# Patient Record
Sex: Male | Born: 1955
Health system: Southern US, Community
[De-identification: ages and names within clinical notes are randomized; demographics above are authoritative.]

## PROBLEM LIST (undated history)

## (undated) ENCOUNTER — Emergency Department (HOSPITAL_COMMUNITY): Admission: EM | Payer: Medicaid Other

## (undated) DIAGNOSIS — M545 Low back pain, unspecified: Secondary | ICD-10-CM

## (undated) DIAGNOSIS — R011 Cardiac murmur, unspecified: Secondary | ICD-10-CM

## (undated) DIAGNOSIS — R51 Headache: Secondary | ICD-10-CM

## (undated) DIAGNOSIS — I739 Peripheral vascular disease, unspecified: Secondary | ICD-10-CM

## (undated) DIAGNOSIS — I169 Hypertensive crisis, unspecified: Secondary | ICD-10-CM

## (undated) DIAGNOSIS — I10 Essential (primary) hypertension: Secondary | ICD-10-CM

## (undated) DIAGNOSIS — R519 Headache, unspecified: Secondary | ICD-10-CM

## (undated) DIAGNOSIS — F1910 Other psychoactive substance abuse, uncomplicated: Secondary | ICD-10-CM

## (undated) DIAGNOSIS — G4733 Obstructive sleep apnea (adult) (pediatric): Secondary | ICD-10-CM

## (undated) DIAGNOSIS — N183 Chronic kidney disease, stage 3 unspecified: Secondary | ICD-10-CM

## (undated) DIAGNOSIS — G8929 Other chronic pain: Secondary | ICD-10-CM

## (undated) DIAGNOSIS — B20 Human immunodeficiency virus [HIV] disease: Secondary | ICD-10-CM

## (undated) DIAGNOSIS — R0600 Dyspnea, unspecified: Secondary | ICD-10-CM

## (undated) DIAGNOSIS — I480 Paroxysmal atrial fibrillation: Secondary | ICD-10-CM

## (undated) DIAGNOSIS — Z9989 Dependence on other enabling machines and devices: Secondary | ICD-10-CM

## (undated) DIAGNOSIS — M109 Gout, unspecified: Secondary | ICD-10-CM

## (undated) DIAGNOSIS — J449 Chronic obstructive pulmonary disease, unspecified: Secondary | ICD-10-CM

## (undated) DIAGNOSIS — I5032 Chronic diastolic (congestive) heart failure: Secondary | ICD-10-CM

## (undated) HISTORY — DX: Human immunodeficiency virus (HIV) disease: B20

## (undated) HISTORY — DX: Paroxysmal atrial fibrillation: I48.0

## (undated) HISTORY — DX: Chronic diastolic (congestive) heart failure: I50.32

## (undated) HISTORY — DX: Hypertensive crisis, unspecified: I16.9

---

## 2001-09-29 ENCOUNTER — Emergency Department (HOSPITAL_COMMUNITY): Admission: EM | Admit: 2001-09-29 | Discharge: 2001-09-29 | Payer: Self-pay | Admitting: Emergency Medicine

## 2001-10-07 ENCOUNTER — Emergency Department (HOSPITAL_COMMUNITY): Admission: EM | Admit: 2001-10-07 | Discharge: 2001-10-07 | Payer: Self-pay | Admitting: Emergency Medicine

## 2001-10-10 ENCOUNTER — Encounter: Admission: RE | Admit: 2001-10-10 | Discharge: 2001-10-10 | Payer: Self-pay | Admitting: Internal Medicine

## 2001-11-19 ENCOUNTER — Emergency Department (HOSPITAL_COMMUNITY): Admission: EM | Admit: 2001-11-19 | Discharge: 2001-11-19 | Payer: Self-pay

## 2001-11-22 ENCOUNTER — Emergency Department (HOSPITAL_COMMUNITY): Admission: EM | Admit: 2001-11-22 | Discharge: 2001-11-22 | Payer: Self-pay | Admitting: Emergency Medicine

## 2001-11-24 ENCOUNTER — Emergency Department (HOSPITAL_COMMUNITY): Admission: EM | Admit: 2001-11-24 | Discharge: 2001-11-25 | Payer: Self-pay | Admitting: Emergency Medicine

## 2001-11-25 ENCOUNTER — Encounter: Payer: Self-pay | Admitting: Emergency Medicine

## 2002-01-15 ENCOUNTER — Encounter: Payer: Self-pay | Admitting: Gastroenterology

## 2002-01-15 ENCOUNTER — Ambulatory Visit (HOSPITAL_COMMUNITY): Admission: RE | Admit: 2002-01-15 | Discharge: 2002-01-15 | Payer: Self-pay | Admitting: Gastroenterology

## 2002-09-09 ENCOUNTER — Emergency Department (HOSPITAL_COMMUNITY): Admission: EM | Admit: 2002-09-09 | Discharge: 2002-09-09 | Payer: Self-pay | Admitting: Emergency Medicine

## 2003-09-04 ENCOUNTER — Emergency Department (HOSPITAL_COMMUNITY): Admission: EM | Admit: 2003-09-04 | Discharge: 2003-09-04 | Payer: Self-pay

## 2003-11-23 ENCOUNTER — Emergency Department (HOSPITAL_COMMUNITY): Admission: EM | Admit: 2003-11-23 | Discharge: 2003-11-23 | Payer: Self-pay | Admitting: Emergency Medicine

## 2004-08-27 DIAGNOSIS — B20 Human immunodeficiency virus [HIV] disease: Secondary | ICD-10-CM | POA: Insufficient documentation

## 2009-05-06 ENCOUNTER — Emergency Department (HOSPITAL_COMMUNITY): Admission: EM | Admit: 2009-05-06 | Discharge: 2009-05-06 | Payer: Self-pay | Admitting: Emergency Medicine

## 2009-05-13 ENCOUNTER — Emergency Department (HOSPITAL_COMMUNITY): Admission: EM | Admit: 2009-05-13 | Discharge: 2009-05-13 | Payer: Self-pay | Admitting: Emergency Medicine

## 2010-12-21 ENCOUNTER — Emergency Department (HOSPITAL_COMMUNITY)
Admission: EM | Admit: 2010-12-21 | Discharge: 2010-12-22 | Disposition: A | Discharge status: Home / Self Care | Payer: Worker's Compensation | Attending: Emergency Medicine | Admitting: Emergency Medicine

## 2010-12-21 ENCOUNTER — Encounter: Payer: Self-pay | Admitting: Emergency Medicine

## 2010-12-21 DIAGNOSIS — W268XXA Contact with other sharp object(s), not elsewhere classified, initial encounter: Secondary | ICD-10-CM | POA: Insufficient documentation

## 2010-12-21 DIAGNOSIS — T148XXA Other injury of unspecified body region, initial encounter: Secondary | ICD-10-CM

## 2010-12-21 DIAGNOSIS — Y990 Civilian activity done for income or pay: Secondary | ICD-10-CM | POA: Insufficient documentation

## 2010-12-21 DIAGNOSIS — S61209A Unspecified open wound of unspecified finger without damage to nail, initial encounter: Secondary | ICD-10-CM | POA: Insufficient documentation

## 2010-12-21 DIAGNOSIS — M79609 Pain in unspecified limb: Secondary | ICD-10-CM | POA: Insufficient documentation

## 2010-12-21 MED ORDER — OXYCODONE-ACETAMINOPHEN 5-325 MG PO TABS: 1.0000 | ORAL_TABLET | ORAL | Status: AC | PRN

## 2010-12-21 MED ORDER — CEPHALEXIN 500 MG PO CAPS: 500.0000 mg | ORAL_CAPSULE | Freq: Four times a day (QID) | ORAL | Status: AC

## 2010-12-21 MED ORDER — OXYCODONE-ACETAMINOPHEN 5-325 MG PO TABS
2.0000 | ORAL_TABLET | Freq: Once | ORAL | Status: AC
  Administered 2010-12-21: 2 via ORAL
  Filled 2010-12-21: qty 2

## 2010-12-21 MED ORDER — ONDANSETRON 4 MG PO TBDP
8.0000 mg | ORAL_TABLET | Freq: Once | ORAL | Status: AC
  Administered 2010-12-21: 8 mg via ORAL
  Filled 2010-12-21: qty 2

## 2010-12-21 NOTE — ED Provider Notes (Signed)
History     CSN: [id number] Arrival date & time: 12/21/2010  5:44 PM   First MD Initiated Contact with Patient 12/21/10 2152      No chief complaint on file.   The history is provided by the patient. No language interpreter was used.    Patient seen and evaluated for puncture wound. Reports that he was at work when he asked that the pain was punctured by a toothpick. The wound is at the IP of the right index finger. Patient was seen and evaluated at the urgent care. They attempted to remove the toothpick. Called Dr. Burney Gauze for further instruction. History  Substance Use Topics  . Smoking status: Not on file  . Smokeless tobacco: Not on file  . Alcohol Use: Not on file      Review of Systems  Constitutional: Negative for fever, chills, diaphoresis, activity change, appetite change and fatigue.  HENT: Negative for neck pain.   Eyes: Negative for photophobia and visual disturbance.  Respiratory: Negative for cough, chest tightness and shortness of breath.   Cardiovascular: Negative for chest pain.  Gastrointestinal: Negative for nausea, vomiting and abdominal pain.  Genitourinary: Negative for flank pain.  Musculoskeletal: Negative for back pain.  Skin: Negative for rash.  Neurological: Negative for weakness and numbness.  All other systems reviewed and are negative.    Allergies  Review of patient's allergies indicates no known allergies.  Home Medications   Current Outpatient Rx  Name Route Sig Dispense Refill  . OVER THE COUNTER MEDICATION Oral Take 1 packet by mouth as needed. BC Powder for pain       BP 196/102  Pulse 56  Temp(Src) 98 F (36.7 C) (Oral)  Resp 18  SpO2 96%  Physical Exam  Nursing note and vitals reviewed. Constitutional: He is oriented to person, place, and time. He appears well-developed and well-nourished. No distress.  HENT:  Head: Normocephalic and atraumatic.  Eyes: EOM are normal. Pupils are equal, round, and reactive to light.    Neck: Normal range of motion. Neck supple.  Cardiovascular: Normal rate and regular rhythm.   Pulmonary/Chest: Effort normal and breath sounds normal.  Musculoskeletal: Normal range of motion. He exhibits tenderness. He exhibits no edema.       Patient tenderness over the open puncture wound to right index fingers. Full sensation. Radial pulse intact. No signs of erythema, warmth, or purulent discharge.   Neurological: He is alert and oriented to person, place, and time.  Skin: Skin is warm and dry. He is not diaphoretic.    ED Course  Procedures (including critical care time)  Patient seen and evaluated.  VSS reviewed. . Nursing notes reviewed. Discussed with attending physician, Dr. Vanessa Kick. Discussed treatment options with physician. Advised against removing the FB at this time. Advised continued plan of having the patient see Dr. Burney Gauze tomorrow.  Initial testing ordered. Will monitor the patient closely. They agree with the treatment plan and diagnosis. No fracture. Dr. Burney Gauze was consulted. Advised follow up in his office tomorrow. Advised starting him on antibiotics. Tetanus updated at the urgent care. Pain medication given in the ED. No foreign body is seen, nothing to remove at this time. Needs to be further managed by orthopedics. X-ray negative for fracture.   12:27 AM patient to get    MDM  Puncture wound        Benson Setting, PA 12/21/10 Page, PA 12/29/10 1511

## 2010-12-21 NOTE — ED Notes (Signed)
Pt st's he has a tooth pick stuck in right index finger, pt was sent here from urgent care where they were unable to remove it

## 2010-12-22 MED ORDER — OXYCODONE-ACETAMINOPHEN 5-325 MG PO TABS
2.0000 | ORAL_TABLET | Freq: Once | ORAL | Status: AC
  Administered 2010-12-22: 2 via ORAL
  Filled 2010-12-22: qty 2

## 2010-12-22 MED ORDER — CEPHALEXIN 250 MG PO CAPS
500.0000 mg | ORAL_CAPSULE | Freq: Once | ORAL | Status: AC
  Administered 2010-12-22: 500 mg via ORAL
  Filled 2010-12-22: qty 2

## 2010-12-22 NOTE — ED Provider Notes (Signed)
Medical screening examination/treatment/procedure(s) were performed by non-physician practitioner and as supervising physician I was immediately available for consultation/collaboration.  Elmer Picker, MD 12/22/10 [phone]

## 2010-12-28 NOTE — ED Notes (Signed)
Medical screening examination/treatment/procedure(s) were performed by non-physician practitioner and as supervising physician I was immediately available for consultation/collaboration.  Elmer Picker, MD 12/28/10 [phone]

## 2010-12-29 NOTE — ED Provider Notes (Signed)
Medical screening examination/treatment/procedure(s) were performed by non-physician practitioner and as supervising physician I was immediately available for consultation/collaboration.  Elmer Picker, MD 12/29/10 [phone]

## 2013-01-16 ENCOUNTER — Encounter (HOSPITAL_COMMUNITY): Payer: Self-pay | Admitting: Emergency Medicine

## 2013-01-16 ENCOUNTER — Emergency Department (HOSPITAL_COMMUNITY)
Admission: EM | Admit: 2013-01-16 | Discharge: 2013-01-17 | Disposition: A | Discharge status: Home / Self Care | Payer: Medicaid Other | Attending: Emergency Medicine | Admitting: Emergency Medicine

## 2013-01-16 ENCOUNTER — Emergency Department (HOSPITAL_COMMUNITY)
Admission: EM | Admit: 2013-01-16 | Discharge: 2013-01-16 | Disposition: A | Discharge status: Home / Self Care | Payer: Medicaid Other | Source: Home / Self Care

## 2013-01-16 DIAGNOSIS — R51 Headache: Secondary | ICD-10-CM | POA: Insufficient documentation

## 2013-01-16 DIAGNOSIS — G44209 Tension-type headache, unspecified, not intractable: Secondary | ICD-10-CM

## 2013-01-16 DIAGNOSIS — I10 Essential (primary) hypertension: Secondary | ICD-10-CM

## 2013-01-16 DIAGNOSIS — D649 Anemia, unspecified: Secondary | ICD-10-CM

## 2013-01-16 DIAGNOSIS — Z79899 Other long term (current) drug therapy: Secondary | ICD-10-CM | POA: Insufficient documentation

## 2013-01-16 DIAGNOSIS — F17200 Nicotine dependence, unspecified, uncomplicated: Secondary | ICD-10-CM | POA: Insufficient documentation

## 2013-01-16 LAB — BASIC METABOLIC PANEL
BUN: 16 mg/dL (ref 6–23)
CO2: 25 mEq/L (ref 19–32)
Chloride: 106 mEq/L (ref 96–112)
GFR calc Af Amer: 84 mL/min — ABNORMAL LOW (ref >90)
GFR calc non Af Amer: 72 mL/min — ABNORMAL LOW (ref >90)

## 2013-01-16 LAB — CBC WITH DIFFERENTIAL/PLATELET
Basophils Relative: 0 % (ref 0–1)
HCT: 33.1 % — ABNORMAL LOW (ref 39.0–52.0)
Hemoglobin: 11.7 g/dL — ABNORMAL LOW (ref 13.0–17.0)
Lymphocytes Relative: 35 % (ref 12–46)
MCH: 32.8 pg (ref 26.0–34.0)
MCHC: 35.3 g/dL (ref 30.0–36.0)
MCV: 92.7 fL (ref 78.0–100.0)
Monocytes Relative: 6 % (ref 3–12)
Neutrophils Relative %: 56 % (ref 43–77)
RDW: 13.8 % (ref 11.5–15.5)

## 2013-01-16 MED ORDER — DIPHENHYDRAMINE HCL 50 MG/ML IJ SOLN
25.0000 mg | Freq: Once | INTRAMUSCULAR | Status: AC
  Administered 2013-01-17: 25 mg via INTRAVENOUS
  Filled 2013-01-16: qty 1

## 2013-01-16 MED ORDER — METOCLOPRAMIDE HCL 5 MG/ML IJ SOLN
10.0000 mg | Freq: Once | INTRAMUSCULAR | Status: AC
  Administered 2013-01-17: 10 mg via INTRAVENOUS
  Filled 2013-01-16: qty 2

## 2013-01-16 MED ORDER — HYDROCHLOROTHIAZIDE 25 MG PO TABS: 25.0000 mg | ORAL_TABLET | Freq: Every day | ORAL | Status: DC

## 2013-01-16 NOTE — ED Provider Notes (Signed)
CSN: [id number]     Arrival date & time 01/16/13  2119 History   First MD Initiated Contact with Patient 01/16/13 2329     Chief Complaint  Patient presents with  . Hypertension  . Headache   (Consider location/radiation/quality/duration/timing/severity/associated sxs/prior Treatment) Patient is a 57 y.o. male presenting with hypertension and headaches. The history is provided by the patient.  Hypertension Associated symptoms include headaches.  Headache He went to a dentist this afternoon for extractions but was told his blood pressure was too high and his blood pressure needed to be controlled prior to having dental work done. He went to urgent care where he was noted to have blood pressure 196/113 and was sent home with prescription for hydrochlorothiazide. His appointment with his new PCP tomorrow morning. He took a dose of hydrochlorothiazide when he got home and he went to sleep woke up with a left sided headache. Headache is dull and throbbing and rates it at 8/10. There is no photophobia or phonophobia. There is no nausea or vomiting. There's no visual change. He continues to have dental pain on the left side which radiates to his jaw and eye but this is separate from his headache. Denies any chest pain, dyspnea. He is a cigarette smoker and also admits to eating excess salt in his diet. He is worried that he is not feeling any better after having taken his new blood pressure pill.  History reviewed. No pertinent past medical history. History reviewed. No pertinent past surgical history. No family history on file. History  Substance Use Topics  . Smoking status: Heavy Tobacco Smoker -- 0.50 packs/day  . Smokeless tobacco: Not on file  . Alcohol Use: Yes     Comment: socially    Review of Systems  Neurological: Positive for headaches.  All other systems reviewed and are negative.    Allergies  Review of patient's allergies indicates no known allergies.  Home Medications    Current Outpatient Rx  Name  Route  Sig  Dispense  Refill  . Aspirin-Salicylamide-Caffeine (BC HEADACHE POWDER PO)   Oral   Take 1 packet by mouth 5 (five) times daily as needed (pain).         . hydrochlorothiazide (HYDRODIURIL) 25 MG tablet   Oral   Take 1 tablet (25 mg total) by mouth daily.   10 tablet   0   . naproxen sodium (ALEVE) 220 MG tablet   Oral   Take 660 mg by mouth 2 (two) times daily as needed (pain).          BP 189/98  Pulse 66  Temp(Src) 98.3 F (36.8 C)  Resp 18  Ht 6' (1.829 m)  Wt 183 lb 12.8 oz (83.371 kg)  BMI 24.92 kg/m2  SpO2 99% Physical Exam  Nursing note and vitals reviewed.  57 year old male, resting comfortably and in no acute distress. Vital signs are significant for hypertension with blood pressure 189/98. Oxygen saturation is 99 %, which is normal. Head is normocephalic and atraumatic. PERRLA, EOMI. Oropharynx is clear. Fundi show no hemorrhage, exudate, or papilledema. There is tenderness to palpation over the left temporalis muscle which does reproduce his headache. Neck is nontender and supple without adenopathy or JVD. There no carotid bruits. Back is nontender and there is no CVA tenderness. Lungs are clear without rales, wheezes, or rhonchi. Chest is nontender. Heart has regular rate and rhythm without murmur. Abdomen is soft, flat, nontender without masses or hepatosplenomegaly and peristalsis is normoactive.  Extremities have no cyanosis or edema, full range of motion is present. Skin is warm and dry without rash. Neurologic: Mental status is normal, cranial nerves are intact, there are no motor or sensory deficits.  ED Course  Procedures (including critical care time) Labs Review Results for orders placed during the hospital encounter of 01/16/13  CBC WITH DIFFERENTIAL      Result Value Range   WBC 7.1  4.0 - 10.5 K/uL   RBC 3.57 (*) 4.22 - 5.81 MIL/uL   Hemoglobin 11.7 (*) 13.0 - 17.0 g/dL   HCT 33.1 (*) 39.0 -  52.0 %   MCV 92.7  78.0 - 100.0 fL   MCH 32.8  26.0 - 34.0 pg   MCHC 35.3  30.0 - 36.0 g/dL   RDW 13.8  11.5 - 15.5 %   Platelets 284  150 - 400 K/uL   Neutrophils Relative % 56  43 - 77 %   Neutro Abs 3.9  1.7 - 7.7 K/uL   Lymphocytes Relative 35  12 - 46 %   Lymphs Abs 2.5  0.7 - 4.0 K/uL   Monocytes Relative 6  3 - 12 %   Monocytes Absolute 0.4  0.1 - 1.0 K/uL   Eosinophils Relative 3  0 - 5 %   Eosinophils Absolute 0.2  0.0 - 0.7 K/uL   Basophils Relative 0  0 - 1 %   Basophils Absolute 0.0  0.0 - 0.1 K/uL  BASIC METABOLIC PANEL      Result Value Range   Sodium 139  135 - 145 mEq/L   Potassium 3.9  3.5 - 5.1 mEq/L   Chloride 106  96 - 112 mEq/L   CO2 25  19 - 32 mEq/L   Glucose, Bld 107 (*) 70 - 99 mg/dL   BUN 16  6 - 23 mg/dL   Creatinine, Ser 1.12  0.50 - 1.35 mg/dL   Calcium 9.2  8.4 - 10.5 mg/dL   GFR calc non Af Amer 72 (*) >90 mL/min   GFR calc Af Amer 84 (*) >90 mL/min  URINALYSIS, ROUTINE W REFLEX MICROSCOPIC      Result Value Range   Color, Urine YELLOW  YELLOW   APPearance CLEAR  CLEAR   Specific Gravity, Urine 1.014  1.005 - 1.030   pH 5.0  5.0 - 8.0   Glucose, UA NEGATIVE  NEGATIVE mg/dL   Hgb urine dipstick NEGATIVE  NEGATIVE   Bilirubin Urine NEGATIVE  NEGATIVE   Ketones, ur NEGATIVE  NEGATIVE mg/dL   Protein, ur NEGATIVE  NEGATIVE mg/dL   Urobilinogen, UA 0.2  0.0 - 1.0 mg/dL   Nitrite NEGATIVE  NEGATIVE   Leukocytes, UA NEGATIVE  NEGATIVE   MDM   1. Hypertension   2. Muscle contraction headache   3. Anemia    Hypertension. Hold records are reviewed and he been seen in the ED for a finger injury 2 years ago and did have elevated blood pressure in a similar range at that time. He probably has long-standing hypertension. Screening labs have been obtained and is normal BUN and creatinine. Urinalysis will be obtained to look for evidence of proteinuria. His headache is most likely muscle contraction headache and he will be given a dose of metoclopramide  with diphenhydramine to see if he gets relief. He has been advised that it is likely to take 1-2 weeks of a new medication to assess its effects and it is likely to need a second antihypertensive given how  high his blood pressure is.  He got excellent relief of his headache with above noted treatment. Repeat blood pressure is accident 159/104. Urinalysis is come back showing no evidence of proteinuria. He is discharged with instructions to follow up with his PCP, stop smoking, and to stay in a low-salt diet.  Delora Fuel, MD [phone]

## 2013-01-16 NOTE — ED Notes (Signed)
Patient states that he went to the dentist today and they discovered that his BP was "high" pt was sent to [facility] and given a prescription for high blood pressure. Patient states that he took todays dose but still feels "bad" and has a "headache." No neuro deficits noted at this time. VSS. Patient A&Ox4.

## 2013-01-16 NOTE — ED Notes (Signed)
C/o blood pressure States he was at the dentist when they tried to do the procedure but his blood pressure was too high

## 2013-01-16 NOTE — ED Notes (Signed)
Pt states he is having high BP tonight, with last reading being 196/113 at urgent care.  Pt was given a script for HCTZ and took one dose around 1500.  Pt now complaining of headache and some dizziness.

## 2013-01-16 NOTE — ED Provider Notes (Signed)
CSN: [id number]     Arrival date & time 01/16/13  1236 History   First MD Initiated Contact with Patient 01/16/13 1310     Chief Complaint  Patient presents with  . Hypertension   (Consider location/radiation/quality/duration/timing/severity/associated sxs/prior Treatment) HPI Comments: 57 year old male is accompanied by his significant other to the urgent care after being referred by his dentist. Prior to the procedure they took his blood pressure and it was elevated. Systolics in the [id number] diastolic [id number]. They would not perform the procedure until his blood pressure was lower. Therefore, they sent to the urgent care for treatment. He denies chest pain, shortness of breath, swelling, headache, orthopnea or other known problems may be associated with hypertension. He has Medicaid and the significant other type to contact the office of that physician but it was during lunch and they were closed.   History reviewed. No pertinent past medical history. History reviewed. No pertinent past surgical history. History reviewed. No pertinent family history. History  Substance Use Topics  . Smoking status: Not on file  . Smokeless tobacco: Not on file  . Alcohol Use: Not on file    Review of Systems  Constitutional: Negative.   HENT: Positive for dental problem.   Eyes: Negative for discharge and redness.  Respiratory: Negative.   Cardiovascular: Negative.   Gastrointestinal: Negative.   Musculoskeletal: Negative for back pain and myalgias.  Skin: Negative for rash.  Neurological: Negative.     Allergies  Review of patient's allergies indicates no known allergies.  Home Medications   Current Outpatient Rx  Name  Route  Sig  Dispense  Refill  . hydrochlorothiazide (HYDRODIURIL) 25 MG tablet   Oral   Take 1 tablet (25 mg total) by mouth daily.   10 tablet   0   . OVER THE COUNTER MEDICATION   Oral   Take 1 packet by mouth as needed. BC Powder for pain           BP 173/105   Pulse 69  Temp(Src) 98.3 F (36.8 C) (Oral)  Resp 18  SpO2 100% Physical Exam  Nursing note and vitals reviewed. Constitutional: He is oriented to person, place, and time. He appears well-developed and well-nourished. No distress.  Eyes: Conjunctivae and EOM are normal.  Neck: Normal range of motion. Neck supple.  Cardiovascular: Normal rate, regular rhythm and normal heart sounds.   Pulmonary/Chest: Effort normal and breath sounds normal. No respiratory distress. He has no wheezes. He has no rales.  Abdominal: Soft. There is no tenderness.  Musculoskeletal: He exhibits no edema and no tenderness.  Lymphadenopathy:    He has no cervical adenopathy.  Neurological: He is alert and oriented to person, place, and time. He exhibits normal muscle tone.  Patient is drowsy and sedated after taking the Valium just prior to his dental procedure.  Skin: Skin is warm and dry.  Psychiatric: He has a normal mood and affect.    ED Course  Procedures (including critical care time) Labs Review Labs Reviewed - No data to display Imaging Review No results found.    MDM   1. HTN (hypertension)       HCTZ 25 mg daily #10. The patient's significant other were advised that there is a risk of adverse effects prescribing blood pressure medicine patient has not had a complete physical to include lab work, EKGs and additional assessments. In addition the blood pressure may drop precipitously or may have little to no effect. Once blood pressure medicine  started there should be followups within just a few days to have blood pressure retaken and medicine adjusted. We are not a primary care center and would not be able to complete this type of workup. I accepting and filling this prescription he would accept the risks they are associated with taking this medication without a proper evaluation and this would be considered a last resort effort to obtain medication. The above has been discussed in detail with  both the patient and his significant other. Call the Dr. on her Medicaid card to make an appointment as soon as possible next week.      Janne Napoleon, NP 01/16/13 1359

## 2013-01-17 LAB — URINALYSIS, ROUTINE W REFLEX MICROSCOPIC
Glucose, UA: NEGATIVE mg/dL
Leukocytes, UA: NEGATIVE
Protein, ur: NEGATIVE mg/dL
Urobilinogen, UA: 0.2 mg/dL (ref 0.0–1.0)
pH: 5 (ref 5.0–8.0)

## 2013-01-17 NOTE — ED Notes (Signed)
Pt given d/c instructions and verbalized understanding. NAD at this time. VS are stable.  

## 2013-01-17 NOTE — ED Provider Notes (Signed)
Medical screening examination/treatment/procedure(s) were performed by a resident physician or non-physician practitioner and as the supervising physician I was immediately available for consultation/collaboration.  Lynne Leader, MD    Gregor Hams, MD 01/17/13 [phone]

## 2014-03-19 ENCOUNTER — Emergency Department (INDEPENDENT_AMBULATORY_CARE_PROVIDER_SITE_OTHER)
Admission: EM | Admit: 2014-03-19 | Discharge: 2014-03-19 | Disposition: A | Discharge status: Home / Self Care | Payer: Medicaid Other | Source: Home / Self Care | Attending: Family Medicine | Admitting: Family Medicine

## 2014-03-19 ENCOUNTER — Encounter (HOSPITAL_COMMUNITY): Payer: Self-pay | Admitting: Emergency Medicine

## 2014-03-19 DIAGNOSIS — M778 Other enthesopathies, not elsewhere classified: Secondary | ICD-10-CM

## 2014-03-19 DIAGNOSIS — R030 Elevated blood-pressure reading, without diagnosis of hypertension: Secondary | ICD-10-CM

## 2014-03-19 DIAGNOSIS — IMO0001 Reserved for inherently not codable concepts without codable children: Secondary | ICD-10-CM

## 2014-03-19 HISTORY — DX: Essential (primary) hypertension: I10

## 2014-03-19 MED ORDER — KETOROLAC TROMETHAMINE 60 MG/2ML IM SOLN
INTRAMUSCULAR | Status: AC
  Filled 2014-03-19: qty 2

## 2014-03-19 MED ORDER — KETOROLAC TROMETHAMINE 60 MG/2ML IM SOLN
60.0000 mg | Freq: Once | INTRAMUSCULAR | Status: AC
  Administered 2014-03-19: 60 mg via INTRAMUSCULAR

## 2014-03-19 MED ORDER — INDOMETHACIN 25 MG PO CAPS: 25.0000 mg | ORAL_CAPSULE | Freq: Three times a day (TID) | ORAL | Status: DC

## 2014-03-19 NOTE — ED Provider Notes (Signed)
CSN: [id number]     Arrival date & time 03/19/14  1044 History   First MD Initiated Contact with Patient 03/19/14 1100     Chief Complaint  Patient presents with  . Hand Pain   (Consider location/radiation/quality/duration/timing/severity/associated sxs/prior Treatment) HPI Comments: Patient presents with acute exacerbation of chronic, intermittent right wrist pain. 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 States he has had episodes similar to current over past >5 years. Current episode began several days ago and has continued to worsen. No reported recent or remote injury or surgery. Endorses that he has a great deal of repetitive motion with wrists and hands while at work each day.   Patient is a 59 y.o. male presenting with hand pain. The history is provided by the patient.  Hand Pain    Past Medical History  Diagnosis Date  . Hypertension    History reviewed. No pertinent past surgical history. No family history on file. History  Substance Use Topics  . Smoking status: Heavy Tobacco Smoker -- 0.50 packs/day  . Smokeless tobacco: Not on file  . Alcohol Use: Yes     Comment: socially    Review of Systems  All other systems reviewed and are negative.   Allergies  Review of patient's allergies indicates no known allergies.  Home Medications   Prior to Admission medications   Medication Sig Start Date End Date Taking? Authorizing Provider  Aspirin-Salicylamide-Caffeine (BC HEADACHE POWDER PO) Take 1 packet by mouth 5 (five) times daily as needed (pain).    Historical Provider, MD  hydrochlorothiazide (HYDRODIURIL) 25 MG tablet Take 1 tablet (25 mg total) by mouth daily. 01/16/13   Janne Napoleon, NP  indomethacin (INDOCIN) 25 MG capsule Take 1 capsule (25 mg total) by mouth 3 (three) times daily with meals. For the next 5 days and then as needed every 8 hours for pain 03/19/14   Audelia Hives Fabian Coca, PA  naproxen sodium (ALEVE) 220 MG tablet Take 660 mg by  mouth 2 (two) times daily as needed (pain).    Historical Provider, MD   BP 174/84 mmHg  Pulse 66  Temp(Src) 98.4 F (36.9 C) (Oral)  Resp 18  SpO2 98% Physical Exam  Constitutional: He is oriented to person, place, and time. He appears well-developed and well-nourished.  HENT:  Head: Normocephalic and atraumatic.  Eyes: Conjunctivae are normal.  Cardiovascular: Normal rate, regular rhythm and normal heart sounds.   Pulmonary/Chest: Effort normal and breath sounds normal.  Musculoskeletal:       Right wrist: He exhibits decreased range of motion, tenderness and swelling. He exhibits no bony tenderness, no effusion, no crepitus, no deformity and no laceration.  CSM exam of right hand intact ROM of right wrist and hand only limited by discomfort  Neurological: He is alert and oriented to person, place, and time.  Skin: Skin is warm and dry.  Psychiatric: He has a normal mood and affect. His behavior is normal.  Nursing note and vitals reviewed.   ED Course  Procedures (including critical care time) Labs Review Labs Reviewed - No data to display  Imaging Review No results found.   MDM   1. Tendonitis of wrist, right   2. Elevated blood pressure   Splint (velcro) provided to patient at [facility] along with ice pack and 60mg  IM injection of toradol for pain. Advised RICE therapy and NSAID (indocin) for home with PCP follow up if no improvement. Also encourage patient to follow up  with PCP for elevated blood pressure.   Lutricia Feil, [name] 03/19/14 [phone]

## 2014-03-19 NOTE — Discharge Instructions (Signed)
Wear splint as needed for comfort. Ice and elevate as much as possible to reduce swelling and discomfort. Pain medication as directed. If symptoms persist or worsen, please follow up with your primary care doctor. You need to also be aware that your blood pressure is elevated and that you should follow up with your primary care doctor as soon as possible to have this addressed.   Tendinitis Tendinitis is swelling and inflammation of the tendons. Tendons are band-like tissues that connect muscle to bone. Tendinitis commonly occurs in the:   Shoulders (rotator cuff).  Heels (Achilles tendon).  Elbows (triceps tendon). CAUSES Tendinitis is usually caused by overusing the tendon, muscles, and joints involved. When the tissue surrounding a tendon (synovium) becomes inflamed, it is called tenosynovitis. Tendinitis commonly develops in people whose jobs require repetitive motions. SYMPTOMS  Pain.  Tenderness.  Mild swelling. DIAGNOSIS Tendinitis is usually diagnosed by physical exam. Your health care provider may also order X-rays or other imaging tests. TREATMENT Your health care provider may recommend certain medicines or exercises for your treatment. HOME CARE INSTRUCTIONS   Use a sling or splint for as long as directed by your health care provider until the pain decreases.  Put ice on the injured area.  Put ice in a plastic bag.  Place a towel between your skin and the bag.  Leave the ice on for 15-20 minutes, 3-4 times a day, or as directed by your health care provider.  Avoid using the limb while the tendon is painful. Perform gentle range of motion exercises only as directed by your health care provider. Stop exercises if pain or discomfort increase, unless directed otherwise by your health care provider.  Only take over-the-counter or prescription medicines for pain, discomfort, or fever as directed by your health care provider. SEEK MEDICAL CARE IF:   Your pain and swelling  increase.  You develop new, unexplained symptoms, especially increased numbness in the hands. MAKE SURE YOU:   Understand these instructions.  Will watch your condition.  Will get help right away if you are not doing well or get worse. Document Released: 01/28/2000 Document Revised: 06/16/2013 Document Reviewed: 04/18/2010 [facility] Patient Information 2015 [place], [place]. This information is not intended to replace advice given to you by your health care provider. Make sure you discuss any questions you have with your health care provider.  Flexor Digitorum Profundus Rupture ([place] Finger) Flexor digitorum profundus rupture, commonly called [place] finger, is a condition where a person is unable to bend his or her own finger, without assistance. This is caused by injury to the tendon that bends the last joint of the finger. The tendon tears (ruptures), and sometimes pulls a piece of bone off with it. This is called an avulsion fracture. The injury is called [place] finger, because it often occurs when a player grabs another player's [place] or pants. SYMPTOMS   A "pop" or rip felt in the finger, at the time of injury.  Pain when moving the finger.  Inability to bend the finger, without assistance.  Full passive motion of the finger (can be bent with help).  Tenderness, swelling, and warmth of the injured finger.  Bruising after 48 hours.  Sometimes, a lump felt in the palm of the hand. CAUSES  The most common cause is forced straightening (extension) of a bent (flexed) finger. The force on the tendon is greater than it can withstand, causing it to rupture. [place] finger is less commonly the result of a cut (laceration). RISK  INCREASES WITH:  Sports that involve grasping an object, such as a [place] (rugby, football (during tackling), and ice hockey when gloves are removed and a player grabs another player's [place]).  Poor hand strength and flexibility.  Previous finger  injury. PREVENTION   Warm up and stretch properly before activity.  Maintain physical fitness:  Hand and finger flexibility.  Muscle strength and endurance.  Taping, splinting, or protective strapping may be advised before activity.  Learn and use proper tackling technique. PROGNOSIS  [place] finger often requires surgery, followed by a 3 month recovery. RELATED COMPLICATIONS   Permanent deformity (inability to bend finger).  Stiffness of finger.  If untreated, unstable last joint.  Poor finger function.  Repeated rupture of the tendon.  Pain or weakness with gripping.  Prolonged disability.  Arthritis of the finger, especially if associated with a fracture.  Risks of surgery: infection, injury to nerves (numbness, weakness), bleeding, weakness, recurring tendon injury, finger stiffness. TREATMENT  [place] finger usually requires surgery. Before surgery, treatment involves restraint and ice and medicine, to reduce pain and inflammation. Surgery will involve reattaching the tendon to the bone. After surgery, the hand is restrained for protection during the healing process. Stretching and strengthening exercises will be needed, for you to regain strength and a full range of motion. Exercises may be completed at home or with a therapist. MEDICATION   If pain medicine is needed, nonsteroidal anti-inflammatory medicines (aspirin and ibuprofen), or other minor pain relievers (acetaminophen), are often advised.  Do not take pain medicine for 7 days before surgery.  Prescription pain relievers may be given. Use only as directed and only as much as you need. SEEK MEDICAL CARE IF:   Pain increases, despite treatment.  Any of the following occur after surgery:  You experience pain, numbness, or coldness in the finger.  Blue, gray, or dark color appears in the fingernails.  You develop signs of infection: fever, increased pain, swelling, redness, drainage of fluids, or  bleeding in the affected area.  New, unexplained symptoms develop. (Drugs used in treatment may produce side effects.) Document Released: 01/30/2005 Document Revised: 04/24/2011 Document Reviewed: 05/14/2008 [facility] Patient Information 2015 [place], [place]. This information is not intended to replace advice given to you by your health care provider. Make sure you discuss any questions you have with your health care provider.

## 2014-03-19 NOTE — ED Notes (Signed)
Pain in right forearm/wrist/hand.  Patient has c/o pain in ulnar aspect of right forearm.  Reports fingers tingling, reports shooting sharp pain in right arm with movement.  Patient is right handed and "flipping a spatula" for 30 years.

## 2014-11-24 ENCOUNTER — Emergency Department (HOSPITAL_COMMUNITY)
Admission: EM | Admit: 2014-11-24 | Discharge: 2014-11-24 | Disposition: A | Discharge status: Home / Self Care | Payer: Medicaid Other | Attending: Emergency Medicine | Admitting: Emergency Medicine

## 2014-11-24 ENCOUNTER — Encounter (HOSPITAL_COMMUNITY): Payer: Self-pay | Admitting: Emergency Medicine

## 2014-11-24 ENCOUNTER — Emergency Department (HOSPITAL_COMMUNITY): Payer: Medicaid Other

## 2014-11-24 DIAGNOSIS — M25572 Pain in left ankle and joints of left foot: Secondary | ICD-10-CM | POA: Diagnosis not present

## 2014-11-24 DIAGNOSIS — M546 Pain in thoracic spine: Secondary | ICD-10-CM | POA: Diagnosis not present

## 2014-11-24 DIAGNOSIS — R52 Pain, unspecified: Secondary | ICD-10-CM

## 2014-11-24 DIAGNOSIS — Z720 Tobacco use: Secondary | ICD-10-CM | POA: Diagnosis not present

## 2014-11-24 DIAGNOSIS — Z79899 Other long term (current) drug therapy: Secondary | ICD-10-CM | POA: Insufficient documentation

## 2014-11-24 DIAGNOSIS — I10 Essential (primary) hypertension: Secondary | ICD-10-CM | POA: Insufficient documentation

## 2014-11-24 MED ORDER — PREDNISONE 20 MG PO TABS
60.0000 mg | ORAL_TABLET | Freq: Once | ORAL | Status: AC
  Administered 2014-11-24: 60 mg via ORAL
  Filled 2014-11-24: qty 3

## 2014-11-24 MED ORDER — OXYCODONE-ACETAMINOPHEN 5-325 MG PO TABS: 1.0000 | ORAL_TABLET | ORAL | Status: DC | PRN

## 2014-11-24 MED ORDER — IBUPROFEN 800 MG PO TABS: 800.0000 mg | ORAL_TABLET | Freq: Three times a day (TID) | ORAL | Status: DC | PRN

## 2014-11-24 MED ORDER — METHYLPREDNISOLONE 4 MG PO TBPK: ORAL_TABLET | ORAL | Status: DC

## 2014-11-24 MED ORDER — IBUPROFEN 800 MG PO TABS
800.0000 mg | ORAL_TABLET | Freq: Once | ORAL | Status: AC
  Administered 2014-11-24: 800 mg via ORAL
  Filled 2014-11-24: qty 1

## 2014-11-24 MED ORDER — METHOCARBAMOL 500 MG PO TABS: 500.0000 mg | ORAL_TABLET | Freq: Three times a day (TID) | ORAL | Status: DC | PRN

## 2014-11-24 MED ORDER — METHOCARBAMOL 500 MG PO TABS
500.0000 mg | ORAL_TABLET | Freq: Once | ORAL | Status: AC
  Administered 2014-11-24: 500 mg via ORAL
  Filled 2014-11-24: qty 1

## 2014-11-24 MED ORDER — OXYCODONE-ACETAMINOPHEN 5-325 MG PO TABS
2.0000 | ORAL_TABLET | Freq: Once | ORAL | Status: AC
Start: 2014-11-24 — End: 2014-11-24
  Administered 2014-11-24: 2 via ORAL
  Filled 2014-11-24: qty 2

## 2014-11-24 NOTE — ED Notes (Signed)
Pt. reports mid back pain with dry cough onset 3 days , pain increases with movement , lying and changing positions . Denies fever or chills.

## 2014-11-24 NOTE — ED Provider Notes (Signed)
TIME SEEN: 4:33 AM  CHIEF COMPLAINT: left back pain, right resolved wrist pain, left ankle pain  HPI: Pt is a 59 y.o. male with history of hypertension and gout who presents emergency department with complaints of 3 days of sharp pain worse with movement to the left posterior scapular area, right wrist pain that has now resolved and now left ankle pain. No known injury. Has had some swelling in his right wrist and left ankle but no erythema or warmth. No numbness, tingling or focal weakness. No bowel or bladder incontinence. Patient has had a hard time walking because of pain in his left ankle. States this feels similar to his prior episodes of gout. Denies any fever.  ROS: See HPI Constitutional: no fever  Eyes: no drainage  ENT: no runny nose   Cardiovascular:  no chest pain  Resp: no SOB  GI: no vomiting GU: no dysuria Integumentary: no rash  Allergy: no hives  Musculoskeletal: no leg swelling  Neurological: no slurred speech ROS otherwise negative  PAST MEDICAL HISTORY/PAST SURGICAL HISTORY:  Past Medical History  Diagnosis Date  . Hypertension     MEDICATIONS:  Prior to Admission medications   Medication Sig Start Date End Date Taking? Authorizing Provider  Aspirin-Salicylamide-Caffeine (BC HEADACHE POWDER PO) Take 1 packet by mouth 5 (five) times daily as needed (pain).    Historical Provider, MD  hydrochlorothiazide (HYDRODIURIL) 25 MG tablet Take 1 tablet (25 mg total) by mouth daily. 01/16/13   Janne Napoleon, NP  indomethacin (INDOCIN) 25 MG capsule Take 1 capsule (25 mg total) by mouth 3 (three) times daily with meals. For the next 5 days and then as needed every 8 hours for pain 03/19/14   Audelia Hives Presson, PA  naproxen sodium (ALEVE) 220 MG tablet Take 660 mg by mouth 2 (two) times daily as needed (pain).    Historical Provider, MD    ALLERGIES:  No Known Allergies  SOCIAL HISTORY:  Social History  Substance Use Topics  . Smoking status: Heavy Tobacco Smoker --  0.50 packs/day  . Smokeless tobacco: Not on file  . Alcohol Use: Yes     Comment: socially    FAMILY HISTORY: No family history on file.  EXAM: BP 160/90 mmHg  Pulse 79  Temp(Src) 99.1 F (37.3 C) (Oral)  Resp 16  SpO2 98% CONSTITUTIONAL: Alert and oriented and responds appropriately to questions. Well-appearing; well-nourished HEAD: Normocephalic EYES: Conjunctivae clear, PERRL ENT: normal nose; no rhinorrhea; moist mucous membranes; pharynx without lesions noted NECK: Supple, no meningismus, no LAD  CARD: RRR; S1 and S2 appreciated; no murmurs, no clicks, no rubs, no gallops RESP: Normal chest excursion without splinting or tachypnea; breath sounds clear and equal bilaterally; no wheezes, no rhonchi, no rales, no hypoxia or respiratory distress, speaking full sentences ABD/GI: Normal bowel sounds; non-distended; soft, non-tender, no rebound, no guarding, no peritoneal signs BACK:  The back appears normal and is tender to palpation over the left rhomboid and trapezius musculature without bony deformity of the scapula and full range of motion in the left shoulder, no midline spinal tenderness or step-off or deformity, there is no CVA tenderness EXT: Patient is tender to palpation over the left ankle mostly over the medial malleolus without erythema, warmth. He does have some swelling of this joint. No sign of bony deformity. No ligamentous laxity. Also reports recent pain to the right wrist but no tenderness on exam and no bony deformity or joint effusion. Normal ROM in all joints; otherwise  extremities are non-tender to palpation; no edema; normal capillary refill; no cyanosis, no calf tenderness or swelling, 2+ radial and DP pulses bilaterally    SKIN: Normal color for age and race; warm NEURO: Moves all extremities equally, sensation to light touch intact diffusely, cranial nerves II through XII intact PSYCH: The patient's mood and manner are appropriate. Grooming and personal hygiene  are appropriate.  MEDICAL DECISION MAKING: Patient here with back pain, resolved right wrist pain and left ankle pain. Suspect his back pain is from muscle strain. Doubt this is cardiac in nature or pulmonary embolus or dissection. He is hemodynamically stable. Pain completely reproducible with movement and palpation. Patient also having pain in his left ankle and recently and his right wrist. States he has had gout and this feels similar. No signs of septic arthritis on exam. Patient is neurovascularly intact distally. Will treat with Percocet, ibuprofen, Robaxin and prednisone. Given he does have bony tenderness to the left ankle will obtain x-ray for further evaluation.  ED PROGRESS: X-ray shows no acute abnormality. Patient reports pain has markedly improved after above medications. We'll discharge with prescriptions for the same. Have provided him with outpatient follow-up information. Discussed return precautions. I do not feel he needs further emergent workup at this time. He verbalizes understanding and is comfortable with this plan.     Alexander, DO 11/25/14 [phone]

## 2014-11-24 NOTE — Discharge Instructions (Signed)
Back Pain, Adult °Back pain is very common in adults. The cause of back pain is rarely dangerous and the pain often gets better over time. The cause of your back pain may not be known. Some common causes of back pain include: °· Strain of the muscles or ligaments supporting the spine. °· Wear and tear (degeneration) of the spinal disks. °· Arthritis. °· Direct injury to the back. °For many people, back pain may return. Since back pain is rarely dangerous, most people can learn to manage this condition on their own. °HOME CARE INSTRUCTIONS °Watch your back pain for any changes. The following actions may help to lessen any discomfort you are feeling: °· Remain active. It is stressful on your back to sit or stand in one place for long periods of time. Do not sit, drive, or stand in one place for more than 30 minutes at a time. Take short walks on even surfaces as soon as you are able. Try to increase the length of time you walk each day. °· Exercise regularly as directed by your health care provider. Exercise helps your back heal faster. It also helps avoid future injury by keeping your muscles strong and flexible. °· Do not stay in bed. Resting more than 1-2 days can delay your recovery. °· Pay attention to your body when you bend and lift. The most comfortable positions are those that put less stress on your recovering back. Always use proper lifting techniques, including: °· Bending your knees. °· Keeping the load close to your body. °· Avoiding twisting. °· Find a comfortable position to sleep. Use a firm mattress and lie on your side with your knees slightly bent. If you lie on your back, put a pillow under your knees. °· Avoid feeling anxious or stressed. Stress increases muscle tension and can worsen back pain. It is important to recognize when you are anxious or stressed and learn ways to manage it, such as with exercise. °· Take medicines only as directed by your health care provider. Over-the-counter  medicines to reduce pain and inflammation are often the most helpful. Your health care provider may prescribe muscle relaxant drugs. These medicines help dull your pain so you can more quickly return to your normal activities and healthy exercise. °· Apply ice to the injured area: °· Put ice in a plastic bag. °· Place a towel between your skin and the bag. °· Leave the ice on for 20 minutes, 2-3 times a day for the first 2-3 days. After that, ice and heat may be alternated to reduce pain and spasms. °· Maintain a healthy weight. Excess weight puts extra stress on your back and makes it difficult to maintain good posture. °SEEK MEDICAL CARE IF: °· You have pain that is not relieved with rest or medicine. °· You have increasing pain going down into the legs or buttocks. °· You have pain that does not improve in one week. °· You have night pain. °· You lose weight. °· You have a fever or chills. °SEEK IMMEDIATE MEDICAL CARE IF:  °· You develop new bowel or bladder control problems. °· You have unusual weakness or numbness in your arms or legs. °· You develop nausea or vomiting. °· You develop abdominal pain. °· You feel faint. °  °This information is not intended to replace advice given to you by your health care provider. Make sure you discuss any questions you have with your health care provider. °  °Document Released: 01/30/2005 Document Revised: 02/20/2014 Document Reviewed: 06/03/2013 °Elsevier Interactive Patient Education ©2016 [employer]  Possible Early Gout Gout is an inflammatory arthritis caused by a buildup of uric acid crystals in the joints. Uric acid is a chemical that is normally present in the blood. When the level of uric acid in the blood is too high it can form crystals that deposit in your joints and tissues. This causes joint redness, soreness, and swelling (inflammation). Repeat attacks are common. Over time, uric acid crystals can form into masses (tophi) near a joint, destroying bone and  causing disfigurement. Gout is treatable and often preventable. CAUSES  The disease begins with elevated levels of uric acid in the blood. Uric acid is produced by your body when it breaks down a naturally found substance called purines. Certain foods you eat, such as meats and fish, contain high amounts of purines. Causes of an elevated uric acid level include:  Being passed down from parent to child (heredity).  Diseases that cause increased uric acid production (such as obesity, psoriasis, and certain cancers).  Excessive alcohol use.  Diet, especially diets rich in meat and seafood.  Medicines, including certain cancer-fighting medicines (chemotherapy), water pills (diuretics), and aspirin.  Chronic kidney disease. The kidneys are no longer able to remove uric acid well.  Problems with metabolism. Conditions strongly associated with gout include:  Obesity.  High blood pressure.  High cholesterol.  Diabetes. Not everyone with elevated uric acid levels gets gout. It is not understood why some people get gout and others do not. Surgery, joint injury, and eating too much of certain foods are some of the factors that can lead to gout attacks. SYMPTOMS   An attack of gout comes on quickly. It causes intense pain with redness, swelling, and warmth in a joint.  Fever can occur.  Often, only one joint is involved. Certain joints are more commonly involved:  Base of the big toe.  Knee.  Ankle.  Wrist.  Finger. Without treatment, an attack usually goes away in a few days to weeks. Between attacks, you usually will not have symptoms, which is different from many other forms of arthritis. DIAGNOSIS  Your caregiver will suspect gout based on your symptoms and exam. In some cases, tests may be recommended. The tests may include:  Blood tests.  Urine tests.  X-rays.  Joint fluid exam. This exam requires a needle to remove fluid from the joint (arthrocentesis). Using a  microscope, gout is confirmed when uric acid crystals are seen in the joint fluid. TREATMENT  There are two phases to gout treatment: treating the sudden onset (acute) attack and preventing attacks (prophylaxis).  Treatment of an Acute Attack.  Medicines are used. These include anti-inflammatory medicines or steroid medicines.  An injection of steroid medicine into the affected joint is sometimes necessary.  The painful joint is rested. Movement can worsen the arthritis.  You may use warm or cold treatments on painful joints, depending which works best for you.  Treatment to Prevent Attacks.  If you suffer from frequent gout attacks, your caregiver may advise preventive medicine. These medicines are started after the acute attack subsides. These medicines either help your kidneys eliminate uric acid from your body or decrease your uric acid production. You may need to stay on these medicines for a very long time.  The early phase of treatment with preventive medicine can be associated with an increase in acute gout attacks. For this reason, during the first few months of treatment, your caregiver may also advise you to take medicines usually used for acute  gout treatment. Be sure you understand your caregiver's directions. Your caregiver may make several adjustments to your medicine dose before these medicines are effective.  Discuss dietary treatment with your caregiver or dietitian. Alcohol and drinks high in sugar and fructose and foods such as meat, poultry, and seafood can increase uric acid levels. Your caregiver or dietitian can advise you on drinks and foods that should be limited. HOME CARE INSTRUCTIONS   Do not take aspirin to relieve pain. This raises uric acid levels.  Only take over-the-counter or prescription medicines for pain, discomfort, or fever as directed by your caregiver.  Rest the joint as much as possible. When in bed, keep sheets and blankets off painful  areas.  Keep the affected joint raised (elevated).  Apply warm or cold treatments to painful joints. Use of warm or cold treatments depends on which works best for you.  Use crutches if the painful joint is in your leg.  Drink enough fluids to keep your urine clear or pale yellow. This helps your body get rid of uric acid. Limit alcohol, sugary drinks, and fructose drinks.  Follow your dietary instructions. Pay careful attention to the amount of protein you eat. Your daily diet should emphasize fruits, vegetables, whole grains, and fat-free or low-fat milk products. Discuss the use of coffee, vitamin C, and cherries with your caregiver or dietitian. These may be helpful in lowering uric acid levels.  Maintain a healthy body weight. SEEK MEDICAL CARE IF:   You develop diarrhea, vomiting, or any side effects from medicines.  You do not feel better in 24 hours, or you are getting worse. SEEK IMMEDIATE MEDICAL CARE IF:   Your joint becomes suddenly more tender, and you have chills or a fever. MAKE SURE YOU:   Understand these instructions.  Will watch your condition.  Will get help right away if you are not doing well or get worse.   This information is not intended to replace advice given to you by your health care provider. Make sure you discuss any questions you have with your health care provider.   Document Released: 01/28/2000 Document Revised: 02/20/2014 Document Reviewed: 09/13/2011 Elsevier Interactive Patient Education [employer].

## 2015-07-15 ENCOUNTER — Emergency Department (HOSPITAL_COMMUNITY)
Admission: EM | Admit: 2015-07-15 | Discharge: 2015-07-15 | Disposition: A | Discharge status: Home / Self Care | Payer: Medicaid Other | Attending: Emergency Medicine | Admitting: Emergency Medicine

## 2015-07-15 ENCOUNTER — Emergency Department (HOSPITAL_COMMUNITY): Payer: Medicaid Other

## 2015-07-15 ENCOUNTER — Encounter (HOSPITAL_COMMUNITY): Payer: Self-pay

## 2015-07-15 DIAGNOSIS — Z791 Long term (current) use of non-steroidal anti-inflammatories (NSAID): Secondary | ICD-10-CM | POA: Insufficient documentation

## 2015-07-15 DIAGNOSIS — R1030 Lower abdominal pain, unspecified: Secondary | ICD-10-CM | POA: Diagnosis present

## 2015-07-15 DIAGNOSIS — F17200 Nicotine dependence, unspecified, uncomplicated: Secondary | ICD-10-CM | POA: Insufficient documentation

## 2015-07-15 DIAGNOSIS — R1084 Generalized abdominal pain: Secondary | ICD-10-CM

## 2015-07-15 DIAGNOSIS — Z79891 Long term (current) use of opiate analgesic: Secondary | ICD-10-CM | POA: Diagnosis not present

## 2015-07-15 DIAGNOSIS — R634 Abnormal weight loss: Secondary | ICD-10-CM | POA: Insufficient documentation

## 2015-07-15 DIAGNOSIS — I10 Essential (primary) hypertension: Secondary | ICD-10-CM | POA: Insufficient documentation

## 2015-07-15 DIAGNOSIS — Z79899 Other long term (current) drug therapy: Secondary | ICD-10-CM | POA: Diagnosis not present

## 2015-07-15 LAB — COMPREHENSIVE METABOLIC PANEL
ALBUMIN: 3.1 g/dL — AB (ref 3.5–5.0)
ALT: 11 U/L — ABNORMAL LOW (ref 17–63)
AST: 19 U/L (ref 15–41)
Alkaline Phosphatase: 57 U/L (ref 38–126)
Anion gap: 4 — ABNORMAL LOW (ref 5–15)
BUN: 13 mg/dL (ref 6–20)
CHLORIDE: 108 mmol/L (ref 101–111)
CO2: 25 mmol/L (ref 22–32)
Calcium: 8.9 mg/dL (ref 8.9–10.3)
Creatinine, Ser: 1.42 mg/dL — ABNORMAL HIGH (ref 0.61–1.24)
GFR, EST NON AFRICAN AMERICAN: 53 mL/min — AB (ref >60)
Glucose, Bld: 99 mg/dL (ref 65–99)
POTASSIUM: 4 mmol/L (ref 3.5–5.1)
Sodium: 137 mmol/L (ref 135–145)
Total Bilirubin: 0.7 mg/dL (ref 0.3–1.2)
Total Protein: 8.5 g/dL — ABNORMAL HIGH (ref 6.5–8.1)

## 2015-07-15 LAB — CBC
HEMATOCRIT: 32.2 % — AB (ref 39.0–52.0)
Hemoglobin: 10.7 g/dL — ABNORMAL LOW (ref 13.0–17.0)
MCH: 29.6 pg (ref 26.0–34.0)
MCHC: 33.2 g/dL (ref 30.0–36.0)
MCV: 89 fL (ref 78.0–100.0)
PLATELETS: 309 10*3/uL (ref 150–400)
RBC: 3.62 MIL/uL — AB (ref 4.22–5.81)
RDW: 13.9 % (ref 11.5–15.5)
WBC: 8.4 10*3/uL (ref 4.0–10.5)

## 2015-07-15 LAB — URINALYSIS, ROUTINE W REFLEX MICROSCOPIC
BILIRUBIN URINE: NEGATIVE
GLUCOSE, UA: NEGATIVE mg/dL
Hgb urine dipstick: NEGATIVE
KETONES UR: NEGATIVE mg/dL
Leukocytes, UA: NEGATIVE
Nitrite: NEGATIVE
PH: 5.5 (ref 5.0–8.0)
Protein, ur: NEGATIVE mg/dL
Specific Gravity, Urine: 1.017 (ref 1.005–1.030)

## 2015-07-15 LAB — LIPASE, BLOOD: LIPASE: 39 U/L (ref 11–51)

## 2015-07-15 MED ORDER — ACETAMINOPHEN 325 MG PO TABS
650.0000 mg | ORAL_TABLET | Freq: Once | ORAL | Status: AC
  Administered 2015-07-15: 650 mg via ORAL
  Filled 2015-07-15: qty 2

## 2015-07-15 MED ORDER — FAMOTIDINE 20 MG PO TABS: 20.0000 mg | ORAL_TABLET | Freq: Two times a day (BID) | ORAL | Status: DC

## 2015-07-15 MED ORDER — GI COCKTAIL ~~LOC~~
30.0000 mL | Freq: Once | ORAL | Status: AC
  Administered 2015-07-15: 30 mL via ORAL
  Filled 2015-07-15: qty 30

## 2015-07-15 MED ORDER — IOPAMIDOL (ISOVUE-300) INJECTION 61%
INTRAVENOUS | Status: AC
  Administered 2015-07-15: 100 mL
  Filled 2015-07-15: qty 100

## 2015-07-15 MED ORDER — PANTOPRAZOLE SODIUM 40 MG PO TBEC: 40.0000 mg | DELAYED_RELEASE_TABLET | Freq: Every day | ORAL | Status: DC

## 2015-07-15 MED ORDER — SODIUM CHLORIDE 0.9 % IV BOLUS (SEPSIS)
1000.0000 mL | Freq: Once | INTRAVENOUS | Status: AC
  Administered 2015-07-15: 1000 mL via INTRAVENOUS

## 2015-07-15 NOTE — ED Notes (Signed)
Patient transported to CT 

## 2015-07-15 NOTE — ED Notes (Signed)
Patient here with 7-10 days of generalized abdominal pain. No nausea, no vomiting, no diarrhea with same. Pain constant and reports increased gas with same.

## 2015-07-15 NOTE — ED Provider Notes (Signed)
CSN: [id number]     Arrival date & time 07/15/15  1004 History   First MD Initiated Contact with Patient 07/15/15 1040     Chief Complaint  Patient presents with  . Abdominal Pain     (Consider location/radiation/quality/duration/timing/severity/associated sxs/prior Treatment) HPI 60 year old male presents with abdominal pain. States it is both lower and upper. Has been going on for at least the past 7 days. Pain seems to be constant. Worsens with any type of ingestion even water. Seems to worsen immediately. The pain is occasionally sharp. No radiation of the pain to the back. Has had some nausea and gagging but no vomiting. No change in bowel movements. Has not taken anything for the pain. Patient states that he's lost 12 pounds over the last 2 weeks and feels like he doesn't have as much of an appetite. Patient uses alcohol intermittently but not daily or heavily. Patient is a smoker.  Past Medical History  Diagnosis Date  . Hypertension    History reviewed. No pertinent past surgical history. No family history on file. Social History  Substance Use Topics  . Smoking status: Heavy Tobacco Smoker -- 0.50 packs/day  . Smokeless tobacco: None  . Alcohol Use: Yes     Comment: socially    Review of Systems  Constitutional: Negative for fever.  Respiratory: Negative for shortness of breath.   Cardiovascular: Negative for chest pain.  Gastrointestinal: Positive for nausea and abdominal pain. Negative for vomiting, diarrhea, constipation and blood in stool.  Genitourinary: Negative for dysuria.  Musculoskeletal: Positive for back pain (chronic back pain not worse with the abd pain).  All other systems reviewed and are negative.     Allergies  Review of patient's allergies indicates no known allergies.  Home Medications   Prior to Admission medications   Medication Sig Start Date End Date Taking? Authorizing Provider  ibuprofen (ADVIL,MOTRIN) 800 MG tablet Take 1 tablet (800 mg  total) by mouth every 8 (eight) hours as needed for mild pain. 11/24/14   Kristen N Ward, DO  methocarbamol (ROBAXIN) 500 MG tablet Take 1 tablet (500 mg total) by mouth every 8 (eight) hours as needed for muscle spasms. 11/24/14   Kristen N Ward, DO  methylPREDNISolone (MEDROL DOSEPAK) 4 MG TBPK tablet Take as directed 11/24/14   Kristen N Ward, DO  oxyCODONE-acetaminophen (PERCOCET/ROXICET) 5-325 MG tablet Take 1 tablet by mouth every 4 (four) hours as needed. 11/24/14   Kristen N Ward, DO   BP 190/100 mmHg  Pulse 52  Temp(Src) 98.4 F (36.9 C) (Oral)  Resp 18  Ht 6' (1.829 m)  Wt 173 lb 14.4 oz (78.881 kg)  BMI 23.58 kg/m2  SpO2 100% Physical Exam  Constitutional: He is oriented to person, place, and time. He appears well-developed and well-nourished. No distress.  HENT:  Head: Normocephalic and atraumatic.  Right Ear: External ear normal.  Left Ear: External ear normal.  Nose: Nose normal.  Eyes: Right eye exhibits no discharge. Left eye exhibits no discharge.  Neck: Neck supple.  Cardiovascular: Normal rate, regular rhythm, normal heart sounds and intact distal pulses.   Pulmonary/Chest: Effort normal and breath sounds normal.  Abdominal: Soft. He exhibits no distension. There is tenderness (diffuse, mild abdominal tenderness, no focal point of pain).  Musculoskeletal: He exhibits no edema.  Neurological: He is alert and oriented to person, place, and time.  Skin: Skin is warm and dry. He is not diaphoretic.  Nursing note and vitals reviewed.   ED Course  Procedures (  including critical care time) Labs Review Labs Reviewed  COMPREHENSIVE METABOLIC PANEL - Abnormal; Notable for the following:    Creatinine, Ser 1.42 (*)    Total Protein 8.5 (*)    Albumin 3.1 (*)    ALT 11 (*)    GFR calc non Af Amer 53 (*)    Anion gap 4 (*)    All other components within normal limits  CBC - Abnormal; Notable for the following:    RBC 3.62 (*)    Hemoglobin 10.7 (*)    HCT 32.2 (*)     All other components within normal limits  URINALYSIS, ROUTINE W REFLEX MICROSCOPIC (NOT AT [facility]) - Abnormal; Notable for the following:    APPearance HAZY (*)    All other components within normal limits  LIPASE, BLOOD    Imaging Review Ct Abdomen Pelvis W Contrast  07/15/2015  CLINICAL DATA:  Mid abdominal pain with dry heaves.  Weight loss. EXAM: CT ABDOMEN AND PELVIS WITH CONTRAST TECHNIQUE: Multidetector CT imaging of the abdomen and pelvis was performed using the standard protocol following bolus administration of intravenous contrast. CONTRAST:  129mL ISOVUE-300 IOPAMIDOL (ISOVUE-300) INJECTION 61% COMPARISON:  None. FINDINGS: Lower chest: Clear lung bases. Normal heart size without pericardial or pleural effusion. Hepatobiliary: Normal liver. Normal gallbladder, without biliary ductal dilatation. Pancreas: Normal, without mass or ductal dilatation. Spleen: Normal in size, without focal abnormality. Adrenals/Urinary Tract: Normal adrenal glands. Normal kidneys, without hydronephrosis. Normal urinary bladder. Stomach/Bowel: Underdistention of the gastric body and antrum. No gross abnormality identified. Normal colon and terminal ileum. Normal appendix, including on image 55/series 2. Normal small bowel. Vascular/Lymphatic: Aortic and branch vessel atherosclerosis. No retroperitoneal or retrocrural adenopathy. Borderline enlarged right external iliac node measures 10 mm on image 71/series 2. Reproductive: Normal prostate. Other: No significant free fluid. 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 No acute osseous abnormality. IMPRESSION: 1.  No acute process in the abdomen or pelvis. 2. Borderline enlarged right external iliac node, most likely reactive. If there is any history of primary malignancy, consider pelvic CT followup at 3-6 months to confirm stability or regression. 3. Atherosclerosis. Electronically Signed   By: Abigail Miyamoto M.D.   On: 07/15/2015  12:54   I have personally reviewed and evaluated these images and lab results as part of my medical decision-making.   EKG Interpretation None      MDM   Final diagnoses:  Generalized abdominal pain    Patient's exam is nonfocal. Given age and weight loss a CT was obtained. He has a borderline-enlarged external iliac node but no other findings. No acute CT findings would explain abdominal pain. He will be treated for possible gastric etiology given that his pain sig never really worsens after eating. Patient made aware of the lymph node and the need for a PCP and follow-up imaging in 3 months. Discussed return precautions. However I have low suspicion of serious intra-abdominal pathology.    [name], MD 07/15/15 1622

## 2015-07-15 NOTE — Discharge Instructions (Signed)
Your Right external iliac lymph node is borderline enlarged. You need to have this followed up by a primary doctor and get a repeat CT scan in about 3 months.

## 2016-01-14 DIAGNOSIS — I480 Paroxysmal atrial fibrillation: Secondary | ICD-10-CM

## 2016-01-14 DIAGNOSIS — I5032 Chronic diastolic (congestive) heart failure: Secondary | ICD-10-CM

## 2016-01-14 HISTORY — DX: Paroxysmal atrial fibrillation: I48.0

## 2016-01-14 HISTORY — DX: Chronic diastolic (congestive) heart failure: I50.32

## 2016-02-07 ENCOUNTER — Inpatient Hospital Stay (HOSPITAL_COMMUNITY)
Admission: EM | Admit: 2016-02-07 | Discharge: 2016-02-10 | DRG: 305 | Disposition: A | Discharge status: Home / Self Care | Payer: Medicaid Other | Attending: Internal Medicine | Admitting: Internal Medicine

## 2016-02-07 ENCOUNTER — Encounter (HOSPITAL_COMMUNITY): Payer: Self-pay | Admitting: Emergency Medicine

## 2016-02-07 ENCOUNTER — Emergency Department (HOSPITAL_COMMUNITY): Payer: Medicaid Other

## 2016-02-07 DIAGNOSIS — N289 Disorder of kidney and ureter, unspecified: Secondary | ICD-10-CM

## 2016-02-07 DIAGNOSIS — N189 Chronic kidney disease, unspecified: Secondary | ICD-10-CM | POA: Diagnosis not present

## 2016-02-07 DIAGNOSIS — R0609 Other forms of dyspnea: Secondary | ICD-10-CM

## 2016-02-07 DIAGNOSIS — F17210 Nicotine dependence, cigarettes, uncomplicated: Secondary | ICD-10-CM | POA: Diagnosis present

## 2016-02-07 DIAGNOSIS — H538 Other visual disturbances: Secondary | ICD-10-CM | POA: Diagnosis present

## 2016-02-07 DIAGNOSIS — R0602 Shortness of breath: Secondary | ICD-10-CM | POA: Diagnosis not present

## 2016-02-07 DIAGNOSIS — I480 Paroxysmal atrial fibrillation: Secondary | ICD-10-CM | POA: Diagnosis present

## 2016-02-07 DIAGNOSIS — I119 Hypertensive heart disease without heart failure: Secondary | ICD-10-CM

## 2016-02-07 DIAGNOSIS — Z720 Tobacco use: Secondary | ICD-10-CM | POA: Diagnosis present

## 2016-02-07 DIAGNOSIS — Z9119 Patient's noncompliance with other medical treatment and regimen: Secondary | ICD-10-CM

## 2016-02-07 DIAGNOSIS — Z832 Family history of diseases of the blood and blood-forming organs and certain disorders involving the immune mechanism: Secondary | ICD-10-CM

## 2016-02-07 DIAGNOSIS — Z716 Tobacco abuse counseling: Secondary | ICD-10-CM

## 2016-02-07 DIAGNOSIS — J441 Chronic obstructive pulmonary disease with (acute) exacerbation: Secondary | ICD-10-CM | POA: Diagnosis present

## 2016-02-07 DIAGNOSIS — I161 Hypertensive emergency: Secondary | ICD-10-CM | POA: Diagnosis not present

## 2016-02-07 DIAGNOSIS — I1310 Hypertensive heart and chronic kidney disease without heart failure, with stage 1 through stage 4 chronic kidney disease, or unspecified chronic kidney disease: Secondary | ICD-10-CM | POA: Diagnosis present

## 2016-02-07 DIAGNOSIS — R0600 Dyspnea, unspecified: Secondary | ICD-10-CM | POA: Diagnosis present

## 2016-02-07 DIAGNOSIS — I700 Atherosclerosis of aorta: Secondary | ICD-10-CM | POA: Diagnosis present

## 2016-02-07 DIAGNOSIS — N183 Chronic kidney disease, stage 3 (moderate): Secondary | ICD-10-CM | POA: Diagnosis present

## 2016-02-07 DIAGNOSIS — J449 Chronic obstructive pulmonary disease, unspecified: Secondary | ICD-10-CM | POA: Diagnosis present

## 2016-02-07 DIAGNOSIS — Z8673 Personal history of transient ischemic attack (TIA), and cerebral infarction without residual deficits: Secondary | ICD-10-CM

## 2016-02-07 DIAGNOSIS — I4891 Unspecified atrial fibrillation: Secondary | ICD-10-CM

## 2016-02-07 DIAGNOSIS — G4733 Obstructive sleep apnea (adult) (pediatric): Secondary | ICD-10-CM | POA: Diagnosis present

## 2016-02-07 DIAGNOSIS — J4489 Other specified chronic obstructive pulmonary disease: Secondary | ICD-10-CM | POA: Diagnosis present

## 2016-02-07 DIAGNOSIS — Z23 Encounter for immunization: Secondary | ICD-10-CM

## 2016-02-07 DIAGNOSIS — J439 Emphysema, unspecified: Secondary | ICD-10-CM | POA: Diagnosis present

## 2016-02-07 LAB — CBC
HEMATOCRIT: 34.4 % — AB (ref 39.0–52.0)
HEMOGLOBIN: 12 g/dL — AB (ref 13.0–17.0)
MCH: 31.9 pg (ref 26.0–34.0)
MCHC: 34.9 g/dL (ref 30.0–36.0)
MCV: 91.5 fL (ref 78.0–100.0)
Platelets: 299 10*3/uL (ref 150–400)
RBC: 3.76 MIL/uL — ABNORMAL LOW (ref 4.22–5.81)
RDW: 13.7 % (ref 11.5–15.5)
WBC: 9.9 10*3/uL (ref 4.0–10.5)

## 2016-02-07 LAB — TSH: TSH: 0.755 u[IU]/mL (ref 0.350–4.500)

## 2016-02-07 LAB — APTT: APTT: 33 s (ref 24–36)

## 2016-02-07 LAB — BASIC METABOLIC PANEL
ANION GAP: 5 (ref 5–15)
BUN: 12 mg/dL (ref 6–20)
CHLORIDE: 108 mmol/L (ref 101–111)
CO2: 26 mmol/L (ref 22–32)
Calcium: 8.8 mg/dL — ABNORMAL LOW (ref 8.9–10.3)
Creatinine, Ser: 1.43 mg/dL — ABNORMAL HIGH (ref 0.61–1.24)
GFR calc Af Amer: >60 mL/min (ref >60)
GFR, EST NON AFRICAN AMERICAN: 53 mL/min — AB (ref >60)
GLUCOSE: 99 mg/dL (ref 65–99)
POTASSIUM: 3.9 mmol/L (ref 3.5–5.1)
Sodium: 139 mmol/L (ref 135–145)

## 2016-02-07 LAB — PROTIME-INR
INR: 1.12
Prothrombin Time: 14.5 seconds (ref 11.4–15.2)

## 2016-02-07 LAB — TROPONIN I: Troponin I: <0.03 ng/mL (ref <0.03)

## 2016-02-07 LAB — MRSA PCR SCREENING: MRSA BY PCR: NEGATIVE

## 2016-02-07 LAB — BRAIN NATRIURETIC PEPTIDE: B NATRIURETIC PEPTIDE 5: 208.6 pg/mL — AB (ref 0.0–100.0)

## 2016-02-07 LAB — I-STAT TROPONIN, ED: Troponin i, poc: 0.02 ng/mL (ref 0.00–0.08)

## 2016-02-07 MED ORDER — ASPIRIN EC 325 MG PO TBEC
325.0000 mg | DELAYED_RELEASE_TABLET | Freq: Every day | ORAL | Status: DC
  Administered 2016-02-07 – 2016-02-10 (×4): 325 mg via ORAL
  Filled 2016-02-07 (×3): qty 1

## 2016-02-07 MED ORDER — NITROGLYCERIN 2 % TD OINT
1.0000 [in_us] | TOPICAL_OINTMENT | Freq: Once | TRANSDERMAL | Status: AC
  Administered 2016-02-07: 1 [in_us] via TOPICAL
  Filled 2016-02-07: qty 1

## 2016-02-07 MED ORDER — HYDRALAZINE HCL 20 MG/ML IJ SOLN
10.0000 mg | Freq: Three times a day (TID) | INTRAMUSCULAR | Status: DC | PRN
  Administered 2016-02-07: 10 mg via INTRAVENOUS
  Filled 2016-02-07: qty 1

## 2016-02-07 MED ORDER — AMLODIPINE BESYLATE 10 MG PO TABS: 10.0000 mg | ORAL_TABLET | Freq: Every day | ORAL | Status: DC

## 2016-02-07 MED ORDER — ONDANSETRON HCL 4 MG/2ML IJ SOLN: 4.0000 mg | Freq: Four times a day (QID) | INTRAMUSCULAR | Status: DC | PRN

## 2016-02-07 MED ORDER — IBUPROFEN 200 MG PO TABS: 400.0000 mg | ORAL_TABLET | Freq: Once | ORAL | Status: DC

## 2016-02-07 MED ORDER — PANTOPRAZOLE SODIUM 40 MG PO TBEC
40.0000 mg | DELAYED_RELEASE_TABLET | Freq: Every day | ORAL | Status: DC
  Administered 2016-02-07 – 2016-02-10 (×4): 40 mg via ORAL
  Filled 2016-02-07 (×4): qty 1

## 2016-02-07 MED ORDER — LABETALOL HCL 5 MG/ML IV SOLN: 20.0000 mg | Freq: Once | INTRAVENOUS | Status: DC

## 2016-02-07 MED ORDER — ASPIRIN 81 MG PO CHEW
324.0000 mg | CHEWABLE_TABLET | Freq: Once | ORAL | Status: AC
  Administered 2016-02-07: 324 mg via ORAL
  Filled 2016-02-07: qty 4

## 2016-02-07 MED ORDER — NICARDIPINE HCL IN NACL 20-0.86 MG/200ML-% IV SOLN
3.0000 mg/h | INTRAVENOUS | Status: DC
  Administered 2016-02-07: 7.5 mg/h via INTRAVENOUS
  Administered 2016-02-07: 5 mg/h via INTRAVENOUS
  Administered 2016-02-08: 7.5 mg/h via INTRAVENOUS
  Administered 2016-02-08: 3 mg/h via INTRAVENOUS
  Filled 2016-02-07 (×6): qty 200

## 2016-02-07 MED ORDER — NICOTINE 21 MG/24HR TD PT24: 21.0000 mg | MEDICATED_PATCH | Freq: Every day | TRANSDERMAL | Status: DC

## 2016-02-07 MED ORDER — ALPRAZOLAM 0.25 MG PO TABS
0.2500 mg | ORAL_TABLET | Freq: Two times a day (BID) | ORAL | Status: DC | PRN
  Administered 2016-02-07 – 2016-02-08 (×2): 0.25 mg via ORAL
  Filled 2016-02-07 (×3): qty 1

## 2016-02-07 MED ORDER — ACETAMINOPHEN 325 MG PO TABS
650.0000 mg | ORAL_TABLET | ORAL | Status: DC | PRN
  Administered 2016-02-08 – 2016-02-10 (×5): 650 mg via ORAL
  Filled 2016-02-07 (×6): qty 2

## 2016-02-07 MED ORDER — FUROSEMIDE 10 MG/ML IJ SOLN
40.0000 mg | Freq: Once | INTRAMUSCULAR | Status: AC
  Administered 2016-02-07: 40 mg via INTRAVENOUS
  Filled 2016-02-07: qty 4

## 2016-02-07 MED ORDER — NITROGLYCERIN IN D5W 200-5 MCG/ML-% IV SOLN: 0.0000 ug/min | INTRAVENOUS | Status: DC

## 2016-02-07 NOTE — Progress Notes (Signed)
Called report to Burman Nieves, RN on 3S.

## 2016-02-07 NOTE — H&P (Signed)
History and Physical    Kevin [name] [name] DOB: October 16, 1955 DOA: 02/07/2016   PCP: Philis Fendt, MD   Patient coming from:  Home  Chief Complaint: Shortness of breath   HPI: Kevin [name] is a 60 y.o. male with medical history significant for HTN, tobacco abuse, presenting with 3 day history of increasing dyspnea, worse on exertion . He denies any fever chills or night sweats. He reports some cough, with 4 day history mildly productive yellow sputum. He denies any chest pain, but he does have occasional palpitations, especially over the last 2 days. He denies any nausea vomiting jaw pain order left arm pain. He smokes about one pack a day of cigarettes, lifelong habits. He denies any alcohol at this time. He denies in the use of any recreational drugs. No recent long distance travel. He denies any new medication, over-the-counter meds. He has not been seen by his primary care physician over one year has not been taking his medications since then. He denies any headaches, but he does have occasional blurred vision, without double vision. No confusion or seizures are reported. He denies any appetite changes. He denies any leg swelling or calf  calf pain. No family history of cancer, lung disease, or heart disease. He has never been seen by a cardiologist, or has had any cardiac catheterization, or echocardiogram.    ED Course:  BP (!) 177/128   Pulse 94   Temp 98.4 F (36.9 C) (Oral)   Resp 15   SpO2 99%   Tn 0.02   EKG Atrial fibrillation with premature ventricular or aberrantly conducted complexes ST & T wave abnormality, consider inferior ischemia  CXR : Peribronchial thickening and hyperinflation question COPD. No acute infiltrate sodium 139 potassium 3.9 BUN 12 creatinine 1.43 glucose 99  Review of Systems: As per HPI otherwise 10 point review of systems negative.   Past Medical History:  Diagnosis Date  . Hypertension     History reviewed. No pertinent surgical  history.  Social History Social History   Social History  . Marital status: Single    Spouse name: N/A  . Number of children: N/A  . Years of education: N/A   Occupational History  . Not on file.   Social History Main Topics  . Smoking status: Heavy Tobacco Smoker    Packs/day: 1.00    Types: Cigarettes  . Smokeless tobacco: Never Used  . Alcohol use Yes     Comment: socially  . Drug use: No  . Sexual activity: Not on file   Other Topics Concern  . Not on file   Social History Narrative  . No narrative on file     No Known Allergies  No family history on file.    Prior to Admission medications   Medication Sig Start Date End Date Taking? Authorizing Provider  famotidine (PEPCID) 20 MG tablet Take 1 tablet (20 mg total) by mouth 2 (two) times daily. 07/15/15   [name], MD  pantoprazole (PROTONIX) 40 MG tablet Take 1 tablet (40 mg total) by mouth daily. 07/15/15   [name], MD    Physical Exam:    Vitals:   02/07/16 1415 02/07/16 1415 02/07/16 1430 02/07/16 1445  BP: (!) 206/112 (!) 206/100 (!) 195/128 (!) 177/128  Pulse: 77  84 94  Resp: 23  15 15   Temp:      TempSrc:      SpO2: 100%  100% 99%  Constitutional: NAD, calm  Vitals:   02/07/16 1415 02/07/16 1415 02/07/16 1430 02/07/16 1445  BP: (!) 206/112 (!) 206/100 (!) 195/128 (!) 177/128  Pulse: 77  84 94  Resp: 23  15 15   Temp:      TempSrc:      SpO2: 100%  100% 99%   Eyes: PERRL, lids and conjunctivae normal ENMT: Mucous membranes are moist. Posterior pharynx clear of any exudate or lesions.Normal dentition.  Neck: normal, supple, no masses, no thyromegaly Respiratory: + bilateral atelectatic sounds, rhonchi, no wheezing or rales  Normal respiratory effort. No accessory muscle use.  Cardiovascular: Regular rate and rhythm, no murmurs / rubs / gallops. No extremity edema. 2+ pedal pulses. No carotid bruits.  Abdomen: no tenderness, no masses palpated. No hepatosplenomegaly.  Bowel sounds positive.  Musculoskeletal: mild  clubbing / cyanosis. No joint deformity upper and lower extremities. Good ROM, no contractures. Normal muscle tone.  Skin: no rashes, lesions, ulcers.  Neurologic: CN 2-12 grossly intact. Sensation intact, DTR normal. Strength 5/5 in all 4.  Psychiatric: Normal judgment and insight. Alert and oriented x 3. Normal mood.     Labs on Admission: I have personally reviewed following labs and imaging studies  CBC:  Recent Labs Lab 02/07/16 1319  WBC 9.9  HGB 12.0*  HCT 34.4*  MCV 91.5  PLT 892    Basic Metabolic Panel:  Recent Labs Lab 02/07/16 1319  NA 139  K 3.9  CL 108  CO2 26  GLUCOSE 99  BUN 12  CREATININE 1.43*  CALCIUM 8.8*    GFR: CrCl cannot be calculated (Unknown ideal weight.).  Liver Function Tests: No results for input(s): AST, ALT, ALKPHOS, BILITOT, PROT, ALBUMIN in the last 168 hours. No results for input(s): LIPASE, AMYLASE in the last 168 hours. No results for input(s): AMMONIA in the last 168 hours.  Coagulation Profile: No results for input(s): INR, PROTIME in the last 168 hours.  Cardiac Enzymes: No results for input(s): CKTOTAL, CKMB, CKMBINDEX, TROPONINI in the last 168 hours.  BNP (last 3 results) No results for input(s): PROBNP in the last 8760 hours.  HbA1C: No results for input(s): HGBA1C in the last 72 hours.  CBG: No results for input(s): GLUCAP in the last 168 hours.  Lipid Profile: No results for input(s): CHOL, HDL, LDLCALC, TRIG, CHOLHDL, LDLDIRECT in the last 72 hours.  Thyroid Function Tests: No results for input(s): TSH, T4TOTAL, FREET4, T3FREE, THYROIDAB in the last 72 hours.  Anemia Panel: No results for input(s): VITAMINB12, FOLATE, FERRITIN, TIBC, IRON, RETICCTPCT in the last 72 hours.  Urine analysis:    Component Value Date/Time   COLORURINE YELLOW 07/15/2015 1015   APPEARANCEUR HAZY (A) 07/15/2015 1015   LABSPEC 1.017 07/15/2015 1015   PHURINE 5.5 07/15/2015  1015   GLUCOSEU NEGATIVE 07/15/2015 1015   HGBUR NEGATIVE 07/15/2015 1015   BILIRUBINUR NEGATIVE 07/15/2015 1015   KETONESUR NEGATIVE 07/15/2015 1015   PROTEINUR NEGATIVE 07/15/2015 1015   UROBILINOGEN 0.2 01/17/2013 0022   NITRITE NEGATIVE 07/15/2015 1015   LEUKOCYTESUR NEGATIVE 07/15/2015 1015    Sepsis Labs: @LABRCNTIP (procalcitonin:4,lacticidven:4) )No results found for this or any previous visit (from the past 240 hour(s)).   Radiological Exams on Admission: Dg Chest 2 View  Result Date: 02/07/2016 CLINICAL DATA:  Shortness of breath and chest pain since yesterday, sudden episode of shortness of breath with chest tightness, history hypertension, smoking EXAM: CHEST  2 VIEW COMPARISON:  None FINDINGS: Normal heart size, mediastinal contours, and pulmonary vascularity. Mild central  peribronchial thickening and slight hyperinflation question COPD. No pulmonary infiltrate, pleural effusion or pneumothorax. Osseous structures unremarkable. IMPRESSION: Peribronchial thickening and hyperinflation question COPD. No acute infiltrate. Electronically Signed   By: Lavonia Dana M.D.   On: 02/07/2016 13:40    EKG: Independently reviewed.  Assessment/Plan Active Problems:   Dyspnea   Atrial fibrillation (HCC)   Tobacco abuse   CKD (chronic kidney disease)   COPD (chronic obstructive pulmonary disease) (HCC)    Atrial Fibrillation, new diagnosis  CHA2DS2-VASc score 1-2. Not on anticoagulation  Rate controlled 2 D echo ASA  Further recommendations pending on consult by Cards, requested by EDP .  Check serial Tn, PT/INR, TSH, BNP   Poorly Controlled  Hypertension . Patient has not been seen by PCP in 1 year, has not been on meds since.  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 Currently 1.43 .  Lab Results  Component Value Date   CREATININE 1.43 (H) 02/07/2016   CREATININE 1.42 (H) 07/15/2015   CREATININE 1.12 01/16/2013  CMET in am  I/O and daily weights  COPD  In a patient  with heavy tobacco abuse Osats in the 90s in RA.   CXR suspicious for COPD   WBC 9.9  Continue  O2 prn  Tobacco cessation counseled Nicotine patch      DVT prophylaxis: SCDs  Code Status:   Full    Family Communication:  Discussed with patient Disposition Plan: Expect patient to be discharged to home after condition improves Consults called:    None Admission status:Tele  Obs    Cassiopeia Florentino E, PA-C [facility]   02/07/2016, 3:17 PM

## 2016-02-07 NOTE — Progress Notes (Addendum)
New pt admission from ED. Pt brought to the floor, vitals taken. Initial Assessment done. All immediate pertinent needs to patient addressed. Patient Guide given to patient. Important safety instructions relating to hospitalization reviewed with patient. Patient alert and oriented and verbalized understanding. Will continue to monitor pt.  Clarise Cruz RN

## 2016-02-07 NOTE — ED Provider Notes (Addendum)
[facility] DEPT Provider Note   CSN: [id number] Arrival date & time: 02/07/16  1311     History   Chief Complaint Chief Complaint  Patient presents with  . Shortness of Breath  . Chest Pain    HPI CLIDE REMMERS is a 60 y.o. male.Complains of shortness of breath with minimal exertion onset yesterday. He denies any chest pain he states he can only walk several feet. Other associated symptoms include cough for the past 4 days productive of yellow sputum. He denies fever denies other associated symptoms. No treatment prior to coming here.  HPI  Past Medical History:  Diagnosis Date  . Hypertension    Noncompliant with antihypertensive medication for at least 1 year There are no active problems to display for this patient.   History reviewed. No pertinent surgical history.     Home Medications    Prior to Admission medications   Medication Sig Start Date End Date Taking? Authorizing Provider  famotidine (PEPCID) 20 MG tablet Take 1 tablet (20 mg total) by mouth 2 (two) times daily. 07/15/15   [name], MD  pantoprazole (PROTONIX) 40 MG tablet Take 1 tablet (40 mg total) by mouth daily. 07/15/15   [name], MD    Family History No family history on file.  Social History Social History  Substance Use Topics  . Smoking status: Heavy Tobacco Smoker    Packs/day: 0.50  . Smokeless tobacco: Not on file  . Alcohol use Yes     Comment: socially   Denies drug use  Allergies   Patient has no known allergies.   Review of Systems Review of Systems  Constitutional: Negative.   HENT: Negative.   Respiratory: Positive for cough and shortness of breath.   Cardiovascular: Negative.   Gastrointestinal: Negative.   Musculoskeletal: Negative.   Skin: Negative.   Neurological: Negative.   Psychiatric/Behavioral: Negative.   All other systems reviewed and are negative.    Physical Exam Updated Vital Signs BP (!) 206/100 (BP Location: Right Arm)    Pulse 77   Temp 98.4 F (36.9 C) (Oral)   Resp 23   SpO2 100%   Physical Exam  Constitutional: He appears well-developed and well-nourished.  HENT:  Head: Normocephalic and atraumatic.  Eyes: Conjunctivae are normal. Pupils are equal, round, and reactive to light.  Neck: Neck supple. No tracheal deviation present. No thyromegaly present.  Cardiovascular: Normal rate and intact distal pulses.   No murmur heard. Irregularly irregular  Pulmonary/Chest: Effort normal and breath sounds normal.  Abdominal: Soft. Bowel sounds are normal. He exhibits no distension. There is no tenderness.  Musculoskeletal: Normal range of motion. He exhibits no edema or tenderness.  Neurological: He is alert. Coordination normal.  Skin: Skin is warm and dry. No rash noted.  Psychiatric: He has a normal mood and affect.  Nursing note and vitals reviewed.    ED Treatments / Results  Labs (all labs ordered are listed, but only abnormal results are displayed) Labs Reviewed  BASIC METABOLIC PANEL - Abnormal; Notable for the following:       Result Value   Creatinine, Ser 1.43 (*)    Calcium 8.8 (*)    GFR calc non Af Amer 53 (*)    All other components within normal limits  CBC - Abnormal; Notable for the following:    RBC 3.76 (*)    Hemoglobin 12.0 (*)    HCT 34.4 (*)    All other components within normal limits  I-STAT  TROPOININ, ED    EKG  EKG Interpretation  Date/Time:  Monday February 07 2016 13:13:26 EST Ventricular Rate:  80 PR Interval:    QRS Duration: 90 QT Interval:  370 QTC Calculation: 426 R Axis:   65 Text Interpretation:  Atrial fibrillation with premature ventricular or aberrantly conducted complexes ST & T wave abnormality, consider inferior ischemia Abnormal ECG No old tracing to compare Confirmed by Winfred Leeds  MD, Naksh Radi [phone]) on 02/07/2016 1:40:57 PM       Radiology Dg Chest 2 View  Result Date: 02/07/2016 CLINICAL DATA:  Shortness of breath and chest pain since  yesterday, sudden episode of shortness of breath with chest tightness, history hypertension, smoking EXAM: CHEST  2 VIEW COMPARISON:  None FINDINGS: Normal heart size, mediastinal contours, and pulmonary vascularity. Mild central peribronchial thickening and slight hyperinflation question COPD. No pulmonary infiltrate, pleural effusion or pneumothorax. Osseous structures unremarkable. IMPRESSION: Peribronchial thickening and hyperinflation question COPD. No acute infiltrate. Electronically Signed   By: Lavonia Dana M.D.   On: 02/07/2016 13:40    Procedures Procedures (including critical care time)  Medications Ordered in ED Medications  aspirin chewable tablet 324 mg (not administered)  nitroGLYCERIN (NITROGLYN) 2 % ointment 1 inch (not administered)     Initial Impression / Assessment and Plan / ED Course  I have reviewed the triage vital signs and the nursing notes.  Pertinent labs & imaging results that were available during my care of the patient were reviewed by me and considered in my medical decision making (see chart for details). Chest x-ray viewed by me Results for orders placed or performed during the hospital encounter of 58/52/77  Basic metabolic panel  Result Value Ref Range   Sodium 139 135 - 145 mmol/L   Potassium 3.9 3.5 - 5.1 mmol/L   Chloride 108 101 - 111 mmol/L   CO2 26 22 - 32 mmol/L   Glucose, Bld 99 65 - 99 mg/dL   BUN 12 6 - 20 mg/dL   Creatinine, Ser 1.43 (H) 0.61 - 1.24 mg/dL   Calcium 8.8 (L) 8.9 - 10.3 mg/dL   GFR calc non Af Amer 53 (L) >60 mL/min   GFR calc Af Amer >60 >60 mL/min   Anion gap 5 5 - 15  CBC  Result Value Ref Range   WBC 9.9 4.0 - 10.5 K/uL   RBC 3.76 (L) 4.22 - 5.81 MIL/uL   Hemoglobin 12.0 (L) 13.0 - 17.0 g/dL   HCT 34.4 (L) 39.0 - 52.0 %   MCV 91.5 78.0 - 100.0 fL   MCH 31.9 26.0 - 34.0 pg   MCHC 34.9 30.0 - 36.0 g/dL   RDW 13.7 11.5 - 15.5 %   Platelets 299 150 - 400 K/uL  I-stat troponin, ED  Result Value Ref Range    Troponin i, poc 0.02 0.00 - 0.08 ng/mL   Comment 3          Chest x-ray viewed by me Dg Chest 2 View  Result Date: 02/07/2016 CLINICAL DATA:  Shortness of breath and chest pain since yesterday, sudden episode of shortness of breath with chest tightness, history hypertension, smoking EXAM: CHEST  2 VIEW COMPARISON:  None FINDINGS: Normal heart size, mediastinal contours, and pulmonary vascularity. Mild central peribronchial thickening and slight hyperinflation question COPD. No pulmonary infiltrate, pleural effusion or pneumothorax. Osseous structures unremarkable. IMPRESSION: Peribronchial thickening and hyperinflation question COPD. No acute infiltrate. Electronically Signed   By: Lavonia Dana M.D.   On: 02/07/2016 13:40  Clinical Course     CHA2ds2-vascScore equals 1. Heart score equals 4 Hospitalist service consulted, Dr. Aggie Moats and will arrange for 23 hour observation to telemetry.Rene Paci also consulted cardiology service, Dr Debara Pickett who will see patient in the ED Final Clinical Impressions(s) / ED Diagnoses  Diagnosis #1 acute dyspnea #2 atrial fibrillation #3 renal insufficiency #4 tobacco abuse Final diagnoses:  None    New Prescriptions New Prescriptions   No medications on file     Orlie Dakin, MD 02/07/16 [place], MD 02/07/16 1457

## 2016-02-07 NOTE — ED Triage Notes (Signed)
Pt c/o sob and chest pain since yesterday.

## 2016-02-07 NOTE — Progress Notes (Signed)
Attempted report to 3S. Left number, awaiting call back.

## 2016-02-07 NOTE — Consult Note (Signed)
CARDIOLOGY CONSULT NOTE   Patient ID: Kevin [name] MRN: [id number], DOB/AGE: 58-Jul-1957   Admit date: 02/07/2016 Date of Consult: 02/07/2016   Primary Physician: Philis Fendt, MD Primary Cardiologist: new   Pt. Profile  Kevin [name] is a pleasant 60 year old African-American male with past medical history of hypertension not on any medication presented with productive cough for the past 2 weeks and worsening dyspnea on exertion for the past 2 days. He denies any chest pain. On arrival he was noted to begin rate-controlled atrial fibrillation. Cardiology was consulted for management of atrial fibrillation. He also noted to have significant LVH and chronically uncontrolled hypertension pressure.  Problem List  Past Medical History:  Diagnosis Date  . Hypertension     Past Surgical History:  Procedure Laterality Date  . no past surgery       Allergies  No Known Allergies  HPI   Kevin [name] is a pleasant 60 year old African-American male with past medical history of hypertension not on any medication. He also has likely obstructive sleep apnea, he never had a sleep study before. His wife mentions he snores like a freight train, and frequently has episodes where he stopped breathing during sleep. He also has remote childhood asthma that has not recurred during his adulthood. According to his wife, his blood pressure has been ranging in the 170s to 200 since she first know him. He was formally diagnosed with HTN in the past 3 years, however she knows it has been going on for a lot longer than that. He does not have a primary care physician and has not taking any medication. He is quite active with work and works 7 days a week. He denies any recent exertional chest discomfort or shortness of breath.  For the past 2 weeks, he has been having a productive cough. He denies any fever or chill. He denies any lower extremity edema, orthopnea or paroxysmal nocturnal dyspnea. For the past 2  days, he started noticing worsening dyspnea with exertion. Eventually he decided to seek medical attention at [facility] on 02/07/2016. Initial lab include creatinine of 1.43, although patient denies any history of chronic kidney disease. Troponin 0.02. Hemoglobin 12.0. Normal electrolytes. Chest x-ray showed peribronchial thickening and hyperinflation question COPD. EKG however shows rate-controlled atrial fibrillation with T-wave inversion in the inferior leads and LVH.  Inpatient Medications  . aspirin EC  325 mg Oral Daily  . ibuprofen  400 mg Oral Once  . nicotine  21 mg Transdermal Daily  . pantoprazole  40 mg Oral Daily    Family History Family History  Problem Relation Age of Onset  . High blood pressure Mother   . Lupus Mother      Social History Social History   Social History  . Marital status: Single    Spouse name: N/A  . Number of children: N/A  . Years of education: N/A   Occupational History  . Not on file.   Social History Main Topics  . Smoking status: Heavy Tobacco Smoker    Packs/day: 1.00    Types: Cigarettes  . Smokeless tobacco: Never Used     Comment: < 1 ppd  . Alcohol use Yes     Comment: socially  . Drug use: No  . Sexual activity: Not on file   Other Topics Concern  . Not on file   Social History Narrative  . No narrative on file     Review of Systems  General:  No  chills, fever, night sweats or weight changes.  Cardiovascular:  No chest pain, edema, orthopnea, palpitations, paroxysmal nocturnal dyspnea. +dyspnea on exertion Dermatological: No rash, lesions/masses Respiratory: No cough, dyspnea Urologic: No hematuria, dysuria Abdominal:   No nausea, vomiting, diarrhea, bright red blood per rectum, melena, or hematemesis Neurologic:  No visual changes, wkns, changes in mental status. All other systems reviewed and are otherwise negative except as noted above.  Physical Exam  Blood pressure (!) 177/128, pulse 94,  temperature 98.4 F (36.9 C), temperature source Oral, resp. rate 15, SpO2 99 %.  General: Pleasant, NAD Psych: Normal affect. Neuro: Alert and oriented X 3. Moves all extremities spontaneously. HEENT: Normal  Neck: Supple without bruits or JVD. Lungs:  Resp regular and unlabored. No rale on exam, but diminished breath sounds throughout. No wheezing or rhonchi. Heart: Irregularly irregular no s3, s4, or murmurs. Abdomen: Soft, non-tender, non-distended, BS + x 4.  Extremities: No clubbing, cyanosis or edema. DP/PT/Radials 2+ and equal bilaterally.  Labs  No results for input(s): CKTOTAL, CKMB, TROPONINI in the last 72 hours. Lab Results  Component Value Date   WBC 9.9 02/07/2016   HGB 12.0 (L) 02/07/2016   HCT 34.4 (L) 02/07/2016   MCV 91.5 02/07/2016   PLT 299 02/07/2016     Recent Labs Lab 02/07/16 1319  NA 139  K 3.9  CL 108  CO2 26  BUN 12  CREATININE 1.43*  CALCIUM 8.8*  GLUCOSE 99   No results found for: CHOL, HDL, LDLCALC, TRIG No results found for: DDIMER  Radiology/Studies  Dg Chest 2 View  Result Date: 02/07/2016 CLINICAL DATA:  Shortness of breath and chest pain since yesterday, sudden episode of shortness of breath with chest tightness, history hypertension, smoking EXAM: CHEST  2 VIEW COMPARISON:  None FINDINGS: Normal heart size, mediastinal contours, and pulmonary vascularity. Mild central peribronchial thickening and slight hyperinflation question COPD. No pulmonary infiltrate, pleural effusion or pneumothorax. Osseous structures unremarkable. IMPRESSION: Peribronchial thickening and hyperinflation question COPD. No acute infiltrate. Electronically Signed   By: Lavonia Dana M.D.   On: 02/07/2016 13:40    ECG  Atrial fibrillation, rate controlled, T wave inversion in the inferior lead, LVH.  ASSESSMENT AND PLAN  1. Newly diagnosed atrial fibrillation, self rate controlled on no rate control medication.  - This patients CHA2DS2-VASc Score and  unadjusted Ischemic Stroke Rate (% per year) is equal to 2.2 % stroke rate/year from a score of 2  Above score calculated as 1 point each if present [CHF, HTN, DM, Vascular=MI/PAD/Aortic Plaque, Age if 65-74, or Male] Above score calculated as 2 points each if present [Age > 75, or Stroke/TIA/TE]  - I gave him a point for aortic plaque because on the CT abdomen and pelvis obtained on 07/15/2015, there is mild aortic atherosclerosis. If echocardiogram does show his ejection fraction is being on, he will get another point.  - Fortunately, he is self rate controlled. I would recommend carvedilol for better blood pressure control. Given her chronic renal insufficiency, he will also benefit from ARB/ACE inhibitor. If EF is down, he will also need spironolactone as well.  - I did discuss with the patient benefit and risk of Coumadin versus eliquis versus Xarelto. He will be at good candidate for NOAC, however, cost may be an issue. I do not think his current creatinine of 1.4 this prohibitive for starting on NOAC.  - check TSH and echo  2. Hypertensive urgency with chronically uncontrolled high blood pressure secondary to lack  of medical care  - Unfortunately although his blood pressure here is range in the 160-200, this is actually his normal blood pressure for long time. Although he has known about hypertension, he does not take any medication for it.  - EKG showed sinus LVH, echocardiogram is currently pending which likely also support LVH. Will need to assess the ejection fraction, given the chronicity of his malignant hypertension, would not be too surprised if ejection fraction is mildly low. If ejection fraction is down, given inferior T-wave inversion, will need outpatient Myoview. Otherwise, patient denies any exertional chest discomfort despite works 7 days a week and stay active.  - He does not have a primary care doctor, to use the time he spent during this hospitalization to optimize his blood  pressure medication and have him establish with a primary care physician.  3. Likely OSA: Wife indicates patient has been snoring for a long time and also noticed he stopped breathing quite often during sleep as well. He will need an outpatient sleep study.  4. Likely CKD: His creatinine is 1.43, I do not think he is dry. 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  5. Productive cough likely COPD exacerbation: He does not appears to be fluid overloaded based on physical exam. Although internal medicine service has ordered a Lasix 40 mg 1, will reassess his response. At some point he may benefit from pulmonary function test.   Signed, Almyra Deforest, PA-C 02/07/2016, 3:37 PM

## 2016-02-08 ENCOUNTER — Other Ambulatory Visit (HOSPITAL_COMMUNITY): Payer: Self-pay

## 2016-02-08 ENCOUNTER — Observation Stay (HOSPITAL_COMMUNITY): Payer: Medicaid Other

## 2016-02-08 DIAGNOSIS — R0600 Dyspnea, unspecified: Secondary | ICD-10-CM | POA: Diagnosis present

## 2016-02-08 DIAGNOSIS — F17210 Nicotine dependence, cigarettes, uncomplicated: Secondary | ICD-10-CM | POA: Diagnosis present

## 2016-02-08 DIAGNOSIS — Z716 Tobacco abuse counseling: Secondary | ICD-10-CM | POA: Diagnosis not present

## 2016-02-08 DIAGNOSIS — G4733 Obstructive sleep apnea (adult) (pediatric): Secondary | ICD-10-CM | POA: Diagnosis present

## 2016-02-08 DIAGNOSIS — N183 Chronic kidney disease, stage 3 (moderate): Secondary | ICD-10-CM | POA: Diagnosis present

## 2016-02-08 DIAGNOSIS — Z23 Encounter for immunization: Secondary | ICD-10-CM | POA: Diagnosis not present

## 2016-02-08 DIAGNOSIS — Z9119 Patient's noncompliance with other medical treatment and regimen: Secondary | ICD-10-CM | POA: Diagnosis not present

## 2016-02-08 DIAGNOSIS — J439 Emphysema, unspecified: Secondary | ICD-10-CM | POA: Diagnosis not present

## 2016-02-08 DIAGNOSIS — J449 Chronic obstructive pulmonary disease, unspecified: Secondary | ICD-10-CM | POA: Diagnosis present

## 2016-02-08 DIAGNOSIS — R0602 Shortness of breath: Secondary | ICD-10-CM | POA: Diagnosis present

## 2016-02-08 DIAGNOSIS — I161 Hypertensive emergency: Secondary | ICD-10-CM | POA: Diagnosis present

## 2016-02-08 DIAGNOSIS — I1310 Hypertensive heart and chronic kidney disease without heart failure, with stage 1 through stage 4 chronic kidney disease, or unspecified chronic kidney disease: Secondary | ICD-10-CM | POA: Diagnosis present

## 2016-02-08 DIAGNOSIS — H538 Other visual disturbances: Secondary | ICD-10-CM | POA: Diagnosis present

## 2016-02-08 DIAGNOSIS — Z8673 Personal history of transient ischemic attack (TIA), and cerebral infarction without residual deficits: Secondary | ICD-10-CM | POA: Diagnosis not present

## 2016-02-08 DIAGNOSIS — Z832 Family history of diseases of the blood and blood-forming organs and certain disorders involving the immune mechanism: Secondary | ICD-10-CM | POA: Diagnosis not present

## 2016-02-08 DIAGNOSIS — I700 Atherosclerosis of aorta: Secondary | ICD-10-CM | POA: Diagnosis present

## 2016-02-08 DIAGNOSIS — I4891 Unspecified atrial fibrillation: Secondary | ICD-10-CM | POA: Diagnosis not present

## 2016-02-08 DIAGNOSIS — N189 Chronic kidney disease, unspecified: Secondary | ICD-10-CM | POA: Diagnosis not present

## 2016-02-08 DIAGNOSIS — I480 Paroxysmal atrial fibrillation: Secondary | ICD-10-CM | POA: Diagnosis present

## 2016-02-08 DIAGNOSIS — Z720 Tobacco use: Secondary | ICD-10-CM | POA: Diagnosis not present

## 2016-02-08 LAB — COMPREHENSIVE METABOLIC PANEL
ALBUMIN: 3.2 g/dL — AB (ref 3.5–5.0)
ALT: 16 U/L — ABNORMAL LOW (ref 17–63)
AST: 20 U/L (ref 15–41)
Alkaline Phosphatase: 42 U/L (ref 38–126)
Anion gap: 9 (ref 5–15)
BUN: 15 mg/dL (ref 6–20)
CHLORIDE: 105 mmol/L (ref 101–111)
CO2: 23 mmol/L (ref 22–32)
Calcium: 9.2 mg/dL (ref 8.9–10.3)
Creatinine, Ser: 1.52 mg/dL — ABNORMAL HIGH (ref 0.61–1.24)
GFR calc Af Amer: 57 mL/min — ABNORMAL LOW (ref >60)
GFR calc non Af Amer: 49 mL/min — ABNORMAL LOW (ref >60)
GLUCOSE: 95 mg/dL (ref 65–99)
POTASSIUM: 3.7 mmol/L (ref 3.5–5.1)
Sodium: 137 mmol/L (ref 135–145)
Total Bilirubin: 0.5 mg/dL (ref 0.3–1.2)
Total Protein: 8 g/dL (ref 6.5–8.1)

## 2016-02-08 LAB — TROPONIN I

## 2016-02-08 MED ORDER — TIOTROPIUM BROMIDE MONOHYDRATE 18 MCG IN CAPS
18.0000 ug | ORAL_CAPSULE | Freq: Every day | RESPIRATORY_TRACT | Status: DC
  Administered 2016-02-08 – 2016-02-10 (×2): 18 ug via RESPIRATORY_TRACT
  Filled 2016-02-08 (×2): qty 5

## 2016-02-08 MED ORDER — IPRATROPIUM-ALBUTEROL 0.5-2.5 (3) MG/3ML IN SOLN
3.0000 mL | Freq: Four times a day (QID) | RESPIRATORY_TRACT | Status: DC
  Administered 2016-02-08 – 2016-02-09 (×4): 3 mL via RESPIRATORY_TRACT
  Filled 2016-02-08 (×4): qty 3

## 2016-02-08 MED ORDER — ISOSORBIDE MONONITRATE ER 30 MG PO TB24
30.0000 mg | ORAL_TABLET | Freq: Every day | ORAL | Status: DC
  Administered 2016-02-08 – 2016-02-10 (×3): 30 mg via ORAL
  Filled 2016-02-08 (×3): qty 1

## 2016-02-08 MED ORDER — PREDNISONE 20 MG PO TABS
20.0000 mg | ORAL_TABLET | Freq: Every day | ORAL | Status: DC
  Administered 2016-02-08 – 2016-02-09 (×2): 20 mg via ORAL
  Filled 2016-02-08 (×2): qty 1

## 2016-02-08 MED ORDER — ALBUTEROL SULFATE (2.5 MG/3ML) 0.083% IN NEBU: 3.0000 mL | INHALATION_SOLUTION | RESPIRATORY_TRACT | Status: DC | PRN

## 2016-02-08 MED ORDER — DILTIAZEM HCL 25 MG/5ML IV SOLN
10.0000 mg | Freq: Once | INTRAVENOUS | Status: AC
  Administered 2016-02-08: 10 mg via INTRAVENOUS
  Filled 2016-02-08: qty 5

## 2016-02-08 MED ORDER — MOMETASONE FURO-FORMOTEROL FUM 100-5 MCG/ACT IN AERO
2.0000 | INHALATION_SPRAY | Freq: Two times a day (BID) | RESPIRATORY_TRACT | Status: DC
  Administered 2016-02-08 – 2016-02-10 (×4): 2 via RESPIRATORY_TRACT
  Filled 2016-02-08 (×2): qty 8.8

## 2016-02-08 MED ORDER — METOPROLOL TARTRATE 25 MG PO TABS
25.0000 mg | ORAL_TABLET | Freq: Two times a day (BID) | ORAL | Status: DC
  Administered 2016-02-08: 25 mg via ORAL
  Filled 2016-02-08: qty 1

## 2016-02-08 MED ORDER — NICOTINE 14 MG/24HR TD PT24
14.0000 mg | MEDICATED_PATCH | Freq: Every day | TRANSDERMAL | Status: DC
  Administered 2016-02-08 – 2016-02-10 (×3): 14 mg via TRANSDERMAL
  Filled 2016-02-08 (×3): qty 1

## 2016-02-08 MED ORDER — HYDRALAZINE HCL 25 MG PO TABS
25.0000 mg | ORAL_TABLET | Freq: Three times a day (TID) | ORAL | Status: DC
  Administered 2016-02-08 – 2016-02-09 (×4): 25 mg via ORAL
  Filled 2016-02-08 (×4): qty 1

## 2016-02-08 MED ORDER — DILTIAZEM HCL-DEXTROSE 100-5 MG/100ML-% IV SOLN (PREMIX)
5.0000 mg/h | INTRAVENOUS | Status: DC
  Administered 2016-02-08: 5 mg/h via INTRAVENOUS
  Filled 2016-02-08: qty 100

## 2016-02-08 MED ORDER — DILTIAZEM HCL 60 MG PO TABS
60.0000 mg | ORAL_TABLET | Freq: Three times a day (TID) | ORAL | Status: DC
  Administered 2016-02-08 – 2016-02-10 (×5): 60 mg via ORAL
  Filled 2016-02-08 (×5): qty 1

## 2016-02-08 MED ORDER — DILTIAZEM HCL 60 MG PO TABS
60.0000 mg | ORAL_TABLET | Freq: Two times a day (BID) | ORAL | Status: DC
  Administered 2016-02-08: 60 mg via ORAL
  Filled 2016-02-08: qty 1

## 2016-02-08 MED ORDER — FUROSEMIDE 20 MG PO TABS
20.0000 mg | ORAL_TABLET | Freq: Every day | ORAL | Status: DC
  Administered 2016-02-08 – 2016-02-09 (×2): 20 mg via ORAL
  Filled 2016-02-08 (×2): qty 1

## 2016-02-08 NOTE — Progress Notes (Signed)
Wife confided in this nurse that she is concerned re:  Her husband's episodic sleep apnea at home.  She states it is frequent enough to concern her and would like his medical MD to know.

## 2016-02-08 NOTE — Progress Notes (Signed)
Patient Name: Kevin [name] Date of Encounter: 02/08/2016  Primary Cardiologist: [facility] Problem List     Principal Problem:   Hypertensive emergency Active Problems:   Shortness of breath   Atrial fibrillation (HCC)   Tobacco abuse   CKD (chronic kidney disease)   COPD (chronic obstructive pulmonary disease) (HCC)   Blurred vision, bilateral     Subjective   I feel weird just back from CT   Inpatient Medications    Scheduled Meds: . aspirin EC  325 mg Oral Daily  . ibuprofen  400 mg Oral Once  . labetalol  20 mg Intravenous Once  . pantoprazole  40 mg Oral Daily   Continuous Infusions: . niCARDipine 3 mg/hr (02/08/16 0700)   PRN Meds: acetaminophen, ALPRAZolam, hydrALAZINE, ondansetron (ZOFRAN) IV   Vital Signs    Vitals:   02/08/16 0230 02/08/16 0245 02/08/16 0700 02/08/16 0735  BP: (!) 149/95 (!) 143/76 (!) 148/87   Pulse:  (!) 103    Resp: (!) 21 16 10    Temp:    98.9 F (37.2 C)  TempSrc:    Oral  SpO2:      Weight:      Height:        Intake/Output Summary (Last 24 hours) at 02/08/16 0751 Last data filed at 02/08/16 0700  Gross per 24 hour  Intake           856.25 ml  Output             2700 ml  Net         -1843.75 ml   Filed Weights   02/07/16 1628  Weight: 162 lb 4.8 oz (73.6 kg)    Physical Exam    GEN: Disheveled black male  HEENT: Grossly normal.  Neck: Supple, no JVD, carotid bruits, or masses. Cardiac: RRR, no murmurs, rubs, or gallops. No clubbing, cyanosis, edema.  Radials/DP/PT 2+ and equal bilaterally.  Respiratory:  Respirations regular and unlabored, clear to auscultation bilaterally. GI: Soft, nontender, nondistended, BS + x 4. MS: no deformity or atrophy. Skin: warm and dry, no rash. Neuro:  Strength and sensation are intact. Psych: AAOx3.  Normal affect.  Labs    CBC  Recent Labs  02/07/16 1319  WBC 9.9  HGB 12.0*  HCT 34.4*  MCV 91.5  PLT 366   Basic Metabolic Panel  Recent Labs  02/07/16 1319  NA 139  K 3.9  CL 108  CO2 26  GLUCOSE 99  BUN 12  CREATININE 1.43*  CALCIUM 8.8*   Cardiac Enzymes  Recent Labs  02/07/16 1526 02/07/16 2031 02/08/16 0227  TROPONINI <0.03 <0.03 <0.03   Thyroid Function Tests  Recent Labs  02/07/16 1526  TSH 0.755    Telemetry    Afib rate 114  - Personally Reviewed  ECG    Afib LVH  - Personally Reviewed  Radiology    Dg Chest 2 View  Result Date: 02/07/2016 CLINICAL DATA:  Shortness of breath and chest pain since yesterday, sudden episode of shortness of breath with chest tightness, history hypertension, smoking EXAM: CHEST  2 VIEW COMPARISON:  None FINDINGS: Normal heart size, mediastinal contours, and pulmonary vascularity. Mild central peribronchial thickening and slight hyperinflation question COPD. No pulmonary infiltrate, pleural effusion or pneumothorax. Osseous structures unremarkable. IMPRESSION: Peribronchial thickening and hyperinflation question COPD. No acute infiltrate. Electronically Signed   By: Lavonia Dana M.D.   On: 02/07/2016 13:40   Ct Head Wo Contrast  Result  Date: 02/08/2016 CLINICAL DATA:  Hypertension and blurred vision EXAM: CT HEAD WITHOUT CONTRAST TECHNIQUE: Contiguous axial images were obtained from the base of the skull through the vertex without intravenous contrast. COMPARISON:  None. FINDINGS: Brain: No mass lesion, intraparenchymal hemorrhage or extra-axial collection. No evidence of acute cortical infarct. Brain parenchyma and CSF-containing spaces are normal for age. Vascular: No hyperdense vessel or unexpected calcification. Skull: Normal visualized skull base, calvarium and extracranial soft tissues. Sinuses/Orbits: Findings of prior functional endoscopic sinus surgery. Partial opacification of the ethmoid and maxillary sinuses with secretions and mucosal thickening. Normal orbits. IMPRESSION: 1. No acute intracranial abnormality. 2. Ethmoid and maxillary sinus disease.  Electronically Signed   By: Ulyses Jarred M.D.   On: 02/08/2016 03:44    Cardiac Studies   Echo pending   Patient Profile     60 y.o. poor medical f/u admitted with   Assessment & Plan    HTN:  Well controlled wean nicardapine and start oral meds Afib: start lopressor 25 bid. Primary service to decide on NOAC CT head negative so safe to strart anticoagulation If echo shows normal EF no rush to restore NSR and would arrange outpatient f/u with Dr Debara Pickett  Signed, Jenkins Rouge, MD  02/08/2016, 7:51 AM

## 2016-02-08 NOTE — Progress Notes (Addendum)
[facility] Progress Note  Patient: Kevin [name] NUU:[id number]   PCP: Philis Fendt, MD DOB: [birth year]   DOA: 02/07/2016   DOS: 02/08/2016   Date of Service: the patient was seen and examined on 02/08/2016  Brief hospital course: Pt. with PMH of HTN, active smoker, CKD; admitted on 02/07/2016, with complaint of shortness of breath, was found to have hypertensive urgency. Also had A. fib with RVR Currently further plan is continue close monitoring.  Assessment and Plan: 1. Hypertensive emergency Patient was admitted in the step down unit, transferred to ICU by cardiology. Patient was placed on Nicardipin drip. Currently blood pressure is significantly better, I would change back to stepdown unit. I would add hydralazine, Imdur, oral Cardizem Use when necessary IV hydralazine. Echocardiogram currently pending.  2. Dyspnea on exertion. Serial troponins are negative. EKG unremarkable. Likely acute on chronic diastolic dysfunction in the setting of hypertensive urgency. We will initiate low-dose Lasix. Echo cardiogram currently pending   3. A. fib with RVR.  CHA2DS2-VASc Score 4 Patient is currently in A. fib at rapid ventricular rate. We'll continue oral Cardizem. Monitoring step down unit. Family currently getting about cost associated with warfarin and Coumadin clinic follow-up as well as NOAC Case management consulted.   4. Active smoker. Recommended to stop smoking. Next on nicotine patch provided.  5. Blurred vision.  currently resolved. CT scan of the head unremarkable.  6. COPD. Inhalers added. Short course of Low-dose prednisone initiated as well.  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 Renal function stable. Monitor closely.  Activity: Currently independent therefore no requirement of physical therapy Bowel regimen: last BM prior to admission Diet: Cardiac diet DVT Prophylaxis: subcutaneous Heparin  Advance goals of care  discussion: Full code  Family Communication: family was present at bedside, at the time of interview. The pt provided permission to discuss medical plan with the family. Opportunity was given to ask question and all questions were answered satisfactorily.   Disposition:  Discharge to home. Expected discharge date: 02/09/2016, pending echocardiogram  Consultants: Cardiology Procedures: Echocardiogram pending  Antibiotics: Anti-infectives    None        Subjective: Feeling better but continues to have shortness of breath. No chest pain.  Objective: Physical Exam: Vitals:   02/08/16 1302 02/08/16 1400 02/08/16 1505 02/08/16 1615  BP: 124/85 (!) 131/94 130/78 (!) 132/93  Pulse: 94 88 89 95  Resp: (!) 21 15 17 17   Temp:    98.2 F (36.8 C)  TempSrc:    Oral  SpO2:   97% 97%  Weight:      Height:        Intake/Output Summary (Last 24 hours) at 02/08/16 1709 Last data filed at 02/08/16 1300  Gross per 24 hour  Intake          1466.92 ml  Output             3050 ml  Net         -1583.08 ml   Filed Weights   02/07/16 1628  Weight: 73.6 kg (162 lb 4.8 oz)    General: Alert, Awake and Oriented to Time, Place and Person. Appear in mild distress, affect appropriate Eyes: PERRL, Conjunctiva normal ENT: Oral Mucosa clear moist. Neck: no JVD, no Abnormal Mass Or lumps Cardiovascular: S1 and S2 Present, no Murmur, Respiratory: Bilateral Air entry equal and Decreased, no use of accessory muscle, Clear to Auscultation, no Crackles, no wheezes Abdomen: Bowel Sound present, Soft  and no tenderness Skin: no redness, no Rash, no induration Extremities: bilateral Pedal edema, no calf tenderness Neurologic: Grossly no focal neuro deficit. Bilaterally Equal motor strength  Data Reviewed: CBC:  Recent Labs Lab 02/07/16 1319  WBC 9.9  HGB 12.0*  HCT 34.4*  MCV 91.5  PLT 175   Basic Metabolic Panel:  Recent Labs Lab 02/07/16 1319 02/08/16 0227  NA 139 137  K 3.9 3.7    CL 108 105  CO2 26 23  GLUCOSE 99 95  BUN 12 15  CREATININE 1.43* 1.52*  CALCIUM 8.8* 9.2    Liver Function Tests:  Recent Labs Lab 02/08/16 0227  AST 20  ALT 16*  ALKPHOS 42  BILITOT 0.5  PROT 8.0  ALBUMIN 3.2*   No results for input(s): LIPASE, AMYLASE in the last 168 hours. No results for input(s): AMMONIA in the last 168 hours. Coagulation Profile:  Recent Labs Lab 02/07/16 1526  INR 1.12   Cardiac Enzymes:  Recent Labs Lab 02/07/16 1526 02/07/16 2031 02/08/16 0227  TROPONINI <0.03 <0.03 <0.03   BNP (last 3 results) No results for input(s): PROBNP in the last 8760 hours.  CBG: No results for input(s): GLUCAP in the last 168 hours.  Studies: Ct Head Wo Contrast  Result Date: 02/08/2016 CLINICAL DATA:  Hypertension and blurred vision EXAM: CT HEAD WITHOUT CONTRAST TECHNIQUE: Contiguous axial images were obtained from the base of the skull through the vertex without intravenous contrast. COMPARISON:  None. FINDINGS: Brain: No mass lesion, intraparenchymal hemorrhage or extra-axial collection. No evidence of acute cortical infarct. Brain parenchyma and CSF-containing spaces are normal for age. Vascular: No hyperdense vessel or unexpected calcification. Skull: Normal visualized skull base, calvarium and extracranial soft tissues. Sinuses/Orbits: Findings of prior functional endoscopic sinus surgery. Partial opacification of the ethmoid and maxillary sinuses with secretions and mucosal thickening. Normal orbits. IMPRESSION: 1. No acute intracranial abnormality. 2. Ethmoid and maxillary sinus disease. Electronically Signed   By: Ulyses Jarred M.D.   On: 02/08/2016 03:44     Scheduled Meds: . aspirin EC  325 mg Oral Daily  . diltiazem  60 mg Oral Q8H  . furosemide  20 mg Oral Daily  . hydrALAZINE  25 mg Oral Q8H  . ipratropium-albuterol  3 mL Nebulization Q6H  . isosorbide mononitrate  30 mg Oral Daily  . mometasone-formoterol  2 puff Inhalation BID  .  nicotine  14 mg Transdermal Daily  . pantoprazole  40 mg Oral Daily  . predniSONE  20 mg Oral Q breakfast  . tiotropium  18 mcg Inhalation Daily   Continuous Infusions: PRN Meds: acetaminophen, albuterol, ALPRAZolam, hydrALAZINE, ondansetron (ZOFRAN) IV  Time spent: 30 minutes  Author: Berle Mull, MD [facility] Pager: [phone] 02/08/2016 5:09 PM  If 7PM-7AM, please contact night-coverage at [email or web address], password [email or web address]

## 2016-02-08 NOTE — Progress Notes (Signed)
   02/08/16 1530  Clinical Encounter Type  Visited With Patient  Visit Type Other (Comment) (Filer consult)  Spiritual Encounters  Spiritual Needs Emotional  Stress Factors  Patient Stress Factors Not reviewed  Introduction to Pt. Provided AD and explained to Pt. Chaplain to follow up tomorrow.

## 2016-02-08 NOTE — Progress Notes (Signed)
Patient transferred to rm. 4NP10.  Patient anxious and states he "feels funny".  HR in 120's.   Blood pressure 134/74.  MD notified and 1800 dose Cardizem given early as Cardizem gtt. Had been discontinued earlier.  Patient also requesting Nicotine patch and Xanax, which were given.

## 2016-02-08 NOTE — Progress Notes (Signed)
Patient converted to SB / SR with rate of 55-70.

## 2016-02-08 NOTE — Progress Notes (Signed)
Pt transported to CT and back with Judson Roch, NT. No complications, pt resting comfortably back in room.

## 2016-02-09 ENCOUNTER — Inpatient Hospital Stay (HOSPITAL_COMMUNITY): Payer: Medicaid Other

## 2016-02-09 DIAGNOSIS — I4891 Unspecified atrial fibrillation: Secondary | ICD-10-CM

## 2016-02-09 DIAGNOSIS — J439 Emphysema, unspecified: Secondary | ICD-10-CM

## 2016-02-09 LAB — ECHOCARDIOGRAM COMPLETE
HEIGHTINCHES: 72 in
Weight: 2580.8 oz

## 2016-02-09 LAB — BASIC METABOLIC PANEL
Anion gap: 7 (ref 5–15)
BUN: 25 mg/dL — AB (ref 6–20)
CALCIUM: 8.9 mg/dL (ref 8.9–10.3)
CHLORIDE: 105 mmol/L (ref 101–111)
CO2: 24 mmol/L (ref 22–32)
CREATININE: 1.86 mg/dL — AB (ref 0.61–1.24)
GFR calc non Af Amer: 38 mL/min — ABNORMAL LOW (ref >60)
GFR, EST AFRICAN AMERICAN: 44 mL/min — AB (ref >60)
Glucose, Bld: 130 mg/dL — ABNORMAL HIGH (ref 65–99)
Potassium: 4.2 mmol/L (ref 3.5–5.1)
SODIUM: 136 mmol/L (ref 135–145)

## 2016-02-09 LAB — MAGNESIUM: MAGNESIUM: 1.9 mg/dL (ref 1.7–2.4)

## 2016-02-09 MED ORDER — ENOXAPARIN SODIUM 40 MG/0.4ML ~~LOC~~ SOLN
40.0000 mg | SUBCUTANEOUS | Status: DC
  Administered 2016-02-09: 40 mg via SUBCUTANEOUS
  Filled 2016-02-09: qty 0.4

## 2016-02-09 MED ORDER — INFLUENZA VAC SPLIT QUAD 0.5 ML IM SUSY
0.5000 mL | PREFILLED_SYRINGE | INTRAMUSCULAR | Status: AC
  Administered 2016-02-10: 0.5 mL via INTRAMUSCULAR
  Filled 2016-02-09: qty 0.5

## 2016-02-09 MED ORDER — HYDRALAZINE HCL 50 MG PO TABS
50.0000 mg | ORAL_TABLET | Freq: Three times a day (TID) | ORAL | Status: DC
  Administered 2016-02-09 – 2016-02-10 (×3): 50 mg via ORAL
  Filled 2016-02-09 (×3): qty 1

## 2016-02-09 MED ORDER — PNEUMOCOCCAL VAC POLYVALENT 25 MCG/0.5ML IJ INJ
0.5000 mL | INJECTION | INTRAMUSCULAR | Status: AC
  Administered 2016-02-10: 0.5 mL via INTRAMUSCULAR
  Filled 2016-02-09: qty 0.5

## 2016-02-09 MED ORDER — GUAIFENESIN ER 600 MG PO TB12
600.0000 mg | ORAL_TABLET | Freq: Two times a day (BID) | ORAL | Status: DC | PRN
  Administered 2016-02-09: 600 mg via ORAL
  Filled 2016-02-09: qty 1

## 2016-02-09 MED ORDER — ALBUTEROL SULFATE (2.5 MG/3ML) 0.083% IN NEBU: 2.5000 mg | INHALATION_SOLUTION | RESPIRATORY_TRACT | Status: DC | PRN

## 2016-02-09 MED ORDER — PREDNISONE 10 MG PO TABS
10.0000 mg | ORAL_TABLET | Freq: Every day | ORAL | Status: AC
  Administered 2016-02-10: 10 mg via ORAL
  Filled 2016-02-09: qty 1

## 2016-02-09 NOTE — Progress Notes (Signed)
  Echocardiogram 2D Echocardiogram has been performed.  Jennette Dubin 02/09/2016, 3:20 PM

## 2016-02-09 NOTE — Progress Notes (Signed)
PROGRESS NOTE        PATIENT DETAILS Name: Kevin [name] Age: 60 y.o. Sex: male Date of Birth: 15-Dec-1955 Admit Date: 02/07/2016 Admitting Physician Elwin Mocha, MD PIR:JJOAC A Avbuere, MD  Brief Narrative: Patient is a 60 y.o. male with past medical history of hypertension, chronic kidney disease stage III admitted with A. fib RVR and hypertensive emergency. See below for further details  Subjective: Back in sinus rhythm-lying comfortably in bed.  Assessment/Plan: Hypertensive emergency: resolved, initially required Cardene infusion. Blood pressure better controlled-but still on the higher side-increase hydralazine to 50 mg 3 times a day, continue Imdur and her diltiazem and follow.  A. fib with RVR: Back in sinus rhythm, continue Cardizem.Chads2vasc of 1-per cardiology-not a great anticoagulation candidate given noncompliance to follow-up/CKD-recommendations are to manage without anticoagulation. Continue aspirin.Await Echo  Chronic kidney disease stage III: Creatinine close to usual baseline, follow.  OSA: cardiology will arrange f/u with Dr Fransico Him for sleep study  COPD: Stable, no evidence of exacerbation. Anterior inhalers and Taper off prednisone  Tobacco abuse: Counseled, continue transdermal nicotine  DVT Prophylaxis: Prophylactic Lovenox   Code Status: Full code   Family Communication: Daughter at bedside  Disposition Plan: Remain inpatient-but will plan on Home health vs SNF on discharge  Antimicrobial agents: None  Procedures: None  CONSULTS:  cardiology  Time spent: 25 minutes-Greater than 50% of this time was spent in counseling, explanation of diagnosis, planning of further management, and coordination of care.  MEDICATIONS: Anti-infectives    None      Scheduled Meds: . aspirin EC  325 mg Oral Daily  . diltiazem  60 mg Oral Q8H  . furosemide  20 mg Oral Daily  . hydrALAZINE  25 mg Oral Q8H  .  Influenza vac split quadrivalent PF  0.5 mL Intramuscular Tomorrow-1000  . isosorbide mononitrate  30 mg Oral Daily  . mometasone-formoterol  2 puff Inhalation BID  . nicotine  14 mg Transdermal Daily  . pantoprazole  40 mg Oral Daily  . pneumococcal 23 valent vaccine  0.5 mL Intramuscular Tomorrow-1000  . predniSONE  20 mg Oral Q breakfast  . tiotropium  18 mcg Inhalation Daily   Continuous Infusions: PRN Meds:.acetaminophen, albuterol, ALPRAZolam, hydrALAZINE, ondansetron (ZOFRAN) IV   PHYSICAL EXAM: Vital signs: Vitals:   02/09/16 0700 02/09/16 0800 02/09/16 0912 02/09/16 1100  BP: (!) 160/88 (!) 151/56  (!) 176/90  Pulse: 74 63  74  Resp:      Temp: 98.1 F (36.7 C)   98.1 F (36.7 C)  TempSrc: Oral   Oral  SpO2: 93% 98% 98% 98%  Weight:      Height:       Filed Weights   02/07/16 1628 02/09/16 0357  Weight: 73.6 kg (162 lb 4.8 oz) 73.2 kg (161 lb 4.8 oz)   Body mass index is 21.88 kg/m.   General appearance :Awake, alert, not in any distress. Speech Clear. Not toxic Looking Eyes:, pupils equally reactive to light and accomodation,no scleral icterus.Pink conjunctiva HEENT: Atraumatic and Normocephalic Neck: supple, no JVD. No cervical lymphadenopathy. No thyromegaly Resp:Good air entry bilaterally, no added sounds  CVS: S1 S2 regular, no murmurs.  GI: Bowel sounds present, Non tender and not distended with no gaurding, rigidity or rebound.No organomegaly Extremities: B/L Lower Ext shows no edema, both legs are warm to touch Neurology:  speech clear,Non focal, sensation is grossly intact. Psychiatric: Normal judgment and insight. Alert and oriented x 3. Normal mood. Musculoskeletal:No digital cyanosis Skin:No Rash, warm and dry Wounds:N/A  I have personally reviewed following labs and imaging studies  LABORATORY DATA: CBC:  Recent Labs Lab 02/07/16 1319  WBC 9.9  HGB 12.0*  HCT 34.4*  MCV 91.5  PLT 128    Basic Metabolic Panel:  Recent Labs Lab  02/07/16 1319 02/08/16 0227 02/09/16 0154  NA 139 137 136  K 3.9 3.7 4.2  CL 108 105 105  CO2 26 23 24   GLUCOSE 99 95 130*  BUN 12 15 25*  CREATININE 1.43* 1.52* 1.86*  CALCIUM 8.8* 9.2 8.9  MG  --   --  1.9    GFR: Estimated Creatinine Clearance: 44.8 mL/min (by C-G formula based on SCr of 1.86 mg/dL (H)).  Liver Function Tests:  Recent Labs Lab 02/08/16 0227  AST 20  ALT 16*  ALKPHOS 42  BILITOT 0.5  PROT 8.0  ALBUMIN 3.2*   No results for input(s): LIPASE, AMYLASE in the last 168 hours. No results for input(s): AMMONIA in the last 168 hours.  Coagulation Profile:  Recent Labs Lab 02/07/16 1526  INR 1.12    Cardiac Enzymes:  Recent Labs Lab 02/07/16 1526 02/07/16 2031 02/08/16 0227  TROPONINI <0.03 <0.03 <0.03    BNP (last 3 results) No results for input(s): PROBNP in the last 8760 hours.  HbA1C: No results for input(s): HGBA1C in the last 72 hours.  CBG: No results for input(s): GLUCAP in the last 168 hours.  Lipid Profile: No results for input(s): CHOL, HDL, LDLCALC, TRIG, CHOLHDL, LDLDIRECT in the last 72 hours.  Thyroid Function Tests:  Recent Labs  02/07/16 1526  TSH 0.755    Anemia Panel: No results for input(s): VITAMINB12, FOLATE, FERRITIN, TIBC, IRON, RETICCTPCT in the last 72 hours.  Urine analysis:    Component Value Date/Time   COLORURINE YELLOW 07/15/2015 1015   APPEARANCEUR HAZY (A) 07/15/2015 1015   LABSPEC 1.017 07/15/2015 1015   PHURINE 5.5 07/15/2015 1015   GLUCOSEU NEGATIVE 07/15/2015 1015   HGBUR NEGATIVE 07/15/2015 1015   BILIRUBINUR NEGATIVE 07/15/2015 1015   KETONESUR NEGATIVE 07/15/2015 1015   PROTEINUR NEGATIVE 07/15/2015 1015   UROBILINOGEN 0.2 01/17/2013 0022   NITRITE NEGATIVE 07/15/2015 1015   LEUKOCYTESUR NEGATIVE 07/15/2015 1015    Sepsis Labs: Lactic Acid, Venous No results found for: LATICACIDVEN  MICROBIOLOGY: Recent Results (from the past 240 hour(s))  MRSA PCR Screening     Status:  None   Collection Time: 02/07/16  6:57 PM  Result Value Ref Range Status   MRSA by PCR NEGATIVE NEGATIVE Final    Comment:        The GeneXpert MRSA Assay (FDA approved for NASAL specimens only), is one component of a comprehensive MRSA colonization surveillance program. It is not intended to diagnose MRSA infection nor to guide or monitor treatment for MRSA infections.     RADIOLOGY STUDIES/RESULTS: Dg Chest 2 View  Result Date: 02/07/2016 CLINICAL DATA:  Shortness of breath and chest pain since yesterday, sudden episode of shortness of breath with chest tightness, history hypertension, smoking EXAM: CHEST  2 VIEW COMPARISON:  None FINDINGS: Normal heart size, mediastinal contours, and pulmonary vascularity. Mild central peribronchial thickening and slight hyperinflation question COPD. No pulmonary infiltrate, pleural effusion or pneumothorax. Osseous structures unremarkable. IMPRESSION: Peribronchial thickening and hyperinflation question COPD. No acute infiltrate. Electronically Signed   By: Crist Infante.D.  On: 02/07/2016 13:40   Ct Head Wo Contrast  Result Date: 02/08/2016 CLINICAL DATA:  Hypertension and blurred vision EXAM: CT HEAD WITHOUT CONTRAST TECHNIQUE: Contiguous axial images were obtained from the base of the skull through the vertex without intravenous contrast. COMPARISON:  None. FINDINGS: Brain: No mass lesion, intraparenchymal hemorrhage or extra-axial collection. No evidence of acute cortical infarct. Brain parenchyma and CSF-containing spaces are normal for age. Vascular: No hyperdense vessel or unexpected calcification. Skull: Normal visualized skull base, calvarium and extracranial soft tissues. Sinuses/Orbits: Findings of prior functional endoscopic sinus surgery. Partial opacification of the ethmoid and maxillary sinuses with secretions and mucosal thickening. Normal orbits. IMPRESSION: 1. No acute intracranial abnormality. 2. Ethmoid and maxillary sinus disease.  Electronically Signed   By: Ulyses Jarred M.D.   On: 02/08/2016 03:44     LOS: 1 day   Oren Binet, MD  [facility] Pager:[phone]  If 7PM-7AM, please contact night-coverage [email or web address] Password [email or web address] 02/09/2016, 2:20 PM

## 2016-02-09 NOTE — Progress Notes (Signed)
Patient Name: Kevin [name] Date of Encounter: 02/09/2016  Primary Cardiologist: [facility] Problem List     Principal Problem:   Hypertensive emergency Active Problems:   Shortness of breath   Atrial fibrillation (HCC)   Tobacco abuse   CKD (chronic kidney disease)   COPD (chronic obstructive pulmonary disease) (HCC)   Blurred vision, bilateral   Dyspnea     Subjective   Feels well converted to NSR   Inpatient Medications    Scheduled Meds: . aspirin EC  325 mg Oral Daily  . diltiazem  60 mg Oral Q8H  . furosemide  20 mg Oral Daily  . hydrALAZINE  25 mg Oral Q8H  . Influenza vac split quadrivalent PF  0.5 mL Intramuscular Tomorrow-1000  . ipratropium-albuterol  3 mL Nebulization Q6H  . isosorbide mononitrate  30 mg Oral Daily  . mometasone-formoterol  2 puff Inhalation BID  . nicotine  14 mg Transdermal Daily  . pantoprazole  40 mg Oral Daily  . pneumococcal 23 valent vaccine  0.5 mL Intramuscular Tomorrow-1000  . predniSONE  20 mg Oral Q breakfast  . tiotropium  18 mcg Inhalation Daily   Continuous Infusions:  PRN Meds: acetaminophen, albuterol, ALPRAZolam, hydrALAZINE, ondansetron (ZOFRAN) IV   Vital Signs    Vitals:   02/09/16 0139 02/09/16 0357 02/09/16 0500 02/09/16 0700  BP:  (!) 155/109 (!) 141/64 (!) 160/88  Pulse:  82 72 74  Resp:  13    Temp:  98.2 F (36.8 C)  98.1 F (36.7 C)  TempSrc:  Oral  Oral  SpO2: 99% 98% 95% 93%  Weight:  161 lb 4.8 oz (73.2 kg)    Height:        Intake/Output Summary (Last 24 hours) at 02/09/16 0832 Last data filed at 02/09/16 0405  Gross per 24 hour  Intake           580.67 ml  Output              250 ml  Net           330.67 ml   Filed Weights   02/07/16 1628 02/09/16 0357  Weight: 162 lb 4.8 oz (73.6 kg) 161 lb 4.8 oz (73.2 kg)    Physical Exam    GEN: Disheveled black male  HEENT: Grossly normal.  Neck: Supple, no JVD, carotid bruits, or masses. Cardiac: RRR, no murmurs, rubs, or  gallops. No clubbing, cyanosis, edema.  Radials/DP/PT 2+ and equal bilaterally.  Respiratory:  Respirations regular and unlabored, clear to auscultation bilaterally. GI: Soft, nontender, nondistended, BS + x 4. MS: no deformity or atrophy. Skin: warm and dry, no rash. Neuro:  Strength and sensation are intact. Psych: AAOx3.  Normal affect.  Labs    CBC  Recent Labs  02/07/16 1319  WBC 9.9  HGB 12.0*  HCT 34.4*  MCV 91.5  PLT 542   Basic Metabolic Panel  Recent Labs  02/08/16 0227 02/09/16 0154  NA 137 136  K 3.7 4.2  CL 105 105  CO2 23 24  GLUCOSE 95 130*  BUN 15 25*  CREATININE 1.52* 1.86*  CALCIUM 9.2 8.9  MG  --  1.9   Cardiac Enzymes  Recent Labs  02/07/16 1526 02/07/16 2031 02/08/16 0227  TROPONINI <0.03 <0.03 <0.03   Thyroid Function Tests  Recent Labs  02/07/16 1526  TSH 0.755    Telemetry    Afib rate 114  - Personally Reviewed  ECG  Afib LVH  - Personally Reviewed  Radiology    Dg Chest 2 View  Result Date: 02/07/2016 CLINICAL DATA:  Shortness of breath and chest pain since yesterday, sudden episode of shortness of breath with chest tightness, history hypertension, smoking EXAM: CHEST  2 VIEW COMPARISON:  None FINDINGS: Normal heart size, mediastinal contours, and pulmonary vascularity. Mild central peribronchial thickening and slight hyperinflation question COPD. No pulmonary infiltrate, pleural effusion or pneumothorax. Osseous structures unremarkable. IMPRESSION: Peribronchial thickening and hyperinflation question COPD. No acute infiltrate. Electronically Signed   By: Lavonia Dana M.D.   On: 02/07/2016 13:40   Ct Head Wo Contrast  Result Date: 02/08/2016 CLINICAL DATA:  Hypertension and blurred vision EXAM: CT HEAD WITHOUT CONTRAST TECHNIQUE: Contiguous axial images were obtained from the base of the skull through the vertex without intravenous contrast. COMPARISON:  None. FINDINGS: Brain: No mass lesion, intraparenchymal  hemorrhage or extra-axial collection. No evidence of acute cortical infarct. Brain parenchyma and CSF-containing spaces are normal for age. Vascular: No hyperdense vessel or unexpected calcification. Skull: Normal visualized skull base, calvarium and extracranial soft tissues. Sinuses/Orbits: Findings of prior functional endoscopic sinus surgery. Partial opacification of the ethmoid and maxillary sinuses with secretions and mucosal thickening. Normal orbits. IMPRESSION: 1. No acute intracranial abnormality. 2. Ethmoid and maxillary sinus disease. Electronically Signed   By: Ulyses Jarred M.D.   On: 02/08/2016 03:44    Cardiac Studies   Echo pending   Patient Profile     60 y.o. poor medical f/u admitted with   Assessment & Plan    HTN:  Continue cardizem and bidil components improved given elevated Cr no ARB/ACE  Afib: converted to NSR given low CHAVASC score , elevated Cr and poor medical f/u no anticoagulation OSA: wife concerned and can contribute to PAF will arrange f/u with Dr Fransico Him for sleep study  Ambulate awaiting echo possible d/c in am  Outpatient cardiology f/u [facility]  Signed, Jenkins Rouge, MD  02/09/2016, 8:32 AM

## 2016-02-10 DIAGNOSIS — I480 Paroxysmal atrial fibrillation: Secondary | ICD-10-CM

## 2016-02-10 DIAGNOSIS — Z720 Tobacco use: Secondary | ICD-10-CM

## 2016-02-10 DIAGNOSIS — I119 Hypertensive heart disease without heart failure: Secondary | ICD-10-CM

## 2016-02-10 MED ORDER — FUROSEMIDE 10 MG/ML IJ SOLN
60.0000 mg | Freq: Once | INTRAMUSCULAR | Status: AC
  Administered 2016-02-10: 60 mg via INTRAVENOUS
  Filled 2016-02-10: qty 6

## 2016-02-10 MED ORDER — CARVEDILOL 6.25 MG PO TABS
6.2500 mg | ORAL_TABLET | Freq: Two times a day (BID) | ORAL | Status: DC
  Administered 2016-02-10 (×2): 6.25 mg via ORAL
  Filled 2016-02-10 (×2): qty 1

## 2016-02-10 MED ORDER — DILTIAZEM HCL ER COATED BEADS 180 MG PO CP24
180.0000 mg | ORAL_CAPSULE | Freq: Every day | ORAL | Status: DC
  Administered 2016-02-10: 180 mg via ORAL
  Filled 2016-02-10: qty 1

## 2016-02-10 MED ORDER — MOMETASONE FURO-FORMOTEROL FUM 100-5 MCG/ACT IN AERO: 2.0000 | INHALATION_SPRAY | Freq: Two times a day (BID) | RESPIRATORY_TRACT | 0 refills | Status: DC

## 2016-02-10 MED ORDER — POTASSIUM CHLORIDE ER 10 MEQ PO TBCR: 20.0000 meq | EXTENDED_RELEASE_TABLET | Freq: Every day | ORAL | 0 refills | Status: DC

## 2016-02-10 MED ORDER — HYDRALAZINE HCL 20 MG/ML IJ SOLN
5.0000 mg | Freq: Once | INTRAMUSCULAR | Status: AC
  Administered 2016-02-10: 5 mg via INTRAVENOUS
  Filled 2016-02-10: qty 1

## 2016-02-10 MED ORDER — FUROSEMIDE 40 MG PO TABS: 40.0000 mg | ORAL_TABLET | Freq: Every day | ORAL | Status: DC

## 2016-02-10 MED ORDER — CARVEDILOL 6.25 MG PO TABS: 6.2500 mg | ORAL_TABLET | Freq: Two times a day (BID) | ORAL | 0 refills | Status: DC

## 2016-02-10 MED ORDER — ISOSORBIDE MONONITRATE ER 30 MG PO TB24: 30.0000 mg | ORAL_TABLET | Freq: Every day | ORAL | 0 refills | Status: DC

## 2016-02-10 MED ORDER — ASPIRIN 325 MG PO TBEC: 325.0000 mg | DELAYED_RELEASE_TABLET | Freq: Every day | ORAL | 0 refills | Status: DC

## 2016-02-10 MED ORDER — TIOTROPIUM BROMIDE MONOHYDRATE 18 MCG IN CAPS: 18.0000 ug | ORAL_CAPSULE | Freq: Every day | RESPIRATORY_TRACT | 0 refills | Status: DC

## 2016-02-10 MED ORDER — DILTIAZEM HCL ER COATED BEADS 180 MG PO CP24: 180.0000 mg | ORAL_CAPSULE | Freq: Every day | ORAL | 0 refills | Status: DC

## 2016-02-10 MED ORDER — ALBUTEROL SULFATE HFA 108 (90 BASE) MCG/ACT IN AERS: 2.0000 | INHALATION_SPRAY | Freq: Four times a day (QID) | RESPIRATORY_TRACT | 0 refills | Status: DC | PRN

## 2016-02-10 MED ORDER — HYDRALAZINE HCL 50 MG PO TABS: 50.0000 mg | ORAL_TABLET | Freq: Three times a day (TID) | ORAL | 0 refills | Status: DC

## 2016-02-10 MED ORDER — FUROSEMIDE 40 MG PO TABS: 40.0000 mg | ORAL_TABLET | Freq: Every day | ORAL | 0 refills | Status: DC

## 2016-02-10 NOTE — Progress Notes (Signed)
Patient with no complaints or concerns during 7pm - 7am shift.  Tamarion Haymond, RN 

## 2016-02-10 NOTE — Progress Notes (Signed)
Pt is alert and oriented up walking this morning with shortness of breath, states that had a slight headache with no medication intervention at this time. Iv lasix was give, patient walked again and felt better with no shortness of breath, Vital signs stable, Wife and daughter updated, Paged attending to answer questions and concerns, Cardiologist to round to determine discharge today.

## 2016-02-10 NOTE — Progress Notes (Addendum)
Hydralazine 5 mg ordered and given.  Will continue to monitor.   Adriona Kaney, RN

## 2016-02-10 NOTE — Discharge Instructions (Signed)
Hypertension Hypertension is another name for high blood pressure. High blood pressure forces your heart to work harder to pump blood. A blood pressure reading has two numbers, which includes a higher number over a lower number (example: 110/72). Follow these instructions at home:  Have your blood pressure rechecked by your doctor.  Only take medicine as told by your doctor. Follow the directions carefully. The medicine does not work as well if you skip doses. Skipping doses also puts you at risk for problems.  Do not smoke.  Monitor your blood pressure at home as told by your doctor. Contact a doctor if:  You think you are having a reaction to the medicine you are taking.  You have repeat headaches or feel dizzy.  You have puffiness (swelling) in your ankles.  You have trouble with your vision. Get help right away if:  You get a very bad headache and are confused.  You feel weak, numb, or faint.  You get chest or belly (abdominal) pain.  You throw up (vomit).  You cannot breathe very well. This information is not intended to replace advice given to you by your health care provider. Make sure you discuss any questions you have with your health care provider. Document Released: 07/19/2007 Document Revised: 07/08/2015 Document Reviewed: 11/22/2012 Elsevier Interactive Patient Education  2017 [employer].  Atrial Fibrillation Introduction Atrial fibrillation is a type of heartbeat that is irregular or fast (rapid). If you have this condition, your heart keeps quivering in a weird (chaotic) way. This condition can make it so your heart cannot pump blood normally. Having this condition gives a person more risk for stroke, heart failure, and other heart problems. There are different types of atrial fibrillation. Talk with your doctor to learn about the type that you have. Follow these instructions at home:  Take over-the-counter and prescription medicines only as told by your  doctor.  If your doctor prescribed a blood-thinning medicine, take it exactly as told. Taking too much of it can cause bleeding. If you do not take enough of it, you will not have the protection that you need against stroke and other problems.  Do not use any tobacco products. These include cigarettes, chewing tobacco, and e-cigarettes. If you need help quitting, ask your doctor.  If you have apnea (obstructive sleep apnea), manage it as told by your doctor.  Do not drink alcohol.  Do not drink beverages that have caffeine. These include coffee, soda, and tea.  Maintain a healthy weight. Do not use diet pills unless your doctor says they are safe for you. Diet pills may make heart problems worse.  Follow diet instructions as told by your doctor.  Exercise regularly as told by your doctor.  Keep all follow-up visits as told by your doctor. This is important. Contact a doctor if:  You notice a change in the speed, rhythm, or strength of your heartbeat.  You are taking a blood-thinning medicine and you notice more bruising.  You get tired more easily when you move or exercise. Get help right away if:  You have pain in your chest or your belly (abdomen).  You have sweating or weakness.  You feel sick to your stomach (nauseous).  You notice blood in your throw up (vomit), poop (stool), or pee (urine).  You are short of breath.  You suddenly have swollen feet and ankles.  You feel dizzy.  Your suddenly get weak or numb in your face, arms, or legs, especially if  it happens on one side of your body.  You have trouble talking, trouble understanding, or both.  Your face or your eyelid droops on one side. These symptoms may be an emergency. Do not wait to see if the symptoms will go away. Get medical help right away. Call your local emergency services (911 in the [place] Do not drive yourself to the hospital.  This information is not intended to replace advice given to you by your  health care provider. Make sure you discuss any questions you have with your health care provider. Document Released: 11/09/2007 Document Revised: 07/08/2015 Document Reviewed: 05/27/2014  2017 Elsevier

## 2016-02-10 NOTE — Progress Notes (Signed)
Patient Name: Kevin [name] Date of Encounter: 02/10/2016  Primary Cardiologist: [facility] Problem List     Principal Problem:   Hypertensive emergency Active Problems:   Shortness of breath   PAF (paroxysmal atrial fibrillation) (HCC)   Tobacco abuse   Acute on chronic renal insufficiency   COPD (chronic obstructive pulmonary disease) (HCC)   Blurred vision, bilateral   Dyspnea   Hypertensive cardiovascular disease     Subjective   SOB improved  Inpatient Medications    Scheduled Meds: . aspirin EC  325 mg Oral Daily  . carvedilol  6.25 mg Oral BID WC  . diltiazem  180 mg Oral Daily  . enoxaparin (LOVENOX) injection  40 mg Subcutaneous Q24H  . hydrALAZINE  50 mg Oral Q8H  . Influenza vac split quadrivalent PF  0.5 mL Intramuscular Tomorrow-1000  . isosorbide mononitrate  30 mg Oral Daily  . mometasone-formoterol  2 puff Inhalation BID  . nicotine  14 mg Transdermal Daily  . pantoprazole  40 mg Oral Daily  . pneumococcal 23 valent vaccine  0.5 mL Intramuscular Tomorrow-1000  . tiotropium  18 mcg Inhalation Daily   Continuous Infusions:  PRN Meds: acetaminophen, albuterol, ALPRAZolam, guaiFENesin, hydrALAZINE, ondansetron (ZOFRAN) IV   Vital Signs    Vitals:   02/10/16 0101 02/10/16 0507 02/10/16 0750 02/10/16 1100  BP: (!) 175/92 (!) 151/72  (!) 150/88  Pulse: 61 75  75  Resp: 18 18    Temp: 98.9 F (37.2 C) 98 F (36.7 C)  98.2 F (36.8 C)  TempSrc: Oral Oral    SpO2: 99% 98% 98% 98%  Weight:  162 lb 8 oz (73.7 kg)    Height:        Intake/Output Summary (Last 24 hours) at 02/10/16 1422 Last data filed at 02/10/16 1250  Gross per 24 hour  Intake              860 ml  Output             2325 ml  Net            -1465 ml   Filed Weights   02/09/16 0357 02/09/16 1427 02/10/16 0507  Weight: 161 lb 4.8 oz (73.2 kg) 163 lb 4.8 oz (74.1 kg) 162 lb 8 oz (73.7 kg)    Physical Exam    GEN: Well nourished, well developed, in no acute  distress.  HEENT: Grossly normal.  Neck: Supple, no JVD, carotid bruits, or masses. Cardiac: RRR, no murmurs, rubs, or gallops. No clubbing, cyanosis, edema.  Radials/DP/PT 2+ and equal bilaterally.  Respiratory:  Respirations regular and unlabored, clear to auscultation bilaterally. GI: Soft, nontender, nondistended, BS + x 4. MS: no deformity or atrophy. Skin: warm and dry, no rash. Neuro:  Strength and sensation are intact. Psych: AAOx3.  Normal affect.  Labs    CBC No results for input(s): WBC, NEUTROABS, HGB, HCT, MCV, PLT in the last 72 hours. Basic Metabolic Panel  Recent Labs  02/08/16 0227 02/09/16 0154  NA 137 136  K 3.7 4.2  CL 105 105  CO2 23 24  GLUCOSE 95 130*  BUN 15 25*  CREATININE 1.52* 1.86*  CALCIUM 9.2 8.9  MG  --  1.9   Liver Function Tests  Recent Labs  02/08/16 0227  AST 20  ALT 16*  ALKPHOS 42  BILITOT 0.5  PROT 8.0  ALBUMIN 3.2*   No results for input(s): LIPASE, AMYLASE in the last 72  hours. Cardiac Enzymes  Recent Labs  02/07/16 1526 02/07/16 2031 02/08/16 0227  TROPONINI <0.03 <0.03 <0.03   BNP Invalid input(s): POCBNP D-Dimer No results for input(s): DDIMER in the last 72 hours. Hemoglobin A1C No results for input(s): HGBA1C in the last 72 hours. Fasting Lipid Panel No results for input(s): CHOL, HDL, LDLCALC, TRIG, CHOLHDL, LDLDIRECT in the last 72 hours. Thyroid Function Tests  Recent Labs  02/07/16 1526  TSH 0.755    Telemetry    NSR - Personally Reviewed  ECG    02/09/16 NSR with LVH - Personally Reviewed  Radiology    No results found.  Cardiac Studies   Echo 02/09/16 Study Conclusions  - Left ventricle: The cavity size was normal. There was moderate   concentric hypertrophy. Systolic function was vigorous. The   estimated ejection fraction was in the range of 65% to 70%. Wall   motion was normal; there were no regional wall motion   abnormalities. Doppler parameters are consistent with  abnormal   left ventricular relaxation (grade 1 diastolic dysfunction).   Doppler parameters are consistent with elevated ventricular   end-diastolic filling pressure. - Aortic valve: Trileaflet; normal thickness leaflets. There was no   regurgitation. - Aortic root: The aortic root was normal in size. - Ascending aorta: The ascending aorta was normal in size. - Mitral valve: Structurally normal valve. There was no   regurgitation. - Left atrium: The atrium was normal in size. - Right ventricle: The cavity size was normal. Wall thickness was   normal. Systolic function was normal. - Right atrium: The atrium was normal in size. - Tricuspid valve: There was no regurgitation. - Pulmonary arteries: Systolic pressure could not be accurately   estimated. - Inferior vena cava: The vessel was dilated. The respirophasic   diameter changes were blunted (< 50%), consistent with elevated   central venous pressure. - Pericardium, extracardiac: There was no pericardial effusion.   Patient Profile     60 y/o AA male with HTN, non compliance as an OP, admitted 02/07/16 with uncontrolled HTN, AF with RVR, and CHF.   Assessment & Plan    HTN:  Continue cardizem and bidil components improved given elevated Cr no ARB/ACE   Afib: converted to NSR given low CHAVASC score , elevated Cr and poor medical f/u no anticoagulation  OSA: wife concerned and can contribute to PAF will arrange f/u with Dr Fransico Him for sleep study  Acute on chronic renal- he will need TOC f/u in 7-14 days.  Signed, Kerin Ransom, PA-C  02/10/2016, 2:22 PM   Personally seen and examined. Agree with above.  OK for DC today.   No ACE/ARB with renal impairment.   Meds as above OK from AFIB perspective. HTN per primary team.   Candee Furbish, MD

## 2016-02-10 NOTE — Discharge Summary (Signed)
PATIENT DETAILS Name: Kevin [name] Age: 60 y.o. Sex: male Date of Birth: [birth year] MRN: [id number]. Admitting Physician: Elwin Mocha, MD RFF:[id number] Bunnie Domino, MD  Admit Date: 02/07/2016 Discharge date: 02/10/2016  Recommendations for Outpatient Follow-up:  1. Follow up with PCP in 1-2 weeks 2. Please ensure follow-up with cardiology 3. Emphasize compliance to medications and to follow-up 4. Please obtain BMP/CBC in one week   Admitted From:  Home  Disposition: [place]: No  Equipment/Devices: None  Discharge Condition: Stable  CODE STATUS: FULL CODE  Diet recommendation:  Heart Healthy / Carb Modified / Regular / Dysphagia 1/2/3 with full aspiration precautions  Brief Summary: See H&P, Labs, Consult and Test reports for all details in brief,Patient is a 60 y.o. male with past medical history of hypertension, chronic kidney disease stage III admitted with A. fib RVR and hypertensive emergency. See below for further details  Brief Hospital Course: Hypertensive emergency: resolved, initially required Cardene infusion. Blood pressure better controlled-continue hydralazine, Imdur, diltiazem and Coreg. Further optimization deferred to the outpatient setting at follow-up with PCP or cardiology.   A. fib with RVR: Back in sinus rhythm, continue Cardizem.Chads2vasc of 1-per cardiology-not a great anticoagulation candidate given noncompliance to follow-up/CKD-recommendations are to manage without anticoagulation. Continue aspirin.Echocardiogram showed preserved ejection fraction with grade 1 diastolic dysfunction.  Chronic kidney disease stage III: Creatinine close to usual baseline, follow.  OSA: cardiology will arrange f/u with Dr Fransico Him for sleep study  COPD: Stable, no evidence of exacerbation. Continue inhalers, has been tapered off prednisone  Tobacco abuse: Counseled, continue transdermal nicotine   Procedures/Studies: Echo  12/27>>EF 65-99,JTTSV 1 diastolic dysfunction  Discharge Diagnoses:  Principal Problem:   Hypertensive emergency Active Problems:   Shortness of breath   PAF (paroxysmal atrial fibrillation) (HCC)   Tobacco abuse   Acute on chronic renal insufficiency   COPD (chronic obstructive pulmonary disease) (HCC)   Blurred vision, bilateral   Dyspnea   Hypertensive cardiovascular disease   Discharge Instructions:  Activity:  As tolerated with Full fall precautions use walker/cane & assistance as needed   Discharge Instructions    (HEART FAILURE PATIENTS) Call MD:  Anytime you have any of the following symptoms: 1) 3 pound weight gain in 24 hours or 5 pounds in 1 week 2) shortness of breath, with or without a dry hacking cough 3) swelling in the hands, feet or stomach 4) if you have to sleep on extra pillows at night in order to breathe.    Complete by:  As directed    Call MD for:  difficulty breathing, headache or visual disturbances    Complete by:  As directed    Diet - low sodium heart healthy    Complete by:  As directed    Discharge instructions    Complete by:  As directed    You have Congestive Heart Failure: Please call your Cardiologist or Primary MD-Anytime you have any of the following symptoms:   1) 3 pound weight gain in 24 hours or 5 pounds in 1 week  2) shortness of breath, with or without a dry hacking cough 3) swelling in the hands, feet or stomach 4) if you have to sleep on extra pillows at night in order to breathe  Follow cardiac low salt diet and 1.5 lit/day fluid restriction.   Follow with Primary MD  Philis Fendt, MD  and Dr Debara Pickett  Please get a complete blood count and chemistry panel checked by  your Primary MD at your next visit, and again as instructed by your Primary MD.   Stop Smoking !  Get Medicines reviewed and adjusted: Please take all your medications with you for your next visit with your Primary MD  Laboratory/radiological data: Please  request your Primary MD to go over all hospital tests and procedure/radiological results at the follow up, please ask your Primary MD to get all Hospital records sent to his/her office.  In some cases, they will be blood work, cultures and biopsy results pending at the time of your discharge. Please request that your primary care M.D. follows up on these results.  Also Note the following: If you experience worsening of your admission symptoms, develop shortness of breath, life threatening emergency, suicidal or homicidal thoughts you must seek medical attention immediately by calling 911 or calling your MD immediately  if symptoms less severe.  You must read complete instructions/literature along with all the possible adverse reactions/side effects for all the Medicines you take and that have been prescribed to you. Take any new Medicines after you have completely understood and accpet all the possible adverse reactions/side effects.   Do not drive when taking Pain medications or sleeping medications (Benzodaizepines)  Do not take more than prescribed Pain, Sleep and Anxiety Medications. It is not advisable to combine anxiety,sleep and pain medications without talking with your primary care practitioner  Special Instructions: If you have smoked or chewed Tobacco  in the last 2 yrs please stop smoking, stop any regular Alcohol  and or any Recreational drug use.  Wear Seat belts while driving.  Please note: You were cared for by a hospitalist during your hospital stay. Once you are discharged, your primary care physician will handle any further medical issues. Please note that NO REFILLS for any discharge medications will be authorized once you are discharged, as it is imperative that you return to your primary care physician (or establish a relationship with a primary care physician if you do not have one) for your post hospital discharge needs so that they can reassess your need for medications and  monitor your lab values.   Increase activity slowly    Complete by:  As directed      Allergies as of 02/10/2016   No Known Allergies     Medication List    STOP taking these medications   guaiFENesin 600 MG 12 hr tablet Commonly known as:  MUCINEX   sodium-potassium bicarbonate Tbef dissolvable tablet Commonly known as:  ALKA-SELTZER GOLD     TAKE these medications   albuterol 108 (90 Base) MCG/ACT inhaler Commonly known as:  PROVENTIL HFA;VENTOLIN HFA Inhale 2 puffs into the lungs every 6 (six) hours as needed for wheezing or shortness of breath.   aspirin 325 MG EC tablet Take 1 tablet (325 mg total) by mouth daily. Start taking on:  02/11/2016   carvedilol 6.25 MG tablet Commonly known as:  COREG Take 1 tablet (6.25 mg total) by mouth 2 (two) times daily with a meal.   diltiazem 180 MG 24 hr capsule Commonly known as:  CARDIZEM CD Take 1 capsule (180 mg total) by mouth daily. Start taking on:  02/11/2016   furosemide 40 MG tablet Commonly known as:  LASIX Take 1 tablet (40 mg total) by mouth daily.   hydrALAZINE 50 MG tablet Commonly known as:  APRESOLINE Take 1 tablet (50 mg total) by mouth every 8 (eight) hours.   isosorbide mononitrate 30 MG 24 hr tablet Commonly  known as:  IMDUR Take 1 tablet (30 mg total) by mouth daily. Start taking on:  02/11/2016   mometasone-formoterol 100-5 MCG/ACT Aero Commonly known as:  DULERA Inhale 2 puffs into the lungs 2 (two) times daily.   potassium chloride 10 MEQ tablet Commonly known as:  K-DUR Take 2 tablets (20 mEq total) by mouth daily.   tiotropium 18 MCG inhalation capsule Commonly known as:  SPIRIVA Place 1 capsule (18 mcg total) into inhaler and inhale daily. Start taking on:  02/11/2016      Follow-up Information    Philis Fendt, MD. Go on 02/21/2016.   Specialty:  Internal Medicine Why:  @2 :30PM Contact information: [address] [place] [address] [phone]        [name], MD Follow up.   Specialty:  Cardiology Why:  office will contact you Contact information: [place] [address] [phone]          No Known Allergies  Consultations:   cardiology  Other Procedures/Studies: Dg Chest 2 View  Result Date: 02/07/2016 CLINICAL DATA:  Shortness of breath and chest pain since yesterday, sudden episode of shortness of breath with chest tightness, history hypertension, smoking EXAM: CHEST  2 VIEW COMPARISON:  None FINDINGS: Normal heart size, mediastinal contours, and pulmonary vascularity. Mild central peribronchial thickening and slight hyperinflation question COPD. No pulmonary infiltrate, pleural effusion or pneumothorax. Osseous structures unremarkable. IMPRESSION: Peribronchial thickening and hyperinflation question COPD. No acute infiltrate. Electronically Signed   By: Lavonia Dana M.D.   On: 02/07/2016 13:40   Ct Head Wo Contrast  Result Date: 02/08/2016 CLINICAL DATA:  Hypertension and blurred vision EXAM: CT HEAD WITHOUT CONTRAST TECHNIQUE: Contiguous axial images were obtained from the base of the skull through the vertex without intravenous contrast. COMPARISON:  None. FINDINGS: Brain: No mass lesion, intraparenchymal hemorrhage or extra-axial collection. No evidence of acute cortical infarct. Brain parenchyma and CSF-containing spaces are normal for age. Vascular: No hyperdense vessel or unexpected calcification. Skull: Normal visualized skull base, calvarium and extracranial soft tissues. Sinuses/Orbits: Findings of prior functional endoscopic sinus surgery. Partial opacification of the ethmoid and maxillary sinuses with secretions and mucosal thickening. Normal orbits. IMPRESSION: 1. No acute intracranial abnormality. 2. Ethmoid and maxillary sinus disease. Electronically Signed   By: Ulyses Jarred M.D.   On: 02/08/2016 03:44     TODAY-DAY OF DISCHARGE:  Subjective:   Tawni Levy today has no  headache,no chest abdominal pain,no new weakness tingling or numbness, feels much better wants to go home today.   Objective:   Blood pressure (!) 150/88, pulse 75, temperature 98.2 F (36.8 C), resp. rate 18, height 6' (1.829 m), weight 73.7 kg (162 lb 8 oz), SpO2 98 %.  Intake/Output Summary (Last 24 hours) at 02/10/16 1444 Last data filed at 02/10/16 1439  Gross per 24 hour  Intake             1100 ml  Output             2825 ml  Net            -1725 ml   Filed Weights   02/09/16 0357 02/09/16 1427 02/10/16 0507  Weight: 73.2 kg (161 lb 4.8 oz) 74.1 kg (163 lb 4.8 oz) 73.7 kg (162 lb 8 oz)    Exam: Awake Alert, Oriented *3, No new F.N deficits, Normal affect .AT,PERRAL Supple Neck,No JVD, No cervical lymphadenopathy appriciated.  Symmetrical Chest wall movement, Good air movement  bilaterally, CTAB RRR,No Gallops,Rubs or new Murmurs, No Parasternal Heave +ve B.Sounds, Abd Soft, Non tender, No organomegaly appriciated, No rebound -guarding or rigidity. No Cyanosis, Clubbing or edema, No new Rash or bruise   PERTINENT RADIOLOGIC STUDIES: Dg Chest 2 View  Result Date: 02/07/2016 CLINICAL DATA:  Shortness of breath and chest pain since yesterday, sudden episode of shortness of breath with chest tightness, history hypertension, smoking EXAM: CHEST  2 VIEW COMPARISON:  None FINDINGS: Normal heart size, mediastinal contours, and pulmonary vascularity. Mild central peribronchial thickening and slight hyperinflation question COPD. No pulmonary infiltrate, pleural effusion or pneumothorax. Osseous structures unremarkable. IMPRESSION: Peribronchial thickening and hyperinflation question COPD. No acute infiltrate. Electronically Signed   By: Lavonia Dana M.D.   On: 02/07/2016 13:40   Ct Head Wo Contrast  Result Date: 02/08/2016 CLINICAL DATA:  Hypertension and blurred vision EXAM: CT HEAD WITHOUT CONTRAST TECHNIQUE: Contiguous axial images were obtained from the base of the skull through  the vertex without intravenous contrast. COMPARISON:  None. FINDINGS: Brain: No mass lesion, intraparenchymal hemorrhage or extra-axial collection. No evidence of acute cortical infarct. Brain parenchyma and CSF-containing spaces are normal for age. Vascular: No hyperdense vessel or unexpected calcification. Skull: Normal visualized skull base, calvarium and extracranial soft tissues. Sinuses/Orbits: Findings of prior functional endoscopic sinus surgery. Partial opacification of the ethmoid and maxillary sinuses with secretions and mucosal thickening. Normal orbits. IMPRESSION: 1. No acute intracranial abnormality. 2. Ethmoid and maxillary sinus disease. Electronically Signed   By: Ulyses Jarred M.D.   On: 02/08/2016 03:44     PERTINENT LAB RESULTS: CBC: No results for input(s): WBC, HGB, HCT, PLT in the last 72 hours. CMET CMP     Component Value Date/Time   NA 136 02/09/2016 0154   K 4.2 02/09/2016 0154   CL 105 02/09/2016 0154   CO2 24 02/09/2016 0154   GLUCOSE 130 (H) 02/09/2016 0154   BUN 25 (H) 02/09/2016 0154   CREATININE 1.86 (H) 02/09/2016 0154   CALCIUM 8.9 02/09/2016 0154   PROT 8.0 02/08/2016 0227   ALBUMIN 3.2 (L) 02/08/2016 0227   AST 20 02/08/2016 0227   ALT 16 (L) 02/08/2016 0227   ALKPHOS 42 02/08/2016 0227   BILITOT 0.5 02/08/2016 0227   GFRNONAA 38 (L) 02/09/2016 0154   GFRAA 44 (L) 02/09/2016 0154    GFR Estimated Creatinine Clearance: 45.1 mL/min (by C-G formula based on SCr of 1.86 mg/dL (H)). No results for input(s): LIPASE, AMYLASE in the last 72 hours.  Recent Labs  02/07/16 1526 02/07/16 2031 02/08/16 0227  TROPONINI <0.03 <0.03 <0.03   Invalid input(s): POCBNP No results for input(s): DDIMER in the last 72 hours. No results for input(s): HGBA1C in the last 72 hours. No results for input(s): CHOL, HDL, LDLCALC, TRIG, CHOLHDL, LDLDIRECT in the last 72 hours.  Recent Labs  02/07/16 1526  TSH 0.755   No results for input(s): VITAMINB12, FOLATE,  FERRITIN, TIBC, IRON, RETICCTPCT in the last 72 hours. Coags:  Recent Labs  02/07/16 1526  INR 1.12   Microbiology: Recent Results (from the past 240 hour(s))  MRSA PCR Screening     Status: None   Collection Time: 02/07/16  6:57 PM  Result Value Ref Range Status   MRSA by PCR NEGATIVE NEGATIVE Final    Comment:        The GeneXpert MRSA Assay (FDA approved for NASAL specimens only), is one component of a comprehensive MRSA colonization surveillance program. It is not intended to diagnose MRSA  infection nor to guide or monitor treatment for MRSA infections.     FURTHER DISCHARGE INSTRUCTIONS:  Get Medicines reviewed and adjusted: Please take all your medications with you for your next visit with your Primary MD  Laboratory/radiological data: Please request your Primary MD to go over all hospital tests and procedure/radiological results at the follow up, please ask your Primary MD to get all Hospital records sent to his/her office.  In some cases, they will be blood work, cultures and biopsy results pending at the time of your discharge. Please request that your primary care M.D. goes through all the records of your hospital data and follows up on these results.  Also Note the following: If you experience worsening of your admission symptoms, develop shortness of breath, life threatening emergency, suicidal or homicidal thoughts you must seek medical attention immediately by calling 911 or calling your MD immediately  if symptoms less severe.  You must read complete instructions/literature along with all the possible adverse reactions/side effects for all the Medicines you take and that have been prescribed to you. Take any new Medicines after you have completely understood and accpet all the possible adverse reactions/side effects.   Do not drive when taking Pain medications or sleeping medications (Benzodaizepines)  Do not take more than prescribed Pain, Sleep and Anxiety  Medications. It is not advisable to combine anxiety,sleep and pain medications without talking with your primary care practitioner  Special Instructions: If you have smoked or chewed Tobacco  in the last 2 yrs please stop smoking, stop any regular Alcohol  and or any Recreational drug use.  Wear Seat belts while driving.  Please note: You were cared for by a hospitalist during your hospital stay. Once you are discharged, your primary care physician will handle any further medical issues. Please note that NO REFILLS for any discharge medications will be authorized once you are discharged, as it is imperative that you return to your primary care physician (or establish a relationship with a primary care physician if you do not have one) for your post hospital discharge needs so that they can reassess your need for medications and monitor your lab values.  Total Time spent coordinating discharge including counseling, education and face to face time equals  45 minutes.  SignedOren Binet 02/10/2016 2:44 PM

## 2016-02-11 ENCOUNTER — Telehealth: Payer: Self-pay | Admitting: Internal Medicine

## 2016-02-11 NOTE — Telephone Encounter (Signed)
Patient contacted regarding discharge from [facility] on 02/10/16.    Patient understands to follow up with provider [name] PA on 02/21/15 at 9:00 AM at [facility] location.  Patient understands discharge instructions? yes  Patient understands medications and regiment? yes  Patient understands to bring all medications to this visit? yes

## 2016-02-11 NOTE — Telephone Encounter (Signed)
TCM Phone call .[facility] APpt is on 02/21/16 w/ Rhonda [name].. Thanks

## 2016-02-18 ENCOUNTER — Ambulatory Visit (HOSPITAL_COMMUNITY)
Admission: RE | Admit: 2016-02-18 | Discharge: 2016-02-18 | Disposition: A | Discharge status: Home / Self Care | Payer: Medicaid Other | Source: Ambulatory Visit | Attending: Family Medicine | Admitting: Family Medicine

## 2016-02-18 ENCOUNTER — Encounter: Payer: Self-pay | Admitting: Internal Medicine

## 2016-02-18 ENCOUNTER — Ambulatory Visit (INDEPENDENT_AMBULATORY_CARE_PROVIDER_SITE_OTHER): Payer: Medicaid Other | Admitting: Internal Medicine

## 2016-02-18 ENCOUNTER — Encounter (HOSPITAL_COMMUNITY): Payer: Self-pay

## 2016-02-18 VITALS — BP 164/86 | HR 76 | Temp 98.3°F | Ht 72.0 in | Wt 169.0 lb

## 2016-02-18 DIAGNOSIS — I10 Essential (primary) hypertension: Secondary | ICD-10-CM | POA: Insufficient documentation

## 2016-02-18 DIAGNOSIS — J439 Emphysema, unspecified: Secondary | ICD-10-CM | POA: Diagnosis not present

## 2016-02-18 DIAGNOSIS — L84 Corns and callosities: Secondary | ICD-10-CM

## 2016-02-18 DIAGNOSIS — M79604 Pain in right leg: Secondary | ICD-10-CM | POA: Diagnosis not present

## 2016-02-18 DIAGNOSIS — R400 Somnolence: Secondary | ICD-10-CM | POA: Diagnosis not present

## 2016-02-18 DIAGNOSIS — M79605 Pain in left leg: Secondary | ICD-10-CM | POA: Diagnosis not present

## 2016-02-18 DIAGNOSIS — M79606 Pain in leg, unspecified: Secondary | ICD-10-CM | POA: Insufficient documentation

## 2016-02-18 LAB — BASIC METABOLIC PANEL WITH GFR
BUN: 28 mg/dL — AB (ref 7–25)
CALCIUM: 8.8 mg/dL (ref 8.6–10.3)
CO2: 22 mmol/L (ref 20–31)
Chloride: 104 mmol/L (ref 98–110)
Creat: 1.59 mg/dL — ABNORMAL HIGH (ref 0.70–1.33)
GFR, EST AFRICAN AMERICAN: 55 mL/min — AB (ref >=60)
GFR, EST NON AFRICAN AMERICAN: 47 mL/min — AB (ref >=60)
GLUCOSE: 81 mg/dL (ref 65–99)
Potassium: 4.5 mmol/L (ref 3.5–5.3)
Sodium: 137 mmol/L (ref 135–146)

## 2016-02-18 LAB — CBC
HCT: 34.5 % — ABNORMAL LOW (ref 38.5–50.0)
HEMOGLOBIN: 11.4 g/dL — AB (ref 13.2–17.1)
MCH: 31.6 pg (ref 27.0–33.0)
MCHC: 33 g/dL (ref 32.0–36.0)
MCV: 95.6 fL (ref 80.0–100.0)
MPV: 9.1 fL (ref 7.5–12.5)
PLATELETS: 415 10*3/uL — AB (ref 140–400)
RBC: 3.61 MIL/uL — AB (ref 4.20–5.80)
RDW: 13.5 % (ref 11.0–15.0)
WBC: 9.3 10*3/uL (ref 3.8–10.8)

## 2016-02-18 NOTE — Patient Instructions (Addendum)
Please make a follow up appointment with Dr. Valentina Lucks, pharmacy, to schedule pulmonary function testing. Please schedule an appointment with me, Dr. Juleen China, in about a month but make sure its after Dr. Graylin Shiver appointment.   We will call you with the results of the lower extremity ultrasound looking for blood clots.   Please let me know if you have any problems getting the sleep study to test for sleep apnea.   I will call you with your lab results.   I have placed the referral to podiatry.   Nice to meet y'all!   Dr. Juleen China

## 2016-02-18 NOTE — Progress Notes (Signed)
Subjective:    Kevin [name] - 61 y.o. male MRN [id number]  Date of birth: 02/07/1956  HPI  Kevin [name] is here to establish care and for hospital follow up.  HTN: Patient was hospitalized for hypertensive urgency. Has a history of non-adherence and lack of follow up. Was without anti-hypertensive medications for about one year. Reports compliance with medications since discharge. Has been monitoring BPs at home and presents with a log today. BPs range from 150-190/80-110 at home.   COPD: No PFTs available in EMR and has never been seen by pulmonology. Takes Spiriva and Dulera daily. Has albuterol for prn use. Reports he has continued to feel SOB since leaving the hospital. Denies chest pain. Denies worsening of cough or new sputum production. Was treated with course of Prednisone during hospitalization.   Sleepiness: Has had prolonged history of sleepiness during the day and difficulty sleeping at night with frequent awakenings. Per wife in the room, he snores nightly and sometimes gasps for air. Does not take any medications for sleep at home.   Leg Pain: Reports pain in his calves bilaterally, but R > L. Denies LE edema. Pain has been occurring on and off for the past 3-4 days. Has also noticed some darkening of his veins in his legs.    -  reports that he has been smoking Cigarettes.  He has been smoking about 1.00 pack per day. He has never used smokeless tobacco. - Review of Systems: Per HPI. - Past Medical History: Patient Active Problem List   Diagnosis Date Noted  . Essential hypertension 02/18/2016  . Leg pain 02/18/2016  . Sleepiness 02/18/2016  . 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 Dyspnea 02/08/2016  . Shortness of breath 02/07/2016  . PAF (paroxysmal atrial fibrillation) ([place]) 02/07/2016  . Tobacco abuse 02/07/2016  . Acute on chronic renal insufficiency 02/07/2016  . COPD (chronic obstructive pulmonary disease) ([place]) 02/07/2016  . Hypertensive  emergency 02/07/2016  . Blurred vision, bilateral    - Medications: reviewed and updated   Objective:   Physical Exam BP (!) 164/86 (BP Location: Left Arm, Patient Position: Sitting, Cuff Size: Normal)   Pulse 76   Temp 98.3 F (36.8 C) (Oral)   Ht 6' (1.829 m)   Wt 169 lb (76.7 kg)   SpO2 96%   BMI 22.92 kg/m  Gen: NAD, alert, cooperative with exam, well-appearing CV: RRR, good S1/S2, no murmur Resp: CTABL, no wheezes, non-labored Ext: No LE edema. Pain to palpation of calves bilaterally R > L. Negative Homan's sign bilaterally.      Assessment & Plan:   COPD (chronic obstructive pulmonary disease) ([place]) Lungs clear and patient without any symptoms/signs on exam of active COPD exacerbation. Suspect his symptoms are his baseline worsened by cigarette smoking. Lower suspicion for PE given lack of productive cough, no hypoxia, no tachycardia, and no chest pain. However am evaluating for DVT and given recent hospitalization would consider further evaluating for PE if SOB significantly worsened. Am referring to Dr. Valentina Lucks for PFTs as patient does not have any documented in the system. Continue with current medication regimen for now.   Essential hypertension Patient hypertensive today to 164/86 but improved on repeat to 149/81. Has had elevated BPs at home but appeared to be mostly down-trending so may be related to body's adjustment to resuming anti-hypertensive regimen. Considered increasing Coreg as pulse appears it could tolerate but will defer to cardiology at outpatient visit scheduled next week. Patient to call if he  has systolic pressures >440 at home. Will check BMET today.   Leg pain Likely cramping due to an electrolyte abnormality, dehydration or deconditioning from recent hospitalization. However some concern for DVT and have scheduled patient for LE dopplers today.   Sleepiness Symptoms sound very concerning for OSA. Noted that this was a concern during hospitalization as  well and that cardiology is to refer patient for sleep study at his follow up. Discussed that I would not want to prescribe sedating sleep aids as that could be dangerous if he does indeed have OSA.     Phill Myron, D.O. 02/18/2016, 12:30 PM PGY-2, [place]

## 2016-02-18 NOTE — Assessment & Plan Note (Addendum)
Patient hypertensive today to 164/86 but improved on repeat to 149/81. Has had elevated BPs at home but appeared to be mostly down-trending so may be related to body's adjustment to resuming anti-hypertensive regimen. Considered increasing Coreg as pulse appears it could tolerate but will defer to cardiology at outpatient visit scheduled next week. Patient to call if he has systolic pressures >563 at home. Will check BMET today.

## 2016-02-18 NOTE — Assessment & Plan Note (Signed)
Symptoms sound very concerning for OSA. Noted that this was a concern during hospitalization as well and that cardiology is to refer patient for sleep study at his follow up. Discussed that I would not want to prescribe sedating sleep aids as that could be dangerous if he does indeed have OSA.

## 2016-02-18 NOTE — Assessment & Plan Note (Signed)
Lungs clear and patient without any symptoms/signs on exam of active COPD exacerbation. Suspect his symptoms are his baseline worsened by cigarette smoking. Lower suspicion for PE given lack of productive cough, no hypoxia, no tachycardia, and no chest pain. However am evaluating for DVT and given recent hospitalization would consider further evaluating for PE if SOB significantly worsened. Am referring to Dr. Valentina Lucks for PFTs as patient does not have any documented in the system. Continue with current medication regimen for now.

## 2016-02-18 NOTE — Assessment & Plan Note (Signed)
Likely cramping due to an electrolyte abnormality, dehydration or deconditioning from recent hospitalization. However some concern for DVT and have scheduled patient for LE dopplers today.

## 2016-02-18 NOTE — Progress Notes (Addendum)
VASCULAR LAB PRELIMINARY  PRELIMINARY  PRELIMINARY  PRELIMINARY  Bilateral lower extremity venous duplex completed.    Preliminary report:  Bilateral:  No evidence of DVT, superficial thrombosis, or Baker's Cyst.   Wagner Tanzi, RVS 02/18/2016, 4:03 PM

## 2016-02-21 ENCOUNTER — Ambulatory Visit (INDEPENDENT_AMBULATORY_CARE_PROVIDER_SITE_OTHER): Payer: Medicaid Other | Admitting: Physician Assistant

## 2016-02-21 ENCOUNTER — Encounter: Payer: Self-pay | Admitting: Physician Assistant

## 2016-02-21 VITALS — BP 164/80 | HR 65 | Ht 72.0 in | Wt 170.4 lb

## 2016-02-21 DIAGNOSIS — R0602 Shortness of breath: Secondary | ICD-10-CM

## 2016-02-21 DIAGNOSIS — I480 Paroxysmal atrial fibrillation: Secondary | ICD-10-CM | POA: Diagnosis not present

## 2016-02-21 DIAGNOSIS — I5033 Acute on chronic diastolic (congestive) heart failure: Secondary | ICD-10-CM | POA: Diagnosis not present

## 2016-02-21 MED ORDER — HYDRALAZINE HCL 50 MG PO TABS: ORAL_TABLET | ORAL | 5 refills | Status: DC

## 2016-02-21 NOTE — Progress Notes (Signed)
Cardiology Office Note   Date:  02/21/2016   ID:  Kevin [name], DOB 1955-05-04, MRN [id number]  PCP:  Melina Schools, DO  Cardiologist:  Dr Marchelle Folks, PA-C   Chief Complaint  Patient presents with  . Hospitalization Follow-up    sob,cp. denies lee     History of Present Illness: Kevin [name] is a 61 y.o. male with a history of HTN (prev on no meds), tob use  Admit 12/25-12/28 w/ HTN emergency, started on rx, atrial fib>>SR, CKD III, D-CHF, CHA2DS2VASc=1 and compliance an issue, anticoag w/ ASA. EF nl w/ grade 1 dd by echo, ?OSA  Kevin [name] presents for post-hospital followup. He is here today with is companion and his daughter  He is still having problems with SOB. He was having leg pain, [name] w/out DVT.   He had 1-2 episodes of diarrhea, has had more problems with constipation.   He has been very SOB with minimal activity. He wakes in the night gasping for air, his companion has noted him to quit breathing during sleep. Pt also says he has trouble getting air in.   No chest pain, occasional palpitations. He is getting headaches every day, new problem (on Imdur).   He has been compliant with his medications, but has been taking the Lasix and Kdur in the evening. 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  He is frustrated because he feels weak, cannot do what he was doing. He also feels too SOB to lift his 76 yo daughter. His family is making sure he is compliant with his meds and says they can make sure he does daily weights.   Past Medical History:  Diagnosis Date  . Chronic diastolic CHF (congestive heart failure), NYHA class 3 ([place]) 01/2016  . Hypertension   . PAF (paroxysmal atrial fibrillation) ([place]) 01/2016    Past Surgical History:  Procedure Laterality Date  . no past surgery      Current Outpatient Prescriptions  Medication Sig Dispense Refill  . albuterol (PROVENTIL HFA;VENTOLIN HFA) 108 (90 Base) MCG/ACT  inhaler Inhale 2 puffs into the lungs every 6 (six) hours as needed for wheezing or shortness of breath. 1 Inhaler 0  . aspirin EC 325 MG EC tablet Take 1 tablet (325 mg total) by mouth daily. 30 tablet 0  . carvedilol (COREG) 6.25 MG tablet Take 1 tablet (6.25 mg total) by mouth 2 (two) times daily with a meal. 60 tablet 0  . diltiazem (CARDIZEM CD) 180 MG 24 hr capsule Take 1 capsule (180 mg total) by mouth daily. 30 capsule 0  . furosemide (LASIX) 40 MG tablet Take 1 tablet (40 mg total) by mouth daily. 30 tablet 0  . hydrALAZINE (APRESOLINE) 50 MG tablet Take 1 tablet (50 mg total) by mouth every 8 (eight) hours. 90 tablet 0  . HYDROcodone-acetaminophen (NORCO) 10-325 MG tablet Take 1 tablet by mouth every 6 (six) hours as needed for pain.  0  . isosorbide mononitrate (IMDUR) 30 MG 24 hr tablet Take 1 tablet (30 mg total) by mouth daily. 30 tablet 0  . mometasone-formoterol (DULERA) 100-5 MCG/ACT AERO Inhale 2 puffs into the lungs 2 (two) times daily. 13 g 0  . potassium chloride (K-DUR) 10 MEQ tablet Take 2 tablets (20 mEq total) by mouth daily. 60 tablet 0  . tiotropium (SPIRIVA) 18 MCG inhalation capsule Place 1 capsule (18 mcg total) into inhaler and inhale daily. [place]  capsule 0   No current facility-administered medications for this visit.     Allergies:   Patient has no known allergies.    Social History:  The patient  reports that he has been smoking Cigarettes.  He has been smoking about 1.00 pack per day. He has never used smokeless tobacco. He reports that he drinks alcohol. He reports that he does not use drugs.   Family History:  The patient's family history includes High blood pressure in his mother; Lupus in his mother.    ROS:  Please see the history of present illness. All other systems are reviewed and negative.    PHYSICAL EXAM: VS:  BP (!) 164/80   Pulse 65   Ht 6' (1.829 m)   Wt 170 lb 6.4 oz (77.3 kg)   BMI 23.11 kg/m  , BMI Body mass index is 23.11 kg/m. GEN:  Well nourished, well developed, male in no acute distress  HEENT: normal for age  Neck: JVD 9 cm, +HJR, no carotid bruit, no masses Cardiac: RRR; soft murmur, no rubs, or gallops Respiratory: decreased BS bases bilaterally with a few rales, normal work of breathing GI: soft, nontender, nondistended, + BS MS: no deformity or atrophy; no edema; distal pulses are 2+ in all 4 extremities   Skin: warm and dry, no rash Neuro:  Strength and sensation are intact Psych: euthymic mood, full affect   EKG:  EKG is ordered today. The ekg ordered today demonstrates sr, LVH w/ abnl repol  ECHO: 02/09/2016 - Left ventricle: The cavity size was normal. There was moderate   concentric hypertrophy. Systolic function was vigorous. The   estimated ejection fraction was in the range of 65% to 70%. Wall   motion was normal; there were no regional wall motion   abnormalities. Doppler parameters are consistent with abnormal   left ventricular relaxation (grade 1 diastolic dysfunction).   Doppler parameters are consistent with elevated ventricular   end-diastolic filling pressure. - Aortic valve: Trileaflet; normal thickness leaflets. There was no   regurgitation. - Aortic root: The aortic root was normal in size. - Ascending aorta: The ascending aorta was normal in size. - Mitral valve: Structurally normal valve. There was no   regurgitation. - Left atrium: The atrium was normal in size. - Right ventricle: The cavity size was normal. Wall thickness was   normal. Systolic function was normal. - Right atrium: The atrium was normal in size. - Tricuspid valve: There was no regurgitation. - Pulmonary arteries: Systolic pressure could not be accurately   estimated. - Inferior vena cava: The vessel was dilated. The respirophasic   diameter changes were blunted (< 50%), consistent with elevated   central venous pressure. - Pericardium, extracardiac: There was no pericardial effusion.   Recent  Labs: 02/07/2016: B Natriuretic Peptide 208.6; TSH 0.755 02/08/2016: ALT 16 02/09/2016: Magnesium 1.9 02/18/2016: BUN 28; Creat 1.59; Hemoglobin 11.4; Platelets 415; Potassium 4.5; Sodium 137    Lipid Panel No results found for: CHOL, TRIG, HDL, CHOLHDL, VLDL, LDLCALC, LDLDIRECT   Wt Readings from Last 3 Encounters:  02/21/16 170 lb 6.4 oz (77.3 kg)  02/18/16 169 lb (76.7 kg)  02/10/16 162 lb 8 oz (73.7 kg)     Other studies Reviewed: Additional studies/ records that were reviewed today include: .  ASSESSMENT AND PLAN:  1.  Acute on chronic diastolic CHF: His weight is up 8 lbs since d/c. He is still having problems with SOB. He has no LE edema, but neck veins  are up and lung sounds decreased. Will ask him to take an extra Lasix tab tomorrow. He is to start tracking his weights. If his breathing does not improve, will need to continue the increased Lasix. Dry weight unclear.  Discussed low Na and fluid guidelines. Pt routinely drinks > 2 L/day, discussed limiting sodas and keeping total around 2 L  2. CKD III: BMET cked at PCP office 01/05. His renal function was at baseline. Recheck in 1 month if PCP has not done so.  3. HTN: Increase hydralazine to 75 mg tid. Can go up to 100 mg tid if not enough. HR high enough to increase Coreg, but with fatigue, will hold off for now.   4. HA: possibly from Imdur, rx with Tylenol and ok. Will decrease to 1/2 tab daily.    Current medicines are reviewed at length with the patient today.  The patient does not have concerns regarding medicines.  The following changes have been made:  Decrease Imdur, increase Hydralazine  Labs/ tests ordered today include:  No orders of the defined types were placed in this encounter.    Disposition:   FU with Dr Debara Pickett  Signed, Lenoard Aden  02/21/2016 9:47 AM    [place] Phone: [phone]; Fax: [phone]  This note was written with the assistance of speech  recognition software. Please excuse any transcriptional errors.

## 2016-02-21 NOTE — Patient Instructions (Addendum)
Medication Instructions:  TAKE YOUR FUROSEMIDE TWICE TOMORROW  INCREASE YOUR HYDRALAZINE TO 75 MG THREE TIMES A DAY   DECREASE YOUR ISOSORBIDE TO 30 MG 1/2 TABLET DAILY   Labwork: NONE   Testing/Procedures: Your physician has recommended that you have a sleep study. This test records several body functions during sleep, including: brain activity, eye movement, oxygen and carbon dioxide blood levels, heart rate and rhythm, breathing rate and rhythm, the flow of air through your mouth and nose, snoring, body muscle movements, and chest and belly movement.  Follow-Up: Your physician recommends that you schedule a follow-up appointment in: [facility] ON A DAY HILTY IN THE OFFICE  Any Other Special Instructions Will Be Listed Below (If Applicable).  INCREASE YOUR ACTIVITY AS TOLERATED WITHOUT OVERDOING IT  TRY OVER THE COUNTER STOOL SOFTNER (COLACE OR GENERIC)  DECREASE YOUR SODIUM (SALT) INTAKE     Heart-Healthy Eating Plan Introduction Heart-healthy meal planning includes:  Limiting unhealthy fats.  Increasing healthy fats.  Making other small dietary changes. You may need to talk with your doctor or a diet specialist (dietitian) to create an eating plan that is right for you. What types of fat should I choose?  Choose healthy fats. These include olive oil and canola oil, flaxseeds, walnuts, almonds, and seeds.  Eat more omega-3 fats. These include salmon, mackerel, sardines, tuna, flaxseed oil, and ground flaxseeds. Try to eat fish at least twice each week.  Limit saturated fats.  Saturated fats are often found in animal products, such as meats, butter, and cream.  Plant sources of saturated fats include palm oil, palm kernel oil, and coconut oil.  Avoid foods with partially hydrogenated oils in them. These include stick margarine, some tub margarines, cookies, crackers, and other baked goods. These contain trans fats. What general guidelines  do I need to follow?  Check food labels carefully. Identify foods with trans fats or high amounts of saturated fat.  Fill one half of your plate with vegetables and green salads. Eat 4-5 servings of vegetables per day. A serving of vegetables is:  1 cup of raw leafy vegetables.   cup of raw or cooked cut-up vegetables.   cup of vegetable juice.  Fill one fourth of your plate with whole grains. Look for the word "whole" as the first word in the ingredient list.  Fill one fourth of your plate with lean protein foods.  Eat 4-5 servings of fruit per day. A serving of fruit is:  One medium whole fruit.   cup of dried fruit.   cup of fresh, frozen, or canned fruit.   cup of 100% fruit juice.  Eat more foods that contain soluble fiber. These include apples, broccoli, carrots, beans, peas, and barley. Try to get 20-30 g of fiber per day.  Eat more home-cooked food. Eat less restaurant, buffet, and fast food.  Limit or avoid alcohol.  Limit foods high in starch and sugar.  Avoid fried foods.  Avoid frying your food. Try baking, boiling, grilling, or broiling it instead. You can also reduce fat by:  Removing the skin from poultry.  Removing all visible fats from meats.  Skimming the fat off of stews, soups, and gravies before serving them.  Steaming vegetables in water or broth.  Lose weight if you are overweight.  Eat 4-5 servings of nuts, legumes, and seeds per week:  One serving of dried beans or legumes equals  cup after being cooked.  One serving  of nuts equals 1 ounces.  One serving of seeds equals  ounce or one tablespoon.  You may need to keep track of how much salt or sodium you eat. This is especially true if you have high blood pressure. Talk with your doctor or dietitian to get more information. What foods can I eat? Grains  Breads, including [place], white, pita, wheat, raisin, rye, oatmeal, and [place]. Tortillas that are neither fried nor made  with lard or trans fat. Low-fat rolls, including hotdog and hamburger buns and English muffins. Biscuits. Muffins. Waffles. Pancakes. Light popcorn. Whole-grain cereals. Flatbread. Melba toast. Pretzels. Breadsticks. Rusks. Low-fat snacks. Low-fat crackers, including oyster, saltine, matzo, graham, animal, and rye. Rice and pasta, including brown rice and pastas that are made with whole wheat. Vegetables  All vegetables. Fruits  All fruits, but limit coconut. Meats and Other Protein Sources  Lean, well-trimmed beef, veal, pork, and lamb. Chicken and [place] without skin. All fish and shellfish. Wild duck, rabbit, pheasant, and venison. Egg whites or low-cholesterol egg substitutes. Dried beans, peas, lentils, and tofu. Seeds and most nuts. Dairy  Low-fat or nonfat cheeses, including ricotta, string, and mozzarella. Skim or 1% milk that is liquid, powdered, or evaporated. Buttermilk that is made with low-fat milk. Nonfat or low-fat yogurt. Beverages  Mineral water. Diet carbonated beverages. Sweets and Desserts  Sherbets and fruit ices. Honey, jam, marmalade, jelly, and syrups. Meringues and gelatins. Pure sugar candy, such as hard candy, jelly beans, gumdrops, mints, marshmallows, and small amounts of dark chocolate. [employer] Eat all sweets and desserts in moderation. Fats and Oils  Nonhydrogenated (trans-free) margarines. Vegetable oils, including soybean, sesame, sunflower, olive, peanut, safflower, corn, canola, and cottonseed. Salad dressings or mayonnaise made with a vegetable oil. Limit added fats and oils that you use for cooking, baking, salads, and as spreads. Other  Cocoa powder. Coffee and tea. All seasonings and condiments. The items listed above may not be a complete list of recommended foods or beverages. Contact your dietitian for more options.  What foods are not recommended? Grains  Breads that are made with saturated or trans fats, oils, or whole milk. Croissants. Butter  rolls. Cheese breads. Sweet rolls. Donuts. Buttered popcorn. Chow mein noodles. High-fat crackers, such as cheese or butter crackers. Meats and Other Protein Sources  Fatty meats, such as hotdogs, short ribs, sausage, spareribs, bacon, rib eye roast or steak, and mutton. High-fat deli meats, such as salami and bologna. Caviar. Domestic duck and goose. Organ meats, such as kidney, liver, sweetbreads, and heart. Dairy  Cream, sour cream, cream cheese, and creamed cottage cheese. Whole-milk cheeses, including blue (bleu), Monterey Jack, [place], [place], American, [place], Swiss, cheddar, [place], and [place]. Whole or 2% milk that is liquid, evaporated, or condensed. Whole buttermilk. Cream sauce or high-fat cheese sauce. Yogurt that is made from whole milk. Beverages  Regular sodas and juice drinks with added sugar. Sweets and Desserts  Frosting. Pudding. Cookies. Cakes other than angel food cake. Candy that has milk chocolate or white chocolate, hydrogenated fat, butter, coconut, or unknown ingredients. Buttered syrups. Full-fat ice cream or ice cream drinks. Fats and Oils  Gravy that has suet, meat fat, or shortening. Cocoa butter, hydrogenated oils, palm oil, coconut oil, palm kernel oil. These can often be found in baked products, candy, fried foods, nondairy creamers, and whipped toppings. Solid fats and shortenings, including bacon fat, salt pork, lard, and butter. Nondairy cream substitutes, such as coffee creamers and sour cream substitutes. Salad dressings that are  made of unknown oils, cheese, or sour cream. The items listed above may not be a complete list of foods and beverages to avoid. Contact your dietitian for more information.  This information is not intended to replace advice given to you by your health care provider. Make sure you discuss any questions you have with your health care provider. Document Released: 08/01/2011 Document Revised: 07/08/2015 Document Reviewed: 07/24/2013   2017 Elsevier

## 2016-02-22 ENCOUNTER — Other Ambulatory Visit: Payer: Self-pay | Admitting: Physician Assistant

## 2016-02-24 ENCOUNTER — Encounter: Payer: Self-pay | Admitting: Pharmacist

## 2016-02-24 ENCOUNTER — Ambulatory Visit (INDEPENDENT_AMBULATORY_CARE_PROVIDER_SITE_OTHER): Payer: Medicaid Other | Admitting: Pharmacist

## 2016-02-24 DIAGNOSIS — J439 Emphysema, unspecified: Secondary | ICD-10-CM

## 2016-02-24 DIAGNOSIS — Z720 Tobacco use: Secondary | ICD-10-CM

## 2016-02-24 DIAGNOSIS — R531 Weakness: Secondary | ICD-10-CM

## 2016-02-24 DIAGNOSIS — I10 Essential (primary) hypertension: Secondary | ICD-10-CM | POA: Diagnosis not present

## 2016-02-24 LAB — CBC
HEMATOCRIT: 33.1 % — AB (ref 38.5–50.0)
HEMOGLOBIN: 11.1 g/dL — AB (ref 13.2–17.1)
MCH: 31.4 pg (ref 27.0–33.0)
MCHC: 33.5 g/dL (ref 32.0–36.0)
MCV: 93.5 fL (ref 80.0–100.0)
MPV: 8.9 fL (ref 7.5–12.5)
Platelets: 341 10*3/uL (ref 140–400)
RBC: 3.54 MIL/uL — AB (ref 4.20–5.80)
RDW: 13.5 % (ref 11.0–15.0)
WBC: 10.2 10*3/uL (ref 3.8–10.8)

## 2016-02-24 MED ORDER — HYDRALAZINE HCL 50 MG PO TABS: ORAL_TABLET | ORAL | 5 refills | Status: DC

## 2016-02-24 NOTE — Progress Notes (Signed)
   S:    Patient arrives looking very tired/weak with domestic partner . Presents for lung function evaluation.   Patient was referred on 59/5/18.  Patient was last seen by Primary Care Provider on 02/18/16 by Dr Juleen China.   Patient reports being weaker and struggling just to walk in halls of his house related to breathing and tiredness.  Patient has been complaining of nausea, dizziness, and also getting air into his lungs.  Patient also reports migraines/headaches which he has been taking APAP.  He also reports chills today.   Patient reports last dose of COPD medications was last night (Dulera and Spiriva).    Currently smoking 3-5 cigarettes per day.  Patient is reporting he is trying to quit (previously 1 PPD [employer], (previously regular [place]).  Started smoking ~61 yo and has never really quit.  Domestic partner also smokes. He denies unfiltered cigarette use.  Partner reports that patch was effective while he was in the hospital.    Patient was recently discharged from hospital and all medications have been reviewed.  Today he states that his breathing is 3/10 compared to his breathing prior to the hospitalization.   Denies lower extremity edema.    Home BP 170-180/80-90s.  BP today 159/74 HR 78.  Partner reports BP much higher in morning than afternoon.   O: mMRC score= >2 (likely lower prior to December hospitalization) See Documentation Flowsheet - CAT/COPD for complete symptom scoring.  See "scanned report" or Documentation Flowsheet (discrete results - PFTs) for  Spirometry results. Patient provided good effort while attempting spirometry.   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 Will continue Dulera, Spiriva, and albuterol.  Patient is experiencing  weakness, shortness of breath and other symptoms that are likely not related to COPD.  Would consider repeat PFTs in the future as patient was not at his best breathing today. Reviewed results of pulmonary function tests.  Pt verbalized understanding of results and education.    Generalized Weakness: Patient and partner report weakness, nausea, chills, and shortness of breath since discharge.  Patient was previously active and working prior to admission but since discharge has been struggling to even move around his house. Patient was examined by Dr Andria Frames today in clinic.  Will order TSH, CMET, CBC, Sed Rate, CK and plan for followup tomorrow with Dr Juleen China.  Have decreased Hydralazine to 50 mg TID.    Tobacco Abuse/Smoking Cessation: 42 pack year smoker that has decreased cigarette from 1 PPD to 3-5 cigarettes per day.  Counseled on importance of quitting smoking.  May trial Nicotine patch in the future once source of generalized weakness has been confirmed.   Written pt instructions provided.  F/U Clinic visit tomorrow with Dr Juleen China. Total time in face to face counseling 60 minutes.  Patient seen with Bennye Alm, PharmD, BCPS.

## 2016-02-24 NOTE — Patient Instructions (Signed)
Decrease Hydralazine to 50 mg three times a day (1 tablet).  Followup with Dr Juleen China tomorrow.

## 2016-02-24 NOTE — Assessment & Plan Note (Signed)
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 Will continue Dulera, Spiriva, and albuterol.  Patient is experiencing weakness, shortness of breath and other symptoms that are likely not related to COPD.  Would consider repeat PFTs in the future as patient was not at his best breathing today. Reviewed results of pulmonary function tests.  Pt verbalized understanding of results and education.     Tobacco Abuse/Smoking Cessation: 42 pack year smoker that has decreased cigarette from 1 PPD to 3-5 cigarettes per day.  Counseled on importance of quitting smoking.  May trial Nicotine patch in the future once source of generalized weakness has been confirmed.

## 2016-02-24 NOTE — Assessment & Plan Note (Signed)
Generalized Weakness: Patient and partner report weakness, nausea, chills, and shortness of breath since discharge.  Patient was previously active and working prior to admission but since discharge has been struggling to even move around his house. Patient was examined by Dr Andria Frames today in clinic.  Will order TSH, CMET, CBC, Sed Rate, CK and plan for followup tomorrow with Dr Juleen China.  Have decreased Hydralazine to 50 mg TID.

## 2016-02-24 NOTE — Progress Notes (Signed)
Patient ID: Kevin [name], male   DOB: Jul 17, 1955, 61 y.o.   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  Brief exam shows considerable quad wasting and some muscle tenderness.  Appears quite down.  I think we should start work up with a fresh set of eyes approaching him more with a generalized weakness algorithm rather than COPD.  Has appointment for tomorrow.  In advance of that appointment I ordered: CMP (In hosp [place].  Check for new changes, esp hypokalemia.) CBC (recent CBC was OK.  Could he have dropped?) CK (could he have polymyositis or statin induced myositis) Sed rate (very generic but could confirm suspicion of serious illness that needs FU)  Notes: Just had normal TSH.  Will not repeat. Depression should remain in differential.

## 2016-02-24 NOTE — Assessment & Plan Note (Signed)
Tobacco Abuse/Smoking Cessation: 42 pack year smoker that has decreased cigarette from 1 PPD to 3-5 cigarettes per day.  Counseled on importance of quitting smoking.  May trial Nicotine patch in the future once source of generalized weakness has been confirmed.

## 2016-02-25 ENCOUNTER — Ambulatory Visit (INDEPENDENT_AMBULATORY_CARE_PROVIDER_SITE_OTHER): Payer: Medicaid Other | Admitting: Internal Medicine

## 2016-02-25 ENCOUNTER — Telehealth: Payer: Self-pay | Admitting: Internal Medicine

## 2016-02-25 ENCOUNTER — Encounter: Payer: Self-pay | Admitting: Internal Medicine

## 2016-02-25 DIAGNOSIS — R531 Weakness: Secondary | ICD-10-CM | POA: Diagnosis not present

## 2016-02-25 LAB — COMPLETE METABOLIC PANEL WITH GFR
ALBUMIN: 3.4 g/dL — AB (ref 3.6–5.1)
ALT: 32 U/L (ref 9–46)
AST: 29 U/L (ref 10–35)
Alkaline Phosphatase: 71 U/L (ref 40–115)
BUN: 26 mg/dL — ABNORMAL HIGH (ref 7–25)
CALCIUM: 9.1 mg/dL (ref 8.6–10.3)
CHLORIDE: 102 mmol/L (ref 98–110)
CO2: 25 mmol/L (ref 20–31)
Creat: 1.76 mg/dL — ABNORMAL HIGH (ref 0.70–1.33)
GFR, EST AFRICAN AMERICAN: 48 mL/min — AB (ref >=60)
GFR, EST NON AFRICAN AMERICAN: 42 mL/min — AB (ref >=60)
Glucose, Bld: 99 mg/dL (ref 65–99)
POTASSIUM: 4.6 mmol/L (ref 3.5–5.3)
Sodium: 134 mmol/L — ABNORMAL LOW (ref 135–146)
Total Bilirubin: 0.4 mg/dL (ref 0.2–1.2)
Total Protein: 8 g/dL (ref 6.1–8.1)

## 2016-02-25 LAB — CK: Total CK: 39 U/L (ref 7–232)

## 2016-02-25 LAB — SEDIMENTATION RATE: Sed Rate: 129 mm/hr — ABNORMAL HIGH (ref 0–20)

## 2016-02-25 NOTE — Assessment & Plan Note (Addendum)
Labs obtained yesterday (1/11) at pharmacy visit significant for ESR of 129. CK, CBC, and CMET unremarkable. Patient with recent normal TSH. Given temporal headaches and vision changes with elevated ESR concerned for Temporal Arteritis. Also concerned for Polymyalgia Rheumatica given weakness and muscle soreness. Discussed case with Dr. Manuella Ghazi, optho, who agreed to see patient in his clinic today as a work in. He asked that I not start steroids prior to his evaluation. Patient and family in agreement to go to [facility] immediately after this appointment. If Dr. Manuella Ghazi does not believe he has temporal arteritis, patient to call [facility] and will still prescribe Prednisone for probable PR. Some concern for malignancy remains as cause of patient's fatigue, weakness and abnormal ESR. However, patient does not have any red flags on history and weight has been essentially unchanged over past 6 months. Patient reports normal colonoscopy 1 year prior. No signs of infectious process at present on history or exam.

## 2016-02-25 NOTE — Progress Notes (Signed)
Patient has a 61 year old prescription for eye glasses that he is to wear all the time.   Did not have glasses with him.Kevin [name]

## 2016-02-25 NOTE — Progress Notes (Signed)
Subjective:    Kevin [name] - 61 y.o. male MRN [id number]  Date of birth: 1955-06-15  HPI  Kevin [name] is here for weakness/fatigue.  Weakness/Fatigue: Has been present for several months but worsened significantly over the past few days. 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 Muscles are sore diffusely to palpation but he does not have any muscular pain at rest. While he was hospitalized a couple weeks ago he was given Prednisone to treat a COPD exacerbation. While taking this steroid his fatigue and weakness improved. He is endorsing headaches and changes in vision over the past week. Does have corrective eyeglasses which he does not wear but reports vision has gotten more blurry this week. Headache is located over the temples and describes the pain as a throbbing sensation. Reports he had a colonoscopy one year ago at [facility] that was normal. Denies hemoptysis, bloody stools, and melena. Denies fever but endorses chills. Has had decreased appetite but drinking plenty of fluids.      -  reports that he has been smoking Cigarettes.  He has a 42.00 pack-year smoking history. He has never used smokeless tobacco. - Review of Systems: Per HPI. - Past Medical History: Patient Active Problem List   Diagnosis Date Noted  . Weakness 02/24/2016  . Essential hypertension 02/18/2016  . Leg pain 02/18/2016  . Sleepiness 02/18/2016  . 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 Dyspnea 02/08/2016  . Shortness of breath 02/07/2016  . PAF (paroxysmal atrial fibrillation) ([place]) 02/07/2016  . Tobacco abuse 02/07/2016  . Acute on chronic renal insufficiency 02/07/2016  . COPD (chronic obstructive pulmonary disease) ([place]) 02/07/2016  . Hypertensive emergency 02/07/2016  . Blurred vision, bilateral    - Medications: reviewed and updated   Objective:   Physical Exam BP (!)  145/90 (BP Location: Left Arm, Patient Position: Sitting, Cuff Size: Normal)   Pulse 87   Temp 98.2 F (36.8 C) (Oral)   Ht 6' (1.829 m)   Wt 170 lb 3.2 oz (77.2 kg)   SpO2 98%   BMI 23.08 kg/m  Gen: NAD, alert, cooperative with exam, appears tired  HEENT: NCAT, PERRL, clear conjunctiva, oropharynx clear, TTP over temples bilaterally L > R  CV: RRR, good S1/S2, no murmur, no edema Resp: CTABL, no wheezes, non-labored Abd: SNTND, BS present, no guarding or organomegaly MSK: TTP over shoulders and upper arms diffusely. TTP over hips and quads bilaterally. Intact ROM of shoulders bilaterally but painful.  Neuro: Gait normal but slowed. Grip strength 4/5 bilaterally but strength of upper and lower extremities preserved but painful. Alert and oriented. Normal speech.    Vision: 20/50 bilaterally (patient has corrective eyewear, did not have at appointment today)      Assessment & Plan:   Weakness Labs obtained yesterday (1/11) at pharmacy visit significant for ESR of 129. CK, CBC, and CMET unremarkable. Patient with recent normal TSH. Given temporal headaches and vision changes with elevated ESR concerned for Temporal Arteritis. Also concerned for Polymyalgia Rheumatica given weakness and muscle soreness. Discussed case with Dr. Manuella Ghazi, optho, who agreed to see patient in his clinic today as a work in. He asked that I not start steroids prior to his evaluation. Patient and family in agreement to go to [facility] immediately after this appointment. If Dr. Manuella Ghazi does not believe he has temporal arteritis, patient  to call [facility] and will still prescribe Prednisone for probable PR. Some concern for malignancy remains as cause of patient's fatigue, weakness and abnormal ESR. However, patient does not have any red flags on history and weight has been essentially unchanged over past 6 months. Patient reports normal colonoscopy 1 year prior. No signs of infectious process at present on history or  exam.     Phill Myron, D.O. 02/25/2016, 3:34 PM PGY-2, [place]

## 2016-02-25 NOTE — Telephone Encounter (Signed)
1. Type of surgery: temporal artery biopsy - right - under IV sedation @ [facility] 2. Date of surgery: Feb 28, 2016 3. Surgeon: Dr. Georjean Mode 4. Medications that need to be held & how long: none specified 5. Fax and/or Phone: ([phone]   [phone]  -- Nobie Putnam, RN   H/O HTN, AF, HF [place] Asters, [name] Feb 21, 2016

## 2016-02-25 NOTE — Patient Instructions (Addendum)
   Dr. Tama [facility] [place], [place] [address] Local: [phone] Fax: [phone]   If Dr. Manuella Ghazi rules out temporal arteritis and does not start steroids please let me know. I am suspicious that you might have polymyalgia rheumatica causing your weakness, fatigue, and muscle soreness. Therefore, I would still want to start steroids for that if Dr. Manuella Ghazi does not start them for you.   Please let me know if you need help regarding the sleep study.   Take Care,   Dr. Juleen China

## 2016-02-28 NOTE — Telephone Encounter (Signed)
Spoke with Freda Munro @ this practice and she has received the fax

## 2016-02-28 NOTE — Telephone Encounter (Signed)
Verbal order that patient is cleared for surgical procedure received per [name], PA-C 11:20am 02/28/16

## 2016-02-28 NOTE — Telephone Encounter (Signed)
F/u message  Sherlyn Hay call requesting to speak with RN to f/u on surgical clearance. Sherlyn Hay states pt procedure is today. Please call back to discuss

## 2016-02-28 NOTE — Telephone Encounter (Signed)
[facility] regarding clearance request received 02/25/16 for procedure today 02/28/16 and notified them patient is cleared per Suanne Marker, [name]  Routed note of clearance via EPIC fax.

## 2016-03-03 ENCOUNTER — Ambulatory Visit (HOSPITAL_BASED_OUTPATIENT_CLINIC_OR_DEPARTMENT_OTHER): Payer: Medicaid Other | Attending: Physician Assistant | Admitting: Cardiovascular Disease

## 2016-03-03 VITALS — Ht 72.0 in | Wt 160.0 lb

## 2016-03-03 DIAGNOSIS — I480 Paroxysmal atrial fibrillation: Secondary | ICD-10-CM | POA: Diagnosis not present

## 2016-03-03 DIAGNOSIS — R0602 Shortness of breath: Secondary | ICD-10-CM | POA: Diagnosis not present

## 2016-03-03 DIAGNOSIS — I493 Ventricular premature depolarization: Secondary | ICD-10-CM | POA: Diagnosis not present

## 2016-03-03 DIAGNOSIS — Z7982 Long term (current) use of aspirin: Secondary | ICD-10-CM | POA: Insufficient documentation

## 2016-03-03 DIAGNOSIS — G4731 Primary central sleep apnea: Secondary | ICD-10-CM

## 2016-03-03 DIAGNOSIS — G4733 Obstructive sleep apnea (adult) (pediatric): Secondary | ICD-10-CM | POA: Diagnosis not present

## 2016-03-03 DIAGNOSIS — G4737 Central sleep apnea in conditions classified elsewhere: Secondary | ICD-10-CM | POA: Insufficient documentation

## 2016-03-03 DIAGNOSIS — Z79899 Other long term (current) drug therapy: Secondary | ICD-10-CM | POA: Diagnosis not present

## 2016-03-04 ENCOUNTER — Other Ambulatory Visit: Payer: Self-pay

## 2016-03-04 ENCOUNTER — Emergency Department (HOSPITAL_COMMUNITY): Payer: Medicaid Other

## 2016-03-04 ENCOUNTER — Encounter (HOSPITAL_COMMUNITY): Payer: Self-pay | Admitting: Emergency Medicine

## 2016-03-04 ENCOUNTER — Emergency Department (HOSPITAL_COMMUNITY)
Admission: EM | Admit: 2016-03-04 | Discharge: 2016-03-04 | Disposition: A | Discharge status: Home / Self Care | Payer: Medicaid Other | Attending: Emergency Medicine | Admitting: Emergency Medicine

## 2016-03-04 DIAGNOSIS — J449 Chronic obstructive pulmonary disease, unspecified: Secondary | ICD-10-CM | POA: Insufficient documentation

## 2016-03-04 DIAGNOSIS — I110 Hypertensive heart disease with heart failure: Secondary | ICD-10-CM | POA: Insufficient documentation

## 2016-03-04 DIAGNOSIS — F17210 Nicotine dependence, cigarettes, uncomplicated: Secondary | ICD-10-CM | POA: Diagnosis not present

## 2016-03-04 DIAGNOSIS — Z7982 Long term (current) use of aspirin: Secondary | ICD-10-CM | POA: Insufficient documentation

## 2016-03-04 DIAGNOSIS — I5032 Chronic diastolic (congestive) heart failure: Secondary | ICD-10-CM | POA: Insufficient documentation

## 2016-03-04 DIAGNOSIS — R531 Weakness: Secondary | ICD-10-CM | POA: Insufficient documentation

## 2016-03-04 DIAGNOSIS — Z79899 Other long term (current) drug therapy: Secondary | ICD-10-CM | POA: Insufficient documentation

## 2016-03-04 LAB — URINALYSIS, ROUTINE W REFLEX MICROSCOPIC
Bilirubin Urine: NEGATIVE
Glucose, UA: NEGATIVE mg/dL
Hgb urine dipstick: NEGATIVE
KETONES UR: NEGATIVE mg/dL
LEUKOCYTES UA: NEGATIVE
NITRITE: NEGATIVE
PH: 5 (ref 5.0–8.0)
Protein, ur: NEGATIVE mg/dL
Specific Gravity, Urine: 1.013 (ref 1.005–1.030)

## 2016-03-04 LAB — CBC
HCT: 36 % — ABNORMAL LOW (ref 39.0–52.0)
HEMOGLOBIN: 12.3 g/dL — AB (ref 13.0–17.0)
MCH: 31 pg (ref 26.0–34.0)
MCHC: 34.2 g/dL (ref 30.0–36.0)
MCV: 90.7 fL (ref 78.0–100.0)
Platelets: 401 10*3/uL — ABNORMAL HIGH (ref 150–400)
RBC: 3.97 MIL/uL — AB (ref 4.22–5.81)
RDW: 13.9 % (ref 11.5–15.5)
WBC: 16.2 10*3/uL — ABNORMAL HIGH (ref 4.0–10.5)

## 2016-03-04 LAB — BASIC METABOLIC PANEL
ANION GAP: 9 (ref 5–15)
BUN: 39 mg/dL — ABNORMAL HIGH (ref 6–20)
CALCIUM: 8.8 mg/dL — AB (ref 8.9–10.3)
CHLORIDE: 102 mmol/L (ref 101–111)
CO2: 23 mmol/L (ref 22–32)
Creatinine, Ser: 1.69 mg/dL — ABNORMAL HIGH (ref 0.61–1.24)
GFR calc non Af Amer: 43 mL/min — ABNORMAL LOW (ref >60)
GFR, EST AFRICAN AMERICAN: 50 mL/min — AB (ref >60)
Glucose, Bld: 155 mg/dL — ABNORMAL HIGH (ref 65–99)
Potassium: 4.1 mmol/L (ref 3.5–5.1)
Sodium: 134 mmol/L — ABNORMAL LOW (ref 135–145)

## 2016-03-04 LAB — I-STAT TROPONIN, ED: TROPONIN I, POC: 0.04 ng/mL (ref 0.00–0.08)

## 2016-03-04 MED ORDER — DILTIAZEM HCL 25 MG/5ML IV SOLN
10.0000 mg | Freq: Once | INTRAVENOUS | Status: AC
  Administered 2016-03-04: 10 mg via INTRAVENOUS
  Filled 2016-03-04: qty 5

## 2016-03-04 MED ORDER — SODIUM CHLORIDE 0.9 % IV BOLUS (SEPSIS)
500.0000 mL | Freq: Once | INTRAVENOUS | Status: AC
  Administered 2016-03-04: 500 mL via INTRAVENOUS

## 2016-03-04 NOTE — Discharge Instructions (Signed)
Take prednisone Stay hydrated. If you were given medicines take as directed.  If you are on coumadin or contraceptives realize their levels and effectiveness is altered by many different medicines.  If you have any reaction (rash, tongues swelling, other) to the medicines stop taking and see a physician.    If your blood pressure was elevated in the ER make sure you follow up for management with a primary doctor or return for chest pain, shortness of breath or stroke symptoms.  Please follow up as directed and return to the ER or see a physician for new or worsening symptoms.  Thank you. Vitals:   03/04/16 1650 03/04/16 1815 03/04/16 1825 03/04/16 1830  BP: 130/80 133/91  144/90  Pulse: 74 80 93 108  Resp: 11 16 19 21   Temp:      TempSrc:      SpO2: 99% 99% 99% 99%  Weight:      Height:

## 2016-03-04 NOTE — ED Triage Notes (Signed)
Pt c/o weakness and shortness of breath onset last night. Pt also c/o pain to head and abdomen.

## 2016-03-06 ENCOUNTER — Ambulatory Visit (INDEPENDENT_AMBULATORY_CARE_PROVIDER_SITE_OTHER): Payer: Medicaid Other | Admitting: Podiatry

## 2016-03-06 ENCOUNTER — Telehealth: Payer: Self-pay | Admitting: *Deleted

## 2016-03-06 ENCOUNTER — Encounter: Payer: Self-pay | Admitting: Podiatry

## 2016-03-06 DIAGNOSIS — Q828 Other specified congenital malformations of skin: Secondary | ICD-10-CM

## 2016-03-06 DIAGNOSIS — R0989 Other specified symptoms and signs involving the circulatory and respiratory systems: Secondary | ICD-10-CM

## 2016-03-06 NOTE — Progress Notes (Signed)
   Subjective:    Patient ID: Kevin [name], male    DOB: July 20, 1955, 61 y.o.   MRN: [id number]  HPI   61 year old male presents the office today for concerns of painful calluses does left foot. He also states that his feet do burning get numb at times. He does that he gets cramping just When he walks at times but this is not consistent. He denies any swelling or redness. He said no recent treatment for this. He is no other complaints today.    Review of Systems  Musculoskeletal: Positive for back pain.       Joint pain  Skin: Positive for color change.  All other systems reviewed and are negative.      Objective:   Physical Exam General: AAO x3, NAD  Dermatological: Thick annular hyperkeratotic lesion present on the left foot. Upon debridement there is no underlying ulceration, drainage or any signs of infection. There are no other open lesions or pre-ulcerative lesion identified at this time.  Vascular: Dorsalis Pedis artery and Posterior Tibial artery pedal pulses decreased. There is no pain with calf compression, swelling, warmth, erythema.   Neruologic: Grossly intact via light touch bilateral. Vibratory intact via tuning fork bilateral. Protective threshold with Semmes Wienstein monofilament intact. .   Musculoskeletal: Tenderness the hyperkeratotic lesion. No other areas of tenderness of the #5 bilaterally. No gross boney pedal deformities bilateral. No pain, crepitus, or limitation noted with foot and ankle range of motion bilateral. Muscular strength 5/5 in all groups tested bilateral.      Assessment & Plan:  61 year old male left porokeratosis, decreased pedal pulses -Treatment options discussed including all alternatives, risks, and complications -Etiology of symptoms were discussed -Hyperkeratotic lesion sharply debrided without complications or bleeding. Offloading pads were dispensed. Return if symptoms recur or worsen. -I will order arterial studies given a  decreased pulses will cramping. Follow-up with me after these studies are complete.  Celesta Gentile, DPM

## 2016-03-06 NOTE — Telephone Encounter (Signed)
Emergency contact Ms. Teague would like to speak with MD regarding patient's upcoming follow up.  Patient recently had a biopsy with his ophthalmologist and she would like to discuss the results of these and if the follow up is needed with you sooner than 2 weeks. Denette Hass,CMA

## 2016-03-08 NOTE — Telephone Encounter (Signed)
Working on obtaining results from [employer]. Will call Ms. Teague when I have more information.   Phill Myron, D.O. 03/08/2016, 12:01 PM PGY-2, [place]

## 2016-03-09 NOTE — Telephone Encounter (Signed)
Spoke with nurse at [name]. She is unable to see the results of the biopsy, just record that it was completed. She will contact the doctor who performed the biopsy and request the result. I have given her the fax number for [facility] and she plans to send biopsy results as well as most recent office notes. I will contact the emergency contact once I have received more information.   Phill Myron, D.O. 03/09/2016, 10:34 AM PGY-2, [place]

## 2016-03-10 ENCOUNTER — Telehealth: Payer: Self-pay | Admitting: *Deleted

## 2016-03-10 DIAGNOSIS — R0989 Other specified symptoms and signs involving the circulatory and respiratory systems: Secondary | ICD-10-CM

## 2016-03-10 NOTE — Telephone Encounter (Addendum)
-----   Message from Trula Slade, DPM sent at 03/10/2016  7:48 AM EST ----- Can you please order arterial studies for decreased pulse and cramping. [facility] ABI WITH TBI 93922, SERVICE ORDER [id number], EFFECTIVE DATE 03/10/2016 END DATE 04/09/2016. Faxed orders and approval to [facility]. 04/13/2016-[facility] required clinicals for LE Doppler Arterial ordered by Dr. Jacqualyn Posey [phone]. Faxed clinicals and 04/06/2016 ABI withTBI and LOV, Service Order: [id number]/18/2016-[facility] faxed statement stating, "Based on the clinical information provided, out physician is recommending that 93925-Duplex scan of lower extremity arteries or arterial bypass grafts; complete bilateral study be completed as an alternative study to the scan originally requested [place]." Dr. Berton Lan the change to [phone]. I CALLED [facility], SPOKE WITH FELICIA AND ACCEPTED THE [phone]. FELICIA GAVE AUTHORIZATION#: [id number], EXPIRED 05/13/2016. Faxed PA and orders to [facility].

## 2016-03-12 ENCOUNTER — Other Ambulatory Visit: Payer: Self-pay | Admitting: Internal Medicine

## 2016-03-12 NOTE — ED Provider Notes (Signed)
[place] [facility] DEPT Provider Note   CSN: [id number] Arrival date & time: 03/04/16  1403     History   Chief Complaint Chief Complaint  Patient presents with  . Weakness  . Shortness of Breath  . Irregular Heart Beat    HPI Patient with mild diastolic CHF, a fib, recent workup for possible TA presents with general weakness and mild sob.  Mild intermittent temple HA since workup.  Pt had biopsy done.  No fevers or chills, no rash. Pt feels sxs began once stopped prednisone.  No cp.  NO blood clot hx, no pleuritic sxs, no leg swelling, no active CA.   HPI  Past Medical History:  Diagnosis Date  . Chronic diastolic CHF (congestive heart failure), NYHA class 3 ([place]) 01/2016  . Hypertension   . PAF (paroxysmal atrial fibrillation) ([place]) 01/2016    Patient Active Problem List   Diagnosis Date Noted  . Weakness 02/24/2016  . Essential hypertension 02/18/2016  . Leg pain 02/18/2016  . Sleepiness 02/18/2016  . 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 Dyspnea 02/08/2016  . Shortness of breath 02/07/2016  . PAF (paroxysmal atrial fibrillation) ([place]) 02/07/2016  . Tobacco abuse 02/07/2016  . Acute on chronic renal insufficiency 02/07/2016  . COPD (chronic obstructive pulmonary disease) ([place]) 02/07/2016  . Hypertensive emergency 02/07/2016  . Blurred vision, bilateral     Past Surgical History:  Procedure Laterality Date  . no past surgery         Home Medications    Prior to Admission medications   Medication Sig Start Date End Date Taking? Authorizing Provider  acetaminophen (TYLENOL) 500 MG tablet Take 1,000 mg by mouth every 6 (six) hours as needed for headache.   Yes Historical Provider, MD  albuterol (PROVENTIL HFA;VENTOLIN HFA) 108 (90 Base) MCG/ACT inhaler Inhale 2 puffs into the lungs every 6 (six) hours as needed for wheezing or shortness of breath. 02/10/16  Yes Shanker Kristeen Mans, MD  aspirin EC 325 MG EC tablet Take 1 tablet (325 mg total) by mouth  daily. Patient taking differently: Take 325 mg by mouth at bedtime.  02/11/16  Yes Shanker Kristeen Mans, MD  carvedilol (COREG) 6.25 MG tablet Take 1 tablet (6.25 mg total) by mouth 2 (two) times daily with a meal. 02/10/16  Yes Shanker Kristeen Mans, MD  diltiazem (CARDIZEM CD) 180 MG 24 hr capsule Take 1 capsule (180 mg total) by mouth daily. 02/11/16  Yes Shanker Kristeen Mans, MD  furosemide (LASIX) 40 MG tablet Take 1 tablet (40 mg total) by mouth daily. Patient taking differently: Take 40 mg by mouth daily after supper.  02/10/16  Yes Shanker Kristeen Mans, MD  hydrALAZINE (APRESOLINE) 50 MG tablet TAKE 1 tablet (50 MG) EVERY 8 HOURS Patient taking differently: Take 50 mg by mouth every 8 (eight) hours. TAKE 1 tablet (50 MG) EVERY 8 HOUR 02/24/16  Yes [name], MD  HYDROcodone-acetaminophen (NORCO) 10-325 MG tablet Take 1 tablet by mouth every 6 (six) hours as needed for pain. 01/31/16  Yes Historical Provider, MD  isosorbide mononitrate (IMDUR) 30 MG 24 hr tablet Take 15 mg by mouth daily with lunch.    Yes Historical Provider, MD  mometasone-formoterol (DULERA) 100-5 MCG/ACT AERO Inhale 2 puffs into the lungs 2 (two) times daily. 02/10/16  Yes Shanker Kristeen Mans, MD  potassium chloride (K-DUR) 10 MEQ tablet Take 2 tablets (20 mEq total) by mouth daily. 02/10/16  Yes Shanker Kristeen Mans, MD  predniSONE (DELTASONE) 20 MG tablet Take 60  mg by mouth daily. 7 day course filled 02/25/16, another 21 day course ordered 03/04/16 02/25/16  Yes Historical Provider, MD  tiotropium (SPIRIVA) 18 MCG inhalation capsule Place 1 capsule (18 mcg total) into inhaler and inhale daily. 02/11/16  Yes Shanker Kristeen Mans, MD    Family History Family History  Problem Relation Age of Onset  . High blood pressure Mother   . Lupus Mother     Social History Social History  Substance Use Topics  . Smoking status: Heavy Tobacco Smoker    Packs/day: 1.00    Years: 42.00    Types: Cigarettes  . Smokeless tobacco: Never Used       Comment: < 1 ppd  . Alcohol use Yes     Comment: socially     Allergies   Patient has no known allergies.   Review of Systems Review of Systems  Constitutional: Positive for fatigue. Negative for fever and unexpected weight change.  HENT: Negative for congestion.   Eyes: Negative for visual disturbance.  Respiratory: Positive for cough and shortness of breath.   Cardiovascular: Negative for chest pain and leg swelling.  Musculoskeletal: Negative for back pain and neck pain.  Allergic/Immunologic: Negative for immunocompromised state.  Neurological: Positive for headaches. Negative for weakness and light-headedness.     Physical Exam Updated Vital Signs BP 144/90   Pulse 108   Temp 98 F (36.7 C) (Oral)   Resp 21   Ht 6' (1.829 m)   Wt 160 lb (72.6 kg)   SpO2 99%   BMI 21.70 kg/m   Physical Exam  Constitutional: He is oriented to person, place, and time. He appears well-developed and well-nourished.  HENT:  Head: Normocephalic and atraumatic.  Eyes: Conjunctivae are normal. Right eye exhibits no discharge. Left eye exhibits no discharge.  Neck: Normal range of motion. Neck supple. No tracheal deviation present.  Cardiovascular: Normal rate and regular rhythm.   Pulmonary/Chest: Effort normal and breath sounds normal.  Abdominal: Soft. He exhibits no distension. There is no tenderness. There is no guarding.  Musculoskeletal: He exhibits no edema.  Neurological: He is alert and oriented to person, place, and time. He has normal strength. No cranial nerve deficit or sensory deficit.  No meningismus.  NO temple tenderness.  Neck supple.    Skin: Skin is warm. No rash noted.  Psychiatric: He has a normal mood and affect.  Nursing note and vitals reviewed.    ED Treatments / Results  Labs (all labs ordered are listed, but only abnormal results are displayed) Labs Reviewed  BASIC METABOLIC PANEL - Abnormal; Notable for the following:       Result Value    Sodium 134 (*)    Glucose, Bld 155 (*)    BUN 39 (*)    Creatinine, Ser 1.69 (*)    Calcium 8.8 (*)    GFR calc non Af Amer 43 (*)    GFR calc Af Amer 50 (*)    All other components within normal limits  CBC - Abnormal; Notable for the following:    WBC 16.2 (*)    RBC 3.97 (*)    Hemoglobin 12.3 (*)    HCT 36.0 (*)    Platelets 401 (*)    All other components within normal limits  URINALYSIS, ROUTINE W REFLEX MICROSCOPIC  I-STAT TROPOININ, ED    EKG  EKG Interpretation  Date/Time:  Saturday March 04 2016 14:05:52 EST Ventricular Rate:  117 PR Interval:    QRS Duration:  76 QT Interval:  314 QTC Calculation: 438 R Axis:   54 Text Interpretation:  Atrial fibrillation with rapid ventricular response Septal infarct , age undetermined ST & T wave abnormality, consider inferolateral ischemia Abnormal ECG Poor baseline Confirmed by Reather Converse MD, Noa Galvao [phone]) on 03/04/2016 4:19:08 PM       Radiology No results found. Dg Chest 2 View  Result Date: 03/04/2016 CLINICAL DATA:  Weakness and shortness of breath since last night. EXAM: CHEST  2 VIEW COMPARISON:  02/07/2016 FINDINGS: Heart size is normal. Mediastinal shadows are normal. The lungs are clear. No bronchial thickening. No infiltrate, mass, effusion or collapse. Pulmonary vascularity is normal. No bony abnormality. IMPRESSION: Normal chest Electronically Signed   By: Nelson Chimes M.D.   On: 03/04/2016 15:31    Procedures Procedures (including critical care time)  Medications Ordered in ED Medications  diltiazem (CARDIZEM) injection 10 mg (10 mg Intravenous Given 03/04/16 1645)  sodium chloride 0.9 % bolus 500 mL (0 mLs Intravenous Stopped 03/04/16 1926)     Initial Impression / Assessment and Plan / ED Course  I have reviewed the triage vital signs and the nursing notes.  Pertinent labs & imaging results that were available during my care of the patient were reviewed by me and considered in my medical decision making  (see chart for details).     Pt presents with fatigue/ gen weakness with recent discontinuation of prednisone.  Mild leukocytosis without signs of infeciton on exam.  Recent biopsy site no sign of infection.  Discussed restarting prednisone and close fup with pcp/ specialist this week.  Pt comfortable with plan.  A fib is not new.   Results and differential diagnosis were discussed with the patient/parent/guardian. Xrays were independently reviewed by myself.  Close follow up outpatient was discussed, comfortable with the plan.   Medications  diltiazem (CARDIZEM) injection 10 mg (10 mg Intravenous Given 03/04/16 1645)  sodium chloride 0.9 % bolus 500 mL (0 mLs Intravenous Stopped 03/04/16 1926)    Vitals:   03/04/16 1650 03/04/16 1815 03/04/16 1825 03/04/16 1830  BP: 130/80 133/91  144/90  Pulse: 74 80 93 108  Resp: 11 16 19 21   Temp:      TempSrc:      SpO2: 99% 99% 99% 99%  Weight:      Height:        Final diagnoses:  General weakness  A fib rvr   Final Clinical Impressions(s) / ED Diagnoses   Final diagnoses:  General weakness    New Prescriptions Discharge Medication List as of 03/04/2016  7:08 PM       Elnora Morrison, MD 03/12/16 [phone]

## 2016-03-13 ENCOUNTER — Encounter: Payer: Self-pay | Admitting: Internal Medicine

## 2016-03-14 ENCOUNTER — Telehealth: Payer: Self-pay | Admitting: Internal Medicine

## 2016-03-14 DIAGNOSIS — N183 Chronic kidney disease, stage 3 unspecified: Secondary | ICD-10-CM

## 2016-03-14 NOTE — Telephone Encounter (Signed)
Wanted to let PCP know pt was diagnosed with phase 3 kidney disease in the hospital. Pt would like to see a specialist. ep

## 2016-03-14 NOTE — Telephone Encounter (Signed)
Pt needs his inhaler refilled. ep

## 2016-03-15 ENCOUNTER — Other Ambulatory Visit: Payer: Self-pay | Admitting: *Deleted

## 2016-03-15 MED ORDER — POTASSIUM CHLORIDE ER 10 MEQ PO TBCR: 20.0000 meq | EXTENDED_RELEASE_TABLET | Freq: Every day | ORAL | 3 refills | Status: DC

## 2016-03-15 NOTE — Telephone Encounter (Signed)
I have placed the referral to nephrology for CKD.   Phill Myron, D.O. 03/15/2016, 8:46 AM PGY-2, [place]

## 2016-03-15 NOTE — Telephone Encounter (Signed)
Pt wife informed of below. Zimmerman Rumple, Javonni Macke D, CMA  

## 2016-03-21 ENCOUNTER — Ambulatory Visit (INDEPENDENT_AMBULATORY_CARE_PROVIDER_SITE_OTHER): Payer: Medicaid Other | Admitting: Internal Medicine

## 2016-03-21 ENCOUNTER — Telehealth: Payer: Self-pay | Admitting: Cardiovascular Disease

## 2016-03-21 ENCOUNTER — Encounter: Payer: Self-pay | Admitting: Internal Medicine

## 2016-03-21 VITALS — BP 124/62 | HR 62 | Temp 98.2°F | Ht 72.0 in | Wt 175.8 lb

## 2016-03-21 DIAGNOSIS — R531 Weakness: Secondary | ICD-10-CM | POA: Diagnosis not present

## 2016-03-21 DIAGNOSIS — M791 Myalgia, unspecified site: Secondary | ICD-10-CM

## 2016-03-21 LAB — BASIC METABOLIC PANEL WITH GFR
BUN: 19 mg/dL (ref 7–25)
CHLORIDE: 107 mmol/L (ref 98–110)
CO2: 28 mmol/L (ref 20–31)
CREATININE: 1.43 mg/dL — AB (ref 0.70–1.33)
Calcium: 8.7 mg/dL (ref 8.6–10.3)
GFR, EST AFRICAN AMERICAN: 62 mL/min (ref >=60)
GFR, Est Non African American: 54 mL/min — ABNORMAL LOW (ref >=60)
Glucose, Bld: 93 mg/dL (ref 65–99)
Potassium: 4.2 mmol/L (ref 3.5–5.3)
Sodium: 139 mmol/L (ref 135–146)

## 2016-03-21 MED ORDER — PREDNISONE 10 MG PO TABS: 10.0000 mg | ORAL_TABLET | Freq: Every day | ORAL | 0 refills | Status: DC

## 2016-03-21 MED ORDER — POTASSIUM CHLORIDE ER 10 MEQ PO TBCR: 20.0000 meq | EXTENDED_RELEASE_TABLET | Freq: Every day | ORAL | 3 refills | Status: DC

## 2016-03-21 NOTE — Telephone Encounter (Signed)
Follow up       When pt was released from hosp, they forgot to call in a presc for potassium.  Please call in a presc for potassium to [facility] [address].  If this is a problem, please call girlfriend.

## 2016-03-21 NOTE — Telephone Encounter (Signed)
New message ° ° ° ° ° ° °Calling to get sleep study results °

## 2016-03-21 NOTE — Telephone Encounter (Signed)
Refill sent to the pharmacy electronically. Aware sleep study results not available yet.

## 2016-03-21 NOTE — Patient Instructions (Signed)
I have placed the referral to rheumatology. You will be getting a phone call to schedule that appointment. I will call you with your lab results.   Start the Prednisone 10 mg daily for your weakness and muscle pain. Please let me know if you are still having severe symptoms on this dose.   Please follow up with me in one month.   Take Care,   Dr. Juleen China

## 2016-03-21 NOTE — Procedures (Signed)
Patient Name: Kevin [name], Kevin [name] Date: 03/03/2016 Gender: Male D.O.B: 1955-11-30 Age (years): 60 Referring Provider: Evelene Croon [name] PA-C Height (inches): 72 Interpreting Physician: Shelva Majestic MD, ABSM Weight (lbs): 160 RPSGT: Madelon Lips BMI: 22 MRN: [id number] Neck Size: 15.00  CLINICAL INFORMATION Sleep Study Type: NPSG  Indication for sleep study: AF, daytime sleepiness, snoring  Epworth Sleepiness Score: 12  SLEEP STUDY TECHNIQUE As per the AASM Manual for the Scoring of Sleep and Associated Events v2.3 (April 2016) with a hypopnea requiring 4% desaturations.  The channels recorded and monitored were frontal, central and occipital EEG, electrooculogram (EOG), submentalis EMG (chin), nasal and oral airflow, thoracic and abdominal wall motion, anterior tibialis EMG, snore microphone, electrocardiogram, and pulse oximetry.  MEDICATIONS acetaminophen (TYLENOL) 500 MG tablet aspirin EC 325 MG EC tablet CARTIA XT 180 MG 24 hr capsule carvedilol (COREG) 6.25 MG tablet DULERA 100-5 MCG/ACT AERO furosemide (LASIX) 40 MG tablet hydrALAZINE (APRESOLINE) 50 MG tablet HYDROcodone-acetaminophen (NORCO) 10-325 MG tablet isosorbide mononitrate (IMDUR) 30 MG 24 hr tablet potassium chloride (K-DUR) 10 MEQ tablet predniSONE (DELTASONE) 10 MG tablet PROAIR HFA 108 (90 Base) MCG/ACT inhaler SPIRIVA HANDIHALER 18 MCG inhalation capsule  Medications self-administered by patient taken the night of the study : N/A  SLEEP ARCHITECTURE The study was initiated at 10:17:27 PM and ended at 4:26:48 AM.  Sleep onset time was 105.3 minutes and the sleep efficiency was 32.4%. The total sleep time was 119.5 minutes. Wake after sleep onset (WASO) was 144.6 minutes.  Stage REM latency was 116.5 minutes.  The patient spent 27.62% of the night in stage N1 sleep, 55.65% in stage N2 sleep, 0.00% in stage N3 and 16.74% in REM.  Alpha intrusion was absent.  Supine sleep was  15.61%.  RESPIRATORY PARAMETERS The overall apnea/hypopnea index (AHI) was 27.6 per hour. There were 55 total apneas, including 5 obstructive, 49 central and 1 mixed apneas. There were 0 hypopneas and 22 RERAs.  The AHI during Stage REM sleep was 6.0 per hour.  AHI while supine was 74.0 per hour.  The mean oxygen saturation was 96.34%. The minimum SpO2 during sleep was 94.00%.  Moderate snoring was noted during this study.  CARDIAC DATA The 2 lead EKG demonstrated sinus rhythm. The mean heart rate was 82.31 beats per minute. Other EKG findings include: Atrial Fibrillation, PVCs.  LEG MOVEMENT DATA The total PLMS were 0 with a resulting PLMS index of 0.00. Associated arousal with leg movement index was 0.0 .  IMPRESSIONS - Moderate severe sleep apnea (AHI 27.6/h; RDI 38.7/h) with predominant cental events. Events were severe with supine position (AHI 74.0/h).  - Moderate central sleep apnea occurred during this study (CAI = 24.6/h). - The patient had minimal or no oxygen desaturation during the study (Min O2 = 94.00%). - Reduced sleep efficiency at only 32.4%. - The patient snored with Moderate snoring volume. - EKG findings include Atrial Fibrillation, PVCs. - Clinically significant periodic limb movements did not occur during sleep. No significant associated arousals.  DIAGNOSIS - Obstructive Sleep Apnea (327.23 [G47.33 ICD-10]) - Central Sleep Apnea (327.27 [G47.37 ICD-10])  RECOMMENDATIONS - Recommend an initial CPAP titration but patient may require BiPAP or ASV titration may be required to eliminate central sleep apnea. - Efforts should be made to optimize nasal and oropharyngeal patency. - The patient should be advised to avoid supine sleep; positional therapy may be beneficial. - Avoid alcohol, sedatives and other CNS depressants that may worsen sleep apnea and disrupt normal sleep architecture. -  Sleep hygiene should be reviewed to assess factors that may improve sleep  quality. -    [Electronically signed] 03/21/2016 07:24 PM  Shelva Majestic MD, [facility], [place], American Board of Sleep Medicine   NPI: [id number] [place] PH: [phone]   FX: [phone] [place]

## 2016-03-22 ENCOUNTER — Encounter: Payer: Self-pay | Admitting: Internal Medicine

## 2016-03-22 ENCOUNTER — Other Ambulatory Visit: Payer: Self-pay | Admitting: Podiatry

## 2016-03-22 DIAGNOSIS — R0989 Other specified symptoms and signs involving the circulatory and respiratory systems: Secondary | ICD-10-CM

## 2016-03-22 LAB — ANA: ANA: NEGATIVE

## 2016-03-22 NOTE — Assessment & Plan Note (Addendum)
Given presenting symptoms, prior elevated ESR to 129, and worsening of symptoms after d/c of Prednisone, concern remains for PR. According to UTD, patients are more likely to have relapses if started on an initial high dose of Prednisone. Will resume Prednisone at 10 mg as patient reports being controlled on this dose. If still severely symptomatic would increase to 15 mg. Polymyositis may also be potential cause but prior CK was in normal range. Noted that patient also found to have OSA on recent sleep study, so some of fatigue may be related to untreated OSA. Arrangements for CPAP being made. Will check BMET and ANA today. Referral to rheumatology placed. Patient to f/u in 1 month. Plan made with the assistance of Dr. Ree Kida.

## 2016-03-22 NOTE — Progress Notes (Signed)
   Subjective:    Kevin [name] - 61 y.o. male MRN [id number]  Date of birth: 07/26/1955  HPI  Kevin [name] is here for follow up of weakness/fatigue.  Patient seen on 1/12 for weakness, muscle pain, fatigue, temporal headache, and vision changes. Concern at that time for Polymyalgia Rheumatica and GCA so patient was sent to opthalmology. Was started on Prednisone 60 mg and several days later had a temporal biopsy, which ended up being negative. Patient reports taking 60 mg of Prednisone for almost two weeks then 20 mg x1 week, followed by 10 mg x1 week. While taking the Prednisone he felt like he returned to his baseline activity level and had great improvement in fatigue, sensation of weakness, and muscle pain. He has since finished the Prednisone about 4 days ago and prior symptoms have returned.    -  reports that he has been smoking Cigarettes.  He has a 21.00 pack-year smoking history. He has never used smokeless tobacco. - Review of Systems: Per HPI. - Past Medical History: Patient Active Problem List   Diagnosis Date Noted  . Weakness 02/24/2016  . Essential hypertension 02/18/2016  . Leg pain 02/18/2016  . Sleepiness 02/18/2016  . 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 Dyspnea 02/08/2016  . Shortness of breath 02/07/2016  . PAF (paroxysmal atrial fibrillation) (HCC) 02/07/2016  . Tobacco abuse 02/07/2016  . Acute on chronic renal insufficiency 02/07/2016  . COPD (chronic obstructive pulmonary disease) (HCC) 02/07/2016  . Hypertensive emergency 02/07/2016  . Blurred vision, bilateral    - Medications: reviewed and updated   Objective:   Physical Exam BP 124/62   Pulse 62   Temp 98.2 F (36.8 C) (Oral)   Ht 6' (1.829 m)   Wt 175 lb 12.8 oz (79.7 kg)   SpO2 98%   BMI 23.84 kg/m  Gen: NAD, alert, cooperative with exam, appears tired  CV: RRR, good S1/S2, no murmur, no edema, capillary refill brisk  Resp: CTABL, no wheezes, non-labored MSK: Diffuse TTP  of large proximal muscles bilaterally in upper and lower extremities.  Neuro: Strength 5/5 in all extremities. Sensation intact. Walks slowly but not antalgic gait.     Assessment & Plan:   Weakness Given presenting symptoms, prior elevated ESR to 129, and worsening of symptoms after d/c of Prednisone, concern remains for PR. According to UTD, patients are more likely to have relapses if started on an initial high dose of Prednisone. Will resume Prednisone at 10 mg as patient reports being controlled on this dose. If still severely symptomatic would increase to 15 mg. Polymyositis may also be potential cause but prior CK was in normal range. Noted that patient also found to have OSA on recent sleep study, so some of fatigue may be related to untreated OSA. Arrangements for CPAP being made. Will check BMET and ANA today. Referral to rheumatology placed. Patient to f/u in 1 month. Plan made with the assistance of Dr. Fletke.      , D.O. 03/22/2016, 2:31 PM PGY-2,  Family Medicine 

## 2016-03-23 ENCOUNTER — Telehealth: Payer: Self-pay | Admitting: *Deleted

## 2016-03-23 ENCOUNTER — Other Ambulatory Visit: Payer: Self-pay | Admitting: *Deleted

## 2016-03-23 ENCOUNTER — Telehealth: Payer: Self-pay | Admitting: Internal Medicine

## 2016-03-23 DIAGNOSIS — I10 Essential (primary) hypertension: Secondary | ICD-10-CM

## 2016-03-23 DIAGNOSIS — G4733 Obstructive sleep apnea (adult) (pediatric): Secondary | ICD-10-CM

## 2016-03-23 DIAGNOSIS — I480 Paroxysmal atrial fibrillation: Secondary | ICD-10-CM

## 2016-03-23 NOTE — Telephone Encounter (Signed)
Called the sleep lab and talked to [facility] to see when the patients sleep study was scheduled. She said Kevin [name] at [place] scheduled him for a sleep study on march 6 for  Dr Claiborne Billings to read and then follow up with Dr Claiborne Billings

## 2016-03-23 NOTE — Telephone Encounter (Signed)
Patient notified of sleep study results and recommendations. Titration study ordered.

## 2016-03-23 NOTE — Telephone Encounter (Signed)
[name], CMA aware patient is calling in regards to sleep study and she will reach out to him.

## 2016-03-23 NOTE — Progress Notes (Signed)
Patient notified of results and recommendations.

## 2016-03-23 NOTE — Telephone Encounter (Signed)
-----   Message from Freada Bergeron, [place] sent at 02/24/2016  3:18 PM EST ----- Regarding: sleep study Needs new consult with Fransico Him for OSA and sleep study  Outpatient f/u post hospital [facility] for PAF and HTN   Kevin [name] has a sleep study scheduled for Feb 14th (really?). Will Dr Radford Pax read this? If so, please schedule appt with her.  Thanks   This is based on who reads the study. If Dr. Radford Pax reads it, she will send you results and to schedule appropriate follow-up. If Dr. Claiborne Billings reads it, it goes to him.   Thanks!

## 2016-03-23 NOTE — Telephone Encounter (Signed)
LVM for pt to call back to give him the below information, if he calls back please inform him of below. Katharina Caper, April D, [place]

## 2016-03-23 NOTE — Telephone Encounter (Signed)
New Message     Please call returning your call about sleep study

## 2016-03-23 NOTE — Telephone Encounter (Signed)
-----   Message from [name], DO sent at 03/23/2016 12:04 PM EST ----- Please call patient and let him know that lab looking for non-specific autoimmune disease was negative. His electrolytes were normal. Kidney function looks a bit better than it did on labs 2 weeks ago.   Phill Myron, D.O. 03/23/2016, 12:04 PM PGY-2, [place]

## 2016-03-24 ENCOUNTER — Ambulatory Visit (INDEPENDENT_AMBULATORY_CARE_PROVIDER_SITE_OTHER): Payer: Medicaid Other | Admitting: Internal Medicine

## 2016-03-24 ENCOUNTER — Encounter: Payer: Self-pay | Admitting: Internal Medicine

## 2016-03-24 VITALS — BP 160/90 | HR 68 | Ht 72.0 in | Wt 175.8 lb

## 2016-03-24 DIAGNOSIS — R0602 Shortness of breath: Secondary | ICD-10-CM

## 2016-03-24 DIAGNOSIS — I5033 Acute on chronic diastolic (congestive) heart failure: Secondary | ICD-10-CM

## 2016-03-24 DIAGNOSIS — I480 Paroxysmal atrial fibrillation: Secondary | ICD-10-CM | POA: Diagnosis not present

## 2016-03-24 DIAGNOSIS — I10 Essential (primary) hypertension: Secondary | ICD-10-CM

## 2016-03-24 DIAGNOSIS — G4733 Obstructive sleep apnea (adult) (pediatric): Secondary | ICD-10-CM

## 2016-03-24 MED ORDER — CARVEDILOL 6.25 MG PO TABS: 9.3750 mg | ORAL_TABLET | Freq: Two times a day (BID) | ORAL | 5 refills | Status: DC

## 2016-03-24 NOTE — Progress Notes (Signed)
OFFICE NOTE  Chief Complaint:  Follow-up hospitalization  Primary Care Physician: Melina Schools, DO  HPI:  Kevin [name] is a 61 y.o. male who I saw in the hospital over Christmas. He has history of hypertension and had not seen his primary care provider for some time because they've not been able to get an appointment. He reports 2 weeks of worsening dyspnea on exertion, headache and new visual changes. He was found to have hypertensive emergency with blood pressures of [phone] systolic. He's also noted have an elevated creatinine. He denies any chest pain. On arrival is noted to be in A. fib with rate control. He has significant LVH on EKG as well as T-wave inversions in the inferior leads. Initial troponin was 0.02. Chest x-ray shows peribronchial thickening and hyperinflation concerning for possible COPD. BNP was mildly elevated at 208. He was given Lasix and has diuresed a considerable amount since he was in the emergency department.   03/24/2016  Since discharge he saw [name], PA-C in follow-up. He said a number of other medical issues including visual changes and headaches for which she underwent a temporal artery biopsy that was negative. He also is on a steroid taper and noted that when his steroids were tapered off he started having muscle pains and weakness. He went back on the steroids and his symptoms improved. Blood pressure was still elevated today 160/90. He reports shortness of breath which hasn't improved despite using inhalers. He does have some COPD but is on [place]. Echo showed EF of 65-70%. He did have paroxysmal atrial fibrillation with a CHADSVASC score of 1. EKG today shows sinus rhythm with minimal voltage criteria for LVH. He is on aspirin for anticoagulation. He has not had an ischemia evaluation.  PMHx:  Past Medical History:  Diagnosis Date  . Chronic diastolic CHF (congestive heart failure), NYHA class 3 ([place]) 01/2016  .  Hypertension   . PAF (paroxysmal atrial fibrillation) ([place]) 01/2016    Past Surgical History:  Procedure Laterality Date  . no past surgery      FAMHx:  Family History  Problem Relation Age of Onset  . High blood pressure Mother   . Lupus Mother     SOCHx:   reports that he has been smoking Cigarettes.  He has a 21.00 pack-year smoking history. He has never used smokeless tobacco. He reports that he drinks alcohol. He reports that he does not use drugs.  ALLERGIES:  No Known Allergies  ROS: Pertinent items noted in HPI and remainder of comprehensive ROS otherwise negative.  HOME MEDS: Current Outpatient Prescriptions on File Prior to Visit  Medication Sig Dispense Refill  . acetaminophen (TYLENOL) 500 MG tablet Take 1,000 mg by mouth every 6 (six) hours as needed for headache.    Mar[place] Kitchen aspirin EC 325 MG EC tablet Take 1 tablet (325 mg total) by mouth daily. (Patient taking differently: Take 325 mg by mouth at bedtime. ) 30 tablet 0  . CARTIA XT 180 MG 24 hr capsule TAKE ONE CAPSULE BY MOUTH DAILY 30 capsule 11  . DULERA 100-5 MCG/ACT AERO INHALE 2 PUFFS INTO THE LUNGS TWICE DAILY 13 g 0  . furosemide (LASIX) 40 MG tablet TAKE 1 TABLET BY MOUTH DAILY 30 tablet 11  . hydrALAZINE (APRESOLINE) 50 MG tablet TAKE 1 tablet (50 MG) EVERY 8 HOURS (Patient taking differently: Take 50 mg by mouth every 8 (eight) hours. TAKE 1 tablet (50 MG) EVERY 8 HOUR) 135  tablet 5  . HYDROcodone-acetaminophen (NORCO) 10-325 MG tablet Take 1 tablet by mouth every 6 (six) hours as needed for pain.  0  . isosorbide mononitrate (IMDUR) 30 MG 24 hr tablet TAKE 1 TABLET BY MOUTH DAILY 30 tablet 11  . potassium chloride (K-DUR) 10 MEQ tablet Take 2 tablets (20 mEq total) by mouth daily. 180 tablet 3  . predniSONE (DELTASONE) 10 MG tablet Take 1 tablet (10 mg total) by mouth daily with breakfast. 30 tablet 0  . PROAIR HFA 108 (90 Base) MCG/ACT inhaler INHALE 2 PUFFS INTO THE LUNGS EVERY 6 HOURS AS NEEDED FOR  WHEEZING OR SHORTNESS OF BREATH 8.5 g 0  . SPIRIVA HANDIHALER 18 MCG inhalation capsule INHALE CONTENTS OF 1 CAPSULE ONCE DAILY USING HANDIHALER 30 capsule 0  . [DISCONTINUED] hydrochlorothiazide (HYDRODIURIL) 25 MG tablet Take 1 tablet (25 mg total) by mouth daily. (Patient not taking: Reported on 11/24/2014) 10 tablet 0   No current facility-administered medications on file prior to visit.     LABS/IMAGING: No results found for this or any previous visit (from the past 48 hour(s)). No results found.  WEIGHTS: Wt Readings from Last 3 Encounters:  03/24/16 175 lb 12.8 oz (79.7 kg)  03/21/16 175 lb 12.8 oz (79.7 kg)  03/04/16 160 lb (72.6 kg)    VITALS: BP (!) 160/90   Pulse 68   Ht 6' (1.829 m)   Wt 175 lb 12.8 oz (79.7 kg)   BMI 23.84 kg/m   EXAM: General appearance: alert and no distress Neck: no carotid bruit and no JVD Lungs: clear to auscultation bilaterally Heart: regular rate and rhythm Abdomen: soft, non-tender; bowel sounds normal; no masses,  no organomegaly Extremities: extremities normal, atraumatic, no cyanosis or edema Pulses: 2+ and symmetric Skin: Skin color, texture, turgor normal. No rashes or lesions Neurologic: Grossly normal Psych: Pleasant  EKG: Normal sinus rhythm at 68, minimal voltage criteria for LVH, ST and T-wave abnormalities inferiorly concerning for possible ischemia or repolarization abnormality.  ASSESSMENT: 1. Persistent dyspnea on exertion 2. Resistant hypertension 3. Probable COPD 4. PAF-CHADSVASC score 1 5. Chronic neuropathic pain and weakness 6. Newly diagnosed OSA - set for CPAP titration in March  PLAN: 1.   Mr. Barnhard continues to have persistent dyspnea on exertion but no chest pain. His EKG does demonstrate ST and T-wave changes which could suggest ischemia however could be related to voltage criteria for LVH. Given a 20 pack year smoking history, his COPD, uncontrolled hypertension and other coronary risk factors as well  as PAF, an ischemia evaluation is warranted. I recommend a Lexiscan Myoview. But pressure still not at goal and will increase his carvedilol to 1-1/2 tablets (9.375 mg twice a day). Plan to see him back in one month for follow-up. He did have a recent sleep study which was abnormal indicating obstructive sleep apnea. CPAP titration is scheduled for March. His leg and muscle weakness is very interesting in that it responds to steroids however when he was weaned down his symptoms got worse to the point where he was so weak and fatigued he could not get out of bed. He said that when he went up on his steroids his symptoms improved. This could suggest a number of neurologic causes including CIDP or possibly PMR. He does see a neurologist in [place] who gives him injections in his back. We discussed possibly having him get a second opinion which she will consider.  [name], MD, [facility] Attending Cardiologist [place]  Nadean Corwin Liah Morr 03/24/2016, 10:41 AM

## 2016-03-24 NOTE — Patient Instructions (Addendum)
Your physician has requested that you have a lexiscan myoview. For further information please visit [email or web address]. Please follow instruction sheet, as given.  Your physician has recommended you make the following change in your medication:  -- INCREASE carvedilol to 1 & 1/2 tablets (9.375mg ) twice daily  Your physician recommends that you schedule a follow-up appointment in [place]

## 2016-03-28 ENCOUNTER — Other Ambulatory Visit: Payer: Self-pay

## 2016-03-29 ENCOUNTER — Encounter (HOSPITAL_BASED_OUTPATIENT_CLINIC_OR_DEPARTMENT_OTHER): Payer: Self-pay

## 2016-03-29 ENCOUNTER — Telehealth (HOSPITAL_COMMUNITY): Payer: Self-pay

## 2016-03-29 NOTE — Telephone Encounter (Signed)
Close encounter 

## 2016-03-31 ENCOUNTER — Ambulatory Visit (HOSPITAL_COMMUNITY)
Admission: RE | Admit: 2016-03-31 | Discharge: 2016-03-31 | Disposition: A | Discharge status: Home / Self Care | Payer: Medicaid Other | Source: Ambulatory Visit | Attending: Cardiology | Admitting: Cardiology

## 2016-03-31 DIAGNOSIS — I10 Essential (primary) hypertension: Secondary | ICD-10-CM

## 2016-03-31 DIAGNOSIS — R0602 Shortness of breath: Secondary | ICD-10-CM | POA: Diagnosis not present

## 2016-03-31 DIAGNOSIS — Z720 Tobacco use: Secondary | ICD-10-CM | POA: Diagnosis not present

## 2016-03-31 DIAGNOSIS — R0609 Other forms of dyspnea: Secondary | ICD-10-CM | POA: Diagnosis not present

## 2016-03-31 DIAGNOSIS — R5383 Other fatigue: Secondary | ICD-10-CM | POA: Insufficient documentation

## 2016-03-31 DIAGNOSIS — I509 Heart failure, unspecified: Secondary | ICD-10-CM | POA: Diagnosis not present

## 2016-03-31 DIAGNOSIS — G4733 Obstructive sleep apnea (adult) (pediatric): Secondary | ICD-10-CM | POA: Insufficient documentation

## 2016-03-31 DIAGNOSIS — I110 Hypertensive heart disease with heart failure: Secondary | ICD-10-CM | POA: Insufficient documentation

## 2016-03-31 DIAGNOSIS — R9439 Abnormal result of other cardiovascular function study: Secondary | ICD-10-CM | POA: Diagnosis not present

## 2016-03-31 LAB — MYOCARDIAL PERFUSION IMAGING
CHL CUP NUCLEAR SDS: 1
CHL CUP RESTING HR STRESS: 56 {beats}/min
LVDIAVOL: 194 mL (ref 62–150)
LVSYSVOL: 104 mL
NUC STRESS TID: 1.18
Peak HR: 93 {beats}/min
SRS: 3
SSS: 4

## 2016-03-31 MED ORDER — TECHNETIUM TC 99M TETROFOSMIN IV KIT
10.4000 | PACK | Freq: Once | INTRAVENOUS | Status: AC | PRN
  Administered 2016-03-31: 10.4 via INTRAVENOUS
  Filled 2016-03-31: qty 11

## 2016-03-31 MED ORDER — REGADENOSON 0.4 MG/5ML IV SOLN
0.4000 mg | Freq: Once | INTRAVENOUS | Status: AC
Start: 2016-03-31 — End: 2016-03-31
  Administered 2016-03-31: 0.4 mg via INTRAVENOUS

## 2016-03-31 MED ORDER — TECHNETIUM TC 99M TETROFOSMIN IV KIT
31.6000 | PACK | Freq: Once | INTRAVENOUS | Status: AC | PRN
  Administered 2016-03-31: 31.6 via INTRAVENOUS
  Filled 2016-03-31: qty 32

## 2016-04-06 ENCOUNTER — Ambulatory Visit (HOSPITAL_COMMUNITY)
Admission: RE | Admit: 2016-04-06 | Discharge: 2016-04-06 | Disposition: A | Discharge status: Home / Self Care | Payer: Medicaid Other | Source: Ambulatory Visit | Attending: Cardiovascular Disease | Admitting: Cardiovascular Disease

## 2016-04-06 DIAGNOSIS — R0989 Other specified symptoms and signs involving the circulatory and respiratory systems: Secondary | ICD-10-CM | POA: Diagnosis present

## 2016-04-06 DIAGNOSIS — R938 Abnormal findings on diagnostic imaging of other specified body structures: Secondary | ICD-10-CM | POA: Insufficient documentation

## 2016-04-06 DIAGNOSIS — I739 Peripheral vascular disease, unspecified: Secondary | ICD-10-CM | POA: Diagnosis not present

## 2016-04-06 DIAGNOSIS — J449 Chronic obstructive pulmonary disease, unspecified: Secondary | ICD-10-CM | POA: Diagnosis not present

## 2016-04-06 DIAGNOSIS — Z720 Tobacco use: Secondary | ICD-10-CM | POA: Insufficient documentation

## 2016-04-09 ENCOUNTER — Other Ambulatory Visit: Payer: Self-pay | Admitting: Internal Medicine

## 2016-04-10 ENCOUNTER — Inpatient Hospital Stay (HOSPITAL_COMMUNITY): Admission: RE | Admit: 2016-04-10 | Payer: Medicaid Other | Source: Ambulatory Visit

## 2016-04-10 ENCOUNTER — Ambulatory Visit (HOSPITAL_BASED_OUTPATIENT_CLINIC_OR_DEPARTMENT_OTHER): Payer: Medicaid Other | Attending: Cardiovascular Disease | Admitting: Cardiovascular Disease

## 2016-04-10 ENCOUNTER — Other Ambulatory Visit: Payer: Self-pay | Admitting: Nephrology

## 2016-04-10 VITALS — Ht 72.0 in | Wt 180.0 lb

## 2016-04-10 DIAGNOSIS — I10 Essential (primary) hypertension: Secondary | ICD-10-CM | POA: Insufficient documentation

## 2016-04-10 DIAGNOSIS — Z79899 Other long term (current) drug therapy: Secondary | ICD-10-CM | POA: Diagnosis not present

## 2016-04-10 DIAGNOSIS — R0683 Snoring: Secondary | ICD-10-CM | POA: Diagnosis not present

## 2016-04-10 DIAGNOSIS — Z7982 Long term (current) use of aspirin: Secondary | ICD-10-CM | POA: Diagnosis not present

## 2016-04-10 DIAGNOSIS — G4733 Obstructive sleep apnea (adult) (pediatric): Secondary | ICD-10-CM | POA: Diagnosis not present

## 2016-04-10 DIAGNOSIS — E1022 Type 1 diabetes mellitus with diabetic chronic kidney disease: Secondary | ICD-10-CM

## 2016-04-10 DIAGNOSIS — I480 Paroxysmal atrial fibrillation: Secondary | ICD-10-CM | POA: Diagnosis not present

## 2016-04-10 DIAGNOSIS — G4731 Primary central sleep apnea: Secondary | ICD-10-CM | POA: Diagnosis not present

## 2016-04-10 DIAGNOSIS — I129 Hypertensive chronic kidney disease with stage 1 through stage 4 chronic kidney disease, or unspecified chronic kidney disease: Principal | ICD-10-CM

## 2016-04-10 DIAGNOSIS — N183 Chronic kidney disease, stage 3 (moderate): Principal | ICD-10-CM

## 2016-04-10 DIAGNOSIS — G4730 Sleep apnea, unspecified: Secondary | ICD-10-CM | POA: Diagnosis present

## 2016-04-12 ENCOUNTER — Telehealth: Payer: Self-pay | Admitting: *Deleted

## 2016-04-12 NOTE — Telephone Encounter (Signed)
"  He has [place] Medicaid.  He is scheduled for a lower arterial on March 8 a [place].  I was going to start the pre-cert for you.  We have the results of the abnormal ABI that he had done.  I tried to do the pre-cert for you but [employer] stated the program had been termed and a case can't be created on a termed program.  I don't know what that means.  Can you take a look at and see if you can figure out what's going on with it.  If you have any questions give me a call back."

## 2016-04-16 NOTE — Procedures (Signed)
Patient Name: Kevin [name], Kevin [name] Date: 04/10/2016 Gender: Male D.O.B: 1955/02/17 Age (years): 33 Referring Provider: Shelva Majestic MD, ABSM Height (inches): 70 Interpreting Physician: Shelva Majestic MD, ABSM Weight (lbs): 180 RPSGT: Baxter Flattery BMI: 26 MRN: [id number] Neck Size: 15.00  CLINICAL INFORMATION The patient is referred for a BiPAP titration to treat sleep apnea.  Date of NPSG:  03/03/2016:  AHI 27.6/h; RDI 38.7/h; AHI with supine sleep 74.0/h;  there was moderate central apnea at 24.6/h.  SLEEP STUDY TECHNIQUE As per the AASM Manual for the Scoring of Sleep and Associated Events v2.3 (April 2016) with a hypopnea requiring 4% desaturations.  The channels recorded and monitored were frontal, central and occipital EEG, electrooculogram (EOG), submentalis EMG (chin), nasal and oral airflow, thoracic and abdominal wall motion, anterior tibialis EMG, snore microphone, electrocardiogram, and pulse oximetry. Bilevel positive airway pressure (BPAP) was initiated at the beginning of the study and titrated to treat sleep-disordered breathing.  MEDICATIONS acetaminophen (TYLENOL) 500 MG tablet aspirin EC 325 MG EC tablet CARTIA XT 180 MG 24 hr capsule carvedilol (COREG) 6.25 MG tablet DULERA 100-5 MCG/ACT AERO furosemide (LASIX) 40 MG tablet hydrALAZINE (APRESOLINE) 50 MG tablet HYDROcodone-acetaminophen (NORCO) 10-325 MG tablet isosorbide mononitrate (IMDUR) 30 MG 24 hr tablet potassium chloride (K-DUR) 10 MEQ tablet predniSONE (DELTASONE) 10 MG tablet PROAIR HFA 108 (90 Base) MCG/ACT inhaler SPIRIVA HANDIHALER 18 MCG inhalation capsule  Medications self-administered by patient taken the night of the study : N/A  RESPIRATORY PARAMETERS Optimal IPAP Pressure (cm): 12 AHI at Optimal Pressure (/hr) 3.2 Optimal EPAP Pressure (cm): 8   Overall Minimal O2 (%): 92.00 Minimal O2 at Optimal Pressure (%): 95.0 SLEEP ARCHITECTURE Start Time: 11:07:44 PM Stop Time: 5:10:59  AM Total Time (min): 363.2 Total Sleep Time (min): 250.4 Sleep Latency (min): 2.8 Sleep Efficiency (%): 68.9 REM Latency (min): 237.0 WASO (min): 110.0 Stage N1 (%): 21.16 Stage N2 (%): 71.25 Stage N3 (%): 0.00 Stage R (%): 7.59 Supine (%): 58.19 Arousal Index (/hr): 40.7     CARDIAC DATA The 2 lead EKG demonstrated sinus rhythm. The mean heart rate was 59.55 beats per minute. Other EKG findings include: None.  LEG MOVEMENT DATA The total Periodic Limb Movements of Sleep (PLMS) were 0. The PLMS index was 0.00. A PLMS index of <15 is considered normal in adults.  IMPRESSIONS - CPAP titration was initiated at 5 cm water pressure and increased to 11 cm water pressure.  The patient had frequent central apneic events with CPAP.  He ultimately was switched to BiPAP 12/8 with significant improvement but due to time constraints was not able to be titrated.  AHI at 12/8 was 3.2 with an RDI of 9.5.  The patient had one central event at 12/8, which was significantly improved from CPAP therapy. - Moderate Central Sleep Apnea was noted during this titration (CAI = 19.2/h). - Significant oxygen desaturations were not observed during this titration (min O2 = 92.00%). - The patient snored with Moderate snoring volume. - No cardiac abnormalities were observed during this study. - Clinically significant periodic limb movements were not noted during this study. Arousals associated with PLMs were rare.  DIAGNOSIS - Obstructive Sleep Apnea (327.23 [G47.33 ICD-10]) - Central Sleep Apnea   RECOMMENDATIONS - Recommend an initial trial of BiPAP Auto with B-Flex or EPR therapy  with an EPAP min of 8, delta of 4 and IPAP max of 20 cm H2O with heated humidification.  A Large size Resmed Nasal Pillow Mask AirFit P10 mask was  used for this titration. - Effort should be made to optimize nasal and oral pharyngeal patency. - Avoid alcohol, sedatives and other CNS depressants that may worsen sleep apnea and disrupt normal  sleep architecture. - Sleep hygiene should be reviewed to assess factors that may improve sleep quality. - Weight management and regular exercise should be initiated or continued. - A download should be obtained in 30 days and sleep clinic evaluation after 4 weeks of therapy.  [Electronically signed] 04/16/2016 11:14 AM  Shelva Majestic MD, [facility], [place], American Board of Sleep Medicine   NPI: [id number]  [place] PH: [phone]   FX: [phone] [place]

## 2016-04-17 ENCOUNTER — Telehealth: Payer: Self-pay | Admitting: *Deleted

## 2016-04-17 NOTE — Telephone Encounter (Signed)
"  Calling to inform you that this request from Dr. Celesta Gentile is pending an alternative recommendation.  If you have any additional questions contact [employer].

## 2016-04-18 ENCOUNTER — Encounter (HOSPITAL_BASED_OUTPATIENT_CLINIC_OR_DEPARTMENT_OTHER): Payer: Medicaid Other

## 2016-04-18 ENCOUNTER — Other Ambulatory Visit: Payer: Self-pay | Admitting: Internal Medicine

## 2016-04-20 ENCOUNTER — Inpatient Hospital Stay (HOSPITAL_COMMUNITY): Admission: RE | Admit: 2016-04-20 | Payer: Medicaid Other | Source: Ambulatory Visit

## 2016-04-24 ENCOUNTER — Ambulatory Visit: Payer: Medicaid Other | Admitting: Podiatry

## 2016-04-26 ENCOUNTER — Other Ambulatory Visit: Payer: Medicaid Other

## 2016-05-01 ENCOUNTER — Ambulatory Visit (HOSPITAL_COMMUNITY)
Admission: RE | Admit: 2016-05-01 | Discharge: 2016-05-01 | Disposition: A | Discharge status: Home / Self Care | Payer: Medicaid Other | Source: Ambulatory Visit | Attending: Cardiovascular Disease | Admitting: Cardiovascular Disease

## 2016-05-01 ENCOUNTER — Ambulatory Visit (INDEPENDENT_AMBULATORY_CARE_PROVIDER_SITE_OTHER): Payer: Medicaid Other | Admitting: Internal Medicine

## 2016-05-01 ENCOUNTER — Encounter: Payer: Self-pay | Admitting: Internal Medicine

## 2016-05-01 VITALS — BP 130/94 | HR 77 | Ht 72.0 in | Wt 175.0 lb

## 2016-05-01 DIAGNOSIS — Z13220 Encounter for screening for lipoid disorders: Secondary | ICD-10-CM | POA: Diagnosis not present

## 2016-05-01 DIAGNOSIS — R0602 Shortness of breath: Secondary | ICD-10-CM

## 2016-05-01 DIAGNOSIS — I480 Paroxysmal atrial fibrillation: Secondary | ICD-10-CM

## 2016-05-01 DIAGNOSIS — G4733 Obstructive sleep apnea (adult) (pediatric): Secondary | ICD-10-CM | POA: Diagnosis not present

## 2016-05-01 DIAGNOSIS — I10 Essential (primary) hypertension: Secondary | ICD-10-CM | POA: Diagnosis not present

## 2016-05-01 DIAGNOSIS — J449 Chronic obstructive pulmonary disease, unspecified: Secondary | ICD-10-CM | POA: Insufficient documentation

## 2016-05-01 DIAGNOSIS — I739 Peripheral vascular disease, unspecified: Secondary | ICD-10-CM

## 2016-05-01 DIAGNOSIS — Z720 Tobacco use: Secondary | ICD-10-CM | POA: Diagnosis not present

## 2016-05-01 DIAGNOSIS — I70203 Unspecified atherosclerosis of native arteries of extremities, bilateral legs: Secondary | ICD-10-CM | POA: Diagnosis not present

## 2016-05-01 DIAGNOSIS — R0989 Other specified symptoms and signs involving the circulatory and respiratory systems: Secondary | ICD-10-CM

## 2016-05-01 NOTE — Progress Notes (Signed)
OFFICE NOTE  Chief Complaint:  Short of breath  Primary Care Physician: Melina Schools, DO  HPI:  Kevin [name] is a 61 y.o. male who I saw in the hospital over Christmas. He has history of hypertension and had not seen his primary care provider for some time because they've not been able to get an appointment. He reports 2 weeks of worsening dyspnea on exertion, headache and new visual changes. He was found to have hypertensive emergency with blood pressures of 242-353/614 systolic. He's also noted have an elevated creatinine. He denies any chest pain. On arrival is noted to be in A. fib with rate control. He has significant LVH on EKG as well as T-wave inversions in the inferior leads. Initial troponin was 0.02. Chest x-ray shows peribronchial thickening and hyperinflation concerning for possible COPD. BNP was mildly elevated at 208. He was given Lasix and has diuresed a considerable amount since he was in the emergency department.   03/24/2016  Since discharge he saw [name], PA-C in follow-up. He said a number of other medical issues including visual changes and headaches for which she underwent a temporal artery biopsy that was negative. He also is on a steroid taper and noted that when his steroids were tapered off he started having muscle pains and weakness. He went back on the steroids and his symptoms improved. Blood pressure was still elevated today 160/90. He reports shortness of breath which hasn't improved despite using inhalers. He does have some COPD but is on [place]. Echo showed EF of 65-70%. He did have paroxysmal atrial fibrillation with a CHADSVASC score of 1. EKG today shows sinus rhythm with minimal voltage criteria for LVH. He is on aspirin for anticoagulation. He has not had an ischemia evaluation.  05/01/2016  [name] returns today for follow-up. He reports that he has been a little more short of breath recently. He is on a number of inhalers  and last night was more congested. His blood pressure looks better today and a recheck was 130/94. He denies any recurrent atrial fibrillation. I received lab work from his nephrologist [name], which showed creatinine of 1.41. He is not on enalapril due to possible renal artery stenosis. He does have renal Dopplers which are pending. In addition he's had some leg weakness which was both at rest and with exertion. He recently saw a podiatrist who diagnosed him with plantar fasciitis, but did order peripheral artery Dopplers which will be performed today in the office. Given his long-standing smoking history, he could have either PAD or renal artery stenosis for sure. I also had ordered a stress test on him. This demonstrated a fixed inferior defect which was suggestive of possible scar with mildly decreased LVEF of 47%. He is not having any active chest pain. This could represent a prior infarct, but he does not have any recollection of that. It suggests he does have significant underlying coronary disease. At this point I would recommend medical therapy given his chronic kidney disease as he would be at increased risk for contrast nephropathy with heart catheterization. Finally, he did recently have a sleep study which was abnormal and it was suggested that he be fitted with BiPAP therapy. That was about 3 weeks ago and he is not yet received equipment. I spoke with [name] nurse [name] she was involved in contacting the patient to arrange for his equipment.  PMHx:  Past Medical History:  Diagnosis Date  . Chronic  diastolic CHF (congestive heart failure), NYHA class 3 ([place]) 01/2016  . Hypertension   . PAF (paroxysmal atrial fibrillation) ([place]) 01/2016    Past Surgical History:  Procedure Laterality Date  . no past surgery      FAMHx:  Family History  Problem Relation Age of Onset  . High blood pressure Mother   . Lupus Mother     SOCHx:   reports that he has been smoking Cigarettes.   He has a 21.00 pack-year smoking history. He has never used smokeless tobacco. He reports that he drinks alcohol. He reports that he does not use drugs.  ALLERGIES:  No Known Allergies  ROS: Pertinent items noted in HPI and remainder of comprehensive ROS otherwise negative.  HOME MEDS: Current Outpatient Prescriptions on File Prior to Visit  Medication Sig Dispense Refill  . acetaminophen (TYLENOL) 500 MG tablet Take 1,000 mg by mouth every 6 (six) hours as needed for headache.    Marland Kitchen aspirin EC 325 MG EC tablet Take 1 tablet (325 mg total) by mouth daily. (Patient taking differently: Take 325 mg by mouth at bedtime. ) 30 tablet 0  . CARTIA XT 180 MG 24 hr capsule TAKE ONE CAPSULE BY MOUTH DAILY 30 capsule 11  . carvedilol (COREG) 6.25 MG tablet Take 1.5 tablets (9.375 mg total) by mouth 2 (two) times daily. 90 tablet 5  . DULERA 100-5 MCG/ACT AERO INHALE 2 PUFFS INTO THE LUNGS TWICE DAILY 13 g 0  . furosemide (LASIX) 40 MG tablet TAKE 1 TABLET BY MOUTH DAILY 30 tablet 11  . hydrALAZINE (APRESOLINE) 50 MG tablet TAKE 1 tablet (50 MG) EVERY 8 HOURS (Patient taking differently: Take 50 mg by mouth every 8 (eight) hours. TAKE 1 tablet (50 MG) EVERY 8 HOUR) 135 tablet 5  . HYDROcodone-acetaminophen (NORCO) 10-325 MG tablet Take 1 tablet by mouth every 6 (six) hours as needed for pain.  0  . isosorbide mononitrate (IMDUR) 30 MG 24 hr tablet TAKE 1 TABLET BY MOUTH DAILY 30 tablet 11  . potassium chloride (K-DUR) 10 MEQ tablet Take 2 tablets (20 mEq total) by mouth daily. 180 tablet 3  . predniSONE (DELTASONE) 10 MG tablet TAKE 1 TABLET BY MOUTH EVERY DAY WITH BREAKFAST 30 tablet 0  . PROAIR HFA 108 (90 Base) MCG/ACT inhaler INHALE 2 PUFFS INTO THE LUNGS EVERY 6 HOURS AS NEEDED FOR WHEEZING OR SHORTNESS OF BREATH 8.5 g 0  . SPIRIVA HANDIHALER 18 MCG inhalation capsule INHALE CONTENTS OF 1 CAPSULE ONCE DAILY USING HANDIHALER 30 capsule 0  . [DISCONTINUED] hydrochlorothiazide (HYDRODIURIL) 25 MG tablet  Take 1 tablet (25 mg total) by mouth daily. (Patient not taking: Reported on 11/24/2014) 10 tablet 0   No current facility-administered medications on file prior to visit.     LABS/IMAGING: No results found for this or any previous visit (from the past 48 hour(s)). No results found.  WEIGHTS: Wt Readings from Last 3 Encounters:  05/01/16 175 lb (79.4 kg)  04/10/16 180 lb (81.6 kg)  03/31/16 175 lb (79.4 kg)    VITALS: BP (!) 130/94   Pulse 77   Ht 6' (1.829 m)   Wt 175 lb (79.4 kg)   BMI 23.73 kg/m   EXAM: General appearance: alert and no distress Neck: no carotid bruit and no JVD Lungs: clear to auscultation bilaterally Heart: regular rate and rhythm Abdomen: soft, non-tender; bowel sounds normal; no masses,  no organomegaly Extremities: extremities normal, atraumatic, no cyanosis or edema Pulses: 2+ and symmetric Skin:  Skin color, texture, turgor normal. No rashes or lesions Neurologic: Grossly normal Psych: Pleasant  EKG: Deferred  ASSESSMENT: 1. Persistent dyspnea on exertion - mildly decreased LVEF 47% on NST with fixed inferior defect. ?scar 2. Resistent hypertension - mildly improved, renal artery dopplers pending 3. Probable COPD 4. PAF-CHADSVASC score 1 5. Chronic neuropathic pain and weakness 6. Newly diagnosed OSA - set for BIPAP  PLAN: 1.   Mr. Staup was found to have a fixed inferior defect with a questionable scar an EF of 47%. Some of his shortness of breath may be attributable to mild heart failure or COPD. Creatinine is elevated and is followed by Dr. Lu Duffel. He has renal Dopplers pending. He also has lower extremity Dopplers pending which I'll review. Currently his blood pressure control is adequate. We'll continue his current medications. I highly advised wearing BiPAP once that equipment is arranged for him at home. Finally, I have not seen a recent lipid profile. He has a number of indications for cholesterol lowering therapy. Will check a  fasting lipid profile and consider aggressive treatment with statin. We'll plan to see him back in 3-6 months for follow-up.  [name], MD, [facility] Attending Cardiologist [place] [facility] Rafael Quesada 05/01/2016, 10:54 AM

## 2016-05-01 NOTE — Patient Instructions (Signed)
Medication Instructions: Your physician recommends that you continue on your current medications as directed. Please refer to the Current Medication list given to you today.  Labwork: Your physician recommends that you return for a FASTING lipid profile.   Follow-Up: Your physician wants you to follow-up in: 6 months with Dr. Debara Pickett. You will receive a reminder letter in the mail two months in advance. If you don't receive a letter, please call our office to schedule the follow-up appointment.  If you need a refill on your cardiac medications before your next appointment, please call your pharmacy.

## 2016-05-02 ENCOUNTER — Telehealth: Payer: Self-pay | Admitting: *Deleted

## 2016-05-02 DIAGNOSIS — R0989 Other specified symptoms and signs involving the circulatory and respiratory systems: Secondary | ICD-10-CM

## 2016-05-02 NOTE — Telephone Encounter (Addendum)
-----   Message from Trula Slade, DPM sent at 05/02/2016  5:47 PM EDT ----- Tivis Ringer- can you please order a consult for abnormal arterial studies? Thanks. Referral, clinical and demographics faxed to [facility].

## 2016-05-08 ENCOUNTER — Ambulatory Visit
Admission: RE | Admit: 2016-05-08 | Discharge: 2016-05-08 | Disposition: A | Discharge status: Home / Self Care | Payer: Medicaid Other | Source: Ambulatory Visit | Attending: Nephrology | Admitting: Nephrology

## 2016-05-08 ENCOUNTER — Telehealth: Payer: Self-pay | Admitting: Cardiovascular Disease

## 2016-05-08 DIAGNOSIS — E1022 Type 1 diabetes mellitus with diabetic chronic kidney disease: Secondary | ICD-10-CM

## 2016-05-08 DIAGNOSIS — I10 Essential (primary) hypertension: Secondary | ICD-10-CM

## 2016-05-08 DIAGNOSIS — I129 Hypertensive chronic kidney disease with stage 1 through stage 4 chronic kidney disease, or unspecified chronic kidney disease: Principal | ICD-10-CM

## 2016-05-08 DIAGNOSIS — N183 Chronic kidney disease, stage 3 (moderate): Principal | ICD-10-CM

## 2016-05-09 ENCOUNTER — Ambulatory Visit (HOSPITAL_COMMUNITY)
Admission: EM | Admit: 2016-05-09 | Discharge: 2016-05-09 | Discharge status: Against Medical Advice | Payer: Medicaid Other | Attending: Internal Medicine | Admitting: Internal Medicine

## 2016-05-09 ENCOUNTER — Encounter (HOSPITAL_COMMUNITY): Payer: Self-pay

## 2016-05-09 ENCOUNTER — Ambulatory Visit (INDEPENDENT_AMBULATORY_CARE_PROVIDER_SITE_OTHER): Payer: Medicaid Other | Admitting: Cardiovascular Disease

## 2016-05-09 ENCOUNTER — Encounter: Payer: Self-pay | Admitting: Cardiovascular Disease

## 2016-05-09 ENCOUNTER — Emergency Department (HOSPITAL_COMMUNITY)
Admission: EM | Admit: 2016-05-09 | Discharge: 2016-05-10 | Disposition: A | Discharge status: Home / Self Care | Payer: Medicaid Other | Attending: Emergency Medicine | Admitting: Emergency Medicine

## 2016-05-09 VITALS — BP 152/82 | HR 55 | Ht 72.0 in | Wt 175.0 lb

## 2016-05-09 DIAGNOSIS — H539 Unspecified visual disturbance: Secondary | ICD-10-CM

## 2016-05-09 DIAGNOSIS — I739 Peripheral vascular disease, unspecified: Secondary | ICD-10-CM | POA: Insufficient documentation

## 2016-05-09 DIAGNOSIS — Z7982 Long term (current) use of aspirin: Secondary | ICD-10-CM | POA: Insufficient documentation

## 2016-05-09 DIAGNOSIS — Z79899 Other long term (current) drug therapy: Secondary | ICD-10-CM | POA: Insufficient documentation

## 2016-05-09 DIAGNOSIS — I5032 Chronic diastolic (congestive) heart failure: Secondary | ICD-10-CM | POA: Insufficient documentation

## 2016-05-09 DIAGNOSIS — I110 Hypertensive heart disease with heart failure: Secondary | ICD-10-CM | POA: Diagnosis not present

## 2016-05-09 DIAGNOSIS — H538 Other visual disturbances: Secondary | ICD-10-CM | POA: Diagnosis not present

## 2016-05-09 DIAGNOSIS — F17210 Nicotine dependence, cigarettes, uncomplicated: Secondary | ICD-10-CM | POA: Diagnosis not present

## 2016-05-09 MED ORDER — FLUORESCEIN SODIUM 0.6 MG OP STRP
1.0000 | ORAL_STRIP | Freq: Once | OPHTHALMIC | Status: AC
  Administered 2016-05-10: 1 via OPHTHALMIC
  Filled 2016-05-09: qty 1

## 2016-05-09 MED ORDER — TETRACAINE HCL 0.5 % OP SOLN
2.0000 [drp] | Freq: Once | OPHTHALMIC | Status: AC
  Administered 2016-05-10: 2 [drp] via OPHTHALMIC
  Filled 2016-05-09: qty 2

## 2016-05-09 NOTE — Progress Notes (Signed)
05/09/2016 Kevin [name]   02-17-1955  [id number]  Primary Physician Melina Schools, DO Primary Cardiologist: Lorretta Harp MD Renae Gloss  HPI:  Kevin [name] is a 61 year old married African-American male father of 5 daughters, grandfather 13 grandchildren who works as a Training and development officer. He was referred by Dr. Debara Pickett for peripheral vascular evaluation. He does have a history of hypertension and tobacco abuse. Never had a heart attack or stroke. Does complain of bilateral lower extremity claudication which is symmetric. This have mild to moderate renal insufficiency with a creatinine that runs in the 1.4-1.7 range. He is not on any ACE, ARBs  but is on furosemide. He complains of bilateral SI limiting claudication. Doppler suggests high frequency signals in both SFAs.  Current Outpatient Prescriptions  Medication Sig Dispense Refill  . acetaminophen (TYLENOL) 500 MG tablet Take 1,000 mg by mouth every 6 (six) hours as needed for headache.    Marland Kitchen aspirin EC 325 MG EC tablet Take 1 tablet (325 mg total) by mouth daily. (Patient taking differently: Take 325 mg by mouth at bedtime. ) 30 tablet 0  . CARTIA XT 180 MG 24 hr capsule TAKE ONE CAPSULE BY MOUTH DAILY 30 capsule 11  . carvedilol (COREG) 6.25 MG tablet Take 1.5 tablets (9.375 mg total) by mouth 2 (two) times daily. 90 tablet 5  . DULERA 100-5 MCG/ACT AERO INHALE 2 PUFFS INTO THE LUNGS TWICE DAILY 13 g 0  . furosemide (LASIX) 40 MG tablet TAKE 1 TABLET BY MOUTH DAILY 30 tablet 11  . hydrALAZINE (APRESOLINE) 50 MG tablet TAKE 1 tablet (50 MG) EVERY 8 HOURS (Patient taking differently: Take 50 mg by mouth every 8 (eight) hours. TAKE 1 tablet (50 MG) EVERY 8 HOUR) 135 tablet 5  . HYDROcodone-acetaminophen (NORCO) 10-325 MG tablet Take 1 tablet by mouth every 6 (six) hours as needed for pain.  0  . isosorbide mononitrate (IMDUR) 30 MG 24 hr tablet TAKE 1 TABLET BY MOUTH DAILY 30 tablet 11  . potassium chloride (K-DUR) 10 MEQ tablet  Take 2 tablets (20 mEq total) by mouth daily. 180 tablet 3  . predniSONE (DELTASONE) 10 MG tablet TAKE 1 TABLET BY MOUTH EVERY DAY WITH BREAKFAST 30 tablet 0  . PROAIR HFA 108 (90 Base) MCG/ACT inhaler INHALE 2 PUFFS INTO THE LUNGS EVERY 6 HOURS AS NEEDED FOR WHEEZING OR SHORTNESS OF BREATH 8.5 g 0  . SPIRIVA HANDIHALER 18 MCG inhalation capsule INHALE CONTENTS OF 1 CAPSULE ONCE DAILY USING HANDIHALER 30 capsule 0   No current facility-administered medications for this visit.     No Known Allergies  Social History   Social History  . Marital status: Single    Spouse name: N/A  . Number of children: N/A  . Years of education: N/A   Occupational History  . Not on file.   Social History Main Topics  . Smoking status: Current Every Day Smoker    Packs/day: 0.50    Years: 42.00    Types: Cigarettes  . Smokeless tobacco: Never Used     Comment: < 1 ppd  . Alcohol use Yes     Comment: socially  . Drug use: No  . Sexual activity: Not on file   Other Topics Concern  . Not on file   Social History Narrative  . No narrative on file     Review of Systems: General: negative for chills, fever, night sweats or weight changes.  Cardiovascular: negative for chest pain,  dyspnea on exertion, edema, orthopnea, palpitations, paroxysmal nocturnal dyspnea or shortness of breath Dermatological: negative for rash Respiratory: negative for cough or wheezing Urologic: negative for hematuria Abdominal: negative for nausea, vomiting, diarrhea, bright red blood per rectum, melena, or hematemesis Neurologic: negative for visual changes, syncope, or dizziness All other systems reviewed and are otherwise negative except as noted above.    Blood pressure (!) 152/82, pulse (!) 55, height 6' (1.829 m), weight 175 lb (79.4 kg), SpO2 98 %.  General appearance: alert and no distress Neck: no adenopathy, no carotid bruit, no JVD, supple, symmetrical, trachea midline and thyroid not enlarged, symmetric,  no tenderness/mass/nodules Lungs: clear to auscultation bilaterally Heart: regular rate and rhythm, S1, S2 normal, no murmur, click, rub or gallop Extremities: extremities normal, atraumatic, no cyanosis or edema  EKG sinus bradycardia 55 with evidence of LVH with repolarization changes. I personally reviewed this EKG.  ASSESSMENT AND PLAN:   Peripheral arterial disease ([place]) History of claudication referred by Dr. Debara Pickett today for evaluation. He has risk factors including tobacco abuse and hypertension. He's had recent Doppler studies revealing a right ABI 0.9-11.71 with high frequency signals in both mid SFAs. He does have a creatinine that runs in the 1.4-1.5  range. I'm going to admit him on April 22 for hydration and arrange for him to undergo angiography with limited contrast the following day. We will access the right common femoral and image the left lower extremity.      Lorretta Harp MD FACP,FACC,FAHA, [facility] 05/09/2016 10:55 AM

## 2016-05-09 NOTE — Assessment & Plan Note (Signed)
History of claudication referred by Dr. Debara Pickett today for evaluation. He has risk factors including tobacco abuse and hypertension. He's had recent Doppler studies revealing a right ABI 0.9-11.71 with high frequency signals in both mid SFAs. He does have a creatinine that runs in the 1.4-1.5  range. I'm going to admit him on April 22 for hydration and arrange for him to undergo angiography with limited contrast the following day. We will access the right common femoral and image the left lower extremity.

## 2016-05-09 NOTE — ED Notes (Signed)
PT LWBS and sent to ED for eval. PT has decreasing eyesight in right eye

## 2016-05-09 NOTE — ED Provider Notes (Signed)
[facility] DEPT Provider Note   CSN: [id number] Arrival date & time: 05/09/16  1847  By signing my name below, I, [name], attest that this documentation has been prepared under the direction and in the presence of [name], MD. Electronically Signed: [name], Scribe. 05/09/16. 11:52 PM.  History   Chief Complaint Chief Complaint  Patient presents with  . Blurred Vision    HPI Comments: Kevin [name] is a 61 y.o. male who presents to the Emergency Department complaining of sudden-onset, intermittent right blurred vision that began 10 days ago. Patient was seen at the [facility] (Dr. [name]) yesterday, in which he had a stitch removed from the right temple and was told his vision would improve. Patient states the stitch was placed 3 months ago for a temporal artery biopsy. Patient reports associated radiation to the left eye (3 days ago), visual changes, photophobia, bilateral eye drainage, eye irritation, and eye pain. Patient does wear glasses, no contacts. Patient reports applying warm and cold compresses with minimal relief. Patient denies any eye injury, headache, facial pain, eye itching, eye redness, or any other symptoms.  Vision seems to waxe and wane  The history is provided by the patient. No language interpreter was used.    Past Medical History:  Diagnosis Date  . Chronic diastolic CHF (congestive heart failure), NYHA class 3 ([place]) 01/2016  . Hypertension   . PAF (paroxysmal atrial fibrillation) ([place]) 01/2016    Patient Active Problem List   Diagnosis Date Noted  . Peripheral arterial disease ([place]) 05/09/2016  . Lipid screening 05/01/2016  . Acute on chronic diastolic CHF (congestive heart failure), NYHA class 3 ([place]) 03/24/2016  . OSA (obstructive sleep apnea) 03/24/2016  . Weakness 02/24/2016  . Uncontrolled hypertension 02/18/2016  . Leg pain 02/18/2016  . Sleepiness 02/18/2016  . 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    . Dyspnea 02/08/2016  . Shortness of breath 02/07/2016  . PAF (paroxysmal atrial fibrillation) ([place]) 02/07/2016  . Tobacco abuse 02/07/2016  . Acute on chronic renal insufficiency 02/07/2016  . COPD (chronic obstructive pulmonary disease) ([place]) 02/07/2016  . Hypertensive emergency 02/07/2016  . Blurred vision, bilateral     Past Surgical History:  Procedure Laterality Date  . no past surgery         Home Medications    Prior to Admission medications   Medication Sig Start Date End Date Taking? Authorizing Provider  aspirin EC 325 MG EC tablet Take 1 tablet (325 mg total) by mouth daily. Patient taking differently: Take 325 mg by mouth at bedtime.  02/11/16  Yes Shanker Kristeen Mans, MD  CARTIA XT 180 MG 24 hr capsule TAKE ONE CAPSULE BY MOUTH DAILY 03/13/16  Yes [name], MD  carvedilol (COREG) 6.25 MG tablet Take 1.5 tablets (9.375 mg total) by mouth 2 (two) times daily. 03/24/16  Yes [name], MD  DULERA 100-5 MCG/ACT AERO INHALE 2 PUFFS INTO THE LUNGS TWICE DAILY 04/19/16  Yes [name], DO  furosemide (LASIX) 40 MG tablet TAKE 1 TABLET BY MOUTH DAILY 03/13/16  Yes [name], MD  hydrALAZINE (APRESOLINE) 50 MG tablet TAKE 1 tablet (50 MG) EVERY 8 HOURS Patient taking differently: Take 50 mg by mouth every 8 (eight) hours. TAKE 1 tablet (50 MG) EVERY 8 HOUR 02/24/16  Yes [name], MD  HYDROcodone-acetaminophen (NORCO) 10-325 MG tablet Take 1 tablet by mouth every 6 (six) hours as needed for moderate pain.  04/27/16  Yes Historical Provider,  MD  isosorbide mononitrate (IMDUR) 30 MG 24 hr tablet TAKE 1 TABLET BY MOUTH DAILY 03/13/16  Yes [name], MD  potassium chloride (K-DUR) 10 MEQ tablet Take 2 tablets (20 mEq total) by mouth daily. 03/21/16  Yes [name], MD  predniSONE (DELTASONE) 10 MG tablet TAKE 1 TABLET BY MOUTH EVERY DAY WITH BREAKFAST 04/11/16  Yes [name], DO  PROAIR HFA 108 [phone] Base) MCG/ACT inhaler INHALE 2  PUFFS INTO THE LUNGS EVERY 6 HOURS AS NEEDED FOR WHEEZING OR SHORTNESS OF BREATH 03/15/16  Yes [name], DO  SPIRIVA HANDIHALER 18 MCG inhalation capsule INHALE CONTENTS OF 1 CAPSULE ONCE DAILY USING HANDIHALER 04/19/16  Yes [name], DO    Family History Family History  Problem Relation Age of Onset  . High blood pressure Mother   . Lupus Mother     Social History Social History  Substance Use Topics  . Smoking status: Current Every Day Smoker    Packs/day: 0.50    Years: 42.00    Types: Cigarettes  . Smokeless tobacco: Never Used     Comment: < 1 ppd  . Alcohol use Yes     Comment: socially     Allergies   Patient has no known allergies.   Review of Systems Review of Systems  Eyes: Positive for photophobia, pain, discharge (clear) and visual disturbance. Negative for redness and itching.  Neurological: Negative for dizziness, light-headedness and headaches.  All other systems reviewed and are negative.    Physical Exam Updated Vital Signs BP (!) 125/96   Pulse 73   Temp 98.1 F (36.7 C) (Oral)   Resp 18   SpO2 97%   Physical Exam  Constitutional: He is oriented to person, place, and time. He appears well-developed and well-nourished. No distress.  HENT:  Head: Normocephalic and atraumatic.  Right Ear: External ear normal.  Left Ear: External ear normal.  Nose: Nose normal.  No temple tenderness/swelling  Eyes: EOM and lids are normal. Pupils are equal, round, and reactive to light. Lids are everted and swept, no foreign bodies found. Right eye exhibits no discharge. Left eye exhibits no discharge. Right conjunctiva is not injected. Left conjunctiva is not injected. Right eye exhibits normal extraocular motion. Left eye exhibits normal extraocular motion.  Slit lamp exam:      The right eye shows no corneal abrasion and no fluorescein uptake.       The left eye shows no corneal abrasion and no fluorescein uptake.  Mild  photophobia in the right eye only, no pain or symptoms when shone in left IOP 23-30 in Right eye IOP 21-23 in left eye  Neck: Neck supple.  Pulmonary/Chest: Effort normal.  Neurological: He is alert and oriented to person, place, and time.  Skin: Skin is warm and dry. He is not diaphoretic.  Nursing note and vitals reviewed.    ED Treatments / Results  DIAGNOSTIC STUDIES: Oxygen Saturation is 99% on RA, normal by my interpretation.    COORDINATION OF CARE: 11:50 PM Discussed treatment plan with pt at bedside and pt agreed to plan, which includes an eye exam and an opthalmology follow-up.   Labs (all labs ordered are listed, but only abnormal results are displayed) Labs Reviewed - No data to display  EKG  EKG Interpretation None       Radiology US Renal  Result Date: 05/08/2016 CLINICAL DATA:  Chronic kidney disease stage 3 EXAM: RENAL / URINARY TRACT ULTRASOUND COMPLETE  COMPARISON:  CT 07/15/2015 FINDINGS: Right Kidney: Length: 11.0 cm. Mildly increased echotexture. No hydronephrosis or mass. Left Kidney: Length: 10.9 cm. Mildly increased echotexture. No hydronephrosis or mass. Bladder: Appears normal for degree of bladder distention. IMPRESSION: Mildly increased echotexture in the kidneys bilaterally suggesting chronic medical renal disease. No hydronephrosis. Electronically Signed   By: Rolm Baptise M.D.   On: 05/08/2016 09:39   US Renal Artery Stenosis  Result Date: 05/08/2016 CLINICAL DATA:  Diabetes, chronic kidney disease stage III, hypertension EXAM: RENAL DUPLEX DOPPLER ULTRASOUND TECHNIQUE: Duplex and color Doppler ultrasound was utilized to evaluate blood flow in the renal arteries and kidneys. COMPARISON:  CT 06/01/ 2017 FINDINGS: Right Renal Artery Velocities: Origin:  81 cm/sec Mid:  71 cm/sec Hilum:  43 cm/sec Interlobar:  37 cm/sec Arcuate: 24 Cm/sec Left Renal Artery Velocities: Origin:  85 cm/sec Mid:  61 cm/sec Hilum:  61 cm/sec Interlobar:  39 cm/sec Arcuate:   19 cm/sec Aortic Velocity: 87 Cm/sec Right Renal-Aortic Ratios: Origin: 0.94 Mid:  0.82 Hilum: 0.5 Interlobar: 0.43 Arcuate: 0.28 Left Renal-Aortic Ratios: Origin: 0.98 Mid: 0.71 Hilum: 0.71 Interlobar: 0.45 Arcuate: 0.22 Renal veins patent at the hilum. IMPRESSION: 1. No Doppler ultrasound evidence of hemodynamically significant renal artery stenosis. If there is continued clinical concern, renal MRA (lower radiation risk, can be performed noncontrast in the setting of renal dysfunction) and CTA ( higher spatial resolution) represent more accurate studies, which are additionally more sensitive to the detection of duplicated renal arteries. Electronically Signed   By: Lucrezia Europe M.D.   On: 05/08/2016 10:37    Procedures Procedures (including critical care time)  Medications Ordered in ED Medications  fluorescein ophthalmic strip 1 strip (not administered)  tetracaine (PONTOCAINE) 0.5 % ophthalmic solution 2 drop (not administered)     Initial Impression / Assessment and Plan / ED Course  I have reviewed the triage vital signs and the nursing notes.  Pertinent labs & imaging results that were available during my care of the patient were reviewed by me and considered in my medical decision making (see chart for details).     Patient's vision is 20/20 in the right eye and 20/25 in the left. No significant external abnormalities. He states his vision I she seems a little bit improved after the tetracaine was applied. No indication of conjunctivitis, eye trauma, or scleritis. While his intraocular pressure is mildly elevated in the right, this does not seem to be in the range of an acute angle closure glaucoma. He may have an more indolent process going on and so I have suggested he follow-up closely with an ophthalmologist. He does not like the one he is currently with until I've referred him to the on-call specialist. Discussed need to follow-up in the next 1-2 days. I highly doubt temporal  arteritis. No headache or neuro symptoms. Strict return precautions.  Final Clinical Impressions(s) / ED Diagnoses   Final diagnoses:  Visual disturbance    New Prescriptions New Prescriptions   No medications on file   I personally performed the services described in this documentation, which was scribed in my presence. The recorded information has been reviewed and is accurate.     [name], MD 05/10/16 [phone]

## 2016-05-09 NOTE — Patient Instructions (Signed)
   [facility] [address] [place] [address] Dept: [phone] Loc: [place]  05/09/2016  You are scheduled for a Peripheral Angiogram on Monday, April 23 with Dr. Quay Burow. (REP: Scott/ Dx: Claudication) Please arrive Sunday, April 22 for Pre-Hydration. Someone will call you that morning/afternoon to let you know when a bed is available. (75 cc normal saline/ hr)  1. Please arrive at the [facility] (Main Entrance A) at [facility]: [address] [place], [place]. Free valet parking service is available.   Special note: Every effort is made to have your procedure done on time. Please understand that emergencies sometimes delay scheduled procedures.  2. Diet: Do not eat or drink anything after midnight prior to your procedure except sips of water to take medications.  3. Labs: You will need to have blood drawn on Monday, April 16 at [place] [address], [place]  Open: [place] (Lunch 12:30 - 1:30)   Phone: [phone]. You do not need to be fasting.  4. Medication instructions in preparation for your procedure:  Stop taking Lasix (Furosemide) on Thursday, April 19.    On the morning of your procedure, take your Aspirin and any morning medicines NOT listed above.  You may use sips of water.  5. Plan for one night stay--bring personal belongings. 6. Bring a current list of your medications and current insurance cards. 7. You MUST have a responsible person to drive you home. 8. Someone MUST be with you the first 24 hours after you arrive home or your discharge will be delayed. 9. Please wear clothes that are easy to get on and off and wear slip-on shoes.  Thank you for allowing [name] to care for you!   -- [facility]

## 2016-05-09 NOTE — ED Triage Notes (Signed)
Pt endorses right eye visual changes x 1 week with blurred vision. Pt was admitted here in December for HTN and a-fib and was evaluated for temporal arteritis but was negative. Pt went to [facility] yesterday and had a stitch still in the incision and it was removed and he was told that his vision should improve but today pt's left eye is now affected. VSS. Pt able to see movement in all fields upon neuro exam.

## 2016-05-10 ENCOUNTER — Other Ambulatory Visit: Payer: Self-pay | Admitting: Cardiovascular Disease

## 2016-05-10 DIAGNOSIS — I739 Peripheral vascular disease, unspecified: Secondary | ICD-10-CM

## 2016-05-10 MED ORDER — SODIUM CHLORIDE 0.9 % IV SOLN: INTRAVENOUS | Status: DC

## 2016-05-10 NOTE — ED Notes (Signed)
Ophthalmic strips, tetracaine, tono pen, and woods lamp by the bed. Dr. Regenia Skeeter aware.

## 2016-05-10 NOTE — ED Notes (Signed)
Pt 20/25 on L eye visual acuity. R eye 20/20. Dr. Regenia Skeeter aware.

## 2016-05-12 NOTE — Addendum Note (Signed)
Addended by: Milderd Meager on: 05/12/2016 04:15 PM   Modules accepted: Orders

## 2016-05-25 NOTE — Telephone Encounter (Signed)
Close encounter 

## 2016-05-30 ENCOUNTER — Ambulatory Visit: Payer: Medicaid Other | Admitting: Internal Medicine

## 2016-06-01 LAB — CBC WITH DIFFERENTIAL/PLATELET
BASOS PCT: 0 %
Basophils Absolute: 0 cells/uL (ref 0–200)
EOS PCT: 0 %
Eosinophils Absolute: 0 cells/uL — ABNORMAL LOW (ref 15–500)
HCT: 31.3 % — ABNORMAL LOW (ref 38.5–50.0)
Hemoglobin: 10.6 g/dL — ABNORMAL LOW (ref 13.2–17.1)
LYMPHS PCT: 13 %
Lymphs Abs: 2236 cells/uL (ref 850–3900)
MCH: 31.6 pg (ref 27.0–33.0)
MCHC: 33.9 g/dL (ref 32.0–36.0)
MCV: 93.4 fL (ref 80.0–100.0)
MONOS PCT: 6 %
MPV: 8.9 fL (ref 7.5–12.5)
Monocytes Absolute: 1032 cells/uL — ABNORMAL HIGH (ref 200–950)
Neutro Abs: 13932 cells/uL — ABNORMAL HIGH (ref 1500–7800)
Neutrophils Relative %: 81 %
PLATELETS: 295 10*3/uL (ref 140–400)
RBC: 3.35 MIL/uL — AB (ref 4.20–5.80)
RDW: 14.7 % (ref 11.0–15.0)
WBC: 17.2 10*3/uL — AB (ref 3.8–10.8)

## 2016-06-01 LAB — BASIC METABOLIC PANEL WITH GFR
BUN: 27 mg/dL — ABNORMAL HIGH (ref 7–25)
CALCIUM: 9 mg/dL (ref 8.6–10.3)
CO2: 22 mmol/L (ref 20–31)
CREATININE: 1.61 mg/dL — AB (ref 0.70–1.33)
Chloride: 104 mmol/L (ref 98–110)
GFR, EST AFRICAN AMERICAN: 54 mL/min — AB (ref >=60)
GFR, EST NON AFRICAN AMERICAN: 46 mL/min — AB (ref >=60)
Glucose, Bld: 108 mg/dL — ABNORMAL HIGH (ref 65–99)
Potassium: 3.8 mmol/L (ref 3.5–5.3)
SODIUM: 137 mmol/L (ref 135–146)

## 2016-06-02 LAB — APTT: aPTT: 25 s (ref 22–34)

## 2016-06-02 LAB — PROTIME-INR
INR: 1.1
PROTHROMBIN TIME: 11.3 s (ref 9.0–11.5)

## 2016-06-02 LAB — TSH: TSH: 0.48 mIU/L (ref 0.40–4.50)

## 2016-06-04 ENCOUNTER — Ambulatory Visit (HOSPITAL_COMMUNITY)
Admission: RE | Admit: 2016-06-04 | Discharge: 2016-06-06 | Disposition: A | Discharge status: Home / Self Care | Payer: Medicaid Other | Source: Ambulatory Visit | Attending: Cardiovascular Disease | Admitting: Cardiovascular Disease

## 2016-06-04 ENCOUNTER — Encounter (HOSPITAL_COMMUNITY): Payer: Self-pay

## 2016-06-04 DIAGNOSIS — N183 Chronic kidney disease, stage 3 unspecified: Secondary | ICD-10-CM

## 2016-06-04 DIAGNOSIS — Z7982 Long term (current) use of aspirin: Secondary | ICD-10-CM | POA: Diagnosis not present

## 2016-06-04 DIAGNOSIS — E1122 Type 2 diabetes mellitus with diabetic chronic kidney disease: Secondary | ICD-10-CM | POA: Diagnosis not present

## 2016-06-04 DIAGNOSIS — I739 Peripheral vascular disease, unspecified: Secondary | ICD-10-CM | POA: Diagnosis not present

## 2016-06-04 DIAGNOSIS — F17210 Nicotine dependence, cigarettes, uncomplicated: Secondary | ICD-10-CM | POA: Insufficient documentation

## 2016-06-04 DIAGNOSIS — I70213 Atherosclerosis of native arteries of extremities with intermittent claudication, bilateral legs: Principal | ICD-10-CM | POA: Insufficient documentation

## 2016-06-04 DIAGNOSIS — I129 Hypertensive chronic kidney disease with stage 1 through stage 4 chronic kidney disease, or unspecified chronic kidney disease: Secondary | ICD-10-CM | POA: Diagnosis not present

## 2016-06-04 DIAGNOSIS — G4733 Obstructive sleep apnea (adult) (pediatric): Secondary | ICD-10-CM | POA: Diagnosis not present

## 2016-06-04 DIAGNOSIS — E1151 Type 2 diabetes mellitus with diabetic peripheral angiopathy without gangrene: Secondary | ICD-10-CM | POA: Diagnosis not present

## 2016-06-04 DIAGNOSIS — N1832 Chronic kidney disease, stage 3b: Secondary | ICD-10-CM

## 2016-06-04 DIAGNOSIS — D631 Anemia in chronic kidney disease: Secondary | ICD-10-CM | POA: Insufficient documentation

## 2016-06-04 HISTORY — DX: Cardiac murmur, unspecified: R01.1

## 2016-06-04 HISTORY — DX: Low back pain, unspecified: M54.50

## 2016-06-04 HISTORY — DX: Dependence on other enabling machines and devices: Z99.89

## 2016-06-04 HISTORY — DX: Other chronic pain: G89.29

## 2016-06-04 HISTORY — DX: Chronic kidney disease, stage 3 unspecified: N18.30

## 2016-06-04 HISTORY — DX: Chronic obstructive pulmonary disease, unspecified: J44.9

## 2016-06-04 HISTORY — DX: Peripheral vascular disease, unspecified: I73.9

## 2016-06-04 HISTORY — DX: Headache, unspecified: R51.9

## 2016-06-04 HISTORY — DX: Headache: R51

## 2016-06-04 HISTORY — DX: Obstructive sleep apnea (adult) (pediatric): G47.33

## 2016-06-04 HISTORY — DX: Gout, unspecified: M10.9

## 2016-06-04 HISTORY — DX: Low back pain: M54.5

## 2016-06-04 HISTORY — DX: Chronic kidney disease, stage 3 (moderate): N18.3

## 2016-06-04 LAB — BASIC METABOLIC PANEL
Anion gap: 6 (ref 5–15)
BUN: 27 mg/dL — ABNORMAL HIGH (ref 6–20)
CALCIUM: 8 mg/dL — AB (ref 8.9–10.3)
CO2: 23 mmol/L (ref 22–32)
CREATININE: 1.77 mg/dL — AB (ref 0.61–1.24)
Chloride: 106 mmol/L (ref 101–111)
GFR calc Af Amer: 47 mL/min — ABNORMAL LOW (ref >60)
GFR, EST NON AFRICAN AMERICAN: 41 mL/min — AB (ref >60)
GLUCOSE: 134 mg/dL — AB (ref 65–99)
Potassium: 4.4 mmol/L (ref 3.5–5.1)
Sodium: 135 mmol/L (ref 135–145)

## 2016-06-04 LAB — CBC WITH DIFFERENTIAL/PLATELET
BASOS ABS: 0 10*3/uL (ref 0.0–0.1)
BASOS PCT: 0 %
EOS ABS: 0 10*3/uL (ref 0.0–0.7)
Eosinophils Relative: 0 %
HCT: 28.8 % — ABNORMAL LOW (ref 39.0–52.0)
Hemoglobin: 9.7 g/dL — ABNORMAL LOW (ref 13.0–17.0)
Lymphocytes Relative: 8 %
Lymphs Abs: 0.9 10*3/uL (ref 0.7–4.0)
MCH: 31 pg (ref 26.0–34.0)
MCHC: 33.7 g/dL (ref 30.0–36.0)
MCV: 92 fL (ref 78.0–100.0)
MONO ABS: 0.4 10*3/uL (ref 0.1–1.0)
Monocytes Relative: 4 %
Neutro Abs: 9.8 10*3/uL — ABNORMAL HIGH (ref 1.7–7.7)
Neutrophils Relative %: 88 %
Platelets: 245 10*3/uL (ref 150–400)
RBC: 3.13 MIL/uL — ABNORMAL LOW (ref 4.22–5.81)
RDW: 14.5 % (ref 11.5–15.5)
WBC: 11.1 10*3/uL — ABNORMAL HIGH (ref 4.0–10.5)

## 2016-06-04 MED ORDER — ASPIRIN 81 MG PO CHEW
81.0000 mg | CHEWABLE_TABLET | ORAL | Status: AC
  Administered 2016-06-05: 81 mg via ORAL
  Filled 2016-06-04: qty 1

## 2016-06-04 MED ORDER — TIOTROPIUM BROMIDE MONOHYDRATE 18 MCG IN CAPS
18.0000 ug | ORAL_CAPSULE | Freq: Every day | RESPIRATORY_TRACT | Status: DC
  Administered 2016-06-05: 18 ug via RESPIRATORY_TRACT
  Filled 2016-06-04 (×2): qty 5

## 2016-06-04 MED ORDER — HYDRALAZINE HCL 50 MG PO TABS
75.0000 mg | ORAL_TABLET | Freq: Three times a day (TID) | ORAL | Status: DC
  Administered 2016-06-04 – 2016-06-06 (×5): 75 mg via ORAL
  Filled 2016-06-04 (×5): qty 1

## 2016-06-04 MED ORDER — MOMETASONE FURO-FORMOTEROL FUM 100-5 MCG/ACT IN AERO
2.0000 | INHALATION_SPRAY | Freq: Two times a day (BID) | RESPIRATORY_TRACT | Status: DC
  Administered 2016-06-04 – 2016-06-05 (×3): 2 via RESPIRATORY_TRACT
  Filled 2016-06-04 (×3): qty 8.8

## 2016-06-04 MED ORDER — CARVEDILOL 3.125 MG PO TABS
9.3750 mg | ORAL_TABLET | Freq: Two times a day (BID) | ORAL | Status: DC
  Administered 2016-06-04 – 2016-06-06 (×3): 9.375 mg via ORAL
  Filled 2016-06-04: qty 3
  Filled 2016-06-04 (×2): qty 1
  Filled 2016-06-04: qty 3

## 2016-06-04 MED ORDER — ASPIRIN EC 325 MG PO TBEC
325.0000 mg | DELAYED_RELEASE_TABLET | Freq: Every day | ORAL | Status: DC
  Administered 2016-06-04: 325 mg via ORAL
  Filled 2016-06-04: qty 1

## 2016-06-04 MED ORDER — ALBUTEROL SULFATE (2.5 MG/3ML) 0.083% IN NEBU: 3.0000 mL | INHALATION_SOLUTION | Freq: Four times a day (QID) | RESPIRATORY_TRACT | Status: DC | PRN

## 2016-06-04 MED ORDER — ISOSORBIDE MONONITRATE ER 30 MG PO TB24
30.0000 mg | ORAL_TABLET | Freq: Every day | ORAL | Status: DC
  Administered 2016-06-04 – 2016-06-06 (×2): 30 mg via ORAL
  Filled 2016-06-04 (×2): qty 1

## 2016-06-04 MED ORDER — ONDANSETRON HCL 4 MG/2ML IJ SOLN: 4.0000 mg | Freq: Four times a day (QID) | INTRAMUSCULAR | Status: DC | PRN

## 2016-06-04 MED ORDER — SODIUM CHLORIDE 0.9 % WEIGHT BASED INFUSION: 1.0000 mL/kg/h | INTRAVENOUS | Status: DC

## 2016-06-04 MED ORDER — DILTIAZEM HCL ER COATED BEADS 180 MG PO CP24
180.0000 mg | ORAL_CAPSULE | Freq: Every day | ORAL | Status: DC
  Administered 2016-06-06: 180 mg via ORAL
  Filled 2016-06-04 (×3): qty 1

## 2016-06-04 MED ORDER — SODIUM CHLORIDE 0.9 % WEIGHT BASED INFUSION: 3.0000 mL/kg/h | INTRAVENOUS | Status: DC

## 2016-06-04 MED ORDER — SODIUM CHLORIDE 0.9% FLUSH
3.0000 mL | INTRAVENOUS | Status: DC | PRN
  Administered 2016-06-04: 3 mL via INTRAVENOUS
  Filled 2016-06-04: qty 3

## 2016-06-04 MED ORDER — ACETAMINOPHEN 325 MG PO TABS: 650.0000 mg | ORAL_TABLET | ORAL | Status: DC | PRN

## 2016-06-04 MED ORDER — SODIUM CHLORIDE 0.9 % IV SOLN
INTRAVENOUS | Status: DC
  Administered 2016-06-04 – 2016-06-05 (×2): via INTRAVENOUS

## 2016-06-04 NOTE — H&P (Signed)
06/04/2016 Kevin [name]   06/04/1955  [id number]  Primary Physician Melina Schools, DO Primary Cardiologist: Lorretta Harp MD Renae Gloss  HPI:  Kevin [name] is a 60 year old married African-American male father of 5 daughters, grandfather 19 grandchildren who works as a Training and development officer. He was referred by Dr. Debara Pickett for peripheral vascular evaluation. He does have a history of hypertension and tobacco abuse. Never had a heart attack or stroke. Does complain of bilateral lower extremity claudication which is symmetric. This have mild to moderate renal insufficiency with a creatinine that runs in the 1.4-1.7 range. He is not on any ACE, ARBs  but is on furosemide. He complains of bilateral SI limiting claudication. Doppler suggests high frequency signals in both SFAs.  Current Facility-Administered Medications  Medication Dose Route Frequency Provider Last Rate Last Dose  . acetaminophen (TYLENOL) tablet 650 mg  650 mg Oral Q4H PRN Cheryln Manly, NP      . albuterol (PROVENTIL HFA;VENTOLIN HFA) 108 (90 Base) MCG/ACT inhaler 2 puff  2 puff Inhalation Q6H PRN Cheryln Manly, NP      . aspirin EC tablet 325 mg  325 mg Oral QHS Cheryln Manly, NP      . carvedilol (COREG) tablet 9.375 mg  9.375 mg Oral BID Cheryln Manly, NP      . diltiazem (CARDIZEM CD) 24 hr capsule 180 mg  180 mg Oral Daily Cheryln Manly, NP      . hydrALAZINE (APRESOLINE) tablet 75 mg  75 mg Oral TID Cheryln Manly, NP      . isosorbide mononitrate (IMDUR) 24 hr tablet 30 mg  30 mg Oral Daily Cheryln Manly, NP      . mometasone-formoterol (DULERA) 100-5 MCG/ACT inhaler 2 puff  2 puff Inhalation BID Cheryln Manly, NP      . ondansetron [facility]) injection 4 mg  4 mg Intravenous Q6H PRN Cheryln Manly, NP      . tiotropium [facility]) inhalation capsule 18 mcg  18 mcg Inhalation Daily Cheryln Manly, NP        No Known Allergies  Social History   Social History  . Marital  status: Single    Spouse name: N/A  . Number of children: N/A  . Years of education: N/A   Occupational History  . Not on file.   Social History Main Topics  . Smoking status: Current Every Day Smoker    Packs/day: 0.50    Years: 42.00    Types: Cigarettes  . Smokeless tobacco: Never Used     Comment: < 1 ppd  . Alcohol use Yes     Comment: socially  . Drug use: No  . Sexual activity: Not on file   Other Topics Concern  . Not on file   Social History Narrative  . No narrative on file     Review of Systems: General: negative for chills, fever, night sweats or weight changes.  Cardiovascular: negative for chest pain, dyspnea on exertion, edema, orthopnea, palpitations, paroxysmal nocturnal dyspnea or shortness of breath Dermatological: negative for rash Respiratory: negative for cough or wheezing Urologic: negative for hematuria Abdominal: negative for nausea, vomiting, diarrhea, bright red blood per rectum, melena, or hematemesis Neurologic: negative for visual changes, syncope, or dizziness All other systems reviewed and are otherwise negative except as noted above.  Blood pressure (!) 141/72, pulse (!) 58, temperature 98 F (36.7 C), temperature source Oral, resp. rate 16,  height 6' (1.829 m), weight 170 lb 3.1 oz (77.2 kg), SpO2 98 %.  General appearance: alert and no distress Neck: no adenopathy, no carotid bruit, no JVD, supple, symmetrical, trachea midline and thyroid not enlarged, symmetric, no tenderness/mass/nodules Lungs: clear to auscultation bilaterally Heart: regular rate and rhythm, S1, S2 normal, no murmur, click, rub or gallop Extremities: extremities normal, atraumatic, no cyanosis or edema  EKG sinus bradycardia 55 with evidence of LVH with repolarization changes. I personally reviewed this EKG.  ASSESSMENT AND PLAN:   Peripheral arterial disease ([place]) History of claudication referred by Dr. Debara Pickett today for evaluation. He has risk factors including  tobacco abuse and hypertension. He's had recent Doppler studies revealing a right ABI 0.9-11.71 with high frequency signals in both mid SFAs. He does have a creatinine that runs in the 1.4-1.5  range. I'm going to admit him on April 22 for hydration and arrange for him to undergo angiography with limited contrast the following day. We will access the right common femoral and image the left lower extremity.  Lorretta Harp MD [place] 05/09/2016 11:01am   Addendum: 06/04/16  Kevin [name] was seen as a direct admission for IV hydration prior to PV procedure planned with Dr. [name] tomorrow. Labs on 4/19 showed elevated Cr and WBC. Lung clear on exam.   Plan: check BMET, CBC now -- IVF 75cc/hr for hydration -- hold lasix -- BMET in am -- NPO after midnight  Signed, Reino Bellis, NP-C 06/04/2016, 1:29 PM Pager: [phone]

## 2016-06-04 NOTE — Progress Notes (Signed)
Patient arrived via direct admit to 2W room 06.  Telemetry monitor was applied and CCMD notified.  Patient was oriented to unit and room to include call light and phone.  Will continue to monitor.

## 2016-06-05 ENCOUNTER — Other Ambulatory Visit: Payer: Self-pay | Admitting: Student

## 2016-06-05 ENCOUNTER — Encounter (HOSPITAL_COMMUNITY): Payer: Self-pay

## 2016-06-05 ENCOUNTER — Ambulatory Visit (HOSPITAL_COMMUNITY)
Admission: RE | Disposition: A | Discharge status: Home / Self Care | Payer: Self-pay | Source: Ambulatory Visit | Attending: Cardiovascular Disease

## 2016-06-05 DIAGNOSIS — I739 Peripheral vascular disease, unspecified: Secondary | ICD-10-CM

## 2016-06-05 DIAGNOSIS — I70212 Atherosclerosis of native arteries of extremities with intermittent claudication, left leg: Secondary | ICD-10-CM | POA: Diagnosis not present

## 2016-06-05 DIAGNOSIS — I70213 Atherosclerosis of native arteries of extremities with intermittent claudication, bilateral legs: Secondary | ICD-10-CM | POA: Diagnosis not present

## 2016-06-05 HISTORY — PX: LOWER EXTREMITY INTERVENTION: CATH118252

## 2016-06-05 HISTORY — PX: PERIPHERAL VASCULAR INTERVENTION: CATH118257

## 2016-06-05 LAB — BASIC METABOLIC PANEL
ANION GAP: 5 (ref 5–15)
BUN: 27 mg/dL — ABNORMAL HIGH (ref 6–20)
CALCIUM: 8 mg/dL — AB (ref 8.9–10.3)
CO2: 24 mmol/L (ref 22–32)
CREATININE: 1.41 mg/dL — AB (ref 0.61–1.24)
Chloride: 110 mmol/L (ref 101–111)
GFR calc Af Amer: >60 mL/min (ref >60)
GFR calc non Af Amer: 53 mL/min — ABNORMAL LOW (ref >60)
GLUCOSE: 118 mg/dL — AB (ref 65–99)
Potassium: 4 mmol/L (ref 3.5–5.1)
Sodium: 139 mmol/L (ref 135–145)

## 2016-06-05 LAB — POCT ACTIVATED CLOTTING TIME
ACTIVATED CLOTTING TIME: 191 s
ACTIVATED CLOTTING TIME: 219 s
Activated Clotting Time: 219 seconds
Activated Clotting Time: 186 seconds
Activated Clotting Time: 164 seconds

## 2016-06-05 SURGERY — LOWER EXTREMITY INTERVENTION
Anesthesia: LOCAL

## 2016-06-05 MED ORDER — LIDOCAINE HCL 1 % IJ SOLN
INTRAMUSCULAR | Status: AC
  Filled 2016-06-05: qty 20

## 2016-06-05 MED ORDER — CLOPIDOGREL BISULFATE 300 MG PO TABS
ORAL_TABLET | ORAL | Status: DC | PRN
  Administered 2016-06-05: 300 mg via ORAL

## 2016-06-05 MED ORDER — ACETAMINOPHEN 325 MG PO TABS: 650.0000 mg | ORAL_TABLET | ORAL | Status: DC | PRN

## 2016-06-05 MED ORDER — HYDRALAZINE HCL 20 MG/ML IJ SOLN
INTRAMUSCULAR | Status: AC
  Filled 2016-06-05: qty 1

## 2016-06-05 MED ORDER — HEPARIN SODIUM (PORCINE) 1000 UNIT/ML IJ SOLN
INTRAMUSCULAR | Status: AC
  Filled 2016-06-05: qty 1

## 2016-06-05 MED ORDER — ONDANSETRON HCL 4 MG/2ML IJ SOLN: 4.0000 mg | Freq: Four times a day (QID) | INTRAMUSCULAR | Status: DC | PRN

## 2016-06-05 MED ORDER — HEPARIN (PORCINE) IN NACL 2-0.9 UNIT/ML-% IJ SOLN
INTRAMUSCULAR | Status: DC | PRN
  Administered 2016-06-05: 1000 mL

## 2016-06-05 MED ORDER — MIDAZOLAM HCL 2 MG/2ML IJ SOLN
INTRAMUSCULAR | Status: DC | PRN
  Administered 2016-06-05: 1 mg via INTRAVENOUS

## 2016-06-05 MED ORDER — MIDAZOLAM HCL 2 MG/2ML IJ SOLN
INTRAMUSCULAR | Status: AC
  Filled 2016-06-05: qty 2

## 2016-06-05 MED ORDER — ASPIRIN EC 81 MG PO TBEC
81.0000 mg | DELAYED_RELEASE_TABLET | Freq: Every day | ORAL | Status: DC
  Administered 2016-06-05: 81 mg via ORAL
  Filled 2016-06-05: qty 1

## 2016-06-05 MED ORDER — FENTANYL CITRATE (PF) 100 MCG/2ML IJ SOLN
INTRAMUSCULAR | Status: DC | PRN
  Administered 2016-06-05: 25 ug via INTRAVENOUS

## 2016-06-05 MED ORDER — HYDRALAZINE HCL 20 MG/ML IJ SOLN
INTRAMUSCULAR | Status: DC | PRN
  Administered 2016-06-05: 10 mg via INTRAVENOUS

## 2016-06-05 MED ORDER — HEPARIN SODIUM (PORCINE) 1000 UNIT/ML IJ SOLN
INTRAMUSCULAR | Status: DC | PRN
  Administered 2016-06-05: 4000 [IU] via INTRAVENOUS
  Administered 2016-06-05: 2000 [IU] via INTRAVENOUS
  Administered 2016-06-05: 7000 [IU] via INTRAVENOUS

## 2016-06-05 MED ORDER — HEPARIN (PORCINE) IN NACL 2-0.9 UNIT/ML-% IJ SOLN
INTRAMUSCULAR | Status: AC
  Filled 2016-06-05: qty 1000

## 2016-06-05 MED ORDER — SODIUM CHLORIDE 0.9 % IV SOLN
INTRAVENOUS | Status: DC
  Administered 2016-06-05: 13:00:00 via INTRAVENOUS

## 2016-06-05 MED ORDER — IODIXANOL 320 MG/ML IV SOLN
INTRAVENOUS | Status: DC | PRN
  Administered 2016-06-05: 155 mL via INTRA_ARTERIAL

## 2016-06-05 MED ORDER — CLOPIDOGREL BISULFATE 300 MG PO TABS
ORAL_TABLET | ORAL | Status: AC
  Filled 2016-06-05: qty 1

## 2016-06-05 MED ORDER — CLOPIDOGREL BISULFATE 75 MG PO TABS
75.0000 mg | ORAL_TABLET | Freq: Every day | ORAL | Status: DC
  Administered 2016-06-06: 09:00:00 75 mg via ORAL
  Filled 2016-06-05: qty 1

## 2016-06-05 MED ORDER — FENTANYL CITRATE (PF) 100 MCG/2ML IJ SOLN
INTRAMUSCULAR | Status: AC
  Filled 2016-06-05: qty 2

## 2016-06-05 MED ORDER — ANGIOPLASTY BOOK
Freq: Once | Status: AC
  Administered 2016-06-05: 22:00:00
  Filled 2016-06-05: qty 1

## 2016-06-05 MED ORDER — MORPHINE SULFATE (PF) 4 MG/ML IV SOLN
2.0000 mg | INTRAVENOUS | Status: DC | PRN
  Administered 2016-06-05: 18:00:00 2 mg via INTRAVENOUS

## 2016-06-05 MED ORDER — LIDOCAINE HCL (PF) 1 % IJ SOLN
INTRAMUSCULAR | Status: DC | PRN
  Administered 2016-06-05: 20 mL

## 2016-06-05 MED ORDER — MORPHINE SULFATE (PF) 2 MG/ML IV SOLN
INTRAVENOUS | Status: AC
  Filled 2016-06-05: qty 1

## 2016-06-05 MED ORDER — HYDRALAZINE HCL 20 MG/ML IJ SOLN
10.0000 mg | INTRAMUSCULAR | Status: DC | PRN
  Administered 2016-06-05: 10 mg via INTRAVENOUS
  Filled 2016-06-05: qty 1

## 2016-06-05 SURGICAL SUPPLY — 20 items
BALLN ANGIOSCULPT OTW 5X40 (BALLOONS) ×3
BALLN IN.PACT DCB 6X150 (BALLOONS) ×3
BALLOON ANGIOSCULPT OTW 5X40 (BALLOONS) ×2 IMPLANT
CATH ANGIO 5F PIGTAIL 65CM (CATHETERS) ×3 IMPLANT
CATH CROSS OVER TEMPO 5F (CATHETERS) ×3 IMPLANT
DCB IN.PACT 6X150 (BALLOONS) ×2 IMPLANT
DEVICE CONTINUOUS FLUSH (MISCELLANEOUS) ×3 IMPLANT
KIT ENCORE 26 ADVANTAGE (KITS) ×3 IMPLANT
KIT PV (KITS) ×3 IMPLANT
SHEATH HIGHFLEX ANSEL 7FR 55CM (SHEATH) ×3 IMPLANT
SHEATH PINNACLE 5F 10CM (SHEATH) ×3 IMPLANT
SHEATH PINNACLE 7F 10CM (SHEATH) ×3 IMPLANT
STOPCOCK MORSE 400PSI 3WAY (MISCELLANEOUS) ×3 IMPLANT
SYRINGE MEDRAD AVANTA MACH 7 (SYRINGE) ×3 IMPLANT
TAPE RADIOPAQUE TURBO (MISCELLANEOUS) ×3 IMPLANT
TRANSDUCER W/STOPCOCK (MISCELLANEOUS) ×3 IMPLANT
TRAY PV CATH (CUSTOM PROCEDURE TRAY) ×3 IMPLANT
TUBING CIL FLEX 10 FLL-RA (TUBING) ×3 IMPLANT
WIRE HITORQ VERSACORE ST 145CM (WIRE) ×3 IMPLANT
WIRE SPARTACORE .014X300CM (WIRE) ×3 IMPLANT

## 2016-06-05 NOTE — Care Management Note (Signed)
Case Management Note  Patient Details  Name: Kevin [name] MRN: [id number] Date of Birth: [birth year]  Subjective/Objective:  s/p  Peripheral Vascular Intervention, will be on plavix and asa.                 Action/Plan: NCM will follow for dc needs.  Expected Discharge Date:                  Expected Discharge Plan:     In-House Referral:     Discharge planning Services  CM Consult  Post Acute Care Choice:    Choice offered to:     DME Arranged:    DME Agency:     HH Arranged:    HH Agency:     Status of Service:  In process, will continue to follow  If discussed at Long Length of Stay Meetings, dates discussed:    Additional Comments:  [name], RN 06/05/2016, 1:56 PM

## 2016-06-05 NOTE — Progress Notes (Signed)
Right groin sheath removed. Pressure held for 30 minutes.  Patient tolerated procedure well.  A dry, sterile pressure dressing was applied.  Pull began at 1455 and ended at 1525.

## 2016-06-05 NOTE — Progress Notes (Signed)
Progress Note  Patient Name: Kevin [name] Date of Encounter: 06/05/2016  Primary Cardiologist: Mali Hilty MD PV- Quay Burow MD  Subjective   Feels well this am. Denies any pain at rest.   Inpatient Medications    Scheduled Meds: . aspirin  325 mg Oral QHS  . carvedilol  9.375 mg Oral BID  . diltiazem  180 mg Oral Daily  . hydrALAZINE  75 mg Oral TID  . isosorbide mononitrate  30 mg Oral Daily  . mometasone-formoterol  2 puff Inhalation BID  . tiotropium  18 mcg Inhalation Daily   Continuous Infusions: . sodium chloride Stopped (06/05/16 0531)  . sodium chloride 1 mL/kg/hr (06/05/16 0641)   PRN Meds: acetaminophen, albuterol, ondansetron (ZOFRAN) IV, sodium chloride flush   Vital Signs    Vitals:   06/04/16 2024 06/04/16 2026 06/05/16 0636 06/05/16 0732  BP:  127/64 (!) 143/59   Pulse:  64 61   Resp:  18 16   Temp:  98.5 F (36.9 C) 98.6 F (37 C)   TempSrc:  Oral Oral   SpO2: 99% 99% 99% 99%  Weight:   171 lb 8 oz (77.8 kg)   Height:        Intake/Output Summary (Last 24 hours) at 06/05/16 0836 Last data filed at 06/05/16 0732  Gross per 24 hour  Intake              840 ml  Output             1700 ml  Net             -860 ml   Filed Weights   06/04/16 1131 06/04/16 1543 06/05/16 0636  Weight: 170 lb 3.1 oz (77.2 kg) 170 lb 3.1 oz (77.2 kg) 171 lb 8 oz (77.8 kg)    Telemetry    NSR - Personally Reviewed  ECG    None today - Personally Reviewed  Physical Exam   GEN: WD BM No acute distress.   Neck: No JVD or carotid bruits Cardiac: RRR, no murmurs, rubs, or gallops. Pedal pulses are poor. No skin breakdown. Heavy calluses on toes.  Respiratory: Clear to auscultation bilaterally. GI: Soft, nontender, non-distended  MS: No edema; No deformity. Neuro:  Nonfocal  Psych: Normal affect   Labs    Chemistry Recent Labs Lab 06/01/16 1411 06/04/16 1547 06/05/16 0307  NA 137 135 139  K 3.8 4.4 4.0  CL 104 106 110  CO2 22 23 24     GLUCOSE 108* 134* 118*  BUN 27* 27* 27*  CREATININE 1.61* 1.77* 1.41*  CALCIUM 9.0 8.0* 8.0*  GFRNONAA 46* 41* 53*  GFRAA 54* 47* >60  ANIONGAP  --  6 5     Hematology Recent Labs Lab 06/01/16 1411 06/04/16 1547  WBC 17.2* 11.1*  RBC 3.35* 3.13*  HGB 10.6* 9.7*  HCT 31.3* 28.8*  MCV 93.4 92.0  MCH 31.6 31.0  MCHC 33.9 33.7  RDW 14.7 14.5  PLT 295 245    Cardiac EnzymesNo results for input(s): TROPONINI in the last 168 hours. No results for input(s): TROPIPOC in the last 168 hours.   BNPNo results for input(s): BNP, PROBNP in the last 168 hours.   DDimer No results for input(s): DDIMER in the last 168 hours.   Radiology    No results found.  Cardiac Studies   Recent Doppler studies revealing a right ABI 0.9-11.71 with high frequency signals in both mid SFAs.  Patient Profile  61 y.o. male presents with progressive claudication. Abnormal ABI. History of tobacco abuse and HTN  Assessment & Plan    1. PAD -progressive claudication-  plan angiography today to assess severity and treatment options.  2. CKD stage 3. With hydration creatinine improved 1.77>> 1.41.  3. HTN controlled. 4. Tobacco abuse. Counseled him on the need for smoking cessation. States he is "working on it"  5. Anemia of chronic renal disease.  6. OSA on Bipap 7. Abnormal nuclear stress test with inferior scar. No ischemia. EF 47%.   Signed, Peter Martinique, MD  06/05/2016, 8:36 AM

## 2016-06-05 NOTE — H&P (View-Only) (Signed)
Progress Note  Patient Name: Kevin [name] Date of Encounter: 06/05/2016  Primary Cardiologist: Mali Hilty MD PV- Quay Burow MD  Subjective   Feels well this am. Denies any pain at rest.   Inpatient Medications    Scheduled Meds: . aspirin  325 mg Oral QHS  . carvedilol  9.375 mg Oral BID  . diltiazem  180 mg Oral Daily  . hydrALAZINE  75 mg Oral TID  . isosorbide mononitrate  30 mg Oral Daily  . mometasone-formoterol  2 puff Inhalation BID  . tiotropium  18 mcg Inhalation Daily   Continuous Infusions: . sodium chloride Stopped (06/05/16 0531)  . sodium chloride 1 mL/kg/hr (06/05/16 0641)   PRN Meds: acetaminophen, albuterol, ondansetron (ZOFRAN) IV, sodium chloride flush   Vital Signs    Vitals:   06/04/16 2024 06/04/16 2026 06/05/16 0636 06/05/16 0732  BP:  127/64 (!) 143/59   Pulse:  64 61   Resp:  18 16   Temp:  98.5 F (36.9 C) 98.6 F (37 C)   TempSrc:  Oral Oral   SpO2: 99% 99% 99% 99%  Weight:   171 lb 8 oz (77.8 kg)   Height:        Intake/Output Summary (Last 24 hours) at 06/05/16 0836 Last data filed at 06/05/16 0732  Gross per 24 hour  Intake              840 ml  Output             1700 ml  Net             -860 ml   Filed Weights   06/04/16 1131 06/04/16 1543 06/05/16 0636  Weight: 170 lb 3.1 oz (77.2 kg) 170 lb 3.1 oz (77.2 kg) 171 lb 8 oz (77.8 kg)    Telemetry    NSR - Personally Reviewed  ECG    None today - Personally Reviewed  Physical Exam   GEN: WD BM No acute distress.   Neck: No JVD or carotid bruits Cardiac: RRR, no murmurs, rubs, or gallops. Pedal pulses are poor. No skin breakdown. Heavy calluses on toes.  Respiratory: Clear to auscultation bilaterally. GI: Soft, nontender, non-distended  MS: No edema; No deformity. Neuro:  Nonfocal  Psych: Normal affect   Labs    Chemistry Recent Labs Lab 06/01/16 1411 06/04/16 1547 06/05/16 0307  NA 137 135 139  K 3.8 4.4 4.0  CL 104 106 110  CO2 22 23 24     GLUCOSE 108* 134* 118*  BUN 27* 27* 27*  CREATININE 1.61* 1.77* 1.41*  CALCIUM 9.0 8.0* 8.0*  GFRNONAA 46* 41* 53*  GFRAA 54* 47* >60  ANIONGAP  --  6 5     Hematology Recent Labs Lab 06/01/16 1411 06/04/16 1547  WBC 17.2* 11.1*  RBC 3.35* 3.13*  HGB 10.6* 9.7*  HCT 31.3* 28.8*  MCV 93.4 92.0  MCH 31.6 31.0  MCHC 33.9 33.7  RDW 14.7 14.5  PLT 295 245    Cardiac EnzymesNo results for input(s): TROPONINI in the last 168 hours. No results for input(s): TROPIPOC in the last 168 hours.   BNPNo results for input(s): BNP, PROBNP in the last 168 hours.   DDimer No results for input(s): DDIMER in the last 168 hours.   Radiology    No results found.  Cardiac Studies   Recent Doppler studies revealing a right ABI 0.9-11.71 with high frequency signals in both mid SFAs.  Patient Profile  61 y.o. male presents with progressive claudication. Abnormal ABI. History of tobacco abuse and HTN  Assessment & Plan    1. PAD -progressive claudication-  plan angiography today to assess severity and treatment options.  2. CKD stage 3. With hydration creatinine improved 1.77>> 1.41.  3. HTN controlled. 4. Tobacco abuse. Counseled him on the need for smoking cessation. States he is "working on it"  5. Anemia of chronic renal disease.  6. OSA on Bipap 7. Abnormal nuclear stress test with inferior scar. No ischemia. EF 47%.   Signed, Gottlieb Zuercher [place], MD  06/05/2016, 8:36 AM

## 2016-06-05 NOTE — Discharge Summary (Signed)
Discharge Summary    Patient ID: Kevin [name],  MRN: [id number], DOB/AGE: [birth year] 61 y.o.  Admit date: 06/04/2016 Discharge date: 06/06/2016  Primary Care Provider: Melina Schools Primary Cardiologist: Dr. [name]   Discharge Diagnoses    Active Problems:   PVD (peripheral vascular disease) ([place])   Stage 3 chronic kidney disease   Claudication in peripheral vascular disease ([place])   History of Present Illness     Kevin [name] is a 61 y.o. male with past medical history of PAF, HTN, OSA (on CPAP) and recently diagnosed PVD who presented to [facility] on 06/04/2016 for hydration in regards to planned peripheral angiography.   He was examined in consult by Dr. [name] on 05/10/2015 and reported bilateral lower extremity claudication. Dopplers showed a 75-99% stenosis in the mid superficial femoral artery, bilaterally with 50-74% stenosis in the left profunda artery. Right ABI of 0.92 and left ABI of 0.71.  With his Stage 3 CKD and baseline creatinine of 1.4 - 1.5, he was admitted for hydration the day prior to his procedure.   Hospital Course     Consultants: None   Angiography showed a segmental 50-60% proximal to mid left SFA with a 95% fairly focal mid SFA just beyond this with 2 vessel runoff with the anterior tibial being occluded. There was a 70-90% stenoses in the mid right SFA with two-vessel runoff, anterior tibial occluded, and the posterior tibial had a long segmental mid 80% stenosis. Left SFA intervention was performed with angiosculpt cutting balloon atherectomy followed by drug-eluting balloon angioplasty. He was placed on DAPT with ASA and Plavix.   The following morning, he denied any recurrent pain along his lower extremities. No chest pain, palpitations, or dyspnea. Was ambulating without difficulty. Creatinine remained stable at 1.42. He was last examined by Dr. Martinique and deemed stable for discharge. A staff message has been sent to the office to arrange  for doppler studies for next week. Follow-up has been arranged with Dr. [name] in 3 weeks at which time staged right SFA intervention can be further discussed.   _____________  Discharge Physical Exam and Vitals Blood pressure (!) 203/86, pulse (!) 54, temperature 98.3 F (36.8 C), temperature source Oral, resp. rate 16, height 6' (1.829 m), weight 169 lb 12.1 oz (77 kg), SpO2 96 %.  Filed Weights   06/04/16 1543 06/05/16 0636 06/06/16 0258  Weight: 170 lb 3.1 oz (77.2 kg) 171 lb 8 oz (77.8 kg) 169 lb 12.1 oz (77 kg)   General: Pleasant African American male appearing in NAD Psych: Normal affect. Neuro: Alert and oriented X 3. Moves all extremities spontaneously. HEENT: Normal  Neck: Supple without bruits or JVD. Lungs:  Resp regular and unlabored, CTA without wheezing or rales. Heart: RRR no s3, s4, or murmurs. Abdomen: Soft, non-tender, non-distended, BS + x 4.  Extremities: No clubbing, cyanosis or edema. DP/PT/Radials 2+ and equal bilaterally. Right groin with large ecchymosis. No hematoma appreciated.   Labs & Radiologic Studies     CBC  Recent Labs  06/04/16 1547 06/06/16 0327  WBC 11.1* 14.1*  NEUTROABS 9.8*  --   HGB 9.7* 9.7*  HCT 28.8* 29.6*  MCV 92.0 93.7  PLT 245 993   Basic Metabolic Panel  Recent Labs  06/05/16 0307 06/06/16 0327  NA 139 138  K 4.0 4.0  CL 110 110  CO2 24 23  GLUCOSE 118* 133*  BUN 27* 22*  CREATININE 1.41* 1.42*  CALCIUM 8.0* 8.0*  Liver Function Tests No results for input(s): AST, ALT, ALKPHOS, BILITOT, PROT, ALBUMIN in the last 72 hours. No results for input(s): LIPASE, AMYLASE in the last 72 hours. Cardiac Enzymes No results for input(s): CKTOTAL, CKMB, CKMBINDEX, TROPONINI in the last 72 hours. BNP Invalid input(s): POCBNP D-Dimer No results for input(s): DDIMER in the last 72 hours. Hemoglobin A1C No results for input(s): HGBA1C in the last 72 hours. Fasting Lipid Panel No results for input(s): CHOL, HDL, LDLCALC,  TRIG, CHOLHDL, LDLDIRECT in the last 72 hours. Thyroid Function Tests No results for input(s): TSH, T4TOTAL, T3FREE, THYROIDAB in the last 72 hours.  Invalid input(s): FREET3  US Renal  Result Date: 05/08/2016 CLINICAL DATA:  Chronic kidney disease stage 3 EXAM: RENAL / URINARY TRACT ULTRASOUND COMPLETE COMPARISON:  CT 07/15/2015 FINDINGS: Right Kidney: Length: 11.0 cm. Mildly increased echotexture. No hydronephrosis or mass. Left Kidney: Length: 10.9 cm. Mildly increased echotexture. No hydronephrosis or mass. Bladder: Appears normal for degree of bladder distention. IMPRESSION: Mildly increased echotexture in the kidneys bilaterally suggesting chronic medical renal disease. No hydronephrosis. Electronically Signed   By: Rolm Baptise M.D.   On: 05/08/2016 09:39   US Renal Artery Stenosis  Addendum Date: 05/24/2016   ADDENDUM REPORT: 05/24/2016 17:06 ADDENDUM: There is an error in the original clinical data. There is no history of diabetes. Report should read as follows: CLINICAL DATA:  Chronic kidney disease stage 3, hypertension Electronically Signed   By: Lucrezia Europe M.D.   On: 05/24/2016 17:06   Result Date: 05/24/2016 CLINICAL DATA:  Diabetes, chronic kidney disease stage III, hypertension EXAM: RENAL DUPLEX DOPPLER ULTRASOUND TECHNIQUE: Duplex and color Doppler ultrasound was utilized to evaluate blood flow in the renal arteries and kidneys. COMPARISON:  CT 06/01/ 2017 FINDINGS: Right Renal Artery Velocities: Origin:  81 cm/sec Mid:  71 cm/sec Hilum:  43 cm/sec Interlobar:  37 cm/sec Arcuate: 24 Cm/sec Left Renal Artery Velocities: Origin:  85 cm/sec Mid:  61 cm/sec Hilum:  61 cm/sec Interlobar:  39 cm/sec Arcuate:  19 cm/sec Aortic Velocity: 87 Cm/sec Right Renal-Aortic Ratios: Origin: 0.94 Mid:  0.82 Hilum: 0.5 Interlobar: 0.43 Arcuate: 0.28 Left Renal-Aortic Ratios: Origin: 0.98 Mid: 0.71 Hilum: 0.71 Interlobar: 0.45 Arcuate: 0.22 Renal veins patent at the hilum. IMPRESSION: 1. No Doppler  ultrasound evidence of hemodynamically significant renal artery stenosis. If there is continued clinical concern, renal MRA (lower radiation risk, can be performed noncontrast in the setting of renal dysfunction) and CTA ( higher spatial resolution) represent more accurate studies, which are additionally more sensitive to the detection of duplicated renal arteries. Electronically Signed: By: Lucrezia Europe M.D. On: 05/08/2016 10:37    Diagnostic Studies/Procedures     Lower Extremity Angiography/ Peripheral Vascular Intervention: 06/05/2016  Pre Procedure Diagnosis: Peripheral arterial disease  Post Procedure Diagnosis: Peripheral arterial disease  Operators: Dr. Quay Burow  Procedures Performed:            1. Abdominal aortogram/bilateral iliac angiogram/bifemoral runoff            2. Contralateral access (second order catheter placement)            3. Cutting balloon atherectomy mid left SFA, drug-eluting balloon angioplasty mid left SFA              PROCEDURE DESCRIPTION:   The patient was brought to the second floor [facility] cath lab in the the postabsorptive state. He was premedicated with Valium 5 mg by mouth, IV Versed and fentanyl. His right groin  was prepped and shaved in usual sterile fashion. Xylocaine 1% was used for local anesthesia. A 5 French sheath was inserted into the right common femoral  artery using standard Seldinger technique. A 5 French pigtail catheter was placed in the mid abdominal aorta. Abdominal aortography, bilateral iliac angiography with bifemoral runoff was performed using bolus chase, digital subtraction and step table technique. Omnipaque dye was used for the entirety of the case. Retrograde aortic pressure was monitored during the case.  Angiographic Data:   1. Abdominal aortogram-the distal abdominal aorta was free of significant disease 2: Left lower extremity-there was a segmental 50-60% proximal to mid left SFA with a 95% fairly focal  mid SFA just beyond this with 2 vessel runoff. The anterior tibial is occluded 3: Right lower extremity-there are tandem 70-90% stenoses in the mid right SFA with two-vessel runoff. The anterior tibial is occluded. Posterior tibial had a long segmental mid 80% stenosis  IMPRESSION: Mr. Monte has bilateral mid SFA disease with lifestyle including claudication. We'll proceed with left SFA intervention using angiosculpt  cutting balloon atherectomy followed by drug-eluting balloon angioplasty.  Procedure Description: Contralateral access was obtained with a crossover catheter, Versed core wire and 7 [place] multipurpose 55 cm Ansel sheath (second order catheter placement). The patient received 13,000 units of heparin intravenously with an ACT of 219. A total of 155 mL of contrast was used. After demonstrating a therapeutic ACT the lesion was crossed with a 014 [place] core wire and cutting balloon atherectomy was performed with a 5 mm x 4 cm angiosculpt resulting in a small linear dissection. Following this along 6 mm x 150 mm long admiral drug eluding balloon was then carefully positioned positioned over the previously atherectomized segment as well as the long segment proximal to this and inflated at nominal pressures 4 to have metastases resulting reduction of long 50-60% stenosis to 0% residual and 95% mid left SFA stenosis to less than 30% residual with a small, linear nonflow limiting dissection. The patient tolerated procedure well. The sheath was then withdrawn across the bifurcation and exchanged over an 035 wire for a short 7 French sheath was then secured in place.  Final Impression: Successful mid left SFA cutting balloon atherectomy followed by drug-eluting balloon and plasty for lifestyle including claudication. Sheath will be removed once he is to follow-up below 170 pressure held. She'll be hydrated overnight and his renal function will be assessed the morning. He'll be discharged home on dual  antiplatelet therapy including aspirin and Plavix. We will obtain lower extremity arterial Doppler studies in our [facility] line office next week and I will see him back to 3 weeks thereafter. At that time we will consider performing staged right SFA intervention.     Disposition   Pt is being discharged home today in good condition.  Follow-up Plans & Appointments    Follow-up Information    Quay Burow, MD Follow up.   Specialties:  Cardiology, Radiology Why:  The office will call you to arrange doppler studies within the next 2 weeks. If you do not hear from them in 3 business days, please call the number provided. Follow-up with Dr. [name] has been arranged for 06/27/2016 at 8:40AM. Contact information: [address] [place] [address] [phone]          Discharge Instructions    Discharge instructions    Complete by:  As directed    PLEASE REMEMBER TO BRING ALL OF YOUR MEDICATIONS TO [facility]  OFFICE VISITS.  PLEASE ATTEND ALL SCHEDULED FOLLOW-UP APPOINTMENTS.   Activity: Increase activity slowly as tolerated. You may shower, but no soaking baths (or swimming) for 1 week. No driving for 24 hours. No lifting over 5 lbs for 1 week. No sexual activity for 1 week.   You May Return to Work: in 1 week (if applicable)  Wound Care: You may wash cath site gently with soap and water. Keep cath site clean and dry. If you notice pain, swelling, bleeding or pus at your cath site, please call [phone].   Increase activity slowly    Complete by:  As directed       Discharge Medications     Medication List    STOP taking these medications   ibuprofen 200 MG tablet Commonly known as:  ADVIL,MOTRIN     TAKE these medications   aspirin 81 MG EC tablet Take 1 tablet (81 mg total) by mouth at bedtime. What changed:  medication strength  how much to take  when to take this   CARTIA XT 180 MG 24 hr capsule Generic drug:   diltiazem TAKE ONE CAPSULE BY MOUTH DAILY   carvedilol 6.25 MG tablet Commonly known as:  COREG Take 1.5 tablets (9.375 mg total) by mouth 2 (two) times daily.   clopidogrel 75 MG tablet Commonly known as:  PLAVIX Take 1 tablet (75 mg total) by mouth daily with breakfast. Start taking on:  06/07/2016   DULERA 100-5 MCG/ACT Aero Generic drug:  mometasone-formoterol INHALE 2 PUFFS INTO THE LUNGS TWICE DAILY   furosemide 40 MG tablet Commonly known as:  LASIX TAKE 1 TABLET BY MOUTH DAILY   hydrALAZINE 50 MG tablet Commonly known as:  APRESOLINE TAKE 1 tablet (50 MG) EVERY 8 HOURS What changed:  how much to take  how to take this  when to take this  additional instructions   HYDROcodone-acetaminophen 5-325 MG tablet Commonly known as:  NORCO/VICODIN Take 1 tablet by mouth every 6 (six) hours as needed for pain. What changed:  Another medication with the same name was removed. Continue taking this medication, and follow the directions you see here.   isosorbide mononitrate 30 MG 24 hr tablet Commonly known as:  IMDUR TAKE 1 TABLET BY MOUTH DAILY   potassium chloride 10 MEQ tablet Commonly known as:  K-DUR Take 2 tablets (20 mEq total) by mouth daily.   predniSONE 10 MG tablet Commonly known as:  DELTASONE TAKE 1 TABLET BY MOUTH EVERY DAY WITH BREAKFAST   PROAIR HFA 108 (90 Base) MCG/ACT inhaler Generic drug:  albuterol INHALE 2 PUFFS INTO THE LUNGS EVERY 6 HOURS AS NEEDED FOR WHEEZING OR SHORTNESS OF BREATH   SPIRIVA HANDIHALER 18 MCG inhalation capsule Generic drug:  tiotropium INHALE CONTENTS OF 1 CAPSULE ONCE DAILY USING HANDIHALER        Allergies No Known Allergies   Outstanding Labs/Studies   Doppler Studies in 2 weeks.   Duration of Discharge Encounter   Greater than 30 minutes including physician time.  Signed, Erma Heritage, PA-C 06/06/2016, 9:24 AM Patient seen and examined and history reviewed. Agree with above findings and plan.  Patient is doing well this am. Notes improvement in leg and foot pain. VSS. Labs are stable. Renal function is a little better. On exam lungs are clear. CV RRR without gallop or murmur. Right groin with bruising but soft. Pulse 2+. Patient is stable for DC today.  Tihanna Goodson [place], [place] 06/06/2016 10:17 AM

## 2016-06-05 NOTE — Interval H&P Note (Signed)
History and Physical Interval Note:  06/05/2016 10:58 AM  Kevin [name]  has presented today for surgery, with the diagnosis of pad  The various methods of treatment have been discussed with the patient and family. After consideration of risks, benefits and other options for treatment, the patient has consented to  Procedure(s): Lower Extremity Intervention (N/A) as a surgical intervention .  The patient's history has been reviewed, patient examined, no change in status, stable for surgery.  I have reviewed the patient's chart and labs.  Questions were answered to the patient's satisfaction.     Quay Burow

## 2016-06-05 NOTE — Progress Notes (Signed)
Patient transported to cath lab for arteriogram.  CCMD  Aware of patient going off tele.  Mervyn Skeeters, RN

## 2016-06-06 ENCOUNTER — Telehealth: Payer: Self-pay | Admitting: Infectious Disease

## 2016-06-06 DIAGNOSIS — I70213 Atherosclerosis of native arteries of extremities with intermittent claudication, bilateral legs: Secondary | ICD-10-CM | POA: Diagnosis not present

## 2016-06-06 DIAGNOSIS — I739 Peripheral vascular disease, unspecified: Secondary | ICD-10-CM | POA: Diagnosis not present

## 2016-06-06 DIAGNOSIS — N183 Chronic kidney disease, stage 3 (moderate): Secondary | ICD-10-CM | POA: Diagnosis not present

## 2016-06-06 LAB — CBC
HCT: 29.6 % — ABNORMAL LOW (ref 39.0–52.0)
Hemoglobin: 9.7 g/dL — ABNORMAL LOW (ref 13.0–17.0)
MCH: 30.7 pg (ref 26.0–34.0)
MCHC: 32.8 g/dL (ref 30.0–36.0)
MCV: 93.7 fL (ref 78.0–100.0)
PLATELETS: 228 10*3/uL (ref 150–400)
RBC: 3.16 MIL/uL — AB (ref 4.22–5.81)
RDW: 14.7 % (ref 11.5–15.5)
WBC: 14.1 10*3/uL — AB (ref 4.0–10.5)

## 2016-06-06 LAB — BASIC METABOLIC PANEL
Anion gap: 5 (ref 5–15)
BUN: 22 mg/dL — ABNORMAL HIGH (ref 6–20)
CALCIUM: 8 mg/dL — AB (ref 8.9–10.3)
CO2: 23 mmol/L (ref 22–32)
CREATININE: 1.42 mg/dL — AB (ref 0.61–1.24)
Chloride: 110 mmol/L (ref 101–111)
GFR, EST NON AFRICAN AMERICAN: 53 mL/min — AB (ref >60)
Glucose, Bld: 133 mg/dL — ABNORMAL HIGH (ref 65–99)
Potassium: 4 mmol/L (ref 3.5–5.1)
Sodium: 138 mmol/L (ref 135–145)

## 2016-06-06 LAB — HIV ANTIBODY (ROUTINE TESTING W REFLEX): HIV SCREEN 4TH GENERATION: REACTIVE — AB

## 2016-06-06 MED ORDER — CLOPIDOGREL BISULFATE 75 MG PO TABS: 75.0000 mg | ORAL_TABLET | Freq: Every day | ORAL | 3 refills | Status: DC

## 2016-06-06 MED ORDER — ASPIRIN 81 MG PO TBEC: 81.0000 mg | DELAYED_RELEASE_TABLET | Freq: Every day | ORAL | Status: DC

## 2016-06-06 MED FILL — Lidocaine HCl Local Inj 1%: INTRAMUSCULAR | Qty: 20 | Status: AC

## 2016-06-06 NOTE — Telephone Encounter (Signed)
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 Epic fired today.  Can we send DIS to get him plugged in with [name] at [facility]. He has just been seen by VVS and Cardiology.

## 2016-06-07 ENCOUNTER — Telehealth: Payer: Self-pay | Admitting: Cardiovascular Disease

## 2016-06-07 LAB — HIV 1/2 AB DIFFERENTIATION
HIV 1 AB: POSITIVE — AB
HIV 2 AB: NEGATIVE

## 2016-06-07 NOTE — Telephone Encounter (Signed)
Phone call from [place] at [facility]. She was calling into speak with Dr. Kennon Holter nurse with critical lab results for the patient. Will route to the physician and his nurse for their knowledge.

## 2016-06-07 NOTE — Telephone Encounter (Signed)
New Message  Calling to provide results to MD-Berry's nurse.

## 2016-06-09 NOTE — Telephone Encounter (Signed)
I will inform DIS .  Laverle Patter, RN

## 2016-06-15 ENCOUNTER — Other Ambulatory Visit: Payer: Self-pay | Admitting: Cardiovascular Disease

## 2016-06-15 DIAGNOSIS — I739 Peripheral vascular disease, unspecified: Secondary | ICD-10-CM

## 2016-06-19 ENCOUNTER — Ambulatory Visit (HOSPITAL_COMMUNITY)
Admission: RE | Admit: 2016-06-19 | Discharge: 2016-06-19 | Disposition: A | Discharge status: Home / Self Care | Payer: Medicaid Other | Source: Ambulatory Visit | Attending: Cardiovascular Disease | Admitting: Cardiovascular Disease

## 2016-06-19 DIAGNOSIS — I739 Peripheral vascular disease, unspecified: Secondary | ICD-10-CM

## 2016-06-19 DIAGNOSIS — I70202 Unspecified atherosclerosis of native arteries of extremities, left leg: Secondary | ICD-10-CM | POA: Diagnosis not present

## 2016-06-27 ENCOUNTER — Encounter: Payer: Self-pay | Admitting: Cardiovascular Disease

## 2016-06-27 ENCOUNTER — Ambulatory Visit (HOSPITAL_COMMUNITY)
Admission: RE | Admit: 2016-06-27 | Discharge: 2016-06-27 | Disposition: A | Discharge status: Home / Self Care | Payer: Medicaid Other | Source: Ambulatory Visit | Attending: Cardiology | Admitting: Cardiology

## 2016-06-27 ENCOUNTER — Ambulatory Visit (INDEPENDENT_AMBULATORY_CARE_PROVIDER_SITE_OTHER): Payer: Medicaid Other | Admitting: Cardiovascular Disease

## 2016-06-27 VITALS — BP 156/86 | HR 54 | Ht 72.0 in | Wt 174.0 lb

## 2016-06-27 DIAGNOSIS — I739 Peripheral vascular disease, unspecified: Secondary | ICD-10-CM

## 2016-06-27 DIAGNOSIS — I480 Paroxysmal atrial fibrillation: Secondary | ICD-10-CM | POA: Diagnosis not present

## 2016-06-27 DIAGNOSIS — I10 Essential (primary) hypertension: Secondary | ICD-10-CM | POA: Insufficient documentation

## 2016-06-27 DIAGNOSIS — I729 Aneurysm of unspecified site: Secondary | ICD-10-CM | POA: Insufficient documentation

## 2016-06-27 DIAGNOSIS — J449 Chronic obstructive pulmonary disease, unspecified: Secondary | ICD-10-CM | POA: Diagnosis not present

## 2016-06-27 DIAGNOSIS — Z720 Tobacco use: Secondary | ICD-10-CM | POA: Insufficient documentation

## 2016-06-27 NOTE — Patient Instructions (Signed)
   [facility] [address] [place] [address] Dept: [phone] Loc: [place]  06/27/2016  You are scheduled for a Peripheral Angiogram on Monday, June 4 with Dr. Quay Burow.  1. Please arrive at the [facility] (Main Entrance A) at [facility]: [address] [place], [place] [address] Sunday June 3--someone will call you that morning to let you know when a bed is available.  Free valet parking service is available.   Special note: Every effort is made to have your procedure done on time. Please understand that emergencies sometimes delay scheduled procedures.  2. Diet: Do not eat or drink anything after midnight prior to your procedure except sips of water to take medications.  3. Labs: You will need to have blood drawn on Friday, May 25 at [place], [place]  Open: [place] (Lunch 12:30 - 1:30)   Phone: [phone]. You do not need to be fasting.  4. Medication instructions in preparation for your procedure:  On the morning of your procedure, take your Plavix/Clopidogrel and any morning medicines NOT listed above.  You may use sips of water.  HOLD Lasix for 2 days prior.  5. Plan for one night stay--bring personal belongings. 6. Bring a current list of your medications and current insurance cards. 7. You MUST have a responsible person to drive you home. 8. Someone MUST be with you the first 24 hours after you arrive home or your discharge will be delayed. 9. Please wear clothes that are easy to get on and off and wear slip-on shoes.  Thank you for allowing [name] to care for you!   -- [facility]

## 2016-06-27 NOTE — Assessment & Plan Note (Addendum)
Kevin [name] returns for follow-up of his recent peripheral angiogram and intervention which I performed 06/02/16. I performed cutting balloon atherectomy and drug-eluting to angioplasty of segmental mid left SFA stenosis. His claudication has resolved on that side and his Dopplers have improved. He does have residual disease in his right SFA which he he wishes to have addressed. We will arrange to have this done the next several weeks. He will hold his Lasix several days prior to the procedure, be admitted on Sunday for titration with the intent to perform the procedure the following day. I've talked about the importance of smoking cessation.

## 2016-06-27 NOTE — Progress Notes (Signed)
06/27/2016 Kevin [name]   February 17, 1955  [id number]  Primary Physician Kevin Bang, DO Primary Cardiologist: Kevin Harp MD Kevin [name], [place]  HPI:  .Kevin [name] is a 61 year old married African-American male father of 5 daughters, grandfather 6 grandchildren who works as a Training and development officer. He was referred by Dr. Debara [name] for peripheral vascular evaluation. I last saw him in the office 05/09/16. He does have a history of hypertension and tobacco abuse. Never had a heart attack or stroke. Does complain of bilateral lower extremity claudication which is symmetric. This have mild to moderate renal insufficiency with a creatinine that runs in the 1.4-1.7 range. He is not on any ACE, ARBs but is on furosemide. He complains of bilateral SI limiting claudication. Doppler suggests high frequency signals in both SFAs. Because of a serum creatinine of 1.7 he was admitted on 06/03/16 for hydration. Serum creatinine came down to 1.4. I demonstrated high-grade bilateral mid SFA disease with tibial disease as well. I intervened on his left SFA successfully. His follow-up Dopplers performed 06/19/16 revealed an increase in his left ABI from 0.71 up to 1.1. His left calf claudication has resolved. He wishes to have his right SFA addressed.   Current Outpatient Prescriptions  Medication Sig Dispense Refill  . aspirin EC 81 MG EC tablet Take 1 tablet (81 mg total) by mouth at bedtime.    Marland Kitchen CARTIA XT 180 MG 24 hr capsule TAKE ONE CAPSULE BY MOUTH DAILY 30 capsule 11  . carvedilol (COREG) 6.25 MG tablet Take 1.5 tablets (9.375 mg total) by mouth 2 (two) times daily. 90 tablet 5  . clopidogrel (PLAVIX) 75 MG tablet Take 1 tablet (75 mg total) by mouth daily with breakfast. 90 tablet 3  . DULERA 100-5 MCG/ACT AERO INHALE 2 PUFFS INTO THE LUNGS TWICE DAILY 13 g 0  . furosemide (LASIX) 40 MG tablet TAKE 1 TABLET BY MOUTH DAILY 30 tablet 11  . hydrALAZINE (APRESOLINE) 50 MG tablet TAKE 1 tablet (50 MG) EVERY  8 HOURS (Patient taking differently: Take 75 mg by mouth 3 (three) times daily. ) 135 tablet 5  . HYDROcodone-acetaminophen (NORCO/VICODIN) 5-325 MG tablet Take 1 tablet by mouth every 6 (six) hours as needed for pain.  0  . isosorbide mononitrate (IMDUR) 30 MG 24 hr tablet TAKE 1 TABLET BY MOUTH DAILY 30 tablet 11  . potassium chloride (K-DUR) 10 MEQ tablet Take 2 tablets (20 mEq total) by mouth daily. 180 tablet 3  . predniSONE (DELTASONE) 10 MG tablet TAKE 1 TABLET BY MOUTH EVERY DAY WITH BREAKFAST 30 tablet 0  . PROAIR HFA 108 (90 Base) MCG/ACT inhaler INHALE 2 PUFFS INTO THE LUNGS EVERY 6 HOURS AS NEEDED FOR WHEEZING OR SHORTNESS OF BREATH 8.5 g 0  . SPIRIVA HANDIHALER 18 MCG inhalation capsule INHALE CONTENTS OF 1 CAPSULE ONCE DAILY USING HANDIHALER 30 capsule 0   No current facility-administered medications for this visit.     No Known Allergies  Social History   Social History  . Marital status: Divorced    Spouse name: N/A  . Number of children: N/A  . Years of education: N/A   Occupational History  . Not on file.   Social History Main Topics  . Smoking status: Current Every Day Smoker    Packs/day: 0.50    Years: 42.00    Types: Cigarettes  . Smokeless tobacco: Never Used  . Alcohol use 1.2 oz/week    2 Cans of beer per week  .  Drug use: No  . Sexual activity: Not Currently   Other Topics Concern  . Not on file   Social History Narrative  . No narrative on file     Review of Systems: General: negative for chills, fever, night sweats or weight changes.  Cardiovascular: negative for chest pain, dyspnea on exertion, edema, orthopnea, palpitations, paroxysmal nocturnal dyspnea or shortness of breath Dermatological: negative for rash Respiratory: negative for cough or wheezing Urologic: negative for hematuria Abdominal: negative for nausea, vomiting, diarrhea, bright red blood per rectum, melena, or hematemesis Neurologic: negative for visual changes, syncope, or  dizziness All other systems reviewed and are otherwise negative except as noted above.    Blood pressure (!) 156/86, pulse (!) 54, height 6' (1.829 m), weight 174 lb (78.9 kg), SpO2 98 %.  General appearance: alert and no distress Neck: no adenopathy, no carotid bruit, no JVD, supple, symmetrical, trachea midline and thyroid not enlarged, symmetric, no tenderness/mass/nodules Lungs: clear to auscultation bilaterally Heart: regular rate and rhythm, S1, S2 normal, no murmur, click, rub or gallop Extremities: Resolving small hematoma right common femoral artery with a soft bruit  EKG not performed today  ASSESSMENT AND PLAN:   PVD (peripheral vascular disease) ([place]) Kevin [name] returns for follow-up of his recent peripheral angiogram and intervention which I performed 06/02/16. I performed cutting balloon atherectomy and drug-eluting to angioplasty of segmental mid left SFA stenosis. His claudication has resolved on that side and his Dopplers have improved. He does have residual disease in his right SFA which he he wishes to have addressed. We will arrange to have this done the next several weeks. He will hold his Lasix several days prior to the procedure, be admitted on Sunday for titration with the intent to perform the procedure the following day. I've talked about the importance of smoking cessation.      Kevin Harp MD FACP,FACC,FAHA, [facility] 06/27/2016 9:24 AM

## 2016-06-28 ENCOUNTER — Telehealth: Payer: Self-pay

## 2016-06-28 DIAGNOSIS — B20 Human immunodeficiency virus [HIV] disease: Secondary | ICD-10-CM

## 2016-06-28 NOTE — Telephone Encounter (Signed)
Called patient to schedule new patient visit after receiving staff note from Dr [name].  Patient answered the phone and I informed him I was calling from [facility] to schedule a new patient appointment.  He stated he was not referred to our office and I explained Dr. [name] made the referral. He then wanted to know the reason for the appointment and I asked if he had been called regarding any lab results and he stated no. I then asked if he had recently been in the hospital and possibly been seen by one of our providers and he stated no.  So I went back into the staff messages and reviewed Dr Lucianne Lei Dam's note stating he needed to get into care as soon as possible.  Reviewed the labs again which confirmed patient's positive HIV status.     Patient continued to  deny knowledge of HIV status.  I immediately  went to Dr [name] and discussed this situation and told him I think the patient was hearing the HIV positive test results for the first time and would like him to call and speak with the patient.   Ten minutes later patient's wifeHenriette Combs)  who has a legal signed consent in our medical records called to say she heard the whole conversation because Antonia had the phone on speaker.  She is insisting on knowing what is going on.     Situation explained to Dr [name] and he spoke with patient's wife.  After the conversation with Dr [name] patient's wife mentioned she saw the HIV results on my charts prior to calling our office but not before I called her spouse.   Couple scheduled as work in with Dr [name] on Monday, May 21. 2018 at 10:00 as a work in appointment.   [name]  is upset they are learning the results so late and feels this should have been addressed earlier especially when he has seen a provider since the positive results reported.   I offered information to patient so she could submit a formal complaint if she felt it was necessary.    Later in the  evening I called our local communicable disease branch  and spoke with Ventura Sellers who searched patient's file and reported Tawni Levy tested positive in July, 2006 while in jail.  This confirms patient was previously aware of his status and my phone call was not his first time hearing these test results.   DIS will meet with patient and his wife at [facility] after the appointment with Dr [name] to follow through with [place] [employer]. Laverle Patter, RN

## 2016-06-29 ENCOUNTER — Other Ambulatory Visit: Payer: Self-pay | Admitting: Internal Medicine

## 2016-07-03 ENCOUNTER — Other Ambulatory Visit (HOSPITAL_COMMUNITY)
Admission: RE | Admit: 2016-07-03 | Discharge: 2016-07-03 | Disposition: A | Discharge status: Home / Self Care | Payer: Medicaid Other | Source: Ambulatory Visit | Attending: Infectious Disease | Admitting: Infectious Disease

## 2016-07-03 ENCOUNTER — Encounter: Payer: Self-pay | Admitting: Infectious Disease

## 2016-07-03 ENCOUNTER — Ambulatory Visit (INDEPENDENT_AMBULATORY_CARE_PROVIDER_SITE_OTHER): Payer: Medicaid Other | Admitting: Infectious Disease

## 2016-07-03 VITALS — BP 157/85 | HR 60 | Temp 97.8°F | Wt 174.0 lb

## 2016-07-03 DIAGNOSIS — N183 Chronic kidney disease, stage 3 unspecified: Secondary | ICD-10-CM

## 2016-07-03 DIAGNOSIS — H538 Other visual disturbances: Secondary | ICD-10-CM

## 2016-07-03 DIAGNOSIS — I10 Essential (primary) hypertension: Secondary | ICD-10-CM | POA: Diagnosis not present

## 2016-07-03 DIAGNOSIS — B20 Human immunodeficiency virus [HIV] disease: Secondary | ICD-10-CM | POA: Insufficient documentation

## 2016-07-03 DIAGNOSIS — Z720 Tobacco use: Secondary | ICD-10-CM

## 2016-07-03 DIAGNOSIS — I5033 Acute on chronic diastolic (congestive) heart failure: Secondary | ICD-10-CM

## 2016-07-03 DIAGNOSIS — R531 Weakness: Secondary | ICD-10-CM | POA: Diagnosis not present

## 2016-07-03 DIAGNOSIS — Z111 Encounter for screening for respiratory tuberculosis: Secondary | ICD-10-CM | POA: Diagnosis not present

## 2016-07-03 DIAGNOSIS — I480 Paroxysmal atrial fibrillation: Secondary | ICD-10-CM | POA: Diagnosis not present

## 2016-07-03 DIAGNOSIS — I739 Peripheral vascular disease, unspecified: Secondary | ICD-10-CM

## 2016-07-03 LAB — CBC WITH DIFFERENTIAL/PLATELET
Basophils Absolute: 0 cells/uL (ref 0–200)
Basophils Relative: 0 %
EOS ABS: 84 {cells}/uL (ref 15–500)
Eosinophils Relative: 1 %
HEMATOCRIT: 30.9 % — AB (ref 38.5–50.0)
Hemoglobin: 9.8 g/dL — ABNORMAL LOW (ref 13.2–17.1)
Lymphocytes Relative: 21 %
Lymphs Abs: 1764 cells/uL (ref 850–3900)
MCH: 30.2 pg (ref 27.0–33.0)
MCHC: 31.7 g/dL — ABNORMAL LOW (ref 32.0–36.0)
MCV: 95.1 fL (ref 80.0–100.0)
MONO ABS: 672 {cells}/uL (ref 200–950)
MPV: 8.9 fL (ref 7.5–12.5)
Monocytes Relative: 8 %
NEUTROS ABS: 5880 {cells}/uL (ref 1500–7800)
Neutrophils Relative %: 70 %
Platelets: 239 10*3/uL (ref 140–400)
RBC: 3.25 MIL/uL — ABNORMAL LOW (ref 4.20–5.80)
RDW: 14.9 % (ref 11.0–15.0)
WBC: 8.4 10*3/uL (ref 3.8–10.8)

## 2016-07-03 MED ORDER — BICTEGRAVIR-EMTRICITAB-TENOFOV 50-200-25 MG PO TABS: 1.0000 | ORAL_TABLET | Freq: Every day | ORAL | 11 refills | Status: DC

## 2016-07-03 MED FILL — BIKTARVY 50-200-25 MG TABS: 50-200-25 | 30 days supply | Qty: 30 | Fill #0

## 2016-07-03 NOTE — Progress Notes (Signed)
Chief complaint: Establish care for newly diagnosed HIV.   Subjective:    Patient ID: Kevin [name], male    DOB: 10/17/1955, 61 y.o.   MRN: [id number]  HPI  Kevin [name] is a 61 year old after an man with multiple medical problems including coronary artery disease, COPD, smoking, paroxysmal atrial fibrillation chronic diastolic congestive heart failure, peripheral vascular disease, obstructive sleep apnea who had actually tested positive for HIV nearly 12 years ago in 2006 while he was incarcerated. Apparently per DIS officers documentation the patient was on interested in engaging in care at that time when they visited him on several occasions. He apparently The diagnosis a secret in the interim has married Kevin [name] who came with him today and father child with her. She tested negative during her pregnancy with their child. We are retesting her today.  I suspect the patient likely has progressed to AIDS given that is been 12 years since he was first diagnosed and he has never been on antiretroviral therapy. I see that he is being worked up by family medicine for worsening weakness without clear-cut cause beyond Kevin COPD and chronic kidney disease and CHF.  In any case she tested positive via prompting of the best practice alert for CDC recommendations of patient's 16-65 have not an HIV test in her system. This performed when he came in for catheterization and workup for Kevin peripheral vascular disease.  I went over 3 regimens that we could start N/A "test and treat model were we do not have information on Kevin genotype Kevin genetics Kevin hepatitis B coinfection status and he agreed to start [facility] which we [facility].   He may well need PCP prophylaxis if Kevin CD4 count is less than 200 and if so we will start him on Bactrim 3 times weekly in light of Kevin chronic kidney disease.     Past Medical History:  Diagnosis Date  . Chronic diastolic CHF (congestive heart  failure), NYHA class 3 ([place]) 01/2016  . Chronic lower back pain   . CKD (chronic kidney disease), stage III   . COPD (chronic obstructive pulmonary disease) ([place])   . Gout    "forearms, hands, ankles, feet" (06/05/2016)  . Headache    "weekly" (06/05/2016)  . Heart murmur   . Hypertension   . OSA on CPAP   . PAD (peripheral artery disease) ([place])   . PAF (paroxysmal atrial fibrillation) ([place]) 01/2016    Past Surgical History:  Procedure Laterality Date  . LOWER EXTREMITY INTERVENTION N/A 06/05/2016   Procedure: Lower Extremity Intervention;  Surgeon: Lorretta Harp, MD;  Location: [facility];  Service: Cardiovascular;  Laterality: N/A;  . PERIPHERAL VASCULAR INTERVENTION  06/05/2016   Procedure: Peripheral Vascular Intervention;  Surgeon: Lorretta Harp, MD;  Location: [facility];  Service: Cardiovascular;;  left SFA    Family History  Problem Relation Age of Onset  . High blood pressure Mother   . Lupus Mother       Social History   Social History  . Marital status: Divorced    Spouse name: N/A  . Number of children: N/A  . Years of education: N/A   Social History Main Topics  . Smoking status: Current Every Day Smoker    Packs/day: 0.50    Years: 42.00    Types: Cigarettes  . Smokeless tobacco: Never Used  . Alcohol use 1.2 oz/week    2 Cans of beer  per week  . Drug use: No  . Sexual activity: Not Currently   Other Topics Concern  . None   Social History Narrative  . None    No Known Allergies   Current Outpatient Prescriptions:  .  aspirin EC 81 MG EC tablet, Take 1 tablet (81 mg total) by mouth at bedtime., Disp: , Rfl:  .  CARTIA XT 180 MG 24 hr capsule, TAKE ONE CAPSULE BY MOUTH DAILY, Disp: 30 capsule, Rfl: 11 .  carvedilol (COREG) 6.25 MG tablet, Take 1.5 tablets (9.375 mg total) by mouth 2 (two) times daily., Disp: 90 tablet, Rfl: 5 .  clopidogrel (PLAVIX) 75 MG tablet, Take 1 tablet (75 mg total) by mouth daily with breakfast.,  Disp: 90 tablet, Rfl: 3 .  DULERA 100-5 MCG/ACT AERO, INHALE 2 PUFFS INTO THE LUNGS TWICE DAILY, Disp: 13 g, Rfl: 0 .  furosemide (LASIX) 40 MG tablet, TAKE 1 TABLET BY MOUTH DAILY, Disp: 30 tablet, Rfl: 11 .  hydrALAZINE (APRESOLINE) 50 MG tablet, TAKE 1 tablet (50 MG) EVERY 8 HOURS (Patient taking differently: Take 75 mg by mouth 3 (three) times daily. ), Disp: 135 tablet, Rfl: 5 .  HYDROcodone-acetaminophen (NORCO/VICODIN) 5-325 MG tablet, Take 1 tablet by mouth every 6 (six) hours as needed for pain., Disp: , Rfl: 0 .  isosorbide mononitrate (IMDUR) 30 MG 24 hr tablet, TAKE 1 TABLET BY MOUTH DAILY, Disp: 30 tablet, Rfl: 11 .  potassium chloride (K-DUR) 10 MEQ tablet, Take 2 tablets (20 mEq total) by mouth daily., Disp: 180 tablet, Rfl: 3 .  predniSONE (DELTASONE) 10 MG tablet, TAKE 1 TABLET BY MOUTH EVERY DAY WITH BREAKFAST, Disp: 30 tablet, Rfl: 0 .  PROAIR HFA 108 (90 Base) MCG/ACT inhaler, INHALE 2 PUFFS INTO THE LUNGS EVERY 6 HOURS AS NEEDED FOR WHEEZING OR SHORTNESS OF BREATH, Disp: 8.5 g, Rfl: 0 .  SPIRIVA HANDIHALER 18 MCG inhalation capsule, INHALE CONTENTS OF 1 CAPSULE ONCE DAILY USING HANDIHALER, Disp: 30 capsule, Rfl: 0 .  bictegravir-emtricitabine-tenofovir AF (BIKTARVY) 50-200-25 MG TABS tablet, Take 1 tablet by mouth daily., Disp: 30 tablet, Rfl: 11    Review of Systems  Constitutional: Negative for chills and fever.  Respiratory: Negative for cough, shortness of breath and wheezing.   Cardiovascular: Negative for chest pain, palpitations and leg swelling.  Gastrointestinal: Negative for abdominal pain.  Genitourinary: Negative for dysuria, flank pain and hematuria.  Musculoskeletal: Negative for back pain and myalgias.  Skin: Negative for rash.  Neurological: Positive for weakness. Negative for dizziness and headaches.  Hematological: Does not bruise/bleed easily.  Psychiatric/Behavioral: Negative for suicidal ideas.       Objective:   Physical Exam  Constitutional:  He is oriented to person, place, and time. No distress.  HENT:  Head: Normocephalic and atraumatic.  Mouth/Throat: No oropharyngeal exudate.  Eyes: Conjunctivae and EOM are normal. No scleral icterus.  Neck: Normal range of motion. Neck supple.  Cardiovascular: Normal rate, regular rhythm and intact distal pulses.  Exam reveals no gallop and no friction rub.   No murmur heard. Pulmonary/Chest: Effort normal and breath sounds normal. No respiratory distress. He has no wheezes.  Abdominal: Soft. He exhibits no distension.  Musculoskeletal: He exhibits no edema or tenderness.  Neurological: He is alert and oriented to person, place, and time. He exhibits normal muscle tone. Coordination normal.  Skin: Skin is warm and dry. No rash noted. He is not diaphoretic. No erythema. No pallor.  Psychiatric: Judgment and thought content normal. Kevin affect is  blunt. He is agitated. He exhibits a depressed mood. He is noncommunicative.  He seemed very much as if he wanted to leave the room for much of the interview. 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   I would like him to come back in a few weeks and be seen by [facility] infectious disease pharmacy and we can repeat a viral load at that time. I like to schedule him with me in June.  With through an exhaustive explanation of the nature of HIV and how it affects CD4 cells do pleats them and causes AIDS. We explained how if taken reliably antiretrovirals can is restore immune function and lead to life that is quantitatively and qualitatively nearly identical to someone who would be matched for all of Kevin other comorbidities.  We also explained the risk of transmission to Kevin [name] if she does not R to have  HIV and the idea of starting her on preexposure prophylaxis while we are making sure that he is getting Kevin virus under control.  Agitation: he denied depressive symptoms to me but I expect he is suffering from significant depressive symptoms. Per DIS officers he was informed of Kevin diagnosis 12 years ago but never engaged in care I am very worried that he has high risk for disengaging from care particular if he and Kevin wife do not stay together. I'm skeptical that he would've come to the appointment today if she had not brought him.  Again she'll be tested positive we will engage her in care if she is negative will place her on preexposure prophylaxis.    I spent greater than 60 minutes with the patient including greater than 50% of time in face to face counsel of the patient re HIV AID, choices of ARV in test and treat model, STI screening, and in coordination of Kevin care.

## 2016-07-03 NOTE — Progress Notes (Signed)
HPI: Kevin [name] is a 61 y.o. male who is here for his initial visit for his HIV.   Allergies: No Known Allergies  Vitals: Temp: 97.8 F (36.6 C) (05/21 1006) Temp Source: Oral (05/21 1006) BP: 157/85 (05/21 1006) Pulse Rate: 60 (05/21 1006)  Past Medical History: Past Medical History:  Diagnosis Date  . Chronic diastolic CHF (congestive heart failure), NYHA class 3 ([place]) 01/2016  . Chronic lower back pain   . CKD (chronic kidney disease), stage III   . COPD (chronic obstructive pulmonary disease) ([place])   . Gout    "forearms, hands, ankles, feet" (06/05/2016)  . Headache    "weekly" (06/05/2016)  . Heart murmur   . Hypertension   . OSA on CPAP   . PAD (peripheral artery disease) ([place])   . PAF (paroxysmal atrial fibrillation) ([place]) 01/2016    Social History: Social History   Social History  . Marital status: Divorced    Spouse name: N/A  . Number of children: N/A  . Years of education: N/A   Social History Main Topics  . Smoking status: Current Every Day Smoker    Packs/day: 0.50    Years: 42.00    Types: Cigarettes  . Smokeless tobacco: Never Used  . Alcohol use 1.2 oz/week    2 Cans of beer per week  . Drug use: No  . Sexual activity: Not Currently   Other Topics Concern  . None   Social History Narrative  . None    Previous Regimen: None  Current Regimen: None  Labs: No results found for: HIV1RNAQUANT, HIV1RNAVL, CD4TABS, HEPBSAB, HEPBSAG, HCVAB  CrCl: CrCl cannot be calculated (Patient's most recent lab result is older than the maximum 21 days allowed.).  Lipids: No results found for: CHOL, TRIG, HDL, CHOLHDL, VLDL, LDLCALC  Assessment: Kevin [name] saw Dr. Tommy [name] today as a new patient but apparently, he has been dx about 10 yrs ago and not on treatment. He is here with his wife today. She is going to get tested today also. He was incarcerated a while back. He has medicaid. Dr. Tommy [name] is going to start him on [place] pending lab results. He  is going to come back in a couple weeks to f/u with me to see if we need to adjust anything and check on his adherence.   His wife asked a lot of questions but he is a strange cat during the visit. We may need to follow him more closely. Spent a good 20 minutes counseling them both on the importance of therapy.   He is going to get a repeat peripheral angioplasty soon.   Recommendations:  Start Biktarvy 1 PO qday F/u with pharmacy in a few weeks  Kevin [name], PharmD, BCPS, AAHIVP, CPP Clinical [facility]ious [facility] 07/03/2016, 11:39 AM

## 2016-07-03 NOTE — Addendum Note (Signed)
Addended by: Laverle Patter on: 07/03/2016 02:36 PM   Modules accepted: Orders

## 2016-07-04 LAB — URINALYSIS
Bilirubin Urine: NEGATIVE
GLUCOSE, UA: NEGATIVE
HGB URINE DIPSTICK: NEGATIVE
Ketones, ur: NEGATIVE
LEUKOCYTES UA: NEGATIVE
NITRITE: NEGATIVE
PROTEIN: NEGATIVE
Specific Gravity, Urine: 1.015 (ref 1.001–1.035)
pH: 5.5 (ref 5.0–8.0)

## 2016-07-04 LAB — HEPATITIS B SURFACE ANTIGEN: Hepatitis B Surface Ag: NEGATIVE

## 2016-07-04 LAB — LIPID PANEL
CHOL/HDL RATIO: 2.8 ratio (ref <5.0)
Cholesterol: 207 mg/dL — ABNORMAL HIGH (ref <200)
HDL: 75 mg/dL (ref >40)
LDL Cholesterol: 114 mg/dL — ABNORMAL HIGH (ref <100)
Triglycerides: 88 mg/dL (ref <150)
VLDL: 18 mg/dL (ref <30)

## 2016-07-04 LAB — URINE CYTOLOGY ANCILLARY ONLY
Chlamydia: NEGATIVE
NEISSERIA GONORRHEA: NEGATIVE

## 2016-07-04 LAB — T-HELPER CELL (CD4) - (RCID CLINIC ONLY)
CD4 % Helper T Cell: 45 % (ref 33–55)
CD4 T Cell Abs: 740 /uL (ref 400–2700)

## 2016-07-04 LAB — HEPATITIS A ANTIBODY, TOTAL: Hep A Total Ab: NONREACTIVE

## 2016-07-04 LAB — HEPATITIS B SURFACE ANTIBODY,QUALITATIVE: Hep B S Ab: NEGATIVE

## 2016-07-04 LAB — HEPATITIS C ANTIBODY: HCV AB: NEGATIVE

## 2016-07-05 LAB — QUANTIFERON TB GOLD ASSAY (BLOOD)
INTERFERON GAMMA RELEASE ASSAY: NEGATIVE
MITOGEN-NIL SO: 8.76 [IU]/mL
Quantiferon Nil Value: 0.04 IU/mL
Quantiferon Tb Ag Minus Nil Value: 0.05 IU/mL

## 2016-07-06 LAB — HIV RNA, RTPCR W/R GT (RTI, PI,INT)
HIV-1 RNA, QN PCR: 3.87 Log copies/mL — ABNORMAL HIGH
HIV-1 RNA, QN PCR: 7440 copies/mL — ABNORMAL HIGH

## 2016-07-12 ENCOUNTER — Other Ambulatory Visit: Payer: Self-pay | Admitting: Cardiovascular Disease

## 2016-07-12 DIAGNOSIS — I739 Peripheral vascular disease, unspecified: Secondary | ICD-10-CM

## 2016-07-14 LAB — RFLX HIV-1 INTEGRASE GENOTYPE

## 2016-07-14 LAB — HLA B*5701: HLA-B 5701 W/RFLX HLA-B HIGH: NEGATIVE

## 2016-07-16 ENCOUNTER — Inpatient Hospital Stay (HOSPITAL_COMMUNITY)
Admission: RE | Admit: 2016-07-16 | Discharge: 2016-07-18 | DRG: 254 | Disposition: A | Discharge status: Home / Self Care | Payer: Medicaid Other | Source: Ambulatory Visit | Attending: Cardiovascular Disease | Admitting: Cardiovascular Disease

## 2016-07-16 ENCOUNTER — Encounter (HOSPITAL_COMMUNITY): Payer: Self-pay

## 2016-07-16 DIAGNOSIS — I739 Peripheral vascular disease, unspecified: Secondary | ICD-10-CM | POA: Diagnosis not present

## 2016-07-16 DIAGNOSIS — Z7982 Long term (current) use of aspirin: Secondary | ICD-10-CM

## 2016-07-16 DIAGNOSIS — I129 Hypertensive chronic kidney disease with stage 1 through stage 4 chronic kidney disease, or unspecified chronic kidney disease: Secondary | ICD-10-CM | POA: Diagnosis present

## 2016-07-16 DIAGNOSIS — I480 Paroxysmal atrial fibrillation: Secondary | ICD-10-CM | POA: Diagnosis present

## 2016-07-16 DIAGNOSIS — F17210 Nicotine dependence, cigarettes, uncomplicated: Secondary | ICD-10-CM | POA: Diagnosis present

## 2016-07-16 DIAGNOSIS — N183 Chronic kidney disease, stage 3 (moderate): Secondary | ICD-10-CM | POA: Diagnosis present

## 2016-07-16 DIAGNOSIS — G4733 Obstructive sleep apnea (adult) (pediatric): Secondary | ICD-10-CM | POA: Diagnosis present

## 2016-07-16 DIAGNOSIS — I70211 Atherosclerosis of native arteries of extremities with intermittent claudication, right leg: Principal | ICD-10-CM | POA: Diagnosis present

## 2016-07-16 DIAGNOSIS — Z21 Asymptomatic human immunodeficiency virus [HIV] infection status: Secondary | ICD-10-CM | POA: Diagnosis present

## 2016-07-16 DIAGNOSIS — Z7951 Long term (current) use of inhaled steroids: Secondary | ICD-10-CM

## 2016-07-16 LAB — CREATININE, SERUM
Creatinine, Ser: 1.43 mg/dL — ABNORMAL HIGH (ref 0.61–1.24)
GFR calc Af Amer: >60 mL/min (ref >60)
GFR calc non Af Amer: 53 mL/min — ABNORMAL LOW (ref >60)

## 2016-07-16 LAB — CBC WITH DIFFERENTIAL/PLATELET
Basophils Absolute: 0 10*3/uL (ref 0.0–0.1)
Basophils Relative: 0 %
EOS ABS: 0 10*3/uL (ref 0.0–0.7)
Eosinophils Relative: 1 %
HCT: 28.8 % — ABNORMAL LOW (ref 39.0–52.0)
Hemoglobin: 9.5 g/dL — ABNORMAL LOW (ref 13.0–17.0)
LYMPHS ABS: 2.1 10*3/uL (ref 0.7–4.0)
LYMPHS PCT: 24 %
MCH: 31.1 pg (ref 26.0–34.0)
MCHC: 33 g/dL (ref 30.0–36.0)
MCV: 94.4 fL (ref 78.0–100.0)
Monocytes Absolute: 0.7 10*3/uL (ref 0.1–1.0)
Monocytes Relative: 8 %
NEUTROS ABS: 5.8 10*3/uL (ref 1.7–7.7)
NEUTROS PCT: 67 %
Platelets: 240 10*3/uL (ref 150–400)
RBC: 3.05 MIL/uL — AB (ref 4.22–5.81)
RDW: 14.9 % (ref 11.5–15.5)
WBC: 8.6 10*3/uL (ref 4.0–10.5)

## 2016-07-16 LAB — PROTIME-INR
INR: 1.12
PROTHROMBIN TIME: 14.4 s (ref 11.4–15.2)

## 2016-07-16 MED ORDER — BICTEGRAVIR-EMTRICITAB-TENOFOV 50-200-25 MG PO TABS
1.0000 | ORAL_TABLET | Freq: Every day | ORAL | Status: DC
  Administered 2016-07-16 – 2016-07-18 (×2): 1 via ORAL
  Filled 2016-07-16 (×3): qty 1

## 2016-07-16 MED ORDER — POTASSIUM CHLORIDE CRYS ER 10 MEQ PO TBCR
20.0000 meq | EXTENDED_RELEASE_TABLET | Freq: Every day | ORAL | Status: DC
  Administered 2016-07-16 – 2016-07-18 (×2): 20 meq via ORAL
  Filled 2016-07-16 (×5): qty 2

## 2016-07-16 MED ORDER — HYDROCODONE-ACETAMINOPHEN 10-325 MG PO TABS
1.0000 | ORAL_TABLET | Freq: Every day | ORAL | Status: DC | PRN
  Administered 2016-07-17: 15:00:00 1 via ORAL
  Filled 2016-07-16: qty 1

## 2016-07-16 MED ORDER — TIOTROPIUM BROMIDE MONOHYDRATE 18 MCG IN CAPS
18.0000 ug | ORAL_CAPSULE | Freq: Every day | RESPIRATORY_TRACT | Status: DC
  Administered 2016-07-16 – 2016-07-18 (×3): 18 ug via RESPIRATORY_TRACT
  Filled 2016-07-16: qty 5

## 2016-07-16 MED ORDER — ASPIRIN EC 81 MG PO TBEC: 81.0000 mg | DELAYED_RELEASE_TABLET | Freq: Every day | ORAL | Status: DC

## 2016-07-16 MED ORDER — ACETAMINOPHEN 325 MG PO TABS: 650.0000 mg | ORAL_TABLET | ORAL | Status: DC | PRN

## 2016-07-16 MED ORDER — ALPRAZOLAM 0.25 MG PO TABS: 0.2500 mg | ORAL_TABLET | Freq: Two times a day (BID) | ORAL | Status: DC | PRN

## 2016-07-16 MED ORDER — NITROGLYCERIN 0.4 MG SL SUBL: 0.4000 mg | SUBLINGUAL_TABLET | SUBLINGUAL | Status: DC | PRN

## 2016-07-16 MED ORDER — ASPIRIN 300 MG RE SUPP: 300.0000 mg | RECTAL | Status: AC

## 2016-07-16 MED ORDER — ISOSORBIDE MONONITRATE ER 30 MG PO TB24
30.0000 mg | ORAL_TABLET | Freq: Every day | ORAL | Status: DC
  Administered 2016-07-16 – 2016-07-18 (×2): 30 mg via ORAL
  Filled 2016-07-16 (×2): qty 1

## 2016-07-16 MED ORDER — ALBUTEROL SULFATE (2.5 MG/3ML) 0.083% IN NEBU
3.0000 mL | INHALATION_SOLUTION | Freq: Four times a day (QID) | RESPIRATORY_TRACT | Status: DC | PRN
  Administered 2016-07-17: 3 mL via RESPIRATORY_TRACT
  Filled 2016-07-16: qty 3

## 2016-07-16 MED ORDER — SODIUM CHLORIDE 0.9% FLUSH: 3.0000 mL | INTRAVENOUS | Status: DC | PRN

## 2016-07-16 MED ORDER — HEPARIN SODIUM (PORCINE) 5000 UNIT/ML IJ SOLN
5000.0000 [IU] | Freq: Three times a day (TID) | INTRAMUSCULAR | Status: DC
  Administered 2016-07-16 – 2016-07-18 (×6): 5000 [IU] via SUBCUTANEOUS
  Filled 2016-07-16 (×6): qty 1

## 2016-07-16 MED ORDER — HYDRALAZINE HCL 50 MG PO TABS
50.0000 mg | ORAL_TABLET | Freq: Three times a day (TID) | ORAL | Status: DC
  Administered 2016-07-16 – 2016-07-18 (×6): 50 mg via ORAL
  Filled 2016-07-16 (×6): qty 1

## 2016-07-16 MED ORDER — SODIUM CHLORIDE 0.9 % IV SOLN: 250.0000 mL | INTRAVENOUS | Status: DC | PRN

## 2016-07-16 MED ORDER — DILTIAZEM HCL ER COATED BEADS 180 MG PO CP24
180.0000 mg | ORAL_CAPSULE | Freq: Every day | ORAL | Status: DC
  Administered 2016-07-16 – 2016-07-18 (×2): 180 mg via ORAL
  Filled 2016-07-16 (×2): qty 1

## 2016-07-16 MED ORDER — PREDNISONE 5 MG PO TABS
10.0000 mg | ORAL_TABLET | Freq: Every day | ORAL | Status: DC
  Administered 2016-07-17 – 2016-07-18 (×2): 10 mg via ORAL
  Filled 2016-07-16: qty 2
  Filled 2016-07-16 (×2): qty 1

## 2016-07-16 MED ORDER — CARVEDILOL 3.125 MG PO TABS
9.3750 mg | ORAL_TABLET | Freq: Two times a day (BID) | ORAL | Status: DC
  Administered 2016-07-16 – 2016-07-18 (×3): 9.375 mg via ORAL
  Filled 2016-07-16 (×2): qty 3
  Filled 2016-07-16 (×2): qty 1

## 2016-07-16 MED ORDER — TRAZODONE HCL 50 MG PO TABS: 100.0000 mg | ORAL_TABLET | Freq: Every evening | ORAL | Status: DC | PRN

## 2016-07-16 MED ORDER — FUROSEMIDE 40 MG PO TABS
40.0000 mg | ORAL_TABLET | Freq: Every day | ORAL | Status: DC
  Administered 2016-07-18: 11:00:00 40 mg via ORAL
  Filled 2016-07-16: qty 1

## 2016-07-16 MED ORDER — MOMETASONE FURO-FORMOTEROL FUM 100-5 MCG/ACT IN AERO
2.0000 | INHALATION_SPRAY | Freq: Two times a day (BID) | RESPIRATORY_TRACT | Status: DC
  Administered 2016-07-16 – 2016-07-18 (×4): 2 via RESPIRATORY_TRACT
  Filled 2016-07-16: qty 8.8

## 2016-07-16 MED ORDER — ZOLPIDEM TARTRATE 5 MG PO TABS: 5.0000 mg | ORAL_TABLET | Freq: Every evening | ORAL | Status: DC | PRN

## 2016-07-16 MED ORDER — SODIUM CHLORIDE 0.9 % IV SOLN
INTRAVENOUS | Status: AC
  Administered 2016-07-16: 15:00:00 via INTRAVENOUS

## 2016-07-16 MED ORDER — ONDANSETRON HCL 4 MG/2ML IJ SOLN: 4.0000 mg | Freq: Four times a day (QID) | INTRAMUSCULAR | Status: DC | PRN

## 2016-07-16 MED ORDER — TIOTROPIUM BROMIDE MONOHYDRATE 18 MCG IN CAPS
18.0000 ug | ORAL_CAPSULE | Freq: Every day | RESPIRATORY_TRACT | Status: DC
  Filled 2016-07-16: qty 5

## 2016-07-16 MED ORDER — ASPIRIN 81 MG PO CHEW
324.0000 mg | CHEWABLE_TABLET | ORAL | Status: AC
  Administered 2016-07-16: 324 mg via ORAL
  Filled 2016-07-16: qty 4

## 2016-07-16 MED ORDER — SODIUM CHLORIDE 0.9% FLUSH
3.0000 mL | Freq: Two times a day (BID) | INTRAVENOUS | Status: DC
  Administered 2016-07-16 – 2016-07-17 (×3): 3 mL via INTRAVENOUS

## 2016-07-16 MED ORDER — CLOPIDOGREL BISULFATE 75 MG PO TABS
75.0000 mg | ORAL_TABLET | Freq: Every day | ORAL | Status: DC
  Administered 2016-07-17: 75 mg via ORAL
  Filled 2016-07-16 (×2): qty 1

## 2016-07-16 NOTE — H&P (Signed)
06/27/2016 GERALDO HARIS   Apr 09, 1955  [id number]  Primary Physician [name], DO Primary Cardiologist: Lorretta Harp MD Lupe Carney, [place]  HPI:  .Mr. Lawhorn is a 61 year old married African-American male father of 5 daughters, grandfather 58 grandchildren who works as a Training and development officer. He was referred by Dr. Debara Pickett for peripheral vascular evaluation. I last saw him in the office 05/09/16. He does have a history of hypertension and tobacco abuse. Never had a heart attack or stroke. Does complain of bilateral lower extremity claudication which is symmetric. This have mild to moderate renal insufficiency with a creatinine that runs in the 1.4-1.7 range. He is not on any ACE, ARBs but is on furosemide. He complains of bilateral SI limiting claudication. Doppler suggests high frequency signals in both SFAs.Because of a serum creatinine of 1.7 he was admitted on 06/03/16 for hydration. Serum creatinine came down to 1.4. I demonstrated high-grade bilateral mid SFA disease with tibial disease as well. I intervened on his left SFA successfully. His follow-up Dopplers performed 06/19/16 revealed an increase in his left ABI from 0.71 up to 1.1. His left calf claudication has resolved. He wishes to have his right SFA addressed.         Current Outpatient Prescriptions  Medication Sig Dispense Refill  . aspirin EC 81 MG EC tablet Take 1 tablet (81 mg total) by mouth at bedtime.    Marland Kitchen CARTIA XT 180 MG 24 hr capsule TAKE ONE CAPSULE BY MOUTH DAILY 30 capsule 11  . carvedilol (COREG) 6.25 MG tablet Take 1.5 tablets (9.375 mg total) by mouth 2 (two) times daily. 90 tablet 5  . clopidogrel (PLAVIX) 75 MG tablet Take 1 tablet (75 mg total) by mouth daily with breakfast. 90 tablet 3  . DULERA 100-5 MCG/ACT AERO INHALE 2 PUFFS INTO THE LUNGS TWICE DAILY 13 g 0  . furosemide (LASIX) 40 MG tablet TAKE 1 TABLET BY MOUTH DAILY 30 tablet 11  . hydrALAZINE (APRESOLINE) 50 MG tablet TAKE 1  tablet (50 MG) EVERY 8 HOURS (Patient taking differently: Take 75 mg by mouth 3 (three) times daily. ) 135 tablet 5  . HYDROcodone-acetaminophen (NORCO/VICODIN) 5-325 MG tablet Take 1 tablet by mouth every 6 (six) hours as needed for pain.  0  . isosorbide mononitrate (IMDUR) 30 MG 24 hr tablet TAKE 1 TABLET BY MOUTH DAILY 30 tablet 11  . potassium chloride (K-DUR) 10 MEQ tablet Take 2 tablets (20 mEq total) by mouth daily. 180 tablet 3  . predniSONE (DELTASONE) 10 MG tablet TAKE 1 TABLET BY MOUTH EVERY DAY WITH BREAKFAST 30 tablet 0  . PROAIR HFA 108 (90 Base) MCG/ACT inhaler INHALE 2 PUFFS INTO THE LUNGS EVERY 6 HOURS AS NEEDED FOR WHEEZING OR SHORTNESS OF BREATH 8.5 g 0  . SPIRIVA HANDIHALER 18 MCG inhalation capsule INHALE CONTENTS OF 1 CAPSULE ONCE DAILY USING HANDIHALER 30 capsule 0   No current facility-administered medications for this visit.     No Known Allergies  Social History        Social History  . Marital status: Divorced    Spouse name: N/A  . Number of children: N/A  . Years of education: N/A      Occupational History  . Not on file.        Social History Main Topics  . Smoking status: Current Every Day Smoker    Packs/day: 0.50    Years: 42.00    Types: Cigarettes  . Smokeless tobacco:  Never Used  . Alcohol use 1.2 oz/week    2 Cans of beer per week  . Drug use: No  . Sexual activity: Not Currently       Other Topics Concern  . Not on file      Social History Narrative  . No narrative on file     Review of Systems: General: negative for chills, fever, night sweats or weight changes.  Cardiovascular: negative for chest pain, dyspnea on exertion, edema, orthopnea, palpitations, paroxysmal nocturnal dyspnea or shortness of breath Dermatological: negative for rash Respiratory: negative for cough or wheezing Urologic: negative for hematuria Abdominal: negative for nausea, vomiting, diarrhea, bright red blood per rectum, melena,  or hematemesis Neurologic: negative for visual changes, syncope, or dizziness All other systems reviewed and are otherwise negative except as noted above.    Blood pressure (!) 156/86, pulse (!) 54, height 6' (1.829 m), weight 174 lb (78.9 kg), SpO2 98 %.  General appearance: alert and no distress Neck: no adenopathy, no carotid bruit, no JVD, supple, symmetrical, trachea midline and thyroid not enlarged, symmetric, no tenderness/mass/nodules Lungs: clear to auscultation bilaterally Heart: regular rate and rhythm, S1, S2 normal, no murmur, click, rub or gallop Extremities: Resolving small hematoma right common femoral artery with a soft bruit  EKG not performed today  ASSESSMENT AND PLAN:   PVD (peripheral vascular disease) ([place]) Mr. Kirk returns for follow-up of his recent peripheral angiogram and intervention which I performed 06/02/16. I performed cutting balloon atherectomy and drug-eluting to angioplasty of segmental mid left SFA stenosis. His claudication has resolved on that side and his Dopplers have improved. He does have residual disease in his right SFA which he he wishes to have addressed. We will arrange to have this done the next several weeks. He will hold his Lasix several days prior to the procedure, be admitted on Sunday for titration with the intent to perform the procedure the following day. I've talked about the importance of smoking cessation.      Lorretta Harp MD FACP,FACC,FAHA, [facility] 06/27/2016 9:24 AM    Electronically signed by Lorretta Harp, MD at 06/27/2016 9:42 AM       Personally seen and examined. Agree with above. Awaiting PV procedure here for pre cath hydration RRR, lungs CTAB, alert, abd soft, no edema Creat 1.4-1.7 range.  Tobacco cessation PV angiogram with possible intervention tomorrow.   Candee Furbish, MD  Office Visit on 06/27/2016        Detailed Report

## 2016-07-16 NOTE — Progress Notes (Signed)
Dr. [name] paged about patient being on the unit. Dr. [name] request I contact the PA for admission orders.

## 2016-07-17 ENCOUNTER — Encounter: Payer: Self-pay | Admitting: Licensed Clinical Social Worker

## 2016-07-17 ENCOUNTER — Encounter (HOSPITAL_COMMUNITY)
Admission: RE | Disposition: A | Discharge status: Home / Self Care | Payer: Self-pay | Source: Ambulatory Visit | Attending: Cardiovascular Disease

## 2016-07-17 ENCOUNTER — Encounter (HOSPITAL_COMMUNITY): Payer: Self-pay | Admitting: *Deleted

## 2016-07-17 DIAGNOSIS — I70211 Atherosclerosis of native arteries of extremities with intermittent claudication, right leg: Secondary | ICD-10-CM | POA: Diagnosis present

## 2016-07-17 DIAGNOSIS — I129 Hypertensive chronic kidney disease with stage 1 through stage 4 chronic kidney disease, or unspecified chronic kidney disease: Secondary | ICD-10-CM | POA: Diagnosis present

## 2016-07-17 DIAGNOSIS — Z21 Asymptomatic human immunodeficiency virus [HIV] infection status: Secondary | ICD-10-CM | POA: Diagnosis present

## 2016-07-17 DIAGNOSIS — F17210 Nicotine dependence, cigarettes, uncomplicated: Secondary | ICD-10-CM | POA: Diagnosis present

## 2016-07-17 DIAGNOSIS — Z7951 Long term (current) use of inhaled steroids: Secondary | ICD-10-CM | POA: Diagnosis not present

## 2016-07-17 DIAGNOSIS — N183 Chronic kidney disease, stage 3 (moderate): Secondary | ICD-10-CM | POA: Diagnosis present

## 2016-07-17 DIAGNOSIS — I739 Peripheral vascular disease, unspecified: Secondary | ICD-10-CM | POA: Diagnosis not present

## 2016-07-17 DIAGNOSIS — N184 Chronic kidney disease, stage 4 (severe): Secondary | ICD-10-CM | POA: Diagnosis not present

## 2016-07-17 DIAGNOSIS — I480 Paroxysmal atrial fibrillation: Secondary | ICD-10-CM | POA: Diagnosis present

## 2016-07-17 DIAGNOSIS — Z7982 Long term (current) use of aspirin: Secondary | ICD-10-CM | POA: Diagnosis not present

## 2016-07-17 DIAGNOSIS — G4733 Obstructive sleep apnea (adult) (pediatric): Secondary | ICD-10-CM | POA: Diagnosis present

## 2016-07-17 HISTORY — PX: PERIPHERAL VASCULAR ATHERECTOMY: CATH118256

## 2016-07-17 HISTORY — PX: LOWER EXTREMITY ANGIOGRAPHY: CATH118251

## 2016-07-17 LAB — COMPREHENSIVE METABOLIC PANEL
ALBUMIN: 2.5 g/dL — AB (ref 3.5–5.0)
ALK PHOS: 33 U/L — AB (ref 38–126)
ALT: 16 U/L — AB (ref 17–63)
AST: 16 U/L (ref 15–41)
Anion gap: 3 — ABNORMAL LOW (ref 5–15)
BUN: 14 mg/dL (ref 6–20)
CALCIUM: 7.7 mg/dL — AB (ref 8.9–10.3)
CO2: 22 mmol/L (ref 22–32)
CREATININE: 1.25 mg/dL — AB (ref 0.61–1.24)
Chloride: 113 mmol/L — ABNORMAL HIGH (ref 101–111)
GFR calc Af Amer: >60 mL/min (ref >60)
GFR calc non Af Amer: >60 mL/min (ref >60)
GLUCOSE: 96 mg/dL (ref 65–99)
Potassium: 3.5 mmol/L (ref 3.5–5.1)
SODIUM: 138 mmol/L (ref 135–145)
Total Bilirubin: 0.3 mg/dL (ref 0.3–1.2)
Total Protein: 5.4 g/dL — ABNORMAL LOW (ref 6.5–8.1)

## 2016-07-17 LAB — POCT ACTIVATED CLOTTING TIME
ACTIVATED CLOTTING TIME: 219 s
ACTIVATED CLOTTING TIME: 213 s
Activated Clotting Time: 230 seconds

## 2016-07-17 LAB — LIPID PANEL
Cholesterol: 156 mg/dL (ref 0–200)
HDL: 50 mg/dL (ref >40)
LDL CALC: 81 mg/dL (ref 0–99)
Total CHOL/HDL Ratio: 3.1 RATIO
Triglycerides: 123 mg/dL (ref <150)
VLDL: 25 mg/dL (ref 0–40)

## 2016-07-17 SURGERY — LOWER EXTREMITY ANGIOGRAPHY
Anesthesia: LOCAL | Laterality: Right

## 2016-07-17 MED ORDER — ASPIRIN 81 MG PO CHEW
81.0000 mg | CHEWABLE_TABLET | ORAL | Status: AC
  Administered 2016-07-17: 81 mg via ORAL
  Filled 2016-07-17: qty 1

## 2016-07-17 MED ORDER — MIDAZOLAM HCL 2 MG/2ML IJ SOLN
INTRAMUSCULAR | Status: DC | PRN
  Administered 2016-07-17: 1 mg via INTRAVENOUS

## 2016-07-17 MED ORDER — ACETAMINOPHEN 325 MG PO TABS: 650.0000 mg | ORAL_TABLET | ORAL | Status: DC | PRN

## 2016-07-17 MED ORDER — HEPARIN SODIUM (PORCINE) 1000 UNIT/ML IJ SOLN
INTRAMUSCULAR | Status: DC | PRN
  Administered 2016-07-17: 8000 [IU] via INTRAVENOUS
  Administered 2016-07-17: 2000 [IU] via INTRAVENOUS
  Administered 2016-07-17: 3000 [IU] via INTRAVENOUS

## 2016-07-17 MED ORDER — SODIUM CHLORIDE 0.9% FLUSH: 3.0000 mL | INTRAVENOUS | Status: DC | PRN

## 2016-07-17 MED ORDER — IODIXANOL 320 MG/ML IV SOLN
INTRAVENOUS | Status: DC | PRN
  Administered 2016-07-17: 75 mL via INTRA_ARTERIAL

## 2016-07-17 MED ORDER — LIDOCAINE HCL 1 % IJ SOLN
INTRAMUSCULAR | Status: AC
  Filled 2016-07-17: qty 20

## 2016-07-17 MED ORDER — SODIUM CHLORIDE 0.9 % WEIGHT BASED INFUSION
3.0000 mL/kg/h | INTRAVENOUS | Status: DC
  Administered 2016-07-17: 3 mL/kg/h via INTRAVENOUS

## 2016-07-17 MED ORDER — SODIUM CHLORIDE 0.9 % IV SOLN
INTRAVENOUS | Status: AC
  Administered 2016-07-17 (×2): via INTRAVENOUS

## 2016-07-17 MED ORDER — ASPIRIN EC 81 MG PO TBEC
81.0000 mg | DELAYED_RELEASE_TABLET | Freq: Every day | ORAL | Status: DC
  Administered 2016-07-18: 81 mg via ORAL
  Filled 2016-07-17 (×2): qty 1

## 2016-07-17 MED ORDER — HYDRALAZINE HCL 20 MG/ML IJ SOLN: 10.0000 mg | INTRAMUSCULAR | Status: DC | PRN

## 2016-07-17 MED ORDER — HEPARIN (PORCINE) IN NACL 2-0.9 UNIT/ML-% IJ SOLN
INTRAMUSCULAR | Status: AC | PRN
  Administered 2016-07-17: 1000 mL via INTRA_ARTERIAL

## 2016-07-17 MED ORDER — HEPARIN SODIUM (PORCINE) 1000 UNIT/ML IJ SOLN
INTRAMUSCULAR | Status: AC
  Filled 2016-07-17: qty 1

## 2016-07-17 MED ORDER — HEPARIN (PORCINE) IN NACL 2-0.9 UNIT/ML-% IJ SOLN
INTRAMUSCULAR | Status: AC
  Filled 2016-07-17: qty 1000

## 2016-07-17 MED ORDER — SODIUM CHLORIDE 0.9 % WEIGHT BASED INFUSION
1.0000 mL/kg/h | INTRAVENOUS | Status: DC
Start: 2016-07-17 — End: 2016-07-17
  Administered 2016-07-17: 1 mL/kg/h via INTRAVENOUS

## 2016-07-17 MED ORDER — MIDAZOLAM HCL 2 MG/2ML IJ SOLN
INTRAMUSCULAR | Status: AC
Start: 2016-07-17 — End: 2016-07-17
  Filled 2016-07-17: qty 2

## 2016-07-17 MED ORDER — FENTANYL CITRATE (PF) 100 MCG/2ML IJ SOLN
INTRAMUSCULAR | Status: DC | PRN
  Administered 2016-07-17: 25 ug via INTRAVENOUS

## 2016-07-17 MED ORDER — ASPIRIN 81 MG PO CHEW: 81.0000 mg | CHEWABLE_TABLET | ORAL | Status: DC

## 2016-07-17 MED ORDER — CLOPIDOGREL BISULFATE 75 MG PO TABS
75.0000 mg | ORAL_TABLET | Freq: Every day | ORAL | Status: DC
  Administered 2016-07-18: 75 mg via ORAL
  Filled 2016-07-17: qty 1

## 2016-07-17 MED ORDER — SODIUM CHLORIDE 0.9 % WEIGHT BASED INFUSION: 1.0000 mL/kg/h | INTRAVENOUS | Status: DC

## 2016-07-17 MED ORDER — ONDANSETRON HCL 4 MG/2ML IJ SOLN: 4.0000 mg | Freq: Four times a day (QID) | INTRAMUSCULAR | Status: DC | PRN

## 2016-07-17 MED ORDER — FENTANYL CITRATE (PF) 100 MCG/2ML IJ SOLN
INTRAMUSCULAR | Status: AC
  Filled 2016-07-17: qty 2

## 2016-07-17 MED ORDER — SODIUM CHLORIDE 0.9 % WEIGHT BASED INFUSION: 3.0000 mL/kg/h | INTRAVENOUS | Status: DC

## 2016-07-17 MED ORDER — MORPHINE SULFATE (PF) 4 MG/ML IV SOLN
2.0000 mg | INTRAVENOUS | Status: DC | PRN
  Administered 2016-07-17 (×2): 2 mg via INTRAVENOUS
  Filled 2016-07-17 (×2): qty 1

## 2016-07-17 SURGICAL SUPPLY — 24 items
BAG SNAP BAND KOVER 36X36 (MISCELLANEOUS) ×3 IMPLANT
BALLN IN.PACT DCB 6X80 (BALLOONS) ×3
CATH CROSS OVER TEMPO 5F (CATHETERS) ×3 IMPLANT
CATH HAWKONE LS STANDARD TIP (CATHETERS) ×3
CATH HAWKONE LS STD TIP (CATHETERS) ×2 IMPLANT
DCB IN.PACT 6X80 (BALLOONS) ×2 IMPLANT
DEVICE CONTINUOUS FLUSH (MISCELLANEOUS) ×3 IMPLANT
DEVICE SPIDERFX EMB PROT 6MM (WIRE) ×3 IMPLANT
GUIDEWIRE ANGLED .035X150CM (WIRE) ×3 IMPLANT
KIT ENCORE 26 ADVANTAGE (KITS) ×3 IMPLANT
KIT PV (KITS) ×3 IMPLANT
SHEATH HIGHFLEX ANSEL 7FR 55CM (SHEATH) ×3 IMPLANT
SHEATH PINNACLE 5F 10CM (SHEATH) ×3 IMPLANT
SHEATH PINNACLE 7F 10CM (SHEATH) ×3 IMPLANT
STOPCOCK MORSE 400PSI 3WAY (MISCELLANEOUS) ×3 IMPLANT
SYR MEDRAD MARK V 150ML (SYRINGE) IMPLANT
SYRINGE MEDRAD AVANTA MACH 7 (SYRINGE) ×3 IMPLANT
TAPE RADIOPAQUE TURBO (MISCELLANEOUS) ×3 IMPLANT
TRANSDUCER W/STOPCOCK (MISCELLANEOUS) ×3 IMPLANT
TRAY PV CATH (CUSTOM PROCEDURE TRAY) ×3 IMPLANT
TUBING CIL FLEX 10 FLL-RA (TUBING) ×3 IMPLANT
WIRE HITORQ VERSACORE ST 145CM (WIRE) ×3 IMPLANT
WIRE ROSEN-J .035X180CM (WIRE) ×3 IMPLANT
WIRE SPARTACORE .014X300CM (WIRE) ×3 IMPLANT

## 2016-07-17 NOTE — Care Management Note (Signed)
Case Management Note  Patient Details  Name: Kevin [name] MRN: [id number] Date of Birth: 1955/07/31  Subjective/Objective:    From home, s/p pv intervention, will be on plavix., has [employer].  PCP   Phill Myron              Action/Plan: NCM will follow for dc needs.   Expected Discharge Date:  07/17/16               Expected Discharge Plan:  Home/Self Care  In-House Referral:     Discharge planning Services  CM Consult  Post Acute Care Choice:    Choice offered to:     DME Arranged:    DME Agency:     HH Arranged:    HH Agency:     Status of Service:  Completed, signed off  If discussed at [employer] of Stay Meetings, dates discussed:    Additional Comments:  [name], RN 07/17/2016, 4:35 PM

## 2016-07-17 NOTE — Interval H&P Note (Signed)
History and Physical Interval Note:  07/17/2016 9:37 AM  Kevin [name]  has presented today for surgery, with the diagnosis of pvd  The various methods of treatment have been discussed with the patient and family. After consideration of risks, benefits and other options for treatment, the patient has consented to  Procedure(s): Lower Extremity Angiography (N/A) as a surgical intervention .  The patient's history has been reviewed, patient examined, no change in status, stable for surgery.  I have reviewed the patient's chart and labs.  Questions were answered to the patient's satisfaction.     Quay Burow

## 2016-07-17 NOTE — Progress Notes (Signed)
Progress Note  Patient Name: Kevin [name] Date of Encounter: 07/17/2016  Primary Cardiologist: [name]  Subjective   No complaints this morning. He is admitted for " residual disease in his right SFA which he he wishes to have addressed" after having hydration overnight.  Inpatient Medications    Scheduled Meds: . aspirin EC  81 mg Oral QHS  . [START ON 07/18/2016] aspirin EC  81 mg Oral Daily  . bictegravir-emtricitabine-tenofovir AF  1 tablet Oral Daily  . carvedilol  9.375 mg Oral BID WC  . clopidogrel  75 mg Oral Q breakfast  . [START ON 07/18/2016] clopidogrel  75 mg Oral Q breakfast  . diltiazem  180 mg Oral Daily  . [START ON 07/18/2016] furosemide  40 mg Oral Daily  . heparin  5,000 Units Subcutaneous Q8H  . hydrALAZINE  50 mg Oral Q8H  . isosorbide mononitrate  30 mg Oral Daily  . mometasone-formoterol  2 puff Inhalation BID  . potassium chloride  20 mEq Oral Daily  . predniSONE  10 mg Oral Q breakfast  . sodium chloride flush  3 mL Intravenous Q12H  . tiotropium  18 mcg Inhalation Daily   Continuous Infusions: . sodium chloride    . sodium chloride 75 mL/hr at 07/17/16 1131   PRN Meds: sodium chloride, acetaminophen, albuterol, ALPRAZolam, hydrALAZINE, HYDROcodone-acetaminophen, morphine injection, nitroGLYCERIN, ondansetron (ZOFRAN) IV, sodium chloride flush, traZODone, zolpidem   Vital Signs    Vitals:   07/17/16 1130 07/17/16 1145 07/17/16 1200 07/17/16 1230  BP: (!) 142/83 (!) 169/77 (!) 163/82 137/80  Pulse: (!) 48 (!) 51 (!) 45 (!) 47  Resp:      Temp:      TempSrc:      SpO2: 100% 100% 100% 100%  Weight:      Height:        Intake/Output Summary (Last 24 hours) at 07/17/16 1309 Last data filed at 07/17/16 1200  Gross per 24 hour  Intake           835.27 ml  Output              450 ml  Net           385.27 ml   Filed Weights   07/16/16 1252 07/17/16 0454  Weight: 173 lb 8 oz (78.7 kg) 176 lb 2.4 oz (79.9 kg)    Telemetry    Sinus rhythm -  Personally Reviewed  ECG    Sinus bradycardia with prominent voltage. - Personally Reviewed  Physical Exam   GEN: No acute distress.   Neck: No JVD Cardiac: RRR, no murmurs, rubs, or gallops. Mild reduction in right lower extremity pedal pulses. Respiratory: Clear to auscultation bilaterally. GI: Soft, nontender, non-distended  MS: No edema; No deformity. Neuro:  Nonfocal  Psych: Normal affect   Labs    Chemistry Recent Labs Lab 07/16/16 1420 07/17/16 0226  NA  --  138  K  --  3.5  CL  --  113*  CO2  --  22  GLUCOSE  --  96  BUN  --  14  CREATININE 1.43* 1.25*  CALCIUM  --  7.7*  PROT  --  5.4*  ALBUMIN  --  2.5*  AST  --  16  ALT  --  16*  ALKPHOS  --  33*  BILITOT  --  0.3  GFRNONAA 53* >60  GFRAA >60 >60  ANIONGAP  --  3*     Hematology Recent Labs  Lab 07/16/16 1420  WBC 8.6  RBC 3.05*  HGB 9.5*  HCT 28.8*  MCV 94.4  MCH 31.1  MCHC 33.0  RDW 14.9  PLT 240    Cardiac EnzymesNo results for input(s): TROPONINI in the last 168 hours. No results for input(s): TROPIPOC in the last 168 hours.   BNPNo results for input(s): BNP, PROBNP in the last 168 hours.   DDimer No results for input(s): DDIMER in the last 168 hours.   Radiology    No results found.  Cardiac Studies   None  Patient Profile     61 y.o. male smoker with COPD, PAD, chronic kidney disease, hypertension, HIV, who is admitted for pre-hydration before right superficial femoral artery intervention by Dr. [name].  Assessment & Plan    1. PAD with claudication 2. CKD stage III with improvement after overnight hydration, creatinine this morning 1.25.  Signed, Sinclair Grooms, MD  07/17/2016, 1:09 PM

## 2016-07-17 NOTE — Discharge Summary (Signed)
The patient has been seen in conjunction with Harlan Stains, NP. All aspects of care have been considered and discussed. The patient has been personally interviewed, examined, and all clinical data has been reviewed.   Patient did well after PV procedure yesterday by Dr. [name].  Labs and vascular access site unremarkable/stable.  Discharge as planned with follow-up as outlined below.   Discharge Summary    Patient ID: Kevin [name],  MRN: [id number], DOB/AGE: Oct 04, 1955 61 y.o.  Admit date: 07/16/2016 Discharge date: 07/18/2016  Primary Care Provider: [name] Primary Cardiologist: [name]   Discharge Diagnoses    Active Problems:   PVD (peripheral vascular disease) ([place])   Allergies No Known Allergies  Diagnostic Studies/Procedures    PV angiogram: 07/17/16  Procedures Performed:            1. Contralateral access (second order catheter placement)            2. Placement of a spider distal protection device in the above-the-knee popliteal artery            3. Hawk 1 directional arthrectomy mid right SFA            4. Drug-eluting balloon angioplasty mid right SFA  Final Impression: Successful Hawk 1 directional atherectomy followed by drug eluting balloon angioplasty using distal protection of a high-grade mid right SFA stenosis for lifestyle limiting claudication. The patient tolerated the procedure well. The Ansel sheath was then exchanged over wire for a short 7 [place] sheath which was then secured in place. The patient was already on aspirin and Plavix. He left the lab in stable condition. The sheath will be removed once he is to follow below 170 and pressure held. He'll be hydrated overnight and discharged home in the morning. We will obtain lower extremity arterial Doppler studies in our [facility]  next week and I will see him back in 2-3 weeks thereafter.  Quay Burow. MD, [facility] _____________   History of Present Illness     61 yo  male with past medical history of PAF, HTN, OSA (on CPAP) and recently diagnosed PVD in 4/18 and underwent PV angiogram that showed high-grade bilateral mid SFA disease with tibial disease as well. Dr. [name] intervened on his left SFA successfully. His follow-up Dopplers performed 06/19/16 revealed an increase in his left ABI from 0.71 up to 1.1. His left calf claudication had resolved. He was seen back in the office on 06/27/16 and wanted to have intervention done to the right SFA. He was admitted for pre hydration.    Hospital Course     Underwent PV angiogram, with Dr. [name] on 07/17/16 noted above with successful [facility] 1 directional atherectomy followed by drug eluting angioplasty to high grade mid right SFA stenosis. Plan will be to continue ASA/plavix. He was admitted overnight for IV hydration. Post cath labs showed Cr 1.35 (1.25 pre) stable, and Hgb 8.8 which is slightly lower than pre-procedure, likely dilutional..   He was seen by Dr. Tamala Julian and determined stable for discharge home. Follow in the office and dopplers have been arranged. Medications are listed below.   The left femoral access site did not reveal evidence of hematoma. A good distal pulse was noted. Kidney function is stable as noted above. The patient has ambulated. Plan for check of hemoglobin and basic metabolic panel within 7 days. _____________  Discharge Vitals Blood pressure (!) 156/78, pulse 68, temperature 97.8 F (36.6 C), temperature source Oral, resp. rate  15, height 6' (1.829 m), weight 177 lb 0.5 oz (80.3 kg), SpO2 98 %.  Filed Weights   07/16/16 1252 07/17/16 0454 07/18/16 0600  Weight: 173 lb 8 oz (78.7 kg) 176 lb 2.4 oz (79.9 kg) 177 lb 0.5 oz (80.3 kg)    Labs & Radiologic Studies    CBC  Recent Labs  07/16/16 1420 07/18/16 0331  WBC 8.6 9.6  NEUTROABS 5.8  --   HGB 9.5* 8.8*  HCT 28.8* 26.5*  MCV 94.4 94.0  PLT 240 672   Basic Metabolic Panel  Recent Labs  07/17/16 0226 07/18/16 0331  NA 138  137  K 3.5 3.6  CL 113* 107  CO2 22 23  GLUCOSE 96 121*  BUN 14 17  CREATININE 1.25* 1.35*  CALCIUM 7.7* 8.1*   Liver Function Tests  Recent Labs  07/17/16 0226  AST 16  ALT 16*  ALKPHOS 33*  BILITOT 0.3  PROT 5.4*  ALBUMIN 2.5*   No results for input(s): LIPASE, AMYLASE in the last 72 hours. Cardiac Enzymes No results for input(s): CKTOTAL, CKMB, CKMBINDEX, TROPONINI in the last 72 hours. BNP Invalid input(s): POCBNP D-Dimer No results for input(s): DDIMER in the last 72 hours. Hemoglobin A1C No results for input(s): HGBA1C in the last 72 hours. Fasting Lipid Panel  Recent Labs  07/17/16 0226  CHOL 156  HDL 50  LDLCALC 81  TRIG 123  CHOLHDL 3.1   Thyroid Function Tests No results for input(s): TSH, T4TOTAL, T3FREE, THYROIDAB in the last 72 hours.  Invalid input(s): FREET3 _____________  No results found. Disposition   Pt is being discharged home today in good condition.  Follow-up Plans & Appointments    Follow-up Information    Lorretta Harp, MD Follow up on 08/04/2016.   Specialties:  Cardiology, Radiology Why:  at 10:20am for your follow up appt.  Contact information: [address] [place] [address] [phone]        [facility] Follow up on 08/04/2016.   Specialty:  Cardiology Why:  at 12:30pm for your follow up dopplers.  Contact information: [address] [id number] [place] [phone]       [facility] Follow up on 07/24/2016.   Specialty:  Cardiology Why:  Please come in for follow up labs.  Contact information: [address] [place] [phone]         Discharge Instructions    Call MD for:  redness, tenderness, or signs of infection (pain, swelling, redness, odor or green/yellow discharge around incision site)    Complete by:  As directed    Diet - low sodium heart  healthy    Complete by:  As directed    Discharge instructions    Complete by:  As directed    Groin Site Care Refer to this sheet in the next few weeks. These instructions provide you with information on caring for yourself after your procedure. Your caregiver may also give you more specific instructions. Your treatment has been planned according to current medical practices, but problems sometimes occur. Call your caregiver if you have any problems or questions after your procedure. HOME CARE INSTRUCTIONS You may shower 24 hours after the procedure. Remove the bandage (dressing) and gently wash the site with plain soap and water. Gently pat the site dry.  Do not apply powder or lotion to the site.  Do not sit in a bathtub,  swimming pool, or whirlpool for 5 to 7 days.  No bending, squatting, or lifting anything over 10 pounds (4.5 kg) as directed by your caregiver.  Inspect the site at least twice daily.  Do not drive home if you are discharged the same day of the procedure. Have someone else drive you.  You may drive 24 hours after the procedure unless otherwise instructed by your caregiver.  What to expect: Any bruising will usually fade within 1 to 2 weeks.  Blood that collects in the tissue (hematoma) may be painful to the touch. It should usually decrease in size and tenderness within 1 to 2 weeks.  SEEK IMMEDIATE MEDICAL CARE IF: You have unusual pain at the groin site or down the affected leg.  You have redness, warmth, swelling, or pain at the groin site.  You have drainage (other than a small amount of blood on the dressing).  You have chills.  You have a fever or persistent symptoms for more than 72 hours.  You have a fever and your symptoms suddenly get worse.  Your leg becomes pale, cool, tingly, or numb.  You have heavy bleeding from the site. Hold pressure on the site. .   Increase activity slowly    Complete by:  As directed       Discharge Medications   Current  Discharge Medication List    CONTINUE these medications which have CHANGED   Details  aspirin 81 MG EC tablet Take 1 tablet (81 mg total) by mouth daily. Qty: 30 tablet, Refills: 12      CONTINUE these medications which have NOT CHANGED   Details  bictegravir-emtricitabine-tenofovir AF (BIKTARVY) 50-200-25 MG TABS tablet Take 1 tablet by mouth daily. Qty: 30 tablet, Refills: 11   Associated Diagnoses: HIV disease (HCC)    CARTIA XT 180 MG 24 hr capsule TAKE ONE CAPSULE BY MOUTH DAILY Qty: 30 capsule, Refills: 11    carvedilol (COREG) 6.25 MG tablet Take 1.5 tablets (9.375 mg total) by mouth 2 (two) times daily. Qty: 90 tablet, Refills: 5    clopidogrel (PLAVIX) 75 MG tablet Take 1 tablet (75 mg total) by mouth daily with breakfast. Qty: 90 tablet, Refills: 3    DULERA 100-5 MCG/ACT AERO INHALE 2 PUFFS INTO THE LUNGS TWICE DAILY Qty: 13 g, Refills: 0    furosemide (LASIX) 40 MG tablet TAKE 1 TABLET BY MOUTH DAILY Qty: 30 tablet, Refills: 11    hydrALAZINE (APRESOLINE) 50 MG tablet TAKE 1 tablet (50 MG) EVERY 8 HOURS Qty: 135 tablet, Refills: 5   Associated Diagnoses: Essential hypertension    HYDROcodone-acetaminophen (NORCO) 10-325 MG tablet Take 1 tablet by mouth daily as needed for pain. Refills: 0    isosorbide mononitrate (IMDUR) 30 MG 24 hr tablet TAKE 1 TABLET BY MOUTH DAILY Qty: 30 tablet, Refills: 11    potassium chloride (K-DUR) 10 MEQ tablet Take 2 tablets (20 mEq total) by mouth daily. Qty: 180 tablet, Refills: 3    PROAIR HFA 108 (90 Base) MCG/ACT inhaler INHALE 2 PUFFS INTO THE LUNGS EVERY 6 HOURS AS NEEDED FOR WHEEZING OR SHORTNESS OF BREATH Qty: 8.5 g, Refills: 0    SPIRIVA HANDIHALER 18 MCG inhalation capsule INHALE CONTENTS OF 1 CAPSULE ONCE DAILY USING HANDIHALER Qty: 30 capsule, Refills: 0    traZODone (DESYREL) 100 MG tablet Take 100 mg by mouth at bedtime as needed for sleep.      STOP taking these medications     predniSONE (DELTASONE) 10 MG  tablet           Outstanding Labs/Studies   BMET/CBC- 6/11  Duration of Discharge Encounter   Greater than 30 minutes including physician time.  Signed, Reino Bellis NP-C 07/18/2016, 10:30 AM

## 2016-07-17 NOTE — Interval H&P Note (Signed)
History and Physical Interval Note:  07/17/2016 9:38 AM  Kevin [name]  has presented today for surgery, with the diagnosis of pvd  The various methods of treatment have been discussed with the patient and family. After consideration of risks, benefits and other options for treatment, the patient has consented to  Procedure(s): Lower Extremity Angiography (N/A) as a surgical intervention .  The patient's history has been reviewed, patient examined, no change in status, stable for surgery.  I have reviewed the patient's chart and labs.  Questions were answered to the patient's satisfaction.     Quay Burow

## 2016-07-18 ENCOUNTER — Other Ambulatory Visit: Payer: Self-pay | Admitting: Cardiology

## 2016-07-18 DIAGNOSIS — I739 Peripheral vascular disease, unspecified: Secondary | ICD-10-CM

## 2016-07-18 LAB — CBC
HEMATOCRIT: 26.5 % — AB (ref 39.0–52.0)
Hemoglobin: 8.8 g/dL — ABNORMAL LOW (ref 13.0–17.0)
MCH: 31.2 pg (ref 26.0–34.0)
MCHC: 33.2 g/dL (ref 30.0–36.0)
MCV: 94 fL (ref 78.0–100.0)
PLATELETS: 241 10*3/uL (ref 150–400)
RBC: 2.82 MIL/uL — ABNORMAL LOW (ref 4.22–5.81)
RDW: 14.9 % (ref 11.5–15.5)
WBC: 9.6 10*3/uL (ref 4.0–10.5)

## 2016-07-18 LAB — BASIC METABOLIC PANEL
Anion gap: 7 (ref 5–15)
BUN: 17 mg/dL (ref 6–20)
CO2: 23 mmol/L (ref 22–32)
CREATININE: 1.35 mg/dL — AB (ref 0.61–1.24)
Calcium: 8.1 mg/dL — ABNORMAL LOW (ref 8.9–10.3)
Chloride: 107 mmol/L (ref 101–111)
GFR calc Af Amer: >60 mL/min (ref >60)
GFR, EST NON AFRICAN AMERICAN: 56 mL/min — AB (ref >60)
GLUCOSE: 121 mg/dL — AB (ref 65–99)
POTASSIUM: 3.6 mmol/L (ref 3.5–5.1)
Sodium: 137 mmol/L (ref 135–145)

## 2016-07-18 LAB — HIV-1 GENOTYPE: HIV-1 GENOTYPE: DETECTED — AB

## 2016-07-18 MED ORDER — ANGIOPLASTY BOOK
Freq: Once | Status: AC
  Administered 2016-07-18: 06:00:00
  Filled 2016-07-18: qty 1

## 2016-07-18 MED ORDER — ASPIRIN 81 MG PO TBEC: 81.0000 mg | DELAYED_RELEASE_TABLET | Freq: Every day | ORAL | 12 refills | Status: DC

## 2016-07-20 ENCOUNTER — Other Ambulatory Visit: Payer: Self-pay | Admitting: Cardiology

## 2016-07-20 DIAGNOSIS — I739 Peripheral vascular disease, unspecified: Secondary | ICD-10-CM

## 2016-07-24 ENCOUNTER — Other Ambulatory Visit: Payer: Self-pay | Admitting: Cardiovascular Disease

## 2016-07-24 DIAGNOSIS — I739 Peripheral vascular disease, unspecified: Secondary | ICD-10-CM

## 2016-07-26 ENCOUNTER — Encounter: Payer: Self-pay | Admitting: Podiatry

## 2016-07-26 ENCOUNTER — Ambulatory Visit (INDEPENDENT_AMBULATORY_CARE_PROVIDER_SITE_OTHER): Payer: Medicaid Other | Admitting: Podiatry

## 2016-07-26 ENCOUNTER — Ambulatory Visit (INDEPENDENT_AMBULATORY_CARE_PROVIDER_SITE_OTHER): Payer: Medicaid Other

## 2016-07-26 VITALS — BP 161/81 | HR 65 | Resp 16

## 2016-07-26 DIAGNOSIS — M79671 Pain in right foot: Secondary | ICD-10-CM

## 2016-07-26 DIAGNOSIS — M778 Other enthesopathies, not elsewhere classified: Secondary | ICD-10-CM

## 2016-07-26 DIAGNOSIS — M779 Enthesopathy, unspecified: Secondary | ICD-10-CM

## 2016-07-26 DIAGNOSIS — M7751 Other enthesopathy of right foot: Secondary | ICD-10-CM

## 2016-07-26 DIAGNOSIS — R0989 Other specified symptoms and signs involving the circulatory and respiratory systems: Secondary | ICD-10-CM

## 2016-07-26 LAB — POCT ACTIVATED CLOTTING TIME
ACTIVATED CLOTTING TIME: 191 s
Activated Clotting Time: 230 seconds
Activated Clotting Time: 230 seconds
Activated Clotting Time: 180 seconds

## 2016-07-26 MED ORDER — HYDROCODONE-ACETAMINOPHEN 10-325 MG PO TABS: 1.0000 | ORAL_TABLET | Freq: Three times a day (TID) | ORAL | 0 refills | Status: DC | PRN

## 2016-07-26 NOTE — Progress Notes (Signed)
Subjective:    Patient ID: Kevin [name], male   DOB: 61 y.o.   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

## 2016-07-31 MED FILL — BIKTARVY 50-200-25 MG TABS: 50-200-25 | 30 days supply | Qty: 30 | Fill #1

## 2016-08-04 ENCOUNTER — Ambulatory Visit (HOSPITAL_COMMUNITY)
Admission: RE | Admit: 2016-08-04 | Discharge: 2016-08-04 | Disposition: A | Discharge status: Home / Self Care | Payer: Medicaid Other | Source: Ambulatory Visit | Attending: Internal Medicine | Admitting: Internal Medicine

## 2016-08-04 ENCOUNTER — Ambulatory Visit: Payer: Medicaid Other | Admitting: Cardiovascular Disease

## 2016-08-04 ENCOUNTER — Ambulatory Visit (HOSPITAL_COMMUNITY)
Admission: RE | Admit: 2016-08-04 | Payer: Medicaid Other | Source: Ambulatory Visit | Attending: Cardiology | Admitting: Cardiology

## 2016-08-04 DIAGNOSIS — I10 Essential (primary) hypertension: Secondary | ICD-10-CM | POA: Insufficient documentation

## 2016-08-04 DIAGNOSIS — J449 Chronic obstructive pulmonary disease, unspecified: Secondary | ICD-10-CM | POA: Diagnosis not present

## 2016-08-04 DIAGNOSIS — Z9862 Peripheral vascular angioplasty status: Secondary | ICD-10-CM | POA: Insufficient documentation

## 2016-08-04 DIAGNOSIS — I739 Peripheral vascular disease, unspecified: Secondary | ICD-10-CM | POA: Diagnosis not present

## 2016-08-04 DIAGNOSIS — E785 Hyperlipidemia, unspecified: Secondary | ICD-10-CM | POA: Diagnosis not present

## 2016-08-04 DIAGNOSIS — I70201 Unspecified atherosclerosis of native arteries of extremities, right leg: Secondary | ICD-10-CM | POA: Diagnosis not present

## 2016-08-10 ENCOUNTER — Ambulatory Visit: Payer: Medicaid Other | Admitting: Cardiovascular Disease

## 2016-08-10 ENCOUNTER — Encounter: Payer: Self-pay | Admitting: Internal Medicine

## 2016-08-10 ENCOUNTER — Ambulatory Visit (INDEPENDENT_AMBULATORY_CARE_PROVIDER_SITE_OTHER): Payer: Medicaid Other | Admitting: Internal Medicine

## 2016-08-10 VITALS — BP 139/80 | HR 78 | Temp 98.4°F | Ht 72.0 in | Wt 178.8 lb

## 2016-08-10 DIAGNOSIS — R238 Other skin changes: Secondary | ICD-10-CM | POA: Diagnosis present

## 2016-08-10 DIAGNOSIS — I10 Essential (primary) hypertension: Secondary | ICD-10-CM

## 2016-08-10 DIAGNOSIS — R233 Spontaneous ecchymoses: Secondary | ICD-10-CM

## 2016-08-10 MED ORDER — CARVEDILOL 12.5 MG PO TABS: 12.5000 mg | ORAL_TABLET | Freq: Two times a day (BID) | ORAL | 3 refills | Status: DC

## 2016-08-10 MED ORDER — CARVEDILOL 6.25 MG PO TABS: 9.3750 mg | ORAL_TABLET | Freq: Two times a day (BID) | ORAL | 5 refills | Status: DC

## 2016-08-10 NOTE — Progress Notes (Signed)
   Subjective:    Kevin [name] - 61 y.o. male MRN [id number]  Date of birth: January 11, 1956  HPI  Kevin [name] is here for concern for easy bruising.  Easy Bruising: Recently underwent arthrectomy for PVD in early June. Since then he has noticed he bruises more easily on all of his extremities without known precipitating trauma. Patient is on dual anti-platelet therapy with ASA and Plavix since April 2018 after mid left SFA balloon arthrectomy followed by drug-eluting balloon and plasty. He had problems with easy bruising after procedure in April as well. Previously, this has not been an issue and he has no known coagulopathy disorders. No bleeding from gums, nose, or in bowel movements.   HTN: BP noted to be elevated today. Patient monitors his BP at home every morning and reports average numbers of 140-150/70. He reports compliance with medications. No chest pain, headaches, or vision changes.   -  reports that he has been smoking Cigarettes.  He has a 21.00 pack-year smoking history. He has never used smokeless tobacco. - Review of Systems: Per HPI. - Past Medical History: Patient Active Problem List   Diagnosis Date Noted  . Claudication in peripheral vascular disease ([place]) 06/05/2016  . PVD (peripheral vascular disease) ([place]) 06/04/2016  . Stage 3 chronic kidney disease   . Peripheral arterial disease ([place]) 05/09/2016  . Lipid screening 05/01/2016  . Acute on chronic diastolic CHF (congestive heart failure), NYHA class 3 ([place]) 03/24/2016  . OSA (obstructive sleep apnea) 03/24/2016  . Weakness 02/24/2016  . Uncontrolled hypertension 02/18/2016  . Leg pain 02/18/2016  . Sleepiness 02/18/2016  . 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 Dyspnea 02/08/2016  . Shortness of breath 02/07/2016  . PAF (paroxysmal atrial fibrillation) ([place]) 02/07/2016  . Tobacco abuse 02/07/2016  . Acute on chronic renal insufficiency 02/07/2016  . COPD (chronic obstructive pulmonary disease)  ([place]) 02/07/2016  . Hypertensive emergency 02/07/2016  . Blurred vision, bilateral   . HIV disease ([place]) 08/27/2004   - Medications: reviewed and updated   Objective:   Physical Exam BP (!) 160/80 (BP Location: Right Arm, Patient Position: Sitting, Cuff Size: Normal)   Pulse 80   Temp 98.4 F (36.9 C) (Oral)   Ht 6' (1.829 m)   Wt 178 lb 12.8 oz (81.1 kg)   SpO2 97%   BMI 24.25 kg/m  Gen: NAD, alert, cooperative with exam, well-appearing CV: RRR, good S1/S2, no murmur, no edema, capillary refill brisk  Resp: CTABL, no wheezes, non-labored Skin: mostly old appearing bruises on the arms and legs     Assessment & Plan:   1. Easy bruising Without other active bleeding on history. Will continue Plavix and ASA as patient has a strong reason to require dual anti-platelet regimen at present. Will obtain some labs to evaluate further.  - CBC - Protime-INR  2. Essential hypertension BP not at goal at OV or at home per history. Will increase Coreg from 9.375 BID to 12.5 mg BID. No red flags.  - carvedilol (COREG) 12.5 MG tablet; Take 1 tablet (12.5 mg total) by mouth 2 (two) times daily with a meal.  Dispense: 60 tablet; Refill: 3 -return in 1-2 weeks for RN BP check and continue to monitor daily at home  -f/u with cardiology as scheduled    Phill Myron, D.O. 08/10/2016, 1:47 PM PGY-2, [place]

## 2016-08-10 NOTE — Patient Instructions (Signed)
We have ordered some lab work today to check on blood counts with your easy bruising.   We will increase your Coreg (Carvedilol) to 12.5 mg twice per day. I have sent a new prescription into your pharmacy but you can also use the remaining pills you have and take two twice per day in place of 1.5 pills twice per day.   Please return in two weeks for a blood pressure check with the nurse.

## 2016-08-11 DIAGNOSIS — R238 Other skin changes: Secondary | ICD-10-CM | POA: Insufficient documentation

## 2016-08-11 DIAGNOSIS — R233 Spontaneous ecchymoses: Secondary | ICD-10-CM | POA: Insufficient documentation

## 2016-08-11 LAB — CBC
HEMATOCRIT: 30.1 % — AB (ref 37.5–51.0)
Hemoglobin: 10 g/dL — ABNORMAL LOW (ref 13.0–17.7)
MCH: 32.4 pg (ref 26.6–33.0)
MCHC: 33.2 g/dL (ref 31.5–35.7)
MCV: 97 fL (ref 79–97)
NRBC: 1 % — AB (ref 0–0)
PLATELETS: 289 10*3/uL (ref 150–379)
RBC: 3.09 x10E6/uL — AB (ref 4.14–5.80)
RDW: 16.4 % — ABNORMAL HIGH (ref 12.3–15.4)
WBC: 14.9 10*3/uL — ABNORMAL HIGH (ref 3.4–10.8)

## 2016-08-11 LAB — PROTIME-INR
INR: 1.1 (ref 0.8–1.2)
PROTHROMBIN TIME: 11.5 s (ref 9.1–12.0)

## 2016-08-14 ENCOUNTER — Other Ambulatory Visit: Payer: Self-pay | Admitting: Cardiovascular Disease

## 2016-08-14 DIAGNOSIS — I739 Peripheral vascular disease, unspecified: Secondary | ICD-10-CM

## 2016-08-17 ENCOUNTER — Ambulatory Visit (INDEPENDENT_AMBULATORY_CARE_PROVIDER_SITE_OTHER): Payer: Medicaid Other | Admitting: Infectious Disease

## 2016-08-17 ENCOUNTER — Other Ambulatory Visit (HOSPITAL_COMMUNITY)
Admission: RE | Admit: 2016-08-17 | Discharge: 2016-08-17 | Disposition: A | Discharge status: Home / Self Care | Payer: Medicaid Other | Source: Ambulatory Visit | Attending: Infectious Disease | Admitting: Infectious Disease

## 2016-08-17 ENCOUNTER — Encounter: Payer: Self-pay | Admitting: Infectious Disease

## 2016-08-17 VITALS — BP 164/85 | HR 58 | Temp 97.8°F | Ht 72.0 in | Wt 173.0 lb

## 2016-08-17 DIAGNOSIS — Z23 Encounter for immunization: Secondary | ICD-10-CM

## 2016-08-17 DIAGNOSIS — B20 Human immunodeficiency virus [HIV] disease: Secondary | ICD-10-CM | POA: Insufficient documentation

## 2016-08-17 DIAGNOSIS — N289 Disorder of kidney and ureter, unspecified: Secondary | ICD-10-CM

## 2016-08-17 DIAGNOSIS — N183 Chronic kidney disease, stage 3 unspecified: Secondary | ICD-10-CM

## 2016-08-17 DIAGNOSIS — Z720 Tobacco use: Secondary | ICD-10-CM | POA: Diagnosis not present

## 2016-08-17 DIAGNOSIS — N189 Chronic kidney disease, unspecified: Secondary | ICD-10-CM

## 2016-08-17 DIAGNOSIS — I5033 Acute on chronic diastolic (congestive) heart failure: Secondary | ICD-10-CM

## 2016-08-17 DIAGNOSIS — I739 Peripheral vascular disease, unspecified: Secondary | ICD-10-CM

## 2016-08-17 HISTORY — DX: Human immunodeficiency virus (HIV) disease: B20

## 2016-08-17 LAB — CBC WITH DIFFERENTIAL/PLATELET
BASOS ABS: 0 {cells}/uL (ref 0–200)
Basophils Relative: 0 %
EOS ABS: 0 {cells}/uL — AB (ref 15–500)
Eosinophils Relative: 0 %
HCT: 31.3 % — ABNORMAL LOW (ref 38.5–50.0)
Hemoglobin: 10.4 g/dL — ABNORMAL LOW (ref 13.2–17.1)
LYMPHS PCT: 16 %
Lymphs Abs: 1680 cells/uL (ref 850–3900)
MCH: 32.7 pg (ref 27.0–33.0)
MCHC: 33.2 g/dL (ref 32.0–36.0)
MCV: 98.4 fL (ref 80.0–100.0)
MONOS PCT: 7 %
MPV: 8.9 fL (ref 7.5–12.5)
Monocytes Absolute: 735 cells/uL (ref 200–950)
NEUTROS PCT: 77 %
Neutro Abs: 8085 cells/uL — ABNORMAL HIGH (ref 1500–7800)
PLATELETS: 207 10*3/uL (ref 140–400)
RBC: 3.18 MIL/uL — ABNORMAL LOW (ref 4.20–5.80)
RDW: 16.1 % — ABNORMAL HIGH (ref 11.0–15.0)
WBC: 10.5 10*3/uL (ref 3.8–10.8)

## 2016-08-17 LAB — COMPLETE METABOLIC PANEL WITH GFR
ALT: 14 U/L (ref 9–46)
AST: 14 U/L (ref 10–35)
Albumin: 3.7 g/dL (ref 3.6–5.1)
Alkaline Phosphatase: 39 U/L — ABNORMAL LOW (ref 40–115)
BILIRUBIN TOTAL: 0.4 mg/dL (ref 0.2–1.2)
BUN: 41 mg/dL — AB (ref 7–25)
CO2: 20 mmol/L (ref 20–31)
CREATININE: 1.95 mg/dL — AB (ref 0.70–1.33)
Calcium: 8.3 mg/dL — ABNORMAL LOW (ref 8.6–10.3)
Chloride: 109 mmol/L (ref 98–110)
GFR, Est African American: 43 mL/min — ABNORMAL LOW (ref >=60)
GFR, Est Non African American: 37 mL/min — ABNORMAL LOW (ref >=60)
GLUCOSE: 81 mg/dL (ref 65–99)
Potassium: 4.8 mmol/L (ref 3.5–5.3)
SODIUM: 135 mmol/L (ref 135–146)
TOTAL PROTEIN: 6.8 g/dL (ref 6.1–8.1)

## 2016-08-17 NOTE — Progress Notes (Signed)
Chief complaint: Follow-up for care for his HIV.   Subjective:    Patient ID: Kevin [name], male    DOB: [birth year], 61 y.o.   MRN: [id number]  HPI  Kevin [name] is a 61 year old after an man with multiple medical problems including coronary artery disease, COPD, smoking, paroxysmal atrial fibrillation chronic diastolic congestive heart failure, peripheral vascular disease, obstructive sleep apnea who had actually tested positive for HIV nearly 12 years ago in 2006 while he was incarcerated. Apparently per [employer] officers documentation the patient was on interested in engaging in care at that time when they visited him on several occasions. He apparently The diagnosis a secret in the interim has married his wife who came with him today and father child with her. She tested negative during her pregnancy with their child. We are retesting her today.   In any case he tested positive via prompting of the best practice alert for CDC recommendations of patient's 16-65 have not an HIV test in her system. This performed when he came in for catheterization and workup for his peripheral vascular disease.  I went over 3 regimens that we could start N/A "test and treat model were we do not have information on his genotype his genetics his hepatitis B coinfection status and he agreed to start [facility] which we have sent to [facility].   His genotype came back with wild type virus he did have hepatitis B coinfection bar load was 7444 count was 740. We started him on BIKTARVY and he claims he is being adherent with this medications. We will check his labs today. He is need of hep a B vaccination     Past Medical History:  Diagnosis Date  . Chronic diastolic CHF (congestive heart failure), NYHA class 3 ([place]) 01/2016  . Chronic lower back pain   . CKD (chronic kidney disease), stage III   . COPD (chronic obstructive pulmonary disease) ([place])   . Gout    "forearms, hands, ankles, feet"  (06/05/2016)  . Headache    "weekly" (06/05/2016)  . Heart murmur   . Hypertension   . OSA on CPAP   . PAD (peripheral artery disease) ([place])   . PAF (paroxysmal atrial fibrillation) ([place]) 01/2016    Past Surgical History:  Procedure Laterality Date  . LOWER EXTREMITY ANGIOGRAPHY N/A 07/17/2016   Procedure: Lower Extremity Angiography;  Surgeon: Lorretta Harp, MD;  Location: [place] [facility];  Service: Cardiovascular;  Laterality: N/A;  . LOWER EXTREMITY INTERVENTION N/A 06/05/2016   Procedure: Lower Extremity Intervention;  Surgeon: Lorretta Harp, MD;  Location: [place] [facility];  Service: Cardiovascular;  Laterality: N/A;  . PERIPHERAL VASCULAR ATHERECTOMY Right 07/17/2016   Procedure: Peripheral Vascular Atherectomy;  Surgeon: Lorretta Harp, MD;  Location: [place] [facility];  Service: Cardiovascular;  Laterality: Right;  SFA  . PERIPHERAL VASCULAR INTERVENTION  06/05/2016   Procedure: Peripheral Vascular Intervention;  Surgeon: Lorretta Harp, MD;  Location: [place] [facility];  Service: Cardiovascular;;  left SFA    Family History  Problem Relation Age of Onset  . High blood pressure Mother   . Lupus Mother       Social History   Social History  . Marital status: Divorced    Spouse name: N/A  . Number of children: N/A  . Years of education: N/A   Social History Main Topics  . Smoking status: Current Every Day Smoker    Packs/day: 0.50  Years: 42.00    Types: Cigarettes  . Smokeless tobacco: Never Used  . Alcohol use 1.2 oz/week    2 Cans of beer per week  . Drug use: No  . Sexual activity: Not Currently   Other Topics Concern  . None   Social History Narrative  . None    No Known Allergies   Current Outpatient Prescriptions:  .  aspirin 81 MG EC tablet, Take 1 tablet (81 mg total) by mouth daily., Disp: 30 tablet, Rfl: 12 .  bictegravir-emtricitabine-tenofovir AF (BIKTARVY) 50-200-25 MG TABS tablet, Take 1 tablet by mouth daily., Disp: 30  tablet, Rfl: 11 .  carvedilol (COREG) 12.5 MG tablet, Take 1 tablet (12.5 mg total) by mouth 2 (two) times daily with a meal., Disp: 60 tablet, Rfl: 3 .  clopidogrel (PLAVIX) 75 MG tablet, Take 1 tablet (75 mg total) by mouth daily with breakfast., Disp: 90 tablet, Rfl: 3 .  DULERA 100-5 MCG/ACT AERO, INHALE 2 PUFFS INTO THE LUNGS TWICE DAILY, Disp: 13 g, Rfl: 0 .  furosemide (LASIX) 40 MG tablet, TAKE 1 TABLET BY MOUTH DAILY, Disp: 30 tablet, Rfl: 11 .  hydrALAZINE (APRESOLINE) 50 MG tablet, TAKE 1 tablet (50 MG) EVERY 8 HOURS, Disp: 135 tablet, Rfl: 5 .  HYDROcodone-acetaminophen (NORCO) 10-325 MG tablet, Take 1 tablet by mouth daily as needed for pain., Disp: , Rfl: 0 .  HYDROcodone-acetaminophen (NORCO) 10-325 MG tablet, Take 1 tablet by mouth every 8 (eight) hours as needed., Disp: 20 tablet, Rfl: 0 .  isosorbide mononitrate (IMDUR) 30 MG 24 hr tablet, TAKE 1 TABLET BY MOUTH DAILY, Disp: 30 tablet, Rfl: 11 .  potassium chloride (K-DUR) 10 MEQ tablet, Take 2 tablets (20 mEq total) by mouth daily., Disp: 180 tablet, Rfl: 3 .  PROAIR HFA 108 (90 Base) MCG/ACT inhaler, INHALE 2 PUFFS INTO THE LUNGS EVERY 6 HOURS AS NEEDED FOR WHEEZING OR SHORTNESS OF BREATH, Disp: 8.5 g, Rfl: 0 .  SPIRIVA HANDIHALER 18 MCG inhalation capsule, INHALE CONTENTS OF 1 CAPSULE ONCE DAILY USING HANDIHALER, Disp: 30 capsule, Rfl: 0 .  traZODone (DESYREL) 100 MG tablet, Take 100 mg by mouth at bedtime as needed for sleep., Disp: , Rfl:  .  CARTIA XT 180 MG 24 hr capsule, TAKE ONE CAPSULE BY MOUTH DAILY, Disp: 30 capsule, Rfl: 11    Review of Systems  Constitutional: Negative for chills and fever.  Respiratory: Negative for cough, shortness of breath and wheezing.   Cardiovascular: Negative for chest pain, palpitations and leg swelling.  Gastrointestinal: Negative for abdominal pain.  Genitourinary: Negative for dysuria, flank pain and hematuria.  Musculoskeletal: Negative for back pain and myalgias.  Skin: Negative  for rash.  Neurological: Negative for dizziness and headaches.  Hematological: Bruises/bleeds easily.  Psychiatric/Behavioral: Negative for suicidal ideas.       Objective:   Physical Exam  Constitutional: He is oriented to person, place, and time. No distress.  HENT:  Head: Normocephalic and atraumatic.  Mouth/Throat: No oropharyngeal exudate.  Eyes: Conjunctivae and EOM are normal. No scleral icterus.  Neck: Normal range of motion. Neck supple.  Cardiovascular: Normal rate, regular rhythm and intact distal pulses.  Exam reveals no gallop and no friction rub.   No murmur heard. Pulmonary/Chest: Effort normal and breath sounds normal. No respiratory distress. He has no wheezes.  Abdominal: Soft. He exhibits no distension.  Musculoskeletal: He exhibits no edema or tenderness.  Neurological: He is alert and oriented to person, place, and time. He exhibits normal muscle  tone. Coordination normal.  Skin: Skin is warm and dry. No rash noted. He is not diaphoretic. No erythema. No pallor.  Psychiatric: Judgment and thought content normal. His affect is blunt.  He sat very far away from initially. He was agreeable to vaccinations and said he would do whatever I asked him to do. Initially he he seems in a bad mood when he was being checked in. He came by himself today.          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

## 2016-08-17 NOTE — Patient Instructions (Signed)
Hep A and B today  RN visit for Hep B #2 vaccine in ONE MONTH  Visit with [facility] in 2 months

## 2016-08-17 NOTE — Addendum Note (Signed)
Addended by: Dolan Amen D on: 08/17/2016 04:55 PM   Modules accepted: Orders

## 2016-08-18 DIAGNOSIS — Z23 Encounter for immunization: Secondary | ICD-10-CM | POA: Diagnosis not present

## 2016-08-18 LAB — URINE CYTOLOGY ANCILLARY ONLY
Chlamydia: NEGATIVE
NEISSERIA GONORRHEA: NEGATIVE

## 2016-08-18 LAB — RPR

## 2016-08-18 LAB — T-HELPER CELL (CD4) - (RCID CLINIC ONLY)
CD4 % Helper T Cell: 49 % (ref 33–55)
CD4 T Cell Abs: 910 /uL (ref 400–2700)

## 2016-08-18 NOTE — Addendum Note (Signed)
Addended by: Lorne Skeens D on: 08/18/2016 09:28 AM   Modules accepted: Orders

## 2016-08-18 NOTE — Addendum Note (Signed)
Addended by: Roma Kayser on: 08/18/2016 12:21 PM   Modules accepted: Orders

## 2016-08-18 NOTE — Addendum Note (Signed)
Addended by: Lorne Skeens D on: 08/18/2016 09:24 AM   Modules accepted: Orders

## 2016-08-21 ENCOUNTER — Encounter (HOSPITAL_COMMUNITY): Payer: Self-pay | Admitting: Emergency Medicine

## 2016-08-21 ENCOUNTER — Ambulatory Visit (HOSPITAL_COMMUNITY)
Admission: EM | Admit: 2016-08-21 | Discharge: 2016-08-21 | Disposition: A | Discharge status: Home / Self Care | Payer: Medicaid Other | Attending: Internal Medicine | Admitting: Internal Medicine

## 2016-08-21 ENCOUNTER — Telehealth: Payer: Self-pay | Admitting: *Deleted

## 2016-08-21 ENCOUNTER — Ambulatory Visit (INDEPENDENT_AMBULATORY_CARE_PROVIDER_SITE_OTHER): Payer: Medicaid Other

## 2016-08-21 DIAGNOSIS — I739 Peripheral vascular disease, unspecified: Secondary | ICD-10-CM | POA: Insufficient documentation

## 2016-08-21 DIAGNOSIS — B20 Human immunodeficiency virus [HIV] disease: Secondary | ICD-10-CM | POA: Diagnosis not present

## 2016-08-21 DIAGNOSIS — Z8249 Family history of ischemic heart disease and other diseases of the circulatory system: Secondary | ICD-10-CM | POA: Insufficient documentation

## 2016-08-21 DIAGNOSIS — J449 Chronic obstructive pulmonary disease, unspecified: Secondary | ICD-10-CM | POA: Diagnosis not present

## 2016-08-21 DIAGNOSIS — N183 Chronic kidney disease, stage 3 (moderate): Secondary | ICD-10-CM | POA: Insufficient documentation

## 2016-08-21 DIAGNOSIS — Z7982 Long term (current) use of aspirin: Secondary | ICD-10-CM | POA: Insufficient documentation

## 2016-08-21 DIAGNOSIS — G4733 Obstructive sleep apnea (adult) (pediatric): Secondary | ICD-10-CM | POA: Diagnosis not present

## 2016-08-21 DIAGNOSIS — Z79899 Other long term (current) drug therapy: Secondary | ICD-10-CM | POA: Diagnosis not present

## 2016-08-21 DIAGNOSIS — I130 Hypertensive heart and chronic kidney disease with heart failure and stage 1 through stage 4 chronic kidney disease, or unspecified chronic kidney disease: Secondary | ICD-10-CM | POA: Insufficient documentation

## 2016-08-21 DIAGNOSIS — L03011 Cellulitis of right finger: Secondary | ICD-10-CM

## 2016-08-21 DIAGNOSIS — I480 Paroxysmal atrial fibrillation: Secondary | ICD-10-CM | POA: Diagnosis not present

## 2016-08-21 DIAGNOSIS — M79644 Pain in right finger(s): Secondary | ICD-10-CM | POA: Diagnosis present

## 2016-08-21 DIAGNOSIS — I5032 Chronic diastolic (congestive) heart failure: Secondary | ICD-10-CM | POA: Diagnosis not present

## 2016-08-21 DIAGNOSIS — F17210 Nicotine dependence, cigarettes, uncomplicated: Secondary | ICD-10-CM | POA: Diagnosis not present

## 2016-08-21 MED ORDER — LIDOCAINE-EPINEPHRINE-TETRACAINE (LET) SOLUTION
NASAL | Status: AC
  Filled 2016-08-21: qty 3

## 2016-08-21 MED ORDER — AMOXICILLIN-POT CLAVULANATE 875-125 MG PO TABS: 1.0000 | ORAL_TABLET | Freq: Two times a day (BID) | ORAL | 0 refills | Status: DC

## 2016-08-21 MED ORDER — LIDOCAINE-EPINEPHRINE-TETRACAINE (LET) SOLUTION
3.0000 mL | Freq: Once | NASAL | Status: AC
  Administered 2016-08-21: 15:00:00 3 mL via TOPICAL

## 2016-08-21 NOTE — Telephone Encounter (Signed)
Patient is currently admitted. Kevin [name]

## 2016-08-21 NOTE — Telephone Encounter (Signed)
-----   Message from Truman Hayward, MD sent at 08/18/2016  8:35 AM EDT ----- pts creatinine is up to 1.95. Should consider holding lasix and following up with PCP vs Cardiology. The BIKTARVY would NOT cause this much change in serum creatine and is "kidney friendly" regimen with TAF

## 2016-08-21 NOTE — ED Provider Notes (Signed)
CSN: [id number]     Arrival date & time 08/21/16  1423 History   None    Chief Complaint  Patient presents with  . Hand Pain   (Consider location/radiation/quality/duration/timing/severity/associated sxs/prior Treatment) 61 year old male presents to clinic with a chief complaint of pain and swelling to the distal portion of his right ring finger for 3 days. Denies history of trauma to the area, or other complaints. Denies fever, chills, nausea, vomiting, he is right handed, he is able to move his finger freely. He does have a past medical history of AIDS, chronic kidney disease, COPD, heart murmur, and hypertension.   The history is provided by the patient.  Hand Pain     Past Medical History:  Diagnosis Date  . AIDS (acquired immune deficiency syndrome) ([place]) 08/17/2016  . Chronic diastolic CHF (congestive heart failure), NYHA class 3 ([place]) 01/2016  . Chronic lower back pain   . CKD (chronic kidney disease), stage III   . COPD (chronic obstructive pulmonary disease) ([place])   . Gout    "forearms, hands, ankles, feet" (06/05/2016)  . Headache    "weekly" (06/05/2016)  . Heart murmur   . Hypertension   . OSA on CPAP   . PAD (peripheral artery disease) ([place])   . PAF (paroxysmal atrial fibrillation) ([place]) 01/2016   Past Surgical History:  Procedure Laterality Date  . LOWER EXTREMITY ANGIOGRAPHY N/A 07/17/2016   Procedure: Lower Extremity Angiography;  Surgeon: Lorretta Harp, MD;  Location: [place] [facility];  Service: Cardiovascular;  Laterality: N/A;  . LOWER EXTREMITY INTERVENTION N/A 06/05/2016   Procedure: Lower Extremity Intervention;  Surgeon: Lorretta Harp, MD;  Location: [place] [facility];  Service: Cardiovascular;  Laterality: N/A;  . PERIPHERAL VASCULAR ATHERECTOMY Right 07/17/2016   Procedure: Peripheral Vascular Atherectomy;  Surgeon: Lorretta Harp, MD;  Location: [place] [facility];  Service: Cardiovascular;  Laterality: Right;  SFA  . PERIPHERAL VASCULAR  INTERVENTION  06/05/2016   Procedure: Peripheral Vascular Intervention;  Surgeon: Lorretta Harp, MD;  Location: [place] [facility];  Service: Cardiovascular;;  left SFA   Family History  Problem Relation Age of Onset  . High blood pressure Mother   . Lupus Mother    Social History  Substance Use Topics  . Smoking status: Current Every Day Smoker    Packs/day: 0.50    Years: 42.00    Types: Cigarettes  . Smokeless tobacco: Never Used  . Alcohol use 1.2 oz/week    2 Cans of beer per week    Review of Systems  Allergies  Patient has no known allergies.  Home Medications   Prior to Admission medications   Medication Sig Start Date End Date Taking? Authorizing Provider  amoxicillin-clavulanate (AUGMENTIN) 875-125 MG tablet Take 1 tablet by mouth 2 (two) times daily. 08/21/16   Barnet [place], NP  aspirin 81 MG EC tablet Take 1 tablet (81 mg total) by mouth daily. 07/18/16   Cheryln Manly, NP  bictegravir-emtricitabine-tenofovir AF (BIKTARVY) 50-200-25 MG TABS tablet Take 1 tablet by mouth daily. 07/03/16   Truman Hayward, MD  CARTIA XT 180 MG 24 hr capsule TAKE ONE CAPSULE BY MOUTH DAILY 03/13/16   Hilty, Nadean Corwin, MD  carvedilol (COREG) 12.5 MG tablet Take 1 tablet (12.5 mg total) by mouth 2 (two) times daily with a meal. 08/10/16   [name], DO  clopidogrel (PLAVIX) 75 MG tablet Take 1 tablet (75 mg total) by mouth daily with breakfast. 06/07/16  Ahmed Prima, [place], PA-C  DULERA 100-5 MCG/ACT AERO INHALE 2 PUFFS INTO THE LUNGS TWICE DAILY 06/29/16   [name], DO  furosemide (LASIX) 40 MG tablet TAKE 1 TABLET BY MOUTH DAILY 03/13/16   Hilty, Nadean Corwin, MD  hydrALAZINE (APRESOLINE) 50 MG tablet TAKE 1 tablet (50 MG) EVERY 8 HOURS 02/24/16   Hensel, Jamal Collin, MD  HYDROcodone-acetaminophen (NORCO) 10-325 MG tablet Take 1 tablet by mouth daily as needed for pain. 07/04/16   [provider]  HYDROcodone-acetaminophen (NORCO) 10-325 MG  tablet Take 1 tablet by mouth every 8 (eight) hours as needed. 07/26/16   Wallene Huh, DPM  isosorbide mononitrate (IMDUR) 30 MG 24 hr tablet TAKE 1 TABLET BY MOUTH DAILY 03/13/16   Hilty, Nadean Corwin, MD  potassium chloride (K-DUR) 10 MEQ tablet Take 2 tablets (20 mEq total) by mouth daily. 03/21/16   [name], MD  PROAIR HFA 108 [phone] Base) MCG/ACT inhaler INHALE 2 PUFFS INTO THE LUNGS EVERY 6 HOURS AS NEEDED FOR WHEEZING OR SHORTNESS OF BREATH 03/15/16   [name], DO  SPIRIVA HANDIHALER 18 MCG inhalation capsule INHALE CONTENTS OF 1 CAPSULE ONCE DAILY USING HANDIHALER 06/29/16   [name], DO  traZODone (DESYREL) 100 MG tablet Take 100 mg by mouth at bedtime as needed for sleep.    [provider]   Meds Ordered and Administered this Visit   Medications  lidocaine-EPINEPHrine-tetracaine (LET) solution (3 mLs Topical Given 08/21/16 1517)    BP 136/76 (BP Location: Left Arm)   Pulse 72   Temp 98.5 F (36.9 C) (Oral)   Resp 18   SpO2 100%  No data found.   Physical Exam  Constitutional: He is oriented to person, place, and time. He appears well-developed and well-nourished. No distress.  HENT:  Head: Normocephalic and atraumatic.  Right Ear: External ear normal.  Left Ear: External ear normal.  Eyes: Conjunctivae are normal.  Cardiovascular: Normal rate and regular rhythm.   Pulmonary/Chest: Effort normal and breath sounds normal.  Neurological: He is alert and oriented to person, place, and time.  Skin: Skin is warm and dry. Capillary refill takes less than 2 seconds. He is not diaphoretic.  Paronychia of the medial aspect of the right ring finger  Psychiatric: He has a normal mood and affect. His behavior is normal.  Nursing note and vitals reviewed.   Urgent Care Course     .Marland KitchenIncision and Drainage Date/Time: 08/21/2016 4:12 PM Performed by: Barnet [place] Authorized by: Sherlene Shams   Consent:    Consent obtained:   Verbal   Consent given by:  Patient   Risks discussed:  Bleeding, incomplete drainage, pain and infection Location:    Indications for incision and drainage: Paronychia.   Size:  1cm   Location:  Upper extremity   Upper extremity location:  Hand   Hand location:  R hand Pre-procedure details:    Skin preparation:  Betadine Anesthesia (see MAR for exact dosages):    Anesthesia method:  Topical application   Topical anesthetic:  LET Procedure type:    Complexity:  Simple Procedure details:    Needle aspiration: no     Incision types:  Stab incision   Incision depth:  Dermal   Scalpel blade:  11   Wound management:  Probed and deloculated   Drainage:  Purulent   Drainage amount:  Moderate   Wound treatment:  Wound left open   Packing materials:  None Post-procedure details:  Patient tolerance of procedure:  Tolerated well, no immediate complications   (including critical care time)  Labs Review Labs Reviewed  AEROBIC/ANAEROBIC CULTURE (SURGICAL/DEEP WOUND)    Imaging Review Dg Finger Ring Right  Result Date: 08/21/2016 CLINICAL DATA:  Pain and swelling to his 4th finger on his right hand x 3 days. Swelling across whole 4th digit, swelling worse just on proximal part of distal phalange discoloration around DiP joint. Pt has a laceration on anterior surface of PiP joint. Pain from PiP joint to tuft described as a constant throbbing pain. Concern for osteomyelitis. EXAM: RIGHT RING FINGER 2+V COMPARISON:  None. FINDINGS: There is no evidence of fracture or dislocation. There is no evidence of arthropathy or other focal bone abnormality. Soft tissue swelling dorsal to the distal interphalangeal joint. No radiodense foreign body or subcutaneous gas. IMPRESSION: Dorsal soft tissue swelling without bone abnormality or radiodense foreign body. Electronically Signed   By: Lucrezia Europe M.D.   On: 08/21/2016 15:12      MDM   1. Paronychia of right ring finger    I&D performed in  clinic, starting prophylactically on Augmentin, cultures obtained and sent for further testing. Follow-up with hand for further evaluation and management or return to clinic as needed for wound check in 2 days.    Barnet [place], NP 08/21/16 1614

## 2016-08-21 NOTE — Discharge Instructions (Signed)
You had a paronychia of your right index finger. This is been opened and drained in clinic. I'm starting you prophylactically on Augmentin, take one tablet twice a day for infection. Cultures have been taken and sent to the lab for further testing. After further review of health records, I'm unable to prescribe any additional medicine for pain, continue your hydrocodone as prescribed by your regular provider.

## 2016-08-21 NOTE — Telephone Encounter (Signed)
Ok thanks [employer]

## 2016-08-21 NOTE — ED Triage Notes (Signed)
The patient presented to the [facility] with a complaint of pain and swelling to his 4th finger on his right hand x 3 days.

## 2016-08-23 MED FILL — BIKTARVY 50-200-25 MG TABS: 50-200-25 | 30 days supply | Qty: 30 | Fill #2

## 2016-08-24 ENCOUNTER — Ambulatory Visit (INDEPENDENT_AMBULATORY_CARE_PROVIDER_SITE_OTHER): Payer: Medicaid Other | Admitting: *Deleted

## 2016-08-24 VITALS — BP 150/90

## 2016-08-24 DIAGNOSIS — Z0130 Encounter for examination of blood pressure without abnormal findings: Secondary | ICD-10-CM

## 2016-08-24 LAB — HIV RNA, RTPCR W/R GT (RTI, PI,INT)
HIV-1 RNA, QN PCR: <1.3 Log copies/mL
HIV-1 RNA, QN PCR: <20 copies/mL

## 2016-08-24 NOTE — Progress Notes (Signed)
Patient here today for BP check.    BP today is 150/90.  Checked BP in left arm with regular cuff.  Symptoms present: none.  Patient last took BP med yesterday.    After speaking with the patient I found out that he has only been taking the 6.25mg  coreg BID.  He was unaware that MD sent in a new script for 12.5mg  that he was supposed to take BID.  Explained that he essentially was taking only 12.5mg  the whole day instead of 25mg  a day Dr. Juleen China rx'd.  He expresses his understanding and will pick up the Rx today ([facility] [address]-he called and had it switched there).  He will take the new medication and trash the other one so that he does not get confused again.  Discussed with Dr. Gwendlyn Deutscher who agrees and pt will return to see Dr. Juleen China in 5 days (08/29/16)    Routed note to PCP.    Fleeger, Salome Spotted, CMA

## 2016-08-26 LAB — AEROBIC/ANAEROBIC CULTURE (SURGICAL/DEEP WOUND)

## 2016-08-26 LAB — AEROBIC/ANAEROBIC CULTURE W GRAM STAIN (SURGICAL/DEEP WOUND)

## 2016-08-27 ENCOUNTER — Encounter: Payer: Self-pay | Admitting: Cardiovascular Disease

## 2016-08-29 ENCOUNTER — Ambulatory Visit: Payer: Medicaid Other | Admitting: Internal Medicine

## 2016-09-04 ENCOUNTER — Other Ambulatory Visit: Payer: Self-pay | Admitting: Internal Medicine

## 2016-09-15 MED FILL — BIKTARVY 50-200-25 MG TABS: 50-200-25 | 30 days supply | Qty: 30 | Fill #3

## 2016-09-18 ENCOUNTER — Ambulatory Visit (INDEPENDENT_AMBULATORY_CARE_PROVIDER_SITE_OTHER): Payer: Medicaid Other | Admitting: *Deleted

## 2016-09-18 DIAGNOSIS — Z23 Encounter for immunization: Secondary | ICD-10-CM

## 2016-10-17 MED FILL — BIKTARVY 50-200-25 MG TABS: 50-200-25 | 30 days supply | Qty: 30 | Fill #4

## 2016-10-19 ENCOUNTER — Telehealth: Payer: Self-pay | Admitting: Internal Medicine

## 2016-10-19 ENCOUNTER — Encounter: Payer: Self-pay | Admitting: Internal Medicine

## 2016-10-19 ENCOUNTER — Ambulatory Visit (INDEPENDENT_AMBULATORY_CARE_PROVIDER_SITE_OTHER): Payer: Medicaid Other | Admitting: Internal Medicine

## 2016-10-19 VITALS — BP 122/78 | HR 48 | Ht 72.0 in | Wt 167.4 lb

## 2016-10-19 DIAGNOSIS — J439 Emphysema, unspecified: Secondary | ICD-10-CM | POA: Diagnosis not present

## 2016-10-19 DIAGNOSIS — I2510 Atherosclerotic heart disease of native coronary artery without angina pectoris: Secondary | ICD-10-CM | POA: Insufficient documentation

## 2016-10-19 DIAGNOSIS — I25110 Atherosclerotic heart disease of native coronary artery with unstable angina pectoris: Secondary | ICD-10-CM | POA: Diagnosis not present

## 2016-10-19 DIAGNOSIS — I739 Peripheral vascular disease, unspecified: Secondary | ICD-10-CM | POA: Diagnosis not present

## 2016-10-19 DIAGNOSIS — B20 Human immunodeficiency virus [HIV] disease: Secondary | ICD-10-CM | POA: Diagnosis not present

## 2016-10-19 NOTE — Telephone Encounter (Signed)
New message  Pt wife call requesting to speak with Dr. Debara Pickett about getting a debrief of pts appt for today. Please call back to discuss

## 2016-10-19 NOTE — Progress Notes (Signed)
OFFICE NOTE  Chief Complaint:  Persistent shortness of breath  Primary Care Physician: [name], DO  HPI:  Kevin [name] is a 61 y.o. male who I saw in the hospital over Christmas. He has history of hypertension and had not seen his primary care provider for some time because they've not been able to get an appointment. He reports 2 weeks of worsening dyspnea on exertion, headache and new visual changes. He was found to have hypertensive emergency with blood pressures of [id number]/992 systolic. He's also noted have an elevated creatinine. He denies any chest pain. On arrival is noted to be in A. fib with rate control. He has significant LVH on EKG as well as T-wave inversions in the inferior leads. Initial troponin was 0.02. Chest x-ray shows peribronchial thickening and hyperinflation concerning for possible COPD. BNP was mildly elevated at 208. He was given Lasix and has diuresed a considerable amount since he was in the emergency department.   03/24/2016  Since discharge he saw [name], PA-C in follow-up. He said a number of other medical issues including visual changes and headaches for which she underwent a temporal artery biopsy that was negative. He also is on a steroid taper and noted that when his steroids were tapered off he started having muscle pains and weakness. He went back on the steroids and his symptoms improved. Blood pressure was still elevated today 160/90. He reports shortness of breath which hasn't improved despite using inhalers. He does have some COPD but is on [place]. Echo showed EF of 65-70%. He did have paroxysmal atrial fibrillation with a CHADSVASC score of 1. EKG today shows sinus rhythm with minimal voltage criteria for LVH. He is on aspirin for anticoagulation. He has not had an ischemia evaluation.  05/01/2016  Kevin [name] returns today for follow-up. He reports that he has been a little more short of breath recently. He is on a  number of inhalers and last night was more congested. His blood pressure looks better today and a recheck was 130/94. He denies any recurrent atrial fibrillation. I received lab work from his nephrologist [name], which showed creatinine of 1.41. He is not on enalapril due to possible renal artery stenosis. He does have renal Dopplers which are pending. In addition he's had some leg weakness which was both at rest and with exertion. He recently saw a podiatrist who diagnosed him with plantar fasciitis, but did order peripheral artery Dopplers which will be performed today in the office. Given his long-standing smoking history, he could have either PAD or renal artery stenosis for sure. I also had ordered a stress test on him. This demonstrated a fixed inferior defect which was suggestive of possible scar with mildly decreased LVEF of 47%. He is not having any active chest pain. This could represent a prior infarct, but he does not have any recollection of that. It suggests he does have significant underlying coronary disease. At this point I would recommend medical therapy given his chronic kidney disease as he would be at increased risk for contrast nephropathy with heart catheterization. Finally, he did recently have a sleep study which was abnormal and it was suggested that he be fitted with BiPAP therapy. That was about 3 weeks ago and he is not yet received equipment. I spoke with [name] nurse [name] she was involved in contacting the patient to arrange for his equipment.  10/19/2016  Kevin [name] was seen today in follow-up. He  reports persistently worsening shortness of breath. He says he can no longer do normal activities. He used to works fairly regularly however wears upper quickly. He says he now takes in more than 6 hours to mow his lawn. Recently he underwent peripheral angiography by Dr. [name] and was found to have an SFA occlusion which was stented. He says he's had improvement in his leg  pain. He required preadmission and hydration due to chronic kidney disease. He also has a history of HIV disease and was recently reestablished with the infectious disease clinic. Recently his viral load is undetectable. Stress testing in February 2018 showed an LVEF of 47% with a medium-size moderate severity inferior defect suggestive of inferior MI. He is not aware of prior heart attack. He also has some chronic lung disease likely COPD but is on a number of inhalers and reports this is not helping his shortness of breath. He also says his fatigue. He describes the chest pain as well. Despite the findings on the stress test, suspect this is representing progressive coronary artery disease.  PMHx:  Past Medical History:  Diagnosis Date  . AIDS (acquired immune deficiency syndrome) ([place]) 08/17/2016  . Chronic diastolic CHF (congestive heart failure), NYHA class 3 ([place]) 01/2016  . Chronic lower back pain   . CKD (chronic kidney disease), stage III   . COPD (chronic obstructive pulmonary disease) ([place])   . Gout    "forearms, hands, ankles, feet" (06/05/2016)  . Headache    "weekly" (06/05/2016)  . Heart murmur   . Hypertension   . OSA on CPAP   . PAD (peripheral artery disease) ([place])   . PAF (paroxysmal atrial fibrillation) ([place]) 01/2016    Past Surgical History:  Procedure Laterality Date  . LOWER EXTREMITY ANGIOGRAPHY N/A 07/17/2016   Procedure: Lower Extremity Angiography;  Surgeon: Lorretta Harp, MD;  Location: [facility];  Service: Cardiovascular;  Laterality: N/A;  . LOWER EXTREMITY INTERVENTION N/A 06/05/2016   Procedure: Lower Extremity Intervention;  Surgeon: Lorretta Harp, MD;  Location: [facility];  Service: Cardiovascular;  Laterality: N/A;  . PERIPHERAL VASCULAR ATHERECTOMY Right 07/17/2016   Procedure: Peripheral Vascular Atherectomy;  Surgeon: Lorretta Harp, MD;  Location: [facility];  Service: Cardiovascular;  Laterality: Right;  SFA  . PERIPHERAL  VASCULAR INTERVENTION  06/05/2016   Procedure: Peripheral Vascular Intervention;  Surgeon: Lorretta Harp, MD;  Location: [facility];  Service: Cardiovascular;;  left SFA    FAMHx:  Family History  Problem Relation Age of Onset  . High blood pressure Mother   . Lupus Mother     SOCHx:   reports that he has been smoking Cigarettes.  He has a 21.00 pack-year smoking history. He has never used smokeless tobacco. He reports that he drinks about 1.2 oz of alcohol per week . He reports that he does not use drugs.  ALLERGIES:  No Known Allergies  ROS: Pertinent items noted in HPI and remainder of comprehensive ROS otherwise negative.  HOME MEDS: Current Outpatient Prescriptions on File Prior to Visit  Medication Sig Dispense Refill  . aspirin 81 MG EC tablet Take 1 tablet (81 mg total) by mouth daily. 30 tablet 12  . bictegravir-emtricitabine-tenofovir AF (BIKTARVY) 50-200-25 MG TABS tablet Take 1 tablet by mouth daily. 30 tablet 11  . CARTIA XT 180 MG 24 hr capsule TAKE ONE CAPSULE BY MOUTH DAILY 30 capsule 11  . carvedilol (COREG) 12.5 MG tablet Take 1 tablet (  12.5 mg total) by mouth 2 (two) times daily with a meal. 60 tablet 3  . clopidogrel (PLAVIX) 75 MG tablet Take 1 tablet (75 mg total) by mouth daily with breakfast. 90 tablet 3  . DULERA 100-5 MCG/ACT AERO INHALE 2 PUFFS INTO THE LUNGS TWICE DAILY 13 g 0  . furosemide (LASIX) 40 MG tablet TAKE 1 TABLET BY MOUTH DAILY 30 tablet 11  . hydrALAZINE (APRESOLINE) 50 MG tablet TAKE 1 tablet (50 MG) EVERY 8 HOURS 135 tablet 5  . HYDROcodone-acetaminophen (NORCO) 10-325 MG tablet Take 1 tablet by mouth every 8 (eight) hours as needed. 20 tablet 0  . isosorbide mononitrate (IMDUR) 30 MG 24 hr tablet TAKE 1 TABLET BY MOUTH DAILY 30 tablet 11  . potassium chloride (K-DUR) 10 MEQ tablet Take 2 tablets (20 mEq total) by mouth daily. 180 tablet 3  . PROAIR HFA 108 (90 Base) MCG/ACT inhaler INHALE 2 PUFFS INTO THE LUNGS EVERY 6 HOURS AS  NEEDED FOR WHEEZING OR SHORTNESS OF BREATH 8.5 g 0  . SPIRIVA HANDIHALER 18 MCG inhalation capsule INHALE CONTENTS OF 1 CAPSULE ONCE DAILY USING HANDIHALER 30 capsule 5  . traZODone (DESYREL) 100 MG tablet Take 100 mg by mouth at bedtime as needed for sleep.    . [DISCONTINUED] hydrochlorothiazide (HYDRODIURIL) 25 MG tablet Take 1 tablet (25 mg total) by mouth daily. (Patient not taking: Reported on 11/24/2014) 10 tablet 0   No current facility-administered medications on file prior to visit.     LABS/IMAGING: No results found for this or any previous visit (from the past 48 hour(s)). No results found.  WEIGHTS: Wt Readings from Last 3 Encounters:  10/19/16 167 lb 6.4 oz (75.9 kg)  08/17/16 173 lb (78.5 kg)  08/10/16 178 lb 12.8 oz (81.1 kg)    VITALS: BP 122/78 (BP Location: Left Arm, Cuff Size: Normal)   Pulse (!) 48   Ht 6' (1.829 m)   Wt 167 lb 6.4 oz (75.9 kg)   BMI 22.70 kg/m   EXAM: General appearance: alert and no distress Neck: no carotid bruit, no JVD and thyroid not enlarged, symmetric, no tenderness/mass/nodules Lungs: diminished breath sounds bilaterally Heart: Regular bradycardia Abdomen: soft, non-tender; bowel sounds normal; no masses,  no organomegaly Extremities: extremities normal, atraumatic, no cyanosis or edema Pulses: 2+ and symmetric Skin: Skin color, texture, turgor normal. No rashes or lesions Neurologic: Grossly normal Psych: Pleasant  EKG: Sinus bradycardia first degree AV block at 48, inferior T-wave changes suggestive of ischemia-personally reviewed  ASSESSMENT: 1. Persistent dyspnea on exertion - mildly decreased LVEF 47% on NST with fixed inferior defect. ?scar 2. PAD status post left SFA intervention (07/2016) 3. Hypertension 4. Probable COPD 5. PAF-CHADSVASC score 1 6. Chronic neuropathic pain and weakness 7. Newly diagnosed OSA - set for BIPAP 8. HIV on HAART - undetectable viral load 9. CKD 3  PLAN: 1.   Mr. Plaskett has had  progressive dyspnea and exertion which seems out of proportion from his stress testing. He had a stress test which showed a fixed inferior defect suggestive of scar. He successfully underwent peripheral angioplasty of the left SFA. He reports improvement in his leg pain. This suggests a high likelihood of coronary artery disease. Despite his nonischemic stress test, he does have a reduced LV function and progressive dyspnea on exertion, chest tightness and fatigue to the extent that he can barely do any activities. He is no longer able to work and it takes him hours to [employer] his lawn. He  is on appropriate medications for COPD and does continue to smoke about 5 cigarettes a day. I'm concerned about multivessel disease that may been missed on his stress test. Despite the fact that he has chronic kidney disease, I think he would be acceptable for cardiac catheterization with minimal dye load. He was previously pre-admitted and hydrated and ultimately tolerated lower extremity angiography without significant worsening renal failure. We did discuss the risks, benefits and alternatives to catheterization today and is agreeable to proceed. We'll likely preadmitted him for hydration and subsequent catheterization the next day. Follow-up with me afterwards.  [name], MD, [facility] Attending Cardiologist [place] 10/19/2016, 9:16 AM

## 2016-10-19 NOTE — Telephone Encounter (Signed)
Call placed back to this number by the nurse as it was inadvertantly put on a message for a patient of Dr. Olin Pia. Per brief discussion with Sherlyn Hay, she had to work today and was wanting to get a "run down" of the patient's visit today. I advised I would leave the message for the [facility] office to call her back.  She is agreeable.

## 2016-10-19 NOTE — Telephone Encounter (Signed)
S/w [name] (DPR) she is concerned because pt does not know if he need to stop any medications before Cath procedure. Discussed this with nurse, she states that he does not need to stop anything as he is being admitted Sunday for pre-admission on 10-22-16 and they will do everything at that time. [name] notified as nurse comment above. She will await pre-admit call. She states she would like to know if we can tell admitting to leave a detailed message when calling her. S/w crystal she was unable to answer question told to call bed control. [name] at bed control he states that they will be calling on Sunday an will only leave vm to say call back and she will need to call back if unavailable. Left detailed message-Kevin [name]

## 2016-10-19 NOTE — Patient Instructions (Signed)
Dr Debara Pickett recommends that you have a cardiac catheterization on 10/23/16 at 7:30a. Cardiac catheterization is used to diagnose and/or treat various heart conditions. Doctors may recommend this procedure for a number of different reasons. The most common reason is to evaluate chest pain. Chest pain can be a symptom of coronary artery disease (CAD), and cardiac catheterization can show whether plaque is narrowing or blocking your heart's arteries. This procedure is also used to evaluate the valves, as well as measure the blood flow and oxygen levels in different parts of your heart. For further information please visit [email or web address]. Please follow instruction sheet, as given.   You will be admitted to [facility] on Sunday, 10/22/16. They will call you when they have a bed available. Please wait for their call.

## 2016-10-22 ENCOUNTER — Encounter (HOSPITAL_COMMUNITY): Payer: Self-pay | Admitting: Cardiology

## 2016-10-22 ENCOUNTER — Observation Stay (HOSPITAL_COMMUNITY)
Admission: AD | Admit: 2016-10-22 | Discharge: 2016-10-23 | Disposition: A | Discharge status: Home / Self Care | Payer: Medicaid Other | Source: Ambulatory Visit | Attending: Internal Medicine | Admitting: Internal Medicine

## 2016-10-22 DIAGNOSIS — I25110 Atherosclerotic heart disease of native coronary artery with unstable angina pectoris: Secondary | ICD-10-CM | POA: Diagnosis not present

## 2016-10-22 DIAGNOSIS — J449 Chronic obstructive pulmonary disease, unspecified: Secondary | ICD-10-CM | POA: Insufficient documentation

## 2016-10-22 DIAGNOSIS — I10 Essential (primary) hypertension: Secondary | ICD-10-CM | POA: Diagnosis not present

## 2016-10-22 DIAGNOSIS — I5032 Chronic diastolic (congestive) heart failure: Secondary | ICD-10-CM | POA: Insufficient documentation

## 2016-10-22 DIAGNOSIS — I480 Paroxysmal atrial fibrillation: Secondary | ICD-10-CM | POA: Diagnosis not present

## 2016-10-22 DIAGNOSIS — B20 Human immunodeficiency virus [HIV] disease: Secondary | ICD-10-CM | POA: Diagnosis not present

## 2016-10-22 DIAGNOSIS — N183 Chronic kidney disease, stage 3 unspecified: Secondary | ICD-10-CM | POA: Diagnosis present

## 2016-10-22 DIAGNOSIS — G4733 Obstructive sleep apnea (adult) (pediatric): Secondary | ICD-10-CM | POA: Insufficient documentation

## 2016-10-22 DIAGNOSIS — N1832 Chronic kidney disease, stage 3b: Secondary | ICD-10-CM | POA: Diagnosis present

## 2016-10-22 DIAGNOSIS — I130 Hypertensive heart and chronic kidney disease with heart failure and stage 1 through stage 4 chronic kidney disease, or unspecified chronic kidney disease: Secondary | ICD-10-CM | POA: Diagnosis not present

## 2016-10-22 DIAGNOSIS — I2510 Atherosclerotic heart disease of native coronary artery without angina pectoris: Secondary | ICD-10-CM | POA: Diagnosis present

## 2016-10-22 DIAGNOSIS — Z21 Asymptomatic human immunodeficiency virus [HIV] infection status: Secondary | ICD-10-CM | POA: Diagnosis present

## 2016-10-22 DIAGNOSIS — Z7982 Long term (current) use of aspirin: Secondary | ICD-10-CM | POA: Diagnosis not present

## 2016-10-22 DIAGNOSIS — R002 Palpitations: Secondary | ICD-10-CM | POA: Diagnosis present

## 2016-10-22 DIAGNOSIS — Z720 Tobacco use: Secondary | ICD-10-CM | POA: Diagnosis present

## 2016-10-22 DIAGNOSIS — I739 Peripheral vascular disease, unspecified: Secondary | ICD-10-CM | POA: Diagnosis not present

## 2016-10-22 DIAGNOSIS — N182 Chronic kidney disease, stage 2 (mild): Secondary | ICD-10-CM | POA: Diagnosis not present

## 2016-10-22 DIAGNOSIS — N189 Chronic kidney disease, unspecified: Secondary | ICD-10-CM

## 2016-10-22 DIAGNOSIS — R0602 Shortness of breath: Secondary | ICD-10-CM | POA: Diagnosis not present

## 2016-10-22 DIAGNOSIS — N289 Disorder of kidney and ureter, unspecified: Secondary | ICD-10-CM

## 2016-10-22 DIAGNOSIS — Z7902 Long term (current) use of antithrombotics/antiplatelets: Secondary | ICD-10-CM | POA: Diagnosis not present

## 2016-10-22 LAB — BASIC METABOLIC PANEL
ANION GAP: 2 — AB (ref 5–15)
BUN: 13 mg/dL (ref 6–20)
CALCIUM: 8.7 mg/dL — AB (ref 8.9–10.3)
CO2: 27 mmol/L (ref 22–32)
CREATININE: 1.6 mg/dL — AB (ref 0.61–1.24)
Chloride: 109 mmol/L (ref 101–111)
GFR, EST AFRICAN AMERICAN: 53 mL/min — AB (ref >60)
GFR, EST NON AFRICAN AMERICAN: 46 mL/min — AB (ref >60)
Glucose, Bld: 127 mg/dL — ABNORMAL HIGH (ref 65–99)
Potassium: 3.6 mmol/L (ref 3.5–5.1)
Sodium: 138 mmol/L (ref 135–145)

## 2016-10-22 MED ORDER — CLOPIDOGREL BISULFATE 75 MG PO TABS
75.0000 mg | ORAL_TABLET | Freq: Every day | ORAL | Status: DC
  Administered 2016-10-22 – 2016-10-23 (×2): 75 mg via ORAL
  Filled 2016-10-22 (×2): qty 1

## 2016-10-22 MED ORDER — HYDROCODONE-ACETAMINOPHEN 10-325 MG PO TABS: 1.0000 | ORAL_TABLET | Freq: Three times a day (TID) | ORAL | Status: DC | PRN

## 2016-10-22 MED ORDER — ASPIRIN 81 MG PO CHEW
81.0000 mg | CHEWABLE_TABLET | ORAL | Status: AC
  Administered 2016-10-23: 81 mg via ORAL
  Filled 2016-10-22: qty 1

## 2016-10-22 MED ORDER — SODIUM CHLORIDE 0.9 % IV SOLN: 250.0000 mL | INTRAVENOUS | Status: DC | PRN

## 2016-10-22 MED ORDER — SODIUM CHLORIDE 0.9% FLUSH: 3.0000 mL | INTRAVENOUS | Status: DC | PRN

## 2016-10-22 MED ORDER — TRAZODONE HCL 100 MG PO TABS
100.0000 mg | ORAL_TABLET | Freq: Every day | ORAL | Status: DC
  Filled 2016-10-22: qty 1

## 2016-10-22 MED ORDER — MOMETASONE FURO-FORMOTEROL FUM 100-5 MCG/ACT IN AERO
2.0000 | INHALATION_SPRAY | Freq: Two times a day (BID) | RESPIRATORY_TRACT | Status: DC
  Administered 2016-10-22: 2 via RESPIRATORY_TRACT
  Filled 2016-10-22: qty 8.8

## 2016-10-22 MED ORDER — PREGABALIN 50 MG PO CAPS
150.0000 mg | ORAL_CAPSULE | Freq: Every day | ORAL | Status: DC
  Administered 2016-10-22: 150 mg via ORAL
  Filled 2016-10-22: qty 3

## 2016-10-22 MED ORDER — SODIUM CHLORIDE 0.9 % WEIGHT BASED INFUSION
1.0000 mL/kg/h | INTRAVENOUS | Status: DC
  Administered 2016-10-23: 1 mL/kg/h via INTRAVENOUS

## 2016-10-22 MED ORDER — SODIUM CHLORIDE 0.9% FLUSH: 3.0000 mL | Freq: Two times a day (BID) | INTRAVENOUS | Status: DC

## 2016-10-22 MED ORDER — DILTIAZEM HCL ER COATED BEADS 180 MG PO CP24
180.0000 mg | ORAL_CAPSULE | Freq: Every day | ORAL | Status: DC
  Administered 2016-10-23: 180 mg via ORAL
  Filled 2016-10-22: qty 1

## 2016-10-22 MED ORDER — BICTEGRAVIR-EMTRICITAB-TENOFOV 50-200-25 MG PO TABS
1.0000 | ORAL_TABLET | Freq: Every day | ORAL | Status: DC
  Administered 2016-10-22: 1 via ORAL
  Filled 2016-10-22 (×2): qty 1

## 2016-10-22 MED ORDER — SODIUM CHLORIDE 0.9 % IV SOLN
INTRAVENOUS | Status: DC
  Administered 2016-10-22: 15:00:00 via INTRAVENOUS

## 2016-10-22 MED ORDER — CARVEDILOL 12.5 MG PO TABS
12.5000 mg | ORAL_TABLET | Freq: Two times a day (BID) | ORAL | Status: DC
  Administered 2016-10-22: 12.5 mg via ORAL
  Filled 2016-10-22 (×2): qty 1

## 2016-10-22 MED ORDER — HYDRALAZINE HCL 50 MG PO TABS
50.0000 mg | ORAL_TABLET | Freq: Three times a day (TID) | ORAL | Status: DC
  Administered 2016-10-22 – 2016-10-23 (×3): 50 mg via ORAL
  Filled 2016-10-22 (×3): qty 1

## 2016-10-22 MED ORDER — ALBUTEROL SULFATE (2.5 MG/3ML) 0.083% IN NEBU: 3.0000 mL | INHALATION_SOLUTION | RESPIRATORY_TRACT | Status: DC | PRN

## 2016-10-22 MED ORDER — ASPIRIN EC 325 MG PO TBEC: 325.0000 mg | DELAYED_RELEASE_TABLET | Freq: Every day | ORAL | Status: DC

## 2016-10-22 MED ORDER — ISOSORBIDE MONONITRATE ER 30 MG PO TB24
30.0000 mg | ORAL_TABLET | Freq: Every day | ORAL | Status: DC
  Administered 2016-10-22 – 2016-10-23 (×2): 30 mg via ORAL
  Filled 2016-10-22 (×2): qty 1

## 2016-10-22 NOTE — Progress Notes (Signed)
CARDIOLOGY ADMIT NOTE     Primary Care Physician: [name], DO Primary Cardiology Physician:  Dr Caro Laroche  Admit Date: 10/22/2016  Reason for consultation:  Kevin [name] is a 61 y.o. male with a h/o hypertension, AIDS, chronic diastolic dysfunction, COPD and CRI.  He also has afib, PVD, and OSA.  He was recently evaluated by Dr Debara Pickett.  He is admitted for consideration of cath.  Given renal failure, he will need hydration and then reassessment in am to see if he can proceed. Currently, he is resting comfortably.  Today, he denies symptoms of palpitations, exertional chest pain, shortness of breath (above baseline), orthopnea, PND, lower extremity edema, dizziness, presyncope, syncope, or neurologic sequela. The patient is tolerating medications without difficulties and is otherwise without complaint today.   Past Medical History:  Diagnosis Date  . AIDS (acquired immune deficiency syndrome) ([place]) 08/17/2016  . Chronic diastolic CHF (congestive heart failure), NYHA class 3 ([place]) 01/2016  . Chronic lower back pain   . CKD (chronic kidney disease), stage III   . COPD (chronic obstructive pulmonary disease) ([place])   . Gout    "forearms, hands, ankles, feet" (06/05/2016)  . Headache    "weekly" (06/05/2016)  . Heart murmur   . Hypertension   . OSA on CPAP   . PAD (peripheral artery disease) ([place])   . PAF (paroxysmal atrial fibrillation) ([place]) 01/2016   Past Surgical History:  Procedure Laterality Date  . LOWER EXTREMITY ANGIOGRAPHY N/A 07/17/2016   Procedure: Lower Extremity Angiography;  Surgeon: Lorretta Harp, MD;  Location: [facility];  Service: Cardiovascular;  Laterality: N/A;  . LOWER EXTREMITY INTERVENTION N/A 06/05/2016   Procedure: Lower Extremity Intervention;  Surgeon: Lorretta Harp, MD;  Location: [facility];  Service: Cardiovascular;  Laterality: N/A;  . PERIPHERAL VASCULAR ATHERECTOMY Right 07/17/2016   Procedure: Peripheral Vascular  Atherectomy;  Surgeon: Lorretta Harp, MD;  Location: [place] [facility];  Service: Cardiovascular;  Laterality: Right;  SFA  . PERIPHERAL VASCULAR INTERVENTION  06/05/2016   Procedure: Peripheral Vascular Intervention;  Surgeon: Lorretta Harp, MD;  Location: [facility];  Service: Cardiovascular;;  left SFA     . sodium chloride 50 mL/hr at 10/22/16 1522    No Known Allergies  Social History   Social History  . Marital status: Divorced    Spouse name: N/A  . Number of children: N/A  . Years of education: N/A   Occupational History  . Not on file.   Social History Main Topics  . Smoking status: Current Every Day Smoker    Packs/day: 0.50    Years: 42.00    Types: Cigarettes  . Smokeless tobacco: Never Used  . Alcohol use 1.2 oz/week    2 Cans of beer per week  . Drug use: No  . Sexual activity: Not Currently   Other Topics Concern  . Not on file   Social History Narrative  . No narrative on file    Family History  Problem Relation Age of Onset  . High blood pressure Mother   . Lupus Mother     ROS- All systems are reviewed and negative except as per the HPI above  Physical Exam: Telemetry: Vitals:   10/22/16 1436  BP: 129/77  Pulse: (!) 58  Temp: 97.6 F (36.4 C)  TempSrc: Oral  SpO2: 100%  Weight: 167 lb (75.8 kg)  Height: 6' (1.829 m)    GEN- The patient is  chronically ill appearing, alert and oriented x 3 today.   Head- normocephalic, atraumatic Eyes-  Sclera clear, conjunctiva pink Ears- hearing intact Oropharynx- clear Neck- supple,  Lungs- Clear to ausculation bilaterally, normal work of breathing Heart- Regular rate and rhythm, no murmurs, rubs or gallops, PMI not laterally displaced GI- soft, NT, ND, + BS Extremities- no clubbing, cyanosis, or edema MS- no significant deformity or atrophy Skin- no rash or lesion Psych- euthymic mood, full affect Neuro- strength and sensation are intact  EKG 10/19/16 reveals sinus  bradycardia at 46 bpm, first degree AV block, no ischemic changes  Labs:   Lab Results  Component Value Date   WBC 10.5 08/17/2016   HGB 10.4 (L) 08/17/2016   HCT 31.3 (L) 08/17/2016   MCV 98.4 08/17/2016   PLT 207 08/17/2016   No results for input(s): NA, K, CL, CO2, BUN, CREATININE, CALCIUM, PROT, BILITOT, ALKPHOS, ALT, AST, GLUCOSE in the last 168 hours.  Invalid input(s): LABALBU Lab Results  Component Value Date   CKTOTAL 39 02/24/2016   TROPONINI <0.03 02/08/2016    Lab Results  Component Value Date   CHOL 156 07/17/2016   CHOL 207 (H) 07/03/2016   Lab Results  Component Value Date   HDL 50 07/17/2016   HDL 75 07/03/2016   Lab Results  Component Value Date   LDLCALC 81 07/17/2016   LDLCALC 114 (H) 07/03/2016   Lab Results  Component Value Date   TRIG 123 07/17/2016   TRIG 88 07/03/2016   Lab Results  Component Value Date   CHOLHDL 3.1 07/17/2016   CHOLHDL 2.8 07/03/2016   No results found for: LDLDIRECT     Echo: 02/09/16 is reviewed.  EF 80%, diastolic dysfunction  ASSESSMENT AND PLAN:   1. SOB He has dyspnea with exertion of unclear etiology.  He recently had stress testing which revealed a fixed inferior wall defect.  Dr Debara Pickett is concerned for ischemia/ CAD.  The patient has COPD also which could be the cause, without ongoing tobacco use.  Tobacco cessation is advised. Will gently hydrate overnight and reassess renal function in am to determine if he is cath candidate.  2. Tobacco Cessation advised  3. HTN Stable No change required today  4. Paroxysmal atrial fibrillation chads2vasc score is 2 (PVD, HTN) Would consider anticoagulation pending results of cath  5. OSA Compliance with cpap is encouraged  6. CRI Gently hydrate and reassess renal function in am.   Thompson Grayer, MD 10/22/2016  3:46 PM

## 2016-10-22 NOTE — Plan of Care (Signed)
Problem: Safety: Goal: Ability to remain free from injury will improve Outcome: Progressing Oriented to unit and Room. Call for needs.

## 2016-10-22 NOTE — Plan of Care (Signed)
Problem: Activity: Goal: Ability to return to baseline activity level will improve Outcome: Progressing Ambulates in room independently. Educated on how to use call light system to call for assistance prior to ambulation when necessary

## 2016-10-22 NOTE — Progress Notes (Signed)
Patient refused CPAP. States he does not wear it at home. Encouraged patient to call for Respiratory in he would like to use CPAP during his stay.

## 2016-10-23 ENCOUNTER — Encounter (HOSPITAL_COMMUNITY)
Admission: AD | Disposition: A | Discharge status: Home / Self Care | Payer: Self-pay | Source: Ambulatory Visit | Attending: Internal Medicine

## 2016-10-23 ENCOUNTER — Telehealth: Payer: Self-pay | Admitting: Internal Medicine

## 2016-10-23 ENCOUNTER — Ambulatory Visit: Payer: Medicaid Other | Admitting: Infectious Disease

## 2016-10-23 ENCOUNTER — Other Ambulatory Visit: Payer: Self-pay | Admitting: Cardiology

## 2016-10-23 DIAGNOSIS — I5032 Chronic diastolic (congestive) heart failure: Secondary | ICD-10-CM | POA: Diagnosis not present

## 2016-10-23 DIAGNOSIS — R0609 Other forms of dyspnea: Secondary | ICD-10-CM

## 2016-10-23 DIAGNOSIS — N183 Chronic kidney disease, stage 3 (moderate): Secondary | ICD-10-CM | POA: Diagnosis not present

## 2016-10-23 DIAGNOSIS — Z720 Tobacco use: Secondary | ICD-10-CM | POA: Diagnosis not present

## 2016-10-23 DIAGNOSIS — I739 Peripheral vascular disease, unspecified: Secondary | ICD-10-CM

## 2016-10-23 DIAGNOSIS — N289 Disorder of kidney and ureter, unspecified: Secondary | ICD-10-CM

## 2016-10-23 DIAGNOSIS — I25110 Atherosclerotic heart disease of native coronary artery with unstable angina pectoris: Secondary | ICD-10-CM

## 2016-10-23 DIAGNOSIS — D649 Anemia, unspecified: Secondary | ICD-10-CM

## 2016-10-23 DIAGNOSIS — N189 Chronic kidney disease, unspecified: Secondary | ICD-10-CM

## 2016-10-23 DIAGNOSIS — I480 Paroxysmal atrial fibrillation: Secondary | ICD-10-CM | POA: Diagnosis not present

## 2016-10-23 DIAGNOSIS — R9439 Abnormal result of other cardiovascular function study: Secondary | ICD-10-CM | POA: Diagnosis not present

## 2016-10-23 DIAGNOSIS — B20 Human immunodeficiency virus [HIV] disease: Secondary | ICD-10-CM | POA: Diagnosis not present

## 2016-10-23 DIAGNOSIS — I130 Hypertensive heart and chronic kidney disease with heart failure and stage 1 through stage 4 chronic kidney disease, or unspecified chronic kidney disease: Secondary | ICD-10-CM | POA: Diagnosis not present

## 2016-10-23 HISTORY — PX: LEFT HEART CATH AND CORONARY ANGIOGRAPHY: CATH118249

## 2016-10-23 LAB — BASIC METABOLIC PANEL
ANION GAP: 5 (ref 5–15)
Anion gap: 5 (ref 5–15)
BUN: 16 mg/dL (ref 6–20)
BUN: 13 mg/dL (ref 6–20)
CHLORIDE: 113 mmol/L — AB (ref 101–111)
CO2: 26 mmol/L (ref 22–32)
CO2: 23 mmol/L (ref 22–32)
Calcium: 7.9 mg/dL — ABNORMAL LOW (ref 8.9–10.3)
Calcium: 8.1 mg/dL — ABNORMAL LOW (ref 8.9–10.3)
Chloride: 109 mmol/L (ref 101–111)
Creatinine, Ser: 1.8 mg/dL — ABNORMAL HIGH (ref 0.61–1.24)
Creatinine, Ser: 1.53 mg/dL — ABNORMAL HIGH (ref 0.61–1.24)
GFR calc Af Amer: 56 mL/min — ABNORMAL LOW (ref >60)
GFR, EST AFRICAN AMERICAN: 46 mL/min — AB (ref >60)
GFR, EST NON AFRICAN AMERICAN: 40 mL/min — AB (ref >60)
GFR, EST NON AFRICAN AMERICAN: 48 mL/min — AB (ref >60)
GLUCOSE: 93 mg/dL (ref 65–99)
Glucose, Bld: 118 mg/dL — ABNORMAL HIGH (ref 65–99)
POTASSIUM: 3.3 mmol/L — AB (ref 3.5–5.1)
Potassium: 3.5 mmol/L (ref 3.5–5.1)
SODIUM: 140 mmol/L (ref 135–145)
Sodium: 141 mmol/L (ref 135–145)

## 2016-10-23 LAB — CBC
HCT: 25.4 % — ABNORMAL LOW (ref 39.0–52.0)
HCT: 25.2 % — ABNORMAL LOW (ref 39.0–52.0)
Hemoglobin: 8.5 g/dL — ABNORMAL LOW (ref 13.0–17.0)
Hemoglobin: 8.4 g/dL — ABNORMAL LOW (ref 13.0–17.0)
MCH: 31.7 pg (ref 26.0–34.0)
MCH: 31.6 pg (ref 26.0–34.0)
MCHC: 33.5 g/dL (ref 30.0–36.0)
MCHC: 33.3 g/dL (ref 30.0–36.0)
MCV: 94.8 fL (ref 78.0–100.0)
MCV: 94.7 fL (ref 78.0–100.0)
PLATELETS: 306 10*3/uL (ref 150–400)
PLATELETS: 294 10*3/uL (ref 150–400)
RBC: 2.68 MIL/uL — ABNORMAL LOW (ref 4.22–5.81)
RBC: 2.66 MIL/uL — AB (ref 4.22–5.81)
RDW: 14.3 % (ref 11.5–15.5)
RDW: 14.3 % (ref 11.5–15.5)
WBC: 7.5 10*3/uL (ref 4.0–10.5)
WBC: 6.7 10*3/uL (ref 4.0–10.5)

## 2016-10-23 LAB — PROTIME-INR
INR: 1.23
PROTHROMBIN TIME: 15.4 s — AB (ref 11.4–15.2)

## 2016-10-23 SURGERY — LEFT HEART CATH AND CORONARY ANGIOGRAPHY
Anesthesia: LOCAL

## 2016-10-23 MED ORDER — HEPARIN SODIUM (PORCINE) 1000 UNIT/ML IJ SOLN
INTRAMUSCULAR | Status: AC
  Filled 2016-10-23: qty 1

## 2016-10-23 MED ORDER — IOPAMIDOL (ISOVUE-370) INJECTION 76%
INTRAVENOUS | Status: DC | PRN
  Administered 2016-10-23: 100 mL via INTRA_ARTERIAL

## 2016-10-23 MED ORDER — VERAPAMIL HCL 2.5 MG/ML IV SOLN
INTRAVENOUS | Status: DC | PRN
  Administered 2016-10-23: 10 mL via INTRA_ARTERIAL

## 2016-10-23 MED ORDER — SODIUM CHLORIDE 0.9 % IV SOLN
INTRAVENOUS | Status: AC
  Administered 2016-10-23: 15:00:00 via INTRAVENOUS

## 2016-10-23 MED ORDER — LIDOCAINE HCL (PF) 1 % IJ SOLN
INTRAMUSCULAR | Status: DC | PRN
  Administered 2016-10-23: 2 mL

## 2016-10-23 MED ORDER — IOPAMIDOL (ISOVUE-370) INJECTION 76%
INTRAVENOUS | Status: AC
  Filled 2016-10-23: qty 100

## 2016-10-23 MED ORDER — MIDAZOLAM HCL 2 MG/2ML IJ SOLN
INTRAMUSCULAR | Status: DC | PRN
  Administered 2016-10-23: 2 mg via INTRAVENOUS

## 2016-10-23 MED ORDER — SODIUM CHLORIDE 0.9% FLUSH: 3.0000 mL | INTRAVENOUS | Status: DC | PRN

## 2016-10-23 MED ORDER — FENTANYL CITRATE (PF) 100 MCG/2ML IJ SOLN
INTRAMUSCULAR | Status: DC | PRN
  Administered 2016-10-23: 50 ug via INTRAVENOUS

## 2016-10-23 MED ORDER — MIDAZOLAM HCL 2 MG/2ML IJ SOLN
INTRAMUSCULAR | Status: AC
  Filled 2016-10-23: qty 2

## 2016-10-23 MED ORDER — HEPARIN SODIUM (PORCINE) 1000 UNIT/ML IJ SOLN
INTRAMUSCULAR | Status: DC | PRN
  Administered 2016-10-23: 4000 [IU] via INTRAVENOUS

## 2016-10-23 MED ORDER — SODIUM CHLORIDE 0.9 % IV SOLN: 250.0000 mL | INTRAVENOUS | Status: DC | PRN

## 2016-10-23 MED ORDER — FENTANYL CITRATE (PF) 100 MCG/2ML IJ SOLN
INTRAMUSCULAR | Status: AC
  Filled 2016-10-23: qty 2

## 2016-10-23 MED ORDER — HEPARIN (PORCINE) IN NACL 2-0.9 UNIT/ML-% IJ SOLN
INTRAMUSCULAR | Status: AC
  Filled 2016-10-23: qty 1000

## 2016-10-23 MED ORDER — SODIUM CHLORIDE 0.9% FLUSH: 3.0000 mL | Freq: Two times a day (BID) | INTRAVENOUS | Status: DC

## 2016-10-23 MED ORDER — HEPARIN (PORCINE) IN NACL 2-0.9 UNIT/ML-% IJ SOLN
INTRAMUSCULAR | Status: AC | PRN
  Administered 2016-10-23: 1000 mL

## 2016-10-23 MED ORDER — LIDOCAINE HCL (PF) 1 % IJ SOLN
INTRAMUSCULAR | Status: AC
  Filled 2016-10-23: qty 30

## 2016-10-23 SURGICAL SUPPLY — 11 items
CATH LAUNCHER 5F RADL (CATHETERS) ×1 IMPLANT
CATH OPTITORQUE TIG 4.0 5F (CATHETERS) ×2 IMPLANT
CATHETER LAUNCHER 5F RADL (CATHETERS) ×2
DEVICE RAD COMP TR BAND LRG (VASCULAR PRODUCTS) ×2 IMPLANT
GLIDESHEATH SLEND A-KIT 6F 22G (SHEATH) ×2 IMPLANT
GUIDEWIRE INQWIRE 1.5J.035X260 (WIRE) ×1 IMPLANT
INQWIRE 1.5J .035X260CM (WIRE) ×2
KIT HEART LEFT (KITS) ×2 IMPLANT
PACK CARDIAC CATHETERIZATION (CUSTOM PROCEDURE TRAY) ×2 IMPLANT
TRANSDUCER W/STOPCOCK (MISCELLANEOUS) ×2 IMPLANT
TUBING CIL FLEX 10 FLL-RA (TUBING) ×2 IMPLANT

## 2016-10-23 NOTE — Progress Notes (Signed)
Progress Note  Patient Name: Kevin [name] Date of Encounter: 10/23/2016  Primary Cardiologist: Dr. Debara Pickett  Subjective   Patient is feeling well; denies chest pain, SOB, and palpitations. Pt is frustrated he has not yet gone for his cath.  Inpatient Medications    Scheduled Meds: . [START ON 10/24/2016] aspirin  325 mg Oral Daily  . bictegravir-emtricitabine-tenofovir AF  1 tablet Oral QHS  . carvedilol  12.5 mg Oral BID  . clopidogrel  75 mg Oral Q breakfast  . diltiazem  180 mg Oral Daily  . hydrALAZINE  50 mg Oral TID  . isosorbide mononitrate  30 mg Oral Daily  . mometasone-formoterol  2 puff Inhalation BID  . pregabalin  150 mg Oral QHS  . sodium chloride flush  3 mL Intravenous Q12H  . traZODone  100 mg Oral QHS   Continuous Infusions: . sodium chloride Stopped (10/22/16 1955)  . sodium chloride    . sodium chloride 1 mL/kg/hr (10/23/16 7619)   PRN Meds: sodium chloride, albuterol, HYDROcodone-acetaminophen, sodium chloride flush   Vital Signs    Vitals:   10/22/16 2116 10/22/16 2219 10/23/16 0047 10/23/16 0505  BP:  110/62 110/61 113/68  Pulse: 63 71 61 (!) 59  Resp:  16    Temp:  98.6 F (37 C) 98.9 F (37.2 C) 98.2 F (36.8 C)  TempSrc:  Oral Oral Oral  SpO2:  100% 99% 98%  Weight:    168 lb 14.4 oz (76.6 kg)  Height:        Intake/Output Summary (Last 24 hours) at 10/23/16 0921 Last data filed at 10/23/16 5093  Gross per 24 hour  Intake           992.73 ml  Output                0 ml  Net           992.73 ml   Filed Weights   10/22/16 1436 10/23/16 0505  Weight: 167 lb (75.8 kg) 168 lb 14.4 oz (76.6 kg)     Physical Exam   General: Well developed, well nourished, male appearing in no acute distress. Head: Normocephalic, atraumatic.  Neck: Supple without bruits, no JVD. Lungs:  Resp regular and unlabored, CTA, slightly diminished in bases Heart: RRR, S1, S2, no S3, S4, or murmur; no rub. Abdomen: Soft, non-tender, non-distended with  normoactive bowel sounds. No hepatomegaly. No rebound/guarding. No obvious abdominal masses. Extremities: No clubbing, cyanosis, no edema. Distal pedal pulses are 1+ bilaterally. Neuro: Alert and oriented X 3. Moves all extremities spontaneously. Psych: Normal affect.  Labs    Chemistry Recent Labs Lab 10/22/16 1613 10/23/16 0140  NA 138 140  K 3.6 3.3*  CL 109 109  CO2 27 26  GLUCOSE 127* 118*  BUN 13 16  CREATININE 1.60* 1.80*  CALCIUM 8.7* 7.9*  GFRNONAA 46* 40*  GFRAA 53* 46*  ANIONGAP 2* 5     Hematology Recent Labs Lab 10/23/16 0140  WBC 7.5  RBC 2.68*  HGB 8.5*  HCT 25.4*  MCV 94.8  MCH 31.7  MCHC 33.5  RDW 14.3  PLT 306    Cardiac EnzymesNo results for input(s): TROPONINI in the last 168 hours. No results for input(s): TROPIPOC in the last 168 hours.   BNPNo results for input(s): BNP, PROBNP in the last 168 hours.   DDimer No results for input(s): DDIMER in the last 168 hours.   Radiology    No results found.  Telemetry    NSR - Personally Reviewed  ECG    No new tracings - Personally Reviewed   Cardiac Studies   Left heart cath: pending  Myoview 03/31/16:  The left ventricular ejection fraction is mildly decreased (45-54%). The LV is moderately dilated.  Nuclear stress EF: 47%.  Defect 1: There is a medium defect of moderate severity present in the basal inferior, mid inferior and apical inferior location. This is consistent with an inferior wall MI.  This is an intermediate risk study.  Patient Profile     61 y.o. male with a h/o hypertension, AIDS, chronic diastolic dysfunction, COPD and CRI.  He also has afib, PVD, and OSA.   Assessment & Plan    1. Shortness of breath, elective heart cath for abnormal stress test - pt presented overnight for IVF hydration in anticipation for heart catheterization today - pt is not short of breath this morning, denies chest pain, palpitations, dizziness, and feelings of syncope - Hb 8.5,  below baseline; he denies hematuria, melena, and hematemesis  - interventional team notified - ASA and plavix continued - patient refused part of heart cath prep last night  2. Acute on chronic kidney disease stage II - sCr increased overnight 1.80 (1.60), baseline appears to be 1.25-1.35 - He states that he is making urine as per his usual baseline  3. paroxysmal Afib - NSR on telemetry - continue diltiazem  4. Chronic diastolic heart failure - stable, not volume up on exam  5. COPD - stable, on room air - smoking cessation encouraged  6. HTN - stable on current meds, sBP 110-110s  7. OSA - pt refused CPAP last evening  Interventional team notified that his Hb has dropped compared to 08/17/16 (10.4) and that his sCr bumped overnight to 1.80 from 1.60. He does not have any signs of bleeding and is not symptomatic. Will await recommendations for proceeding with heart cath today. Pt remains NPO.    Signed, Ledora Bottcher , PA-C 9:21 AM 10/23/2016 Pager: [phone]  I have seen and examined the patient along with Ledora Bottcher , PA-C.  I have reviewed the chart, notes and new data.  I agree with PA's note.  Key new complaints: He denies angina, dyspnea, recent bleeding Key examination changes: No overt evidence of hypo-or hypervolemia Key new findings / data: His creatinine of 1.8 is pretty much in the middle of the range that it has oscillated over the last 12 months. It is nominally higher than the creatinine from yesterday, but I don't think that is a meaningful change. Similarly although his hemoglobin is lower than the previous value, it is not much lower than his usual hemoglobin which is in the mid 9 range.  PLAN: No overt bleeding. Normocytic anemia deserves evaluation, could be related to chronic HIV infection or its treatment, renal insufficiency as well as chronic iron deficiency related to use of antiplatelet/anticoagulant.  Sanda Klein, MD, [place] [phone] 10/23/2016, 11:35 AM

## 2016-10-23 NOTE — Interval H&P Note (Signed)
History and Physical Interval Note:  10/23/2016 2:22 PM  Kevin [name]  has presented today for surgery, with the diagnosis of unstable angina, abnormal ekg, fatigue / exertional dyspnea with abnormal nuclear stress test.   The various methods of treatment have been discussed with the patient and family. After consideration of risks, benefits and other options for treatment, the patient has consented to  Procedure(s): LEFT HEART CATH AND CORONARY ANGIOGRAPHY (N/A) with possible PERCUTANEOUS CORONARY INTERVENTION as a surgical intervention .  The patient's history has been reviewed, patient examined, no change in status, stable for surgery.  I have reviewed the patient's chart and labs.  Questions were answered to the patient's satisfaction.    Cath Lab Visit (complete for each Cath Lab visit)  Clinical Evaluation Leading to the Procedure:   ACS: No.  Non-ACS:    Anginal Classification: CCS III  Anti-ischemic medical therapy: Maximal Therapy (2 or more classes of medications)  Non-Invasive Test Results: Intermediate-risk stress test findings: cardiac mortality 1-3%/year  Prior CABG: No previous CABG    Glenetta Hew

## 2016-10-23 NOTE — Progress Notes (Signed)
Pt refused groin clip. States "I was told they would be going through my wrist so I don't need that."

## 2016-10-23 NOTE — Discharge Summary (Signed)
Discharge Summary    Patient ID: Kevin [name],  MRN: [id number], DOB/AGE: December 14, 1955 61 y.o.  Admit date: 10/22/2016 Discharge date: 10/24/2016  Primary Care Provider: [name] Primary Cardiologist: [facility]   Discharge Diagnoses    Principal Problem:   Coronary artery disease involving native coronary artery of native heart with unstable angina pectoris [facility]) Active Problems:   Shortness of breath   PAF (paroxysmal atrial fibrillation) (HCC)   Tobacco abuse   Acute on chronic renal insufficiency   Peripheral arterial disease (HCC)   Stage 3 chronic kidney disease   HIV disease ([place])   Allergies No Known Allergies  Diagnostic Studies/Procedures    LHC: 10/23/16  Conclusion     Angiographically normal coronary arteries. Left dominant system  Normal LV function with mildly elevated LVEDP  The left ventricular ejection fraction is 55-65% by visual estimate.   With normal LV function, LVEDP and normal coronaries, would recommend evaluating for noncardiac etiology for dyspnea.  Plan: Return to nursing unit for TR band removal and at least 4 hours of IV hydration given his renal insufficiency.   He can then be discharged ~ 7 PM   _____________   History of Present Illness     Kevin [name] is a 61 y.o. male with a h/o hypertension, AIDS, chronic diastolic dysfunction, COPD and CRI.  He also has afib, PVD, and OSA.  He was recently evaluated by Dr Debara Pickett.  He was admitted for consideration of cath.  Given renal failure, he was hydrated overnight with plans for cath.  Hospital Course     Labs the following morning showed stable Cr at 1.5 but drop in Hgb to 8.4. Denies any bleeding issues, with noted history of anemia. Normal Hgb range in the mid 9s. Underwent cardiac cath with Dr. Ellyn Hack noted above with normal coronaries, LV function and LVEDP. Developed mild hematoma post cath that corrected with manuel pressure. He was continued on home  medications without any changes. Will plan for follow up BMET and CBC with anemia panel at follow up appt. Close follow up in the office has been arranged. Medications are listed below. Determined stable for discharge home by Dr. Sallyanne Kuster.   _____________  Discharge Vitals Blood pressure (!) 148/72, pulse (!) 54, temperature 98.7 F (37.1 C), temperature source Oral, resp. rate 14, height 6' (1.829 m), weight 168 lb 14.4 oz (76.6 kg), SpO2 100 %.  Filed Weights   10/22/16 1436 10/23/16 0505  Weight: 167 lb (75.8 kg) 168 lb 14.4 oz (76.6 kg)    Labs & Radiologic Studies    CBC  Recent Labs  10/23/16 0140 10/23/16 1107  WBC 7.5 6.7  HGB 8.5* 8.4*  HCT 25.4* 25.2*  MCV 94.8 94.7  PLT 306 388   Basic Metabolic Panel  Recent Labs  10/23/16 0140 10/23/16 1107  NA 140 141  K 3.3* 3.5  CL 109 113*  CO2 26 23  GLUCOSE 118* 93  BUN 16 13  CREATININE 1.80* 1.53*  CALCIUM 7.9* 8.1*   Liver Function Tests No results for input(s): AST, ALT, ALKPHOS, BILITOT, PROT, ALBUMIN in the last 72 hours. No results for input(s): LIPASE, AMYLASE in the last 72 hours. Cardiac Enzymes No results for input(s): CKTOTAL, CKMB, CKMBINDEX, TROPONINI in the last 72 hours. BNP Invalid input(s): POCBNP D-Dimer No results for input(s): DDIMER in the last 72 hours. Hemoglobin A1C No results for input(s): HGBA1C in the last 72 hours. Fasting Lipid Panel No results  for input(s): CHOL, HDL, LDLCALC, TRIG, CHOLHDL, LDLDIRECT in the last 72 hours. Thyroid Function Tests No results for input(s): TSH, T4TOTAL, T3FREE, THYROIDAB in the last 72 hours.  Invalid input(s): FREET3 _____________  No results found. Disposition   Pt is being discharged home today in good condition.  Follow-up Plans & Appointments    Follow-up Information    Almyra Deforest, [name] Follow up on 10/26/2016.   Specialties:  Cardiology, Radiology Why:  at 11:30am for your follow up appt. Please come in for labs prior to appt.    Contact information: [address] [place] [address] [phone]          Discharge Instructions    Call MD for:  redness, tenderness, or signs of infection (pain, swelling, redness, odor or green/yellow discharge around incision site)    Complete by:  As directed    Diet - low sodium heart healthy    Complete by:  As directed    Discharge instructions    Complete by:  As directed    Radial Site Care Refer to this sheet in the next few weeks. These instructions provide you with information on caring for yourself after your procedure. Your caregiver may also give you more specific instructions. Your treatment has been planned according to current medical practices, but problems sometimes occur. Call your caregiver if you have any problems or questions after your procedure. HOME CARE INSTRUCTIONS You may shower the day after the procedure.Remove the bandage (dressing) and gently wash the site with plain soap and water.Gently pat the site dry.  Do not apply powder or lotion to the site.  Do not submerge the affected site in water for 3 to 5 days.  Inspect the site at least twice daily.  Do not flex or bend the affected arm for 24 hours.  No lifting over 5 pounds (2.3 kg) for 5 days after your procedure.  Do not drive home if you are discharged the same day of the procedure. Have someone else drive you.  You may drive 24 hours after the procedure unless otherwise instructed by your caregiver.  What to expect: Any bruising will usually fade within 1 to 2 weeks.  Blood that collects in the tissue (hematoma) may be painful to the touch. It should usually decrease in size and tenderness within 1 to 2 weeks.  SEEK IMMEDIATE MEDICAL CARE IF: You have unusual pain at the radial site.  You have redness, warmth, swelling, or pain at the radial site.  You have drainage (other than a small amount of blood on the dressing).  You have chills.  You have a fever or persistent  symptoms for more than 72 hours.  You have a fever and your symptoms suddenly get worse.  Your arm becomes pale, cool, tingly, or numb.  You have heavy bleeding from the site. Hold pressure on the site.   Increase activity slowly    Complete by:  As directed       Discharge Medications     Medication List    TAKE these medications   aspirin 81 MG EC tablet Take 1 tablet (81 mg total) by mouth daily. What changed:  Another medication with the same name was removed. Continue taking this medication, and follow the directions you see here.   bictegravir-emtricitabine-tenofovir AF 50-200-25 MG Tabs tablet Commonly known as:  BIKTARVY Take 1 tablet by mouth daily. What changed:  when to take this   CARTIA XT 180 MG 24  hr capsule Generic drug:  diltiazem TAKE ONE CAPSULE BY MOUTH DAILY   carvedilol 12.5 MG tablet Commonly known as:  COREG Take 1 tablet (12.5 mg total) by mouth 2 (two) times daily with a meal. What changed:  when to take this   clopidogrel 75 MG tablet Commonly known as:  PLAVIX Take 1 tablet (75 mg total) by mouth daily with breakfast. What changed:  when to take this   DULERA 100-5 MCG/ACT Aero Generic drug:  mometasone-formoterol INHALE 2 PUFFS INTO THE LUNGS TWICE DAILY   furosemide 40 MG tablet Commonly known as:  LASIX TAKE 1 TABLET BY MOUTH DAILY What changed:  See the new instructions.   hydrALAZINE 50 MG tablet Commonly known as:  APRESOLINE TAKE 1 tablet (50 MG) EVERY 8 HOURS What changed:  how much to take  how to take this  when to take this  additional instructions   HYDROcodone-acetaminophen 10-325 MG tablet Commonly known as:  NORCO Take 1 tablet by mouth every 8 (eight) hours as needed. What changed:  reasons to take this   isosorbide mononitrate 30 MG 24 hr tablet Commonly known as:  IMDUR TAKE 1 TABLET BY MOUTH DAILY   LYRICA 150 MG capsule Generic drug:  pregabalin Take 150 mg by mouth at bedtime.   potassium  chloride 10 MEQ tablet Commonly known as:  K-DUR Take 2 tablets (20 mEq total) by mouth daily. What changed:  when to take this   PROAIR HFA 108 (90 Base) MCG/ACT inhaler Generic drug:  albuterol INHALE 2 PUFFS INTO THE LUNGS EVERY 6 HOURS AS NEEDED FOR WHEEZING OR SHORTNESS OF BREATH   SPIRIVA HANDIHALER 18 MCG inhalation capsule Generic drug:  tiotropium INHALE CONTENTS OF 1 CAPSULE ONCE DAILY USING HANDIHALER What changed:  See the new instructions.   traZODone 100 MG tablet Commonly known as:  DESYREL Take 100 mg by mouth at bedtime.         Outstanding Labs/Studies   N/a  Duration of Discharge Encounter   Greater than 30 minutes including physician time.  Signed, Reino Bellis NP-C 10/24/2016, 9:33 AM

## 2016-10-23 NOTE — Progress Notes (Signed)
Called to 3w for right arm hematoma. Manual pressure was being applied by Floria Raveling.   No palpable hematoma present, slight swelling noted to medial forearm approximately 3 1/2 inches down from the crease of his fore arm. TR band still in place on right wrist with no hematoma proximal or distal to cath site. Blood pressure cuff applied to right arm and inflated while maintaining spo2 measurement in right hand. Cuff left inflated for 5 minutes. Cuff deflated for 1 minute, then inflated for another 5 minutes. Site stable , no further bleeding or hematoma present. Additional pillow added to prop his right hand and forearm for comfort.

## 2016-10-23 NOTE — H&P (View-Only) (Signed)
Progress Note  Patient Name: Kevin [name] Date of Encounter: 10/23/2016  Primary Cardiologist: Dr. Debara Pickett  Subjective   Patient is feeling well; denies chest pain, SOB, and palpitations. Pt is frustrated he has not yet gone for his cath.  Inpatient Medications    Scheduled Meds: . [START ON 10/24/2016] aspirin  325 mg Oral Daily  . bictegravir-emtricitabine-tenofovir AF  1 tablet Oral QHS  . carvedilol  12.5 mg Oral BID  . clopidogrel  75 mg Oral Q breakfast  . diltiazem  180 mg Oral Daily  . hydrALAZINE  50 mg Oral TID  . isosorbide mononitrate  30 mg Oral Daily  . mometasone-formoterol  2 puff Inhalation BID  . pregabalin  150 mg Oral QHS  . sodium chloride flush  3 mL Intravenous Q12H  . traZODone  100 mg Oral QHS   Continuous Infusions: . sodium chloride Stopped (10/22/16 1955)  . sodium chloride    . sodium chloride 1 mL/kg/hr (10/23/16 3474)   PRN Meds: sodium chloride, albuterol, HYDROcodone-acetaminophen, sodium chloride flush   Vital Signs    Vitals:   10/22/16 2116 10/22/16 2219 10/23/16 0047 10/23/16 0505  BP:  110/62 110/61 113/68  Pulse: 63 71 61 (!) 59  Resp:  16    Temp:  98.6 F (37 C) 98.9 F (37.2 C) 98.2 F (36.8 C)  TempSrc:  Oral Oral Oral  SpO2:  100% 99% 98%  Weight:    168 lb 14.4 oz (76.6 kg)  Height:        Intake/Output Summary (Last 24 hours) at 10/23/16 0921 Last data filed at 10/23/16 2595  Gross per 24 hour  Intake           992.73 ml  Output                0 ml  Net           992.73 ml   Filed Weights   10/22/16 1436 10/23/16 0505  Weight: 167 lb (75.8 kg) 168 lb 14.4 oz (76.6 kg)     Physical Exam   General: Well developed, well nourished, male appearing in no acute distress. Head: Normocephalic, atraumatic.  Neck: Supple without bruits, no JVD. Lungs:  Resp regular and unlabored, CTA, slightly diminished in bases Heart: RRR, S1, S2, no S3, S4, or murmur; no rub. Abdomen: Soft, non-tender, non-distended with  normoactive bowel sounds. No hepatomegaly. No rebound/guarding. No obvious abdominal masses. Extremities: No clubbing, cyanosis, no edema. Distal pedal pulses are 1+ bilaterally. Neuro: Alert and oriented X 3. Moves all extremities spontaneously. Psych: Normal affect.  Labs    Chemistry Recent Labs Lab 10/22/16 1613 10/23/16 0140  NA 138 140  K 3.6 3.3*  CL 109 109  CO2 27 26  GLUCOSE 127* 118*  BUN 13 16  CREATININE 1.60* 1.80*  CALCIUM 8.7* 7.9*  GFRNONAA 46* 40*  GFRAA 53* 46*  ANIONGAP 2* 5     Hematology Recent Labs Lab 10/23/16 0140  WBC 7.5  RBC 2.68*  HGB 8.5*  HCT 25.4*  MCV 94.8  MCH 31.7  MCHC 33.5  RDW 14.3  PLT 306    Cardiac EnzymesNo results for input(s): TROPONINI in the last 168 hours. No results for input(s): TROPIPOC in the last 168 hours.   BNPNo results for input(s): BNP, PROBNP in the last 168 hours.   DDimer No results for input(s): DDIMER in the last 168 hours.   Radiology    No results found.  Telemetry    NSR - Personally Reviewed  ECG    No new tracings - Personally Reviewed   Cardiac Studies   Left heart cath: pending  Myoview 03/31/16:  The left ventricular ejection fraction is mildly decreased (45-54%). The LV is moderately dilated.  Nuclear stress EF: 47%.  Defect 1: There is a medium defect of moderate severity present in the basal inferior, mid inferior and apical inferior location. This is consistent with an inferior wall MI.  This is an intermediate risk study.  Patient Profile     61 y.o. male with a h/o hypertension, AIDS, chronic diastolic dysfunction, COPD and CRI.  He also has afib, PVD, and OSA.   Assessment & Plan    1. Shortness of breath, elective heart cath for abnormal stress test - pt presented overnight for IVF hydration in anticipation for heart catheterization today - pt is not short of breath this morning, denies chest pain, palpitations, dizziness, and feelings of syncope - Hb 8.5,  below baseline; he denies hematuria, melena, and hematemesis  - interventional team notified - ASA and plavix continued - patient refused part of heart cath prep last night  2. Acute on chronic kidney disease stage II - sCr increased overnight 1.80 (1.60), baseline appears to be 1.25-1.35 - He states that he is making urine as per his usual baseline  3. paroxysmal Afib - NSR on telemetry - continue diltiazem  4. Chronic diastolic heart failure - stable, not volume up on exam  5. COPD - stable, on room air - smoking cessation encouraged  6. HTN - stable on current meds, sBP 110-110s  7. OSA - pt refused CPAP last evening  Interventional team notified that his Hb has dropped compared to 08/17/16 (10.4) and that his sCr bumped overnight to 1.80 from 1.60. He does not have any signs of bleeding and is not symptomatic. Will await recommendations for proceeding with heart cath today. Pt remains NPO.    Signed, Ledora Bottcher , PA-C 9:21 AM 10/23/2016 Pager: [phone]  I have seen and examined the patient along with Ledora Bottcher , PA-C.  I have reviewed the chart, notes and new data.  I agree with PA's note.  Key new complaints: He denies angina, dyspnea, recent bleeding Key examination changes: No overt evidence of hypo-or hypervolemia Key new findings / data: His creatinine of 1.8 is pretty much in the middle of the range that it has oscillated over the last 12 months. It is nominally higher than the creatinine from yesterday, but I don't think that is a meaningful change. Similarly although his hemoglobin is lower than the previous value, it is not much lower than his usual hemoglobin which is in the mid 9 range.  PLAN: No overt bleeding. Normocytic anemia deserves evaluation, could be related to chronic HIV infection or its treatment, renal insufficiency as well as chronic iron deficiency related to use of antiplatelet/anticoagulant.  Sanda Klein, MD, [place] [phone] 10/23/2016, 11:35 AM

## 2016-10-23 NOTE — Telephone Encounter (Signed)
Returned call to patient who is currently in the hospital. He was pre-admitted 9/9 for cath scheduled for 9/10 (today). He states he was supposed to have cath at 730 this AM and he has not had procedure. Advised he inquire of status of procedure time from his floor nurse, which he states he has done. Apologized that I do not have a real time update on the status of when his cath will be, as I am in the office. Informed him that the nurse should be able to contact cath lab for status update.

## 2016-10-23 NOTE — Telephone Encounter (Signed)
New message    Pt is calling asking for a call back. He has questions about the procedure he was supposed to have today. Please call.

## 2016-10-24 ENCOUNTER — Encounter (HOSPITAL_COMMUNITY): Payer: Self-pay | Admitting: Cardiology

## 2016-10-26 ENCOUNTER — Ambulatory Visit: Payer: Medicaid Other | Admitting: Physician Assistant

## 2016-10-27 ENCOUNTER — Telehealth: Payer: Self-pay | Admitting: Internal Medicine

## 2016-10-27 NOTE — Telephone Encounter (Signed)
Returned call to patient's wife.She stated she wanted to ask Dr.Hilty which aspirin dose husband needs to be taking.Stated dose has been changed several times and she just wanted to make sure he takes correct dose.Message sent to Dr.Hilty for advice

## 2016-10-27 NOTE — Telephone Encounter (Signed)
New message      Pt c/o medication issue:  1. Name of Medication: aspirin 2. How are you currently taking this medication (dosage and times per day)? 81mg  3. Are you having a reaction (difficulty breathing--STAT)? no 4. What is your medication issue?  Calling to make sure pt is to be on 81mg  instead of the higher dosage.  Pt was just changed to the higher dosage and now back down to the 81mg .  Please call

## 2016-10-30 NOTE — Telephone Encounter (Signed)
81 mg daily is sufficient. Thanks.  Dr. Lemmie Evens

## 2016-10-30 NOTE — Telephone Encounter (Signed)
Returned call to patient's wife.Dr.Hilty's recommendations given.

## 2016-11-02 ENCOUNTER — Encounter: Payer: Self-pay | Admitting: Infectious Disease

## 2016-11-02 ENCOUNTER — Ambulatory Visit (INDEPENDENT_AMBULATORY_CARE_PROVIDER_SITE_OTHER): Payer: Medicaid Other | Admitting: Infectious Disease

## 2016-11-02 VITALS — BP 129/76 | HR 63 | Temp 98.5°F | Ht 72.0 in | Wt 172.0 lb

## 2016-11-02 DIAGNOSIS — Z23 Encounter for immunization: Secondary | ICD-10-CM | POA: Diagnosis not present

## 2016-11-02 DIAGNOSIS — B20 Human immunodeficiency virus [HIV] disease: Secondary | ICD-10-CM | POA: Diagnosis not present

## 2016-11-02 DIAGNOSIS — I5033 Acute on chronic diastolic (congestive) heart failure: Secondary | ICD-10-CM | POA: Diagnosis not present

## 2016-11-02 DIAGNOSIS — Z720 Tobacco use: Secondary | ICD-10-CM

## 2016-11-02 DIAGNOSIS — I739 Peripheral vascular disease, unspecified: Secondary | ICD-10-CM | POA: Diagnosis not present

## 2016-11-02 NOTE — Progress Notes (Signed)
Chief complaint: Follow-up for care for his HIV.   Subjective:    Patient ID: Kevin [name], male    DOB: March 19, 1955, 61 y.o.   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 Apparently per [employer] officers documentation the patient was on interested in engaging in care at that time when they visited him on several occasions. He apparently The diagnosis a secret in the interim has married his wife who came with him today and father child with her. She tested negative during her pregnancy with their child. We are retesting her today.   In any case he tested positive via prompting of the best practice alert for CDC recommendations of patient's 16-65 have not an HIV test in her system. This performed when he came in for catheterization and workup for his peripheral vascular disease.  I went over 3 regimens that we could start N/A "test and treat model were we do not have information on his genotype his genetics his hepatitis B coinfection status and he agreed to start [facility] which we have sent to [facility].   His genotype came back with wild type virus he did have hepatitis B coinfection bar load was 7444 count was 740. We started him on BIKTARVY and he claims he is being adherent with this medications  Virus came under control.  No results found for: HIV1RNAQUANT   Lab Results  Component Value Date   CD4TABS 910 08/17/2016   CD4TABS 740 07/03/2016   He has been since treated for a finger infection. He was also hospitalized at [facility] for unstable angina. He underwent cardiac cath which apparently showed normal coronary arteries and LV function.  He  returns for followup with [name] today and states he has missed zero doses of his BIKTARVY.   Past Medical History:  Diagnosis Date  . AIDS (acquired immune deficiency syndrome) ([place]) 08/17/2016  . Chronic diastolic CHF (congestive heart failure), NYHA class 3 ([place]) 01/2016  . Chronic lower back pain   . CKD (chronic kidney disease), stage III   . COPD (chronic obstructive pulmonary disease) ([place])   . Gout    "forearms, hands, ankles, feet" (06/05/2016)  . Headache    "weekly" (06/05/2016)  . Heart murmur   . Hypertension   . OSA on CPAP   . PAD (peripheral artery disease) ([place])   . PAF (paroxysmal atrial fibrillation) ([place]) 01/2016    Past Surgical History:  Procedure Laterality Date  . LEFT HEART CATH AND CORONARY ANGIOGRAPHY N/A 10/23/2016   Procedure: LEFT HEART CATH AND CORONARY ANGIOGRAPHY;  Surgeon: Leonie Man, MD;  Location: [facility];  Service: Cardiovascular;  Laterality: N/A;  . LOWER EXTREMITY ANGIOGRAPHY N/A 07/17/2016   Procedure: Lower Extremity Angiography;  Surgeon: Lorretta Harp, MD;  Location: [place] [facility];  Service: Cardiovascular;  Laterality: N/A;  . LOWER EXTREMITY INTERVENTION N/A 06/05/2016   Procedure: Lower Extremity Intervention;  Surgeon: Lorretta Harp, MD;  Location: [facility];  Service: Cardiovascular;  Laterality: N/A;  . PERIPHERAL VASCULAR ATHERECTOMY Right 07/17/2016   Procedure: Peripheral Vascular Atherectomy;  Surgeon: Lorretta Harp, MD;  Location: [place];  Service: Cardiovascular;  Laterality: Right;  SFA  . PERIPHERAL VASCULAR INTERVENTION  06/05/2016   Procedure: Peripheral Vascular Intervention;  Surgeon: Lorretta Harp, MD;  Location: [facility];  Service: Cardiovascular;;  left SFA    Family History  Problem Relation Age of Onset  . High blood pressure Mother   . Lupus Mother       Social History   Social History  . Marital status: Divorced    Spouse name: N/A  . Number of  children: N/A  . Years of education: N/A   Social History Main Topics  . Smoking status: Current Every Day Smoker    Packs/day: 0.25    Years: 42.00    Types: Cigarettes  . Smokeless tobacco: Never Used  . Alcohol use 1.2 oz/week    2 Cans of beer per week  . Drug use: No  . Sexual activity: Not Currently   Other Topics Concern  . None   Social History Narrative  . None    No Known Allergies   Current Outpatient Prescriptions:  .  aspirin 81 MG EC tablet, Take 1 tablet (81 mg total) by mouth daily., Disp: 30 tablet, Rfl: 12 .  bictegravir-emtricitabine-tenofovir AF (BIKTARVY) 50-200-25 MG TABS tablet, Take 1 tablet by mouth daily. (Patient taking differently: Take 1 tablet by mouth at bedtime. ), Disp: 30 tablet, Rfl: 11 .  CARTIA XT 180 MG 24 hr capsule, TAKE ONE CAPSULE BY MOUTH DAILY, Disp: 30 capsule, Rfl: 11 .  carvedilol (COREG) 12.5 MG tablet, Take 1 tablet (12.5 mg total) by mouth 2 (two) times daily with a meal. (Patient taking differently: Take 12.5 mg by mouth 2 (two) times daily. ), Disp: 60 tablet, Rfl: 3 .  clopidogrel (PLAVIX) 75 MG tablet, Take 1 tablet (75 mg total) by mouth daily with breakfast. (Patient taking differently: Take 75 mg by mouth at bedtime. ), Disp: 90 tablet, Rfl: 3 .  furosemide (LASIX) 40 MG tablet, TAKE 1 TABLET BY MOUTH DAILY (Patient taking differently: TAKE 1 TABLET BY MOUTH DAILY AT BEDTIME.), Disp: 30 tablet, Rfl: 11 .  hydrALAZINE (APRESOLINE) 50 MG tablet, TAKE 1 tablet (50 MG) EVERY 8 HOURS (Patient taking differently: Take 50 mg by mouth 3 (three) times daily. TAKE 1 tablet (50 MG) EVERY 8 HOURS), Disp: 135 tablet, Rfl: 5 .  HYDROcodone-acetaminophen (NORCO) 10-325 MG tablet, Take 1 tablet by mouth every 8 (eight) hours as needed. (Patient taking differently: Take 1 tablet by mouth every 8 (eight) hours as needed (for pain.). ), Disp: 20 tablet, Rfl: 0 .  isosorbide mononitrate (IMDUR) 30 MG 24 hr tablet, TAKE 1 TABLET BY MOUTH DAILY,  Disp: 30 tablet, Rfl: 11 .  LYRICA 150 MG capsule, Take 150 mg by mouth at bedtime. , Disp: , Rfl: 3 .  potassium chloride (K-DUR) 10 MEQ tablet, Take 2 tablets (20 mEq total) by mouth daily. (Patient taking differently: Take 20 mEq by mouth at bedtime. ), Disp: 180 tablet, Rfl: 3 .  traZODone (DESYREL) 100 MG tablet, Take 100 mg by mouth at bedtime. , Disp: , Rfl:  .  DULERA 100-5 MCG/ACT AERO, INHALE 2 PUFFS INTO THE LUNGS TWICE DAILY (Patient not taking: Reported on 11/02/2016), Disp: 13 g, Rfl: 0 .  PROAIR HFA 108 (90 Base) MCG/ACT inhaler, INHALE 2 PUFFS INTO THE LUNGS EVERY 6 HOURS AS NEEDED FOR WHEEZING OR SHORTNESS OF BREATH, Disp: 8.5 g, Rfl: 0 .  SPIRIVA HANDIHALER 18 MCG inhalation capsule, INHALE CONTENTS OF 1 CAPSULE ONCE DAILY USING HANDIHALER (Patient  not taking: Reported on 11/02/2016), Disp: 30 capsule, Rfl: 5    Review of Systems  Constitutional: Negative for chills and fever.  Respiratory: Negative for cough, shortness of breath and wheezing.   Cardiovascular: Negative for chest pain, palpitations and leg swelling.  Gastrointestinal: Negative for abdominal pain.  Genitourinary: Negative for dysuria, flank pain and hematuria.  Musculoskeletal: Negative for back pain and myalgias.  Skin: Negative for rash.  Neurological: Negative for dizziness and headaches.  Hematological: Does not bruise/bleed easily.  Psychiatric/Behavioral: Negative for suicidal ideas.       Objective:   Physical Exam  Constitutional: He is oriented to person, place, and time. No distress.  HENT:  Head: Normocephalic and atraumatic.  Mouth/Throat: No oropharyngeal exudate.  Eyes: Conjunctivae and EOM are normal. No scleral icterus.  Neck: Normal range of motion. Neck supple.  Cardiovascular: Normal rate, regular rhythm and intact distal pulses.  Exam reveals no gallop and no friction rub.   No murmur heard. Pulmonary/Chest: Effort normal and breath sounds normal. No respiratory distress. He has no  wheezes.  Abdominal: Soft. He exhibits no distension.  Musculoskeletal: He exhibits no edema or tenderness.  Neurological: He is alert and oriented to person, place, and time. He exhibits normal muscle tone. Coordination normal.  Skin: Skin is warm and dry. No rash noted. He is not diaphoretic. No erythema. No pallor.  Psychiatric: His speech is normal and behavior is normal. Judgment and thought content normal. His affect is not blunt.          Assessment & Plan:   HIV disease: continue BIKTARVY and will check labs in a few months  Finish Hep A and B vaccines in January  PAD being followed closely by [facility]  CHF followed by Cardiology, HF and by primary care he does not have ischemic heart disease by cath  Smoking: encouraged cessation. He claims to be down to 5 cigarettes

## 2016-11-08 ENCOUNTER — Ambulatory Visit: Payer: Medicaid Other | Admitting: Physician Assistant

## 2016-11-13 ENCOUNTER — Ambulatory Visit (INDEPENDENT_AMBULATORY_CARE_PROVIDER_SITE_OTHER): Payer: Medicaid Other | Admitting: Physician Assistant

## 2016-11-13 ENCOUNTER — Encounter: Payer: Self-pay | Admitting: Physician Assistant

## 2016-11-13 VITALS — BP 168/92 | HR 58 | Ht 72.0 in | Wt 171.0 lb

## 2016-11-13 DIAGNOSIS — N183 Chronic kidney disease, stage 3 unspecified: Secondary | ICD-10-CM

## 2016-11-13 DIAGNOSIS — I739 Peripheral vascular disease, unspecified: Secondary | ICD-10-CM

## 2016-11-13 DIAGNOSIS — J989 Respiratory disorder, unspecified: Secondary | ICD-10-CM

## 2016-11-13 DIAGNOSIS — I10 Essential (primary) hypertension: Secondary | ICD-10-CM

## 2016-11-13 DIAGNOSIS — I480 Paroxysmal atrial fibrillation: Secondary | ICD-10-CM | POA: Diagnosis not present

## 2016-11-13 DIAGNOSIS — D649 Anemia, unspecified: Secondary | ICD-10-CM

## 2016-11-13 DIAGNOSIS — B20 Human immunodeficiency virus [HIV] disease: Secondary | ICD-10-CM | POA: Diagnosis not present

## 2016-11-13 LAB — CBC
HEMATOCRIT: 29.2 % — AB (ref 37.5–51.0)
HEMOGLOBIN: 10 g/dL — AB (ref 13.0–17.7)
MCH: 31.9 pg (ref 26.6–33.0)
MCHC: 34.2 g/dL (ref 31.5–35.7)
MCV: 93 fL (ref 79–97)
Platelets: 369 10*3/uL (ref 150–379)
RBC: 3.13 x10E6/uL — ABNORMAL LOW (ref 4.14–5.80)
RDW: 14.5 % (ref 12.3–15.4)
WBC: 7 10*3/uL (ref 3.4–10.8)

## 2016-11-13 LAB — BASIC METABOLIC PANEL
BUN/Creatinine Ratio: 7 — ABNORMAL LOW (ref 9–20)
BUN: 10 mg/dL (ref 6–24)
CO2: 24 mmol/L (ref 20–29)
CREATININE: 1.39 mg/dL — AB (ref 0.76–1.27)
Calcium: 9.1 mg/dL (ref 8.7–10.2)
Chloride: 107 mmol/L — ABNORMAL HIGH (ref 96–106)
GFR, EST AFRICAN AMERICAN: 64 mL/min/{1.73_m2} (ref >59)
GFR, EST NON AFRICAN AMERICAN: 55 mL/min/{1.73_m2} — AB (ref >59)
Glucose: 80 mg/dL (ref 65–99)
Potassium: 4.1 mmol/L (ref 3.5–5.2)
Sodium: 140 mmol/L (ref 134–144)

## 2016-11-13 NOTE — Patient Instructions (Addendum)
Medication Instructions:   No changes to medications  Labwork:   CBC, BMET today  Testing/Procedures:  none  Follow-Up:  With Dr. Debara Pickett in 4-5 months. You will receive a letter in the mail when it is time to schedule your appointment, with instructions to call our office.   If you need a refill on your cardiac medications before your next appointment, please call your pharmacy.

## 2016-11-13 NOTE — Progress Notes (Signed)
Cardiology Office Note    Date:  11/14/2016   ID:  AVRAM DANIELSON, DOB [birth year], MRN [id number]  PCP:  [name], DO  Cardiologist:  Dr. Debara Pickett Nephrologist: [name]  Chief Complaint  Patient presents with  . Follow-up    [facility]. PT HAS NO COMPLAINTS TODAY     History of Present Illness:  Kevin [name] is a 61 y.o. male with PMH of AIDS, CKD, chronic diastolic heart failure, HTN, PAD with stent to SFA and PAF. Last echocardiogram obtained on 02/09/2016 showed EF 65-70%, grade 1 DD, moderate LVH. He had a Myoview in February 2018 showed EF 47%, medium-sized moderate severity and inferior defects suggestive of inferior MI. He was not aware of any prior heart attack. He has chronic lung disease likely COPD and uses inhalers. He has been recently evaluated by with worsening shortness of breath. Given persistent symptoms, he underwent cardiac catheterization on 10/24/2078 after overnight hydration. Cardiac catheterization performed on 10/23/2016 showed angiographically normal coronary arteries, left dominant system, LVEF 55-65%. Normal LV function with mildly elevated LVEDP. He was discharged on the same day.  He presents today for follow-up. He continued to have baseline shortness of breath, this is very unlikely to be cardiac in nature given the negative cardiac catheterization. Despite recent mildly elevated LVEDP, he does not appears to be volume overloaded on physical exam. However, he has diffusely diminished breath sound in bilateral lung consistent with chronic lung issue. This is likely the cause for his shortness of breath. He says he is no longer smoking as heavily as he used to, I encouraged him to stop smoking. He also mentions of the left upper thigh pain, recent ABI was stable. Given negative cardiac catheterization, I would not recommend any further workup at this time. He will need a CBC today to monitor anemia and also basic metabolic panel to make sure  his renal function stable. His blood pressure is high, however he did not take any morning medication today. I rechecked his blood pressure manually which was 160/80. He says he take his blood pressure at home every day and his BP normally remains in the 120s range. Given the moderate LVH seen on previous echocardiogram, he will need to control his blood pressure very well. I will hold off on adjusting the blood pressure medication today especially in light of normal blood pressure on the 2 recent visits.   Past Medical History:  Diagnosis Date  . AIDS (acquired immune deficiency syndrome) ([place]) 08/17/2016  . Chronic diastolic CHF (congestive heart failure), NYHA class 3 ([place]) 01/2016  . Chronic lower back pain   . CKD (chronic kidney disease), stage III ([place])   . COPD (chronic obstructive pulmonary disease) ([place])   . Gout    "forearms, hands, ankles, feet" (06/05/2016)  . Headache    "weekly" (06/05/2016)  . Heart murmur   . Hypertension   . OSA on CPAP   . PAD (peripheral artery disease) ([place])   . PAF (paroxysmal atrial fibrillation) ([place]) 01/2016    Past Surgical History:  Procedure Laterality Date  . LEFT HEART CATH AND CORONARY ANGIOGRAPHY N/A 10/23/2016   Procedure: LEFT HEART CATH AND CORONARY ANGIOGRAPHY;  Surgeon: Leonie Man, MD;  Location: [facility];  Service: Cardiovascular;  Laterality: N/A;  . LOWER EXTREMITY ANGIOGRAPHY N/A 07/17/2016   Procedure: Lower Extremity Angiography;  Surgeon: Lorretta Harp, MD;  Location: [facility];  Service: Cardiovascular;  Laterality: N/A;  .  LOWER EXTREMITY INTERVENTION N/A 06/05/2016   Procedure: Lower Extremity Intervention;  Surgeon: Lorretta Harp, MD;  Location: [facility];  Service: Cardiovascular;  Laterality: N/A;  . PERIPHERAL VASCULAR ATHERECTOMY Right 07/17/2016   Procedure: Peripheral Vascular Atherectomy;  Surgeon: Lorretta Harp, MD;  Location: [facility];  Service: Cardiovascular;   Laterality: Right;  SFA  . PERIPHERAL VASCULAR INTERVENTION  06/05/2016   Procedure: Peripheral Vascular Intervention;  Surgeon: Lorretta Harp, MD;  Location: [facility];  Service: Cardiovascular;;  left SFA    Current Medications: Outpatient Medications Prior to Visit  Medication Sig Dispense Refill  . aspirin 81 MG EC tablet Take 1 tablet (81 mg total) by mouth daily. 30 tablet 12  . bictegravir-emtricitabine-tenofovir AF (BIKTARVY) 50-200-25 MG TABS tablet Take 1 tablet by mouth daily. (Patient taking differently: Take 1 tablet by mouth at bedtime. ) 30 tablet 11  . CARTIA XT 180 MG 24 hr capsule TAKE ONE CAPSULE BY MOUTH DAILY 30 capsule 11  . carvedilol (COREG) 12.5 MG tablet Take 1 tablet (12.5 mg total) by mouth 2 (two) times daily with a meal. (Patient taking differently: Take 12.5 mg by mouth 2 (two) times daily. ) 60 tablet 3  . clopidogrel (PLAVIX) 75 MG tablet Take 1 tablet (75 mg total) by mouth daily with breakfast. (Patient taking differently: Take 75 mg by mouth at bedtime. ) 90 tablet 3  . DULERA 100-5 MCG/ACT AERO INHALE 2 PUFFS INTO THE LUNGS TWICE DAILY 13 g 0  . furosemide (LASIX) 40 MG tablet TAKE 1 TABLET BY MOUTH DAILY (Patient taking differently: TAKE 1 TABLET BY MOUTH DAILY AT BEDTIME.) 30 tablet 11  . hydrALAZINE (APRESOLINE) 50 MG tablet TAKE 1 tablet (50 MG) EVERY 8 HOURS (Patient taking differently: Take 50 mg by mouth 3 (three) times daily. TAKE 1 tablet (50 MG) EVERY 8 HOURS) 135 tablet 5  . HYDROcodone-acetaminophen (NORCO) 10-325 MG tablet Take 1 tablet by mouth every 8 (eight) hours as needed. (Patient taking differently: Take 1 tablet by mouth every 8 (eight) hours as needed (for pain.). ) 20 tablet 0  . isosorbide mononitrate (IMDUR) 30 MG 24 hr tablet TAKE 1 TABLET BY MOUTH DAILY 30 tablet 11  . LYRICA 150 MG capsule Take 150 mg by mouth at bedtime.   3  . potassium chloride (K-DUR) 10 MEQ tablet Take 2 tablets (20 mEq total) by mouth daily. (Patient  taking differently: Take 20 mEq by mouth at bedtime. ) 180 tablet 3  . PROAIR HFA 108 (90 Base) MCG/ACT inhaler INHALE 2 PUFFS INTO THE LUNGS EVERY 6 HOURS AS NEEDED FOR WHEEZING OR SHORTNESS OF BREATH 8.5 g 0  . SPIRIVA HANDIHALER 18 MCG inhalation capsule INHALE CONTENTS OF 1 CAPSULE ONCE DAILY USING HANDIHALER 30 capsule 5  . traZODone (DESYREL) 100 MG tablet Take 100 mg by mouth at bedtime.      No facility-administered medications prior to visit.      Allergies:   Patient has no known allergies.   Social History   Social History  . Marital status: Divorced    Spouse name: N/A  . Number of children: N/A  . Years of education: N/A   Social History Main Topics  . Smoking status: Current Every Day Smoker    Packs/day: 0.25    Years: 42.00    Types: Cigarettes  . Smokeless tobacco: Never Used  . Alcohol use 1.2 oz/week    2 Cans of beer per week  .  Drug use: No  . Sexual activity: Not Currently   Other Topics Concern  . None   Social History Narrative  . None     Family History:  The patient's family history includes High blood pressure in his mother; Lupus in his mother.   ROS:   Please see the history of present illness.    ROS All other systems reviewed and are negative.   PHYSICAL EXAM:   VS:  BP (!) 168/92 (BP Location: Right Arm, Patient Position: Sitting, Cuff Size: Normal)   Pulse (!) 58   Ht 6' (1.829 m)   Wt 171 lb (77.6 kg)   SpO2 100%   BMI 23.19 kg/m    GEN: Well nourished, well developed, in no acute distress  HEENT: normal  Neck: no JVD, carotid bruits, or masses Cardiac: RRR; no murmurs, rubs, or gallops,no edema  Respiratory: Moderate decrease in breath sound bilaterally, no wheezing, no rhonchi and no crackle GI: soft, nontender, nondistended, + BS MS: no deformity or atrophy  Skin: warm and dry, no rash Neuro:  Alert and Oriented x 3, Strength and sensation are intact Psych: euthymic mood, full affect  Wt Readings from Last 3  Encounters:  11/13/16 171 lb (77.6 kg)  11/02/16 172 lb (78 kg)  10/23/16 168 lb 14.4 oz (76.6 kg)      Studies/Labs Reviewed:   EKG:  EKG is not ordered today.   Recent Labs: 02/07/2016: B Natriuretic Peptide 208.6 02/09/2016: Magnesium 1.9 06/01/2016: TSH 0.48 08/17/2016: ALT 14 11/13/2016: BUN 10; Creatinine, Ser 1.39; Hemoglobin 10.0; Platelets 369; Potassium 4.1; Sodium 140   Lipid Panel    Component Value Date/Time   CHOL 156 07/17/2016 0226   TRIG 123 07/17/2016 0226   HDL 50 07/17/2016 0226   CHOLHDL 3.1 07/17/2016 0226   VLDL 25 07/17/2016 0226   LDLCALC 81 07/17/2016 0226    Additional studies/ records that were reviewed today include:   Cath 10/23/2016 Conclusion     Angiographically normal coronary arteries. Left dominant system  Normal LV function with mildly elevated LVEDP  The left ventricular ejection fraction is 55-65% by visual estimate.   With normal LV function, LVEDP and normal coronaries, would recommend evaluating for noncardiac etiology for dyspnea.  Plan: Return to nursing unit for TR band removal and at least 4 hours of IV hydration given his renal insufficiency.        ASSESSMENT:    1. Chronic respiratory disease   2. Stage 3 chronic kidney disease ([place])   3. Anemia, unspecified type   4. Essential hypertension   5. PAD (peripheral artery disease) ([place])   6. PAF (paroxysmal atrial fibrillation) ([place])   7. HIV (human immunodeficiency virus infection) ([place])      PLAN:  In order of problems listed above:  1. Chronic respiratory failure: diffusely diminished breath sounds bilaterally. This is likely the main issue that is causing his recent shortness of breath at baseline. Recent cardiac catheterization showed normal coronaries, mildly elevated LVEDP, however he does not appear to be volume overloaded on physical exam.  2. PAD: Recent ABI in June 2018 stable, no further workup  3. Hypertension: Blood pressure well  controlled.  4. PAF: CHA2DS2-Vasc score 1 (HTN), on ASA. Continue carvedilol and Cartia XT  5. CKD stage III: Recent AKI, obtain basic metabolic panel today.  6. HIV: Followed by infectious disease, [name]  7. Anemia: Obtain CBC    Medication Adjustments/Labs and Tests Ordered: Current medicines are reviewed  at length with the patient today.  Concerns regarding medicines are outlined above.  Medication changes, Labs and Tests ordered today are listed in the Patient Instructions below. Patient Instructions  Medication Instructions:   No changes to medications  Labwork:   CBC, BMET today  Testing/Procedures:  none  Follow-Up:  With Dr. Debara Pickett in 4-5 months. You will receive a letter in the mail when it is time to schedule your appointment, with instructions to call our office.   If you need a refill on your cardiac medications before your next appointment, please call your pharmacy.      Hilbert Corrigan, [name]  11/14/2016 10:34 PM    [facility] [address], [place], [place]  [address] Phone: [phone]; Fax: [phone]

## 2016-11-14 ENCOUNTER — Telehealth: Payer: Self-pay | Admitting: Physician Assistant

## 2016-11-14 ENCOUNTER — Encounter: Payer: Self-pay | Admitting: Physician Assistant

## 2016-11-14 NOTE — Telephone Encounter (Signed)
Please call,she was unable to come with pt for office visit. She have some questions about his office visit yesterday.

## 2016-11-14 NOTE — Telephone Encounter (Signed)
Results and recommendations discussed with patient's companion Sherlyn Hay (DPR), who verbalized understanding and thanks. All questions addressed to her satisfaction.

## 2016-11-28 ENCOUNTER — Ambulatory Visit: Payer: Medicaid Other

## 2016-12-08 ENCOUNTER — Telehealth: Payer: Self-pay | Admitting: Internal Medicine

## 2016-12-08 NOTE — Telephone Encounter (Signed)
Kevin [name] called from [facility] and is requesting verbal orders for PT.  1 time for 1 week and 2 times for 4 weeks. Please call her at [phone]. She said that we can leave a voicemail  Since this is HIPPA compliant. jw

## 2016-12-11 NOTE — Telephone Encounter (Signed)
VM left for verbal orders for PT.   Phill Myron, D.O. 12/11/2016, 10:20 AM PGY-3, [place]

## 2016-12-25 ENCOUNTER — Other Ambulatory Visit: Payer: Self-pay

## 2016-12-25 ENCOUNTER — Emergency Department (HOSPITAL_COMMUNITY): Payer: Medicaid Other

## 2016-12-25 ENCOUNTER — Emergency Department (HOSPITAL_COMMUNITY)
Admission: EM | Admit: 2016-12-25 | Discharge: 2016-12-25 | Disposition: A | Discharge status: Home / Self Care | Payer: Medicaid Other | Attending: Emergency Medicine | Admitting: Emergency Medicine

## 2016-12-25 ENCOUNTER — Encounter (HOSPITAL_COMMUNITY): Payer: Self-pay

## 2016-12-25 DIAGNOSIS — N183 Chronic kidney disease, stage 3 (moderate): Secondary | ICD-10-CM | POA: Insufficient documentation

## 2016-12-25 DIAGNOSIS — B20 Human immunodeficiency virus [HIV] disease: Secondary | ICD-10-CM | POA: Insufficient documentation

## 2016-12-25 DIAGNOSIS — J449 Chronic obstructive pulmonary disease, unspecified: Secondary | ICD-10-CM | POA: Diagnosis not present

## 2016-12-25 DIAGNOSIS — Z79899 Other long term (current) drug therapy: Secondary | ICD-10-CM | POA: Diagnosis not present

## 2016-12-25 DIAGNOSIS — I10 Essential (primary) hypertension: Secondary | ICD-10-CM

## 2016-12-25 DIAGNOSIS — R51 Headache: Secondary | ICD-10-CM | POA: Diagnosis present

## 2016-12-25 DIAGNOSIS — F17210 Nicotine dependence, cigarettes, uncomplicated: Secondary | ICD-10-CM | POA: Insufficient documentation

## 2016-12-25 DIAGNOSIS — G8929 Other chronic pain: Secondary | ICD-10-CM | POA: Diagnosis not present

## 2016-12-25 DIAGNOSIS — Z7902 Long term (current) use of antithrombotics/antiplatelets: Secondary | ICD-10-CM | POA: Insufficient documentation

## 2016-12-25 DIAGNOSIS — I2510 Atherosclerotic heart disease of native coronary artery without angina pectoris: Secondary | ICD-10-CM | POA: Diagnosis not present

## 2016-12-25 DIAGNOSIS — Z7982 Long term (current) use of aspirin: Secondary | ICD-10-CM | POA: Diagnosis not present

## 2016-12-25 DIAGNOSIS — R519 Headache, unspecified: Secondary | ICD-10-CM

## 2016-12-25 DIAGNOSIS — I5033 Acute on chronic diastolic (congestive) heart failure: Secondary | ICD-10-CM | POA: Insufficient documentation

## 2016-12-25 DIAGNOSIS — I130 Hypertensive heart and chronic kidney disease with heart failure and stage 1 through stage 4 chronic kidney disease, or unspecified chronic kidney disease: Secondary | ICD-10-CM | POA: Diagnosis not present

## 2016-12-25 LAB — CBC WITH DIFFERENTIAL/PLATELET
Basophils Absolute: 0 10*3/uL (ref 0.0–0.1)
Basophils Relative: 0 %
EOS ABS: 0.2 10*3/uL (ref 0.0–0.7)
EOS PCT: 2 %
HCT: 30.7 % — ABNORMAL LOW (ref 39.0–52.0)
Hemoglobin: 10.3 g/dL — ABNORMAL LOW (ref 13.0–17.0)
LYMPHS ABS: 1.2 10*3/uL (ref 0.7–4.0)
Lymphocytes Relative: 16 %
MCH: 31.5 pg (ref 26.0–34.0)
MCHC: 33.6 g/dL (ref 30.0–36.0)
MCV: 93.9 fL (ref 78.0–100.0)
Monocytes Absolute: 0.4 10*3/uL (ref 0.1–1.0)
Monocytes Relative: 5 %
Neutro Abs: 5.8 10*3/uL (ref 1.7–7.7)
Neutrophils Relative %: 77 %
PLATELETS: 308 10*3/uL (ref 150–400)
RBC: 3.27 MIL/uL — AB (ref 4.22–5.81)
RDW: 14.7 % (ref 11.5–15.5)
WBC: 7.5 10*3/uL (ref 4.0–10.5)

## 2016-12-25 LAB — BASIC METABOLIC PANEL
Anion gap: 3 — ABNORMAL LOW (ref 5–15)
BUN: 10 mg/dL (ref 6–20)
CHLORIDE: 111 mmol/L (ref 101–111)
CO2: 26 mmol/L (ref 22–32)
Calcium: 8.9 mg/dL (ref 8.9–10.3)
Creatinine, Ser: 1.5 mg/dL — ABNORMAL HIGH (ref 0.61–1.24)
GFR calc Af Amer: 58 mL/min — ABNORMAL LOW (ref >60)
GFR calc non Af Amer: 50 mL/min — ABNORMAL LOW (ref >60)
GLUCOSE: 100 mg/dL — AB (ref 65–99)
POTASSIUM: 3.9 mmol/L (ref 3.5–5.1)
SODIUM: 140 mmol/L (ref 135–145)

## 2016-12-25 MED ORDER — MORPHINE SULFATE (PF) 4 MG/ML IV SOLN
4.0000 mg | Freq: Once | INTRAVENOUS | Status: AC
  Administered 2016-12-25: 4 mg via INTRAMUSCULAR
  Filled 2016-12-25: qty 1

## 2016-12-25 MED ORDER — CARVEDILOL 12.5 MG PO TABS: 12.5000 mg | ORAL_TABLET | Freq: Once | ORAL | Status: DC

## 2016-12-25 MED ORDER — HYDRALAZINE HCL 25 MG PO TABS
50.0000 mg | ORAL_TABLET | Freq: Once | ORAL | Status: AC
  Administered 2016-12-25: 50 mg via ORAL
  Filled 2016-12-25: qty 2

## 2016-12-25 MED ORDER — DILTIAZEM HCL ER COATED BEADS 180 MG PO CP24
180.0000 mg | ORAL_CAPSULE | Freq: Once | ORAL | Status: DC
  Filled 2016-12-25: qty 1

## 2016-12-25 MED ORDER — HYDROCODONE-ACETAMINOPHEN 5-325 MG PO TABS
1.0000 | ORAL_TABLET | Freq: Once | ORAL | Status: AC
  Administered 2016-12-25: 1 via ORAL
  Filled 2016-12-25: qty 1

## 2016-12-25 NOTE — ED Notes (Signed)
Pt and family very upset pt is in the hall with lights and noise making HA worse.  Pt continues to rate pain 10/10, no relief from meds

## 2016-12-25 NOTE — ED Notes (Signed)
Pt unwilling to wait at this time for meds. Or recheck of BP, pt walking from departement stating he will follow up with his PCP

## 2016-12-25 NOTE — ED Notes (Signed)
Patient transported to CT 

## 2016-12-25 NOTE — ED Provider Notes (Signed)
[facility] EMERGENCY DEPARTMENT Provider Note   CSN: [id number] Arrival date & time: 12/25/16  1358     History   Chief Complaint Chief Complaint  Patient presents with  . Headache    HPI Kevin [name] is a 61 y.o. male.  61 year old male presents with complaint of headache. Symptoms started about 6 days prior. He denies fever, neck stiffness/pain, nausea, vomiting, chest pain, shortness of breath, or other acute complaint. He reports that he does not normally get headaches that last this long. He denies weakness, visual change, or speech change. He does report elevated BP at home - he admits that he did not take his BP medication (or anything else) today.    The history is provided by the patient and a relative.  Headache   This is a new problem. The current episode started more than 2 days ago. The problem occurs hourly. The problem has not changed since onset.The headache is associated with nothing. The pain is located in the bilateral and frontal region. The pain is mild. The pain does not radiate. Pertinent negatives include no anorexia, no fever, no chest pressure, no near-syncope, no orthopnea, no palpitations, no syncope, no shortness of breath, no nausea and no vomiting.    Past Medical History:  Diagnosis Date  . AIDS (acquired immune deficiency syndrome) ([place]) 08/17/2016  . Chronic diastolic CHF (congestive heart failure), NYHA class 3 ([place]) 01/2016  . Chronic lower back pain   . CKD (chronic kidney disease), stage III ([place])   . COPD (chronic obstructive pulmonary disease) ([place])   . Gout    "forearms, hands, ankles, feet" (06/05/2016)  . Headache    "weekly" (06/05/2016)  . Heart murmur   . Hypertension   . OSA on CPAP   . PAD (peripheral artery disease) ([place])   . PAF (paroxysmal atrial fibrillation) ([place]) 01/2016    Patient Active Problem List   Diagnosis Date Noted  . Coronary artery disease involving native coronary artery of native heart  with unstable angina pectoris ([place]) 10/19/2016  . Easy bruising 08/11/2016  . Claudication in peripheral vascular disease ([place]) 06/05/2016  . PVD (peripheral vascular disease) ([place]) 06/04/2016  . Stage 3 chronic kidney disease ([place])   . Peripheral arterial disease ([place]) 05/09/2016  . Lipid screening 05/01/2016  . Acute on chronic diastolic CHF (congestive heart failure), NYHA class 3 ([place]) 03/24/2016  . OSA (obstructive sleep apnea) 03/24/2016  . Weakness 02/24/2016  . Uncontrolled hypertension 02/18/2016  . Leg pain 02/18/2016  . Sleepiness 02/18/2016  . 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 Dyspnea 02/08/2016  . Shortness of breath 02/07/2016  . PAF (paroxysmal atrial fibrillation) ([place]) 02/07/2016  . Tobacco abuse 02/07/2016  . Acute on chronic renal insufficiency 02/07/2016  . COPD (chronic obstructive pulmonary disease) ([place]) 02/07/2016  . Hypertensive emergency 02/07/2016  . Blurred vision, bilateral   . HIV disease ([place]) 08/27/2004    History reviewed. No pertinent surgical history.     Home Medications    Prior to Admission medications   Medication Sig Start Date End Date Taking? Authorizing Provider  aspirin 81 MG EC tablet Take 1 tablet (81 mg total) by mouth daily. 07/18/16  Yes Reino Bellis B, NP  bictegravir-emtricitabine-tenofovir AF (BIKTARVY) 50-200-25 MG TABS tablet Take 1 tablet by mouth daily. 07/03/16  Yes [name], [name], MD  CARTIA XT 180 MG 24 hr capsule TAKE ONE CAPSULE BY MOUTH DAILY 03/13/16  Yes Hilty, Nadean Corwin, MD  carvedilol (COREG) 12.5 MG tablet Take 1 tablet (12.5 mg total) by mouth 2 (two) times daily with a meal. Patient taking differently: Take 12.5 mg by mouth 2 (two) times daily.  08/10/16  Yes [name], DO  clopidogrel (PLAVIX) 75 MG tablet Take 1 tablet (75 mg total) by mouth daily with breakfast. Patient taking differently: Take 75 mg by mouth at bedtime.  06/07/16  Yes Strader, Fransisco Hertz, PA-C  DULERA  100-5 MCG/ACT AERO INHALE 2 PUFFS INTO THE LUNGS TWICE DAILY 06/29/16  Yes [name], DO  furosemide (LASIX) 40 MG tablet TAKE 1 TABLET BY MOUTH DAILY Patient taking differently: TAKE 1 TABLET BY MOUTH DAILY AT BEDTIME. 03/13/16  Yes Hilty, Nadean Corwin, MD  hydrALAZINE (APRESOLINE) 50 MG tablet TAKE 1 tablet (50 MG) EVERY 8 HOURS Patient taking differently: Take 50 mg 3 (three) times daily by mouth. TAKE 1 tablet (50 MG) EVERY 8 HOURS 02/24/16  Yes Hensel, Jamal Collin, MD  HYDROcodone-acetaminophen (NORCO) 10-325 MG tablet Take 1 tablet by mouth every 8 (eight) hours as needed. Patient taking differently: Take 1 tablet every 8 (eight) hours as needed by mouth (for pain.).  07/26/16  Yes Regal, Tamala Fothergill, DPM  isosorbide mononitrate (IMDUR) 30 MG 24 hr tablet TAKE 1 TABLET BY MOUTH DAILY 03/13/16  Yes Hilty, Nadean Corwin, MD  LYRICA 150 MG capsule Take 150 mg by mouth at bedtime.  09/27/16  Yes [provider]  potassium chloride (K-DUR) 10 MEQ tablet Take 2 tablets (20 mEq total) by mouth daily. Patient taking differently: Take 20 mEq by mouth at bedtime.  03/21/16  Yes [name], MD  traZODone (DESYREL) 100 MG tablet Take 100 mg at bedtime as needed by mouth for sleep.    Yes [provider]  PROAIR HFA 108 (90 Base) MCG/ACT inhaler INHALE 2 PUFFS INTO THE LUNGS EVERY 6 HOURS AS NEEDED FOR WHEEZING OR SHORTNESS OF BREATH Patient not taking: Reported on 12/25/2016 03/15/16   [name], DO  SPIRIVA HANDIHALER 18 MCG inhalation capsule INHALE CONTENTS OF 1 CAPSULE ONCE DAILY USING HANDIHALER Patient not taking: Reported on 12/25/2016 09/05/16   [name], DO    Family History Family History  Problem Relation Age of Onset  . High blood pressure Mother   . Lupus Mother     Social History Social History   Tobacco Use  . Smoking status: Current Every Day Smoker    Packs/day: 0.25    Years: 42.00    Pack years: 10.50    Types: Cigarettes   . Smokeless tobacco: Never Used  Substance Use Topics  . Alcohol use: Yes    Alcohol/week: 1.2 oz    Types: 2 Cans of beer per week  . Drug use: No     Allergies   Patient has no known allergies.   Review of Systems Review of Systems  Constitutional: Negative for fever.  Respiratory: Negative for shortness of breath.   Cardiovascular: Negative for palpitations, orthopnea, syncope and near-syncope.  Gastrointestinal: Negative for anorexia, nausea and vomiting.  Neurological: Positive for headaches.     Physical Exam Updated Vital Signs BP (!) 156/80 (BP Location: Left Arm)   Pulse 70   Temp 99 F (37.2 C) (Oral)   Resp 16   Ht 6' (1.829 m)   Wt 77.1 kg (170 lb)   SpO2 100%   BMI 23.06 kg/m   Physical Exam  Constitutional: He is oriented to person, place, and time. He  appears well-developed and well-nourished. He does not appear ill.  HENT:  Head: Normocephalic and atraumatic.  Mouth/Throat: Oropharynx is clear and moist.  Eyes: Conjunctivae and EOM are normal. Pupils are equal, round, and reactive to light.  Neck: Normal range of motion. Neck supple.  Cardiovascular: Normal rate, regular rhythm, normal heart sounds and intact distal pulses.  No murmur heard. Pulmonary/Chest: Effort normal and breath sounds normal. No respiratory distress.  Abdominal: Soft. There is no tenderness.  Musculoskeletal: Normal range of motion. He exhibits no edema.  Neurological: He is alert and oriented to person, place, and time. He has normal strength. He is not disoriented. No cranial nerve deficit or sensory deficit.  Skin: Skin is warm and dry.  Psychiatric: He has a normal mood and affect.  Nursing note and vitals reviewed.    ED Treatments / Results  Labs (all labs ordered are listed, but only abnormal results are displayed) Labs Reviewed  BASIC METABOLIC PANEL - Abnormal; Notable for the following components:      Result Value   Glucose, Bld 100 (*)    Creatinine, Ser  1.50 (*)    GFR calc non Af Amer 50 (*)    GFR calc Af Amer 58 (*)    Anion gap 3 (*)    All other components within normal limits  CBC WITH DIFFERENTIAL/PLATELET - Abnormal; Notable for the following components:   RBC 3.27 (*)    Hemoglobin 10.3 (*)    HCT 30.7 (*)    All other components within normal limits    EKG  EKG Interpretation None       Radiology Ct Head Wo Contrast  Result Date: 12/25/2016 CLINICAL DATA:  61 year old male with history of headache for the past 6 days with some associated photosensitivity and dizziness. EXAM: CT HEAD WITHOUT CONTRAST TECHNIQUE: Contiguous axial images were obtained from the base of the skull through the vertex without intravenous contrast. COMPARISON:  Head CT 02/08/2016. FINDINGS: Brain: No evidence of acute infarction, hemorrhage, hydrocephalus, extra-axial collection or mass lesion/mass effect. Vascular: No hyperdense vessel or unexpected calcification. Skull: Normal. Negative for fracture or focal lesion. Sinuses/Orbits: No acute finding. Other: None. IMPRESSION: 1. No acute intracranial abnormalities. The appearance of the brain is normal. Electronically Signed   By: Vinnie Langton M.D.   On: 12/25/2016 18:44    Procedures Procedures (including critical care time)  Medications Ordered in ED Medications  hydrALAZINE (APRESOLINE) tablet 50 mg (50 mg Oral Given 12/25/16 1721)  HYDROcodone-acetaminophen (NORCO/VICODIN) 5-325 MG per tablet 1 tablet (1 tablet Oral Given 12/25/16 1721)  morphine 4 MG/ML injection 4 mg (4 mg Intramuscular Given 12/25/16 2023)     Initial Impression / Assessment and Plan / ED Course  I have reviewed the triage vital signs and the nursing notes.  Pertinent labs & imaging results that were available during my care of the patient were reviewed by me and considered in my medical decision making (see chart for details).     MSE complete.  Patient presents with headache - onset 6 days prior.  He does  not appear toxic on exam.  He appears comfortable during initial evaluation.  He is hypertensive however he has not taken ENT of his antihypertensive medications today.  Screening CT and blood work does not reveal any acute pathology.  Indications for LP extensively discussed with patient and his family at bedside.  He repeatedly declines LP.  He reports that he feels better after treatment in the ED and desires  discharge home.  He is aware of the need for close follow-up.  He was given strict return precautions - including the presence of fever, increased headache, nausea, vomiting, or other symptoms. He requested Percocet script for home use - I do not feel that such a script is wise and instructed him to FU with his regular physician for additional narcotics.     Final Clinical Impressions(s) / ED Diagnoses   Final diagnoses:  Nonintractable headache, unspecified chronicity pattern, unspecified headache type  Hypertension, unspecified type    ED Discharge Orders    None       Valarie Merino, MD 12/25/16 2216

## 2016-12-25 NOTE — ED Notes (Signed)
Pt provided crackers and sprite.  Pt medicated per MD order

## 2016-12-25 NOTE — ED Triage Notes (Signed)
Per Pt and Family, Pt is coming from home with complaints of headache x 6 days with photosensitivity. Pt denies unilateral weakness. Reports some balance changes and dizziness.

## 2016-12-25 NOTE — ED Notes (Signed)
Pt now in more severe pain than before, pt to be given home HTN meds and reassess

## 2016-12-26 ENCOUNTER — Telehealth: Payer: Self-pay | Admitting: Internal Medicine

## 2016-12-26 NOTE — Telephone Encounter (Signed)
Contacted pt and Ms. Teague stated pt has appointment for this tomorrow.  Katharina Caper, Yee Joss D, [place]

## 2016-12-26 NOTE — Telephone Encounter (Signed)
If patient is continuing to have a headache with elevated blood pressure, he needs to return to ER or have a SDA appointment ASAP.

## 2016-12-26 NOTE — Telephone Encounter (Signed)
Pt called and said he has had a headache for the past 7 days and went to ER last night and they gave him a shot, some pain meds and bp meds and none of those have worked for him. Pt also said when they checked his bp last night it was 210/80. Pt doesn't know what to do next. Please advise.

## 2016-12-27 ENCOUNTER — Ambulatory Visit: Payer: Medicaid Other | Admitting: Internal Medicine

## 2017-01-10 MED FILL — BIKTARVY 50-200-25 MG TABS: 50-200-25 | 30 days supply | Qty: 30 | Fill #5

## 2017-02-10 ENCOUNTER — Emergency Department (HOSPITAL_COMMUNITY): Payer: Medicaid Other

## 2017-02-10 ENCOUNTER — Other Ambulatory Visit: Payer: Self-pay

## 2017-02-10 ENCOUNTER — Encounter (HOSPITAL_COMMUNITY): Payer: Self-pay | Admitting: Emergency Medicine

## 2017-02-10 ENCOUNTER — Emergency Department (HOSPITAL_COMMUNITY)
Admission: EM | Admit: 2017-02-10 | Discharge: 2017-02-11 | Disposition: A | Discharge status: Home / Self Care | Payer: Medicaid Other | Attending: Emergency Medicine | Admitting: Emergency Medicine

## 2017-02-10 DIAGNOSIS — Z7902 Long term (current) use of antithrombotics/antiplatelets: Secondary | ICD-10-CM | POA: Diagnosis not present

## 2017-02-10 DIAGNOSIS — R55 Syncope and collapse: Secondary | ICD-10-CM | POA: Insufficient documentation

## 2017-02-10 DIAGNOSIS — N183 Chronic kidney disease, stage 3 (moderate): Secondary | ICD-10-CM | POA: Insufficient documentation

## 2017-02-10 DIAGNOSIS — Z79899 Other long term (current) drug therapy: Secondary | ICD-10-CM | POA: Diagnosis not present

## 2017-02-10 DIAGNOSIS — F17210 Nicotine dependence, cigarettes, uncomplicated: Secondary | ICD-10-CM | POA: Insufficient documentation

## 2017-02-10 DIAGNOSIS — L309 Dermatitis, unspecified: Secondary | ICD-10-CM | POA: Diagnosis not present

## 2017-02-10 DIAGNOSIS — R51 Headache: Secondary | ICD-10-CM | POA: Diagnosis present

## 2017-02-10 DIAGNOSIS — R519 Headache, unspecified: Secondary | ICD-10-CM

## 2017-02-10 DIAGNOSIS — B20 Human immunodeficiency virus [HIV] disease: Secondary | ICD-10-CM | POA: Diagnosis not present

## 2017-02-10 DIAGNOSIS — Z8679 Personal history of other diseases of the circulatory system: Secondary | ICD-10-CM | POA: Diagnosis not present

## 2017-02-10 DIAGNOSIS — Z7982 Long term (current) use of aspirin: Secondary | ICD-10-CM | POA: Insufficient documentation

## 2017-02-10 DIAGNOSIS — I130 Hypertensive heart and chronic kidney disease with heart failure and stage 1 through stage 4 chronic kidney disease, or unspecified chronic kidney disease: Secondary | ICD-10-CM | POA: Insufficient documentation

## 2017-02-10 DIAGNOSIS — I5032 Chronic diastolic (congestive) heart failure: Secondary | ICD-10-CM | POA: Insufficient documentation

## 2017-02-10 LAB — BASIC METABOLIC PANEL
ANION GAP: 6 (ref 5–15)
BUN: 15 mg/dL (ref 6–20)
CO2: 24 mmol/L (ref 22–32)
Calcium: 8.6 mg/dL — ABNORMAL LOW (ref 8.9–10.3)
Chloride: 107 mmol/L (ref 101–111)
Creatinine, Ser: 1.82 mg/dL — ABNORMAL HIGH (ref 0.61–1.24)
GFR calc Af Amer: 45 mL/min — ABNORMAL LOW (ref >60)
GFR, EST NON AFRICAN AMERICAN: 39 mL/min — AB (ref >60)
Glucose, Bld: 102 mg/dL — ABNORMAL HIGH (ref 65–99)
POTASSIUM: 3.4 mmol/L — AB (ref 3.5–5.1)
SODIUM: 137 mmol/L (ref 135–145)

## 2017-02-10 LAB — CBC
HEMATOCRIT: 27.7 % — AB (ref 39.0–52.0)
HEMOGLOBIN: 9.2 g/dL — AB (ref 13.0–17.0)
MCH: 29.8 pg (ref 26.0–34.0)
MCHC: 33.2 g/dL (ref 30.0–36.0)
MCV: 89.6 fL (ref 78.0–100.0)
Platelets: 289 10*3/uL (ref 150–400)
RBC: 3.09 MIL/uL — AB (ref 4.22–5.81)
RDW: 14.5 % (ref 11.5–15.5)
WBC: 6 10*3/uL (ref 4.0–10.5)

## 2017-02-10 LAB — I-STAT TROPONIN, ED: Troponin i, poc: 0.01 ng/mL (ref 0.00–0.08)

## 2017-02-10 LAB — I-STAT BETA HCG BLOOD, ED (MC, WL, AP ONLY)
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
## 2017-02-10 NOTE — ED Notes (Signed)
Patient transported to CT 

## 2017-02-10 NOTE — ED Notes (Signed)
Per Dr Sabra Heck pt needs to come back. CN Emilie informed

## 2017-02-11 MED ORDER — PREDNISONE 10 MG PO TABS: 20.0000 mg | ORAL_TABLET | Freq: Two times a day (BID) | ORAL | 0 refills | Status: DC

## 2017-02-11 MED ORDER — HYDROXYZINE HCL 25 MG PO TABS: 25.0000 mg | ORAL_TABLET | Freq: Four times a day (QID) | ORAL | 0 refills | Status: DC

## 2017-02-11 NOTE — ED Notes (Signed)
Pt and family understood dc material. NAD noted. Scripts given at dc 

## 2017-02-11 NOTE — Discharge Instructions (Signed)
Prednisone as prescribed.  Hydroxyzine as prescribed as needed for itching.  Continue taking your hydrocodone as needed for pain.  Follow-up with your primary doctor this week.

## 2017-03-01 ENCOUNTER — Other Ambulatory Visit (HOSPITAL_COMMUNITY)
Admission: RE | Admit: 2017-03-01 | Discharge: 2017-03-01 | Disposition: A | Discharge status: Home / Self Care | Payer: Medicaid Other | Source: Ambulatory Visit | Attending: Infectious Disease | Admitting: Infectious Disease

## 2017-03-01 ENCOUNTER — Other Ambulatory Visit: Payer: Medicaid Other

## 2017-03-01 DIAGNOSIS — B20 Human immunodeficiency virus [HIV] disease: Secondary | ICD-10-CM | POA: Diagnosis not present

## 2017-03-02 LAB — CBC WITH DIFFERENTIAL/PLATELET
BASOS PCT: 0.5 %
Basophils Absolute: 32 cells/uL (ref 0–200)
EOS ABS: 83 {cells}/uL (ref 15–500)
Eosinophils Relative: 1.3 %
HEMATOCRIT: 30.3 % — AB (ref 38.5–50.0)
Hemoglobin: 10.3 g/dL — ABNORMAL LOW (ref 13.2–17.1)
LYMPHS ABS: 1261 {cells}/uL (ref 850–3900)
MCH: 31.2 pg (ref 27.0–33.0)
MCHC: 34 g/dL (ref 32.0–36.0)
MCV: 91.8 fL (ref 80.0–100.0)
MPV: 10.1 fL (ref 7.5–12.5)
Monocytes Relative: 4.6 %
Neutro Abs: 4730 cells/uL (ref 1500–7800)
Neutrophils Relative %: 73.9 %
PLATELETS: 302 10*3/uL (ref 140–400)
RBC: 3.3 10*6/uL — AB (ref 4.20–5.80)
RDW: 13.7 % (ref 11.0–15.0)
TOTAL LYMPHOCYTE: 19.7 %
WBC: 6.4 10*3/uL (ref 3.8–10.8)
WBCMIX: 294 {cells}/uL (ref 200–950)

## 2017-03-02 LAB — LIPID PANEL
CHOLESTEROL: 139 mg/dL (ref <200)
HDL: 51 mg/dL (ref >40)
LDL Cholesterol (Calc): 74 mg/dL (calc)
Non-HDL Cholesterol (Calc): 88 mg/dL (calc) (ref <130)
Total CHOL/HDL Ratio: 2.7 (calc) (ref <5.0)
Triglycerides: 63 mg/dL (ref <150)

## 2017-03-02 LAB — COMPLETE METABOLIC PANEL WITH GFR
AG Ratio: 1.3 (calc) (ref 1.0–2.5)
ALBUMIN MSPROF: 3.8 g/dL (ref 3.6–5.1)
ALT: 6 U/L — ABNORMAL LOW (ref 9–46)
AST: 12 U/L (ref 10–35)
Alkaline phosphatase (APISO): 61 U/L (ref 40–115)
BUN / CREAT RATIO: 9 (calc) (ref 6–22)
BUN: 13 mg/dL (ref 7–25)
CALCIUM: 8.8 mg/dL (ref 8.6–10.3)
CO2: 27 mmol/L (ref 20–32)
CREATININE: 1.4 mg/dL — AB (ref 0.70–1.33)
Chloride: 105 mmol/L (ref 98–110)
GFR, EST NON AFRICAN AMERICAN: 55 mL/min/{1.73_m2} — AB (ref >=60)
GFR, Est African American: 63 mL/min/{1.73_m2} (ref >=60)
GLUCOSE: 129 mg/dL — AB (ref 65–99)
Globulin: 2.9 g/dL (calc) (ref 1.9–3.7)
Potassium: 3.6 mmol/L (ref 3.5–5.3)
SODIUM: 137 mmol/L (ref 135–146)
Total Bilirubin: 0.7 mg/dL (ref 0.2–1.2)
Total Protein: 6.7 g/dL (ref 6.1–8.1)

## 2017-03-02 LAB — T-HELPER CELL (CD4) - (RCID CLINIC ONLY)
CD4 T CELL ABS: 770 /uL (ref 400–2700)
CD4 T CELL HELPER: 53 % (ref 33–55)

## 2017-03-02 LAB — URINE CYTOLOGY ANCILLARY ONLY
CHLAMYDIA, DNA PROBE: NEGATIVE
NEISSERIA GONORRHEA: NEGATIVE

## 2017-03-02 LAB — RPR: RPR Ser Ql: NONREACTIVE

## 2017-03-03 LAB — HIV-1 RNA QUANT-NO REFLEX-BLD
HIV 1 RNA QUANT: NOT DETECTED {copies}/mL
HIV-1 RNA QUANT, LOG: NOT DETECTED {Log_copies}/mL

## 2017-03-12 ENCOUNTER — Ambulatory Visit: Payer: Medicaid Other | Admitting: Infectious Disease

## 2017-03-23 MED FILL — BIKTARVY 50-200-25 MG TABS: 50-200-25 | 30 days supply | Qty: 30 | Fill #6

## 2017-04-19 MED FILL — BIKTARVY 50-200-25 MG TABS: 50-200-25 | 30 days supply | Qty: 30 | Fill #7

## 2017-04-27 ENCOUNTER — Other Ambulatory Visit: Payer: Self-pay | Admitting: Pharmacist

## 2017-04-27 DIAGNOSIS — B20 Human immunodeficiency virus [HIV] disease: Secondary | ICD-10-CM

## 2017-05-15 ENCOUNTER — Ambulatory Visit: Payer: Medicaid Other | Admitting: Infectious Diseases

## 2017-06-12 ENCOUNTER — Other Ambulatory Visit: Payer: Self-pay | Admitting: Pharmacist

## 2017-06-12 NOTE — Progress Notes (Signed)
Patient has missed a few appointments with [name].  I see his significant other for PrEP.  Called and rescheduled both appointments for July.

## 2017-08-15 ENCOUNTER — Encounter: Payer: Self-pay | Admitting: Infectious Disease

## 2017-08-15 ENCOUNTER — Encounter: Payer: Self-pay | Admitting: Podiatry

## 2017-08-15 ENCOUNTER — Other Ambulatory Visit (HOSPITAL_COMMUNITY)
Admission: RE | Admit: 2017-08-15 | Discharge: 2017-08-15 | Disposition: A | Discharge status: Home / Self Care | Payer: Medicaid Other | Source: Ambulatory Visit | Attending: Infectious Disease | Admitting: Infectious Disease

## 2017-08-15 ENCOUNTER — Ambulatory Visit (INDEPENDENT_AMBULATORY_CARE_PROVIDER_SITE_OTHER): Payer: Medicaid Other | Admitting: Infectious Disease

## 2017-08-15 ENCOUNTER — Other Ambulatory Visit: Payer: Self-pay | Admitting: Pharmacist

## 2017-08-15 VITALS — BP 203/118 | HR 55 | Temp 98.7°F | Wt 160.0 lb

## 2017-08-15 DIAGNOSIS — B20 Human immunodeficiency virus [HIV] disease: Secondary | ICD-10-CM | POA: Insufficient documentation

## 2017-08-15 DIAGNOSIS — I160 Hypertensive urgency: Secondary | ICD-10-CM | POA: Insufficient documentation

## 2017-08-15 DIAGNOSIS — I25110 Atherosclerotic heart disease of native coronary artery with unstable angina pectoris: Secondary | ICD-10-CM | POA: Diagnosis not present

## 2017-08-15 DIAGNOSIS — N289 Disorder of kidney and ureter, unspecified: Secondary | ICD-10-CM

## 2017-08-15 DIAGNOSIS — Z23 Encounter for immunization: Secondary | ICD-10-CM

## 2017-08-15 DIAGNOSIS — N189 Chronic kidney disease, unspecified: Secondary | ICD-10-CM

## 2017-08-15 DIAGNOSIS — I739 Peripheral vascular disease, unspecified: Secondary | ICD-10-CM

## 2017-08-15 DIAGNOSIS — Z720 Tobacco use: Secondary | ICD-10-CM

## 2017-08-15 DIAGNOSIS — I169 Hypertensive crisis, unspecified: Secondary | ICD-10-CM

## 2017-08-15 DIAGNOSIS — I480 Paroxysmal atrial fibrillation: Secondary | ICD-10-CM

## 2017-08-15 HISTORY — DX: Hypertensive crisis, unspecified: I16.9

## 2017-08-15 MED ORDER — CLONIDINE HCL 0.1 MG PO TABS
0.2000 mg | ORAL_TABLET | Freq: Once | ORAL | Status: AC
  Administered 2017-08-15: 0.2 mg via ORAL

## 2017-08-15 MED ORDER — BICTEGRAVIR-EMTRICITAB-TENOFOV 50-200-25 MG PO TABS: 1.0000 | ORAL_TABLET | Freq: Every day | ORAL | 6 refills | Status: DC

## 2017-08-15 MED ORDER — VARENICLINE TARTRATE 1 MG PO TABS: 1.0000 mg | ORAL_TABLET | Freq: Two times a day (BID) | ORAL | 1 refills | Status: DC

## 2017-08-15 MED ORDER — VARENICLINE TARTRATE 0.5 MG X 11 & 1 MG X 42 PO MISC: ORAL | 0 refills | Status: DC

## 2017-08-15 NOTE — Progress Notes (Unsigned)
Out of refills. Sending to [facility].

## 2017-08-15 NOTE — Progress Notes (Signed)
Medical records requested by [employer] was e-mailed to [email or web address].

## 2017-08-15 NOTE — Addendum Note (Signed)
Addended by: Reggy Eye on: 08/15/2017 05:41 PM   Modules accepted: Orders

## 2017-08-15 NOTE — Progress Notes (Signed)
  Chief complaint: Headache  Subjective:    Patient ID: Kevin [name], male    DOB: 06/08/1955, 61 y.o.   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 Apparently per DIS officers documentation the patient was on interested in engaging in care at that time when they visited him on several occasions. He apparently The diagnosis a secret in the interim has married his wife who came with him today and father child with her.   In any case he tested positive via prompting of the best practice alert for CDC recommendations of patient's 16-65 This performed when he came in for catheterization and workup for his peripheral vascular disease.  I  Had gone over  3 regimens that we could start N/A "test and treat model were we do not have information on his genotype his genetics his hepatitis B coinfection status and he agreed to start BIKTARVY  His genotype came back with wild type virus he did have hepatitis B coinfection bar load was 7444 count was 740. We started him on BIKTARVY and suppressed his virus   Lab Results  Component Value Date   HIV1RNAQUANT <20 NOT DETECTED 03/01/2017     Lab Results  Component Value Date   CD4TABS 770 03/01/2017   CD4TABS 910 08/17/2016   CD4TABS 740 07/03/2016   Today he returns for visit. His BP is 200s over 110s and he had a headache. He states he did not take BP meds today yet.  Not been to see PCP recently.  Past Medical History:  Diagnosis Date  . AIDS (acquired immune deficiency syndrome) (HCC) 08/17/2016  . Chronic diastolic CHF (congestive heart failure), NYHA class 3 (HCC) 01/2016  . Chronic lower back pain   . CKD  (chronic kidney disease), stage III (HCC)   . COPD (chronic obstructive pulmonary disease) (HCC)   . Gout    "forearms, hands, ankles, feet" (06/05/2016)  . Headache    "weekly" (06/05/2016)  . Heart murmur   . Hypertension   . OSA on CPAP   . PAD (peripheral artery disease) (HCC)   . PAF (paroxysmal atrial fibrillation) (HCC) 01/2016    Past Surgical History:  Procedure Laterality Date  . LEFT HEART CATH AND CORONARY ANGIOGRAPHY N/A 10/23/2016   Procedure: LEFT HEART CATH AND CORONARY ANGIOGRAPHY;  Surgeon: Harding, David W, MD;  Location: [facility];  Service: Cardiovascular;  Laterality: N/A;  . LOWER EXTREMITY ANGIOGRAPHY N/A 07/17/2016   Procedure: Lower Extremity Angiography;  Surgeon: Berry, Jonathan J, MD;  Location: [facility];  Service: Cardiovascular;  Laterality: N/A;  . LOWER EXTREMITY INTERVENTION N/A 06/05/2016   Procedure: Lower Extremity Intervention;  Surgeon: Jonathan J Berry, MD;  Location: [facility];  Service: Cardiovascular;  Laterality: N/A;  . PERIPHERAL VASCULAR ATHERECTOMY Right 07/17/2016   Procedure: Peripheral Vascular Atherectomy;  Surgeon: Berry, Jonathan J, MD;  Location: [facility];  Service: Cardiovascular;  Laterality: Right;  SFA  . PERIPHERAL VASCULAR INTERVENTION  06/05/2016   Procedure: Peripheral Vascular Intervention;  Surgeon: Jonathan J Berry, MD;  Location: [facility];  Service: Cardiovascular;;  left SFA    Family History  Problem Relation Age of Onset  .   High blood pressure Mother   . Lupus Mother       Social History   Socioeconomic History  . Marital status: Divorced    Spouse name: Not on file  . Number of children: Not on file  . Years of education: Not on file  . Highest education level: Not on file  Occupational History  . Not on file  Social Needs  . Financial resource strain: Not on file  . Food insecurity:    Worry: Not on file    Inability: Not on file  . Transportation needs:     Medical: Not on file    Non-medical: Not on file  Tobacco Use  . Smoking status: Current Every Day Smoker    Packs/day: 0.25    Years: 42.00    Pack years: 10.50    Types: Cigarettes  . Smokeless tobacco: Never Used  Substance and Sexual Activity  . Alcohol use: Yes    Alcohol/week: 1.2 oz    Types: 2 Cans of beer per week  . Drug use: No  . Sexual activity: Not Currently  Lifestyle  . Physical activity:    Days per week: Not on file    Minutes per session: Not on file  . Stress: Not on file  Relationships  . Social connections:    Talks on phone: Not on file    Gets together: Not on file    Attends religious service: Not on file    Active member of club or organization: Not on file    Attends meetings of clubs or organizations: Not on file    Relationship status: Not on file  Other Topics Concern  . Not on file  Social History Narrative  . Not on file    No Known Allergies   Current Outpatient Medications:  .  aspirin 81 MG EC tablet, Take 1 tablet (81 mg total) by mouth daily., Disp: 30 tablet, Rfl: 12 .  bictegravir-emtricitabine-tenofovir AF (BIKTARVY) 50-200-25 MG TABS tablet, Take 1 tablet by mouth daily., Disp: 30 tablet, Rfl: 11 .  CARTIA XT 180 MG 24 hr capsule, TAKE ONE CAPSULE BY MOUTH DAILY, Disp: 30 capsule, Rfl: 11 .  carvedilol (COREG) 12.5 MG tablet, Take 1 tablet (12.5 mg total) by mouth 2 (two) times daily with a meal. (Patient taking differently: Take 12.5 mg by mouth 2 (two) times daily. ), Disp: 60 tablet, Rfl: 3 .  clopidogrel (PLAVIX) 75 MG tablet, Take 1 tablet (75 mg total) by mouth daily with breakfast. (Patient taking differently: Take 75 mg by mouth at bedtime. ), Disp: 90 tablet, Rfl: 3 .  DULERA 100-5 MCG/ACT AERO, INHALE 2 PUFFS INTO THE LUNGS TWICE DAILY, Disp: 13 g, Rfl: 0 .  furosemide (LASIX) 40 MG tablet, TAKE 1 TABLET BY MOUTH DAILY (Patient taking differently: TAKE 1 TABLET BY MOUTH DAILY AT BEDTIME.), Disp: 30 tablet, Rfl: 11 .   hydrALAZINE (APRESOLINE) 50 MG tablet, TAKE 1 tablet (50 MG) EVERY 8 HOURS (Patient taking differently: Take 50 mg 3 (three) times daily by mouth. TAKE 1 tablet (50 MG) EVERY 8 HOURS), Disp: 135 tablet, Rfl: 5 .  HYDROcodone-acetaminophen (NORCO) 10-325 MG tablet, Take 1 tablet by mouth every 8 (eight) hours as needed. (Patient taking differently: Take 1 tablet every 8 (eight) hours as needed by mouth (for pain.). ), Disp: 20 tablet, Rfl: 0 .  hydrOXYzine (ATARAX/VISTARIL) 25 MG tablet, Take 1 tablet (25 mg total) by mouth every 6 (six) hours., Disp: 12 tablet, Rfl:  0 .  isosorbide mononitrate (IMDUR) 30 MG 24 hr tablet, TAKE 1 TABLET BY MOUTH DAILY, Disp: 30 tablet, Rfl: 11 .  LYRICA 150 MG capsule, Take 150 mg by mouth at bedtime. , Disp: , Rfl: 3 .  potassium chloride (K-DUR) 10 MEQ tablet, Take 2 tablets (20 mEq total) by mouth daily. (Patient taking differently: Take 20 mEq by mouth at bedtime. ), Disp: 180 tablet, Rfl: 3 .  predniSONE (DELTASONE) 10 MG tablet, Take 2 tablets (20 mg total) by mouth 2 (two) times daily with a meal., Disp: 12 tablet, Rfl: 0 .  PROAIR HFA 108 (90 Base) MCG/ACT inhaler, INHALE 2 PUFFS INTO THE LUNGS EVERY 6 HOURS AS NEEDED FOR WHEEZING OR SHORTNESS OF BREATH, Disp: 8.5 g, Rfl: 0 .  SPIRIVA HANDIHALER 18 MCG inhalation capsule, INHALE CONTENTS OF 1 CAPSULE ONCE DAILY USING HANDIHALER, Disp: 30 capsule, Rfl: 5 .  traZODone (DESYREL) 100 MG tablet, Take 100 mg at bedtime as needed by mouth for sleep. , Disp: , Rfl:     Review of Systems  Constitutional: Negative for chills and fever.  Respiratory: Negative for cough, shortness of breath and wheezing.   Cardiovascular: Negative for chest pain, palpitations and leg swelling.  Gastrointestinal: Negative for abdominal pain.  Genitourinary: Negative for dysuria, flank pain and hematuria.  Musculoskeletal: Negative for back pain and myalgias.  Skin: Negative for rash.  Neurological: Positive for headaches. Negative for  dizziness.  Hematological: Does not bruise/bleed easily.  Psychiatric/Behavioral: Negative for agitation, behavioral problems, confusion, decreased concentration, dysphoric mood, hallucinations and suicidal ideas. The patient is not nervous/anxious and is not hyperactive.        Objective:   Physical Exam  Constitutional: He is oriented to person, place, and time. No distress.  HENT:  Head: Normocephalic and atraumatic.  Mouth/Throat: No oropharyngeal exudate.  Eyes: Conjunctivae and EOM are normal. No scleral icterus.  Neck: Normal range of motion. Neck supple.  Cardiovascular: Normal rate, regular rhythm and intact distal pulses. Exam reveals no gallop and no friction rub.  No murmur heard. Pulmonary/Chest: Effort normal and breath sounds normal. No respiratory distress. He has no wheezes.  Abdominal: Soft. He exhibits no distension.  Musculoskeletal: He exhibits no edema or tenderness.  Neurological: He is alert and oriented to person, place, and time. He exhibits normal muscle tone. Coordination normal.  Skin: Skin is warm and dry. No rash noted. He is not diaphoretic. No erythema. No pallor.  Psychiatric: His speech is normal and behavior is normal. Judgment and thought content normal. His affect is not blunt.          Assessment & Plan:   HIV disease: continue BIKTARVY and will check labstoday, vaccinate for pneumonia, Hep B today  PAD being followed closely by [facility]  CHF followed by Cardiology, HF and by primary   Smoking: encouraged cessation. rx for chantix sent  HTNSIVE urgenncy: giving clonidine here and will recheck bp. He needs to be seen by PCP  I spent greater than 25 minutes with the patient including greater than 50% of time in face to face counsel of the patient ret the risks of uncontrolled HTN including renal faiilure, CVA further CV events, risks of smoking and counselling re cessation and in coordination of his care.

## 2017-08-16 LAB — URINE CYTOLOGY ANCILLARY ONLY
CHLAMYDIA, DNA PROBE: NEGATIVE
NEISSERIA GONORRHEA: NEGATIVE

## 2017-08-17 LAB — COMPLETE METABOLIC PANEL WITH GFR
AG RATIO: 1.2 (calc) (ref 1.0–2.5)
ALBUMIN MSPROF: 3.6 g/dL (ref 3.6–5.1)
ALT: 11 U/L (ref 9–46)
AST: 18 U/L (ref 10–35)
Alkaline phosphatase (APISO): 64 U/L (ref 40–115)
BILIRUBIN TOTAL: 0.6 mg/dL (ref 0.2–1.2)
BUN / CREAT RATIO: 8 (calc) (ref 6–22)
BUN: 12 mg/dL (ref 7–25)
CHLORIDE: 110 mmol/L (ref 98–110)
CO2: 23 mmol/L (ref 20–32)
Calcium: 8.3 mg/dL — ABNORMAL LOW (ref 8.6–10.3)
Creat: 1.45 mg/dL — ABNORMAL HIGH (ref 0.70–1.33)
GFR, Est African American: 61 mL/min/{1.73_m2} (ref >=60)
GFR, Est Non African American: 52 mL/min/{1.73_m2} — ABNORMAL LOW (ref >=60)
GLOBULIN: 3 g/dL (ref 1.9–3.7)
Glucose, Bld: 81 mg/dL (ref 65–99)
POTASSIUM: 3.7 mmol/L (ref 3.5–5.3)
SODIUM: 140 mmol/L (ref 135–146)
Total Protein: 6.6 g/dL (ref 6.1–8.1)

## 2017-08-17 LAB — CBC WITH DIFFERENTIAL/PLATELET
Basophils Absolute: 31 cells/uL (ref 0–200)
Basophils Relative: 0.6 %
EOS PCT: 3.4 %
Eosinophils Absolute: 177 cells/uL (ref 15–500)
HCT: 31.9 % — ABNORMAL LOW (ref 38.5–50.0)
Hemoglobin: 10.7 g/dL — ABNORMAL LOW (ref 13.2–17.1)
Lymphs Abs: 1612 cells/uL (ref 850–3900)
MCH: 31.8 pg (ref 27.0–33.0)
MCHC: 33.5 g/dL (ref 32.0–36.0)
MCV: 94.9 fL (ref 80.0–100.0)
MONOS PCT: 8.2 %
MPV: 10 fL (ref 7.5–12.5)
NEUTROS PCT: 56.8 %
Neutro Abs: 2954 cells/uL (ref 1500–7800)
PLATELETS: 279 10*3/uL (ref 140–400)
RBC: 3.36 10*6/uL — AB (ref 4.20–5.80)
RDW: 13.4 % (ref 11.0–15.0)
TOTAL LYMPHOCYTE: 31 %
WBC: 5.2 10*3/uL (ref 3.8–10.8)
WBCMIX: 426 {cells}/uL (ref 200–950)

## 2017-08-17 LAB — LIPID PANEL
CHOLESTEROL: 122 mg/dL (ref <200)
HDL: 51 mg/dL (ref >40)
LDL Cholesterol (Calc): 58 mg/dL (calc)
Non-HDL Cholesterol (Calc): 71 mg/dL (calc) (ref <130)
TRIGLYCERIDES: 57 mg/dL (ref <150)
Total CHOL/HDL Ratio: 2.4 (calc) (ref <5.0)

## 2017-08-17 LAB — RPR: RPR Ser Ql: NONREACTIVE

## 2017-08-17 LAB — T-HELPER CELL (CD4) - (RCID CLINIC ONLY)
CD4 % Helper T Cell: 54 % (ref 33–55)
CD4 T Cell Abs: 920 /uL (ref 400–2700)

## 2017-08-20 LAB — HIV-1 RNA QUANT-NO REFLEX-BLD
HIV 1 RNA QUANT: NOT DETECTED {copies}/mL
HIV-1 RNA Quant, Log: <1.3 Log copies/mL

## 2017-08-20 MED FILL — BIKTARVY 50-200-25 MG TABS: 50-200-25 | 30 days supply | Qty: 30 | Fill #0

## 2017-08-31 ENCOUNTER — Telehealth: Payer: Self-pay | Admitting: *Deleted

## 2017-08-31 NOTE — Telephone Encounter (Signed)
LM with Ms.Teague to have pt contact our office to make an appointment to meet new PCP and to have paperwork complete.  If he calls back please help him get this scheduled. Katharina Caper, April D, [place]

## 2017-08-31 NOTE — Telephone Encounter (Signed)
-----  Message from Sherene Sires, DO sent at 08/29/2017  7:56 AM EDT ----- Regarding: schedule appt please Please call patient to schedule appt, I have a large packet requested for a disability claim and I've never met this patient.  -Dr. Criss Rosales

## 2017-09-07 NOTE — Telephone Encounter (Signed)
LVM to call office back to inform him of below and assist in getting an appointment. Kevin [name], Kevin [name] D, [place]

## 2017-09-12 NOTE — Telephone Encounter (Signed)
Pt has an appt with Dr. Criss Rosales on 8/6. Ottis Stain, CMA

## 2017-09-18 ENCOUNTER — Encounter: Payer: Self-pay | Admitting: Family Medicine

## 2017-09-18 ENCOUNTER — Ambulatory Visit (INDEPENDENT_AMBULATORY_CARE_PROVIDER_SITE_OTHER): Payer: Medicaid Other | Admitting: Family Medicine

## 2017-09-18 ENCOUNTER — Other Ambulatory Visit: Payer: Self-pay

## 2017-09-18 VITALS — BP 164/96 | HR 65 | Temp 98.4°F | Ht 72.0 in | Wt 161.0 lb

## 2017-09-18 DIAGNOSIS — Z720 Tobacco use: Secondary | ICD-10-CM

## 2017-09-18 DIAGNOSIS — I10 Essential (primary) hypertension: Secondary | ICD-10-CM | POA: Diagnosis not present

## 2017-09-18 DIAGNOSIS — I739 Peripheral vascular disease, unspecified: Secondary | ICD-10-CM | POA: Diagnosis not present

## 2017-09-18 DIAGNOSIS — I5033 Acute on chronic diastolic (congestive) heart failure: Secondary | ICD-10-CM

## 2017-09-18 MED ORDER — ASPIRIN 81 MG PO TBEC: 81.0000 mg | DELAYED_RELEASE_TABLET | Freq: Every day | ORAL | 12 refills | Status: DC

## 2017-09-18 MED ORDER — CARVEDILOL 12.5 MG PO TABS: 12.5000 mg | ORAL_TABLET | Freq: Two times a day (BID) | ORAL | 3 refills | Status: DC

## 2017-09-18 MED ORDER — CLOPIDOGREL BISULFATE 75 MG PO TABS: 75.0000 mg | ORAL_TABLET | Freq: Every day | ORAL | 3 refills | Status: DC

## 2017-09-18 MED ORDER — POTASSIUM CHLORIDE ER 10 MEQ PO TBCR: 20.0000 meq | EXTENDED_RELEASE_TABLET | Freq: Every day | ORAL | 3 refills | Status: DC

## 2017-09-18 MED ORDER — FUROSEMIDE 40 MG PO TABS: 40.0000 mg | ORAL_TABLET | Freq: Every day | ORAL | 11 refills | Status: DC

## 2017-09-18 MED ORDER — VARENICLINE TARTRATE 0.5 MG X 11 & 1 MG X 42 PO MISC: ORAL | 0 refills | Status: DC

## 2017-09-18 NOTE — Patient Instructions (Signed)
It was a pleasure to see you today! Thank you for choosing [facility] for your primary care. Kevin [name] was seen for medication refill and paperwork. Come back to the clinic in 1 month to check in on your progress after restarting some meds, and go to the emergency room if you have any life threatening symptoms.  Today we talked about restarting you meds and you agreed to start some of them.  They are ordered and I'd like to see oyu in a month to discuss the results.  I also put in a referral to podiatry because you are not happy with your current one.    Please bring all your medications to every doctors visit   Sign up for My Chart to have easy access to your labs results, and communication with your Primary care physician.     Please check-out at the front desk before leaving the clinic.     Best,  Dr. Sherene Sires FAMILY MEDICINE RESIDENT - PGY2 09/18/2017 10:58 AM

## 2017-09-19 NOTE — Assessment & Plan Note (Signed)
Patient stopped taking his meds ~ 93months ago, after discussion of stroke risk was willing to restart aspirin/plavix.  Will reorder

## 2017-09-19 NOTE — Assessment & Plan Note (Signed)
Patient still smoking ~1pack/day but is willing to try and stop smoking.  Was rx'd varencycline in the past and never filled it.  Will reorder

## 2017-09-19 NOTE — Progress Notes (Signed)
    Subjective:  Kevin [name] is a 62 y.o. male who presents to the [facility] today with a chief complaint of podiatry referal and medication reconciliation.   HPI: Patient has been not taking most of his meds for ~12months because "they don't make me feel good".  Mainly he takes his HIV and pain meds.  He wants a podiatry referal because he says "all my guy now does is cut my toenails, I can do that" and he has a complaint about knots under his foot that he feels aren't being treated.  He says sbp~200 doesn't hurt him and he doesn't know why people keep bothering him about taking meds that don't make him feel good.  He says he doesn't have any energy if his BP is below 150 and he doesn't want people trying to make him feel tired.  He describes no other pain/complaints  Objective:  Physical Exam: BP (!) 164/96   Pulse 65   Temp 98.4 F (36.9 C) (Oral)   Ht 6' (1.829 m)   Wt 161 lb (73 kg)   SpO2 98%   BMI 21.84 kg/m   BP charted at sbp164 but initial on walking in was >190 Gen: NAD, resting comfortably CV: RRR with 2/6 murmur appreciated Pulm: NWOB, minor diffuse course lung sounds GI: Normal bowel sounds present. Soft, Nontender, Nondistended. MSK: no edema, cyanosis, or clubbing noted Skin: warm, dry.  Both feet have what appears to be 1 large plantars wart vs bunyon Neuro: grossly normal, moves all extremities Psych: Normal affect and thought content  No results found for this or any previous visit (from the past 72 hour(s)).   Assessment/Plan:  Acute on chronic diastolic CHF (congestive heart failure), NYHA class 3 ([place]) Patient had stopped taking meds for ~43months, he has agreed to restart some of them.  We are reordering coreg and furosemide with potassium supplementation  Peripheral arterial disease ([place]) Patient stopped taking his meds ~ 46months ago, after discussion of stroke risk was willing to restart aspirin/plavix.  Will reorder  Tobacco abuse Patient still  smoking ~1pack/day but is willing to try and stop smoking.  Was rx'd varencycline in the past and never filled it.  Will reorder   Sherene Sires, [place] - PGY2 09/19/2017 9:45 AM

## 2017-09-19 NOTE — Assessment & Plan Note (Signed)
Patient had stopped taking meds for ~58months, he has agreed to restart some of them.  We are reordering coreg and furosemide with potassium supplementation

## 2017-09-24 ENCOUNTER — Telehealth: Payer: Self-pay

## 2017-09-24 NOTE — Telephone Encounter (Signed)
papter work for [employer] has been received. Per Dr. Criss Rosales, we do not fill these forms out. The pt can go to [facility] office or we can put in a referral to [place] and they will fill them out. I have LVM for pt to call the office. If Pt calls, please give him this info and ask if he would like to pick up the paperwork. Ottis Stain, CMA

## 2017-10-22 ENCOUNTER — Ambulatory Visit: Payer: Medicaid Other | Admitting: Family Medicine

## 2017-11-05 ENCOUNTER — Telehealth: Payer: Self-pay | Admitting: Pharmacist

## 2017-11-05 NOTE — Telephone Encounter (Signed)
[facility] is unable to get in touch with patient for the last 2 months to refill his Biktarvy.  I tried both numbers as well and no one answers.

## 2017-12-17 ENCOUNTER — Ambulatory Visit: Payer: Medicaid Other | Admitting: Infectious Disease

## 2018-01-15 ENCOUNTER — Telehealth: Payer: Self-pay | Admitting: Family Medicine

## 2018-01-15 NOTE — Telephone Encounter (Signed)
Attempted to contact pt about their overdue colonoscopy and their available options. Unable to reach and was sent to voicemail. -[place]

## 2018-01-28 ENCOUNTER — Ambulatory Visit: Payer: Medicaid Other | Admitting: Pharmacist

## 2018-03-29 ENCOUNTER — Telehealth: Payer: Self-pay | Admitting: Pharmacist

## 2018-03-29 NOTE — Telephone Encounter (Signed)
Called patient regarding COPD medications and therapy optimization, first to schedule PFTs. Called to schedule appointment for PFTs. Left HIPAA compliant VM.

## 2018-07-09 ENCOUNTER — Other Ambulatory Visit: Payer: Self-pay

## 2018-07-09 ENCOUNTER — Ambulatory Visit (HOSPITAL_COMMUNITY)
Admission: EM | Admit: 2018-07-09 | Discharge: 2018-07-09 | Disposition: A | Discharge status: Home / Self Care | Payer: Medicaid Other | Attending: Family Medicine | Admitting: Family Medicine

## 2018-07-09 ENCOUNTER — Ambulatory Visit (INDEPENDENT_AMBULATORY_CARE_PROVIDER_SITE_OTHER): Payer: Medicaid Other

## 2018-07-09 ENCOUNTER — Encounter (HOSPITAL_COMMUNITY): Payer: Self-pay | Admitting: Emergency Medicine

## 2018-07-09 DIAGNOSIS — J441 Chronic obstructive pulmonary disease with (acute) exacerbation: Secondary | ICD-10-CM | POA: Diagnosis not present

## 2018-07-09 DIAGNOSIS — R0602 Shortness of breath: Secondary | ICD-10-CM

## 2018-07-09 MED ORDER — ALBUTEROL SULFATE HFA 108 (90 BASE) MCG/ACT IN AERS
INHALATION_SPRAY | RESPIRATORY_TRACT | Status: AC
  Filled 2018-07-09: qty 6.7

## 2018-07-09 MED ORDER — ALBUTEROL SULFATE HFA 108 (90 BASE) MCG/ACT IN AERS
2.0000 | INHALATION_SPRAY | Freq: Once | RESPIRATORY_TRACT | Status: AC
  Administered 2018-07-09: 11:00:00 2 via RESPIRATORY_TRACT

## 2018-07-09 MED ORDER — PREDNISONE 20 MG PO TABS: 20.0000 mg | ORAL_TABLET | Freq: Two times a day (BID) | ORAL | 0 refills | Status: DC

## 2018-07-09 MED ORDER — ALBUTEROL SULFATE (2.5 MG/3ML) 0.083% IN NEBU: 2.5000 mg | INHALATION_SOLUTION | Freq: Four times a day (QID) | RESPIRATORY_TRACT | 12 refills | Status: DC | PRN

## 2018-07-09 NOTE — ED Triage Notes (Addendum)
Sob for 4 days.  Patient reports breathing is worse at night.  Overall weakness Patient has a cough Denies fever Scratchy throat last night.    No history of this.  Used proair inhaler.  Patient reports pain in chest is with his breathing, breathing in is painful  "cant do anything that I was doing last week"

## 2018-07-09 NOTE — ED Provider Notes (Signed)
[place]    CSN: [id number] Arrival date & time: 07/09/18  1751     History   Chief Complaint Chief Complaint  Patient presents with  . Shortness of Breath    HPI EDIN KON is a 63 y.o. male.   HPI  Patient with known noncompliance with medical treatment of his hypertension.  Not taking his medication.  Blood pressure is elevated. He has known COPD but is not using his maintenance inhaler.  He had an old albuterol inhaler that he has been using the last couple of days. He is here with a chief complaint of shortness of breath.  Dyspnea with exertion.  Trouble sleeping at night.  Feeling like he cannot breathe which is causing him to panic. No coughing or chest congestion.  No sputum production.  No chest pain.  No body aches.  No fever or chills.  No change in ability to smell or taste.  No nausea vomiting diarrhea.  No known exposure to infection or COVID-19.  He works on a food truck.  Does not always wear a mask. No chest pain or pressure.  No exertional chest pain.  He had a negative cardiac cath in 2018.  No pedal edema or fluid overload Past Medical History:  Diagnosis Date  . AIDS (acquired immune deficiency syndrome) ([place]) 08/17/2016  . Chronic diastolic CHF (congestive heart failure), NYHA class 3 ([place]) 01/2016  . Chronic lower back pain   . CKD (chronic kidney disease), stage III ([place])   . COPD (chronic obstructive pulmonary disease) ([place])   . Gout    "forearms, hands, ankles, feet" (06/05/2016)  . Headache    "weekly" (06/05/2016)  . Heart murmur   . Hypertension   . Hypertensive crisis 08/15/2017  . OSA on CPAP   . PAD (peripheral artery disease) ([place])   . PAF (paroxysmal atrial fibrillation) ([place]) 01/2016    Patient Active Problem List   Diagnosis Date Noted  . Hypertensive crisis 08/15/2017  . Coronary artery disease involving native coronary artery of native heart with unstable angina pectoris ([place]) 10/19/2016  . Easy bruising 08/11/2016  .  Claudication in peripheral vascular disease ([place]) 06/05/2016  . Stage 3 chronic kidney disease ([place])   . Peripheral arterial disease ([place]) 05/09/2016  . Acute on chronic diastolic CHF (congestive heart failure), NYHA class 3 ([place]) 03/24/2016  . OSA (obstructive sleep apnea) 03/24/2016  . Uncontrolled hypertension 02/18/2016  . 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 Shortness of breath 02/07/2016  . PAF (paroxysmal atrial fibrillation) ([place]) 02/07/2016  . Tobacco abuse 02/07/2016  . Acute on chronic renal insufficiency 02/07/2016  . COPD (chronic obstructive pulmonary disease) ([place]) 02/07/2016  . Hypertensive emergency 02/07/2016  . HIV disease ([place]) 08/27/2004    Past Surgical History:  Procedure Laterality Date  . LEFT HEART CATH AND CORONARY ANGIOGRAPHY N/A 10/23/2016   Procedure: LEFT HEART CATH AND CORONARY ANGIOGRAPHY;  Surgeon: Leonie Man, MD;  Location: [facility];  Service: Cardiovascular;  Laterality: N/A;  . LOWER EXTREMITY ANGIOGRAPHY N/A 07/17/2016   Procedure: Lower Extremity Angiography;  Surgeon: Lorretta Harp, MD;  Location: [facility];  Service: Cardiovascular;  Laterality: N/A;  . LOWER EXTREMITY INTERVENTION N/A 06/05/2016   Procedure: Lower Extremity Intervention;  Surgeon: Lorretta Harp, MD;  Location: [facility];  Service: Cardiovascular;  Laterality: N/A;  . PERIPHERAL VASCULAR ATHERECTOMY Right 07/17/2016   Procedure: Peripheral Vascular Atherectomy;  Surgeon: Lorretta Harp, MD;  Location:  [facility];  Service: Cardiovascular;  Laterality: Right;  SFA  . PERIPHERAL VASCULAR INTERVENTION  06/05/2016   Procedure: Peripheral Vascular Intervention;  Surgeon: Lorretta Harp, MD;  Location: [facility];  Service: Cardiovascular;;  left SFA       Home Medications    Prior to Admission medications   Medication Sig Start Date End Date Taking? Authorizing Provider  bictegravir-emtricitabine-tenofovir AF  (BIKTARVY) 50-200-25 MG TABS tablet Take 1 tablet by mouth daily. 08/15/17  Yes Kuppelweiser, [name] L, RPH-CPP  albuterol (PROVENTIL) (2.5 MG/3ML) 0.083% nebulizer solution Take 3 mLs (2.5 mg total) by nebulization every 6 (six) hours as needed for wheezing or shortness of breath. 07/09/18   Raylene Everts, MD  potassium chloride (K-DUR) 10 MEQ tablet Take 2 tablets (20 mEq total) by mouth daily. 09/18/17   Sherene Sires, DO  predniSONE (DELTASONE) 20 MG tablet Take 1 tablet (20 mg total) by mouth 2 (two) times daily with a meal. 07/09/18   Raylene Everts, MD    Family History Family History  Problem Relation Age of Onset  . High blood pressure Mother   . Lupus Mother     Social History Social History   Tobacco Use  . Smoking status: Current Every Day Smoker    Packs/day: 0.25    Years: 42.00    Pack years: 10.50    Types: Cigarettes  . Smokeless tobacco: Never Used  Substance Use Topics  . Alcohol use: Yes    Alcohol/week: 2.0 standard drinks    Types: 2 Cans of beer per week  . Drug use: No     Allergies   Patient has no known allergies.   Review of Systems Review of Systems  Constitutional: Negative for chills, fatigue and fever.  HENT: Negative for ear pain and sore throat.   Eyes: Negative for pain and visual disturbance.  Respiratory: Positive for chest tightness, shortness of breath and wheezing. Negative for cough.   Cardiovascular: Negative for chest pain and palpitations.  Gastrointestinal: Negative for abdominal pain, nausea and vomiting.  Genitourinary: Negative for dysuria and hematuria.  Musculoskeletal: Negative for arthralgias and back pain.  Skin: Negative for color change and rash.  Neurological: Negative for seizures and syncope.  All other systems reviewed and are negative.    Physical Exam Triage Vital Signs ED Triage Vitals  Enc Vitals Group     BP 07/09/18 1012 (!) 215/110     Pulse Rate 07/09/18 1012 (!) 57     Resp 07/09/18 1012 (!)  22     Temp 07/09/18 1012 98.2 F (36.8 C)     Temp Source 07/09/18 1012 Oral     SpO2 07/09/18 1012 100 %     Weight --      Height --      Head Circumference --      Peak Flow --      Pain Score 07/09/18 1006 6     Pain Loc --      Pain Edu? --      Excl. in [place]? --    No data found.  Updated Vital Signs BP (!) 178/102 (BP Location: Left Arm) Comment: repositioned  Pulse 60   Temp 98.2 F (36.8 C) (Oral)   Resp (!) 22   SpO2 100%       Physical Exam Constitutional:      General: He is not in acute distress.    Appearance: He is well-developed.  HENT:  Head: Normocephalic and atraumatic.  Eyes:     Conjunctiva/sclera: Conjunctivae normal.     Pupils: Pupils are equal, round, and reactive to light.  Neck:     Musculoskeletal: Normal range of motion.  Cardiovascular:     Rate and Rhythm: Normal rate and regular rhythm.     Heart sounds: Normal heart sounds.  Pulmonary:     Effort: Pulmonary effort is normal. Tachypnea present.     Breath sounds: Decreased breath sounds and wheezing present.  Chest:     Chest wall: No mass, deformity or tenderness.  Abdominal:     General: There is no distension.     Palpations: Abdomen is soft.  Musculoskeletal: Normal range of motion.  Skin:    General: Skin is warm and dry.  Neurological:     General: No focal deficit present.     Mental Status: He is alert.  Psychiatric:        Mood and Affect: Mood normal.      UC Treatments / Results  Labs (all labs ordered are listed, but only abnormal results are displayed) Labs Reviewed - No data to display  EKG None  Radiology Dg Chest 2 View  Result Date: 07/09/2018 CLINICAL DATA:  Shortness of breath EXAM: CHEST - 2 VIEW COMPARISON:  02/10/2017 FINDINGS: The heart size and mediastinal contours are within normal limits. Both lungs are clear. The visualized skeletal structures are unremarkable. IMPRESSION: No acute abnormality of the lungs.  No focal airspace opacity.  Electronically Signed   By: Eddie Candle M.D.   On: 07/09/2018 10:54    Procedures Procedures (including critical care time)  Medications Ordered in UC Medications  albuterol (VENTOLIN HFA) 108 (90 Base) MCG/ACT inhaler 2 puff (2 puffs Inhalation Given 07/09/18 1050)    Initial Impression / Assessment and Plan / UC Course  I have reviewed the triage vital signs and the nursing notes.  Pertinent labs & imaging results that were available during my care of the patient were reviewed by me and considered in my medical decision making (see chart for details).    I discussed with the patient that I believe he has an exacerbation of his chronic bronchitis and COPD.  He has uncontrolled hypertension.  He was offered COVID-19 testing if he has concerns.  He declines.  Final Clinical Impressions(s) / UC Diagnoses   Final diagnoses:  COPD exacerbation ([place])     Discharge Instructions     Drink plenty of fluids Take prednisone 2 times a day Use the nebulizer for breathing treatments every 4-6 hours.  This will help with your breathing better than the inhaler.  Use before bedtime to help you sleep.  Call your primary care doctor for medicine refills.  Blood pressure is very high.  I am worried that you are not taking your blood pressure medicines, or the medicine for your circulation.  This places you at increased risk for heart attack, stroke, and medical complications.  Please make an appointment to see your primary care doctor    ED Prescriptions    Medication Sig Dispense Auth. Provider   albuterol (PROVENTIL) (2.5 MG/3ML) 0.083% nebulizer solution Take 3 mLs (2.5 mg total) by nebulization every 6 (six) hours as needed for wheezing or shortness of breath. 75 mL Raylene Everts, MD   predniSONE (DELTASONE) 20 MG tablet Take 1 tablet (20 mg total) by mouth 2 (two) times daily with a meal. 10 tablet Raylene Everts, MD     Controlled  Substance Prescriptions [place] Controlled Substance  Registry consulted? no   Raylene Everts, MD 07/09/18 [id number]

## 2018-07-09 NOTE — Discharge Instructions (Addendum)
Drink plenty of fluids Take prednisone 2 times a day Use the nebulizer for breathing treatments every 4-6 hours.  This will help with your breathing better than the inhaler.  Use before bedtime to help you sleep.  Call your primary care doctor for medicine refills.  Blood pressure is very high.  I am worried that you are not taking your blood pressure medicines, or the medicine for your circulation.  This places you at increased risk for heart attack, stroke, and medical complications.  Please make an appointment to see your primary care doctor

## 2018-07-09 NOTE — ED Notes (Signed)
Pt given home nebulizer

## 2018-07-10 ENCOUNTER — Ambulatory Visit (INDEPENDENT_AMBULATORY_CARE_PROVIDER_SITE_OTHER): Payer: Medicaid Other | Admitting: Family Medicine

## 2018-07-10 ENCOUNTER — Other Ambulatory Visit: Payer: Self-pay

## 2018-07-10 VITALS — BP 140/100 | HR 65

## 2018-07-10 DIAGNOSIS — L84 Corns and callosities: Secondary | ICD-10-CM

## 2018-07-10 DIAGNOSIS — I10 Essential (primary) hypertension: Secondary | ICD-10-CM

## 2018-07-10 DIAGNOSIS — J441 Chronic obstructive pulmonary disease with (acute) exacerbation: Secondary | ICD-10-CM

## 2018-07-10 DIAGNOSIS — I739 Peripheral vascular disease, unspecified: Secondary | ICD-10-CM | POA: Diagnosis not present

## 2018-07-10 MED ORDER — ALBUTEROL SULFATE HFA 108 (90 BASE) MCG/ACT IN AERS: 2.0000 | INHALATION_SPRAY | RESPIRATORY_TRACT | 0 refills | Status: DC | PRN

## 2018-07-10 MED ORDER — MOMETASONE FURO-FORMOTEROL FUM 100-5 MCG/ACT IN AERO: 2.0000 | INHALATION_SPRAY | Freq: Two times a day (BID) | RESPIRATORY_TRACT | 2 refills | Status: DC

## 2018-07-10 NOTE — Assessment & Plan Note (Signed)
Multiple calluses of both feet but no signs of necrotic tissue, no uclers.On his feet most of the day and walking due to demands of his job. Has history of PAD and most likely this is also contributing. - Check HgbA1c today to ensure no contribution for new onset DM - Soak feet in [employer] salt nightly for 2 weeks, sent patient to [employer] store to get advice of OTC sole inserts to help with proper support.  - Consider referral to sports medicine for custom orthotics if no improvement with OTC inserts.

## 2018-07-10 NOTE — Patient Instructions (Addendum)
It was great to meet you today! Thank you for letting me participate in your care!  Today, we discussed several things. Your acute exacerbation of COPD is much improved. Please finish taking Prednisone and pick up the inhalers I sent to the pharmacy. Begin using them today as prescribed.   I will call you and let you know which medication we are starting to help better control your blood pressure control.  I will have you return to the lab in one week and then follow up with your doctor in one month.  Be well, Harolyn Rutherford, DO PGY-2, [facility]

## 2018-07-10 NOTE — Assessment & Plan Note (Addendum)
Acute exacerbation of COPD improved. Patient has not had PFTs since 2018. Was on Dulera, Spiriva, and Albuterol. Patient had stopped all medications on his own due to feeling like "they made me bad". Willing to restart today. - Restart Dulera 100-5mg  2 puffs BID - cont Albuterol inhaler prn - F/u in 1 month; if still having attacks consider adding back Spiriva

## 2018-07-10 NOTE — Progress Notes (Signed)
Subjective: No chief complaint on file.   HPI: Kevin [name] is a 63 y.o. presenting to clinic today to discuss the following:  Acute COPD Exacerbation Patient states about one week ago he woke up from sleep struggling to breath. He states he has had these attacks in the past and had two in the past week which led to him being seen in the urgent care. He was given albuterol nebulizer and prednisone. Today, from a breathing standpoint he feels much improved and states he can breath comfortably at rest. He still has some SOB on exertion but it is getting better. He states he has no more coughing, wheezing, and the nebulizer treatments and prednisone seem to be helping. He does have a history of CHF.  HTN Patient has history of HTN, was hospitalized and required ICU level care several years ago due to reported HTN emergency with multiple end organ damage. He states today he was told at his recent Urgent Care visit his blood pressure was "very hight". It is elevated today in office. He does not check it at home. He takes Carvedilol 12.'5mg'$  BID which he gets from his "kidney doctor".   Bilateral Foot Pain Patient states he works and is on his feet all day. He developed calluses on the bottom of his feet that hurt when standing and walking. He had been to a "foot doctor" recently and he said all they did was shave the calluses down and then "sent me a big bill". He would like something to help make walking easier.  ROS noted in HPI.   Past Medical, Surgical, Social, and Family History Reviewed & Updated per EMR.   Pertinent Historical Findings include:   Social History   Tobacco Use  Smoking Status Current Every Day Smoker  . Packs/day: 0.25  . Years: 42.00  . Pack years: 10.50  . Types: Cigarettes  Smokeless Tobacco Never Used    Objective: BP (!) 140/100   Pulse 65   SpO2 97%  Vitals and nursing notes reviewed  Physical Exam Gen: Alert and Oriented x 3, NAD HEENT:  Normocephalic, atraumatic CV: RRR, no murmurs, normal S1, S2 split Resp: CTAB, no wheezing, rales, or rhonchi, comfortable work of breathing MSK: multiple calluses on both feet in pressure areas of the foot. No ulcers or signs of skin breakdown or necrotic tissue Ext: no clubbing, cyanosis, or edema Neuro: No gross deficits Skin: warm, dry, intact, no rashes  No results found for this or any previous visit (from the past 72 hour(s)).  Assessment/Plan:  Essential hypertension Patient had a reported BP of >180/100 in urgent care and is 140/100 in clinic today. eGFR is 61 at last check so would benefit from starting ARB. - Cont Coreg 12.'5mg'$  BID - BMP today; if GFR is >60 will start ARB recheck BMP in one week - if GFR is < 60 will start amlodipine - If BP is still not to goal after starting second medication would lean to increasing Coreg - F/u in one month  Chronic obstructive pulmonary disease with acute exacerbation (HCC) Acute exacerbation of COPD improved. Patient has not had PFTs since 2018. Was on Dulera, Spiriva, and Albuterol. Patient had stopped all medications on his own due to feeling like "they made me bad". Willing to restart today. - Restart Dulera 100-'5mg'$  2 puffs BID - cont Albuterol inhaler prn - F/u in 1 month; if still having attacks consider adding back Spiriva  Callus of foot Multiple calluses  of both feet but no signs of necrotic tissue, no uclers.On his feet most of the day and walking due to demands of his job. Has history of PAD and most likely this is also contributing. - Check HgbA1c today to ensure no contribution for new onset DM - Soak feet in [employer] salt nightly for 2 weeks, sent patient to [employer] store to get advice of OTC sole inserts to help with proper support.  - Consider referral to sports medicine for custom orthotics if no improvement with OTC inserts.    PATIENT EDUCATION PROVIDED: See AVS    Diagnosis and plan along with any newly prescribed  medication(s) were discussed in detail with this patient today. The patient verbalized understanding and agreed with the plan. Patient advised if symptoms worsen return to clinic or ER.    Orders Placed This Encounter  Procedures  . Basic Metabolic Panel  . CBC with Differential    Meds ordered this encounter  Medications  . mometasone-formoterol (DULERA) 100-5 MCG/ACT AERO    Sig: Inhale 2 puffs into the lungs 2 (two) times a day.    Dispense:  13 g    Refill:  2  . albuterol (VENTOLIN HFA) 108 (90 Base) MCG/ACT inhaler    Sig: Inhale 2 puffs into the lungs every 4 (four) hours as needed for wheezing or shortness of breath.    Dispense:  1 Inhaler    Refill:  0     Harolyn Rutherford, DO 07/10/2018, 9:54 AM PGY-2 [place]

## 2018-07-10 NOTE — Assessment & Plan Note (Addendum)
Patient had a reported BP of >180/100 in urgent care and is 140/100 in clinic today. eGFR is 61 at last check so would benefit from starting ARB. - Cont Coreg 12.'5mg'$  BID - BMP today; if GFR is >60 will start ARB recheck BMP in one week - if GFR is < 60 will start amlodipine - If BP is still not to goal after starting second medication would lean to increasing Coreg - F/u in one month

## 2018-07-11 ENCOUNTER — Other Ambulatory Visit: Payer: Self-pay | Admitting: Family Medicine

## 2018-07-11 LAB — CBC WITH DIFFERENTIAL/PLATELET
Basophils Absolute: 0 10*3/uL (ref 0.0–0.2)
Basos: 0 %
EOS (ABSOLUTE): 0 10*3/uL (ref 0.0–0.4)
Eos: 0 %
Hematocrit: 38.1 % (ref 37.5–51.0)
Hemoglobin: 12.6 g/dL — ABNORMAL LOW (ref 13.0–17.7)
Immature Grans (Abs): 0 10*3/uL (ref 0.0–0.1)
Immature Granulocytes: 0 %
Lymphocytes Absolute: 1.5 10*3/uL (ref 0.7–3.1)
Lymphs: 12 %
MCH: 31.5 pg (ref 26.6–33.0)
MCHC: 33.1 g/dL (ref 31.5–35.7)
MCV: 95 fL (ref 79–97)
Monocytes Absolute: 0.6 10*3/uL (ref 0.1–0.9)
Monocytes: 5 %
Neutrophils Absolute: 10.7 10*3/uL — ABNORMAL HIGH (ref 1.4–7.0)
Neutrophils: 83 %
Platelets: 276 10*3/uL (ref 150–450)
RBC: 4 x10E6/uL — ABNORMAL LOW (ref 4.14–5.80)
RDW: 13.2 % (ref 11.6–15.4)
WBC: 12.9 10*3/uL — ABNORMAL HIGH (ref 3.4–10.8)

## 2018-07-11 LAB — HEMOGLOBIN A1C
Est. average glucose Bld gHb Est-mCnc: 97 mg/dL
Hgb A1c MFr Bld: 5 % (ref 4.8–5.6)

## 2018-07-11 LAB — BASIC METABOLIC PANEL
BUN/Creatinine Ratio: 12 (ref 10–24)
BUN: 17 mg/dL (ref 8–27)
CO2: 19 mmol/L — ABNORMAL LOW (ref 20–29)
Calcium: 9.5 mg/dL (ref 8.6–10.2)
Chloride: 107 mmol/L — ABNORMAL HIGH (ref 96–106)
Creatinine, Ser: 1.37 mg/dL — ABNORMAL HIGH (ref 0.76–1.27)
GFR calc Af Amer: 64 mL/min/{1.73_m2} (ref >59)
GFR calc non Af Amer: 56 mL/min/{1.73_m2} — ABNORMAL LOW (ref >59)
Glucose: 95 mg/dL (ref 65–99)
Potassium: 4.9 mmol/L (ref 3.5–5.2)
Sodium: 138 mmol/L (ref 134–144)

## 2018-07-11 MED ORDER — LOSARTAN POTASSIUM 25 MG PO TABS: 25.0000 mg | ORAL_TABLET | Freq: Every day | ORAL | 3 refills | Status: DC

## 2018-07-11 NOTE — Progress Notes (Signed)
Starting patient on ARB losartan for better BP control. eGFR was 64. He knows to return in one week to repeat BMP.   He will follow up in one month as I have started him on several medications that he stopped for his chronic diseases.

## 2018-08-13 ENCOUNTER — Telehealth: Payer: Self-pay | Admitting: Family Medicine

## 2018-08-13 DIAGNOSIS — Z0271 Encounter for disability determination: Secondary | ICD-10-CM

## 2018-08-13 NOTE — Addendum Note (Signed)
Addended by: Coral Else on: 08/13/2018 09:52 AM   Modules accepted: Orders

## 2018-08-13 NOTE — Telephone Encounter (Signed)
Patient sent in disability paperwork, I called him and asked him to call back.  Please read this message:  "Dr. Criss Rosales got your request for disability paperwork.  He does not do that kind of paperwork but has put in a referal to occupational health for you since they do it.  You will want to talk to them about the costs associated but this will at least allow you to have the conversation.  He will put your blank paperwork at the front desk for you, if you want [name] to mail it to you instead please let us know and we can do that"  -Dr. Criss Rosales

## 2018-08-25 ENCOUNTER — Other Ambulatory Visit: Payer: Self-pay | Admitting: Family Medicine

## 2018-09-12 IMAGING — DX DG CHEST 2V
2 series · 2 of 2 positions shown · non-contrast
Comparison: 03/04/2016

CLINICAL DATA: Syncopal episode today with chest pain

EXAM:
CHEST  2 VIEW

[chest lat]
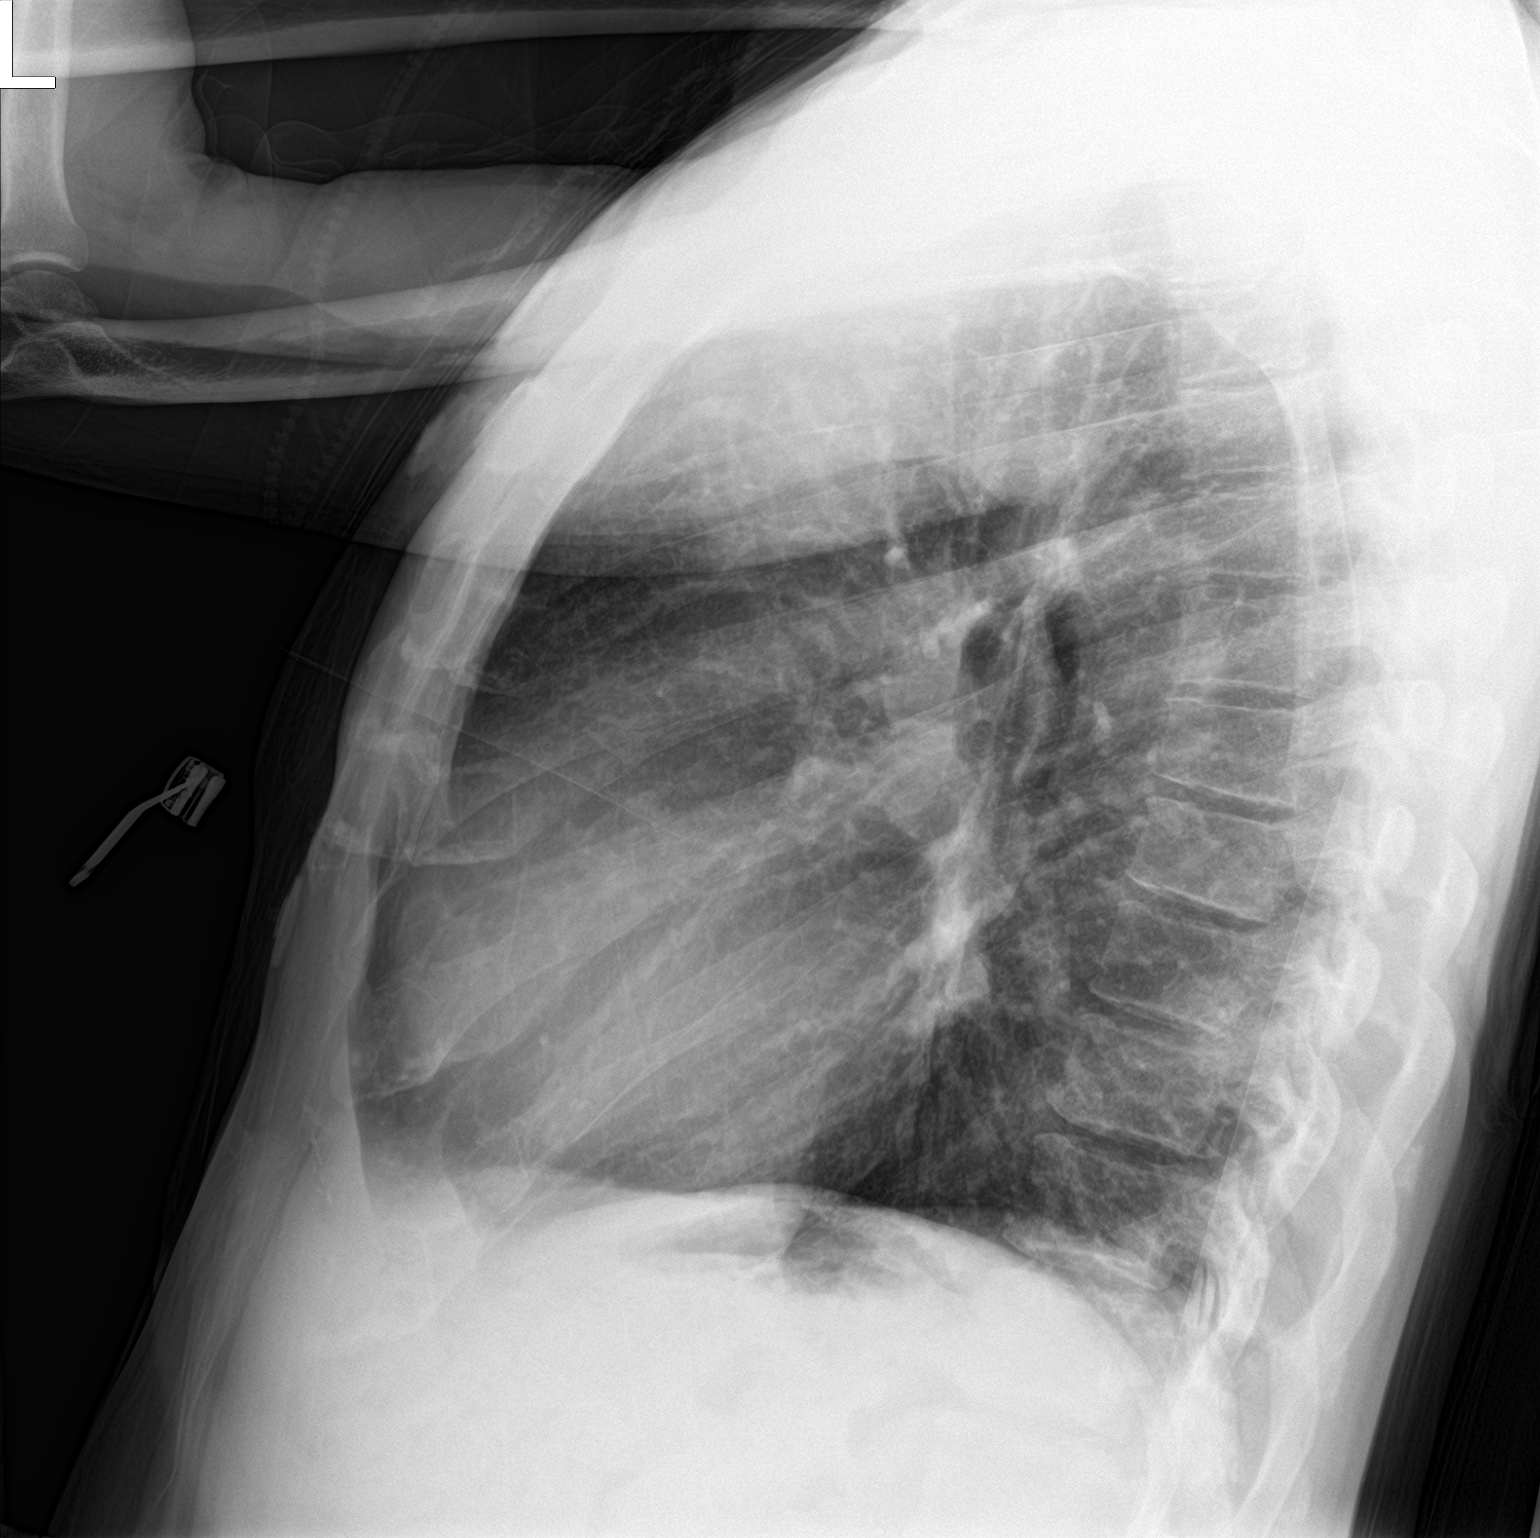

[chest ap]
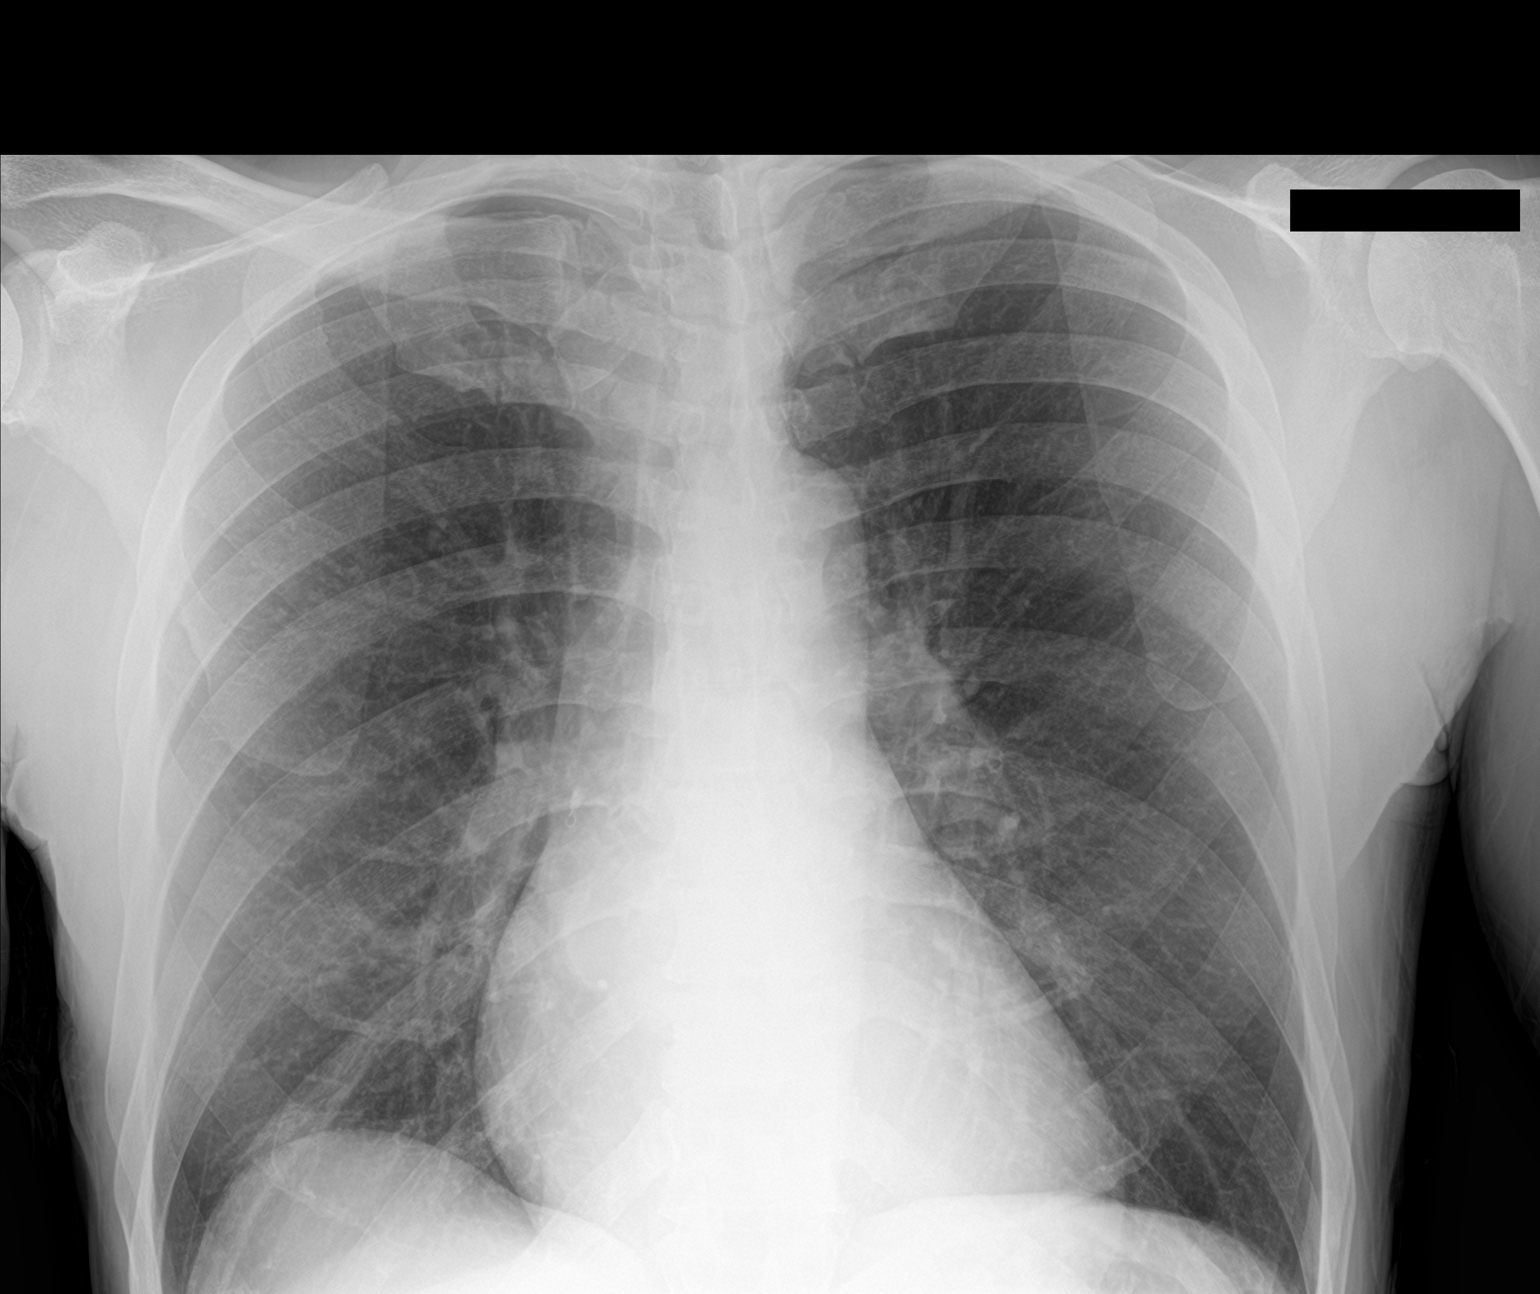

[2 of 2 positions shown; findings below may reference images not displayed]

FINDINGS: The heart size and mediastinal contours are within normal limits.
Both lungs are clear. The visualized skeletal structures are
unremarkable.
IMPRESSION: No active cardiopulmonary disease.

## 2018-09-12 IMAGING — CT CT HEAD W/O CM
3 series · 14 of 46 positions shown, 16 images · non-contrast
Comparison: Head CT dated 12/25/2016

CLINICAL DATA: 61-year-old male with syncope.  Frontal headache.

EXAM:
CT HEAD WITHOUT CONTRAST
TECHNIQUE: Contiguous axial images were obtained from the base of the skull
through the vertex without intravenous contrast.

[Series 3: head 5.0 h30s · axial · 0.41mm/px · z∈[-109,+11]mm · 8 of 29 slices shown, 10 images]
[im 3/29  brain]
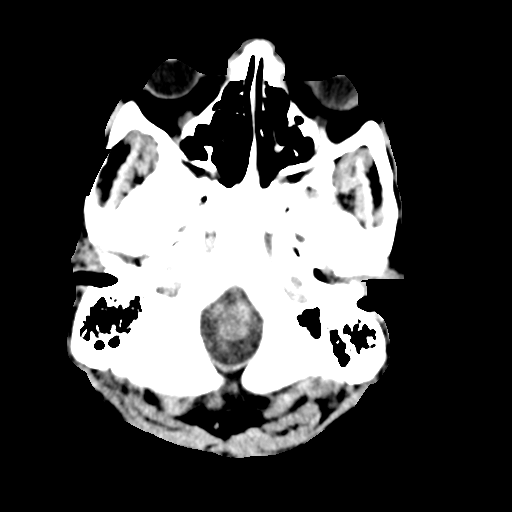
[im 3/29  bone]
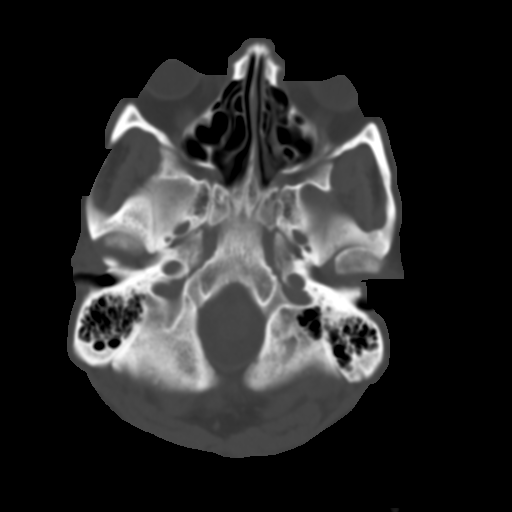
[im 7/29  brain]
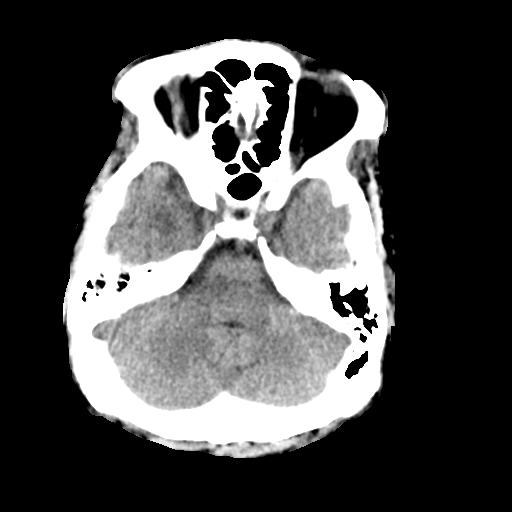
[im 10/29  brain]
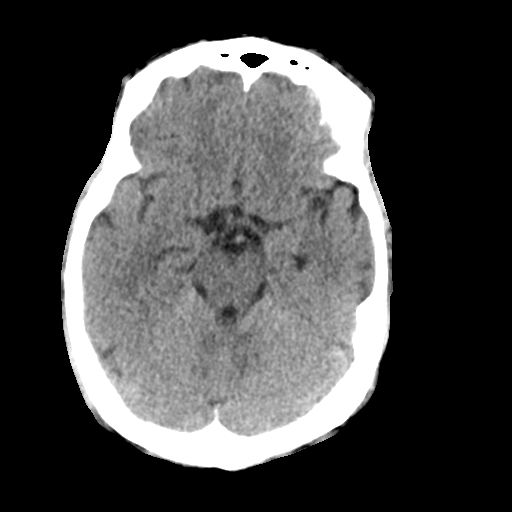
[im 13/29  brain]
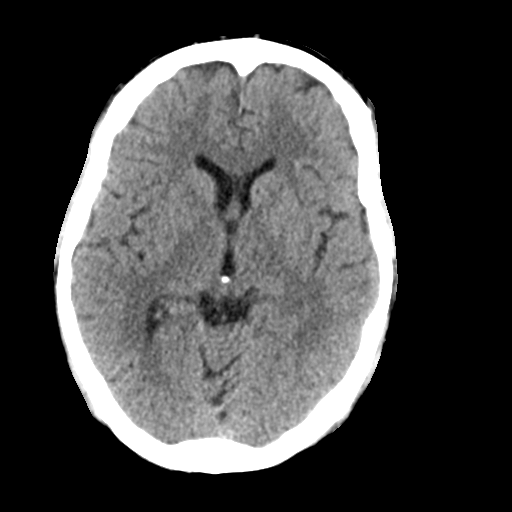
[im 17/29  brain]
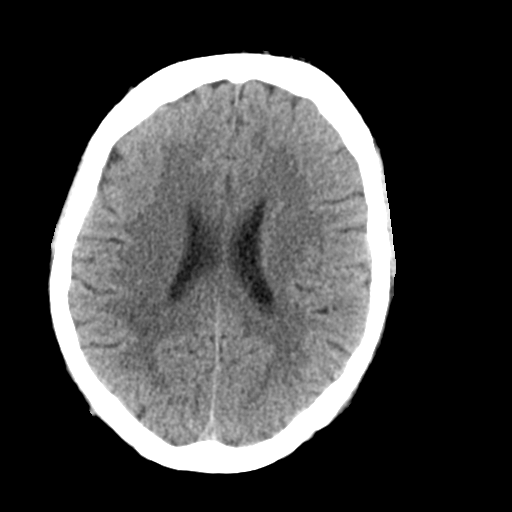
[im 17/29  bone]
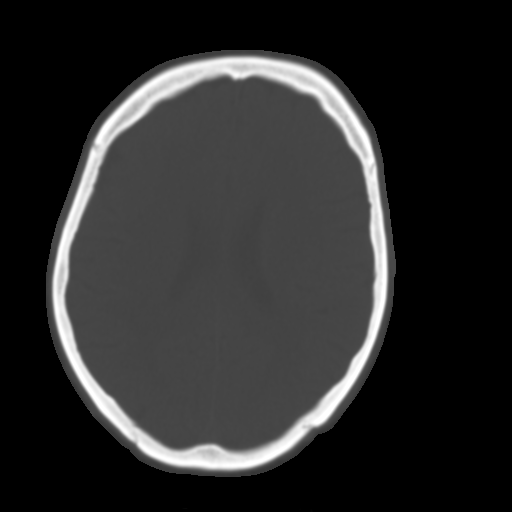
[im 20/29  brain]
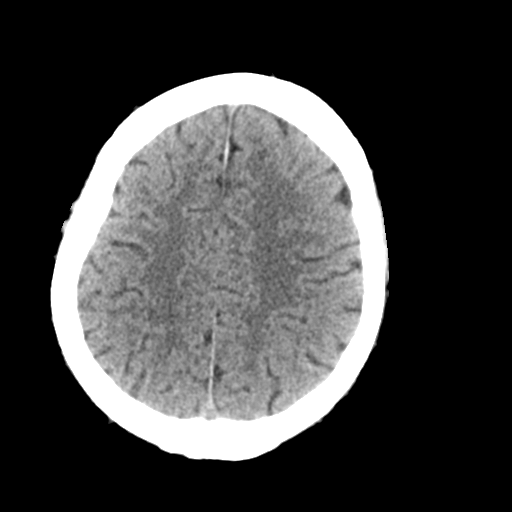
[im 23/29  brain]
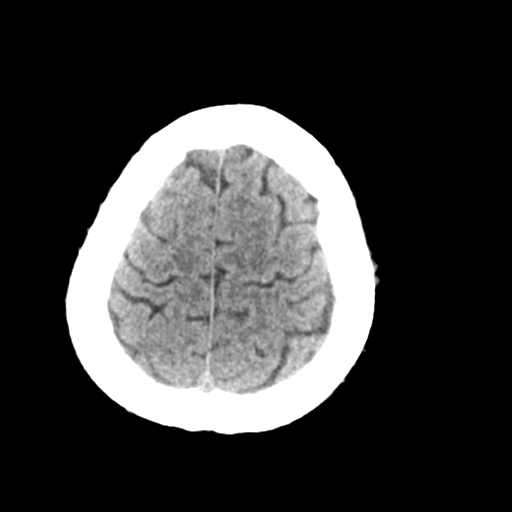
[im 27/29  brain]
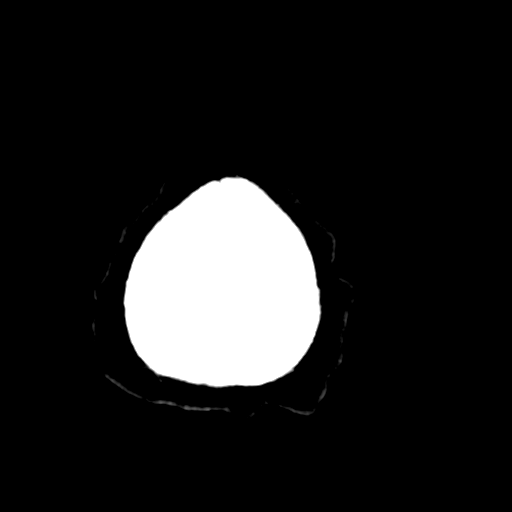

[Series 5: head 3.0 mpr cor · coronal · 0.28mm/px · 3 of 67 slices shown]
[im 23/67  brain]
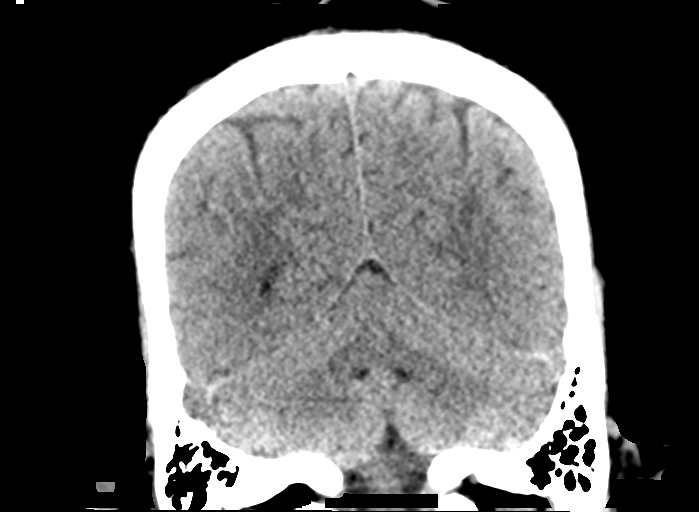
[im 30/67  brain]
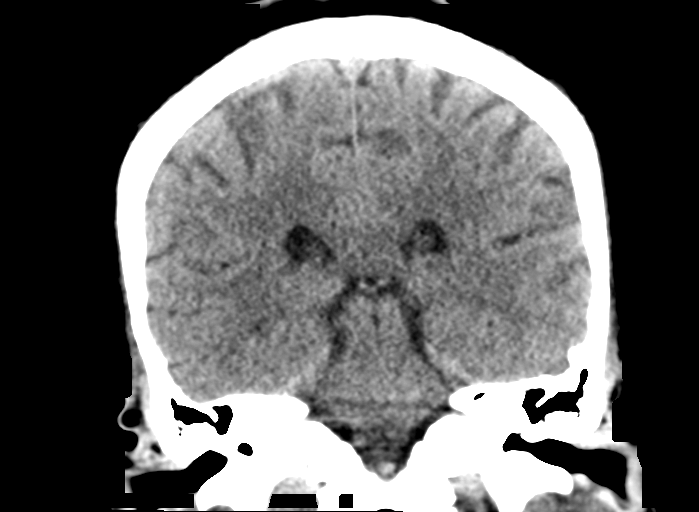
[im 37/67  brain]
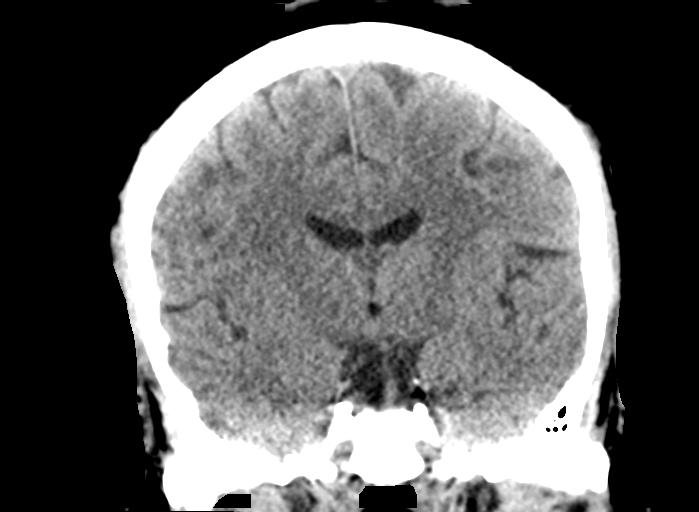

[Series 6: head 3.0 mpr sag · sagittal · 0.29mm/px · 3 of 67 slices shown]
[im 23/67  brain]
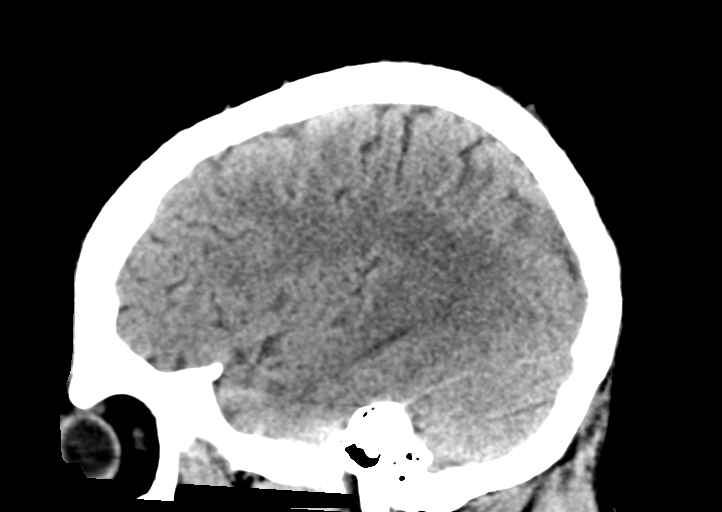
[im 34/67  brain]
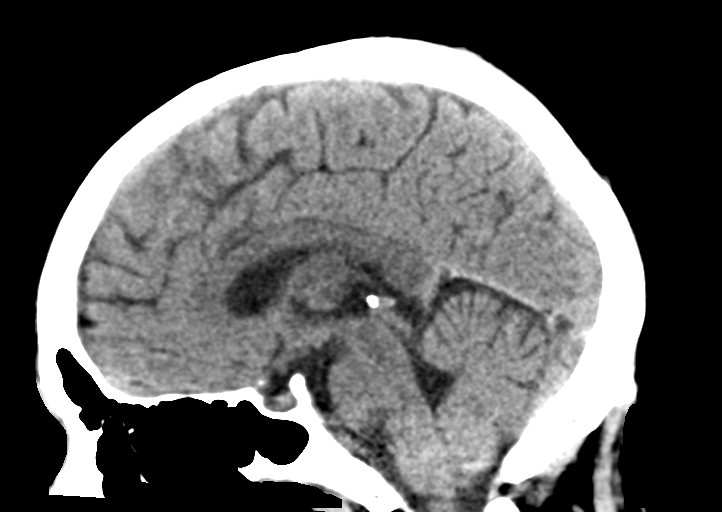
[im 45/67  brain]
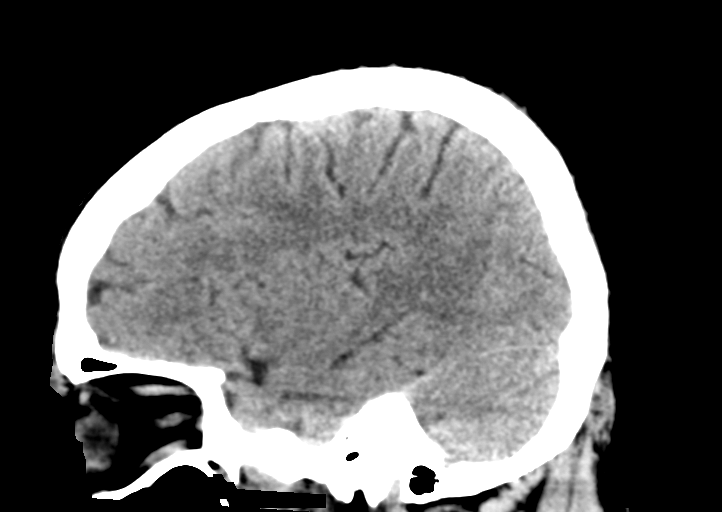

[14 of 46 positions shown; findings below may reference images not displayed]

FINDINGS: Brain: The ventricles and sulci appropriate size for patient's age.
Mild periventricular and deep white matter chronic microvascular
ischemic changes noted. Faint focal right parietal convexity area of
white matter hypodensity (series 3, image 19), likely present on the
prior CT and related to chronic microvascular ischemic changes. If
there is clinical concern for acute ischemia further evaluation with
MRI recommended. There is no acute intracranial hemorrhage. No mass
effect or midline shift. No extra-axial fluid collection.

Vascular: No hyperdense vessel or unexpected calcification.

Skull: Normal. Negative for fracture or focal lesion.

Sinuses/Orbits: Mild diffuse mucoperiosteal thickening of paranasal
sinuses. No air-fluid levels. The mastoid air cells are clear.

Other: None
IMPRESSION: 1. No acute intracranial hemorrhage.
2. Mild chronic microvascular ischemic changes. Focal faint
hypodense area in the right parietal convexity white matter, likely
chronic. Clinical correlation is recommended. If there is clinical
concern for acute ischemia further evaluation with MRI recommended.

## 2018-12-09 ENCOUNTER — Other Ambulatory Visit: Payer: Self-pay

## 2018-12-09 ENCOUNTER — Ambulatory Visit (INDEPENDENT_AMBULATORY_CARE_PROVIDER_SITE_OTHER): Payer: Medicaid Other | Admitting: Family Medicine

## 2018-12-09 ENCOUNTER — Encounter: Payer: Self-pay | Admitting: Family Medicine

## 2018-12-09 VITALS — BP 150/106 | HR 58 | Temp 98.9°F | Wt 153.0 lb

## 2018-12-09 DIAGNOSIS — J441 Chronic obstructive pulmonary disease with (acute) exacerbation: Secondary | ICD-10-CM | POA: Diagnosis present

## 2018-12-09 MED ORDER — PREDNISONE 10 MG PO TABS: 10.0000 mg | ORAL_TABLET | Freq: Every day | ORAL | 0 refills | Status: AC

## 2018-12-09 MED ORDER — DULERA 100-5 MCG/ACT IN AERO: 2.0000 | INHALATION_SPRAY | Freq: Every morning | RESPIRATORY_TRACT | 2 refills | Status: DC

## 2018-12-09 MED ORDER — AMOXICILLIN-POT CLAVULANATE 500-125 MG PO TABS: 500.0000 mg | ORAL_TABLET | Freq: Three times a day (TID) | ORAL | 0 refills | Status: AC

## 2018-12-09 MED ORDER — PREDNISONE 20 MG PO TABS: 40.0000 mg | ORAL_TABLET | Freq: Every day | ORAL | 0 refills | Status: AC

## 2018-12-09 NOTE — Progress Notes (Signed)
Spoke with patient's significant other, Dionne Milo, over the phone.  She states that the patient has had shortness of breath longer than 1 week.  Also states that she is very worried about patient's gout saying, he has very significant pain in his feet leading to anxiety causing him to breathe deeply and worsening his dyspnea symptoms.  She says that he also does not know how to use his albuterol inhaler correctly and wishes him to have teaching on this.  She also was curious why he was not put on a steroid taper as she said that he does not typically do well with just a 5-day course, and then he "crashes" becoming very lethargic.  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  I told her that I would prescribe a short taper given his history of not tolerating this well.  I said that we would address the "gout "as follow-up visit which is scheduled on a few days.  For which we will also check on his breathing.  Patient appreciated this.  I will also send a note to Dr. Andria Frames and Dr. Everitt Amber to see if they can provide patient any inhaler teaching at his next appointment visit.

## 2018-12-09 NOTE — Addendum Note (Signed)
Addended by: Josephine Igo B on: 12/09/2018 03:52 PM   Modules accepted: Orders

## 2018-12-09 NOTE — Patient Instructions (Signed)
It was a pleasure to see you today! Thank you for choosing [facility] for your primary care. Kevin [name] was seen for COPD exacerbation.   1.  We have prescribed you a course of antibiotics and steroids.  You will take this for 5 days.  2.  Please follow-up in clinic in 3 days to check up on your breathing.   Best,  Marny Lowenstein, MD, MS FAMILY MEDICINE RESIDENT - PGY3 12/09/2018 10:13 AM

## 2018-12-09 NOTE — Assessment & Plan Note (Addendum)
Patient presenting with mild COPD exacerbation with increased sputum production, may be mildly purulent, increased dyspnea.  Mild wheeze heard on exam.  Has normal oxygen saturation.  Patient is afebrile.  Patient has a history of CHF, EF in 2018 was 55 to 60%.  Patient is without chest pain, lower extremity swelling, no JVD, unlikely to be CHF exacerbation.  Patient with history of PAF, heart rhythm feels regular today and it is within normal rate, unlikely to be related to arrhythmia.  Will treat with 5-day course of Augmentin and prednisone.  Patient to follow-up in 2 to 3 days to reassess breathing.  We have refilled patient's Dulera as well.  Precepted with Dr. McDiarmid.

## 2018-12-09 NOTE — Progress Notes (Signed)
    Subjective:  Kevin [name] is a 63 y.o. male who presents to the [facility] today with a chief complaint of shortness of breath.   HPI:  Patient with history of COPD, atrial fibrillation, heart failure presents today with 1 week of shortness of breath.  Patient says that he has been out of his [facility] inhaler.  He has been having to use his albuterol inhaler 3-4 times a day.  This does help with shortness of breath.  Patient says that he has had some cough, although mild.  Also endorses thick, white sputum that has been coughing up.  Denies any fevers or chills.  Denies being around anybody sick.  Denies any wheezing.  Shortness of breath is worse when walking.  Denies any lower extremity swelling.  Denies chest pain.  Patient is still smoking 4 cigarettes a day.  ROS: Per HPI   Objective:  Physical Exam: BP (!) 150/106   Pulse (!) 58   Temp 98.9 F (37.2 C) (Oral)   Wt 153 lb (69.4 kg)   SpO2 97%   BMI 20.75 kg/m   Gen: NAD, sitting in chair CV: RRR with no murmurs appreciated, no JVD Pulm: Mild expiratory wheeze heard in right lower lung field, remainder of lung sounds were normal, normal work of breathing MSK: no edema, cyanosis, or clubbing noted Skin: warm, dry Neuro: grossly normal, moves all extremities Psych: Normal affect and thought content  No results found for this or any previous visit (from the past 72 hour(s)).   Assessment/Plan:  Chronic obstructive pulmonary disease with acute exacerbation ([place]) Patient presenting with mild COPD exacerbation with increased sputum production, may be mildly purulent, increased dyspnea.  Mild wheeze heard on exam.  Has normal oxygen saturation.  Patient is afebrile.  Patient has a history of CHF, EF in 2018 was 55 to 60%.  Patient is without chest pain, lower extremity swelling, no JVD, unlikely to be CHF exacerbation.  Patient with history of PAF, heart rhythm feels regular today and it is within normal rate, unlikely to be related to  arrhythmia.  Will treat with 5-day course of Augmentin and prednisone.  Patient to follow-up in 2 to 3 days to reassess breathing.  We have refilled patient's Dulera as well.  Precepted with Dr. McDiarmid.   Lab Orders  No laboratory test(s) ordered today    Meds ordered this encounter  Medications  . amoxicillin-clavulanate (AUGMENTIN) 500-125 MG tablet    Sig: Take 1 tablet (500 mg total) by mouth 3 (three) times daily for 5 days.    Dispense:  15 tablet    Refill:  0  . predniSONE (DELTASONE) 20 MG tablet    Sig: Take 2 tablets (40 mg total) by mouth daily with breakfast for 5 days.    Dispense:  10 tablet    Refill:  0  . mometasone-formoterol (DULERA) 100-5 MCG/ACT AERO    Sig: Inhale 2 puffs into the lungs every morning.    Dispense:  13 g    Refill:  [place], MD, MS FAMILY MEDICINE RESIDENT - PGY3 12/09/2018 10:56 AM

## 2018-12-16 ENCOUNTER — Ambulatory Visit: Payer: Medicaid Other | Admitting: Family Medicine

## 2018-12-31 ENCOUNTER — Ambulatory Visit: Payer: Medicaid Other

## 2019-03-11 ENCOUNTER — Ambulatory Visit: Payer: Medicaid Other

## 2019-03-11 ENCOUNTER — Other Ambulatory Visit: Payer: Medicaid Other

## 2019-03-17 ENCOUNTER — Other Ambulatory Visit: Payer: Self-pay | Admitting: Pharmacist

## 2019-03-17 ENCOUNTER — Ambulatory Visit: Payer: Medicaid Other

## 2019-03-17 ENCOUNTER — Other Ambulatory Visit (HOSPITAL_COMMUNITY)
Admission: RE | Admit: 2019-03-17 | Discharge: 2019-03-17 | Disposition: A | Discharge status: Home / Self Care | Payer: Medicaid Other | Source: Ambulatory Visit | Attending: Infectious Disease | Admitting: Infectious Disease

## 2019-03-17 ENCOUNTER — Other Ambulatory Visit: Payer: Self-pay

## 2019-03-17 ENCOUNTER — Other Ambulatory Visit: Payer: Medicaid Other

## 2019-03-17 DIAGNOSIS — Z113 Encounter for screening for infections with a predominantly sexual mode of transmission: Secondary | ICD-10-CM | POA: Diagnosis present

## 2019-03-17 DIAGNOSIS — B20 Human immunodeficiency virus [HIV] disease: Secondary | ICD-10-CM

## 2019-03-17 DIAGNOSIS — Z79899 Other long term (current) drug therapy: Secondary | ICD-10-CM

## 2019-03-17 MED ORDER — BIKTARVY 50-200-25 MG PO TABS: 1.0000 | ORAL_TABLET | Freq: Every day | ORAL | 1 refills | Status: DC

## 2019-03-17 NOTE — Progress Notes (Signed)
Patient has been out of care and last seen in July 2019. He is getting labs today and has been scheduled for a return to care appt on 2/24 with [name]. I saw his significant other today for PrEP. She states that he has been out of [place] for a few weeks. She states that he has been taking it consistently up until then. Unsure where he was getting his refills because none were sent in recently. Will send in a refill for him to pick up at [facility] to last until he can be seen at the end of the month.

## 2019-03-18 LAB — URINE CYTOLOGY ANCILLARY ONLY
Chlamydia: NEGATIVE
Comment: NEGATIVE
Comment: NORMAL
Neisseria Gonorrhea: NEGATIVE

## 2019-03-18 LAB — T-HELPER CELL (CD4) - (RCID CLINIC ONLY)
CD4 % Helper T Cell: 46 % (ref 33–65)
CD4 T Cell Abs: 956 /uL (ref 400–1790)

## 2019-03-20 ENCOUNTER — Other Ambulatory Visit: Payer: Self-pay

## 2019-03-20 DIAGNOSIS — B20 Human immunodeficiency virus [HIV] disease: Secondary | ICD-10-CM

## 2019-03-20 LAB — CBC WITH DIFFERENTIAL/PLATELET
Absolute Monocytes: 496 cells/uL (ref 200–950)
Basophils Absolute: 19 cells/uL (ref 0–200)
Basophils Relative: 0.3 %
Eosinophils Absolute: 180 cells/uL (ref 15–500)
Eosinophils Relative: 2.9 %
HCT: 37.5 % — ABNORMAL LOW (ref 38.5–50.0)
Hemoglobin: 12.6 g/dL — ABNORMAL LOW (ref 13.2–17.1)
Lymphs Abs: 2387 cells/uL (ref 850–3900)
MCH: 30.7 pg (ref 27.0–33.0)
MCHC: 33.6 g/dL (ref 32.0–36.0)
MCV: 91.5 fL (ref 80.0–100.0)
MPV: 10 fL (ref 7.5–12.5)
Monocytes Relative: 8 %
Neutro Abs: 3119 cells/uL (ref 1500–7800)
Neutrophils Relative %: 50.3 %
Platelets: 296 10*3/uL (ref 140–400)
RBC: 4.1 10*6/uL — ABNORMAL LOW (ref 4.20–5.80)
RDW: 13 % (ref 11.0–15.0)
Total Lymphocyte: 38.5 %
WBC: 6.2 10*3/uL (ref 3.8–10.8)

## 2019-03-20 LAB — COMPREHENSIVE METABOLIC PANEL
AG Ratio: 1 (calc) (ref 1.0–2.5)
ALT: 8 U/L — ABNORMAL LOW (ref 9–46)
AST: 12 U/L (ref 10–35)
Albumin: 4.1 g/dL (ref 3.6–5.1)
Alkaline phosphatase (APISO): 66 U/L (ref 35–144)
BUN/Creatinine Ratio: 14 (calc) (ref 6–22)
BUN: 27 mg/dL — ABNORMAL HIGH (ref 7–25)
CO2: 26 mmol/L (ref 20–32)
Calcium: 9.4 mg/dL (ref 8.6–10.3)
Chloride: 104 mmol/L (ref 98–110)
Creat: 1.89 mg/dL — ABNORMAL HIGH (ref 0.70–1.25)
Globulin: 4 g/dL (calc) — ABNORMAL HIGH (ref 1.9–3.7)
Glucose, Bld: 108 mg/dL — ABNORMAL HIGH (ref 65–99)
Potassium: 3.8 mmol/L (ref 3.5–5.3)
Sodium: 137 mmol/L (ref 135–146)
Total Bilirubin: 0.4 mg/dL (ref 0.2–1.2)
Total Protein: 8.1 g/dL (ref 6.1–8.1)

## 2019-03-20 LAB — LIPID PANEL
Cholesterol: 207 mg/dL — ABNORMAL HIGH (ref <200)
HDL: 49 mg/dL (ref >=40)
LDL Cholesterol (Calc): 138 mg/dL (calc) — ABNORMAL HIGH
Non-HDL Cholesterol (Calc): 158 mg/dL (calc) — ABNORMAL HIGH (ref <130)
Total CHOL/HDL Ratio: 4.2 (calc) (ref <5.0)
Triglycerides: 98 mg/dL (ref <150)

## 2019-03-20 LAB — HIV-1 RNA QUANT-NO REFLEX-BLD
HIV 1 RNA Quant: 43600 copies/mL — ABNORMAL HIGH
HIV-1 RNA Quant, Log: 4.64 Log copies/mL — ABNORMAL HIGH

## 2019-03-20 LAB — RPR: RPR Ser Ql: NONREACTIVE

## 2019-03-26 ENCOUNTER — Ambulatory Visit: Payer: Medicaid Other | Admitting: Podiatry

## 2019-03-26 ENCOUNTER — Encounter: Payer: Self-pay | Admitting: Podiatry

## 2019-03-26 ENCOUNTER — Other Ambulatory Visit: Payer: Self-pay

## 2019-03-26 VITALS — Temp 96.8°F

## 2019-03-26 DIAGNOSIS — M7752 Other enthesopathy of left foot: Secondary | ICD-10-CM | POA: Diagnosis not present

## 2019-03-26 DIAGNOSIS — D492 Neoplasm of unspecified behavior of bone, soft tissue, and skin: Secondary | ICD-10-CM

## 2019-03-26 DIAGNOSIS — Q828 Other specified congenital malformations of skin: Secondary | ICD-10-CM

## 2019-03-26 DIAGNOSIS — M7751 Other enthesopathy of right foot: Secondary | ICD-10-CM

## 2019-03-26 DIAGNOSIS — M779 Enthesopathy, unspecified: Secondary | ICD-10-CM

## 2019-03-26 DIAGNOSIS — B079 Viral wart, unspecified: Secondary | ICD-10-CM

## 2019-03-26 NOTE — Progress Notes (Signed)
Subjective:   Patient ID: Kevin [name], male   DOB: 64 y.o.   MRN: [id number]   HPI Patient presents with severe lesion formation plantar both feet and inflammation fluid around the fifth metatarsal head bilateral that is painful also has nail diseases bilateral that are bothersome   Review of Systems  All other systems reviewed and are negative.       Objective:  Physical Exam Vitals and nursing note reviewed.  Constitutional:      Appearance: He is well-developed.  Pulmonary:     Effort: Pulmonary effort is normal.  Musculoskeletal:        General: Normal range of motion.  Skin:    General: Skin is warm.  Neurological:     Mental Status: He is alert.     Neurovascular status mildly diminished bilateral with patient found to have inflammation pain of the fifth MPJ both feet that are painful when pressed making shoe gear difficult and is noted to have severe lesion formation bilateral     Assessment:  Inflammatory capsulitis fifth MPJ bilateral lesion formation bilateral with pain     Plan:  H&P reviewed all conditions today I did sterile prep and injected the fifth MPJ plantar capsule 3 mg Kenalog 5 mg Xylocaine bilateral which was tolerated well by the patient and reappoint to recheck

## 2019-04-02 ENCOUNTER — Other Ambulatory Visit: Payer: Medicaid Other

## 2019-04-04 ENCOUNTER — Encounter (HOSPITAL_COMMUNITY): Payer: Self-pay | Admitting: *Deleted

## 2019-04-04 ENCOUNTER — Inpatient Hospital Stay (HOSPITAL_COMMUNITY): Payer: Medicaid Other

## 2019-04-04 ENCOUNTER — Inpatient Hospital Stay (HOSPITAL_COMMUNITY)
Admission: EM | Admit: 2019-04-04 | Discharge: 2019-04-05 | DRG: 311 | Disposition: A | Discharge status: Home / Self Care | Payer: Medicaid Other | Attending: Cardiovascular Disease | Admitting: Cardiovascular Disease

## 2019-04-04 ENCOUNTER — Other Ambulatory Visit: Payer: Self-pay

## 2019-04-04 ENCOUNTER — Emergency Department (HOSPITAL_COMMUNITY): Payer: Medicaid Other

## 2019-04-04 DIAGNOSIS — E785 Hyperlipidemia, unspecified: Secondary | ICD-10-CM | POA: Diagnosis present

## 2019-04-04 DIAGNOSIS — I130 Hypertensive heart and chronic kidney disease with heart failure and stage 1 through stage 4 chronic kidney disease, or unspecified chronic kidney disease: Secondary | ICD-10-CM | POA: Diagnosis present

## 2019-04-04 DIAGNOSIS — I2510 Atherosclerotic heart disease of native coronary artery without angina pectoris: Secondary | ICD-10-CM | POA: Diagnosis present

## 2019-04-04 DIAGNOSIS — M109 Gout, unspecified: Secondary | ICD-10-CM | POA: Diagnosis present

## 2019-04-04 DIAGNOSIS — I10 Essential (primary) hypertension: Secondary | ICD-10-CM

## 2019-04-04 DIAGNOSIS — F17210 Nicotine dependence, cigarettes, uncomplicated: Secondary | ICD-10-CM | POA: Diagnosis present

## 2019-04-04 DIAGNOSIS — G4733 Obstructive sleep apnea (adult) (pediatric): Secondary | ICD-10-CM | POA: Diagnosis present

## 2019-04-04 DIAGNOSIS — N189 Chronic kidney disease, unspecified: Secondary | ICD-10-CM

## 2019-04-04 DIAGNOSIS — I5032 Chronic diastolic (congestive) heart failure: Secondary | ICD-10-CM | POA: Diagnosis present

## 2019-04-04 DIAGNOSIS — R0602 Shortness of breath: Secondary | ICD-10-CM

## 2019-04-04 DIAGNOSIS — I480 Paroxysmal atrial fibrillation: Secondary | ICD-10-CM | POA: Diagnosis present

## 2019-04-04 DIAGNOSIS — Z95820 Peripheral vascular angioplasty status with implants and grafts: Secondary | ICD-10-CM | POA: Diagnosis not present

## 2019-04-04 DIAGNOSIS — N1832 Chronic kidney disease, stage 3b: Secondary | ICD-10-CM | POA: Diagnosis present

## 2019-04-04 DIAGNOSIS — I5033 Acute on chronic diastolic (congestive) heart failure: Secondary | ICD-10-CM | POA: Diagnosis present

## 2019-04-04 DIAGNOSIS — Z832 Family history of diseases of the blood and blood-forming organs and certain disorders involving the immune mechanism: Secondary | ICD-10-CM

## 2019-04-04 DIAGNOSIS — M545 Low back pain: Secondary | ICD-10-CM | POA: Diagnosis present

## 2019-04-04 DIAGNOSIS — N184 Chronic kidney disease, stage 4 (severe): Secondary | ICD-10-CM | POA: Diagnosis not present

## 2019-04-04 DIAGNOSIS — I739 Peripheral vascular disease, unspecified: Secondary | ICD-10-CM | POA: Diagnosis present

## 2019-04-04 DIAGNOSIS — N179 Acute kidney failure, unspecified: Secondary | ICD-10-CM

## 2019-04-04 DIAGNOSIS — Z20822 Contact with and (suspected) exposure to covid-19: Secondary | ICD-10-CM | POA: Diagnosis present

## 2019-04-04 DIAGNOSIS — I249 Acute ischemic heart disease, unspecified: Secondary | ICD-10-CM

## 2019-04-04 DIAGNOSIS — J449 Chronic obstructive pulmonary disease, unspecified: Secondary | ICD-10-CM | POA: Diagnosis present

## 2019-04-04 DIAGNOSIS — Z21 Asymptomatic human immunodeficiency virus [HIV] infection status: Secondary | ICD-10-CM | POA: Diagnosis present

## 2019-04-04 DIAGNOSIS — I248 Other forms of acute ischemic heart disease: Secondary | ICD-10-CM | POA: Diagnosis not present

## 2019-04-04 DIAGNOSIS — R0600 Dyspnea, unspecified: Secondary | ICD-10-CM

## 2019-04-04 DIAGNOSIS — G8929 Other chronic pain: Secondary | ICD-10-CM | POA: Diagnosis present

## 2019-04-04 DIAGNOSIS — B20 Human immunodeficiency virus [HIV] disease: Secondary | ICD-10-CM | POA: Diagnosis present

## 2019-04-04 DIAGNOSIS — Z23 Encounter for immunization: Secondary | ICD-10-CM | POA: Diagnosis not present

## 2019-04-04 DIAGNOSIS — N183 Chronic kidney disease, stage 3 unspecified: Secondary | ICD-10-CM | POA: Diagnosis present

## 2019-04-04 DIAGNOSIS — E7800 Pure hypercholesterolemia, unspecified: Secondary | ICD-10-CM | POA: Diagnosis not present

## 2019-04-04 DIAGNOSIS — I252 Old myocardial infarction: Secondary | ICD-10-CM | POA: Diagnosis not present

## 2019-04-04 DIAGNOSIS — Z79899 Other long term (current) drug therapy: Secondary | ICD-10-CM | POA: Diagnosis not present

## 2019-04-04 DIAGNOSIS — R001 Bradycardia, unspecified: Secondary | ICD-10-CM | POA: Diagnosis present

## 2019-04-04 DIAGNOSIS — R0603 Acute respiratory distress: Secondary | ICD-10-CM | POA: Diagnosis not present

## 2019-04-04 LAB — BASIC METABOLIC PANEL
Anion gap: 10 (ref 5–15)
BUN: 26 mg/dL — ABNORMAL HIGH (ref 8–23)
CO2: 25 mmol/L (ref 22–32)
Calcium: 9 mg/dL (ref 8.9–10.3)
Chloride: 102 mmol/L (ref 98–111)
Creatinine, Ser: 2.07 mg/dL — ABNORMAL HIGH (ref 0.61–1.24)
GFR calc Af Amer: 39 mL/min — ABNORMAL LOW (ref >60)
GFR calc non Af Amer: 34 mL/min — ABNORMAL LOW (ref >60)
Glucose, Bld: 103 mg/dL — ABNORMAL HIGH (ref 70–99)
Potassium: 3.7 mmol/L (ref 3.5–5.1)
Sodium: 137 mmol/L (ref 135–145)

## 2019-04-04 LAB — URINALYSIS, ROUTINE W REFLEX MICROSCOPIC
Bilirubin Urine: NEGATIVE
Glucose, UA: NEGATIVE mg/dL
Hgb urine dipstick: NEGATIVE
Ketones, ur: NEGATIVE mg/dL
Leukocytes,Ua: NEGATIVE
Nitrite: NEGATIVE
Protein, ur: NEGATIVE mg/dL
Specific Gravity, Urine: 1.011 (ref 1.005–1.030)
pH: 5 (ref 5.0–8.0)

## 2019-04-04 LAB — LIPID PANEL
Cholesterol: 177 mg/dL (ref 0–200)
HDL: 43 mg/dL (ref >40)
LDL Cholesterol: 93 mg/dL (ref 0–99)
Total CHOL/HDL Ratio: 4.1 RATIO
Triglycerides: 206 mg/dL — ABNORMAL HIGH (ref <150)
VLDL: 41 mg/dL — ABNORMAL HIGH (ref 0–40)

## 2019-04-04 LAB — CBC
HCT: 34.3 % — ABNORMAL LOW (ref 39.0–52.0)
Hemoglobin: 11.8 g/dL — ABNORMAL LOW (ref 13.0–17.0)
MCH: 31.2 pg (ref 26.0–34.0)
MCHC: 34.4 g/dL (ref 30.0–36.0)
MCV: 90.7 fL (ref 80.0–100.0)
Platelets: 294 10*3/uL (ref 150–400)
RBC: 3.78 MIL/uL — ABNORMAL LOW (ref 4.22–5.81)
RDW: 13.1 % (ref 11.5–15.5)
WBC: 8 10*3/uL (ref 4.0–10.5)
nRBC: 0 % (ref 0.0–0.2)

## 2019-04-04 LAB — ECHOCARDIOGRAM COMPLETE
Height: 72 in
Weight: 2447.99 oz

## 2019-04-04 LAB — TROPONIN I (HIGH SENSITIVITY)
Troponin I (High Sensitivity): 70 ng/L — ABNORMAL HIGH (ref <18)
Troponin I (High Sensitivity): 64 ng/L — ABNORMAL HIGH (ref <18)
Troponin I (High Sensitivity): 67 ng/L — ABNORMAL HIGH (ref <18)

## 2019-04-04 LAB — I-STAT CHEM 8, ED
BUN: 32 mg/dL — ABNORMAL HIGH (ref 8–23)
Calcium, Ion: 1.18 mmol/L (ref 1.15–1.40)
Chloride: 105 mmol/L (ref 98–111)
Creatinine, Ser: 2.2 mg/dL — ABNORMAL HIGH (ref 0.61–1.24)
Glucose, Bld: 97 mg/dL (ref 70–99)
HCT: 36 % — ABNORMAL LOW (ref 39.0–52.0)
Hemoglobin: 12.2 g/dL — ABNORMAL LOW (ref 13.0–17.0)
Potassium: 3.9 mmol/L (ref 3.5–5.1)
Sodium: 141 mmol/L (ref 135–145)
TCO2: 32 mmol/L (ref 22–32)

## 2019-04-04 LAB — RESPIRATORY PANEL BY RT PCR (FLU A&B, COVID)
Influenza A by PCR: NEGATIVE
Influenza B by PCR: NEGATIVE
SARS Coronavirus 2 by RT PCR: NEGATIVE

## 2019-04-04 LAB — HEMOGLOBIN A1C
Hgb A1c MFr Bld: 5.4 % (ref 4.8–5.6)
Mean Plasma Glucose: 108.28 mg/dL

## 2019-04-04 LAB — HEPARIN LEVEL (UNFRACTIONATED): Heparin Unfractionated: 0.28 IU/mL — ABNORMAL LOW (ref 0.30–0.70)

## 2019-04-04 MED ORDER — REGADENOSON 0.4 MG/5ML IV SOLN
0.4000 mg | Freq: Once | INTRAVENOUS | Status: AC
  Administered 2019-04-04: 0.4 mg via INTRAVENOUS
  Filled 2019-04-04: qty 5

## 2019-04-04 MED ORDER — IPRATROPIUM-ALBUTEROL 0.5-2.5 (3) MG/3ML IN SOLN
3.0000 mL | Freq: Once | RESPIRATORY_TRACT | Status: AC
  Administered 2019-04-04: 05:00:00 3 mL via RESPIRATORY_TRACT

## 2019-04-04 MED ORDER — TECHNETIUM TC 99M TETROFOSMIN IV KIT
10.1000 | PACK | Freq: Once | INTRAVENOUS | Status: AC | PRN
  Administered 2019-04-04: 10.1 via INTRAVENOUS

## 2019-04-04 MED ORDER — ALBUTEROL SULFATE (2.5 MG/3ML) 0.083% IN NEBU: 2.5000 mg | INHALATION_SOLUTION | Freq: Four times a day (QID) | RESPIRATORY_TRACT | Status: DC | PRN

## 2019-04-04 MED ORDER — MOMETASONE FURO-FORMOTEROL FUM 100-5 MCG/ACT IN AERO
2.0000 | INHALATION_SPRAY | Freq: Every morning | RESPIRATORY_TRACT | Status: DC
  Filled 2019-04-04: qty 8.8

## 2019-04-04 MED ORDER — IPRATROPIUM-ALBUTEROL 0.5-2.5 (3) MG/3ML IN SOLN
RESPIRATORY_TRACT | Status: AC
  Administered 2019-04-04: 05:00:00 3 mL
  Filled 2019-04-04: qty 3

## 2019-04-04 MED ORDER — SODIUM CHLORIDE 0.9 % IV SOLN: INTRAVENOUS | Status: AC

## 2019-04-04 MED ORDER — AMLODIPINE BESYLATE 5 MG PO TABS
5.0000 mg | ORAL_TABLET | Freq: Every day | ORAL | Status: DC
  Administered 2019-04-04 – 2019-04-05 (×2): 5 mg via ORAL
  Filled 2019-04-04 (×2): qty 1

## 2019-04-04 MED ORDER — HEPARIN SODIUM (PORCINE) 5000 UNIT/ML IJ SOLN
INTRAMUSCULAR | Status: AC
  Filled 2019-04-04: qty 1

## 2019-04-04 MED ORDER — CARVEDILOL 12.5 MG PO TABS
12.5000 mg | ORAL_TABLET | Freq: Two times a day (BID) | ORAL | Status: DC
  Administered 2019-04-04 – 2019-04-05 (×2): 12.5 mg via ORAL
  Filled 2019-04-04 (×3): qty 1

## 2019-04-04 MED ORDER — PNEUMOCOCCAL VAC POLYVALENT 25 MCG/0.5ML IJ INJ
0.5000 mL | INJECTION | INTRAMUSCULAR | Status: AC
  Administered 2019-04-05: 09:00:00 0.5 mL via INTRAMUSCULAR
  Filled 2019-04-04: qty 0.5

## 2019-04-04 MED ORDER — HEPARIN (PORCINE) 25000 UT/250ML-% IV SOLN
950.0000 [IU]/h | INTRAVENOUS | Status: DC
  Administered 2019-04-04: 850 [IU]/h via INTRAVENOUS
  Administered 2019-04-05: 04:00:00 950 [IU]/h via INTRAVENOUS
  Filled 2019-04-04: qty 250

## 2019-04-04 MED ORDER — SODIUM CHLORIDE 0.9% FLUSH: 3.0000 mL | Freq: Once | INTRAVENOUS | Status: DC

## 2019-04-04 MED ORDER — LOSARTAN POTASSIUM 25 MG PO TABS: 25.0000 mg | ORAL_TABLET | Freq: Every day | ORAL | Status: DC

## 2019-04-04 MED ORDER — REGADENOSON 0.4 MG/5ML IV SOLN
INTRAVENOUS | Status: AC
  Filled 2019-04-04: qty 5

## 2019-04-04 MED ORDER — HEPARIN (PORCINE) 25000 UT/250ML-% IV SOLN
850.0000 [IU]/h | INTRAVENOUS | Status: DC
  Administered 2019-04-04: 03:00:00 850 [IU]/h via INTRAVENOUS
  Filled 2019-04-04: qty 250

## 2019-04-04 MED ORDER — INFLUENZA VAC SPLIT QUAD 0.5 ML IM SUSY
0.5000 mL | PREFILLED_SYRINGE | INTRAMUSCULAR | Status: AC
  Administered 2019-04-05: 0.5 mL via INTRAMUSCULAR
  Filled 2019-04-04: qty 0.5

## 2019-04-04 MED ORDER — HYDRALAZINE HCL 25 MG PO TABS
25.0000 mg | ORAL_TABLET | Freq: Once | ORAL | Status: AC
  Administered 2019-04-04: 25 mg via ORAL
  Filled 2019-04-04: qty 1

## 2019-04-04 MED ORDER — ONDANSETRON HCL 4 MG/2ML IJ SOLN
4.0000 mg | Freq: Once | INTRAMUSCULAR | Status: AC
  Administered 2019-04-04: 02:00:00 4 mg via INTRAVENOUS
  Filled 2019-04-04: qty 2

## 2019-04-04 MED ORDER — HEPARIN BOLUS VIA INFUSION
4000.0000 [IU] | Freq: Once | INTRAVENOUS | Status: AC
  Administered 2019-04-04: 02:00:00 4000 [IU] via INTRAVENOUS
  Filled 2019-04-04: qty 4000

## 2019-04-04 MED ORDER — TECHNETIUM TC 99M TETROFOSMIN IV KIT
31.0000 | PACK | Freq: Once | INTRAVENOUS | Status: AC | PRN
  Administered 2019-04-04: 31 via INTRAVENOUS

## 2019-04-04 MED ORDER — ASPIRIN 81 MG PO CHEW
324.0000 mg | CHEWABLE_TABLET | Freq: Once | ORAL | Status: AC
  Administered 2019-04-04: 324 mg via ORAL
  Filled 2019-04-04: qty 4

## 2019-04-04 MED ORDER — BICTEGRAVIR-EMTRICITAB-TENOFOV 50-200-25 MG PO TABS
1.0000 | ORAL_TABLET | Freq: Every day | ORAL | Status: DC
  Administered 2019-04-04 – 2019-04-05 (×2): 1 via ORAL
  Filled 2019-04-04 (×2): qty 1

## 2019-04-04 NOTE — Procedures (Signed)
Echo attempted in ED. Patient not in room. Will attempt again.

## 2019-04-04 NOTE — Progress Notes (Addendum)
ANTICOAGULATION CONSULT NOTE - Initial Consult  Pharmacy Consult for heparin Indication: chest pain/ACS  No Known Allergies  Patient Measurements: Height: 6' (182.9 cm) Weight: 153 lb (69.4 kg) IBW/kg (Calculated) : 77.6 Heparin Dosing Weight: 69.4 kg   Vital Signs: Temp: 98.6 F (37 C) (02/19 0854) Temp Source: Oral (02/19 0854) BP: 142/82 (02/19 0854) Pulse Rate: 56 (02/19 0854)  Labs: Recent Labs    04/04/19 0135 04/04/19 0210 04/04/19 0444  HGB 11.8* 12.2*  --   HCT 34.3* 36.0*  --   PLT 294  --   --   CREATININE 2.07* 2.20*  --   TROPONINIHS  --   --  70*    Estimated Creatinine Clearance: 34.6 mL/min (A) (by C-G formula based on SCr of 2.2 mg/dL (H)).   Medical History: Past Medical History:  Diagnosis Date  . AIDS (acquired immune deficiency syndrome) ([place]) 08/17/2016  . Chronic diastolic CHF (congestive heart failure), NYHA class 3 ([place]) 01/2016  . Chronic lower back pain   . CKD (chronic kidney disease), stage III   . COPD (chronic obstructive pulmonary disease) ([place])   . Gout    "forearms, hands, ankles, feet" (06/05/2016)  . Headache    "weekly" (06/05/2016)  . Heart murmur   . Hypertension   . Hypertensive crisis 08/15/2017  . OSA on CPAP   . PAD (peripheral artery disease) ([place])   . PAF (paroxysmal atrial fibrillation) ([place]) 01/2016    Medications:  Scheduled:  . bictegravir-emtricitabine-tenofovir AF  1 tablet Oral Daily  . carvedilol  12.5 mg Oral BID WC  . heparin      . losartan  25 mg Oral QHS  . mometasone-formoterol  2 puff Inhalation q morning - 10a  . sodium chloride flush  3 mL Intravenous Once    Assessment: 25 yom presented with SOB and diaphoresis. No AC PTA. Trop 70>64.   Received heparin bolus of 4000 units this AM at 0211 and started on 850 units/hr. Hgb 12.2, plt 294. No s/sx of bleeding.  Goal of Therapy:  Heparin level 0.3-0.7 units/ml Monitor platelets by anticoagulation protocol: Yes   Plan:  Continue heparin  infusion at 850 units/hr Order heparin level at 6 hours - timed for 1100 HL Monitor daily CBC, HL, and for s/sx of bleeding  Antonietta Jewel, PharmD, BCCCP Clinical Pharmacist  Phone: [phone]  Please check AMION for all [place] phone numbers After 10:00 PM, call [place] [phone] 04/04/2019,10:00 AM  ADDENDUM Initial heparin level came back slightly subtherapeutic at 0.28, on 850 units/hr. No s/sx of bleeding or infusion issues. No plan now for [facility] - plan for stress test, pending results can consider duration of heparin infusion. Will increase rate slightly to 950 units/hr. Monitor daily HL.  Antonietta Jewel, PharmD, [place] Clinical Pharmacist

## 2019-04-04 NOTE — ED Triage Notes (Signed)
Pt arrives from home via [facility], SOB, started about 1 hour ago. Woke up with SOB, diaphoresis and dizzy per his wife. Lung sounds clear. Hx of COPD. 140/80, hr 70, 95%, rr 18, 98.1.

## 2019-04-04 NOTE — Progress Notes (Addendum)
   MV results below, reviewed by Dr Oval Linsey.  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 Plus, Cr higher than usual so reluctant to cath unless very high risk stress test.   Plan: hydrate to get Cr heading in the right direction. D/c HCTZ and losartan Start amlodipine for BP control.  If doing better in am, with Cr improved and BP better, d/c then and f/u as oupt.   MYOVIEW RESULTS:  NM Myocar Multi W/Spect W/Wall Motion / EF  Result Date: 04/04/2019 CLINICAL DATA:  Chest pain EXAM: MYOCARDIAL IMAGING WITH SPECT (REST AND PHARMACOLOGIC-STRESS) GATED LEFT VENTRICULAR WALL MOTION STUDY LEFT VENTRICULAR EJECTION FRACTION TECHNIQUE: Standard myocardial SPECT imaging was performed after resting intravenous injection of 10 mCi Tc-80m tetrofosmin. Subsequently, intravenous infusion of Lexiscan was performed under the supervision of the Cardiology staff. At peak effect of the drug, 30 mCi Tc-56m tetrofosmin was injected intravenously and standard myocardial SPECT imaging was performed. Quantitative gated imaging was also performed to evaluate left ventricular wall motion, and estimate left ventricular ejection fraction. COMPARISON:  None. FINDINGS: Perfusion: Large fixed inferior wall defect extending to involve the apex. Apical component of the defect is slightly larger on the stress imaging, difficult to completely exclude a small amount of apical peri-infarct ischemia with pharmacologic stress. Wall Motion: Mild LV chamber dilatation.  Septal hypokinesis. Left Ventricular Ejection Fraction: 50 % End diastolic volume 812 ml End systolic volume 751 ml IMPRESSION: 1. Large fixed inferior wall defect extending to the apex. Apical component appears larger on stress imaging suspicious for small amount of apical peri-infarct inducible ischemia. 2. Septal hypokinesis. 3. Left ventricular ejection fraction 50% 4. Non invasive risk stratification*: Intermediate *2012 Appropriate Use Criteria for Coronary  Revascularization Focused Update: J Am Coll Cardiol. 7001;74(9):449-675. http://content.airportbarriers.com.aspx?articleid=[email or web address] Electronically Signed   By: Jerilynn Mages.  Shick M.D.   On: 04/04/2019 14:31   DG Chest Port 1 View  Result Date: 04/04/2019 CLINICAL DATA:  Awoke with shortness of breath. EXAM: PORTABLE CHEST 1 VIEW COMPARISON:  07/09/2018 FINDINGS: Low lung volumes.The cardiomediastinal contours are normal. Upper normal heart size likely accentuated by technique pulmonary vasculature is normal. No consolidation, pleural effusion, or pneumothorax. No acute osseous abnormalities are seen. IMPRESSION: Low lung volumes without acute findings. Electronically Signed   By: Keith Rake M.D.   On: 04/04/2019 02:08    [name], PA-C 04/04/2019 3:38 PM

## 2019-04-04 NOTE — ED Provider Notes (Signed)
[facility] EMERGENCY DEPARTMENT Provider Note   CSN: [id number] Arrival date & time: 04/04/19  0126     History Chief Complaint  Patient presents with  . Shortness of Breath  . Code STEMI    Kevin [name] is a 64 y.o. male.  The history is provided by the patient.  Shortness of Breath Severity:  Severe Onset quality:  Sudden Duration:  2 hours Timing:  Constant Progression:  Unchanged Chronicity:  New Context: not activity   Relieved by:  Nothing Worsened by:  Nothing Ineffective treatments:  None tried Associated symptoms: diaphoresis   Associated symptoms: no chest pain, no cough, no fever, no sputum production and no vomiting   Risk factors: no recent alcohol use   Awoke with sudden onset SOB, BUE pain, nausea and diaphoresis.  No f/c/r.  No cough, no wheezing.       Past Medical History:  Diagnosis Date  . AIDS (acquired immune deficiency syndrome) ([place]) 08/17/2016  . Chronic diastolic CHF (congestive heart failure), NYHA class 3 ([place]) 01/2016  . Chronic lower back pain   . CKD (chronic kidney disease), stage III   . COPD (chronic obstructive pulmonary disease) ([place])   . Gout    "forearms, hands, ankles, feet" (06/05/2016)  . Headache    "weekly" (06/05/2016)  . Heart murmur   . Hypertension   . Hypertensive crisis 08/15/2017  . OSA on CPAP   . PAD (peripheral artery disease) ([place])   . PAF (paroxysmal atrial fibrillation) ([place]) 01/2016    Patient Active Problem List   Diagnosis Date Noted  . Callus of foot 07/10/2018  . Hypertensive crisis 08/15/2017  . Coronary artery disease involving native coronary artery of native heart with unstable angina pectoris ([place]) 10/19/2016  . Easy bruising 08/11/2016  . Claudication in peripheral vascular disease ([place]) 06/05/2016  . Stage 3 chronic kidney disease   . Peripheral arterial disease ([place]) 05/09/2016  . Acute on chronic diastolic CHF (congestive heart failure), NYHA class 3 ([place]) 03/24/2016    . OSA (obstructive sleep apnea) 03/24/2016  . Essential hypertension 02/18/2016  . 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 Shortness of breath 02/07/2016  . PAF (paroxysmal atrial fibrillation) ([place]) 02/07/2016  . Tobacco abuse 02/07/2016  . Acute on chronic renal insufficiency 02/07/2016  . Chronic obstructive pulmonary disease with acute exacerbation ([place]) 02/07/2016  . Hypertensive emergency 02/07/2016  . HIV disease ([place]) 08/27/2004    Past Surgical History:  Procedure Laterality Date  . LEFT HEART CATH AND CORONARY ANGIOGRAPHY N/A 10/23/2016   Procedure: LEFT HEART CATH AND CORONARY ANGIOGRAPHY;  Surgeon: Leonie Man, MD;  Location: [facility];  Service: Cardiovascular;  Laterality: N/A;  . LOWER EXTREMITY ANGIOGRAPHY N/A 07/17/2016   Procedure: Lower Extremity Angiography;  Surgeon: Lorretta Harp, MD;  Location: [facility];  Service: Cardiovascular;  Laterality: N/A;  . LOWER EXTREMITY INTERVENTION N/A 06/05/2016   Procedure: Lower Extremity Intervention;  Surgeon: Lorretta Harp, MD;  Location: [facility];  Service: Cardiovascular;  Laterality: N/A;  . PERIPHERAL VASCULAR ATHERECTOMY Right 07/17/2016   Procedure: Peripheral Vascular Atherectomy;  Surgeon: Lorretta Harp, MD;  Location: [facility];  Service: Cardiovascular;  Laterality: Right;  SFA  . PERIPHERAL VASCULAR INTERVENTION  06/05/2016   Procedure: Peripheral Vascular Intervention;  Surgeon: Lorretta Harp, MD;  Location: [facility];  Service: Cardiovascular;;  left SFA       Family History  Problem Relation  Age of Onset  . High blood pressure Mother   . Lupus Mother     Social History   Tobacco Use  . Smoking status: Current Every Day Smoker    Packs/day: 0.25    Years: 42.00    Pack years: 10.50    Types: Cigarettes  . Smokeless tobacco: Never Used  Substance Use Topics  . Alcohol use: Yes    Alcohol/week: 2.0 standard drinks    Types: 2 Cans  of beer per week  . Drug use: No    Home Medications Prior to Admission medications   Medication Sig Start Date End Date Taking? Authorizing Provider  albuterol (PROVENTIL) (2.5 MG/3ML) 0.083% nebulizer solution Take 3 mLs (2.5 mg total) by nebulization every 6 (six) hours as needed for wheezing or shortness of breath. 07/09/18   Raylene Everts, MD  albuterol (VENTOLIN HFA) 108 (90 Base) MCG/ACT inhaler INHALE 2 PUFFS INTO THE LUNGS EVERY 4 HOURS AS NEEDED FOR WHEEZING OR SHORTNESS OF BREATH 08/26/18   Sherene Sires, DO  bictegravir-emtricitabine-tenofovir AF (BIKTARVY) 50-200-25 MG TABS tablet Take 1 tablet by mouth daily. 03/17/19   Kuppelweiser, [name] L, RPH-CPP  carvedilol (COREG) 12.5 MG tablet Take 12.5 mg by mouth 2 (two) times daily with a meal.    [provider]  losartan (COZAAR) 25 MG tablet Take 1 tablet (25 mg total) by mouth at bedtime. 07/11/18   Nuala Alpha, DO  mometasone-formoterol (DULERA) 100-5 MCG/ACT AERO Inhale 2 puffs into the lungs every morning. 12/09/18   Bonnita Hollow, MD  potassium chloride (K-DUR) 10 MEQ tablet Take 2 tablets (20 mEq total) by mouth daily. 09/18/17   Sherene Sires, DO    Allergies    Patient has no known allergies.  Review of Systems   Review of Systems  Constitutional: Positive for diaphoresis. Negative for fever.  HENT: Negative for congestion.   Eyes: Negative for visual disturbance.  Respiratory: Positive for shortness of breath. Negative for cough and sputum production.   Cardiovascular: Negative for chest pain.  Gastrointestinal: Positive for nausea. Negative for vomiting.  Genitourinary: Negative for difficulty urinating.  Musculoskeletal: Positive for arthralgias.  Neurological: Positive for light-headedness.  Psychiatric/Behavioral: Negative for agitation.  All other systems reviewed and are negative.   Physical Exam Updated Vital Signs BP (!) 152/115   Pulse 67   Temp 98.2 F (36.8 C) (Oral)   Resp 17    Ht 6' (1.829 m)   Wt 69.4 kg   SpO2 100%   BMI 20.75 kg/m   Physical Exam Vitals and nursing note reviewed.  Constitutional:      General: He is not in acute distress.    Appearance: He is normal weight.  HENT:     Head: Normocephalic and atraumatic.     Nose: Nose normal.  Eyes:     Conjunctiva/sclera: Conjunctivae normal.     Pupils: Pupils are equal, round, and reactive to light.  Cardiovascular:     Rate and Rhythm: Normal rate and regular rhythm.     Pulses: Normal pulses.     Heart sounds: Normal heart sounds.  Pulmonary:     Effort: Pulmonary effort is normal. No respiratory distress.     Breath sounds: Normal breath sounds. No wheezing or rales.  Abdominal:     General: Abdomen is flat. Bowel sounds are normal.     Tenderness: There is no abdominal tenderness. There is no guarding.  Musculoskeletal:        General: Normal range  of motion.     Cervical back: Normal range of motion and neck supple.     Right lower leg: No edema.     Left lower leg: No edema.  Skin:    General: Skin is warm and dry.     Capillary Refill: Capillary refill takes less than 2 seconds.  Neurological:     General: No focal deficit present.     Mental Status: He is alert and oriented to person, place, and time.  Psychiatric:        Mood and Affect: Mood normal.        Behavior: Behavior normal.     ED Results / Procedures / Treatments   Labs (all labs ordered are listed, but only abnormal results are displayed) Results for orders placed or performed during the hospital encounter of 04/04/19  Respiratory Panel by RT PCR (Flu A&B, Covid) - Nasopharyngeal Swab   Specimen: Nasopharyngeal Swab  Result Value Ref Range   SARS Coronavirus 2 by RT PCR NEGATIVE NEGATIVE   Influenza A by PCR NEGATIVE NEGATIVE   Influenza B by PCR NEGATIVE NEGATIVE  Basic metabolic panel  Result Value Ref Range   Sodium 137 135 - 145 mmol/L   Potassium 3.7 3.5 - 5.1 mmol/L   Chloride 102 98 - 111 mmol/L    CO2 25 22 - 32 mmol/L   Glucose, Bld 103 (H) 70 - 99 mg/dL   BUN 26 (H) 8 - 23 mg/dL   Creatinine, Ser 2.07 (H) 0.61 - 1.24 mg/dL   Calcium 9.0 8.9 - 10.3 mg/dL   GFR calc non Af Amer 34 (L) >60 mL/min   GFR calc Af Amer 39 (L) >60 mL/min   Anion gap 10 5 - 15  CBC  Result Value Ref Range   WBC 8.0 4.0 - 10.5 K/uL   RBC 3.78 (L) 4.22 - 5.81 MIL/uL   Hemoglobin 11.8 (L) 13.0 - 17.0 g/dL   HCT 34.3 (L) 39.0 - 52.0 %   MCV 90.7 80.0 - 100.0 fL   MCH 31.2 26.0 - 34.0 pg   MCHC 34.4 30.0 - 36.0 g/dL   RDW 13.1 11.5 - 15.5 %   Platelets 294 150 - 400 K/uL   nRBC 0.0 0.0 - 0.2 %  Urinalysis, Routine w reflex microscopic  Result Value Ref Range   Color, Urine YELLOW YELLOW   APPearance CLEAR CLEAR   Specific Gravity, Urine 1.011 1.005 - 1.030   pH 5.0 5.0 - 8.0   Glucose, UA NEGATIVE NEGATIVE mg/dL   Hgb urine dipstick NEGATIVE NEGATIVE   Bilirubin Urine NEGATIVE NEGATIVE   Ketones, ur NEGATIVE NEGATIVE mg/dL   Protein, ur NEGATIVE NEGATIVE mg/dL   Nitrite NEGATIVE NEGATIVE   Leukocytes,Ua NEGATIVE NEGATIVE  I-stat chem 8, ED (not at [facility] or [facility])  Result Value Ref Range   Sodium 141 135 - 145 mmol/L   Potassium 3.9 3.5 - 5.1 mmol/L   Chloride 105 98 - 111 mmol/L   BUN 32 (H) 8 - 23 mg/dL   Creatinine, Ser 2.20 (H) 0.61 - 1.24 mg/dL   Glucose, Bld 97 70 - 99 mg/dL   Calcium, Ion 1.18 1.15 - 1.40 mmol/L   TCO2 32 22 - 32 mmol/L   Hemoglobin 12.2 (L) 13.0 - 17.0 g/dL   HCT 36.0 (L) 39.0 - 52.0 %   DG Chest Port 1 View  Result Date: 04/04/2019 CLINICAL DATA:  Awoke with shortness of breath. EXAM: PORTABLE CHEST 1 VIEW COMPARISON:  07/09/2018 FINDINGS: Low lung volumes.The cardiomediastinal contours are normal. Upper normal heart size likely accentuated by technique pulmonary vasculature is normal. No consolidation, pleural effusion, or pneumothorax. No acute osseous abnormalities are seen. IMPRESSION: Low lung volumes without acute findings. Electronically Signed   By: Keith Rake M.D.   On: 04/04/2019 02:08    EKG EKG Interpretation  Date/Time:  Friday April 04 2019 01:32:53 EST Ventricular Rate:  61 PR Interval:  216 QRS Duration: 84 QT Interval:  396 QTC Calculation: 398 R Axis:   61 Text Interpretation: Sinus rhythm with 1st degree A-V block Minimal voltage criteria for LVH, may be normal variant ( Sokolow-Lyon ) Septal infarct , age undetermined ST & Marked T-wave abnormality, consider inferolateral ischemia Confirmed by Randal Buba, Inaki Vantine ([id number]) on 04/04/2019 1:44:16 AM   Radiology DG Chest Port 1 View  Result Date: 04/04/2019 CLINICAL DATA:  Awoke with shortness of breath. EXAM: PORTABLE CHEST 1 VIEW COMPARISON:  07/09/2018 FINDINGS: Low lung volumes.The cardiomediastinal contours are normal. Upper normal heart size likely accentuated by technique pulmonary vasculature is normal. No consolidation, pleural effusion, or pneumothorax. No acute osseous abnormalities are seen. IMPRESSION: Low lung volumes without acute findings. Electronically Signed   By: Keith Rake M.D.   On: 04/04/2019 02:08    Procedures Procedures (including critical care time)  Medications Ordered in ED Medications  sodium chloride flush (NS) 0.9 % injection 3 mL (has no administration in time range)  heparin ADULT infusion 100 units/mL (25000 units/294mL sodium chloride 0.45%) (has no administration in time range)  heparin 5000 UNIT/ML injection (has no administration in time range)  aspirin chewable tablet 324 mg (324 mg Oral Given 04/04/19 0206)  ondansetron (ZOFRAN) injection 4 mg (4 mg Intravenous Given 04/04/19 0206)  heparin bolus via infusion 4,000 Units (4,000 Units Intravenous Bolus from Bag 04/04/19 0211)   MDM Reviewed previous: labs and ECG Interpretation: labs, ECG and x-ray (Hyperinflation on CXR by me normal potassium and white count by me) Total time providing critical care: 30-74 minutes (heparin drip for ACS). This excludes time spent performing  separately reportable procedures and services. Consults: cardiology   ED Course  I have reviewed the triage vital signs and the nursing notes.  Pertinent labs & imaging results that were available during my care of the patient were reviewed by me and considered in my medical decision making (see chart for details).   214 am case d/w Dr. Ellyn Hack who is reviewing EKGs on patient.. Cancel the stemi.    Patient is doing well.  Plan admit for ACS.   Final Clinical Impression(s) / ED Diagnoses Final diagnoses:  SOB (shortness of breath)      Zoiee Wimmer, MD 04/04/19 [id number]

## 2019-04-04 NOTE — ED Notes (Signed)
To [facility]

## 2019-04-04 NOTE — Progress Notes (Signed)
  Echocardiogram 2D Echocardiogram has been performed.  Kevin [name] 04/04/2019, 3:17 PM

## 2019-04-04 NOTE — ED Triage Notes (Signed)
Pt says that he woke up and could not catch his breath, says he then got up and "started seeing black spots", and became sweaty. SOB somewhat improved now, denies any pain or fevers.

## 2019-04-04 NOTE — H&P (Signed)
Cardiology Admission History and Physical:   Patient ID: Kevin [name] MRN: [id number]; DOB: [birth year]   Admission date: 04/04/2019  Primary Care Provider: Sherene Sires, DO Primary Cardiologist: Dr. Debara Pickett Primary Electrophysiologist:  None   Chief Complaint:  Shortness of breath  Patient Profile:   Kevin [name] is a 64 y.o. male with HIV, COPD, CKD (Cr ~ 2), chronic diastolic heart failure, HTN, and PAD s/p SFA intervention, who present after being woken from sleep with shortness of breath and diaphoresis.   History of Present Illness:   Kevin [name] was last followed by our group in 2018. At that time, he was experiencing persistent symptoms of SOB for which coronary angiography was performed. Coronaries were normal and LVEDP was mildly elevated. He followed up with cardiology shortly thereafter, but was subsequently lost to follow up.   He returns today after developing SOB trying to walk to the bathroom after waking up from sleep. He describes a sensation of not being able to get enough air, which caused him to fall to knees. He noted associated black spots in his vision. He was able to crawl to the couch where he broke out into a cold sweat. At that point his breathing improved, but he presented to the ER for further evaluation. He denied associated CP or palpitations. States that this episode was different from his prior similar episodes.   Of note, he describes a more chronic syndrome of dyspnea that has been present for the last 1-2 years. He is unable work outside with his young daughter and cannot walk long distances or run without feeling SOB. 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   Upon arrival to [facility], BP 155/85, HR 68, SpO2 100% on RA. Labs with Cr 2.2 (from ~ 1.9). UA unremarkable. CXR clear with no cardiomegaly or effusion. hsTn XX. EKG with LVH pattern similar to prior. Cardiology consulted given history of diastolic HF and  SOB.   Past Medical History:  Diagnosis Date  . AIDS (acquired immune deficiency syndrome) ([place]) 08/17/2016  . Chronic diastolic CHF (congestive heart failure), NYHA class 3 ([place]) 01/2016  . Chronic lower back pain   . CKD (chronic kidney disease), stage III   . COPD (chronic obstructive pulmonary disease) ([place])   . Gout    "forearms, hands, ankles, feet" (06/05/2016)  . Headache    "weekly" (06/05/2016)  . Heart murmur   . Hypertension   . Hypertensive crisis 08/15/2017  . OSA on CPAP   . PAD (peripheral artery disease) ([place])   . PAF (paroxysmal atrial fibrillation) ([place]) 01/2016    Past Surgical History:  Procedure Laterality Date  . LEFT HEART CATH AND CORONARY ANGIOGRAPHY N/A 10/23/2016   Procedure: LEFT HEART CATH AND CORONARY ANGIOGRAPHY;  Surgeon: Leonie Man, MD;  Location: [facility];  Service: Cardiovascular;  Laterality: N/A;  . LOWER EXTREMITY ANGIOGRAPHY N/A 07/17/2016   Procedure: Lower Extremity Angiography;  Surgeon: Lorretta Harp, MD;  Location: [facility];  Service: Cardiovascular;  Laterality: N/A;  . LOWER EXTREMITY INTERVENTION N/A 06/05/2016   Procedure: Lower Extremity Intervention;  Surgeon: Lorretta Harp, MD;  Location: [facility];  Service: Cardiovascular;  Laterality: N/A;  . PERIPHERAL VASCULAR ATHERECTOMY Right 07/17/2016   Procedure: Peripheral Vascular Atherectomy;  Surgeon: Lorretta Harp, MD;  Location: [facility];  Service: Cardiovascular;  Laterality: Right;  SFA  . PERIPHERAL VASCULAR INTERVENTION  06/05/2016  Procedure: Peripheral Vascular Intervention;  Surgeon: Lorretta Harp, MD;  Location: [facility];  Service: Cardiovascular;;  left SFA     Medications Prior to Admission: Prior to Admission medications   Medication Sig Start Date End Date Taking? Authorizing Provider  albuterol (PROVENTIL) (2.5 MG/3ML) 0.083% nebulizer solution Take 3 mLs (2.5 mg total) by nebulization every 6 (six) hours as  needed for wheezing or shortness of breath. 07/09/18   Raylene Everts, MD  albuterol (VENTOLIN HFA) 108 (90 Base) MCG/ACT inhaler INHALE 2 PUFFS INTO THE LUNGS EVERY 4 HOURS AS NEEDED FOR WHEEZING OR SHORTNESS OF BREATH 08/26/18   Sherene Sires, DO  bictegravir-emtricitabine-tenofovir AF (BIKTARVY) 50-200-25 MG TABS tablet Take 1 tablet by mouth daily. 03/17/19   Kuppelweiser, [name] L, RPH-CPP  carvedilol (COREG) 12.5 MG tablet Take 12.5 mg by mouth 2 (two) times daily with a meal.    [provider]  losartan (COZAAR) 25 MG tablet Take 1 tablet (25 mg total) by mouth at bedtime. 07/11/18   Nuala Alpha, DO  mometasone-formoterol (DULERA) 100-5 MCG/ACT AERO Inhale 2 puffs into the lungs every morning. 12/09/18   Bonnita Hollow, MD  potassium chloride (K-DUR) 10 MEQ tablet Take 2 tablets (20 mEq total) by mouth daily. 09/18/17   Sherene Sires, DO     Allergies:   No Known Allergies  Social History:   Social History   Socioeconomic History  . Marital status: Divorced    Spouse name: Not on file  . Number of children: Not on file  . Years of education: Not on file  . Highest education level: Not on file  Occupational History  . Not on file  Tobacco Use  . Smoking status: Current Every Day Smoker    Packs/day: 0.25    Years: 42.00    Pack years: 10.50    Types: Cigarettes  . Smokeless tobacco: Never Used  Substance and Sexual Activity  . Alcohol use: Yes    Alcohol/week: 2.0 standard drinks    Types: 2 Cans of beer per week  . Drug use: No  . Sexual activity: Not Currently  Other Topics Concern  . Not on file  Social History Narrative  . Not on file   Social Determinants of Health   Financial Resource Strain:   . Difficulty of Paying Living Expenses: Not on file  Food Insecurity:   . Worried About Charity fundraiser in the Last Year: Not on file  . Ran Out of Food in the Last Year: Not on file  Transportation Needs:   . Lack of Transportation (Medical): Not on  file  . Lack of Transportation (Non-Medical): Not on file  Physical Activity:   . Days of Exercise per Week: Not on file  . Minutes of Exercise per Session: Not on file  Stress:   . Feeling of Stress : Not on file  Social Connections:   . Frequency of Communication with Friends and Family: Not on file  . Frequency of Social Gatherings with Friends and Family: Not on file  . Attends Religious Services: Not on file  . Active Member of Clubs or Organizations: Not on file  . Attends Archivist Meetings: Not on file  . Marital Status: Not on file  Intimate Partner Violence:   . Fear of Current or Ex-Partner: Not on file  . Emotionally Abused: Not on file  . Physically Abused: Not on file  . Sexually Abused: Not on file  Family History:  The patient's family history includes High blood pressure in his mother; Lupus in his mother.    ROS:  Please see the history of present illness.  All other ROS reviewed and negative.     Physical Exam/Data:   Vitals:   04/04/19 0137 04/04/19 0200 04/04/19 0209 04/04/19 0215  BP: (!) 155/85 (!) 152/115  (!) 170/86  Pulse: 68 66 67 60  Resp: 16 16 17 12   Temp: 98.2 F (36.8 C)     TempSrc: Oral     SpO2: 100% 99% 100% 99%  Weight:  69.4 kg    Height:  6' (1.829 m)     No intake or output data in the 24 hours ending 04/04/19 0255 Last 3 Weights 04/04/2019 12/09/2018 09/18/2017  Weight (lbs) 153 lb 153 lb 161 lb  Weight (kg) 69.4 kg 69.4 kg 73.029 kg     Body mass index is 20.75 kg/m.  General:  Well nourished, well developed, in no acute distress HEENT: normal Neck: no JVD Vascular: No carotid bruits; FA pulses 2+ bilaterally without bruits  Cardiac:  normal S1, S2; RRR; no murmur Lungs:  Decreased air movement bilaterally. No rales.  Abd: soft, nontender, no hepatomegaly. Thin.  Ext: no edema Musculoskeletal:  No deformities, BUE and BLE strength normal and equal Skin: warm and dry  Psych:  Normal affect   EKG:  The ECG  that was done was personally reviewed and demonstrates prominent ST segments and early repol in anteroseptal leads with deep TWI inferiorly - consistent with LVH and similar to prior.   Relevant CV Studies: Cath 10/23/2016 Conclusion     Angiographically normal coronary arteries. Left dominant system  Normal LV function with mildly elevated LVEDP  The left ventricular ejection fraction is 55-65% by visual estimate.  With normal LV function, LVEDP and normal coronaries, would recommend evaluating for noncardiac etiology for dyspnea.  Plan: Return to nursing unit for TR band removal and at least 4 hours of IV hydration given his renal insufficiency.    TTE 2017: - Left ventricle: The cavity size was normal. There was moderate  concentric hypertrophy. Systolic function was vigorous. The  estimated ejection fraction was in the range of 65% to 70%. Wall  motion was normal; there were no regional wall motion  abnormalities. Doppler parameters are consistent with abnormal  left ventricular relaxation (grade 1 diastolic dysfunction).  Doppler parameters are consistent with elevated ventricular  end-diastolic filling pressure.  - Aortic valve: Trileaflet; normal thickness leaflets. There was no  regurgitation.  - Aortic root: The aortic root was normal in size.  - Ascending aorta: The ascending aorta was normal in size.  - Mitral valve: Structurally normal valve. There was no  regurgitation.  - Left atrium: The atrium was normal in size.  - Right ventricle: The cavity size was normal. Wall thickness was  normal. Systolic function was normal.  - Right atrium: The atrium was normal in size.  - Tricuspid valve: There was no regurgitation.  - Pulmonary arteries: Systolic pressure could not be accurately  estimated.  - Inferior vena cava: The vessel was dilated. The respirophasic  diameter changes were blunted (< 50%), consistent with elevated  central venous  pressure.  - Pericardium, extracardiac: There was no pericardial effusion.   Laboratory Data:  High Sensitivity Troponin:  No results for input(s): TROPONINIHS in the last 720 hours.    Chemistry Recent Labs  Lab 04/04/19 0135 04/04/19 0210  NA 137 141  K  3.7 3.9  CL 102 105  CO2 25  --   GLUCOSE 103* 97  BUN 26* 32*  CREATININE 2.07* 2.20*  CALCIUM 9.0  --   GFRNONAA 34*  --   GFRAA 39*  --   ANIONGAP 10  --     No results for input(s): PROT, ALBUMIN, AST, ALT, ALKPHOS, BILITOT in the last 168 hours. Hematology Recent Labs  Lab 04/04/19 0135 04/04/19 0210  WBC 8.0  --   RBC 3.78*  --   HGB 11.8* 12.2*  HCT 34.3* 36.0*  MCV 90.7  --   MCH 31.2  --   MCHC 34.4  --   RDW 13.1  --   PLT 294  --    BNPNo results for input(s): BNP, PROBNP in the last 168 hours.  DDimer No results for input(s): DDIMER in the last 168 hours.   Radiology/Studies:  [facility] Chest Port 1 View  Result Date: 04/04/2019 CLINICAL DATA:  Awoke with shortness of breath. EXAM: PORTABLE CHEST 1 VIEW COMPARISON:  07/09/2018 FINDINGS: Low lung volumes.The cardiomediastinal contours are normal. Upper normal heart size likely accentuated by technique pulmonary vasculature is normal. No consolidation, pleural effusion, or pneumothorax. No acute osseous abnormalities are seen. IMPRESSION: Low lung volumes without acute findings. Electronically Signed   By: Keith Rake M.D.   On: 04/04/2019 02:08    Assessment and Plan:   Kevin [name] is a 64 year old gentleman with well controlled HIV, HTN, CKD, and diastolic heart failure. He presents after an episode of SOB, presyncope, and diaphoresis.   Acute on Chronic Dyspnea -- Given decreased air movement on exam, will rx'd albuterol nebs.  -- Continue home steroid inhaler.  -- EKG unchanged from prior but hsTn still pending -- Complete echo ordered -- Heparin started by ER physician; would d'c if first hsTn wnl as low suspicion for ACS -- if work up  unrevealing, consider outpatient follow up for ?CPET to better characterize dyspnea.   HIV -- Continue home byktarvy  HTN -- Coreg 12.5 BID and losartan 25mg  daily ordered.  AoCKD -- Cr 2.2 from 1.9 earlier this month.  -- Check UA -- Consider renal ultrasound if Cr does not normalize.   Severity of Illness: The appropriate patient status for this patient is OBSERVATION. Observation status is judged to be reasonable and necessary in order to provide the required intensity of service to ensure the patient's safety. The patient's presenting symptoms, physical exam findings, and initial radiographic and laboratory data in the context of their medical condition is felt to place them at decreased risk for further clinical deterioration. Furthermore, it is anticipated that the patient will be medically stable for discharge from the hospital within 2 midnights of admission. The following factors support the patient status of observation.   " The patient's presenting symptoms include shortness of breath. " The physical exam findings include decreased breath sounds.  " The initial radiographic and laboratory data are reassuing.      For questions or updates, please contact [place] Please consult [email or web address] for contact info under        Signed, Milus Banister, MD  04/04/2019 2:55 AM

## 2019-04-04 NOTE — Progress Notes (Signed)
   Pt seen by fellow early this am.  SOB improved, no CP, in SR Currently resting comfortably, feels almost back to normal. Lungs clear bilaterally, O2 sats 100% on room air.   Initial trop elevated, not repeated.  Pt is npo.  Will repeat trop and decide on further eval.   Lab Results  Component Value Date   WBC 8.0 04/04/2019   HGB 12.2 (L) 04/04/2019   HCT 36.0 (L) 04/04/2019   MCV 90.7 04/04/2019   PLT 294 04/04/2019   No results for input(s): INR in the last 72 hours.  Recent Labs  Lab 04/04/19 0135 04/04/19 0135 04/04/19 0210  NA 137   < > 141  K 3.7   < > 3.9  CL 102   < > 105  CO2 25  --   --   BUN 26*   < > 32*  CREATININE 2.07*   < > 2.20*  CALCIUM 9.0  --   --   GLUCOSE 103*   < > 97   < > = values in this interval not displayed.   High Sensitivity Troponin:   Recent Labs  Lab 04/04/19 0444  TROPONINIHS 70*      DG Chest Port 1 View  Result Date: 04/04/2019 CLINICAL DATA:  Awoke with shortness of breath. EXAM: PORTABLE CHEST 1 VIEW COMPARISON:  07/09/2018 FINDINGS: Low lung volumes.The cardiomediastinal contours are normal. Upper normal heart size likely accentuated by technique pulmonary vasculature is normal. No consolidation, pleural effusion, or pneumothorax. No acute osseous abnormalities are seen. IMPRESSION: Low lung volumes without acute findings. Electronically Signed   By: Keith Rake M.D.   On: 04/04/2019 02:08    [name], PA-C 04/04/2019 7:59 AM

## 2019-04-05 ENCOUNTER — Other Ambulatory Visit: Payer: Self-pay | Admitting: Cardiology

## 2019-04-05 DIAGNOSIS — E7800 Pure hypercholesterolemia, unspecified: Secondary | ICD-10-CM

## 2019-04-05 DIAGNOSIS — I5032 Chronic diastolic (congestive) heart failure: Secondary | ICD-10-CM

## 2019-04-05 DIAGNOSIS — N179 Acute kidney failure, unspecified: Secondary | ICD-10-CM

## 2019-04-05 DIAGNOSIS — I248 Other forms of acute ischemic heart disease: Principal | ICD-10-CM

## 2019-04-05 DIAGNOSIS — N184 Chronic kidney disease, stage 4 (severe): Secondary | ICD-10-CM

## 2019-04-05 DIAGNOSIS — I10 Essential (primary) hypertension: Secondary | ICD-10-CM

## 2019-04-05 DIAGNOSIS — I480 Paroxysmal atrial fibrillation: Secondary | ICD-10-CM

## 2019-04-05 LAB — BASIC METABOLIC PANEL
Anion gap: 7 (ref 5–15)
BUN: 30 mg/dL — ABNORMAL HIGH (ref 8–23)
CO2: 23 mmol/L (ref 22–32)
Calcium: 8.3 mg/dL — ABNORMAL LOW (ref 8.9–10.3)
Chloride: 109 mmol/L (ref 98–111)
Creatinine, Ser: 2.26 mg/dL — ABNORMAL HIGH (ref 0.61–1.24)
GFR calc Af Amer: 35 mL/min — ABNORMAL LOW (ref >60)
GFR calc non Af Amer: 30 mL/min — ABNORMAL LOW (ref >60)
Glucose, Bld: 101 mg/dL — ABNORMAL HIGH (ref 70–99)
Potassium: 3.8 mmol/L (ref 3.5–5.1)
Sodium: 139 mmol/L (ref 135–145)

## 2019-04-05 LAB — CBC
HCT: 31.1 % — ABNORMAL LOW (ref 39.0–52.0)
Hemoglobin: 10.4 g/dL — ABNORMAL LOW (ref 13.0–17.0)
MCH: 30.8 pg (ref 26.0–34.0)
MCHC: 33.4 g/dL (ref 30.0–36.0)
MCV: 92 fL (ref 80.0–100.0)
Platelets: 260 10*3/uL (ref 150–400)
RBC: 3.38 MIL/uL — ABNORMAL LOW (ref 4.22–5.81)
RDW: 13.1 % (ref 11.5–15.5)
WBC: 7.5 10*3/uL (ref 4.0–10.5)
nRBC: 0 % (ref 0.0–0.2)

## 2019-04-05 MED ORDER — HYDRALAZINE HCL 50 MG PO TABS: 50.0000 mg | ORAL_TABLET | Freq: Two times a day (BID) | ORAL | 0 refills | Status: DC

## 2019-04-05 MED ORDER — AMLODIPINE BESYLATE 5 MG PO TABS
5.0000 mg | ORAL_TABLET | Freq: Once | ORAL | Status: AC
  Administered 2019-04-05: 12:00:00 5 mg via ORAL
  Filled 2019-04-05: qty 1

## 2019-04-05 MED ORDER — CARVEDILOL 6.25 MG PO TABS: 6.2500 mg | ORAL_TABLET | Freq: Two times a day (BID) | ORAL | Status: DC

## 2019-04-05 MED ORDER — ROSUVASTATIN CALCIUM 20 MG PO TABS: 20.0000 mg | ORAL_TABLET | Freq: Every day | ORAL | Status: DC

## 2019-04-05 MED ORDER — AMLODIPINE BESYLATE 5 MG PO TABS: 5.0000 mg | ORAL_TABLET | Freq: Two times a day (BID) | ORAL | Status: DC

## 2019-04-05 MED ORDER — ASPIRIN EC 81 MG PO TBEC
81.0000 mg | DELAYED_RELEASE_TABLET | Freq: Every day | ORAL | Status: DC
  Administered 2019-04-05: 12:00:00 81 mg via ORAL
  Filled 2019-04-05: qty 1

## 2019-04-05 MED ORDER — HYDRALAZINE HCL 50 MG PO TABS
50.0000 mg | ORAL_TABLET | Freq: Two times a day (BID) | ORAL | Status: DC
  Administered 2019-04-05: 12:00:00 50 mg via ORAL
  Filled 2019-04-05: qty 1

## 2019-04-05 MED ORDER — ROSUVASTATIN CALCIUM 20 MG PO TABS: 20.0000 mg | ORAL_TABLET | Freq: Every day | ORAL | 0 refills | Status: DC

## 2019-04-05 MED ORDER — AMLODIPINE BESYLATE 10 MG PO TABS: 10.0000 mg | ORAL_TABLET | Freq: Every day | ORAL | Status: DC

## 2019-04-05 MED ORDER — ASPIRIN 81 MG PO TBEC: 81.0000 mg | DELAYED_RELEASE_TABLET | Freq: Every day | ORAL | 0 refills | Status: DC

## 2019-04-05 MED ORDER — CARVEDILOL 6.25 MG PO TABS: 6.2500 mg | ORAL_TABLET | Freq: Two times a day (BID) | ORAL | 0 refills | Status: DC

## 2019-04-05 NOTE — Progress Notes (Signed)
Progress Note  Patient Name: Kevin [name] Date of Encounter: 04/05/2019  Primary Cardiologist: [name], MD   Subjective   Feeling well.  No chest pain.  Short of breath when he tries to lift heavy objects or when he gets hot.   Inpatient Medications    Scheduled Meds: . amLODipine  5 mg Oral Daily  . bictegravir-emtricitabine-tenofovir AF  1 tablet Oral Daily  . carvedilol  12.5 mg Oral BID WC  . mometasone-formoterol  2 puff Inhalation q morning - 10a  . sodium chloride flush  3 mL Intravenous Once   Continuous Infusions: . heparin 950 Units/hr (04/05/19 0422)   PRN Meds: albuterol   Vital Signs    Vitals:   04/04/19 1526 04/04/19 1800 04/04/19 2020 04/05/19 0347  BP: (!) 161/84  137/81 (!) 155/93  Pulse: 63  (!) 55 (!) 57  Resp:  16 20 18   Temp: 98.1 F (36.7 C)  98.1 F (36.7 C) 97.9 F (36.6 C)  TempSrc: Oral  Oral Oral  SpO2: 100%  100% 100%  Weight:    74.4 kg  Height:        Intake/Output Summary (Last 24 hours) at 04/05/2019 1107 Last data filed at 04/05/2019 0954 Gross per 24 hour  Intake 840 ml  Output 1350 ml  Net -510 ml   Last 3 Weights 04/05/2019 04/04/2019 12/09/2018  Weight (lbs) 164 lb 0.4 oz 153 lb 153 lb  Weight (kg) 74.4 kg 69.4 kg 69.4 kg      Telemetry    Sinus rhythm, sinus bradycardia.  - Personally Reviewed  ECG    04/04/19:   Sinus rhythm.  Rate 61 bpm.  LVH with repolarization abnormality.   - Personally Reviewed  Physical Exam   VS:  BP (!) 155/93 (BP Location: Left Arm)   Pulse (!) 57   Temp 97.9 F (36.6 C) (Oral)   Resp 18   Ht 6' (1.829 m)   Wt 74.4 kg   SpO2 100%   BMI 22.25 kg/m  , BMI Body mass index is 22.25 kg/m. GENERAL:  Well appearing.  No acute distress HEENT: Pupils equal round and reactive, fundi not visualized, oral mucosa unremarkable NECK:  No jugular venous distention, waveform within normal limits, carotid upstroke brisk and symmetric, no bruits LUNGS:  Clear to auscultation  bilaterally HEART:  RRR.  PMI not displaced or sustained,S1 and S2 within normal limits, no S3, no S4, no clicks, no rubs, no murmurs ABD:  Flat, positive bowel sounds normal in frequency in pitch, no bruits, no rebound, no guarding, no midline pulsatile mass, no hepatomegaly, no splenomegaly EXT:  2 plus pulses throughout, no edema, no cyanosis no clubbing SKIN:  No rashes no nodules NEURO:  Cranial nerves II through XII grossly intact, motor grossly intact throughout PSYCH:  Cognitively intact, oriented to person place and time   Labs    High Sensitivity Troponin:   Recent Labs  Lab 04/04/19 0444 04/04/19 0856 04/04/19 1101  TROPONINIHS 70* 64* 67*      Chemistry Recent Labs  Lab 04/04/19 0135 04/04/19 0210 04/05/19 0420  NA 137 141 139  K 3.7 3.9 3.8  CL 102 105 109  CO2 25  --  23  GLUCOSE 103* 97 101*  BUN 26* 32* 30*  CREATININE 2.07* 2.20* 2.26*  CALCIUM 9.0  --  8.3*  GFRNONAA 34*  --  30*  GFRAA 39*  --  35*  ANIONGAP 10  --  7  Hematology Recent Labs  Lab 04/04/19 0135 04/04/19 0210 04/05/19 0420  WBC 8.0  --  7.5  RBC 3.78*  --  3.38*  HGB 11.8* 12.2* 10.4*  HCT 34.3* 36.0* 31.1*  MCV 90.7  --  92.0  MCH 31.2  --  30.8  MCHC 34.4  --  33.4  RDW 13.1  --  13.1  PLT 294  --  260    BNPNo results for input(s): BNP, PROBNP in the last 168 hours.   DDimer No results for input(s): DDIMER in the last 168 hours.   Radiology    NM Myocar Multi W/Spect W/Wall Motion / EF  Result Date: 04/04/2019 CLINICAL DATA:  Chest pain EXAM: MYOCARDIAL IMAGING WITH SPECT (REST AND PHARMACOLOGIC-STRESS) GATED LEFT VENTRICULAR WALL MOTION STUDY LEFT VENTRICULAR EJECTION FRACTION TECHNIQUE: Standard myocardial SPECT imaging was performed after resting intravenous injection of 10 mCi Tc-37m tetrofosmin. Subsequently, intravenous infusion of Lexiscan was performed under the supervision of the Cardiology staff. At peak effect of the drug, 30 mCi Tc-59m tetrofosmin was  injected intravenously and standard myocardial SPECT imaging was performed. Quantitative gated imaging was also performed to evaluate left ventricular wall motion, and estimate left ventricular ejection fraction. COMPARISON:  None. FINDINGS: Perfusion: Large fixed inferior wall defect extending to involve the apex. Apical component of the defect is slightly larger on the stress imaging, difficult to completely exclude a small amount of apical peri-infarct ischemia with pharmacologic stress. Wall Motion: Mild LV chamber dilatation.  Septal hypokinesis. Left Ventricular Ejection Fraction: 50 % End diastolic volume 712 ml End systolic volume 458 ml IMPRESSION: 1. Large fixed inferior wall defect extending to the apex. Apical component appears larger on stress imaging suspicious for small amount of apical peri-infarct inducible ischemia. 2. Septal hypokinesis. 3. Left ventricular ejection fraction 50% 4. Non invasive risk stratification*: Intermediate *2012 Appropriate Use Criteria for Coronary Revascularization Focused Update: J Am Coll Cardiol. 0998;33(8):250-539. http://content.[email or web address].aspx?articleid=[email or web address] Electronically Signed   By: Jerilynn Mages.  Shick M.D.   On: 04/04/2019 14:31   DG Chest Port 1 View  Result Date: 04/04/2019 CLINICAL DATA:  Awoke with shortness of breath. EXAM: PORTABLE CHEST 1 VIEW COMPARISON:  07/09/2018 FINDINGS: Low lung volumes.The cardiomediastinal contours are normal. Upper normal heart size likely accentuated by technique pulmonary vasculature is normal. No consolidation, pleural effusion, or pneumothorax. No acute osseous abnormalities are seen. IMPRESSION: Low lung volumes without acute findings. Electronically Signed   By: Keith Rake M.D.   On: 04/04/2019 02:08   ECHOCARDIOGRAM COMPLETE  Result Date: 04/04/2019    ECHOCARDIOGRAM REPORT   Patient Name:   Kevin [name] Date of Exam: 04/04/2019 Medical Rec #:  [id number]       Height:       72.0 in Accession #:     [id number]      Weight:       153.0 lb Date of Birth:  April 18, 1955       BSA:          1.90 m Patient Age:    64 years        BP:           133/81 mmHg Patient Gender: M               HR:           52 bpm. Exam Location:  Inpatient Procedure: 2D Echo, Cardiac Doppler and Color Doppler Indications:    [id number]/[id number]  History:        Patient has prior  history of Echocardiogram examinations, most                 recent 02/09/2016. CHF, CAD, COPD, Arrythmias:Atrial                 Fibrillation, Signs/Symptoms:Dyspnea and Shortness of Breath;                 Risk Factors:Current Smoker and Hypertension. HIV. CKD.  Sonographer:    Clayton Lefort RDCS (AE) Referring Phys: [id number] [place]  1. Left ventricular ejection fraction, by estimation, is 55 to 60%. The left ventricle has normal function. The left ventricle has no regional wall motion abnormalities. There is mild concentric left ventricular hypertrophy. Left ventricular diastolic parameters are consistent with Grade I diastolic dysfunction (impaired relaxation).  2. Right ventricular systolic function is normal. The right ventricular size is normal. Tricuspid regurgitation signal is inadequate for assessing PA pressure.  3. The mitral valve is grossly normal. No evidence of mitral valve regurgitation.  4. The aortic valve is tricuspid. Aortic valve regurgitation is not visualized. No aortic stenosis is present.  5. The inferior vena cava is normal in size with greater than 50% respiratory variability, suggesting right atrial pressure of 3 mmHg. Comparison(s): A prior study was performed on 02/09/2016. No significant change from prior study. FINDINGS  Left Ventricle: Left ventricular ejection fraction, by estimation, is 55 to 60%. The left ventricle has normal function. The left ventricle has no regional wall motion abnormalities. The left ventricular internal cavity size was normal in size. There is  mild concentric left ventricular hypertrophy.  Left ventricular diastolic parameters are consistent with Grade I diastolic dysfunction (impaired relaxation). Normal left ventricular filling pressure. Right Ventricle: The right ventricular size is normal. No increase in right ventricular wall thickness. Right ventricular systolic function is normal. Tricuspid regurgitation signal is inadequate for assessing PA pressure. Left Atrium: Left atrial size was normal in size. Right Atrium: Right atrial size was normal in size. Pericardium: There is no evidence of pericardial effusion. Mitral Valve: The mitral valve is grossly normal. No evidence of mitral valve regurgitation. MV peak gradient, 3.1 mmHg. The mean mitral valve gradient is 1.0 mmHg. Tricuspid Valve: The tricuspid valve is grossly normal. Tricuspid valve regurgitation is not demonstrated. Aortic Valve: The aortic valve is tricuspid. Aortic valve regurgitation is not visualized. No aortic stenosis is present. Aortic valve mean gradient measures 5.0 mmHg. Aortic valve peak gradient measures 9.5 mmHg. Aortic valve area, by VTI measures 2.53 cm. Pulmonic Valve: The pulmonic valve was grossly normal. Pulmonic valve regurgitation is not visualized. Aorta: The aortic root is normal in size and structure. Venous: The inferior vena cava is normal in size with greater than 50% respiratory variability, suggesting right atrial pressure of 3 mmHg. IAS/Shunts: No atrial level shunt detected by color flow Doppler.  LEFT VENTRICLE PLAX 2D LVIDd:         4.40 cm  Diastology LVIDs:         2.60 cm  LV e' lateral:   7.51 cm/s LV PW:         1.20 cm  LV E/e' lateral: 9.4 LV IVS:        1.40 cm  LV e' medial:    6.85 cm/s LVOT diam:     2.00 cm  LV E/e' medial:  10.3 LV SV:         70.06 ml LV SV Index:   33.72 LVOT Area:     3.14 cm  RIGHT VENTRICLE             IVC RV Basal diam:  3.00 cm     IVC diam: 1.40 cm RV S prime:     16.10 cm/s TAPSE (M-mode): 2.4 cm LEFT ATRIUM             Index       RIGHT ATRIUM           Index  LA diam:        3.30 cm 1.74 cm/m  RA Area:     18.60 cm LA Vol (A2C):   67.5 ml 35.50 ml/m RA Volume:   49.50 ml  26.03 ml/m LA Vol (A4C):   49.9 ml 26.25 ml/m LA Biplane Vol: 57.8 ml 30.40 ml/m  AORTIC VALVE AV Area (Vmax):    2.34 cm AV Area (Vmean):   2.44 cm AV Area (VTI):     2.53 cm AV Vmax:           154.33 cm/s AV Vmean:          103.667 cm/s AV VTI:            0.277 m AV Peak Grad:      9.5 mmHg AV Mean Grad:      5.0 mmHg LVOT Vmax:         115.00 cm/s LVOT Vmean:        80.500 cm/s LVOT VTI:          0.223 m LVOT/AV VTI ratio: 0.81  AORTA Ao Root diam: 3.60 cm MITRAL VALVE MV Area (PHT): 2.11 cm    SHUNTS MV Peak grad:  3.1 mmHg    Systemic VTI:  0.22 m MV Mean grad:  1.0 mmHg    Systemic Diam: 2.00 cm MV Vmax:       0.88 m/s MV Vmean:      47.9 cm/s MV Decel Time: 359 msec MV E velocity: 70.70 cm/s MV A velocity: 81.00 cm/s MV E/A ratio:  0.87 Eleonore Chiquito MD Electronically signed by Eleonore Chiquito MD Signature Date/Time: 04/04/2019/4:14:20 PM    Final     Cardiac Studies   Echo 04/04/19: IMPRESSIONS   1. Left ventricular ejection fraction, by estimation, is 55 to 60%. The  left ventricle has normal function. The left ventricle has no regional  wall motion abnormalities. There is mild concentric left ventricular  hypertrophy. Left ventricular diastolic  parameters are consistent with Grade I diastolic dysfunction (impaired  relaxation).  2. Right ventricular systolic function is normal. The right ventricular  size is normal. Tricuspid regurgitation signal is inadequate for assessing  PA pressure.  3. The mitral valve is grossly normal. No evidence of mitral valve  regurgitation.  4. The aortic valve is tricuspid. Aortic valve regurgitation is not  visualized. No aortic stenosis is present.  5. The inferior vena cava is normal in size with greater than 50%  respiratory variability, suggesting right atrial pressure of 3 mmHg.   LHC 10/23/16:  Normal coronary arteries.   Mildly elevated LVEDP.  Lexiscan Myoview 04/04/19: IMPRESSION: 1. Large fixed inferior wall defect extending to the apex. Apical component appears larger on stress imaging suspicious for small amount of apical peri-infarct inducible ischemia.  2. Septal hypokinesis.  3. Left ventricular ejection fraction 50%  4. Non invasive risk stratification*: Intermediate  Patient Profile     Mr. Mcwherter is a 31M with COPD, chronic diastolic heart failure, OSA not on CPAP, hypertension, HIV on HAART, and PAD  here with acute onset of SOB that awoke him from sleep.    Assessment & Plan   # Demand ischemia: Hs troponin mildly elevated to 70.  Lexiscan Myoview with fixed inferior defect concerning for prior MI.  However he had a similar reading 03/2016 and subsequent cardiac catheterization showed normal coronary arteries.  Therefore, this is more likely due to diaphragmatic attenuation.  No plans for cardiac catheterization.  Stop heparin.  Resume rosuvastatin and carvedilol.   # Chronic diastolic heart failure: # Hypertension:  Euvolemic on exam.   BP remains elevated.  Home HCTZ/losartan held 2/2 AKI.  These medications were recently started as an outpatient.  Prior to this he reports that his blood pressure was poorly controlled for years.  This is likely the cause of his diastolic heart failure and CKD.  We will stop HCTZ, losartan.  Continue amlodipine.  He has been bradycardic so we will reduce carvedilol to 6.25 mg twice daily.  Add hydralazine 50 mg twice daily.  He already has follow-up with his primary care physician.  He will need a basic metabolic panel checked at that time.   # Acute on chronic renal failure:  Creatinine has worsened from 1.9 prior to admission to 2.26 today.  8 months ago it was 1.37.  As above he was started on HCTZ, losartan as an outpatient.  These medications have been discontinued.  Creatinine did not improve with IV fluid.  This is likely combination of chronic kidney  disease attributable to poorly controlled hypertension and the new addition of HCTZ, losartan.  We are switching to amlodipine, hydralazine, and carvedilol as above.  I have asked him to track his blood pressure at home and bring it to follow-up.  We will refer him to nephrology as an outpatient.  # OSA:  Not on CPAP at home due to not tolerating the machine.  We discussed the importance of treating sleep apnea and the fact that this is likely the cause of why he awoke from sleep suddenly feeling short of breath.  He understands that there are new types of machines and is willing to try again.  We will arrange for outpatient sleep study.  # HIV:  Stable.  Continue Biktarvy.  # PAD:  # Hyperlipidemia:  S/p SFA stent.  Increase rosuvastatin to 20mg  given that LDL was 93.   # COPD:  Continue inhalers.  # PAF:  Carvedilol added.  No recent PAF.  This patients CHA2DS2-VASc Score and unadjusted Ischemic Stroke Rate (% per year) is equal to 2.2 % stroke rate/year from a score of 2.  Will need to consider anticoagulation.  Continue aspirin for now.  Above score calculated as 1 point each if present [CHF, HTN, DM, Vascular=MI/PAD/Aortic Plaque, Age if 65-74, or Male] Above score calculated as 2 points each if present [Age > 75, or Stroke/TIA/TE]    For questions or updates, please contact [place] Please consult [email or web address] for contact info under        Signed, Skeet Latch, MD  04/05/2019, 11:07 AM

## 2019-04-05 NOTE — Discharge Summary (Signed)
Discharge Summary    Patient ID: Kevin [name],  MRN: [id number], DOB/AGE: September 03, 1955 64 y.o.  Admit date: 04/04/2019 Discharge date: 04/05/2019  Primary Care Provider: Sherene Sires Primary Cardiologist: [name], MD  Discharge Diagnoses    Principal Problem:   Dyspnea Active Problems:   PAF (paroxysmal atrial fibrillation) (HCC)   Essential hypertension   Acute on chronic diastolic CHF (congestive heart failure), NYHA class 3 (HCC)   OSA (obstructive sleep apnea)   Peripheral arterial disease (HCC)   Stage 3 chronic kidney disease   HIV disease ([place])   Allergies No Known Allergies  Diagnostic Studies/Procedures    Echo 04/04/19: IMPRESSIONS   1. Left ventricular ejection fraction, by estimation, is 55 to 60%. The  left ventricle has normal function. The left ventricle has no regional  wall motion abnormalities. There is mild concentric left ventricular  hypertrophy. Left ventricular diastolic  parameters are consistent with Grade I diastolic dysfunction (impaired  relaxation).  2. Right ventricular systolic function is normal. The right ventricular  size is normal. Tricuspid regurgitation signal is inadequate for assessing  PA pressure.  3. The mitral valve is grossly normal. No evidence of mitral valve  regurgitation.  4. The aortic valve is tricuspid. Aortic valve regurgitation is not  visualized. No aortic stenosis is present.  5. The inferior vena cava is normal in size with greater than 50%  respiratory variability, suggesting right atrial pressure of 3 mmHg.   Lexiscan Myoview 04/04/19: IMPRESSION: 1. Large fixed inferior wall defect extending to the apex. Apical component appears larger on stress imaging suspicious for small amount of apical peri-infarct inducible ischemia.  2. Septal hypokinesis.  3. Left ventricular ejection fraction 50%  4. Non invasive risk stratification*: Intermediate  _____________   History of Present  Illness     Kevin [name] is a 64 y.o. male with HIV, COPD, CKD (Cr ~ 2), chronic diastolic heart failure, HTN, and PAD s/p SFA intervention, who present after being woken from sleep with shortness of breath and diaphoresis. He was last followed by our group in 2018. At that time, he was experiencing persistent symptoms of SOB for which coronary angiography was performed. Coronaries were normal and LVEDP was mildly elevated. He followed up with cardiology shortly thereafter, but was subsequently lost to follow up.   He returned to the ED after developing SOB trying to walk to the bathroom after waking up from sleep. He describeed a sensation of not being able to get enough air, which caused him to fall to knees. He noted associated black spots in his vision. He was able to crawl to the couch where he broke out into a cold sweat. At that point his breathing improved, but he presented to the ER for further evaluation. He denied associated CP or palpitations. Stated that this episode was different from his prior similar episodes.   Of note, he described a more chronic syndrome of dyspnea that had been present for the last 1-2 years. He has been unable work outside with his young daughter and cannot walk long distances or run without feeling SOB. 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   Upon arrival to [facility], BP 155/85, HR 68, SpO2 100% on RA. Labs with Cr 2.2 (from ~ 1.9). UA unremarkable. CXR clear with no cardiomegaly or effusion. hsTn XX. EKG with LVH pattern similar to prior. Cardiology consulted given history of diastolic HF and SOB. He  was admitted for further management.  Hospital Course     Consultants: None  1. Demand ischemia: Hs troponin mildly elevated to 70. Initially placed on IV heparin. Lexiscan Myoview with fixed inferior defect concerning for prior MI.  However he had a similar reading 03/2016 and subsequent cardiac catheterization showed  normal coronary arteries.  Therefore, this was felt to be more likely due to diaphragmatic attenuation.  No plans for cardiac catheterization.  -- continue ASA, rosuvastatin and carvedilol.   2. Chronic diastolic heart failure/HTN: Euvolemic on exam at the time of discharge. BP remains elevated. Home HCTZ/losartan held 2/2 AKI.  These medications were recently started as an outpatient.  Prior to this he reported that his blood pressure was poorly controlled for years.  This was felt to likely be the cause of his diastolic heart failure and CKD.   -- plan to stop HCTZ, and losartan.  Continue amlodipine.  He has been bradycardic therefore his carvedilol was reduced to 6.25 mg twice daily.  Added hydralazine 50 mg twice daily.  He already has follow-up with his primary care physician.  He is instructed to have a basic metabolic panel checked at that time.   3. Acute on chronic renal failure:  Creatinine has worsened from 1.9 prior to admission to 2.26 on the day of discharge. 8 months ago it was 1.37.  As above he was started on HCTZ, losartan as an outpatient. These medications have been discontinued. His creatinine did not improve with IV fluid. --This is likely combination of chronic kidney disease attributable to poorly controlled hypertension and the new addition of HCTZ, losartan.   -- Switched to amlodipine, hydralazine, and carvedilol as above. He is asked to track his blood pressure at home and bring it to follow-up.  We will refer him to nephrology as an outpatient.  4. OSA:  Not on CPAP at home due to not tolerating the machine. Discussed the importance of treating sleep apnea and the fact that this is likely the cause of why he awoke from sleep suddenly feeling short of breath.  He understands that there are new types of machines and is willing to try again.   -- will need an outpatient sleep study  5. HIV: Stable.  Continue Biktarvy.  6. PAD/Hyperlipidemia:  S/p SFA stent.  Increased  rosuvastatin to 20mg  given that LDL was 93.  -- outpatient FLP/LFTs in 8 weeks  7. COPD: Continue inhalers.  8. PAF: Carvedilol added this admission.  No recent PAF.  This patients CHA2DS2-VASc Score and unadjusted Ischemic Stroke Rate (% per year) is equal to 2.2 % stroke rate/year from a score of 2.  Will need to consider anticoagulation. Per Dr. Oval Linsey will continue aspirin for now.   Kevin [name] was seen by Dr. Oval Linsey and determined stable for discharge home. Follow up in the office has been arranged. Medications are listed below.   _____________  Discharge Vitals Blood pressure (!) 155/93, pulse (!) 57, temperature 97.9 F (36.6 C), temperature source Oral, resp. rate 18, height 6' (1.829 m), weight 74.4 kg, SpO2 100 %.  Filed Weights   04/04/19 0200 04/05/19 0347  Weight: 69.4 kg 74.4 kg    Labs & Radiologic Studies    CBC Recent Labs    04/04/19 0135 04/04/19 0135 04/04/19 0210 04/05/19 0420  WBC 8.0  --   --  7.5  HGB 11.8*   < > 12.2* 10.4*  HCT 34.3*   < > 36.0* 31.1*  MCV 90.7  --   --  92.0  PLT 294  --   --  260   < > = values in this interval not displayed.   Basic Metabolic Panel Recent Labs    04/04/19 0135 04/04/19 0135 04/04/19 0210 04/05/19 0420  NA 137   < > 141 139  K 3.7   < > 3.9 3.8  CL 102   < > 105 109  CO2 25  --   --  23  GLUCOSE 103*   < > 97 101*  BUN 26*   < > 32* 30*  CREATININE 2.07*   < > 2.20* 2.26*  CALCIUM 9.0  --   --  8.3*   < > = values in this interval not displayed.   Liver Function Tests No results for input(s): AST, ALT, ALKPHOS, BILITOT, PROT, ALBUMIN in the last 72 hours. No results for input(s): LIPASE, AMYLASE in the last 72 hours. Cardiac Enzymes No results for input(s): CKTOTAL, CKMB, CKMBINDEX, TROPONINI in the last 72 hours. BNP Invalid input(s): POCBNP D-Dimer No results for input(s): DDIMER in the last 72 hours. Hemoglobin A1C Recent Labs    04/04/19 0458  HGBA1C 5.4   Fasting Lipid  Panel Recent Labs    04/04/19 0458  CHOL 177  HDL 43  LDLCALC 93  TRIG 206*  CHOLHDL 4.1   Thyroid Function Tests No results for input(s): TSH, T4TOTAL, T3FREE, THYROIDAB in the last 72 hours.  Invalid input(s): FREET3 _____________  NM Myocar Multi W/Spect W/Wall Motion / EF  Result Date: 04/04/2019 CLINICAL DATA:  Chest pain EXAM: MYOCARDIAL IMAGING WITH SPECT (REST AND PHARMACOLOGIC-STRESS) GATED LEFT VENTRICULAR WALL MOTION STUDY LEFT VENTRICULAR EJECTION FRACTION TECHNIQUE: Standard myocardial SPECT imaging was performed after resting intravenous injection of 10 mCi Tc-52m tetrofosmin. Subsequently, intravenous infusion of Lexiscan was performed under the supervision of the Cardiology staff. At peak effect of the drug, 30 mCi Tc-3m tetrofosmin was injected intravenously and standard myocardial SPECT imaging was performed. Quantitative gated imaging was also performed to evaluate left ventricular wall motion, and estimate left ventricular ejection fraction. COMPARISON:  None. FINDINGS: Perfusion: Large fixed inferior wall defect extending to involve the apex. Apical component of the defect is slightly larger on the stress imaging, difficult to completely exclude a small amount of apical peri-infarct ischemia with pharmacologic stress. Wall Motion: Mild LV chamber dilatation.  Septal hypokinesis. Left Ventricular Ejection Fraction: 50 % End diastolic volume 734 ml End systolic volume 193 ml IMPRESSION: 1. Large fixed inferior wall defect extending to the apex. Apical component appears larger on stress imaging suspicious for small amount of apical peri-infarct inducible ischemia. 2. Septal hypokinesis. 3. Left ventricular ejection fraction 50% 4. Non invasive risk stratification*: Intermediate *2012 Appropriate Use Criteria for Coronary Revascularization Focused Update: J Am Coll Cardiol. 7902;40(9):735-329. http://content.airportbarriers.com.aspx?articleid=[email or web address] Electronically Signed   By:  Jerilynn Mages.  Shick M.D.   On: 04/04/2019 14:31   DG Chest Port 1 View  Result Date: 04/04/2019 CLINICAL DATA:  Awoke with shortness of breath. EXAM: PORTABLE CHEST 1 VIEW COMPARISON:  07/09/2018 FINDINGS: Low lung volumes.The cardiomediastinal contours are normal. Upper normal heart size likely accentuated by technique pulmonary vasculature is normal. No consolidation, pleural effusion, or pneumothorax. No acute osseous abnormalities are seen. IMPRESSION: Low lung volumes without acute findings. Electronically Signed   By: Keith Rake M.D.   On: 04/04/2019 02:08   ECHOCARDIOGRAM COMPLETE  Result Date: 04/04/2019    ECHOCARDIOGRAM REPORT   Patient Name:   Kevin [name] Date of Exam: 04/04/2019 Medical Rec #:  [id number]       Height:       72.0 in Accession #:    [id number]      Weight:       153.0 lb Date of Birth:  09/14/1955       BSA:          1.90 m Patient Age:    84 years        BP:           133/81 mmHg Patient Gender: M               HR:           52 bpm. Exam Location:  Inpatient Procedure: 2D Echo, Cardiac Doppler and Color Doppler Indications:    [id number]/R06.00  History:        Patient has prior history of Echocardiogram examinations, most                 recent 02/09/2016. CHF, CAD, COPD, Arrythmias:Atrial                 Fibrillation, Signs/Symptoms:Dyspnea and Shortness of Breath;                 Risk Factors:Current Smoker and Hypertension. HIV. CKD.  Sonographer:    Clayton Lefort RDCS (AE) Referring Phys: [id number] [place]  1. Left ventricular ejection fraction, by estimation, is 55 to 60%. The left ventricle has normal function. The left ventricle has no regional wall motion abnormalities. There is mild concentric left ventricular hypertrophy. Left ventricular diastolic parameters are consistent with Grade I diastolic dysfunction (impaired relaxation).  2. Right ventricular systolic function is normal. The right ventricular size is normal. Tricuspid regurgitation signal is  inadequate for assessing PA pressure.  3. The mitral valve is grossly normal. No evidence of mitral valve regurgitation.  4. The aortic valve is tricuspid. Aortic valve regurgitation is not visualized. No aortic stenosis is present.  5. The inferior vena cava is normal in size with greater than 50% respiratory variability, suggesting right atrial pressure of 3 mmHg. Comparison(s): A prior study was performed on 02/09/2016. No significant change from prior study. FINDINGS  Left Ventricle: Left ventricular ejection fraction, by estimation, is 55 to 60%. The left ventricle has normal function. The left ventricle has no regional wall motion abnormalities. The left ventricular internal cavity size was normal in size. There is  mild concentric left ventricular hypertrophy. Left ventricular diastolic parameters are consistent with Grade I diastolic dysfunction (impaired relaxation). Normal left ventricular filling pressure. Right Ventricle: The right ventricular size is normal. No increase in right ventricular wall thickness. Right ventricular systolic function is normal. Tricuspid regurgitation signal is inadequate for assessing PA pressure. Left Atrium: Left atrial size was normal in size. Right Atrium: Right atrial size was normal in size. Pericardium: There is no evidence of pericardial effusion. Mitral Valve: The mitral valve is grossly normal. No evidence of mitral valve regurgitation. MV peak gradient, 3.1 mmHg. The mean mitral valve gradient is 1.0 mmHg. Tricuspid Valve: The tricuspid valve is grossly normal. Tricuspid valve regurgitation is not demonstrated. Aortic Valve: The aortic valve is tricuspid. Aortic valve regurgitation is not visualized. No aortic stenosis is present. Aortic valve mean gradient measures 5.0 mmHg. Aortic valve peak gradient measures 9.5 mmHg. Aortic valve area, by VTI measures 2.53 cm. Pulmonic Valve: The pulmonic valve was grossly normal. Pulmonic valve regurgitation is not visualized.  Aorta: The aortic root is normal in size and structure.  Venous: The inferior vena cava is normal in size with greater than 50% respiratory variability, suggesting right atrial pressure of 3 mmHg. IAS/Shunts: No atrial level shunt detected by color flow Doppler.  LEFT VENTRICLE PLAX 2D LVIDd:         4.40 cm  Diastology LVIDs:         2.60 cm  LV e' lateral:   7.51 cm/s LV PW:         1.20 cm  LV E/e' lateral: 9.4 LV IVS:        1.40 cm  LV e' medial:    6.85 cm/s LVOT diam:     2.00 cm  LV E/e' medial:  10.3 LV SV:         70.06 ml LV SV Index:   33.72 LVOT Area:     3.14 cm  RIGHT VENTRICLE             IVC RV Basal diam:  3.00 cm     IVC diam: 1.40 cm RV S prime:     16.10 cm/s TAPSE (M-mode): 2.4 cm LEFT ATRIUM             Index       RIGHT ATRIUM           Index LA diam:        3.30 cm 1.74 cm/m  RA Area:     18.60 cm LA Vol (A2C):   67.5 ml 35.50 ml/m RA Volume:   49.50 ml  26.03 ml/m LA Vol (A4C):   49.9 ml 26.25 ml/m LA Biplane Vol: 57.8 ml 30.40 ml/m  AORTIC VALVE AV Area (Vmax):    2.34 cm AV Area (Vmean):   2.44 cm AV Area (VTI):     2.53 cm AV Vmax:           154.33 cm/s AV Vmean:          103.667 cm/s AV VTI:            0.277 m AV Peak Grad:      9.5 mmHg AV Mean Grad:      5.0 mmHg LVOT Vmax:         115.00 cm/s LVOT Vmean:        80.500 cm/s LVOT VTI:          0.223 m LVOT/AV VTI ratio: 0.81  AORTA Ao Root diam: 3.60 cm MITRAL VALVE MV Area (PHT): 2.11 cm    SHUNTS MV Peak grad:  3.1 mmHg    Systemic VTI:  0.22 m MV Mean grad:  1.0 mmHg    Systemic Diam: 2.00 cm MV Vmax:       0.88 m/s MV Vmean:      47.9 cm/s MV Decel Time: 359 msec MV E velocity: 70.70 cm/s MV A velocity: 81.00 cm/s MV E/A ratio:  0.87 Eleonore Chiquito MD Electronically signed by Eleonore Chiquito MD Signature Date/Time: 04/04/2019/4:14:20 PM    Final    Disposition   Pt is being discharged home today in good condition.  Follow-up Plans & Appointments    Follow-up Information    Hilty, Nadean Corwin, MD Follow up.     Specialty: Cardiology Why: The office will call you to arrange for office follow up. If you have not received a call within 2 business days please call us to arrange. Contact information: [place] [address] [place], [place], DO Follow up  on 04/16/2019.   Specialty: Family Medicine Why: Please keep scheduled follow up appt. You will need labs (BMP) checked at this visit Contact information: [address] [place] [address] [phone]          Discharge Instructions    Ambulatory referral to Nephrology   Complete by: As directed    Diet - low sodium heart healthy   Complete by: As directed    Increase activity slowly   Complete by: As directed        Discharge Medications     Medication List    STOP taking these medications   losartan 25 MG tablet Commonly known as: COZAAR   losartan-hydrochlorothiazide 100-25 MG tablet Commonly known as: HYZAAR     TAKE these medications   albuterol (2.5 MG/3ML) 0.083% nebulizer solution Commonly known as: PROVENTIL Take 3 mLs (2.5 mg total) by nebulization every 6 (six) hours as needed for wheezing or shortness of breath. What changed: Another medication with the same name was changed. Make sure you understand how and when to take each.   albuterol 108 (90 Base) MCG/ACT inhaler Commonly known as: VENTOLIN HFA INHALE 2 PUFFS INTO THE LUNGS EVERY 4 HOURS AS NEEDED FOR WHEEZING OR SHORTNESS OF BREATH What changed: See the new instructions.   amLODipine 5 MG tablet Commonly known as: NORVASC Take 5 mg by mouth 2 (two) times daily.   aspirin 81 MG EC tablet Take 1 tablet (81 mg total) by mouth daily.   Biktarvy 50-200-25 MG Tabs tablet Generic drug: bictegravir-emtricitabine-tenofovir AF Take 1 tablet by mouth daily.   carvedilol 6.25 MG tablet Commonly known as: COREG Take 1 tablet (6.25 mg total) by mouth 2 (two) times daily with a meal. What changed:    medication strength  how much to take   Dulera 100-5 MCG/ACT Aero Generic drug: mometasone-formoterol Inhale 2 puffs into the lungs every morning.   hydrALAZINE 50 MG tablet Commonly known as: APRESOLINE Take 1 tablet (50 mg total) by mouth 2 (two) times daily.   potassium chloride 10 MEQ tablet Commonly known as: KLOR-CON Take 2 tablets (20 mEq total) by mouth daily.   pregabalin 150 MG capsule Commonly known as: LYRICA Take 150 mg by mouth 2 (two) times daily.   rosuvastatin 20 MG tablet Commonly known as: CRESTOR Take 1 tablet (20 mg total) by mouth daily at 6 PM. What changed:   medication strength  how much to take  when to take this   traZODone 100 MG tablet Commonly known as: DESYREL Take 100 mg by mouth at bedtime.       No.   The elevated Troponin was due to the acute medical illness (demand ischemia).   Outstanding Labs/Studies   Outpatient nephrology referral (sent at discharge), outpatient sleep study ( message sent to the office)  BMET at PCP follow up  FLP/LFTs in 8 weeks  Duration of Discharge Encounter   Greater than 30 minutes including physician time.  Signed, Reino Bellis NP-C 04/05/2019, 12:04 PM

## 2019-04-07 ENCOUNTER — Other Ambulatory Visit: Payer: Medicaid Other

## 2019-04-07 ENCOUNTER — Other Ambulatory Visit: Payer: Self-pay

## 2019-04-07 DIAGNOSIS — B20 Human immunodeficiency virus [HIV] disease: Secondary | ICD-10-CM

## 2019-04-08 ENCOUNTER — Other Ambulatory Visit: Payer: Self-pay | Admitting: Cardiovascular Disease

## 2019-04-08 DIAGNOSIS — G4733 Obstructive sleep apnea (adult) (pediatric): Secondary | ICD-10-CM

## 2019-04-08 DIAGNOSIS — J441 Chronic obstructive pulmonary disease with (acute) exacerbation: Secondary | ICD-10-CM

## 2019-04-08 DIAGNOSIS — I5033 Acute on chronic diastolic (congestive) heart failure: Secondary | ICD-10-CM

## 2019-04-08 LAB — T-HELPER CELL (CD4) - (RCID CLINIC ONLY)
CD4 % Helper T Cell: 42 % (ref 33–65)
CD4 T Cell Abs: 1077 /uL (ref 400–1790)

## 2019-04-09 ENCOUNTER — Other Ambulatory Visit: Payer: Self-pay

## 2019-04-09 ENCOUNTER — Encounter: Payer: Self-pay | Admitting: Infectious Disease

## 2019-04-09 ENCOUNTER — Ambulatory Visit (INDEPENDENT_AMBULATORY_CARE_PROVIDER_SITE_OTHER): Payer: Medicaid Other | Admitting: Pharmacist

## 2019-04-09 ENCOUNTER — Ambulatory Visit (INDEPENDENT_AMBULATORY_CARE_PROVIDER_SITE_OTHER): Payer: Medicaid Other | Admitting: Infectious Disease

## 2019-04-09 VITALS — BP 128/79 | HR 71 | Temp 98.3°F | Wt 169.0 lb

## 2019-04-09 DIAGNOSIS — I5040 Unspecified combined systolic (congestive) and diastolic (congestive) heart failure: Secondary | ICD-10-CM

## 2019-04-09 DIAGNOSIS — G4733 Obstructive sleep apnea (adult) (pediatric): Secondary | ICD-10-CM

## 2019-04-09 DIAGNOSIS — B20 Human immunodeficiency virus [HIV] disease: Secondary | ICD-10-CM

## 2019-04-09 DIAGNOSIS — N183 Chronic kidney disease, stage 3 unspecified: Secondary | ICD-10-CM | POA: Diagnosis not present

## 2019-04-09 DIAGNOSIS — I480 Paroxysmal atrial fibrillation: Secondary | ICD-10-CM | POA: Diagnosis not present

## 2019-04-09 DIAGNOSIS — Z720 Tobacco use: Secondary | ICD-10-CM | POA: Diagnosis not present

## 2019-04-09 DIAGNOSIS — I25110 Atherosclerotic heart disease of native coronary artery with unstable angina pectoris: Secondary | ICD-10-CM

## 2019-04-09 DIAGNOSIS — N289 Disorder of kidney and ureter, unspecified: Secondary | ICD-10-CM

## 2019-04-09 DIAGNOSIS — I110 Hypertensive heart disease with heart failure: Secondary | ICD-10-CM

## 2019-04-09 DIAGNOSIS — N189 Chronic kidney disease, unspecified: Secondary | ICD-10-CM | POA: Diagnosis not present

## 2019-04-09 MED ORDER — BIKTARVY 50-200-25 MG PO TABS: 1.0000 | ORAL_TABLET | Freq: Every day | ORAL | 11 refills | Status: DC

## 2019-04-09 MED FILL — BIKTARVY 50-200-25 MG TABS: 50-200-25 | 30 days supply | Qty: 30 | Fill #0

## 2019-04-09 NOTE — Progress Notes (Signed)
Patient saw [name] and is getting back into care. His medications will be filled at [facility].

## 2019-04-09 NOTE — Progress Notes (Signed)
Chief complaint: Follow-up for HIV disease  Subjective:    Patient ID: Kevin [name], male    DOB: 01/28/1956, 64 y.o.   MRN: [id number]  HPI  Kevin [name] is a 64 year old after an man with multiple medical problems including coronary artery disease, COPD, smoking, paroxysmal atrial fibrillation chronic diastolic congestive heart failure, peripheral vascular disease, obstructive sleep apnea who had actually tested positive for HIV nearly 12 years ago in 2006 while he was incarcerated.   In any case he tested positive via prompting of the best practice alert for CDC recommendations of patient's 1 when he came in for catheterization and workup for his peripheral vascular disease.  He was taking Biktarvy consistently and suppressed.  He then had not been seen since 2019  His wife was coming to see [name] for preexposure prophylaxis and she noted that her husband had been out of medications for roughly a month.  [name] sent a new prescription for Biktarvy which has been taking since then he has had repeat blood work done this Monday but blood work is not yet back.  In the interim he was hospitalized at [facility] in and treated for heart failure exacerbation and paroxysmal atrial fibrillation.    Lab Results  Component Value Date   HIV1RNAQUANT 43,600 (H) 03/17/2019   HIV1RNAQUANT <20 NOT DETECTED 08/15/2017   HIV1RNAQUANT <20 NOT DETECTED 03/01/2017     Lab Results  Component Value Date   CD4TABS 1,077 04/07/2019   CD4TABS 956 03/17/2019   CD4TABS 920 08/15/2017    Past Medical History:  Diagnosis Date  . AIDS (acquired immune deficiency syndrome) ([place]) 08/17/2016  . Chronic diastolic CHF (congestive heart failure), NYHA class 3 ([place]) 01/2016  . Chronic lower back pain   . CKD (chronic kidney disease), stage III   . COPD (chronic obstructive pulmonary disease) ([place])   . Gout    "forearms, hands, ankles, feet" (06/05/2016)  . Headache    "weekly" (06/05/2016)  . Heart  murmur   . Hypertension   . Hypertensive crisis 08/15/2017  . OSA on CPAP   . PAD (peripheral artery disease) ([place])   . PAF (paroxysmal atrial fibrillation) ([place]) 01/2016    Past Surgical History:  Procedure Laterality Date  . LEFT HEART CATH AND CORONARY ANGIOGRAPHY N/A 10/23/2016   Procedure: LEFT HEART CATH AND CORONARY ANGIOGRAPHY;  Surgeon: Leonie Man, MD;  Location: [place] [facility];  Service: Cardiovascular;  Laterality: N/A;  . LOWER EXTREMITY ANGIOGRAPHY N/A 07/17/2016   Procedure: Lower Extremity Angiography;  Surgeon: Lorretta Harp, MD;  Location: [place] [facility];  Service: Cardiovascular;  Laterality: N/A;  . LOWER EXTREMITY INTERVENTION N/A 06/05/2016   Procedure: Lower Extremity Intervention;  Surgeon: Lorretta Harp, MD;  Location: [place] [facility];  Service: Cardiovascular;  Laterality: N/A;  . PERIPHERAL VASCULAR ATHERECTOMY Right 07/17/2016   Procedure: Peripheral Vascular Atherectomy;  Surgeon: Lorretta Harp, MD;  Location: [place] [facility];  Service: Cardiovascular;  Laterality: Right;  SFA  . PERIPHERAL VASCULAR INTERVENTION  06/05/2016   Procedure: Peripheral Vascular Intervention;  Surgeon: Lorretta Harp, MD;  Location: [place] [facility];  Service: Cardiovascular;;  left SFA    Family History  Problem Relation Age of Onset  . High blood pressure Mother   . Lupus Mother       Social History   Socioeconomic History  . Marital status: Divorced    Spouse name: Not on file  . Number of  children: Not on file  . Years of education: Not on file  . Highest education level: Not on file  Occupational History  . Not on file  Tobacco Use  . Smoking status: Current Every Day Smoker    Packs/day: 0.25    Years: 42.00    Pack years: 10.50    Types: Cigarettes  . Smokeless tobacco: Never Used  . Tobacco comment: 4 a day  Substance and Sexual Activity  . Alcohol use: Yes    Alcohol/week: 2.0 standard drinks    Types: 2 Cans of beer per week    . Drug use: No  . Sexual activity: Not Currently  Other Topics Concern  . Not on file  Social History Narrative  . Not on file   Social Determinants of Health   Financial Resource Strain:   . Difficulty of Paying Living Expenses: Not on file  Food Insecurity:   . Worried About Charity fundraiser in the Last Year: Not on file  . Ran Out of Food in the Last Year: Not on file  Transportation Needs:   . Lack of Transportation (Medical): Not on file  . Lack of Transportation (Non-Medical): Not on file  Physical Activity:   . Days of Exercise per Week: Not on file  . Minutes of Exercise per Session: Not on file  Stress:   . Feeling of Stress : Not on file  Social Connections:   . Frequency of Communication with Friends and Family: Not on file  . Frequency of Social Gatherings with Friends and Family: Not on file  . Attends Religious Services: Not on file  . Active Member of Clubs or Organizations: Not on file  . Attends Archivist Meetings: Not on file  . Marital Status: Not on file    No Known Allergies   Current Outpatient Medications:  .  albuterol (PROVENTIL) (2.5 MG/3ML) 0.083% nebulizer solution, Take 3 mLs (2.5 mg total) by nebulization every 6 (six) hours as needed for wheezing or shortness of breath., Disp: 75 mL, Rfl: 12 .  albuterol (VENTOLIN HFA) 108 (90 Base) MCG/ACT inhaler, INHALE 2 PUFFS INTO THE LUNGS EVERY 4 HOURS AS NEEDED FOR WHEEZING OR SHORTNESS OF BREATH (Patient taking differently: Inhale 2 puffs into the lungs every 4 (four) hours as needed for wheezing or shortness of breath. ), Disp: 6.7 g, Rfl: 2 .  amLODipine (NORVASC) 5 MG tablet, Take 5 mg by mouth 2 (two) times daily., Disp: , Rfl:  .  aspirin EC 81 MG EC tablet, Take 1 tablet (81 mg total) by mouth daily., Disp: 90 tablet, Rfl: 0 .  bictegravir-emtricitabine-tenofovir AF (BIKTARVY) 50-200-25 MG TABS tablet, Take 1 tablet by mouth daily., Disp: 30 tablet, Rfl: 1 .  carvedilol (COREG) 6.25  MG tablet, Take 1 tablet (6.25 mg total) by mouth 2 (two) times daily with a meal., Disp: 60 tablet, Rfl: 0 .  hydrALAZINE (APRESOLINE) 50 MG tablet, Take 1 tablet (50 mg total) by mouth 2 (two) times daily., Disp: 60 tablet, Rfl: 0 .  mometasone-formoterol (DULERA) 100-5 MCG/ACT AERO, Inhale 2 puffs into the lungs every morning., Disp: 13 g, Rfl: 2 .  potassium chloride (K-DUR) 10 MEQ tablet, Take 2 tablets (20 mEq total) by mouth daily., Disp: 180 tablet, Rfl: 3 .  pregabalin (LYRICA) 150 MG capsule, Take 150 mg by mouth 2 (two) times daily., Disp: , Rfl:  .  rosuvastatin (CRESTOR) 20 MG tablet, Take 1 tablet (20 mg total) by mouth daily  at 6 PM., Disp: 90 tablet, Rfl: 0 .  traZODone (DESYREL) 100 MG tablet, Take 100 mg by mouth at bedtime., Disp: , Rfl:     Review of Systems  Constitutional: Negative for chills and fever.  Respiratory: Positive for shortness of breath. Negative for cough and wheezing.   Cardiovascular: Negative for chest pain, palpitations and leg swelling.  Gastrointestinal: Negative for abdominal pain.  Genitourinary: Negative for dysuria, flank pain and hematuria.  Musculoskeletal: Negative for back pain and myalgias.  Skin: Negative for rash.  Neurological: Negative for dizziness.  Hematological: Does not bruise/bleed easily.  Psychiatric/Behavioral: Negative for agitation, behavioral problems, confusion, decreased concentration, dysphoric mood, hallucinations and suicidal ideas. The patient is not nervous/anxious and is not hyperactive.        Objective:   Physical Exam  Constitutional: He is oriented to person, place, and time. No distress.  HENT:  Head: Normocephalic and atraumatic.  Mouth/Throat: No oropharyngeal exudate.  Eyes: Conjunctivae and EOM are normal. No scleral icterus.  Cardiovascular: Normal rate, regular rhythm and intact distal pulses. Exam reveals no gallop and no friction rub.  No murmur heard. Pulmonary/Chest: Effort normal and breath  sounds normal. No respiratory distress. He has no wheezes.  Abdominal: Soft. He exhibits no distension.  Musculoskeletal:        General: No tenderness or edema.     Cervical back: Normal range of motion and neck supple.  Neurological: He is alert and oriented to person, place, and time. He exhibits normal muscle tone. Coordination normal.  Skin: Skin is warm and dry. No rash noted. He is not diaphoretic. No erythema. No pallor.  Psychiatric: He has a normal mood and affect. His speech is normal and behavior is normal. Judgment and thought content normal. His affect is not blunt. Cognition and memory are normal.  Nursing note reviewed.         Assessment & Plan:   HIV disease: continue BIKTARVY and will fill it was a long pharmacy and shipped to him.  We will have him come back for labs again in a month and see me in 6 weeks time.  PAD being followed closely by [facility]  PAF, CHF followed by Cardiology, HF and by primary

## 2019-04-10 LAB — HIV-1 RNA QUANT-NO REFLEX-BLD
HIV 1 RNA Quant: <20 copies/mL — AB
HIV-1 RNA Quant, Log: <1.3 Log copies/mL — AB

## 2019-04-19 ENCOUNTER — Other Ambulatory Visit (HOSPITAL_COMMUNITY)
Admission: RE | Admit: 2019-04-19 | Discharge: 2019-04-19 | Disposition: A | Discharge status: Home / Self Care | Payer: Medicaid Other | Source: Ambulatory Visit | Attending: Cardiovascular Disease | Admitting: Cardiovascular Disease

## 2019-04-19 DIAGNOSIS — Z01812 Encounter for preprocedural laboratory examination: Secondary | ICD-10-CM | POA: Diagnosis not present

## 2019-04-19 DIAGNOSIS — Z20822 Contact with and (suspected) exposure to covid-19: Secondary | ICD-10-CM | POA: Insufficient documentation

## 2019-04-19 LAB — SARS CORONAVIRUS 2 (TAT 6-24 HRS): SARS Coronavirus 2: NEGATIVE

## 2019-04-22 ENCOUNTER — Other Ambulatory Visit: Payer: Self-pay

## 2019-04-22 ENCOUNTER — Ambulatory Visit (HOSPITAL_BASED_OUTPATIENT_CLINIC_OR_DEPARTMENT_OTHER): Payer: Medicaid Other | Admitting: Cardiovascular Disease

## 2019-04-22 NOTE — Progress Notes (Unsigned)
Patient arrived for a Split Study on 04/22/19 and was unable to complete the study because he could not find a comfortable mask.  Tech tried Nasal Pillows, Nasal Mask and Full Face.  The patient and I quote "I misunderstood the doctor.  I thought it was an oxygen cannula that goes in your nose. I would like to try other things if possible.  I really want to find something that will work."  Patient also stated if he was required to have another study he would prefer it be done during the daytime.  Gwenyth Allegra, RPSGT

## 2019-05-05 ENCOUNTER — Other Ambulatory Visit (HOSPITAL_COMMUNITY)
Admission: RE | Admit: 2019-05-05 | Discharge: 2019-05-05 | Disposition: A | Discharge status: Home / Self Care | Payer: Medicaid Other | Source: Ambulatory Visit | Attending: Cardiovascular Disease | Admitting: Cardiovascular Disease

## 2019-05-05 DIAGNOSIS — Z01812 Encounter for preprocedural laboratory examination: Secondary | ICD-10-CM | POA: Diagnosis not present

## 2019-05-05 DIAGNOSIS — Z20822 Contact with and (suspected) exposure to covid-19: Secondary | ICD-10-CM | POA: Diagnosis not present

## 2019-05-05 LAB — SARS CORONAVIRUS 2 (TAT 6-24 HRS): SARS Coronavirus 2: NEGATIVE

## 2019-05-07 ENCOUNTER — Other Ambulatory Visit: Payer: Self-pay

## 2019-05-07 ENCOUNTER — Ambulatory Visit (HOSPITAL_BASED_OUTPATIENT_CLINIC_OR_DEPARTMENT_OTHER): Payer: Medicaid Other | Attending: Cardiovascular Disease | Admitting: Cardiovascular Disease

## 2019-05-07 DIAGNOSIS — G4731 Primary central sleep apnea: Secondary | ICD-10-CM | POA: Diagnosis not present

## 2019-05-07 DIAGNOSIS — Z79899 Other long term (current) drug therapy: Secondary | ICD-10-CM | POA: Insufficient documentation

## 2019-05-07 DIAGNOSIS — I493 Ventricular premature depolarization: Secondary | ICD-10-CM | POA: Insufficient documentation

## 2019-05-07 DIAGNOSIS — J441 Chronic obstructive pulmonary disease with (acute) exacerbation: Secondary | ICD-10-CM | POA: Insufficient documentation

## 2019-05-07 DIAGNOSIS — G4733 Obstructive sleep apnea (adult) (pediatric): Secondary | ICD-10-CM | POA: Diagnosis not present

## 2019-05-07 DIAGNOSIS — Z7951 Long term (current) use of inhaled steroids: Secondary | ICD-10-CM | POA: Insufficient documentation

## 2019-05-07 DIAGNOSIS — I5033 Acute on chronic diastolic (congestive) heart failure: Secondary | ICD-10-CM | POA: Diagnosis not present

## 2019-05-07 DIAGNOSIS — Z7982 Long term (current) use of aspirin: Secondary | ICD-10-CM | POA: Diagnosis not present

## 2019-05-11 ENCOUNTER — Other Ambulatory Visit: Payer: Self-pay | Admitting: Family Medicine

## 2019-05-13 ENCOUNTER — Encounter (HOSPITAL_BASED_OUTPATIENT_CLINIC_OR_DEPARTMENT_OTHER): Payer: Self-pay | Admitting: Cardiovascular Disease

## 2019-05-13 NOTE — Procedures (Signed)
Patient Name: Kevin [name], Kevin [name] Date: 05/07/2019 Gender: Male D.O.B: 02-Jan-1956 Age (years): 50 Referring Provider: Shelva Majestic MD, ABSM Height (inches): 70 Interpreting Physician: Shelva Majestic MD, ABSM Weight (lbs): 180 RPSGT: Jacolyn Reedy BMI: 26 MRN: [id number] Neck Size: 15.00  CLINICAL INFORMATION The patient is referred for a split night study with BPAP.  MEDICATIONS albuterol (PROVENTIL) (2.5 MG/3ML) 0.083% nebulizer solution  albuterol (VENTOLIN HFA) 108 (90 Base) MCG/ACT inhaler  amLODipine (NORVASC) 5 MG tablet  aspirin EC 81 MG EC tablet  bictegravir-emtricitabine-tenofovir AF (BIKTARVY) 50-200-25 MG TABS tablet  carvedilol (COREG) 6.25 MG tablet  hydrALAZINE (APRESOLINE) 50 MG tablet  mometasone-formoterol (DULERA) 100-5 MCG/ACT AERO  potassium chloride (K-DUR) 10 MEQ tablet  pregabalin (LYRICA) 150 MG capsule  rosuvastatin (CRESTOR) 20 MG tablet  traZODone (DESYREL) 100 MG tablet  Medications self-administered by patient taken the night of the study : N/A  SLEEP STUDY TECHNIQUE As per the AASM Manual for the Scoring of Sleep and Associated Events v2.3 (April 2016) with a hypopnea requiring 4% desaturations.  The channels recorded and monitored were frontal, central and occipital EEG, electrooculogram (EOG), submentalis EMG (chin), nasal and oral airflow, thoracic and abdominal wall motion, anterior tibialis EMG, snore microphone, electrocardiogram, and pulse oximetry. Bi-level positive airway pressure (BiPAP) was initiated when the patient met split night criteria and was titrated according to treat sleep-disordered breathing.  RESPIRATORY PARAMETERS Diagnostic Total AHI (/hr): 62.9 RDI (/hr): 70.2 OA Index (/hr): 4.4 CA Index (/hr): 58.5 REM AHI (/hr): 6.7 NREM AHI (/hr): 67.4 Supine AHI (/hr): N/A Non-supine AHI (/hr): 70.24 Min O2 Sat (%): 92.0 Mean O2 (%): 96.0 Time below 88% (min): 0   Titration Optimal IPAP Pressure  (cm):  Optimal EPAP Pressure (cm):  AHI at Optimal Pressure (/hr): N/A Min O2 at Optimal Pressure (%): 93.0 Sleep % at Optimal (%): N/A Supine % at Optimal (%): N/A   SLEEP ARCHITECTURE The study was initiated at 8:34:53 AM and terminated at 2:42:38 PM. The total recorded time was 367.8 minutes. EEG confirmed total sleep time was 246 minutes yielding a sleep efficiency of 66.9%%. Sleep onset after lights out was 8.6 minutes with a REM latency of 59.5 minutes. The patient spent 30.7%% of the night in stage N1 sleep, 60.0%% in stage N2 sleep, 0.0%% in stage N3 and 9.4% in REM. Wake after sleep onset (WASO) was 113.1 minutes. The Arousal Index was 58.5/hour.  LEG MOVEMENT DATA The total Periodic Limb Movements of Sleep (PLMS) were 0. The PLMS index was 0.0 .  CARDIAC DATA The 2 lead EKG demonstrated sinus rhythm. The mean heart rate was 100.0 beats per minute. Other EKG findings include: PVCs.  IMPRESSIONS - Severe obstructive sleep apnea occurred during the diagnostic portion of the study (AHI 62.9 /h; RDI 70.2/h). BiPAP was started at 8/4 and was titrated to 18/14 cm of water. Patient had frequent central events throughout the titration. AHI at 18/14 was 80.4/h.  Due to predominant central events recommend an  ASV titration study.  - Severe central sleep apnea occurred during the diagnostic portion of the study (CAI = 58.5/hour). - The patient did not have oxygen desaturation during the diagnostic portion of the study (Min O2 = 92.0%) - The patient snored with soft snoring volume during the diagnostic portion of the study. - EKG findings include PVCs. - Clinically significant periodic limb movements of sleep did not occur during the study.  DIAGNOSIS - Obstructive Sleep Apnea (327.23 [G47.33 ICD-10]) - Central  sleep apnea  RECOMMENDATIONS - Due to predominant central events in this patient with normal LV function (EF 55-60% on echo 04/04/2019), recommend an expeditious ASV titration  study. - Effort should be made to optimize nasal and oropharyngeal patency and volume status.  - Avoid alcohol, sedatives and other CNS depressants that may worsen sleep apnea and disrupt normal sleep architecture. - Sleep hygiene should be reviewed to assess factors that may improve sleep quality. - Weight management and regular exercise should be initiated or continued. - Return to Sleep Center for re-evaluation.  [Electronically signed] 05/13/2019 04:13 PM  Shelva Majestic MD, [facility], ABSM Diplomate, American Board of Sleep Medicine   NPI: [id number] [place] PH: [phone]   FX: [phone] [place]

## 2019-05-21 ENCOUNTER — Ambulatory Visit: Payer: Medicaid Other | Admitting: Infectious Disease

## 2019-05-27 ENCOUNTER — Telehealth: Payer: Self-pay | Admitting: *Deleted

## 2019-05-27 DIAGNOSIS — G4733 Obstructive sleep apnea (adult) (pediatric): Secondary | ICD-10-CM

## 2019-05-27 NOTE — Telephone Encounter (Signed)
-----   Message from [name], MD sent at 05/13/2019  4:23 PM EDT ----- Kevin [name], pt failed BiPAP with significant central apnea.  Schedule for expeditious ASV titration study.

## 2019-05-28 ENCOUNTER — Ambulatory Visit: Payer: Medicaid Other | Admitting: Infectious Disease

## 2019-05-28 NOTE — Telephone Encounter (Signed)
Patient is returning your call.  

## 2019-06-02 NOTE — Telephone Encounter (Signed)
Informed patient of sleep study results and patient understanding was verbalized. Patient understands his sleep study showed Severe obstructive sleep apnea occurred during the diagnostic portion of the study (AHI 62.9 /h; Patient understands dr Claiborne Billings recommends an expeditious ASV titration study. Pt is aware and agreeable to his results.  ASV titration sent to sleep pool.

## 2019-06-02 NOTE — Telephone Encounter (Signed)
Patient is scheduled for ASV Titration on 06/10/19. Pt is scheduled for COVID screening on 06/07/19 11:30 prior to titration.  Patient understands his ASV titration study will be done at [facility] sleep lab. Patient understands he will receive a letter in a week or so detailing appointment, date, time, and location. Patient understands to call if he does not receive the letter  in a timely manner. Patient agrees with treatment and thanked me for call.

## 2019-06-04 ENCOUNTER — Encounter: Payer: Self-pay | Admitting: Infectious Disease

## 2019-06-04 ENCOUNTER — Other Ambulatory Visit: Payer: Self-pay

## 2019-06-04 ENCOUNTER — Ambulatory Visit (INDEPENDENT_AMBULATORY_CARE_PROVIDER_SITE_OTHER): Payer: Medicaid Other | Admitting: Infectious Disease

## 2019-06-04 VITALS — BP 210/110 | HR 54 | Temp 98.5°F | Ht 72.0 in | Wt 165.0 lb

## 2019-06-04 DIAGNOSIS — I480 Paroxysmal atrial fibrillation: Secondary | ICD-10-CM

## 2019-06-04 DIAGNOSIS — I10 Essential (primary) hypertension: Secondary | ICD-10-CM

## 2019-06-04 DIAGNOSIS — G4733 Obstructive sleep apnea (adult) (pediatric): Secondary | ICD-10-CM | POA: Diagnosis not present

## 2019-06-04 DIAGNOSIS — N183 Chronic kidney disease, stage 3 unspecified: Secondary | ICD-10-CM | POA: Diagnosis not present

## 2019-06-04 DIAGNOSIS — B20 Human immunodeficiency virus [HIV] disease: Secondary | ICD-10-CM

## 2019-06-04 DIAGNOSIS — I5033 Acute on chronic diastolic (congestive) heart failure: Secondary | ICD-10-CM

## 2019-06-04 DIAGNOSIS — I25110 Atherosclerotic heart disease of native coronary artery with unstable angina pectoris: Secondary | ICD-10-CM

## 2019-06-04 MED ORDER — BIKTARVY 50-200-25 MG PO TABS: 1.0000 | ORAL_TABLET | Freq: Every day | ORAL | 11 refills | Status: DC

## 2019-06-04 NOTE — Progress Notes (Signed)
Chief complaint: Follow-up for HIV disease, he is concerned about the fact that he has significant sleep apnea  Subjective:    Patient ID: Kevin [name], male    DOB: 1955-12-11, 64 y.o.   MRN: [id number]  HPI  Mr. 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   In any case he tested positive via prompting of the best practice alert for CDC recommendations of patient's 1 when he came in for catheterization and workup for his peripheral vascular disease.  He was taking Biktarvy consistently and suppressed.  He then had not been seen since 2019  His wife was coming to see [name] for preexposure prophylaxis and she noted that her husband had been out of medications for roughly a month.  [name] sent a new prescription for Biktarvy which has been taking since the  In the interim he was hospitalized at [facility] in and treated for heart failure exacerbation and paroxysmal atrial fibrillation.  He is resuppressed his virus.  He also had a recent sleep study which showed them that he stops breathing 65 times a night.  He is due for another follow-up study.  He claims that the CPAP machine does not work.  His blood pressure was completely out of control and he came to clinic in the 607P systolic.  He said he did not take his blood pressure medications this morning.   Past Medical History:  Diagnosis Date  . AIDS (acquired immune deficiency syndrome) ([place]) 08/17/2016  . Chronic diastolic CHF (congestive heart failure), NYHA class 3 ([place]) 01/2016  . Chronic lower back pain   . CKD (chronic kidney disease), stage III   . COPD (chronic obstructive pulmonary disease) ([place])   . Gout    "forearms,  hands, ankles, feet" (06/05/2016)  . Headache    "weekly" (06/05/2016)  . Heart murmur   . Hypertension   . Hypertensive crisis 08/15/2017  . OSA on CPAP   . PAD (peripheral artery disease) ([place])   . PAF (paroxysmal atrial fibrillation) ([place]) 01/2016    Past Surgical History:  Procedure Laterality Date  . LEFT HEART CATH AND CORONARY ANGIOGRAPHY N/A 10/23/2016   Procedure: LEFT HEART CATH AND CORONARY ANGIOGRAPHY;  Surgeon: Leonie Man, MD;  Location: [place] [facility];  Service: Cardiovascular;  Laterality: N/A;  . LOWER EXTREMITY ANGIOGRAPHY N/A 07/17/2016   Procedure: Lower Extremity Angiography;  Surgeon: Lorretta Harp, MD;  Location: [place] [facility];  Service: Cardiovascular;  Laterality: N/A;  . LOWER EXTREMITY INTERVENTION N/A 06/05/2016   Procedure: Lower Extremity Intervention;  Surgeon: Lorretta Harp, MD;  Location: [place] [facility];  Service: Cardiovascular;  Laterality: N/A;  . PERIPHERAL VASCULAR ATHERECTOMY Right 07/17/2016   Procedure: Peripheral Vascular Atherectomy;  Surgeon: Lorretta Harp, MD;  Location: [place] [facility];  Service: Cardiovascular;  Laterality: Right;  SFA  . PERIPHERAL VASCULAR INTERVENTION  06/05/2016   Procedure: Peripheral Vascular Intervention;  Surgeon: Lorretta Harp, MD;  Location: [place] [facility];  Service: Cardiovascular;;  left SFA    Family History  Problem Relation Age of Onset  . High blood pressure Mother   . Lupus Mother       Social History   Socioeconomic History  .  Marital status: Divorced    Spouse name: Not on file  . Number of children: Not on file  . Years of education: Not on file  . Highest education level: Not on file  Occupational History  . Not on file  Tobacco Use  . Smoking status: Current Every Day Smoker    Packs/day: 0.25    Years: 42.00    Pack years: 10.50    Types: Cigarettes  . Smokeless tobacco: Never Used  . Tobacco comment: 4 a day  Substance and Sexual Activity  . Alcohol  use: Yes    Alcohol/week: 2.0 standard drinks    Types: 2 Cans of beer per week  . Drug use: No  . Sexual activity: Not Currently    Comment: offered condoms  Other Topics Concern  . Not on file  Social History Narrative  . Not on file   Social Determinants of Health   Financial Resource Strain:   . Difficulty of Paying Living Expenses:   Food Insecurity:   . Worried About Charity fundraiser in the Last Year:   . Arboriculturist in the Last Year:   Transportation Needs:   . Film/video editor (Medical):   Marland Kitchen Lack of Transportation (Non-Medical):   Physical Activity:   . Days of Exercise per Week:   . Minutes of Exercise per Session:   Stress:   . Feeling of Stress :   Social Connections:   . Frequency of Communication with Friends and Family:   . Frequency of Social Gatherings with Friends and Family:   . Attends Religious Services:   . Active Member of Clubs or Organizations:   . Attends Archivist Meetings:   Marland Kitchen Marital Status:     No Known Allergies   Current Outpatient Medications:  .  albuterol (PROVENTIL) (2.5 MG/3ML) 0.083% nebulizer solution, Take 3 mLs (2.5 mg total) by nebulization every 6 (six) hours as needed for wheezing or shortness of breath., Disp: 75 mL, Rfl: 12 .  albuterol (VENTOLIN HFA) 108 (90 Base) MCG/ACT inhaler, INHALE 2 PUFFS INTO THE LUNGS EVERY 4 HOURS AS NEEDED FOR WHEEZING OR SHORTNESS OF BREATH, Disp: 6.7 g, Rfl: 2 .  amLODipine (NORVASC) 5 MG tablet, Take 5 mg by mouth 2 (two) times daily., Disp: , Rfl:  .  aspirin EC 81 MG EC tablet, Take 1 tablet (81 mg total) by mouth daily., Disp: 90 tablet, Rfl: 0 .  bictegravir-emtricitabine-tenofovir AF (BIKTARVY) 50-200-25 MG TABS tablet, Take 1 tablet by mouth daily., Disp: 30 tablet, Rfl: 11 .  carvedilol (COREG) 6.25 MG tablet, Take 1 tablet (6.25 mg total) by mouth 2 (two) times daily with a meal., Disp: 60 tablet, Rfl: 0 .  hydrALAZINE (APRESOLINE) 50 MG tablet, Take 1 tablet (50  mg total) by mouth 2 (two) times daily., Disp: 60 tablet, Rfl: 0 .  potassium chloride (K-DUR) 10 MEQ tablet, Take 2 tablets (20 mEq total) by mouth daily., Disp: 180 tablet, Rfl: 3 .  rosuvastatin (CRESTOR) 20 MG tablet, Take 1 tablet (20 mg total) by mouth daily at 6 PM., Disp: 90 tablet, Rfl: 0 .  mometasone-formoterol (DULERA) 100-5 MCG/ACT AERO, Inhale 2 puffs into the lungs every morning. (Patient not taking: Reported on 06/04/2019), Disp: 13 g, Rfl: 2 .  pregabalin (LYRICA) 150 MG capsule, Take 150 mg by mouth 2 (two) times daily., Disp: , Rfl:  .  traZODone (DESYREL) 100 MG tablet, Take 100 mg by mouth at bedtime., Disp: ,  Rfl:     Review of Systems  Constitutional: Positive for appetite change. Negative for chills and fever.  Respiratory: Positive for apnea and shortness of breath. Negative for cough and wheezing.   Cardiovascular: Negative for chest pain, palpitations and leg swelling.  Gastrointestinal: Negative for abdominal pain.  Genitourinary: Negative for dysuria, flank pain and hematuria.  Musculoskeletal: Negative for back pain and myalgias.  Skin: Negative for rash.  Neurological: Negative for dizziness.  Hematological: Does not bruise/bleed easily.  Psychiatric/Behavioral: Negative for agitation, behavioral problems, confusion, decreased concentration, dysphoric mood, hallucinations and suicidal ideas. The patient is nervous/anxious. The patient is not hyperactive.        Objective:   Physical Exam  Constitutional: He is oriented to person, place, and time. No distress.  HENT:  Head: Normocephalic and atraumatic.  Mouth/Throat: No oropharyngeal exudate.  Eyes: Conjunctivae and EOM are normal. No scleral icterus.  Cardiovascular: Normal rate, regular rhythm and intact distal pulses. Exam reveals no gallop and no friction rub.  No murmur heard. Pulmonary/Chest: Effort normal and breath sounds normal. No respiratory distress. He has no wheezes.  Abdominal: Soft. He  exhibits no distension.  Musculoskeletal:        General: No tenderness or edema.     Cervical back: Normal range of motion and neck supple.  Neurological: He is alert and oriented to person, place, and time. He exhibits normal muscle tone. Coordination normal.  Skin: Skin is warm and dry. No rash noted. He is not diaphoretic. No erythema. No pallor.  Psychiatric: His speech is normal and behavior is normal. Judgment and thought content normal. His affect is not blunt. Cognition and memory are normal. He exhibits a depressed mood.  Nursing note reviewed.         Assessment & Plan:   HIV disease: continue BIKTARVY return to clinic in 4 months PAD being followed closely by [facility]  PAF, CHF followed by Cardiology, HF and by primary   sleep apnea due to having a second sleep study soon.  Hypertension completely out of control needs to take blood pressure pressure medications and follow-up PCP

## 2019-06-07 ENCOUNTER — Other Ambulatory Visit (HOSPITAL_COMMUNITY)
Admission: RE | Admit: 2019-06-07 | Discharge: 2019-06-07 | Disposition: A | Discharge status: Home / Self Care | Payer: Medicaid Other | Source: Ambulatory Visit | Attending: Cardiovascular Disease | Admitting: Cardiovascular Disease

## 2019-06-07 DIAGNOSIS — Z01812 Encounter for preprocedural laboratory examination: Secondary | ICD-10-CM | POA: Diagnosis present

## 2019-06-07 DIAGNOSIS — Z20822 Contact with and (suspected) exposure to covid-19: Secondary | ICD-10-CM | POA: Insufficient documentation

## 2019-06-07 LAB — SARS CORONAVIRUS 2 (TAT 6-24 HRS): SARS Coronavirus 2: NEGATIVE

## 2019-06-10 ENCOUNTER — Other Ambulatory Visit: Payer: Self-pay

## 2019-06-10 ENCOUNTER — Ambulatory Visit (HOSPITAL_BASED_OUTPATIENT_CLINIC_OR_DEPARTMENT_OTHER): Payer: Medicaid Other | Attending: Cardiovascular Disease | Admitting: Cardiovascular Disease

## 2019-06-10 DIAGNOSIS — Z7982 Long term (current) use of aspirin: Secondary | ICD-10-CM | POA: Diagnosis not present

## 2019-06-10 DIAGNOSIS — I493 Ventricular premature depolarization: Secondary | ICD-10-CM | POA: Diagnosis not present

## 2019-06-10 DIAGNOSIS — G4733 Obstructive sleep apnea (adult) (pediatric): Secondary | ICD-10-CM | POA: Diagnosis present

## 2019-06-10 DIAGNOSIS — Z79899 Other long term (current) drug therapy: Secondary | ICD-10-CM | POA: Diagnosis not present

## 2019-06-15 ENCOUNTER — Encounter (HOSPITAL_BASED_OUTPATIENT_CLINIC_OR_DEPARTMENT_OTHER): Payer: Self-pay | Admitting: Cardiovascular Disease

## 2019-06-15 NOTE — Procedures (Signed)
Patient Name: Kevin, [name] Date: 06/10/2019 Gender: Male D.O.B: Nov 21, 1955 Age (years): 62 Referring Provider: Shelva Majestic MD, ABSM Height (inches): 70 Interpreting Physician: Shelva Majestic MD, ABSM Weight (lbs): 180 RPSGT: Jacolyn Reedy BMI: 26 MRN: [id number] Neck Size: 15.00  CLINICAL INFORMATION The patient is referred for a adaptive servo-ventilator titration study. Most recent titration study dated 05/07/2019 revealed an AHI of 62.9/h and RDI 70.2/h. Pt failed BIPAP titration with frequent central events up to 18/14.  MEDICATIONS albuterol (PROVENTIL) (2.5 MG/3ML) 0.083% nebulizer solution  albuterol (VENTOLIN HFA) 108 (90 Base) MCG/ACT inhaler  amLODipine (NORVASC) 5 MG tablet  aspirin EC 81 MG EC tablet  bictegravir-emtricitabine-tenofovir AF (BIKTARVY) 50-200-25 MG TABS tablet  carvedilol (COREG) 6.25 MG tablet  hydrALAZINE (APRESOLINE) 50 MG tablet  mometasone-formoterol (DULERA) 100-5 MCG/ACT AERO  potassium chloride (K-DUR) 10 MEQ tablet  pregabalin (LYRICA) 150 MG capsule  rosuvastatin (CRESTOR) 20 MG tablet  traZODone (DESYREL) 100 MG tablet  Medications self-administered by patient taken the night of the study : N/A  SLEEP STUDY TECHNIQUE As per the AASM Manual for the Scoring of Sleep and Associated Events v2.3 (April 2016) with a hypopnea requiring 4% desaturations.  The channels recorded and monitored were frontal, central and occipital EEG, electrooculogram (EOG), submentalis EMG (chin), nasal and oral airflow, thoracic and abdominal wall motion, anterior tibialis EMG, snore microphone, electrocardiogram, and pulse oximetry.  RESPIRATORY PARAMETERS Optimal Min IPAP (cm): 7 Optimal Max IPAP (cm): 7 Optimal Min EPAP (cm): 7 Optimal Max EPAP (cm): 7 Optimal Max Pressure (cm): 15 Optimal Min PS (cm): 5 Optimal Max PS (cm): 15 Opitmal Breathing Rate (/min): Auto Overall Min O2 (%): 92.0 Min O2 at Optimal Pressure (%): 92.0 AHI at  Optimal (/hr): N/A   SLEEP ARCHITECTURE During a recording time of 358 minutes, the patient slept for 285 minutes. Sleep efficiency was 79.6%%. The patient spent 21.1%% of the night in stage N1 sleep, 57.9%% in stage N2 sleep, 0.0%% in stage N3 and 21.1% in REM. Wake after sleep onset (WASO) was 68.5 minutes. Alpha intrusion was absent. Supine sleep was 4.39%. The arousal index was 24.2.  LEG MOVEMENT DATA PLM Index (/hr): 84.6 PLM Arousal Index (/hr): 0.6  CARDIAC DATA The 2 lead EKG demonstrated sinus rhythm. The mean heart rate was 52.8 beats per minute. Other EKG findings include: PVCs.  IMPRESSIONS - No significant obstructive sleep apnea occurred during this study (AHI = 1.5/hour). An optimal ASV pressure was selected. - No significant central sleep apnea occurred during this study (CAI = 0.6). - The patient had minimal or no oxygen desaturation during the study (Min O2 = 92.0) - No snoring was audible during this study. - EKG findings include PVCs. - Clinically significant periodic limb movements did not occur during sleep.  DIAGNOSIS - Obstructive Sleep Apnea (327.23 [G47.33 ICD-10])  RECOMMENDATIONS - Trial of SV Advance EPAP Min 7 and Max 7 cmH2O, Pressure Support Min 5 and Max 15 cmH2O with Max Pressure of 20 cmH2O and Breath Rate of Auto BrPM. - Effort should be made to optimize nasal and orpharyngeal patency. - Avoid alcohol, sedatives and other CNS depressants that may worsen sleep apnea and disrupt normal sleep architecture. - Sleep hygiene should be reviewed to assess factors that may improve sleep quality. - Weight management and regular exercise should be initiated or continued. - Recommend a download in 30 days and sleep evaluation after 4 weeks of therapy.  [Electronically signed] 06/15/2019 03:14 PM  Shelva Majestic  MD, Tomasa Hose Diplomate, American Board of Sleep Medicine   NPI: [id number] [place] PH: [phone]   FX: [phone] [place]

## 2019-07-03 ENCOUNTER — Telehealth: Payer: Self-pay | Admitting: Pharmacist

## 2019-07-03 NOTE — Telephone Encounter (Signed)
Error

## 2019-07-10 ENCOUNTER — Other Ambulatory Visit: Payer: Self-pay | Admitting: Cardiology

## 2019-07-17 MED FILL — BIKTARVY 50-200-25 MG TABS: 50-200-25 | 30 days supply | Qty: 30 | Fill #1

## 2019-07-25 ENCOUNTER — Telehealth: Payer: Self-pay | Admitting: *Deleted

## 2019-07-25 NOTE — Telephone Encounter (Signed)
-----   Message from [name], MD sent at 06/15/2019  3:23 PM EDT ----- [name], please set up with DME for ASV set-up

## 2019-07-25 NOTE — Telephone Encounter (Signed)
Call results no vm set up.

## 2019-08-20 MED FILL — BIKTARVY 50-200-25 MG TABS: 50-200-25 | 30 days supply | Qty: 30 | Fill #2

## 2019-09-30 MED FILL — BIKTARVY 50-200-25 MG TABS: 50-200-25 | 30 days supply | Qty: 30 | Fill #3

## 2019-09-30 NOTE — Telephone Encounter (Signed)
Upon patient request DME selection is [facility]. Patient understands he will be contacted by [facility] to set up his cpap. Patient understands to call if [facility] does not contact him with new setup in a timely manner. Patient understands they will be called once confirmation has been received from [facility] that they have received their new machine to schedule 10 week follow up appointment.  [facility] notified of new cpap order  Please add to airview Patient was grateful for the call and thanked me. 

## 2019-09-30 NOTE — Telephone Encounter (Signed)
Informed patient of titration study results and patient understanding was verbalized. Patient understands his sleep study showed: IMPRESSIONS - No significant obstructive sleep apnea occurred during this study (AHI = 1.5/hour). An optimal ASV pressure was selected.. - The patient had minimal or no oxygen desaturation during the study (Min O2 = 92.0) - No snoring was audible during this study. - EKG findings include PVCs.  DIAGNOSIS - Obstructive Sleep Apnea (327.23 [G47.33 ICD-10])  RECOMMENDATIONS - Trial of SV Advance EPAP Min 7 and Max 7 cmH2O, Pressure Support Min 5 and Max 15 cmH2O with Max Pressure of 20 cmH2O and Breath Rate of Auto BrPM.

## 2019-10-06 ENCOUNTER — Ambulatory Visit: Payer: Medicaid Other | Admitting: Infectious Disease

## 2019-11-12 MED FILL — BIKTARVY 50-200-25 MG TABS: 50-200-25 | 30 days supply | Qty: 30 | Fill #4

## 2019-11-19 ENCOUNTER — Telehealth: Payer: Self-pay | Admitting: *Deleted

## 2019-11-19 ENCOUNTER — Telehealth: Payer: Self-pay | Admitting: Cardiovascular Disease

## 2019-11-19 NOTE — Telephone Encounter (Signed)
    Olivia Mackie from [facility] calling about an order they receive for Bipap ASV. She wanted to speak with [name], staff message sent to [name]

## 2019-11-19 NOTE — Telephone Encounter (Signed)
Mychart message sent to patient asking him to contact Olivia Mackie at [place]. Also contact our office to update his contact information. Pe Olivia Mackie numbers on file are incorrect.

## 2019-12-15 MED FILL — BIKTARVY 50-200-25 MG TABS: 50-200-25 | 30 days supply | Qty: 30 | Fill #5

## 2020-01-16 ENCOUNTER — Telehealth: Payer: Self-pay

## 2020-01-16 NOTE — Telephone Encounter (Signed)
[facility] Patient Advocate Encounter  [facility] and I have been unsuccsessful in reaching patient to be able to refill medication.    We have tried multiple times without a response.  Keiffer Piper, CPhT Specialty Pharmacy Patient Advocate [facility] Phone: [phone] Fax:  [phone]  

## 2020-05-13 ENCOUNTER — Other Ambulatory Visit (HOSPITAL_COMMUNITY): Payer: Self-pay

## 2020-10-25 ENCOUNTER — Ambulatory Visit (HOSPITAL_COMMUNITY)
Admission: EM | Admit: 2020-10-25 | Discharge: 2020-10-25 | Disposition: A | Discharge status: Home / Self Care | Payer: Medicaid Other | Attending: Internal Medicine | Admitting: Internal Medicine

## 2020-10-25 ENCOUNTER — Ambulatory Visit (INDEPENDENT_AMBULATORY_CARE_PROVIDER_SITE_OTHER): Payer: Medicaid Other

## 2020-10-25 ENCOUNTER — Other Ambulatory Visit: Payer: Self-pay

## 2020-10-25 ENCOUNTER — Encounter (HOSPITAL_COMMUNITY): Payer: Self-pay | Admitting: Emergency Medicine

## 2020-10-25 DIAGNOSIS — R0602 Shortness of breath: Secondary | ICD-10-CM | POA: Diagnosis not present

## 2020-10-25 DIAGNOSIS — B20 Human immunodeficiency virus [HIV] disease: Secondary | ICD-10-CM | POA: Insufficient documentation

## 2020-10-25 DIAGNOSIS — J441 Chronic obstructive pulmonary disease with (acute) exacerbation: Secondary | ICD-10-CM | POA: Diagnosis present

## 2020-10-25 LAB — CBC WITH DIFFERENTIAL/PLATELET
Abs Immature Granulocytes: 0.02 10*3/uL (ref 0.00–0.07)
Basophils Absolute: 0 10*3/uL (ref 0.0–0.1)
Basophils Relative: 0 %
Eosinophils Absolute: 0.7 10*3/uL — ABNORMAL HIGH (ref 0.0–0.5)
Eosinophils Relative: 8 %
HCT: 33 % — ABNORMAL LOW (ref 39.0–52.0)
Hemoglobin: 10.9 g/dL — ABNORMAL LOW (ref 13.0–17.0)
Immature Granulocytes: 0 %
Lymphocytes Relative: 21 %
Lymphs Abs: 2 10*3/uL (ref 0.7–4.0)
MCH: 30.6 pg (ref 26.0–34.0)
MCHC: 33 g/dL (ref 30.0–36.0)
MCV: 92.7 fL (ref 80.0–100.0)
Monocytes Absolute: 0.5 10*3/uL (ref 0.1–1.0)
Monocytes Relative: 6 %
Neutro Abs: 6 10*3/uL (ref 1.7–7.7)
Neutrophils Relative %: 65 %
Platelets: 292 10*3/uL (ref 150–400)
RBC: 3.56 MIL/uL — ABNORMAL LOW (ref 4.22–5.81)
RDW: 13.7 % (ref 11.5–15.5)
WBC: 9.3 10*3/uL (ref 4.0–10.5)
nRBC: 0 % (ref 0.0–0.2)

## 2020-10-25 LAB — BASIC METABOLIC PANEL
Anion gap: 6 (ref 5–15)
BUN: 15 mg/dL (ref 8–23)
CO2: 25 mmol/L (ref 22–32)
Calcium: 8.8 mg/dL — ABNORMAL LOW (ref 8.9–10.3)
Chloride: 108 mmol/L (ref 98–111)
Creatinine, Ser: 1.61 mg/dL — ABNORMAL HIGH (ref 0.61–1.24)
GFR, Estimated: 48 mL/min — ABNORMAL LOW (ref >60)
Glucose, Bld: 95 mg/dL (ref 70–99)
Potassium: 3.6 mmol/L (ref 3.5–5.1)
Sodium: 139 mmol/L (ref 135–145)

## 2020-10-25 LAB — BRAIN NATRIURETIC PEPTIDE: B Natriuretic Peptide: 77.9 pg/mL (ref 0.0–100.0)

## 2020-10-25 MED ORDER — DULERA 100-5 MCG/ACT IN AERO: 2.0000 | INHALATION_SPRAY | Freq: Every morning | RESPIRATORY_TRACT | 2 refills | Status: DC

## 2020-10-25 MED ORDER — CARVEDILOL 6.25 MG PO TABS: 6.2500 mg | ORAL_TABLET | Freq: Two times a day (BID) | ORAL | 0 refills | Status: DC

## 2020-10-25 MED ORDER — ALBUTEROL SULFATE (2.5 MG/3ML) 0.083% IN NEBU: 2.5000 mg | INHALATION_SOLUTION | Freq: Four times a day (QID) | RESPIRATORY_TRACT | 12 refills | Status: DC | PRN

## 2020-10-25 MED ORDER — METHYLPREDNISOLONE SODIUM SUCC 125 MG IJ SOLR
60.0000 mg | Freq: Once | INTRAMUSCULAR | Status: AC
  Administered 2020-10-25: 60 mg via INTRAMUSCULAR

## 2020-10-25 MED ORDER — CLONIDINE HCL 0.1 MG PO TABS
0.1000 mg | ORAL_TABLET | Freq: Once | ORAL | Status: AC
  Administered 2020-10-25: 0.1 mg via ORAL

## 2020-10-25 MED ORDER — AMLODIPINE BESYLATE 5 MG PO TABS: 5.0000 mg | ORAL_TABLET | Freq: Every day | ORAL | 2 refills | Status: DC

## 2020-10-25 MED ORDER — ALBUTEROL SULFATE HFA 108 (90 BASE) MCG/ACT IN AERS
2.0000 | INHALATION_SPRAY | Freq: Once | RESPIRATORY_TRACT | Status: AC
  Administered 2020-10-25: 2 via RESPIRATORY_TRACT

## 2020-10-25 MED ORDER — METHYLPREDNISOLONE SODIUM SUCC 125 MG IJ SOLR
INTRAMUSCULAR | Status: AC
  Filled 2020-10-25: qty 2

## 2020-10-25 MED ORDER — DOXYCYCLINE HYCLATE 100 MG PO CAPS: 100.0000 mg | ORAL_CAPSULE | Freq: Two times a day (BID) | ORAL | 0 refills | Status: AC

## 2020-10-25 MED ORDER — HYDRALAZINE HCL 50 MG PO TABS: 50.0000 mg | ORAL_TABLET | Freq: Two times a day (BID) | ORAL | 0 refills | Status: DC

## 2020-10-25 MED ORDER — ALBUTEROL SULFATE HFA 108 (90 BASE) MCG/ACT IN AERS
INHALATION_SPRAY | RESPIRATORY_TRACT | Status: AC
  Filled 2020-10-25: qty 6.7

## 2020-10-25 MED ORDER — CLONIDINE HCL 0.1 MG PO TABS
ORAL_TABLET | ORAL | Status: AC
  Filled 2020-10-25: qty 1

## 2020-10-25 MED ORDER — PREDNISONE 20 MG PO TABS: 40.0000 mg | ORAL_TABLET | Freq: Every day | ORAL | 0 refills | Status: AC

## 2020-10-25 MED ORDER — BIKTARVY 50-200-25 MG PO TABS: 1.0000 | ORAL_TABLET | Freq: Every day | ORAL | 11 refills | Status: DC

## 2020-10-25 MED ORDER — GUAIFENESIN ER 600 MG PO TB12: 600.0000 mg | ORAL_TABLET | Freq: Two times a day (BID) | ORAL | 0 refills | Status: AC

## 2020-10-25 NOTE — ED Triage Notes (Signed)
Pt reports for a week had a cough. Repots worse when laying down at night. Reports feels like can't get air. Pt used to have inhalers but not taking any medications now.

## 2020-10-25 NOTE — Discharge Instructions (Addendum)
Use medications as prescribed Use albuterol nebulization every 4-6 hours as needed Chest x-ray is consistent with COPD Please take your medications regularly Go to the emergency room if symptoms worsen.

## 2020-10-25 NOTE — ED Triage Notes (Signed)
Pt reports that since Covid came he hasnt taken any medications or seen a PCP.

## 2020-10-25 NOTE — ED Provider Notes (Signed)
[facility]    CSN: [id number] Arrival date & time: 10/25/20  0805      History   Chief Complaint Chief Complaint  Patient presents with   Cough    HPI Kevin [name] is a 65 y.o. male comes to urgent care with 1 week history of nonproductive cough, shortness of breath and not able to take a full breath.  Patient's symptoms started insidiously and has been persistent.  No fever or chills.  Patient has a cough which is occasionally productive of frothy sputum.  Shortness of breath is worse at night especially when he is laying down.  No lower extremity swelling.  No palpitations.  He has generalized chest pain associated with coughing.  No nausea, vomiting or diarrhea.  No sick contacts.  Patient has a 40-pack-year history of smoking.   HPI  Past Medical History:  Diagnosis Date   AIDS (acquired immune deficiency syndrome) ([place]) 08/17/2016   Chronic diastolic CHF (congestive heart failure), NYHA class 3 (HCC) 01/2016   Chronic lower back pain    CKD (chronic kidney disease), stage III (HCC)    COPD (chronic obstructive pulmonary disease) ([place])    Gout    "forearms, hands, ankles, feet" (06/05/2016)   Headache    "weekly" (06/05/2016)   Heart murmur    Hypertension    Hypertensive crisis 08/15/2017   OSA on CPAP    PAD (peripheral artery disease) (HCC)    PAF (paroxysmal atrial fibrillation) ([place]) 01/2016    Patient Active Problem List   Diagnosis Date Noted   Dyspnea 04/04/2019   Callus of foot 07/10/2018   Hypertensive crisis 08/15/2017   Coronary artery disease involving native coronary artery of native heart with unstable angina pectoris ([place]) 10/19/2016   Easy bruising 08/11/2016   Claudication in peripheral vascular disease ([place]) 06/05/2016   Stage 3 chronic kidney disease ([place])    Peripheral arterial disease ([place]) 05/09/2016   Acute on chronic diastolic CHF (congestive heart failure), NYHA class 3 ([place]) 03/24/2016   OSA (obstructive sleep apnea)  03/24/2016   Essential hypertension 02/18/2016   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   Shortness of breath 02/07/2016   PAF (paroxysmal atrial fibrillation) ([place]) 02/07/2016   Tobacco abuse 02/07/2016   Acute on chronic renal insufficiency 02/07/2016   Chronic obstructive pulmonary disease with acute exacerbation ([place]) 02/07/2016   Hypertensive emergency 02/07/2016   HIV disease ([place]) 08/27/2004    Past Surgical History:  Procedure Laterality Date   LEFT HEART CATH AND CORONARY ANGIOGRAPHY N/A 10/23/2016   Procedure: LEFT HEART CATH AND CORONARY ANGIOGRAPHY;  Surgeon: Leonie Man, MD;  Location: [facility];  Service: Cardiovascular;  Laterality: N/A;   LOWER EXTREMITY ANGIOGRAPHY N/A 07/17/2016   Procedure: Lower Extremity Angiography;  Surgeon: Lorretta Harp, MD;  Location: [facility];  Service: Cardiovascular;  Laterality: N/A;   LOWER EXTREMITY INTERVENTION N/A 06/05/2016   Procedure: Lower Extremity Intervention;  Surgeon: Lorretta Harp, MD;  Location: [facility];  Service: Cardiovascular;  Laterality: N/A;   PERIPHERAL VASCULAR ATHERECTOMY Right 07/17/2016   Procedure: Peripheral Vascular Atherectomy;  Surgeon: Lorretta Harp, MD;  Location: [facility];  Service: Cardiovascular;  Laterality: Right;  SFA   PERIPHERAL VASCULAR INTERVENTION  06/05/2016   Procedure: Peripheral Vascular Intervention;  Surgeon: Lorretta Harp, MD;  Location: [facility];  Service: Cardiovascular;;  left SFA       Home Medications    Prior to  Admission medications   Medication Sig Start Date End Date Taking? Authorizing Provider  doxycycline (VIBRAMYCIN) 100 MG capsule Take 1 capsule (100 mg total) by mouth 2 (two) times daily for 7 days. 10/25/20 11/01/20 Yes Glendale Youngblood, Myrene Galas, MD  guaiFENesin (MUCINEX) 600 MG 12 hr tablet Take 1 tablet (600 mg total) by mouth 2 (two) times daily for 15 days. 10/25/20 11/09/20 Yes Jhoana Upham, Myrene Galas, MD   predniSONE (DELTASONE) 20 MG tablet Take 2 tablets (40 mg total) by mouth daily for 5 days. 10/25/20 10/30/20 Yes Jadrien Narine, Myrene Galas, MD  albuterol (PROVENTIL) (2.5 MG/3ML) 0.083% nebulizer solution Take 3 mLs (2.5 mg total) by nebulization every 6 (six) hours as needed for wheezing or shortness of breath. 10/25/20   Chase Picket, MD  amLODipine (NORVASC) 5 MG tablet Take 1 tablet (5 mg total) by mouth daily. 10/25/20   Chase Picket, MD  aspirin EC 81 MG EC tablet Take 1 tablet (81 mg total) by mouth daily. 04/05/19   Cheryln Manly, NP  bictegravir-emtricitabine-tenofovir AF (BIKTARVY) 50-200-25 MG TABS tablet Take 1 tablet by mouth daily. 10/25/20   Brodie Correll, Myrene Galas, MD  carvedilol (COREG) 6.25 MG tablet Take 1 tablet (6.25 mg total) by mouth 2 (two) times daily with a meal. 10/25/20   Kindal Ponti, Myrene Galas, MD  hydrALAZINE (APRESOLINE) 50 MG tablet Take 1 tablet (50 mg total) by mouth 2 (two) times daily. 10/25/20   Chase Picket, MD  mometasone-formoterol (DULERA) 100-5 MCG/ACT AERO Inhale 2 puffs into the lungs every morning. 10/25/20   Samanthamarie Ezzell, Myrene Galas, MD  pregabalin (LYRICA) 150 MG capsule Take 150 mg by mouth 2 (two) times daily. 11/07/18   [provider]  traZODone (DESYREL) 100 MG tablet Take 100 mg by mouth at bedtime. 11/07/18   [provider]    Family History Family History  Problem Relation Age of Onset   High blood pressure Mother    Lupus Mother     Social History Social History   Tobacco Use   Smoking status: Every Day    Packs/day: 0.25    Years: 42.00    Pack years: 10.50    Types: Cigarettes   Smokeless tobacco: Never   Tobacco comments:    4 a day  Vaping Use   Vaping Use: Never used  Substance Use Topics   Alcohol use: Yes    Alcohol/week: 2.0 standard drinks    Types: 2 Cans of beer per week   Drug use: No     Allergies   Patient has no known allergies.   Review of Systems Review of Systems  Constitutional: Negative.    HENT:  Negative for ear discharge, ear pain and sore throat.   Respiratory:  Positive for cough, chest tightness and shortness of breath. Negative for wheezing.   Cardiovascular:  Positive for chest pain.  Gastrointestinal: Negative.   Musculoskeletal: Negative.   Neurological: Negative.     Physical Exam Triage Vital Signs ED Triage Vitals  Enc Vitals Group     BP 10/25/20 0834 (!) 231/122     Pulse Rate 10/25/20 0834 65     Resp 10/25/20 0834 20     Temp 10/25/20 0835 98.4 F (36.9 C)     Temp Source 10/25/20 0835 Oral     SpO2 10/25/20 0834 95 %     Weight --      Height --      Head Circumference --      Peak  Flow --      Pain Score 10/25/20 0830 8     Pain Loc --      Pain Edu? --      Excl. in [place]? --    No data found.  Updated Vital Signs BP (!) 237/131 (BP Location: Left Arm)   Pulse 65   Temp 98.4 F (36.9 C) (Oral)   Resp 20   SpO2 95%   Visual Acuity Right Eye Distance:   Left Eye Distance:   Bilateral Distance:    Right Eye Near:   Left Eye Near:    Bilateral Near:     Physical Exam Vitals and nursing note reviewed.  Constitutional:      General: He is not in acute distress.    Appearance: He is not ill-appearing.  HENT:     Right Ear: Tympanic membrane normal.     Left Ear: Tympanic membrane normal.  Cardiovascular:     Rate and Rhythm: Normal rate and regular rhythm.     Pulses: Normal pulses.     Heart sounds: Normal heart sounds.  Pulmonary:     Effort: Respiratory distress present.     Breath sounds: No stridor. No wheezing, rhonchi or rales.  Chest:     Chest wall: No tenderness.  Abdominal:     General: Bowel sounds are normal.     Palpations: Abdomen is soft.  Neurological:     General: No focal deficit present.     Mental Status: He is alert and oriented to person, place, and time.     UC Treatments / Results  Labs (all labs ordered are listed, but only abnormal results are displayed) Labs Reviewed  CBC WITH  DIFFERENTIAL/PLATELET - Abnormal; Notable for the following components:      Result Value   RBC 3.56 (*)    Hemoglobin 10.9 (*)    HCT 33.0 (*)    Eosinophils Absolute 0.7 (*)    All other components within normal limits  BASIC METABOLIC PANEL - Abnormal; Notable for the following components:   Creatinine, Ser 1.61 (*)    Calcium 8.8 (*)    GFR, Estimated 48 (*)    All other components within normal limits  BRAIN NATRIURETIC PEPTIDE    EKG   Radiology DG Chest 2 View  Result Date: 10/25/2020 CLINICAL DATA:  Shortness of breath. EXAM: CHEST - 2 VIEW COMPARISON:  04/04/2019 FINDINGS: Lungs are hyperexpanded. The lungs are clear without focal pneumonia, edema, pneumothorax or pleural effusion. The cardiopericardial silhouette is within normal limits for size. The visualized bony structures of the thorax show no acute abnormality. IMPRESSION: No active cardiopulmonary disease. Electronically Signed   By: Misty Stanley M.D.   On: 10/25/2020 09:02    Procedures Procedures (including critical care time)  Medications Ordered in UC Medications  methylPREDNISolone sodium succinate (SOLU-MEDROL) 125 mg/2 mL injection 60 mg (60 mg Intramuscular Given 10/25/20 0859)  cloNIDine (CATAPRES) tablet 0.1 mg (0.1 mg Oral Given 10/25/20 0858)  albuterol (VENTOLIN HFA) 108 (90 Base) MCG/ACT inhaler 2 puff (2 puffs Inhalation Given 10/25/20 0858)    Initial Impression / Assessment and Plan / UC Course  I have reviewed the triage vital signs and the nursing notes.  Pertinent labs & imaging results that were available during my care of the patient were reviewed by me and considered in my medical decision making (see chart for details).     1.  COPD with acute exacerbation: Chest x-ray shows hyperinflated lungs with  no acute lung infiltrate Solu-Medrol 60 mg IM x1 dose Albuterol inhaler, albuterol nebulizer solutions Doxycycline twice daily x7 days Mucinex 600 mg twice daily Prednisone 40 mg orally  daily for 5 days.  2.  Hypertensive urgency of multifactorial etiology: Amlodipine, hydralazine have been refilled Go to emergency room if symptoms worsen. Final Clinical Impressions(s) / UC Diagnoses   Final diagnoses:  COPD with acute exacerbation ([place])  HIV disease ([place])     Discharge Instructions      Use medications as prescribed Use albuterol nebulization every 4-6 hours as needed Chest x-ray is consistent with COPD Please take your medications regularly Go to the emergency room if symptoms worsen.   ED Prescriptions     Medication Sig Dispense Auth. Provider   albuterol (PROVENTIL) (2.5 MG/3ML) 0.083% nebulizer solution Take 3 mLs (2.5 mg total) by nebulization every 6 (six) hours as needed for wheezing or shortness of breath. 75 mL Aristotelis Vilardi, Myrene Galas, MD   amLODipine (NORVASC) 5 MG tablet Take 1 tablet (5 mg total) by mouth daily. 30 tablet Taylor Spilde, Myrene Galas, MD   mometasone-formoterol (DULERA) 100-5 MCG/ACT AERO Inhale 2 puffs into the lungs every morning. 13 g Jerrald Doverspike, Myrene Galas, MD   hydrALAZINE (APRESOLINE) 50 MG tablet Take 1 tablet (50 mg total) by mouth 2 (two) times daily. 60 tablet Alicia Ackert, Myrene Galas, MD   bictegravir-emtricitabine-tenofovir AF (BIKTARVY) 50-200-25 MG TABS tablet Take 1 tablet by mouth daily. 30 tablet Malene Blaydes, Myrene Galas, MD   carvedilol (COREG) 6.25 MG tablet Take 1 tablet (6.25 mg total) by mouth 2 (two) times daily with a meal. 60 tablet Alexsus Papadopoulos, Myrene Galas, MD   predniSONE (DELTASONE) 20 MG tablet Take 2 tablets (40 mg total) by mouth daily for 5 days. 10 tablet Dakhari Zuver, Myrene Galas, MD   guaiFENesin (MUCINEX) 600 MG 12 hr tablet Take 1 tablet (600 mg total) by mouth 2 (two) times daily for 15 days. 30 tablet Felder Lebeda, Myrene Galas, MD   doxycycline (VIBRAMYCIN) 100 MG capsule Take 1 capsule (100 mg total) by mouth 2 (two) times daily for 7 days. 14 capsule Lanson Randle, Myrene Galas, MD      PDMP not reviewed this encounter.   Chase Picket, MD 10/25/20  [phone]

## 2020-11-01 NOTE — Progress Notes (Signed)
Synopsis: Referred for COPD by Gifford Shave, MD  Subjective:   PATIENT ID: Kevin [name] GENDER: male DOB: 1955-09-02, MRN: [id number]  Chief Complaint  Patient presents with   Consult    Self referral for COPD. Was in the ED last week for a COPD exacerbation. He was placed on a prednisone taper which he finished yesterday. Increased fatigue and SOB.    82yM with HIV/AIDS followed by ID (acknowledges missing a few doses of biktarvy here and there), pAF, CHF, PAD, ~40 py smoking, CKD3 who is referred following ED visit for presumed AECOPD.  He says for about 2 weeks he had been waking up with trouble breathing, weakness, cough. Given doxy, prednisone and inhalers (dulera, albuterol). CXR there showed only hyperexpansion. Has had no fever but he has had weight loss since last winter (20 lb), no night sweats.   These are the first inhalers that he has tried. He rinses his mouth out after using dulera. Doing 2 puffs each morning or with activity. He is unsure whether or not prednisone was truly helpful.   Otherwise pertinent review of systems is negative.  No family history of lung disease although he says his sister is on oxygen.   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 Doesn't wear mask when cooking. Very rare MJ. No vaping. He has 4 dogs, rabbit. No pet birds or hot tubs. Never has lived outside of [place] for extended period.  Past Medical History:  Diagnosis Date   AIDS (acquired immune deficiency syndrome) ([place]) 08/17/2016   Chronic diastolic CHF (congestive heart failure), NYHA class 3 (HCC) 01/2016   Chronic lower back pain    CKD (chronic kidney disease), stage III (HCC)    COPD (chronic obstructive pulmonary disease) (HCC)    Gout    "forearms, hands, ankles, feet" (06/05/2016)   Headache    "weekly" (06/05/2016)   Heart murmur    Hypertension    Hypertensive crisis 08/15/2017   OSA on CPAP    PAD (peripheral artery disease) (HCC)    PAF (paroxysmal atrial  fibrillation) ([place]) 01/2016     Family History  Problem Relation Age of Onset   High blood pressure Mother    Lupus Mother      Past Surgical History:  Procedure Laterality Date   LEFT HEART CATH AND CORONARY ANGIOGRAPHY N/A 10/23/2016   Procedure: LEFT HEART CATH AND CORONARY ANGIOGRAPHY;  Surgeon: Leonie Man, MD;  Location: [facility];  Service: Cardiovascular;  Laterality: N/A;   LOWER EXTREMITY ANGIOGRAPHY N/A 07/17/2016   Procedure: Lower Extremity Angiography;  Surgeon: Lorretta Harp, MD;  Location: [facility];  Service: Cardiovascular;  Laterality: N/A;   LOWER EXTREMITY INTERVENTION N/A 06/05/2016   Procedure: Lower Extremity Intervention;  Surgeon: Lorretta Harp, MD;  Location: [facility];  Service: Cardiovascular;  Laterality: N/A;   PERIPHERAL VASCULAR ATHERECTOMY Right 07/17/2016   Procedure: Peripheral Vascular Atherectomy;  Surgeon: Lorretta Harp, MD;  Location: [facility];  Service: Cardiovascular;  Laterality: Right;  SFA   PERIPHERAL VASCULAR INTERVENTION  06/05/2016   Procedure: Peripheral Vascular Intervention;  Surgeon: Lorretta Harp, MD;  Location: [facility];  Service: Cardiovascular;;  left SFA    Social History   Socioeconomic History   Marital status: Divorced    Spouse name: Not on file   Number of children: Not on file   Years of education: Not on file  Highest education level: Not on file  Occupational History   Not on file  Tobacco Use   Smoking status: Every Day    Packs/day: 0.25    Years: 42.00    Pack years: 10.50    Types: Cigarettes   Smokeless tobacco: Never   Tobacco comments:    4 a day  Vaping Use   Vaping Use: Never used  Substance and Sexual Activity   Alcohol use: Yes    Alcohol/week: 2.0 standard drinks    Types: 2 Cans of beer per week   Drug use: No   Sexual activity: Not Currently    Comment: offered condoms  Other Topics Concern   Not on file  Social History  Narrative   Not on file   Social Determinants of Health   Financial Resource Strain: Not on file  Food Insecurity: Not on file  Transportation Needs: Not on file  Physical Activity: Not on file  Stress: Not on file  Social Connections: Not on file  Intimate Partner Violence: Not on file     No Known Allergies   Outpatient Medications Prior to Visit  Medication Sig Dispense Refill   albuterol (PROVENTIL) (2.5 MG/3ML) 0.083% nebulizer solution Take 3 mLs (2.5 mg total) by nebulization every 6 (six) hours as needed for wheezing or shortness of breath. 75 mL 12   amLODipine (NORVASC) 5 MG tablet Take 1 tablet (5 mg total) by mouth daily. 30 tablet 2   aspirin EC 81 MG EC tablet Take 1 tablet (81 mg total) by mouth daily. 90 tablet 0   bictegravir-emtricitabine-tenofovir AF (BIKTARVY) 50-200-25 MG TABS tablet Take 1 tablet by mouth daily. 30 tablet 11   carvedilol (COREG) 6.25 MG tablet Take 1 tablet (6.25 mg total) by mouth 2 (two) times daily with a meal. 60 tablet 0   guaiFENesin (MUCINEX) 600 MG 12 hr tablet Take 1 tablet (600 mg total) by mouth 2 (two) times daily for 15 days. 30 tablet 0   hydrALAZINE (APRESOLINE) 50 MG tablet Take 1 tablet (50 mg total) by mouth 2 (two) times daily. 60 tablet 0   mometasone-formoterol (DULERA) 100-5 MCG/ACT AERO Inhale 2 puffs into the lungs every morning. 13 g 2   pregabalin (LYRICA) 150 MG capsule Take 150 mg by mouth 2 (two) times daily.     traZODone (DESYREL) 100 MG tablet Take 100 mg by mouth at bedtime.     No facility-administered medications prior to visit.       Objective:   Physical Exam:  General appearance: 65 y.o., male, NAD, conversant, thin and chronically ill appearing Eyes: anicteric sclerae, moist conjunctivae; no lid-lag; PERRL, tracking appropriately HENT: NCAT; oropharynx, MMM, no mucosal ulcerations; normal hard and soft palate Neck: Trachea midline; no lymphadenopathy, no JVD Lungs: Diminished bilaterally, no  crackles, no wheeze, with normal respiratory effort CV: bradycardic, RR, no MRGs  Abdomen: Soft, non-tender; non-distended, BS present  Extremities: trace edema, warm Skin: Normal temperature, turgor and texture; no rash Psych: Appropriate affect Neuro: Alert and oriented to person and place, no focal deficit    Vitals:   11/02/20 1004  BP: (!) 180/92  Pulse: (!) 59  SpO2: 100%  Weight: 154 lb 3.2 oz (69.9 kg)  Height: 5\' 10"  (1.778 m)   100% on RA BMI Readings from Last 3 Encounters:  11/02/20 22.13 kg/m  06/10/19 25.83 kg/m  06/04/19 22.38 kg/m   Wt Readings from Last 3 Encounters:  11/02/20 154 lb 3.2 oz (69.9 kg)  06/10/19 180 lb (81.6 kg)  06/04/19 165 lb (74.8 kg)     CBC    Component Value Date/Time   WBC 9.3 10/25/2020 0843   RBC 3.56 (L) 10/25/2020 0843   HGB 10.9 (L) 10/25/2020 0843   HGB 12.6 (L) 07/10/2018 1103   HCT 33.0 (L) 10/25/2020 0843   HCT 38.1 07/10/2018 1103   PLT 292 10/25/2020 0843   PLT 276 07/10/2018 1103   MCV 92.7 10/25/2020 0843   MCV 95 07/10/2018 1103   MCH 30.6 10/25/2020 0843   MCHC 33.0 10/25/2020 0843   RDW 13.7 10/25/2020 0843   RDW 13.2 07/10/2018 1103   LYMPHSABS 2.0 10/25/2020 0843   LYMPHSABS 1.5 07/10/2018 1103   MONOABS 0.5 10/25/2020 0843   EOSABS 0.7 (H) 10/25/2020 0843   EOSABS 0.0 07/10/2018 1103   BASOSABS 0.0 10/25/2020 0843   BASOSABS 0.0 07/10/2018 1103   Eos 700 in ED  Recent CD4 counts over last 4 years all >700  Did have detectable HIV viral load 1 year ago  Chest Imaging:  CXR 10/25/20 reviewed by me and remarkable for hyperinflation  Pulmonary Functions Testing Results: No flowsheet data found.    Echocardiogram:   TTE 03/2019 with G1DD      Assessment & Plan:   # Dyspnea on exertion: # Weakness: At risk for COPD and at risk for rapid progression given underlying HIV. Did have Eosinophils of 700 at ED visit. Would also consider PJP, or other HIV associated ILD (although subacute pace  would be odd). Otherwise consider diastolic dysfunction, uncontrolled HIV, deconditioning.  # Smoking  Plan: - CT Chest - PFTs in a month - TSH, CK given concomitant weakness (had BMP, BNP last week already) - dulera 2 puffs BID for now - instructed to hold for 2 days before PFTs though - discuss lung cancer screening next visit - smoking cessation strongly encouraged     Maryjane Hurter, MD [facility] 11/02/2020 10:23 AM

## 2020-11-02 ENCOUNTER — Ambulatory Visit (INDEPENDENT_AMBULATORY_CARE_PROVIDER_SITE_OTHER): Payer: Medicaid Other | Admitting: Student

## 2020-11-02 ENCOUNTER — Encounter: Payer: Self-pay | Admitting: Student

## 2020-11-02 ENCOUNTER — Other Ambulatory Visit: Payer: Self-pay

## 2020-11-02 VITALS — BP 180/92 | HR 59 | Ht 70.0 in | Wt 154.2 lb

## 2020-11-02 DIAGNOSIS — R531 Weakness: Secondary | ICD-10-CM

## 2020-11-02 DIAGNOSIS — F17200 Nicotine dependence, unspecified, uncomplicated: Secondary | ICD-10-CM

## 2020-11-02 DIAGNOSIS — R0600 Dyspnea, unspecified: Secondary | ICD-10-CM

## 2020-11-02 DIAGNOSIS — R0609 Other forms of dyspnea: Secondary | ICD-10-CM

## 2020-11-02 LAB — TSH: TSH: 1.17 u[IU]/mL (ref 0.35–5.50)

## 2020-11-02 LAB — CK: Total CK: 61 U/L (ref 7–232)

## 2020-11-02 NOTE — Addendum Note (Signed)
Addended by: Suzzanne Cloud E on: 11/02/2020 10:45 AM   Modules accepted: Orders

## 2020-11-02 NOTE — Patient Instructions (Signed)
-   CT Chest - We will schedule breathing tests - labs today - I'll be in touch after testing above  - can increase dulera to 2 puffs twice daily, rinse mouth after use - stop dulera for 2 days before your breathing tests

## 2020-11-15 ENCOUNTER — Ambulatory Visit (HOSPITAL_COMMUNITY)
Admission: RE | Admit: 2020-11-15 | Discharge: 2020-11-15 | Disposition: A | Discharge status: Home / Self Care | Payer: Medicaid Other | Source: Ambulatory Visit | Attending: Student | Admitting: Student

## 2020-11-15 ENCOUNTER — Other Ambulatory Visit: Payer: Self-pay

## 2020-11-15 DIAGNOSIS — R0609 Other forms of dyspnea: Secondary | ICD-10-CM | POA: Diagnosis not present

## 2020-11-22 ENCOUNTER — Telehealth: Payer: Self-pay | Admitting: Student

## 2020-11-22 DIAGNOSIS — R918 Other nonspecific abnormal finding of lung field: Secondary | ICD-10-CM

## 2020-11-22 NOTE — Telephone Encounter (Signed)
NM  please advise on the CT scan.  Thanks   Kevin [name] would like someone to go over CT restuls that came in 10/03. There is not an appointment until December and she would like for the doctor to call and go over the meaning of the results with them.

## 2020-11-23 MED ORDER — ALBUTEROL SULFATE (2.5 MG/3ML) 0.083% IN NEBU: 2.5000 mg | INHALATION_SOLUTION | Freq: Four times a day (QID) | RESPIRATORY_TRACT | 12 refills | Status: DC | PRN

## 2020-11-23 NOTE — Telephone Encounter (Signed)
Called and discussed CT Chest results. I don't think it explains his worsened dyspnea, cough. There are some nodules and recommend getting 3 month surveillance CT Chest. He asked if we could try to make sure to schedule his PFTs soon. I said I'd make sure that he has albuterol refills available in meantime.  [place]

## 2020-11-23 NOTE — Addendum Note (Signed)
Addended by: Maryjane Hurter on: 11/23/2020 01:18 PM   Modules accepted: Orders

## 2020-11-26 NOTE — Telephone Encounter (Signed)
Called patient to schedule PFT 2 times, patient answered then hung up. Sent patient a mychart message to call the office to schedule a breathing test.

## 2020-11-30 ENCOUNTER — Inpatient Hospital Stay (HOSPITAL_COMMUNITY)
Admission: EM | Admit: 2020-11-30 | Discharge: 2020-12-03 | DRG: 190 | Disposition: A | Discharge status: Home / Self Care | Payer: Medicaid Other | Attending: Family Medicine | Admitting: Family Medicine

## 2020-11-30 ENCOUNTER — Other Ambulatory Visit: Payer: Self-pay

## 2020-11-30 ENCOUNTER — Emergency Department (HOSPITAL_COMMUNITY): Payer: Medicaid Other

## 2020-11-30 ENCOUNTER — Encounter (HOSPITAL_COMMUNITY): Payer: Self-pay | Admitting: Emergency Medicine

## 2020-11-30 DIAGNOSIS — N1832 Chronic kidney disease, stage 3b: Secondary | ICD-10-CM | POA: Diagnosis present

## 2020-11-30 DIAGNOSIS — Z79899 Other long term (current) drug therapy: Secondary | ICD-10-CM | POA: Diagnosis not present

## 2020-11-30 DIAGNOSIS — N289 Disorder of kidney and ureter, unspecified: Secondary | ICD-10-CM | POA: Diagnosis not present

## 2020-11-30 DIAGNOSIS — Z7982 Long term (current) use of aspirin: Secondary | ICD-10-CM

## 2020-11-30 DIAGNOSIS — J439 Emphysema, unspecified: Secondary | ICD-10-CM | POA: Diagnosis present

## 2020-11-30 DIAGNOSIS — Z7951 Long term (current) use of inhaled steroids: Secondary | ICD-10-CM | POA: Diagnosis not present

## 2020-11-30 DIAGNOSIS — J441 Chronic obstructive pulmonary disease with (acute) exacerbation: Secondary | ICD-10-CM

## 2020-11-30 DIAGNOSIS — E781 Pure hyperglyceridemia: Secondary | ICD-10-CM | POA: Diagnosis present

## 2020-11-30 DIAGNOSIS — Z20822 Contact with and (suspected) exposure to covid-19: Secondary | ICD-10-CM | POA: Diagnosis present

## 2020-11-30 DIAGNOSIS — I2510 Atherosclerotic heart disease of native coronary artery without angina pectoris: Secondary | ICD-10-CM | POA: Diagnosis present

## 2020-11-30 DIAGNOSIS — J9601 Acute respiratory failure with hypoxia: Secondary | ICD-10-CM | POA: Diagnosis present

## 2020-11-30 DIAGNOSIS — N189 Chronic kidney disease, unspecified: Secondary | ICD-10-CM | POA: Diagnosis not present

## 2020-11-30 DIAGNOSIS — J9600 Acute respiratory failure, unspecified whether with hypoxia or hypercapnia: Secondary | ICD-10-CM

## 2020-11-30 DIAGNOSIS — F17210 Nicotine dependence, cigarettes, uncomplicated: Secondary | ICD-10-CM | POA: Diagnosis present

## 2020-11-30 DIAGNOSIS — N179 Acute kidney failure, unspecified: Secondary | ICD-10-CM | POA: Diagnosis present

## 2020-11-30 DIAGNOSIS — G4733 Obstructive sleep apnea (adult) (pediatric): Secondary | ICD-10-CM | POA: Diagnosis present

## 2020-11-30 DIAGNOSIS — I169 Hypertensive crisis, unspecified: Secondary | ICD-10-CM | POA: Diagnosis present

## 2020-11-30 DIAGNOSIS — I160 Hypertensive urgency: Secondary | ICD-10-CM | POA: Diagnosis present

## 2020-11-30 DIAGNOSIS — M109 Gout, unspecified: Secondary | ICD-10-CM | POA: Diagnosis present

## 2020-11-30 DIAGNOSIS — I480 Paroxysmal atrial fibrillation: Secondary | ICD-10-CM | POA: Diagnosis present

## 2020-11-30 DIAGNOSIS — B20 Human immunodeficiency virus [HIV] disease: Secondary | ICD-10-CM | POA: Diagnosis present

## 2020-11-30 DIAGNOSIS — I130 Hypertensive heart and chronic kidney disease with heart failure and stage 1 through stage 4 chronic kidney disease, or unspecified chronic kidney disease: Secondary | ICD-10-CM | POA: Diagnosis present

## 2020-11-30 DIAGNOSIS — I161 Hypertensive emergency: Secondary | ICD-10-CM | POA: Diagnosis present

## 2020-11-30 DIAGNOSIS — I739 Peripheral vascular disease, unspecified: Secondary | ICD-10-CM | POA: Diagnosis present

## 2020-11-30 DIAGNOSIS — R0602 Shortness of breath: Secondary | ICD-10-CM

## 2020-11-30 DIAGNOSIS — I5032 Chronic diastolic (congestive) heart failure: Secondary | ICD-10-CM | POA: Diagnosis present

## 2020-11-30 DIAGNOSIS — F419 Anxiety disorder, unspecified: Secondary | ICD-10-CM | POA: Diagnosis present

## 2020-11-30 DIAGNOSIS — J4489 Other specified chronic obstructive pulmonary disease: Secondary | ICD-10-CM

## 2020-11-30 LAB — CBC WITH DIFFERENTIAL/PLATELET
Abs Immature Granulocytes: 0.04 10*3/uL (ref 0.00–0.07)
Basophils Absolute: 0 10*3/uL (ref 0.0–0.1)
Basophils Relative: 0 %
Eosinophils Absolute: 0.7 10*3/uL — ABNORMAL HIGH (ref 0.0–0.5)
Eosinophils Relative: 8 %
HCT: 33.6 % — ABNORMAL LOW (ref 39.0–52.0)
Hemoglobin: 11.4 g/dL — ABNORMAL LOW (ref 13.0–17.0)
Immature Granulocytes: 1 %
Lymphocytes Relative: 29 %
Lymphs Abs: 2.5 10*3/uL (ref 0.7–4.0)
MCH: 30.9 pg (ref 26.0–34.0)
MCHC: 33.9 g/dL (ref 30.0–36.0)
MCV: 91.1 fL (ref 80.0–100.0)
Monocytes Absolute: 0.5 10*3/uL (ref 0.1–1.0)
Monocytes Relative: 6 %
Neutro Abs: 4.8 10*3/uL (ref 1.7–7.7)
Neutrophils Relative %: 56 %
Platelets: 277 10*3/uL (ref 150–400)
RBC: 3.69 MIL/uL — ABNORMAL LOW (ref 4.22–5.81)
RDW: 14.2 % (ref 11.5–15.5)
WBC: 8.6 10*3/uL (ref 4.0–10.5)
nRBC: 0 % (ref 0.0–0.2)

## 2020-11-30 LAB — CREATININE, SERUM
Creatinine, Ser: 1.3 mg/dL — ABNORMAL HIGH (ref 0.61–1.24)
GFR, Estimated: >60 mL/min (ref >60)

## 2020-11-30 LAB — COMPREHENSIVE METABOLIC PANEL
ALT: 15 U/L (ref 0–44)
AST: 25 U/L (ref 15–41)
Albumin: 3.3 g/dL — ABNORMAL LOW (ref 3.5–5.0)
Alkaline Phosphatase: 75 U/L (ref 38–126)
Anion gap: 7 (ref 5–15)
BUN: 14 mg/dL (ref 8–23)
CO2: 27 mmol/L (ref 22–32)
Calcium: 8.8 mg/dL — ABNORMAL LOW (ref 8.9–10.3)
Chloride: 106 mmol/L (ref 98–111)
Creatinine, Ser: 1.52 mg/dL — ABNORMAL HIGH (ref 0.61–1.24)
GFR, Estimated: 51 mL/min — ABNORMAL LOW (ref >60)
Glucose, Bld: 90 mg/dL (ref 70–99)
Potassium: 3.5 mmol/L (ref 3.5–5.1)
Sodium: 140 mmol/L (ref 135–145)
Total Bilirubin: 0.6 mg/dL (ref 0.3–1.2)
Total Protein: 7.9 g/dL (ref 6.5–8.1)

## 2020-11-30 LAB — URINALYSIS, ROUTINE W REFLEX MICROSCOPIC
Bilirubin Urine: NEGATIVE
Glucose, UA: NEGATIVE mg/dL
Hgb urine dipstick: NEGATIVE
Ketones, ur: NEGATIVE mg/dL
Leukocytes,Ua: NEGATIVE
Nitrite: NEGATIVE
Protein, ur: NEGATIVE mg/dL
Specific Gravity, Urine: 1.005 (ref 1.005–1.030)
pH: 6 (ref 5.0–8.0)

## 2020-11-30 LAB — HEMOGLOBIN A1C
Hgb A1c MFr Bld: 4.9 % (ref 4.8–5.6)
Mean Plasma Glucose: 93.93 mg/dL

## 2020-11-30 LAB — RESP PANEL BY RT-PCR (FLU A&B, COVID) ARPGX2
Influenza A by PCR: NEGATIVE
Influenza B by PCR: NEGATIVE
SARS Coronavirus 2 by RT PCR: NEGATIVE

## 2020-11-30 LAB — CBC
HCT: 36.1 % — ABNORMAL LOW (ref 39.0–52.0)
Hemoglobin: 12.6 g/dL — ABNORMAL LOW (ref 13.0–17.0)
MCH: 31.1 pg (ref 26.0–34.0)
MCHC: 34.9 g/dL (ref 30.0–36.0)
MCV: 89.1 fL (ref 80.0–100.0)
Platelets: 294 10*3/uL (ref 150–400)
RBC: 4.05 MIL/uL — ABNORMAL LOW (ref 4.22–5.81)
RDW: 14.2 % (ref 11.5–15.5)
WBC: 11.4 10*3/uL — ABNORMAL HIGH (ref 4.0–10.5)
nRBC: 0 % (ref 0.0–0.2)

## 2020-11-30 LAB — MRSA NEXT GEN BY PCR, NASAL: MRSA by PCR Next Gen: NOT DETECTED

## 2020-11-30 LAB — STREP PNEUMONIAE URINARY ANTIGEN: Strep Pneumo Urinary Antigen: NEGATIVE

## 2020-11-30 LAB — BRAIN NATRIURETIC PEPTIDE: B Natriuretic Peptide: 118.1 pg/mL — ABNORMAL HIGH (ref 0.0–100.0)

## 2020-11-30 LAB — MAGNESIUM: Magnesium: 1.9 mg/dL (ref 1.7–2.4)

## 2020-11-30 LAB — GLUCOSE, CAPILLARY
Glucose-Capillary: 138 mg/dL — ABNORMAL HIGH (ref 70–99)
Glucose-Capillary: 145 mg/dL — ABNORMAL HIGH (ref 70–99)
Glucose-Capillary: 262 mg/dL — ABNORMAL HIGH (ref 70–99)
Glucose-Capillary: 312 mg/dL — ABNORMAL HIGH (ref 70–99)
Glucose-Capillary: 60 mg/dL — ABNORMAL LOW (ref 70–99)
Glucose-Capillary: 96 mg/dL (ref 70–99)

## 2020-11-30 LAB — TROPONIN I (HIGH SENSITIVITY)
Troponin I (High Sensitivity): 55 ng/L — ABNORMAL HIGH (ref <18)
Troponin I (High Sensitivity): 57 ng/L — ABNORMAL HIGH (ref <18)

## 2020-11-30 MED ORDER — METHYLPREDNISOLONE SODIUM SUCC 125 MG IJ SOLR: 80.0000 mg | Freq: Two times a day (BID) | INTRAMUSCULAR | Status: DC

## 2020-11-30 MED ORDER — HYDRALAZINE HCL 50 MG PO TABS
50.0000 mg | ORAL_TABLET | Freq: Two times a day (BID) | ORAL | Status: DC
  Administered 2020-11-30 – 2020-12-02 (×5): 50 mg via ORAL
  Filled 2020-11-30 (×2): qty 1
  Filled 2020-11-30: qty 2
  Filled 2020-11-30 (×3): qty 1

## 2020-11-30 MED ORDER — ARFORMOTEROL TARTRATE 15 MCG/2ML IN NEBU
15.0000 ug | INHALATION_SOLUTION | Freq: Two times a day (BID) | RESPIRATORY_TRACT | Status: DC
  Administered 2020-11-30 – 2020-12-03 (×7): 15 ug via RESPIRATORY_TRACT
  Filled 2020-11-30 (×6): qty 2

## 2020-11-30 MED ORDER — CLONAZEPAM 0.5 MG PO TABS
0.5000 mg | ORAL_TABLET | Freq: Two times a day (BID) | ORAL | Status: DC | PRN
  Administered 2020-11-30 – 2020-12-01 (×2): 0.5 mg via ORAL
  Filled 2020-11-30 (×2): qty 1

## 2020-11-30 MED ORDER — ALBUTEROL SULFATE (2.5 MG/3ML) 0.083% IN NEBU
5.0000 mg/h | INHALATION_SOLUTION | RESPIRATORY_TRACT | Status: DC
  Administered 2020-11-30: 5 mg/h via RESPIRATORY_TRACT
  Filled 2020-11-30: qty 6

## 2020-11-30 MED ORDER — HYDRALAZINE HCL 25 MG PO TABS
50.0000 mg | ORAL_TABLET | Freq: Once | ORAL | Status: AC
  Administered 2020-11-30: 50 mg via ORAL
  Filled 2020-11-30: qty 2

## 2020-11-30 MED ORDER — ASPIRIN EC 81 MG PO TBEC
81.0000 mg | DELAYED_RELEASE_TABLET | Freq: Every day | ORAL | Status: DC
  Administered 2020-11-30 – 2020-12-03 (×4): 81 mg via ORAL
  Filled 2020-11-30 (×4): qty 1

## 2020-11-30 MED ORDER — BICTEGRAVIR-EMTRICITAB-TENOFOV 50-200-25 MG PO TABS
1.0000 | ORAL_TABLET | Freq: Every day | ORAL | Status: DC
  Filled 2020-11-30: qty 1

## 2020-11-30 MED ORDER — METHYLPREDNISOLONE SODIUM SUCC 125 MG IJ SOLR
60.0000 mg | Freq: Two times a day (BID) | INTRAMUSCULAR | Status: AC
Start: 2020-11-30 — End: 2020-12-01
  Administered 2020-11-30 – 2020-12-01 (×4): 60 mg via INTRAVENOUS
  Filled 2020-11-30 (×4): qty 2

## 2020-11-30 MED ORDER — HEPARIN SODIUM (PORCINE) 5000 UNIT/ML IJ SOLN
5000.0000 [IU] | Freq: Three times a day (TID) | INTRAMUSCULAR | Status: DC
  Administered 2020-11-30 – 2020-12-03 (×10): 5000 [IU] via SUBCUTANEOUS
  Filled 2020-11-30 (×10): qty 1

## 2020-11-30 MED ORDER — METHYLPREDNISOLONE SODIUM SUCC 125 MG IJ SOLR
125.0000 mg | Freq: Once | INTRAMUSCULAR | Status: AC
  Administered 2020-11-30: 125 mg via INTRAVENOUS
  Filled 2020-11-30: qty 2

## 2020-11-30 MED ORDER — ACETAMINOPHEN 325 MG PO TABS: 650.0000 mg | ORAL_TABLET | ORAL | Status: DC | PRN

## 2020-11-30 MED ORDER — FLUTICASONE FUROATE-VILANTEROL 200-25 MCG/INH IN AEPB: 1.0000 | INHALATION_SPRAY | Freq: Every day | RESPIRATORY_TRACT | Status: DC

## 2020-11-30 MED ORDER — FUROSEMIDE 10 MG/ML IJ SOLN
40.0000 mg | Freq: Once | INTRAMUSCULAR | Status: AC
  Administered 2020-11-30: 40 mg via INTRAVENOUS
  Filled 2020-11-30: qty 4

## 2020-11-30 MED ORDER — AMLODIPINE BESYLATE 5 MG PO TABS
5.0000 mg | ORAL_TABLET | Freq: Once | ORAL | Status: AC
  Administered 2020-11-30: 5 mg via ORAL
  Filled 2020-11-30: qty 1

## 2020-11-30 MED ORDER — CHLORHEXIDINE GLUCONATE CLOTH 2 % EX PADS
6.0000 | MEDICATED_PAD | Freq: Every day | CUTANEOUS | Status: DC
  Administered 2020-11-30: 6 via TOPICAL

## 2020-11-30 MED ORDER — INSULIN ASPART 100 UNIT/ML IJ SOLN
0.0000 [IU] | INTRAMUSCULAR | Status: DC
  Administered 2020-11-30: 7 [IU] via SUBCUTANEOUS
  Administered 2020-12-01: 1 [IU] via SUBCUTANEOUS
  Administered 2020-12-01: 2 [IU] via SUBCUTANEOUS
  Administered 2020-12-01: 1 [IU] via SUBCUTANEOUS
  Administered 2020-12-01: 2 [IU] via SUBCUTANEOUS
  Administered 2020-12-01 (×2): 1 [IU] via SUBCUTANEOUS
  Administered 2020-12-02 (×3): 2 [IU] via SUBCUTANEOUS
  Administered 2020-12-03: 1 [IU] via SUBCUTANEOUS

## 2020-11-30 MED ORDER — INSULIN ASPART 100 UNIT/ML IJ SOLN
0.0000 [IU] | INTRAMUSCULAR | Status: DC
  Administered 2020-11-30: 8 [IU] via SUBCUTANEOUS

## 2020-11-30 MED ORDER — NITROGLYCERIN 2 % TD OINT
0.5000 [in_us] | TOPICAL_OINTMENT | Freq: Once | TRANSDERMAL | Status: AC
  Administered 2020-11-30: 0.5 [in_us] via TOPICAL
  Filled 2020-11-30: qty 1

## 2020-11-30 MED ORDER — CARVEDILOL 3.125 MG PO TABS: 6.2500 mg | ORAL_TABLET | Freq: Two times a day (BID) | ORAL | Status: DC

## 2020-11-30 MED ORDER — LABETALOL HCL 5 MG/ML IV SOLN
10.0000 mg | Freq: Once | INTRAVENOUS | Status: AC
  Administered 2020-11-30: 10 mg via INTRAVENOUS
  Filled 2020-11-30: qty 4

## 2020-11-30 MED ORDER — HYDRALAZINE HCL 20 MG/ML IJ SOLN
5.0000 mg | Freq: Three times a day (TID) | INTRAMUSCULAR | Status: DC | PRN
  Administered 2020-12-01: 5 mg via INTRAVENOUS
  Filled 2020-11-30: qty 1

## 2020-11-30 MED ORDER — BUDESONIDE 0.25 MG/2ML IN SUSP
0.2500 mg | Freq: Two times a day (BID) | RESPIRATORY_TRACT | Status: DC
  Administered 2020-11-30 – 2020-12-03 (×7): 0.25 mg via RESPIRATORY_TRACT
  Filled 2020-11-30 (×6): qty 2

## 2020-11-30 MED ORDER — CARVEDILOL 12.5 MG PO TABS
12.5000 mg | ORAL_TABLET | Freq: Once | ORAL | Status: DC
  Administered 2020-11-30: 6.25 mg via ORAL
  Filled 2020-11-30: qty 1

## 2020-11-30 MED ORDER — IPRATROPIUM-ALBUTEROL 0.5-2.5 (3) MG/3ML IN SOLN
3.0000 mL | Freq: Four times a day (QID) | RESPIRATORY_TRACT | Status: DC
  Administered 2020-11-30 (×2): 3 mL via RESPIRATORY_TRACT
  Filled 2020-11-30 (×2): qty 3

## 2020-11-30 MED ORDER — CARVEDILOL 12.5 MG PO TABS
12.5000 mg | ORAL_TABLET | Freq: Once | ORAL | Status: DC
  Filled 2020-11-30: qty 1

## 2020-11-30 MED ORDER — IPRATROPIUM-ALBUTEROL 0.5-2.5 (3) MG/3ML IN SOLN
RESPIRATORY_TRACT | Status: AC
  Filled 2020-11-30: qty 3

## 2020-11-30 MED ORDER — IPRATROPIUM BROMIDE 0.02 % IN SOLN
0.5000 mg | Freq: Once | RESPIRATORY_TRACT | Status: AC
  Administered 2020-11-30: 0.5 mg via RESPIRATORY_TRACT
  Filled 2020-11-30: qty 2.5

## 2020-11-30 MED ORDER — DOCUSATE SODIUM 100 MG PO CAPS: 100.0000 mg | ORAL_CAPSULE | Freq: Two times a day (BID) | ORAL | Status: DC | PRN

## 2020-11-30 MED ORDER — AMLODIPINE BESYLATE 5 MG PO TABS
5.0000 mg | ORAL_TABLET | Freq: Every day | ORAL | Status: DC
  Administered 2020-11-30 – 2020-12-03 (×4): 5 mg via ORAL
  Filled 2020-11-30 (×4): qty 1

## 2020-11-30 MED ORDER — POTASSIUM CHLORIDE 20 MEQ PO PACK
20.0000 meq | PACK | Freq: Once | ORAL | Status: AC
  Administered 2020-11-30: 20 meq via ORAL
  Filled 2020-11-30: qty 1

## 2020-11-30 MED ORDER — CARVEDILOL 6.25 MG PO TABS
6.2500 mg | ORAL_TABLET | Freq: Two times a day (BID) | ORAL | Status: DC
  Administered 2020-11-30 – 2020-12-03 (×8): 6.25 mg via ORAL
  Filled 2020-11-30 (×8): qty 1

## 2020-11-30 MED ORDER — BICTEGRAVIR-EMTRICITAB-TENOFOV 50-200-25 MG PO TABS
1.0000 | ORAL_TABLET | Freq: Every day | ORAL | Status: DC
  Administered 2020-11-30 – 2020-12-03 (×4): 1 via ORAL
  Filled 2020-11-30 (×4): qty 1

## 2020-11-30 MED ORDER — POLYETHYLENE GLYCOL 3350 17 G PO PACK: 17.0000 g | PACK | Freq: Every day | ORAL | Status: DC | PRN

## 2020-11-30 MED ORDER — ONDANSETRON HCL 4 MG/2ML IJ SOLN: 4.0000 mg | Freq: Four times a day (QID) | INTRAMUSCULAR | Status: DC | PRN

## 2020-11-30 MED ORDER — ALBUTEROL SULFATE (2.5 MG/3ML) 0.083% IN NEBU
2.5000 mg | INHALATION_SOLUTION | Freq: Once | RESPIRATORY_TRACT | Status: DC
  Filled 2020-11-30: qty 3

## 2020-11-30 NOTE — Progress Notes (Signed)
Call from CCM that patient would be ready to transfer out of unit tomorrow.  [facility] will pick up care of Kevin [name] 10/19 at 7am.    Carollee Leitz, MD [facility]

## 2020-11-30 NOTE — ED Notes (Addendum)
Hospitalist  MDs at bedside

## 2020-11-30 NOTE — ED Provider Notes (Addendum)
Emergency Medicine Provider Triage Evaluation Note  Kevin [name] , a 65 y.o. male  was evaluated in triage.  Pt complains of dyspnea for the past few weeks.  This seems to be progressively worsening.  He notes this mostly with exertion, also has trouble breathing in the supine position.  He notes a productive cough with white phlegm sputum and some chest tightness as well..  Review of Systems  Positive: Chest tightness, shortness of breath, cough Negative: Fever, vomiting, leg swelling  Physical Exam  BP (!) 230/129 (BP Location: Right Arm)   Pulse 62   Temp 98.9 F (37.2 C) (Oral)   Resp (!) 22   SpO2 97%  Gen:   Awake, no distress   Resp:  Normal effort  MSK:   Moves extremities without difficulty.  No significant peripheral edema. Other:  No obvious focal adventitious breath sounds.  Medical Decision Making  Medically screening exam initiated at 2:01 AM.  Appropriate orders placed.  Kevin [name] was informed that the remainder of the evaluation will be completed by another provider, this initial triage assessment does not replace that evaluation, and the importance of remaining in the ED until their evaluation is complete.  Patient hypertensive in triage, he has not had his home blood pressure medication, home meds ordered from triage.  Labs, EKG, cxr ordered  Dyspnea   Kevin Dyke, PA-C 11/30/20 0205    Kevin [name], Kevin Cornea, MD 11/30/20 0207  04:00: Patient with increased work of breathing, brought back to triage, SpO2 in the 80s with significant tachypnea, placed on 2L via [place]- escalated to 5L by nursing staff, patient with significant wheezing and diaphoresis at this time, will give duoneb and transfer patient to acute care room.     Kevin [name] 11/30/20 0408    Kevin Pew, MD 11/30/20 [phone]

## 2020-11-30 NOTE — ED Provider Notes (Signed)
[facility] EMERGENCY DEPARTMENT Provider Note  CSN: [id number] Arrival date & time: 11/30/20 0134  Chief Complaint(s) Shortness of Breath  HPI Kevin [name] is a 65 y.o. male with extensive past medical history listed below who presents to the emergency department with several weeks of gradually worsening shortness of breath.  Worse with exertion and lying supine.  He is endorsing productive cough with white sputum.  Also endorsing chest tightness from his shortness of breath.  Reports that he ran out of his inhalers several weeks ago due to financial constraints.  Still taking his other medications.  No other physical complaints  HPI  Past Medical History Past Medical History:  Diagnosis Date   AIDS (acquired immune deficiency syndrome) ([place]) 08/17/2016   Chronic diastolic CHF (congestive heart failure), NYHA class 3 (HCC) 01/2016   Chronic lower back pain    CKD (chronic kidney disease), stage III (HCC)    COPD (chronic obstructive pulmonary disease) ([place])    Gout    "forearms, hands, ankles, feet" (06/05/2016)   Headache    "weekly" (06/05/2016)   Heart murmur    Hypertension    Hypertensive crisis 08/15/2017   OSA on CPAP    PAD (peripheral artery disease) (HCC)    PAF (paroxysmal atrial fibrillation) ([place]) 01/2016   Patient Active Problem List   Diagnosis Date Noted   Dyspnea 04/04/2019   Callus of foot 07/10/2018   Hypertensive crisis 08/15/2017   Coronary artery disease involving native coronary artery of native heart with unstable angina pectoris ([place]) 10/19/2016   Easy bruising 08/11/2016   Claudication in peripheral vascular disease ([place]) 06/05/2016   Stage 3 chronic kidney disease ([place])    Peripheral arterial disease ([place]) 05/09/2016   Acute on chronic diastolic CHF (congestive heart failure), NYHA class 3 ([place]) 03/24/2016   OSA (obstructive sleep apnea) 03/24/2016   Essential hypertension 02/18/2016   Hypertensive cardiovascular disease  02/10/2016   Shortness of breath 02/07/2016   PAF (paroxysmal atrial fibrillation) ([place]) 02/07/2016   Tobacco abuse 02/07/2016   Acute on chronic renal insufficiency 02/07/2016   Chronic obstructive pulmonary disease with acute exacerbation ([place]) 02/07/2016   Hypertensive emergency 02/07/2016   HIV disease ([place]) 08/27/2004   Home Medication(s) Prior to Admission medications   Medication Sig Start Date End Date Taking? Authorizing Provider  albuterol (PROVENTIL) (2.5 MG/3ML) 0.083% nebulizer solution Take 3 mLs (2.5 mg total) by nebulization every 6 (six) hours as needed for wheezing or shortness of breath. 11/23/20   Maryjane Hurter, MD  amLODipine (NORVASC) 5 MG tablet Take 1 tablet (5 mg total) by mouth daily. 10/25/20   Chase Picket, MD  aspirin EC 81 MG EC tablet Take 1 tablet (81 mg total) by mouth daily. 04/05/19   Cheryln Manly, NP  bictegravir-emtricitabine-tenofovir AF (BIKTARVY) 50-200-25 MG TABS tablet Take 1 tablet by mouth daily. 10/25/20   Lamptey, Myrene Galas, MD  carvedilol (COREG) 6.25 MG tablet Take 1 tablet (6.25 mg total) by mouth 2 (two) times daily with a meal. 10/25/20   Lamptey, Myrene Galas, MD  hydrALAZINE (APRESOLINE) 50 MG tablet Take 1 tablet (50 mg total) by mouth 2 (two) times daily. 10/25/20   Chase Picket, MD  mometasone-formoterol (DULERA) 100-5 MCG/ACT AERO Inhale 2 puffs into the lungs every morning. 10/25/20   Lamptey, Myrene Galas, MD  pregabalin (LYRICA) 150 MG capsule Take 150 mg by mouth 2 (two) times daily. 11/07/18   [provider]  traZODone (DESYREL) 100 MG  tablet Take 100 mg by mouth at bedtime. 11/07/18   [provider]                                                                                                                                    Past Surgical History Past Surgical History:  Procedure Laterality Date   LEFT HEART CATH AND CORONARY ANGIOGRAPHY N/A 10/23/2016   Procedure: LEFT HEART CATH AND CORONARY  ANGIOGRAPHY;  Surgeon: Leonie Man, MD;  Location: [facility];  Service: Cardiovascular;  Laterality: N/A;   LOWER EXTREMITY ANGIOGRAPHY N/A 07/17/2016   Procedure: Lower Extremity Angiography;  Surgeon: Lorretta Harp, MD;  Location: [facility];  Service: Cardiovascular;  Laterality: N/A;   LOWER EXTREMITY INTERVENTION N/A 06/05/2016   Procedure: Lower Extremity Intervention;  Surgeon: Lorretta Harp, MD;  Location: [facility];  Service: Cardiovascular;  Laterality: N/A;   PERIPHERAL VASCULAR ATHERECTOMY Right 07/17/2016   Procedure: Peripheral Vascular Atherectomy;  Surgeon: Lorretta Harp, MD;  Location: [facility];  Service: Cardiovascular;  Laterality: Right;  SFA   PERIPHERAL VASCULAR INTERVENTION  06/05/2016   Procedure: Peripheral Vascular Intervention;  Surgeon: Lorretta Harp, MD;  Location: [facility];  Service: Cardiovascular;;  left SFA   Family History Family History  Problem Relation Age of Onset   High blood pressure Mother    Lupus Mother     Social History Social History   Tobacco Use   Smoking status: Every Day    Packs/day: 0.25    Years: 42.00    Pack years: 10.50    Types: Cigarettes   Smokeless tobacco: Never   Tobacco comments:    4 a day  Vaping Use   Vaping Use: Never used  Substance Use Topics   Alcohol use: Yes    Alcohol/week: 2.0 standard drinks    Types: 2 Cans of beer per week   Drug use: No   Allergies Patient has no known allergies.  Review of Systems Review of Systems All other systems are reviewed and are negative for acute change except as noted in the HPI  Physical Exam Vital Signs  I have reviewed the triage vital signs BP (!) 187/112   Pulse 65   Temp 98.3 F (36.8 C) (Oral)   Resp 18   SpO2 100%   Physical Exam Vitals reviewed.  Constitutional:      General: He is not in acute distress.    Appearance: He is well-developed. He is not diaphoretic.  HENT:     Head:  Normocephalic and atraumatic.     Nose: Nose normal.  Eyes:     General: No scleral icterus.       Right eye: No discharge.        Left eye: No discharge.     Conjunctiva/sclera: Conjunctivae normal.     Pupils: Pupils are equal, round,  and reactive to light.  Cardiovascular:     Rate and Rhythm: Normal rate and regular rhythm.     Heart sounds: No murmur heard.   No friction rub. No gallop.  Pulmonary:     Effort: Tachypnea, accessory muscle usage and respiratory distress present.     Breath sounds: Normal breath sounds. Decreased air movement present. No stridor. No rales.  Abdominal:     General: There is no distension.     Palpations: Abdomen is soft.     Tenderness: There is no abdominal tenderness.  Musculoskeletal:        General: No tenderness.     Cervical back: Normal range of motion and neck supple.  Skin:    General: Skin is warm and dry.     Findings: No erythema or rash.  Neurological:     Mental Status: He is alert and oriented to person, place, and time.    ED Results and Treatments Labs (all labs ordered are listed, but only abnormal results are displayed) Labs Reviewed  COMPREHENSIVE METABOLIC PANEL - Abnormal; Notable for the following components:      Result Value   Creatinine, Ser 1.52 (*)    Calcium 8.8 (*)    Albumin 3.3 (*)    GFR, Estimated 51 (*)    All other components within normal limits  CBC WITH DIFFERENTIAL/PLATELET - Abnormal; Notable for the following components:   RBC 3.69 (*)    Hemoglobin 11.4 (*)    HCT 33.6 (*)    Eosinophils Absolute 0.7 (*)    All other components within normal limits  TROPONIN I (HIGH SENSITIVITY) - Abnormal; Notable for the following components:   Troponin I (High Sensitivity) 55 (*)    All other components within normal limits  TROPONIN I (HIGH SENSITIVITY) - Abnormal; Notable for the following components:   Troponin I (High Sensitivity) 57 (*)    All other components within normal limits  RESP PANEL BY  RT-PCR (FLU A&B, COVID) ARPGX2                                                                                                                         EKG  EKG Interpretation  Date/Time:  Tuesday November 30 2020 01:59:53 EDT Ventricular Rate:  62 PR Interval:  192 QRS Duration: 92 QT Interval:  422 QTC Calculation: 428 R Axis:   66 Text Interpretation: Normal sinus rhythm Moderate voltage criteria for LVH, may be normal variant ( Sokolow-Lyon , Cornell product ) Septal infarct , age undetermined ST &  T wave changes improved from prior Abnormal ECG Confirmed by Addison Lank [phone]) on 11/30/2020 4:52:47 AM       Radiology DG Chest 2 View  Result Date: 11/30/2020 CLINICAL DATA:  Dyspnea EXAM: CHEST - 2 VIEW COMPARISON:  10/25/2020. FINDINGS: The heart size and mediastinal contours are within normal limits. Both lungs are clear. No acute osseous abnormality. IMPRESSION: No active cardiopulmonary disease. Electronically Signed   By:  Merilyn Baba M.D.   On: 11/30/2020 02:28    Pertinent labs & imaging results that were available during my care of the patient were reviewed by me and considered in my medical decision making (see MDM for details).  Medications Ordered in ED Medications  carvedilol (COREG) tablet 12.5 mg (12.5 mg Oral Not Given 11/30/20 0405)  albuterol (PROVENTIL) (2.5 MG/3ML) 0.083% nebulizer solution (5 mg/hr Nebulization New Bag/Given 11/30/20 0504)  labetalol (NORMODYNE) injection 10 mg (has no administration in time range)  amLODipine (NORVASC) tablet 5 mg (5 mg Oral Given 11/30/20 0242)  hydrALAZINE (APRESOLINE) tablet 50 mg (50 mg Oral Given 11/30/20 0242)  ipratropium-albuterol (DUONEB) 0.5-2.5 (3) MG/3ML nebulizer solution (  Given 11/30/20 0406)  ipratropium (ATROVENT) nebulizer solution 0.5 mg (0.5 mg Nebulization Given 11/30/20 0503)  methylPREDNISolone sodium succinate (SOLU-MEDROL) 125 mg/2 mL injection 125 mg (125 mg Intravenous Given 11/30/20 0446)   nitroGLYCERIN (NITROGLYN) 2 % ointment 0.5 inch (0.5 inches Topical Given 11/30/20 0442)                                                                                                                                     Procedures .1-3 Lead EKG Interpretation Performed by: Fatima Blank, MD Authorized by: Fatima Blank, MD     Interpretation: normal     ECG rate:  78   ECG rate assessment: normal     Rhythm: sinus rhythm     Ectopy: none     Conduction: normal   .Critical Care Performed by: Fatima Blank, MD Authorized by: Fatima Blank, MD   Critical care provider statement:    Critical care time (minutes):  60   Critical care time was exclusive of:  Separately billable procedures and treating other patients   Critical care was necessary to treat or prevent imminent or life-threatening deterioration of the following conditions:  Respiratory failure and circulatory failure   Critical care was time spent personally by me on the following activities:  Development of treatment plan with patient or surrogate, discussions with consultants, evaluation of patient's response to treatment, examination of patient, obtaining history from patient or surrogate, review of old charts, re-evaluation of patient's condition, pulse oximetry, ordering and review of radiographic studies, ordering and review of laboratory studies and ordering and performing treatments and interventions   Care discussed with: admitting provider    (including critical care time)  Medical Decision Making / ED Course I have reviewed the nursing notes for this encounter and the patient's prior records (if available in EHR or on provided paperwork).  Kevin [name] was evaluated in Emergency Department on 11/30/2020 for the symptoms described in the history of present illness. He was evaluated in the context of the global COVID-19 pandemic, which necessitated consideration that the patient  might be at risk for infection with the SARS-CoV-2 virus that causes COVID-19. Institutional protocols and algorithms that pertain to  the evaluation of patients at risk for COVID-19 are in a state of rapid change based on information released by regulatory bodies including the CDC and federal and state organizations. These policies and algorithms were followed during the patient's care in the ED.     Several weeks of gradually worsening shortness of breath. Patient does not appear to be volume overloaded. He is hypertensive.  Decreased air movement.  No rales or rhonchi. Concerning for COPD exacerbation. Patient is hypoxic on room air and requiring supplemental oxygen. Placed on BiPAP. DuoNeb given. Solu-Medrol given.  Nitroglycerin paste applied to treat hypertensive crisis.  Doubt ACS, or PE.  Pertinent labs & imaging results that were available during my care of the patient were reviewed by me and considered in my medical decision making:  COVID-negative No leukocytosis or significant anemia. No electrolyte derangements. Stable renal function. Chest x-ray without evidence of pneumonia, pulmonary edema, pneumothorax.  Admitted to [facility]  Final Clinical Impression(s) / ED Diagnoses Final diagnoses:  COPD exacerbation ([place])  Hypertensive crisis     This chart was dictated using voice recognition software.  Despite best efforts to proofread,  errors can occur which can change the documentation meaning.    Fatima Blank, MD 11/30/20 [phone]

## 2020-11-30 NOTE — H&P (Addendum)
Kevin [name] Admission History and Physical Service Pager: [phone]  Patient name: Kevin [name] Medical record number: [id number] Date of birth: 11-30-1955 Age: 65 y.o. Gender: male  Primary Care Provider: Gifford Shave, MD Consultants: None  Code Status: Full    Preferred Emergency Contact: Daughter, Nima Kemppainen, [phone]  Chief Complaint: worsening shortness of breath  Assessment and Plan: Kevin [name] is a 65 y.o. male presenting with worsening dyspnea . PMH is significant for COPD, CHF, PAD, CKD, OSA, HIV, and PAF   Acute hypoxic respiratory failure Patient with complicated medical hx presents with worsening dyspnea for the past 2 weeks. He reports not being able to work more than 10 ft without taking a break. His breathing is worse at night when he lays supine or on his left or right side. His best position is sitting upright or when he's out side in the cold weather. He's currently on BIPAP and sating well. Patient reports chronic cough with tea spoon sized non-bloody white sputum. He acknowledges smoking for about 20 years and currently only smoke 1-2 sticks of cigarette daily. Patient reports that his doctor outpatient told him he had COPD, and emphysema. On exam patient has Clear breath sounds, no wheezing or crackles. His CXR was normal. Patient present is concerning for COPD exacerbation. -Admit to FPTS, Attending Dr. McDiarmid -Supplement O2 as needed, wean as tolerated -SPO2 saturation goals, 88-90 -Continue albuterol nebulizer 5 mg  -Strict Is/Os -PT/OT Eval -Daily Lab: BNP, CBC -Cardiorespiratory Monitoring    Hypertensive emergency BP was 220/134 and transiently 160/134. Was previously hospitalized for 3 months about 4 years ago due to elevated BP. States he on hydralazine and Coreg but did not take them today as he was focused on his breathing. His BP was elevated on arrival to the ED. He denies any headache or chest pains. Will  consider adding Labetalol to his medication regimen for BP.  There is low threshold to consult Ccm should patient BP remains high or worsen. He was on home medication of Amilodipine  5mg  daily, Coreg 6.25mg  daily BID, Hydralazine 50mg  BID. -Continue home mediucation of Amilodipine 5mg  daily -Continue Home medication of Coreg BID -Continue home medication of hydralazine BID -Continue routine Vitals   HIV Patient was diagnosed with positive HIV back in 2006. He's doesn't see an infectious disease doctor but reports being compliant with his medications. He is on home medication of Biktarvy 50-200-25mg  daily. Unclear whether patient still takes his medication. Will look to restart medication after med rec is completed. -CD3/CD4 LABS   Paroxysmal atrial fibrillation Stable, was diagnosed in 2017. Patient on admission has sinus rhythm. His EKG shows NSR with HR of 62. He is not on any home medication. -Continue home medication of Aspirin   Diastolic heart failure Last Echo in 04/04/19 showed EF of 55-60% with mild concentric left ventricular hypertophy. Patient on physical exam had no signs of volume overload. He had clear breathe sounds without crackle or rales, no JVD or lower extremity edema.   COPD  Obstructive sleep apnea  Follows with pulmonology, Dr Fuller [place]. Does not use CPAP.  He lives with his wife and say she doesn't complain about him snoring. He is supposed to be on home medication of albuterol and Dulera but reports he hasn't been taking them. Will look to restart medication after med rec is completed.  Peripheral artery disease  Last Lipid panel was 04/04/19. Hypertriglyceridemia and elevated VLDL.  He has a history of Superficial Femoral  stent placement.  His record showed he was on home medication of Rosuvastatin 20 mg daily, unable to verify if he still takes this medication.   CKD Creatinine on admission was 1.52 and his baseline is around 1.9 -Avoid Nephrotoxic agents -Daily AM  Lab: CMP  Tobacco use Smokes 1-2 cigarettes a day. Smoked for the past 20 years.    FEN/GI: Regular diets Prophylaxis: SCDs  Disposition: Progressive   History of Present Illness:  Kevin [name] is a 65 y.o. male presenting with worsening shortness of breath.   Patient reports he was cooking and started having difficulty breathing about 2 weeks ago. He said his breathing has gotten worse and he can't walk more than 52ft without taking a break to catch his breath. He has to bend down to catch his breathe before continuing walking. At night he finds it difficult to lay down supine or on his sides, His best position is sitting up in bed because he breaths better in that position. He reports smoking for 20 yrs, currently smoke 2 cigarettes a day. He endorses chronic cough with "a lot" of white sputum but no blood.Kevin [name]  He denies have any recent fever or chills. In addition, patients reports  myalgias in his calves that this is unchanged with ambulation.   Review Of Systems: Per HPI with the following additions:   Review of Systems  Constitutional:  Negative for chills and fever.  HENT:  Negative for congestion, rhinorrhea and sore throat.   Respiratory:  Positive for cough, shortness of breath and wheezing.   Cardiovascular:  Positive for palpitations. Negative for chest pain and leg swelling.  Gastrointestinal:  Negative for diarrhea, nausea and vomiting.  Musculoskeletal:  Positive for myalgias.  Neurological:  Positive for headaches.    Patient Active Problem List   Diagnosis Date Noted   Dyspnea 04/04/2019   Callus of foot 07/10/2018   Hypertensive crisis 08/15/2017   Coronary artery disease involving native coronary artery of native heart with unstable angina pectoris ([place]) 10/19/2016   Easy bruising 08/11/2016   Claudication in peripheral vascular disease ([place]) 06/05/2016   Stage 3 chronic kidney disease ([place])    Peripheral arterial disease ([place]) 05/09/2016   Acute on chronic  diastolic CHF (congestive heart failure), NYHA class 3 ([place]) 03/24/2016   OSA (obstructive sleep apnea) 03/24/2016   Essential hypertension 02/18/2016   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   Shortness of breath 02/07/2016   PAF (paroxysmal atrial fibrillation) ([place]) 02/07/2016   Tobacco abuse 02/07/2016   Acute on chronic renal insufficiency 02/07/2016   Chronic obstructive pulmonary disease with acute exacerbation ([place]) 02/07/2016   Hypertensive emergency 02/07/2016   HIV disease ([place]) 08/27/2004    Past Medical History: Past Medical History:  Diagnosis Date   AIDS (acquired immune deficiency syndrome) ([place]) 08/17/2016   Chronic diastolic CHF (congestive heart failure), NYHA class 3 ([place]) 01/2016   Chronic lower back pain    CKD (chronic kidney disease), stage III (HCC)    COPD (chronic obstructive pulmonary disease) ([place])    Gout    "forearms, hands, ankles, feet" (06/05/2016)   Headache    "weekly" (06/05/2016)   Heart murmur    Hypertension    Hypertensive crisis 08/15/2017   OSA on CPAP    PAD (peripheral artery disease) (HCC)    PAF (paroxysmal atrial fibrillation) ([place]) 01/2016    Past Surgical History: Past Surgical History:  Procedure Laterality Date   LEFT HEART CATH AND CORONARY ANGIOGRAPHY N/A 10/23/2016  Procedure: LEFT HEART CATH AND CORONARY ANGIOGRAPHY;  Surgeon: Leonie Man, MD;  Location: [facility];  Service: Cardiovascular;  Laterality: N/A;   LOWER EXTREMITY ANGIOGRAPHY N/A 07/17/2016   Procedure: Lower Extremity Angiography;  Surgeon: Lorretta Harp, MD;  Location: [facility];  Service: Cardiovascular;  Laterality: N/A;   LOWER EXTREMITY INTERVENTION N/A 06/05/2016   Procedure: Lower Extremity Intervention;  Surgeon: Lorretta Harp, MD;  Location: [facility];  Service: Cardiovascular;  Laterality: N/A;   PERIPHERAL VASCULAR ATHERECTOMY Right 07/17/2016   Procedure: Peripheral Vascular Atherectomy;  Surgeon: Lorretta Harp, MD;  Location: [facility];  Service: Cardiovascular;  Laterality: Right;  SFA   PERIPHERAL VASCULAR INTERVENTION  06/05/2016   Procedure: Peripheral Vascular Intervention;  Surgeon: Lorretta Harp, MD;  Location: [facility];  Service: Cardiovascular;;  left SFA    Social History: Social History   Tobacco Use   Smoking status: Every Day    Packs/day: 0.25    Years: 42.00    Pack years: 10.50    Types: Cigarettes   Smokeless tobacco: Never   Tobacco comments:    4 a day  Vaping Use   Vaping Use: Never used  Substance Use Topics   Alcohol use: Yes    Alcohol/week: 2.0 standard drinks    Types: 2 Cans of beer per week   Drug use: No   Additional social history:   Please also refer to relevant sections of EMR.  Family History: Family History  Problem Relation Age of Onset   High blood pressure Mother    Lupus Mother      Allergies and Medications: No Known Allergies No current facility-administered medications on file prior to encounter.   Current Outpatient Medications on File Prior to Encounter  Medication Sig Dispense Refill   albuterol (PROVENTIL) (2.5 MG/3ML) 0.083% nebulizer solution Take 3 mLs (2.5 mg total) by nebulization every 6 (six) hours as needed for wheezing or shortness of breath. 75 mL 12   amLODipine (NORVASC) 5 MG tablet Take 1 tablet (5 mg total) by mouth daily. 30 tablet 2   aspirin EC 81 MG EC tablet Take 1 tablet (81 mg total) by mouth daily. 90 tablet 0   bictegravir-emtricitabine-tenofovir AF (BIKTARVY) 50-200-25 MG TABS tablet Take 1 tablet by mouth daily. 30 tablet 11   carvedilol (COREG) 6.25 MG tablet Take 1 tablet (6.25 mg total) by mouth 2 (two) times daily with a meal. 60 tablet 0   hydrALAZINE (APRESOLINE) 50 MG tablet Take 1 tablet (50 mg total) by mouth 2 (two) times daily. 60 tablet 0   mometasone-formoterol (DULERA) 100-5 MCG/ACT AERO Inhale 2 puffs into the lungs every morning. 13 g 2   pregabalin (LYRICA)  150 MG capsule Take 150 mg by mouth 2 (two) times daily.     traZODone (DESYREL) 100 MG tablet Take 100 mg by mouth at bedtime.      Objective: BP (!) 210/107   Pulse 73   Temp 98.3 F (36.8 C) (Oral)   Resp 16   SpO2 100%  Exam: General: Sitting up, well appearing, NAD  HEENT: Moist mucus membrane, No lesions,  Cardiovascular: RRR, No murmurs, normal S1/S2 Respiratory: CTAB, No crackles, No wheezing, on BIPAP Gastrointestinal: Soft, no distension, no tenderness MSK: normal muscle tones, no LE edema  Derm: dry, warm, calus on bilateral plantar of foot  Neuro: Oriented x3, No focal deficit Psych: Good affect and mood  Labs and Imaging: CBC BMET  Recent Labs  Lab 11/30/20 0208  WBC 8.6  HGB 11.4*  HCT 33.6*  PLT 277   Recent Labs  Lab 11/30/20 0208  NA 140  K 3.5  CL 106  CO2 27  BUN 14  CREATININE 1.52*  GLUCOSE 90  CALCIUM 8.8*     EKG: Normal sinus rhythm with HR 62.  DG Chest 2 View  Result Date: 11/30/2020 CLINICAL DATA:  Dyspnea EXAM: CHEST - 2 VIEW COMPARISON:  10/25/2020. FINDINGS: The heart size and mediastinal contours are within normal limits. Both lungs are clear. No acute osseous abnormality. IMPRESSION: No active cardiopulmonary disease. Electronically Signed   By: Merilyn Baba M.D.   On: 11/30/2020 02:28     Alen Bleacher, MD 11/30/2020, 5:30 AM PGY-1, [facility] Upper-Level Resident Addendum   I have independently interviewed and examined the patient. I have discussed the above with the original author and agree with their documentation. My edits for correction/addition/clarification are in within the document. Please see also any attending notes.   Lyndee Hensen, DO PGY-3, [facility] 11/30/2020 8:54 AM  FPTS Service pager: [phone] (text pages welcome through [facility])

## 2020-11-30 NOTE — H&P (Addendum)
NAME:  Kevin [name], MRN:  [id number], DOB:  06/21/1955, LOS: 0 ADMISSION DATE:  11/30/2020, CONSULTATION DATE:  11/30/2020 REFERRING MD:  Adah Salvage CHIEF COMPLAINT:  Hypertensive crisis/ COPD exacerbation   History of Present Illness:  65 year old male current every day smoker with complex medical history including COPD ( Degree unknown), HIV, CHF, CKD stage III, hypertension , hx of hypertensive crisis Murmur, OSA on CPAP, PAD, and PAF. Presents to the ED 10/17 with c/o several weeks of  gradually worsening shortness of breath.  Worse with exertion and lying supine.  He is endorsing productive cough with white sputum.  Also endorsing chest tightness from his shortness of breath.  Reports that he ran out of his inhalers several weeks ago due to financial constraints. He had been started on [facility], after an initial consult with Dr. Verlee Monte ( Pulmonary) and had run out before his insurance could refill the medication. ? If he was overusing it, or using as a rescue inhaler vs maintenance . He was placed on BiPAP, sats were 100% , RR 15 when I examined patient. He remains air hungry despite oxygen saturations of > 96%. CXR was clear. CT chest recently done 11/15/2020 . ( See Below)   BP on admission was 200/140. Pt. States he did not take his antihypertensive meds 10/17. Patient has been treated with one time doses of his home dose Norvasc, Coreg, and Apresoline , Lobetalol 10 mg IV x 1, and NTG paste. BP has remained elevated ( 190/124) despite PO , IV and dermal interventions noted above. Patient is anxious which may be contributing. Of note, BP at pulmonary office 11/02/2020 was 180/92. ? Compliance with his antihypertensives.   Labs in the ED reviewed: Troponin  57 Covid negative WBC 8.6/ HGB 11.4/Eosinophils 0.7/platelets 277 T max: 98.9  Na 140/ K 3.5/ Creatinine   1.52  CXR 11/30/2020 The heart size and mediastinal contours are within normal limits. Both lungs are clear. No acute osseous  abnormality No active cardiopulmonary disease  CT Chest 11/15/2020 Early/subtle emphysema. No focal or active process in the left lung. Few nodular opacities in the right lung of varying appearance. There are 2 areas of sub solid density as described above, the larger measuring 7 mm, best appreciated on the sagittal views and marked with arrows. Non-contrast chest CT at 3-6 months is recommended. If nodules persist, subsequent management will be based upon the most suspicious nodule(s).   Pertinent  Medical History   Past Medical History:  Diagnosis Date   AIDS (acquired immune deficiency syndrome) ([place]) 08/17/2016   Chronic diastolic CHF (congestive heart failure), NYHA class 3 (HCC) 01/2016   Chronic lower back pain    CKD (chronic kidney disease), stage III (HCC)    COPD (chronic obstructive pulmonary disease) (HCC)    Gout    "forearms, hands, ankles, feet" (06/05/2016)   Headache    "weekly" (06/05/2016)   Heart murmur    Hypertension    Hypertensive crisis 08/15/2017   OSA on CPAP    PAD (peripheral artery disease) (HCC)    PAF (paroxysmal atrial fibrillation) ([place]) 01/2016     Significant Hospital Events: Including procedures, antibiotic start and stop dates in addition to other pertinent events   Admission 11/29/2020 Recent admission 10/25/20 for COPD exacerbation >> BP on that admission was 237/131  Interim History / Subjective:  Very anxious, states he is short of breath despite adequate oxygen saturations. States his chest is tight  Currently on 2 L  [place] with sats of 96%. BP remains 190/124  Objective   Blood pressure (!) 207/114, pulse 92, temperature 98.3 F (36.8 C), temperature source Oral, resp. rate (!) 23, SpO2 96 %.    FiO2 (%):  [40 %] 40 %  No intake or output data in the 24 hours ending 11/30/20 0848 There were no vitals filed for this visit.  Examination: General: Awake and alert, on [place] , anxious  HENT: NCAT, PERRLA, No JVD Lungs: Bilateral chest  excursion, Wheezing throughout, Diminished per bases Cardiovascular: S1, S2, RRR, ST per tele, No RMG Abdomen: Soft, NT, ND, BS + Extremities: No obvious deformities, No edema Neuro: Awake and alert, following commands, MAE x 4, A&O x 3 GU: Voiding per urinal, concentrated amber urine  Resolved Hospital Problem list     Assessment & Plan:  Hypertensive crisis Non-responsive to initial measures Suspect non-compliance with his medications  Plan Will admit to ICU to monitor and tighten BP control Will add cleviprex if needed  Continue home meds Amlodipine/ Coreg/ Apresoline , may need up titrating Prn apresoline for SBP > 170 sustained  Lasix 40 mg x 1 dose now Will need education about antihypertensives prior to discharge  COPD exacerbation / Dyspnea  Emphysema per CT Chest OSA on CPAP ( Unsure of compliance)  Plan Duonebs/ Pulmicort/ Brovana Nebs secheduled Prn albuterol nebs Will transition to Breo and Incruse prior to DC ( suspect he needs triple therapy) Will need teaching prior to discharge ( difference between maintenance and rescue meds) Will need a saturation walk prior to discharge BiPAP prn and mandatory at bedtime Continue Solumedrol 80 mg BID x 2 additional days, then transition to prednisone taper 10/20. CXR prn ABG prn somnolence  Has OP PFT's ordered Check Mag and replete as needed to maintain > 2  CKD III  Creatinine on admission 1.52, baseline is 1.9 Plan Trend BMET daily and prn Avoid nephrotoxic medications Replete electrolytes as needed  Diastolic HF Last echo 09/7562>> EF 33-29% Grade I diastolic dysfunction  Plan Check BNP Strict I&O Lasix 40 mg x 1 dose now Echo ordered  Tobacco Abuse  Current every day smoker>> at least a 21 pack year smoking history Abnormal CT Chest 10/2020 Plan Smoking cessation counseling Needs 3 month follow up CT Chest 01/2021>> Follow up nodules in smoker Qualifies for Lung Cancer Screening   HIV Diagnosed  2006, but not followed by ID On home Biktarvy, but unsure of compliance Increased risk of PJP or other HIV associated ILD  Plan Continue Biktarvy CD3/ CD4 pending Re-refer to ID upon d/c for follow up  Continued Pulmonary follow up ( PFT's scheduled 11/8, follow up 01/27/2021)  Will need hospital follow up with pulmonary upon discharge  PAF Currently in NSR per tele No maintenance medications  Plan Tele monitoring  Continue aspirin  PAD Hypertriglyceridemia and elevated VLDL.      He has a history of Superficial Femoral stent placement. Home medication of Rosuvastatin 20 mg daily,  ? Compliance  Plan Trend triglycerides in am  Add statin if elevated      Best Practice (right click and "Reselect all SmartList Selections" daily)   Diet/type: Heart smart, low Na, Low carb DVT prophylaxis: prophylactic heparin  GI prophylaxis: N/A Lines: N/A Foley:  N/A Code Status:  full code Last date of multidisciplinary goals of care discussion [Family updated at bedside in the Ed by Dr. Loanne Drilling and myself 11/30/2020]  Labs   CBC: Recent Labs  Lab 11/30/20 0208  WBC 8.6  NEUTROABS 4.8  HGB 11.4*  HCT 33.6*  MCV 91.1  PLT 329    Basic Metabolic Panel: Recent Labs  Lab 11/30/20 0208  NA 140  K 3.5  CL 106  CO2 27  GLUCOSE 90  BUN 14  CREATININE 1.52*  CALCIUM 8.8*   GFR: CrCl cannot be calculated (Unknown ideal weight.). Recent Labs  Lab 11/30/20 0208  WBC 8.6    Liver Function Tests: Recent Labs  Lab 11/30/20 0208  AST 25  ALT 15  ALKPHOS 75  BILITOT 0.6  PROT 7.9  ALBUMIN 3.3*   No results for input(s): LIPASE, AMYLASE in the last 168 hours. No results for input(s): AMMONIA in the last 168 hours.  ABG    Component Value Date/Time   TCO2 32 04/04/2019 0210     Coagulation Profile: No results for input(s): INR, PROTIME in the last 168 hours.  Cardiac Enzymes: No results for input(s): CKTOTAL, CKMB, CKMBINDEX, TROPONINI in the last 168  hours.  HbA1C: Hgb A1c MFr Bld  Date/Time Value Ref Range Status  04/04/2019 04:58 AM 5.4 4.8 - 5.6 % Final    Comment:    (NOTE) Pre diabetes:          5.7%-6.4% Diabetes:              >6.4% Glycemic control for   <7.0% adults with diabetes   07/10/2018 11:03 AM 5.0 4.8 - 5.6 % Final    Comment:             Prediabetes: 5.7 - 6.4          Diabetes: >6.4          Glycemic control for adults with diabetes: <7.0     CBG: No results for input(s): GLUCAP in the last 168 hours.  Review of Systems:   + For Dyspnea, chest tightness, wheezing , cough, moderate sputum production , headache, elevated BP  Past Medical History:  He,  has a past medical history of AIDS (acquired immune deficiency syndrome) ([place]) (08/17/2016), Chronic diastolic CHF (congestive heart failure), NYHA class 3 ([place]) (01/2016), Chronic lower back pain, CKD (chronic kidney disease), stage III ([place]), COPD (chronic obstructive pulmonary disease) ([place]), Gout, Headache, Heart murmur, Hypertension, Hypertensive crisis (08/15/2017), OSA on CPAP, PAD (peripheral artery disease) ([place]), and PAF (paroxysmal atrial fibrillation) ([place]) (01/2016).   Surgical History:   Past Surgical History:  Procedure Laterality Date   LEFT HEART CATH AND CORONARY ANGIOGRAPHY N/A 10/23/2016   Procedure: LEFT HEART CATH AND CORONARY ANGIOGRAPHY;  Surgeon: Leonie Man, MD;  Location: [facility];  Service: Cardiovascular;  Laterality: N/A;   LOWER EXTREMITY ANGIOGRAPHY N/A 07/17/2016   Procedure: Lower Extremity Angiography;  Surgeon: Lorretta Harp, MD;  Location: [facility];  Service: Cardiovascular;  Laterality: N/A;   LOWER EXTREMITY INTERVENTION N/A 06/05/2016   Procedure: Lower Extremity Intervention;  Surgeon: Lorretta Harp, MD;  Location: [facility];  Service: Cardiovascular;  Laterality: N/A;   PERIPHERAL VASCULAR ATHERECTOMY Right 07/17/2016   Procedure: Peripheral Vascular Atherectomy;  Surgeon: Lorretta Harp, MD;  Location: [facility];  Service: Cardiovascular;  Laterality: Right;  SFA   PERIPHERAL VASCULAR INTERVENTION  06/05/2016   Procedure: Peripheral Vascular Intervention;  Surgeon: Lorretta Harp, MD;  Location: [facility];  Service: Cardiovascular;;  left SFA     Social History:   reports that he has been smoking cigarettes. He has a 10.50  pack-year smoking history. He has never used smokeless tobacco. He reports current alcohol use of about 2.0 standard drinks per week. He reports that he does not use drugs.   Family History:  His family history includes High blood pressure in his mother; Lupus in his mother.   Allergies No Known Allergies   Home Medications  Prior to Admission medications   Medication Sig Start Date End Date Taking? Authorizing Provider  albuterol (PROVENTIL) (2.5 MG/3ML) 0.083% nebulizer solution Take 3 mLs (2.5 mg total) by nebulization every 6 (six) hours as needed for wheezing or shortness of breath. 11/23/20   Maryjane Hurter, MD  amLODipine (NORVASC) 5 MG tablet Take 1 tablet (5 mg total) by mouth daily. 10/25/20   Chase Picket, MD  aspirin EC 81 MG EC tablet Take 1 tablet (81 mg total) by mouth daily. 04/05/19   Cheryln Manly, NP  bictegravir-emtricitabine-tenofovir AF (BIKTARVY) 50-200-25 MG TABS tablet Take 1 tablet by mouth daily. 10/25/20   Lamptey, Myrene Galas, MD  carvedilol (COREG) 6.25 MG tablet Take 1 tablet (6.25 mg total) by mouth 2 (two) times daily with a meal. 10/25/20   Lamptey, Myrene Galas, MD  hydrALAZINE (APRESOLINE) 50 MG tablet Take 1 tablet (50 mg total) by mouth 2 (two) times daily. 10/25/20   Chase Picket, MD  mometasone-formoterol (DULERA) 100-5 MCG/ACT AERO Inhale 2 puffs into the lungs every morning. 10/25/20   Lamptey, Myrene Galas, MD  pregabalin (LYRICA) 150 MG capsule Take 150 mg by mouth 2 (two) times daily. 11/07/18   [provider]  traZODone (DESYREL) 100 MG tablet Take 100 mg by mouth at  bedtime. 11/07/18   [provider]     Critical care time: 47 minutes    Magdalen Spatz, MSN, AGACNP-BC [place] for personal pager PCCM on call pager [phone]  11/30/2020 10:04 AM

## 2020-11-30 NOTE — ED Notes (Signed)
Pt came into triage very SOB, pt has trouble se

## 2020-11-30 NOTE — Progress Notes (Signed)
Receiving unit RN unavailable to take report at this time

## 2020-11-30 NOTE — ED Triage Notes (Signed)
Pt states "I just can't get a breath".  Hypertensive in triage, did not take his medication today.

## 2020-11-30 NOTE — ED Notes (Signed)
Bi-Pap removed by RT. Sarah NP at bedside.

## 2020-12-01 ENCOUNTER — Inpatient Hospital Stay (HOSPITAL_COMMUNITY): Payer: Medicaid Other

## 2020-12-01 DIAGNOSIS — I5032 Chronic diastolic (congestive) heart failure: Secondary | ICD-10-CM | POA: Diagnosis not present

## 2020-12-01 DIAGNOSIS — I169 Hypertensive crisis, unspecified: Secondary | ICD-10-CM | POA: Diagnosis not present

## 2020-12-01 DIAGNOSIS — R0602 Shortness of breath: Secondary | ICD-10-CM | POA: Diagnosis not present

## 2020-12-01 DIAGNOSIS — J9601 Acute respiratory failure with hypoxia: Secondary | ICD-10-CM | POA: Diagnosis not present

## 2020-12-01 LAB — ECHOCARDIOGRAM COMPLETE
Area-P 1/2: 4.11 cm2
Calc EF: 51.5 %
Height: 72 in
S' Lateral: 3 cm
Single Plane A2C EF: 49.7 %
Single Plane A4C EF: 57.5 %
Weight: 2267.2 oz

## 2020-12-01 LAB — GLUCOSE, CAPILLARY
Glucose-Capillary: 183 mg/dL — ABNORMAL HIGH (ref 70–99)
Glucose-Capillary: 175 mg/dL — ABNORMAL HIGH (ref 70–99)
Glucose-Capillary: 136 mg/dL — ABNORMAL HIGH (ref 70–99)
Glucose-Capillary: 136 mg/dL — ABNORMAL HIGH (ref 70–99)
Glucose-Capillary: 136 mg/dL — ABNORMAL HIGH (ref 70–99)

## 2020-12-01 LAB — BASIC METABOLIC PANEL
Anion gap: 9 (ref 5–15)
Anion gap: 8 (ref 5–15)
BUN: 31 mg/dL — ABNORMAL HIGH (ref 8–23)
BUN: 36 mg/dL — ABNORMAL HIGH (ref 8–23)
CO2: 21 mmol/L — ABNORMAL LOW (ref 22–32)
CO2: 22 mmol/L (ref 22–32)
Calcium: 8.9 mg/dL (ref 8.9–10.3)
Calcium: 9.1 mg/dL (ref 8.9–10.3)
Chloride: 100 mmol/L (ref 98–111)
Chloride: 105 mmol/L (ref 98–111)
Creatinine, Ser: 2.2 mg/dL — ABNORMAL HIGH (ref 0.61–1.24)
Creatinine, Ser: 2.29 mg/dL — ABNORMAL HIGH (ref 0.61–1.24)
GFR, Estimated: 33 mL/min — ABNORMAL LOW (ref >60)
GFR, Estimated: 31 mL/min — ABNORMAL LOW (ref >60)
Glucose, Bld: 116 mg/dL — ABNORMAL HIGH (ref 70–99)
Glucose, Bld: 149 mg/dL — ABNORMAL HIGH (ref 70–99)
Potassium: 3.4 mmol/L — ABNORMAL LOW (ref 3.5–5.1)
Potassium: 4.3 mmol/L (ref 3.5–5.1)
Sodium: 130 mmol/L — ABNORMAL LOW (ref 135–145)
Sodium: 135 mmol/L (ref 135–145)

## 2020-12-01 LAB — TRIGLYCERIDES: Triglycerides: 53 mg/dL (ref <150)

## 2020-12-01 LAB — CBC
HCT: 37.6 % — ABNORMAL LOW (ref 39.0–52.0)
Hemoglobin: 13 g/dL (ref 13.0–17.0)
MCH: 30.6 pg (ref 26.0–34.0)
MCHC: 34.6 g/dL (ref 30.0–36.0)
MCV: 88.5 fL (ref 80.0–100.0)
Platelets: 323 10*3/uL (ref 150–400)
RBC: 4.25 MIL/uL (ref 4.22–5.81)
RDW: 14.4 % (ref 11.5–15.5)
WBC: 14.6 10*3/uL — ABNORMAL HIGH (ref 4.0–10.5)
nRBC: 0 % (ref 0.0–0.2)

## 2020-12-01 LAB — T-HELPER CELLS (CD4) COUNT (NOT AT ARMC)
CD4 % Helper T Cell: 44 % (ref 33–65)
CD4 T Cell Abs: 429 /uL (ref 400–1790)

## 2020-12-01 LAB — MAGNESIUM: Magnesium: 2 mg/dL (ref 1.7–2.4)

## 2020-12-01 MED ORDER — IPRATROPIUM-ALBUTEROL 0.5-2.5 (3) MG/3ML IN SOLN
3.0000 mL | Freq: Three times a day (TID) | RESPIRATORY_TRACT | Status: DC
  Administered 2020-12-01: 3 mL via RESPIRATORY_TRACT
  Filled 2020-12-01: qty 3

## 2020-12-01 MED ORDER — IPRATROPIUM-ALBUTEROL 0.5-2.5 (3) MG/3ML IN SOLN
3.0000 mL | Freq: Two times a day (BID) | RESPIRATORY_TRACT | Status: DC
  Administered 2020-12-01 – 2020-12-03 (×4): 3 mL via RESPIRATORY_TRACT
  Filled 2020-12-01 (×4): qty 3

## 2020-12-01 MED ORDER — ALBUTEROL SULFATE (2.5 MG/3ML) 0.083% IN NEBU: 2.5000 mg | INHALATION_SOLUTION | Freq: Four times a day (QID) | RESPIRATORY_TRACT | Status: DC | PRN

## 2020-12-01 MED ORDER — POTASSIUM CHLORIDE 20 MEQ PO PACK
20.0000 meq | PACK | Freq: Once | ORAL | Status: AC
  Administered 2020-12-01: 20 meq via ORAL
  Filled 2020-12-01: qty 1

## 2020-12-01 MED ORDER — HYDROXYZINE HCL 25 MG PO TABS: 25.0000 mg | ORAL_TABLET | Freq: Two times a day (BID) | ORAL | Status: DC | PRN

## 2020-12-01 NOTE — Progress Notes (Signed)
SATURATION QUALIFICATIONS: (This note is used to comply with regulatory documentation for home oxygen)  Patient Saturations on Room Air at Rest = 94%  Patient Saturations on Room Air while Ambulating = 87%  Patient Saturations on 2 Liters of oxygen while Ambulating = 92%   

## 2020-12-01 NOTE — Progress Notes (Signed)
Family Medicine Teaching Service Daily Progress Note Intern Pager: [phone]  Patient name: Kevin [name] Medical record number: [id number] Date of birth: [birth year] Age: 65 y.o. Gender: male  Primary Care Provider: Gifford Shave, MD Consultants: CCM Code Status: Full  Pt Overview and Major Events to Date:  11/30/2020-admitted 12/01/2020-transfer from CCM to progressive  Assessment and Plan: Kevin [name] is a 65 y.o. male presenting with worsening dyspnea . PMH is significant for COPD, CHF, PAD, CKD, OSA, HIV, and PAF  Acute hypoxic respiratory failure secondary to COPD exacerbation  OSA Patient had worsening dyspnea for 2 weeks. Home medications include albuterol and Dulera but patient reported on admission that he had not been taking these medications. Chest x-ray yesterday and today shows no acute cardiopulmonary disease.  Patient required BiPAP, scheduled bronchodilators with triple therapy, and IV steroids while in care of CCM.  Patient should use BiPAP nightly and as needed.  Patient states his BiPAP was not working when he woke up this morning but he denies any shortness of breath.  Patient follows with Dr. Verlee Monte at [facility] pulmonology and has a follow-up visit scheduled for PFTs. This morning, pt was on 1.5 L [place] with O2 saturations in the low 90's with normal WOB and pt denies SOB. --Duo nebs 3 mL 3 times daily -Brovana 15 mcg twice daily -Pulmicort 0.25 mg twice daily -BiPAP nightly and as needed -Solu-Medrol 60 mg twice daily to end on 12/02/2020 (started on 11/30/20)  Hypertensive emergency BP was 220/134 on admission.  Home medications include hydralazine, amlodipine, and Coreg but he had not taken his medications the morning of admission.  Patient was treated with IV labetalol once, IV Lasix 40 mg once, NTG paste and restarted on home medications under CCM. BP this morning of 175/92. -Amlodipine 5 mg daily -Coreg 6.25 mg twice daily with meals -Hydralazine 50 mg twice  daily   Diastolic heart failure Echo from 04/04/2019 showed EF of 55-60%.  Echo scheduled for today. patient currently has no signs of volume overload.  BNP from 11/30/2020 of 118. -f/u on echo   Paroxysmal A. fib Stable.  Does not take any home medication.   PAD History of superficial femoral stent placement.  Chart review shows he was on rosuvastatin 20 mg daily. -Aspirin 81 mg daily  CKD stage III Cr 2.2 this morning, up from 1.3 yesterday. -Recheck BMP this afternoon -Monitor UOP and creatinine -Avoid nephrotoxic agents  Anxiety Pt received Klonopin 0.5 TID prn yesterday -d/c klonopin -start atarax prn   HIV -Biktarvy 1 tab daily  Tobacco use Has smoked for the past 20 years but currently only smokes 1 to 2 cigarettes a day FEN/GI: Regular diet PPx: SCDs Dispo:Home today or tomorrow  Barriers include repeat afternoon BMP to check creatinine.   Subjective:  Patient was resting well.  States his BiPAP was not working when he woke up this morning but that he is not experiencing any shortness of breath.  Has not had a bowel movement since he has been here but states he does not want any MiraLAX because he does not wish to use the bedpan  Objective: Temp:  [97.5 F (36.4 C)-98.6 F (37 C)] 98.4 F (36.9 C) (10/19 0132) Pulse Rate:  [64-98] 65 (10/19 0425) Resp:  [12-23] 18 (10/19 0422) BP: (129-207)/(72-158) 152/107 (10/19 0425) SpO2:  [93 %-100 %] 98 % (10/19 0425) Weight:  [64.3 kg] 64.3 kg (10/19 0132) Physical Exam: General: Patient lying in bed, sleeping, NAD Cardiovascular: RRR Respiratory:  Wheezes at bilateral lung bases Abdomen: Normal bowel sounds, soft, nontender to palpation, nondistended Extremities: No edema of BLEs  Laboratory: Recent Labs  Lab 11/30/20 0208 11/30/20 0927 12/01/20 0249  WBC 8.6 11.4* 14.6*  HGB 11.4* 12.6* 13.0  HCT 33.6* 36.1* 37.6*  PLT 277 294 323   Recent Labs  Lab 11/30/20 0208 11/30/20 0927 12/01/20 0249  NA  140  --  130*  K 3.5  --  3.4*  CL 106  --  100  CO2 27  --  21*  BUN 14  --  31*  CREATININE 1.52* 1.30* 2.20*  CALCIUM 8.8*  --  8.9  PROT 7.9  --   --   BILITOT 0.6  --   --   ALKPHOS 75  --   --   ALT 15  --   --   AST 25  --   --   GLUCOSE 90  --  116Precious Gilding, DO 12/01/2020, 5:33 AM PGY-1, [place] Intern pager: [phone], text pages welcome

## 2020-12-02 ENCOUNTER — Inpatient Hospital Stay (HOSPITAL_COMMUNITY): Payer: Medicaid Other

## 2020-12-02 DIAGNOSIS — N289 Disorder of kidney and ureter, unspecified: Secondary | ICD-10-CM | POA: Diagnosis not present

## 2020-12-02 DIAGNOSIS — N189 Chronic kidney disease, unspecified: Secondary | ICD-10-CM

## 2020-12-02 DIAGNOSIS — J441 Chronic obstructive pulmonary disease with (acute) exacerbation: Secondary | ICD-10-CM | POA: Diagnosis not present

## 2020-12-02 DIAGNOSIS — N179 Acute kidney failure, unspecified: Secondary | ICD-10-CM

## 2020-12-02 DIAGNOSIS — I169 Hypertensive crisis, unspecified: Secondary | ICD-10-CM | POA: Diagnosis not present

## 2020-12-02 LAB — GLUCOSE, CAPILLARY
Glucose-Capillary: 114 mg/dL — ABNORMAL HIGH (ref 70–99)
Glucose-Capillary: 118 mg/dL — ABNORMAL HIGH (ref 70–99)
Glucose-Capillary: 151 mg/dL — ABNORMAL HIGH (ref 70–99)
Glucose-Capillary: 158 mg/dL — ABNORMAL HIGH (ref 70–99)
Glucose-Capillary: 176 mg/dL — ABNORMAL HIGH (ref 70–99)
Glucose-Capillary: 181 mg/dL — ABNORMAL HIGH (ref 70–99)

## 2020-12-02 LAB — HIV-1 RNA QUANT-NO REFLEX-BLD
HIV 1 RNA Quant: <20 copies/mL
LOG10 HIV-1 RNA: UNDETERMINED log10copy/mL

## 2020-12-02 LAB — BASIC METABOLIC PANEL
Anion gap: 7 (ref 5–15)
BUN: 41 mg/dL — ABNORMAL HIGH (ref 8–23)
CO2: 23 mmol/L (ref 22–32)
Calcium: 8.7 mg/dL — ABNORMAL LOW (ref 8.9–10.3)
Chloride: 105 mmol/L (ref 98–111)
Creatinine, Ser: 2.38 mg/dL — ABNORMAL HIGH (ref 0.61–1.24)
GFR, Estimated: 30 mL/min — ABNORMAL LOW (ref >60)
Glucose, Bld: 122 mg/dL — ABNORMAL HIGH (ref 70–99)
Potassium: 4 mmol/L (ref 3.5–5.1)
Sodium: 135 mmol/L (ref 135–145)

## 2020-12-02 LAB — LEGIONELLA PNEUMOPHILA SEROGP 1 UR AG: L. pneumophila Serogp 1 Ur Ag: NEGATIVE

## 2020-12-02 MED ORDER — PREDNISONE 10 MG (21) PO TBPK
10.0000 mg | ORAL_TABLET | ORAL | Status: AC
  Administered 2020-12-02: 10 mg via ORAL

## 2020-12-02 MED ORDER — PREDNISONE 10 MG (21) PO TBPK: 20.0000 mg | ORAL_TABLET | Freq: Every evening | ORAL | Status: DC

## 2020-12-02 MED ORDER — HYDRALAZINE HCL 50 MG PO TABS
50.0000 mg | ORAL_TABLET | Freq: Three times a day (TID) | ORAL | Status: DC
  Administered 2020-12-02 – 2020-12-03 (×4): 50 mg via ORAL
  Filled 2020-12-02 (×4): qty 1

## 2020-12-02 MED ORDER — FLUTICASONE PROPIONATE 50 MCG/ACT NA SUSP
1.0000 | Freq: Every day | NASAL | Status: DC
  Administered 2020-12-02 – 2020-12-03 (×2): 1 via NASAL
  Filled 2020-12-02 (×2): qty 16

## 2020-12-02 MED ORDER — PREDNISONE 10 MG (21) PO TBPK
10.0000 mg | ORAL_TABLET | Freq: Three times a day (TID) | ORAL | Status: DC
Start: 2020-12-03 — End: 2020-12-03
  Administered 2020-12-03 (×2): 10 mg via ORAL

## 2020-12-02 MED ORDER — PREDNISONE 10 MG (21) PO TBPK
20.0000 mg | ORAL_TABLET | Freq: Every morning | ORAL | Status: AC
  Administered 2020-12-02: 20 mg via ORAL
  Filled 2020-12-02: qty 21

## 2020-12-02 MED ORDER — SODIUM CHLORIDE 0.9 % IV SOLN: INTRAVENOUS | Status: AC

## 2020-12-02 MED ORDER — PREDNISONE 10 MG (21) PO TBPK
20.0000 mg | ORAL_TABLET | Freq: Every evening | ORAL | Status: AC
  Administered 2020-12-02: 20 mg via ORAL

## 2020-12-02 MED ORDER — PREDNISONE 10 MG (21) PO TBPK
10.0000 mg | ORAL_TABLET | Freq: Four times a day (QID) | ORAL | Status: DC
Start: 2020-12-04 — End: 2020-12-03

## 2020-12-02 NOTE — Hospital Course (Addendum)
Kevin [name] is a 65 y.o. male presenting with worsening dyspnea and hypertensive emergency. PMH is significant for COPD, CHF, PAD, CKD, OSA, HIV, and PAF.   Acute hypoxic respiratory failure secondary to COPD exacerbation  Patient had worsening dyspnea for 2 weeks. Home medications include albuterol and Dulera but patient reported on admission that he had not been taking these medications. Chest x-ray showed no acute cardiopulmonary disease.  Patient was treated with BiPAP, Duonebs, brovana, pulmicort, IV and then PO steroids. Upon discharge, pt was breathing comfortable on room air with oxygen saturations in the mid 90's. He was discharged on oral prednisone and with his home dulera, albuterol inhaler, and albuterol nebulizer.  Hypertensive emergency, resolved  BP was 220/134 on admission, was treated via CCM with IV labetalol, IV Lasix, NTG paste and was restarted on home medications including Amlodipine 5 mg daily, Coreg 6.25 mg twice daily with meals,  and Hydralazine 50 mg increased from BID to TID.  At discharge, amlodipine was increased to 10 mg.  CKD stage III Cr on admission of 1.52, was up to 2.38 on 10/20. Pt had renal US  on 10/20 which showed no hydronephrosis and was consistent with medical renal disease.He also had serial bladder scans which were unrevealing. Pt had FENa 0.5, Cr continued to be increased to 2.25 on 12/03/20 with GFR of 32, was 51 on admission. He was treated with IV fluids and had good urinary output.   Chronic Diastolic Heart Failure BNP from 11/30/2020 of 118. Echo from 12/01/20 showed LVEF of 55-60 % with normal LV function, mild concentric LV hypertrophy, and grade 1 diastolic dysfunction.   HIV Pt received Biktarvy. Pts CrCl was low at 31.8 mL/min  Issues for follow-up -Needs BMP for monitoring of kidney function - Biktarvy not recommended if CrCl <30. May need ID consult - Monitor BP and recommend titration of hydralazine - Recommend chronic care  management consult as having challenges affording medications - Discuss smoking cessation  - Repeat chest CT in January 2023 for pulmonary nodules - Recommend referral to Nephrology in near future for underlying CKD

## 2020-12-02 NOTE — Progress Notes (Signed)
Mobility Specialist Progress Note:   12/02/20 1230  Mobility  Activity Ambulated in hall  Level of Assistance Independent  Assistive Device None  Distance Ambulated (ft) 400 ft  Mobility Ambulated independently in hallway  Mobility Response Tolerated well  Mobility performed by Mobility specialist  $Mobility charge 1 Mobility   SATURATION QUALIFICATIONS: (This note is used to comply with regulatory documentation for home oxygen)  Patient Saturations on Room Air at Rest = 97%  Patient Saturations on Room Air while Ambulating = 95%  Patient Saturations on N/A Liters of oxygen while Ambulating = N/A%  Pt received in bed, agreed to mobility. Ambulated in hall 400' on RA, with no AD and supervision. SpO2 stayed >95% during amb. Pt asx during amb, left sitting EOB with all needs met.   Nelta Numbers Mobility Specialist  Phone [phone]

## 2020-12-02 NOTE — Progress Notes (Signed)
Family Medicine Teaching Service Daily Progress Note Intern Pager: [phone]  Patient name: Kevin [name] Medical record number: [id number] Date of birth: [birth year] Age: 65 y.o. Gender: male  Primary Care Provider: Gifford Shave, MD Consultants: CCM Code Status: Full  Pt Overview and Major Events to Date:  11/30/2020-admitted 12/01/2020-transferred from CCM to progressive  Assessment and Plan: Kevin [name] is 65 year old male presenting with worsening dyspnea and hypertensive emergency.  Past medical history significant for COPD, CHF, PAD, CKD, OSA, HIV, and PAF  Acute hypoxic respiratory failure secondary to COPD exacerbation  OSA All medications include albuterol Dulera but patient reported he had not been taking his medications.  Patient states he did not use BiPAP last night because he felt fine.  States he does not use CPAP at home.  Patient finished course of Solu-Medrol last night for total of 4 doses.  Yesterday patient was receiving DuoNebs 3 times daily, this will be spaced out to twice daily today.  Denies shortness of breath this morning.  Patient follows with Kevin [name] for pulmonology and has follow-up visit for PFTs.  This morning patient is breathing comfortably on room air with oxygen saturations in the mid 90s. Pt states he is congested this morning. Would like a nasal spray -Albuterol nebulizer 2.5 mg every 6 hours as needed for wheezing and shortness of breath - Brovana nebulizer 15 mcg twice daily -Pulmicort nebulizer 0.25 mg twice daily -DuoNeb 3 mL twice daily -flonase daily   Hypertensive emergency BP was 220/134 on admission, was treated via CCM with IV labetalol, IV Lasix, NTG paste and was restarted on home medications.  BP this morning is 181/86.  -Amlodipine 5 mg daily  -Coreg 6.25 mg twice daily with meals - Increase Hydralazine 50 mg TID  CKD stage III Cr increased to  2.38 from 2.29.  - 100 milliliters per hour normal saline for her 5  hours -Urine sodium -Urine creatinine -Renal ultrasound -Bladder scans every 8 hours -A.m. BMP  HIV Home medication includes Biktarvy.  We will continue to monitor GFR and hold Biktarvy if needed. -Continue home Biktarvy -Monitor GFR    FEN/GI: Heart healthy/carb modified PPx: Heparin Dispo:Home tomorrow. Barriers include continued medical management.   Subjective:  Patient states he is feeling much better today.  Denies any shortness of breath.  States he has some nasal congestion and would like nasal spray.  Objective: Temp:  [97.5 F (36.4 C)-98.5 F (36.9 C)] 98 F (36.7 C) (10/20 0820) Pulse Rate:  [64-90] 70 (10/20 0905) Resp:  [18-21] 21 (10/20 0820) BP: (147-181)/(86-117) 181/86 (10/20 0905) SpO2:  [92 %-98 %] 96 % (10/20 0856) Weight:  [65.3 kg] 65.3 kg (10/20 0429) Physical Exam: General: Lying in bed, NAD Cardiovascular: RRR Respiratory: CTAB, normal work of breathing on room air Abdomen: Soft, nontender to palpation, nondistended Extremities: No edema BLEs  Laboratory: Recent Labs  Lab 11/30/20 0208 11/30/20 0927 12/01/20 0249  WBC 8.6 11.4* 14.6*  HGB 11.4* 12.6* 13.0  HCT 33.6* 36.1* 37.6*  PLT 277 294 323   Recent Labs  Lab 11/30/20 0208 11/30/20 0927 12/01/20 0249 12/01/20 1316 12/02/20 0309  NA 140  --  130* 135 135  K 3.5  --  3.4* 4.3 4.0  CL 106  --  100 105 105  CO2 27  --  21* 22 23  BUN 14  --  31* 36* 41*  CREATININE 1.52*   < > 2.20* 2.29* 2.38*  CALCIUM 8.8*  --  8.9 9.1 8.7*  PROT 7.9  --   --   --   --   BILITOT 0.6  --   --   --   --   ALKPHOS 75  --   --   --   --   ALT 15  --   --   --   --   AST 25  --   --   --   --   GLUCOSE 90  --  116* 149* 122*   < > = values in this interval not displayed.      Kevin Gilding, DO 12/02/2020, 9:32 AM PGY-1, [place] Intern pager: [phone], text pages welcome

## 2020-12-02 NOTE — Progress Notes (Signed)
FPTS Brief Progress Note  S: Pt without concerns. He is glad his BP is staying down. In speaking about his rising kidney function tests, he states he does not want to end up on HD. He had to see a nephrologist previously.    O: BP (!) 155/106 (BP Location: Right Arm)   Pulse 73   Temp 98.4 F (36.9 C) (Oral)   Resp 18   Ht 6' (1.829 m)   Wt 64.3 kg   SpO2 95%   BMI 19.22 kg/m     A/P: Discussed recent labs with patient. All questions asked were answered.  - Orders reviewed. Labs for AM ordered, which was adjusted as needed.    Lyndee Hensen, DO 12/02/2020, 3:40 AM PGY-3, [place] [facility] Night Resident  Please page [phone] with questions.

## 2020-12-03 ENCOUNTER — Other Ambulatory Visit (HOSPITAL_COMMUNITY): Payer: Self-pay

## 2020-12-03 DIAGNOSIS — N179 Acute kidney failure, unspecified: Secondary | ICD-10-CM | POA: Diagnosis not present

## 2020-12-03 DIAGNOSIS — J441 Chronic obstructive pulmonary disease with (acute) exacerbation: Secondary | ICD-10-CM | POA: Diagnosis not present

## 2020-12-03 DIAGNOSIS — N289 Disorder of kidney and ureter, unspecified: Secondary | ICD-10-CM | POA: Diagnosis not present

## 2020-12-03 DIAGNOSIS — I169 Hypertensive crisis, unspecified: Secondary | ICD-10-CM | POA: Diagnosis not present

## 2020-12-03 LAB — BASIC METABOLIC PANEL
Anion gap: 6 (ref 5–15)
BUN: 43 mg/dL — ABNORMAL HIGH (ref 8–23)
CO2: 23 mmol/L (ref 22–32)
Calcium: 8.2 mg/dL — ABNORMAL LOW (ref 8.9–10.3)
Chloride: 109 mmol/L (ref 98–111)
Creatinine, Ser: 2.25 mg/dL — ABNORMAL HIGH (ref 0.61–1.24)
GFR, Estimated: 32 mL/min — ABNORMAL LOW (ref >60)
Glucose, Bld: 81 mg/dL (ref 70–99)
Potassium: 4.2 mmol/L (ref 3.5–5.1)
Sodium: 138 mmol/L (ref 135–145)

## 2020-12-03 LAB — GLUCOSE, CAPILLARY
Glucose-Capillary: 112 mg/dL — ABNORMAL HIGH (ref 70–99)
Glucose-Capillary: 114 mg/dL — ABNORMAL HIGH (ref 70–99)
Glucose-Capillary: 120 mg/dL — ABNORMAL HIGH (ref 70–99)
Glucose-Capillary: 140 mg/dL — ABNORMAL HIGH (ref 70–99)

## 2020-12-03 LAB — CREATININE, URINE, RANDOM: Creatinine, Urine: 114.05 mg/dL

## 2020-12-03 LAB — SODIUM, URINE, RANDOM: Sodium, Ur: 34 mmol/L

## 2020-12-03 MED ORDER — DULERA 100-5 MCG/ACT IN AERO
2.0000 | INHALATION_SPRAY | Freq: Every morning | RESPIRATORY_TRACT | 0 refills | Status: DC
  Filled 2020-12-03: qty 13, 30d supply, fill #0
  Filled 2020-12-03: qty 13, 15d supply, fill #0

## 2020-12-03 MED ORDER — ALBUTEROL SULFATE HFA 108 (90 BASE) MCG/ACT IN AERS
2.0000 | INHALATION_SPRAY | Freq: Four times a day (QID) | RESPIRATORY_TRACT | 0 refills | Status: DC | PRN
  Filled 2020-12-03: qty 18, 25d supply, fill #0

## 2020-12-03 MED ORDER — HYDRALAZINE HCL 50 MG PO TABS
50.0000 mg | ORAL_TABLET | Freq: Three times a day (TID) | ORAL | 0 refills | Status: DC
  Filled 2020-12-03: qty 90, 30d supply, fill #0

## 2020-12-03 MED ORDER — ALBUTEROL SULFATE (2.5 MG/3ML) 0.083% IN NEBU
2.5000 mg | INHALATION_SOLUTION | Freq: Four times a day (QID) | RESPIRATORY_TRACT | 0 refills | Status: DC | PRN
  Filled 2020-12-03: qty 90, 8d supply, fill #0

## 2020-12-03 MED ORDER — AMLODIPINE BESYLATE 10 MG PO TABS: 10.0000 mg | ORAL_TABLET | Freq: Every day | ORAL | Status: DC

## 2020-12-03 MED ORDER — AMLODIPINE BESYLATE 10 MG PO TABS
10.0000 mg | ORAL_TABLET | Freq: Every day | ORAL | 0 refills | Status: DC
  Filled 2020-12-03: qty 30, 30d supply, fill #0

## 2020-12-03 MED ORDER — PREDNISONE 10 MG (21) PO TBPK: ORAL_TABLET | ORAL | 0 refills | Status: DC

## 2020-12-03 MED ORDER — SODIUM CHLORIDE 0.9 % IV SOLN: INTRAVENOUS | Status: DC

## 2020-12-03 NOTE — Discharge Instructions (Addendum)
   Dear Kevin [name],  Thank you for letting us participate in your care. You were hospitalized for worsening shortness of breath due to an exacerbation of COPD and very high blood pressure.  You received breathing treatments and steroids for your COPD exacerbation.  You received antihypertensive medication for your high blood pressure.  While you are in the hospital, your kidneys were not working as well as normal and you received IV fluids.   POST-HOSPITAL & CARE INSTRUCTIONS Please resume your home medications for your COPD and high blood pressure.  All of your medications have remained the same with the exception of the following: -Your amlodipine has been increased from 5 mg daily to 10 mg daily  -your hydralazine 50 mg has been increased from twice daily to 3 times daily. Go to your follow up appointments (listed below) Please follow-up with your primary care physician within 1 week of discharge   Take care and be well!  [facility]  [place], [place] [address] [phone]

## 2020-12-03 NOTE — Progress Notes (Signed)
FPTS Brief Progress Note  S:Pt resting comfortably    O: BP (!) 178/91 (BP Location: Left Arm)   Pulse (!) 55   Temp 98.1 F (36.7 C) (Oral)   Resp 20   Ht 6' (1.829 m)   Wt 66 kg   SpO2 97%   BMI 19.72 kg/m     A/P: Follow up AM Cr - Orders reviewed. Labs for AM ordered, which was adjusted as needed.    Lyndee Hensen, DO 12/03/2020, 6:22 AM PGY-3,  Family Medicine Night Resident  Please page [phone] with questions.

## 2020-12-03 NOTE — Progress Notes (Signed)
Family Medicine Teaching Service Daily Progress Note Intern Pager: [phone]  Patient name: Kevin [name] Medical record number: [id number] Date of birth: 07/23/1955 Age: 65 y.o. Gender: male  Primary Care Provider: Gifford Shave, MD Consultants: None Code Status: Full  Pt Overview and Major Events to Date:  12/01/2018-admitted  Assessment and Plan: Kevin [name] is a 65 year old male who presented with worsening dyspnea and hypertensive emergency.  Past medical history significant for COPD, CHF, PAD, CKD, OSA, HIV, and PAF  Acute hypoxic respiratory failure secondary to COPD exacerbation  OSA This morning patient is breathing comfortably on room air with oxygen saturations in the mid 90s.  Yesterday patient was started on prednisone taper, duo nebs were spaced out to twice daily.  Patient has not used prn albuterol nebulizer.  -Continue prednisone taper -Albuterol nebulizer 2.5 mg every 6 hours as needed for wheezing or shortness of breath -Brovana nebulizer 15 mcg twice daily -Pulmicort nebulizer 0.25 mcg twice daily -DuoNebs 3 mL twice daily -Flonase daily  Hypertension On admission patient was in hypertensive emergency which has resolved.  Blood pressures are still running high with BP up to 181/86 yesterday morning.  His hydralazine was increased from twice daily to 3 times daily.  Blood pressure this morning is 176/96 -increase Amlodipine 10 mg daily -Coreg 6.25 mg twice daily with meals -Hydralazine 50 mg 3 times daily  CKD stage III with AKI Cr on admission of 1.52, was up to 2.38 on 10/20 and pt received 500 mL LR. Cr today of 2.25. GFR of 32 today, was 51 on admission. Pt had renal US yesterday which showed no hydronephrosis and was consistent with medical renal disease. Urine creatinine and urine sodium were WNL. 24 hr UOP of 1.25L (0.8 mL/Kg/hr). I suspect AKI is due to recent hypertensive emergency and sustained high Bps.  -Patient should follow-up closely with  primary care physician after discharge to monitor kidney function -150 mL/H for 4 hours   HIV -continue home [place]  -monitor GFR and creatinine, if worsens, consult ID   FEN/GI: Heart healthy/carb modified PPx: Heparin Dispo:Home today. Barriers include need for IVF.   Subjective:  Patient states he is feeling well this morning, is eating and drinking well.  No issues with urination or bowel movements.  States he would like to go home today.  Objective: Temp:  [98 F (36.7 C)-98.2 F (36.8 C)] 98.1 F (36.7 C) (10/21 0451) Pulse Rate:  [55-79] 55 (10/21 0451) Resp:  [15-21] 20 (10/21 0451) BP: (145-181)/(74-117) 178/91 (10/21 0451) SpO2:  [92 %-98 %] 97 % (10/21 0451) Weight:  [66 kg] 66 kg (10/21 0451) Physical Exam: General: Patient is sitting up in bed eating breakfast, NAD Cardiovascular: RRR Respiratory: CTAB, normal work of breathing   Laboratory: Recent Labs  Lab 11/30/20 0208 11/30/20 0927 12/01/20 0249  WBC 8.6 11.4* 14.6*  HGB 11.4* 12.6* 13.0  HCT 33.6* 36.1* 37.6*  PLT 277 294 323   Recent Labs  Lab 11/30/20 0208 11/30/20 0927 12/01/20 1316 12/02/20 0309 12/03/20 0206  NA 140   < > 135 135 138  K 3.5   < > 4.3 4.0 4.2  CL 106   < > 105 105 109  CO2 27   < > 22 23 23   BUN 14   < > 36* 41* 43*  CREATININE 1.52*   < > 2.29* 2.38* 2.25*  CALCIUM 8.8*   < > 9.1 8.7* 8.2*  PROT 7.9  --   --   --   --  BILITOT 0.6  --   --   --   --   ALKPHOS 75  --   --   --   --   ALT 15  --   --   --   --   AST 25  --   --   --   --   GLUCOSE 90   < > 149* 122* 81   < > = values in this interval not displayed.      Precious Gilding, DO 12/03/2020, 7:05 AM PGY-1, [place] Intern pager: [phone], text pages welcome

## 2020-12-03 NOTE — Care Management (Signed)
1353 12-03-20 Case Manager received an order for a nebulizer machine. DME ordered via [employer] and it will be delivered to the room prior to transition home. No further needs from Case Manager at this time.

## 2020-12-05 NOTE — Discharge Summary (Addendum)
[facility] Discharge Summary  Patient name: Kevin [name] Medical record number: [id number] Date of birth: November 03, 1955 Age: 65 y.o. Gender: male Date of Admission: 11/30/2020   Date of Discharge: 12/03/20 Admitting Physician: Kevin Rodman Pickle, MD  Primary Care Provider: Gifford Shave, MD Consultants: Kevin [name]  Indication for Hospitalization: Acute hypoxic respiratory failure secondary to COPD exacerbation and hypertensive emergency  Discharge Diagnoses/Problem List:  Active Problems:   Shortness of breath   COPD exacerbation ([place])   Hypertensive urgency   Hypertensive crisis   Acute respiratory failure with hypoxia ([place])   Disposition: Home  Discharge Condition: Stable  Discharge Exam:  General: Patient is sitting up in bed eating breakfast, NAD Cardiovascular: RRR Respiratory: CTAB, normal work of breathing  Brief Hospital Course:  Kevin [name] is a 65 y.o. male admitted with COPD exacerbation and hypertensive emergency. PMH is significant for COPD, CHF, PAD, CKD, OSA, HIV, and PAF.   Acute hypoxic respiratory failure secondary to COPD exacerbation  Patient had worsening dyspnea for 2 weeks prior to presentation. Home medications include albuterol and Dulera but patient reported on admission that he had not been taking these medications. Chest x-ray showed no acute cardiopulmonary disease.  Patient was treated with BiPAP, Duonebs, brovana, pulmicort, and IV followed by PO steroids. Upon discharge, pt was breathing comfortable on room air with oxygen saturations in the mid 90's. He was discharged on oral prednisone and with his home dulera, albuterol inhaler, and albuterol nebulizer.  Hypertensive emergency, resolved  BP was 220/134 on admission, was treated via Kevin [name] with IV labetalol, IV Lasix, NTG paste and was restarted on home medications including Amlodipine 5 mg daily, Coreg 6.25 mg twice daily with meals,  and Hydralazine 50 mg increased  from BID to TID.  At discharge, amlodipine was increased to 10 mg.  CKD stage III Cr on admission of 1.52, was up to 2.38 on 10/20. Pt had renal US  on 10/20 which showed no hydronephrosis and was consistent with medical renal disease.He also had serial bladder scans which were unrevealing. Pt had FENa 0.5, Cr continued to be increased to 2.25 on 12/03/20 with GFR of 32, was 51 on admission. He was treated with IV fluids and had good urinary output with some improvement in his creatinine prior to discharge.  Chronic Diastolic Heart Failure BNP from 11/30/2020 of 118. Echo from 12/01/20 showed LVEF of 55-60 % with normal LV function, mild concentric LV hypertrophy, and grade 1 diastolic dysfunction.   HIV Pt received Biktarvy. Pts CrCl was low at 31.8 mL/min  Issues for follow-up -Needs BMP for monitoring of kidney function - Biktarvy not recommended if CrCl <30. May need ID consult - Monitor BP and recommend titration of hydralazine - Recommend chronic care management consult as having challenges affording medications - Discuss smoking cessation  - Repeat chest CT in January 2023 for surveillance of pulmonary nodules - Recommend referral to Nephrology in near future for underlying CKD     Significant Labs and Imaging:  Recent Labs  Lab 11/30/20 0208 11/30/20 0927 12/01/20 0249  WBC 8.6 11.4* 14.6*  HGB 11.4* 12.6* 13.0  HCT 33.6* 36.1* 37.6*  PLT 277 294 323   Recent Labs  Lab 11/30/20 0208 11/30/20 0927 12/01/20 0249 12/01/20 1316 12/02/20 0309 12/03/20 0206  NA 140  --  130* 135 135 138  K 3.5  --  3.4* 4.3 4.0 4.2  CL 106  --  100 105 105 109  CO2 27  --  21* 22 23 23   GLUCOSE 90  --  116* 149* 122* 81  BUN 14  --  31* 36* 41* 43*  CREATININE 1.52* 1.30* 2.20* 2.29* 2.38* 2.25*  CALCIUM 8.8*  --  8.9 9.1 8.7* 8.2*  MG  --  1.9 2.0  --   --   --   ALKPHOS 75  --   --   --   --   --   AST 25  --   --   --   --   --   ALT 15  --   --   --   --   --   ALBUMIN 3.3*   --   --   --   --   --         Discharge Medications:  Allergies as of 12/03/2020   No Known Allergies      Medication List     STOP taking these medications    BC HEADACHE PO       TAKE these medications    amLODipine 10 MG tablet Commonly known as: NORVASC Take 1 tablet (10 mg total) by mouth daily. What changed:  medication strength how much to take   Biktarvy 50-200-25 MG Tabs tablet Generic drug: bictegravir-emtricitabine-tenofovir AF Take 1 tablet by mouth daily.   carvedilol 6.25 MG tablet Commonly known as: COREG Take 1 tablet (6.25 mg total) by mouth 2 (two) times daily with a meal.   Dulera 100-5 MCG/ACT Aero Generic drug: mometasone-formoterol Inhale 2 puffs into the lungs every morning.   hydrALAZINE 50 MG tablet Commonly known as: APRESOLINE Take 1 tablet (50 mg total) by mouth 3 (three) times daily. What changed: when to take this   multivitamin tablet Take 1 tablet by mouth daily.   predniSONE 10 MG (21) Tbpk tablet Commonly known as: STERAPRED UNI-PAK 21 TAB Take as directed per dosepack   Ventolin HFA 108 (90 Base) MCG/ACT inhaler Generic drug: albuterol Inhale 2 puffs into the lungs every 6 (six) hours as needed for wheezing or shortness of breath.   albuterol (2.5 MG/3ML) 0.083% nebulizer solution Commonly known as: PROVENTIL Take 3 mLs (2.5 mg total) by nebulization every 6 (six) hours as needed for wheezing or shortness of breath.        Discharge Instructions: Please refer to Patient Instructions section of EMR for full details.  Patient was counseled important signs and symptoms that should prompt return to medical care, changes in medications, dietary instructions, activity restrictions, and follow up appointments.   Follow-Up Appointments:  Follow-up Information     [facility] Follow up.   Why: Nebulizer Machine-agency to deliver to the room prior to d/c. Contact information: [place] [address] [phone]                 Kevin Gilding, DO 12/05/2020, 6:18 PM PGY-1, [place] Upper-Level Resident Addendum   I have discussed the above with Dr. Ronnald Ramp and agree with the documentation. My edits for correction/addition/clarification are included above. Please see any attending notes.   Alcus Dad, MD PGY-2, [place]

## 2020-12-14 ENCOUNTER — Ambulatory Visit: Payer: Medicaid Other

## 2021-01-01 ENCOUNTER — Other Ambulatory Visit: Payer: Self-pay

## 2021-01-01 ENCOUNTER — Emergency Department (HOSPITAL_COMMUNITY)
Admission: EM | Admit: 2021-01-01 | Discharge: 2021-01-01 | Disposition: A | Discharge status: Home / Self Care | Payer: Medicaid Other | Attending: Emergency Medicine | Admitting: Emergency Medicine

## 2021-01-01 ENCOUNTER — Encounter (HOSPITAL_COMMUNITY): Payer: Self-pay | Admitting: Emergency Medicine

## 2021-01-01 ENCOUNTER — Emergency Department (HOSPITAL_COMMUNITY): Payer: Medicaid Other

## 2021-01-01 DIAGNOSIS — Z951 Presence of aortocoronary bypass graft: Secondary | ICD-10-CM | POA: Diagnosis not present

## 2021-01-01 DIAGNOSIS — R0600 Dyspnea, unspecified: Secondary | ICD-10-CM

## 2021-01-01 DIAGNOSIS — J441 Chronic obstructive pulmonary disease with (acute) exacerbation: Secondary | ICD-10-CM

## 2021-01-01 DIAGNOSIS — F17210 Nicotine dependence, cigarettes, uncomplicated: Secondary | ICD-10-CM | POA: Diagnosis not present

## 2021-01-01 DIAGNOSIS — I5033 Acute on chronic diastolic (congestive) heart failure: Secondary | ICD-10-CM | POA: Insufficient documentation

## 2021-01-01 DIAGNOSIS — I130 Hypertensive heart and chronic kidney disease with heart failure and stage 1 through stage 4 chronic kidney disease, or unspecified chronic kidney disease: Secondary | ICD-10-CM | POA: Diagnosis not present

## 2021-01-01 DIAGNOSIS — Z21 Asymptomatic human immunodeficiency virus [HIV] infection status: Secondary | ICD-10-CM | POA: Insufficient documentation

## 2021-01-01 DIAGNOSIS — I25119 Atherosclerotic heart disease of native coronary artery with unspecified angina pectoris: Secondary | ICD-10-CM | POA: Diagnosis not present

## 2021-01-01 DIAGNOSIS — Z7951 Long term (current) use of inhaled steroids: Secondary | ICD-10-CM | POA: Diagnosis not present

## 2021-01-01 DIAGNOSIS — R0602 Shortness of breath: Secondary | ICD-10-CM | POA: Diagnosis present

## 2021-01-01 DIAGNOSIS — Z79899 Other long term (current) drug therapy: Secondary | ICD-10-CM | POA: Diagnosis not present

## 2021-01-01 DIAGNOSIS — Z28310 Unvaccinated for covid-19: Secondary | ICD-10-CM | POA: Diagnosis not present

## 2021-01-01 DIAGNOSIS — N183 Chronic kidney disease, stage 3 unspecified: Secondary | ICD-10-CM | POA: Diagnosis not present

## 2021-01-01 LAB — BASIC METABOLIC PANEL
Anion gap: 9 (ref 5–15)
BUN: 20 mg/dL (ref 8–23)
CO2: 21 mmol/L — ABNORMAL LOW (ref 22–32)
Calcium: 9.1 mg/dL (ref 8.9–10.3)
Chloride: 107 mmol/L (ref 98–111)
Creatinine, Ser: 1.61 mg/dL — ABNORMAL HIGH (ref 0.61–1.24)
GFR, Estimated: 48 mL/min — ABNORMAL LOW (ref >60)
Glucose, Bld: 119 mg/dL — ABNORMAL HIGH (ref 70–99)
Potassium: 3.9 mmol/L (ref 3.5–5.1)
Sodium: 137 mmol/L (ref 135–145)

## 2021-01-01 LAB — CBC
HCT: 37.7 % — ABNORMAL LOW (ref 39.0–52.0)
Hemoglobin: 12.4 g/dL — ABNORMAL LOW (ref 13.0–17.0)
MCH: 30.8 pg (ref 26.0–34.0)
MCHC: 32.9 g/dL (ref 30.0–36.0)
MCV: 93.5 fL (ref 80.0–100.0)
Platelets: 327 10*3/uL (ref 150–400)
RBC: 4.03 MIL/uL — ABNORMAL LOW (ref 4.22–5.81)
RDW: 15.2 % (ref 11.5–15.5)
WBC: 9.9 10*3/uL (ref 4.0–10.5)
nRBC: 0 % (ref 0.0–0.2)

## 2021-01-01 LAB — BRAIN NATRIURETIC PEPTIDE: B Natriuretic Peptide: 22.7 pg/mL (ref 0.0–100.0)

## 2021-01-01 MED ORDER — ALBUTEROL SULFATE (2.5 MG/3ML) 0.083% IN NEBU
5.0000 mg | INHALATION_SOLUTION | Freq: Once | RESPIRATORY_TRACT | Status: AC
  Administered 2021-01-01: 5 mg via RESPIRATORY_TRACT
  Filled 2021-01-01: qty 6

## 2021-01-01 MED ORDER — ALBUTEROL SULFATE HFA 108 (90 BASE) MCG/ACT IN AERS
2.0000 | INHALATION_SPRAY | RESPIRATORY_TRACT | Status: DC | PRN
  Administered 2021-01-01: 2 via RESPIRATORY_TRACT
  Filled 2021-01-01: qty 6.7

## 2021-01-01 MED ORDER — CLONIDINE HCL 0.1 MG PO TABS
0.1000 mg | ORAL_TABLET | Freq: Once | ORAL | Status: AC
  Administered 2021-01-01: 0.1 mg via ORAL
  Filled 2021-01-01: qty 1

## 2021-01-01 MED ORDER — AEROCHAMBER PLUS FLO-VU LARGE MISC
1.0000 | Freq: Once | Status: AC
  Administered 2021-01-01: 1

## 2021-01-01 MED ORDER — HYDRALAZINE HCL 20 MG/ML IJ SOLN
10.0000 mg | Freq: Once | INTRAMUSCULAR | Status: AC
  Administered 2021-01-01: 10 mg via INTRAVENOUS
  Filled 2021-01-01: qty 1

## 2021-01-01 MED ORDER — PREDNISONE 10 MG PO TABS: 20.0000 mg | ORAL_TABLET | Freq: Every day | ORAL | 0 refills | Status: DC

## 2021-01-01 MED ORDER — ALBUTEROL SULFATE (2.5 MG/3ML) 0.083% IN NEBU
5.0000 mg | INHALATION_SOLUTION | RESPIRATORY_TRACT | Status: AC
  Administered 2021-01-01: 5 mg via RESPIRATORY_TRACT
  Filled 2021-01-01: qty 6

## 2021-01-01 MED ORDER — METHYLPREDNISOLONE SODIUM SUCC 125 MG IJ SOLR
125.0000 mg | Freq: Once | INTRAMUSCULAR | Status: DC
  Filled 2021-01-01: qty 2

## 2021-01-01 NOTE — ED Notes (Signed)
Pt taken off 3L [place] per Dr.Ray. On RA, he is satting at 96%.

## 2021-01-01 NOTE — Discharge Instructions (Signed)
These call home health agency on Monday for your BiPAP machine. Please take prednisone as prescribed and follow-up with your doctor this week.

## 2021-01-01 NOTE — ED Provider Notes (Signed)
[facility] EMERGENCY DEPARTMENT Provider Note   CSN: [id number] Arrival date & time: 01/01/21  7654     History Chief Complaint  Patient presents with   Shortness of Breath    Kevin [name] is a 65 y.o. male.  HPI 65 year old male history of COPD, CKD, CHF, HIV, peripheral vascular disease, smoking presents today complaining of cough and dyspnea.  He was recently hospitalized with a COPD exacerbation and hypertensive urgency.  He reports taking medications as prescribed.  States that he began getting dyspneic again yesterday.  He has had productive cough and subjective fever.  Reports taking albuterol without relief.  He has pain with coughing.  He denies any history of COVID, he has not had his vaccine for COVID or flu.  He reports taking all medications as prescribed.    Past Medical History:  Diagnosis Date   AIDS (acquired immune deficiency syndrome) ([place]) 08/17/2016   Chronic diastolic CHF (congestive heart failure), NYHA class 3 (HCC) 01/2016   Chronic lower back pain    CKD (chronic kidney disease), stage III (HCC)    COPD (chronic obstructive pulmonary disease) ([place])    Gout    "forearms, hands, ankles, feet" (06/05/2016)   Headache    "weekly" (06/05/2016)   Heart murmur    Hypertension    Hypertensive crisis 08/15/2017   OSA on CPAP    PAD (peripheral artery disease) (HCC)    PAF (paroxysmal atrial fibrillation) ([place]) 01/2016    Patient Active Problem List   Diagnosis Date Noted   Hypertensive crisis    Acute respiratory failure with hypoxia ([place])    Dyspnea 04/04/2019   Callus of foot 07/10/2018   Hypertensive urgency 08/15/2017   Coronary artery disease involving native coronary artery of native heart with unstable angina pectoris ([place]) 10/19/2016   Easy bruising 08/11/2016   Claudication in peripheral vascular disease ([place]) 06/05/2016   Stage 3 chronic kidney disease ([place])    Peripheral arterial disease ([place]) 05/09/2016   Acute on  chronic diastolic CHF (congestive heart failure), NYHA class 3 ([place]) 03/24/2016   OSA (obstructive sleep apnea) 03/24/2016   Essential hypertension 02/18/2016   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   Shortness of breath 02/07/2016   PAF (paroxysmal atrial fibrillation) ([place]) 02/07/2016   Tobacco abuse 02/07/2016   Acute on chronic renal insufficiency 02/07/2016   COPD exacerbation ([place]) 02/07/2016   Hypertensive emergency 02/07/2016   HIV disease ([place]) 08/27/2004    Past Surgical History:  Procedure Laterality Date   LEFT HEART CATH AND CORONARY ANGIOGRAPHY N/A 10/23/2016   Procedure: LEFT HEART CATH AND CORONARY ANGIOGRAPHY;  Surgeon: Leonie Man, MD;  Location: [facility];  Service: Cardiovascular;  Laterality: N/A;   LOWER EXTREMITY ANGIOGRAPHY N/A 07/17/2016   Procedure: Lower Extremity Angiography;  Surgeon: Lorretta Harp, MD;  Location: [facility];  Service: Cardiovascular;  Laterality: N/A;   LOWER EXTREMITY INTERVENTION N/A 06/05/2016   Procedure: Lower Extremity Intervention;  Surgeon: Lorretta Harp, MD;  Location: [facility];  Service: Cardiovascular;  Laterality: N/A;   PERIPHERAL VASCULAR ATHERECTOMY Right 07/17/2016   Procedure: Peripheral Vascular Atherectomy;  Surgeon: Lorretta Harp, MD;  Location: [facility];  Service: Cardiovascular;  Laterality: Right;  SFA   PERIPHERAL VASCULAR INTERVENTION  06/05/2016   Procedure: Peripheral Vascular Intervention;  Surgeon: Lorretta Harp, MD;  Location: [facility];  Service: Cardiovascular;;  left SFA       Family  History  Problem Relation Age of Onset   High blood pressure Mother    Lupus Mother     Social History   Tobacco Use   Smoking status: Every Day    Packs/day: 0.50    Years: 42.00    Pack years: 21.00    Types: Cigarettes   Smokeless tobacco: Never   Tobacco comments:    4 a day  Vaping Use   Vaping Use: Never used  Substance Use Topics    Alcohol use: Yes    Alcohol/week: 2.0 standard drinks    Types: 2 Cans of beer per week   Drug use: No    Home Medications Prior to Admission medications   Medication Sig Start Date End Date Taking? Authorizing Provider  albuterol (PROVENTIL) (2.5 MG/3ML) 0.083% nebulizer solution Take 3 mLs (2.5 mg total) by nebulization every 6 (six) hours as needed for wheezing or shortness of breath. 12/03/20  Yes Alcus Dad, MD  albuterol (VENTOLIN HFA) 108 (90 Base) MCG/ACT inhaler Inhale 2 puffs into the lungs every 6 (six) hours as needed for wheezing or shortness of breath. 12/03/20  Yes Alcus Dad, MD  bictegravir-emtricitabine-tenofovir AF (BIKTARVY) 50-200-25 MG TABS tablet Take 1 tablet by mouth daily. 10/25/20  Yes Lamptey, Myrene Galas, MD  carvedilol (COREG) 6.25 MG tablet Take 1 tablet (6.25 mg total) by mouth 2 (two) times daily with a meal. Patient taking differently: Take 6.25 mg by mouth daily. 10/25/20  Yes Lamptey, Myrene Galas, MD  hydrALAZINE (APRESOLINE) 50 MG tablet Take 1 tablet (50 mg total) by mouth 3 (three) times daily. 12/03/20  Yes Alcus Dad, MD  mometasone-formoterol (DULERA) 100-5 MCG/ACT AERO Inhale 2 puffs into the lungs every morning. Patient taking differently: Inhale 2 puffs into the lungs daily as needed for wheezing or shortness of breath. 12/03/20  Yes Alcus Dad, MD  Multiple Vitamin (MULTIVITAMIN) tablet Take 1 tablet by mouth daily.   Yes [provider]  predniSONE (DELTASONE) 10 MG tablet Take 2 tablets (20 mg total) by mouth daily. 01/01/21  Yes Pattricia Boss, MD  amLODipine (NORVASC) 10 MG tablet Take 1 tablet (10 mg total) by mouth daily. Patient not taking: Reported on 01/01/2021 12/03/20   Alcus Dad, MD    Allergies    Patient has no known allergies.  Review of Systems   Review of Systems  All other systems reviewed and are negative.  Physical Exam Updated Vital Signs BP (!) 181/84   Pulse 82   Temp 97.9 F (36.6 C)    Resp 16   SpO2 92%   Physical Exam Constitutional:      General: He is not in acute distress.    Appearance: He is well-developed.  HENT:     Head: Normocephalic.     Mouth/Throat:     Mouth: Mucous membranes are moist.  Eyes:     Pupils: Pupils are equal, round, and reactive to light.  Cardiovascular:     Rate and Rhythm: Normal rate and regular rhythm.  Pulmonary:     Effort: Tachypnea present.     Breath sounds: Examination of the right-upper field reveals decreased breath sounds. Examination of the left-upper field reveals decreased breath sounds. Examination of the right-middle field reveals decreased breath sounds. Examination of the left-middle field reveals decreased breath sounds. Examination of the right-lower field reveals decreased breath sounds. Examination of the left-lower field reveals decreased breath sounds. Decreased breath sounds present.  Abdominal:     General: Bowel sounds are normal.  Palpations: Abdomen is soft.  Musculoskeletal:        General: Normal range of motion.     Cervical back: Normal range of motion.     Right lower leg: No tenderness. No edema.     Left lower leg: No edema.  Skin:    General: Skin is warm and dry.     Capillary Refill: Capillary refill takes less than 2 seconds.  Neurological:     General: No focal deficit present.     Mental Status: He is alert.  Psychiatric:        Mood and Affect: Mood normal.    ED Results / Procedures / Treatments   Labs (all labs ordered are listed, but only abnormal results are displayed) Labs Reviewed  BASIC METABOLIC PANEL - Abnormal; Notable for the following components:      Result Value   CO2 21 (*)    Glucose, Bld 119 (*)    Creatinine, Ser 1.61 (*)    GFR, Estimated 48 (*)    All other components within normal limits  CBC - Abnormal; Notable for the following components:   RBC 4.03 (*)    Hemoglobin 12.4 (*)    HCT 37.7 (*)    All other components within normal limits  BRAIN  NATRIURETIC PEPTIDE    EKG EKG Interpretation  Date/Time:  Saturday January 01 2021 08:02:30 EST Ventricular Rate:  63 PR Interval:  186 QRS Duration: 94 QT Interval:  402 QTC Calculation: 411 R Axis:   83 Text Interpretation: Normal sinus rhythm Left ventricular hypertrophy with repolarization abnormality ( Sokolow-Lyon ) Cannot rule out Septal infarct , age undetermined Abnormal ECG No significant change since last tracing Confirmed by Pattricia Boss [phone]) on 01/01/2021 12:06:43 PM  Radiology DG Chest 2 View  Result Date: 01/01/2021 CLINICAL DATA:  Shortness of breath. EXAM: CHEST - 2 VIEW COMPARISON:  12/01/2020 FINDINGS: Mild hyperexpansion. The lungs are clear without focal pneumonia, edema, pneumothorax or pleural effusion. The cardiopericardial silhouette is within normal limits for size. The visualized bony structures of the thorax show no acute abnormality. IMPRESSION: No active cardiopulmonary disease. Electronically Signed   By: Misty Stanley M.D.   On: 01/01/2021 08:41    Procedures Procedures   Medications Ordered in ED Medications  methylPREDNISolone sodium succinate (SOLU-MEDROL) 125 mg/2 mL injection 125 mg (125 mg Intravenous Not Given 01/01/21 1246)  albuterol (VENTOLIN HFA) 108 (90 Base) MCG/ACT inhaler 2 puff (has no administration in time range)  AeroChamber Plus Flo-Vu Medium MISC 1 each (has no administration in time range)  albuterol (PROVENTIL) (2.5 MG/3ML) 0.083% nebulizer solution 5 mg (5 mg Nebulization Given 01/01/21 1247)  hydrALAZINE (APRESOLINE) injection 10 mg (10 mg Intravenous Given 01/01/21 1539)  cloNIDine (CATAPRES) tablet 0.1 mg (0.1 mg Oral Given 01/01/21 1538)  albuterol (PROVENTIL) (2.5 MG/3ML) 0.083% nebulizer solution 5 mg (5 mg Nebulization Given 01/01/21 1801)    ED Course  I have reviewed the triage vital signs and the nursing notes.  Pertinent labs & imaging results that were available during my care of the patient were reviewed  by me and considered in my medical decision making (see chart for details).  Clinical Course as of 01/01/21 1926  Sat Jan 01, 2021  1447 Chest x-Felina Tello reviewed and interpretation reviewed with mild hyperexpansion no evidence of acute infiltrate [DR]  1516 Patient with oxygen saturations 97% on room air.  Continues hypertensive.  Given hydralazine and clonidine now.  Awaiting BMP Chest x-Namine Beahm is clear [DR]  Clinical Course User Index [DR] Pattricia Boss, MD   MDM Rules/Calculators/A&P                           Patient presents today with some increased dyspnea.  Chest x-Uziel Covault is clear.  He received Solu-Medrol and albuterol with improvement of his symptoms.  He was hypertensive and received hydralazine and clonidine.  Patient has been on BiPAP but his BiPAP was removed for exchange and it was not returned.  I have called the home health agency.  They are unable to replace until Monday.  Patient is improved here and will be discharged home on prednisone and is given albuterol inhaler to use as he feels this works better than his nebulizer at home.  He has an inhaler but does not have a spacer.  He will also be given a spacer.  Oxygen saturations have remained normal.  He was initially had decreased air movement but this has improved greatly here with his albuterol and Solu-Medrol treatments.  He appears stable for discharge to home.  We have discussed return precautions and need for follow-up and he voices understanding. Final Clinical Impression(s) / ED Diagnoses Final diagnoses:  Dyspnea, unspecified type  COPD exacerbation ([place])    Rx / DC Orders ED Discharge Orders          Ordered    predniSONE (DELTASONE) 10 MG tablet  Daily        01/01/21 1924             Pattricia Boss, MD 01/01/21 1926

## 2021-01-01 NOTE — ED Notes (Signed)
Pt given graham crackers per request.

## 2021-01-01 NOTE — ED Notes (Signed)
Pt and Pt's daughter report his breathing gets worse and he has a hard time catching his breath when he falls asleep.  Daughter sts "it's like he gets anxious and can't catch his breath."  Pt admits to having a Hx of sleep apnea and does not have a CPAP machine.  Sts "they were supposed to be contacting me about getting a different machine and never did."

## 2021-01-01 NOTE — ED Notes (Signed)
Pt given graham crackers and ginger ale ?

## 2021-01-01 NOTE — ED Notes (Signed)
Pt provided discharge instructions and prescription information. Pt was given the opportunity to ask questions and questions were answered. Discharge signature not obtained in the setting of the COVID-19 pandemic in order to reduce high touch surfaces.  ° °

## 2021-01-01 NOTE — ED Notes (Signed)
Pt placed on 2L [place] by EMS and triage staff for comfort.  Pt remained at 98% RA.  Pt's family reports O2 drops w/ exertion.

## 2021-01-01 NOTE — ED Notes (Signed)
Lab aware and adding on BNP.

## 2021-01-01 NOTE — ED Triage Notes (Addendum)
Pt to triage via [facility] from home.  Hx of COPD.  C/o SOB with wheezing since yesterday that is worse this morning.  Pt did 2 home Albuterol nebs and used inhaler without relief.  Denies chest pain.  States he is feeling better with O2 via [place].   EMS- 20g LAC Mag 2 grams Solu-medrol 125 mg 2 liters  BP 200/112

## 2021-01-01 NOTE — ED Notes (Signed)
Pt noted to be less labored after neb treatment.  Will keep Pt off O2 and monitor Sat.

## 2021-01-11 ENCOUNTER — Ambulatory Visit (INDEPENDENT_AMBULATORY_CARE_PROVIDER_SITE_OTHER): Payer: Medicaid Other | Admitting: Cardiovascular Disease

## 2021-01-11 ENCOUNTER — Encounter: Payer: Self-pay | Admitting: Cardiovascular Disease

## 2021-01-11 ENCOUNTER — Other Ambulatory Visit: Payer: Self-pay

## 2021-01-11 VITALS — BP 136/68 | HR 68 | Ht 72.0 in | Wt 155.4 lb

## 2021-01-11 DIAGNOSIS — G4733 Obstructive sleep apnea (adult) (pediatric): Secondary | ICD-10-CM | POA: Diagnosis not present

## 2021-01-11 DIAGNOSIS — I10 Essential (primary) hypertension: Secondary | ICD-10-CM

## 2021-01-11 DIAGNOSIS — J441 Chronic obstructive pulmonary disease with (acute) exacerbation: Secondary | ICD-10-CM | POA: Diagnosis not present

## 2021-01-11 DIAGNOSIS — N1832 Chronic kidney disease, stage 3b: Secondary | ICD-10-CM

## 2021-01-11 DIAGNOSIS — B20 Human immunodeficiency virus [HIV] disease: Secondary | ICD-10-CM

## 2021-01-11 DIAGNOSIS — Z720 Tobacco use: Secondary | ICD-10-CM

## 2021-01-11 DIAGNOSIS — G4731 Primary central sleep apnea: Secondary | ICD-10-CM | POA: Diagnosis not present

## 2021-01-11 DIAGNOSIS — Z21 Asymptomatic human immunodeficiency virus [HIV] infection status: Secondary | ICD-10-CM

## 2021-01-11 DIAGNOSIS — I5033 Acute on chronic diastolic (congestive) heart failure: Secondary | ICD-10-CM

## 2021-01-11 NOTE — Patient Instructions (Signed)
Medication Instructions:  Your Physician recommend you continue on your current medication as directed.    *If you need a refill on your cardiac medications before your next appointment, please call your pharmacy*   Lab Work: None ordered today   Testing/Procedures: None ordered today   Follow-Up: At [facility], you and your health needs are our priority.  As part of our continuing mission to provide you with exceptional heart care, we have created designated Provider Care Teams.  These Care Teams include your primary Cardiologist (physician) and Advanced Practice Providers (APPs -  Physician Assistants and Nurse Practitioners) who all work together to provide you with the care you need, when you need it.  We recommend signing up for the patient portal called "MyChart".  Sign up information is provided on this After Visit Summary.  MyChart is used to connect with patients for Virtual Visits (Telemedicine).  Patients are able to view lab/test results, encounter notes, upcoming appointments, etc.  Non-urgent messages can be sent to your provider as well.   To learn more about what you can do with MyChart, go to [email or web address].    Your next appointment:   3-4 month(s)  The format for your next appointment:   In Person  Provider:   Shelva Majestic, MD

## 2021-01-11 NOTE — Progress Notes (Signed)
Cardiology Office Note    Date:  01/22/2021   ID:  ROCIO WOLAK, DOB 1955-03-19, MRN [id number]  PCP:  Gifford Shave, MD  Cardiologist:  Shelva Majestic, MD   Initial sleep office visit  History of Present Illness:  Kevin [name] is a 65 y.o. male who has seen Dr. Debara Pickett in the past for cardiology care.  The patient has a history of COPD, CKD, diastolic dysfunction, HIV, peripheral vascular disease, paroxysmal atrial fibrillation as well as tobacco use.  He had recently been evaluated on January 01, 2021 in the emergency room for increasing shortness of breath and cough.  Previously had seen Dr. Bjorn Loser of pulmonary in September 2022 dyspnea on exertion and COPD at which time he was hypertensive.  Apparently, in 2018, the patient was referred for a sleep study by Dr. Debara Pickett and [name].  He had a history of atrial fibrillation, daytime sleepiness and snoring.  His initial diagnostic study on March 03, 2016 showed moderate severe sleep apnea with an AHI of 27.6, RDI 38.7 which was very severe with supine position AHI at 74.0.  He also was found to have moderate central sleep apnea with a CAI of 24.6.  He subsequently underwent a titration study on April 10, 2016 for a trial of BiPAP auto with an EPAP minimum of 8 pressure support of 4 with IPAP max of 20 was recommended.  He was noted to have central sleep apnea during that evaluation.  Apparently I never saw him in the office in follow-up of his studies.  He did not tolerate CPAP and apparently did not use it.  In March 2021 due to continued poor sleep he was referred for a split-night study which again showed severe sleep apnea with an AHI of 62.9, RDI 7.2.  BiPAP was titrated to 18 cm of water.  The patient continued to have frequent central events throughout titration and as result ASV titration was recommended.  He had severe central apnea during the diagnostic portion of the study with a CAI of 58.5.  On June 10, 2019 he was  referred for adaptive servo ventilation titration.  It was recommended that he have a trial of SV advanced with EPAP min of 7, pressure support of 5 with a range up to 15 and maximum pressure of 20 cm of water with an auto breath rate.  Apparently, I again had never seen the patient following the study.  He is now in need of a face-to-face evaluation to allow for him to get his ASV treatment.  Presently he sleeps very poorly.  He had recently gone to the emergency room with increasing shortness of breath and was hypertensive.  He has to sleep in a chair.  He typically goes to bed around 1130 and often time is awake by 1230 or 1 AM.  He cannot sleep for the remainder of the night.  He wakes up gasping for breath and has loud snoring.  An Epworth Sleepiness Scale score was calculated in the office today and this endorsed at 11 consistent with excessive daytime sleepiness.  He is scheduled to undergo pulmonary function testing at [facility] pulmonary care and will be followed up by Dr. Leslye Peer.  He currently is on amlodipine 5 mg, hydralazine 50 mg 3 times a day and carvedilol 6.25 mg twice a day for hypertension.  He is on Dulera and albuterol in addition to prednisone for his COPD.  He is on Biktarvy for HIV.  His most  recent echo Doppler study in November 30, 2020 showed an EF of 55 to 60% with grade 1 diastolic dysfunction and mild LVH.  There was no significant valvular abnormalities.  He presents for his initial evaluation with me.   Past Medical History:  Diagnosis Date   AIDS (acquired immune deficiency syndrome) ([place]) 08/17/2016   Chronic diastolic CHF (congestive heart failure), NYHA class 3 (HCC) 01/2016   Chronic lower back pain    CKD (chronic kidney disease), stage III (HCC)    COPD (chronic obstructive pulmonary disease) ([place])    Gout    "forearms, hands, ankles, feet" (06/05/2016)   Headache    "weekly" (06/05/2016)   Heart murmur    Hypertension    Hypertensive crisis 08/15/2017   OSA  on CPAP    PAD (peripheral artery disease) (HCC)    PAF (paroxysmal atrial fibrillation) ([place]) 01/2016    Past Surgical History:  Procedure Laterality Date   LEFT HEART CATH AND CORONARY ANGIOGRAPHY N/A 10/23/2016   Procedure: LEFT HEART CATH AND CORONARY ANGIOGRAPHY;  Surgeon: Leonie Man, MD;  Location: [place] [facility];  Service: Cardiovascular;  Laterality: N/A;   LOWER EXTREMITY ANGIOGRAPHY N/A 07/17/2016   Procedure: Lower Extremity Angiography;  Surgeon: Lorretta Harp, MD;  Location: [facility];  Service: Cardiovascular;  Laterality: N/A;   LOWER EXTREMITY INTERVENTION N/A 06/05/2016   Procedure: Lower Extremity Intervention;  Surgeon: Lorretta Harp, MD;  Location: [place] [facility];  Service: Cardiovascular;  Laterality: N/A;   PERIPHERAL VASCULAR ATHERECTOMY Right 07/17/2016   Procedure: Peripheral Vascular Atherectomy;  Surgeon: Lorretta Harp, MD;  Location: [place] [facility];  Service: Cardiovascular;  Laterality: Right;  SFA   PERIPHERAL VASCULAR INTERVENTION  06/05/2016   Procedure: Peripheral Vascular Intervention;  Surgeon: Lorretta Harp, MD;  Location: [facility];  Service: Cardiovascular;;  left SFA    Current Medications: Outpatient Medications Prior to Visit  Medication Sig Dispense Refill   albuterol (PROVENTIL) (2.5 MG/3ML) 0.083% nebulizer solution Take 3 mLs (2.5 mg total) by nebulization every 6 (six) hours as needed for wheezing or shortness of breath. 90 mL 0   albuterol (VENTOLIN HFA) 108 (90 Base) MCG/ACT inhaler Inhale 2 puffs into the lungs every 6 (six) hours as needed for wheezing or shortness of breath. 18 g 0   amLODipine (NORVASC) 5 MG tablet Take 5 mg by mouth daily. Take 1 tablet by mouth daily.     bictegravir-emtricitabine-tenofovir AF (BIKTARVY) 50-200-25 MG TABS tablet Take 1 tablet by mouth daily. 30 tablet 11   carvedilol (COREG) 6.25 MG tablet Take 1 tablet (6.25 mg total) by mouth 2 (two) times daily with a meal.  (Patient taking differently: Take 6.25 mg by mouth daily.) 60 tablet 0   hydrALAZINE (APRESOLINE) 50 MG tablet Take 1 tablet (50 mg total) by mouth 3 (three) times daily. 90 tablet 0   mometasone-formoterol (DULERA) 100-5 MCG/ACT AERO Inhale 2 puffs into the lungs every morning. (Patient taking differently: Inhale 2 puffs into the lungs daily as needed for wheezing or shortness of breath.) 13 g 0   Multiple Vitamin (MULTIVITAMIN) tablet Take 1 tablet by mouth daily.     predniSONE (DELTASONE) 10 MG tablet Take 2 tablets (20 mg total) by mouth daily. 10 tablet 0   amLODipine (NORVASC) 10 MG tablet Take 1 tablet (10 mg total) by mouth daily. (Patient not taking: Reported on 01/01/2021) 30 tablet 0   No facility-administered medications prior to visit.  Allergies:   Patient has no known allergies.   Social History   Socioeconomic History   Marital status: Divorced    Spouse name: Not on file   Number of children: Not on file   Years of education: Not on file   Highest education level: Not on file  Occupational History   Not on file  Tobacco Use   Smoking status: Every Day    Packs/day: 0.50    Years: 42.00    Pack years: 21.00    Types: Cigarettes   Smokeless tobacco: Never   Tobacco comments:    4 a day  Vaping Use   Vaping Use: Never used  Substance and Sexual Activity   Alcohol use: Yes    Alcohol/week: 2.0 standard drinks    Types: 2 Cans of beer per week   Drug use: No   Sexual activity: Not Currently    Comment: offered condoms  Other Topics Concern   Not on file  Social History Narrative   Not on file   Social Determinants of Health   Financial Resource Strain: Not on file  Food Insecurity: Not on file  Transportation Needs: Not on file  Physical Activity: Not on file  Stress: Not on file  Social Connections: Not on file    Socially, he is separated from his wife.  He had not worked in 4 months.  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  There is a tobacco history of at least 40 years.   Family History:  The patient's family history includes High blood pressure in his mother; Lupus in his mother.   His father is 32.  Mother died at age 51 with cancer.  He has 8 siblings.  1 brother died with cancer.  He has 5 children.  ROS General: Negative; No fevers, chills, or night sweats;  HEENT: Negative; No changes in vision or hearing, sinus congestion, difficulty swallowing Pulmonary: COPD Cardiovascular: Negative; No chest pain, presyncope, syncope, palpitations GI: Negative; No nausea, vomiting, diarrhea, or abdominal pain GU: Negative; No dysuria, hematuria, or difficulty voiding Musculoskeletal: Negative; no myalgias, joint pain, or weakness Hematologic/Oncology: HIV Endocrine: Negative; no heat/cold intolerance; no diabetes Neuro: Negative; no changes in balance, headaches Skin: Negative; No rashes or skin lesions Psychiatric: Negative; No behavioral problems, depression Sleep: Severe obstructive sleep apnea, with both obstructive and central events, not on therapy.  Very poor sleep, snoring, frequent awakenings, gasping for breath; no restless legs, hypnogognic hallucinations, no cataplexy Other comprehensive 14 point system review is negative.   PHYSICAL EXAM:   VS:  BP 136/68   Pulse 68   Ht 6' (1.829 m)   Wt 155 lb 6.4 oz (70.5 kg)   SpO2 98%   BMI 21.08 kg/m     Repeat blood pressure by me was 124/70  Wt Readings from Last 3 Encounters:  01/11/21 155 lb 6.4 oz (70.5 kg)  12/03/20 145 lb 6.4 oz (66 kg)  11/02/20 154 lb 3.2 oz (69.9 kg)    General: Alert, oriented, no distress.  Skin: normal turgor, no rashes, warm and dry HEENT: Normocephalic, atraumatic. Pupils equal round and reactive to light; sclera anicteric; extraocular muscles intact; Nose without nasal septal hypertrophy Mouth/Parynx benign; Mallinpatti scale 2 Neck: No JVD, no carotid bruits; normal carotid upstroke Lungs: Slightly  decreased breath sounds.  No audible wheezing Chest wall: without tenderness to palpitation Heart: PMI not displaced, RRR, s1 s2 normal, 1/6 systolic murmur, no  diastolic murmur, no rubs, gallops, thrills, or heaves Abdomen: soft, nontender; no hepatosplenomehaly, BS+; abdominal aorta nontender and not dilated by palpation. Back: no CVA tenderness Pulses 2+ Musculoskeletal: full range of motion, normal strength, no joint deformities Extremities: no clubbing cyanosis or edema, Homan's sign negative  Neurologic: grossly nonfocal; Cranial nerves grossly wnl Psychologic: Normal mood and affect   Studies/Labs Reviewed:   EKG:  EKG is ordered today.  ECG (independently read by me):  Sinus rhythm, PVCs, T wave abnormality inferolaterally  Recent Labs: BMP Latest Ref Rng & Units 01/01/2021 12/03/2020 12/02/2020  Glucose 70 - 99 mg/dL 119(H) 81 122(H)  BUN 8 - 23 mg/dL 20 43(H) 41(H)  Creatinine 0.61 - 1.24 mg/dL 1.61(H) 2.25(H) 2.38(H)  BUN/Creat Ratio 6 - 22 (calc) - - -  Sodium 135 - 145 mmol/L 137 138 135  Potassium 3.5 - 5.1 mmol/L 3.9 4.2 4.0  Chloride 98 - 111 mmol/L 107 109 105  CO2 22 - 32 mmol/L 21(L) 23 23  Calcium 8.9 - 10.3 mg/dL 9.1 8.2(L) 8.7(L)     Hepatic Function Latest Ref Rng & Units 11/30/2020 03/17/2019 08/15/2017  Total Protein 6.5 - 8.1 g/dL 7.9 8.1 6.6  Albumin 3.5 - 5.0 g/dL 3.3(L) - -  AST 15 - 41 U/L _0 ALT 0 - 44 U/L 15 8(L) 11  Alk Phosphatase 38 - 126 U/L 75 - -  Total Bilirubin 0.3 - 1.2 mg/dL 0.6 0.4 0.6    CBC Latest Ref Rng & Units 01/01/2021 12/01/2020 11/30/2020  WBC 4.0 - 10.5 K/uL 9.9 14.6(H) 11.4(H)  Hemoglobin 13.0 - 17.0 g/dL 12.4(L) 13.0 12.6(L)  Hematocrit 39.0 - 52.0 % 37.7(L) 37.6(L) 36.1(L)  Platelets 150 - 400 K/uL 327 323 294   Lab Results  Component Value Date   MCV 93.5 01/01/2021   MCV 88.5 12/01/2020   MCV 89.1 11/30/2020   Lab Results  Component Value Date   TSH 1.17 11/02/2020   Lab Results  Component Value Date    HGBA1C 4.9 11/30/2020     BNP    Component Value Date/Time   BNP 22.7 01/01/2021 0808    ProBNP No results found for: PROBNP   Lipid Panel     Component Value Date/Time   CHOL 177 04/04/2019 0458   TRIG 53 12/01/2020 0249   HDL 43 04/04/2019 0458   CHOLHDL 4.1 04/04/2019 0458   VLDL 41 (H) 04/04/2019 0458   LDLCALC 93 04/04/2019 0458   LDLCALC 138 (H) 03/17/2019 1359     RADIOLOGY: DG Chest 2 View  Result Date: 01/01/2021 CLINICAL DATA:  Shortness of breath. EXAM: CHEST - 2 VIEW COMPARISON:  12/01/2020 FINDINGS: Mild hyperexpansion. The lungs are clear without focal pneumonia, edema, pneumothorax or pleural effusion. The cardiopericardial silhouette is within normal limits for size. The visualized bony structures of the thorax show no acute abnormality. IMPRESSION: No active cardiopulmonary disease. Electronically Signed   By: Misty Stanley M.D.   On: 01/01/2021 08:41     Additional studies/ records that were reviewed today include:   Echo 04/04/19: IMPRESSIONS    1. Left ventricular ejection fraction, by estimation, is 55 to 60%. The  left ventricle has normal function. The left ventricle has no regional  wall motion abnormalities. There is mild concentric left ventricular  hypertrophy. Left ventricular diastolic  parameters are consistent with Grade I diastolic dysfunction (impaired  relaxation).   2. Right ventricular systolic function is normal. The right ventricular  size is normal. Tricuspid regurgitation  signal is inadequate for assessing  PA pressure.   3. The mitral valve is grossly normal. No evidence of mitral valve  regurgitation.   4. The aortic valve is tricuspid. Aortic valve regurgitation is not  visualized. No aortic stenosis is present.   5. The inferior vena cava is normal in size with greater than 50%  respiratory variability, suggesting right atrial pressure of 3 mmHg.    LHC 10/23/16:  Normal coronary arteries.  Mildly elevated LVEDP.    Lexiscan Myoview 04/04/19: IMPRESSION: 1. Large fixed inferior wall defect extending to the apex. Apical component appears larger on stress imaging suspicious for small amount of apical peri-infarct inducible ischemia.   2. Septal hypokinesis.   3. Left ventricular ejection fraction 50%   4. Non invasive risk stratification*: Intermediate  ECHO: 11/30/2020 IMPRESSIONS   1. Left ventricular ejection fraction, by estimation, is 55 to 60%. The  left ventricle has normal function. The left ventricle has no regional  wall motion abnormalities. There is mild concentric left ventricular  hypertrophy. Left ventricular diastolic  parameters are consistent with Grade I diastolic dysfunction (impaired  relaxation).   2. Right ventricular systolic function is normal. The right ventricular  size is normal.   3. The mitral valve is normal in structure. No evidence of mitral valve  regurgitation. No evidence of mitral stenosis.   4. The aortic valve is normal in structure. Aortic valve regurgitation is  not visualized. No aortic stenosis is present.   5. The inferior vena cava is normal in size with greater than 50%  respiratory variability, suggesting right atrial pressure of 3 mmHg.   ------------------------------------------------------------------------------------------------------  04/10/2016 CLINICAL INFORMATION The patient is referred for a BiPAP titration to treat sleep apnea.   Date of NPSG:  03/03/2016:  AHI 27.6/h; RDI 38.7/h; AHI with supine sleep 74.0/h;  there was moderate central apnea at 24.6/h.   SLEEP STUDY TECHNIQUE As per the AASM Manual for the Scoring of Sleep and Associated Events v2.3 (April 2016) with a hypopnea requiring 4% desaturations.   The channels recorded and monitored were frontal, central and occipital EEG, electrooculogram (EOG), submentalis EMG (chin), nasal and oral airflow, thoracic and abdominal wall motion, anterior tibialis EMG, snore microphone,  electrocardiogram, and pulse oximetry. Bilevel positive airway pressure (BPAP) was initiated at the beginning of the study and titrated to treat sleep-disordered breathing.   MEDICATIONS acetaminophen (TYLENOL) 500 MG tablet aspirin EC 325 MG EC tablet CARTIA XT 180 MG 24 hr capsule carvedilol (COREG) 6.25 MG tablet DULERA 100-5 MCG/ACT AERO furosemide (LASIX) 40 MG tablet hydrALAZINE (APRESOLINE) 50 MG tablet HYDROcodone-acetaminophen (NORCO) 10-325 MG tablet isosorbide mononitrate (IMDUR) 30 MG 24 hr tablet potassium chloride (K-DUR) 10 MEQ tablet predniSONE (DELTASONE) 10 MG tablet PROAIR HFA 108 (90 Base) MCG/ACT inhaler SPIRIVA HANDIHALER 18 MCG inhalation capsule   Medications self-administered by patient taken the night of the study : N/A   RESPIRATORY PARAMETERS Optimal IPAP Pressure (cm): 12        AHI at Optimal Pressure (/hr) 3.2 Optimal EPAP Pressure (cm):            8                       Overall Minimal O2 (%):         92.00   Minimal O2 at Optimal Pressure (%): 95.0  SLEEP ARCHITECTURE Start Time:      11:07:44 PM    Stop Time:  5:10:59 AM      Total Time (min):         363.2   Total Sleep Time (min):      250.4 Sleep Latency (min):   2.8       Sleep Efficiency (%):   68.9     REM Latency (min):    237.0   WASO (min):  110.0 Stage N1 (%): 21.16   Stage N2 (%): 71.25   Stage N3 (%): 0.00     Stage R (%):   7.59 Supine (%):     58.19   Arousal Index (/hr):     40.7                                           CARDIAC DATA The 2 lead EKG demonstrated sinus rhythm. The mean heart rate was 59.55 beats per minute. Other EKG findings include: None.   LEG MOVEMENT DATA The total Periodic Limb Movements of Sleep (PLMS) were 0. The PLMS index was 0.00. A PLMS index of <15 is considered normal in adults.   IMPRESSIONS - CPAP titration was initiated at 5 cm water pressure and increased to 11 cm water pressure.  The patient had frequent central apneic events with CPAP.  He  ultimately was switched to BiPAP 12/8 with significant improvement but due to time constraints was not able to be titrated.  AHI at 12/8 was 3.2 with an RDI of 9.5.  The patient had one central event at 12/8, which was significantly improved from CPAP therapy. - Moderate Central Sleep Apnea was noted during this titration (CAI = 19.2/h). - Significant oxygen desaturations were not observed during this titration (min O2 = 92.00%). - The patient snored with Moderate snoring volume. - No cardiac abnormalities were observed during this study. - Clinically significant periodic limb movements were not noted during this study. Arousals associated with PLMs were rare.   DIAGNOSIS - Obstructive Sleep Apnea (327.23 [G47.33 ICD-10]) - Central Sleep Apnea    RECOMMENDATIONS - Recommend an initial trial of BiPAP Auto with B-Flex or EPR therapy  with an EPAP min of 8, delta of 4 and IPAP max of 20 cm H2O with heated humidification.  A Large size Resmed Nasal Pillow Mask AirFit P10 mask was used for this titration. - Effort should be made to optimize nasal and oral pharyngeal patency. - Avoid alcohol, sedatives and other CNS depressants that may worsen sleep apnea and disrupt normal sleep architecture. - Sleep hygiene should be reviewed to assess factors that may improve sleep quality. - Weight management and regular exercise should be initiated or continued. - A download should be obtained in 30 days and sleep clinic evaluation after 4 weeks of therapy.    05/07/2019 CLINICAL INFORMATION The patient is referred for a split night study with BPAP.   MEDICATIONS albuterol (PROVENTIL) (2.5 MG/3ML) 0.083% nebulizer solution  albuterol (VENTOLIN HFA) 108 (90 Base) MCG/ACT inhaler  amLODipine (NORVASC) 5 MG tablet  aspirin EC 81 MG EC tablet  bictegravir-emtricitabine-tenofovir AF (BIKTARVY) 50-200-25 MG TABS tablet  carvedilol (COREG) 6.25 MG tablet  hydrALAZINE (APRESOLINE) 50 MG tablet   mometasone-formoterol (DULERA) 100-5 MCG/ACT AERO  potassium chloride (K-DUR) 10 MEQ tablet  pregabalin (LYRICA) 150 MG capsule  rosuvastatin (CRESTOR) 20 MG tablet  traZODone (DESYREL) 100 MG tablet   Medications self-administered by patient taken the night of the study :  N/A   SLEEP STUDY TECHNIQUE As per the AASM Manual for the Scoring of Sleep and Associated Events v2.3 (April 2016) with a hypopnea requiring 4% desaturations.   The channels recorded and monitored were frontal, central and occipital EEG, electrooculogram (EOG), submentalis EMG (chin), nasal and oral airflow, thoracic and abdominal wall motion, anterior tibialis EMG, snore microphone, electrocardiogram, and pulse oximetry. Bi-level positive airway pressure (BiPAP) was initiated when the patient met split night criteria and was titrated according to treat sleep-disordered breathing.   RESPIRATORY PARAMETERS Diagnostic Total AHI (/hr):            62.9     RDI (/hr):         70.2     OA Index (/hr):            4.4       CA Index (/hr):      58.5 REM AHI (/hr):            6.7       NREM AHI (/hr):          67.4     Supine AHI (/hr):         N/A      Non-supine AHI (/hr):        70.24 Min O2 Sat (%):          92.0     Mean O2 (%):  96.0     Time below 88% (min):           0             Titration Optimal IPAP Pressure (cm):  Optimal EPAP Pressure (cm):             AHI at Optimal Pressure (/hr):            N/A      Min O2 at Optimal Pressure (%):       93.0 Sleep % at Optimal (%):         N/A      Supine % at Optimal (%):       N/A         SLEEP ARCHITECTURE The study was initiated at 8:34:53 AM and terminated at 2:42:38 PM. The total recorded time was 367.8 minutes. EEG confirmed total sleep time was 246 minutes yielding a sleep efficiency of 66.9%%. Sleep onset after lights out was 8.6 minutes with a REM latency of 59.5 minutes. The patient spent 30.7%% of the night in stage N1 sleep, 60.0%% in stage N2 sleep, 0.0%% in stage  N3 and 9.4% in REM. Wake after sleep onset (WASO) was 113.1 minutes. The Arousal Index was 58.5/hour.   LEG MOVEMENT DATA The total Periodic Limb Movements of Sleep (PLMS) were 0. The PLMS index was 0.0 .   CARDIAC DATA The 2 lead EKG demonstrated sinus rhythm. The mean heart rate was 100.0 beats per minute. Other EKG findings include: PVCs.   IMPRESSIONS - Severe obstructive sleep apnea occurred during the diagnostic portion of the study (AHI 62.9 /h; RDI 70.2/h). BiPAP was started at 8/4 and was titrated to 18/14 cm of water. Patient had frequent central events throughout the titration. AHI at 18/14 was 80.4/h.  Due to predominant central events recommend an  ASV titration study.  - Severe central sleep apnea occurred during the diagnostic portion of the study (CAI = 58.5/hour). - The patient did not have oxygen desaturation during the diagnostic portion of the study (Min O2 = 92.0%) -  The patient snored with soft snoring volume during the diagnostic portion of the study. - EKG findings include PVCs. - Clinically significant periodic limb movements of sleep did not occur during the study.   DIAGNOSIS - Obstructive Sleep Apnea (327.23 [G47.33 ICD-10]) - Central sleep apnea   RECOMMENDATIONS - Due to predominant central events in this patient with normal LV function (EF 55-60% on echo 04/04/2019), recommend an expeditious ASV titration study. - Effort should be made to optimize nasal and oropharyngeal patency and volume status.  - Avoid alcohol, sedatives and other CNS depressants that may worsen sleep apnea and disrupt normal sleep architecture. - Sleep hygiene should be reviewed to assess factors that may improve sleep quality. - Weight management and regular exercise should be initiated or continued. - Return to Sleep Center for re-evaluation.    06/10/2019 CLINICAL INFORMATION The patient is referred for a adaptive servo-ventilator titration study. Most recent titration study dated  05/07/2019 revealed an AHI of 62.9/h and RDI 70.2/h. Pt failed BIPAP titration with frequent central events up to 18/14.   MEDICATIONS albuterol (PROVENTIL) (2.5 MG/3ML) 0.083% nebulizer solution  albuterol (VENTOLIN HFA) 108 (90 Base) MCG/ACT inhaler  amLODipine (NORVASC) 5 MG tablet  aspirin EC 81 MG EC tablet  bictegravir-emtricitabine-tenofovir AF (BIKTARVY) 50-200-25 MG TABS tablet  carvedilol (COREG) 6.25 MG tablet  hydrALAZINE (APRESOLINE) 50 MG tablet  mometasone-formoterol (DULERA) 100-5 MCG/ACT AERO  potassium chloride (K-DUR) 10 MEQ tablet  pregabalin (LYRICA) 150 MG capsule  rosuvastatin (CRESTOR) 20 MG tablet  traZODone (DESYREL) 100 MG tablet   Medications self-administered by patient taken the night of the study : N/A   SLEEP STUDY TECHNIQUE As per the AASM Manual for the Scoring of Sleep and Associated Events v2.3 (April 2016) with a hypopnea requiring 4% desaturations.   The channels recorded and monitored were frontal, central and occipital EEG, electrooculogram (EOG), submentalis EMG (chin), nasal and oral airflow, thoracic and abdominal wall motion, anterior tibialis EMG, snore microphone, electrocardiogram, and pulse oximetry.   RESPIRATORY PARAMETERS Optimal Min IPAP (cm):          7          Optimal Max IPAP (cm):         7          Optimal Min EPAP (cm):    7          Optimal Max EPAP (cm):        7 Optimal Max Pressure (cm):   15        Optimal Min PS (cm):  5          Optimal Max PS (cm): 15            Opitmal Breathing Rate (/min):           Auto Overall Min O2 (%):    92.0     Min O2 at Optimal Pressure (%):       92.0     AHI at Optimal (/hr):    N/A         SLEEP ARCHITECTURE During a recording time of 358 minutes, the patient slept for 285 minutes. Sleep efficiency was 79.6%%. The patient spent 21.1%% of the night in stage N1 sleep, 57.9%% in stage N2 sleep, 0.0%% in stage N3 and 21.1% in REM. Wake after sleep onset (WASO) was 68.5 minutes. Alpha intrusion  was absent. Supine sleep was 4.39%. The arousal index was 24.2.   LEG MOVEMENT DATA PLM Index (/hr):  84.6     PLM Arousal Index (/hr):         0.6   CARDIAC DATA The 2 lead EKG demonstrated sinus rhythm. The mean heart rate was 52.8 beats per minute. Other EKG findings include: PVCs.   IMPRESSIONS - No significant obstructive sleep apnea occurred during this study (AHI = 1.5/hour). An optimal ASV pressure was selected. - No significant central sleep apnea occurred during this study (CAI = 0.6). - The patient had minimal or no oxygen desaturation during the study (Min O2 = 92.0) - No snoring was audible during this study. - EKG findings include PVCs. - Clinically significant periodic limb movements did not occur during sleep.   DIAGNOSIS - Obstructive Sleep Apnea (327.23 [G47.33 ICD-10])   RECOMMENDATIONS - Trial of SV Advance EPAP Min 7 and Max 7 cmH2O, Pressure Support Min 5 and Max 15 cmH2O with Max Pressure of 20 cmH2O and Breath Rate of Auto BrPM. - Effort should be made to optimize nasal and orpharyngeal patency. - Avoid alcohol, sedatives and other CNS depressants that may worsen sleep apnea and disrupt normal sleep architecture. - Sleep hygiene should be reviewed to assess factors that may improve sleep quality. - Weight management and regular exercise should be initiated or continued. - Recommend a download in 30 days and sleep evaluation after 4 weeks of therapy.    ASSESSMENT:    1. OSA (obstructive sleep apnea)   2. Central sleep apnea   3. Acute on chronic diastolic CHF (congestive heart failure), NYHA class 3 ([place])   4. Chronic obstructive pulmonary disease with acute exacerbation ([place])   5. Essential hypertension   6. Stage 3b chronic kidney disease ([place])   7. HIV infection, unspecified symptom status ([place])   8. Tobacco abuse     PLAN:  Kevin [name] is a 65 year old gentleman who has a history of hypertension, paroxysmal atrial fibrillation, COPD,  tobacco use, HIV, PVD, CKD stage IIIb, and has been diagnosed as having severe obstructive sleep apnea initially documented in 2018.  His sleep apnea is complex with both obstructive and central events.  He apparently did not tolerate BiPAP therapy.  I had never seen him until this evaluation.  I reviewed his prior sleep studies.  His most recent evaluation was an ASV titration on June 10, 2019.  On that study he had significant benefit after failing previous BiPAP therapy and with ASV there was no significant residual AHI (1.6/h) or CAI (0.6) events.  Apparently he never initiated therapy and needed a face-to-face evaluation for initiation of treatment.  His sleep continues to be very poor and he typically is sleeping in a chair.  He is able to fall asleep, but typically 1-1/2 to 2 hours later he is up and cannot sleep for the remainder of the night.  He often awakens gasping for breath, has loud snoring, and his Epworth scale today verifies continued excessive daytime sleepiness.  His blood pressure today is stable at 124/70 current regimen of amlodipine 5 mg and hydralazine 50 mg 3 times a day in addition to carvedilol 6.25 mg twice daily.  He is on albuterol and Dulera for his lung disease.  He continues to be on Biktarvy for his HIV.  His ECG shows sinus rhythm.  He has a history of paroxysmal atrial fibrillation.  His echo Doppler study has continued to show normal systolic function with mild LVH and grade 1 diastolic dysfunction.  We will try to expedite his obtaining a new machine  and initiate therapy.  I had a lengthy discussion with him in the office today since this was my initial evaluation and I discussed with him at length the effects of untreated sleep apnea recurrent with regards to both blood pressure control, potential for nocturnal arrhythmias including increased risk for recurrent atrial fibrillation, effects on inflammation, glucose, GERD, and potential for nocturnal hypoxemia contributing to  both cardiac as well as cerebrovascular ischemia.  His LV function is normal.  He should benefit from ASV therapy.  I will see him in follow-up of him obtaining a new machine and further recommendations will be made at that time.   Medication Adjustments/Labs and Tests Ordered: Current medicines are reviewed at length with the patient today.  Concerns regarding medicines are outlined above.  Medication changes, Labs and Tests ordered today are listed in the Patient Instructions below. Patient Instructions  Medication Instructions:  Your Physician recommend you continue on your current medication as directed.    *If you need a refill on your cardiac medications before your next appointment, please call your pharmacy*   Lab Work: None ordered today   Testing/Procedures: None ordered today   Follow-Up: At [facility], you and your health needs are our priority.  As part of our continuing mission to provide you with exceptional heart care, we have created designated Provider Care Teams.  These Care Teams include your primary Cardiologist (physician) and Advanced Practice Providers (APPs -  Physician Assistants and Nurse Practitioners) who all work together to provide you with the care you need, when you need it.  We recommend signing up for the patient portal called "MyChart".  Sign up information is provided on this After Visit Summary.  MyChart is used to connect with patients for Virtual Visits (Telemedicine).  Patients are able to view lab/test results, encounter notes, upcoming appointments, etc.  Non-urgent messages can be sent to your provider as well.   To learn more about what you can do with MyChart, go to [email or web address].    Your next appointment:   3-4 month(s)  The format for your next appointment:   In Person  Provider:   Shelva Majestic, MD      Signed, Shelva Majestic, MD  01/22/2021 10:28 AM    [facility] [address], [place], [place], [place]  [address] Phone: [phone]

## 2021-01-22 ENCOUNTER — Encounter: Payer: Self-pay | Admitting: Cardiovascular Disease

## 2021-01-27 ENCOUNTER — Ambulatory Visit: Payer: Medicaid Other | Admitting: Student

## 2021-02-08 ENCOUNTER — Encounter (HOSPITAL_COMMUNITY): Payer: Self-pay

## 2021-02-08 ENCOUNTER — Other Ambulatory Visit: Payer: Self-pay | Admitting: Internal Medicine

## 2021-02-08 ENCOUNTER — Ambulatory Visit (HOSPITAL_COMMUNITY)
Admission: EM | Admit: 2021-02-08 | Discharge: 2021-02-08 | Disposition: A | Discharge status: Home / Self Care | Payer: Medicaid Other | Attending: Student | Admitting: Student

## 2021-02-08 DIAGNOSIS — B20 Human immunodeficiency virus [HIV] disease: Secondary | ICD-10-CM

## 2021-02-08 DIAGNOSIS — J441 Chronic obstructive pulmonary disease with (acute) exacerbation: Secondary | ICD-10-CM | POA: Diagnosis not present

## 2021-02-08 DIAGNOSIS — J09X2 Influenza due to identified novel influenza A virus with other respiratory manifestations: Secondary | ICD-10-CM

## 2021-02-08 LAB — POC INFLUENZA A AND B ANTIGEN (URGENT CARE ONLY)
INFLUENZA A ANTIGEN, POC: POSITIVE — AB
INFLUENZA B ANTIGEN, POC: NEGATIVE

## 2021-02-08 MED ORDER — PREDNISONE 10 MG (21) PO TBPK: ORAL_TABLET | Freq: Every day | ORAL | 0 refills | Status: DC

## 2021-02-08 NOTE — Discharge Instructions (Addendum)
-  You have the flu -Prednisone taper for cough/bronchitis. I recommend taking this in the morning as it could give you energy.  Avoid NSAIDs like ibuprofen and alleve while taking this medication as they can increase your risk of stomach upset and even GI bleeding when in combination with a steroid. You can continue tylenol (acetaminophen) up to 1000mg  3x daily. -Tylenol for fevers/chills, bodyaches -Continue inhalers  -With a virus, you're typically contagious for 5-7 days, or as long as you're having fevers.  -Follow-up if symptoms worsen like shortness of breath, chest pain, dizziness, weaknes

## 2021-02-08 NOTE — ED Provider Notes (Signed)
[facility]    CSN: [id number] Arrival date & time: 02/08/21  1854      History   Chief Complaint Chief Complaint  Patient presents with   Chills   Fever   Sore Throat   Cough    HPI Kevin [name] is a 65 y.o. male presenting influenza-like illness x5 days. History HIV, AIDS, COPD.  Cough productive of clear and white sputum. Two episodes of watery diarrhea today. Denies n/v/c/d. Tolerating fluids, decreased appetite. COPD- albuterol, dulera inhalers. No increase in dyspnea. breathing treatments provide relief. Temperature running 98s at home, 99 one day ago. Has attempted OTC medications including alka seltzer. HIV- compliant with medication regimen, nondetectable per pt. Here today with wife.   HPI  Past Medical History:  Diagnosis Date   AIDS (acquired immune deficiency syndrome) ([place]) 08/17/2016   Chronic diastolic CHF (congestive heart failure), NYHA class 3 (H[facility]) 01/2016   Chronic lower back pain    CKD (chronic kidney disease), stage III (H[facility])    COPD (chronic obstructive pulmonary disease) ([place])    Gout    "forearms, hands, ankles, feet" (06/05/2016)   Headache    "weekly" (06/05/2016)   Heart murmur    Hypertension    Hypertensive crisis 08/15/2017   OSA on CPAP    PAD (peripheral artery disease) (HCC)    PAF (paroxysmal atrial fibrillation) ([place]) 01/2016    Patient Active Problem List   Diagnosis Date Noted   Hypertensive crisis    Acute respiratory failure with hypoxia ([place])    Dyspnea 04/04/2019   Callus of foot 07/10/2018   Hypertensive urgency 08/15/2017   Coronary artery disease involving native coronary artery of native heart with unstable angina pectoris ([place]) 10/19/2016   Easy bruising 08/11/2016   Claudication in peripheral vascular disease ([place]) 06/05/2016   Stage 3 chronic kidney disease ([place])    Peripheral arterial disease ([place]) 05/09/2016   Acute on chronic diastolic CHF (congestive heart failure), NYHA class 3 ([place]) 03/24/2016    OSA (obstructive sleep apnea) 03/24/2016   Essential hypertension 02/18/2016   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   Shortness of breath 02/07/2016   PAF (paroxysmal atrial fibrillation) ([place]) 02/07/2016   Tobacco abuse 02/07/2016   Acute on chronic renal insufficiency 02/07/2016   COPD exacerbation ([place]) 02/07/2016   Hypertensive emergency 02/07/2016   HIV disease ([place]) 08/27/2004    Past Surgical History:  Procedure Laterality Date   LEFT HEART CATH AND CORONARY ANGIOGRAPHY N/A 10/23/2016   Procedure: LEFT HEART CATH AND CORONARY ANGIOGRAPHY;  Surgeon: Leonie Man, MD;  Location: [facility];  Service: Cardiovascular;  Laterality: N/A;   LOWER EXTREMITY ANGIOGRAPHY N/A 07/17/2016   Procedure: Lower Extremity Angiography;  Surgeon: Lorretta Harp, MD;  Location: [facility];  Service: Cardiovascular;  Laterality: N/A;   LOWER EXTREMITY INTERVENTION N/A 06/05/2016   Procedure: Lower Extremity Intervention;  Surgeon: Lorretta Harp, MD;  Location: [facility];  Service: Cardiovascular;  Laterality: N/A;   PERIPHERAL VASCULAR ATHERECTOMY Right 07/17/2016   Procedure: Peripheral Vascular Atherectomy;  Surgeon: Lorretta Harp, MD;  Location: [facility];  Service: Cardiovascular;  Laterality: Right;  SFA   PERIPHERAL VASCULAR INTERVENTION  06/05/2016   Procedure: Peripheral Vascular Intervention;  Surgeon: Lorretta Harp, MD;  Location: [facility];  Service: Cardiovascular;;  left SFA       Home Medications    Prior to Admission medications   Medication Sig Start Date End  Date Taking? Authorizing Provider  predniSONE (STERAPRED UNI-PAK 21 TAB) 10 MG (21) TBPK tablet Take by mouth daily. Take 6 tabs by mouth daily  for 2 days, then 5 tabs for 2 days, then 4 tabs for 2 days, then 3 tabs for 2 days, 2 tabs for 2 days, then 1 tab by mouth daily for 2 days 02/08/21  Yes Phillip Heal, Sherlon Handing, PA-C  albuterol (PROVENTIL) (2.5 MG/3ML) 0.083%  nebulizer solution Take 3 mLs (2.5 mg total) by nebulization every 6 (six) hours as needed for wheezing or shortness of breath. 12/03/20   Alcus Dad, MD  albuterol (VENTOLIN HFA) 108 (90 Base) MCG/ACT inhaler Inhale 2 puffs into the lungs every 6 (six) hours as needed for wheezing or shortness of breath. 12/03/20   Alcus Dad, MD  amLODipine (NORVASC) 5 MG tablet Take 5 mg by mouth daily. Take 1 tablet by mouth daily.    [provider]  bictegravir-emtricitabine-tenofovir AF (BIKTARVY) 50-200-25 MG TABS tablet Take 1 tablet by mouth daily. 10/25/20   Lamptey, Myrene Galas, MD  carvedilol (COREG) 6.25 MG tablet Take 1 tablet (6.25 mg total) by mouth 2 (two) times daily with a meal. Patient taking differently: Take 6.25 mg by mouth daily. 10/25/20   Lamptey, Myrene Galas, MD  hydrALAZINE (APRESOLINE) 50 MG tablet Take 1 tablet (50 mg total) by mouth 3 (three) times daily. 12/03/20   Alcus Dad, MD  mometasone-formoterol (DULERA) 100-5 MCG/ACT AERO Inhale 2 puffs into the lungs every morning. Patient taking differently: Inhale 2 puffs into the lungs daily as needed for wheezing or shortness of breath. 12/03/20   Alcus Dad, MD  Multiple Vitamin (MULTIVITAMIN) tablet Take 1 tablet by mouth daily.    [provider]    Family History Family History  Problem Relation Age of Onset   High blood pressure Mother    Lupus Mother     Social History Social History   Tobacco Use   Smoking status: Every Day    Packs/day: 0.50    Years: 42.00    Pack years: 21.00    Types: Cigarettes   Smokeless tobacco: Never   Tobacco comments:    4 a day  Vaping Use   Vaping Use: Never used  Substance Use Topics   Alcohol use: Yes    Alcohol/week: 2.0 standard drinks    Types: 2 Cans of beer per week   Drug use: No     Allergies   Patient has no known allergies.   Review of Systems Review of Systems  Constitutional:  Negative for appetite change, chills and fever.   HENT:  Positive for congestion. Negative for ear pain, rhinorrhea, sinus pressure, sinus pain and sore throat.   Eyes:  Negative for redness and visual disturbance.  Respiratory:  Positive for cough. Negative for chest tightness, shortness of breath and wheezing.   Cardiovascular:  Negative for chest pain and palpitations.  Gastrointestinal:  Negative for abdominal pain, constipation, diarrhea, nausea and vomiting.  Genitourinary:  Negative for dysuria, frequency and urgency.  Musculoskeletal:  Positive for myalgias.  Neurological:  Negative for dizziness, weakness and headaches.  Psychiatric/Behavioral:  Negative for confusion.   All other systems reviewed and are negative.   Physical Exam Triage Vital Signs ED Triage Vitals  Enc Vitals Group     BP 02/08/21 1937 (!) 152/76     Pulse Rate 02/08/21 1937 73     Resp 02/08/21 1937 20     Temp 02/08/21 1937 98.2 F (36.8  C)     Temp Source 02/08/21 1937 Oral     SpO2 02/08/21 1937 98 %     Weight --      Height --      Head Circumference --      Peak Flow --      Pain Score 02/08/21 1935 8     Pain Loc --      Pain Edu? --      Excl. in [place]? --    No data found.  Updated Vital Signs BP (!) 152/76 (BP Location: Left Arm)    Pulse 73    Temp 98.2 F (36.8 C) (Oral)    Resp 20    SpO2 98%   Visual Acuity Right Eye Distance:   Left Eye Distance:   Bilateral Distance:    Right Eye Near:   Left Eye Near:    Bilateral Near:     Physical Exam Vitals reviewed.  Constitutional:      General: He is not in acute distress.    Appearance: Normal appearance. He is ill-appearing.     Comments: Thin male   HENT:     Head: Normocephalic and atraumatic.     Right Ear: Tympanic membrane, ear canal and external ear normal. No tenderness. No middle ear effusion. There is no impacted cerumen. Tympanic membrane is not perforated, erythematous, retracted or bulging.     Left Ear: Tympanic membrane, ear canal and external ear normal. No  tenderness.  No middle ear effusion. There is no impacted cerumen. Tympanic membrane is not perforated, erythematous, retracted or bulging.     Nose: Nose normal. No congestion.     Mouth/Throat:     Mouth: Mucous membranes are moist.     Pharynx: Uvula midline. No oropharyngeal exudate or posterior oropharyngeal erythema.  Eyes:     Extraocular Movements: Extraocular movements intact.     Pupils: Pupils are equal, round, and reactive to light.  Cardiovascular:     Rate and Rhythm: Normal rate and regular rhythm.     Heart sounds: Normal heart sounds.  Pulmonary:     Effort: Pulmonary effort is normal.     Breath sounds: Normal breath sounds. No decreased breath sounds, wheezing, rhonchi or rales.  Abdominal:     Palpations: Abdomen is soft.     Tenderness: There is no abdominal tenderness. There is no guarding or rebound.  Lymphadenopathy:     Cervical: No cervical adenopathy.     Right cervical: No superficial cervical adenopathy.    Left cervical: No superficial cervical adenopathy.  Neurological:     General: No focal deficit present.     Mental Status: He is alert and oriented to person, place, and time.  Psychiatric:        Mood and Affect: Mood normal.        Behavior: Behavior normal.        Thought Content: Thought content normal.        Judgment: Judgment normal.     UC Treatments / Results  Labs (all labs ordered are listed, but only abnormal results are displayed) Labs Reviewed  POC INFLUENZA A AND B ANTIGEN (URGENT CARE ONLY) - Abnormal; Notable for the following components:      Result Value   INFLUENZA A ANTIGEN, POC POSITIVE (*)    All other components within normal limits    EKG   Radiology No results found.  Procedures Procedures (including critical care time)  Medications Ordered in UC  Medications - No data to display  Initial Impression / Assessment and Plan / UC Course  I have reviewed the triage vital signs and the nursing  notes.  Pertinent labs & imaging results that were available during my care of the patient were reviewed by me and considered in my medical decision making (see chart for details).     This patient is a very pleasant 65 y.o. year old male presenting with influenza A x5 days. Today this pt is afebrile nontachycardic nontachypneic, oxygenating well on room air, no wheezes rhonchi or rales. HIV- compliant with medication regimen, nondetectable per pt. I do not have these records.   Rapid influenza A positive. Given duration of symptoms (5 days) he is not a candidate for tamiflu COPD- Declines refills inhalers. Continue these. Also sent prednisone taper.   ED return precautions discussed. Patient verbalizes understanding and agreement.   Level 4 for acute exacerbation of chronic condition and prescription drug management.  Final Clinical Impressions(s) / UC Diagnoses   Final diagnoses:  Influenza due to identified novel influenza A virus with other respiratory manifestations  COPD exacerbation ([place])  HIV disease ([place])     Discharge Instructions      -You have the flu -Prednisone taper for cough/bronchitis. I recommend taking this in the morning as it could give you energy.  Avoid NSAIDs like ibuprofen and alleve while taking this medication as they can increase your risk of stomach upset and even GI bleeding when in combination with a steroid. You can continue tylenol (acetaminophen) up to 1000mg  3x daily. -Tylenol for fevers/chills, bodyaches -Continue inhalers  -With a virus, you're typically contagious for 5-7 days, or as long as you're having fevers.  -Follow-up if symptoms worsen like shortness of breath, chest pain, dizziness, weaknes      ED Prescriptions     Medication Sig Dispense Auth. Provider   predniSONE (STERAPRED UNI-PAK 21 TAB) 10 MG (21) TBPK tablet Take by mouth daily. Take 6 tabs by mouth daily  for 2 days, then 5 tabs for 2 days, then 4 tabs for 2 days, then  3 tabs for 2 days, 2 tabs for 2 days, then 1 tab by mouth daily for 2 days 42 tablet Hazel Sams, PA-C      PDMP not reviewed this encounter.   Hazel Sams, PA-C 02/08/21 2007

## 2021-02-08 NOTE — ED Triage Notes (Signed)
Pt presents with fever, cold chills, cough and sore throat x 5 days.   States his stomach is sore.   Pt has taken medicine at home and has not felt better.

## 2021-02-09 LAB — SARS CORONAVIRUS 2 (TAT 6-24 HRS): SARS Coronavirus 2: NEGATIVE

## 2021-02-10 ENCOUNTER — Ambulatory Visit: Payer: Medicaid Other | Admitting: Student

## 2021-02-21 ENCOUNTER — Ambulatory Visit (HOSPITAL_COMMUNITY): Admission: RE | Admit: 2021-02-21 | Payer: Medicaid Other | Source: Ambulatory Visit

## 2021-02-23 ENCOUNTER — Ambulatory Visit (HOSPITAL_COMMUNITY): Payer: Medicaid Other

## 2021-03-07 ENCOUNTER — Ambulatory Visit: Payer: Medicaid Other | Admitting: Emergency Medicine

## 2021-03-15 ENCOUNTER — Telehealth: Payer: Self-pay

## 2021-03-15 NOTE — Telephone Encounter (Signed)
Patient last seen 05/2019 - called to offer appointment. No answer and mailbox full.   Beryle Flock, RN

## 2021-03-30 ENCOUNTER — Ambulatory Visit: Payer: Medicaid Other | Admitting: Student

## 2021-03-30 NOTE — Progress Notes (Unsigned)
Synopsis: Referred for COPD by Gifford Shave, MD  Subjective:   PATIENT ID: Kevin [name] GENDER: male DOB: 09-09-1955, MRN: [id number]  No chief complaint on file.  45yM with HIV/AIDS followed by ID (acknowledges missing a few doses of biktarvy here and there), pAF, CHF, PAD, ~40 py smoking, CKD3 who is referred following ED visit for presumed AECOPD.  He says for about 2 weeks he had been waking up with trouble breathing, weakness, cough. Given doxy, prednisone and inhalers (dulera, albuterol). CXR there showed only hyperexpansion. Has had no fever but he has had weight loss since last winter (20 lb), no night sweats.   These are the first inhalers that he has tried. He rinses his mouth out after using dulera. Doing 2 puffs each morning or with activity. He is unsure whether or not prednisone was truly helpful.   Otherwise pertinent review of systems is negative.  No family history of lung disease although he says his sister is on oxygen.   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 Doesn't wear mask when cooking. Very rare MJ. No vaping. He has 4 dogs, rabbit. No pet birds or hot tubs. Never has lived outside of [place] for extended period.  Interval HPI: Last seen by me 11/02/20. Continued on dulera. Smoking cessation encouraged, CT Chest showed mild extent of emphysema and several subsolid/ggo nodules 4-24mm.  Has had 2 ED visits and 1 admission for AECOPD. In October it was in setting of hypertensive emergency and he was increased to triple therapy but discharged on dulera only.  Past Medical History:  Diagnosis Date   AIDS (acquired immune deficiency syndrome) ([place]) 08/17/2016   Chronic diastolic CHF (congestive heart failure), NYHA class 3 (HCC) 01/2016   Chronic lower back pain    CKD (chronic kidney disease), stage III (HCC)    COPD (chronic obstructive pulmonary disease) (HCC)    Gout    "forearms, hands, ankles, feet" (06/05/2016)   Headache    "weekly" (06/05/2016)    Heart murmur    Hypertension    Hypertensive crisis 08/15/2017   OSA on CPAP    PAD (peripheral artery disease) (HCC)    PAF (paroxysmal atrial fibrillation) ([place]) 01/2016     Family History  Problem Relation Age of Onset   High blood pressure Mother    Lupus Mother      Past Surgical History:  Procedure Laterality Date   LEFT HEART CATH AND CORONARY ANGIOGRAPHY N/A 10/23/2016   Procedure: LEFT HEART CATH AND CORONARY ANGIOGRAPHY;  Surgeon: Leonie Man, MD;  Location: [facility];  Service: Cardiovascular;  Laterality: N/A;   LOWER EXTREMITY ANGIOGRAPHY N/A 07/17/2016   Procedure: Lower Extremity Angiography;  Surgeon: Lorretta Harp, MD;  Location: [facility];  Service: Cardiovascular;  Laterality: N/A;   LOWER EXTREMITY INTERVENTION N/A 06/05/2016   Procedure: Lower Extremity Intervention;  Surgeon: Lorretta Harp, MD;  Location: [facility];  Service: Cardiovascular;  Laterality: N/A;   PERIPHERAL VASCULAR ATHERECTOMY Right 07/17/2016   Procedure: Peripheral Vascular Atherectomy;  Surgeon: Lorretta Harp, MD;  Location: [facility];  Service: Cardiovascular;  Laterality: Right;  SFA   PERIPHERAL VASCULAR INTERVENTION  06/05/2016   Procedure: Peripheral Vascular Intervention;  Surgeon: Lorretta Harp, MD;  Location: [facility];  Service: Cardiovascular;;  left SFA    Social History   Socioeconomic History   Marital status: Divorced    Spouse name:  Not on file   Number of children: Not on file   Years of education: Not on file   Highest education level: Not on file  Occupational History   Not on file  Tobacco Use   Smoking status: Every Day    Packs/day: 0.50    Years: 42.00    Pack years: 21.00    Types: Cigarettes   Smokeless tobacco: Never   Tobacco comments:    4 a day  Vaping Use   Vaping Use: Never used  Substance and Sexual Activity   Alcohol use: Yes    Alcohol/week: 2.0 standard drinks    Types: 2 Cans of beer  per week   Drug use: No   Sexual activity: Not Currently    Comment: offered condoms  Other Topics Concern   Not on file  Social History Narrative   Not on file   Social Determinants of Health   Financial Resource Strain: Not on file  Food Insecurity: Not on file  Transportation Needs: Not on file  Physical Activity: Not on file  Stress: Not on file  Social Connections: Not on file  Intimate Partner Violence: Not on file     No Known Allergies   Outpatient Medications Prior to Visit  Medication Sig Dispense Refill   albuterol (PROVENTIL) (2.5 MG/3ML) 0.083% nebulizer solution Take 3 mLs (2.5 mg total) by nebulization every 6 (six) hours as needed for wheezing or shortness of breath. 90 mL 0   albuterol (VENTOLIN HFA) 108 (90 Base) MCG/ACT inhaler Inhale 2 puffs into the lungs every 6 (six) hours as needed for wheezing or shortness of breath. 18 g 0   amLODipine (NORVASC) 5 MG tablet Take 5 mg by mouth daily. Take 1 tablet by mouth daily.     bictegravir-emtricitabine-tenofovir AF (BIKTARVY) 50-200-25 MG TABS tablet Take 1 tablet by mouth daily. 30 tablet 11   carvedilol (COREG) 6.25 MG tablet Take 1 tablet (6.25 mg total) by mouth 2 (two) times daily with a meal. (Patient taking differently: Take 6.25 mg by mouth daily.) 60 tablet 0   hydrALAZINE (APRESOLINE) 50 MG tablet Take 1 tablet (50 mg total) by mouth 3 (three) times daily. 90 tablet 0   mometasone-formoterol (DULERA) 100-5 MCG/ACT AERO Inhale 2 puffs into the lungs every morning. (Patient taking differently: Inhale 2 puffs into the lungs daily as needed for wheezing or shortness of breath.) 13 g 0   Multiple Vitamin (MULTIVITAMIN) tablet Take 1 tablet by mouth daily.     predniSONE (STERAPRED UNI-PAK 21 TAB) 10 MG (21) TBPK tablet Take by mouth daily. Take 6 tabs by mouth daily  for 2 days, then 5 tabs for 2 days, then 4 tabs for 2 days, then 3 tabs for 2 days, 2 tabs for 2 days, then 1 tab by mouth daily for 2 days 42 tablet  0   No facility-administered medications prior to visit.       Objective:   Physical Exam:  General appearance: 66 y.o., male, NAD, conversant, thin and chronically ill appearing Eyes: anicteric sclerae, moist conjunctivae; no lid-lag; PERRL, tracking appropriately HENT: NCAT; oropharynx, MMM, no mucosal ulcerations; normal hard and soft palate Neck: Trachea midline; no lymphadenopathy, no JVD Lungs: Diminished bilaterally, no crackles, no wheeze, with normal respiratory effort CV: bradycardic, RR, no MRGs  Abdomen: Soft, non-tender; non-distended, BS present  Extremities: trace edema, warm Skin: Normal temperature, turgor and texture; no rash Psych: Appropriate affect Neuro: Alert and oriented to person and place, no  focal deficit    There were no vitals filed for this visit.    on RA BMI Readings from Last 3 Encounters:  01/11/21 21.08 kg/m  12/03/20 19.72 kg/m  11/02/20 22.13 kg/m   Wt Readings from Last 3 Encounters:  01/11/21 155 lb 6.4 oz (70.5 kg)  12/03/20 145 lb 6.4 oz (66 kg)  11/02/20 154 lb 3.2 oz (69.9 kg)     CBC    Component Value Date/Time   WBC 9.9 01/01/2021 0804   RBC 4.03 (L) 01/01/2021 0804   HGB 12.4 (L) 01/01/2021 0804   HGB 12.6 (L) 07/10/2018 1103   HCT 37.7 (L) 01/01/2021 0804   HCT 38.1 07/10/2018 1103   PLT 327 01/01/2021 0804   PLT 276 07/10/2018 1103   MCV 93.5 01/01/2021 0804   MCV 95 07/10/2018 1103   MCH 30.8 01/01/2021 0804   MCHC 32.9 01/01/2021 0804   RDW 15.2 01/01/2021 0804   RDW 13.2 07/10/2018 1103   LYMPHSABS 2.5 11/30/2020 0208   LYMPHSABS 1.5 07/10/2018 1103   MONOABS 0.5 11/30/2020 0208   EOSABS 0.7 (H) 11/30/2020 0208   EOSABS 0.0 07/10/2018 1103   BASOSABS 0.0 11/30/2020 0208   BASOSABS 0.0 07/10/2018 1103   Eos 700 in ED  Recent CD4 counts over last 4 years all >700  Did have detectable HIV viral load 1 year ago  Chest Imaging:  CXR 10/25/20 reviewed by me and remarkable for hyperinflation  CT  Chest 11/15/20 reviewed  by me with mild extent of emphysema and several subsolid/ggo nodules 4-68mm  Pulmonary Functions Testing Results: No flowsheet data found.    Echocardiogram:   TTE 03/2019 with G1DD      Assessment & Plan:   # Dyspnea on exertion: # Weakness: At risk for COPD and at risk for rapid progression given underlying HIV. Did have Eosinophils of 700 at ED visit. Would also consider PJP, or other HIV associated ILD (although subacute pace would be odd). Otherwise consider diastolic dysfunction, uncontrolled HIV, deconditioning.  # Smoking  # Pulmonary nodules  Plan: - CT Chest - PFTs in a month - TSH, CK given concomitant weakness (had BMP, BNP last week already) - dulera 2 puffs BID for now - instructed to hold for 2 days before PFTs though - discuss lung cancer screening next visit - smoking cessation strongly encouraged     Maryjane Hurter, MD [facility] 03/30/2021 7:52 AM

## 2021-04-06 ENCOUNTER — Other Ambulatory Visit: Payer: Self-pay | Admitting: Gastroenterology

## 2021-04-06 DIAGNOSIS — R634 Abnormal weight loss: Secondary | ICD-10-CM

## 2021-04-06 DIAGNOSIS — R109 Unspecified abdominal pain: Secondary | ICD-10-CM

## 2021-04-06 DIAGNOSIS — K625 Hemorrhage of anus and rectum: Secondary | ICD-10-CM

## 2021-04-15 ENCOUNTER — Other Ambulatory Visit: Payer: Self-pay

## 2021-04-15 ENCOUNTER — Ambulatory Visit
Admission: RE | Admit: 2021-04-15 | Discharge: 2021-04-15 | Disposition: A | Discharge status: Home / Self Care | Payer: Medicaid Other | Source: Ambulatory Visit | Attending: Gastroenterology | Admitting: Gastroenterology

## 2021-04-15 DIAGNOSIS — K625 Hemorrhage of anus and rectum: Secondary | ICD-10-CM

## 2021-04-15 DIAGNOSIS — R634 Abnormal weight loss: Secondary | ICD-10-CM

## 2021-04-15 DIAGNOSIS — R109 Unspecified abdominal pain: Secondary | ICD-10-CM

## 2021-04-15 MED ORDER — IOPAMIDOL (ISOVUE-300) INJECTION 61%
100.0000 mL | Freq: Once | INTRAVENOUS | Status: AC | PRN
  Administered 2021-04-15: 100 mL via INTRAVENOUS

## 2021-04-19 ENCOUNTER — Ambulatory Visit (HOSPITAL_COMMUNITY): Payer: Medicaid Other

## 2021-04-19 ENCOUNTER — Encounter (HOSPITAL_COMMUNITY): Payer: Self-pay

## 2021-04-19 ENCOUNTER — Ambulatory Visit (INDEPENDENT_AMBULATORY_CARE_PROVIDER_SITE_OTHER): Payer: Medicaid Other

## 2021-04-19 ENCOUNTER — Other Ambulatory Visit: Payer: Self-pay

## 2021-04-19 ENCOUNTER — Ambulatory Visit (HOSPITAL_COMMUNITY)
Admission: EM | Admit: 2021-04-19 | Discharge: 2021-04-19 | Disposition: A | Discharge status: Home / Self Care | Payer: Medicaid Other | Attending: Physician Assistant | Admitting: Physician Assistant

## 2021-04-19 DIAGNOSIS — J441 Chronic obstructive pulmonary disease with (acute) exacerbation: Secondary | ICD-10-CM | POA: Diagnosis not present

## 2021-04-19 DIAGNOSIS — R0602 Shortness of breath: Secondary | ICD-10-CM | POA: Diagnosis not present

## 2021-04-19 DIAGNOSIS — I10 Essential (primary) hypertension: Secondary | ICD-10-CM | POA: Diagnosis not present

## 2021-04-19 MED ORDER — DOXYCYCLINE HYCLATE 100 MG PO CAPS
100.0000 mg | ORAL_CAPSULE | Freq: Two times a day (BID) | ORAL | 0 refills | Status: DC
Start: 2021-04-19 — End: 2021-05-13

## 2021-04-19 MED ORDER — METHYLPREDNISOLONE SODIUM SUCC 125 MG IJ SOLR
80.0000 mg | Freq: Once | INTRAMUSCULAR | Status: AC
  Administered 2021-04-19: 80 mg via INTRAMUSCULAR

## 2021-04-19 MED ORDER — ALBUTEROL SULFATE HFA 108 (90 BASE) MCG/ACT IN AERS: 2.0000 | INHALATION_SPRAY | Freq: Four times a day (QID) | RESPIRATORY_TRACT | 0 refills | Status: DC | PRN

## 2021-04-19 MED ORDER — PREDNISONE 10 MG (21) PO TBPK: ORAL_TABLET | Freq: Every day | ORAL | 0 refills | Status: DC

## 2021-04-19 MED ORDER — METHYLPREDNISOLONE SODIUM SUCC 125 MG IJ SOLR
INTRAMUSCULAR | Status: AC
  Filled 2021-04-19: qty 2

## 2021-04-19 MED ORDER — ALBUTEROL SULFATE (2.5 MG/3ML) 0.083% IN NEBU
INHALATION_SOLUTION | RESPIRATORY_TRACT | Status: AC
  Filled 2021-04-19: qty 3

## 2021-04-19 MED ORDER — ALBUTEROL SULFATE (2.5 MG/3ML) 0.083% IN NEBU
2.5000 mg | INHALATION_SOLUTION | Freq: Once | RESPIRATORY_TRACT | Status: AC
  Administered 2021-04-19: 2.5 mg via RESPIRATORY_TRACT

## 2021-04-19 MED ORDER — ALBUTEROL SULFATE (2.5 MG/3ML) 0.083% IN NEBU: 2.5000 mg | INHALATION_SOLUTION | Freq: Four times a day (QID) | RESPIRATORY_TRACT | 0 refills | Status: DC | PRN

## 2021-04-19 NOTE — ED Triage Notes (Signed)
Pt presents SOB with labored breathing on arrival with o2 sats 90%. States having a COPD exacerbation since yesterday. States walked here from across the street. States had a breathing tx this am. States hasn't had his b/p meds today. ?

## 2021-04-19 NOTE — Discharge Instructions (Signed)
As we discussed, I think the safest thing to do is to go to the emergency room.  Since you do not want to do that right now and you had improvement with your breathing treatment we are going to use albuterol nebulizer solution on a scheduled basis for the next several days every 4-6 hours.  Start prednisone taper and do not take NSAIDs including aspirin, ibuprofen/Advil, naproxen/Aleve with this medication.  Take doxycycline to cover for infection.  Use Mucinex and Tylenol for additional symptom relief.  Someone should evaluate you tomorrow as we discussed.  If you have any worsening symptoms overnight you must go to the emergency room. ?

## 2021-04-19 NOTE — ED Provider Notes (Signed)
[facility]    CSN: [id number] Arrival date & time: 04/19/21  1336      History   Chief Complaint Chief Complaint  Patient presents with   Shortness of Breath    HPI Kevin [name] is a 66 y.o. male.   Patient presents today with a 2-day history of worsening shortness of breath.  He has a history of COPD and reports current flareup began approximately 2 days ago and has been worsening since then.  He was walking near our building when he felt increasingly short of breath prompting evaluation.  He did take a breathing treatment earlier today.  He is not currently taking maintenance medication of Dulera on a regular basis.  He denies any known sick contacts.  Denies any recent illness including fever, congestion, nausea, vomiting.  He does report some cough.  He reports being hospitalized within the past several months but denies recent surgery, COVID-19 diagnosis, exogenous hormone use, recent travel.  He is a current everyday smoker smoking his typical amount.   Past Medical History:  Diagnosis Date   AIDS (acquired immune deficiency syndrome) ([place]) 08/17/2016   Chronic diastolic CHF (congestive heart failure), NYHA class 3 (HCC) 01/2016   Chronic lower back pain    CKD (chronic kidney disease), stage III (HCC)    COPD (chronic obstructive pulmonary disease) ([place])    Gout    "forearms, hands, ankles, feet" (06/05/2016)   Headache    "weekly" (06/05/2016)   Heart murmur    Hypertension    Hypertensive crisis 08/15/2017   OSA on CPAP    PAD (peripheral artery disease) (HCC)    PAF (paroxysmal atrial fibrillation) ([place]) 01/2016    Patient Active Problem List   Diagnosis Date Noted   Hypertensive crisis    Acute respiratory failure with hypoxia ([place])    Dyspnea 04/04/2019   Callus of foot 07/10/2018   Hypertensive urgency 08/15/2017   Coronary artery disease involving native coronary artery of native heart with unstable angina pectoris ([place]) 10/19/2016   Easy  bruising 08/11/2016   Claudication in peripheral vascular disease ([place]) 06/05/2016   Stage 3 chronic kidney disease ([place])    Peripheral arterial disease ([place]) 05/09/2016   Acute on chronic diastolic CHF (congestive heart failure), NYHA class 3 ([place]) 03/24/2016   OSA (obstructive sleep apnea) 03/24/2016   Essential hypertension 02/18/2016   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   Shortness of breath 02/07/2016   PAF (paroxysmal atrial fibrillation) ([place]) 02/07/2016   Tobacco abuse 02/07/2016   Acute on chronic renal insufficiency 02/07/2016   COPD exacerbation ([place]) 02/07/2016   Hypertensive emergency 02/07/2016   HIV disease ([place]) 08/27/2004    Past Surgical History:  Procedure Laterality Date   LEFT HEART CATH AND CORONARY ANGIOGRAPHY N/A 10/23/2016   Procedure: LEFT HEART CATH AND CORONARY ANGIOGRAPHY;  Surgeon: Leonie Man, MD;  Location: [facility];  Service: Cardiovascular;  Laterality: N/A;   LOWER EXTREMITY ANGIOGRAPHY N/A 07/17/2016   Procedure: Lower Extremity Angiography;  Surgeon: Lorretta Harp, MD;  Location: [facility];  Service: Cardiovascular;  Laterality: N/A;   LOWER EXTREMITY INTERVENTION N/A 06/05/2016   Procedure: Lower Extremity Intervention;  Surgeon: Lorretta Harp, MD;  Location: [facility];  Service: Cardiovascular;  Laterality: N/A;   PERIPHERAL VASCULAR ATHERECTOMY Right 07/17/2016   Procedure: Peripheral Vascular Atherectomy;  Surgeon: Lorretta Harp, MD;  Location: [facility];  Service: Cardiovascular;  Laterality: Right;  SFA  PERIPHERAL VASCULAR INTERVENTION  06/05/2016   Procedure: Peripheral Vascular Intervention;  Surgeon: Lorretta Harp, MD;  Location: [facility];  Service: Cardiovascular;;  left SFA       Home Medications    Prior to Admission medications   Medication Sig Start Date End Date Taking? Authorizing Provider  doxycycline (VIBRAMYCIN) 100 MG capsule Take 1 capsule (100 mg  total) by mouth 2 (two) times daily. 04/19/21  Yes Raniyah Curenton K, PA-C  predniSONE (STERAPRED UNI-PAK 21 TAB) 10 MG (21) TBPK tablet Take by mouth daily. Take 6 tabs by mouth daily  for 2 days, then 5 tabs for 2 days, then 4 tabs for 2 days, then 3 tabs for 2 days, 2 tabs for 2 days, then 1 tab by mouth daily for 2 days 04/19/21  Yes Naviah Belfield K, PA-C  albuterol (PROVENTIL) (2.5 MG/3ML) 0.083% nebulizer solution Take 3 mLs (2.5 mg total) by nebulization every 6 (six) hours as needed for wheezing or shortness of breath. 04/19/21   Chrisy Hillebrand, Derry Skill, PA-C  albuterol (VENTOLIN HFA) 108 (90 Base) MCG/ACT inhaler Inhale 2 puffs into the lungs every 6 (six) hours as needed for wheezing or shortness of breath. 04/19/21   Adeyemi Hamad K, PA-C  amLODipine (NORVASC) 5 MG tablet Take 5 mg by mouth daily. Take 1 tablet by mouth daily.    [provider]  bictegravir-emtricitabine-tenofovir AF (BIKTARVY) 50-200-25 MG TABS tablet Take 1 tablet by mouth daily. 10/25/20   Lamptey, Myrene Galas, MD  carvedilol (COREG) 6.25 MG tablet Take 1 tablet (6.25 mg total) by mouth 2 (two) times daily with a meal. Patient taking differently: Take 6.25 mg by mouth daily. 10/25/20   Lamptey, Myrene Galas, MD  hydrALAZINE (APRESOLINE) 50 MG tablet Take 1 tablet (50 mg total) by mouth 3 (three) times daily. 12/03/20   Alcus Dad, MD  mometasone-formoterol (DULERA) 100-5 MCG/ACT AERO Inhale 2 puffs into the lungs every morning. Patient taking differently: Inhale 2 puffs into the lungs daily as needed for wheezing or shortness of breath. 12/03/20   Alcus Dad, MD  Multiple Vitamin (MULTIVITAMIN) tablet Take 1 tablet by mouth daily.    [provider]    Family History Family History  Problem Relation Age of Onset   High blood pressure Mother    Lupus Mother     Social History Social History   Tobacco Use   Smoking status: Every Day    Packs/day: 0.50    Years: 42.00    Pack years: 21.00    Types: Cigarettes    Smokeless tobacco: Never   Tobacco comments:    4 a day  Vaping Use   Vaping Use: Never used  Substance Use Topics   Alcohol use: Yes    Alcohol/week: 2.0 standard drinks    Types: 2 Cans of beer per week   Drug use: No     Allergies   Patient has no known allergies.   Review of Systems Review of Systems  Constitutional:  Positive for activity change. Negative for appetite change, fatigue and fever.  HENT:  Negative for congestion, sinus pressure, sneezing and sore throat.   Respiratory:  Positive for cough, chest tightness and shortness of breath.   Cardiovascular:  Negative for chest pain.  Gastrointestinal:  Negative for abdominal pain, diarrhea, nausea and vomiting.    Physical Exam Triage Vital Signs ED Triage Vitals [04/19/21 1347]  Enc Vitals Group     BP (!) 180/111  Pulse Rate 68     Resp (!) 24     Temp 98.1 F (36.7 C)     Temp Source Oral     SpO2 94 %     Weight      Height      Head Circumference      Peak Flow      Pain Score 10     Pain Loc      Pain Edu?      Excl. in [place]?    No data found.  Updated Vital Signs BP (!) 183/95 (BP Location: Left Arm)    Pulse 75    Temp 98.1 F (36.7 C) (Oral)    Resp (!) 24    SpO2 94%   Visual Acuity Right Eye Distance:   Left Eye Distance:   Bilateral Distance:    Right Eye Near:   Left Eye Near:    Bilateral Near:     Physical Exam Vitals reviewed.  Constitutional:      General: He is awake.     Appearance: Normal appearance. He is well-developed. He is ill-appearing.     Comments: Very pleasant male appears stated age  HENT:     Head: Normocephalic and atraumatic.  Cardiovascular:     Rate and Rhythm: Normal rate and regular rhythm.     Heart sounds: Normal heart sounds, S1 normal and S2 normal. No murmur heard. Pulmonary:     Effort: Pulmonary effort is normal.     Breath sounds: No stridor. Wheezing and rhonchi present. No rales.  Neurological:     Mental Status: He is alert.   Psychiatric:        Behavior: Behavior is cooperative.     UC Treatments / Results  Labs (all labs ordered are listed, but only abnormal results are displayed) Labs Reviewed - No data to display  EKG   Radiology DG Chest 2 View  Result Date: 04/19/2021 CLINICAL DATA:  Shortness of breath EXAM: CHEST - 2 VIEW COMPARISON:  01/01/2021 FINDINGS: Cardiac size is within normal limits. Increase in AP diameter of chest and low position of diaphragms suggest possible COPD. There are no new infiltrates or signs of pulmonary edema. There is no effusion or pneumothorax. IMPRESSION: There are no new infiltrates or signs of pulmonary edema. Electronically Signed   By: Elmer Picker M.D.   On: 04/19/2021 14:36    Procedures Procedures (including critical care time)  Medications Ordered in UC Medications  methylPREDNISolone sodium succinate (SOLU-MEDROL) 125 mg/2 mL injection 80 mg (80 mg Intramuscular Given 04/19/21 1410)  albuterol (PROVENTIL) (2.5 MG/3ML) 0.083% nebulizer solution 2.5 mg (2.5 mg Nebulization Given 04/19/21 1411)    Initial Impression / Assessment and Plan / UC Course  I have reviewed the triage vital signs and the nursing notes.  Pertinent labs & imaging results that were available during my care of the patient were reviewed by me and considered in my medical decision making (see chart for details).     Patient was given albuterol treatment in clinic today with improvement of oxygen saturation up to 95% but continued shortness of breath with associated chest tightness.  Initially discussed potential utility of going to the emergency room for further evaluation and management given persistent symptoms but patient declined this due to concern for wait.  Patient was given Solu-Medrol in clinic which provided improvement of chest tightness symptoms but continued to have some shortness of breath.  EKG was obtained that showed normal sinus  rhythm with ventricular rate of 67 bpm  with ST-T wave abnormality in inferior leads that is similar to 01/11/2021 tracing with no acute ischemic changes.  Chest x-ray was obtained that showed no acute abnormalities.  Discussed that given his elevated blood pressure with shortness of breath I still recommend going to the emergency room but patient declined this again.  We will treat COPD exacerbation with prednisone taper and he was instructed not to take NSAIDs with this medication.  Refill of albuterol inhaler and nebulizer solution was sent to the pharmacy.  We will start doxycycline 100 mg twice daily for 10 days.  Discussed that he would need to be reevaluated tomorrow by PCP or our clinic.  If he has any worsening symptoms he needs to go directly to the emergency room to which he expressed understanding and reports he will call EMS if symptoms worsen.  Discussed alarm symptoms that warrant emergent evaluation including chest pain, shortness of breath, lightheadedness.  Discussed case and EKG with Dr. Mannie Stabile who agreed with treatment plan.  Final Clinical Impressions(s) / UC Diagnoses   Final diagnoses:  COPD exacerbation (HCC)  Shortness of breath  Elevated blood pressure reading with diagnosis of hypertension     Discharge Instructions      As we discussed, I think the safest thing to do is to go to the emergency room.  Since you do not want to do that right now and you had improvement with your breathing treatment we are going to use albuterol nebulizer solution on a scheduled basis for the next several days every 4-6 hours.  Start prednisone taper and do not take NSAIDs including aspirin, ibuprofen/Advil, naproxen/Aleve with this medication.  Take doxycycline to cover for infection.  Use Mucinex and Tylenol for additional symptom relief.  Someone should evaluate you tomorrow as we discussed.  If you have any worsening symptoms overnight you must go to the emergency room.     ED Prescriptions     Medication Sig Dispense Auth.  Provider   albuterol (PROVENTIL) (2.5 MG/3ML) 0.083% nebulizer solution Take 3 mLs (2.5 mg total) by nebulization every 6 (six) hours as needed for wheezing or shortness of breath. 90 mL Ayan Heffington K, PA-C   albuterol (VENTOLIN HFA) 108 (90 Base) MCG/ACT inhaler Inhale 2 puffs into the lungs every 6 (six) hours as needed for wheezing or shortness of breath. 18 g Dezirea Mccollister K, PA-C   doxycycline (VIBRAMYCIN) 100 MG capsule Take 1 capsule (100 mg total) by mouth 2 (two) times daily. 20 capsule Maude Gloor K, PA-C   predniSONE (STERAPRED UNI-PAK 21 TAB) 10 MG (21) TBPK tablet Take by mouth daily. Take 6 tabs by mouth daily  for 2 days, then 5 tabs for 2 days, then 4 tabs for 2 days, then 3 tabs for 2 days, 2 tabs for 2 days, then 1 tab by mouth daily for 2 days 42 tablet Ahmoni Edge K, PA-C      PDMP not reviewed this encounter.   Terrilee Croak, PA-C 04/19/21 [id number]

## 2021-04-21 ENCOUNTER — Other Ambulatory Visit: Payer: Self-pay | Admitting: Gastroenterology

## 2021-04-21 DIAGNOSIS — R9389 Abnormal findings on diagnostic imaging of other specified body structures: Secondary | ICD-10-CM

## 2021-04-29 ENCOUNTER — Ambulatory Visit: Payer: Medicaid Other | Admitting: Internal Medicine

## 2021-04-29 ENCOUNTER — Encounter: Payer: Self-pay | Admitting: Internal Medicine

## 2021-04-29 ENCOUNTER — Other Ambulatory Visit: Payer: Self-pay

## 2021-04-29 VITALS — BP 115/77 | HR 85 | Ht 72.0 in | Wt 162.2 lb

## 2021-04-29 DIAGNOSIS — I739 Peripheral vascular disease, unspecified: Secondary | ICD-10-CM

## 2021-04-29 DIAGNOSIS — I10 Essential (primary) hypertension: Secondary | ICD-10-CM | POA: Diagnosis not present

## 2021-04-29 DIAGNOSIS — N1832 Chronic kidney disease, stage 3b: Secondary | ICD-10-CM

## 2021-04-29 DIAGNOSIS — G4733 Obstructive sleep apnea (adult) (pediatric): Secondary | ICD-10-CM | POA: Diagnosis not present

## 2021-04-29 DIAGNOSIS — J438 Other emphysema: Secondary | ICD-10-CM

## 2021-04-29 NOTE — Patient Instructions (Signed)
Medication Instructions:  ?Your physician recommends that you continue on your current medications as directed. Please refer to the Current Medication list given to you today. ? ?*If you need a refill on your cardiac medications before your next appointment, please call your pharmacy* ? ?Follow-Up: ?At [facility], you and your health needs are our priority.  As part of our continuing mission to provide you with exceptional heart care, we have created designated Provider Care Teams.  These Care Teams include your primary Cardiologist (physician) and Advanced Practice Providers (APPs -  Physician Assistants and Nurse Practitioners) who all work together to provide you with the care you need, when you need it. ? ?We recommend signing up for the patient portal called "MyChart".  Sign up information is provided on this After Visit Summary.  MyChart is used to connect with patients for Virtual Visits (Telemedicine).  Patients are able to view lab/test results, encounter notes, upcoming appointments, etc.  Non-urgent messages can be sent to your provider as well.   ?To learn more about what you can do with MyChart, go to [email or web address].   ? ?Your next appointment:   ?12 months with Dr. Debara Pickett  ? ?

## 2021-04-29 NOTE — Progress Notes (Signed)
? ? ?OFFICE NOTE ? ?Chief Complaint:  ?Follow-up ? ?Primary Care Physician: ?Lucianne Lei, MD ? ?HPI:  ?Kevin [name] is a 65 y.o. male who I saw in the hospital over Christmas. He has history of hypertension and had not seen his primary care provider for some time because they've not been able to get an appointment. He reports 2 weeks of worsening dyspnea on exertion, headache and new visual changes. He was found to have hypertensive emergency with blood pressures of 101-751/025 systolic. He's also noted have an elevated creatinine. He denies any chest pain. On arrival is noted to be in A. fib with rate control. He has significant LVH on EKG as well as T-wave inversions in the inferior leads. Initial troponin was 0.02. Chest x-ray shows peribronchial thickening and hyperinflation concerning for possible COPD. BNP was mildly elevated at 208. He was given Lasix and has diuresed a considerable amount since he was in the emergency department.  ? ?03/24/2016 ? ?Since discharge he saw [name], PA-C in follow-up. He said a number of other medical issues including visual changes and headaches for which she underwent a temporal artery biopsy that was negative. He also is on a steroid taper and noted that when his steroids were tapered off he started having muscle pains and weakness. He went back on the steroids and his symptoms improved. Blood pressure was still elevated today 160/90. He reports shortness of breath which hasn't improved despite using inhalers. He does have some COPD but is on [place]. Echo showed EF of 65-70%. He did have paroxysmal atrial fibrillation with a CHADSVASC score of 1. EKG today shows sinus rhythm with minimal voltage criteria for LVH. He is on aspirin for anticoagulation. He has not had an ischemia evaluation. ? ?05/01/2016 ? ?Kevin [name] returns today for follow-up. He reports that he has been a little more short of breath recently. He is on a number of inhalers and last night  was more congested. His blood pressure looks better today and a recheck was 130/94. He denies any recurrent atrial fibrillation. I received lab work from his nephrologist [name], which showed creatinine of 1.41. He is not on enalapril due to possible renal artery stenosis. He does have renal Dopplers which are pending. In addition he's had some leg weakness which was both at rest and with exertion. He recently saw a podiatrist who diagnosed him with plantar fasciitis, but did order peripheral artery Dopplers which will be performed today in the office. Given his long-standing smoking history, he could have either PAD or renal artery stenosis for sure. I also had ordered a stress test on him. This demonstrated a fixed inferior defect which was suggestive of possible scar with mildly decreased LVEF of 47%. He is not having any active chest pain. This could represent a prior infarct, but he does not have any recollection of that. It suggests he does have significant underlying coronary disease. At this point I would recommend medical therapy given his chronic kidney disease as he would be at increased risk for contrast nephropathy with heart catheterization. Finally, he did recently have a sleep study which was abnormal and it was suggested that he be fitted with BiPAP therapy. That was about 3 weeks ago and he is not yet received equipment. I spoke with [name] nurse [name] she was involved in contacting the patient to arrange for his equipment. ? ?10/19/2016 ? ?Kevin [name] was seen today in follow-up. He reports persistently worsening shortness  of breath. He says he can no longer do normal activities. He used to works fairly regularly however wears upper quickly. He says he now takes in more than 6 hours to mow his lawn. Recently he underwent peripheral angiography by Dr. [name] and was found to have an SFA occlusion which was stented. He says he's had improvement in his leg pain. He required preadmission and  hydration due to chronic kidney disease. He also has a history of HIV disease and was recently reestablished with the infectious disease clinic. Recently his viral load is undetectable. Stress testing in February 2018 showed an LVEF of 47% with a medium-size moderate severity inferior defect suggestive of inferior MI. He is not aware of prior heart attack. He also has some chronic lung disease likely COPD but is on a number of inhalers and reports this is not helping his shortness of breath. He also says his fatigue. He describes the chest pain as well. Despite the findings on the stress test, suspect this is representing progressive coronary artery disease. ? ?04/29/2021 ? ?Kevin [name] returns today for follow-up.  He is accompanied by his daughter.  He recently was seen by Dr. Claiborne Billings for ongoing management of his sleep apnea on BiPAP.  He seems to be doing well on this treatment.  He denies any worsening shortness of breath or chest pain.  He has not had any recurrent A-fib which was remote and he is not anticoagulated for this.  He has been having some stomach issues and recently diagnosed with an intestinal infection.  He denies any claudication.  Lipid testing in December 2022 showed total cholesterol 196 and LDL 118. ? ?PMHx:  ?Past Medical History:  ?Diagnosis Date  ? AIDS (acquired immune deficiency syndrome) ([place]) 08/17/2016  ? Chronic diastolic CHF (congestive heart failure), NYHA class 3 ([place]) 01/2016  ? Chronic lower back pain   ? CKD (chronic kidney disease), stage III ([place])   ? COPD (chronic obstructive pulmonary disease) ([place])   ? Gout   ? "forearms, hands, ankles, feet" (06/05/2016)  ? Headache   ? "weekly" (06/05/2016)  ? Heart murmur   ? Hypertension   ? Hypertensive crisis 08/15/2017  ? OSA on CPAP   ? PAD (peripheral artery disease) ([place])   ? PAF (paroxysmal atrial fibrillation) ([place]) 01/2016  ? ? ?Past Surgical History:  ?Procedure Laterality Date  ? LEFT HEART CATH AND CORONARY ANGIOGRAPHY N/A 10/23/2016   ? Procedure: LEFT HEART CATH AND CORONARY ANGIOGRAPHY;  Surgeon: Leonie Man, MD;  Location: [facility];  Service: Cardiovascular;  Laterality: N/A;  ? LOWER EXTREMITY ANGIOGRAPHY N/A 07/17/2016  ? Procedure: Lower Extremity Angiography;  Surgeon: Lorretta Harp, MD;  Location: [facility];  Service: Cardiovascular;  Laterality: N/A;  ? LOWER EXTREMITY INTERVENTION N/A 06/05/2016  ? Procedure: Lower Extremity Intervention;  Surgeon: Lorretta Harp, MD;  Location: [facility];  Service: Cardiovascular;  Laterality: N/A;  ? PERIPHERAL VASCULAR ATHERECTOMY Right 07/17/2016  ? Procedure: Peripheral Vascular Atherectomy;  Surgeon: Lorretta Harp, MD;  Location: [facility];  Service: Cardiovascular;  Laterality: Right;  SFA  ? PERIPHERAL VASCULAR INTERVENTION  06/05/2016  ? Procedure: Peripheral Vascular Intervention;  Surgeon: Lorretta Harp, MD;  Location: [facility];  Service: Cardiovascular;;  left SFA  ? ? ?FAMHx:  ?Family History  ?Problem Relation Age of Onset  ? High blood pressure Mother   ? Lupus Mother   ? ? ?SOCHx:  ?  reports that he has been smoking cigarettes. He has a 21.00 pack-year smoking history. He has never used smokeless tobacco. He reports [name] alcohol use of about 2.0 standard drinks per week. He reports that he does not use drugs. ? ?ALLERGIES:  ?No Known Allergies ? ?ROS: ?Pertinent items noted in HPI and remainder of comprehensive ROS otherwise negative. ? ?HOME MEDS: ?[name] Outpatient Medications on File Prior to Visit  ?Medication Sig Dispense Refill  ? albuterol (PROVENTIL) (2.5 MG/3ML) 0.083% nebulizer solution Take 3 mLs (2.5 mg total) by nebulization every 6 (six) hours as needed for wheezing or shortness of breath. 90 mL 0  ? albuterol (VENTOLIN HFA) 108 (90 Base) MCG/ACT inhaler Inhale 2 puffs into the lungs every 6 (six) hours as needed for wheezing or shortness of breath. 18 g 0  ? amLODipine (NORVASC) 10 MG tablet 1 tablet    ?  bictegravir-emtricitabine-tenofovir AF (BIKTARVY) 50-200-25 MG TABS tablet Take 1 tablet by mouth daily. 30 tablet 11  ? carvedilol (COREG) 6.25 MG tablet Take 1 tablet (6.25 mg total) by mouth 2 (two) tim

## 2021-05-13 ENCOUNTER — Ambulatory Visit (HOSPITAL_COMMUNITY)
Admission: EM | Admit: 2021-05-13 | Discharge: 2021-05-13 | Disposition: A | Discharge status: Home / Self Care | Payer: Medicaid Other | Attending: Family Medicine | Admitting: Family Medicine

## 2021-05-13 ENCOUNTER — Other Ambulatory Visit: Payer: Self-pay

## 2021-05-13 DIAGNOSIS — J441 Chronic obstructive pulmonary disease with (acute) exacerbation: Secondary | ICD-10-CM | POA: Diagnosis not present

## 2021-05-13 MED ORDER — IPRATROPIUM-ALBUTEROL 0.5-2.5 (3) MG/3ML IN SOLN
RESPIRATORY_TRACT | Status: AC
  Filled 2021-05-13: qty 3

## 2021-05-13 MED ORDER — TRIAMCINOLONE ACETONIDE 40 MG/ML IJ SUSP
40.0000 mg | Freq: Once | INTRAMUSCULAR | Status: AC
  Administered 2021-05-13: 40 mg via INTRAMUSCULAR

## 2021-05-13 MED ORDER — PREDNISONE 20 MG PO TABS: ORAL_TABLET | ORAL | 0 refills | Status: DC

## 2021-05-13 MED ORDER — IPRATROPIUM-ALBUTEROL 0.5-2.5 (3) MG/3ML IN SOLN
3.0000 mL | Freq: Once | RESPIRATORY_TRACT | Status: AC
  Administered 2021-05-13: 3 mL via RESPIRATORY_TRACT

## 2021-05-13 MED ORDER — TRIAMCINOLONE ACETONIDE 40 MG/ML IJ SUSP
INTRAMUSCULAR | Status: AC
  Filled 2021-05-13: qty 1

## 2021-05-13 MED ORDER — ALBUTEROL SULFATE (2.5 MG/3ML) 0.083% IN NEBU: 2.5000 mg | INHALATION_SOLUTION | RESPIRATORY_TRACT | 2 refills | Status: DC | PRN

## 2021-05-13 MED ORDER — DULERA 100-5 MCG/ACT IN AERO: 2.0000 | INHALATION_SPRAY | Freq: Two times a day (BID) | RESPIRATORY_TRACT | 0 refills | Status: DC

## 2021-05-13 NOTE — ED Provider Notes (Signed)
?[facility] ? ? ? ?CSN: [id number] ?Arrival date & time: 05/13/21  1744 ? ? ?  ? ?History   ?Chief Complaint ?Chief Complaint  ?Patient presents with  ? Shortness of Breath  ? ? ?HPI ?Kevin [name] is a 66 y.o. male.  ? ? ?Shortness of Breath ?Here for 3-day history of increasing shortness of breath.  He states that he began having trouble 2 to 3 days after he finished his prednisone taper.  He was seen here March 7 with similar symptoms ? ?No fever or chills ? ?He states that he has had some left ear pain ? ?He is unclear when I ask him if he is on a controller medicine.  It looks like he supposed to be on Dulera twice daily.  It is unclear if he is taking that 2 puffs twice daily ? ? ? ?Past Medical History:  ?Diagnosis Date  ? AIDS (acquired immune deficiency syndrome) ([place]) 08/17/2016  ? Chronic diastolic CHF (congestive heart failure), NYHA class 3 ([place]) 01/2016  ? Chronic lower back pain   ? CKD (chronic kidney disease), stage III ([place])   ? COPD (chronic obstructive pulmonary disease) ([place])   ? Gout   ? "forearms, hands, ankles, feet" (06/05/2016)  ? Headache   ? "weekly" (06/05/2016)  ? Heart murmur   ? Hypertension   ? Hypertensive crisis 08/15/2017  ? OSA on CPAP   ? PAD (peripheral artery disease) ([place])   ? PAF (paroxysmal atrial fibrillation) ([place]) 01/2016  ? ? ?Patient Active Problem List  ? Diagnosis Date Noted  ? Hypertensive crisis   ? Acute respiratory failure with hypoxia ([place])   ? Dyspnea 04/04/2019  ? Callus of foot 07/10/2018  ? Hypertensive urgency 08/15/2017  ? Coronary artery disease involving native coronary artery of native heart with unstable angina pectoris ([place]) 10/19/2016  ? Easy bruising 08/11/2016  ? Claudication in peripheral vascular disease ([place]) 06/05/2016  ? Stage 3 chronic kidney disease ([place])   ? Peripheral arterial disease ([place]) 05/09/2016  ? Acute on chronic diastolic CHF (congestive heart failure), NYHA class 3 ([place]) 03/24/2016  ? OSA (obstructive sleep apnea)  03/24/2016  ? Essential hypertension 02/18/2016  ? 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 Shortness of breath 02/07/2016  ? PAF (paroxysmal atrial fibrillation) ([place]) 02/07/2016  ? Tobacco abuse 02/07/2016  ? Acute on chronic renal insufficiency 02/07/2016  ? COPD exacerbation ([place]) 02/07/2016  ? Hypertensive emergency 02/07/2016  ? HIV disease ([place]) 08/27/2004  ? ? ?Past Surgical History:  ?Procedure Laterality Date  ? LEFT HEART CATH AND CORONARY ANGIOGRAPHY N/A 10/23/2016  ? Procedure: LEFT HEART CATH AND CORONARY ANGIOGRAPHY;  Surgeon: Leonie Man, MD;  Location: [facility];  Service: Cardiovascular;  Laterality: N/A;  ? LOWER EXTREMITY ANGIOGRAPHY N/A 07/17/2016  ? Procedure: Lower Extremity Angiography;  Surgeon: Lorretta Harp, MD;  Location: [facility];  Service: Cardiovascular;  Laterality: N/A;  ? LOWER EXTREMITY INTERVENTION N/A 06/05/2016  ? Procedure: Lower Extremity Intervention;  Surgeon: Lorretta Harp, MD;  Location: [facility];  Service: Cardiovascular;  Laterality: N/A;  ? PERIPHERAL VASCULAR ATHERECTOMY Right 07/17/2016  ? Procedure: Peripheral Vascular Atherectomy;  Surgeon: Lorretta Harp, MD;  Location: [facility];  Service: Cardiovascular;  Laterality: Right;  SFA  ? PERIPHERAL VASCULAR INTERVENTION  06/05/2016  ? Procedure: Peripheral Vascular Intervention;  Surgeon: Lorretta Harp, MD;  Location: [facility];  Service: Cardiovascular;;  left SFA  ? ? ? ? ? ?  Home Medications   ? ?Prior to Admission medications   ?Medication Sig Start Date End Date Taking? Authorizing Provider  ?predniSONE (DELTASONE) 20 MG tablet 3 tabs daily x3 days, then 2 tabs daily x3 days, then 1 tab daily x3 days, then one half tab daily x3 days, then stop 05/13/21  Yes Duong Haydel, Gwenlyn Perking, MD  ?albuterol (PROVENTIL) (2.5 MG/3ML) 0.083% nebulizer solution Take 3 mLs (2.5 mg total) by nebulization every 4 (four) hours as needed for wheezing or shortness of breath.  05/13/21   [name] Henle, MD  ?albuterol (VENTOLIN HFA) 108 (90 Base) MCG/ACT inhaler Inhale 2 puffs into the lungs every 6 (six) hours as needed for wheezing or shortness of breath. 04/19/21   Raspet, Derry Skill, PA-C  ?amLODipine (NORVASC) 10 MG tablet 1 tablet    [provider]  ?amLODipine (NORVASC) 5 MG tablet Take 5 mg by mouth daily. Take 1 tablet by mouth daily. ?Patient not taking: Reported on 04/29/2021    [provider]  ?bictegravir-emtricitabine-tenofovir AF (BIKTARVY) 50-200-25 MG TABS tablet Take 1 tablet by mouth daily. 10/25/20   Lamptey, Myrene Galas, MD  ?carvedilol (COREG) 6.25 MG tablet Take 1 tablet (6.25 mg total) by mouth 2 (two) times daily with a meal. ?Patient taking differently: Take 6.25 mg by mouth daily. 10/25/20   LampteyMyrene Galas, MD  ?hydrALAZINE (APRESOLINE) 50 MG tablet Take 1 tablet (50 mg total) by mouth 3 (three) times daily. 12/03/20   Alcus Dad, MD  ?mometasone-formoterol [facility]) 100-5 MCG/ACT AERO Inhale 2 puffs into the lungs in the morning and at bedtime. 05/13/21   [name] Henle, MD  ?Multiple Vitamin (MULTIVITAMIN) tablet Take 1 tablet by mouth daily.    [provider]  ? ? ?Family History ?Family History  ?Problem Relation Age of Onset  ? High blood pressure Mother   ? Lupus Mother   ? ? ?Social History ?Social History  ? ?Tobacco Use  ? Smoking status: Every Day  ?  Packs/day: 0.50  ?  Years: 42.00  ?  Pack years: 21.00  ?  Types: Cigarettes  ? Smokeless tobacco: Never  ? Tobacco comments:  ?  4 a day  ?Vaping Use  ? Vaping Use: Never used  ?Substance Use Topics  ? Alcohol use: Yes  ?  Alcohol/week: 2.0 standard drinks  ?  Types: 2 Cans of beer per week  ? Drug use: No  ? ? ? ?Allergies   ?Patient has no known allergies. ? ? ?Review of Systems ?Review of Systems  ?Respiratory:  Positive for shortness of breath.   ? ? ?Physical Exam ?Triage Vital Signs ?ED Triage Vitals [05/13/21 1748]  ?Enc Vitals Group  ?   BP (!) 159/97  ?   Pulse  Rate 77  ?   Resp 18  ?   Temp 98.1 ?F (36.7 ?C)  ?   Temp Source Oral  ?   SpO2 99 %  ?   Weight   ?   Height   ?   Head Circumference   ?   Peak Flow   ?   Pain Score   ?   Pain Loc   ?   Pain Edu?   ?   Excl. in [place]?   ? ?No data found. ? ?Updated Vital Signs ?BP (!) 159/97 (BP Location: Left Arm)   Pulse 77   Temp 98.1 ?F (36.7 ?C) (Oral)   Resp 18   SpO2 99%  ? ?Visual Acuity ?  Right Eye Distance:   ?Left Eye Distance:   ?Bilateral Distance:   ? ?Right Eye Near:   ?Left Eye Near:    ?Bilateral Near:    ? ?Physical Exam ?Vitals reviewed.  ?Constitutional:   ?   General: He is not in acute distress. ?   Appearance: He is not toxic-appearing.  ?HENT:  ?   Right Ear: Tympanic membrane and ear canal normal.  ?   Left Ear: Tympanic membrane and ear canal normal.  ?   Ears:  ?   Comments: Is no swelling around either ear. ?   Nose: Nose normal.  ?   Mouth/Throat:  ?   Mouth: Mucous membranes are moist.  ?   Pharynx: No oropharyngeal exudate or posterior oropharyngeal erythema.  ?Eyes:  ?   Extraocular Movements: Extraocular movements intact.  ?   Conjunctiva/sclera: Conjunctivae normal.  ?   Pupils: Pupils are equal, round, and reactive to light.  ?Cardiovascular:  ?   Rate and Rhythm: Normal rate and regular rhythm.  ?   Heart sounds: No murmur heard. ?Pulmonary:  ?   Breath sounds: No stridor. No rhonchi.  ?   Comments: Respiratory rate is 28 by me.  There are scant expiratory wheezes with fair air movement.  He looks air hungry from across the room ?Musculoskeletal:  ?   Cervical back: Neck supple.  ?Lymphadenopathy:  ?   Cervical: No cervical adenopathy.  ?Skin: ?   Capillary Refill: Capillary refill takes less than 2 seconds.  ?   Coloration: Skin is not jaundiced or pale.  ?Neurological:  ?   General: No focal deficit present.  ?   Mental Status: He is alert and oriented to person, place, and time.  ?Psychiatric:     ?   Behavior: Behavior normal.  ? ? ? ?UC Treatments / Results  ?Labs ?(all labs ordered are  listed, but only abnormal results are displayed) ?Labs Reviewed - No data to display ? ?EKG ? ? ?Radiology ?No results found. ? ?Procedures ?Procedures (including critical care time) ? ?Medications Ordered in UC ?Medi

## 2021-05-13 NOTE — ED Triage Notes (Signed)
Pt presents SOB that started 3 days ago. Pt with a h/o COPD. Last inhaler was 30 minute ago. ?

## 2021-05-13 NOTE — Discharge Instructions (Addendum)
You have been given 1 treatment of DuoNeb which is a combination of albuterol and ipratropium ? ?You are given a shot of triamcinolone 40 mg ? ?Do your Dulera--2 puffs twice daily routinely, and not as needed ? ?Use your albuterol in the nebulizer every 4 hours as needed for shortness of breath or wheezing ? ?Prednisone 20 mg--take 3 tablets daily x3 days, then 2 tablets daily x3 days, then 1 tablet daily x3 days, then 1/2 tablet daily x3 days, then stop ?

## 2021-05-14 ENCOUNTER — Ambulatory Visit
Admission: RE | Admit: 2021-05-14 | Discharge: 2021-05-14 | Disposition: A | Discharge status: Home / Self Care | Payer: Medicaid Other | Source: Ambulatory Visit | Attending: Gastroenterology | Admitting: Gastroenterology

## 2021-05-14 DIAGNOSIS — R9389 Abnormal findings on diagnostic imaging of other specified body structures: Secondary | ICD-10-CM

## 2021-05-14 MED ORDER — GADOBENATE DIMEGLUMINE 529 MG/ML IV SOLN
15.0000 mL | Freq: Once | INTRAVENOUS | Status: AC | PRN
  Administered 2021-05-14: 15 mL via INTRAVENOUS

## 2021-05-27 ENCOUNTER — Other Ambulatory Visit: Payer: Self-pay | Admitting: Urology

## 2021-05-27 DIAGNOSIS — N2889 Other specified disorders of kidney and ureter: Secondary | ICD-10-CM

## 2021-05-31 ENCOUNTER — Ambulatory Visit
Admission: RE | Admit: 2021-05-31 | Discharge: 2021-05-31 | Disposition: A | Discharge status: Home / Self Care | Payer: Medicaid Other | Source: Ambulatory Visit | Attending: Urology | Admitting: Urology

## 2021-05-31 ENCOUNTER — Encounter: Payer: Self-pay | Admitting: *Deleted

## 2021-05-31 DIAGNOSIS — N2889 Other specified disorders of kidney and ureter: Secondary | ICD-10-CM

## 2021-05-31 HISTORY — PX: IR RADIOLOGIST EVAL & MGMT: IMG5224

## 2021-05-31 NOTE — Consult Note (Signed)
? ? ?Chief Complaint: ?Patient was seen in consultation today for a suspicious right renal lesion ? at the request of Winter,Christopher Marjory Lies ? ?Referring Physician(s): ?Winter,Christopher Marjory Lies ? ?History of Present Illness: ?Kevin [name] is a 66 y.o. male with multiple medical problems including HIV, chronic diastolic CHF, poorly controlled hypertension, chronic kidney disease stage III, COPD, atrial fibrillation, obstructive sleep apnea and peripheral arterial disease.  Patient reports 20 to 30 pound weight loss over the past year with decreased energy level.  Patient had a CT of the abdomen and pelvis on 04/15/2021 to evaluate for weight loss, rectal bleeding and abdominal pain.  CT demonstrated an indeterminate lesion in the posterior right kidney.  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  Patient was seen by Dr. Gilford Rile in urology and they discussed treatment options versus observation.  Patient is not good candidate for surgery based on his comorbidities and chronic kidney disease.  Patient is still smoking.  He continues to have respiratory issues and shortness of breath.  He had atypical right-sided chest pain yesterday that was self-limiting.  He denies hematuria or dysuria.  No bowel issues.  Patient has been taking his HIV medication but has not followed up with infectious disease for a while.  He says that he is scheduled to follow-up with infectious disease in the upcoming weeks.  Although his energy level has decreased over the past year he is still able to do daily activities and said that he recently mowed his lawn. ? ? ?Past Medical History:  ?Diagnosis Date  ? AIDS (acquired immune deficiency syndrome) ([place]) 08/17/2016  ? Chronic diastolic CHF (congestive heart failure), NYHA class 3 ([place]) 01/2016  ? Chronic lower back pain   ? CKD (chronic kidney disease), stage III ([place])    ? COPD (chronic obstructive pulmonary disease) ([place])   ? Gout   ? "forearms, hands, ankles, feet" (06/05/2016)  ? Headache   ? "weekly" (06/05/2016)  ? Heart murmur   ? Hypertension   ? Hypertensive crisis 08/15/2017  ? OSA on CPAP   ? PAD (peripheral artery disease) ([place])   ? PAF (paroxysmal atrial fibrillation) ([place]) 01/2016  ? ? ?Past Surgical History:  ?Procedure Laterality Date  ? LEFT HEART CATH AND CORONARY ANGIOGRAPHY N/A 10/23/2016  ? Procedure: LEFT HEART CATH AND CORONARY ANGIOGRAPHY;  Surgeon: Leonie Man, MD;  Location: [facility];  Service: Cardiovascular;  Laterality: N/A;  ? LOWER EXTREMITY ANGIOGRAPHY N/A 07/17/2016  ? Procedure: Lower Extremity Angiography;  Surgeon: Lorretta Harp, MD;  Location: [facility];  Service: Cardiovascular;  Laterality: N/A;  ? LOWER EXTREMITY INTERVENTION N/A 06/05/2016  ? Procedure: Lower Extremity Intervention;  Surgeon: Lorretta Harp, MD;  Location: [facility];  Service: Cardiovascular;  Laterality: N/A;  ? PERIPHERAL VASCULAR ATHERECTOMY Right 07/17/2016  ? Procedure: Peripheral Vascular Atherectomy;  Surgeon: Lorretta Harp, MD;  Location: [facility];  Service: Cardiovascular;  Laterality: Right;  SFA  ? PERIPHERAL VASCULAR INTERVENTION  06/05/2016  ? Procedure: Peripheral Vascular Intervention;  Surgeon: Lorretta Harp, MD;  Location: [facility];  Service: Cardiovascular;;  left SFA  ? ? ?Allergies: ?Patient has no known allergies. ? ?Medications: ?Prior to Admission medications   ?Medication Sig Start Date End Date Taking? Authorizing Provider  ?albuterol (PROVENTIL) (2.5 MG/3ML) 0.083% nebulizer solution Take 3 mLs (2.5 mg  total) by nebulization every 4 (four) hours as needed for wheezing or shortness of breath. 05/13/21   [name] Henle, MD  ?albuterol (VENTOLIN HFA) 108 (90 Base) MCG/ACT inhaler Inhale 2 puffs into the lungs every 6 (six) hours as needed for wheezing or shortness of breath. 04/19/21   Raspet, Derry Skill, PA-C  ?amLODipine (NORVASC) 10 MG tablet 1 tablet    [provider]  ?amLODipine (NORVASC) 5 MG tablet Take 5 mg by mouth daily. Take 1 tablet by mouth daily. ?Patient not taking: Reported on 04/29/2021    [provider]  ?bictegravir-emtricitabine-tenofovir AF (BIKTARVY) 50-200-25 MG TABS tablet Take 1 tablet by mouth daily. 10/25/20   Lamptey, Myrene Galas, MD  ?carvedilol (COREG) 6.25 MG tablet Take 1 tablet (6.25 mg total) by mouth 2 (two) times daily with a meal. ?Patient taking differently: Take 6.25 mg by mouth daily. 10/25/20   LampteyMyrene Galas, MD  ?hydrALAZINE (APRESOLINE) 50 MG tablet Take 1 tablet (50 mg total) by mouth 3 (three) times daily. 12/03/20   Alcus Dad, MD  ?mometasone-formoterol [facility]) 100-5 MCG/ACT AERO Inhale 2 puffs into the lungs in the morning and at bedtime. 05/13/21   [name] Henle, MD  ?Multiple Vitamin (MULTIVITAMIN) tablet Take 1 tablet by mouth daily.    [provider]  ?predniSONE (DELTASONE) 20 MG tablet 3 tabs daily x3 days, then 2 tabs daily x3 days, then 1 tab daily x3 days, then one half tab daily x3 days, then stop 05/13/21   [name] Henle, MD  ?  ? ?Family History  ?Problem Relation Age of Onset  ? High blood pressure Mother   ? Lupus Mother   ? ? ?Social History  ? ?Socioeconomic History  ? Marital status: Significant Other  ?  Spouse name: Not on file  ? Number of children: Not on file  ? Years of education: Not on file  ? Highest education level: Not on file  ?Occupational History  ? Not on file  ?Tobacco Use  ? Smoking status: Every Day  ?  Packs/day: 0.50  ?  Years: 42.00  ?  Pack years: 21.00  ?  Types: Cigarettes  ? Smokeless tobacco: Never  ? Tobacco comments:  ?  4 a day  ?Vaping Use  ? Vaping Use: Never used  ?Substance and Sexual Activity  ? Alcohol use: Yes  ?  Alcohol/week: 2.0 standard drinks  ?  Types: 2 Cans of beer per week  ? Drug use: No  ? Sexual activity: Not Currently  ?  Comment: offered condoms  ?Other  Topics Concern  ? Not on file  ?Social History Narrative  ? Not on file  ? ?Social Determinants of Health  ? ?Financial Resource Strain: Not on file  ?Food Insecurity: Not on file  ?Transportation Needs: Not on file  ?Physical Activity: Not on file  ?Stress: Not on file  ?Social Connections: Not on file  ? ? ?ECOG Status: ?1 - Symptomatic but completely ambulatory ? ? ?Review of Systems  ?Constitutional:  Positive for activity change and unexpected weight change.  ?Respiratory:  Positive for shortness of breath.   ?Cardiovascular:  Positive for chest pain.  ?Gastrointestinal: Negative.   ?Genitourinary: Negative.  Negative for dysuria.  ? ?Vital Signs: ?BP (!) 205/115 (BP Location: Right Arm)   Pulse 65   SpO2 98%  ? ?Physical Exam ?Constitutional:   ?   Appearance: Normal appearance. He is not ill-appearing.  ?Cardiovascular:  ?   Rate  and Rhythm: Normal rate and regular rhythm.  ?Pulmonary:  ?   Effort: Pulmonary effort is normal.  ?   Breath sounds: Normal breath sounds.  ?Abdominal:  ?   General: Abdomen is flat.  ?   Palpations: Abdomen is soft.  ?Neurological:  ?   Mental Status: He is alert.  ? ? ?  ? ?Imaging: ?MR ABDOMEN WWO CONTRAST ? ?Result Date: 05/16/2021 ?CLINICAL DATA:  Indeterminate right renal lesion on recent CT. EXAM: MRI ABDOMEN WITHOUT AND WITH CONTRAST TECHNIQUE: Multiplanar multisequence MR imaging of the abdomen was performed both before and after the administration of intravenous contrast. CONTRAST:  44m MULTIHANCE GADOBENATE DIMEGLUMINE 529 MG/ML IV SOLN COMPARISON:  CT on 04/15/2021 FINDINGS: Lower chest: No acute findings. Hepatobiliary: No hepatic masses identified. Gallbladder is unremarkable. No evidence of biliary ductal dilatation. Pancreas:  No mass or inflammatory changes. Spleen:  Within normal limits in size and appearance. Adrenals/Urinary Tract: A few tiny sub-cm simple cysts are seen in the right kidney. A sub-cm benign Bosniak category 2 hemorrhagic cyst is seen in the  lower pole the left kidney. A 1.2 cm lesion is seen in the posterior midpole the right kidney which shows T1 isointensity and T2 hypointensity. This shows evidence of subtle low-level enhancement on subtraction

## 2021-06-03 ENCOUNTER — Other Ambulatory Visit (HOSPITAL_COMMUNITY): Payer: Self-pay

## 2021-06-06 ENCOUNTER — Ambulatory Visit: Payer: Medicaid Other | Admitting: Infectious Disease

## 2021-06-07 ENCOUNTER — Other Ambulatory Visit (HOSPITAL_COMMUNITY): Payer: Self-pay | Admitting: Diagnostic Radiology

## 2021-06-07 DIAGNOSIS — N2889 Other specified disorders of kidney and ureter: Secondary | ICD-10-CM

## 2021-06-13 ENCOUNTER — Other Ambulatory Visit (HOSPITAL_COMMUNITY): Payer: Self-pay

## 2021-06-14 ENCOUNTER — Other Ambulatory Visit: Payer: Self-pay | Admitting: Radiology

## 2021-06-14 NOTE — Progress Notes (Signed)
Sent message, via epic in basket, requesting orders in epic from surgeon.  

## 2021-06-15 ENCOUNTER — Other Ambulatory Visit (HOSPITAL_COMMUNITY): Payer: Self-pay

## 2021-06-15 ENCOUNTER — Encounter: Payer: Self-pay | Admitting: Infectious Disease

## 2021-06-15 ENCOUNTER — Ambulatory Visit (INDEPENDENT_AMBULATORY_CARE_PROVIDER_SITE_OTHER): Payer: Medicaid Other | Admitting: Infectious Disease

## 2021-06-15 ENCOUNTER — Other Ambulatory Visit: Payer: Self-pay

## 2021-06-15 VITALS — BP 163/103 | HR 71 | Temp 98.1°F | Ht 72.0 in | Wt 155.0 lb

## 2021-06-15 DIAGNOSIS — B20 Human immunodeficiency virus [HIV] disease: Secondary | ICD-10-CM

## 2021-06-15 DIAGNOSIS — I5033 Acute on chronic diastolic (congestive) heart failure: Secondary | ICD-10-CM

## 2021-06-15 DIAGNOSIS — N189 Chronic kidney disease, unspecified: Secondary | ICD-10-CM | POA: Diagnosis not present

## 2021-06-15 DIAGNOSIS — N289 Disorder of kidney and ureter, unspecified: Secondary | ICD-10-CM | POA: Diagnosis not present

## 2021-06-15 DIAGNOSIS — N183 Chronic kidney disease, stage 3 unspecified: Secondary | ICD-10-CM

## 2021-06-15 DIAGNOSIS — C649 Malignant neoplasm of unspecified kidney, except renal pelvis: Secondary | ICD-10-CM

## 2021-06-15 DIAGNOSIS — I480 Paroxysmal atrial fibrillation: Secondary | ICD-10-CM | POA: Diagnosis not present

## 2021-06-15 DIAGNOSIS — I25110 Atherosclerotic heart disease of native coronary artery with unstable angina pectoris: Secondary | ICD-10-CM | POA: Diagnosis not present

## 2021-06-15 HISTORY — DX: Malignant neoplasm of unspecified kidney, except renal pelvis: C64.9

## 2021-06-15 MED ORDER — DOVATO 50-300 MG PO TABS
1.0000 | ORAL_TABLET | Freq: Every day | ORAL | 11 refills | Status: DC
  Filled 2021-06-15 (×2): qty 30, 30d supply, fill #0
  Filled 2021-07-12: qty 30, 30d supply, fill #1

## 2021-06-15 NOTE — Progress Notes (Signed)
? ?Subjective:  ?Chief complaint follow-up for HIV disease on BIKTARVY ? Patient ID: Kevin [name], male    DOB: 08/23/1955, 66 y.o.   MRN: [id number] ? ?HPI ? 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 ? ?He is scheduled for ablation of this later this month. ? ?His blood pressure was quite high today in clinic but had not taken his medications. ? ?I counseled him again that optimizing blood pressure control was critical to health of his kidneys and overall health. ? ? ? ?Past Medical History:  ?Diagnosis Date  ? AIDS (acquired immune deficiency syndrome) ([place]) 08/17/2016  ? Chronic diastolic CHF (congestive heart failure), NYHA class 3 ([place]) 01/2016  ? Chronic lower back pain   ? CKD (chronic kidney disease), stage III ([place])   ? COPD (chronic obstructive pulmonary disease) ([place])   ? Gout   ? "forearms, hands, ankles, feet" (06/05/2016)  ? Headache   ? "weekly" (06/05/2016)  ? Heart murmur   ? Hypertension   ? Hypertensive crisis 08/15/2017  ? OSA on CPAP   ? PAD (peripheral artery disease) ([place])   ? PAF (paroxysmal atrial fibrillation) ([place]) 01/2016  ? ? ?Past Surgical History:  ?Procedure Laterality Date  ? IR RADIOLOGIST EVAL & MGMT  05/31/2021  ? LEFT HEART CATH AND CORONARY ANGIOGRAPHY N/A 10/23/2016  ? Procedure: LEFT HEART CATH AND CORONARY ANGIOGRAPHY;  Surgeon: Leonie Man, MD;  Location: [place] [place] [facility];  Service: Cardiovascular;  Laterality: N/A;  ? LOWER EXTREMITY ANGIOGRAPHY N/A 07/17/2016  ? Procedure: Lower Extremity Angiography;  Surgeon: Lorretta Harp, MD;  Location: [place] [facility];  Service: Cardiovascular;  Laterality: N/A;  ? LOWER EXTREMITY INTERVENTION N/A 06/05/2016  ? Procedure: Lower Extremity Intervention;  Surgeon:  Lorretta Harp, MD;  Location: [place] [facility];  Service: Cardiovascular;  Laterality: N/A;  ? PERIPHERAL VASCULAR ATHERECTOMY Right 07/17/2016  ? Procedure: Peripheral Vascular Atherectomy;  Surgeon: Lorretta Harp, MD;  Location: [place] [facility];  Service: Cardiovascular;  Laterality: Right;  SFA  ? PERIPHERAL VASCULAR INTERVENTION  06/05/2016  ? Procedure: Peripheral Vascular Intervention;  Surgeon: Lorretta Harp, MD;  Location: [facility];  Service: Cardiovascular;;  left SFA  ? ? ?Family History  ?Problem Relation Age of Onset  ? High blood pressure Mother   ? Lupus Mother   ? ? ?  ?Social History  ? ?Socioeconomic History  ? Marital status: Significant Other  ?  Spouse name: Not on file  ? Number of children: Not on file  ? Years of education: Not on file  ? Highest education level: Not on file  ?Occupational History  ? Not on file  ?Tobacco Use  ? Smoking status: Every Day  ?  Packs/day: 0.50  ?  Years: 42.00  ?  Pack years: 21.00  ?  Types: Cigarettes  ? Smokeless tobacco: Never  ? Tobacco comments:  ?  4 a day  ?Vaping Use  ? Vaping Use: Never used  ?Substance and Sexual Activity  ? Alcohol use: Yes  ?  Alcohol/week: 2.0 standard drinks  ?  Types: 2 Cans of beer per week  ? Drug use: No  ? Sexual activity: Not Currently  ?  Comment: offered condoms  ?Other  Topics Concern  ? Not on file  ?Social History Narrative  ? Not on file  ? ?Social Determinants of Health  ? ?Financial Resource Strain: Not on file  ?Food Insecurity: Not on file  ?Transportation Needs: Not on file  ?Physical Activity: Not on file  ?Stress: Not on file  ?Social Connections: Not on file  ? ? ?No Known Allergies ? ? ?Current Outpatient Medications:  ?  albuterol (PROVENTIL) (2.5 MG/3ML) 0.083% nebulizer solution, Take 3 mLs (2.5 mg total) by nebulization every 4 (four) hours as needed for wheezing or shortness of breath., Disp: 225 mL, Rfl: 2 ?  albuterol (VENTOLIN HFA) 108 (90 Base) MCG/ACT inhaler, Inhale 2 puffs into  the lungs every 6 (six) hours as needed for wheezing or shortness of breath., Disp: 18 g, Rfl: 0 ?  amLODipine (NORVASC) 10 MG tablet, 1 tablet, Disp: , Rfl:  ?  amLODipine (NORVASC) 5 MG tablet, Take 5 mg by mouth daily. Take 1 tablet by mouth daily. (Patient not taking: Reported on 04/29/2021), Disp: , Rfl:  ?  bictegravir-emtricitabine-tenofovir AF (BIKTARVY) 50-200-25 MG TABS tablet, Take 1 tablet by mouth daily., Disp: 30 tablet, Rfl: 11 ?  carvedilol (COREG) 6.25 MG tablet, Take 1 tablet (6.25 mg total) by mouth 2 (two) times daily with a meal. (Patient taking differently: Take 6.25 mg by mouth daily.), Disp: 60 tablet, Rfl: 0 ?  hydrALAZINE (APRESOLINE) 50 MG tablet, Take 1 tablet (50 mg total) by mouth 3 (three) times daily., Disp: 90 tablet, Rfl: 0 ?  mometasone-formoterol (DULERA) 100-5 MCG/ACT AERO, Inhale 2 puffs into the lungs in the morning and at bedtime., Disp: 13 g, Rfl: 0 ?  Multiple Vitamin (MULTIVITAMIN) tablet, Take 1 tablet by mouth daily., Disp: , Rfl:  ?  predniSONE (DELTASONE) 20 MG tablet, 3 tabs daily x3 days, then 2 tabs daily x3 days, then 1 tab daily x3 days, then one half tab daily x3 days, then stop, Disp: 20 tablet, Rfl: 0 ? ? ? ?Review of Systems  ?Constitutional:  Negative for activity change, appetite change, chills, diaphoresis, fatigue, fever and unexpected weight change.  ?HENT:  Negative for congestion, rhinorrhea, sinus pressure, sneezing, sore throat and trouble swallowing.   ?Eyes:  Negative for photophobia and visual disturbance.  ?Respiratory:  Negative for cough, chest tightness, shortness of breath, wheezing and stridor.   ?Cardiovascular:  Negative for chest pain, palpitations and leg swelling.  ?Gastrointestinal:  Negative for abdominal distention, abdominal pain, anal bleeding, blood in stool, constipation, diarrhea, nausea and vomiting.  ?Genitourinary:  Negative for difficulty urinating, dysuria, flank pain and hematuria.  ?Musculoskeletal:  Negative for  arthralgias, back pain, gait problem, joint swelling and myalgias.  ?Skin:  Negative for color change, pallor, rash and wound.  ?Neurological:  Negative for dizziness, tremors, weakness and light-headedness.  ?Hematological:  Negative for adenopathy. Does not bruise/bleed easily.  ?Psychiatric/Behavioral:  Negative for agitation, behavioral problems, confusion, decreased concentration, dysphoric mood and sleep disturbance.   ? ?   ?Objective:  ? Physical Exam ?Constitutional:   ?   Appearance: He is well-developed.  ?HENT:  ?   Head: Normocephalic and atraumatic.  ?Eyes:  ?   Conjunctiva/sclera: Conjunctivae normal.  ?Cardiovascular:  ?   Rate and Rhythm: Normal rate and regular rhythm.  ?Pulmonary:  ?   Effort: Pulmonary effort is normal. No respiratory distress.  ?   Breath sounds: No wheezing.  ?Abdominal:  ?   General: There is no distension.  ?   Palpations: Abdomen is  soft.  ?Musculoskeletal:     ?   General: No tenderness. Normal range of motion.  ?   Cervical back: Normal range of motion and neck supple.  ?Skin: ?   General: Skin is warm and dry.  ?   Coloration: Skin is not pale.  ?   Findings: No erythema or rash.  ?Neurological:  ?   General: No focal deficit present.  ?   Mental Status: He is alert and oriented to person, place, and time.  ?Psychiatric:     ?   Mood and Affect: Mood normal.     ?   Behavior: Behavior normal.     ?   Thought Content: Thought content normal.     ?   Judgment: Judgment normal.  ? ? ? ? ? ?   ?Assessment & Plan:  ? ?HIV disease:  ? ?Will check labs today ? ?Given his impending ablation and his CKD , lack of hepatitis B coinfection or resistance I will switch him over to [facility] and we will recheck labs when he comes back to see me in June. ? 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 ? ?Hypertension: Not well controlled in clinic but did not take medications we will follow-up with PCP all ? ?CHF and PAF  following with  cardiology. ? ? ?

## 2021-06-16 LAB — T-HELPER CELLS (CD4) COUNT (NOT AT ARMC)
CD4 % Helper T Cell: 44 % (ref 33–65)
CD4 T Cell Abs: 918 /uL (ref 400–1790)

## 2021-06-19 LAB — CBC WITH DIFFERENTIAL/PLATELET
Absolute Monocytes: 604 cells/uL (ref 200–950)
Basophils Absolute: 10 cells/uL (ref 0–200)
Basophils Relative: 0.1 %
Eosinophils Absolute: 10 cells/uL — ABNORMAL LOW (ref 15–500)
Eosinophils Relative: 0.1 %
HCT: 37.5 % — ABNORMAL LOW (ref 38.5–50.0)
Hemoglobin: 12.9 g/dL — ABNORMAL LOW (ref 13.2–17.1)
Lymphs Abs: 2445 cells/uL (ref 850–3900)
MCH: 31.8 pg (ref 27.0–33.0)
MCHC: 34.4 g/dL (ref 32.0–36.0)
MCV: 92.4 fL (ref 80.0–100.0)
MPV: 10.4 fL (ref 7.5–12.5)
Monocytes Relative: 6.1 %
Neutro Abs: 6831 cells/uL (ref 1500–7800)
Neutrophils Relative %: 69 %
Platelets: 322 10*3/uL (ref 140–400)
RBC: 4.06 10*6/uL — ABNORMAL LOW (ref 4.20–5.80)
RDW: 13.6 % (ref 11.0–15.0)
Total Lymphocyte: 24.7 %
WBC: 9.9 10*3/uL (ref 3.8–10.8)

## 2021-06-19 LAB — COMPLETE METABOLIC PANEL WITH GFR
AG Ratio: 1.1 (calc) (ref 1.0–2.5)
ALT: 9 U/L (ref 9–46)
AST: 13 U/L (ref 10–35)
Albumin: 3.9 g/dL (ref 3.6–5.1)
Alkaline phosphatase (APISO): 63 U/L (ref 35–144)
BUN/Creatinine Ratio: 19 (calc) (ref 6–22)
BUN: 39 mg/dL — ABNORMAL HIGH (ref 7–25)
CO2: 28 mmol/L (ref 20–32)
Calcium: 9.1 mg/dL (ref 8.6–10.3)
Chloride: 107 mmol/L (ref 98–110)
Creat: 2.01 mg/dL — ABNORMAL HIGH (ref 0.70–1.35)
Globulin: 3.6 g/dL (calc) (ref 1.9–3.7)
Glucose, Bld: 81 mg/dL (ref 65–99)
Potassium: 4.4 mmol/L (ref 3.5–5.3)
Sodium: 141 mmol/L (ref 135–146)
Total Bilirubin: 0.5 mg/dL (ref 0.2–1.2)
Total Protein: 7.5 g/dL (ref 6.1–8.1)
eGFR: 37 mL/min/{1.73_m2} — ABNORMAL LOW (ref >=60)

## 2021-06-19 LAB — LIPID PANEL
Cholesterol: 211 mg/dL — ABNORMAL HIGH (ref <200)
HDL: 78 mg/dL (ref >=40)
LDL Cholesterol (Calc): 113 mg/dL (calc) — ABNORMAL HIGH
Non-HDL Cholesterol (Calc): 133 mg/dL (calc) — ABNORMAL HIGH (ref <130)
Total CHOL/HDL Ratio: 2.7 (calc) (ref <5.0)
Triglycerides: 94 mg/dL (ref <150)

## 2021-06-19 LAB — HIV RNA, RTPCR W/R GT (RTI, PI,INT)
HIV 1 RNA Quant: NOT DETECTED copies/mL
HIV-1 RNA Quant, Log: NOT DETECTED Log copies/mL

## 2021-06-19 LAB — RPR: RPR Ser Ql: NONREACTIVE

## 2021-06-20 NOTE — Patient Instructions (Addendum)
DUE TO COVID-19 ONLY TWO VISITORS  (aged 64 and older)  ARE ALLOWED TO COME WITH YOU AND STAY IN THE WAITING ROOM ONLY DURING PRE OP AND PROCEDURE.   ?**NO VISITORS ARE ALLOWED IN THE SHORT STAY AREA OR RECOVERY ROOM!!** ? ?IF YOU WILL BE ADMITTED INTO THE HOSPITAL YOU ARE ALLOWED ONLY FOUR SUPPORT PEOPLE DURING VISITATION HOURS ONLY (7 AM -8PM)   ?The support person(s) must pass our screening, gel in and out, and wear a mask at all times, including in the patient?s room. ?Patients must also wear a mask when staff or their support person are in the room. ?Visitors GUEST BADGE MUST BE WORN VISIBLY  ?One adult visitor may remain with you overnight and MUST be in the room by 8 P.M. ?  ? ? Your procedure is scheduled on: 06/29/21 ? ? Report to [facility] Main Entrance ? ?  Report to admitting at  9:30 AM ? ? Call this number if you have problems the morning of surgery [phone] ? ? Do not eat food or drink:After [place]. ? ? ?  ? ?  ?       If you have questions, please contact your surgeon?s office. ? ? ?  ?  ?Oral Hygiene is also important to reduce your risk of infection.                                    ?Remember - BRUSH YOUR TEETH THE MORNING OF SURGERY WITH YOUR REGULAR TOOTHPASTE ? ? Do NOT smoke after Midnight ? ? Take these medicines the morning of surgery with A SIP OF WATER: Biktarvy, Amlodipine ? ? ?Bring CPAP mask and tubing day of surgery. ?                  ?           You may not have any metal on your body including  jewelry, and body piercing ? ?           Do not wear  lotions, powders, /cologne, or deodorant ? ? ?            Men may shave face and neck. ? ? Do not bring valuables to the hospital. [facility] NOT ?            RESPONSIBLE   FOR VALUABLES. ? ? Contacts, dentures or bridgework may not be worn into surgery. ? ? Bring small overnight bag day of surgery. ?  ? ? Special Instructions: Bring a copy of your healthcare power of attorney and living will documentsthe day of  surgery if you haven't scanned them before. ? ?            Please read over the following fact sheets you were given: IF [place] [phone] ? ?   [place] - Preparing for Surgery ?Before surgery, you can play an important role.  Because skin is not sterile, your skin needs to be as free of germs as possible.  You can reduce the number of germs on your skin by washing with CHG (chlorahexidine gluconate) soap before surgery.  CHG is an antiseptic cleaner which kills germs and bonds with the skin to continue killing germs even after washing. ?Please DO NOT use if you have an allergy to CHG or antibacterial soaps.  If your skin becomes  reddened/irritated stop using the CHG and inform your nurse when you arrive at Short Stay.  You may shave your face/neck. ?Please follow these instructions carefully: ? 1.  Shower with CHG Soap the night before surgery and the  morning of Surgery. ? 2.  If you choose to wash your hair, wash your hair first as usual with your  normal  shampoo. ? 3.  After you shampoo, rinse your hair and body thoroughly to remove the  shampoo.                      ?      4.  Use CHG as you would any other liquid soap.  You can apply chg directly  to the skin and wash  ?                     Gently with a scrungie or clean washcloth. ? 5.  Apply the CHG Soap to your body ONLY FROM THE NECK DOWN.   Do not use on face/ open      ?                     Wound or open sores. Avoid contact with eyes, ears mouth and genitals (private parts).  ?                     Production manager,  Genitals (private parts) with your normal soap. ?            6.  Wash thoroughly, paying special attention to the area where your surgery  will be performed. ? 7.  Thoroughly rinse your body with warm water from the neck down. ? 8.  DO NOT shower/wash with your normal soap after using and rinsing off  the CHG Soap. ?               9.  Pat yourself dry with a clean towel. ?           10.   Wear clean pajamas. ?           11.  Place clean sheets on your bed the night of your first shower and do not  sleep with pets. ?Day of Surgery : ?Do not apply any lotions/deodorants the morning of surgery.  Please wear clean clothes to the hospital/surgery center. ? ?FAILURE TO FOLLOW THESE INSTRUCTIONS MAY RESULT IN THE CANCELLATION OF YOUR SURGERY ? ? ?________________________________________________________________________  ?

## 2021-06-21 ENCOUNTER — Encounter (HOSPITAL_COMMUNITY)
Admission: RE | Admit: 2021-06-21 | Discharge: 2021-06-21 | Disposition: A | Discharge status: Home / Self Care | Payer: Medicaid Other | Source: Ambulatory Visit | Attending: Diagnostic Radiology | Admitting: Diagnostic Radiology

## 2021-06-21 ENCOUNTER — Encounter (HOSPITAL_COMMUNITY): Payer: Self-pay

## 2021-06-21 ENCOUNTER — Other Ambulatory Visit: Payer: Self-pay

## 2021-06-21 DIAGNOSIS — Z01818 Encounter for other preprocedural examination: Secondary | ICD-10-CM | POA: Diagnosis present

## 2021-06-21 LAB — PROTIME-INR
INR: 1.1 (ref 0.8–1.2)
Prothrombin Time: 14 seconds (ref 11.4–15.2)

## 2021-06-21 NOTE — Progress Notes (Signed)
Anesthesia note: ? ?Bowel prep reminder:NA ? ?PCP - Dr. Clayburn Pert ?Cardiologist -Dr. Alma Friendly ?Other- Infectious diseases-Dr. Priscille Heidelberg ? ?Chest x-ray - 04/19/21-epic ?EKG - 04/19/21 -epic and chart ?Stress Test - 2021-epic ?ECHO - 12/01/20-epic ?Cardiac Cath - 10/23/16-epic ? ?Pacemaker/ICD device last checked:NA ? ?Sleep Study - yes ?CPAP - yes ? ?Pt is pre diabetic-NA ?Fasting Blood Sugar -  ?Checks Blood Sugar _____ ? ?Blood Thinner:NA ?Blood Thinner Instructions: ?Aspirin Instructions: ?Last Dose: ? ?Anesthesia review: yes ? ?Patient denies shortness of breath, fever, cough and chest pain at PAT appointment ?Pt has no SOB with any activities.  ? ?Patient verbalized understanding of instructions that were given to them at the PAT appointment. Patient was also instructed that they will need to review over the PAT instructions again at home before surgery. yes ?

## 2021-06-22 NOTE — Anesthesia Preprocedure Evaluation (Addendum)
Anesthesia Evaluation  ?Patient identified by MRN, date of birth, ID band ?Patient awake ? ? ? ?Reviewed: ?Allergy & Precautions, H&P , NPO status , Patient's Chart, lab work & pertinent test results ? ?Airway ?Mallampati: I ? ?TM Distance: >3 FB ?Neck ROM: Full ? ? ? Dental ?no notable dental hx. ?(+) Edentulous Upper, Edentulous Lower, Dental Advisory Given ?  ?Pulmonary ?sleep apnea and Continuous Positive Airway Pressure Ventilation , COPD,  COPD inhaler, Current Smoker and Patient abstained from smoking.,  ?  ?Pulmonary exam normal ?breath sounds clear to auscultation ? ? ? ? ? ? Cardiovascular ?hypertension, Pt. on medications ?+ Peripheral Vascular Disease  ?+ dysrhythmias Atrial Fibrillation  ?Rhythm:Regular Rate:Normal ? ? ?  ?Neuro/Psych ? Headaches, negative psych ROS  ? GI/Hepatic ?negative GI ROS, Neg liver ROS,   ?Endo/Other  ?negative endocrine ROS ? Renal/GU ?Renal InsufficiencyRenal disease  ?negative genitourinary ?  ?Musculoskeletal ? ? Abdominal ?  ?Peds ? Hematology ? ?(+) HIV,   ?Anesthesia Other Findings ? ? Reproductive/Obstetrics ?negative OB ROS ? ?  ? ? ? ? ? ? ? ? ? ? ? ? ? ?  ?  ? ? ? ? ? ? ?Anesthesia Physical ?Anesthesia Plan ? ?ASA: 3 ? ?Anesthesia Plan: General  ? ?Post-op Pain Management: Tylenol PO (pre-op)*  ? ?Induction: Intravenous ? ?PONV Risk Score and Plan: 2 and Ondansetron, Dexamethasone and Treatment may vary due to age or medical condition ? ?Airway Management Planned: Oral ETT ? ?Additional Equipment:  ? ?Intra-op Plan:  ? ?Post-operative Plan: Extubation in OR ? ?Informed Consent: I have reviewed the patients History and Physical, chart, labs and discussed the procedure including the risks, benefits and alternatives for the proposed anesthesia with the patient or authorized representative who has indicated his/her understanding and acceptance.  ? ? ? ?Dental advisory given ? ?Plan Discussed with: CRNA ? ?Anesthesia Plan Comments: (See  PAT note 06/21/2021)  ? ? ? ? ? ?Anesthesia Quick Evaluation ? ?

## 2021-06-22 NOTE — Progress Notes (Signed)
Anesthesia Chart Review ? ? Case: [id number] Date/Time: 06/29/21 1130  ? Procedure: RIGHT RENAL CRYOABLATION (Right)  ? Anesthesia type: General  ? Pre-op diagnosis: RRIGHT RENAL MASS  ? Location: [facility] / [facility]  ? Surgeons: Markus Daft, MD  ? ?  ? ? ?DISCUSSION:66 y.o. smoker with h/o HIV (follows with ID, well controlled on [place], last seen 06/15/2021), HTN, PAD, PAF not anticoagulated, CHF, OSA, COPD stable at PAT visit, CKD Stage III, right renal mass scheduled for above procedure 06/29/2021 with Dr. Markus Daft.  ? ?Pt last seen by cardiology 04/29/2021, stable at this visit.  ? ?Elevated BP last ID visit, but pt reported he had not taken his BP medications.  At PAT visit BP 148/88. Evaluate DOS.  ? ?BP Readings from Last 3 Encounters:  ?06/15/21 (!) 163/103  ?05/31/21 (!) 205/115  ?05/13/21 (!) 159/97  ?  ?Anticipate pt can proceed with planned procedure barring acute status change and after evaluation DOS.  BP and COPD evaluate DOS, stable at PAT visit.  ?VS: Ht 6' (1.829 m)   Wt 72.6 kg   BMI 21.70 kg/m?  ? ?PROVIDERS: ?Lucianne Lei, MD is PCP  ? ?Lyman Bishop, MD is Cardiologist  ?LABS: Labs reviewed: Acceptable for surgery. ?(all labs ordered are listed, but only abnormal results are displayed) ? ?Labs Reviewed  ?PROTIME-INR  ?TYPE AND SCREEN  ? ? ? ?IMAGES: ? ? ?EKG: ?06/21/2021 ?Rate 61 bpm  ?Normal sinus rhythm ?Moderate voltage criteria for LVH, may be normal variant ( Sokolow-Lyon , Cornell product ) ?Septal infarct , age undetermined ?ST & T wave abnormality, consider inferior ischemia ?Abnormal ECG ?When compared with ECG of 19-Apr-2021 14:18, ?PREVIOUS ECG IS PRESENT ? ?CV: ?Echo 12/01/2020 ?1. Left ventricular ejection fraction, by estimation, is 55 to 60%. The  ?left ventricle has normal function. The left ventricle has no regional  ?wall motion abnormalities. There is mild concentric left ventricular  ?hypertrophy. Left ventricular diastolic  ?parameters are consistent with Grade I diastolic  dysfunction (impaired  ?relaxation).  ? 2. Right ventricular systolic function is normal. The right ventricular  ?size is normal.  ? 3. The mitral valve is normal in structure. No evidence of mitral valve  ?regurgitation. No evidence of mitral stenosis.  ? 4. The aortic valve is normal in structure. Aortic valve regurgitation is  ?not visualized. No aortic stenosis is present.  ? 5. The inferior vena cava is normal in size with greater than 50%  ?respiratory variability, suggesting right atrial pressure of 3 mmHg.  ?Past Medical History:  ?Diagnosis Date  ? AIDS (acquired immune deficiency syndrome) ([place]) 08/17/2016  ? Chronic diastolic CHF (congestive heart failure), NYHA class 3 ([place]) 01/2016  ? Chronic lower back pain   ? CKD (chronic kidney disease), stage III ([place])   ? COPD (chronic obstructive pulmonary disease) ([place])   ? Gout   ? "forearms, hands, ankles, feet" (06/05/2016)  ? Headache   ? "weekly" (06/05/2016)  ? Heart murmur   ? Hypertension   ? Hypertensive crisis 08/15/2017  ? OSA on CPAP   ? PAD (peripheral artery disease) ([place])   ? PAF (paroxysmal atrial fibrillation) ([place]) 01/2016  ? 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 ? ?Past Surgical History:  ?Procedure Laterality Date  ? IR RADIOLOGIST EVAL & MGMT  05/31/2021  ? LEFT HEART CATH AND CORONARY ANGIOGRAPHY N/A 10/23/2016  ? Procedure: LEFT HEART CATH AND CORONARY ANGIOGRAPHY;  Surgeon: Leonie Man, MD;  Location: [facility];  Service: Cardiovascular;  Laterality: N/A;  ? LOWER EXTREMITY ANGIOGRAPHY N/A 07/17/2016  ? Procedure: Lower Extremity Angiography;  Surgeon: Lorretta Harp, MD;  Location: [facility];  Service: Cardiovascular;  Laterality: N/A;  ? LOWER EXTREMITY INTERVENTION N/A 06/05/2016  ? Procedure: Lower Extremity Intervention;  Surgeon: Lorretta Harp, MD;  Location: [facility];  Service: Cardiovascular;  Laterality: N/A;  ? PERIPHERAL VASCULAR ATHERECTOMY Right 07/17/2016  ? Procedure: Peripheral Vascular  Atherectomy;  Surgeon: Lorretta Harp, MD;  Location: [facility];  Service: Cardiovascular;  Laterality: Right;  SFA  ? PERIPHERAL VASCULAR INTERVENTION  06/05/2016  ? Procedure: Peripheral Vascular Intervention;  Surgeon: Lorretta Harp, MD;  Location: [facility];  Service: Cardiovascular;;  left SFA  ? ? ?MEDICATIONS: ? albuterol (PROVENTIL) (2.5 MG/3ML) 0.083% nebulizer solution  ? albuterol (VENTOLIN HFA) 108 (90 Base) MCG/ACT inhaler  ? amLODipine (NORVASC) 10 MG tablet  ? bictegravir-emtricitabine-tenofovir AF (BIKTARVY) 50-200-25 MG TABS tablet  ? dolutegravir-lamiVUDine (DOVATO) 50-300 MG tablet  ? mometasone-formoterol (DULERA) 100-5 MCG/ACT AERO  ? Multiple Vitamin (MULTIVITAMIN) tablet  ? valsartan-hydrochlorothiazide (DIOVAN-HCT) 80-12.5 MG tablet  ? ?No current facility-administered medications for this encounter.  ? ? ? ?Konrad Felix Ward, PA-C ?[facility] ?[phone] ? ? ? ? ? ?

## 2021-06-28 ENCOUNTER — Other Ambulatory Visit: Payer: Self-pay | Admitting: Radiology

## 2021-06-28 DIAGNOSIS — N2889 Other specified disorders of kidney and ureter: Secondary | ICD-10-CM

## 2021-06-29 ENCOUNTER — Observation Stay (HOSPITAL_COMMUNITY)
Admission: RE | Admit: 2021-06-29 | Discharge: 2021-06-30 | Disposition: A | Discharge status: Home / Self Care | Payer: Medicaid Other | Attending: Diagnostic Radiology | Admitting: Diagnostic Radiology

## 2021-06-29 ENCOUNTER — Other Ambulatory Visit: Payer: Self-pay | Admitting: Radiology

## 2021-06-29 ENCOUNTER — Ambulatory Visit (HOSPITAL_BASED_OUTPATIENT_CLINIC_OR_DEPARTMENT_OTHER): Payer: Medicaid Other | Admitting: Certified Registered Nurse Anesthetist

## 2021-06-29 ENCOUNTER — Ambulatory Visit (HOSPITAL_COMMUNITY)
Admission: RE | Admit: 2021-06-29 | Discharge: 2021-06-29 | Disposition: A | Discharge status: Home / Self Care | Payer: Medicaid Other | Source: Ambulatory Visit | Attending: Diagnostic Radiology | Admitting: Diagnostic Radiology

## 2021-06-29 ENCOUNTER — Other Ambulatory Visit: Payer: Self-pay

## 2021-06-29 ENCOUNTER — Ambulatory Visit (HOSPITAL_COMMUNITY): Payer: Medicaid Other | Admitting: Physician Assistant

## 2021-06-29 ENCOUNTER — Encounter (HOSPITAL_COMMUNITY)
Admission: RE | Disposition: A | Discharge status: Home / Self Care | Payer: Self-pay | Source: Home / Self Care | Attending: Diagnostic Radiology

## 2021-06-29 ENCOUNTER — Ambulatory Visit (HOSPITAL_COMMUNITY): Payer: Medicaid Other

## 2021-06-29 ENCOUNTER — Encounter (HOSPITAL_COMMUNITY): Payer: Self-pay | Admitting: Diagnostic Radiology

## 2021-06-29 DIAGNOSIS — Z79899 Other long term (current) drug therapy: Secondary | ICD-10-CM | POA: Diagnosis not present

## 2021-06-29 DIAGNOSIS — I10 Essential (primary) hypertension: Secondary | ICD-10-CM

## 2021-06-29 DIAGNOSIS — N289 Disorder of kidney and ureter, unspecified: Secondary | ICD-10-CM

## 2021-06-29 DIAGNOSIS — I739 Peripheral vascular disease, unspecified: Secondary | ICD-10-CM

## 2021-06-29 DIAGNOSIS — G4733 Obstructive sleep apnea (adult) (pediatric): Secondary | ICD-10-CM | POA: Insufficient documentation

## 2021-06-29 DIAGNOSIS — I5032 Chronic diastolic (congestive) heart failure: Secondary | ICD-10-CM | POA: Insufficient documentation

## 2021-06-29 DIAGNOSIS — I480 Paroxysmal atrial fibrillation: Secondary | ICD-10-CM | POA: Insufficient documentation

## 2021-06-29 DIAGNOSIS — N183 Chronic kidney disease, stage 3 unspecified: Secondary | ICD-10-CM | POA: Diagnosis not present

## 2021-06-29 DIAGNOSIS — N2889 Other specified disorders of kidney and ureter: Secondary | ICD-10-CM

## 2021-06-29 DIAGNOSIS — I130 Hypertensive heart and chronic kidney disease with heart failure and stage 1 through stage 4 chronic kidney disease, or unspecified chronic kidney disease: Secondary | ICD-10-CM | POA: Insufficient documentation

## 2021-06-29 DIAGNOSIS — J449 Chronic obstructive pulmonary disease, unspecified: Secondary | ICD-10-CM | POA: Insufficient documentation

## 2021-06-29 DIAGNOSIS — Z21 Asymptomatic human immunodeficiency virus [HIV] infection status: Secondary | ICD-10-CM | POA: Insufficient documentation

## 2021-06-29 DIAGNOSIS — C641 Malignant neoplasm of right kidney, except renal pelvis: Secondary | ICD-10-CM | POA: Diagnosis not present

## 2021-06-29 HISTORY — PX: RADIOLOGY WITH ANESTHESIA: SHX6223

## 2021-06-29 LAB — ABO/RH: ABO/RH(D): O POS

## 2021-06-29 LAB — CBC WITH DIFFERENTIAL/PLATELET
Abs Immature Granulocytes: 0.06 10*3/uL (ref 0.00–0.07)
Basophils Absolute: 0 10*3/uL (ref 0.0–0.1)
Basophils Relative: 0 %
Eosinophils Absolute: 0.1 10*3/uL (ref 0.0–0.5)
Eosinophils Relative: 1 %
HCT: 32.3 % — ABNORMAL LOW (ref 39.0–52.0)
Hemoglobin: 10.7 g/dL — ABNORMAL LOW (ref 13.0–17.0)
Immature Granulocytes: 1 %
Lymphocytes Relative: 24 %
Lymphs Abs: 3.2 10*3/uL (ref 0.7–4.0)
MCH: 31.6 pg (ref 26.0–34.0)
MCHC: 33.1 g/dL (ref 30.0–36.0)
MCV: 95.3 fL (ref 80.0–100.0)
Monocytes Absolute: 0.7 10*3/uL (ref 0.1–1.0)
Monocytes Relative: 5 %
Neutro Abs: 9.1 10*3/uL — ABNORMAL HIGH (ref 1.7–7.7)
Neutrophils Relative %: 69 %
Platelets: 315 10*3/uL (ref 150–400)
RBC: 3.39 MIL/uL — ABNORMAL LOW (ref 4.22–5.81)
RDW: 15.1 % (ref 11.5–15.5)
WBC: 13.1 10*3/uL — ABNORMAL HIGH (ref 4.0–10.5)
nRBC: 0 % (ref 0.0–0.2)

## 2021-06-29 LAB — URINALYSIS, COMPLETE (UACMP) WITH MICROSCOPIC
Bilirubin Urine: NEGATIVE
Glucose, UA: NEGATIVE mg/dL
Hgb urine dipstick: NEGATIVE
Ketones, ur: NEGATIVE mg/dL
Nitrite: NEGATIVE
Protein, ur: NEGATIVE mg/dL
Specific Gravity, Urine: 1.012 (ref 1.005–1.030)
pH: 5 (ref 5.0–8.0)

## 2021-06-29 LAB — TYPE AND SCREEN
ABO/RH(D): O POS
Antibody Screen: NEGATIVE

## 2021-06-29 LAB — BASIC METABOLIC PANEL
Anion gap: 5 (ref 5–15)
BUN: 32 mg/dL — ABNORMAL HIGH (ref 8–23)
CO2: 23 mmol/L (ref 22–32)
Calcium: 8.7 mg/dL — ABNORMAL LOW (ref 8.9–10.3)
Chloride: 113 mmol/L — ABNORMAL HIGH (ref 98–111)
Creatinine, Ser: 2.02 mg/dL — ABNORMAL HIGH (ref 0.61–1.24)
GFR, Estimated: 36 mL/min — ABNORMAL LOW (ref >60)
Glucose, Bld: 87 mg/dL (ref 70–99)
Potassium: 3.8 mmol/L (ref 3.5–5.1)
Sodium: 141 mmol/L (ref 135–145)

## 2021-06-29 SURGERY — RADIOLOGY WITH ANESTHESIA
Anesthesia: General | Laterality: Right

## 2021-06-29 MED ORDER — DEXAMETHASONE SODIUM PHOSPHATE 10 MG/ML IJ SOLN
INTRAMUSCULAR | Status: DC | PRN
  Administered 2021-06-29: 10 mg via INTRAVENOUS

## 2021-06-29 MED ORDER — IRBESARTAN 75 MG PO TABS
75.0000 mg | ORAL_TABLET | Freq: Every day | ORAL | Status: DC
  Administered 2021-06-30: 75 mg via ORAL
  Filled 2021-06-29: qty 1

## 2021-06-29 MED ORDER — ONDANSETRON HCL 4 MG/2ML IJ SOLN
INTRAMUSCULAR | Status: DC | PRN
  Administered 2021-06-29: 4 mg via INTRAVENOUS

## 2021-06-29 MED ORDER — CHLORHEXIDINE GLUCONATE 0.12 % MT SOLN: 15.0000 mL | Freq: Once | OROMUCOSAL | Status: AC

## 2021-06-29 MED ORDER — FENTANYL CITRATE PF 50 MCG/ML IJ SOSY
25.0000 ug | PREFILLED_SYRINGE | INTRAMUSCULAR | Status: DC | PRN
  Administered 2021-06-29: 50 ug via INTRAVENOUS

## 2021-06-29 MED ORDER — FENTANYL CITRATE (PF) 250 MCG/5ML IJ SOLN
INTRAMUSCULAR | Status: AC
  Filled 2021-06-29: qty 5

## 2021-06-29 MED ORDER — LACTATED RINGERS IV SOLN
INTRAVENOUS | Status: DC
Start: 2021-06-29 — End: 2021-06-30

## 2021-06-29 MED ORDER — MIDAZOLAM HCL 2 MG/2ML IJ SOLN
INTRAMUSCULAR | Status: AC
  Filled 2021-06-29: qty 2

## 2021-06-29 MED ORDER — SUGAMMADEX SODIUM 200 MG/2ML IV SOLN
INTRAVENOUS | Status: DC | PRN
  Administered 2021-06-29: 200 mg via INTRAVENOUS

## 2021-06-29 MED ORDER — ORAL CARE MOUTH RINSE
15.0000 mL | Freq: Once | OROMUCOSAL | Status: AC
  Administered 2021-06-29: 15 mL via OROMUCOSAL

## 2021-06-29 MED ORDER — FENTANYL CITRATE (PF) 100 MCG/2ML IJ SOLN
INTRAMUSCULAR | Status: DC | PRN
  Administered 2021-06-29: 100 ug via INTRAVENOUS
  Administered 2021-06-29: 50 ug via INTRAVENOUS
  Administered 2021-06-29: 100 ug via INTRAVENOUS

## 2021-06-29 MED ORDER — PROPOFOL 10 MG/ML IV BOLUS
INTRAVENOUS | Status: AC
Start: 2021-06-29 — End: ?
  Filled 2021-06-29: qty 20

## 2021-06-29 MED ORDER — LACTATED RINGERS IV SOLN: INTRAVENOUS | Status: DC

## 2021-06-29 MED ORDER — ALBUTEROL SULFATE (2.5 MG/3ML) 0.083% IN NEBU: 2.5000 mg | INHALATION_SOLUTION | RESPIRATORY_TRACT | Status: DC | PRN

## 2021-06-29 MED ORDER — IOHEXOL 300 MG/ML  SOLN
12.0000 mL | Freq: Once | INTRAMUSCULAR | Status: AC | PRN
  Administered 2021-06-29: 12 mL via INTRATHECAL

## 2021-06-29 MED ORDER — HYDROCODONE-ACETAMINOPHEN 5-325 MG PO TABS
1.0000 | ORAL_TABLET | ORAL | Status: DC | PRN
  Administered 2021-06-30: 1 via ORAL
  Filled 2021-06-29: qty 1

## 2021-06-29 MED ORDER — ALBUTEROL SULFATE (2.5 MG/3ML) 0.083% IN NEBU: 3.0000 mL | INHALATION_SOLUTION | Freq: Four times a day (QID) | RESPIRATORY_TRACT | Status: DC | PRN

## 2021-06-29 MED ORDER — SODIUM CHLORIDE 0.9 % IV SOLN
1.0000 g | Freq: Once | INTRAVENOUS | Status: AC
  Administered 2021-06-29 (×2): 1 g via INTRAVENOUS
  Filled 2021-06-29: qty 10

## 2021-06-29 MED ORDER — MIDAZOLAM HCL 5 MG/5ML IJ SOLN
INTRAMUSCULAR | Status: DC | PRN
  Administered 2021-06-29: 2 mg via INTRAVENOUS

## 2021-06-29 MED ORDER — SODIUM CHLORIDE 0.9 % IV SOLN
INTRAVENOUS | Status: AC
  Filled 2021-06-29: qty 250

## 2021-06-29 MED ORDER — DOCUSATE SODIUM 100 MG PO CAPS
100.0000 mg | ORAL_CAPSULE | Freq: Two times a day (BID) | ORAL | Status: DC
  Administered 2021-06-29 – 2021-06-30 (×2): 100 mg via ORAL
  Filled 2021-06-29 (×2): qty 1

## 2021-06-29 MED ORDER — FENTANYL CITRATE PF 50 MCG/ML IJ SOSY
PREFILLED_SYRINGE | INTRAMUSCULAR | Status: AC
  Administered 2021-06-29: 50 ug via INTRAVENOUS
  Filled 2021-06-29: qty 1

## 2021-06-29 MED ORDER — EPHEDRINE SULFATE-NACL 50-0.9 MG/10ML-% IV SOSY
PREFILLED_SYRINGE | INTRAVENOUS | Status: DC | PRN
  Administered 2021-06-29 (×3): 5 mg via INTRAVENOUS

## 2021-06-29 MED ORDER — HYDROCHLOROTHIAZIDE 12.5 MG PO TABS
12.5000 mg | ORAL_TABLET | Freq: Every day | ORAL | Status: DC
  Administered 2021-06-30: 12.5 mg via ORAL
  Filled 2021-06-29: qty 1

## 2021-06-29 MED ORDER — ACETAMINOPHEN 500 MG PO TABS
1000.0000 mg | ORAL_TABLET | Freq: Once | ORAL | Status: AC
  Administered 2021-06-29: 1000 mg via ORAL

## 2021-06-29 MED ORDER — ALBUTEROL SULFATE HFA 108 (90 BASE) MCG/ACT IN AERS
INHALATION_SPRAY | RESPIRATORY_TRACT | Status: DC | PRN
  Administered 2021-06-29: 2 via RESPIRATORY_TRACT

## 2021-06-29 MED ORDER — DEXMEDETOMIDINE (PRECEDEX) IN NS 20 MCG/5ML (4 MCG/ML) IV SYRINGE
PREFILLED_SYRINGE | INTRAVENOUS | Status: DC | PRN
  Administered 2021-06-29: 8 ug via INTRAVENOUS

## 2021-06-29 MED ORDER — ONDANSETRON HCL 4 MG/2ML IJ SOLN: 4.0000 mg | Freq: Four times a day (QID) | INTRAMUSCULAR | Status: DC | PRN

## 2021-06-29 MED ORDER — BICTEGRAVIR-EMTRICITAB-TENOFOV 50-200-25 MG PO TABS
1.0000 | ORAL_TABLET | Freq: Every day | ORAL | Status: DC
  Filled 2021-06-29: qty 1

## 2021-06-29 MED ORDER — HYDROCODONE-ACETAMINOPHEN 5-325 MG PO TABS
ORAL_TABLET | ORAL | Status: AC
  Administered 2021-06-29: 1 via ORAL
  Filled 2021-06-29: qty 1

## 2021-06-29 MED ORDER — HYDROMORPHONE HCL 1 MG/ML IJ SOLN
1.0000 mg | INTRAMUSCULAR | Status: DC | PRN
  Administered 2021-06-29 – 2021-06-30 (×3): 1 mg via INTRAVENOUS
  Filled 2021-06-29 (×3): qty 1

## 2021-06-29 MED ORDER — PROPOFOL 10 MG/ML IV BOLUS
INTRAVENOUS | Status: DC | PRN
  Administered 2021-06-29: 20 mg via INTRAVENOUS
  Administered 2021-06-29: 40 mg via INTRAVENOUS
  Administered 2021-06-29: 140 mg via INTRAVENOUS

## 2021-06-29 MED ORDER — PROPOFOL 10 MG/ML IV BOLUS
INTRAVENOUS | Status: AC
  Filled 2021-06-29: qty 20

## 2021-06-29 MED ORDER — VALSARTAN-HYDROCHLOROTHIAZIDE 80-12.5 MG PO TABS: 1.0000 | ORAL_TABLET | Freq: Every day | ORAL | Status: DC

## 2021-06-29 MED ORDER — PHENYLEPHRINE HCL-NACL 20-0.9 MG/250ML-% IV SOLN
INTRAVENOUS | Status: DC | PRN
  Administered 2021-06-29: 25 ug/min via INTRAVENOUS

## 2021-06-29 MED ORDER — LIDOCAINE 2% (20 MG/ML) 5 ML SYRINGE
INTRAMUSCULAR | Status: DC | PRN
  Administered 2021-06-29: 40 mg via INTRAVENOUS

## 2021-06-29 MED ORDER — ROCURONIUM BROMIDE 10 MG/ML (PF) SYRINGE
PREFILLED_SYRINGE | INTRAVENOUS | Status: DC | PRN
  Administered 2021-06-29: 20 mg via INTRAVENOUS
  Administered 2021-06-29: 60 mg via INTRAVENOUS
  Administered 2021-06-29 (×2): 10 mg via INTRAVENOUS

## 2021-06-29 MED ORDER — ACETAMINOPHEN 500 MG PO TABS
ORAL_TABLET | ORAL | Status: AC
  Filled 2021-06-29: qty 2

## 2021-06-29 MED ORDER — FENTANYL CITRATE PF 50 MCG/ML IJ SOSY
PREFILLED_SYRINGE | INTRAMUSCULAR | Status: AC
  Administered 2021-06-29: 50 ug via INTRAVENOUS
  Filled 2021-06-29: qty 2

## 2021-06-29 NOTE — Sedation Documentation (Signed)
Anesthesia in to sedate and monitor. 

## 2021-06-29 NOTE — Transfer of Care (Signed)
Immediate Anesthesia Transfer of Care Note ? ?Patient: Kevin [name] ? ?Procedure(s) Performed: RIGHT RENAL CRYOABLATION (Right) ? ?Patient Location: PACU ? ?Anesthesia Type:General ? ?Level of Consciousness: awake and patient cooperative ? ?Airway & Oxygen Therapy: Patient Spontanous Breathing and Patient connected to face mask oxygen ? ?Post-op Assessment: Report given to RN and Post -op Vital signs reviewed and stable ? ?Post vital signs: Reviewed and stable ? ?Last Vitals:  ?Vitals Value Taken Time  ?BP 158/139 06/29/21 1700  ?Temp    ?Pulse 64 06/29/21 1702  ?Resp 10 06/29/21 1702  ?SpO2 96 % 06/29/21 1702  ?Vitals shown include unvalidated device data. ? ?Last Pain:  ?Vitals:  ? 06/29/21 1013  ?PainSc: 0-No pain  ?   ? ?  ? ?Complications: No notable events documented. ?

## 2021-06-29 NOTE — H&P (Signed)
? ? ?Referring Physician(s): ?Winter,C ? ?Supervising Physician: Markus Daft ? ?Patient Status:  [facility] TBA ? ?Chief Complaint: ? ?Right renal lesion ? ?Subjective: ?Patient known to IR service from consultation with Dr.Henn on 05/31/2021 to discuss treatment options for newly noted 1.2 cm right posterior renal lesion concerning for papillary renal cell carcinoma.  Following discussions with Dr.Henn patient was deemed an appropriate candidate for CT-guided cryoablation and possible biopsy of lesion and presents today for the procedure.  Patient's past medical history significant for HIV, CHF, poorly controlled hypertension, gout, chronic kidney disease, COPD, paroxysmal atrial fibrillation, sleep apnea, and peripheral vascular disease.  He is not a good surgical candidate based on his comorbidities and chronic kidney disease.  He currently denies fever, headache, chest pain, worsening dyspnea, cough, abdominal/back pain, nausea, vomiting or bleeding. ? ? ? ?Past Medical History:  ?Diagnosis Date  ? AIDS (acquired immune deficiency syndrome) ([place]) 08/17/2016  ? Chronic diastolic CHF (congestive heart failure), NYHA class 3 ([place]) 01/2016  ? Chronic lower back pain   ? CKD (chronic kidney disease), stage III ([place])   ? COPD (chronic obstructive pulmonary disease) ([place])   ? Gout   ? "forearms, hands, ankles, feet" (06/05/2016)  ? Headache   ? "weekly" (06/05/2016)  ? Heart murmur   ? Hypertension   ? Hypertensive crisis 08/15/2017  ? OSA on CPAP   ? PAD (peripheral artery disease) ([place])   ? PAF (paroxysmal atrial fibrillation) ([place]) 01/2016  ? 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 ?Past Surgical History:  ?Procedure Laterality Date  ? IR RADIOLOGIST EVAL & MGMT  05/31/2021  ? LEFT HEART CATH AND CORONARY ANGIOGRAPHY N/A 10/23/2016  ? Procedure: LEFT HEART CATH AND CORONARY ANGIOGRAPHY;  Surgeon: Leonie Man, MD;  Location: [facility];  Service: Cardiovascular;  Laterality: N/A;  ? LOWER EXTREMITY  ANGIOGRAPHY N/A 07/17/2016  ? Procedure: Lower Extremity Angiography;  Surgeon: Lorretta Harp, MD;  Location: [facility];  Service: Cardiovascular;  Laterality: N/A;  ? LOWER EXTREMITY INTERVENTION N/A 06/05/2016  ? Procedure: Lower Extremity Intervention;  Surgeon: Lorretta Harp, MD;  Location: [facility];  Service: Cardiovascular;  Laterality: N/A;  ? PERIPHERAL VASCULAR ATHERECTOMY Right 07/17/2016  ? Procedure: Peripheral Vascular Atherectomy;  Surgeon: Lorretta Harp, MD;  Location: [facility];  Service: Cardiovascular;  Laterality: Right;  SFA  ? PERIPHERAL VASCULAR INTERVENTION  06/05/2016  ? Procedure: Peripheral Vascular Intervention;  Surgeon: Lorretta Harp, MD;  Location: [facility];  Service: Cardiovascular;;  left SFA  ? ? ? ? ?Allergies: ?Patient has no known allergies. ? ?Medications: ?Prior to Admission medications   ?Medication Sig Start Date End Date Taking? Authorizing Provider  ?albuterol (PROVENTIL) (2.5 MG/3ML) 0.083% nebulizer solution Take 3 mLs (2.5 mg total) by nebulization every 4 (four) hours as needed for wheezing or shortness of breath. 05/13/21  Yes [name] Henle, MD  ?albuterol (VENTOLIN HFA) 108 (90 Base) MCG/ACT inhaler Inhale 2 puffs into the lungs every 6 (six) hours as needed for wheezing or shortness of breath. 04/19/21  Yes Raspet, Erin K, PA-C  ?amLODipine (NORVASC) 10 MG tablet Take 10 mg by mouth every evening.   Yes [provider]  ?bictegravir-emtricitabine-tenofovir AF (BIKTARVY) 50-200-25 MG TABS tablet Take 1 tablet by mouth daily.   Yes [provider]  ?dolutegravir-lamiVUDine (DOVATO) 50-300 MG tablet Take 1 tablet by mouth daily. 06/15/21  Yes [name], [name], MD  ?Multiple  Vitamin (MULTIVITAMIN) tablet Take 1 tablet by mouth daily.   Yes [provider]  ?valsartan-hydrochlorothiazide (DIOVAN-HCT) 80-12.5 MG tablet Take 1 tablet by mouth daily. 06/09/21  Yes [provider]   ?mometasone-formoterol (DULERA) 100-5 MCG/ACT AERO Inhale 2 puffs into the lungs in the morning and at bedtime. 05/13/21   [name] Henle, MD  ? ? ? ?Vital Signs: ?Vitals:  ? 06/29/21 1013  ?BP: (!) 183/97  ? ?Temp 98.1, heart rate 49, respirations 21, O2 sat 100% room air ? ? ?BP (!) 183/97   Ht '6\' 1"'$  (1.854 m)   Wt 160 lb (72.6 kg)   BMI 21.11 kg/m?  ? ?Physical Exam awake, alert.  Chest with distant BS but clear to auscultation bilaterally.  Heart with  bradycardic but reg rhythm;  abdomen soft, positive bowel sounds, nontender.  No lower extremity edema. ? ?Imaging: ?DG Chest 1 View ? ?Result Date: 06/29/2021 ?CLINICAL DATA:  Preop chest x-ray EXAM: CHEST  1 VIEW COMPARISON:  04/19/2021 FINDINGS: The heart size and mediastinal contours are within normal limits. Both lungs are clear. The visualized skeletal structures are unremarkable. IMPRESSION: No active disease. Electronically Signed   By: Franchot Gallo M.D.   On: 06/29/2021 09:57   ? ?Labs: ? ?CBC: ?Recent Labs  ?  12/01/20 ?0249 01/01/21 ?0804 06/15/21 ?0223 06/29/21 ?1005  ?WBC 14.6* 9.9 9.9 13.1*  ?HGB 13.0 12.4* 12.9* 10.7*  ?HCT 37.6* 37.7* 37.5* 32.3*  ?PLT 323 327 322 315  ? ? ?COAGS: ?Recent Labs  ?  06/21/21 ?6045  ?INR 1.1  ? ? ?BMP: ?Recent Labs  ?  12/01/20 ?1316 12/02/20 ?0309 12/03/20 ?0206 01/01/21 ?0804 06/15/21 ?0223  ?NA 135 135 138 137 141  ?K 4.3 4.0 4.2 3.9 4.4  ?CL 105 105 109 107 107  ?CO2 '22 23 23 '$ 21* 28  ?GLUCOSE 149* 122* 81 119* 81  ?BUN 36* 41* 43* 20 39*  ?CALCIUM 9.1 8.7* 8.2* 9.1 9.1  ?CREATININE 2.29* 2.38* 2.25* 1.61* 2.01*  ?GFRNONAA 31* 30* 32* 48*  --   ? ? ?LIVER FUNCTION TESTS: ?Recent Labs  ?  11/30/20 ?0208 06/15/21 ?0223  ?BILITOT 0.6 0.5  ?AST 25 13  ?ALT 15 9  ?ALKPHOS 75  --   ?PROT 7.9 7.5  ?ALBUMIN 3.3*  --   ? ? ?Assessment and Plan: ?Patient known to IR service from consultation with Dr.Henn on 05/31/2021 to discuss treatment options for newly noted 1.2 cm right posterior renal lesion concerning for  papillary renal cell carcinoma.  Following discussions with Dr.Henn patient was deemed an appropriate candidate for CT-guided cryoablation and possible biopsy of lesion and presents today for the procedure.  Patient's past medical history significant for HIV, CHF, poorly controlled hypertension, gout, chronic kidney disease, COPD, paroxysmal atrial fibrillation, sleep apnea, and peripheral vascular disease.  He is not a good surgical candidate based on his comorbidities and chronic kidney disease.  Details/risks of procedure, including but not limited to, internal bleeding, infection, injury to adjacent structures, anesthesia related complications discussed with patient with his understanding and consent. ?Post procedure patient will likely be admitted for overnight observation. ? ?WBC today- 13.1, creat 2.02; CXR ok, UA today shows trace leukocytes with rare bacteria; pt denies any recent infections; above findings discussed with Dr. Anselm Pancoast as well as ID ([name]); pt will receive rocephin preprocedure; ok to proceed with case otherwise per ID ? ?Electronically Signed: ?Autumn Messing, PA-C ?06/29/2021, 10:36 AM ? ? ?I spent a total of  30 minutes at the the patient's bedside AND on the patient's hospital floor or unit, greater than 50% of which was counseling/coordinating care for image guided right renal lesion cryoablation and possible biopsy ? ? ? ? ? ?

## 2021-06-29 NOTE — Procedures (Signed)
Interventional Radiology Procedure: ? ? ?Indications: Suspicious right renal lesion ? ?Procedure: CT and US guided biopsy and cryoablation of right renal lesion ? ?Findings: Slightly exophytic right renal lesion treated with cryoablation.  Approximately 400 ml of dilute contrast was placed around the right kidney for hydro dissection.  Two core biopsies obtained from right renal lesion.  ? ?Complications: No immediate complications noted. ?    ?EBL: Minimal ? ?Plan: Plan for overnight observation.   ? ? ?Melvin Marmo R. Anselm Pancoast, MD  ?Pager: [phone] ? ? ? ?  ?

## 2021-06-29 NOTE — Anesthesia Procedure Notes (Addendum)
Procedure Name: Intubation ?Date/Time: 06/29/2021 1:26 PM ?Performed by: Cynda Familia, CRNA ?Pre-anesthesia Checklist: Patient identified, Emergency Drugs available, Suction available and Patient being monitored ?Patient Re-evaluated:Patient Re-evaluated prior to induction ?Oxygen Delivery Method: Circle System Utilized ?Preoxygenation: Pre-oxygenation with 100% oxygen ?Induction Type: IV induction ?Ventilation: Mask ventilation without difficulty and Oral airway inserted - appropriate to patient size ?Laryngoscope Size: Sabra Heck and 2 ?Grade View: Grade I ?Tube type: Oral ?Tube size: 7.5 mm ?Number of attempts: 2 ?Airway Equipment and Method: Stylet and Oral airway ?Placement Confirmation: ETT inserted through vocal cords under direct vision, positive ETCO2 and breath sounds checked- equal and bilateral ?Secured at: 24 cm ?Tube secured with: Tape ?Dental Injury: Teeth and Oropharynx as per pre-operative assessment  ?Comments: DL x1 with 7.5 ETT. Leak suspected in cuff, tube exchanged for different 7.5 ETT. Placement verified with Videolarygnscope. +etCO2, +bilateral breath sounds. Atraumatic. ETT placement by SRNA assisted and supervised by [facility] ? ? ? ? ?

## 2021-06-29 NOTE — Plan of Care (Signed)

## 2021-06-29 NOTE — Progress Notes (Signed)
MD requested urine for UA be sent.- Sent at 1100 ? ?

## 2021-06-29 NOTE — Anesthesia Postprocedure Evaluation (Signed)
Anesthesia Post Note ? ?Patient: SYLVAN SOOKDEO ? ?Procedure(s) Performed: RIGHT RENAL CRYOABLATION (Right) ? ?  ? ?Patient location during evaluation: PACU ?Anesthesia Type: General ?Level of consciousness: awake and alert, patient cooperative and oriented ?Pain management: pain level controlled ?Vital Signs Assessment: post-procedure vital signs reviewed and stable ?Respiratory status: spontaneous breathing, nonlabored ventilation and respiratory function stable ?Cardiovascular status: blood pressure returned to baseline and stable ?Postop Assessment: no apparent nausea or vomiting ?Anesthetic complications: no ? ? ?No notable events documented. ? ?Last Vitals:  ?Vitals:  ? 06/29/21 1730 06/29/21 1754  ?BP: (!) 181/96 (!) 183/88  ?Pulse: (!) 53 (!) 51  ?Resp: 14 16  ?Temp: 36.6 ?C 36.6 ?C  ?SpO2: 95% 98%  ?  ?Last Pain:  ?Vitals:  ? 06/29/21 1754  ?TempSrc: Oral  ?PainSc:   ? ? ?  ?  ?  ?  ?  ?  ? ?Dlisa Barnwell,E. Mell Mellott ? ? ? ? ?

## 2021-06-30 DIAGNOSIS — C641 Malignant neoplasm of right kidney, except renal pelvis: Secondary | ICD-10-CM | POA: Diagnosis not present

## 2021-06-30 LAB — CBC WITH DIFFERENTIAL/PLATELET
Abs Immature Granulocytes: 0.09 10*3/uL — ABNORMAL HIGH (ref 0.00–0.07)
Basophils Absolute: 0 10*3/uL (ref 0.0–0.1)
Basophils Relative: 0 %
Eosinophils Absolute: 0 10*3/uL (ref 0.0–0.5)
Eosinophils Relative: 0 %
HCT: 29.3 % — ABNORMAL LOW (ref 39.0–52.0)
Hemoglobin: 9.8 g/dL — ABNORMAL LOW (ref 13.0–17.0)
Immature Granulocytes: 1 %
Lymphocytes Relative: 12 %
Lymphs Abs: 2 10*3/uL (ref 0.7–4.0)
MCH: 32.5 pg (ref 26.0–34.0)
MCHC: 33.4 g/dL (ref 30.0–36.0)
MCV: 97 fL (ref 80.0–100.0)
Monocytes Absolute: 0.9 10*3/uL (ref 0.1–1.0)
Monocytes Relative: 6 %
Neutro Abs: 13.2 10*3/uL — ABNORMAL HIGH (ref 1.7–7.7)
Neutrophils Relative %: 81 %
Platelets: 267 10*3/uL (ref 150–400)
RBC: 3.02 MIL/uL — ABNORMAL LOW (ref 4.22–5.81)
RDW: 15.3 % (ref 11.5–15.5)
WBC: 16.2 10*3/uL — ABNORMAL HIGH (ref 4.0–10.5)
nRBC: 0 % (ref 0.0–0.2)

## 2021-06-30 LAB — BASIC METABOLIC PANEL
Anion gap: 4 — ABNORMAL LOW (ref 5–15)
BUN: 32 mg/dL — ABNORMAL HIGH (ref 8–23)
CO2: 25 mmol/L (ref 22–32)
Calcium: 7.8 mg/dL — ABNORMAL LOW (ref 8.9–10.3)
Chloride: 107 mmol/L (ref 98–111)
Creatinine, Ser: 2.2 mg/dL — ABNORMAL HIGH (ref 0.61–1.24)
GFR, Estimated: 33 mL/min — ABNORMAL LOW (ref >60)
Glucose, Bld: 97 mg/dL (ref 70–99)
Potassium: 3.8 mmol/L (ref 3.5–5.1)
Sodium: 136 mmol/L (ref 135–145)

## 2021-06-30 MED ORDER — HYDROCODONE-ACETAMINOPHEN 5-325 MG PO TABS
1.0000 | ORAL_TABLET | Freq: Four times a day (QID) | ORAL | 0 refills | Status: DC | PRN
Start: 2021-06-30 — End: 2021-07-10

## 2021-06-30 NOTE — Discharge Instructions (Signed)
May resume home medications, stay well-hydrated, avoid driving after taking narcotic agents.  If you need to take narcotic agents please do so with food.

## 2021-06-30 NOTE — Discharge Summary (Signed)
Patient ID: DAIMION ADAMCIK MRN: [id number] DOB/AGE: April 07, 1955 66 y.o.  Admit date: 06/29/2021 Discharge date: 06/30/2021  Supervising Physician: Markus Daft  Patient Status: [facility] - In-pt  Admission Diagnoses: Right renal lesion  Discharge Diagnoses: Right renal lesion, status post CT and ultrasound-guided biopsy and cryoablation on 06/29/2021 via general anesthesia  Principal Problem:   Right renal mass Active Problems:   Renal lesion  Past Medical History:  Diagnosis Date   AIDS (acquired immune deficiency syndrome) ([place]) 08/17/2016   Chronic diastolic CHF (congestive heart failure), NYHA class 3 (HCC) 01/2016   Chronic lower back pain    CKD (chronic kidney disease), stage III (HCC)    COPD (chronic obstructive pulmonary disease) (HCC)    Gout    "forearms, hands, ankles, feet" (06/05/2016)   Headache    "weekly" (06/05/2016)   Heart murmur    Hypertension    Hypertensive crisis 08/15/2017   OSA on CPAP    PAD (peripheral artery disease) (HCC)    PAF (paroxysmal atrial fibrillation) ([place]) 01/2016   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   Past Surgical History:  Procedure Laterality Date   IR RADIOLOGIST EVAL & MGMT  05/31/2021   LEFT HEART CATH AND CORONARY ANGIOGRAPHY N/A 10/23/2016   Procedure: LEFT HEART CATH AND CORONARY ANGIOGRAPHY;  Surgeon: Leonie Man, MD;  Location: [place] [facility];  Service: Cardiovascular;  Laterality: N/A;   LOWER EXTREMITY ANGIOGRAPHY N/A 07/17/2016   Procedure: Lower Extremity Angiography;  Surgeon: Lorretta Harp, MD;  Location: [place] [facility];  Service: Cardiovascular;  Laterality: N/A;   LOWER EXTREMITY INTERVENTION N/A 06/05/2016   Procedure: Lower Extremity Intervention;  Surgeon: Lorretta Harp, MD;  Location: [place] [facility];  Service: Cardiovascular;  Laterality: N/A;   PERIPHERAL VASCULAR ATHERECTOMY Right 07/17/2016   Procedure: Peripheral Vascular Atherectomy;  Surgeon: Lorretta Harp, MD;  Location: [place] [facility];  Service: Cardiovascular;  Laterality: Right;  SFA   PERIPHERAL VASCULAR INTERVENTION  06/05/2016   Procedure: Peripheral Vascular Intervention;  Surgeon: Lorretta Harp, MD;  Location: [place] [facility];  Service: Cardiovascular;;  left SFA     Discharged Condition: good  Hospital Course: Mr. Wedel is a 66 year old male known to IR service from consultation with Dr.Henn on 05/31/2021 to discuss treatment options for newly noted 1.2 cm right posterior renal lesion concerning for papillary renal cell carcinoma.  Following discussions with Dr.Henn patient was deemed an appropriate candidate for CT-guided cryoablation and possible biopsy of lesion .  Patient's past medical history significant for HIV, CHF, poorly controlled hypertension, gout, chronic kidney disease, COPD, paroxysmal atrial fibrillation, sleep apnea, and peripheral vascular disease.  He is not a good surgical candidate based on his comorbidities and chronic kidney disease.  On 06/29/2021 he underwent CT and ultrasound-guided biopsy and cryoablation of the right renal lesion under general anesthesia.  Procedure was performed without immediate complications and he was subsequently admitted for overnight observation.  Overnight he did fairly well with some expected procedural site discomfort. On the day of discharge he was stable.  He did note some right flank discomfort and numbness in region which may be secondary to intercostal nerve irritation from close proximity of renal mass to rib.  He did report some mild lightheadedness upon rising from a seated position which improved with some fluid intake.  Vital signs were stable.  He denied fever, headache, respiratory issues, nausea, vomiting or bleeding.  He was able to void yellow  urine, and tolerate his diet without difficulty.  Follow-up WBC was 16.2 up from 13.1 and not unexpected post ablation, hemoglobin 9.8 down slightly from 10.7, creatinine  2.2 up slightly from 2.02.   Patient was seen by Dr. Anselm Pancoast and deemed stable for discharge at this time.  He will resume his usual home medications.  Electronic prescription was sent in to patient's pharmacy for Vicodin, #20, 1 to 2 tablets every 6 hours as needed for moderate pain.  No refills.  We will arrange for follow-up in IR clinic with Dr. Anselm Pancoast in 2 to 3 weeks with renal function recheck.  He was told to contact our service with any additional questions.  Consults: anesthesia  Significant Diagnostic Studies:  Results for orders placed or performed during the hospital encounter of 06/29/21  Urinalysis, Complete w Microscopic  Result Value Ref Range   Color, Urine YELLOW YELLOW   APPearance CLEAR CLEAR   Specific Gravity, Urine 1.012 1.005 - 1.030   pH 5.0 5.0 - 8.0   Glucose, UA NEGATIVE NEGATIVE mg/dL   Hgb urine dipstick NEGATIVE NEGATIVE   Bilirubin Urine NEGATIVE NEGATIVE   Ketones, ur NEGATIVE NEGATIVE mg/dL   Protein, ur NEGATIVE NEGATIVE mg/dL   Nitrite NEGATIVE NEGATIVE   Leukocytes,Ua TRACE (A) NEGATIVE   RBC / HPF 0-5 0 - 5 RBC/hpf   WBC, UA 6-10 0 - 5 WBC/hpf   Bacteria, UA RARE (A) NONE SEEN   Squamous Epithelial / LPF 0-5 0 - 5  Basic metabolic panel  Result Value Ref Range   Sodium 136 135 - 145 mmol/L   Potassium 3.8 3.5 - 5.1 mmol/L   Chloride 107 98 - 111 mmol/L   CO2 25 22 - 32 mmol/L   Glucose, Bld 97 70 - 99 mg/dL   BUN 32 (H) 8 - 23 mg/dL   Creatinine, Ser 2.20 (H) 0.61 - 1.24 mg/dL   Calcium 7.8 (L) 8.9 - 10.3 mg/dL   GFR, Estimated 33 (L) >60 mL/min   Anion gap 4 (L) 5 - 15  CBC with Differential/Platelet  Result Value Ref Range   WBC 16.2 (H) 4.0 - 10.5 K/uL   RBC 3.02 (L) 4.22 - 5.81 MIL/uL   Hemoglobin 9.8 (L) 13.0 - 17.0 g/dL   HCT 29.3 (L) 39.0 - 52.0 %   MCV 97.0 80.0 - 100.0 fL   MCH 32.5 26.0 - 34.0 pg   MCHC 33.4 30.0 - 36.0 g/dL   RDW 15.3 11.5 - 15.5 %   Platelets 267 150 - 400 K/uL   nRBC 0.0 0.0 - 0.2 %   Neutrophils Relative % 81 %   Neutro Abs 13.2 (H)  1.7 - 7.7 K/uL   Lymphocytes Relative 12 %   Lymphs Abs 2.0 0.7 - 4.0 K/uL   Monocytes Relative 6 %   Monocytes Absolute 0.9 0.1 - 1.0 K/uL   Eosinophils Relative 0 %   Eosinophils Absolute 0.0 0.0 - 0.5 K/uL   Basophils Relative 0 %   Basophils Absolute 0.0 0.0 - 0.1 K/uL   Immature Granulocytes 1 %   Abs Immature Granulocytes 0.09 (H) 0.00 - 0.07 K/uL  ABO/Rh  Result Value Ref Range   ABO/RH(D)      O POS Performed at [facility], [place] [address], [place],  [address]      Treatments: CT and ultrasound-guided right renal lesion cryoablation and biopsy on 06/29/2021 with general anesthesia  Discharge Exam: Blood pressure (!) 175/86, pulse (!) 56, temperature  97.9 F (36.6 C), temperature source Oral, resp. rate 17, height '6\' 1"'$  (1.854 m), weight 160 lb (72.6 kg), SpO2 97 %. Awake, alert.  Chest with distant sounds bilat;  no audible wheezing.  Heart with bradycardic but regular rhythm.  Abdomen soft, positive bowel sounds, puncture site right flank clean, dry, mild-mod tender to palpation ,no obvious hematoma.  No lower extremity edema.  Disposition: Discharge disposition: 01-Home or Self Care       Discharge Instructions     Call MD for:  difficulty breathing, headache or visual disturbances   Complete by: As directed    Call MD for:  extreme fatigue   Complete by: As directed    Call MD for:  hives   Complete by: As directed    Call MD for:  persistant dizziness or light-headedness   Complete by: As directed    Call MD for:  persistant nausea and vomiting   Complete by: As directed    Call MD for:  redness, tenderness, or signs of infection (pain, swelling, redness, odor or green/yellow discharge around incision site)   Complete by: As directed    Call MD for:  severe uncontrolled pain   Complete by: As directed    Call MD for:  temperature >100.4   Complete by: As directed    Change dressing (specify)   Complete by: As directed    May  apply new Band-Aids to puncture sites right flank for the next 2 to 3 days.  May wash sites with soap and water.   Diet - low sodium heart healthy   Complete by: As directed    Discharge instructions   Complete by: As directed    Stay well-hydrated, resume home medications, avoid driving after taking narcotic agents   Driving Restrictions   Complete by: As directed    No driving for the next 24 hours or after taking narcotic agents   Increase activity slowly   Complete by: As directed    Lifting restrictions   Complete by: As directed    No heavy lifting for the next 3 to 4 days   May shower / Bathe   Complete by: As directed    May walk up steps   Complete by: As directed       Allergies as of 06/30/2021   No Known Allergies      Medication List     STOP taking these medications    amLODipine 10 MG tablet Commonly known as: NORVASC   Dulera 100-5 MCG/ACT Aero Generic drug: mometasone-formoterol       TAKE these medications    albuterol 108 (90 Base) MCG/ACT inhaler Commonly known as: VENTOLIN HFA Inhale 2 puffs into the lungs every 6 (six) hours as needed for wheezing or shortness of breath.   albuterol (2.5 MG/3ML) 0.083% nebulizer solution Commonly known as: PROVENTIL Take 3 mLs (2.5 mg total) by nebulization every 4 (four) hours as needed for wheezing or shortness of breath.   Biktarvy 50-200-25 MG Tabs tablet Generic drug: bictegravir-emtricitabine-tenofovir AF Take 1 tablet by mouth daily.   Dovato 50-300 MG tablet Generic drug: dolutegravir-lamiVUDine Take 1 tablet by mouth daily.   HYDROcodone-acetaminophen 5-325 MG tablet Commonly known as: NORCO/VICODIN Take 1-2 tablets by mouth every 6 (six) hours as needed for moderate pain.   multivitamin tablet Take 1 tablet by mouth daily.   valsartan-hydrochlorothiazide 80-12.5 MG tablet Commonly known as: DIOVAN-HCT Take 1 tablet by mouth daily.  Discharge Care Instructions   (From admission, onward)           Start     Ordered   06/30/21 0000  Change dressing (specify)       Comments: May apply new Band-Aids to puncture sites right flank for the next 2 to 3 days.  May wash sites with soap and water.   06/30/21 1259            Follow-up Information     Markus Daft, MD Follow up.   Specialties: Interventional Radiology, Radiology Why: Radiology service will call you with follow-up appointment with Dr. Anselm Pancoast in 2 to 3 weeks; call [phone] or [phone] with any questions Contact information: [address] [place] [address] [phone]         Ceasar Mons, MD Follow up.   Specialty: Urology Why: Follow-up with Dr. Lovena Neighbours as scheduled Contact information: [address] [place] [address] [phone]                  Electronically Signed: D. Rowe Robert, PA-C 06/30/2021, 1:02 PM   I have spent Less Than 30 Minutes discharging [name].

## 2021-07-01 ENCOUNTER — Encounter (HOSPITAL_COMMUNITY): Payer: Self-pay | Admitting: Diagnostic Radiology

## 2021-07-02 LAB — SURGICAL PATHOLOGY

## 2021-07-04 ENCOUNTER — Other Ambulatory Visit: Payer: Self-pay | Admitting: Diagnostic Radiology

## 2021-07-04 DIAGNOSIS — N2889 Other specified disorders of kidney and ureter: Secondary | ICD-10-CM

## 2021-07-06 ENCOUNTER — Other Ambulatory Visit (HOSPITAL_COMMUNITY): Payer: Self-pay

## 2021-07-07 ENCOUNTER — Other Ambulatory Visit (HOSPITAL_COMMUNITY): Payer: Self-pay

## 2021-07-09 ENCOUNTER — Emergency Department (HOSPITAL_COMMUNITY): Payer: Medicaid Other

## 2021-07-09 ENCOUNTER — Encounter (HOSPITAL_COMMUNITY): Payer: Self-pay | Admitting: *Deleted

## 2021-07-09 ENCOUNTER — Encounter (HOSPITAL_COMMUNITY): Payer: Self-pay | Admitting: Emergency Medicine

## 2021-07-09 ENCOUNTER — Other Ambulatory Visit: Payer: Self-pay

## 2021-07-09 ENCOUNTER — Ambulatory Visit (HOSPITAL_COMMUNITY): Admission: EM | Admit: 2021-07-09 | Discharge: 2021-07-09 | Payer: Medicaid Other

## 2021-07-09 ENCOUNTER — Emergency Department (HOSPITAL_COMMUNITY)
Admission: EM | Admit: 2021-07-09 | Discharge: 2021-07-09 | Disposition: A | Discharge status: Home / Self Care | Payer: Medicaid Other | Attending: Emergency Medicine | Admitting: Emergency Medicine

## 2021-07-09 DIAGNOSIS — I10 Essential (primary) hypertension: Secondary | ICD-10-CM

## 2021-07-09 DIAGNOSIS — R1031 Right lower quadrant pain: Secondary | ICD-10-CM

## 2021-07-09 DIAGNOSIS — Z79899 Other long term (current) drug therapy: Secondary | ICD-10-CM | POA: Insufficient documentation

## 2021-07-09 DIAGNOSIS — R10813 Right lower quadrant abdominal tenderness: Secondary | ICD-10-CM

## 2021-07-09 DIAGNOSIS — N200 Calculus of kidney: Secondary | ICD-10-CM | POA: Insufficient documentation

## 2021-07-09 DIAGNOSIS — I509 Heart failure, unspecified: Secondary | ICD-10-CM | POA: Diagnosis not present

## 2021-07-09 DIAGNOSIS — R109 Unspecified abdominal pain: Secondary | ICD-10-CM | POA: Diagnosis present

## 2021-07-09 DIAGNOSIS — Z85528 Personal history of other malignant neoplasm of kidney: Secondary | ICD-10-CM | POA: Insufficient documentation

## 2021-07-09 DIAGNOSIS — N183 Chronic kidney disease, stage 3 unspecified: Secondary | ICD-10-CM | POA: Insufficient documentation

## 2021-07-09 DIAGNOSIS — Z21 Asymptomatic human immunodeficiency virus [HIV] infection status: Secondary | ICD-10-CM | POA: Insufficient documentation

## 2021-07-09 DIAGNOSIS — J449 Chronic obstructive pulmonary disease, unspecified: Secondary | ICD-10-CM | POA: Insufficient documentation

## 2021-07-09 DIAGNOSIS — I130 Hypertensive heart and chronic kidney disease with heart failure and stage 1 through stage 4 chronic kidney disease, or unspecified chronic kidney disease: Secondary | ICD-10-CM | POA: Insufficient documentation

## 2021-07-09 DIAGNOSIS — R1032 Left lower quadrant pain: Secondary | ICD-10-CM | POA: Diagnosis not present

## 2021-07-09 LAB — CBC WITH DIFFERENTIAL/PLATELET
Abs Immature Granulocytes: 0.06 10*3/uL (ref 0.00–0.07)
Basophils Absolute: 0 10*3/uL (ref 0.0–0.1)
Basophils Relative: 0 %
Eosinophils Absolute: 0.3 10*3/uL (ref 0.0–0.5)
Eosinophils Relative: 3 %
HCT: 28.9 % — ABNORMAL LOW (ref 39.0–52.0)
Hemoglobin: 9.6 g/dL — ABNORMAL LOW (ref 13.0–17.0)
Immature Granulocytes: 1 %
Lymphocytes Relative: 25 %
Lymphs Abs: 2.2 10*3/uL (ref 0.7–4.0)
MCH: 31.9 pg (ref 26.0–34.0)
MCHC: 33.2 g/dL (ref 30.0–36.0)
MCV: 96 fL (ref 80.0–100.0)
Monocytes Absolute: 0.6 10*3/uL (ref 0.1–1.0)
Monocytes Relative: 7 %
Neutro Abs: 5.7 10*3/uL (ref 1.7–7.7)
Neutrophils Relative %: 64 %
Platelets: 317 10*3/uL (ref 150–400)
RBC: 3.01 MIL/uL — ABNORMAL LOW (ref 4.22–5.81)
RDW: 15.5 % (ref 11.5–15.5)
WBC: 8.9 10*3/uL (ref 4.0–10.5)
nRBC: 0 % (ref 0.0–0.2)

## 2021-07-09 LAB — URINALYSIS, ROUTINE W REFLEX MICROSCOPIC
Bacteria, UA: NONE SEEN
Bilirubin Urine: NEGATIVE
Glucose, UA: NEGATIVE mg/dL
Ketones, ur: NEGATIVE mg/dL
Leukocytes,Ua: NEGATIVE
Nitrite: NEGATIVE
Protein, ur: NEGATIVE mg/dL
Specific Gravity, Urine: 1.012 (ref 1.005–1.030)
pH: 5 (ref 5.0–8.0)

## 2021-07-09 LAB — COMPREHENSIVE METABOLIC PANEL
ALT: 16 U/L (ref 0–44)
AST: 15 U/L (ref 15–41)
Albumin: 2.9 g/dL — ABNORMAL LOW (ref 3.5–5.0)
Alkaline Phosphatase: 50 U/L (ref 38–126)
Anion gap: 6 (ref 5–15)
BUN: 31 mg/dL — ABNORMAL HIGH (ref 8–23)
CO2: 24 mmol/L (ref 22–32)
Calcium: 8.6 mg/dL — ABNORMAL LOW (ref 8.9–10.3)
Chloride: 110 mmol/L (ref 98–111)
Creatinine, Ser: 2.64 mg/dL — ABNORMAL HIGH (ref 0.61–1.24)
GFR, Estimated: 26 mL/min — ABNORMAL LOW (ref >60)
Glucose, Bld: 86 mg/dL (ref 70–99)
Potassium: 4.2 mmol/L (ref 3.5–5.1)
Sodium: 140 mmol/L (ref 135–145)
Total Bilirubin: 0.5 mg/dL (ref 0.3–1.2)
Total Protein: 6.6 g/dL (ref 6.5–8.1)

## 2021-07-09 LAB — CK: Total CK: 76 U/L (ref 49–397)

## 2021-07-09 LAB — LIPASE, BLOOD: Lipase: 60 U/L — ABNORMAL HIGH (ref 11–51)

## 2021-07-09 MED ORDER — HYDROMORPHONE HCL 1 MG/ML IJ SOLN
0.5000 mg | Freq: Once | INTRAMUSCULAR | Status: AC
  Administered 2021-07-09: 0.5 mg via INTRAVENOUS
  Filled 2021-07-09: qty 1

## 2021-07-09 MED ORDER — OXYCODONE-ACETAMINOPHEN 5-325 MG PO TABS: 1.0000 | ORAL_TABLET | Freq: Four times a day (QID) | ORAL | 0 refills | Status: DC | PRN

## 2021-07-09 MED ORDER — MORPHINE SULFATE (PF) 4 MG/ML IV SOLN
4.0000 mg | Freq: Once | INTRAVENOUS | Status: AC
  Administered 2021-07-09: 4 mg via INTRAVENOUS
  Filled 2021-07-09: qty 1

## 2021-07-09 MED ORDER — LACTATED RINGERS IV BOLUS
1000.0000 mL | Freq: Once | INTRAVENOUS | Status: AC
  Administered 2021-07-09: 1000 mL via INTRAVENOUS

## 2021-07-09 NOTE — Discharge Instructions (Addendum)
We did not find any emergent cause to your symptoms today on labs or imaging.  I have prescribed you Percocet for you pain. Please take as prescribed. Please call the doctor who did your procedure on Tuesday regarding your continued pain. Try to get an earlier scheduled appointment. If you develop worsening pain, inability to urinate, blood in urine, or fevers please return to the Emergency department.

## 2021-07-09 NOTE — ED Triage Notes (Signed)
Pt reports SHOB with activity such as drinking a cup of water . Pt is having on going Rt flank pain with renal surgery 10 days ago for cancer. PT BP 204/186 in triage. Pt reports pain 9/10.

## 2021-07-09 NOTE — ED Provider Notes (Signed)
[place]    CSN: [id number] Arrival date & time: 07/09/21  1018      History   Chief Complaint Chief Complaint  Patient presents with   Pain Lt side   COPD   Pain control    HPI Kevin [name] is a 66 y.o. male.   Patient presents urgent care for evaluation of his right-sided flank pain that started a couple of days ago after he ran out of his Vicodin pain medications from her recent kidney procedure.  Right renal cryoablation was performed in interventional radiology on Jun 30, 2021 due to renal mass.  Patient tolerated procedure well per procedure note and was discharged with Vicodin for pain relief.  Patient ran out of medication 2 days ago and began to have significant right flank pain last night that worsened throughout the night and into this morning.  Pain is currently a 9 on a scale of 0-10 and feels like someone is "punching his right kidney".  He is unable to get in a comfortable position in the exam room.  He has chronic kidney disease stage III and states that he does still make urine.  Denies urinary urgency, hesitancy, and dysuria, but does report urinary frequency.  Also reports intermittent chills at home with no documented fever.  States he is experiencing right and left lower quadrant abdominal pain at this time, but does state that he has not had regular bowel movements since starting Vicodin pain medication after procedure.  Blood pressure is significantly elevated in clinic today.  Patient has history of CHF, CKD, hypertension, and PAD.  He takes valsartan hydrochlorothiazide for blood pressure management.  He has not taken this medication today since he has not eaten anything this morning.  Last dose of blood pressure medicine was yesterday morning.  Denies any other aggravating or relieving factors for symptoms at this time.   COPD   Past Medical History:  Diagnosis Date   AIDS (acquired immune deficiency syndrome) ([place]) 08/17/2016   Chronic diastolic  CHF (congestive heart failure), NYHA class 3 (HCC) 01/2016   Chronic lower back pain    CKD (chronic kidney disease), stage III (HCC)    COPD (chronic obstructive pulmonary disease) ([place])    Gout    "forearms, hands, ankles, feet" (06/05/2016)   Headache    "weekly" (06/05/2016)   Heart murmur    Hypertension    Hypertensive crisis 08/15/2017   OSA on CPAP    PAD (peripheral artery disease) (HCC)    PAF (paroxysmal atrial fibrillation) ([place]) 01/2016   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    Patient Active Problem List   Diagnosis Date Noted   Right renal mass 06/29/2021   Renal lesion 06/29/2021   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   Hypertensive crisis    Acute respiratory failure with hypoxia ([place])    Dyspnea 04/04/2019   Callus of foot 07/10/2018   Hypertensive urgency 08/15/2017   Coronary artery disease involving native coronary artery of native heart with unstable angina pectoris ([place]) 10/19/2016   Easy bruising 08/11/2016   Claudication in peripheral vascular disease ([place]) 06/05/2016   Stage 3 chronic kidney disease ([place])    Peripheral arterial disease ([place]) 05/09/2016   Acute on chronic diastolic CHF (congestive heart failure), NYHA class 3 ([place]) 03/24/2016   OSA (obstructive sleep apnea) 03/24/2016   Essential hypertension 02/18/2016   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   Shortness of breath 02/07/2016   PAF (paroxysmal atrial fibrillation) ([place])  02/07/2016   Tobacco abuse 02/07/2016   Acute on chronic renal insufficiency 02/07/2016   COPD exacerbation ([place]) 02/07/2016   Hypertensive emergency 02/07/2016   HIV disease ([place]) 08/27/2004    Past Surgical History:  Procedure Laterality Date   IR RADIOLOGIST EVAL & MGMT  05/31/2021   LEFT HEART CATH AND CORONARY ANGIOGRAPHY N/A 10/23/2016   Procedure: LEFT HEART CATH AND CORONARY ANGIOGRAPHY;  Surgeon: Leonie Man, MD;  Location: [place] [facility];  Service: Cardiovascular;   Laterality: N/A;   LOWER EXTREMITY ANGIOGRAPHY N/A 07/17/2016   Procedure: Lower Extremity Angiography;  Surgeon: Lorretta Harp, MD;  Location: [place] [facility];  Service: Cardiovascular;  Laterality: N/A;   LOWER EXTREMITY INTERVENTION N/A 06/05/2016   Procedure: Lower Extremity Intervention;  Surgeon: Lorretta Harp, MD;  Location: [place] [facility];  Service: Cardiovascular;  Laterality: N/A;   PERIPHERAL VASCULAR ATHERECTOMY Right 07/17/2016   Procedure: Peripheral Vascular Atherectomy;  Surgeon: Lorretta Harp, MD;  Location: [place] [facility];  Service: Cardiovascular;  Laterality: Right;  SFA   PERIPHERAL VASCULAR INTERVENTION  06/05/2016   Procedure: Peripheral Vascular Intervention;  Surgeon: Lorretta Harp, MD;  Location: [place] [facility];  Service: Cardiovascular;;  left SFA   RADIOLOGY WITH ANESTHESIA Right 06/29/2021   Procedure: RIGHT RENAL CRYOABLATION;  Surgeon: Markus Daft, MD;  Location: [facility];  Service: Anesthesiology;  Laterality: Right;       Home Medications    Prior to Admission medications   Medication Sig Start Date End Date Taking? Authorizing Provider  albuterol (PROVENTIL) (2.5 MG/3ML) 0.083% nebulizer solution Take 3 mLs (2.5 mg total) by nebulization every 4 (four) hours as needed for wheezing or shortness of breath. 05/13/21   [name] Henle, MD  albuterol (VENTOLIN HFA) 108 (90 Base) MCG/ACT inhaler Inhale 2 puffs into the lungs every 6 (six) hours as needed for wheezing or shortness of breath. 04/19/21   Raspet, Derry Skill, PA-C  bictegravir-emtricitabine-tenofovir AF (BIKTARVY) 50-200-25 MG TABS tablet Take 1 tablet by mouth daily.    [provider]  dolutegravir-lamiVUDine (DOVATO) 50-300 MG tablet Take 1 tablet by mouth daily. 06/15/21   Truman Hayward, MD  HYDROcodone-acetaminophen (NORCO/VICODIN) 5-325 MG tablet Take 1-2 tablets by mouth every 6 (six) hours as needed for moderate pain. 06/30/21   Allred, Darrell K, PA-C  Multiple  Vitamin (MULTIVITAMIN) tablet Take 1 tablet by mouth daily.    [provider]  valsartan-hydrochlorothiazide (DIOVAN-HCT) 80-12.5 MG tablet Take 1 tablet by mouth daily. 06/09/21   [provider]    Family History Family History  Problem Relation Age of Onset   High blood pressure Mother    Lupus Mother     Social History Social History   Tobacco Use   Smoking status: Every Day    Packs/day: 0.10    Years: 42.00    Pack years: 4.20    Types: Cigarettes, E-cigarettes   Smokeless tobacco: Never   Tobacco comments:    1-2 cigarettes a day; vaping  Vaping Use   Vaping Use: Every day   Substances: Nicotine, Flavoring  Substance Use Topics   Alcohol use: Not Currently    Alcohol/week: 2.0 standard drinks    Types: 2 Cans of beer per week    Comment: rare   Drug use: Not Currently    Comment: rare     Allergies   Patient has no known allergies.   Review of Systems Review of Systems Per HPI  Physical Exam Triage Vital Signs ED Triage Vitals  Enc Vitals Group     BP 07/09/21 1043 (!) 204/186     Pulse Rate 07/09/21 1043 60     Resp 07/09/21 1043 20     Temp 07/09/21 1043 97.8 F (36.6 C)     Temp src --      SpO2 07/09/21 1043 98 %     Weight --      Height --      Head Circumference --      Peak Flow --      Pain Score 07/09/21 1042 9     Pain Loc --      Pain Edu? --      Excl. in [place]? --    No data found.  Updated Vital Signs BP (S) (!) 174/94 Comment: BP recheck  Pulse 60   Temp 97.8 F (36.6 C)   Resp 20   SpO2 98%   Visual Acuity Right Eye Distance:   Left Eye Distance:   Bilateral Distance:    Right Eye Near:   Left Eye Near:    Bilateral Near:     Physical Exam Vitals and nursing note reviewed.  Constitutional:      General: He is not in acute distress.    Appearance: Normal appearance. He is well-developed. He is ill-appearing. He is not diaphoretic.     Comments: Very pleasant patient sitting comfortably on  exam able in no acute distress.   HENT:     Head: Normocephalic and atraumatic.     Right Ear: External ear normal.     Left Ear: External ear normal.     Nose: Nose normal.     Mouth/Throat:     Mouth: Mucous membranes are moist.  Eyes:     General: Lids are normal. Vision grossly intact. Gaze aligned appropriately.     Extraocular Movements: Extraocular movements intact.     Conjunctiva/sclera: Conjunctivae normal.     Right eye: Right conjunctiva is not injected.     Left eye: Left conjunctiva is not injected.  Cardiovascular:     Rate and Rhythm: Normal rate and regular rhythm.     Heart sounds: Normal heart sounds, S1 normal and S2 normal. No murmur heard.   No friction rub. No gallop.  Pulmonary:     Effort: Pulmonary effort is normal. No respiratory distress.     Breath sounds: Normal breath sounds. No decreased air movement. No wheezing, rhonchi or rales.  Chest:     Chest wall: No tenderness.  Abdominal:     General: Abdomen is flat. There is no distension.     Palpations: Abdomen is soft. There is no mass.     Tenderness: There is abdominal tenderness in the right lower quadrant and left lower quadrant. There is right CVA tenderness. There is no left CVA tenderness.  Musculoskeletal:        General: No swelling.     Cervical back: Neck supple.     Right lower leg: No edema.     Left lower leg: No edema.  Lymphadenopathy:     Cervical: No cervical adenopathy.  Skin:    General: Skin is warm and dry.     Capillary Refill: Capillary refill takes less than 2 seconds.     Findings: No rash.  Neurological:     General: No focal deficit present.     Mental Status: He is alert and oriented to person, place, and time.  Mental status is at baseline.     Gait: Gait is intact. Gait normal.  Psychiatric:        Attention and Perception: Attention and perception normal.        Mood and Affect: Mood normal.        Speech: Speech normal.        Behavior: Behavior normal.  Behavior is cooperative.        Thought Content: Thought content normal.        Cognition and Memory: Cognition and memory normal.        Judgment: Judgment normal.     UC Treatments / Results  Labs (all labs ordered are listed, but only abnormal results are displayed) Labs Reviewed - No data to display  EKG   Radiology No results found.  Procedures Procedures (including critical care time)  Medications Ordered in UC Medications - No data to display  Initial Impression / Assessment and Plan / UC Course  I have reviewed the triage vital signs and the nursing notes.  Pertinent labs & imaging results that were available during my care of the patient were reviewed by me and considered in my medical decision making (see chart for details).  Patient is a 66 year old male presenting to urgent care today with severe right-sided flank pain status postop right renal cryoablation 10 days ago.  I saw this patient in triage.  Blood pressure is elevated in clinic this morning with initial reading at 204/186.  Blood pressure still elevated on recheck at 174/94.  Due to patient's subjective pain and physical exam findings, recommend patient go to the emergency department for further evaluation of possible postoperative complication and better pain management than we can provide at urgent care due to limited resources.    Patient verbalizes agreement and understanding of plan.  Also understands risk of not reporting immediately to the emergency department for evaluation.  Daughter to transport patient to the emergency department by personal vehicle.  Patient stable for transport at this time.  Final Clinical Impressions(s) / UC Diagnoses   Final diagnoses:  Acute right flank pain     Discharge Instructions      I recommend you report to the nearest emergency department for further evaluation of possible postoperative complication related to your right kidney.     ED Prescriptions    None    PDMP not reviewed this encounter.   Talbot Grumbling, [place] 07/09/21 1214

## 2021-07-09 NOTE — ED Provider Notes (Signed)
[place] EMER[facility]ENCY DEPARTMENT Provider Note   CSN: [id number] Arrival date & time: 07/09/21  1125     History PMH: HIV/AIDS, CKD stage 3, Tobacco use, COPD, HTN, CHF, OSA, PAD, papillary renal cell carcinoma Chief Complaint  Patient presents with   Flank Pain    Kevin [name] is a 66 y.o. male.  Presents the ED with chief plaint of right flank pain.  He says that he was diagnosed with papillary renal cell carcinoma recently.  On May 17, he underwent a right renal biopsy with cryoablation for the mass.  He was discharged with Vicodin's.  He has completed this course, however over the past 3 days he has had worsening right flank pain.  He states that the pain is worse than it was when he initially had the biopsy done.  He rates it 10 out of 10.  Pain is constant. Pain Wraps around to his right lower quadrant and suprapubic region.  Interventional radiology told him that he should not have any pain for longer than 7 days and it is 10 days status post biopsy.  Patient's daughter says she has noted some increased swelling in his right side extending into his right lower abdomen. He denies any dysuria, hematuria, or difficulty urinating.  Denies fevers or chills.  He has no nausea or vomiting.  Have history of HIV.  Most recent CD4 count was 3 weeks ago and was 918.  Most recent HIV RNA was undetectable.  He has been controlled on Dovato for this. Denies history or kidney stones or pyelo infections. He is not on blood thinners.    Flank Pain Associated symptoms include abdominal pain.      Home Medications Prior to Admission medications   Medication Sig Start Date End Date Taking? Authorizing Provider  oxyCODONE-acetaminophen (PERCOCET/ROXICET) 5-325 MG tablet Take 1 tablet by mouth every 6 (six) hours as needed for up to 5 days for severe pain. 07/09/21 07/14/21 Yes Pas[place] Marchitto, Adora Fridge, PA-C  albuterol (PROVENTIL) (2.5 MG/3ML) 0.083% nebulizer solution Take 3 mLs (2.5 mg  total) by nebulization every 4 (four) hours as needed for wheezing or shortness of breath. 05/13/21   [name] Henle, MD  albuterol (VENTOLIN HFA) 108 (90 Base) MCG/ACT inhaler Inhale 2 puffs into the lungs every 6 (six) hours as needed for wheezing or shortness of breath. 04/19/21   Raspet, Derry Skill, PA-C  bictegravir-emtricitabine-tenofovir AF (BIKTARVY) 50-200-25 MG TABS tablet Take 1 tablet by mouth daily.    [provider]  dolutegravir-lamiVUDine (DOVATO) 50-300 MG tablet Take 1 tablet by mouth daily. 06/15/21   Truman Hayward, MD  HYDROcodone-acetaminophen (NORCO/VICODIN) 5-325 MG tablet Take 1-2 tablets by mouth every 6 (six) hours as needed for moderate pain. 06/30/21   Allred, Darrell K, PA-C  Multiple Vitamin (MULTIVITAMIN) tablet Take 1 tablet by mouth daily.    [provider]  valsartan-hydrochlorothiazide (DIOVAN-HCT) 80-12.5 MG tablet Take 1 tablet by mouth daily. 06/09/21   [provider]      Allergies    Patient has no known allergies.    Review of Systems   Review of Systems  Gastrointestinal:  Positive for abdominal pain.  Genitourinary:  Positive for flank pain.  All other systems reviewed and are negative.  Physical Exam Updated Vital Signs BP (!) 168/94   Pulse 63   Temp 98.4 F (36.9 C) (Oral)   Resp 17   SpO2 100%  Physical Exam Vitals and nursing note reviewed.  Constitutional:  General: He is not in acute distress.    Appearance: Normal appearance. He is not ill-appearing, toxic-appearing or diaphoretic.  HENT:     Head: Normocephalic and atraumatic.     Nose: No nasal deformity.     Mouth/Throat:     Lips: Pink. No lesions.     Mouth: No injury, lacerations, oral lesions or angioedema.     Pharynx: Uvula midline. No pharyngeal swelling or uvula swelling.  Eyes:     General: Gaze aligned appropriately. No scleral icterus.       Right eye: No discharge.        Left eye: No discharge.     Conjunctiva/sclera:  Conjunctivae normal.     Right eye: Right conjunctiva is not injected. No exudate or hemorrhage.    Left eye: Left conjunctiva is not injected. No exudate or hemorrhage. Cardiovascular:     Rate and Rhythm: Normal rate and regular rhythm.     Pulses: Normal pulses.          Radial pulses are 2+ on the right side and 2+ on the left side.       Dorsalis pedis pulses are 2+ on the right side and 2+ on the left side.     Heart sounds: Normal heart sounds, S1 normal and S2 normal. Heart sounds not distant. No murmur heard.   No friction rub. No gallop. No S3 or S4 sounds.  Pulmonary:     Effort: Pulmonary effort is normal. No accessory muscle usage or respiratory distress.     Breath sounds: Normal breath sounds. No stridor. No wheezing, rhonchi or rales.  Chest:     Chest wall: No tenderness.  Abdominal:     General: Abdomen is flat. There is no distension.     Palpations: Abdomen is soft. There is no mass or pulsatile mass.     Tenderness: There is abdominal tenderness. There is right CVA tenderness. There is no guarding or rebound.     Comments: RLQ, suprapubic tenderness. No rebound or guarding  I cannot appreciate any hematoma or swelling or right flank or lower abdomen  Musculoskeletal:     Right lower leg: No edema.     Left lower leg: No edema.  Skin:    General: Skin is warm and dry.     Coloration: Skin is not jaundiced or pale.     Findings: No bruising, erythema, lesion or rash.  Neurological:     General: No focal deficit present.     Mental Status: He is alert and oriented to person, place, and time.     GCS: GCS eye subscore is 4. GCS verbal subscore is 5. GCS motor subscore is 6.  Psychiatric:        Mood and Affect: Mood normal.        Behavior: Behavior normal. Behavior is cooperative.    ED Results / Procedures / Treatments   Labs (all labs ordered are listed, but only abnormal results are displayed) Labs Reviewed  URINALYSIS, ROUTINE W REFLEX MICROSCOPIC -  Abnormal; Notable for the following components:      Result Value   Hgb urine dipstick LARGE (*)    Non Squamous Epithelial 0-5 (*)    All other components within normal limits  COMPREHENSIVE METABOLIC PANEL - Abnormal; Notable for the following components:   BUN 31 (*)    Creatinine, Ser 2.64 (*)    Calcium 8.6 (*)    Albumin 2.9 (*)    GFR, Estimated  26 (*)    All other components within normal limits  LIPASE, BLOOD - Abnormal; Notable for the following components:   Lipase 60 (*)    All other components within normal limits  CBC WITH DIFFERENTIAL/PLATELET - Abnormal; Notable for the following components:   RBC 3.01 (*)    Hemoglobin 9.6 (*)    HCT 28.9 (*)    All other components within normal limits  URINE CULTURE    EKG None  Radiology CT RENAL STONE STUDY  Result Date: 07/09/2021 CLINICAL DATA:  Flank pain, kidney stone suspected. EXAM: CT ABDOMEN AND PELVIS WITHOUT CONTRAST TECHNIQUE: Multidetector CT imaging of the abdomen and pelvis was performed following the standard protocol without IV contrast. RADIATION DOSE REDUCTION: This exam was performed according to the departmental dose-optimization program which includes automated exposure control, adjustment of the mA and/or kV according to patient size and/or use of iterative reconstruction technique. COMPARISON:  CT examination dated April 15, 2021 FINDINGS: Lower chest: No acute abnormality. Hepatobiliary: No focal liver abnormality is seen. No gallstones, gallbladder wall thickening, or biliary dilatation. Pancreas: Unremarkable. No pancreatic ductal dilatation or surrounding inflammatory changes. Spleen: Normal in size without focal abnormality. Adrenals/Urinary Tract: Adrenal glands are unremarkable. Punctate nonobstructing calculus in the left renal pelvis. No evidence of hydronephrosis or ureteral calculus. Hypodense complex cystic structure in the interpolar region of the right kidney, not well visualized on this  noncontrast examination, likely proteinaceous cyst. Bladder is unremarkable. Stomach/Bowel: Stomach is within normal limits. Appendix appears normal. No evidence of bowel wall thickening, distention, or inflammatory changes. Vascular/Lymphatic: Aortic atherosclerosis. No enlarged abdominal or pelvic lymph nodes. Reproductive: Prostate is unremarkable. Other: No abdominal wall hernia or abnormality. No abdominopelvic ascites. Musculoskeletal: Mild degenerative disease of the lumbar spine. No acute osseous abnormality. IMPRESSION: 1. Punctate calculus in the interpolar region of the left kidney without evidence of hydronephrosis or ureteral calculus. 2. No evidence of right nephrolithiasis or hydronephrosis. Complex cyst in the posterior aspect of the inter polar region, unchanged, evaluation is limited on this noncontrast enhanced examination. 3. Bowel loops are normal in caliber. Normal appendix. No evidence of colitis or diverticulitis. 4.  No CT evidence of acute abdominal/pelvic process. Electronically Signed   By: Keane Police D.O.   On: 07/09/2021 12:46    Procedures Procedures   Medications Ordered in ED Medications  morphine (PF) 4 MG/ML injection 4 mg (4 mg Intravenous Given 07/09/21 1258)  HYDROmorphone (DILAUDID) injection 0.5 mg (0.5 mg Intravenous Given 07/09/21 1521)    ED Course/ Medical Decision Making/ A&P                           Medical Decision Making Amount and/or Complexity of Data Reviewed Labs: ordered. Radiology: ordered.  Risk Prescription drug management.    MDM  This is a 66 y.o. male who presents to the ED with right flank pain The differential of this patient includes but is not limited to biopsy related hematoma, pyelonephritis, urolithiasis, UTI, appendicitis   My Impression, Plan, and ED Course: Patient appears uncomfortable. He has stable vitals. He returns with continued and worsening right flank pain following renal biopsy and ablation 10 days ago. There  is concern for complication given continued pain.   I personally ordered, reviewed, and interpreted all laboratory work and imaging and agree with radiologist interpretation. Results interpreted below: CBC is with no leukocytosis.  Hemoglobin is 9.6 which is stable from baseline.  CMP reveals stable creatinine  with 2.64.  Other findings are nonspecific.  Lipase is 60 which is not elevated enough to be concern for acute pancreatitis.  Urinalysis does have some blood in it which is probably to be expected following a biopsy.  No signs of infection noted.  Urine culture was sent out. CT stone study does not reveal any evidence of obstructive stone, hydronephrosis, or associated hematoma.  No evidence of any stranding to suggest pyelonephritis or any concern for a UTI.  Appendix is normal without any signs of appendicitis.  No evidence of diverticulitis.  Gallbladder appears normal.  No acute findings to explain patient's continued right flank pain.  Pain is improved on reassessment. I will refill pain meds for a couple more days. Do not think patient meets any admission criteria at this time.  I recommend calling the provider who did the procedure to try to schedule and earlier appointment regarding continued pain. He is instructed to return if worsening pain, blood clots in urine, inability to urinate, fevers, or other new concerning symptoms occur.    Charting Requirements Additional history is obtained from:  Independent historian External Records from outside source obtained and reviewed including: Reviewed recent discharge summary and op note. Reviewed recent ID clinic note. Reviewed prior labs Social Determinants of Health:  none Pertinant PMH that complicates patient's illness: recent renal biopsy, papillary renal cell carcinoma  Patient Care Problems that were addressed during this visit: - Right flank pain: Acute illness with systemic symptoms Medications given in ED: morphine,  dilaudid Reevaluation of the patient after these medicines showed that the patient improved I have reviewed home medications and made changes accordingly. Prescribed course of Percocet Disposition: discharge, return precautions, f/u with IR and urology  Portions of this note were generated with Dragon dictation software. Dictation errors may occur despite best attempts at proofreading.     Final Clinical Impression(s) / ED Diagnoses Final diagnoses:  Right flank pain    Rx / DC Orders ED Discharge Orders          Ordered    oxyCODONE-acetaminophen (PERCOCET/ROXICET) 5-325 MG tablet  Every 6 hours PRN        07/09/21 1454              Temica Righetti, Adora Fridge, PA-C 07/09/21 1526    Godfrey Pick, MD 07/09/21 1733

## 2021-07-09 NOTE — ED Triage Notes (Signed)
Provider at Pt bedside to eval.

## 2021-07-09 NOTE — ED Provider Notes (Signed)
Accepted handoff at shift change from G. L. PA-C. Please see prior provider note for more detail.   Briefly: Patient is 66 y.o.   DDX: concern for rhabdomyolysis   Plan:     Physical Exam  BP (!) 182/88   Pulse 65   Temp 98.4 F (36.9 C) (Oral)   Resp 18   SpO2 100%   Physical Exam  Procedures  Procedures Results for orders placed or performed during the hospital encounter of 07/09/21  Urinalysis, Routine w reflex microscopic Urine, Clean Catch  Result Value Ref Range   Color, Urine YELLOW YELLOW   APPearance CLEAR CLEAR   Specific Gravity, Urine 1.012 1.005 - 1.030   pH 5.0 5.0 - 8.0   Glucose, UA NEGATIVE NEGATIVE mg/dL   Hgb urine dipstick LARGE (A) NEGATIVE   Bilirubin Urine NEGATIVE NEGATIVE   Ketones, ur NEGATIVE NEGATIVE mg/dL   Protein, ur NEGATIVE NEGATIVE mg/dL   Nitrite NEGATIVE NEGATIVE   Leukocytes,Ua NEGATIVE NEGATIVE   RBC / HPF 0-5 0 - 5 RBC/hpf   WBC, UA 0-5 0 - 5 WBC/hpf   Bacteria, UA NONE SEEN NONE SEEN   Squamous Epithelial / LPF 0-5 0 - 5   Mucus PRESENT    Non Squamous Epithelial 0-5 (A) NONE SEEN  Comprehensive metabolic panel  Result Value Ref Range   Sodium 140 135 - 145 mmol/L   Potassium 4.2 3.5 - 5.1 mmol/L   Chloride 110 98 - 111 mmol/L   CO2 24 22 - 32 mmol/L   Glucose, Bld 86 70 - 99 mg/dL   BUN 31 (H) 8 - 23 mg/dL   Creatinine, Ser 2.64 (H) 0.61 - 1.24 mg/dL   Calcium 8.6 (L) 8.9 - 10.3 mg/dL   Total Protein 6.6 6.5 - 8.1 g/dL   Albumin 2.9 (L) 3.5 - 5.0 g/dL   AST 15 15 - 41 U/L   ALT 16 0 - 44 U/L   Alkaline Phosphatase 50 38 - 126 U/L   Total Bilirubin 0.5 0.3 - 1.2 mg/dL   GFR, Estimated 26 (L) >60 mL/min   Anion gap 6 5 - 15  Lipase, blood  Result Value Ref Range   Lipase 60 (H) 11 - 51 U/L  CBC with Differential (PNL)  Result Value Ref Range   WBC 8.9 4.0 - 10.5 K/uL   RBC 3.01 (L) 4.22 - 5.81 MIL/uL   Hemoglobin 9.6 (L) 13.0 - 17.0 g/dL   HCT 28.9 (L) 39.0 - 52.0 %   MCV 96.0 80.0 - 100.0 fL   MCH 31.9 26.0  - 34.0 pg   MCHC 33.2 30.0 - 36.0 g/dL   RDW 15.5 11.5 - 15.5 %   Platelets 317 150 - 400 K/uL   nRBC 0.0 0.0 - 0.2 %   Neutrophils Relative % 64 %   Neutro Abs 5.7 1.7 - 7.7 K/uL   Lymphocytes Relative 25 %   Lymphs Abs 2.2 0.7 - 4.0 K/uL   Monocytes Relative 7 %   Monocytes Absolute 0.6 0.1 - 1.0 K/uL   Eosinophils Relative 3 %   Eosinophils Absolute 0.3 0.0 - 0.5 K/uL   Basophils Relative 0 %   Basophils Absolute 0.0 0.0 - 0.1 K/uL   Immature Granulocytes 1 %   Abs Immature Granulocytes 0.06 0.00 - 0.07 K/uL  CK  Result Value Ref Range   Total CK 76 49 - 397 U/L   DG Chest 1 View  Result Date: 06/29/2021 CLINICAL DATA:  Preop chest  x-ray EXAM: CHEST  1 VIEW COMPARISON:  04/19/2021 FINDINGS: The heart size and mediastinal contours are within normal limits. Both lungs are clear. The visualized skeletal structures are unremarkable. IMPRESSION: No active disease. Electronically Signed   By: Franchot Gallo M.D.   On: 06/29/2021 09:57   CT GUIDE TISSUE ABLATION  Addendum Date: 06/30/2021   ADDENDUM REPORT: 06/30/2021 10:36 ADDENDUM: RADIATION DOSE REDUCTION: This exam was performed according to the departmental dose-optimization program which includes automated exposure control, adjustment of the mA and/or kV according to patient size and/or use of iterative reconstruction technique. Electronically Signed   By: Markus Daft M.D.   On: 06/30/2021 10:36   Result Date: 06/30/2021 INDICATION: 66 year old with a suspicious right renal lesion. Scheduled for image guided cryoablation and biopsy. EXAM: CT AND ULTRASOUND GUIDED CRYOABLATION OF RIGHT RENAL LESION IMAGE GUIDED BIOPSY OF RIGHT RENAL LESION COMPARISON:  None Available. MEDICATIONS: Rocephin 1 g; The antibiotic was administered in an appropriate time interval prior to needle puncture of the skin. ANESTHESIA/SEDATION: General - as administered by the Anesthesia department FLUOROSCOPY: None COMPLICATIONS: None immediate. TECHNIQUE: Informed  written consent was obtained from the patient after a thorough discussion of the procedural risks, benefits and alternatives. All questions were addressed. A timeout was performed prior to the initiation of the procedure. Patient was intubated by anesthesia. Patient was placed prone. Right renal lesion was identified with ultrasound. CT images of the abdomen were also obtained. The right renal lesion was identified with CT. Right flank was prepped with chlorhexidine and sterile field was created. Maximal barrier sterile technique was utilized including caps, mask, sterile gowns, sterile gloves, sterile drape, hand hygiene and skin antiseptic. Using ultrasound guidance, 2 Cryocare PCS-17 needles were directed into the right kidney. Needles were repositioned multiple times and needles were directed along the long axis of the lesion using ultrasound guidance. One lesion was placed deeper into the kidney and the other was placed more superficial near the periphery. Needle positioning was confirmed with CT. Subsequently, hydro dissection was performed using at dilute contrast through a [place] needle. Approximately 400 mL of hydro dissection was performed around the right kidney. Follow up CT images were obtained to evaluate the hydro dissection. Using ultrasound guidance, a 17 gauge coaxial needle was directed into the lesion and 2 core biopsies were obtained with an 18 gauge device. Specimens placed in formalin. Cryoablation was performed with 9 minute freeze, 6 minute thaw and a second 9 minute freeze. Both needles removed after final 6 minute thaw. Final CT images were obtained. Bandages were placed. Patient was taken to the PACU for recovery. FINDINGS: Ultrasound and CT demonstrated a slightly exophytic lesion along the posterior right kidney. Lesion measured approximately 1.4 cm on CT. This lesion was hypoechoic on ultrasound. Due to the overlying rib, the lesion was best accessed using ultrasound guidance  with the lesion elongated and needle was directed from a slightly caudal to cranial approach. Needles were placed in near parallel configuration. Hydro dissection was performed around the right kidney to protect the intercostal space. Ice balls were confirmed on CT images during the freeze. Ice ball appeared to be encompassing the suspicious lesion. There is probably a small amount of ice ball extending into the posterior intercostal space as well. No immediate bleeding or hematoma formations. IMPRESSION: Image guided biopsy and cryoablation of right renal lesion. Electronically Signed: By: Markus Daft M.D. On: 06/30/2021 08:02   CT BIOPSY  Addendum Date: 06/30/2021   ADDENDUM REPORT: 06/30/2021  10:36 ADDENDUM: RADIATION DOSE REDUCTION: This exam was performed according to the departmental dose-optimization program which includes automated exposure control, adjustment of the mA and/or kV according to patient size and/or use of iterative reconstruction technique. Electronically Signed   By: Markus Daft M.D.   On: 06/30/2021 10:36   Result Date: 06/30/2021 INDICATION: 66 year old with a suspicious right renal lesion. Scheduled for image guided cryoablation and biopsy. EXAM: CT AND ULTRASOUND GUIDED CRYOABLATION OF RIGHT RENAL LESION IMAGE GUIDED BIOPSY OF RIGHT RENAL LESION COMPARISON:  None Available. MEDICATIONS: Rocephin 1 g; The antibiotic was administered in an appropriate time interval prior to needle puncture of the skin. ANESTHESIA/SEDATION: General - as administered by the Anesthesia department FLUOROSCOPY: None COMPLICATIONS: None immediate. TECHNIQUE: Informed written consent was obtained from the patient after a thorough discussion of the procedural risks, benefits and alternatives. All questions were addressed. A timeout was performed prior to the initiation of the procedure. Patient was intubated by anesthesia. Patient was placed prone. Right renal lesion was identified with ultrasound. CT images of  the abdomen were also obtained. The right renal lesion was identified with CT. Right flank was prepped with chlorhexidine and sterile field was created. Maximal barrier sterile technique was utilized including caps, mask, sterile gowns, sterile gloves, sterile drape, hand hygiene and skin antiseptic. Using ultrasound guidance, 2 Cryocare PCS-17 needles were directed into the right kidney. Needles were repositioned multiple times and needles were directed along the long axis of the lesion using ultrasound guidance. One lesion was placed deeper into the kidney and the other was placed more superficial near the periphery. Needle positioning was confirmed with CT. Subsequently, hydro dissection was performed using at dilute contrast through a [place] needle. Approximately 400 mL of hydro dissection was performed around the right kidney. Follow up CT images were obtained to evaluate the hydro dissection. Using ultrasound guidance, a 17 gauge coaxial needle was directed into the lesion and 2 core biopsies were obtained with an 18 gauge device. Specimens placed in formalin. Cryoablation was performed with 9 minute freeze, 6 minute thaw and a second 9 minute freeze. Both needles removed after final 6 minute thaw. Final CT images were obtained. Bandages were placed. Patient was taken to the PACU for recovery. FINDINGS: Ultrasound and CT demonstrated a slightly exophytic lesion along the posterior right kidney. Lesion measured approximately 1.4 cm on CT. This lesion was hypoechoic on ultrasound. Due to the overlying rib, the lesion was best accessed using ultrasound guidance with the lesion elongated and needle was directed from a slightly caudal to cranial approach. Needles were placed in near parallel configuration. Hydro dissection was performed around the right kidney to protect the intercostal space. Ice balls were confirmed on CT images during the freeze. Ice ball appeared to be encompassing the suspicious  lesion. There is probably a small amount of ice ball extending into the posterior intercostal space as well. No immediate bleeding or hematoma formations. IMPRESSION: Image guided biopsy and cryoablation of right renal lesion. Electronically Signed: By: Markus Daft M.D. On: 06/30/2021 08:02   CT RENAL STONE STUDY  Result Date: 07/09/2021 CLINICAL DATA:  Flank pain, kidney stone suspected. EXAM: CT ABDOMEN AND PELVIS WITHOUT CONTRAST TECHNIQUE: Multidetector CT imaging of the abdomen and pelvis was performed following the standard protocol without IV contrast. RADIATION DOSE REDUCTION: This exam was performed according to the departmental dose-optimization program which includes automated exposure control, adjustment of the mA and/or kV according to patient size and/or use of iterative reconstruction  technique. COMPARISON:  CT examination dated April 15, 2021 FINDINGS: Lower chest: No acute abnormality. Hepatobiliary: No focal liver abnormality is seen. No gallstones, gallbladder wall thickening, or biliary dilatation. Pancreas: Unremarkable. No pancreatic ductal dilatation or surrounding inflammatory changes. Spleen: Normal in size without focal abnormality. Adrenals/Urinary Tract: Adrenal glands are unremarkable. Punctate nonobstructing calculus in the left renal pelvis. No evidence of hydronephrosis or ureteral calculus. Hypodense complex cystic structure in the interpolar region of the right kidney, not well visualized on this noncontrast examination, likely proteinaceous cyst. Bladder is unremarkable. Stomach/Bowel: Stomach is within normal limits. Appendix appears normal. No evidence of bowel wall thickening, distention, or inflammatory changes. Vascular/Lymphatic: Aortic atherosclerosis. No enlarged abdominal or pelvic lymph nodes. Reproductive: Prostate is unremarkable. Other: No abdominal wall hernia or abnormality. No abdominopelvic ascites. Musculoskeletal: Mild degenerative disease of the lumbar spine.  No acute osseous abnormality. IMPRESSION: 1. Punctate calculus in the interpolar region of the left kidney without evidence of hydronephrosis or ureteral calculus. 2. No evidence of right nephrolithiasis or hydronephrosis. Complex cyst in the posterior aspect of the inter polar region, unchanged, evaluation is limited on this noncontrast enhanced examination. 3. Bowel loops are normal in caliber. Normal appendix. No evidence of colitis or diverticulitis. 4.  No CT evidence of acute abdominal/pelvic process. Electronically Signed   By: Keane Police D.O.   On: 07/09/2021 12:46      ED Course / MDM    Medical Decision Making Amount and/or Complexity of Data Reviewed Labs: ordered. Radiology: ordered.  Risk Prescription drug management.   CK within normal limits.  Patient has received 1 L of crystalloid.  Will recommend continued follow-up with PCP.  Return precautions discussed.       Pati Gallo [place], [name] 07/09/21 1635    Hayden Rasmussen, MD 07/10/21 1032

## 2021-07-09 NOTE — ED Notes (Signed)
Pt is calling Daughter to come back to [facility] .Daughter to drive Pt to ED

## 2021-07-09 NOTE — ED Triage Notes (Signed)
Pt had R renal cryoablation on 5/17.  Reports R flank pain and states he is out of pain medication.  Denies dysuria.

## 2021-07-09 NOTE — Discharge Instructions (Addendum)
I recommend you report to the nearest emergency department for further evaluation of possible postoperative complication related to your right kidney.

## 2021-07-10 ENCOUNTER — Emergency Department (HOSPITAL_COMMUNITY)
Admission: EM | Admit: 2021-07-10 | Discharge: 2021-07-10 | Disposition: A | Discharge status: Home / Self Care | Payer: Medicaid Other | Attending: Emergency Medicine | Admitting: Emergency Medicine

## 2021-07-10 ENCOUNTER — Emergency Department (HOSPITAL_COMMUNITY): Payer: Medicaid Other

## 2021-07-10 ENCOUNTER — Encounter (HOSPITAL_COMMUNITY): Payer: Self-pay

## 2021-07-10 DIAGNOSIS — R0602 Shortness of breath: Secondary | ICD-10-CM | POA: Insufficient documentation

## 2021-07-10 DIAGNOSIS — R7989 Other specified abnormal findings of blood chemistry: Secondary | ICD-10-CM | POA: Insufficient documentation

## 2021-07-10 DIAGNOSIS — R1011 Right upper quadrant pain: Secondary | ICD-10-CM | POA: Diagnosis present

## 2021-07-10 DIAGNOSIS — R109 Unspecified abdominal pain: Secondary | ICD-10-CM

## 2021-07-10 LAB — I-STAT CHEM 8, ED
BUN: 23 mg/dL (ref 8–23)
Calcium, Ion: 1.18 mmol/L (ref 1.15–1.40)
Chloride: 108 mmol/L (ref 98–111)
Creatinine, Ser: 1.9 mg/dL — ABNORMAL HIGH (ref 0.61–1.24)
Glucose, Bld: 88 mg/dL (ref 70–99)
HCT: 27 % — ABNORMAL LOW (ref 39.0–52.0)
Hemoglobin: 9.2 g/dL — ABNORMAL LOW (ref 13.0–17.0)
Potassium: 3.7 mmol/L (ref 3.5–5.1)
Sodium: 141 mmol/L (ref 135–145)
TCO2: 24 mmol/L (ref 22–32)

## 2021-07-10 LAB — URINE CULTURE: Culture: <10000 — AB

## 2021-07-10 MED ORDER — FENTANYL CITRATE PF 50 MCG/ML IJ SOSY
75.0000 ug | PREFILLED_SYRINGE | Freq: Once | INTRAMUSCULAR | Status: AC
  Administered 2021-07-10: 75 ug via INTRAVENOUS
  Filled 2021-07-10: qty 2

## 2021-07-10 MED ORDER — MORPHINE SULFATE 15 MG PO TABS
15.0000 mg | ORAL_TABLET | ORAL | Status: DC | PRN
  Administered 2021-07-10: 15 mg via ORAL
  Filled 2021-07-10: qty 1

## 2021-07-10 MED ORDER — MORPHINE SULFATE 15 MG PO TABS: 15.0000 mg | ORAL_TABLET | Freq: Four times a day (QID) | ORAL | 0 refills | Status: DC | PRN

## 2021-07-10 MED ORDER — HYDROMORPHONE HCL 1 MG/ML IJ SOLN: 1.0000 mg | Freq: Once | INTRAMUSCULAR | Status: DC

## 2021-07-10 NOTE — ED Provider Notes (Addendum)
[facility] DEPT Provider Note   CSN: [id number] Arrival date & time: 07/10/21  0725     History  Chief Complaint  Patient presents with   Shortness of Breath    Kevin [name] is a 66 y.o. male.  HPI     Pt comes in with cc of flank pain. I asked him if his chief complaint is shortness of breath, any corrected and said his main reason for being in the ER his flank pain.  Patient indicates that he had biopsy for renal mass done about 10 days ago.  He has had persistent and constant right flank pain since then.  His pain is severe, he is not able to sleep well because of the pain.  He denies any associated blood in the urine.  Home Medications Prior to Admission medications   Medication Sig Start Date End Date Taking? Authorizing Provider  albuterol (PROVENTIL) (2.5 MG/3ML) 0.083% nebulizer solution Take 3 mLs (2.5 mg total) by nebulization every 4 (four) hours as needed for wheezing or shortness of breath. Patient taking differently: Take 5 mg by nebulization daily as needed for wheezing or shortness of breath. 05/13/21  Yes [name] Henle, MD  albuterol (VENTOLIN HFA) 108 (90 Base) MCG/ACT inhaler Inhale 2 puffs into the lungs every 6 (six) hours as needed for wheezing or shortness of breath. Patient taking differently: Inhale 2 puffs into the lungs 2 (two) times daily as needed for wheezing or shortness of breath. 04/19/21  Yes Raspet, Erin K, PA-C  dolutegravir-lamiVUDine (DOVATO) 50-300 MG tablet Take 1 tablet by mouth daily. 06/15/21  Yes Truman Hayward, MD  Multiple Vitamin (MULTIVITAMIN) tablet Take 1 tablet by mouth daily.   Yes [provider]  valsartan-hydrochlorothiazide (DIOVAN-HCT) 80-12.5 MG tablet Take 1 tablet by mouth daily. 06/09/21  Yes [provider]  amLODipine (NORVASC) 10 MG tablet Take 10 mg by mouth daily. Patient not taking: Reported on 07/10/2021    [provider]   bictegravir-emtricitabine-tenofovir AF (BIKTARVY) 50-200-25 MG TABS tablet Take 1 tablet by mouth daily. Patient not taking: Reported on 07/10/2021    [provider]  escitalopram (LEXAPRO) 10 MG tablet Take by mouth. 06/16/21   [provider]  mometasone-formoterol (DULERA) 100-5 MCG/ACT AERO Inhale 2 puffs into the lungs 2 (two) times daily. Patient not taking: Reported on 07/10/2021    [provider]  morphine (MSIR) 15 MG tablet Take 1 tablet (15 mg total) by mouth every 6 (six) hours as needed for severe pain. 07/10/21   Varney Biles, MD      Allergies    Patient has no known allergies.    Review of Systems   Review of Systems  Physical Exam Updated Vital Signs BP 120/85   Pulse 63   Resp 14   SpO2 99%  Physical Exam Vitals and nursing note reviewed.  Constitutional:      Appearance: He is well-developed.  HENT:     Head: Atraumatic.  Cardiovascular:     Rate and Rhythm: Normal rate.  Pulmonary:     Effort: Pulmonary effort is normal.     Breath sounds: No decreased breath sounds, wheezing, rhonchi or rales.  Abdominal:     Comments: Patient has tenderness over the right upper quadrant, right flank region. The biopsy incision site looks clean  Musculoskeletal:     Cervical back: Neck supple.  Skin:    General: Skin is warm.  Neurological:     Mental Status:  He is alert and oriented to person, place, and time.    ED Results / Procedures / Treatments   Labs (all labs ordered are listed, but only abnormal results are displayed) Labs Reviewed  I-STAT CHEM 8, ED - Abnormal; Notable for the following components:      Result Value   Creatinine, Ser 1.90 (*)    Hemoglobin 9.2 (*)    HCT 27.0 (*)    All other components within normal limits    EKG None  Radiology US Abdomen Complete  Result Date: 07/10/2021 CLINICAL DATA:  Right upper quadrant abdominal pain and right flank pain for 5 days. EXAM: ABDOMEN ULTRASOUND COMPLETE  COMPARISON:  Prior CTs, most recent dated 07/09/2021. FINDINGS: Gallbladder: No gallstones or wall thickening visualized. No sonographic Murphy sign noted by sonographer. Common bile duct: Diameter: 3 mm Liver: No focal lesion identified. Within normal limits in parenchymal echogenicity. Portal vein is patent on color Doppler imaging with normal direction of blood flow towards the liver. IVC: No abnormality visualized. Pancreas: Visualized portion unremarkable. Spleen: Partly obscured but overall normal in size and appearance. Right Kidney: Length: 10.1 cm. Normal overall parenchymal echogenicity. Vague hypoechoic area in the renal sinus region of the midpole, 2.1 x 1.6 x 2.0 cm. Tiny cortical cystic lesion, 5 mm, midpole. No other masses, no stones and no hydronephrosis. Left Kidney: Length: 9.8 cm. Normal parenchymal echogenicity. Simple cyst arises from the lower pole, 9 mm. No other masses, no stones and no hydronephrosis. Abdominal aorta: No aneurysm visualized. Other findings: None. IMPRESSION: 1. No acute findings.  Normal gallbladder.  No bile duct dilation. 2. Hypoechoic area within the midpole the right kidney, corresponding to the hypoechoic area noted on the previous day's CT. This also corresponds to a lesion noted on MRI dated 05/14/2021 that subsequently underwent CT-guided ablation. 3. 5 mm cystic lesion in the right kidney, too small to fully characterize but likely a simple cyst, and corresponding to a simple cyst present on the prior MRI. 4. 9 mm simple cyst arising from the left kidney. Electronically Signed   By: Lajean Manes M.D.   On: 07/10/2021 11:02   CT RENAL STONE STUDY  Result Date: 07/09/2021 CLINICAL DATA:  Flank pain, kidney stone suspected. EXAM: CT ABDOMEN AND PELVIS WITHOUT CONTRAST TECHNIQUE: Multidetector CT imaging of the abdomen and pelvis was performed following the standard protocol without IV contrast. RADIATION DOSE REDUCTION: This exam was performed according to the  departmental dose-optimization program which includes automated exposure control, adjustment of the mA and/or kV according to patient size and/or use of iterative reconstruction technique. COMPARISON:  CT examination dated April 15, 2021 FINDINGS: Lower chest: No acute abnormality. Hepatobiliary: No focal liver abnormality is seen. No gallstones, gallbladder wall thickening, or biliary dilatation. Pancreas: Unremarkable. No pancreatic ductal dilatation or surrounding inflammatory changes. Spleen: Normal in size without focal abnormality. Adrenals/Urinary Tract: Adrenal glands are unremarkable. Punctate nonobstructing calculus in the left renal pelvis. No evidence of hydronephrosis or ureteral calculus. Hypodense complex cystic structure in the interpolar region of the right kidney, not well visualized on this noncontrast examination, likely proteinaceous cyst. Bladder is unremarkable. Stomach/Bowel: Stomach is within normal limits. Appendix appears normal. No evidence of bowel wall thickening, distention, or inflammatory changes. Vascular/Lymphatic: Aortic atherosclerosis. No enlarged abdominal or pelvic lymph nodes. Reproductive: Prostate is unremarkable. Other: No abdominal wall hernia or abnormality. No abdominopelvic ascites. Musculoskeletal: Mild degenerative disease of the lumbar spine. No acute osseous abnormality. IMPRESSION: 1. Punctate calculus in the interpolar region  of the left kidney without evidence of hydronephrosis or ureteral calculus. 2. No evidence of right nephrolithiasis or hydronephrosis. Complex cyst in the posterior aspect of the inter polar region, unchanged, evaluation is limited on this noncontrast enhanced examination. 3. Bowel loops are normal in caliber. Normal appendix. No evidence of colitis or diverticulitis. 4.  No CT evidence of acute abdominal/pelvic process. Electronically Signed   By: Keane Police D.O.   On: 07/09/2021 12:46    Procedures Procedures    Medications Ordered  in ED Medications  morphine (MSIR) tablet 15 mg (15 mg Oral Given 07/10/21 0956)  fentaNYL (SUBLIMAZE) injection 75 mcg (75 mcg Intravenous Given 07/10/21 0840)    ED Course/ Medical Decision Making/ A&P Clinical Course as of 07/10/21 1421  Sun Jul 10, 2021  0949 Creatinine(!): 1.90 Independently interpreted Chem-8.  Patient's creatinine today is better than it was yesterday.  Hemoglobin remains at baseline at 9.2. [AN]  0950 Ultrasound results do not indicate any new concerning findings.  Gallbladder looks fine as well.  Stable for discharge.  We will prescribe him morphine as he has run out of his Percocet.  Interventional radiology team indicated that they will reach out to the patient for a follow-up appointment later this week. [AN]  1133 The patient appears reasonably screened and/or stabilized for discharge and I doubt any other medical condition or other [facility] requiring further screening, evaluation, or treatment in the ED at this time prior to discharge.   Results from the ER workup discussed with the patient face to face and all questions answered to the best of my ability. The patient is safe for discharge with strict return precautions.   [AN]  1421 Patient had difficulty filling up his prescription at 2 separate pharmacies.  Eventually he picked it up from [place] location.  I have called [facility] on [address] and requested them to cancel the prescription that was sent earlier. [AN]    Clinical Course User Index [AN] Varney Biles, MD                           Medical Decision Making Amount and/or Complexity of Data Reviewed Labs:  Decision-making details documented in ED Course. Radiology: ordered.  Risk Prescription drug management.   This patient presents to the ED with chief complaint(s) of right-sided flank pain, right-sided abdominal pain with pertinent past medical history of renal mass, status post ablation and biopsy, with ablation being 10 days ago which  further complicates the presenting complaint. The complaint involves an extensive differential diagnosis and also carries with it a high risk of complications and morbidity.    The differential diagnosis includes : Tumor infarction, renal infarction, perinephric hematoma, cholelithiasis.  The initial plan is to order ultrasound of the abdomen and repeat CHEM 8.   Additional history obtained: Records reviewed previous admission documents and IR procedure.  Patient's CT renal stone was reviewed from yesterday.  There is some cystic appearance over the affected side.  UA was negative for hematuria and creatinine was slightly elevated than baseline   Consultation: Discussed case with Dr. Jeananne Rama -interventional radiology.  Him and I reviewed patient's CT scan from yesterday and also discussed the recent ablation.  CT scan from yesterday is overall reassuring.  No evidence of any bleeding.  IR team will contact patient to give him a close follow-up.  Final Clinical Impression(s) / ED Diagnoses Final diagnoses:  Flank pain    Rx /  DC Orders ED Discharge Orders          Ordered    morphine (MSIR) 15 MG tablet  Every 6 hours PRN,   Status:  Discontinued        07/10/21 1130    morphine (MSIR) 15 MG tablet  Every 6 hours PRN,   Status:  Discontinued        07/10/21 1333    morphine (MSIR) 15 MG tablet  Every 6 hours PRN        07/10/21 1356              Varney Biles, MD 07/10/21 [place], [place], MD 07/10/21 1421

## 2021-07-10 NOTE — Discharge Instructions (Signed)
The results in the ER are reassuring. We have discussed the case with interventional radiology team, they will contact you on Tuesday for a follow-up appointment.  In the interim, take the medications as prescribed for pain control.

## 2021-07-10 NOTE — ED Notes (Signed)
Pt states understanding of dc instructions, importance of follow up, and prescription. Pt denies questions or concerns upon dc. Pt declined wheelchair assistance upon dc. Pt ambulated out of ed w/ steady gait. No belongings left in room upon dc.  

## 2021-07-10 NOTE — ED Triage Notes (Signed)
Patient coming from home with c/o SOB. Hx of COPD. Patient is c/o right flank pain. [facility]. Patient report taking pain medication at 6:30 am.

## 2021-07-12 ENCOUNTER — Telehealth: Payer: Self-pay | Admitting: Diagnostic Radiology

## 2021-07-12 ENCOUNTER — Other Ambulatory Visit (HOSPITAL_COMMUNITY): Payer: Self-pay

## 2021-07-12 NOTE — Progress Notes (Signed)
Patient ID: Kevin [name], male   DOB: March 20, 1955, 66 y.o.   MRN: [id number] Telephone follow up:  Patient was in ED this past weekend for flank pain.  Had a CT that did not show any complications from the right renal biopsy or ablation. Patient was given pain medications in ED and has morphine at home.  Patient states that the pain has almost completely subsided.  He rates the pain as 3/10.  He has not taken pain medication today but still has morphine if he needs it.  Patient will take pain medications as needed but suspect the post ablation symptoms will continue to improve over time.  Patient is scheduled to follow up in clinic in 2 weeks.

## 2021-07-13 ENCOUNTER — Emergency Department (HOSPITAL_COMMUNITY): Payer: Medicaid Other

## 2021-07-13 ENCOUNTER — Inpatient Hospital Stay (HOSPITAL_COMMUNITY)
Admission: EM | Admit: 2021-07-13 | Discharge: 2021-07-17 | DRG: 304 | Disposition: A | Discharge status: Home / Self Care | Payer: Medicaid Other | Attending: Internal Medicine | Admitting: Internal Medicine

## 2021-07-13 ENCOUNTER — Other Ambulatory Visit: Payer: Self-pay

## 2021-07-13 ENCOUNTER — Encounter (HOSPITAL_COMMUNITY): Payer: Self-pay

## 2021-07-13 DIAGNOSIS — C649 Malignant neoplasm of unspecified kidney, except renal pelvis: Secondary | ICD-10-CM | POA: Diagnosis present

## 2021-07-13 DIAGNOSIS — I10 Essential (primary) hypertension: Secondary | ICD-10-CM | POA: Diagnosis present

## 2021-07-13 DIAGNOSIS — N19 Unspecified kidney failure: Secondary | ICD-10-CM

## 2021-07-13 DIAGNOSIS — J441 Chronic obstructive pulmonary disease with (acute) exacerbation: Secondary | ICD-10-CM | POA: Diagnosis present

## 2021-07-13 DIAGNOSIS — Z79899 Other long term (current) drug therapy: Secondary | ICD-10-CM

## 2021-07-13 DIAGNOSIS — Z20822 Contact with and (suspected) exposure to covid-19: Secondary | ICD-10-CM | POA: Diagnosis present

## 2021-07-13 DIAGNOSIS — M109 Gout, unspecified: Secondary | ICD-10-CM | POA: Diagnosis present

## 2021-07-13 DIAGNOSIS — I130 Hypertensive heart and chronic kidney disease with heart failure and stage 1 through stage 4 chronic kidney disease, or unspecified chronic kidney disease: Secondary | ICD-10-CM | POA: Diagnosis present

## 2021-07-13 DIAGNOSIS — R778 Other specified abnormalities of plasma proteins: Secondary | ICD-10-CM

## 2021-07-13 DIAGNOSIS — I739 Peripheral vascular disease, unspecified: Secondary | ICD-10-CM | POA: Diagnosis present

## 2021-07-13 DIAGNOSIS — I480 Paroxysmal atrial fibrillation: Secondary | ICD-10-CM | POA: Diagnosis present

## 2021-07-13 DIAGNOSIS — F17210 Nicotine dependence, cigarettes, uncomplicated: Secondary | ICD-10-CM | POA: Diagnosis present

## 2021-07-13 DIAGNOSIS — R0602 Shortness of breath: Secondary | ICD-10-CM | POA: Diagnosis present

## 2021-07-13 DIAGNOSIS — G4733 Obstructive sleep apnea (adult) (pediatric): Secondary | ICD-10-CM | POA: Diagnosis present

## 2021-07-13 DIAGNOSIS — N1831 Chronic kidney disease, stage 3a: Secondary | ICD-10-CM | POA: Diagnosis present

## 2021-07-13 DIAGNOSIS — I5033 Acute on chronic diastolic (congestive) heart failure: Secondary | ICD-10-CM | POA: Diagnosis present

## 2021-07-13 DIAGNOSIS — N183 Chronic kidney disease, stage 3 unspecified: Secondary | ICD-10-CM | POA: Diagnosis present

## 2021-07-13 DIAGNOSIS — Z21 Asymptomatic human immunodeficiency virus [HIV] infection status: Secondary | ICD-10-CM | POA: Diagnosis present

## 2021-07-13 DIAGNOSIS — Z8249 Family history of ischemic heart disease and other diseases of the circulatory system: Secondary | ICD-10-CM

## 2021-07-13 DIAGNOSIS — N1832 Chronic kidney disease, stage 3b: Secondary | ICD-10-CM | POA: Diagnosis present

## 2021-07-13 DIAGNOSIS — I5031 Acute diastolic (congestive) heart failure: Secondary | ICD-10-CM | POA: Diagnosis present

## 2021-07-13 DIAGNOSIS — I249 Acute ischemic heart disease, unspecified: Secondary | ICD-10-CM

## 2021-07-13 DIAGNOSIS — B20 Human immunodeficiency virus [HIV] disease: Secondary | ICD-10-CM | POA: Diagnosis present

## 2021-07-13 DIAGNOSIS — I169 Hypertensive crisis, unspecified: Principal | ICD-10-CM | POA: Diagnosis present

## 2021-07-13 LAB — BASIC METABOLIC PANEL
Anion gap: 6 (ref 5–15)
BUN: 25 mg/dL — ABNORMAL HIGH (ref 8–23)
CO2: 21 mmol/L — ABNORMAL LOW (ref 22–32)
Calcium: 8.6 mg/dL — ABNORMAL LOW (ref 8.9–10.3)
Chloride: 109 mmol/L (ref 98–111)
Creatinine, Ser: 2.1 mg/dL — ABNORMAL HIGH (ref 0.61–1.24)
GFR, Estimated: 35 mL/min — ABNORMAL LOW (ref >60)
Glucose, Bld: 113 mg/dL — ABNORMAL HIGH (ref 70–99)
Potassium: 3.8 mmol/L (ref 3.5–5.1)
Sodium: 136 mmol/L (ref 135–145)

## 2021-07-13 LAB — TROPONIN I (HIGH SENSITIVITY): Troponin I (High Sensitivity): 50 ng/L — ABNORMAL HIGH (ref <18)

## 2021-07-13 LAB — CBC
HCT: 30.3 % — ABNORMAL LOW (ref 39.0–52.0)
Hemoglobin: 10.5 g/dL — ABNORMAL LOW (ref 13.0–17.0)
MCH: 32.6 pg (ref 26.0–34.0)
MCHC: 34.7 g/dL (ref 30.0–36.0)
MCV: 94.1 fL (ref 80.0–100.0)
Platelets: 393 10*3/uL (ref 150–400)
RBC: 3.22 MIL/uL — ABNORMAL LOW (ref 4.22–5.81)
RDW: 15.8 % — ABNORMAL HIGH (ref 11.5–15.5)
WBC: 11.8 10*3/uL — ABNORMAL HIGH (ref 4.0–10.5)
nRBC: 0 % (ref 0.0–0.2)

## 2021-07-13 LAB — SARS CORONAVIRUS 2 BY RT PCR: SARS Coronavirus 2 by RT PCR: NEGATIVE

## 2021-07-13 LAB — BRAIN NATRIURETIC PEPTIDE: B Natriuretic Peptide: 51.6 pg/mL (ref 0.0–100.0)

## 2021-07-13 MED ORDER — HYDRALAZINE HCL 20 MG/ML IJ SOLN
2.0000 mg | Freq: Once | INTRAMUSCULAR | Status: AC
  Administered 2021-07-13: 2 mg via INTRAVENOUS
  Filled 2021-07-13: qty 1

## 2021-07-13 MED ORDER — ALBUTEROL SULFATE (2.5 MG/3ML) 0.083% IN NEBU
10.0000 mg/h | INHALATION_SOLUTION | Freq: Once | RESPIRATORY_TRACT | Status: AC
  Administered 2021-07-13: 10 mg/h via RESPIRATORY_TRACT
  Filled 2021-07-13: qty 12

## 2021-07-13 MED ORDER — METHYLPREDNISOLONE SODIUM SUCC 125 MG IJ SOLR
125.0000 mg | Freq: Once | INTRAMUSCULAR | Status: AC
  Administered 2021-07-13: 125 mg via INTRAVENOUS
  Filled 2021-07-13: qty 2

## 2021-07-13 MED ORDER — MAGNESIUM SULFATE 2 GM/50ML IV SOLN
2.0000 g | Freq: Once | INTRAVENOUS | Status: AC
  Administered 2021-07-13: 2 g via INTRAVENOUS
  Filled 2021-07-13: qty 50

## 2021-07-13 NOTE — ED Triage Notes (Signed)
Pt reports with [facility] x 3 days.

## 2021-07-13 NOTE — ED Notes (Signed)
Patient has a blue top in the main lab °

## 2021-07-13 NOTE — ED Provider Notes (Signed)
10:48 PM-patient presenting for shortness of breath for 3 days, well progressively worsening and dyspnea on exertion.  He is coughing and producing sputum.  He denies fever, vomiting or dizziness.  He is not having chest pain focal weakness or paresthesia.  He is tachypneic and hypertensive.  He has a history of COPD and congestive heart failure.  Chest x-ray does not show heart failure or pneumonia.  We will start albuterol, Solu-Medrol and magnesium.  Screening exam, provider follow-up oncoming team.   Daleen Bo, MD 07/13/21 2254

## 2021-07-14 ENCOUNTER — Inpatient Hospital Stay (HOSPITAL_COMMUNITY): Payer: Medicaid Other

## 2021-07-14 ENCOUNTER — Encounter (HOSPITAL_COMMUNITY): Payer: Self-pay | Admitting: Emergency Medicine

## 2021-07-14 ENCOUNTER — Emergency Department (HOSPITAL_COMMUNITY): Payer: Medicaid Other

## 2021-07-14 DIAGNOSIS — F17210 Nicotine dependence, cigarettes, uncomplicated: Secondary | ICD-10-CM | POA: Diagnosis present

## 2021-07-14 DIAGNOSIS — J441 Chronic obstructive pulmonary disease with (acute) exacerbation: Secondary | ICD-10-CM | POA: Diagnosis present

## 2021-07-14 DIAGNOSIS — N1831 Chronic kidney disease, stage 3a: Secondary | ICD-10-CM | POA: Diagnosis present

## 2021-07-14 DIAGNOSIS — M7989 Other specified soft tissue disorders: Secondary | ICD-10-CM | POA: Diagnosis not present

## 2021-07-14 DIAGNOSIS — R0602 Shortness of breath: Secondary | ICD-10-CM | POA: Diagnosis not present

## 2021-07-14 DIAGNOSIS — B20 Human immunodeficiency virus [HIV] disease: Secondary | ICD-10-CM | POA: Diagnosis present

## 2021-07-14 DIAGNOSIS — I739 Peripheral vascular disease, unspecified: Secondary | ICD-10-CM | POA: Diagnosis present

## 2021-07-14 DIAGNOSIS — I5031 Acute diastolic (congestive) heart failure: Secondary | ICD-10-CM | POA: Diagnosis not present

## 2021-07-14 DIAGNOSIS — I169 Hypertensive crisis, unspecified: Secondary | ICD-10-CM

## 2021-07-14 DIAGNOSIS — I119 Hypertensive heart disease without heart failure: Secondary | ICD-10-CM | POA: Diagnosis not present

## 2021-07-14 DIAGNOSIS — I480 Paroxysmal atrial fibrillation: Secondary | ICD-10-CM | POA: Diagnosis present

## 2021-07-14 DIAGNOSIS — R778 Other specified abnormalities of plasma proteins: Secondary | ICD-10-CM

## 2021-07-14 DIAGNOSIS — I10 Essential (primary) hypertension: Secondary | ICD-10-CM

## 2021-07-14 DIAGNOSIS — Z8249 Family history of ischemic heart disease and other diseases of the circulatory system: Secondary | ICD-10-CM | POA: Diagnosis not present

## 2021-07-14 DIAGNOSIS — M109 Gout, unspecified: Secondary | ICD-10-CM | POA: Diagnosis present

## 2021-07-14 DIAGNOSIS — Z79899 Other long term (current) drug therapy: Secondary | ICD-10-CM | POA: Diagnosis not present

## 2021-07-14 DIAGNOSIS — N1832 Chronic kidney disease, stage 3b: Secondary | ICD-10-CM | POA: Diagnosis not present

## 2021-07-14 DIAGNOSIS — I5033 Acute on chronic diastolic (congestive) heart failure: Secondary | ICD-10-CM

## 2021-07-14 DIAGNOSIS — R0600 Dyspnea, unspecified: Secondary | ICD-10-CM | POA: Diagnosis not present

## 2021-07-14 DIAGNOSIS — C649 Malignant neoplasm of unspecified kidney, except renal pelvis: Secondary | ICD-10-CM | POA: Diagnosis present

## 2021-07-14 DIAGNOSIS — I130 Hypertensive heart and chronic kidney disease with heart failure and stage 1 through stage 4 chronic kidney disease, or unspecified chronic kidney disease: Secondary | ICD-10-CM | POA: Diagnosis present

## 2021-07-14 DIAGNOSIS — G4733 Obstructive sleep apnea (adult) (pediatric): Secondary | ICD-10-CM | POA: Diagnosis present

## 2021-07-14 DIAGNOSIS — R0609 Other forms of dyspnea: Secondary | ICD-10-CM | POA: Diagnosis not present

## 2021-07-14 DIAGNOSIS — Z20822 Contact with and (suspected) exposure to covid-19: Secondary | ICD-10-CM | POA: Diagnosis present

## 2021-07-14 LAB — COMPREHENSIVE METABOLIC PANEL
ALT: 14 U/L (ref 0–44)
AST: 23 U/L (ref 15–41)
Albumin: 3.5 g/dL (ref 3.5–5.0)
Alkaline Phosphatase: 64 U/L (ref 38–126)
Anion gap: 11 (ref 5–15)
BUN: 25 mg/dL — ABNORMAL HIGH (ref 8–23)
CO2: 19 mmol/L — ABNORMAL LOW (ref 22–32)
Calcium: 9 mg/dL (ref 8.9–10.3)
Chloride: 107 mmol/L (ref 98–111)
Creatinine, Ser: 2.03 mg/dL — ABNORMAL HIGH (ref 0.61–1.24)
GFR, Estimated: 36 mL/min — ABNORMAL LOW (ref >60)
Glucose, Bld: 256 mg/dL — ABNORMAL HIGH (ref 70–99)
Potassium: 4.4 mmol/L (ref 3.5–5.1)
Sodium: 137 mmol/L (ref 135–145)
Total Bilirubin: 0.7 mg/dL (ref 0.3–1.2)
Total Protein: 7.9 g/dL (ref 6.5–8.1)

## 2021-07-14 LAB — BLOOD GAS, VENOUS
Acid-base deficit: 1.8 mmol/L (ref 0.0–2.0)
Bicarbonate: 20.9 mmol/L (ref 20.0–28.0)
Drawn by: 5910
O2 Saturation: 96.8 %
Patient temperature: 37
pCO2, Ven: 30 mmHg — ABNORMAL LOW (ref 44–60)
pH, Ven: 7.45 — ABNORMAL HIGH (ref 7.25–7.43)
pO2, Ven: 77 mmHg — ABNORMAL HIGH (ref 32–45)

## 2021-07-14 LAB — MRSA NEXT GEN BY PCR, NASAL: MRSA by PCR Next Gen: NOT DETECTED

## 2021-07-14 LAB — PROTIME-INR
INR: 1.2 (ref 0.8–1.2)
Prothrombin Time: 14.6 seconds (ref 11.4–15.2)

## 2021-07-14 LAB — TROPONIN I (HIGH SENSITIVITY): Troponin I (High Sensitivity): 46 ng/L — ABNORMAL HIGH (ref <18)

## 2021-07-14 LAB — TSH: TSH: 0.511 u[IU]/mL (ref 0.350–4.500)

## 2021-07-14 LAB — APTT: aPTT: 33 seconds (ref 24–36)

## 2021-07-14 LAB — BRAIN NATRIURETIC PEPTIDE: B Natriuretic Peptide: 53.6 pg/mL (ref 0.0–100.0)

## 2021-07-14 LAB — HEMOGLOBIN A1C
Hgb A1c MFr Bld: 5.4 % (ref 4.8–5.6)
Mean Plasma Glucose: 108.28 mg/dL

## 2021-07-14 LAB — HEPARIN LEVEL (UNFRACTIONATED): Heparin Unfractionated: 0.52 IU/mL (ref 0.30–0.70)

## 2021-07-14 LAB — MAGNESIUM: Magnesium: 2.3 mg/dL (ref 1.7–2.4)

## 2021-07-14 MED ORDER — CHLORHEXIDINE GLUCONATE CLOTH 2 % EX PADS
6.0000 | MEDICATED_PAD | Freq: Every day | CUTANEOUS | Status: DC
  Administered 2021-07-14: 6 via TOPICAL

## 2021-07-14 MED ORDER — VALSARTAN-HYDROCHLOROTHIAZIDE 80-12.5 MG PO TABS: 1.0000 | ORAL_TABLET | Freq: Every day | ORAL | Status: DC

## 2021-07-14 MED ORDER — HYDRALAZINE HCL 25 MG PO TABS: 25.0000 mg | ORAL_TABLET | Freq: Three times a day (TID) | ORAL | Status: DC

## 2021-07-14 MED ORDER — ACETAMINOPHEN 325 MG PO TABS
650.0000 mg | ORAL_TABLET | Freq: Four times a day (QID) | ORAL | Status: DC | PRN
  Filled 2021-07-14: qty 2

## 2021-07-14 MED ORDER — LEVALBUTEROL HCL 1.25 MG/0.5ML IN NEBU
1.2500 mg | INHALATION_SOLUTION | Freq: Four times a day (QID) | RESPIRATORY_TRACT | Status: DC
  Administered 2021-07-14 (×3): 1.25 mg via RESPIRATORY_TRACT
  Filled 2021-07-14 (×2): qty 0.5

## 2021-07-14 MED ORDER — AMLODIPINE BESYLATE 5 MG PO TABS
2.5000 mg | ORAL_TABLET | Freq: Every day | ORAL | Status: DC
  Administered 2021-07-14 – 2021-07-16 (×3): 2.5 mg via ORAL
  Filled 2021-07-14 (×3): qty 1

## 2021-07-14 MED ORDER — ENOXAPARIN SODIUM 40 MG/0.4ML IJ SOSY: 40.0000 mg | PREFILLED_SYRINGE | INTRAMUSCULAR | Status: DC

## 2021-07-14 MED ORDER — ISOSORB DINITRATE-HYDRALAZINE 20-37.5 MG PO TABS
1.0000 | ORAL_TABLET | Freq: Three times a day (TID) | ORAL | Status: DC
  Administered 2021-07-14 – 2021-07-17 (×8): 1 via ORAL
  Filled 2021-07-14 (×11): qty 1

## 2021-07-14 MED ORDER — NALOXONE HCL 0.4 MG/ML IJ SOLN: 0.4000 mg | INTRAMUSCULAR | Status: DC | PRN

## 2021-07-14 MED ORDER — HEPARIN BOLUS VIA INFUSION
4000.0000 [IU] | Freq: Once | INTRAVENOUS | Status: AC
  Administered 2021-07-14: 4000 [IU] via INTRAVENOUS
  Filled 2021-07-14: qty 4000

## 2021-07-14 MED ORDER — ALBUTEROL SULFATE (2.5 MG/3ML) 0.083% IN NEBU
2.5000 mg | INHALATION_SOLUTION | RESPIRATORY_TRACT | Status: DC | PRN
  Administered 2021-07-15: 2.5 mg via RESPIRATORY_TRACT
  Filled 2021-07-14: qty 3

## 2021-07-14 MED ORDER — FUROSEMIDE 40 MG PO TABS
40.0000 mg | ORAL_TABLET | Freq: Two times a day (BID) | ORAL | Status: DC
  Administered 2021-07-15: 40 mg via ORAL
  Filled 2021-07-14: qty 1

## 2021-07-14 MED ORDER — ACETAMINOPHEN 650 MG RE SUPP: 650.0000 mg | Freq: Four times a day (QID) | RECTAL | Status: DC | PRN

## 2021-07-14 MED ORDER — AMLODIPINE BESYLATE 5 MG PO TABS: 5.0000 mg | ORAL_TABLET | Freq: Every day | ORAL | Status: DC

## 2021-07-14 MED ORDER — LEVALBUTEROL HCL 1.25 MG/0.5ML IN NEBU: 1.2500 mg | INHALATION_SOLUTION | Freq: Four times a day (QID) | RESPIRATORY_TRACT | Status: DC

## 2021-07-14 MED ORDER — ASPIRIN 81 MG PO TBEC
81.0000 mg | DELAYED_RELEASE_TABLET | Freq: Every day | ORAL | Status: DC
  Administered 2021-07-14 – 2021-07-17 (×4): 81 mg via ORAL
  Filled 2021-07-14 (×4): qty 1

## 2021-07-14 MED ORDER — LEVALBUTEROL HCL 1.25 MG/0.5ML IN NEBU: 1.2500 mg | INHALATION_SOLUTION | RESPIRATORY_TRACT | Status: DC | PRN

## 2021-07-14 MED ORDER — SODIUM CHLORIDE 0.9% FLUSH: 3.0000 mL | INTRAVENOUS | Status: DC | PRN

## 2021-07-14 MED ORDER — ALBUTEROL SULFATE (2.5 MG/3ML) 0.083% IN NEBU
5.0000 mg | INHALATION_SOLUTION | Freq: Once | RESPIRATORY_TRACT | Status: AC
  Administered 2021-07-14: 5 mg via RESPIRATORY_TRACT
  Filled 2021-07-14: qty 6

## 2021-07-14 MED ORDER — TECHNETIUM TO 99M ALBUMIN AGGREGATED
4.0300 | Freq: Once | INTRAVENOUS | Status: AC | PRN
  Administered 2021-07-14: 4.03 via INTRAVENOUS

## 2021-07-14 MED ORDER — CARVEDILOL 6.25 MG PO TABS
6.2500 mg | ORAL_TABLET | Freq: Two times a day (BID) | ORAL | Status: DC
  Administered 2021-07-14 – 2021-07-17 (×6): 6.25 mg via ORAL
  Filled 2021-07-14 (×7): qty 1

## 2021-07-14 MED ORDER — DOLUTEGRAVIR-LAMIVUDINE 50-300 MG PO TABS: 1.0000 | ORAL_TABLET | Freq: Every day | ORAL | Status: DC

## 2021-07-14 MED ORDER — SODIUM CHLORIDE 0.9 % IV SOLN: 250.0000 mL | INTRAVENOUS | Status: DC | PRN

## 2021-07-14 MED ORDER — FENTANYL CITRATE (PF) 100 MCG/2ML IJ SOLN: 50.0000 ug | INTRAMUSCULAR | Status: DC | PRN

## 2021-07-14 MED ORDER — IPRATROPIUM-ALBUTEROL 0.5-2.5 (3) MG/3ML IN SOLN
3.0000 mL | Freq: Two times a day (BID) | RESPIRATORY_TRACT | Status: DC
Start: 2021-07-15 — End: 2021-07-16
  Administered 2021-07-15 – 2021-07-16 (×3): 3 mL via RESPIRATORY_TRACT
  Filled 2021-07-14 (×3): qty 3

## 2021-07-14 MED ORDER — ASPIRIN 81 MG PO CHEW
324.0000 mg | CHEWABLE_TABLET | Freq: Once | ORAL | Status: AC
  Administered 2021-07-14: 324 mg via ORAL
  Filled 2021-07-14: qty 4

## 2021-07-14 MED ORDER — ORAL CARE MOUTH RINSE
15.0000 mL | Freq: Two times a day (BID) | OROMUCOSAL | Status: DC
  Administered 2021-07-14 – 2021-07-16 (×5): 15 mL via OROMUCOSAL

## 2021-07-14 MED ORDER — HYDRALAZINE HCL 25 MG PO TABS
25.0000 mg | ORAL_TABLET | Freq: Three times a day (TID) | ORAL | Status: DC | PRN
  Administered 2021-07-14: 25 mg via ORAL
  Filled 2021-07-14: qty 1

## 2021-07-14 MED ORDER — IPRATROPIUM BROMIDE 0.02 % IN SOLN: 0.5000 mg | Freq: Four times a day (QID) | RESPIRATORY_TRACT | Status: DC

## 2021-07-14 MED ORDER — HYDRALAZINE HCL 20 MG/ML IJ SOLN
10.0000 mg | INTRAMUSCULAR | Status: DC | PRN
  Administered 2021-07-14: 10 mg via INTRAVENOUS
  Filled 2021-07-14: qty 1

## 2021-07-14 MED ORDER — IRBESARTAN 150 MG PO TABS
300.0000 mg | ORAL_TABLET | Freq: Every day | ORAL | Status: DC
  Administered 2021-07-14 – 2021-07-17 (×4): 300 mg via ORAL
  Filled 2021-07-14 (×3): qty 2
  Filled 2021-07-14: qty 1

## 2021-07-14 MED ORDER — FENTANYL CITRATE PF 50 MCG/ML IJ SOSY: 50.0000 ug | PREFILLED_SYRINGE | INTRAMUSCULAR | Status: DC | PRN

## 2021-07-14 MED ORDER — DOLUTEGRAVIR SODIUM 50 MG PO TABS
50.0000 mg | ORAL_TABLET | Freq: Every day | ORAL | Status: DC
  Administered 2021-07-14 – 2021-07-15 (×2): 50 mg via ORAL
  Filled 2021-07-14 (×3): qty 1

## 2021-07-14 MED ORDER — ONDANSETRON HCL 4 MG/2ML IJ SOLN: 4.0000 mg | Freq: Four times a day (QID) | INTRAMUSCULAR | Status: DC | PRN

## 2021-07-14 MED ORDER — LAMIVUDINE 150 MG PO TABS
300.0000 mg | ORAL_TABLET | Freq: Every day | ORAL | Status: DC
  Administered 2021-07-14 – 2021-07-15 (×2): 300 mg via ORAL
  Filled 2021-07-14 (×3): qty 2

## 2021-07-14 MED ORDER — ACETAMINOPHEN 325 MG PO TABS: 650.0000 mg | ORAL_TABLET | ORAL | Status: DC | PRN

## 2021-07-14 MED ORDER — ENSURE ENLIVE PO LIQD
237.0000 mL | Freq: Two times a day (BID) | ORAL | Status: DC
  Administered 2021-07-14: 237 mL via ORAL

## 2021-07-14 MED ORDER — ISOSORB DINITRATE-HYDRALAZINE 20-37.5 MG PO TABS: 1.0000 | ORAL_TABLET | Freq: Three times a day (TID) | ORAL | Status: DC

## 2021-07-14 MED ORDER — HEPARIN (PORCINE) 25000 UT/250ML-% IV SOLN: 12.0000 [IU]/kg/h | INTRAVENOUS | Status: DC

## 2021-07-14 MED ORDER — HEPARIN (PORCINE) 25000 UT/250ML-% IV SOLN
1200.0000 [IU]/h | INTRAVENOUS | Status: DC
  Administered 2021-07-14: 1200 [IU]/h via INTRAVENOUS
  Filled 2021-07-14: qty 250

## 2021-07-14 MED ORDER — SODIUM CHLORIDE 0.9% FLUSH
3.0000 mL | Freq: Two times a day (BID) | INTRAVENOUS | Status: DC
  Administered 2021-07-14 – 2021-07-17 (×7): 3 mL via INTRAVENOUS

## 2021-07-14 MED ORDER — MORPHINE SULFATE 15 MG PO TABS
15.0000 mg | ORAL_TABLET | Freq: Four times a day (QID) | ORAL | Status: DC | PRN
Start: 2021-07-14 — End: 2021-07-14

## 2021-07-14 MED ORDER — HYDROCHLOROTHIAZIDE 12.5 MG PO TABS
12.5000 mg | ORAL_TABLET | Freq: Every day | ORAL | Status: DC
  Administered 2021-07-14: 12.5 mg via ORAL
  Filled 2021-07-14: qty 1

## 2021-07-14 MED ORDER — FUROSEMIDE 10 MG/ML IJ SOLN
40.0000 mg | Freq: Two times a day (BID) | INTRAMUSCULAR | Status: DC
  Administered 2021-07-14: 40 mg via INTRAVENOUS
  Filled 2021-07-14: qty 4

## 2021-07-14 MED ORDER — HYDRALAZINE HCL 20 MG/ML IJ SOLN
5.0000 mg | INTRAMUSCULAR | Status: AC
  Administered 2021-07-14: 5 mg via INTRAVENOUS
  Filled 2021-07-14: qty 1

## 2021-07-14 MED ORDER — ESCITALOPRAM OXALATE 20 MG PO TABS
20.0000 mg | ORAL_TABLET | Freq: Every day | ORAL | Status: DC
  Administered 2021-07-14 – 2021-07-17 (×4): 20 mg via ORAL
  Filled 2021-07-14 (×5): qty 1

## 2021-07-14 MED ORDER — IRBESARTAN 75 MG PO TABS
75.0000 mg | ORAL_TABLET | Freq: Every day | ORAL | Status: DC
  Filled 2021-07-14: qty 1

## 2021-07-14 MED ORDER — IPRATROPIUM BROMIDE 0.02 % IN SOLN
0.5000 mg | Freq: Four times a day (QID) | RESPIRATORY_TRACT | Status: DC
  Administered 2021-07-14 (×3): 0.5 mg via RESPIRATORY_TRACT
  Filled 2021-07-14 (×2): qty 2.5

## 2021-07-14 NOTE — Progress Notes (Signed)
[place] for heparin Indication: pulmonary embolus  No Known Allergies  Patient Measurements: Height: 6' (182.9 cm) Weight: 69.1 kg (152 lb 5.4 oz) IBW/kg (Calculated) : 77.6 Heparin Dosing Weight: 74.8kg  Vital Signs: Temp: 97.8 F (36.6 C) (06/01 0445) Temp Source: Oral (06/01 0445) BP: 234/133 (06/01 0800) Pulse Rate: 72 (06/01 0800)  Labs: Recent Labs    07/13/21 2205 07/13/21 2315 07/14/21 0110 07/14/21 0245 07/14/21 0703 07/14/21 0841  HGB 10.5*  --   --   --   --   --   HCT 30.3*  --   --   --   --   --   PLT 393  --   --   --   --   --   APTT  --   --   --  33  --   --   LABPROT  --   --   --  14.6  --   --   INR  --   --   --  1.2  --   --   HEPARINUNFRC  --   --   --   --   --  0.52  CREATININE 2.10*  --   --   --  2.03*  --   TROPONINIHS  --  50* 46*  --   --   --      Estimated Creatinine Clearance: 36.4 mL/min (A) (by C-G formula based on SCr of 2.03 mg/dL (H)).   Medical History: Past Medical History:  Diagnosis Date   AIDS (acquired immune deficiency syndrome) ([place]) 08/17/2016   Chronic diastolic CHF (congestive heart failure), NYHA class 3 (HCC) 01/2016   Chronic lower back pain    CKD (chronic kidney disease), stage III (HCC)    COPD (chronic obstructive pulmonary disease) (HCC)    Gout    "forearms, hands, ankles, feet" (06/05/2016)   Headache    "weekly" (06/05/2016)   Heart murmur    Hypertension    Hypertensive crisis 08/15/2017   OSA on CPAP    PAD (peripheral artery disease) (HCC)    PAF (paroxysmal atrial fibrillation) ([place]) 01/2016   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  Pharmacy consulted to dose heparin drip for PE.  No prior AC noted  Today, 07/14/21: 08:41 heparin level therapeutic at 0.52 with heparin infusing at 1200 units/hr No bleeding issues per  nurse  Goal of Therapy:  Heparin level 0.3-0.7 units/ml Monitor platelets by anticoagulation protocol: Yes   Plan:  Continue heparin infusion at 1200 units/hr Confirmatory Heparin level in 6 hours Monitor daily heparin level, CBC, signs/symptoms of bleeding   Royetta Asal, PharmD, [place] Please utilize [employer] for appropriate phone number to reach the unit pharmacist ([place]) 07/14/2021 9:55 AM

## 2021-07-14 NOTE — ED Provider Notes (Addendum)
[facility] DEPT Provider Note   CSN: [id number] Arrival date & time: 07/13/21  2140     History  Chief Complaint  Patient presents with   Shortness of Breath    Kevin [name] is a 66 y.o. male.  The history is provided by the patient.  Shortness of Breath Severity:  Moderate Onset quality:  Gradual Duration:  3 days Timing:  Constant Progression:  Worsening Chronicity:  Recurrent Context: smoke exposure   Relieved by:  Nothing Worsened by:  Nothing Ineffective treatments: nebulizer. Associated symptoms: chest pain, cough and wheezing   Associated symptoms: no abdominal pain and no fever   Risk factors: hx of cancer       Home Medications Prior to Admission medications   Medication Sig Start Date End Date Taking? Authorizing Provider  albuterol (PROVENTIL) (2.5 MG/3ML) 0.083% nebulizer solution Take 3 mLs (2.5 mg total) by nebulization every 4 (four) hours as needed for wheezing or shortness of breath. Patient taking differently: Take 5 mg by nebulization daily as needed for wheezing or shortness of breath. 05/13/21  Yes [name] Henle, MD  albuterol (VENTOLIN HFA) 108 (90 Base) MCG/ACT inhaler Inhale 2 puffs into the lungs every 6 (six) hours as needed for wheezing or shortness of breath. Patient taking differently: Inhale 2 puffs into the lungs 2 (two) times daily as needed for wheezing or shortness of breath. 04/19/21  Yes Raspet, Erin K, PA-C  dolutegravir-lamiVUDine (DOVATO) 50-300 MG tablet Take 1 tablet by mouth daily. 06/15/21  Yes [name], [name], MD  escitalopram (LEXAPRO) 10 MG tablet Take 20 mg by mouth daily. 06/16/21  Yes [provider]  Multiple Vitamin (MULTIVITAMIN) tablet Take 1 tablet by mouth daily.   Yes [provider]  valsartan-hydrochlorothiazide (DIOVAN-HCT) 80-12.5 MG tablet Take 1 tablet by mouth daily. 06/09/21  Yes [provider]  morphine (MSIR) 15 MG tablet Take 1 tablet (15 mg  total) by mouth every 6 (six) hours as needed for severe pain. Patient not taking: Reported on 07/13/2021 07/10/21   Varney Biles, MD      Allergies    Patient has no known allergies.    Review of Systems   Review of Systems  Constitutional:  Negative for fever.  HENT:  Negative for facial swelling.   Eyes:  Negative for redness.  Respiratory:  Positive for cough, shortness of breath and wheezing.   Cardiovascular:  Positive for chest pain.  Gastrointestinal:  Negative for abdominal pain.  All other systems reviewed and are negative.  Physical Exam Updated Vital Signs BP (!) 225/120   Pulse 76   Temp 98.4 F (36.9 C) (Oral)   Resp 19   Ht 6' (1.829 m)   Wt 74.8 kg   SpO2 99%   BMI 22.38 kg/m  Physical Exam Vitals and nursing note reviewed.  Constitutional:      General: He is not in acute distress.    Appearance: Normal appearance. He is well-developed. He is not diaphoretic.  HENT:     Head: Normocephalic and atraumatic.     Nose: Nose normal.     Mouth/Throat:     Mouth: Mucous membranes are moist.  Eyes:     General:        Right eye: Discharge present.     Pupils: Pupils are equal, round, and reactive to light.     Comments: Normal appearance  Cardiovascular:     Rate and Rhythm: Normal rate and regular rhythm.  Pulses: Normal pulses.     Heart sounds: Normal heart sounds.  Pulmonary:     Effort: Pulmonary effort is normal. No respiratory distress.     Breath sounds: Wheezing present.  Abdominal:     General: Abdomen is flat. Bowel sounds are normal. There is no distension.     Palpations: Abdomen is soft. There is no mass.     Tenderness: There is no abdominal tenderness. There is no guarding or rebound.  Genitourinary:    Comments: No CVA tenderness Musculoskeletal:        General: Normal range of motion.     Cervical back: Normal range of motion.  Skin:    General: Skin is warm and dry.     Capillary Refill: Capillary refill takes less than 2  seconds.     Findings: No rash.  Neurological:     General: No focal deficit present.     Mental Status: He is alert and oriented to person, place, and time.     Deep Tendon Reflexes: Reflexes normal.  Psychiatric:        Mood and Affect: Mood normal.        Behavior: Behavior normal.    ED Results / Procedures / Treatments   Labs (all labs ordered are listed, but only abnormal results are displayed) Results for orders placed or performed during the hospital encounter of 07/13/21  SARS Coronavirus 2 by RT PCR (hospital order, performed in [place] hospital lab) *cepheid single result test* Anterior Nasal Swab   Specimen: Anterior Nasal Swab  Result Value Ref Range   SARS Coronavirus 2 by RT PCR NEGATIVE NEGATIVE  Basic metabolic panel  Result Value Ref Range   Sodium 136 135 - 145 mmol/L   Potassium 3.8 3.5 - 5.1 mmol/L   Chloride 109 98 - 111 mmol/L   CO2 21 (L) 22 - 32 mmol/L   Glucose, Bld 113 (H) 70 - 99 mg/dL   BUN 25 (H) 8 - 23 mg/dL   Creatinine, Ser 2.10 (H) 0.61 - 1.24 mg/dL   Calcium 8.6 (L) 8.9 - 10.3 mg/dL   GFR, Estimated 35 (L) >60 mL/min   Anion gap 6 5 - 15  CBC  Result Value Ref Range   WBC 11.8 (H) 4.0 - 10.5 K/uL   RBC 3.22 (L) 4.22 - 5.81 MIL/uL   Hemoglobin 10.5 (L) 13.0 - 17.0 g/dL   HCT 30.3 (L) 39.0 - 52.0 %   MCV 94.1 80.0 - 100.0 fL   MCH 32.6 26.0 - 34.0 pg   MCHC 34.7 30.0 - 36.0 g/dL   RDW 15.8 (H) 11.5 - 15.5 %   Platelets 393 150 - 400 K/uL   nRBC 0.0 0.0 - 0.2 %  Brain natriuretic peptide  Result Value Ref Range   B Natriuretic Peptide 51.6 0.0 - 100.0 pg/mL  APTT  Result Value Ref Range   aPTT 33 24 - 36 seconds  Protime-INR  Result Value Ref Range   Prothrombin Time 14.6 11.4 - 15.2 seconds   INR 1.2 0.8 - 1.2  Troponin I (High Sensitivity)  Result Value Ref Range   Troponin I (High Sensitivity) 50 (H) <18 ng/L  Troponin I (High Sensitivity)  Result Value Ref Range   Troponin I (High Sensitivity) 46 (H) <18 ng/L   DG  Chest 1 View  Result Date: 06/29/2021 CLINICAL DATA:  Preop chest x-ray EXAM: CHEST  1 VIEW COMPARISON:  04/19/2021 FINDINGS: The heart size and mediastinal contours are  within normal limits. Both lungs are clear. The visualized skeletal structures are unremarkable. IMPRESSION: No active disease. Electronically Signed   By: Franchot Gallo M.D.   On: 06/29/2021 09:57   DG Chest 2 View  Result Date: 07/13/2021 CLINICAL DATA:  Shortness of breath EXAM: CHEST - 2 VIEW COMPARISON:  06/29/2021 FINDINGS: The heart size and mediastinal contours are within normal limits. Both lungs are clear. The visualized skeletal structures are unremarkable. IMPRESSION: Negative. Electronically Signed   By: Rolm Baptise M.D.   On: 07/13/2021 22:06   US Abdomen Complete  Result Date: 07/10/2021 CLINICAL DATA:  Right upper quadrant abdominal pain and right flank pain for 5 days. EXAM: ABDOMEN ULTRASOUND COMPLETE COMPARISON:  Prior CTs, most recent dated 07/09/2021. FINDINGS: Gallbladder: No gallstones or wall thickening visualized. No sonographic Murphy sign noted by sonographer. Common bile duct: Diameter: 3 mm Liver: No focal lesion identified. Within normal limits in parenchymal echogenicity. Portal vein is patent on color Doppler imaging with normal direction of blood flow towards the liver. IVC: No abnormality visualized. Pancreas: Visualized portion unremarkable. Spleen: Partly obscured but overall normal in size and appearance. Right Kidney: Length: 10.1 cm. Normal overall parenchymal echogenicity. Vague hypoechoic area in the renal sinus region of the midpole, 2.1 x 1.6 x 2.0 cm. Tiny cortical cystic lesion, 5 mm, midpole. No other masses, no stones and no hydronephrosis. Left Kidney: Length: 9.8 cm. Normal parenchymal echogenicity. Simple cyst arises from the lower pole, 9 mm. No other masses, no stones and no hydronephrosis. Abdominal aorta: No aneurysm visualized. Other findings: None. IMPRESSION: 1. No acute findings.   Normal gallbladder.  No bile duct dilation. 2. Hypoechoic area within the midpole the right kidney, corresponding to the hypoechoic area noted on the previous day's CT. This also corresponds to a lesion noted on MRI dated 05/14/2021 that subsequently underwent CT-guided ablation. 3. 5 mm cystic lesion in the right kidney, too small to fully characterize but likely a simple cyst, and corresponding to a simple cyst present on the prior MRI. 4. 9 mm simple cyst arising from the left kidney. Electronically Signed   By: Lajean Manes M.D.   On: 07/10/2021 11:02   CT GUIDE TISSUE ABLATION  Addendum Date: 06/30/2021   ADDENDUM REPORT: 06/30/2021 10:36 ADDENDUM: RADIATION DOSE REDUCTION: This exam was performed according to the departmental dose-optimization program which includes automated exposure control, adjustment of the mA and/or kV according to patient size and/or use of iterative reconstruction technique. Electronically Signed   By: Markus Daft M.D.   On: 06/30/2021 10:36   Result Date: 06/30/2021 INDICATION: 66 year old with a suspicious right renal lesion. Scheduled for image guided cryoablation and biopsy. EXAM: CT AND ULTRASOUND GUIDED CRYOABLATION OF RIGHT RENAL LESION IMAGE GUIDED BIOPSY OF RIGHT RENAL LESION COMPARISON:  None Available. MEDICATIONS: Rocephin 1 g; The antibiotic was administered in an appropriate time interval prior to needle puncture of the skin. ANESTHESIA/SEDATION: General - as administered by the Anesthesia department FLUOROSCOPY: None COMPLICATIONS: None immediate. TECHNIQUE: Informed written consent was obtained from the patient after a thorough discussion of the procedural risks, benefits and alternatives. All questions were addressed. A timeout was performed prior to the initiation of the procedure. Patient was intubated by anesthesia. Patient was placed prone. Right renal lesion was identified with ultrasound. CT images of the abdomen were also obtained. The right renal lesion  was identified with CT. Right flank was prepped with chlorhexidine and sterile field was created. Maximal barrier sterile technique was utilized including caps,  mask, sterile gowns, sterile gloves, sterile drape, hand hygiene and skin antiseptic. Using ultrasound guidance, 2 Cryocare PCS-17 needles were directed into the right kidney. Needles were repositioned multiple times and needles were directed along the long axis of the lesion using ultrasound guidance. One lesion was placed deeper into the kidney and the other was placed more superficial near the periphery. Needle positioning was confirmed with CT. Subsequently, hydro dissection was performed using at dilute contrast through a [place] needle. Approximately 400 mL of hydro dissection was performed around the right kidney. Follow up CT images were obtained to evaluate the hydro dissection. Using ultrasound guidance, a 17 gauge coaxial needle was directed into the lesion and 2 core biopsies were obtained with an 18 gauge device. Specimens placed in formalin. Cryoablation was performed with 9 minute freeze, 6 minute thaw and a second 9 minute freeze. Both needles removed after final 6 minute thaw. Final CT images were obtained. Bandages were placed. Patient was taken to the PACU for recovery. FINDINGS: Ultrasound and CT demonstrated a slightly exophytic lesion along the posterior right kidney. Lesion measured approximately 1.4 cm on CT. This lesion was hypoechoic on ultrasound. Due to the overlying rib, the lesion was best accessed using ultrasound guidance with the lesion elongated and needle was directed from a slightly caudal to cranial approach. Needles were placed in near parallel configuration. Hydro dissection was performed around the right kidney to protect the intercostal space. Ice balls were confirmed on CT images during the freeze. Ice ball appeared to be encompassing the suspicious lesion. There is probably a small amount of ice ball  extending into the posterior intercostal space as well. No immediate bleeding or hematoma formations. IMPRESSION: Image guided biopsy and cryoablation of right renal lesion. Electronically Signed: By: Markus Daft M.D. On: 06/30/2021 08:02   CT BIOPSY  Addendum Date: 06/30/2021   ADDENDUM REPORT: 06/30/2021 10:36 ADDENDUM: RADIATION DOSE REDUCTION: This exam was performed according to the departmental dose-optimization program which includes automated exposure control, adjustment of the mA and/or kV according to patient size and/or use of iterative reconstruction technique. Electronically Signed   By: Markus Daft M.D.   On: 06/30/2021 10:36   Result Date: 06/30/2021 INDICATION: 66 year old with a suspicious right renal lesion. Scheduled for image guided cryoablation and biopsy. EXAM: CT AND ULTRASOUND GUIDED CRYOABLATION OF RIGHT RENAL LESION IMAGE GUIDED BIOPSY OF RIGHT RENAL LESION COMPARISON:  None Available. MEDICATIONS: Rocephin 1 g; The antibiotic was administered in an appropriate time interval prior to needle puncture of the skin. ANESTHESIA/SEDATION: General - as administered by the Anesthesia department FLUOROSCOPY: None COMPLICATIONS: None immediate. TECHNIQUE: Informed written consent was obtained from the patient after a thorough discussion of the procedural risks, benefits and alternatives. All questions were addressed. A timeout was performed prior to the initiation of the procedure. Patient was intubated by anesthesia. Patient was placed prone. Right renal lesion was identified with ultrasound. CT images of the abdomen were also obtained. The right renal lesion was identified with CT. Right flank was prepped with chlorhexidine and sterile field was created. Maximal barrier sterile technique was utilized including caps, mask, sterile gowns, sterile gloves, sterile drape, hand hygiene and skin antiseptic. Using ultrasound guidance, 2 Cryocare PCS-17 needles were directed into the right kidney.  Needles were repositioned multiple times and needles were directed along the long axis of the lesion using ultrasound guidance. One lesion was placed deeper into the kidney and the other was placed more superficial near the periphery. Needle  positioning was confirmed with CT. Subsequently, hydro dissection was performed using at dilute contrast through a [place] needle. Approximately 400 mL of hydro dissection was performed around the right kidney. Follow up CT images were obtained to evaluate the hydro dissection. Using ultrasound guidance, a 17 gauge coaxial needle was directed into the lesion and 2 core biopsies were obtained with an 18 gauge device. Specimens placed in formalin. Cryoablation was performed with 9 minute freeze, 6 minute thaw and a second 9 minute freeze. Both needles removed after final 6 minute thaw. Final CT images were obtained. Bandages were placed. Patient was taken to the PACU for recovery. FINDINGS: Ultrasound and CT demonstrated a slightly exophytic lesion along the posterior right kidney. Lesion measured approximately 1.4 cm on CT. This lesion was hypoechoic on ultrasound. Due to the overlying rib, the lesion was best accessed using ultrasound guidance with the lesion elongated and needle was directed from a slightly caudal to cranial approach. Needles were placed in near parallel configuration. Hydro dissection was performed around the right kidney to protect the intercostal space. Ice balls were confirmed on CT images during the freeze. Ice ball appeared to be encompassing the suspicious lesion. There is probably a small amount of ice ball extending into the posterior intercostal space as well. No immediate bleeding or hematoma formations. IMPRESSION: Image guided biopsy and cryoablation of right renal lesion. Electronically Signed: By: Markus Daft M.D. On: 06/30/2021 08:02   CT Renal Stone Study  Result Date: 07/14/2021 CLINICAL DATA:  Right flank pain EXAM: CT ABDOMEN AND  PELVIS WITHOUT CONTRAST TECHNIQUE: Multidetector CT imaging of the abdomen and pelvis was performed following the standard protocol without IV contrast. RADIATION DOSE REDUCTION: This exam was performed according to the departmental dose-optimization program which includes automated exposure control, adjustment of the mA and/or kV according to patient size and/or use of iterative reconstruction technique. COMPARISON:  07/09/2021, MRI 05/14/2021 FINDINGS: Lower chest: No acute abnormality Hepatobiliary: No focal hepatic abnormality. Gallbladder unremarkable. Pancreas: No focal abnormality or ductal dilatation. Spleen: No focal abnormality.  Normal size. Adrenals/Urinary Tract: Low-density lesion again noted posteriorly in the midpole of the right kidney, unchanged since recent study. This has enlarged since prior MRI from 05/14/2021. This recently has undergone biopsy and cryoablation, likely the cause of the increased size. No visible surrounding hematoma. No renal or ureteral stones. No hydronephrosis. Adrenal glands and urinary bladder unremarkable. Stomach/Bowel: Normal appendix. Stomach, large and small bowel grossly unremarkable. Vascular/Lymphatic: No evidence of aneurysm or adenopathy. Scattered aortic calcifications. Reproductive: No visible focal abnormality. Other: No free fluid or free air. Musculoskeletal: No acute bony abnormality. IMPRESSION: 3.8 cm low-density lesion posteriorly in the midpole of the right kidney which cannot be characterized on this noncontrast study. This appears significantly larger when compared to prior MRI. This has recently undergone biopsy and cryoablation, likely the cause of the apparent increased size of the lesion. No visible complicating feature. No renal or ureteral stones.  No hydronephrosis. Aortic atherosclerosis. Electronically Signed   By: Rolm Baptise M.D.   On: 07/14/2021 00:44   CT RENAL STONE STUDY  Result Date: 07/09/2021 CLINICAL DATA:  Flank pain, kidney  stone suspected. EXAM: CT ABDOMEN AND PELVIS WITHOUT CONTRAST TECHNIQUE: Multidetector CT imaging of the abdomen and pelvis was performed following the standard protocol without IV contrast. RADIATION DOSE REDUCTION: This exam was performed according to the departmental dose-optimization program which includes automated exposure control, adjustment of the mA and/or kV according to patient size and/or use  of iterative reconstruction technique. COMPARISON:  CT examination dated April 15, 2021 FINDINGS: Lower chest: No acute abnormality. Hepatobiliary: No focal liver abnormality is seen. No gallstones, gallbladder wall thickening, or biliary dilatation. Pancreas: Unremarkable. No pancreatic ductal dilatation or surrounding inflammatory changes. Spleen: Normal in size without focal abnormality. Adrenals/Urinary Tract: Adrenal glands are unremarkable. Punctate nonobstructing calculus in the left renal pelvis. No evidence of hydronephrosis or ureteral calculus. Hypodense complex cystic structure in the interpolar region of the right kidney, not well visualized on this noncontrast examination, likely proteinaceous cyst. Bladder is unremarkable. Stomach/Bowel: Stomach is within normal limits. Appendix appears normal. No evidence of bowel wall thickening, distention, or inflammatory changes. Vascular/Lymphatic: Aortic atherosclerosis. No enlarged abdominal or pelvic lymph nodes. Reproductive: Prostate is unremarkable. Other: No abdominal wall hernia or abnormality. No abdominopelvic ascites. Musculoskeletal: Mild degenerative disease of the lumbar spine. No acute osseous abnormality. IMPRESSION: 1. Punctate calculus in the interpolar region of the left kidney without evidence of hydronephrosis or ureteral calculus. 2. No evidence of right nephrolithiasis or hydronephrosis. Complex cyst in the posterior aspect of the inter polar region, unchanged, evaluation is limited on this noncontrast enhanced examination. 3. Bowel loops  are normal in caliber. Normal appendix. No evidence of colitis or diverticulitis. 4.  No CT evidence of acute abdominal/pelvic process. Electronically Signed   By: Keane Police D.O.   On: 07/09/2021 12:46     EKG EKG Interpretation  Date/Time:  Wednesday Jul 13 2021 21:49:12 EDT Ventricular Rate:  82 PR Interval:  164 QRS Duration: 86 QT Interval:  356 QTC Calculation: 416 R Axis:   77 Text Interpretation: Sinus rhythm LVH with secondary repolarization abnormality Anterior Q waves, possibly due to LVH Confirmed by Randal Buba, Shayne Deerman ([id number]) on 07/13/2021 10:59:43 PM  Radiology DG Chest 2 View  Result Date: 07/13/2021 CLINICAL DATA:  Shortness of breath EXAM: CHEST - 2 VIEW COMPARISON:  06/29/2021 FINDINGS: The heart size and mediastinal contours are within normal limits. Both lungs are clear. The visualized skeletal structures are unremarkable. IMPRESSION: Negative. Electronically Signed   By: Rolm Baptise M.D.   On: 07/13/2021 22:06   CT Renal Stone Study  Result Date: 07/14/2021 CLINICAL DATA:  Right flank pain EXAM: CT ABDOMEN AND PELVIS WITHOUT CONTRAST TECHNIQUE: Multidetector CT imaging of the abdomen and pelvis was performed following the standard protocol without IV contrast. RADIATION DOSE REDUCTION: This exam was performed according to the departmental dose-optimization program which includes automated exposure control, adjustment of the mA and/or kV according to patient size and/or use of iterative reconstruction technique. COMPARISON:  07/09/2021, MRI 05/14/2021 FINDINGS: Lower chest: No acute abnormality Hepatobiliary: No focal hepatic abnormality. Gallbladder unremarkable. Pancreas: No focal abnormality or ductal dilatation. Spleen: No focal abnormality.  Normal size. Adrenals/Urinary Tract: Low-density lesion again noted posteriorly in the midpole of the right kidney, unchanged since recent study. This has enlarged since prior MRI from 05/14/2021. This recently has undergone biopsy and  cryoablation, likely the cause of the increased size. No visible surrounding hematoma. No renal or ureteral stones. No hydronephrosis. Adrenal glands and urinary bladder unremarkable. Stomach/Bowel: Normal appendix. Stomach, large and small bowel grossly unremarkable. Vascular/Lymphatic: No evidence of aneurysm or adenopathy. Scattered aortic calcifications. Reproductive: No visible focal abnormality. Other: No free fluid or free air. Musculoskeletal: No acute bony abnormality. IMPRESSION: 3.8 cm low-density lesion posteriorly in the midpole of the right kidney which cannot be characterized on this noncontrast study. This appears significantly larger when compared to prior MRI. This has recently undergone biopsy and  cryoablation, likely the cause of the apparent increased size of the lesion. No visible complicating feature. No renal or ureteral stones.  No hydronephrosis. Aortic atherosclerosis. Electronically Signed   By: Rolm Baptise M.D.   On: 07/14/2021 00:44    Procedures Procedures    Medications Ordered in ED Medications  heparin ADULT infusion 100 units/mL (25000 units/241m) (1,200 Units/hr Intravenous New Bag/Given 07/14/21 0246)  acetaminophen (TYLENOL) tablet 650 mg (has no administration in time range)    Or  acetaminophen (TYLENOL) suppository 650 mg (has no administration in time range)  levalbuterol (XOPENEX) nebulizer solution 1.25 mg (has no administration in time range)  ipratropium (ATROVENT) nebulizer solution 0.5 mg (has no administration in time range)  levalbuterol (XOPENEX) nebulizer solution 1.25 mg (has no administration in time range)  naloxone ([facility] injection 0.4 mg (has no administration in time range)  fentaNYL (SUBLIMAZE) injection 50 mcg (has no administration in time range)  hydrALAZINE (APRESOLINE) injection 10 mg (has no administration in time range)  magnesium sulfate IVPB 2 g 50 mL (0 g Intravenous Stopped 07/14/21 0001)  albuterol (PROVENTIL) (2.5 MG/3ML)  0.083% nebulizer solution (10 mg/hr Nebulization Given 07/13/21 2301)  methylPREDNISolone sodium succinate (SOLU-MEDROL) 125 mg/2 mL injection 125 mg (125 mg Intravenous Given 07/13/21 2315)  hydrALAZINE (APRESOLINE) injection 2 mg (2 mg Intravenous Given 07/13/21 2359)  aspirin chewable tablet 324 mg (324 mg Oral Given 07/14/21 0244)  albuterol (PROVENTIL) (2.5 MG/3ML) 0.083% nebulizer solution 5 mg (5 mg Nebulization Given 07/14/21 0240)  heparin bolus via infusion 4,000 Units (4,000 Units Intravenous Bolus from Bag 07/14/21 0250)  hydrALAZINE (APRESOLINE) injection 5 mg (5 mg Intravenous Given 07/14/21 0258)    ED Course/ Medical Decision Making/ A&P                           Medical Decision Making SOB x 3 days   Problems Addressed: COPD exacerbation ([place]: acute illness or injury    Details: nebs and solumedrol Renal failure, unspecified chronicity: chronic illness or injury  Amount and/or Complexity of Data Reviewed Independent Historian: spouse    Details: see above External Data Reviewed: notes.    Details: previous notes reviewed Labs: ordered.    Details: all labs reviewed:  sodium 136, potassium 3.8 and elevated BUN 25 and creatinine 2.1, elevated white count 11.8 low hemoglobin 10.5, normal platelets 393k BNP normal 51.6.  2 elevated troponins Radiology: ordered.    Details: NO PNA by me on CXR ECG/medicine tests: ordered and independent interpretation performed. Decision-making details documented in ED Course. Discussion of management or test interpretation with external provider(s): Case d/w Dr. HVelia Meyerof [facility]   Risk OTC drugs. Prescription drug management. Drug therapy requiring intensive monitoring for toxicity. Decision regarding hospitalization.  Critical Care Total time providing critical care: 60 minutes (Multiple nebs and reassessment and heparin drip, VQ ordered by me as patient can't have CTA PE given renal failure.  Concern for PE given elevated  troponins and cancer and s/p surgical procedure.  )   Final Clinical Impression(s) / ED Diagnoses Final diagnoses:  COPD exacerbation ([place]  Renal failure, unspecified chronicity   The patient appears reasonably stabilized for admission considering the current resources, flow, and capabilities available in the ED at this time, and I doubt any [facility]iring further screening and/or treatment in the ED prior to admission.      Lavene Penagos, MD 07/14/21 [phone]

## 2021-07-14 NOTE — Progress Notes (Signed)
  Carryover admission to the Day Admitter.  I discussed this case with the EDP, Dr. Randal Buba.  Per these discussions:  This is a 66 year old male with history of COPD, paroxysmal atrial fibrillation, not chronically anticoagulated, stage IIIb chronic kidney disease with baseline creatinine 1.9-2.2, hypertension, renal cell carcinoma status post ablation, chronically elevated troponin, who is being admitted for hypertensive crisis atypical chest pain, presenting with shortness of breath and nonexertional substernal chest tightness over the last few days.  Has noted some recent wheezing.   He reportedly recently undergone renal biopsy.  Reportedly has a known history of hypertension, with outpatient hypertensive regimen including valsartan as well as HCTZ.  Initial systolic blood pressures noted to be in the 220s mmHg, improving into the 170s following prn doses of IV hydralazine.  O2 sats in the mid to high 90s on room air.  Presenting labs notable for baseline creatinine of around 2.0, along with initial high-sensitivity troponin I 50, with repeat value trending down to 46.  This is relative to most recent prior high-sensitivity troponin noted to be 57 in October 2022.  Chest x-ray reportedly clear.  Concern for potential acute pulmonary embolism given presenting shortness of breath, atypical chest discomfort, history of underlying malignancy and recent kidney biopsy.  However, given the patient's renal function, unable to pursue CTA chest at this time.  EDP is ordered VQ scan, with anticipation scan will occur in the morning.  In the meantime, EDP is initiated empiric heparin drip.  EKG reportedly shows sinus rhythm without evidence of acute ischemic changes.  I have placed an order for inpatient admit at [facility].   I have placed some additional preliminary admit orders via the adult multi-morbid admission order set. I have also ordered prn IV hydralazine for systolic blood pressure greater than 180.  Prn  IV fentanyl for any residual chest discomfort.  All patient does not initially appear to be in an acute COPD exacerbation, ordered scheduled Xopenex/ipratropium nebulizers as well as prn Xopenex nebulizers in addition to ordering VBG for the morning.  Elected for [employer] given history of paroxysmal atrial fibrillation, although he does appear to be in sinus rhythm at this time.  Also placed orders for standard morning labs.    Babs Bertin, DO Hospitalist

## 2021-07-14 NOTE — Progress Notes (Signed)
Patient was having the appearance of ST elevation on telemetry. EKG obtained and reviewed with charge nurse/rapid response nurse. Charge nurse recommended getting another ekg, since ST elevation increased even more prior to first ekg. Dr. Aileen Fass notified and came to bedside to view ekg. Dr. Aileen Fass determined that it was lvh. Will continue to monitor.

## 2021-07-14 NOTE — H&P (Signed)
History and Physical  Kevin [name] DOB: Aug 29, 1955 DOA: 07/13/2021  PCP: Lucianne Lei, MD Patient coming from: home  I have personally briefly reviewed patient's old medical records in [place]   Chief Complaint: SOB  HPI: Kevin [name] is a 66 y.o. male past medical history of COPD, paroxysmal atrial fibrillation not on anticoagulation, chronic kidney disease stage III with baseline creatinine around 2, essential hypertension, renal cell carcinoma status post ablation, with chronically elevated troponins who is being admitted for hypertensive crisis and atypical shortness of breath with nonexertional chest pain over the last few days.  He relates he has not taken his medication 3-4 times in the last week initial blood pressure on admission was greater than 220/110, he was given IV hydralazine and his blood pressure slightly improved after this he relates his shortness of breath improved. His saturations have been greater than 90% he is not tachycardic.  In the ED: Chest x-ray was unremarkable the ED physician is concerned about a PE, VQ scan has been ordered and CT angio cannot be performed due to his renal dysfunction.  He was started on IV heparin per ED twelve-lead EKG showed no signs of ischemia he is not tachycardic    Review of Systems: All systems reviewed and apart from history of presenting illness, are negative.  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   Past Surgical History:  Procedure Laterality Date   IR RADIOLOGIST EVAL & MGMT  05/31/2021   LEFT HEART CATH AND CORONARY ANGIOGRAPHY N/A 10/23/2016   Procedure: LEFT HEART CATH AND CORONARY ANGIOGRAPHY;  Surgeon: Leonie Man, MD;  Location: [facility];  Service: Cardiovascular;  Laterality: N/A;   LOWER EXTREMITY ANGIOGRAPHY N/A 07/17/2016   Procedure: Lower Extremity Angiography;  Surgeon: Lorretta Harp, MD;  Location: [place];  Service: Cardiovascular;  Laterality: N/A;   LOWER EXTREMITY INTERVENTION N/A 06/05/2016   Procedure: Lower Extremity Intervention;  Surgeon: Lorretta Harp, MD;  Location: [place] [facility];  Service: Cardiovascular;  Laterality: N/A;   PERIPHERAL VASCULAR ATHERECTOMY Right 07/17/2016   Procedure: Peripheral Vascular Atherectomy;  Surgeon: Lorretta Harp, MD;  Location: [place];  Service: Cardiovascular;  Laterality: Right;  SFA   PERIPHERAL VASCULAR INTERVENTION  06/05/2016   Procedure: Peripheral Vascular Intervention;  Surgeon: Lorretta Harp, MD;  Location: [place] [facility];  Service: Cardiovascular;;  left SFA   RADIOLOGY WITH ANESTHESIA Right 06/29/2021   Procedure: RIGHT RENAL CRYOABLATION;  Surgeon: Markus Daft, MD;  Location: [facility];  Service: Anesthesiology;  Laterality: Right;   Social History:  reports that he has been smoking cigarettes and e-cigarettes. He has a 4.20 pack-year smoking history. He has never used smokeless tobacco. He reports that he does not currently use alcohol after a past usage of  about 2.0 standard drinks per week. He reports that he does not currently use drugs.   No Known Allergies  Family History  Problem Relation Age of Onset   High blood pressure Mother    Lupus Mother     Prior to Admission medications   Medication Sig Start Date End Date Taking? Authorizing Provider  albuterol (PROVENTIL) (2.5  MG/3ML) 0.083% nebulizer solution Take 3 mLs (2.5 mg total) by nebulization every 4 (four) hours as needed for wheezing or shortness of breath. Patient taking differently: Take 5 mg by nebulization daily as needed for wheezing or shortness of breath. 05/13/21  Yes [name] Henle, MD  albuterol (VENTOLIN HFA) 108 (90 Base) MCG/ACT inhaler Inhale 2 puffs into the lungs every 6 (six) hours as needed for wheezing or shortness of breath. Patient taking differently: Inhale 2 puffs into the lungs 2 (two) times daily as needed for wheezing or shortness of breath. 04/19/21  Yes Raspet, Erin K, PA-C  dolutegravir-lamiVUDine (DOVATO) 50-300 MG tablet Take 1 tablet by mouth daily. 06/15/21  Yes [name], [name], MD  escitalopram (LEXAPRO) 10 MG tablet Take 20 mg by mouth daily. 06/16/21  Yes [provider]  Multiple Vitamin (MULTIVITAMIN) tablet Take 1 tablet by mouth daily.   Yes [provider]  valsartan-hydrochlorothiazide (DIOVAN-HCT) 80-12.5 MG tablet Take 1 tablet by mouth daily. 06/09/21  Yes [provider]  morphine (MSIR) 15 MG tablet Take 1 tablet (15 mg total) by mouth every 6 (six) hours as needed for severe pain. Patient not taking: Reported on 07/13/2021 07/10/21   Varney Biles, MD   Physical Exam: Vitals:   07/14/21 0445 07/14/21 0447 07/14/21 0512 07/14/21 0600  BP: (!) 182/114  (!) 230/110 (!) 177/84  Pulse: 81  86 79  Resp: '19  18 14  '$ Temp: 97.8 F (36.6 C)     TempSrc: Oral     SpO2: 97%  98% 95%  Weight:  69.1 kg    Height:        General exam: Moderately built and nourished patient, lying comfortably supine on the gurney in no obvious distress. Head, eyes and ENT: Nontraumatic and normocephalic. Neck: Supple. No JVD, carotid bruit or thyromegaly. Lymphatics: No lymphadenopathy. Respiratory system: Good air movement and clear to auscultation. Cardiovascular system: S1 and S2 heard, RRR.  Positive JVD and hepatojugular reflux Gastrointestinal  system: Bowel sounds soft nontender nondistended Central nervous system: Alert and oriented. No focal neurological deficits. Extremities: Symmetric 5 x 5 power. Peripheral pulses symmetrically felt.  Skin: No rashes or acute findings. Musculoskeletal system: Negative exam. Psychiatry: Pleasant and cooperative.   Labs on Admission:  Basic Metabolic Panel: Recent Labs  Lab 07/09/21 1215 07/10/21 0841 07/13/21 2205 07/14/21 0703  NA 140 141 136 137  K 4.2 3.7 3.8 4.4  CL 110 108 109 107  CO2 24  --  21* 19*  GLUCOSE 86 88 113* 256*  BUN 31* 23 25* 25*  CREATININE 2.64* 1.90* 2.10* 2.03*  CALCIUM 8.6*  --  8.6* 9.0  MG  --   --   --  2.3   Liver Function Tests: Recent Labs  Lab 07/09/21 1215 07/14/21 0703  AST 15 23  ALT 16 14  ALKPHOS 50 64  BILITOT 0.5 0.7  PROT 6.6 7.9  ALBUMIN 2.9* 3.5   Recent Labs  Lab 07/09/21 1215  LIPASE 60*   No results for input(s): AMMONIA in the last 168 hours. CBC: Recent Labs  Lab 07/09/21 1215  07/10/21 0841 07/13/21 2205  WBC 8.9  --  11.8*  NEUTROABS 5.7  --   --   HGB 9.6* 9.2* 10.5*  HCT 28.9* 27.0* 30.3*  MCV 96.0  --  94.1  PLT 317  --  393   Cardiac Enzymes: Recent Labs  Lab 07/09/21 1215  CKTOTAL 76    BNP (last 3 results) No results for input(s): PROBNP in the last 8760 hours. CBG: No results for input(s): GLUCAP in the last 168 hours.  Radiological Exams on Admission: DG Chest 2 View  Result Date: 07/13/2021 CLINICAL DATA:  Shortness of breath EXAM: CHEST - 2 VIEW COMPARISON:  06/29/2021 FINDINGS: The heart size and mediastinal contours are within normal limits. Both lungs are clear. The visualized skeletal structures are unremarkable. IMPRESSION: Negative. Electronically Signed   By: Rolm Baptise M.D.   On: 07/13/2021 22:06   CT Renal Stone Study  Result Date: 07/14/2021 CLINICAL DATA:  Right flank pain EXAM: CT ABDOMEN AND PELVIS WITHOUT CONTRAST TECHNIQUE: Multidetector CT imaging of the abdomen and  pelvis was performed following the standard protocol without IV contrast. RADIATION DOSE REDUCTION: This exam was performed according to the departmental dose-optimization program which includes automated exposure control, adjustment of the mA and/or kV according to patient size and/or use of iterative reconstruction technique. COMPARISON:  07/09/2021, MRI 05/14/2021 FINDINGS: Lower chest: No acute abnormality Hepatobiliary: No focal hepatic abnormality. Gallbladder unremarkable. Pancreas: No focal abnormality or ductal dilatation. Spleen: No focal abnormality.  Normal size. Adrenals/Urinary Tract: Low-density lesion again noted posteriorly in the midpole of the right kidney, unchanged since recent study. This has enlarged since prior MRI from 05/14/2021. This recently has undergone biopsy and cryoablation, likely the cause of the increased size. No visible surrounding hematoma. No renal or ureteral stones. No hydronephrosis. Adrenal glands and urinary bladder unremarkable. Stomach/Bowel: Normal appendix. Stomach, large and small bowel grossly unremarkable. Vascular/Lymphatic: No evidence of aneurysm or adenopathy. Scattered aortic calcifications. Reproductive: No visible focal abnormality. Other: No free fluid or free air. Musculoskeletal: No acute bony abnormality. IMPRESSION: 3.8 cm low-density lesion posteriorly in the midpole of the right kidney which cannot be characterized on this noncontrast study. This appears significantly larger when compared to prior MRI. This has recently undergone biopsy and cryoablation, likely the cause of the apparent increased size of the lesion. No visible complicating feature. No renal or ureteral stones.  No hydronephrosis. Aortic atherosclerosis. Electronically Signed   By: Rolm Baptise M.D.   On: 07/14/2021 00:44    EKG: Independently reviewed.  Sinus rhythm LVH T wave inversions in inferior leads  Assessment/Plan Hypertensive crisis leading to acute on chronic diastolic  heart failure  NYHA class 3 ([place]): His blood pressure is 240/110 in the stepdown unit.  He was given IV hydralazine in the ED and his blood pressure improved he relates that after this his shortness of breath improved. We will go ahead and give him hydralazine. We will start him on oral Coreg, IV Lasix, continue his ARB and hydralazine IV as needed. He is definitely appear fluid overloaded on physical exam has hepatojugular reflex with JVD. We will try to titrate his blood pressure about 25%. 2D echo has been done about 6 months ago that showed an EF of 55% with grade 2 diastolic heart failure. Cardiac biomarkers are elevated likely due to chronic renal disease. The ED physician was concerned about a PE there is unlikely he is not tachycardic saturations are greater than 92%.  He relates he had not  taking his intensive medication about 3-4 times this past week. He relates that started gradually. He is currently on heparin will await VQ scan.  HIV disease ([place]) Resume his home regimen.  PAF (paroxysmal atrial fibrillation) (HCC) In sinus rhythm start low-dose Coreg.  Essential hypertension Started Coreg, continue ARB, 2 doses of Lasix IV to help with diuresis. Continue  IV hydralazine as needed we will try to decrease blood pressure by 25%  Stage 3 chronic kidney disease ([place]) Probably his creatinine will rise with control of his blood pressure as of now his probably his creatinine is low due to shear pressure.  Papillary renal cell carcinoma [facility]) Follow-up with oncologist as an outpatient.    DVT Prophylaxis: lovenox Code Status: full  Family Communication: none  Disposition Plan: inpatient   Time spent: 95 min    It is my clinical opinion that admission to INPATIENT is reasonable and necessary in this 66 y.o. male past medical history of chronic diastolic heart failure paroxysmal atrial fibrillation who comes in with a blood pressure of 220/110 complaining of shortness of  breath with a last EF of 55% with grade 2 diastolic heart failure.  Given the aforementioned, the predictability of an adverse outcome is felt to be significant. I expect that the patient will require at least 2 midnights in the hospital to treat this condition.  Charlynne Cousins MD [facility]   07/14/2021, 8:04 AM

## 2021-07-14 NOTE — Plan of Care (Signed)

## 2021-07-14 NOTE — Progress Notes (Signed)
Lower extremity venous bilateral study completed.   Please see CV Proc for preliminary results.   Zaccheus Edmister, RDMS, RVT  

## 2021-07-14 NOTE — Final Progress Note (Signed)
Received patient from ICU via bed.  Patient is awake, alert and oriented x 4.  Family at bedside.  Denies pain, no complaints offered at this time.  Assisted in position of comfort in bed.  Oriented to room and unit routine, needs addressed.

## 2021-07-14 NOTE — Progress Notes (Signed)
ANTICOAGULATION CONSULT NOTE - Initial Consult  Pharmacy Consult for heparin Indication: pulmonary embolus  No Known Allergies  Patient Measurements: Height: 6' (182.9 cm) Weight: 74.8 kg (165 lb) IBW/kg (Calculated) : 77.6 Heparin Dosing Weight: 74.8kg  Vital Signs: Temp: 98.4 F (36.9 C) (05/31 2143) Temp Source: Oral (05/31 2143) BP: 162/79 (06/01 0200) Pulse Rate: 77 (06/01 0200)  Labs: Recent Labs    07/13/21 2205 07/13/21 2315 07/14/21 0110  HGB 10.5*  --   --   HCT 30.3*  --   --   PLT 393  --   --   CREATININE 2.10*  --   --   TROPONINIHS  --  50* 46*    Estimated Creatinine Clearance: 38.1 mL/min (A) (by C-G formula based on SCr of 2.1 mg/dL (H)).   Medical History: Past Medical History:  Diagnosis Date   AIDS (acquired immune deficiency syndrome) ([place]) 08/17/2016   Chronic diastolic CHF (congestive heart failure), NYHA class 3 (HCC) 01/2016   Chronic lower back pain    CKD (chronic kidney disease), stage III (HCC)    COPD (chronic obstructive pulmonary disease) (HCC)    Gout    "forearms, hands, ankles, feet" (06/05/2016)   Headache    "weekly" (06/05/2016)   Heart murmur    Hypertension    Hypertensive crisis 08/15/2017   OSA on CPAP    PAD (peripheral artery disease) (HCC)    PAF (paroxysmal atrial fibrillation) ([place]) 01/2016   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  Pharmacy consulted to dose heparin drip for PE.  No prior AC noted  Hgb 10.5, plts 393, Trop 46, Scr 2.1  Goal of Therapy:  Heparin level 0.3-0.7 units/ml Monitor platelets by anticoagulation protocol: Yes   Plan:  Baseline labs ordered STAT Heparin bolus 4000 units x 1 Start heparin drip at 1200 units/hr Heparin level in 6 hours Daily CBC  Dolly Rias RPh 07/14/2021, 2:23 AM

## 2021-07-15 DIAGNOSIS — I5031 Acute diastolic (congestive) heart failure: Secondary | ICD-10-CM | POA: Diagnosis not present

## 2021-07-15 DIAGNOSIS — I169 Hypertensive crisis, unspecified: Secondary | ICD-10-CM | POA: Diagnosis not present

## 2021-07-15 DIAGNOSIS — R0602 Shortness of breath: Secondary | ICD-10-CM

## 2021-07-15 DIAGNOSIS — I119 Hypertensive heart disease without heart failure: Secondary | ICD-10-CM

## 2021-07-15 DIAGNOSIS — I5033 Acute on chronic diastolic (congestive) heart failure: Secondary | ICD-10-CM | POA: Diagnosis not present

## 2021-07-15 DIAGNOSIS — I249 Acute ischemic heart disease, unspecified: Secondary | ICD-10-CM

## 2021-07-15 DIAGNOSIS — R778 Other specified abnormalities of plasma proteins: Secondary | ICD-10-CM | POA: Diagnosis not present

## 2021-07-15 LAB — CBC
HCT: 28.7 % — ABNORMAL LOW (ref 39.0–52.0)
Hemoglobin: 9.6 g/dL — ABNORMAL LOW (ref 13.0–17.0)
MCH: 31.6 pg (ref 26.0–34.0)
MCHC: 33.4 g/dL (ref 30.0–36.0)
MCV: 94.4 fL (ref 80.0–100.0)
Platelets: 391 10*3/uL (ref 150–400)
RBC: 3.04 MIL/uL — ABNORMAL LOW (ref 4.22–5.81)
RDW: 15.9 % — ABNORMAL HIGH (ref 11.5–15.5)
WBC: 13.2 10*3/uL — ABNORMAL HIGH (ref 4.0–10.5)
nRBC: 0 % (ref 0.0–0.2)

## 2021-07-15 LAB — BASIC METABOLIC PANEL
Anion gap: 7 (ref 5–15)
BUN: 38 mg/dL — ABNORMAL HIGH (ref 8–23)
CO2: 23 mmol/L (ref 22–32)
Calcium: 8.9 mg/dL (ref 8.9–10.3)
Chloride: 108 mmol/L (ref 98–111)
Creatinine, Ser: 2.51 mg/dL — ABNORMAL HIGH (ref 0.61–1.24)
GFR, Estimated: 28 mL/min — ABNORMAL LOW (ref >60)
Glucose, Bld: 104 mg/dL — ABNORMAL HIGH (ref 70–99)
Potassium: 4 mmol/L (ref 3.5–5.1)
Sodium: 138 mmol/L (ref 135–145)

## 2021-07-15 LAB — TROPONIN I (HIGH SENSITIVITY)
Troponin I (High Sensitivity): 38 ng/L — ABNORMAL HIGH (ref <18)
Troponin I (High Sensitivity): 38 ng/L — ABNORMAL HIGH (ref <18)

## 2021-07-15 LAB — HEPARIN LEVEL (UNFRACTIONATED): Heparin Unfractionated: 0.51 IU/mL (ref 0.30–0.70)

## 2021-07-15 MED ORDER — FUROSEMIDE 10 MG/ML IJ SOLN
40.0000 mg | Freq: Two times a day (BID) | INTRAMUSCULAR | Status: DC
  Administered 2021-07-15: 40 mg via INTRAVENOUS
  Filled 2021-07-15: qty 4

## 2021-07-15 MED ORDER — BOOST / RESOURCE BREEZE PO LIQD CUSTOM
1.0000 | Freq: Two times a day (BID) | ORAL | Status: DC
  Administered 2021-07-15 – 2021-07-17 (×2): 1 via ORAL

## 2021-07-15 MED ORDER — NITROGLYCERIN 0.4 MG SL SUBL
0.4000 mg | SUBLINGUAL_TABLET | SUBLINGUAL | Status: DC | PRN
Start: 2021-07-15 — End: 2021-07-17
  Administered 2021-07-15: 0.4 mg via SUBLINGUAL
  Filled 2021-07-15: qty 1

## 2021-07-15 MED ORDER — NITROGLYCERIN 0.4 MG SL SUBL
SUBLINGUAL_TABLET | SUBLINGUAL | Status: AC
  Filled 2021-07-15: qty 1

## 2021-07-15 MED ORDER — ATORVASTATIN CALCIUM 40 MG PO TABS
40.0000 mg | ORAL_TABLET | Freq: Every day | ORAL | Status: DC
  Administered 2021-07-15 – 2021-07-17 (×3): 40 mg via ORAL
  Filled 2021-07-15 (×3): qty 1

## 2021-07-15 MED ORDER — ADULT MULTIVITAMIN W/MINERALS CH
1.0000 | ORAL_TABLET | Freq: Every day | ORAL | Status: DC
  Administered 2021-07-15 – 2021-07-17 (×3): 1 via ORAL
  Filled 2021-07-15 (×3): qty 1

## 2021-07-15 MED ORDER — HEPARIN (PORCINE) 25000 UT/250ML-% IV SOLN
1200.0000 [IU]/h | INTRAVENOUS | Status: DC
  Administered 2021-07-15 – 2021-07-16 (×2): 1200 [IU]/h via INTRAVENOUS
  Filled 2021-07-15 (×2): qty 250

## 2021-07-15 MED ORDER — ISOSORB DINITRATE-HYDRALAZINE 20-37.5 MG PO TABS
1.0000 | ORAL_TABLET | Freq: Three times a day (TID) | ORAL | Status: DC
  Filled 2021-07-15: qty 1

## 2021-07-15 MED ORDER — HEPARIN BOLUS VIA INFUSION
4000.0000 [IU] | Freq: Once | INTRAVENOUS | Status: AC
Start: 2021-07-15 — End: 2021-07-15
  Administered 2021-07-15: 4000 [IU] via INTRAVENOUS
  Filled 2021-07-15: qty 4000

## 2021-07-15 NOTE — Plan of Care (Signed)

## 2021-07-15 NOTE — Progress Notes (Signed)
Reviewed clinical scenario and ECG with Dr. Aileen Fass  The patient had dyspnea and chest pain responsive to nitroglycerin. ECG shows sinus bradycardia with changes that are highly compatible with left ventricular hypertrophy with secondary repolarization changes (note asymmetrical T wave inversion with rapid upslope).  There is also nonspecific ST segment elevation in the mid precordial leads that is chronic and likely also related to LVH.  The patient had no evidence of coronary stenoses on cardiac catheterization in 2018, but interestingly does have a fixed inferior wall perfusion abnormality on nuclear stress test performed both before and after that heart catheterization (most recently in February 2021).  On the other hand, echocardiography, most recently performed in October 2022 does not show any regional wall motion abnormalities and shows LVH and normal LVEF of 55-60%.  He does have vascular risk factors and did undergo peripheral artery revascularization in 2018.  Advanced chronic kidney disease, stage IV.  He is currently chest pain-free.  I do not think that he is experiencing ST segment elevation myocardial infarction, but further evaluation is recommended.  We will check cardiac enzymes (keeping in mind that he has chronic mild elevation in high-sensitivity troponin almost always in the 50-70 range).  We will be available for additional consultation today and over the weekend.

## 2021-07-15 NOTE — Progress Notes (Signed)
Patient appearing to have ST elevation on telemetry. Pt denies CP. Pt continues to c/o SOB unchanged from presentation at admission. Lung sounds diminished, respirations regular & unlabored. Vital signs obtained:   BP 121/75 HR 65 RR 16 O2 99% on RA  Dr Hal Hope notified. EKG ordered & obtained. MD on unit to assess pt and review EKG. Troponin order placed. Will continue to monitor.

## 2021-07-15 NOTE — Evaluation (Signed)
Physical Therapy Evaluation Patient Details Name: Kevin [name] MRN: [id number] DOB: September 26, 1955 Today's Date: 07/15/2021  History of Present Illness  Pt is a 65 y.o. male admitted for hypertensive crisis and atypical shortness of breath with nonexertional chest pain over the last few days. PMH significant for COPD, paroxysmal atrial fibrillation not on anticoagulation, chronic kidney disease stage III, essential hypertension, renal cell carcinoma status post ablation, with chronically elevated troponins.  Clinical Impression   Pt is a 66 y.o. male with above HPI. Pt is independent and presenting at baseline mobility level on eval ambulating 359f and negotiating stairs with no assistive device. No further skilled PT needs identified. PT will sign off. Pt to continue mobilizing with nursing staff during stay. Please reconsult if mobility status changes.      Recommendations for follow up therapy are one component of a multi-disciplinary discharge planning process, led by the attending physician.  Recommendations may be updated based on patient status, additional functional criteria and insurance authorization.  Follow Up Recommendations No PT follow up    Assistance Recommended at Discharge None  Patient can return home with the following       Equipment Recommendations None recommended by PT  Recommendations for Other Services       Functional Status Assessment Patient has had a recent decline in their functional status and demonstrates the ability to make significant improvements in function in a reasonable and predictable amount of time.     Precautions / Restrictions Precautions Precautions: None Restrictions Weight Bearing Restrictions: No      Mobility  Bed Mobility Overal bed mobility: Independent                  Transfers Overall transfer level: Independent                      Ambulation/Gait Ambulation/Gait assistance: Independent Gait Distance  (Feet): 300 Feet Assistive device: None Gait Pattern/deviations: Step-through pattern Gait velocity: decr     General Gait Details: no overt LOB observed, slightly decreased gait.  Stairs Stairs: Yes Stairs assistance: Modified independent (Device/Increase time) Stair Management: One rail Right, Alternating pattern, Forwards Number of Stairs: 4 General stair comments: increased time to complete  Wheelchair Mobility    Modified Rankin (Stroke Patients Only)       Balance Overall balance assessment: No apparent balance deficits (not formally assessed)                                           Pertinent Vitals/Pain Pain Assessment Pain Assessment: No/denies pain    Home Living Family/patient expects to be discharged to:: Private residence Living Arrangements: Children (148y.o. daughter) Available Help at Discharge: Family (other daughters) Type of Home: House Home Access: Stairs to enter Entrance Stairs-Rails: None ETechnical brewerof Steps: 3   Home Layout: One level Home Equipment: SElectronics engineerComments: works part time as a cAnimatorPrior Level of Function : Independent/Modified Independent                     HJournalist, newspaper  Dominant Hand: Right    Extremity/Trunk Assessment   Upper Extremity Assessment Upper Extremity Assessment: Overall WFL for tasks assessed    Lower Extremity Assessment Lower Extremity Assessment: Overall WFL for tasks assessed    Cervical / Trunk Assessment  Cervical / Trunk Assessment: Normal  Communication   Communication: No difficulties  Cognition Arousal/Alertness: Awake/alert Behavior During Therapy: WFL for tasks assessed/performed Overall Cognitive Status: Within Functional Limits for tasks assessed                                          General Comments      Exercises     Assessment/Plan    PT Assessment Patient does not need any  further PT services  PT Problem List         PT Treatment Interventions      PT Goals (Current goals can be found in the Care Plan section)  Acute Rehab PT Goals Patient Stated Goal: Feel better and continue recovery PT Goal Formulation: With patient Time For Goal Achievement: 07/29/21 Potential to Achieve Goals: Good    Frequency       Co-evaluation               AM-PAC PT "6 Clicks" Mobility  Outcome Measure Help needed turning from your back to your side while in a flat bed without using bedrails?: None Help needed moving from lying on your back to sitting on the side of a flat bed without using bedrails?: None Help needed moving to and from a bed to a chair (including a wheelchair)?: None Help needed standing up from a chair using your arms (e.g., wheelchair or bedside chair)?: None Help needed to walk in hospital room?: None Help needed climbing 3-5 steps with a railing? : None 6 Click Score: 24    End of Session Equipment Utilized During Treatment: Gait belt Activity Tolerance: Patient tolerated treatment well Patient left: in chair;with call bell/phone within reach Nurse Communication: Mobility status PT Visit Diagnosis: Unsteadiness on feet (R26.81)    Time: 2202-5427 PT Time Calculation (min) (ACUTE ONLY): 13 min   Charges:   PT Evaluation $PT Eval Low Complexity: 1 Low          Festus Barren PT, DPT  Acute Rehabilitation Services  Office [phone]  07/15/2021, 9:40 AM

## 2021-07-15 NOTE — Progress Notes (Addendum)
ANTICOAGULATION CONSULT NOTE - Initial Consult  Pharmacy Consult for heparin Indication: chest pain/ACS  No Known Allergies  Patient Measurements: Height: 6' (182.9 cm) Weight: 69.2 kg (152 lb 8.9 oz) IBW/kg (Calculated) : 77.6 Heparin Dosing Weight: 69.2 kg  Vital Signs: Temp: 97.9 F (36.6 C) (06/02 1437) Temp Source: Oral (06/02 1437) BP: 126/71 (06/02 1517) Pulse Rate: 65 (06/02 1517)  Labs: Recent Labs    07/13/21 2205 07/13/21 2315 07/14/21 0110 07/14/21 0245 07/14/21 0703 07/14/21 0841 07/15/21 0507  HGB 10.5*  --   --   --   --   --  9.6*  HCT 30.3*  --   --   --   --   --  28.7*  PLT 393  --   --   --   --   --  391  APTT  --   --   --  33  --   --   --   LABPROT  --   --   --  14.6  --   --   --   INR  --   --   --  1.2  --   --   --   HEPARINUNFRC  --   --   --   --   --  0.52  --   CREATININE 2.10*  --   --   --  2.03*  --  2.51*  TROPONINIHS  --  50* 46*  --   --   --   --     Estimated Creatinine Clearance: 29.5 mL/min (A) (by C-G formula based on SCr of 2.51 mg/dL (H)).   Medical History: Past Medical History:  Diagnosis Date   AIDS (acquired immune deficiency syndrome) ([place]) 08/17/2016   Chronic diastolic CHF (congestive heart failure), NYHA class 3 ([facility]CC) 01/2016   Chronic lower back pain    CKD (chronic kidney disease), stage III (HCC)    COPD (chronic obstructive pulmonary disease) (HCC)    Gout    "forearms, hands, ankles, feet" (06/05/2016)   Headache    "weekly" (06/05/2016)   Heart murmur    Hypertension    Hypertensive crisis 08/15/2017   OSA on CPAP    PAD (peripheral artery disease) (HCC)    PAF (paroxysmal atrial fibrillation) ([place]) 01/2016   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    Medications:  Scheduled:   nitroGLYCERIN       amLODipine  2.5 mg Oral Daily   aspirin EC  81 mg Oral Daily   atorvastatin  40 mg Oral Daily   carvedilol  6.25 mg Oral BID WC   dolutegravir  50 mg Oral QHS   And   lamiVUDine  300 mg Oral  QHS   escitalopram  20 mg Oral Daily   feeding supplement  1 Container Oral BID BM   ipratropium-albuterol  3 mL Nebulization BID   irbesartan  300 mg Oral Daily   isosorbide-hydrALAZINE  1 tablet Oral TID   mouth rinse  15 mL Mouth Rinse BID   multivitamin with minerals  1 tablet Oral Daily   sodium chloride flush  3 mL Intravenous Q12H    Assessment: 66 yo male admitted for hypertensive crisis and atypical shortness of breath and chest pain.  Was on IV heparin on 6/1 for questionable PE, however anticoagulation was d/c'd 6/1 PM due to indeterminate V/Q scan. Today, patient is reporting chest tightness and shortness of breath and pharmacy is consulted to re-dose heparin drip  for new STEMI on EKG.    Heparin level was previously therapeutic at 0.52 after heparin 4000 units bolus x1 and infusion at 1200 units/hr.  Will resume heparin at previous rate.   Goal of Therapy:  Heparin level 0.3-0.7 units/ml Monitor platelets by anticoagulation protocol: Yes   Plan:  Heparin 4000 units bolus IV x1 Start heparin drip at 1200 units/hr Check heparin level 6 hours after initiation Monitor daily heparin level and CBC  Dimple Nanas, PharmD 07/15/2021 3:43 PM

## 2021-07-15 NOTE — Progress Notes (Signed)
Initial Nutrition Assessment  INTERVENTION:   -Boost Breeze po BID, each supplement provides 250 kcal and 9 grams of protein   -Multivitamin with minerals daily  NUTRITION DIAGNOSIS:   Increased nutrient needs related to chronic illness as evidenced by estimated needs.  GOAL:   Patient will meet greater than or equal to 90% of their needs   MONITOR:   PO intake, Supplement acceptance, Labs, Weight trends, I & O's, Skin  REASON FOR ASSESSMENT:   Malnutrition Screening Tool    ASSESSMENT:   66 y.o. male  past medical history of COPD, paroxysmal atrial fibrillation not on anticoagulation, chronic kidney disease stage III with baseline creatinine around 2, essential hypertension, renal cell carcinoma status post ablation, with chronically elevated troponins who is being admitted for hypertensive crisis and atypical shortness of breath with nonexertional chest pain over the last few days.  Patient in room, states he typically eats well. He has been a Biomedical scientist for 40 years. Hasn't been eating as well here d/t not liking the food. Ate some grits and a muffin this morning. Denies issues with swallowing or chewing. Had questions about why he is on a fluid restriction if he needs to have increased urinary output, explained role of fluid restriction.  Pt states he cannot have Ensure as he does not tolerate milk based products. Tolerated Boost Breeze yesterday, switched supplement order. Added daily MVI as pt takes a daily MVI at home.  Reports UBW of 160 lbs.  Current weight: 152 lbs.  I/Os: -1.2L since admit  Medications: Lasix  Labs reviewed.  NUTRITION - FOCUSED PHYSICAL EXAM:  Flowsheet Row Most Recent Value  Orbital Region No depletion  Upper Arm Region No depletion  Thoracic and Lumbar Region No depletion  Buccal Region No depletion  Temple Region Mild depletion  Clavicle Bone Region No depletion  Clavicle and Acromion Bone Region No depletion  Scapular Bone Region No  depletion  Dorsal Hand No depletion  Patellar Region No depletion  Anterior Thigh Region No depletion  Posterior Calf Region No depletion  Edema (RD Assessment) None  Hair Reviewed  Mouth Reviewed  Skin Reviewed       Diet Order:   Diet Order             Diet 2 gram sodium Room service appropriate? Yes; Fluid consistency: Thin; Fluid restriction: 1500 mL Fluid  Diet effective now                   EDUCATION NEEDS:   Education needs have been addressed  Skin:  Skin Assessment: Reviewed RN Assessment  Last BM:  6/1  Height:   Ht Readings from Last 1 Encounters:  07/13/21 6' (1.829 m)    Weight:   Wt Readings from Last 1 Encounters:  07/15/21 69.2 kg    BMI:  Body mass index is 20.69 kg/m.  Estimated Nutritional Needs:   Kcal:  2050-2250  Protein:  100-115g  Fluid:  2L/day  Clayton Bibles, MS, RD, LDN Inpatient Clinical Dietitian Contact information available via Amion

## 2021-07-15 NOTE — Progress Notes (Signed)
[facility] PROGRESS NOTE    Progress Note  Kevin [name]  NTI:[id number] DOB: 1955-04-01 DOA: 07/13/2021 PCP: Lucianne Lei, MD     Brief Narrative:   Kevin [name] is an 66 y.o. male  past medical history of COPD, paroxysmal atrial fibrillation not on anticoagulation, chronic kidney disease stage III with baseline creatinine around 2, essential hypertension, renal cell carcinoma status post ablation, with chronically elevated troponins who is being admitted for hypertensive crisis and atypical shortness of breath with nonexertional chest pain over the last few days.    Assessment/Plan:   Hypertensive crisis with acute on chronic diastolic heart failure: Started on amlodipine, Coreg, Avapro and Imdur along with IV Lasix and his blood pressure is improved. Still appears fluid overloaded on physical exam, switch back to IV Lasix for 24 hours. Strict I's and O's and daily weights. He is negative about 1200 cc. Continue oral Lasix recheck basic metabolic in the morning. There has been a mild increase in his creatinine. Blood pressure significantly improved compared to yesterday. Around 140/90.  HIV disease Continue current home regimen.  Paroxysmal atrial fibrillation: Sinus rhythm not anticoagulation, continue Coreg.  Essential hypertension: See above for further details.  Chronic kidney disease stage III: Creatinine this morning is 2.5 continue to monitor. There is been a mild increase in his creatinine due to blood pressure control.  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    DVT prophylaxis: lovenox Family Communication:nmone Status is: Inpatient Remains inpatient appropriate because: Hypertensive crisis in the setting of acute diastolic heart failure.    Code Status:     Code Status Orders  (From admission, onward)           Start     Ordered   07/14/21 0802  Full code  Continuous        07/14/21 0803            Code Status History     Date Active Date Inactive Code Status Order ID Comments User Context   07/14/2021 0256 07/14/2021 0804 Full Code [id number]  Rhetta Mura, DO ED   11/30/2020 0847 12/03/2020 2137 Full Code [id number]  Magdalen Spatz, NP ED   10/22/2016 2033 10/23/2016 2328 Full Code [id number]  Flossie Dibble, MD Inpatient   07/16/2016 1411 07/18/2016 1432 Full Code [id number]  Delos Haring, PA-C Inpatient   06/04/2016 1324 06/06/2016 1400 Full Code [id number]  Cheryln Manly, NP Inpatient   02/07/2016 1500 02/10/2016 1928 Full Code [id number]  Elease Hashimoto ED         IV Access:   Peripheral IV   Procedures and diagnostic studies:   DG Chest 2 View  Result Date: 07/13/2021 CLINICAL DATA:  Shortness of breath EXAM: CHEST - 2 VIEW COMPARISON:  06/29/2021 FINDINGS: The heart size and mediastinal contours are within normal limits. Both lungs are clear. The visualized skeletal structures are unremarkable. IMPRESSION: Negative. Electronically Signed   By: Rolm Baptise M.D.   On: 07/13/2021 22:06   NM Pulmonary Perfusion  Result Date: 07/14/2021 CLINICAL DATA:  Chest pain, shortness of breath EXAM: NUCLEAR MEDICINE PERFUSION LUNG SCAN TECHNIQUE: Perfusion images were obtained in multiple projections after intravenous injection of radiopharmaceutical. Ventilation scans intentionally deferred if perfusion scan and chest x-ray adequate for interpretation during COVID 19 epidemic. RADIOPHARMACEUTICALS:  4.03 mCi Tc-35mMAA IV COMPARISON:  Chest radiograph done on 07/13/2021 FINDINGS: There are ill-defined small foci of decreased perfusion in the posterior left mid  and left lower lung fields. There is moderate sized area of decreased perfusion in the posterior right lower lung fields. There are no definite wedge-shaped perfusion defects. Lung fields are essentially clear in the previous chest radiographs. IMPRESSION: There are small to moderate sized foci of decreased tracer  uptake in the posterior left mid and left lower lung fields and posterior right lower lung fields. Study is indeterminate to evaluate for pulmonary embolism. If there is continued clinical suspicion for PE follow-up CT angiogram and/or venous Doppler examination of both lower extremities may be considered. Electronically Signed   By: Elmer Picker M.D.   On: 07/14/2021 12:44   CT Renal Stone Study  Result Date: 07/14/2021 CLINICAL DATA:  Right flank pain EXAM: CT ABDOMEN AND PELVIS WITHOUT CONTRAST TECHNIQUE: Multidetector CT imaging of the abdomen and pelvis was performed following the standard protocol without IV contrast. RADIATION DOSE REDUCTION: This exam was performed according to the departmental dose-optimization program which includes automated exposure control, adjustment of the mA and/or kV according to patient size and/or use of iterative reconstruction technique. COMPARISON:  07/09/2021, MRI 05/14/2021 FINDINGS: Lower chest: No acute abnormality Hepatobiliary: No focal hepatic abnormality. Gallbladder unremarkable. Pancreas: No focal abnormality or ductal dilatation. Spleen: No focal abnormality.  Normal size. Adrenals/Urinary Tract: Low-density lesion again noted posteriorly in the midpole of the right kidney, unchanged since recent study. This has enlarged since prior MRI from 05/14/2021. This recently has undergone biopsy and cryoablation, likely the cause of the increased size. No visible surrounding hematoma. No renal or ureteral stones. No hydronephrosis. Adrenal glands and urinary bladder unremarkable. Stomach/Bowel: Normal appendix. Stomach, large and small bowel grossly unremarkable. Vascular/Lymphatic: No evidence of aneurysm or adenopathy. Scattered aortic calcifications. Reproductive: No visible focal abnormality. Other: No free fluid or free air. Musculoskeletal: No acute bony abnormality. IMPRESSION: 3.8 cm low-density lesion posteriorly in the midpole of the right kidney which  cannot be characterized on this noncontrast study. This appears significantly larger when compared to prior MRI. This has recently undergone biopsy and cryoablation, likely the cause of the apparent increased size of the lesion. No visible complicating feature. No renal or ureteral stones.  No hydronephrosis. Aortic atherosclerosis. Electronically Signed   By: Rolm Baptise M.D.   On: 07/14/2021 00:44   VAS [name] LOWER EXTREMITY VENOUS (DVT)  Result Date: 07/14/2021  Lower Venous DVT Study Patient Name:  Kevin [name]  Date of Exam:   07/14/2021 Medical Rec #: [id number]        Accession #:    [id number] Date of Birth: Jan 30, 1956        Patient Gender: M Patient Age:   8 years Exam Location:  [facility] Procedure:      VAS [name] LOWER EXTREMITY VENOUS (DVT) Referring Phys: Bess Harvest ORTIZ --------------------------------------------------------------------------------  Indications: SOB, and Swelling.  Anticoagulation: Heparin. Comparison Study: 02-18-2016 Prior bilateral lower extremity venous was negative                   for DVT. Multiple prior arterial examinations. Performing Technologist: Darlin Coco RDMS, RVT  Examination Guidelines: A complete evaluation includes B-mode imaging, spectral Doppler, color Doppler, and power Doppler as needed of all accessible portions of each vessel. Bilateral testing is considered an integral part of a complete examination. Limited examinations for reoccurring indications may be performed as noted. The reflux portion of the exam is performed with the patient in reverse Trendelenburg.  +---------+---------------+---------+-----------+----------+--------------+ RIGHT    CompressibilityPhasicitySpontaneityPropertiesThrombus Aging +---------+---------------+---------+-----------+----------+--------------+ CFV  Full           Yes      Yes                                 +---------+---------------+---------+-----------+----------+--------------+ SFJ       Full                                                        +---------+---------------+---------+-----------+----------+--------------+ FV Prox  Full                                                        +---------+---------------+---------+-----------+----------+--------------+ FV Mid   Full                                                        +---------+---------------+---------+-----------+----------+--------------+ FV DistalFull                                                        +---------+---------------+---------+-----------+----------+--------------+ PFV      Full                                                        +---------+---------------+---------+-----------+----------+--------------+ POP      Full           Yes      Yes                                 +---------+---------------+---------+-----------+----------+--------------+ PTV      Full                                                        +---------+---------------+---------+-----------+----------+--------------+ PERO     Full                                                        +---------+---------------+---------+-----------+----------+--------------+ Gastroc  Full                                                        +---------+---------------+---------+-----------+----------+--------------+   +---------+---------------+---------+-----------+----------+--------------+  LEFT     CompressibilityPhasicitySpontaneityPropertiesThrombus Aging +---------+---------------+---------+-----------+----------+--------------+ CFV      Full           Yes      Yes                                 +---------+---------------+---------+-----------+----------+--------------+ SFJ      Full                                                        +---------+---------------+---------+-----------+----------+--------------+ FV Prox  Full                                                         +---------+---------------+---------+-----------+----------+--------------+ FV Mid   Full                                                        +---------+---------------+---------+-----------+----------+--------------+ FV DistalFull                                                        +---------+---------------+---------+-----------+----------+--------------+ PFV      Full                                                        +---------+---------------+---------+-----------+----------+--------------+ POP      Full           Yes      Yes                                 +---------+---------------+---------+-----------+----------+--------------+ PTV      Full                                                        +---------+---------------+---------+-----------+----------+--------------+ PERO     Full                                                        +---------+---------------+---------+-----------+----------+--------------+ Gastroc  Full                                                        +---------+---------------+---------+-----------+----------+--------------+  Summary: RIGHT: - There is no evidence of deep vein thrombosis in the lower extremity.  - No cystic structure found in the popliteal fossa.  LEFT: - There is no evidence of deep vein thrombosis in the lower extremity.  - No cystic structure found in the popliteal fossa.  *See table(s) above for measurements and observations. Electronically signed by Orlie Pollen on 07/14/2021 at 5:23:19 PM.    Final      Medical Consultants:   None.   Subjective:    Kevin [name] relates his breathing is significantly better compared to yesterday.  Objective:    Vitals:   07/15/21 0114 07/15/21 0309 07/15/21 0500 07/15/21 0820  BP: (!) 147/95 128/70    Pulse: 64 (!) 55    Resp:  16    Temp:  (!) 97.5 F (36.4 C)    TempSrc:  Oral    SpO2:  98%  97%  Weight:   69.2 kg    Height:       SpO2: 97 %   Intake/Output Summary (Last 24 hours) at 07/15/2021 1038 Last data filed at 07/15/2021 0800 Gross per 24 hour  Intake 528 ml  Output 2000 ml  Net -1472 ml   Filed Weights   07/13/21 2143 07/14/21 0447 07/15/21 0500  Weight: 74.8 kg 69.1 kg 69.2 kg    Exam: General exam: In no acute distress. Respiratory system: Good air movement and clear to auscultation. Cardiovascular system: S1 & S2 heard, RRR.  Positive hepatojugular reflux Gastrointestinal system: Abdomen is nondistended, soft and nontender.  Extremities: No pedal edema. Skin: No rashes, lesions or ulcers Psychiatry: Judgement and insight appear normal. Mood & affect appropriate.    Data Reviewed:    Labs: Basic Metabolic Panel: Recent Labs  Lab 07/09/21 1215 07/10/21 0841 07/13/21 2205 07/14/21 0703 07/15/21 0507  NA 140 141 136 137 138  K 4.2 3.7 3.8 4.4 4.0  CL 110 108 109 107 108  CO2 24  --  21* 19* 23  GLUCOSE 86 88 113* 256* 104*  BUN 31* 23 25* 25* 38*  CREATININE 2.64* 1.90* 2.10* 2.03* 2.51*  CALCIUM 8.6*  --  8.6* 9.0 8.9  MG  --   --   --  2.3  --    GFR Estimated Creatinine Clearance: 29.5 mL/min (A) (by C-G formula based on SCr of 2.51 mg/dL (H)). Liver Function Tests: Recent Labs  Lab 07/09/21 1215 07/14/21 0703  AST 15 23  ALT 16 14  ALKPHOS 50 64  BILITOT 0.5 0.7  PROT 6.6 7.9  ALBUMIN 2.9* 3.5   Recent Labs  Lab 07/09/21 1215  LIPASE 60*   No results for input(s): AMMONIA in the last 168 hours. Coagulation profile Recent Labs  Lab 07/14/21 0245  INR 1.2   COVID-19 Labs  No results for input(s): DDIMER, FERRITIN, LDH, CRP in the last 72 hours.  Lab Results  Component Value Date   SARSCOV2NAA NEGATIVE 07/13/2021   SARSCOV2NAA RESULT: NEGATIVE 02/08/2021   SARSCOV2NAA NEGATIVE 11/30/2020   [place] NEGATIVE 06/07/2019    CBC: Recent Labs  Lab 07/09/21 1215 07/10/21 0841 07/13/21 2205 07/15/21 0507  WBC 8.9  --  11.8* 13.2*   NEUTROABS 5.7  --   --   --   HGB 9.6* 9.2* 10.5* 9.6*  HCT 28.9* 27.0* 30.3* 28.7*  MCV 96.0  --  94.1 94.4  PLT 317  --  393 391   Cardiac Enzymes: Recent Labs  Lab 07/09/21 1215  CKTOTAL  76   BNP (last 3 results) No results for input(s): PROBNP in the last 8760 hours. CBG: No results for input(s): GLUCAP in the last 168 hours. D-Dimer: No results for input(s): DDIMER in the last 72 hours. Hgb A1c: Recent Labs    07/14/21 0841  HGBA1C 5.4   Lipid Profile: No results for input(s): CHOL, HDL, LDLCALC, TRIG, CHOLHDL, LDLDIRECT in the last 72 hours. Thyroid function studies: Recent Labs    07/14/21 0841  TSH 0.511   Anemia work up: No results for input(s): VITAMINB12, FOLATE, FERRITIN, TIBC, IRON, RETICCTPCT in the last 72 hours. Sepsis Labs: Recent Labs  Lab 07/09/21 1215 07/13/21 2205 07/15/21 0507  WBC 8.9 11.8* 13.2*   Microbiology Recent Results (from the past 240 hour(s))  Urine Culture     Status: Abnormal   Collection Time: 07/09/21  1:00 PM   Specimen: Urine, Clean Catch  Result Value Ref Range Status   Specimen Description URINE, CLEAN CATCH  Final   Special Requests NONE  Final   Culture (A)  Final    <10,000 COLONIES/mL INSIGNIFICANT GROWTH Performed at [facility], [address], [place], [place] [address]    Report Status 07/10/2021 FINAL  Final  SARS Coronavirus 2 by RT PCR (hospital order, performed in [facility] hospital lab) *cepheid single result test* Anterior Nasal Swab     Status: None   Collection Time: 07/13/21 11:17 PM   Specimen: Anterior Nasal Swab  Result Value Ref Range Status   SARS Coronavirus 2 by RT PCR NEGATIVE NEGATIVE Final    Comment: (NOTE) SARS-CoV-2 target nucleic acids are NOT DETECTED.  The SARS-CoV-2 RNA is generally detectable in upper and lower respiratory specimens during the acute phase of infection. The lowest concentration of SARS-CoV-2 viral copies this assay can detect is 250 copies / mL. A  negative result does not preclude SARS-CoV-2 infection and should not be used as the sole basis for treatment or other patient management decisions.  A negative result may occur with improper specimen collection / handling, submission of specimen other than nasopharyngeal swab, presence of viral mutation(s) within the areas targeted by this assay, and inadequate number of viral copies (<250 copies / mL). A negative result must be combined with clinical observations, patient history, and epidemiological information.  Fact Sheet for Patients:   https://www.patel.info/  Fact Sheet for Healthcare Providers: https://hall.com/  This test is not yet approved or  cleared by the [place] FDA and has been authorized for detection and/or diagnosis of SARS-CoV-2 by FDA under an Emergency Use Authorization (EUA).  This EUA will remain in effect (meaning this test can be used) for the duration of the COVID-19 declaration under Section 564(b)(1) of the Act, 21 U.S.C. section 360bbb-3(b)(1), unless the authorization is terminated or revoked sooner.  Performed at [facility], [place] [address] [place], [place] [address]   MRSA Next Gen by PCR, Nasal     Status: None   Collection Time: 07/14/21  4:36 AM   Specimen: Nasal Mucosa; Nasal Swab  Result Value Ref Range Status   MRSA by PCR Next Gen NOT DETECTED NOT DETECTED Final    Comment: (NOTE) The GeneXpert MRSA Assay (FDA approved for NASAL specimens only), is one component of a comprehensive MRSA colonization surveillance program. It is not intended to diagnose MRSA infection nor to guide or monitor treatment for MRSA infections. Test performance is not FDA approved in patients less than 26 years old. Performed at [facility], [place] [address] [place], [place] [address]      Medications:    amLODipine  2.5 mg Oral Daily   aspirin EC  81 mg Oral Daily    carvedilol  6.25 mg Oral BID WC   dolutegravir  50 mg Oral QHS   And   lamiVUDine  300 mg Oral QHS   escitalopram  20 mg Oral Daily   feeding supplement  237 mL Oral BID BM   furosemide  40 mg Oral BID   ipratropium-albuterol  3 mL Nebulization BID   irbesartan  300 mg Oral Daily   isosorbide-hydrALAZINE  1 tablet Oral TID   mouth rinse  15 mL Mouth Rinse BID   sodium chloride flush  3 mL Intravenous Q12H   Continuous Infusions:  sodium chloride        LOS: 1 day   Charlynne Cousins  [facility]  07/15/2021, 10:38 AM

## 2021-07-15 NOTE — Progress Notes (Addendum)
Patient reported shortness of breath and tightness. VS taken, SPO97%RA, BP126/71. Patient's RR 18, no use of accessory muscles or inability to communicate needs. Albuterol nebulizer PRN provided to patient. Nurse at bedside during treatment. Patient reports no improvement with breathing treatment and chest tightness is still present. MD notified. New order: STAT EKG. Labs, Nitroglycerin SL given x3. MD at bedside.

## 2021-07-15 NOTE — Progress Notes (Signed)
Pt refused cpap tonight. Machine remained bedside.  

## 2021-07-15 NOTE — Progress Notes (Signed)
Came to bedside as nurse reported the patient was complaining of shortness of breath and chest tightening he was satting 96% on room air blood pressure 116/76 with a heart rate of 89. Twelve-lead EKG was done that showed ST segment depression in inferior leads and reciprocal changes in V3 4 and 5. We will order a stat 2D echo, continued him on aspirin and beta-blockers. He was started on IV heparin and statins. We will cycle cardiac markers.  It is to note that his cardiac markers are chronically elevated Cardiology has been consulted for possible ACS.

## 2021-07-15 NOTE — TOC Progression Note (Signed)
Transition of Care [facility]) - Progression Note    Patient Details  Name: DAVEON ARPINO MRN: [id number] Date of Birth: 11-09-1955  [facility] CM/SW Contact  Purcell Mouton, RN Phone Number: 07/15/2021, 10:47 AM  Clinical Narrative:    Pt from home and plan to return. There are no HH needs at present time.    Expected Discharge Plan: Home/Self Care Barriers to Discharge: No Barriers Identified  Expected Discharge Plan and Services Expected Discharge Plan: Home/Self Care       Living arrangements for the past 2 months: Single Family Home                                       Social Determinants of Health (SDOH) Interventions    Readmission Risk Interventions     View : No data to display.

## 2021-07-15 NOTE — Consult Note (Signed)
Cardiology Consultation:   Patient ID: KO BARDON MRN: [id number]; DOB: [birth year]  Admit date: 07/13/2021 Date of Consult: 07/15/2021  PCP:  Kevin Lei, [name]   [facility] Providers Cardiologist:  Kevin Casino, [name]        Patient Profile:   Kevin Kevin is a 66 y.o. male with a hx of hypertension, chronic kidney disease stage 3b, renal cell carcinoma s/p ablation, PAD, OSA on bipap, HIV with undetectable viral load who is being seen 07/15/2021 for the evaluation of chest pain at the request of Dr. Aileen [name].  History of Present Illness:   Kevin Kevin was admitted 6.1.23 with hypertensive crisis and shortness of breath. BP as high as 234/133. His blood pressure is now much improved with readdition and titration of medications. There was question of PE, had indeterminate VQ scan, placed on heparin and then heparin stopped when dopplers negative.  Patient has difficulty describing his symptoms. On multiple interrogations, he denies chest pain. He reports it as feeling like he can't get enough breath in. This afternoon he was on the phone with a friend and told them he felt short of breath. The friend recommended he call the nurse. Nurse and attending came to bedside, per report patient had shortness of breath and chest pain (though patient says no chest discomfort). Patient states he may have had some improvement in his breathing after three nitro but it is difficult for him to quantify. ECG with LVH with repol changes similar to prior, but there was some concern for ST elevation. Cardiology called, and I came to see patient urgently.  As above, he has trouble describing his symptoms. Denies nausea, diaphoresis. States shortness of breath is different than what he has at home--that is improved with albuterol, where this has not been significantly improved with any treatment. He can take a deep breath and hold it for me, but he just feels like he isn't getting good breaths. He denies  chest heaviness or pain, says it may feel a little tight with breathing some times.   Denies PND, orthopnea, LE edema or unexpected weight gain. No syncope or palpitations.    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    Past Surgical History:  Procedure Laterality Date   IR RADIOLOGIST EVAL & MGMT  05/31/2021   LEFT HEART CATH AND CORONARY ANGIOGRAPHY N/A 10/23/2016   Procedure: LEFT HEART CATH AND CORONARY ANGIOGRAPHY;  Surgeon: Kevin Kevin;  Location: [place] [facility];  Service: Cardiovascular;  Laterality: N/A;   LOWER EXTREMITY ANGIOGRAPHY N/A 07/17/2016   Procedure: Lower Extremity Angiography;  Surgeon: Kevin Kevin;  Location: [place] [facility];  Service: Cardiovascular;  Laterality: N/A;   LOWER EXTREMITY INTERVENTION N/A 06/05/2016   Procedure: Lower Extremity Intervention;  Surgeon: Kevin Kevin;  Location: [place] [facility];  Service: Cardiovascular;  Laterality: N/A;   PERIPHERAL VASCULAR ATHERECTOMY Right 07/17/2016   Procedure: Peripheral Vascular Atherectomy;  Surgeon: Kevin Kevin;  Location: [place] [facility];  Service: Cardiovascular;  Laterality: Right;  SFA   PERIPHERAL VASCULAR INTERVENTION  06/05/2016   Procedure: Peripheral Vascular Intervention;  Surgeon: Kevin Kevin;  Location: [place] [facility];  Service: Cardiovascular;;  left SFA   RADIOLOGY WITH  ANESTHESIA Right 06/29/2021   Procedure: RIGHT RENAL CRYOABLATION;  Surgeon: Kevin Kevin;  Location: [facility];  Service: Anesthesiology;  Laterality: Right;     Home Medications:  Prior to Admission medications   Medication Sig Start Date End Date Taking? Authorizing Provider  albuterol (PROVENTIL) (2.5 MG/3ML) 0.083% nebulizer solution Take 3 mLs (2.5 mg total) by nebulization every 4 (four) hours as needed for wheezing or shortness of breath. Patient taking differently: Take 5 mg by nebulization daily as needed for wheezing or shortness of breath. 05/13/21  Yes Kevin Kevin  albuterol (VENTOLIN HFA) 108 (90 Base) MCG/ACT inhaler Inhale 2 puffs into the lungs every 6 (six) hours as needed for wheezing or shortness of breath. Patient taking differently: Inhale 2 puffs into the lungs 2 (two) times daily as needed for wheezing or shortness of breath. 04/19/21  Yes Raspet, Kevin Kevin  dolutegravir-lamiVUDine (DOVATO) 50-300 MG tablet Take 1 tablet by mouth daily. 06/15/21  Yes Kevin Kevin  escitalopram (LEXAPRO) 10 MG tablet Take 20 mg by mouth daily. 06/16/21  Yes Provider, Historical, [name]  Multiple Vitamin (MULTIVITAMIN) tablet Take 1 tablet by mouth daily.   Yes Provider, Historical, [name]  valsartan-hydrochlorothiazide (DIOVAN-HCT) 80-12.5 MG tablet Take 1 tablet by mouth daily. 06/09/21  Yes Provider, Historical, [name]  morphine (MSIR) 15 MG tablet Take 1 tablet (15 mg total) by mouth every 6 (six) hours as needed for severe pain. Patient not taking: Reported on 07/13/2021 07/10/21   Kevin Kevin    Inpatient Medications: Scheduled Meds:  amLODipine  2.5 mg Oral Daily   aspirin EC  81 mg Oral Daily   atorvastatin  40 mg Oral Daily   carvedilol  6.25 mg Oral BID WC   dolutegravir  50 mg Oral QHS   And   lamiVUDine  300 mg Oral QHS   escitalopram  20 mg Oral Daily   feeding supplement  1 Container Oral BID BM   ipratropium-albuterol  3 mL Nebulization BID   irbesartan   300 mg Oral Daily   isosorbide-hydrALAZINE  1 tablet Oral TID   [START ON 07/16/2021] isosorbide-hydrALAZINE  1 tablet Oral TID   mouth rinse  15 mL Mouth Rinse BID   multivitamin with minerals  1 tablet Oral Daily   sodium chloride flush  3 mL Intravenous Q12H   Continuous Infusions:  sodium chloride     heparin 1,200 Units/hr (07/15/21 1634)   PRN Meds: sodium chloride, acetaminophen **OR** acetaminophen, albuterol, hydrALAZINE, naLOXone (NARCAN)  injection, nitroGLYCERIN, ondansetron (ZOFRAN) IV, sodium chloride flush  Allergies:   No Known Allergies  Social History:   Social History   Socioeconomic History   Marital status: Significant Other    Spouse name: Not on file   Number of children: Not on file   Years of education: Not on file   Highest education level: Not on file  Occupational History   Not on file  Tobacco Use   Smoking status: Every Day    Packs/day: 0.10    Years: 42.00  Pack years: 4.20    Types: Cigarettes, E-cigarettes   Smokeless tobacco: Never   Tobacco comments:    1-2 cigarettes a day; vaping  Vaping Use   Vaping Use: Every day   Substances: Nicotine, Flavoring  Substance and Sexual Activity   Alcohol use: Not Currently    Alcohol/week: 2.0 standard drinks    Types: 2 Cans of beer per week    Comment: rare   Drug use: Not Currently    Comment: rare   Sexual activity: Not Currently    Comment: declined condoms  Other Topics Concern   Not on file  Social History Narrative   Not on file   Social Determinants of Health   Financial Resource Strain: Not on file  Food Insecurity: Not on file  Transportation Needs: Not on file  Physical Activity: Not on file  Stress: Not on file  Social Connections: Not on file  Intimate Partner Violence: Not on file    Family History:    Family History  Problem Relation Age of Onset   High blood pressure Mother    Lupus Mother      ROS:  Please see the history of present illness.   Constitutional: Negative for chills, fever, night sweats, unintentional weight loss  HENT: Negative for ear pain and hearing loss.   Eyes: Negative for loss of vision and eye pain.  Respiratory: Negative for cough, sputum, wheezing.   Cardiovascular: See HPI. Gastrointestinal: Negative for abdominal pain, melena, and hematochezia.  Genitourinary: Negative for dysuria and hematuria.  Musculoskeletal: Negative for falls and myalgias.  Skin: Negative for itching and rash.  Neurological: Negative for focal weakness, focal sensory changes and loss of consciousness.  Endo/Heme/Allergies: Does not bruise/bleed easily.   All other ROS reviewed and negative.     Physical Exam/Data:   Vitals:   07/15/21 1548 07/15/21 1555 07/15/21 1556 07/15/21 1603  BP: 116/76 118/67 120/82 119/71  Pulse: (!) 59 61 65 65  Resp:      Temp:      TempSrc:      SpO2: 96% 98% 97% 96%  Weight:      Height:        Intake/Output Summary (Last 24 hours) at 07/15/2021 1725 Last data filed at 07/15/2021 1430 Gross per 24 hour  Intake 600 ml  Output 820 ml  Net -220 ml      07/15/2021    5:00 AM 07/14/2021    4:47 AM 07/13/2021    9:43 PM  Last 3 Weights  Weight (lbs) 152 lb 8.9 oz 152 lb 5.4 oz 165 lb  Weight (kg) 69.2 kg 69.1 kg 74.844 kg     Body mass index is 20.69 kg/m.  General:  Well nourished, well developed, in no acute distress HEENT: normal Neck: no JVD Vascular: No carotid bruits; Distal pulses 2+ bilaterally Cardiac:  normal S1, S2; RRR; no murmur  Lungs:  clear to auscultation bilaterally, no wheezing, rhonchi or rales  Abd: soft, nontender, no hepatomegaly  Ext: no edema Musculoskeletal:  No deformities, BUE and BLE strength normal and equal Skin: warm and dry  Neuro:  CNs 2-12 intact, no focal abnormalities noted Psych:  Normal affect   EKG:  The EKG was personally reviewed and demonstrates:  sinus rhythm, LVH with repol Telemetry:  Telemetry was personally reviewed and demonstrates:   sinus rhythm, no significant arrhythmias  Relevant CV Studies:  1. Left ventricular ejection fraction, by estimation, is 55 to 60%. The  left ventricle  has normal function. The left ventricle has no regional  wall motion abnormalities. There is mild concentric left ventricular  hypertrophy. Left ventricular diastolic  parameters are consistent with Grade I diastolic dysfunction (impaired  relaxation).   2. Right ventricular systolic function is normal. The right ventricular  size is normal.   3. The mitral valve is normal in structure. No evidence of mitral valve  regurgitation. No evidence of mitral stenosis.   4. The aortic valve is normal in structure. Aortic valve regurgitation is  not visualized. No aortic stenosis is present.   5. The inferior vena cava is normal in size with greater than 50%  respiratory variability, suggesting right atrial pressure of 3 mmHg.   Laboratory Data:  High Sensitivity Troponin:   Recent Labs  Lab 07/13/21 2315 07/14/21 0110 07/15/21 1553  TROPONINIHS 50* 46* 38*     Chemistry Recent Labs  Lab 07/13/21 2205 07/14/21 0703 07/15/21 0507  NA 136 137 138  [name] 3.8 4.4 4.0  CL 109 107 108  CO2 21* 19* 23  GLUCOSE 113* 256* 104*  BUN 25* 25* 38*  CREATININE 2.10* 2.03* 2.51*  CALCIUM 8.6* 9.0 8.9  MG  --  2.3  --   GFRNONAA 35* 36* 28*  ANIONGAP '6 11 7    '$ Recent Labs  Lab 07/09/21 1215 07/14/21 0703  PROT 6.6 7.9  ALBUMIN 2.9* 3.5  AST 15 23  ALT 16 14  ALKPHOS 50 64  BILITOT 0.5 0.7   Lipids No results for input(s): CHOL, TRIG, HDL, LABVLDL, LDLCALC, CHOLHDL in the last 168 hours.  Hematology Recent Labs  Lab 07/09/21 1215 07/10/21 0841 07/13/21 2205 07/15/21 0507  WBC 8.9  --  11.8* 13.2*  RBC 3.01*  --  3.22* 3.04*  HGB 9.6* 9.2* 10.5* 9.6*  HCT 28.9* 27.0* 30.3* 28.7*  MCV 96.0  --  94.1 94.4  MCH 31.9  --  32.6 31.6  MCHC 33.2  --  34.7 33.4  RDW 15.5  --  15.8* 15.9*  PLT 317  --  393 391   Thyroid  Recent  Labs  Lab 07/14/21 0841  TSH 0.511    BNP Recent Labs  Lab 07/13/21 2315 07/14/21 0841  BNP 51.6 53.6    DDimer No results for input(s): DDIMER in the last 168 hours.   Radiology/Studies:  DG Chest 2 View  Result Date: 07/13/2021 CLINICAL DATA:  Shortness of breath EXAM: CHEST - 2 VIEW COMPARISON:  06/29/2021 FINDINGS: The heart size and mediastinal contours are within normal limits. Both lungs are clear. The visualized skeletal structures are unremarkable. IMPRESSION: Negative. Electronically Signed   By: Rolm Baptise M.D.   On: 07/13/2021 22:06   NM Pulmonary Perfusion  Result Date: 07/14/2021 CLINICAL DATA:  Chest pain, shortness of breath EXAM: NUCLEAR MEDICINE PERFUSION LUNG SCAN TECHNIQUE: Perfusion images were obtained in multiple projections after intravenous injection of radiopharmaceutical. Ventilation scans intentionally deferred if perfusion scan and chest x-ray adequate for interpretation during COVID 19 epidemic. RADIOPHARMACEUTICALS:  4.03 mCi Tc-66mMAA IV COMPARISON:  Chest radiograph done on 07/13/2021 FINDINGS: There are ill-defined small foci of decreased perfusion in the posterior left mid and left lower lung fields. There is moderate sized area of decreased perfusion in the posterior right lower lung fields. There are no definite wedge-shaped perfusion defects. Lung fields are essentially clear in the previous chest radiographs. IMPRESSION: There are small to moderate sized foci of decreased tracer uptake in the posterior left mid and left lower  lung fields and posterior right lower lung fields. Study is indeterminate to evaluate for pulmonary embolism. If there is continued clinical suspicion for PE follow-up CT angiogram and/or venous Doppler examination of both lower extremities may be considered. Electronically Signed   By: Elmer Picker M.D.   On: 07/14/2021 12:44   CT Renal Stone Study  Result Date: 07/14/2021 CLINICAL DATA:  Right flank pain EXAM: CT ABDOMEN  AND PELVIS WITHOUT CONTRAST TECHNIQUE: Multidetector CT imaging of the abdomen and pelvis was performed following the standard protocol without IV contrast. RADIATION DOSE REDUCTION: This exam was performed according to the departmental dose-optimization program which includes automated exposure control, adjustment of the mA and/or kV according to patient size and/or use of iterative reconstruction technique. COMPARISON:  07/09/2021, MRI 05/14/2021 FINDINGS: Lower chest: No acute abnormality Hepatobiliary: No focal hepatic abnormality. Gallbladder unremarkable. Pancreas: No focal abnormality or ductal dilatation. Spleen: No focal abnormality.  Normal size. Adrenals/Urinary Tract: Low-density lesion again noted posteriorly in the midpole of the right kidney, unchanged since recent study. This has enlarged since prior MRI from 05/14/2021. This recently has undergone biopsy and cryoablation, likely the cause of the increased size. No visible surrounding hematoma. No renal or ureteral stones. No hydronephrosis. Adrenal glands and urinary bladder unremarkable. Stomach/Bowel: Normal appendix. Stomach, large and small bowel grossly unremarkable. Vascular/Lymphatic: No evidence of aneurysm or adenopathy. Scattered aortic calcifications. Reproductive: No visible focal abnormality. Other: No free fluid or free air. Musculoskeletal: No acute bony abnormality. IMPRESSION: 3.8 cm low-density lesion posteriorly in the midpole of the right kidney which cannot be characterized on this noncontrast study. This appears significantly larger when compared to prior MRI. This has recently undergone biopsy and cryoablation, likely the cause of the apparent increased size of the lesion. No visible complicating feature. No renal or ureteral stones.  No hydronephrosis. Aortic atherosclerosis. Electronically Signed   By: Rolm Baptise M.D.   On: 07/14/2021 00:44   VAS [name] LOWER EXTREMITY VENOUS (DVT)  Result Date: 07/14/2021  Lower Venous DVT  Study Patient Name:  RIYAD KEENA  Date of Exam:   07/14/2021 Medical Rec #: [id number]        Accession #:    [id number] Date of Birth: 1955-11-21        Patient Gender: M Patient Age:   6 years Exam Location:  [facility] Procedure:      VAS [name] LOWER EXTREMITY VENOUS (DVT) Referring Phys: Bess Harvest ORTIZ --------------------------------------------------------------------------------  Indications: SOB, and Swelling.  Anticoagulation: Heparin. Comparison Study: 02-18-2016 Prior bilateral lower extremity venous was negative                   for DVT. Multiple prior arterial examinations. Performing Technologist: Darlin Coco RDMS, RVT  Examination Guidelines: A complete evaluation includes B-mode imaging, spectral Doppler, color Doppler, and power Doppler as needed of all accessible portions of each vessel. Bilateral testing is considered an integral part of a complete examination. Limited examinations for reoccurring indications may be performed as noted. The reflux portion of the exam is performed with the patient in reverse Trendelenburg.  +---------+---------------+---------+-----------+----------+--------------+ RIGHT    CompressibilityPhasicitySpontaneityPropertiesThrombus Aging +---------+---------------+---------+-----------+----------+--------------+ CFV      Full           Yes      Yes                                 +---------+---------------+---------+-----------+----------+--------------+ SFJ  Full                                                        +---------+---------------+---------+-----------+----------+--------------+ FV Prox  Full                                                        +---------+---------------+---------+-----------+----------+--------------+ FV Mid   Full                                                        +---------+---------------+---------+-----------+----------+--------------+ FV DistalFull                                                         +---------+---------------+---------+-----------+----------+--------------+ PFV      Full                                                        +---------+---------------+---------+-----------+----------+--------------+ POP      Full           Yes      Yes                                 +---------+---------------+---------+-----------+----------+--------------+ PTV      Full                                                        +---------+---------------+---------+-----------+----------+--------------+ PERO     Full                                                        +---------+---------------+---------+-----------+----------+--------------+ Gastroc  Full                                                        +---------+---------------+---------+-----------+----------+--------------+   +---------+---------------+---------+-----------+----------+--------------+ LEFT     CompressibilityPhasicitySpontaneityPropertiesThrombus Aging +---------+---------------+---------+-----------+----------+--------------+ CFV      Full           Yes      Yes                                 +---------+---------------+---------+-----------+----------+--------------+  SFJ      Full                                                        +---------+---------------+---------+-----------+----------+--------------+ FV Prox  Full                                                        +---------+---------------+---------+-----------+----------+--------------+ FV Mid   Full                                                        +---------+---------------+---------+-----------+----------+--------------+ FV DistalFull                                                        +---------+---------------+---------+-----------+----------+--------------+ PFV      Full                                                         +---------+---------------+---------+-----------+----------+--------------+ POP      Full           Yes      Yes                                 +---------+---------------+---------+-----------+----------+--------------+ PTV      Full                                                        +---------+---------------+---------+-----------+----------+--------------+ PERO     Full                                                        +---------+---------------+---------+-----------+----------+--------------+ Gastroc  Full                                                        +---------+---------------+---------+-----------+----------+--------------+     Summary: RIGHT: - There is no evidence of deep vein thrombosis in the lower extremity.  - No cystic structure found in the popliteal fossa.  LEFT: - There is no evidence of deep vein thrombosis in the lower extremity.  - No cystic structure found in the popliteal  fossa.  *See table(s) above for measurements and observations. Electronically signed by Orlie Pollen on 07/14/2021 at 5:23:19 PM.    Final      Assessment and Plan:   Shortness of breath Abnormal ECG ?Chest discomfort -I spoke with Dr. Aileen [name], who offered additional history. It is very difficult to get a sense of actual symptoms from the patient -he appears comfortable on my exam, not in acute distress -I reviewed multiple ECGs. He has a similar pattern of inferior ST depressions and LVH elevations. Transition for this is around V3-V4 depending on lead placement. I repeated an ECG at bedside as well, not consistent with STEMI -Reasonable to repeat echo this admission -he has been started on heparin--though I have a low suspicion this is ACS, reasonable to use heparin while trending troponins  Chronically elevated troponin -updated to add: repeat troponin down from prior (within range of his chronic elevation). 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 He understands and agrees.   Risk Assessment/Risk Scores:     HEAR Score (for undifferentiated chest pain):           For questions or updates, please contact [place] Please consult [email or web address] for contact info under    Signed, Buford Dresser, [name]  07/15/2021 5:25 PM

## 2021-07-16 ENCOUNTER — Inpatient Hospital Stay (HOSPITAL_COMMUNITY): Payer: Medicaid Other

## 2021-07-16 DIAGNOSIS — R0600 Dyspnea, unspecified: Secondary | ICD-10-CM

## 2021-07-16 DIAGNOSIS — J441 Chronic obstructive pulmonary disease with (acute) exacerbation: Secondary | ICD-10-CM

## 2021-07-16 DIAGNOSIS — I169 Hypertensive crisis, unspecified: Secondary | ICD-10-CM | POA: Diagnosis not present

## 2021-07-16 DIAGNOSIS — R0609 Other forms of dyspnea: Secondary | ICD-10-CM

## 2021-07-16 DIAGNOSIS — I5031 Acute diastolic (congestive) heart failure: Secondary | ICD-10-CM | POA: Diagnosis not present

## 2021-07-16 DIAGNOSIS — R778 Other specified abnormalities of plasma proteins: Secondary | ICD-10-CM | POA: Diagnosis not present

## 2021-07-16 LAB — BASIC METABOLIC PANEL
Anion gap: 7 (ref 5–15)
BUN: 59 mg/dL — ABNORMAL HIGH (ref 8–23)
CO2: 23 mmol/L (ref 22–32)
Calcium: 8.7 mg/dL — ABNORMAL LOW (ref 8.9–10.3)
Chloride: 106 mmol/L (ref 98–111)
Creatinine, Ser: 3.04 mg/dL — ABNORMAL HIGH (ref 0.61–1.24)
GFR, Estimated: 22 mL/min — ABNORMAL LOW (ref >60)
Glucose, Bld: 92 mg/dL (ref 70–99)
Potassium: 3.8 mmol/L (ref 3.5–5.1)
Sodium: 136 mmol/L (ref 135–145)

## 2021-07-16 LAB — ECHOCARDIOGRAM COMPLETE
AR max vel: 3.39 cm2
AV Peak grad: 4.8 mmHg
Ao pk vel: 1.09 m/s
Area-P 1/2: 3.45 cm2
Calc EF: 64 %
Height: 72 in
S' Lateral: 3 cm
Single Plane A2C EF: 61.8 %
Single Plane A4C EF: 65 %
Weight: 2363.33 oz

## 2021-07-16 LAB — CBC
HCT: 30.6 % — ABNORMAL LOW (ref 39.0–52.0)
Hemoglobin: 10.2 g/dL — ABNORMAL LOW (ref 13.0–17.0)
MCH: 31.8 pg (ref 26.0–34.0)
MCHC: 33.3 g/dL (ref 30.0–36.0)
MCV: 95.3 fL (ref 80.0–100.0)
Platelets: 439 10*3/uL — ABNORMAL HIGH (ref 150–400)
RBC: 3.21 MIL/uL — ABNORMAL LOW (ref 4.22–5.81)
RDW: 16 % — ABNORMAL HIGH (ref 11.5–15.5)
WBC: 10.5 10*3/uL (ref 4.0–10.5)
nRBC: 0 % (ref 0.0–0.2)

## 2021-07-16 LAB — TROPONIN I (HIGH SENSITIVITY): Troponin I (High Sensitivity): 39 ng/L — ABNORMAL HIGH (ref <18)

## 2021-07-16 LAB — HEPARIN LEVEL (UNFRACTIONATED): Heparin Unfractionated: 0.43 IU/mL (ref 0.30–0.70)

## 2021-07-16 MED ORDER — IPRATROPIUM BROMIDE HFA 17 MCG/ACT IN AERS
2.0000 | INHALATION_SPRAY | RESPIRATORY_TRACT | Status: DC
  Filled 2021-07-16: qty 12.9

## 2021-07-16 MED ORDER — DOLUTEGRAVIR SODIUM 50 MG PO TABS
50.0000 mg | ORAL_TABLET | Freq: Every day | ORAL | Status: DC
  Administered 2021-07-16: 50 mg via ORAL
  Filled 2021-07-16: qty 1

## 2021-07-16 MED ORDER — SODIUM CHLORIDE 0.9 % IV BOLUS
250.0000 mL | Freq: Once | INTRAVENOUS | Status: AC
  Administered 2021-07-16: 250 mL via INTRAVENOUS

## 2021-07-16 MED ORDER — LAMIVUDINE 150 MG PO TABS
150.0000 mg | ORAL_TABLET | Freq: Every day | ORAL | Status: DC
  Administered 2021-07-16: 150 mg via ORAL
  Filled 2021-07-16: qty 1

## 2021-07-16 MED ORDER — IPRATROPIUM-ALBUTEROL 0.5-2.5 (3) MG/3ML IN SOLN
3.0000 mL | Freq: Three times a day (TID) | RESPIRATORY_TRACT | Status: DC
  Administered 2021-07-16 – 2021-07-17 (×3): 3 mL via RESPIRATORY_TRACT
  Filled 2021-07-16 (×3): qty 3

## 2021-07-16 MED ORDER — METHYLPREDNISOLONE SODIUM SUCC 40 MG IJ SOLR
40.0000 mg | Freq: Two times a day (BID) | INTRAMUSCULAR | Status: DC
  Administered 2021-07-16 (×2): 40 mg via INTRAVENOUS
  Filled 2021-07-16 (×2): qty 1

## 2021-07-16 MED ORDER — FLUTICASONE FUROATE-VILANTEROL 200-25 MCG/ACT IN AEPB
1.0000 | INHALATION_SPRAY | Freq: Every day | RESPIRATORY_TRACT | Status: DC
  Administered 2021-07-16 – 2021-07-17 (×2): 1 via RESPIRATORY_TRACT
  Filled 2021-07-16: qty 28

## 2021-07-16 NOTE — Progress Notes (Signed)
   07/16/21 1608  Vitals  BP 107/67  Pulse Rate 67  MEWS COLOR  MEWS Score Color Green  MEWS Score  MEWS Temp 0  MEWS Systolic 0  MEWS Pulse 0  MEWS RR 0  MEWS LOC 0  MEWS Score 0    Dr, Olevia Bowens notified that patient refused isosorbide and carvedilol.  Patient educated on medications an MD is aware.

## 2021-07-16 NOTE — Progress Notes (Signed)
[place] for heparin Indication: chest pain/ACS  No Known Allergies  Patient Measurements: Height: 6' (182.9 cm) Weight: 69.2 kg (152 lb 8.9 oz) IBW/kg (Calculated) : 77.6 Heparin Dosing Weight: 69.2 kg  Vital Signs: Temp: 98.9 F (37.2 C) (06/02 2005) Temp Source: Oral (06/02 2005) BP: 121/75 (06/02 2204) Pulse Rate: 65 (06/02 2204)  Labs: Recent Labs    07/13/21 2205 07/13/21 2315 07/14/21 0110 07/14/21 0245 07/14/21 0703 07/14/21 0841 07/15/21 0507 07/15/21 1553 07/15/21 1722 07/15/21 2308  HGB 10.5*  --   --   --   --   --  9.6*  --   --   --   HCT 30.3*  --   --   --   --   --  28.7*  --   --   --   PLT 393  --   --   --   --   --  391  --   --   --   APTT  --   --   --  33  --   --   --   --   --   --   LABPROT  --   --   --  14.6  --   --   --   --   --   --   INR  --   --   --  1.2  --   --   --   --   --   --   HEPARINUNFRC  --   --   --   --   --  0.52  --   --   --  0.51  CREATININE 2.10*  --   --   --  2.03*  --  2.51*  --   --   --   TROPONINIHS  --    < > 46*  --   --   --   --  38* 38*  --    < > = values in this interval not displayed.     Estimated Creatinine Clearance: 29.5 mL/min (A) (by C-G formula based on SCr of 2.51 mg/dL (H)).   Medical History: Past Medical History:  Diagnosis Date   AIDS (acquired immune deficiency syndrome) ([place]) 08/17/2016   Chronic diastolic CHF (congestive heart failure), NYHA class 3 (HCC) 01/2016   Chronic lower back pain    CKD (chronic kidney disease), stage III (HCC)    COPD (chronic obstructive pulmonary disease) (HCC)    Gout    "forearms, hands, ankles, feet" (06/05/2016)   Headache    "weekly" (06/05/2016)   Heart murmur    Hypertension    Hypertensive crisis 08/15/2017   OSA on CPAP    PAD (peripheral artery disease) (HCC)    PAF (paroxysmal atrial fibrillation) ([place]) 01/2016   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    Medications:  Scheduled:    amLODipine  2.5 mg Oral Daily   aspirin EC  81 mg Oral Daily   atorvastatin  40 mg Oral Daily   carvedilol  6.25 mg Oral BID WC   dolutegravir  50 mg Oral QHS   And   lamiVUDine  300 mg Oral QHS   escitalopram  20 mg Oral Daily   feeding supplement  1 Container Oral BID BM   ipratropium-albuterol  3 mL Nebulization BID   irbesartan  300 mg Oral Daily   isosorbide-hydrALAZINE  1 tablet Oral TID  isosorbide-hydrALAZINE  1 tablet Oral TID   mouth rinse  15 mL Mouth Rinse BID   multivitamin with minerals  1 tablet Oral Daily   sodium chloride flush  3 mL Intravenous Q12H    Assessment: 66 yo male admitted for hypertensive crisis and atypical shortness of breath and chest pain.  Was on IV heparin on 6/1 for questionable PE, however anticoagulation was d/c'd 6/1 PM due to indeterminate V/Q scan. Today, patient is reporting chest tightness and shortness of breath and pharmacy is consulted to re-dose heparin drip for new STEMI on EKG.    07/16/2021: Initial heparin level after resuming heparin @ 1200 units/hr is therapeutic (0.51) Troponin trending down, low suspicion for MI per cards notes No bleeding or infusion related concerns reported by RN  Goal of Therapy:  Heparin level 0.3-0.7 units/ml Monitor platelets by anticoagulation protocol: Yes   Plan:  Continue heparin drip at 1200 units/hr Check confirmatory heparin level at 0500 Monitor daily heparin level and CBC  Netta Cedars, PharmD 07/16/2021 12:14 AM

## 2021-07-16 NOTE — Progress Notes (Signed)
Pt refused cpap again tonight. Machine remained bedside if needed.  °

## 2021-07-16 NOTE — Progress Notes (Signed)
[place] for heparin Indication: chest pain/ACS  No Known Allergies  Patient Measurements: Height: 6' (182.9 cm) Weight: 67 kg (147 lb 11.3 oz) IBW/kg (Calculated) : 77.6 Heparin Dosing Weight: 69.2 kg  Vital Signs: Temp: 97.9 F (36.6 C) (06/03 0529) Temp Source: Oral (06/03 0529) BP: 132/85 (06/03 0529) Pulse Rate: 69 (06/03 0529)  Labs: Recent Labs    07/13/21 2205 07/13/21 2315 07/14/21 0245 07/14/21 0703 07/14/21 0841 07/15/21 0507 07/15/21 1553 07/15/21 1722 07/15/21 2308 07/15/21 2348 07/16/21 0559  HGB 10.5*  --   --   --   --  9.6*  --   --   --   --  10.2*  HCT 30.3*  --   --   --   --  28.7*  --   --   --   --  30.6*  PLT 393  --   --   --   --  391  --   --   --   --  439*  APTT  --   --  33  --   --   --   --   --   --   --   --   LABPROT  --   --  14.6  --   --   --   --   --   --   --   --   INR  --   --  1.2  --   --   --   --   --   --   --   --   HEPARINUNFRC  --   --   --   --  0.52  --   --   --  0.51  --  0.43  CREATININE 2.10*  --   --  2.03*  --  2.51*  --   --   --   --  3.04*  TROPONINIHS  --    < >  --   --   --   --  38* 38*  --  39*  --    < > = values in this interval not displayed.     Estimated Creatinine Clearance: 23.6 mL/min (A) (by C-G formula based on SCr of 3.04 mg/dL (H)).   Medical History: Past Medical History:  Diagnosis Date   AIDS (acquired immune deficiency syndrome) ([place]) 08/17/2016   Chronic diastolic CHF (congestive heart failure), NYHA class 3 (HCC) 01/2016   Chronic lower back pain    CKD (chronic kidney disease), stage III (HCC)    COPD (chronic obstructive pulmonary disease) (HCC)    Gout    "forearms, hands, ankles, feet" (06/05/2016)   Headache    "weekly" (06/05/2016)   Heart murmur    Hypertension    Hypertensive crisis 08/15/2017   OSA on CPAP    PAD (peripheral artery disease) (HCC)    PAF (paroxysmal atrial fibrillation) ([place]) 01/2016   Papillary renal cell  carcinoma (HCC) 06/15/2021    Medications:  Scheduled:   amLODipine  2.5 mg Oral Daily   aspirin EC  81 mg Oral Daily   atorvastatin  40 mg Oral Daily   carvedilol  6.25 mg Oral BID WC   dolutegravir  50 mg Oral QHS   And   lamiVUDine  300 mg Oral QHS   escitalopram  20 mg Oral Daily   feeding supplement  1 Container Oral BID BM   ipratropium-albuterol  3 mL Nebulization  BID   irbesartan  300 mg Oral Daily   isosorbide-hydrALAZINE  1 tablet Oral TID   mouth rinse  15 mL Mouth Rinse BID   multivitamin with minerals  1 tablet Oral Daily   sodium chloride flush  3 mL Intravenous Q12H    Assessment: 66 yo male admitted for hypertensive crisis and atypical shortness of breath and chest pain.  Was on IV heparin on 6/1 for questionable PE, however anticoagulation was d/c'd 6/1 PM due to indeterminate V/Q scan. Today, patient is reporting chest tightness and shortness of breath and pharmacy is consulted to re-dose heparin drip for new STEMI on EKG.    07/16/2021: Confirmatory heparin level with heparin @ 1200 units/hr remains therapeutic (0.43) Troponin trending down, low suspicion for MI per cards notes No bleeding or infusion related concerns reported by RN  Goal of Therapy:  Heparin level 0.3-0.7 units/ml Monitor platelets by anticoagulation protocol: Yes   Plan:  Continue heparin drip at 1200 units/hr Monitor daily heparin level, CBC, signs/symptoms of bleeding   Royetta Asal, PharmD, [place] Please utilize Amion for appropriate phone number to reach the unit pharmacist ([place]) 07/16/2021 9:01 AM

## 2021-07-16 NOTE — Progress Notes (Signed)
   07/16/21 1045  Vitals  BP (!) 84/58  MAP (mmHg) 68  BP Location Left Arm  BP Method Automatic  Patient Position (if appropriate) Sitting  Pulse Rate 67  Pulse Rate Source Dinamap  ECG Heart Rate 68  Resp 12  Level of Consciousness  Level of Consciousness Alert  MEWS COLOR  MEWS Score Color Yellow  Oxygen Therapy  SpO2 100 %  O2 Device Room Air  MEWS Score  MEWS Temp 0  MEWS Systolic 1  MEWS Pulse 0  MEWS RR 1  MEWS LOC 0  MEWS Score 2    Dr. Olevia Bowens notified.  Patient states " I feel dizzy".  Patient is alert and oriented, sitting up in chair talking on his phone.

## 2021-07-16 NOTE — Progress Notes (Signed)
Progress Note  Patient Name: Kevin [name] Date of Encounter: 07/16/2021  [facility] Cardiologist: [name], MD   Subjective   He has dyspnea with minimal movement. No chest pain. Can't lie flat 2/2 back pain.   Inpatient Medications    Scheduled Meds:  amLODipine  2.5 mg Oral Daily   aspirin EC  81 mg Oral Daily   atorvastatin  40 mg Oral Daily   carvedilol  6.25 mg Oral BID WC   dolutegravir  50 mg Oral QHS   And   lamiVUDine  300 mg Oral QHS   escitalopram  20 mg Oral Daily   feeding supplement  1 Container Oral BID BM   fluticasone furoate-vilanterol  1 puff Inhalation Daily   ipratropium-albuterol  3 mL Nebulization BID   irbesartan  300 mg Oral Daily   isosorbide-hydrALAZINE  1 tablet Oral TID   mouth rinse  15 mL Mouth Rinse BID   multivitamin with minerals  1 tablet Oral Daily   sodium chloride flush  3 mL Intravenous Q12H   Continuous Infusions:  sodium chloride     PRN Meds: sodium chloride, acetaminophen **OR** acetaminophen, albuterol, hydrALAZINE, naLOXone (NARCAN)  injection, nitroGLYCERIN, ondansetron (ZOFRAN) IV, sodium chloride flush   Vital Signs    Vitals:   07/15/21 2005 07/15/21 2204 07/16/21 0500 07/16/21 0529  BP: 120/72 121/75  132/85  Pulse: 61 65  69  Resp: '16 16  18  '$ Temp: 98.9 F (37.2 C)   97.9 F (36.6 C)  TempSrc: Oral   Oral  SpO2: 100% 99%  98%  Weight:   67 kg   Height:        Intake/Output Summary (Last 24 hours) at 07/16/2021 1004 Last data filed at 07/16/2021 0857 Gross per 24 hour  Intake 243 ml  Output 1120 ml  Net -877 ml      07/16/2021    5:00 AM 07/15/2021    5:00 AM 07/14/2021    4:47 AM  Last 3 Weights  Weight (lbs) 147 lb 11.3 oz 152 lb 8.9 oz 152 lb 5.4 oz  Weight (kg) 67 kg 69.2 kg 69.1 kg      Telemetry      ECG    07/15/2021- sinus, LVH with repol - Personally Reviewed  Physical Exam   Vitals:   07/15/21 2204 07/16/21 0529  BP: 121/75 132/85  Pulse: 65 69  Resp: 16 18  Temp:  97.9  F (36.6 C)  SpO2: 99% 98%    GEN: No acute distress.   Neck: No JVD Cardiac: RRR, no murmurs, rubs, or gallops.  Respiratory: Clear to auscultation bilaterally. GI: Soft, nontender, non-distended  MS: No edema; No deformity. Neuro:  Nonfocal  Psych: Normal affect   Labs    High Sensitivity Troponin:   Recent Labs  Lab 07/13/21 2315 07/14/21 0110 07/15/21 1553 07/15/21 1722 07/15/21 2348  TROPONINIHS 50* 46* 38* 38* 39*     Chemistry Recent Labs  Lab 07/09/21 1215 07/10/21 0841 07/14/21 0703 07/15/21 0507 07/16/21 0559  NA 140   < > 137 138 136  K 4.2   < > 4.4 4.0 3.8  CL 110   < > 107 108 106  CO2 24   < > 19* 23 23  GLUCOSE 86   < > 256* 104* 92  BUN 31*   < > 25* 38* 59*  CREATININE 2.64*   < > 2.03* 2.51* 3.04*  CALCIUM 8.6*   < >  9.0 8.9 8.7*  MG  --   --  2.3  --   --   PROT 6.6  --  7.9  --   --   ALBUMIN 2.9*  --  3.5  --   --   AST 15  --  23  --   --   ALT 16  --  14  --   --   ALKPHOS 50  --  64  --   --   BILITOT 0.5  --  0.7  --   --   GFRNONAA 26*   < > 36* 28* 22*  ANIONGAP 6   < > '11 7 7   '$ < > = values in this interval not displayed.    Lipids No results for input(s): CHOL, TRIG, HDL, LABVLDL, LDLCALC, CHOLHDL in the last 168 hours.  Hematology Recent Labs  Lab 07/13/21 2205 07/15/21 0507 07/16/21 0559  WBC 11.8* 13.2* 10.5  RBC 3.22* 3.04* 3.21*  HGB 10.5* 9.6* 10.2*  HCT 30.3* 28.7* 30.6*  MCV 94.1 94.4 95.3  MCH 32.6 31.6 31.8  MCHC 34.7 33.4 33.3  RDW 15.8* 15.9* 16.0*  PLT 393 391 439*   Thyroid  Recent Labs  Lab 07/14/21 0841  TSH 0.511    BNP Recent Labs  Lab 07/13/21 2315 07/14/21 0841  BNP 51.6 53.6    DDimer No results for input(s): DDIMER in the last 168 hours.   Radiology    NM Pulmonary Perfusion  Result Date: 07/14/2021 CLINICAL DATA:  Chest pain, shortness of breath EXAM: NUCLEAR MEDICINE PERFUSION LUNG SCAN TECHNIQUE: Perfusion images were obtained in multiple projections after intravenous  injection of radiopharmaceutical. Ventilation scans intentionally deferred if perfusion scan and chest x-ray adequate for interpretation during COVID 19 epidemic. RADIOPHARMACEUTICALS:  4.03 mCi Tc-56mMAA IV COMPARISON:  Chest radiograph done on 07/13/2021 FINDINGS: There are ill-defined small foci of decreased perfusion in the posterior left mid and left lower lung fields. There is moderate sized area of decreased perfusion in the posterior right lower lung fields. There are no definite wedge-shaped perfusion defects. Lung fields are essentially clear in the previous chest radiographs. IMPRESSION: There are small to moderate sized foci of decreased tracer uptake in the posterior left mid and left lower lung fields and posterior right lower lung fields. Study is indeterminate to evaluate for pulmonary embolism. If there is continued clinical suspicion for PE follow-up CT angiogram and/or venous Doppler examination of both lower extremities may be considered. Electronically Signed   By: PElmer PickerM.D.   On: 07/14/2021 12:44   VAS [place]OWER EXTREMITY VENOUS (DVT)  Result Date: 07/14/2021  Lower Venous DVT Study Patient Name:  CWARD BOISSONNEAULT Date of Exam:   07/14/2021 Medical Rec #: [id number]       Accession #:    [id number] of Birth: [birth year]       Patient Gender: M Patient Age:   89years [facility]      VAS [place]OWER EXTREMITY VENOUS (DVT) Referring Phys: ABess HarvestORTIZ --------------------------------------------------------------------------------  Indications: SOB, and Swelling.  Anticoagulation: Heparin. Comparison Study: 02-18-2016 Prior bilateral lower extremity venous was negative                   for DVT. Multiple prior arterial examinations. Performing Technologist: RDarlin CocoRDMS, RVT  Examination Guidelines: A complete evaluation includes B-mode imaging, spectral Doppler, color Doppler, and power Doppler as needed of all  accessible portions  of each vessel. Bilateral testing is considered an integral part of a complete examination. Limited examinations for reoccurring indications may be performed as noted. The reflux portion of the exam is performed with the patient in reverse Trendelenburg.  +---------+---------------+---------+-----------+----------+--------------+ RIGHT    CompressibilityPhasicitySpontaneityPropertiesThrombus Aging +---------+---------------+---------+-----------+----------+--------------+ CFV      Full           Yes      Yes                                 +---------+---------------+---------+-----------+----------+--------------+ SFJ      Full                                                        +---------+---------------+---------+-----------+----------+--------------+ FV Prox  Full                                                        +---------+---------------+---------+-----------+----------+--------------+ FV Mid   Full                                                        +---------+---------------+---------+-----------+----------+--------------+ FV DistalFull                                                        +---------+---------------+---------+-----------+----------+--------------+ PFV      Full                                                        +---------+---------------+---------+-----------+----------+--------------+ POP      Full           Yes      Yes                                 +---------+---------------+---------+-----------+----------+--------------+ PTV      Full                                                        +---------+---------------+---------+-----------+----------+--------------+ PERO     Full                                                        +---------+---------------+---------+-----------+----------+--------------+ Gastroc  Full                                                         +---------+---------------+---------+-----------+----------+--------------+   +---------+---------------+---------+-----------+----------+--------------+  LEFT     CompressibilityPhasicitySpontaneityPropertiesThrombus Aging +---------+---------------+---------+-----------+----------+--------------+ CFV      Full           Yes      Yes                                 +---------+---------------+---------+-----------+----------+--------------+ SFJ      Full                                                        +---------+---------------+---------+-----------+----------+--------------+ FV Prox  Full                                                        +---------+---------------+---------+-----------+----------+--------------+ FV Mid   Full                                                        +---------+---------------+---------+-----------+----------+--------------+ FV DistalFull                                                        +---------+---------------+---------+-----------+----------+--------------+ PFV      Full                                                        +---------+---------------+---------+-----------+----------+--------------+ POP      Full           Yes      Yes                                 +---------+---------------+---------+-----------+----------+--------------+ PTV      Full                                                        +---------+---------------+---------+-----------+----------+--------------+ PERO     Full                                                        +---------+---------------+---------+-----------+----------+--------------+ Gastroc  Full                                                        +---------+---------------+---------+-----------+----------+--------------+  Summary: RIGHT: - There is no evidence of deep vein thrombosis in the lower extremity.  - No cystic structure found in  the popliteal fossa.  LEFT: - There is no evidence of deep vein thrombosis in the lower extremity.  - No cystic structure found in the popliteal fossa.  *See table(s) above for measurements and observations. Electronically signed by Orlie Pollen on 07/14/2021 at 5:23:19 PM.    Final     Cardiac Studies    TTE 12/01/2020  1. Left ventricular ejection fraction, by estimation, is 55 to 60%. The  left ventricle has normal function. The left ventricle has no regional  wall motion abnormalities. There is mild concentric left ventricular  hypertrophy. Left ventricular diastolic  parameters are consistent with Grade I diastolic dysfunction (impaired  relaxation).   2. Right ventricular systolic function is normal. The right ventricular  size is normal.   3. The mitral valve is normal in structure. No evidence of mitral valve  regurgitation. No evidence of mitral stenosis.   4. The aortic valve is normal in structure. Aortic valve regurgitation is  not visualized. No aortic stenosis is present.   5. The inferior vena cava is normal in size with greater than 50%  respiratory variability, suggesting right atrial pressure of 3 mmHg.     Patient Profile      QUINN QUAM is a 66 y.o. male with a hx of hypertension, chronic kidney disease stage 3b, renal cell carcinoma s/p ablation, PAD, OSA on bipap, HIV with undetectable viral load who is being seen 07/15/2021 for the evaluation of chest pain at the request of Dr. Aileen Fass.  Assessment & Plan    Dyspnea: EKG not concerning for STEMI. His EKGs are stable. His troponin is only mild, this is unlikely NSTEMI. He does not have CHF. He has dyspnea with minimal movement and VQ scan was indeterminate. Would consider CT PE. Seems like pleuritic pain and is planned for CT anyway with pulmonary nodules, possibly has enlarged nodule leading to discomfort. No signs of pericarditis, will FU echo for effusion and if he does have this recommend CRP. - off  heparin gtt - FU echo - recommend CT PE  HTN: well controlled - continue norvasc 2.5 mg daily - continue ibersartan 300 mg daily -continue bidil  HLD: continue statin   For questions or updates, please contact [facility] Please consult [email or web address] for contact info under        Signed, Janina Mayo, MD  07/16/2021, 10:04 AM

## 2021-07-16 NOTE — Plan of Care (Signed)

## 2021-07-16 NOTE — Progress Notes (Signed)
[facility] PROGRESS NOTE    Progress Note  [name]  IEP:[id number] DOB: [birth year] DOA: 07/13/2021 PCP: Lucianne Lei, MD     Brief Narrative:   Kevin [name] is an 66 y.o. male  past medical history of COPD, paroxysmal atrial fibrillation not on anticoagulation, chronic kidney disease stage III with baseline creatinine around 2, essential hypertension, renal cell carcinoma status post ablation, with chronically elevated troponins who is being admitted for hypertensive crisis and atypical shortness of breath with nonexertional chest pain over the last few days.    Assessment/Plan:   Hypertensive crisis with acute on chronic diastolic heart failure: Started on amlodipine, Coreg, Avapro and Imdur along with IV Lasix and his blood pressure is improved. Has diuresed about 2 L. Continue strict I's and O's and daily weights. Appears euvolemic on physical exam creatinine is trending up hold Lasix. Blood pressure continues to improve. Developed shortness of breath cardiology was consulted was put temporarily on IV heparin, cardiac biomarkers remain flat shortness of breath improved, cardiology believes it is not an acute coronary syndrome. Appreciate assistance. Repeated 2D echo is pending.  Dyspnea probably due to COPD.: Start Brio and atrovent.  HIV disease Continue current home regimen.  Paroxysmal atrial fibrillation: Sinus rhythm not anticoagulation, continue Coreg.  Essential hypertension: See above for further details.  Chronic kidney disease stage III: Creatinine this morning is 2.5 continue to monitor. There is been a mild increase in his creatinine due to blood pressure control.  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    DVT prophylaxis: lovenox Family Communication:nmone Status is: Inpatient Remains inpatient appropriate because: Hypertensive crisis in the setting of acute diastolic heart failure.    Code  Status:     Code Status Orders  (From admission, onward)           Start     Ordered   07/14/21 0802  Full code  Continuous        07/14/21 0803           Code Status History     Date Active Date Inactive Code Status Order ID Comments User Context   07/14/2021 0256 07/14/2021 0804 Full Code [id number]  Rhetta Mura, DO ED   11/30/2020 0847 12/03/2020 2137 Full Code [id number]  Magdalen Spatz, NP ED   10/22/2016 2033 10/23/2016 2328 Full Code [id number]  Flossie Dibble, MD Inpatient   07/16/2016 1411 07/18/2016 1432 Full Code [id number]  Delos Haring, PA-C Inpatient   06/04/2016 1324 06/06/2016 1400 Full Code [id number]  Cheryln Manly, NP Inpatient   02/07/2016 1500 02/10/2016 1928 Full Code [id number]  Rondel Jumbo, PA-C ED         IV Access:   Peripheral IV   Procedures and diagnostic studies:   NM Pulmonary Perfusion  Result Date: 07/14/2021 CLINICAL DATA:  Chest pain, shortness of breath EXAM: NUCLEAR MEDICINE PERFUSION LUNG SCAN TECHNIQUE: Perfusion images were obtained in multiple projections after intravenous injection of radiopharmaceutical. Ventilation scans intentionally deferred if perfusion scan and chest x-ray adequate for interpretation during COVID 19 epidemic. RADIOPHARMACEUTICALS:  4.03 mCi Tc-37mMAA IV COMPARISON:  Chest radiograph done on 07/13/2021 FINDINGS: There are ill-defined small foci of decreased perfusion in the posterior left mid and left lower lung fields. There is moderate sized area of decreased perfusion in the posterior right lower lung fields. There are no definite wedge-shaped perfusion defects. Lung fields are essentially clear in the previous chest radiographs. IMPRESSION: There are  small to moderate sized foci of decreased tracer uptake in the posterior left mid and left lower lung fields and posterior right lower lung fields. Study is indeterminate to evaluate for pulmonary embolism. If there is continued clinical suspicion for PE  follow-up CT angiogram and/or venous Doppler examination of both lower extremities may be considered. Electronically Signed   By: Elmer Picker M.D.   On: 07/14/2021 12:44   VAS [name] LOWER EXTREMITY VENOUS (DVT)  Result Date: 07/14/2021  Lower Venous DVT Study Patient Name:  Kevin [name]  Date of Exam:   07/14/2021 Medical Rec #: [id number]        Accession #:    [id number] Date of Birth: [birth year]        Patient Gender: M Patient Age:   75 years Exam Location:  [facility] Procedure:      VAS [name] LOWER EXTREMITY VENOUS (DVT) Referring Phys: Bess Harvest ORTIZ --------------------------------------------------------------------------------  Indications: SOB, and Swelling.  Anticoagulation: Heparin. Comparison Study: 02-18-2016 Prior bilateral lower extremity venous was negative                   for DVT. Multiple prior arterial examinations. Performing Technologist: Darlin Coco RDMS, RVT  Examination Guidelines: A complete evaluation includes B-mode imaging, spectral Doppler, color Doppler, and power Doppler as needed of all accessible portions of each vessel. Bilateral testing is considered an integral part of a complete examination. Limited examinations for reoccurring indications may be performed as noted. The reflux portion of the exam is performed with the patient in reverse Trendelenburg.  +---------+---------------+---------+-----------+----------+--------------+ RIGHT    CompressibilityPhasicitySpontaneityPropertiesThrombus Aging +---------+---------------+---------+-----------+----------+--------------+ CFV      Full           Yes      Yes                                 +---------+---------------+---------+-----------+----------+--------------+ SFJ      Full                                                        +---------+---------------+---------+-----------+----------+--------------+ FV Prox  Full                                                         +---------+---------------+---------+-----------+----------+--------------+ FV Mid   Full                                                        +---------+---------------+---------+-----------+----------+--------------+ FV DistalFull                                                        +---------+---------------+---------+-----------+----------+--------------+ PFV      Full                                                        +---------+---------------+---------+-----------+----------+--------------+  POP      Full           Yes      Yes                                 +---------+---------------+---------+-----------+----------+--------------+ PTV      Full                                                        +---------+---------------+---------+-----------+----------+--------------+ PERO     Full                                                        +---------+---------------+---------+-----------+----------+--------------+ Gastroc  Full                                                        +---------+---------------+---------+-----------+----------+--------------+   +---------+---------------+---------+-----------+----------+--------------+ LEFT     CompressibilityPhasicitySpontaneityPropertiesThrombus Aging +---------+---------------+---------+-----------+----------+--------------+ CFV      Full           Yes      Yes                                 +---------+---------------+---------+-----------+----------+--------------+ SFJ      Full                                                        +---------+---------------+---------+-----------+----------+--------------+ FV Prox  Full                                                        +---------+---------------+---------+-----------+----------+--------------+ FV Mid   Full                                                         +---------+---------------+---------+-----------+----------+--------------+ FV DistalFull                                                        +---------+---------------+---------+-----------+----------+--------------+ PFV      Full                                                        +---------+---------------+---------+-----------+----------+--------------+  POP      Full           Yes      Yes                                 +---------+---------------+---------+-----------+----------+--------------+ PTV      Full                                                        +---------+---------------+---------+-----------+----------+--------------+ PERO     Full                                                        +---------+---------------+---------+-----------+----------+--------------+ Gastroc  Full                                                        +---------+---------------+---------+-----------+----------+--------------+     Summary: RIGHT: - There is no evidence of deep vein thrombosis in the lower extremity.  - No cystic structure found in the popliteal fossa.  LEFT: - There is no evidence of deep vein thrombosis in the lower extremity.  - No cystic structure found in the popliteal fossa.  *See table(s) above for measurements and observations. Electronically signed by Orlie Pollen on 07/14/2021 at 5:23:19 PM.    Final      Medical Consultants:   None.   Subjective:    [name] relates his breathing is about the same as yesterday.  Objective:    Vitals:   07/15/21 2005 07/15/21 2204 07/16/21 0500 07/16/21 0529  BP: 120/72 121/75  132/85  Pulse: 61 65  69  Resp: '16 16  18  '$ Temp: 98.9 F (37.2 C)   97.9 F (36.6 C)  TempSrc: Oral   Oral  SpO2: 100% 99%  98%  Weight:   67 kg   Height:       SpO2: 98 %   Intake/Output Summary (Last 24 hours) at 07/16/2021 0958 Last data filed at 07/16/2021 0857 Gross per 24 hour  Intake 243 ml   Output 1120 ml  Net -877 ml    Filed Weights   07/14/21 0447 07/15/21 0500 07/16/21 0500  Weight: 69.1 kg 69.2 kg 67 kg    Exam: General exam: In no acute distress. Respiratory system: Good air movement and clear to auscultation. Cardiovascular system: S1 & S2 heard, RRR. No JVD. Gastrointestinal system: Abdomen is nondistended, soft and nontender.  Extremities: No pedal edema. Skin: No rashes, lesions or ulcers Psychiatry: Judgement and insight appear normal. Mood & affect appropriate.   Data Reviewed:    Labs: Basic Metabolic Panel: Recent Labs  Lab 07/09/21 1215 07/10/21 0841 07/13/21 2205 07/14/21 0703 07/15/21 0507 07/16/21 0559  NA 140 141 136 137 138 136  K 4.2 3.7 3.8 4.4 4.0 3.8  CL 110 108 109 107 108 106  CO2 24  --  21* 19* 23 23  GLUCOSE 86 88 113* 256* 104*  92  BUN 31* 23 25* 25* 38* 59*  CREATININE 2.64* 1.90* 2.10* 2.03* 2.51* 3.04*  CALCIUM 8.6*  --  8.6* 9.0 8.9 8.7*  MG  --   --   --  2.3  --   --     GFR Estimated Creatinine Clearance: 23.6 mL/min (A) (by C-G formula based on SCr of 3.04 mg/dL (H)). Liver Function Tests: Recent Labs  Lab 07/09/21 1215 07/14/21 0703  AST 15 23  ALT 16 14  ALKPHOS 50 64  BILITOT 0.5 0.7  PROT 6.6 7.9  ALBUMIN 2.9* 3.5    Recent Labs  Lab 07/09/21 1215  LIPASE 60*    No results for input(s): AMMONIA in the last 168 hours. Coagulation profile Recent Labs  Lab 07/14/21 0245  INR 1.2    COVID-19 Labs  No results for input(s): DDIMER, FERRITIN, LDH, CRP in the last 72 hours.  Lab Results  Component Value Date   SARSCOV2NAA NEGATIVE 07/13/2021   SARSCOV2NAA RESULT: NEGATIVE 02/08/2021   SARSCOV2NAA NEGATIVE 11/30/2020   [place] NEGATIVE 06/07/2019    CBC: Recent Labs  Lab 07/09/21 1215 07/10/21 0841 07/13/21 2205 07/15/21 0507 07/16/21 0559  WBC 8.9  --  11.8* 13.2* 10.5  NEUTROABS 5.7  --   --   --   --   HGB 9.6* 9.2* 10.5* 9.6* 10.2*  HCT 28.9* 27.0* 30.3* 28.7* 30.6*   MCV 96.0  --  94.1 94.4 95.3  PLT 317  --  393 391 439*    Cardiac Enzymes: Recent Labs  Lab 07/09/21 1215  CKTOTAL 76    BNP (last 3 results) No results for input(s): PROBNP in the last 8760 hours. CBG: No results for input(s): GLUCAP in the last 168 hours. D-Dimer: No results for input(s): DDIMER in the last 72 hours. Hgb A1c: Recent Labs    07/14/21 0841  HGBA1C 5.4    Lipid Profile: No results for input(s): CHOL, HDL, LDLCALC, TRIG, CHOLHDL, LDLDIRECT in the last 72 hours. Thyroid function studies: Recent Labs    07/14/21 0841  TSH 0.511    Anemia work up: No results for input(s): VITAMINB12, FOLATE, FERRITIN, TIBC, IRON, RETICCTPCT in the last 72 hours. Sepsis Labs: Recent Labs  Lab 07/09/21 1215 07/13/21 2205 07/15/21 0507 07/16/21 0559  WBC 8.9 11.8* 13.2* 10.5    Microbiology Recent Results (from the past 240 hour(s))  Urine Culture     Status: Abnormal   Collection Time: 07/09/21  1:00 PM   Specimen: Urine, Clean Catch  Result Value Ref Range Status   Specimen Description URINE, CLEAN CATCH  Final   Special Requests NONE  Final   Culture (A)  Final    <10,000 COLONIES/mL INSIGNIFICANT GROWTH Performed at [facility], [place] [address], [place], [place] [address]    Report Status 07/10/2021 FINAL  Final  SARS Coronavirus 2 by RT PCR (hospital order, performed in [facility] hospital lab) *cepheid single result test* Anterior Nasal Swab     Status: None   Collection Time: 07/13/21 11:17 PM   Specimen: Anterior Nasal Swab  Result Value Ref Range Status   SARS Coronavirus 2 by RT PCR NEGATIVE NEGATIVE Final    Comment: (NOTE) SARS-CoV-2 target nucleic acids are NOT DETECTED.  The SARS-CoV-2 RNA is generally detectable in upper and lower respiratory specimens during the acute phase of infection. The lowest concentration of SARS-CoV-2 viral copies this assay can detect is 250 copies / mL. A negative result does not preclude SARS-CoV-2  infection and should not be used as the sole basis for treatment or other patient management decisions.  A negative result may occur with improper specimen collection / handling, submission of specimen other than nasopharyngeal swab, presence of viral mutation(s) within the areas targeted by this assay, and inadequate number of viral copies (<250 copies / mL). A negative result must be combined with clinical observations, patient history, and epidemiological information.  Fact Sheet for Patients:   https://www.patel.info/  Fact Sheet for Healthcare Providers: https://hall.com/  This test is not yet approved or  cleared by the [place] FDA and has been authorized for detection and/or diagnosis of SARS-CoV-2 by FDA under an Emergency Use Authorization (EUA).  This EUA will remain in effect (meaning this test can be used) for the duration of the COVID-19 declaration under Section 564(b)(1) of the Act, 21 U.S.C. section 360bbb-3(b)(1), unless the authorization is terminated or revoked sooner.  Performed at [facility], [place] [address], [place], [place] [address]   MRSA Next Gen by PCR, Nasal     Status: None   Collection Time: 07/14/21  4:36 AM   Specimen: Nasal Mucosa; Nasal Swab  Result Value Ref Range Status   MRSA by PCR Next Gen NOT DETECTED NOT DETECTED Final    Comment: (NOTE) The GeneXpert MRSA Assay (FDA approved for NASAL specimens only), is one component of a comprehensive MRSA colonization surveillance program. It is not intended to diagnose MRSA infection nor to guide or monitor treatment for MRSA infections. Test performance is not FDA approved in patients less than 63 years old. Performed at [facility], [place] [address], [place], [place] [address]      Medications:    amLODipine  2.5 mg Oral Daily   aspirin EC  81 mg Oral Daily   atorvastatin  40 mg Oral Daily   carvedilol   6.25 mg Oral BID WC   dolutegravir  50 mg Oral QHS   And   lamiVUDine  300 mg Oral QHS   escitalopram  20 mg Oral Daily   feeding supplement  1 Container Oral BID BM   ipratropium-albuterol  3 mL Nebulization BID   irbesartan  300 mg Oral Daily   isosorbide-hydrALAZINE  1 tablet Oral TID   mouth rinse  15 mL Mouth Rinse BID   multivitamin with minerals  1 tablet Oral Daily   sodium chloride flush  3 mL Intravenous Q12H   Continuous Infusions:  sodium chloride        LOS: 2 days   Charlynne Cousins  [facility]  07/16/2021, 9:58 AM

## 2021-07-17 DIAGNOSIS — C649 Malignant neoplasm of unspecified kidney, except renal pelvis: Secondary | ICD-10-CM

## 2021-07-17 DIAGNOSIS — I5033 Acute on chronic diastolic (congestive) heart failure: Secondary | ICD-10-CM | POA: Diagnosis not present

## 2021-07-17 DIAGNOSIS — R778 Other specified abnormalities of plasma proteins: Secondary | ICD-10-CM | POA: Diagnosis not present

## 2021-07-17 DIAGNOSIS — I169 Hypertensive crisis, unspecified: Secondary | ICD-10-CM | POA: Diagnosis not present

## 2021-07-17 DIAGNOSIS — I5031 Acute diastolic (congestive) heart failure: Secondary | ICD-10-CM | POA: Diagnosis not present

## 2021-07-17 LAB — CBC
HCT: 29.9 % — ABNORMAL LOW (ref 39.0–52.0)
Hemoglobin: 9.9 g/dL — ABNORMAL LOW (ref 13.0–17.0)
MCH: 31.7 pg (ref 26.0–34.0)
MCHC: 33.1 g/dL (ref 30.0–36.0)
MCV: 95.8 fL (ref 80.0–100.0)
Platelets: 422 10*3/uL — ABNORMAL HIGH (ref 150–400)
RBC: 3.12 MIL/uL — ABNORMAL LOW (ref 4.22–5.81)
RDW: 15.9 % — ABNORMAL HIGH (ref 11.5–15.5)
WBC: 16.3 10*3/uL — ABNORMAL HIGH (ref 4.0–10.5)
nRBC: 0 % (ref 0.0–0.2)

## 2021-07-17 LAB — BASIC METABOLIC PANEL
Anion gap: 7 (ref 5–15)
BUN: 60 mg/dL — ABNORMAL HIGH (ref 8–23)
CO2: 23 mmol/L (ref 22–32)
Calcium: 8.8 mg/dL — ABNORMAL LOW (ref 8.9–10.3)
Chloride: 108 mmol/L (ref 98–111)
Creatinine, Ser: 2.91 mg/dL — ABNORMAL HIGH (ref 0.61–1.24)
GFR, Estimated: 23 mL/min — ABNORMAL LOW (ref >60)
Glucose, Bld: 143 mg/dL — ABNORMAL HIGH (ref 70–99)
Potassium: 4.6 mmol/L (ref 3.5–5.1)
Sodium: 138 mmol/L (ref 135–145)

## 2021-07-17 LAB — HEPARIN LEVEL (UNFRACTIONATED): Heparin Unfractionated: <0.1 IU/mL — ABNORMAL LOW (ref 0.30–0.70)

## 2021-07-17 MED ORDER — ISOSORB DINITRATE-HYDRALAZINE 20-37.5 MG PO TABS: 1.0000 | ORAL_TABLET | Freq: Three times a day (TID) | ORAL | 3 refills | Status: DC

## 2021-07-17 MED ORDER — HYDROCHLOROTHIAZIDE 12.5 MG PO TABS
12.5000 mg | ORAL_TABLET | Freq: Every day | ORAL | Status: DC
  Administered 2021-07-17: 12.5 mg via ORAL
  Filled 2021-07-17: qty 1

## 2021-07-17 MED ORDER — ASPIRIN 81 MG PO TBEC: 81.0000 mg | DELAYED_RELEASE_TABLET | Freq: Every day | ORAL | 12 refills | Status: DC

## 2021-07-17 MED ORDER — FLUTICASONE FUROATE-VILANTEROL 200-25 MCG/ACT IN AEPB
1.0000 | INHALATION_SPRAY | Freq: Every day | RESPIRATORY_TRACT | 3 refills | Status: DC
Start: 2021-07-18 — End: 2021-12-27

## 2021-07-17 MED ORDER — VALSARTAN-HYDROCHLOROTHIAZIDE 80-12.5 MG PO TABS: 1.0000 | ORAL_TABLET | Freq: Every day | ORAL | 3 refills | Status: DC

## 2021-07-17 MED ORDER — CARVEDILOL 3.125 MG PO TABS: 3.1250 mg | ORAL_TABLET | Freq: Two times a day (BID) | ORAL | 11 refills | Status: DC

## 2021-07-17 MED ORDER — ATORVASTATIN CALCIUM 40 MG PO TABS: 40.0000 mg | ORAL_TABLET | Freq: Every day | ORAL | 3 refills | Status: DC

## 2021-07-17 NOTE — Progress Notes (Signed)
Went over discharge instructions w/ pt. Pt verbalized understanding.  

## 2021-07-17 NOTE — Discharge Summary (Signed)
Physician Discharge Summary  Kevin [name] OMV:[id number] DOB: 1956-01-05 DOA: 07/13/2021  PCP: Lucianne Lei, MD  Admit date: 07/13/2021 Discharge date: 07/17/2021  Admitted From: Home Disposition:  Home  Recommendations for Outpatient Follow-up:  Follow up with PCP in 1-2 weeks Please obtain BMP/CBC in one week   Home Health:No Equipment/Devices:None  Discharge Condition:Stable CODE STATUS:Full Diet recommendation: Heart Healthy   Brief/Interim Summary: 66 y.o. male  past medical history of COPD, paroxysmal atrial fibrillation not on anticoagulation, chronic kidney disease stage III with baseline creatinine around 2, essential hypertension, renal cell carcinoma status post ablation, with chronically elevated troponins who is being admitted for hypertensive crisis and atypical shortness of breath with nonexertional chest pain over the last few days.  Discharge Diagnoses:  Principal Problem:   Hypertensive crisis Active Problems:   HIV disease ([place])   Shortness of breath   PAF (paroxysmal atrial fibrillation) (HCC)   Essential hypertension   Acute on chronic diastolic CHF (congestive heart failure), NYHA class 3 (HCC)   Stage 3 chronic kidney disease (HCC)   Papillary renal cell carcinoma (HCC)   Acute diastolic heart failure ([place])  Hypertensive crisis with acute on chronic diastolic heart failure, Started apparently on Coreg Avapro Imdur IV Lasix and amlodipine. He diuresed about years. His blood pressure improved to 130/80 to 160/90. He will go home on Coreg, ARB hydrochlorothiazide and Imdur. 2D echo was done that showed no wall motion abnormalities. Because he was complaining of exertional shortness of breath cardiac biomarkers were cycled which remain flat cardiology was consulted recommended no further treatment.  Dyspnea: Likely due to hypertensive crisis.  HIV disease: Continue current regimen.  Paroxysmal atrial fibrillation: Remain in sinus rhythm continue  Coreg.  Essential hypertension: See above for further details.  Chronic kidney disease stage IIIa: With a baseline creatinine of 2.5 no change made to his medication.  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    Discharge Instructions  Discharge Instructions     Diet - low sodium heart healthy   Complete by: As directed    Increase activity slowly   Complete by: As directed       Allergies as of 07/17/2021   No Known Allergies      Medication List     TAKE these medications    albuterol 108 (90 Base) MCG/ACT inhaler Commonly known as: VENTOLIN HFA Inhale 2 puffs into the lungs every 6 (six) hours as needed for wheezing or shortness of breath. What changed:  when to take this Another medication with the same name was removed. Continue taking this medication, and follow the directions you see here.   aspirin EC 81 MG tablet Take 1 tablet (81 mg total) by mouth daily. Swallow whole.   atorvastatin 40 MG tablet Commonly known as: LIPITOR Take 1 tablet (40 mg total) by mouth daily.   carvedilol 3.125 MG tablet Commonly known as: Coreg Take 1 tablet (3.125 mg total) by mouth 2 (two) times daily.   Dovato 50-300 MG tablet Generic drug: dolutegravir-lamiVUDine Take 1 tablet by mouth daily.   escitalopram 10 MG tablet Commonly known as: LEXAPRO Take 20 mg by mouth daily.   fluticasone furoate-vilanterol 200-25 MCG/ACT Aepb Commonly known as: BREO ELLIPTA Inhale 1 puff into the lungs daily. Start taking on: July 18, 2021   isosorbide-hydrALAZINE 20-37.5 MG tablet Commonly known as: BIDIL Take 1 tablet by mouth 3 (three) times daily.   morphine 15 MG tablet Commonly known as: MSIR Take 1  tablet (15 mg total) by mouth every 6 (six) hours as needed for severe pain.   multivitamin tablet Take 1 tablet by mouth daily.   valsartan-hydrochlorothiazide 80-12.5 MG tablet Commonly known as: DIOVAN-HCT Take 1 tablet by mouth  daily.        No Known Allergies  Consultations: Cardiology   Procedures/Studies: DG Chest 1 View  Result Date: 06/29/2021 CLINICAL DATA:  Preop chest x-ray EXAM: CHEST  1 VIEW COMPARISON:  04/19/2021 FINDINGS: The heart size and mediastinal contours are within normal limits. Both lungs are clear. The visualized skeletal structures are unremarkable. IMPRESSION: No active disease. Electronically Signed   By: Franchot Gallo M.D.   On: 06/29/2021 09:57   DG Chest 2 View  Result Date: 07/13/2021 CLINICAL DATA:  Shortness of breath EXAM: CHEST - 2 VIEW COMPARISON:  06/29/2021 FINDINGS: The heart size and mediastinal contours are within normal limits. Both lungs are clear. The visualized skeletal structures are unremarkable. IMPRESSION: Negative. Electronically Signed   By: Rolm Baptise M.D.   On: 07/13/2021 22:06   NM Pulmonary Perfusion  Result Date: 07/14/2021 CLINICAL DATA:  Chest pain, shortness of breath EXAM: NUCLEAR MEDICINE PERFUSION LUNG SCAN TECHNIQUE: Perfusion images were obtained in multiple projections after intravenous injection of radiopharmaceutical. Ventilation scans intentionally deferred if perfusion scan and chest x-ray adequate for interpretation during COVID 19 epidemic. RADIOPHARMACEUTICALS:  4.03 mCi Tc-41mMAA IV COMPARISON:  Chest radiograph done on 07/13/2021 FINDINGS: There are ill-defined small foci of decreased perfusion in the posterior left mid and left lower lung fields. There is moderate sized area of decreased perfusion in the posterior right lower lung fields. There are no definite wedge-shaped perfusion defects. Lung fields are essentially clear in the previous chest radiographs. IMPRESSION: There are small to moderate sized foci of decreased tracer uptake in the posterior left mid and left lower lung fields and posterior right lower lung fields. Study is indeterminate to evaluate for pulmonary embolism. If there is continued clinical suspicion for PE follow-up  CT angiogram and/or venous Doppler examination of both lower extremities may be considered. Electronically Signed   By: PElmer PickerM.D.   On: 07/14/2021 12:44   [place]domen Complete  Result Date: 07/10/2021 CLINICAL DATA:  Right upper quadrant abdominal pain and right flank pain for 5 days. EXAM: ABDOMEN ULTRASOUND COMPLETE COMPARISON:  Prior CTs, most recent dated 07/09/2021. FINDINGS: Gallbladder: No gallstones or wall thickening visualized. No sonographic Murphy sign noted by sonographer. Common bile duct: Diameter: 3 mm Liver: No focal lesion identified. Within normal limits in parenchymal echogenicity. Portal vein is patent on color Doppler imaging with normal direction of blood flow towards the liver. IVC: No abnormality visualized. Pancreas: Visualized portion unremarkable. Spleen: Partly obscured but overall normal in size and appearance. Right Kidney: Length: 10.1 cm. Normal overall parenchymal echogenicity. Vague hypoechoic area in the renal sinus region of the midpole, 2.1 x 1.6 x 2.0 cm. Tiny cortical cystic lesion, 5 mm, midpole. No other masses, no stones and no hydronephrosis. Left Kidney: Length: 9.8 cm. Normal parenchymal echogenicity. Simple cyst arises from the lower pole, 9 mm. No other masses, no stones and no hydronephrosis. Abdominal aorta: No aneurysm visualized. Other findings: None. IMPRESSION: 1. No acute findings.  Normal gallbladder.  No bile duct dilation. 2. Hypoechoic area within the midpole the right kidney, corresponding to the hypoechoic area noted on the previous day's CT. This also corresponds to a lesion noted on MRI dated 05/14/2021 that subsequently underwent CT-guided ablation. 3. 5 mm  cystic lesion in the right kidney, too small to fully characterize but likely a simple cyst, and corresponding to a simple cyst present on the prior MRI. 4. 9 mm simple cyst arising from the left kidney. Electronically Signed   By: Lajean Manes M.D.   On: 07/10/2021 11:02   CT  GUIDE TISSUE ABLATION  Addendum Date: 06/30/2021   ADDENDUM REPORT: 06/30/2021 10:36 ADDENDUM: RADIATION DOSE REDUCTION: This exam was performed according to the departmental dose-optimization program which includes automated exposure control, adjustment of the mA and/or kV according to patient size and/or use of iterative reconstruction technique. Electronically Signed   By: Markus Daft M.D.   On: 06/30/2021 10:36   Result Date: 06/30/2021 INDICATION: 67 year old with a suspicious right renal lesion. Scheduled for image guided cryoablation and biopsy. EXAM: CT AND ULTRASOUND GUIDED CRYOABLATION OF RIGHT RENAL LESION IMAGE GUIDED BIOPSY OF RIGHT RENAL LESION COMPARISON:  None Available. MEDICATIONS: Rocephin 1 g; The antibiotic was administered in an appropriate time interval prior to needle puncture of the skin. ANESTHESIA/SEDATION: General - as administered by the Anesthesia department FLUOROSCOPY: None COMPLICATIONS: None immediate. TECHNIQUE: Informed written consent was obtained from the patient after a thorough discussion of the procedural risks, benefits and alternatives. All questions were addressed. A timeout was performed prior to the initiation of the procedure. Patient was intubated by anesthesia. Patient was placed prone. Right renal lesion was identified with ultrasound. CT images of the abdomen were also obtained. The right renal lesion was identified with CT. Right flank was prepped with chlorhexidine and sterile field was created. Maximal barrier sterile technique was utilized including caps, mask, sterile gowns, sterile gloves, sterile drape, hand hygiene and skin antiseptic. Using ultrasound guidance, 2 Cryocare PCS-17 needles were directed into the right kidney. Needles were repositioned multiple times and needles were directed along the long axis of the lesion using ultrasound guidance. One lesion was placed deeper into the kidney and the other was placed more superficial near the periphery.  Needle positioning was confirmed with CT. Subsequently, hydro dissection was performed using at dilute contrast through a [place] needle. Approximately 400 mL of hydro dissection was performed around the right kidney. Follow up CT images were obtained to evaluate the hydro dissection. Using ultrasound guidance, a 17 gauge coaxial needle was directed into the lesion and 2 core biopsies were obtained with an 18 gauge device. Specimens placed in formalin. Cryoablation was performed with 9 minute freeze, 6 minute thaw and a second 9 minute freeze. Both needles removed after final 6 minute thaw. Final CT images were obtained. Bandages were placed. Patient was taken to the PACU for recovery. FINDINGS: Ultrasound and CT demonstrated a slightly exophytic lesion along the posterior right kidney. Lesion measured approximately 1.4 cm on CT. This lesion was hypoechoic on ultrasound. Due to the overlying rib, the lesion was best accessed using ultrasound guidance with the lesion elongated and needle was directed from a slightly caudal to cranial approach. Needles were placed in near parallel configuration. Hydro dissection was performed around the right kidney to protect the intercostal space. Ice balls were confirmed on CT images during the freeze. Ice ball appeared to be encompassing the suspicious lesion. There is probably a small amount of ice ball extending into the posterior intercostal space as well. No immediate bleeding or hematoma formations. IMPRESSION: Image guided biopsy and cryoablation of right renal lesion. Electronically Signed: By: Markus Daft M.D. On: 06/30/2021 08:02   CT BIOPSY  Addendum Date: 06/30/2021  ADDENDUM REPORT: 06/30/2021 10:36 ADDENDUM: RADIATION DOSE REDUCTION: This exam was performed according to the departmental dose-optimization program which includes automated exposure control, adjustment of the mA and/or kV according to patient size and/or use of iterative reconstruction  technique. Electronically Signed   By: Markus Daft M.D.   On: 06/30/2021 10:36   Result Date: 06/30/2021 INDICATION: 66 year old with a suspicious right renal lesion. Scheduled for image guided cryoablation and biopsy. EXAM: CT AND ULTRASOUND GUIDED CRYOABLATION OF RIGHT RENAL LESION IMAGE GUIDED BIOPSY OF RIGHT RENAL LESION COMPARISON:  None Available. MEDICATIONS: Rocephin 1 g; The antibiotic was administered in an appropriate time interval prior to needle puncture of the skin. ANESTHESIA/SEDATION: General - as administered by the Anesthesia department FLUOROSCOPY: None COMPLICATIONS: None immediate. TECHNIQUE: Informed written consent was obtained from the patient after a thorough discussion of the procedural risks, benefits and alternatives. All questions were addressed. A timeout was performed prior to the initiation of the procedure. Patient was intubated by anesthesia. Patient was placed prone. Right renal lesion was identified with ultrasound. CT images of the abdomen were also obtained. The right renal lesion was identified with CT. Right flank was prepped with chlorhexidine and sterile field was created. Maximal barrier sterile technique was utilized including caps, mask, sterile gowns, sterile gloves, sterile drape, hand hygiene and skin antiseptic. Using ultrasound guidance, 2 Cryocare PCS-17 needles were directed into the right kidney. Needles were repositioned multiple times and needles were directed along the long axis of the lesion using ultrasound guidance. One lesion was placed deeper into the kidney and the other was placed more superficial near the periphery. Needle positioning was confirmed with CT. Subsequently, hydro dissection was performed using at dilute contrast through a McGehee needle. Approximately 400 mL of hydro dissection was performed around the right kidney. Follow up CT images were obtained to evaluate the hydro dissection. Using ultrasound guidance, a 17 gauge coaxial  needle was directed into the lesion and 2 core biopsies were obtained with an 18 gauge device. Specimens placed in formalin. Cryoablation was performed with 9 minute freeze, 6 minute thaw and a second 9 minute freeze. Both needles removed after final 6 minute thaw. Final CT images were obtained. Bandages were placed. Patient was taken to the PACU for recovery. FINDINGS: Ultrasound and CT demonstrated a slightly exophytic lesion along the posterior right kidney. Lesion measured approximately 1.4 cm on CT. This lesion was hypoechoic on ultrasound. Due to the overlying rib, the lesion was best accessed using ultrasound guidance with the lesion elongated and needle was directed from a slightly caudal to cranial approach. Needles were placed in near parallel configuration. Hydro dissection was performed around the right kidney to protect the intercostal space. Ice balls were confirmed on CT images during the freeze. Ice ball appeared to be encompassing the suspicious lesion. There is probably a small amount of ice ball extending into the posterior intercostal space as well. No immediate bleeding or hematoma formations. IMPRESSION: Image guided biopsy and cryoablation of right renal lesion. Electronically Signed: By: Markus Daft M.D. On: 06/30/2021 08:02   ECHOCARDIOGRAM COMPLETE  Result Date: 07/16/2021    ECHOCARDIOGRAM REPORT   Patient Name:   Kevin [name] Date of Exam: 07/16/2021 Medical Rec #:  [id number]       Height:       72.0 in Accession #:    [id number]      Weight:       147.7 lb Date of Birth:  [birth year]  BSA:          1.873 m Patient Age:    31 years        BP:           132/85 mmHg Patient Gender: M               HR:           51 bpm. Exam Location:  Inpatient Procedure: 2D Echo, Cardiac Doppler and Color Doppler Indications:    Dyspnea  History:        Patient has prior history of Echocardiogram examinations. CHF,                 CAD, COPD, Arrythmias:Atrial Fibrillation; Risk Factors:Sleep                  Apnea and Hypertension.  Sonographer:    Jyl Heinz Referring Phys: [place]  1. Left ventricular ejection fraction, by estimation, is 60 to 65%. The left ventricle has normal function. The left ventricle has no regional wall motion abnormalities. moderate to severe concentric left ventricular hypertrophy. Left ventricular diastolic parameters are consistent with Grade I diastolic dysfunction (impaired relaxation).  2. Right ventricular systolic function is normal. The right ventricular size is normal. Tricuspid regurgitation signal is inadequate for assessing PA pressure.  3. No evidence of mitral valve regurgitation.  4. Aortic valve regurgitation is not visualized.  5. The inferior vena cava is normal in size with greater than 50% respiratory variability, suggesting right atrial pressure of 3 mmHg. Comparison(s): No significant change from prior study. FINDINGS  Left Ventricle: Left ventricular ejection fraction, by estimation, is 60 to 65%. The left ventricle has normal function. The left ventricle has no regional wall motion abnormalities. The left ventricular internal cavity size was normal in size. Moderate  to severe concentric left ventricular hypertrophy. Left ventricular diastolic parameters are consistent with Grade I diastolic dysfunction (impaired relaxation). Right Ventricle: The right ventricular size is normal. Right ventricular systolic function is normal. Tricuspid regurgitation signal is inadequate for assessing PA pressure. Left Atrium: Left atrial size was normal in size. Right Atrium: Right atrial size was normal in size. Pericardium: There is no evidence of pericardial effusion. Mitral Valve: No evidence of mitral valve regurgitation. Tricuspid Valve: Tricuspid valve regurgitation is not demonstrated. Aortic Valve: Aortic valve regurgitation is not visualized. Aortic valve peak gradient measures 4.8 mmHg. Pulmonic Valve: Pulmonic valve regurgitation is  not visualized. Venous: The inferior vena cava is normal in size with greater than 50% respiratory variability, suggesting right atrial pressure of 3 mmHg. IAS/Shunts: No atrial level shunt detected by color flow Doppler.  LEFT VENTRICLE PLAX 2D LVIDd:         4.80 cm      Diastology LVIDs:         3.00 cm      LV e' medial:    5.44 cm/s LV PW:         1.40 cm      LV E/e' medial:  10.6 LV IVS:        1.30 cm      LV e' lateral:   5.22 cm/s LVOT diam:     2.20 cm      LV E/e' lateral: 11.1 LV SV:         71 LV SV Index:   38 LVOT Area:     3.80 cm  LV Volumes (MOD) LV vol d, MOD A2C: 132.0 ml  LV vol d, MOD A4C: 114.0 ml LV vol s, MOD A2C: 50.4 ml LV vol s, MOD A4C: 39.9 ml LV SV MOD A2C:     81.6 ml LV SV MOD A4C:     114.0 ml LV SV MOD BP:      80.5 ml RIGHT VENTRICLE            IVC RV Basal diam:  3.10 cm    IVC diam: 1.20 cm RV S prime:     9.90 cm/s TAPSE (M-mode): 2.7 cm LEFT ATRIUM             Index        RIGHT ATRIUM           Index LA diam:        3.50 cm 1.87 cm/m   RA Area:     14.90 cm LA Vol (A2C):   78.7 ml 42.02 ml/m  RA Volume:   32.30 ml  17.24 ml/m LA Vol (A4C):   49.7 ml 26.53 ml/m LA Biplane Vol: 64.7 ml 34.54 ml/m  AORTIC VALVE AV Area (Vmax): 3.39 cm AV Vmax:        109.00 cm/s AV Peak Grad:   4.8 mmHg LVOT Vmax:      97.30 cm/s LVOT Vmean:     72.300 cm/s LVOT VTI:       0.187 m  AORTA Ao Root diam: 3.60 cm Ao Asc diam:  3.20 cm MITRAL VALVE MV Area (PHT): 3.45 cm    SHUNTS MV Decel Time: 220 msec    Systemic VTI:  0.19 m MV E velocity: 57.80 cm/s  Systemic Diam: 2.20 cm MV A velocity: 59.10 cm/s MV E/A ratio:  0.98 Landscape architect signed by Phineas Inches Signature Date/Time: 07/16/2021/12:37:28 PM    Final    CT Renal Stone Study  Result Date: 07/14/2021 CLINICAL DATA:  Right flank pain EXAM: CT ABDOMEN AND PELVIS WITHOUT CONTRAST TECHNIQUE: Multidetector CT imaging of the abdomen and pelvis was performed following the standard protocol without IV contrast. RADIATION DOSE  REDUCTION: This exam was performed according to the departmental dose-optimization program which includes automated exposure control, adjustment of the mA and/or kV according to patient size and/or use of iterative reconstruction technique. COMPARISON:  07/09/2021, MRI 05/14/2021 FINDINGS: Lower chest: No acute abnormality Hepatobiliary: No focal hepatic abnormality. Gallbladder unremarkable. Pancreas: No focal abnormality or ductal dilatation. Spleen: No focal abnormality.  Normal size. Adrenals/Urinary Tract: Low-density lesion again noted posteriorly in the midpole of the right kidney, unchanged since recent study. This has enlarged since prior MRI from 05/14/2021. This recently has undergone biopsy and cryoablation, likely the cause of the increased size. No visible surrounding hematoma. No renal or ureteral stones. No hydronephrosis. Adrenal glands and urinary bladder unremarkable. Stomach/Bowel: Normal appendix. Stomach, large and small bowel grossly unremarkable. Vascular/Lymphatic: No evidence of aneurysm or adenopathy. Scattered aortic calcifications. Reproductive: No visible focal abnormality. Other: No free fluid or free air. Musculoskeletal: No acute bony abnormality. IMPRESSION: 3.8 cm low-density lesion posteriorly in the midpole of the right kidney which cannot be characterized on this noncontrast study. This appears significantly larger when compared to prior MRI. This has recently undergone biopsy and cryoablation, likely the cause of the apparent increased size of the lesion. No visible complicating feature. No renal or ureteral stones.  No hydronephrosis. Aortic atherosclerosis. Electronically Signed   By: Rolm Baptise M.D.   On: 07/14/2021 00:44   CT RENAL STONE STUDY  Result Date: 07/09/2021 CLINICAL  DATA:  Flank pain, kidney stone suspected. EXAM: CT ABDOMEN AND PELVIS WITHOUT CONTRAST TECHNIQUE: Multidetector CT imaging of the abdomen and pelvis was performed following the standard  protocol without IV contrast. RADIATION DOSE REDUCTION: This exam was performed according to the departmental dose-optimization program which includes automated exposure control, adjustment of the mA and/or kV according to patient size and/or use of iterative reconstruction technique. COMPARISON:  CT examination dated April 15, 2021 FINDINGS: Lower chest: No acute abnormality. Hepatobiliary: No focal liver abnormality is seen. No gallstones, gallbladder wall thickening, or biliary dilatation. Pancreas: Unremarkable. No pancreatic ductal dilatation or surrounding inflammatory changes. Spleen: Normal in size without focal abnormality. Adrenals/Urinary Tract: Adrenal glands are unremarkable. Punctate nonobstructing calculus in the left renal pelvis. No evidence of hydronephrosis or ureteral calculus. Hypodense complex cystic structure in the interpolar region of the right kidney, not well visualized on this noncontrast examination, likely proteinaceous cyst. Bladder is unremarkable. Stomach/Bowel: Stomach is within normal limits. Appendix appears normal. No evidence of bowel wall thickening, distention, or inflammatory changes. Vascular/Lymphatic: Aortic atherosclerosis. No enlarged abdominal or pelvic lymph nodes. Reproductive: Prostate is unremarkable. Other: No abdominal wall hernia or abnormality. No abdominopelvic ascites. Musculoskeletal: Mild degenerative disease of the lumbar spine. No acute osseous abnormality. IMPRESSION: 1. Punctate calculus in the interpolar region of the left kidney without evidence of hydronephrosis or ureteral calculus. 2. No evidence of right nephrolithiasis or hydronephrosis. Complex cyst in the posterior aspect of the inter polar region, unchanged, evaluation is limited on this noncontrast enhanced examination. 3. Bowel loops are normal in caliber. Normal appendix. No evidence of colitis or diverticulitis. 4.  No CT evidence of acute abdominal/pelvic process. Electronically Signed    By: Keane Police D.O.   On: 07/09/2021 12:46   VAS [name] LOWER EXTREMITY VENOUS (DVT)  Result Date: 07/14/2021  Lower Venous DVT Study Patient Name:  Kevin [name]  Date of Exam:   07/14/2021 Medical Rec #: [id number]        Accession #:    [id number] Date of Birth: May 28, 1955        Patient Gender: M Patient Age:   11 years Exam Location:  [facility] Procedure:      VAS [name] LOWER EXTREMITY VENOUS (DVT) Referring Phys: Bess Harvest ORTIZ --------------------------------------------------------------------------------  Indications: SOB, and Swelling.  Anticoagulation: Heparin. Comparison Study: 02-18-2016 Prior bilateral lower extremity venous was negative                   for DVT. Multiple prior arterial examinations. Performing Technologist: Darlin Coco RDMS, RVT  Examination Guidelines: A complete evaluation includes B-mode imaging, spectral Doppler, color Doppler, and power Doppler as needed of all accessible portions of each vessel. Bilateral testing is considered an integral part of a complete examination. Limited examinations for reoccurring indications may be performed as noted. The reflux portion of the exam is performed with the patient in reverse Trendelenburg.  +---------+---------------+---------+-----------+----------+--------------+ RIGHT    CompressibilityPhasicitySpontaneityPropertiesThrombus Aging +---------+---------------+---------+-----------+----------+--------------+ CFV      Full           Yes      Yes                                 +---------+---------------+---------+-----------+----------+--------------+ SFJ      Full                                                        +---------+---------------+---------+-----------+----------+--------------+  FV Prox  Full                                                        +---------+---------------+---------+-----------+----------+--------------+ FV Mid   Full                                                         +---------+---------------+---------+-----------+----------+--------------+ FV DistalFull                                                        +---------+---------------+---------+-----------+----------+--------------+ PFV      Full                                                        +---------+---------------+---------+-----------+----------+--------------+ POP      Full           Yes      Yes                                 +---------+---------------+---------+-----------+----------+--------------+ PTV      Full                                                        +---------+---------------+---------+-----------+----------+--------------+ PERO     Full                                                        +---------+---------------+---------+-----------+----------+--------------+ Gastroc  Full                                                        +---------+---------------+---------+-----------+----------+--------------+   +---------+---------------+---------+-----------+----------+--------------+ LEFT     CompressibilityPhasicitySpontaneityPropertiesThrombus Aging +---------+---------------+---------+-----------+----------+--------------+ CFV      Full           Yes      Yes                                 +---------+---------------+---------+-----------+----------+--------------+ SFJ      Full                                                        +---------+---------------+---------+-----------+----------+--------------+  FV Prox  Full                                                        +---------+---------------+---------+-----------+----------+--------------+ FV Mid   Full                                                        +---------+---------------+---------+-----------+----------+--------------+ FV DistalFull                                                         +---------+---------------+---------+-----------+----------+--------------+ PFV      Full                                                        +---------+---------------+---------+-----------+----------+--------------+ POP      Full           Yes      Yes                                 +---------+---------------+---------+-----------+----------+--------------+ PTV      Full                                                        +---------+---------------+---------+-----------+----------+--------------+ PERO     Full                                                        +---------+---------------+---------+-----------+----------+--------------+ Gastroc  Full                                                        +---------+---------------+---------+-----------+----------+--------------+     Summary: RIGHT: - There is no evidence of deep vein thrombosis in the lower extremity.  - No cystic structure found in the popliteal fossa.  LEFT: - There is no evidence of deep vein thrombosis in the lower extremity.  - No cystic structure found in the popliteal fossa.  *See table(s) above for measurements and observations. Electronically signed by Orlie Pollen on 07/14/2021 at 5:23:19 PM.    Final    (Echo, Carotid, EGD, Colonoscopy, ERCP)    Subjective: No complaints  Discharge Exam: Vitals:   07/17/21 0526 07/17/21 0811  BP: (!) 160/90   Pulse: 75   Resp: 12   Temp: 97.8  F (36.6 C)   SpO2: 98% 98%   Vitals:   07/16/21 2127 07/17/21 0500 07/17/21 0526 07/17/21 0811  BP: (!) 165/123  (!) 160/90   Pulse: 74  75   Resp: 16  12   Temp: 98.6 F (37 C)  97.8 F (36.6 C)   TempSrc:   Oral   SpO2: 98%  98% 98%  Weight:  73 kg    Height:        General: Pt is alert, awake, not in acute distress Cardiovascular: RRR, S1/S2 +, no rubs, no gallops Respiratory: CTA bilaterally, no wheezing, no rhonchi Abdominal: Soft, NT, ND, bowel sounds + Extremities: no edema, no  cyanosis    The results of significant diagnostics from this hospitalization (including imaging, microbiology, ancillary and laboratory) are listed below for reference.     Microbiology: Recent Results (from the past 240 hour(s))  Urine Culture     Status: Abnormal   Collection Time: 07/09/21  1:00 PM   Specimen: Urine, Clean Catch  Result Value Ref Range Status   Specimen Description URINE, CLEAN CATCH  Final   Special Requests NONE  Final   Culture (A)  Final    <10,000 COLONIES/mL INSIGNIFICANT GROWTH Performed at [facility], [address] [place], [place] [address]    Report Status 07/10/2021 FINAL  Final  SARS Coronavirus 2 by RT PCR (hospital order, performed in [facility] hospital lab) *cepheid single result test* Anterior Nasal Swab     Status: None   Collection Time: 07/13/21 11:17 PM   Specimen: Anterior Nasal Swab  Result Value Ref Range Status   SARS Coronavirus 2 by RT PCR NEGATIVE NEGATIVE Final    Comment: (NOTE) SARS-CoV-2 target nucleic acids are NOT DETECTED.  The SARS-CoV-2 RNA is generally detectable in upper and lower respiratory specimens during the acute phase of infection. The lowest concentration of SARS-CoV-2 viral copies this assay can detect is 250 copies / mL. A negative result does not preclude SARS-CoV-2 infection and should not be used as the sole basis for treatment or other patient management decisions.  A negative result may occur with improper specimen collection / handling, submission of specimen other than nasopharyngeal swab, presence of viral mutation(s) within the areas targeted by this assay, and inadequate number of viral copies (<250 copies / mL). A negative result must be combined with clinical observations, patient history, and epidemiological information.  Fact Sheet for Patients:   https://www.patel.info/  Fact Sheet for Healthcare Providers: https://hall.com/  This test  is not yet approved or  cleared by the [place] FDA and has been authorized for detection and/or diagnosis of SARS-CoV-2 by FDA under an Emergency Use Authorization (EUA).  This EUA will remain in effect (meaning this test can be used) for the duration of the COVID-19 declaration under Section 564(b)(1) of the Act, 21 U.S.C. section 360bbb-3(b)(1), unless the authorization is terminated or revoked sooner.  Performed at [facility], [place] [address], [place], [place] [address]   MRSA Next Gen by PCR, Nasal     Status: None   Collection Time: 07/14/21  4:36 AM   Specimen: Nasal Mucosa; Nasal Swab  Result Value Ref Range Status   MRSA by PCR Next Gen NOT DETECTED NOT DETECTED Final    Comment: (NOTE) The GeneXpert MRSA Assay (FDA approved for NASAL specimens only), is one component of a comprehensive MRSA colonization surveillance program. It is not intended to diagnose MRSA infection nor to guide or  monitor treatment for MRSA infections. Test performance is not FDA approved in patients less than 39 years old. Performed at [facility], [place] [address] [place], [place] [address]      Labs: BNP (last 3 results) Recent Labs    01/01/21 0808 07/13/21 2315 07/14/21 0841  BNP 22.7 51.6 68.3   Basic Metabolic Panel: Recent Labs  Lab 07/13/21 2205 07/14/21 0703 07/15/21 0507 07/16/21 0559 07/17/21 0535  NA 136 137 138 136 138  K 3.8 4.4 4.0 3.8 4.6  CL 109 107 108 106 108  CO2 21* 19* '23 23 23  '$ GLUCOSE 113* 256* 104* 92 143*  BUN 25* 25* 38* 59* 60*  CREATININE 2.10* 2.03* 2.51* 3.04* 2.91*  CALCIUM 8.6* 9.0 8.9 8.7* 8.8*  MG  --  2.3  --   --   --    Liver Function Tests: Recent Labs  Lab 07/14/21 0703  AST 23  ALT 14  ALKPHOS 64  BILITOT 0.7  PROT 7.9  ALBUMIN 3.5   No results for input(s): LIPASE, AMYLASE in the last 168 hours. No results for input(s): AMMONIA in the last 168 hours. CBC: Recent Labs  Lab  07/13/21 2205 07/15/21 0507 07/16/21 0559 07/17/21 0535  WBC 11.8* 13.2* 10.5 16.3*  HGB 10.5* 9.6* 10.2* 9.9*  HCT 30.3* 28.7* 30.6* 29.9*  MCV 94.1 94.4 95.3 95.8  PLT 393 391 439* 422*   Cardiac Enzymes: No results for input(s): CKTOTAL, CKMB, CKMBINDEX, TROPONINI in the last 168 hours. BNP: Invalid input(s): POCBNP CBG: No results for input(s): GLUCAP in the last 168 hours. D-Dimer No results for input(s): DDIMER in the last 72 hours. Hgb A1c No results for input(s): HGBA1C in the last 72 hours. Lipid Profile No results for input(s): CHOL, HDL, LDLCALC, TRIG, CHOLHDL, LDLDIRECT in the last 72 hours. Thyroid function studies No results for input(s): TSH, T4TOTAL, T3FREE, THYROIDAB in the last 72 hours.  Invalid input(s): FREET3 Anemia work up No results for input(s): VITAMINB12, FOLATE, FERRITIN, TIBC, IRON, RETICCTPCT in the last 72 hours. Urinalysis    Component Value Date/Time   COLORURINE YELLOW 07/09/2021 1215   APPEARANCEUR CLEAR 07/09/2021 1215   LABSPEC 1.012 07/09/2021 1215   PHURINE 5.0 07/09/2021 1215   GLUCOSEU NEGATIVE 07/09/2021 1215   HGBUR LARGE (A) 07/09/2021 1215   BILIRUBINUR NEGATIVE 07/09/2021 1215   KETONESUR NEGATIVE 07/09/2021 1215   PROTEINUR NEGATIVE 07/09/2021 1215   UROBILINOGEN 0.2 01/17/2013 0022   NITRITE NEGATIVE 07/09/2021 1215   LEUKOCYTESUR NEGATIVE 07/09/2021 1215   Sepsis Labs Invalid input(s): PROCALCITONIN,  WBC,  LACTICIDVEN Microbiology Recent Results (from the past 240 hour(s))  Urine Culture     Status: Abnormal   Collection Time: 07/09/21  1:00 PM   Specimen: Urine, Clean Catch  Result Value Ref Range Status   Specimen Description URINE, CLEAN CATCH  Final   Special Requests NONE  Final   Culture (A)  Final    <10,000 COLONIES/mL INSIGNIFICANT GROWTH Performed at [facility], [address], [name], [place] [address]    Report Status 07/10/2021 FINAL  Final  SARS Coronavirus 2 by RT PCR (hospital order,  performed in [facility] hospital lab) *cepheid single result test* Anterior Nasal Swab     Status: None   Collection Time: 07/13/21 11:17 PM   Specimen: Anterior Nasal Swab  Result Value Ref Range Status   SARS Coronavirus 2 by RT PCR NEGATIVE NEGATIVE Final    Comment: (NOTE) SARS-CoV-2 target nucleic acids  are NOT DETECTED.  The SARS-CoV-2 RNA is generally detectable in upper and lower respiratory specimens during the acute phase of infection. The lowest concentration of SARS-CoV-2 viral copies this assay can detect is 250 copies / mL. A negative result does not preclude SARS-CoV-2 infection and should not be used as the sole basis for treatment or other patient management decisions.  A negative result may occur with improper specimen collection / handling, submission of specimen other than nasopharyngeal swab, presence of viral mutation(s) within the areas targeted by this assay, and inadequate number of viral copies (<250 copies / mL). A negative result must be combined with clinical observations, patient history, and epidemiological information.  Fact Sheet for Patients:   https://www.patel.info/  Fact Sheet for Healthcare Providers: https://hall.com/  This test is not yet approved or  cleared by the [place] FDA and has been authorized for detection and/or diagnosis of SARS-CoV-2 by FDA under an Emergency Use Authorization (EUA).  This EUA will remain in effect (meaning this test can be used) for the duration of the COVID-19 declaration under Section 564(b)(1) of the Act, 21 U.S.C. section 360bbb-3(b)(1), unless the authorization is terminated or revoked sooner.  Performed at [facility] [place] [address], [place], [place] [address]   MRSA Next Gen by PCR, Nasal     Status: None   Collection Time: 07/14/21  4:36 AM   Specimen: Nasal Mucosa; Nasal Swab  Result Value Ref Range Status   MRSA by PCR Next Gen  NOT DETECTED NOT DETECTED Final    Comment: (NOTE) The GeneXpert MRSA Assay (FDA approved for NASAL specimens only), is one component of a comprehensive MRSA colonization surveillance program. It is not intended to diagnose MRSA infection nor to guide or monitor treatment for MRSA infections. Test performance is not FDA approved in patients less than 55 years old. Performed at [facility], [place] [address] [place], [place] [address]       SIGNED:   Charlynne Cousins, MD  [facility] 07/17/2021, 9:18 AM Pager   If 7PM-7AM, please contact night-coverage [email or web address] Password [email or web address]

## 2021-07-17 NOTE — TOC Transition Note (Addendum)
Transition of Care [facility]) - CM/SW Discharge Note   Patient Details  Name: Kevin [name] MRN: [id number] Date of Birth: 11-30-1955  Transition of Care [facility]) CM/SW Contact:  Ross Ludwig, LCSW Phone Number: 07/17/2021, 10:19 AM   Clinical Narrative:     Patient will be discharging back home with family and spouse.  Patient was concerned about the cost of his medications, he has Medicaid, so they should not be very expensive.  CSW informed bed side nurse to have patient call pharmacies to get prices or just take his hard scripts to any pharmacy and they can tell him the price.  CSW signing off, please reconsult if social work needs arise.   Final next level of care: Home/Self Care Barriers to Discharge: Barriers Resolved   Patient Goals and CMS Choice Patient states their goals for this hospitalization and ongoing recovery are:: To return back home CMS Medicare.gov Compare Post Acute Care list provided to:: Patient Choice offered to / list presented to : Patient  Discharge Placement                       Discharge Plan and Services                                     Social Determinants of Health (SDOH) Interventions     Readmission Risk Interventions     View : No data to display.

## 2021-07-17 NOTE — Plan of Care (Addendum)
His echo was normal. As per my note from 6/3. I don't suspect this is cardiac. See recommendations from 6/3. Can continue to uptitrate BP medications, can increase his coreg. I can follow peripherally, don't hesitate to reach out for questions.

## 2021-07-17 NOTE — Plan of Care (Signed)

## 2021-07-18 ENCOUNTER — Other Ambulatory Visit (HOSPITAL_COMMUNITY): Payer: Self-pay

## 2021-07-26 ENCOUNTER — Encounter: Payer: Self-pay | Admitting: *Deleted

## 2021-07-26 ENCOUNTER — Ambulatory Visit
Admission: RE | Admit: 2021-07-26 | Discharge: 2021-07-26 | Disposition: A | Discharge status: Home / Self Care | Payer: Medicaid Other | Source: Ambulatory Visit | Attending: Radiology | Admitting: Radiology

## 2021-07-26 DIAGNOSIS — N2889 Other specified disorders of kidney and ureter: Secondary | ICD-10-CM

## 2021-07-26 HISTORY — PX: IR RADIOLOGIST EVAL & MGMT: IMG5224

## 2021-07-26 NOTE — Progress Notes (Signed)
Chief Complaint: Patient was seen in consultation today for follow-up of right renal cryoablation.  Referring Physician(s): Ellison Hughs  History of Present Illness: Kevin [name] is a 66 y.o. 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  Patient had a CT in March 2023 for weight loss, rectal bleeding and abdominal pain that found of an indeterminate right renal lesion.  MRI from 05/14/2021 demonstrated a 1.2 central lesion that was suspicious for a low-grade papillary renal cell carcinoma.  Patient underwent biopsy and cryoablation of the right renal lesion on 06/29/2021.  Pathology was compatible with a papillary renal cell carcinoma, nuclear grade 2.  Patient presented to the emergency department approximately 9 days after the cryoablation with right flank pain and CT did not show any complicating features.  I followed up with the patient on 07/12/2021 and he said that his pain had almost completely subsided.  At that time he was rating his pain as 3 out of 10.  Unfortunately, the patient presented again to the emergency department on 07/14/2021 with shortness of breath and he was admitted with hypertensive crisis.  Patient was discharged from the hospital on 07/17/2021.  Patient has chronic shortness of breath.  He is still complaining of abdominal pain.  He says the pain is located along the anterior and lateral aspect of the right abdomen not at the right flank or treatment site.  Patient says that his undergarments and pants irritate this area.  No significant change in his appetite or weight.  He denies fevers or chills.  Shortness of breath is similar to his baseline.  No new chest pain.   No bowel or urinary symptoms.  He denies hematuria.  Patient states that he did not  take his blood pressure medicine yet today.  He is not regularly checking his blood pressure at home.  Past Medical History:  Diagnosis Date   AIDS (acquired immune deficiency syndrome) ([place]) 08/17/2016   Chronic diastolic CHF (congestive heart failure), NYHA class 3 (HCC) 01/2016   Chronic lower back pain    CKD (chronic kidney disease), stage III (HCC)    COPD (chronic obstructive pulmonary disease) (HCC)    Gout    "forearms, hands, ankles, feet" (06/05/2016)   Headache    "weekly" (06/05/2016)   Heart murmur    Hypertension    Hypertensive crisis 08/15/2017   OSA on CPAP    PAD (peripheral artery disease) (HCC)    PAF (paroxysmal atrial fibrillation) ([place]) 01/2016   Papillary renal cell carcinoma ([place]) 06/15/2021    Past Surgical History:  Procedure Laterality Date   IR RADIOLOGIST EVAL & MGMT  05/31/2021   IR RADIOLOGIST EVAL & MGMT  07/26/2021   LEFT HEART CATH AND CORONARY ANGIOGRAPHY N/A 10/23/2016   Procedure: LEFT HEART CATH AND CORONARY ANGIOGRAPHY;  Surgeon: Leonie Man, MD;  Location: [facility];  Service: Cardiovascular;  Laterality: N/A;   LOWER EXTREMITY ANGIOGRAPHY N/A 07/17/2016   Procedure: Lower Extremity Angiography;  Surgeon: Lorretta Harp, MD;  Location: [facility];  Service: Cardiovascular;  Laterality: N/A;   LOWER EXTREMITY INTERVENTION N/A 06/05/2016   Procedure: Lower Extremity Intervention;  Surgeon: Lorretta Harp, MD;  Location: [facility];  Service: Cardiovascular;  Laterality: N/A;   PERIPHERAL VASCULAR ATHERECTOMY Right 07/17/2016   Procedure: Peripheral Vascular Atherectomy;  Surgeon: Lorretta Harp, MD;  Location: [facility];  Service: Cardiovascular;  Laterality: Right;  SFA   PERIPHERAL VASCULAR INTERVENTION  06/05/2016   Procedure: Peripheral Vascular Intervention;  Surgeon: Lorretta Harp, MD;  Location: [facility];  Service: Cardiovascular;;  left SFA   RADIOLOGY WITH ANESTHESIA Right 06/29/2021    Procedure: RIGHT RENAL CRYOABLATION;  Surgeon: Markus Daft, MD;  Location: [facility];  Service: Anesthesiology;  Laterality: Right;    Allergies: Patient has no known allergies.  Medications: Prior to Admission medications   Medication Sig Start Date End Date Taking? Authorizing Provider  albuterol (VENTOLIN HFA) 108 (90 Base) MCG/ACT inhaler Inhale 2 puffs into the lungs every 6 (six) hours as needed for wheezing or shortness of breath. Patient taking differently: Inhale 2 puffs into the lungs 2 (two) times daily as needed for wheezing or shortness of breath. 04/19/21   Raspet, Derry Skill, PA-C  aspirin EC 81 MG tablet Take 1 tablet (81 mg total) by mouth daily. Swallow whole. 07/17/21   Charlynne Cousins, MD  atorvastatin (LIPITOR) 40 MG tablet Take 1 tablet (40 mg total) by mouth daily. 07/17/21   Charlynne Cousins, MD  carvedilol (COREG) 3.125 MG tablet Take 1 tablet (3.125 mg total) by mouth 2 (two) times daily. 07/17/21 07/17/22  Charlynne Cousins, MD  dolutegravir-lamiVUDine (DOVATO) 50-300 MG tablet Take 1 tablet by mouth daily. 06/15/21   Truman Hayward, MD  escitalopram (LEXAPRO) 10 MG tablet Take 20 mg by mouth daily. 06/16/21   [provider]  fluticasone furoate-vilanterol (BREO ELLIPTA) 200-25 MCG/ACT AEPB Inhale 1 puff into the lungs daily. 07/18/21   Charlynne Cousins, MD  isosorbide-hydrALAZINE (BIDIL) 20-37.5 MG tablet Take 1 tablet by mouth 3 (three) times daily. 07/17/21   Charlynne Cousins, MD  morphine (MSIR) 15 MG tablet Take 1 tablet (15 mg total) by mouth every 6 (six) hours as needed for severe pain. Patient not taking: Reported on 07/13/2021 07/10/21   Varney Biles, MD  Multiple Vitamin (MULTIVITAMIN) tablet Take 1 tablet by mouth daily.    [provider]  valsartan-hydrochlorothiazide (DIOVAN-HCT) 80-12.5 MG tablet Take 1 tablet by mouth daily. 07/17/21   Charlynne Cousins, MD     Family History  Problem Relation Age of Onset   High blood  pressure Mother    Lupus Mother     Social History   Socioeconomic History   Marital status: Significant Other    Spouse name: Not on file   Number of children: Not on file   Years of education: Not on file   Highest education level: Not on file  Occupational History   Not on file  Tobacco Use   Smoking status: Every Day    Packs/day: 0.10    Years: 42.00    Total pack years: 4.20    Types: Cigarettes, E-cigarettes   Smokeless tobacco: Never   Tobacco comments:    1-2 cigarettes a day; vaping  Vaping Use   Vaping Use: Every day   Substances: Nicotine, Flavoring  Substance and Sexual Activity   Alcohol use: Not Currently    Alcohol/week: 2.0 standard drinks of alcohol    Types: 2 Cans of beer per week    Comment: rare   Drug use: Not Currently    Comment: rare   Sexual activity: Not Currently    Comment: declined condoms  Other Topics Concern   Not on file  Social History Narrative  Not on file   Social Determinants of Health   Financial Resource Strain: Not on file  Food Insecurity: Not on file  Transportation Needs: Not on file  Physical Activity: Not on file  Stress: Not on file  Social Connections: Not on file    ECOG Status: 1 - Symptomatic but completely ambulatory  Review of Systems: A 12 point ROS discussed and pertinent positives are indicated in the HPI above.  All other systems are negative.  Review of Systems  Constitutional: Negative.   Respiratory:  Positive for shortness of breath.   Cardiovascular:  Negative for chest pain.  Gastrointestinal:  Positive for abdominal pain.  Genitourinary: Negative.     Vital Signs: BP (!) 175/90 (BP Location: Left Arm)   Pulse 67   SpO2 100%   Physical Exam Constitutional:      Appearance: Normal appearance.  Cardiovascular:     Rate and Rhythm: Normal rate and regular rhythm.  Pulmonary:     Effort: Pulmonary effort is normal.     Breath sounds: Normal breath sounds.  Abdominal:     General:  Abdomen is flat. Bowel sounds are normal.     Palpations: Abdomen is soft.     Tenderness: There is abdominal tenderness.     Comments: Tenderness along the anterior and anterolateral right lower abdomen.  No palpable abnormality in the right lateral abdomen.  Mild fullness at the umbilicus compatible with patient's known small periumbilical hernia.  Neurological:     Mental Status: He is alert.        Imaging: IR Radiologist Eval & Mgmt  Result Date: 07/26/2021 Please refer to notes tab for details about interventional procedure. (Op Note)  ECHOCARDIOGRAM COMPLETE  Result Date: 07/16/2021    ECHOCARDIOGRAM REPORT   Patient Name:   Kevin [name] Date of Exam: 07/16/2021 Medical Rec #:  [id number]       Height:       72.0 in Accession #:    [id number]      Weight:       147.7 lb Date of Birth:  Apr 15, 1955       BSA:          1.873 m Patient Age:    60 years        BP:           132/85 mmHg Patient Gender: M               HR:           51 bpm. Exam Location:  Inpatient Procedure: 2D Echo, Cardiac Doppler and Color Doppler Indications:    Dyspnea  History:        Patient has prior history of Echocardiogram examinations. CHF,                 CAD, COPD, Arrythmias:Atrial Fibrillation; Risk Factors:Sleep                 Apnea and Hypertension.  Sonographer:    Jyl Heinz Referring Phys: [place]  1. Left ventricular ejection fraction, by estimation, is 60 to 65%. The left ventricle has normal function. The left ventricle has no regional wall motion abnormalities. moderate to severe concentric left ventricular hypertrophy. Left ventricular diastolic parameters are consistent with Grade I diastolic dysfunction (impaired relaxation).  2. Right ventricular systolic function is normal. The right ventricular size is normal. Tricuspid regurgitation signal is inadequate for assessing PA pressure.  3. No evidence of  mitral valve regurgitation.  4. Aortic valve regurgitation is not  visualized.  5. The inferior vena cava is normal in size with greater than 50% respiratory variability, suggesting right atrial pressure of 3 mmHg. Comparison(s): No significant change from prior study. FINDINGS  Left Ventricle: Left ventricular ejection fraction, by estimation, is 60 to 65%. The left ventricle has normal function. The left ventricle has no regional wall motion abnormalities. The left ventricular internal cavity size was normal in size. Moderate  to severe concentric left ventricular hypertrophy. Left ventricular diastolic parameters are consistent with Grade I diastolic dysfunction (impaired relaxation). Right Ventricle: The right ventricular size is normal. Right ventricular systolic function is normal. Tricuspid regurgitation signal is inadequate for assessing PA pressure. Left Atrium: Left atrial size was normal in size. Right Atrium: Right atrial size was normal in size. Pericardium: There is no evidence of pericardial effusion. Mitral Valve: No evidence of mitral valve regurgitation. Tricuspid Valve: Tricuspid valve regurgitation is not demonstrated. Aortic Valve: Aortic valve regurgitation is not visualized. Aortic valve peak gradient measures 4.8 mmHg. Pulmonic Valve: Pulmonic valve regurgitation is not visualized. Venous: The inferior vena cava is normal in size with greater than 50% respiratory variability, suggesting right atrial pressure of 3 mmHg. IAS/Shunts: No atrial level shunt detected by color flow Doppler.  LEFT VENTRICLE PLAX 2D LVIDd:         4.80 cm      Diastology LVIDs:         3.00 cm      LV e' medial:    5.44 cm/s LV PW:         1.40 cm      LV E/e' medial:  10.6 LV IVS:        1.30 cm      LV e' lateral:   5.22 cm/s LVOT diam:     2.20 cm      LV E/e' lateral: 11.1 LV SV:         71 LV SV Index:   38 LVOT Area:     3.80 cm  LV Volumes (MOD) LV vol d, MOD A2C: 132.0 ml LV vol d, MOD A4C: 114.0 ml LV vol s, MOD A2C: 50.4 ml LV vol s, MOD A4C: 39.9 ml LV SV MOD A2C:      81.6 ml LV SV MOD A4C:     114.0 ml LV SV MOD BP:      80.5 ml RIGHT VENTRICLE            IVC RV Basal diam:  3.10 cm    IVC diam: 1.20 cm RV S prime:     9.90 cm/s TAPSE (M-mode): 2.7 cm LEFT ATRIUM             Index        RIGHT ATRIUM           Index LA diam:        3.50 cm 1.87 cm/m   RA Area:     14.90 cm LA Vol (A2C):   78.7 ml 42.02 ml/m  RA Volume:   32.30 ml  17.24 ml/m LA Vol (A4C):   49.7 ml 26.53 ml/m LA Biplane Vol: 64.7 ml 34.54 ml/m  AORTIC VALVE AV Area (Vmax): 3.39 cm AV Vmax:        109.00 cm/s AV Peak Grad:   4.8 mmHg LVOT Vmax:      97.30 cm/s LVOT Vmean:     72.300 cm/s LVOT VTI:  0.187 m  AORTA Ao Root diam: 3.60 cm Ao Asc diam:  3.20 cm MITRAL VALVE MV Area (PHT): 3.45 cm    SHUNTS MV Decel Time: 220 msec    Systemic VTI:  0.19 m MV E velocity: 57.80 cm/s  Systemic Diam: 2.20 cm MV A velocity: 59.10 cm/s MV E/A ratio:  0.98 Mary Scientist, physiological signed by Phineas Inches Signature Date/Time: 07/16/2021/12:37:28 PM    Final    VAS [name] LOWER EXTREMITY VENOUS (DVT)  Result Date: 07/14/2021  Lower Venous DVT Study Patient Name:  Kevin [name]  Date of Exam:   07/14/2021 Medical Rec #: [id number]        Accession #:    [id number] Date of Birth: 22-Oct-1955        Patient Gender: M Patient Age:   69 years Exam Location:  [facility] Procedure:      VAS [name] LOWER EXTREMITY VENOUS (DVT) Referring Phys: Bess Harvest ORTIZ --------------------------------------------------------------------------------  Indications: SOB, and Swelling.  Anticoagulation: Heparin. Comparison Study: 02-18-2016 Prior bilateral lower extremity venous was negative                   for DVT. Multiple prior arterial examinations. Performing Technologist: Darlin Coco RDMS, RVT  Examination Guidelines: A complete evaluation includes B-mode imaging, spectral Doppler, color Doppler, and power Doppler as needed of all accessible portions of each vessel. Bilateral testing is considered an integral part of a  complete examination. Limited examinations for reoccurring indications may be performed as noted. The reflux portion of the exam is performed with the patient in reverse Trendelenburg.  +---------+---------------+---------+-----------+----------+--------------+ RIGHT    CompressibilityPhasicitySpontaneityPropertiesThrombus Aging +---------+---------------+---------+-----------+----------+--------------+ CFV      Full           Yes      Yes                                 +---------+---------------+---------+-----------+----------+--------------+ SFJ      Full                                                        +---------+---------------+---------+-----------+----------+--------------+ FV Prox  Full                                                        +---------+---------------+---------+-----------+----------+--------------+ FV Mid   Full                                                        +---------+---------------+---------+-----------+----------+--------------+ FV DistalFull                                                        +---------+---------------+---------+-----------+----------+--------------+ PFV      Full                                                        +---------+---------------+---------+-----------+----------+--------------+  POP      Full           Yes      Yes                                 +---------+---------------+---------+-----------+----------+--------------+ PTV      Full                                                        +---------+---------------+---------+-----------+----------+--------------+ PERO     Full                                                        +---------+---------------+---------+-----------+----------+--------------+ Gastroc  Full                                                        +---------+---------------+---------+-----------+----------+--------------+    +---------+---------------+---------+-----------+----------+--------------+ LEFT     CompressibilityPhasicitySpontaneityPropertiesThrombus Aging +---------+---------------+---------+-----------+----------+--------------+ CFV      Full           Yes      Yes                                 +---------+---------------+---------+-----------+----------+--------------+ SFJ      Full                                                        +---------+---------------+---------+-----------+----------+--------------+ FV Prox  Full                                                        +---------+---------------+---------+-----------+----------+--------------+ FV Mid   Full                                                        +---------+---------------+---------+-----------+----------+--------------+ FV DistalFull                                                        +---------+---------------+---------+-----------+----------+--------------+ PFV      Full                                                        +---------+---------------+---------+-----------+----------+--------------+  POP      Full           Yes      Yes                                 +---------+---------------+---------+-----------+----------+--------------+ PTV      Full                                                        +---------+---------------+---------+-----------+----------+--------------+ PERO     Full                                                        +---------+---------------+---------+-----------+----------+--------------+ Gastroc  Full                                                        +---------+---------------+---------+-----------+----------+--------------+     Summary: RIGHT: - There is no evidence of deep vein thrombosis in the lower extremity.  - No cystic structure found in the popliteal fossa.  LEFT: - There is no evidence of deep vein thrombosis in  the lower extremity.  - No cystic structure found in the popliteal fossa.  *See table(s) above for measurements and observations. Electronically signed by Orlie Pollen on 07/14/2021 at 5:23:19 PM.    Final    NM Pulmonary Perfusion  Result Date: 07/14/2021 CLINICAL DATA:  Chest pain, shortness of breath EXAM: NUCLEAR MEDICINE PERFUSION LUNG SCAN TECHNIQUE: Perfusion images were obtained in multiple projections after intravenous injection of radiopharmaceutical. Ventilation scans intentionally deferred if perfusion scan and chest x-ray adequate for interpretation during COVID 19 epidemic. RADIOPHARMACEUTICALS:  4.03 mCi Tc-44mMAA IV COMPARISON:  Chest radiograph done on 07/13/2021 FINDINGS: There are ill-defined small foci of decreased perfusion in the posterior left mid and left lower lung fields. There is moderate sized area of decreased perfusion in the posterior right lower lung fields. There are no definite wedge-shaped perfusion defects. Lung fields are essentially clear in the previous chest radiographs. IMPRESSION: There are small to moderate sized foci of decreased tracer uptake in the posterior left mid and left lower lung fields and posterior right lower lung fields. Study is indeterminate to evaluate for pulmonary embolism. If there is continued clinical suspicion for PE follow-up CT angiogram and/or venous Doppler examination of both lower extremities may be considered. Electronically Signed   By: PElmer PickerM.D.   On: 07/14/2021 12:44   CT Renal Stone Study  Result Date: 07/14/2021 CLINICAL DATA:  Right flank pain EXAM: CT ABDOMEN AND PELVIS WITHOUT CONTRAST TECHNIQUE: Multidetector CT imaging of the abdomen and pelvis was performed following the standard protocol without IV contrast. RADIATION DOSE REDUCTION: This exam was performed according to the departmental dose-optimization program which includes automated exposure control, adjustment of the mA and/or kV according to patient size  and/or use of iterative reconstruction technique. COMPARISON:  07/09/2021, MRI 05/14/2021 FINDINGS: Lower chest: No acute abnormality Hepatobiliary: No focal hepatic  abnormality. Gallbladder unremarkable. Pancreas: No focal abnormality or ductal dilatation. Spleen: No focal abnormality.  Normal size. Adrenals/Urinary Tract: Low-density lesion again noted posteriorly in the midpole of the right kidney, unchanged since recent study. This has enlarged since prior MRI from 05/14/2021. This recently has undergone biopsy and cryoablation, likely the cause of the increased size. No visible surrounding hematoma. No renal or ureteral stones. No hydronephrosis. Adrenal glands and urinary bladder unremarkable. Stomach/Bowel: Normal appendix. Stomach, large and small bowel grossly unremarkable. Vascular/Lymphatic: No evidence of aneurysm or adenopathy. Scattered aortic calcifications. Reproductive: No visible focal abnormality. Other: No free fluid or free air. Musculoskeletal: No acute bony abnormality. IMPRESSION: 3.8 cm low-density lesion posteriorly in the midpole of the right kidney which cannot be characterized on this noncontrast study. This appears significantly larger when compared to prior MRI. This has recently undergone biopsy and cryoablation, likely the cause of the apparent increased size of the lesion. No visible complicating feature. No renal or ureteral stones.  No hydronephrosis. Aortic atherosclerosis. Electronically Signed   By: Rolm Baptise M.D.   On: 07/14/2021 00:44   DG Chest 2 View  Result Date: 07/13/2021 CLINICAL DATA:  Shortness of breath EXAM: CHEST - 2 VIEW COMPARISON:  06/29/2021 FINDINGS: The heart size and mediastinal contours are within normal limits. Both lungs are clear. The visualized skeletal structures are unremarkable. IMPRESSION: Negative. Electronically Signed   By: Rolm Baptise M.D.   On: 07/13/2021 22:06   US Abdomen Complete  Result Date: 07/10/2021 CLINICAL DATA:  Right  upper quadrant abdominal pain and right flank pain for 5 days. EXAM: ABDOMEN ULTRASOUND COMPLETE COMPARISON:  Prior CTs, most recent dated 07/09/2021. FINDINGS: Gallbladder: No gallstones or wall thickening visualized. No sonographic Murphy sign noted by sonographer. Common bile duct: Diameter: 3 mm Liver: No focal lesion identified. Within normal limits in parenchymal echogenicity. Portal vein is patent on color Doppler imaging with normal direction of blood flow towards the liver. IVC: No abnormality visualized. Pancreas: Visualized portion unremarkable. Spleen: Partly obscured but overall normal in size and appearance. Right Kidney: Length: 10.1 cm. Normal overall parenchymal echogenicity. Vague hypoechoic area in the renal sinus region of the midpole, 2.1 x 1.6 x 2.0 cm. Tiny cortical cystic lesion, 5 mm, midpole. No other masses, no stones and no hydronephrosis. Left Kidney: Length: 9.8 cm. Normal parenchymal echogenicity. Simple cyst arises from the lower pole, 9 mm. No other masses, no stones and no hydronephrosis. Abdominal aorta: No aneurysm visualized. Other findings: None. IMPRESSION: 1. No acute findings.  Normal gallbladder.  No bile duct dilation. 2. Hypoechoic area within the midpole the right kidney, corresponding to the hypoechoic area noted on the previous day's CT. This also corresponds to a lesion noted on MRI dated 05/14/2021 that subsequently underwent CT-guided ablation. 3. 5 mm cystic lesion in the right kidney, too small to fully characterize but likely a simple cyst, and corresponding to a simple cyst present on the prior MRI. 4. 9 mm simple cyst arising from the left kidney. Electronically Signed   By: Lajean Manes M.D.   On: 07/10/2021 11:02   CT RENAL STONE STUDY  Result Date: 07/09/2021 CLINICAL DATA:  Flank pain, kidney stone suspected. EXAM: CT ABDOMEN AND PELVIS WITHOUT CONTRAST TECHNIQUE: Multidetector CT imaging of the abdomen and pelvis was performed following the standard  protocol without IV contrast. RADIATION DOSE REDUCTION: This exam was performed according to the departmental dose-optimization program which includes automated exposure control, adjustment of the mA and/or kV according to patient  size and/or use of iterative reconstruction technique. COMPARISON:  CT examination dated April 15, 2021 FINDINGS: Lower chest: No acute abnormality. Hepatobiliary: No focal liver abnormality is seen. No gallstones, gallbladder wall thickening, or biliary dilatation. Pancreas: Unremarkable. No pancreatic ductal dilatation or surrounding inflammatory changes. Spleen: Normal in size without focal abnormality. Adrenals/Urinary Tract: Adrenal glands are unremarkable. Punctate nonobstructing calculus in the left renal pelvis. No evidence of hydronephrosis or ureteral calculus. Hypodense complex cystic structure in the interpolar region of the right kidney, not well visualized on this noncontrast examination, likely proteinaceous cyst. Bladder is unremarkable. Stomach/Bowel: Stomach is within normal limits. Appendix appears normal. No evidence of bowel wall thickening, distention, or inflammatory changes. Vascular/Lymphatic: Aortic atherosclerosis. No enlarged abdominal or pelvic lymph nodes. Reproductive: Prostate is unremarkable. Other: No abdominal wall hernia or abnormality. No abdominopelvic ascites. Musculoskeletal: Mild degenerative disease of the lumbar spine. No acute osseous abnormality. IMPRESSION: 1. Punctate calculus in the interpolar region of the left kidney without evidence of hydronephrosis or ureteral calculus. 2. No evidence of right nephrolithiasis or hydronephrosis. Complex cyst in the posterior aspect of the inter polar region, unchanged, evaluation is limited on this noncontrast enhanced examination. 3. Bowel loops are normal in caliber. Normal appendix. No evidence of colitis or diverticulitis. 4.  No CT evidence of acute abdominal/pelvic process. Electronically Signed    By: Keane Police D.O.   On: 07/09/2021 12:46   CT GUIDE TISSUE ABLATION  Addendum Date: 06/30/2021   ADDENDUM REPORT: 06/30/2021 10:36 ADDENDUM: RADIATION DOSE REDUCTION: This exam was performed according to the departmental dose-optimization program which includes automated exposure control, adjustment of the mA and/or kV according to patient size and/or use of iterative reconstruction technique. Electronically Signed   By: Markus Daft M.D.   On: 06/30/2021 10:36   Result Date: 06/30/2021 INDICATION: 66 year old with a suspicious right renal lesion. Scheduled for image guided cryoablation and biopsy. EXAM: CT AND ULTRASOUND GUIDED CRYOABLATION OF RIGHT RENAL LESION IMAGE GUIDED BIOPSY OF RIGHT RENAL LESION COMPARISON:  None Available. MEDICATIONS: Rocephin 1 g; The antibiotic was administered in an appropriate time interval prior to needle puncture of the skin. ANESTHESIA/SEDATION: General - as administered by the Anesthesia department FLUOROSCOPY: None COMPLICATIONS: None immediate. TECHNIQUE: Informed written consent was obtained from the patient after a thorough discussion of the procedural risks, benefits and alternatives. All questions were addressed. A timeout was performed prior to the initiation of the procedure. Patient was intubated by anesthesia. Patient was placed prone. Right renal lesion was identified with ultrasound. CT images of the abdomen were also obtained. The right renal lesion was identified with CT. Right flank was prepped with chlorhexidine and sterile field was created. Maximal barrier sterile technique was utilized including caps, mask, sterile gowns, sterile gloves, sterile drape, hand hygiene and skin antiseptic. Using ultrasound guidance, 2 Cryocare PCS-17 needles were directed into the right kidney. Needles were repositioned multiple times and needles were directed along the long axis of the lesion using ultrasound guidance. One lesion was placed deeper into the kidney and the  other was placed more superficial near the periphery. Needle positioning was confirmed with CT. Subsequently, hydro dissection was performed using at dilute contrast through a [place] needle. Approximately 400 mL of hydro dissection was performed around the right kidney. Follow up CT images were obtained to evaluate the hydro dissection. Using ultrasound guidance, a 17 gauge coaxial needle was directed into the lesion and 2 core biopsies were obtained with an 18 gauge device. Specimens placed in  formalin. Cryoablation was performed with 9 minute freeze, 6 minute thaw and a second 9 minute freeze. Both needles removed after final 6 minute thaw. Final CT images were obtained. Bandages were placed. Patient was taken to the PACU for recovery. FINDINGS: Ultrasound and CT demonstrated a slightly exophytic lesion along the posterior right kidney. Lesion measured approximately 1.4 cm on CT. This lesion was hypoechoic on ultrasound. Due to the overlying rib, the lesion was best accessed using ultrasound guidance with the lesion elongated and needle was directed from a slightly caudal to cranial approach. Needles were placed in near parallel configuration. Hydro dissection was performed around the right kidney to protect the intercostal space. Ice balls were confirmed on CT images during the freeze. Ice ball appeared to be encompassing the suspicious lesion. There is probably a small amount of ice ball extending into the posterior intercostal space as well. No immediate bleeding or hematoma formations. IMPRESSION: Image guided biopsy and cryoablation of right renal lesion. Electronically Signed: By: Markus Daft M.D. On: 06/30/2021 08:02   CT BIOPSY  Addendum Date: 06/30/2021   ADDENDUM REPORT: 06/30/2021 10:36 ADDENDUM: RADIATION DOSE REDUCTION: This exam was performed according to the departmental dose-optimization program which includes automated exposure control, adjustment of the mA and/or kV according to  patient size and/or use of iterative reconstruction technique. Electronically Signed   By: Markus Daft M.D.   On: 06/30/2021 10:36   Result Date: 06/30/2021 INDICATION: 66 year old with a suspicious right renal lesion. Scheduled for image guided cryoablation and biopsy. EXAM: CT AND ULTRASOUND GUIDED CRYOABLATION OF RIGHT RENAL LESION IMAGE GUIDED BIOPSY OF RIGHT RENAL LESION COMPARISON:  None Available. MEDICATIONS: Rocephin 1 g; The antibiotic was administered in an appropriate time interval prior to needle puncture of the skin. ANESTHESIA/SEDATION: General - as administered by the Anesthesia department FLUOROSCOPY: None COMPLICATIONS: None immediate. TECHNIQUE: Informed written consent was obtained from the patient after a thorough discussion of the procedural risks, benefits and alternatives. All questions were addressed. A timeout was performed prior to the initiation of the procedure. Patient was intubated by anesthesia. Patient was placed prone. Right renal lesion was identified with ultrasound. CT images of the abdomen were also obtained. The right renal lesion was identified with CT. Right flank was prepped with chlorhexidine and sterile field was created. Maximal barrier sterile technique was utilized including caps, mask, sterile gowns, sterile gloves, sterile drape, hand hygiene and skin antiseptic. Using ultrasound guidance, 2 Cryocare PCS-17 needles were directed into the right kidney. Needles were repositioned multiple times and needles were directed along the long axis of the lesion using ultrasound guidance. One lesion was placed deeper into the kidney and the other was placed more superficial near the periphery. Needle positioning was confirmed with CT. Subsequently, hydro dissection was performed using at dilute contrast through a [place] needle. Approximately 400 mL of hydro dissection was performed around the right kidney. Follow up CT images were obtained to evaluate the hydro  dissection. Using ultrasound guidance, a 17 gauge coaxial needle was directed into the lesion and 2 core biopsies were obtained with an 18 gauge device. Specimens placed in formalin. Cryoablation was performed with 9 minute freeze, 6 minute thaw and a second 9 minute freeze. Both needles removed after final 6 minute thaw. Final CT images were obtained. Bandages were placed. Patient was taken to the PACU for recovery. FINDINGS: Ultrasound and CT demonstrated a slightly exophytic lesion along the posterior right kidney. Lesion measured approximately 1.4 cm on CT. This  lesion was hypoechoic on ultrasound. Due to the overlying rib, the lesion was best accessed using ultrasound guidance with the lesion elongated and needle was directed from a slightly caudal to cranial approach. Needles were placed in near parallel configuration. Hydro dissection was performed around the right kidney to protect the intercostal space. Ice balls were confirmed on CT images during the freeze. Ice ball appeared to be encompassing the suspicious lesion. There is probably a small amount of ice ball extending into the posterior intercostal space as well. No immediate bleeding or hematoma formations. IMPRESSION: Image guided biopsy and cryoablation of right renal lesion. Electronically Signed: By: Markus Daft M.D. On: 06/30/2021 08:02   DG Chest 1 View  Result Date: 06/29/2021 CLINICAL DATA:  Preop chest x-ray EXAM: CHEST  1 VIEW COMPARISON:  04/19/2021 FINDINGS: The heart size and mediastinal contours are within normal limits. Both lungs are clear. The visualized skeletal structures are unremarkable. IMPRESSION: No active disease. Electronically Signed   By: Franchot Gallo M.D.   On: 06/29/2021 09:57    Labs:  CBC: Recent Labs    07/13/21 2205 07/15/21 0507 07/16/21 0559 07/17/21 0535  WBC 11.8* 13.2* 10.5 16.3*  HGB 10.5* 9.6* 10.2* 9.9*  HCT 30.3* 28.7* 30.6* 29.9*  PLT 393 391 439* 422*    COAGS: Recent Labs     06/21/21 1442 07/14/21 0245  INR 1.1 1.2  APTT  --  33    BMP: Recent Labs    07/14/21 0703 07/15/21 0507 07/16/21 0559 07/17/21 0535  NA 137 138 136 138  K 4.4 4.0 3.8 4.6  CL 107 108 106 108  CO2 19* '23 23 23  '$ GLUCOSE 256* 104* 92 143*  BUN 25* 38* 59* 60*  CALCIUM 9.0 8.9 8.7* 8.8*  CREATININE 2.03* 2.51* 3.04* 2.91*  GFRNONAA 36* 28* 22* 23*    LIVER FUNCTION TESTS: Recent Labs    11/30/20 0208 06/15/21 0223 07/09/21 1215 07/14/21 0703  BILITOT 0.6 0.5 0.5 0.7  AST '25 13 15 23  '$ ALT '15 9 16 14  '$ ALKPHOS 75  --  50 64  PROT 7.9 7.5 6.6 7.9  ALBUMIN 3.3*  --  2.9* 3.5    TUMOR MARKERS: No results for input(s): "AFPTM", "CEA", "CA199", "CHROMGRNA" in the last 8760 hours.  Assessment and Plan:  66 year old with multiple medical problems including treated right papillary renal cell carcinoma.  The right renal lesion was treated with cryoablation on 06/29/2021.  Overall, the patient has well following the procedure although he has persistent right-sided abdominal pain.  Initially after the procedure, the patient's pain appeared to be more in the right flank but now he is complaining of pain along the lateral and anterior aspect of the right abdomen.  Despite a large amount of hydro dissection during the cryoablation procedure, the ice ball likely extended posteriorly into the intercostal space and patient symptoms could be associated with an intercostal nerve injury or irritation.  Explained to the patient that these nerve injuries can take months to improve or resolve.  No signs of postprocedure complications around the kidney based on follow-up CT examinations since the procedure.  With regards to the treated renal cell carcinoma, I told the patient that we would start surveillance.  Plan for follow-up MRI of the abdomen, with and without contrast, in 6 months.  If the follow-up MRI is negative for recurrence, we usually do 40-monthsurveillance for up to 5 years after the  procedure.  At this point, I am  more concerned about the patient's other medical problems including his hypertension and worsening renal function.  I emphasized the importance of taking his medications regularly and also checking his blood pressure regularly.  Plan for follow-up visit in 6 months after the MRI.  Thank you for this interesting consult.  I greatly enjoyed meeting ALAN DRUMMER and look forward to participating in their care.  A copy of this report was sent to the requesting provider on this date.  Electronically Signed: Burman Riis 07/26/2021, 1:15 PM   I spent a total of    15 Minutes in face to face in clinical consultation, greater than 50% of which was counseling/coordinating care for follow-up of renal cell carcinoma. patient ID: [name], male   DOB: 01/11/1956, 66 y.o.   MRN: [id number]

## 2021-07-27 ENCOUNTER — Telehealth (HOSPITAL_COMMUNITY): Payer: Self-pay | Admitting: Radiology

## 2021-07-27 NOTE — Telephone Encounter (Signed)
66 y.o. male outpatient. History of HIV, CHF, HTN. CKD, a fib, PAD. Found to have a right renal lesion s/p biopsy and cryoablation on 5.17.23 with IR Attending Dr. Markus Daft. Cytology resulted in papillary cell renal cell carcinoma. Patient reporting intermitted right flank pain since the procedure was performed. Epic notes show presented several times to the ED since the procedure. Patient seen in [facility] on 6.14.23 for 1 month post procedure follow up. Imaging reviewed by Dr. Anselm Pancoast and discussed with Patient at that time. Mr. Shrum was made aware that nerve pain may linger. Patient called today reporting right sided flank pain. Patient states that his pain is unresolved with 100  mg of tylenol taken q 4 hours and heat applied to the affected are.  Condition is made better with Vicodin. Condition is made worse by nothing., Patient is requesting Vicodin. Patient education provided medication usage and administration. Patient states that he has a primary care physician Dr. Neill Loft who previously prescribed his hypertension medication. Patient states that he will reach out to his previous PMD for medication refills and pain control.

## 2021-08-08 ENCOUNTER — Other Ambulatory Visit (HOSPITAL_COMMUNITY): Payer: Self-pay

## 2021-08-08 ENCOUNTER — Encounter: Payer: Self-pay | Admitting: Infectious Disease

## 2021-08-08 ENCOUNTER — Other Ambulatory Visit: Payer: Self-pay

## 2021-08-08 ENCOUNTER — Ambulatory Visit (INDEPENDENT_AMBULATORY_CARE_PROVIDER_SITE_OTHER): Payer: Medicaid Other | Admitting: Infectious Disease

## 2021-08-08 VITALS — BP 165/80 | HR 63 | Temp 98.0°F

## 2021-08-08 DIAGNOSIS — I10 Essential (primary) hypertension: Secondary | ICD-10-CM | POA: Diagnosis not present

## 2021-08-08 DIAGNOSIS — N1832 Chronic kidney disease, stage 3b: Secondary | ICD-10-CM

## 2021-08-08 DIAGNOSIS — N289 Disorder of kidney and ureter, unspecified: Secondary | ICD-10-CM

## 2021-08-08 DIAGNOSIS — I480 Paroxysmal atrial fibrillation: Secondary | ICD-10-CM | POA: Diagnosis not present

## 2021-08-08 DIAGNOSIS — Z720 Tobacco use: Secondary | ICD-10-CM

## 2021-08-08 DIAGNOSIS — B20 Human immunodeficiency virus [HIV] disease: Secondary | ICD-10-CM

## 2021-08-08 DIAGNOSIS — I25110 Atherosclerotic heart disease of native coronary artery with unstable angina pectoris: Secondary | ICD-10-CM

## 2021-08-08 MED ORDER — DOVATO 50-300 MG PO TABS
1.0000 | ORAL_TABLET | Freq: Every day | ORAL | 11 refills | Status: DC
  Filled 2021-08-08 – 2021-08-19 (×2): qty 30, 30d supply, fill #0
  Filled 2021-10-07: qty 30, 30d supply, fill #1
  Filled 2021-11-18: qty 30, 30d supply, fill #2
  Filled 2021-12-16: qty 30, 30d supply, fill #3
  Filled 2022-01-18 – 2022-02-01 (×2): qty 30, 30d supply, fill #4
  Filled 2022-03-16: qty 30, 30d supply, fill #5
  Filled 2022-05-29: qty 30, 30d supply, fill #6

## 2021-08-08 MED ORDER — ISOSORB DINITRATE-HYDRALAZINE 20-37.5 MG PO TABS: 1.0000 | ORAL_TABLET | Freq: Three times a day (TID) | ORAL | 11 refills | Status: DC

## 2021-08-08 MED ORDER — ISOSORB DINITRATE-HYDRALAZINE 20-37.5 MG PO TABS
1.0000 | ORAL_TABLET | Freq: Three times a day (TID) | ORAL | 11 refills | Status: DC
  Filled 2021-08-08: qty 90, 30d supply, fill #0

## 2021-08-09 ENCOUNTER — Other Ambulatory Visit (HOSPITAL_COMMUNITY): Payer: Self-pay

## 2021-08-10 LAB — HIV-1 RNA QUANT-NO REFLEX-BLD
HIV 1 RNA Quant: NOT DETECTED Copies/mL
HIV-1 RNA Quant, Log: NOT DETECTED Log cps/mL

## 2021-08-11 ENCOUNTER — Other Ambulatory Visit (HOSPITAL_COMMUNITY): Payer: Self-pay

## 2021-08-15 ENCOUNTER — Other Ambulatory Visit (HOSPITAL_COMMUNITY): Payer: Self-pay

## 2021-08-19 ENCOUNTER — Other Ambulatory Visit (HOSPITAL_COMMUNITY): Payer: Self-pay

## 2021-08-20 ENCOUNTER — Other Ambulatory Visit (HOSPITAL_COMMUNITY): Payer: Self-pay

## 2021-09-03 ENCOUNTER — Emergency Department (HOSPITAL_COMMUNITY)
Admission: EM | Admit: 2021-09-03 | Discharge: 2021-09-04 | Disposition: A | Discharge status: Home / Self Care | Payer: Medicaid Other | Attending: Emergency Medicine | Admitting: Emergency Medicine

## 2021-09-03 ENCOUNTER — Other Ambulatory Visit: Payer: Self-pay

## 2021-09-03 ENCOUNTER — Encounter (HOSPITAL_COMMUNITY): Payer: Self-pay

## 2021-09-03 ENCOUNTER — Emergency Department (HOSPITAL_COMMUNITY): Payer: Medicaid Other

## 2021-09-03 DIAGNOSIS — Z21 Asymptomatic human immunodeficiency virus [HIV] infection status: Secondary | ICD-10-CM | POA: Diagnosis not present

## 2021-09-03 DIAGNOSIS — U071 COVID-19: Secondary | ICD-10-CM | POA: Insufficient documentation

## 2021-09-03 DIAGNOSIS — R519 Headache, unspecified: Secondary | ICD-10-CM | POA: Diagnosis present

## 2021-09-03 LAB — CBC WITH DIFFERENTIAL/PLATELET
Abs Immature Granulocytes: 0.01 10*3/uL (ref 0.00–0.07)
Basophils Absolute: 0 10*3/uL (ref 0.0–0.1)
Basophils Relative: 0 %
Eosinophils Absolute: 0.3 10*3/uL (ref 0.0–0.5)
Eosinophils Relative: 4 %
HCT: 29 % — ABNORMAL LOW (ref 39.0–52.0)
Hemoglobin: 9.9 g/dL — ABNORMAL LOW (ref 13.0–17.0)
Immature Granulocytes: 0 %
Lymphocytes Relative: 10 %
Lymphs Abs: 0.6 10*3/uL — ABNORMAL LOW (ref 0.7–4.0)
MCH: 34.4 pg — ABNORMAL HIGH (ref 26.0–34.0)
MCHC: 34.1 g/dL (ref 30.0–36.0)
MCV: 100.7 fL — ABNORMAL HIGH (ref 80.0–100.0)
Monocytes Absolute: 0.7 10*3/uL (ref 0.1–1.0)
Monocytes Relative: 12 %
Neutro Abs: 4.7 10*3/uL (ref 1.7–7.7)
Neutrophils Relative %: 74 %
Platelets: 272 10*3/uL (ref 150–400)
RBC: 2.88 MIL/uL — ABNORMAL LOW (ref 4.22–5.81)
RDW: 14.9 % (ref 11.5–15.5)
WBC: 6.3 10*3/uL (ref 4.0–10.5)
nRBC: 0 % (ref 0.0–0.2)

## 2021-09-03 LAB — COMPREHENSIVE METABOLIC PANEL
ALT: 11 U/L (ref 0–44)
AST: 14 U/L — ABNORMAL LOW (ref 15–41)
Albumin: 3 g/dL — ABNORMAL LOW (ref 3.5–5.0)
Alkaline Phosphatase: 46 U/L (ref 38–126)
Anion gap: 5 (ref 5–15)
BUN: 18 mg/dL (ref 8–23)
CO2: 17 mmol/L — ABNORMAL LOW (ref 22–32)
Calcium: 7.6 mg/dL — ABNORMAL LOW (ref 8.9–10.3)
Chloride: 117 mmol/L — ABNORMAL HIGH (ref 98–111)
Creatinine, Ser: 1.79 mg/dL — ABNORMAL HIGH (ref 0.61–1.24)
GFR, Estimated: 42 mL/min — ABNORMAL LOW (ref >60)
Glucose, Bld: 83 mg/dL (ref 70–99)
Potassium: 3.3 mmol/L — ABNORMAL LOW (ref 3.5–5.1)
Sodium: 139 mmol/L (ref 135–145)
Total Bilirubin: 0.2 mg/dL — ABNORMAL LOW (ref 0.3–1.2)
Total Protein: 6.1 g/dL — ABNORMAL LOW (ref 6.5–8.1)

## 2021-09-03 LAB — LACTIC ACID, PLASMA: Lactic Acid, Venous: 0.7 mmol/L (ref 0.5–1.9)

## 2021-09-03 MED ORDER — SODIUM CHLORIDE 0.9 % IV BOLUS: 1000.0000 mL | Freq: Once | INTRAVENOUS | Status: DC

## 2021-09-03 MED ORDER — SODIUM CHLORIDE 0.9 % IV BOLUS
500.0000 mL | Freq: Once | INTRAVENOUS | Status: AC
  Administered 2021-09-03: 500 mL via INTRAVENOUS

## 2021-09-03 MED ORDER — ACETAMINOPHEN 500 MG PO TABS
1000.0000 mg | ORAL_TABLET | Freq: Once | ORAL | Status: AC
  Administered 2021-09-03: 1000 mg via ORAL
  Filled 2021-09-03: qty 2

## 2021-09-03 NOTE — ED Notes (Signed)
Patient transported to X-ray 

## 2021-09-03 NOTE — ED Notes (Signed)
Patient given 1 blanket and call bell.

## 2021-09-03 NOTE — ED Triage Notes (Signed)
PER EMS: pt is from home with c/o headache, low grade fever, cough, generalized body aches - onset last night. A&OX4, pt speaking in clear complete sentences. BP-197/105, did not take his BP meds today because it gives him a headache HR-73, 16-RR, CBG-107, 20g Lhand

## 2021-09-03 NOTE — ED Provider Notes (Incomplete)
[facility] Emergency Department Provider Note MRN:  [id number]  Arrival date & time: 09/04/21     Chief Complaint   Headache   History of Present Illness   Kevin [name] is a 66 y.o. year-old male presents to the ED with chief complaint of fever and cough.  States that he began feeling bad last night.  He states that his cough is non-productive, but has been worsening.  He also complains of headache and generalized body aches.  He denies vomiting or diarrhea.  Denies any confusion.    History of HIV, last viral load in June was undetectable.  History provided by patient.   Review of Systems  Pertinent review of systems noted in HPI.    Physical Exam   Vitals:   09/04/21 0000 09/04/21 0015  BP: 139/85 (!) 156/83  Pulse: 70 64  Resp: 18 18  Temp:    SpO2: 98% 99%    CONSTITUTIONAL:  coughing during exam, NAD NEURO:  Alert and oriented x 3, CN 3-12 grossly intact EYES:  eyes equal and reactive ENT/NECK:  Supple, no stridor, erythema of the oropharynx CARDIO:  normal rate, regular rhythm, appears well-perfused  PULM:  No respiratory distress, CTAB GI/GU:  non-distended, some right sided abdominal tenderness MSK/SPINE:  No gross deformities, no edema, moves all extremities  SKIN:  no rash, atraumatic   *Additional and/or pertinent findings included in MDM below  Diagnostic and Interventional Summary    EKG Interpretation  Date/Time:    Ventricular Rate:    PR Interval:    QRS Duration:   QT Interval:    QTC Calculation:   R Axis:     Text Interpretation:         Labs Reviewed  SARS CORONAVIRUS 2 BY RT PCR - Abnormal; Notable for the following components:      Result Value   SARS Coronavirus 2 by RT PCR POSITIVE (*)    All other components within normal limits  CBC WITH DIFFERENTIAL/PLATELET - Abnormal; Notable for the following components:   RBC 2.88 (*)    Hemoglobin 9.9 (*)    HCT 29.0 (*)    MCV 100.7 (*)    MCH 34.4 (*)     Lymphs Abs 0.6 (*)    All other components within normal limits  COMPREHENSIVE METABOLIC PANEL - Abnormal; Notable for the following components:   Potassium 3.3 (*)    Chloride 117 (*)    CO2 17 (*)    Creatinine, Ser 1.79 (*)    Calcium 7.6 (*)    Total Protein 6.1 (*)    Albumin 3.0 (*)    AST 14 (*)    Total Bilirubin 0.2 (*)    GFR, Estimated 42 (*)    All other components within normal limits  CULTURE, BLOOD (ROUTINE X 2)  CULTURE, BLOOD (ROUTINE X 2)  URINALYSIS, ROUTINE W REFLEX MICROSCOPIC  LACTIC ACID, PLASMA    DG Chest 2 View  Final Result      Medications  acetaminophen (TYLENOL) tablet 1,000 mg (1,000 mg Oral Given 09/03/21 2241)  sodium chloride 0.9 % bolus 500 mL (0 mLs Intravenous Stopped 09/03/21 2335)     Procedures  /  Critical Care Procedures  ED Course and Medical Decision Making  I have reviewed the triage vital signs, the nursing notes, and pertinent available records from the EMR.  Social Determinants Affecting Complexity of Care: Patient has no clinically significant social determinants affecting this chief complaint.Marland Kitchen  ED Course:   Patient here with cough, fever and bodyaches.  Top differential diagnoses include covid, flu, URI, pneumonia. Medical Decision Making Patient here with cough, fever, and myalgias that started yesterday.  He has history of HIV, but is compliant with his antiviral and last CD4 count was in the 900s, most recent viral load was undetectable, he has no meningismus, based on labs and appearance should be immunocompetent.  Laboratory work-up as below.  COVID test positive, will treat with molnupiravir and Tessalon Perles.  He is not hypoxic and has no respiratory distress, I do not think he requires admission to the hospital.  Problems Addressed: COVID-19: acute illness or injury  Amount and/or Complexity of Data Reviewed Labs: ordered.    Details: No significant leukocytosis, mildly elevated creatinine at 1.79, but  this is better than most recent labs, lactic acid is normal, COVID test positive Radiology: ordered and independent interpretation performed.    Details: Chest x-ray negative for large opacity  Risk OTC drugs. Prescription drug management. Decision regarding hospitalization.     Consultants: No consultations were needed in caring for this patient.   Treatment and Plan: I considered admission due to patient's initial presentation, but after considering the examination and diagnostic results, patient will not require admission and can be discharged with outpatient follow-up.  Patient seen by and discussed with Dr. Wyvonnia Dusky.  Final Clinical Impressions(s) / ED Diagnoses     ICD-10-CM   1. COVID-19  U07.1       ED Discharge Orders          Ordered    molnupiravir EUA (LAGEVRIO) 200 mg CAPS capsule  2 times daily        09/04/21 0022    benzonatate (TESSALON) 100 MG capsule  2 times daily PRN        09/04/21 0022              Discharge Instructions Discussed with and Provided to Patient:     Discharge Instructions      You need to stay home for the next 5 days.  Take medications as prescribed.  If your symptoms change or worsen, return to the ER.       Montine Circle, PA-C 09/04/21 0024    Montine Circle, PA-C 09/04/21 Dorthea Cove, MD 09/04/21 [phone]

## 2021-09-04 LAB — URINALYSIS, ROUTINE W REFLEX MICROSCOPIC
Bilirubin Urine: NEGATIVE
Glucose, UA: NEGATIVE mg/dL
Hgb urine dipstick: NEGATIVE
Ketones, ur: NEGATIVE mg/dL
Leukocytes,Ua: NEGATIVE
Nitrite: NEGATIVE
Protein, ur: NEGATIVE mg/dL
Specific Gravity, Urine: 1.01 (ref 1.005–1.030)
pH: 5 (ref 5.0–8.0)

## 2021-09-04 LAB — SARS CORONAVIRUS 2 BY RT PCR: SARS Coronavirus 2 by RT PCR: POSITIVE — AB

## 2021-09-04 MED ORDER — MOLNUPIRAVIR EUA 200MG CAPSULE: 4.0000 | ORAL_CAPSULE | Freq: Two times a day (BID) | ORAL | 0 refills | Status: AC

## 2021-09-04 MED ORDER — BENZONATATE 100 MG PO CAPS: 100.0000 mg | ORAL_CAPSULE | Freq: Two times a day (BID) | ORAL | 0 refills | Status: DC | PRN

## 2021-09-04 NOTE — Discharge Instructions (Addendum)
You need to stay home for the next 5 days.  Take medications as prescribed.  If your symptoms change or worsen, return to the ER.

## 2021-09-04 NOTE — ED Notes (Signed)
Pt A&OX4 ambulatory at d/c with independent steady gait, NAD. Pt verbalized understanding of d/c instructions, prescriptions and follow up care.

## 2021-09-04 NOTE — ED Notes (Signed)
Kevin [name], [name] informed the patient is covid positive

## 2021-09-08 ENCOUNTER — Other Ambulatory Visit (HOSPITAL_COMMUNITY): Payer: Self-pay

## 2021-09-09 LAB — CULTURE, BLOOD (ROUTINE X 2)
Culture: NO GROWTH
Culture: NO GROWTH
Special Requests: ADEQUATE
Special Requests: ADEQUATE

## 2021-09-19 ENCOUNTER — Other Ambulatory Visit (HOSPITAL_COMMUNITY): Payer: Self-pay

## 2021-09-21 ENCOUNTER — Other Ambulatory Visit (HOSPITAL_COMMUNITY): Payer: Self-pay

## 2021-09-23 ENCOUNTER — Other Ambulatory Visit (HOSPITAL_COMMUNITY): Payer: Self-pay

## 2021-10-07 ENCOUNTER — Other Ambulatory Visit (HOSPITAL_COMMUNITY): Payer: Self-pay

## 2021-10-27 ENCOUNTER — Other Ambulatory Visit (HOSPITAL_COMMUNITY): Payer: Self-pay

## 2021-10-31 ENCOUNTER — Other Ambulatory Visit (HOSPITAL_COMMUNITY): Payer: Self-pay

## 2021-11-12 ENCOUNTER — Ambulatory Visit (HOSPITAL_COMMUNITY)
Admission: EM | Admit: 2021-11-12 | Discharge: 2021-11-12 | Disposition: A | Discharge status: Home / Self Care | Payer: Medicaid Other | Attending: Emergency Medicine | Admitting: Emergency Medicine

## 2021-11-12 ENCOUNTER — Encounter (HOSPITAL_COMMUNITY): Payer: Self-pay

## 2021-11-12 DIAGNOSIS — J441 Chronic obstructive pulmonary disease with (acute) exacerbation: Secondary | ICD-10-CM

## 2021-11-12 MED ORDER — ALBUTEROL SULFATE HFA 108 (90 BASE) MCG/ACT IN AERS
2.0000 | INHALATION_SPRAY | Freq: Four times a day (QID) | RESPIRATORY_TRACT | 0 refills | Status: DC | PRN
Start: 2021-11-12 — End: 2021-12-29

## 2021-11-12 MED ORDER — IPRATROPIUM-ALBUTEROL 0.5-2.5 (3) MG/3ML IN SOLN
RESPIRATORY_TRACT | Status: AC
  Filled 2021-11-12: qty 3

## 2021-11-12 MED ORDER — METHYLPREDNISOLONE SODIUM SUCC 125 MG IJ SOLR
INTRAMUSCULAR | Status: AC
  Filled 2021-11-12: qty 2

## 2021-11-12 MED ORDER — METHYLPREDNISOLONE SODIUM SUCC 125 MG IJ SOLR
60.0000 mg | Freq: Once | INTRAMUSCULAR | Status: AC
Start: 2021-11-12 — End: 2021-11-12
  Administered 2021-11-12: 60 mg via INTRAMUSCULAR

## 2021-11-12 MED ORDER — PREDNISONE 20 MG PO TABS: 40.0000 mg | ORAL_TABLET | Freq: Every day | ORAL | 0 refills | Status: DC

## 2021-11-12 MED ORDER — BENZONATATE 100 MG PO CAPS: 100.0000 mg | ORAL_CAPSULE | Freq: Two times a day (BID) | ORAL | 0 refills | Status: DC | PRN

## 2021-11-12 MED ORDER — IPRATROPIUM-ALBUTEROL 0.5-2.5 (3) MG/3ML IN SOLN: 3.0000 mL | Freq: Four times a day (QID) | RESPIRATORY_TRACT | 1 refills | Status: DC | PRN

## 2021-11-12 MED ORDER — IPRATROPIUM-ALBUTEROL 0.5-2.5 (3) MG/3ML IN SOLN
3.0000 mL | Freq: Once | RESPIRATORY_TRACT | Status: AC
  Administered 2021-11-12: 3 mL via RESPIRATORY_TRACT

## 2021-11-12 NOTE — ED Provider Notes (Signed)
Kevin [name]    CSN: [id number] Arrival date & time: 11/12/21  1506      History   Chief Complaint Chief Complaint  Patient presents with   Shortness of Breath    HPI Kevin [name] is a 66 y.o. male.   Patient presents with a productive cough with Essynce Munsch sputum, shortness of breath at rest worsened by exertion and intermittent wheezing for 4 to 5 days.  Initially had an accompanying sore throat but this has resolved.  Has attempted use of albuterol nebulizers but has run out of medication.  History of COPD CHF, hypertension, sleep apnea, A-fib.  Denies fever, chills, body aches, ear pain, congestion.    Past Medical History:  Diagnosis Date   AIDS (acquired immune deficiency syndrome) ([place]) 08/17/2016   Chronic diastolic CHF (congestive heart failure), NYHA class 3 (HCC) 01/2016   Chronic lower back pain    CKD (chronic kidney disease), stage III (HCC)    COPD (chronic obstructive pulmonary disease) ([place])    Gout    "forearms, hands, ankles, feet" (06/05/2016)   Headache    "weekly" (06/05/2016)   Heart murmur    Hypertension    Hypertensive crisis 08/15/2017   OSA on CPAP    PAD (peripheral artery disease) (HCC)    PAF (paroxysmal atrial fibrillation) ([place]) 01/2016   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    Patient Active Problem List   Diagnosis Date Noted   Acute diastolic heart failure ([place]) 07/14/2021   Right renal mass 06/29/2021   Renal lesion 06/29/2021   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   Hypertensive crisis    Acute respiratory failure with hypoxia ([place])    Dyspnea 04/04/2019   Callus of foot 07/10/2018   Hypertensive urgency 08/15/2017   Coronary artery disease involving native coronary artery of native heart with unstable angina pectoris ([place]) 10/19/2016   Easy bruising 08/11/2016   Claudication in peripheral vascular disease ([place]) 06/05/2016   Stage 3 chronic kidney disease ([place])    Peripheral arterial disease ([place])  05/09/2016   Acute on chronic diastolic CHF (congestive heart failure), NYHA class 3 ([place]) 03/24/2016   OSA (obstructive sleep apnea) 03/24/2016   Essential hypertension 02/18/2016   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   Shortness of breath 02/07/2016   PAF (paroxysmal atrial fibrillation) ([place]) 02/07/2016   Tobacco abuse 02/07/2016   Acute on chronic renal insufficiency 02/07/2016   COPD exacerbation ([place]) 02/07/2016   Hypertensive emergency 02/07/2016   HIV disease ([place]) 08/27/2004    Past Surgical History:  Procedure Laterality Date   IR RADIOLOGIST EVAL & MGMT  05/31/2021   IR RADIOLOGIST EVAL & MGMT  07/26/2021   LEFT HEART CATH AND CORONARY ANGIOGRAPHY N/A 10/23/2016   Procedure: LEFT HEART CATH AND CORONARY ANGIOGRAPHY;  Surgeon: Leonie Man, MD;  Location: [facility] [facility];  Service: Cardiovascular;  Laterality: N/A;   LOWER EXTREMITY ANGIOGRAPHY N/A 07/17/2016   Procedure: Lower Extremity Angiography;  Surgeon: Lorretta Harp, MD;  Location: [facility];  Service: Cardiovascular;  Laterality: N/A;   LOWER EXTREMITY INTERVENTION N/A 06/05/2016   Procedure: Lower Extremity Intervention;  Surgeon: Lorretta Harp, MD;  Location: [facility];  Service: Cardiovascular;  Laterality: N/A;   PERIPHERAL VASCULAR ATHERECTOMY Right 07/17/2016   Procedure: Peripheral Vascular Atherectomy;  Surgeon: Lorretta Harp, MD;  Location: [facility];  Service: Cardiovascular;  Laterality: Right;  SFA   PERIPHERAL VASCULAR INTERVENTION  06/05/2016  Procedure: Peripheral Vascular Intervention;  Surgeon: Lorretta Harp, MD;  Location: [facility];  Service: Cardiovascular;;  left SFA   RADIOLOGY WITH ANESTHESIA Right 06/29/2021   Procedure: RIGHT RENAL CRYOABLATION;  Surgeon: Markus Daft, MD;  Location: [facility];  Service: Anesthesiology;  Laterality: Right;       Home Medications    Prior to Admission medications   Medication Sig Start Date End Date  Taking? Authorizing Provider  aspirin EC 81 MG tablet Take 1 tablet (81 mg total) by mouth daily. Swallow whole. 07/17/21  Yes Charlynne Cousins, MD  carvedilol (COREG) 3.125 MG tablet Take 1 tablet (3.125 mg total) by mouth 2 (two) times daily. 07/17/21 07/17/22 Yes Charlynne Cousins, MD  dolutegravir-lamiVUDine (DOVATO) 50-300 MG tablet Take 1 tablet by mouth daily. 08/08/21  Yes [name], [name], MD  isosorbide-hydrALAZINE (BIDIL) 20-37.5 MG tablet Take 1 tablet by mouth 3 (three) times daily. 08/08/21  Yes Truman Hayward, MD  Multiple Vitamin (MULTIVITAMIN) tablet Take 1 tablet by mouth daily.   Yes [provider]  valsartan-hydrochlorothiazide (DIOVAN-HCT) 80-12.5 MG tablet Take 1 tablet by mouth daily. 07/17/21  Yes Charlynne Cousins, MD  albuterol (VENTOLIN HFA) 108 (90 Base) MCG/ACT inhaler Inhale 2 puffs into the lungs every 6 (six) hours as needed for wheezing or shortness of breath. Patient taking differently: Inhale 2 puffs into the lungs 2 (two) times daily as needed for wheezing or shortness of breath. 04/19/21   Raspet, Derry Skill, PA-C  atorvastatin (LIPITOR) 40 MG tablet Take 1 tablet (40 mg total) by mouth daily. 07/17/21   Charlynne Cousins, MD  benzonatate (TESSALON) 100 MG capsule Take 1 capsule (100 mg total) by mouth 2 (two) times daily as needed for cough. 09/04/21   Montine Circle, PA-C  DULERA 100-5 MCG/ACT AERO SMARTSIG:2 Puff(s) Via Inhaler Every Morning 07/27/21   [provider]  fluticasone furoate-vilanterol (BREO ELLIPTA) 200-25 MCG/ACT AEPB Inhale 1 puff into the lungs daily. 07/18/21   Charlynne Cousins, MD    Family History Family History  Problem Relation Age of Onset   High blood pressure Mother    Lupus Mother     Social History Social History   Tobacco Use   Smoking status: Every Day    Packs/day: 0.10    Years: 42.00    Total pack years: 4.20    Types: Cigarettes, E-cigarettes   Smokeless tobacco: Never   Tobacco  comments:    1-2 cigarettes a day; vaping  Vaping Use   Vaping Use: Every day   Substances: Nicotine, Flavoring  Substance Use Topics   Alcohol use: Not Currently    Alcohol/week: 2.0 standard drinks of alcohol    Types: 2 Cans of beer per week    Comment: rare   Drug use: Not Currently    Comment: rare     Allergies   Patient has no known allergies.   Review of Systems Review of Systems  Constitutional: Negative.   HENT: Negative.    Respiratory:  Positive for cough, shortness of breath and wheezing. Negative for apnea, choking, chest tightness and stridor.   Cardiovascular: Negative.   Skin: Negative.   Neurological: Negative.      Physical Exam Triage Vital Signs ED Triage Vitals  Enc Vitals Group     BP 11/12/21 1616 (!) 217/121     Pulse Rate 11/12/21 1616 (!) 59     Resp 11/12/21 1616 16     Temp  11/12/21 1616 98.2 F (36.8 C)     Temp Source 11/12/21 1616 Oral     SpO2 11/12/21 1616 94 %     Weight --      Height --      Head Circumference --      Peak Flow --      Pain Score 11/12/21 1619 0     Pain Loc --      Pain Edu? --      Excl. in [place]? --    No data found.  Updated Vital Signs BP (!) 217/121 (BP Location: Right Arm)   Pulse (!) 59   Temp 98.2 F (36.8 C) (Oral)   Resp 16   SpO2 94%   Visual Acuity Right Eye Distance:   Left Eye Distance:   Bilateral Distance:    Right Eye Near:   Left Eye Near:    Bilateral Near:     Physical Exam Constitutional:      Appearance: Normal appearance. He is well-developed.  HENT:     Head: Normocephalic.  Eyes:     Extraocular Movements: Extraocular movements intact.  Cardiovascular:     Rate and Rhythm: Normal rate and regular rhythm.     Pulses: Normal pulses.     Heart sounds: Normal heart sounds.  Pulmonary:     Effort: Pulmonary effort is normal.     Breath sounds: Wheezing present.  Skin:    General: Skin is warm and dry.  Neurological:     Mental Status: He is alert and oriented  to person, place, and time. Mental status is at baseline.  Psychiatric:        Mood and Affect: Mood normal.        Behavior: Behavior normal.      UC Treatments / Results  Labs (all labs ordered are listed, but only abnormal results are displayed) Labs Reviewed - No data to display  EKG   Radiology No results found.  Procedures Procedures (including critical care time)  Medications Ordered in UC Medications - No data to display  Initial Impression / Assessment and Plan / UC Course  I have reviewed the triage vital signs and the nursing notes.  Pertinent labs & imaging results that were available during my care of the patient were reviewed by me and considered in my medical decision making (see chart for details).  COPD exacerbation  O2 saturations stable at 94% on room air, wheezing is heard to auscultation on initial assessment, given DuoNeb and methylprednisolone injection in office, on reevaluation patient endorses that he is feeling better and wheezing has subsided, prescribed prednisone, Tessalon, albuterol inhaler and DuoNebs for home use, given walking referral to pulmonology for further evaluation, may follow-up with urgent care as needed Final Clinical Impressions(s) / UC Diagnoses   Final diagnoses:  None   Discharge Instructions   None    ED Prescriptions   None    PDMP not reviewed this encounter.   Hans Eden, [place] 11/13/21 [phone]

## 2021-11-12 NOTE — Discharge Instructions (Addendum)
Today you are being treated for inflammation to your upper airways due to to your COPD  Starting tomorrow begin use of prednisone every morning with food for 5 days to help reduce inflammation   May use albuterol inhaler taking 2 puffs every 4 hours as needed to help calm shortness of breath and wheezing  May use Tessalon pill every 8 hours to help calm coughing, may use over-the-counter Delsym in addition to this  You have been given information to pulmonology, please reach out and schedule an appointment, ask office if they have resources to help you find a ride  For worsening signs of breathing please go to the nearest urgent care and emergency department for further evaluation  In addition:  Maintaining adequate hydration may help to thin secretions and soothe the respiratory mucosa   Warm Liquids- Ingestion of warm liquids may have a soothing effect on the respiratory mucosa, increase the flow of nasal mucus, and loosen respiratory secretions, making them easier to remove  May try honey (2.5 to 5 mL [0.5 to 1 teaspoon]) can be given straight or diluted in liquid (juice). Corn syrup may be substituted if honey is not available.

## 2021-11-12 NOTE — ED Triage Notes (Signed)
Patient having SOB, wheezing, and history of COP. Onset 4 days. Patient did a breathing treatment at 4 am and this helped a little. States has been doing these 3 a day.   Patient had COVID last month and was at [facility].

## 2021-11-16 ENCOUNTER — Other Ambulatory Visit (HOSPITAL_COMMUNITY): Payer: Self-pay

## 2021-11-17 ENCOUNTER — Other Ambulatory Visit (HOSPITAL_COMMUNITY): Payer: Self-pay

## 2021-11-18 ENCOUNTER — Other Ambulatory Visit (HOSPITAL_COMMUNITY): Payer: Self-pay

## 2021-11-22 ENCOUNTER — Other Ambulatory Visit (HOSPITAL_COMMUNITY): Payer: Self-pay

## 2021-12-07 ENCOUNTER — Encounter: Payer: Self-pay | Admitting: Infectious Disease

## 2021-12-07 ENCOUNTER — Other Ambulatory Visit: Payer: Self-pay

## 2021-12-07 ENCOUNTER — Other Ambulatory Visit (HOSPITAL_COMMUNITY)
Admission: RE | Admit: 2021-12-07 | Discharge: 2021-12-07 | Disposition: A | Discharge status: Home / Self Care | Payer: Medicaid Other | Source: Ambulatory Visit | Attending: Infectious Disease | Admitting: Infectious Disease

## 2021-12-07 ENCOUNTER — Ambulatory Visit (INDEPENDENT_AMBULATORY_CARE_PROVIDER_SITE_OTHER): Payer: Medicaid Other | Admitting: Infectious Disease

## 2021-12-07 VITALS — BP 190/121 | HR 64 | Temp 97.8°F | Resp 16 | Ht 72.0 in | Wt 159.0 lb

## 2021-12-07 DIAGNOSIS — I169 Hypertensive crisis, unspecified: Secondary | ICD-10-CM | POA: Diagnosis not present

## 2021-12-07 DIAGNOSIS — B20 Human immunodeficiency virus [HIV] disease: Secondary | ICD-10-CM | POA: Insufficient documentation

## 2021-12-07 DIAGNOSIS — Z720 Tobacco use: Secondary | ICD-10-CM | POA: Diagnosis not present

## 2021-12-07 DIAGNOSIS — I739 Peripheral vascular disease, unspecified: Secondary | ICD-10-CM | POA: Diagnosis not present

## 2021-12-07 DIAGNOSIS — C649 Malignant neoplasm of unspecified kidney, except renal pelvis: Secondary | ICD-10-CM

## 2021-12-07 DIAGNOSIS — I25110 Atherosclerotic heart disease of native coronary artery with unstable angina pectoris: Secondary | ICD-10-CM

## 2021-12-07 DIAGNOSIS — N1832 Chronic kidney disease, stage 3b: Secondary | ICD-10-CM

## 2021-12-07 NOTE — Progress Notes (Signed)
Subjective:  Chief complaint:   Patient ID: Kevin [name], male    DOB: 23-Mar-1955, 66 y.o.   MRN: [id number]  HPI  Kevin [name] is a 66 year old black man who has HIV infection that has been well controlled on Biktarvy, with comorbid chronic diastolic CHF chronic kidney disease COPD peripheral artery disease and paroxysmal atrial fibrillation who was recently diagnosed by MRIs having a likely papillary renal cell carcinoma.  He was scheduled for cryoablation of this kidney.  In anticipation of him losing the kidney we decided to get an offer alafenamide of his regimen and switch him over to Kevin [name].   Been highly adherent to this medication and viral load is remained suppressed.   Her is super hypertensive again today in the 190s over 120s.  He has not taken BP medication today and has not seen Dr. Criss [name]    He tells me has been concerned about a growth on his face that has been there for many months now.    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    Past Surgical History:  Procedure Laterality Date   IR RADIOLOGIST EVAL & MGMT  05/31/2021   IR RADIOLOGIST EVAL & MGMT  07/26/2021   LEFT HEART CATH AND CORONARY ANGIOGRAPHY N/A 10/23/2016   Procedure: LEFT HEART CATH AND CORONARY ANGIOGRAPHY;  Surgeon: Leonie Man, MD;  Location: [facility];  Service: Cardiovascular;  Laterality: N/A;   LOWER  EXTREMITY ANGIOGRAPHY N/A 07/17/2016   Procedure: Lower Extremity Angiography;  Surgeon: Lorretta Harp, MD;  Location: [facility];  Service: Cardiovascular;  Laterality: N/A;   LOWER EXTREMITY INTERVENTION N/A 06/05/2016   Procedure: Lower Extremity Intervention;  Surgeon: Lorretta Harp, MD;  Location: [facility];  Service: Cardiovascular;  Laterality: N/A;   PERIPHERAL VASCULAR ATHERECTOMY Right 07/17/2016   Procedure: Peripheral Vascular Atherectomy;  Surgeon: Lorretta Harp, MD;  Location: [facility];  Service: Cardiovascular;  Laterality: Right;  SFA   PERIPHERAL VASCULAR INTERVENTION  06/05/2016   Procedure: Peripheral Vascular Intervention;  Surgeon: Lorretta Harp, MD;  Location: [facility];  Service: Cardiovascular;;  left SFA   RADIOLOGY WITH ANESTHESIA Right 06/29/2021   Procedure: RIGHT RENAL CRYOABLATION;  Surgeon: Markus Daft, MD;  Location: [facility];  Service: Anesthesiology;  Laterality: Right;    Family History  Problem Relation Age of Onset   High blood pressure Mother    Lupus Mother       Social History   Socioeconomic History   Marital status: Significant Other    Spouse name: Not on file   Number of children: Not on file   Years of education: Not on file   Highest education level: Not on file  Occupational History   Not on file  Tobacco Use   Smoking status: Every Day  Packs/day: 0.10    Years: 42.00    Total pack years: 4.20    Types: Cigarettes, E-cigarettes   Smokeless tobacco: Never   Tobacco comments:    1-2 cigarettes a day; vaping  Vaping Use   Vaping Use: Every day   Substances: Nicotine, Flavoring  Substance and Sexual Activity   Alcohol use: Not Currently    Alcohol/week: 2.0 standard drinks of alcohol    Types: 2 Cans of beer per week    Comment: rare   Drug use: Not Currently    Comment: rare   Sexual activity: Not Currently    Comment: declined condoms  Other Topics Concern   Not on file  Social  History Narrative   Not on file   Social Determinants of Health   Financial Resource Strain: Not on file  Food Insecurity: Not on file  Transportation Needs: Not on file  Physical Activity: Not on file  Stress: Not on file  Social Connections: Not on file    No Known Allergies   Current Outpatient Medications:    albuterol (VENTOLIN HFA) 108 (90 Base) MCG/ACT inhaler, Inhale 2 puffs into the lungs every 6 (six) hours as needed for wheezing or shortness of breath., Disp: 18 g, Rfl: 0   aspirin EC 81 MG tablet, Take 1 tablet (81 mg total) by mouth daily. Swallow whole., Disp: 30 tablet, Rfl: 12   atorvastatin (LIPITOR) 40 MG tablet, Take 1 tablet (40 mg total) by mouth daily., Disp: 60 tablet, Rfl: 3   benzonatate (TESSALON) 100 MG capsule, Take 1 capsule (100 mg total) by mouth 2 (two) times daily as needed for cough., Disp: 20 capsule, Rfl: 0   carvedilol (COREG) 3.125 MG tablet, Take 1 tablet (3.125 mg total) by mouth 2 (two) times daily., Disp: 60 tablet, Rfl: 11   dolutegravir-lamiVUDine (DOVATO) 50-300 MG tablet, Take 1 tablet by mouth daily., Disp: 30 tablet, Rfl: 11   DULERA 100-5 MCG/ACT AERO, SMARTSIG:2 Puff(s) Via Inhaler Every Morning, Disp: , Rfl:    fluticasone furoate-vilanterol (BREO ELLIPTA) 200-25 MCG/ACT AEPB, Inhale 1 puff into the lungs daily., Disp: 30 each, Rfl: 3   ipratropium-albuterol (DUONEB) 0.5-2.5 (3) MG/3ML SOLN, Take 3 mLs by nebulization every 6 (six) hours as needed., Disp: 360 mL, Rfl: 1   isosorbide-hydrALAZINE (BIDIL) 20-37.5 MG tablet, Take 1 tablet by mouth 3 (three) times daily., Disp: 90 tablet, Rfl: 11   Multiple Vitamin (MULTIVITAMIN) tablet, Take 1 tablet by mouth daily., Disp: , Rfl:    predniSONE (DELTASONE) 20 MG tablet, Take 2 tablets (40 mg total) by mouth daily., Disp: 10 tablet, Rfl: 0   valsartan-hydrochlorothiazide (DIOVAN-HCT) 80-12.5 MG tablet, Take 1 tablet by mouth daily., Disp: 30 tablet, Rfl: 3    Review of Systems   Constitutional:  Negative for activity change, appetite change, chills, diaphoresis, fatigue, fever and unexpected weight change.  HENT:  Negative for congestion, rhinorrhea, sinus pressure, sneezing, sore throat and trouble swallowing.   Eyes:  Negative for photophobia and visual disturbance.  Respiratory:  Negative for cough, chest tightness, shortness of breath, wheezing and stridor.   Cardiovascular:  Negative for chest pain, palpitations and leg swelling.  Gastrointestinal:  Negative for abdominal distention, abdominal pain, anal bleeding, blood in stool, constipation, diarrhea, nausea and vomiting.  Genitourinary:  Negative for difficulty urinating, dysuria, flank pain and hematuria.  Musculoskeletal:  Negative for arthralgias, back pain, gait problem, joint swelling and myalgias.  Skin:  Negative for color change, pallor, rash and wound.  Neurological:  Negative for dizziness, tremors, weakness and light-headedness.  Hematological:  Negative for adenopathy. Does not bruise/bleed easily.  Psychiatric/Behavioral:  Negative for agitation, behavioral problems, confusion, decreased concentration, dysphoric mood and sleep disturbance.        Objective:   Physical Exam Constitutional:      Appearance: He is well-developed.  HENT:     Head: Normocephalic and atraumatic.  Eyes:     Conjunctiva/sclera: Conjunctivae normal.  Cardiovascular:     Rate and Rhythm: Normal rate and regular rhythm.  Pulmonary:     Effort: Pulmonary effort is normal. No respiratory distress.     Breath sounds: No wheezing.  Abdominal:     General: There is no distension.     Palpations: Abdomen is soft.  Musculoskeletal:        General: No tenderness. Normal range of motion.     Cervical back: Normal range of motion and neck supple.  Skin:    General: Skin is warm and dry.     Coloration: Skin is not pale.     Findings: No erythema or rash.  Neurological:     General: No focal deficit present.      Mental Status: He is alert and oriented to person, place, and time.  Psychiatric:        Mood and Affect: Mood normal.        Behavior: Behavior normal.        Thought Content: Thought content normal.        Judgment: Judgment normal.      Facial area he is concerned about 12/07/2021:        Assessment & Plan:   HIV disease:  I will add order HIV viral load CD4 count CBC with differential CMP, RPR GC and chlamydia and I will continue  Kevin [name]'s  Dovato prescription   Hyperlipidemia we will continue his atorvastatin prescription    CKD and now sp resection of one kidney.  Will check CMP   Hypertension: severe I am referring him to another PCP since he says that he feels Dr. Criss [name] is not a good fit  Vitals:   12/07/21 1344  Resp: 16   Facial lesion: appears c/w lipoma

## 2021-12-08 LAB — URINE CYTOLOGY ANCILLARY ONLY
Chlamydia: NEGATIVE
Comment: NEGATIVE
Comment: NORMAL
Neisseria Gonorrhea: NEGATIVE

## 2021-12-08 LAB — T-HELPER CELLS (CD4) COUNT (NOT AT ARMC)
CD4 % Helper T Cell: 49 % (ref 33–65)
CD4 T Cell Abs: 1104 /uL (ref 400–1790)

## 2021-12-09 LAB — COMPLETE METABOLIC PANEL WITH GFR
AG Ratio: 1.2 (calc) (ref 1.0–2.5)
ALT: 8 U/L — ABNORMAL LOW (ref 9–46)
AST: 10 U/L (ref 10–35)
Albumin: 3.9 g/dL (ref 3.6–5.1)
Alkaline phosphatase (APISO): 74 U/L (ref 35–144)
BUN/Creatinine Ratio: 9 (calc) (ref 6–22)
BUN: 22 mg/dL (ref 7–25)
CO2: 26 mmol/L (ref 20–32)
Calcium: 9 mg/dL (ref 8.6–10.3)
Chloride: 109 mmol/L (ref 98–110)
Creat: 2.41 mg/dL — ABNORMAL HIGH (ref 0.70–1.35)
Globulin: 3.2 g/dL (calc) (ref 1.9–3.7)
Glucose, Bld: 81 mg/dL (ref 65–99)
Potassium: 4.2 mmol/L (ref 3.5–5.3)
Sodium: 141 mmol/L (ref 135–146)
Total Bilirubin: 0.5 mg/dL (ref 0.2–1.2)
Total Protein: 7.1 g/dL (ref 6.1–8.1)
eGFR: 29 mL/min/{1.73_m2} — ABNORMAL LOW (ref >=60)

## 2021-12-09 LAB — CBC WITH DIFFERENTIAL/PLATELET
Absolute Monocytes: 681 cells/uL (ref 200–950)
Basophils Absolute: 46 cells/uL (ref 0–200)
Basophils Relative: 0.5 %
Eosinophils Absolute: 488 cells/uL (ref 15–500)
Eosinophils Relative: 5.3 %
HCT: 37.5 % — ABNORMAL LOW (ref 38.5–50.0)
Hemoglobin: 12.8 g/dL — ABNORMAL LOW (ref 13.2–17.1)
Lymphs Abs: 2337 cells/uL (ref 850–3900)
MCH: 32.7 pg (ref 27.0–33.0)
MCHC: 34.1 g/dL (ref 32.0–36.0)
MCV: 95.9 fL (ref 80.0–100.0)
MPV: 10.2 fL (ref 7.5–12.5)
Monocytes Relative: 7.4 %
Neutro Abs: 5649 cells/uL (ref 1500–7800)
Neutrophils Relative %: 61.4 %
Platelets: 340 10*3/uL (ref 140–400)
RBC: 3.91 10*6/uL — ABNORMAL LOW (ref 4.20–5.80)
RDW: 13.8 % (ref 11.0–15.0)
Total Lymphocyte: 25.4 %
WBC: 9.2 10*3/uL (ref 3.8–10.8)

## 2021-12-09 LAB — LIPID PANEL
Cholesterol: 220 mg/dL — ABNORMAL HIGH (ref <200)
HDL: 68 mg/dL (ref >=40)
LDL Cholesterol (Calc): 130 mg/dL (calc) — ABNORMAL HIGH
Non-HDL Cholesterol (Calc): 152 mg/dL (calc) — ABNORMAL HIGH (ref <130)
Total CHOL/HDL Ratio: 3.2 (calc) (ref <5.0)
Triglycerides: 117 mg/dL (ref <150)

## 2021-12-09 LAB — RPR: RPR Ser Ql: NONREACTIVE

## 2021-12-09 LAB — HIV-1 RNA QUANT-NO REFLEX-BLD
HIV 1 RNA Quant: NOT DETECTED Copies/mL
HIV-1 RNA Quant, Log: NOT DETECTED Log cps/mL

## 2021-12-16 ENCOUNTER — Other Ambulatory Visit (HOSPITAL_COMMUNITY): Payer: Self-pay

## 2021-12-23 ENCOUNTER — Other Ambulatory Visit (HOSPITAL_COMMUNITY): Payer: Self-pay

## 2021-12-26 ENCOUNTER — Ambulatory Visit (HOSPITAL_COMMUNITY)
Admission: EM | Admit: 2021-12-26 | Discharge: 2021-12-26 | Disposition: A | Discharge status: Home / Self Care | Payer: Medicaid Other

## 2021-12-26 ENCOUNTER — Encounter (HOSPITAL_COMMUNITY): Payer: Self-pay | Admitting: Emergency Medicine

## 2021-12-26 NOTE — ED Triage Notes (Signed)
Pt reports SOB been ongoing for a while. Pt reports out of inhaler that normal helps out SOB. SOB esp with bending over. Pt adds that can hear himself wheezing.  Reports neb treatments help but doesn't last very long.  Pt reports chest pain when breathing hard but gets very SOB.

## 2021-12-26 NOTE — ED Provider Notes (Signed)
Patient seen briefly in triage.  He is here with chest discomfort and shortness of breath.  He states he has used his nebulizer, last dose last night, and it helps for just a few minutes and then the symptoms return.  His blood pressure here is 203/130.  He states it often is 355 systolic.  On review of the chart his systolic is mostly 974- 85 range.  I have asked him to proceed to the emergency room for urgent evaluation.  We cannot assess him for cardiac work-up adequately in the urgent care setting.  Also if he is already been using nebulizers and they are not working sufficiently, most likely he will not have benefit from any therapy we have here in the urgent care.   [name] Henle, MD 12/26/21 [phone]

## 2021-12-26 NOTE — ED Notes (Signed)
Patient is being discharged from the Urgent Care and sent to the Emergency Department via POV. Per Dr Windy Carina, patient is in need of higher level of care due to Shortness of breath and chest pain. Patient is aware and verbalizes understanding of plan of care.  Vitals:   12/26/21 1446  BP: (!) 203/130  Pulse: 74  Resp: (!) 22  Temp: 98 F (36.7 C)  SpO2: 98%

## 2021-12-26 NOTE — ED Notes (Signed)
Dr Windy Carina at bedside informing patient that needs to go to ED.

## 2021-12-27 ENCOUNTER — Other Ambulatory Visit: Payer: Self-pay

## 2021-12-27 ENCOUNTER — Emergency Department (HOSPITAL_COMMUNITY): Payer: Medicaid Other

## 2021-12-27 ENCOUNTER — Inpatient Hospital Stay (HOSPITAL_COMMUNITY)
Admission: EM | Admit: 2021-12-27 | Discharge: 2021-12-29 | DRG: 305 | Disposition: A | Discharge status: Home / Self Care | Payer: Medicaid Other | Attending: Internal Medicine | Admitting: Internal Medicine

## 2021-12-27 ENCOUNTER — Observation Stay (HOSPITAL_COMMUNITY): Payer: Medicaid Other

## 2021-12-27 ENCOUNTER — Encounter (HOSPITAL_COMMUNITY): Payer: Self-pay

## 2021-12-27 DIAGNOSIS — Z1152 Encounter for screening for COVID-19: Secondary | ICD-10-CM

## 2021-12-27 DIAGNOSIS — G4733 Obstructive sleep apnea (adult) (pediatric): Secondary | ICD-10-CM | POA: Diagnosis present

## 2021-12-27 DIAGNOSIS — G8929 Other chronic pain: Secondary | ICD-10-CM | POA: Diagnosis present

## 2021-12-27 DIAGNOSIS — I130 Hypertensive heart and chronic kidney disease with heart failure and stage 1 through stage 4 chronic kidney disease, or unspecified chronic kidney disease: Secondary | ICD-10-CM | POA: Diagnosis present

## 2021-12-27 DIAGNOSIS — I480 Paroxysmal atrial fibrillation: Secondary | ICD-10-CM | POA: Diagnosis present

## 2021-12-27 DIAGNOSIS — F1490 Cocaine use, unspecified, uncomplicated: Secondary | ICD-10-CM | POA: Diagnosis present

## 2021-12-27 DIAGNOSIS — Z7982 Long term (current) use of aspirin: Secondary | ICD-10-CM

## 2021-12-27 DIAGNOSIS — R079 Chest pain, unspecified: Secondary | ICD-10-CM | POA: Diagnosis not present

## 2021-12-27 DIAGNOSIS — J441 Chronic obstructive pulmonary disease with (acute) exacerbation: Secondary | ICD-10-CM | POA: Diagnosis present

## 2021-12-27 DIAGNOSIS — I5032 Chronic diastolic (congestive) heart failure: Secondary | ICD-10-CM | POA: Diagnosis present

## 2021-12-27 DIAGNOSIS — R0602 Shortness of breath: Secondary | ICD-10-CM

## 2021-12-27 DIAGNOSIS — J439 Emphysema, unspecified: Secondary | ICD-10-CM | POA: Diagnosis present

## 2021-12-27 DIAGNOSIS — N1832 Chronic kidney disease, stage 3b: Secondary | ICD-10-CM | POA: Diagnosis present

## 2021-12-27 DIAGNOSIS — Z7952 Long term (current) use of systemic steroids: Secondary | ICD-10-CM

## 2021-12-27 DIAGNOSIS — J4489 Other specified chronic obstructive pulmonary disease: Secondary | ICD-10-CM | POA: Diagnosis present

## 2021-12-27 DIAGNOSIS — Z832 Family history of diseases of the blood and blood-forming organs and certain disorders involving the immune mechanism: Secondary | ICD-10-CM

## 2021-12-27 DIAGNOSIS — E785 Hyperlipidemia, unspecified: Secondary | ICD-10-CM | POA: Diagnosis present

## 2021-12-27 DIAGNOSIS — F17210 Nicotine dependence, cigarettes, uncomplicated: Secondary | ICD-10-CM | POA: Diagnosis present

## 2021-12-27 DIAGNOSIS — N183 Chronic kidney disease, stage 3 unspecified: Secondary | ICD-10-CM | POA: Diagnosis present

## 2021-12-27 DIAGNOSIS — I160 Hypertensive urgency: Secondary | ICD-10-CM | POA: Diagnosis not present

## 2021-12-27 DIAGNOSIS — R7989 Other specified abnormal findings of blood chemistry: Secondary | ICD-10-CM | POA: Insufficient documentation

## 2021-12-27 DIAGNOSIS — B20 Human immunodeficiency virus [HIV] disease: Secondary | ICD-10-CM | POA: Diagnosis present

## 2021-12-27 DIAGNOSIS — I161 Hypertensive emergency: Principal | ICD-10-CM | POA: Diagnosis present

## 2021-12-27 DIAGNOSIS — M545 Low back pain, unspecified: Secondary | ICD-10-CM | POA: Diagnosis present

## 2021-12-27 DIAGNOSIS — Z21 Asymptomatic human immunodeficiency virus [HIV] infection status: Secondary | ICD-10-CM | POA: Diagnosis present

## 2021-12-27 DIAGNOSIS — I739 Peripheral vascular disease, unspecified: Secondary | ICD-10-CM | POA: Diagnosis present

## 2021-12-27 DIAGNOSIS — Z85528 Personal history of other malignant neoplasm of kidney: Secondary | ICD-10-CM

## 2021-12-27 DIAGNOSIS — Z91199 Patient's noncompliance with other medical treatment and regimen due to unspecified reason: Secondary | ICD-10-CM

## 2021-12-27 LAB — CBC WITH DIFFERENTIAL/PLATELET
Abs Immature Granulocytes: 0.03 10*3/uL (ref 0.00–0.07)
Basophils Absolute: 0 10*3/uL (ref 0.0–0.1)
Basophils Relative: 0 %
Eosinophils Absolute: 0.1 10*3/uL (ref 0.0–0.5)
Eosinophils Relative: 1 %
HCT: 35.9 % — ABNORMAL LOW (ref 39.0–52.0)
Hemoglobin: 12.3 g/dL — ABNORMAL LOW (ref 13.0–17.0)
Immature Granulocytes: 0 %
Lymphocytes Relative: 4 %
Lymphs Abs: 0.4 10*3/uL — ABNORMAL LOW (ref 0.7–4.0)
MCH: 32 pg (ref 26.0–34.0)
MCHC: 34.3 g/dL (ref 30.0–36.0)
MCV: 93.5 fL (ref 80.0–100.0)
Monocytes Absolute: 0.1 10*3/uL (ref 0.1–1.0)
Monocytes Relative: 1 %
Neutro Abs: 9.6 10*3/uL — ABNORMAL HIGH (ref 1.7–7.7)
Neutrophils Relative %: 94 %
Platelets: 273 10*3/uL (ref 150–400)
RBC: 3.84 MIL/uL — ABNORMAL LOW (ref 4.22–5.81)
RDW: 15.1 % (ref 11.5–15.5)
WBC: 10.2 10*3/uL (ref 4.0–10.5)
nRBC: 0 % (ref 0.0–0.2)

## 2021-12-27 LAB — ECHOCARDIOGRAM COMPLETE
AR max vel: 4.16 cm2
AV Area VTI: 4.4 cm2
AV Area mean vel: 4.05 cm2
AV Mean grad: 4 mmHg
AV Peak grad: 10.5 mmHg
Ao pk vel: 1.62 m/s
Area-P 1/2: 2.54 cm2
Calc EF: 58.3 %
Height: 72 in
S' Lateral: 3.3 cm
Single Plane A2C EF: 59.8 %
Single Plane A4C EF: 55.9 %
Weight: 2479.73 oz

## 2021-12-27 LAB — COMPREHENSIVE METABOLIC PANEL
ALT: 10 U/L (ref 0–44)
AST: 16 U/L (ref 15–41)
Albumin: 3.8 g/dL (ref 3.5–5.0)
Alkaline Phosphatase: 63 U/L (ref 38–126)
Anion gap: 6 (ref 5–15)
BUN: 23 mg/dL (ref 8–23)
CO2: 21 mmol/L — ABNORMAL LOW (ref 22–32)
Calcium: 9.2 mg/dL (ref 8.9–10.3)
Chloride: 113 mmol/L — ABNORMAL HIGH (ref 98–111)
Creatinine, Ser: 1.45 mg/dL — ABNORMAL HIGH (ref 0.61–1.24)
GFR, Estimated: 54 mL/min — ABNORMAL LOW (ref >60)
Glucose, Bld: 119 mg/dL — ABNORMAL HIGH (ref 70–99)
Potassium: 4 mmol/L (ref 3.5–5.1)
Sodium: 140 mmol/L (ref 135–145)
Total Bilirubin: 0.7 mg/dL (ref 0.3–1.2)
Total Protein: 7.9 g/dL (ref 6.5–8.1)

## 2021-12-27 LAB — URINALYSIS, ROUTINE W REFLEX MICROSCOPIC
Bilirubin Urine: NEGATIVE
Glucose, UA: NEGATIVE mg/dL
Hgb urine dipstick: NEGATIVE
Ketones, ur: NEGATIVE mg/dL
Leukocytes,Ua: NEGATIVE
Nitrite: NEGATIVE
Protein, ur: 100 mg/dL — AB
Specific Gravity, Urine: 1.013 (ref 1.005–1.030)
pH: 5 (ref 5.0–8.0)

## 2021-12-27 LAB — SARS CORONAVIRUS 2 BY RT PCR: SARS Coronavirus 2 by RT PCR: NEGATIVE

## 2021-12-27 LAB — MRSA NEXT GEN BY PCR, NASAL: MRSA by PCR Next Gen: NOT DETECTED

## 2021-12-27 LAB — TROPONIN I (HIGH SENSITIVITY)
Troponin I (High Sensitivity): 74 ng/L — ABNORMAL HIGH (ref <18)
Troponin I (High Sensitivity): 74 ng/L — ABNORMAL HIGH (ref <18)
Troponin I (High Sensitivity): 72 ng/L — ABNORMAL HIGH (ref <18)
Troponin I (High Sensitivity): 70 ng/L — ABNORMAL HIGH (ref <18)

## 2021-12-27 LAB — BRAIN NATRIURETIC PEPTIDE: B Natriuretic Peptide: 83.3 pg/mL (ref 0.0–100.0)

## 2021-12-27 MED ORDER — DOLUTEGRAVIR-LAMIVUDINE 50-300 MG PO TABS: 1.0000 | ORAL_TABLET | Freq: Every day | ORAL | Status: DC

## 2021-12-27 MED ORDER — IPRATROPIUM-ALBUTEROL 0.5-2.5 (3) MG/3ML IN SOLN
3.0000 mL | Freq: Once | RESPIRATORY_TRACT | Status: AC
  Administered 2021-12-27: 3 mL via RESPIRATORY_TRACT
  Filled 2021-12-27: qty 3

## 2021-12-27 MED ORDER — ORAL CARE MOUTH RINSE: 15.0000 mL | OROMUCOSAL | Status: DC | PRN

## 2021-12-27 MED ORDER — ACETAMINOPHEN 325 MG PO TABS: 650.0000 mg | ORAL_TABLET | Freq: Four times a day (QID) | ORAL | Status: DC | PRN

## 2021-12-27 MED ORDER — ENOXAPARIN SODIUM 40 MG/0.4ML IJ SOSY
40.0000 mg | PREFILLED_SYRINGE | Freq: Every day | INTRAMUSCULAR | Status: DC
  Administered 2021-12-27 – 2021-12-28 (×2): 40 mg via SUBCUTANEOUS
  Filled 2021-12-27 (×2): qty 0.4

## 2021-12-27 MED ORDER — DILTIAZEM HCL-DEXTROSE 125-5 MG/125ML-% IV SOLN (PREMIX): 5.0000 mg/h | INTRAVENOUS | Status: DC

## 2021-12-27 MED ORDER — CARVEDILOL 3.125 MG PO TABS
3.1250 mg | ORAL_TABLET | Freq: Two times a day (BID) | ORAL | Status: DC
  Administered 2021-12-27: 3.125 mg via ORAL
  Filled 2021-12-27: qty 1

## 2021-12-27 MED ORDER — IRBESARTAN 75 MG PO TABS
75.0000 mg | ORAL_TABLET | Freq: Every day | ORAL | Status: DC
  Administered 2021-12-27 – 2021-12-28 (×2): 75 mg via ORAL
  Filled 2021-12-27 (×2): qty 1

## 2021-12-27 MED ORDER — CARVEDILOL 3.125 MG PO TABS: 3.1250 mg | ORAL_TABLET | Freq: Two times a day (BID) | ORAL | Status: DC

## 2021-12-27 MED ORDER — CARVEDILOL 3.125 MG PO TABS
3.1250 mg | ORAL_TABLET | Freq: Once | ORAL | Status: AC
  Administered 2021-12-27: 3.125 mg via ORAL
  Filled 2021-12-27: qty 1

## 2021-12-27 MED ORDER — HYDROCHLOROTHIAZIDE 12.5 MG PO TABS
12.5000 mg | ORAL_TABLET | Freq: Every day | ORAL | Status: DC
  Administered 2021-12-27: 12.5 mg via ORAL
  Filled 2021-12-27: qty 1

## 2021-12-27 MED ORDER — OXYCODONE HCL 5 MG PO TABS: 5.0000 mg | ORAL_TABLET | ORAL | Status: DC | PRN

## 2021-12-27 MED ORDER — LAMIVUDINE 150 MG PO TABS
300.0000 mg | ORAL_TABLET | Freq: Every day | ORAL | Status: DC
  Administered 2021-12-27 – 2021-12-29 (×3): 300 mg via ORAL
  Filled 2021-12-27 (×3): qty 2

## 2021-12-27 MED ORDER — DOLUTEGRAVIR SODIUM 50 MG PO TABS
50.0000 mg | ORAL_TABLET | Freq: Every day | ORAL | Status: DC
  Administered 2021-12-27 – 2021-12-29 (×3): 50 mg via ORAL
  Filled 2021-12-27 (×3): qty 1

## 2021-12-27 MED ORDER — METHYLPREDNISOLONE SODIUM SUCC 40 MG IJ SOLR
40.0000 mg | Freq: Two times a day (BID) | INTRAMUSCULAR | Status: AC
  Administered 2021-12-27 – 2021-12-28 (×2): 40 mg via INTRAVENOUS
  Filled 2021-12-27 (×2): qty 1

## 2021-12-27 MED ORDER — ACETAMINOPHEN 650 MG RE SUPP: 650.0000 mg | Freq: Four times a day (QID) | RECTAL | Status: DC | PRN

## 2021-12-27 MED ORDER — IPRATROPIUM-ALBUTEROL 0.5-2.5 (3) MG/3ML IN SOLN
3.0000 mL | Freq: Four times a day (QID) | RESPIRATORY_TRACT | Status: DC
  Administered 2021-12-27 (×2): 3 mL via RESPIRATORY_TRACT
  Filled 2021-12-27 (×2): qty 3

## 2021-12-27 MED ORDER — PREDNISONE 20 MG PO TABS
60.0000 mg | ORAL_TABLET | Freq: Once | ORAL | Status: AC
  Administered 2021-12-27: 60 mg via ORAL
  Filled 2021-12-27: qty 3

## 2021-12-27 MED ORDER — ASPIRIN 81 MG PO TBEC
81.0000 mg | DELAYED_RELEASE_TABLET | Freq: Every day | ORAL | Status: DC
  Administered 2021-12-28 – 2021-12-29 (×2): 81 mg via ORAL
  Filled 2021-12-27 (×2): qty 1

## 2021-12-27 MED ORDER — HYDRALAZINE HCL 20 MG/ML IJ SOLN
5.0000 mg | Freq: Once | INTRAMUSCULAR | Status: AC
  Administered 2021-12-27: 5 mg via INTRAVENOUS
  Filled 2021-12-27: qty 1

## 2021-12-27 MED ORDER — MELATONIN 5 MG PO TABS
5.0000 mg | ORAL_TABLET | Freq: Once | ORAL | Status: AC
  Administered 2021-12-27: 5 mg via ORAL
  Filled 2021-12-27: qty 1

## 2021-12-27 MED ORDER — ONDANSETRON HCL 4 MG/2ML IJ SOLN: 4.0000 mg | Freq: Four times a day (QID) | INTRAMUSCULAR | Status: DC | PRN

## 2021-12-27 MED ORDER — ONDANSETRON HCL 4 MG PO TABS: 4.0000 mg | ORAL_TABLET | Freq: Four times a day (QID) | ORAL | Status: DC | PRN

## 2021-12-27 MED ORDER — IPRATROPIUM-ALBUTEROL 0.5-2.5 (3) MG/3ML IN SOLN
3.0000 mL | Freq: Three times a day (TID) | RESPIRATORY_TRACT | Status: DC
  Administered 2021-12-28 – 2021-12-29 (×4): 3 mL via RESPIRATORY_TRACT
  Filled 2021-12-27 (×4): qty 3

## 2021-12-27 MED ORDER — NICARDIPINE HCL IN NACL 20-0.86 MG/200ML-% IV SOLN
3.0000 mg/h | INTRAVENOUS | Status: DC
  Administered 2021-12-27: 12.5 mg/h via INTRAVENOUS
  Administered 2021-12-27: 7.5 mg/h via INTRAVENOUS
  Administered 2021-12-27: 10 mg/h via INTRAVENOUS
  Administered 2021-12-27: 5 mg/h via INTRAVENOUS
  Administered 2021-12-27: 12.5 mg/h via INTRAVENOUS
  Administered 2021-12-27: 10 mg/h via INTRAVENOUS
  Administered 2021-12-28: 5 mg/h via INTRAVENOUS
  Administered 2021-12-28: 3 mg/h via INTRAVENOUS
  Filled 2021-12-27 (×16): qty 200

## 2021-12-27 MED ORDER — ADULT MULTIVITAMIN W/MINERALS CH
1.0000 | ORAL_TABLET | Freq: Every day | ORAL | Status: DC
  Administered 2021-12-27 – 2021-12-29 (×3): 1 via ORAL
  Filled 2021-12-27 (×3): qty 1

## 2021-12-27 MED ORDER — ALBUTEROL SULFATE (2.5 MG/3ML) 0.083% IN NEBU: 2.5000 mg | INHALATION_SOLUTION | RESPIRATORY_TRACT | Status: DC | PRN

## 2021-12-27 MED ORDER — VALSARTAN-HYDROCHLOROTHIAZIDE 80-12.5 MG PO TABS: 1.0000 | ORAL_TABLET | Freq: Every day | ORAL | Status: DC

## 2021-12-27 MED ORDER — CHLORHEXIDINE GLUCONATE CLOTH 2 % EX PADS
6.0000 | MEDICATED_PAD | Freq: Every day | CUTANEOUS | Status: DC
  Administered 2021-12-27: 6 via TOPICAL

## 2021-12-27 MED ORDER — PREDNISONE 20 MG PO TABS: 40.0000 mg | ORAL_TABLET | Freq: Every day | ORAL | Status: DC

## 2021-12-27 MED ORDER — ATORVASTATIN CALCIUM 40 MG PO TABS
40.0000 mg | ORAL_TABLET | Freq: Every day | ORAL | Status: DC
  Administered 2021-12-27 – 2021-12-29 (×3): 40 mg via ORAL
  Filled 2021-12-27 (×3): qty 1

## 2021-12-27 NOTE — ED Notes (Signed)
EKG was done around 0300 12/27/21

## 2021-12-27 NOTE — ED Triage Notes (Signed)
Complaining of shortness of breath and chest tightness that started today. Has been out of his albuterol and blood pressure medicine for over 24 hours. Is breathing better since getting his duo neb on the ambulance. They also gave baby asa 324 and solumedrol 125.

## 2021-12-27 NOTE — ED Provider Notes (Signed)
[facility] DEPT Provider Note   CSN: [id number] Arrival date & time: 12/27/21  0307     History  Chief Complaint  Patient presents with   Shortness of Breath    Kevin [name] is a 66 y.o. male.  66 year old male with history of COPD, HTN, CHF, PAF, PAD, CKD, AIDS, presents with complaint of COPD exacerbation.  Patient has been using his inhaler without significant relief, went to urgent care, was found to be hypertensive with complaint of chest tightness and was sent to the ER.  Patient was given a DuoNeb by ambulance in route as well as aspirin and Solu-Medrol '125mg'$ .  Per EMS notes, patient also reported being out of his blood pressure medication for over 24 hours.  When I questioned patient regarding this specifically, he states that he has been compliant with all of his medications and has not been out of any of his medications.  He had not taken his morning meds due to having been in the ER all night.  Denies fevers, chills.  Cough is nonproductive.       Home Medications Prior to Admission medications   Medication Sig Start Date End Date Taking? Authorizing Provider  albuterol (VENTOLIN HFA) 108 (90 Base) MCG/ACT inhaler Inhale 2 puffs into the lungs every 6 (six) hours as needed for wheezing or shortness of breath. 11/12/21  Yes White, Adrienne R, NP  carvedilol (COREG) 3.125 MG tablet Take 1 tablet (3.125 mg total) by mouth 2 (two) times daily. Patient taking differently: Take 3.125 mg by mouth at bedtime. 07/17/21 07/17/22 Yes Charlynne Cousins, MD  DULERA 100-5 MCG/ACT AERO  07/27/21  Yes [provider]  valsartan-hydrochlorothiazide (DIOVAN-HCT) 80-12.5 MG tablet Take 1 tablet by mouth daily. Patient taking differently: Take 1 tablet by mouth at bedtime. 07/17/21  Yes Charlynne Cousins, MD  aspirin EC 81 MG tablet Take 1 tablet (81 mg total) by mouth daily. Swallow whole. 07/17/21   Charlynne Cousins, MD  atorvastatin (LIPITOR) 40  MG tablet Take 1 tablet (40 mg total) by mouth daily. Patient not taking: Reported on 12/07/2021 07/17/21   Charlynne Cousins, MD  benzonatate (TESSALON) 100 MG capsule Take 1 capsule (100 mg total) by mouth 2 (two) times daily as needed for cough. Patient not taking: Reported on 12/07/2021 11/12/21   Hans Eden, NP  dolutegravir-lamiVUDine (DOVATO) 50-300 MG tablet Take 1 tablet by mouth daily. 08/08/21   Truman Hayward, MD  fluticasone furoate-vilanterol (BREO ELLIPTA) 200-25 MCG/ACT AEPB Inhale 1 puff into the lungs daily. Patient not taking: Reported on 12/07/2021 07/18/21   Charlynne Cousins, MD  ipratropium-albuterol (DUONEB) 0.5-2.5 (3) MG/3ML SOLN Take 3 mLs by nebulization every 6 (six) hours as needed. Patient not taking: Reported on 12/07/2021 11/12/21   Hans Eden, NP  isosorbide-hydrALAZINE (BIDIL) 20-37.5 MG tablet Take 1 tablet by mouth 3 (three) times daily. Patient not taking: Reported on 12/07/2021 08/08/21   [name], [name], MD  Multiple Vitamin (MULTIVITAMIN) tablet Take 1 tablet by mouth daily. Patient not taking: Reported on 12/07/2021    [provider]  predniSONE (DELTASONE) 20 MG tablet Take 2 tablets (40 mg total) by mouth daily. 11/12/21   Hans Eden, NP      Allergies    Patient has no known allergies.    Review of Systems   Review of Systems Negative except as per HPI Physical Exam Updated Vital Signs BP (!) 182/86  Pulse 68   Temp 97.8 F (36.6 C) (Oral)   Resp 16   Ht 6' (1.829 m)   Wt 70.3 kg   SpO2 99%   BMI 21.02 kg/m  Physical Exam Vitals and nursing note reviewed.  Constitutional:      General: He is not in acute distress.    Appearance: He is well-developed. He is not diaphoretic.  HENT:     Head: Normocephalic and atraumatic.  Cardiovascular:     Rate and Rhythm: Normal rate and regular rhythm.  Pulmonary:     Effort: Pulmonary effort is normal.     Breath sounds: No decreased breath sounds or  wheezing.  Chest:     Chest wall: No tenderness.  Abdominal:     Palpations: Abdomen is soft.     Tenderness: There is no abdominal tenderness.  Musculoskeletal:     Right lower leg: No tenderness. No edema.     Left lower leg: No tenderness. No edema.  Skin:    General: Skin is warm and dry.     Findings: No erythema or rash.  Neurological:     Mental Status: He is alert and oriented to person, place, and time.  Psychiatric:        Behavior: Behavior normal.     ED Results / Procedures / Treatments   Labs (all labs ordered are listed, but only abnormal results are displayed) Labs Reviewed  CBC WITH DIFFERENTIAL/PLATELET - Abnormal; Notable for the following components:      Result Value   RBC 3.84 (*)    Hemoglobin 12.3 (*)    HCT 35.9 (*)    Neutro Abs 9.6 (*)    Lymphs Abs 0.4 (*)    All other components within normal limits  COMPREHENSIVE METABOLIC PANEL - Abnormal; Notable for the following components:   Chloride 113 (*)    CO2 21 (*)    Glucose, Bld 119 (*)    Creatinine, Ser 1.45 (*)    GFR, Estimated 54 (*)    All other components within normal limits  URINALYSIS, ROUTINE W REFLEX MICROSCOPIC - Abnormal; Notable for the following components:   Protein, ur 100 (*)    Bacteria, UA RARE (*)    All other components within normal limits  TROPONIN I (HIGH SENSITIVITY) - Abnormal; Notable for the following components:   Troponin I (High Sensitivity) 74 (*)    All other components within normal limits  TROPONIN I (HIGH SENSITIVITY) - Abnormal; Notable for the following components:   Troponin I (High Sensitivity) 74 (*)    All other components within normal limits  TROPONIN I (HIGH SENSITIVITY) - Abnormal; Notable for the following components:   Troponin I (High Sensitivity) 72 (*)    All other components within normal limits  SARS CORONAVIRUS 2 BY RT PCR  MRSA NEXT GEN BY PCR, NASAL  BRAIN NATRIURETIC PEPTIDE  TROPONIN I (HIGH SENSITIVITY)    EKG EKG  Interpretation  Date/Time:  Tuesday December 27 2021 08:37:39 EST Ventricular Rate:  66 PR Interval:  208 QRS Duration: 85 QT Interval:  426 QTC Calculation: 447 R Axis:   22 Text Interpretation: Sinus rhythm Probable left atrial enlargement LVH with secondary repolarization abnormality Anterior Q waves, possibly due to LVH Baseline wander in lead(s) III Confirmed by Dene Gentry [phone]) on 12/27/2021 9:41:51 AM  Radiology DG Chest 2 View  Result Date: 12/27/2021 CLINICAL DATA:  Shortness of breath and chest pain. EXAM: CHEST - 2 VIEW COMPARISON:  PA  Lat 09/03/2021 FINDINGS: The heart size and mediastinal contours are within normal limits. Both lungs are hyperinflated but clear. The visualized skeletal structures are unremarkable. IMPRESSION: 1. No acute radiographic chest findings. 2. Stable COPD chest. Electronically Signed   By: Telford Nab M.D.   On: 12/27/2021 03:52    Procedures .Critical Care  Performed by: Tacy Learn, PA-C Authorized by: Tacy Learn, PA-C   Critical care provider statement:    Critical care time (minutes):  30   Critical care was time spent personally by me on the following activities:  Development of treatment plan with patient or surrogate, discussions with consultants, evaluation of patient's response to treatment, examination of patient, ordering and review of laboratory studies, ordering and review of radiographic studies, ordering and performing treatments and interventions, pulse oximetry, re-evaluation of patient's condition and review of old charts     Medications Ordered in ED Medications  nicardipine (CARDENE) '20mg'$  in 0.86% saline 238m IV infusion (0.1 mg/ml) (12.5 mg/hr Intravenous New Bag/Given 12/27/21 1520)  methylPREDNISolone sodium succinate (SOLU-MEDROL) 40 mg/mL injection 40 mg (has no administration in time range)    Followed by  predniSONE (DELTASONE) tablet 40 mg (has no administration in time range)   ipratropium-albuterol (DUONEB) 0.5-2.5 (3) MG/3ML nebulizer solution 3 mL (3 mLs Nebulization Given 12/27/21 1429)  enoxaparin (LOVENOX) injection 40 mg (has no administration in time range)  acetaminophen (TYLENOL) tablet 650 mg (has no administration in time range)    Or  acetaminophen (TYLENOL) suppository 650 mg (has no administration in time range)  oxyCODONE (Oxy IR/ROXICODONE) immediate release tablet 5 mg (has no administration in time range)  ondansetron (ZOFRAN) tablet 4 mg (has no administration in time range)    Or  ondansetron (ZOFRAN) injection 4 mg (has no administration in time range)  Oral care mouth rinse (has no administration in time range)  Chlorhexidine Gluconate Cloth 2 % PADS 6 each (6 each Topical Given 12/27/21 1245)  predniSONE (DELTASONE) tablet 60 mg (60 mg Oral Given 12/27/21 0723)  ipratropium-albuterol (DUONEB) 0.5-2.5 (3) MG/3ML nebulizer solution 3 mL (3 mLs Nebulization Given 12/27/21 0726)  carvedilol (COREG) tablet 3.125 mg (3.125 mg Oral Given 12/27/21 0751)  hydrALAZINE (APRESOLINE) injection 5 mg (5 mg Intravenous Given 12/27/21 07425    ED Course/ Medical Decision Making/ A&P                           Medical Decision Making Risk Prescription drug management. Decision regarding hospitalization.   This patient presents to the ED for concern of shortness or breath with chest tightness, this involves an extensive number of treatment options, and is a complaint that carries with it a high risk of complications and morbidity.  The differential diagnosis includes but not limited to COPD exacerbation, ACS, hypertensive urgency, PE, medication non compliance    Co morbidities that complicate the patient evaluation  Kidney disease, HIV/AIDS, paroxysmal A-fib, COPD, CHF (EF 65-70 with grade 1 diastolic dysfunction), peripheral artery disease,  Additional history obtained:  External records from outside source obtained and reviewed including prior  labs on file including HIV test dated 12/07/2021, virus not detected, CD4 absolute count 1104.  Previously, CD4 429 1 year ago.   Lab Tests:  I Ordered, and personally interpreted labs.  The pertinent results include: Urinalysis with protein.  CBC without significant findings.  CMP with creatinine 1.45, slightly improved compared to prior on file.  BNP normal at 83.3.  Troponin elevated  at 6, repeat is unchanged at 60 (prior on file 59s).   Imaging Studies ordered:  I ordered imaging studies including chest x-ray I independently visualized and interpreted imaging which showed COPD, no acute changes I agree with the radiologist interpretation   Cardiac Monitoring: / EKG:  The patient was maintained on a cardiac monitor.  I personally viewed and interpreted the cardiac monitored which showed an underlying rhythm of: Sinus rhythm, rate 70   Consultations Obtained:  I requested consultation with the hospitalist, Dr. Marylyn Ishihara,  and discussed lab and imaging findings as well as pertinent plan - they recommend: Cardizem drip for management of blood pressure, will admit   Problem List / ED Course / Critical interventions / Medication management  66 year old male brought in by EMS early this morning for shortness of breath, thought to be related to a COPD exacerbation.  He was given a DuoNeb and Solu-Medrol.  After extensive wait in the ER, patient is finally seen in a room where he is found to be quite hypertensive.  He is not wheezing but feels like he would benefit from a nebulizer treatment.  Patient was provided with DuoNeb and home blood pressure medication without improvement in symptoms, lungs remain clear to auscultation.  He does not have any lower extremity edema.  When asked specifically about his chest pain, he states that his whole chest hurts, he cannot pinpoint an area, states that this is from his COPD exacerbation.  Patient was given dose of hydralazine with improvement in his blood  pressure.  His troponins are unchanged, his EKG is unchanged.  Discussed with her attending with plan discussed to admit to hospital service for hypertensive emergency.  Discussed with Dr. Marylyn Ishihara with [facility] hospitalist, patient's blood pressure which was improving is now worsening, recommends Cardizem drip. I ordered medication including prednisone, DuoNeb, Coreg, hydralazine, Cardizem for blood pressure Reevaluation of the patient after these medicines showed that the patient  variable, improved after Coreg and hydralazine, worsening and resulted in initiation of Cardizem drip with improvement. I have reviewed the patients home medicines and have made adjustments as needed   Social Determinants of Health:  Sees ID.   Test / Admission - Considered:  Admit for hypertensive urgency         Final Clinical Impression(s) / ED Diagnoses Final diagnoses:  Hypertensive urgency  Shortness of breath  Chest pain, unspecified type    Rx / DC Orders ED Discharge Orders     None         Tacy Learn, PA-C 12/27/21 1525    Valarie Merino, MD 12/28/21 [phone]

## 2021-12-27 NOTE — Progress Notes (Signed)
  Echocardiogram 2D Echocardiogram has been performed.  Kevin [name] 12/27/2021, 2:14 PM

## 2021-12-27 NOTE — Progress Notes (Signed)
Pt refused CPAP qhs.  Pt states that he is unable to get any sleep with a CPAP on.

## 2021-12-27 NOTE — ED Triage Notes (Signed)
Has been out of his albuterol and bloodpressure meds for 24 hours, now is tight in the chest and having trouble breathing.

## 2021-12-27 NOTE — H&P (Signed)
History and Physical    Patient: Kevin [name] OHY:[id number] DOB: Aug 25, 1955 DOA: 12/27/2021 DOS: the patient was seen and examined on 12/27/2021 PCP: Lucianne Lei, MD  Patient coming from: Home  Chief Complaint:  Chief Complaint  Patient presents with   Shortness of Breath   HPI: Kevin [name] is a 66 y.o. male with medical history significant of chronic diastolic HF, HIV, COPD, OSA on CPAP, HLD. Presenting with shortness of breath. His symptoms started yesterday w/ shortness of breath and productive cough. He says he ran out of his medicine days ago, so he didn't try anything to help. His coughing continued throughout the day. He developed bilateral chest pain with his coughing. He didn't have any F, N/V, D, sick contacts. He didn't have any palpitations, lightheadedness, dizziness, syncopal episodes, arm pain/numbness/tingling. He decided yesterday afternoon to go to Urgent Care. Upon examining him, they recommended that he go to the ED as he had elevated BP. He went home instead. Early this morning when his symptoms did not improve, he decided to call EMS for assistance. He denies any other aggravating of alleviating factors.   Review of Systems: As mentioned in the history of present illness. All other systems reviewed and are negative. 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   Past Surgical History:   Procedure Laterality Date   IR RADIOLOGIST EVAL & MGMT  05/31/2021   IR RADIOLOGIST EVAL & MGMT  07/26/2021   LEFT HEART CATH AND CORONARY ANGIOGRAPHY N/A 10/23/2016   Procedure: LEFT HEART CATH AND CORONARY ANGIOGRAPHY;  Surgeon: Leonie Man, MD;  Location: [facility];  Service: Cardiovascular;  Laterality: N/A;   LOWER EXTREMITY ANGIOGRAPHY N/A 07/17/2016   Procedure: Lower Extremity Angiography;  Surgeon: Lorretta Harp, MD;  Location: [facility];  Service: Cardiovascular;  Laterality: N/A;   LOWER EXTREMITY INTERVENTION N/A 06/05/2016   Procedure: Lower Extremity Intervention;  Surgeon: Lorretta Harp, MD;  Location: [facility];  Service: Cardiovascular;  Laterality: N/A;   PERIPHERAL VASCULAR ATHERECTOMY Right 07/17/2016   Procedure: Peripheral Vascular Atherectomy;  Surgeon: Lorretta Harp, MD;  Location: [facility];  Service: Cardiovascular;  Laterality: Right;  SFA   PERIPHERAL VASCULAR INTERVENTION  06/05/2016   Procedure: Peripheral Vascular Intervention;  Surgeon: Lorretta Harp, MD;  Location: [facility];  Service: Cardiovascular;;  left SFA   RADIOLOGY WITH ANESTHESIA Right 06/29/2021   Procedure: RIGHT RENAL CRYOABLATION;  Surgeon: Markus Daft, MD;  Location: [facility];  Service: Anesthesiology;  Laterality: Right;   Social History:  reports that he has been smoking cigarettes and e-cigarettes. He has a 4.20 pack-year smoking history. He has never used smokeless tobacco. He reports that he does not currently use alcohol after a past usage of about 2.0 standard drinks of alcohol per week. He reports  that he does not currently use drugs.  No Known Allergies  Family History  Problem Relation Age of Onset   High blood pressure Mother    Lupus Mother     Prior to Admission medications   Medication Sig Start Date End Date Taking? Authorizing Provider  albuterol (VENTOLIN HFA) 108 (90 Base) MCG/ACT inhaler Inhale 2 puffs into the lungs  every 6 (six) hours as needed for wheezing or shortness of breath. 11/12/21   Hans Eden, NP  aspirin EC 81 MG tablet Take 1 tablet (81 mg total) by mouth daily. Swallow whole. 07/17/21   Charlynne Cousins, MD  atorvastatin (LIPITOR) 40 MG tablet Take 1 tablet (40 mg total) by mouth daily. Patient not taking: Reported on 12/07/2021 07/17/21   Charlynne Cousins, MD  benzonatate (TESSALON) 100 MG capsule Take 1 capsule (100 mg total) by mouth 2 (two) times daily as needed for cough. Patient not taking: Reported on 12/07/2021 11/12/21   Hans Eden, NP  carvedilol (COREG) 3.125 MG tablet Take 1 tablet (3.125 mg total) by mouth 2 (two) times daily. 07/17/21 07/17/22  Charlynne Cousins, MD  dolutegravir-lamiVUDine (DOVATO) 50-300 MG tablet Take 1 tablet by mouth daily. 08/08/21   Truman Hayward, MD  DULERA 100-5 MCG/ACT AERO SMARTSIG:2 Puff(s) Via Inhaler Every Morning Patient not taking: Reported on 12/07/2021 07/27/21   [provider]  fluticasone furoate-vilanterol (BREO ELLIPTA) 200-25 MCG/ACT AEPB Inhale 1 puff into the lungs daily. Patient not taking: Reported on 12/07/2021 07/18/21   Charlynne Cousins, MD  ipratropium-albuterol (DUONEB) 0.5-2.5 (3) MG/3ML SOLN Take 3 mLs by nebulization every 6 (six) hours as needed. Patient not taking: Reported on 12/07/2021 11/12/21   Hans Eden, NP  isosorbide-hydrALAZINE (BIDIL) 20-37.5 MG tablet Take 1 tablet by mouth 3 (three) times daily. Patient not taking: Reported on 12/07/2021 08/08/21   [name], [name], MD  Multiple Vitamin (MULTIVITAMIN) tablet Take 1 tablet by mouth daily. Patient not taking: Reported on 12/07/2021    [provider]  predniSONE (DELTASONE) 20 MG tablet Take 2 tablets (40 mg total) by mouth daily. 11/12/21   White, Leitha Schuller, NP  valsartan-hydrochlorothiazide (DIOVAN-HCT) 80-12.5 MG tablet Take 1 tablet by mouth daily. 07/17/21   Charlynne Cousins, MD    Physical Exam: Vitals:    12/27/21 0838 12/27/21 0930 12/27/21 0945 12/27/21 1030  BP: (!) 211/105 (!) 206/84 (!) 183/114 (!) 228/117  Pulse:  70 63 73  Resp:  '16 18 14  '$ Temp:   (!) 97.4 F (36.3 C)   TempSrc:   Oral   SpO2:  96% 97% 98%  Weight:      Height:       General: 66 y.o. male resting in bed in NAD Eyes: PERRL, normal sclera ENMT: Nares patent w/o discharge, orophaynx clear, dentition normal, ears w/o discharge/lesions/ulcers Neck: Supple, trachea midline Cardiovascular: RRR, +S1, S2, no m/g/r, equal pulses throughout, reproducible chest pain Respiratory: mild scattered exp wheeze, no r/r, normal WOB on RA GI: BS+, NDNT, no masses noted, no organomegaly noted MSK: No e/c/c Neuro: A&O x 3, no focal deficits Psyc: Appropriate interaction and affect, calm/cooperative  Data Reviewed:  Results for orders placed or performed during the hospital encounter of 12/27/21 (from the past 24 hour(s))  CBC with Differential     Status: Abnormal   Collection Time: 12/27/21  7:22 AM  Result Value Ref Range   WBC 10.2 4.0 - 10.5 K/uL   RBC 3.84 (  L) 4.22 - 5.81 MIL/uL   Hemoglobin 12.3 (L) 13.0 - 17.0 g/dL   HCT 35.9 (L) 39.0 - 52.0 %   MCV 93.5 80.0 - 100.0 fL   MCH 32.0 26.0 - 34.0 pg   MCHC 34.3 30.0 - 36.0 g/dL   RDW 15.1 11.5 - 15.5 %   Platelets 273 150 - 400 K/uL   nRBC 0.0 0.0 - 0.2 %   Neutrophils Relative % 94 %   Neutro Abs 9.6 (H) 1.7 - 7.7 K/uL   Lymphocytes Relative 4 %   Lymphs Abs 0.4 (L) 0.7 - 4.0 K/uL   Monocytes Relative 1 %   Monocytes Absolute 0.1 0.1 - 1.0 K/uL   Eosinophils Relative 1 %   Eosinophils Absolute 0.1 0.0 - 0.5 K/uL   Basophils Relative 0 %   Basophils Absolute 0.0 0.0 - 0.1 K/uL   Immature Granulocytes 0 %   Abs Immature Granulocytes 0.03 0.00 - 0.07 K/uL  Troponin I (High Sensitivity)     Status: Abnormal   Collection Time: 12/27/21  7:22 AM  Result Value Ref Range   Troponin I (High Sensitivity) 74 (H) <18 ng/L  Comprehensive metabolic panel     Status:  Abnormal   Collection Time: 12/27/21  7:22 AM  Result Value Ref Range   Sodium 140 135 - 145 mmol/L   Potassium 4.0 3.5 - 5.1 mmol/L   Chloride 113 (H) 98 - 111 mmol/L   CO2 21 (L) 22 - 32 mmol/L   Glucose, Bld 119 (H) 70 - 99 mg/dL   BUN 23 8 - 23 mg/dL   Creatinine, Ser 1.45 (H) 0.61 - 1.24 mg/dL   Calcium 9.2 8.9 - 10.3 mg/dL   Total Protein 7.9 6.5 - 8.1 g/dL   Albumin 3.8 3.5 - 5.0 g/dL   AST 16 15 - 41 U/L   ALT 10 0 - 44 U/L   Alkaline Phosphatase 63 38 - 126 U/L   Total Bilirubin 0.7 0.3 - 1.2 mg/dL   GFR, Estimated 54 (L) >60 mL/min   Anion gap 6 5 - 15  Urinalysis, Routine w reflex microscopic     Status: Abnormal   Collection Time: 12/27/21  7:22 AM  Result Value Ref Range   Color, Urine YELLOW YELLOW   APPearance CLEAR CLEAR   Specific Gravity, Urine 1.013 1.005 - 1.030   pH 5.0 5.0 - 8.0   Glucose, UA NEGATIVE NEGATIVE mg/dL   Hgb urine dipstick NEGATIVE NEGATIVE   Bilirubin Urine NEGATIVE NEGATIVE   Ketones, ur NEGATIVE NEGATIVE mg/dL   Protein, ur 100 (A) NEGATIVE mg/dL   Nitrite NEGATIVE NEGATIVE   Leukocytes,Ua NEGATIVE NEGATIVE   WBC, UA 0-5 0 - 5 WBC/hpf   Bacteria, UA RARE (A) NONE SEEN   Hyaline Casts, UA PRESENT   Brain natriuretic peptide     Status: None   Collection Time: 12/27/21  7:22 AM  Result Value Ref Range   B Natriuretic Peptide 83.3 0.0 - 100.0 pg/mL  Troponin I (High Sensitivity)     Status: Abnormal   Collection Time: 12/27/21  9:39 AM  Result Value Ref Range   Troponin I (High Sensitivity) 74 (H) <18 ng/L   CXR: 1. No acute radiographic chest findings. 2. Stable COPD chest.  EKG: sinus, minimal ST elevation of V1, V2  Assessment and Plan: Hypertensive Urgency     - place in obs, SDU     - start cardene gtt; goal SBP 180     -  resume home BP regimen when confirmed     - no peripheral edema, CXR clear, BNP normal; don't think this is d/t HF exacerbation  COPD exacerbation     - improved breathing after steroids and nebs in  ambulance     - will continue nebs, steroids for now     - check COVID  Chest pain Elevated trp     - reproducible on exam     - trp elevated but flat     - EKG with minimal st elevation in V1, V2     - CP related to cough; monitor     - elevated trp is most likely demand; continue to trend, treat BP, will also check echo  Chronic HFpEF     - not in exacerbation     - resume home regimen when confirmed  HIV     - continue home regimen when confirmed  HLD     - continue home regimen  CKD 3b     - at baseline, watch nephrotoxins  OSA     - CPAP qHS  Advance Care Planning:   Code Status: FULL  Consults: None  Family Communication: None at bedside  Severity of Illness: The appropriate patient status for this patient is OBSERVATION. Observation status is judged to be reasonable and necessary in order to provide the required intensity of service to ensure the patient's safety. The patient's presenting symptoms, physical exam findings, and initial radiographic and laboratory data in the context of their medical condition is felt to place them at decreased risk for further clinical deterioration. Furthermore, it is anticipated that the patient will be medically stable for discharge from the hospital within 2 midnights of admission.   Author: Jonnie Finner, DO 12/27/2021 10:57 AM  For on call review [email or web address].

## 2021-12-27 NOTE — ED Notes (Signed)
Pt given urinal, made aware of need for sample.

## 2021-12-27 NOTE — Progress Notes (Signed)
Pt refused CPAP.  Kevin [name] "It causes more harm than good"

## 2021-12-28 DIAGNOSIS — I160 Hypertensive urgency: Secondary | ICD-10-CM | POA: Diagnosis not present

## 2021-12-28 DIAGNOSIS — E785 Hyperlipidemia, unspecified: Secondary | ICD-10-CM | POA: Diagnosis present

## 2021-12-28 DIAGNOSIS — F17210 Nicotine dependence, cigarettes, uncomplicated: Secondary | ICD-10-CM | POA: Diagnosis present

## 2021-12-28 DIAGNOSIS — B20 Human immunodeficiency virus [HIV] disease: Secondary | ICD-10-CM | POA: Diagnosis present

## 2021-12-28 DIAGNOSIS — J441 Chronic obstructive pulmonary disease with (acute) exacerbation: Secondary | ICD-10-CM | POA: Diagnosis present

## 2021-12-28 DIAGNOSIS — Z91199 Patient's noncompliance with other medical treatment and regimen due to unspecified reason: Secondary | ICD-10-CM | POA: Diagnosis not present

## 2021-12-28 DIAGNOSIS — I5032 Chronic diastolic (congestive) heart failure: Secondary | ICD-10-CM | POA: Diagnosis present

## 2021-12-28 DIAGNOSIS — N1832 Chronic kidney disease, stage 3b: Secondary | ICD-10-CM | POA: Diagnosis present

## 2021-12-28 DIAGNOSIS — Z7952 Long term (current) use of systemic steroids: Secondary | ICD-10-CM | POA: Diagnosis not present

## 2021-12-28 DIAGNOSIS — F1490 Cocaine use, unspecified, uncomplicated: Secondary | ICD-10-CM | POA: Diagnosis present

## 2021-12-28 DIAGNOSIS — Z85528 Personal history of other malignant neoplasm of kidney: Secondary | ICD-10-CM | POA: Diagnosis not present

## 2021-12-28 DIAGNOSIS — Z1152 Encounter for screening for COVID-19: Secondary | ICD-10-CM | POA: Diagnosis not present

## 2021-12-28 DIAGNOSIS — G4733 Obstructive sleep apnea (adult) (pediatric): Secondary | ICD-10-CM | POA: Diagnosis present

## 2021-12-28 DIAGNOSIS — Z7982 Long term (current) use of aspirin: Secondary | ICD-10-CM | POA: Diagnosis not present

## 2021-12-28 DIAGNOSIS — I480 Paroxysmal atrial fibrillation: Secondary | ICD-10-CM | POA: Diagnosis present

## 2021-12-28 DIAGNOSIS — I130 Hypertensive heart and chronic kidney disease with heart failure and stage 1 through stage 4 chronic kidney disease, or unspecified chronic kidney disease: Secondary | ICD-10-CM | POA: Diagnosis present

## 2021-12-28 DIAGNOSIS — Z832 Family history of diseases of the blood and blood-forming organs and certain disorders involving the immune mechanism: Secondary | ICD-10-CM | POA: Diagnosis not present

## 2021-12-28 DIAGNOSIS — R0602 Shortness of breath: Secondary | ICD-10-CM | POA: Diagnosis present

## 2021-12-28 DIAGNOSIS — G8929 Other chronic pain: Secondary | ICD-10-CM | POA: Diagnosis present

## 2021-12-28 DIAGNOSIS — I161 Hypertensive emergency: Secondary | ICD-10-CM | POA: Diagnosis not present

## 2021-12-28 DIAGNOSIS — I739 Peripheral vascular disease, unspecified: Secondary | ICD-10-CM | POA: Diagnosis present

## 2021-12-28 DIAGNOSIS — M545 Low back pain, unspecified: Secondary | ICD-10-CM | POA: Diagnosis present

## 2021-12-28 LAB — RAPID URINE DRUG SCREEN, HOSP PERFORMED
Amphetamines: NOT DETECTED
Barbiturates: NOT DETECTED
Benzodiazepines: NOT DETECTED
Cocaine: POSITIVE — AB
Opiates: NOT DETECTED
Tetrahydrocannabinol: NOT DETECTED

## 2021-12-28 LAB — COMPREHENSIVE METABOLIC PANEL
ALT: 10 U/L (ref 0–44)
AST: 17 U/L (ref 15–41)
Albumin: 3.3 g/dL — ABNORMAL LOW (ref 3.5–5.0)
Alkaline Phosphatase: 49 U/L (ref 38–126)
Anion gap: 6 (ref 5–15)
BUN: 36 mg/dL — ABNORMAL HIGH (ref 8–23)
CO2: 21 mmol/L — ABNORMAL LOW (ref 22–32)
Calcium: 8.9 mg/dL (ref 8.9–10.3)
Chloride: 112 mmol/L — ABNORMAL HIGH (ref 98–111)
Creatinine, Ser: 2.1 mg/dL — ABNORMAL HIGH (ref 0.61–1.24)
GFR, Estimated: 35 mL/min — ABNORMAL LOW (ref >60)
Glucose, Bld: 160 mg/dL — ABNORMAL HIGH (ref 70–99)
Potassium: 4.6 mmol/L (ref 3.5–5.1)
Sodium: 139 mmol/L (ref 135–145)
Total Bilirubin: 0.7 mg/dL (ref 0.3–1.2)
Total Protein: 6.8 g/dL (ref 6.5–8.1)

## 2021-12-28 LAB — CBC
HCT: 32 % — ABNORMAL LOW (ref 39.0–52.0)
Hemoglobin: 11 g/dL — ABNORMAL LOW (ref 13.0–17.0)
MCH: 32.1 pg (ref 26.0–34.0)
MCHC: 34.4 g/dL (ref 30.0–36.0)
MCV: 93.3 fL (ref 80.0–100.0)
Platelets: 262 10*3/uL (ref 150–400)
RBC: 3.43 MIL/uL — ABNORMAL LOW (ref 4.22–5.81)
RDW: 15 % (ref 11.5–15.5)
WBC: 17 10*3/uL — ABNORMAL HIGH (ref 4.0–10.5)
nRBC: 0 % (ref 0.0–0.2)

## 2021-12-28 LAB — TROPONIN I (HIGH SENSITIVITY): Troponin I (High Sensitivity): 61 ng/L — ABNORMAL HIGH (ref <18)

## 2021-12-28 MED ORDER — AMLODIPINE BESYLATE 10 MG PO TABS
10.0000 mg | ORAL_TABLET | Freq: Every day | ORAL | Status: DC
  Administered 2021-12-29: 10 mg via ORAL
  Filled 2021-12-28: qty 1

## 2021-12-28 MED ORDER — ISOSORBIDE MONONITRATE ER 60 MG PO TB24
30.0000 mg | ORAL_TABLET | Freq: Every day | ORAL | Status: DC
  Administered 2021-12-28: 30 mg via ORAL
  Filled 2021-12-28: qty 1

## 2021-12-28 MED ORDER — GUAIFENESIN 100 MG/5ML PO LIQD
5.0000 mL | ORAL | Status: DC | PRN
  Administered 2021-12-28: 5 mL via ORAL
  Filled 2021-12-28: qty 10

## 2021-12-28 MED ORDER — MORPHINE SULFATE (PF) 2 MG/ML IV SOLN
2.0000 mg | INTRAVENOUS | Status: DC | PRN
  Administered 2021-12-28 – 2021-12-29 (×2): 2 mg via INTRAVENOUS
  Filled 2021-12-28 (×2): qty 1

## 2021-12-28 MED ORDER — AMLODIPINE BESYLATE 5 MG PO TABS
5.0000 mg | ORAL_TABLET | Freq: Every day | ORAL | Status: DC
  Administered 2021-12-28: 5 mg via ORAL
  Filled 2021-12-28: qty 1

## 2021-12-28 MED ORDER — OXYCODONE HCL 5 MG PO TABS: 5.0000 mg | ORAL_TABLET | ORAL | Status: DC | PRN

## 2021-12-28 MED ORDER — CHLORHEXIDINE GLUCONATE CLOTH 2 % EX PADS: 6.0000 | MEDICATED_PAD | Freq: Every day | CUTANEOUS | Status: DC

## 2021-12-28 MED ORDER — CARVEDILOL 3.125 MG PO TABS: 3.1250 mg | ORAL_TABLET | Freq: Two times a day (BID) | ORAL | Status: DC

## 2021-12-28 MED ORDER — HYDRALAZINE HCL 20 MG/ML IJ SOLN
10.0000 mg | INTRAMUSCULAR | Status: DC | PRN
  Administered 2021-12-28 – 2021-12-29 (×2): 10 mg via INTRAVENOUS
  Filled 2021-12-28 (×2): qty 1

## 2021-12-28 MED ORDER — HYDRALAZINE HCL 50 MG PO TABS
50.0000 mg | ORAL_TABLET | Freq: Three times a day (TID) | ORAL | Status: DC
  Administered 2021-12-28 – 2021-12-29 (×3): 50 mg via ORAL
  Filled 2021-12-28 (×3): qty 1

## 2021-12-28 MED ORDER — ISOSORB DINITRATE-HYDRALAZINE 20-37.5 MG PO TABS
1.0000 | ORAL_TABLET | Freq: Three times a day (TID) | ORAL | Status: DC
  Administered 2021-12-28 (×2): 1 via ORAL
  Filled 2021-12-28 (×3): qty 1

## 2021-12-28 MED ORDER — PANTOPRAZOLE SODIUM 40 MG PO TBEC
40.0000 mg | DELAYED_RELEASE_TABLET | Freq: Every day | ORAL | Status: DC
  Administered 2021-12-28 – 2021-12-29 (×2): 40 mg via ORAL
  Filled 2021-12-28 (×2): qty 1

## 2021-12-28 MED ORDER — PREDNISONE 20 MG PO TABS
40.0000 mg | ORAL_TABLET | Freq: Every day | ORAL | Status: DC
  Administered 2021-12-29: 40 mg via ORAL
  Filled 2021-12-28: qty 2

## 2021-12-28 MED ORDER — CARVEDILOL 6.25 MG PO TABS
6.2500 mg | ORAL_TABLET | Freq: Two times a day (BID) | ORAL | Status: DC
  Administered 2021-12-28: 6.25 mg via ORAL
  Filled 2021-12-28: qty 1

## 2021-12-28 MED ORDER — CARVEDILOL 3.125 MG PO TABS
3.1250 mg | ORAL_TABLET | Freq: Two times a day (BID) | ORAL | Status: DC
  Administered 2021-12-28 – 2021-12-29 (×2): 3.125 mg via ORAL
  Filled 2021-12-28 (×2): qty 1

## 2021-12-28 MED ORDER — NITROGLYCERIN 0.4 MG SL SUBL
0.4000 mg | SUBLINGUAL_TABLET | SUBLINGUAL | Status: DC | PRN
  Administered 2021-12-28: 0.4 mg via SUBLINGUAL
  Filled 2021-12-28: qty 1

## 2021-12-28 NOTE — TOC Initial Note (Addendum)
Transition of Care [facility]) - Initial/Assessment Note    Patient Details  Name: Kevin [name] MRN: [id number] Date of Birth: 02-Jul-1955  Transition of Care [facility]) CM/SW Contact:    Roseanne Kaufman, RN Phone Number: 12/28/2021, 1:46 PM  Clinical Narrative:   Jackquline [place] consult for PCP needs and medication assistance. This RNCM spoke with patient at bedside. Patient reports he no longer chooses to see his current PCP and is requesting assistance with a new PCP. This RNCM offered [place] primary care practices accepting new patients, however patient reports he does not want to have a PCP that's an intern or not a MD. This RNCM explained that it may be a challenge due to PCP has to accept his insurance. Patient request assistance with appointment with [facility]. This RNCM contacted Clarise Cruz with [facility] [phone], no appointment until Jan 2024. This RNCM contacted [facility] [phone] who has an appointment available on 11/30 at 2pm, this RNCM will attach to AVS.    Patient also requesting to have medications at discharge. This RNCM will follow for possible [place] meds to bed prior to discharge.              TOC will continue to follow.  Expected Discharge Plan: Home/Self Care Barriers to Discharge: Continued Medical Work up   Patient Goals and CMS Choice Patient states their goals for this hospitalization and ongoing recovery are:: return home with medications CMS Medicare.gov Compare Post Acute Care list provided to:: Other (Comment Required) (n/a) Choice offered to / list presented to : NA  Expected Discharge Plan and Services Expected Discharge Plan: Home/Self Care In-house Referral: NA Discharge Planning Services: CM Consult Post Acute Care Choice: NA Living arrangements for the past 2 months: Single Family Home                 DME Arranged: N/A DME Agency: NA       HH Arranged: NA HH Agency: NA        Prior Living  Arrangements/Services Living arrangements for the past 2 months: Single Family Home Lives with:: Relatives Patient language and need for interpreter reviewed:: Yes Do you feel safe going back to the place where you live?: Yes      Need for Family Participation in Patient Care: No (Comment) Care giver support system in place?: Yes (comment) Current home services: Other (comment) (n/a) Criminal Activity/Legal Involvement Pertinent to Current Situation/Hospitalization: No - Comment as needed  Activities of Daily Living Home Assistive Devices/Equipment: Eyeglasses ADL Screening (condition at time of admission) Patient's cognitive ability adequate to safely complete daily activities?: Yes Is the patient deaf or have difficulty hearing?: No Does the patient have difficulty seeing, even when wearing glasses/contacts?: No Does the patient have difficulty concentrating, remembering, or making decisions?: Yes Patient able to express need for assistance with ADLs?: No Does the patient have difficulty dressing or bathing?: No Independently performs ADLs?: Yes (appropriate for developmental age) Does the patient have difficulty walking or climbing stairs?: Yes Weakness of Legs: None Weakness of Arms/Hands: None  Permission Sought/Granted Permission sought to share information with : Case Manager Permission granted to share information with : Yes, Verbal Permission Granted  Share Information with NAME: Case manager           Emotional Assessment Appearance:: Appears stated age Attitude/Demeanor/Rapport: Engaged Affect (typically observed): Accepting Orientation: : Oriented to Self, Oriented to Place, Oriented to  Time, Oriented to Situation Alcohol / Substance Use:  Tobacco Use Psych Involvement: No (comment)  Admission diagnosis:  Shortness of breath [R06.02] Hypertensive urgency [I16.0] Chest pain, unspecified type [R07.9] Hypertensive emergency [I16.1] Patient Active Problem List    Diagnosis Date Noted   Chronic heart failure with preserved ejection fraction (HFpEF) ([place]) 12/27/2021   HLD (hyperlipidemia) 12/27/2021   Chest pain 12/27/2021   Elevated troponin 76/81/1572   Acute diastolic heart failure ([place]) 07/14/2021   Right renal mass 06/29/2021   Renal lesion 06/29/2021   Papillary renal cell carcinoma ([place]) 06/15/2021   Hypertensive crisis    Acute respiratory failure with hypoxia (H[facility])    Dyspnea 04/04/2019   Callus of foot 07/10/2018   Hypertensive urgency 08/15/2017   Coronary artery disease involving native coronary artery of native heart with unstable angina pectoris ([place]) 10/19/2016   Easy bruising 08/11/2016   Claudication in peripheral vascular disease ([place]) 06/05/2016   Stage 3 chronic kidney disease ([place])    Peripheral arterial disease ([place]) 05/09/2016   Acute on chronic diastolic CHF (congestive heart failure), NYHA class 3 ([place]) 03/24/2016   OSA (obstructive sleep apnea) 03/24/2016   Essential hypertension 02/18/2016   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   Shortness of breath 02/07/2016   PAF (paroxysmal atrial fibrillation) ([place]) 02/07/2016   Tobacco abuse 02/07/2016   Acute on chronic renal insufficiency 02/07/2016   COPD exacerbation ([place]) 02/07/2016   Hypertensive emergency 02/07/2016   HIV disease ([place]) 08/27/2004   PCP:  Lucianne Lei, MD Pharmacy:   [facility] [place], [address] [place] [address] Phone: [phone] Fax: [phone]  [facility] [address] [place] [address] Phone: [phone] Fax: [place] [address] Phone: [phone] Fax: [place], [place] - [place] [facility] [place] [address] Phone: [phone] Fax:  [place], [place] - [place] [facility] [place] [address] Phone: [phone] Fax: [phone]     Social Determinants of Health (SDOH) Interventions    Readmission Risk Interventions     No data to display

## 2021-12-28 NOTE — Progress Notes (Signed)
Initial Nutrition Assessment  DOCUMENTATION CODES:   Not applicable  INTERVENTION:  -Continue heart healthy diet as tolerated -Trial Ensure Plus HP or Ensure Enlive (350kcal, 20g protein) -Increase fiber and fluid intake at home to relieve/prevent constipation.  NUTRITION DIAGNOSIS:  Predicted suboptimal nutrient intake related to poor appetite as evidenced by other (comment) (diet recall).  GOAL:  Patient will meet greater than or equal to 90% of their needs  MONITOR:  PO intake, Supplement acceptance  REASON FOR ASSESSMENT:  Malnutrition Screening Tool   ASSESSMENT:  Pt is a 66yo M with PMH of chronic diastolic HF, HIV, COPD, OSA on CPAP, and HLD who presents with SOB and productive cough.  Visited pt at bedside this AM. He reports poor appetite for a while now. Reports only eating at home because he has to take medicine. Pt will eat 1 meal/day and then snack. No significant weight loss noted. Reports weight fluctuates from the 150s-160s. C/o chronic constipation. No physical signs of malnutrition noted.  Discussed nutrition for constipation, encouraged adequate fluid and fiber intake. Discussed the importance of a well balanced diet and regular meals. Suggested using a nutrition shake instead of skipping a meals. Pt open to this idea. Order Ensure for a trial this evening. Provided coupons if pt likes ONS.   Medications reviewed and include: liptior, MVI, protonix, prednisone, morphine  Labs reviewed: BG:116(likely elevated with steroid), BUN:40, Cr:2.05, GFR:36  NUTRITION - FOCUSED PHYSICAL EXAM:  Flowsheet Row Most Recent Value  Orbital Region No depletion  Upper Arm Region No depletion  Thoracic and Lumbar Region No depletion  Buccal Region No depletion  Temple Region No depletion  Clavicle Bone Region No depletion  Clavicle and Acromion Bone Region No depletion  Scapular Bone Region Unable to assess  Dorsal Hand No depletion  Patellar Region No depletion   Anterior Thigh Region No depletion  Posterior Calf Region No depletion  Hair Reviewed  Eyes Reviewed  Mouth Reviewed  Skin Reviewed  Nails Reviewed       Diet Order:   Diet Order             Diet - low sodium heart healthy           Diet Heart Room service appropriate? Yes; Fluid consistency: Thin  Diet effective now                   EDUCATION NEEDS:  Education needs have been addressed  Skin:  Skin Assessment: Reviewed RN Assessment  Last BM:  PTA  Height:  Ht Readings from Last 1 Encounters:  12/27/21 6' (1.829 m)   Weight:  Wt Readings from Last 1 Encounters:  12/27/21 70.3 kg    BMI:  Body mass index is 21.02 kg/m.  Estimated Nutritional Needs:  Kcal:  1755-2110kcal Protein:  85-105g Fluid:  1755-2163m  KCandise Bowens MS, RD, LDN, CNSC See AMiON for contact information

## 2021-12-28 NOTE — Progress Notes (Signed)
Patient informed this nurse that he does not wear his CPAP at home and will not wear CPAP here in the hospital. Patient educated on the importance of wearing ordered CPAP; patient gave verbal understanding of this and refused.

## 2021-12-28 NOTE — Progress Notes (Addendum)
[facility] Progress Note Patient: Kevin [name] WNU:[id number] DOB: 01/10/1956 DOA: 12/27/2021  DOS: the patient was seen and examined on 12/28/2021  Brief hospital course: ABRIAN HANOVER is a 66 y.o. male with medical history significant of chronic diastolic HF, HIV, COPD, OSA on CPAP, HLD, CKD cocaine positive. Presented to the hospital with shortness of breath found to have hypertensive urgency.  Treated with nicardipine drip. Assessment and Plan: Hypertensive urgency. Blood pressure significantly elevated. Started on cardiac monitor. I will add amlodipine as well as BiDil. Patient is on Coreg which I will continue. Continue ARB as well. Cocaine positive. Avoid selective beta-blockers.  Cocaine positive. Likely cause of patient's presentation with hypotension no chest pain or shortness of breath. Patient actually denied any substance abuse. We will monitor for now.  Possible COPD exacerbation. Bilateral expiratory wheezing at the time of admission. Currently wheezing appears to be improving. COVID-negative. Chest x-ray unremarkable. Does not appear to have any volume overload. On prednisone which I will continue.  Chest pain. Elevated troponin. Currently resolved. Pain is actually reproducible at the time of admission. EKG not consistent with any ACS pattern. Troponins minimally elevated and remaining flat. Echocardiogram shows preserved EF and diastolic dysfunction. Addendum: Another episode of chest pain reported by the patient on 11/15. EKG unremarkable for any new changes. We will check troponin x1. Treat with morphine. Nitroglycerin sublingual.  Pain reported as a pleuritic nature.  Monitor. Add PPI.  Di[facility] Tangi Shroff 5:06 PM 12/28/2021    Chronic HFpEF. Volume status adequate. No indication for diuretics.  History of HIV. Continue home regimen  CKD 3B. Baseline serum creatinine around 2. Serum serum creatinine is around 2 as  well. Monitor.  OSA. Noncompliant with CPAP. Monitor.  Subjective: Denies any acute complaint.  No nausea no vomiting no fever no chills.  Intermittent dizziness reported.  No other focal deficit.  Physical Exam: General: in mild distress;  Cardiovascular: S1 and S2 Present, no Murmur Respiratory: normal respiratory effort, Bilateral Air entry present, no Crackles, no wheezes Abdomen: Bowel Sound present, Non tender  Extremities: no edema Neurology: alert and oriented to time, place, and person   Data Reviewed: I have Reviewed nursing notes, Vitals, and Lab results. Since last encounter, pertinent lab results CBC and BMP   . I have ordered test including CBC and BMP  .   Disposition: Status is: Inpatient Remains inpatient appropriate because: Blood pressure still elevated currently on IV infusion for blood pressure control.  enoxaparin (LOVENOX) injection 40 mg Start: 12/27/21 2200   Family Communication: No one at bedside Level of care: Stepdown continue stepdown as the patient is still on nicardipine drip. Vitals:   12/28/21 1100 12/28/21 1130 12/28/21 1200 12/28/21 1334  BP: (!) 150/103 (!) 174/89 (!) 171/102   Pulse: 78  70   Resp: 14  14   Temp:    98 F (36.7 C)  TempSrc:    Oral  SpO2: 96%  97%   Weight:      Height:         Author: Berle Mull, MD 12/28/2021 3:23 PM  Please look on [email or web address] to find out who is on call.

## 2021-12-28 NOTE — Hospital Course (Signed)
Kevin [name] is a 66 y.o. male with medical history significant of chronic diastolic HF, HIV, COPD, OSA on CPAP, HLD, CKD cocaine positive. Presented to the hospital with shortness of breath found to have hypertensive urgency.  Treated with nicardipine drip.

## 2021-12-28 NOTE — Progress Notes (Signed)
Pt continues to refuse CPAP °

## 2021-12-29 ENCOUNTER — Other Ambulatory Visit (HOSPITAL_COMMUNITY): Payer: Self-pay

## 2021-12-29 ENCOUNTER — Other Ambulatory Visit: Payer: Self-pay

## 2021-12-29 DIAGNOSIS — I160 Hypertensive urgency: Secondary | ICD-10-CM | POA: Diagnosis not present

## 2021-12-29 LAB — BASIC METABOLIC PANEL
Anion gap: 5 (ref 5–15)
BUN: 40 mg/dL — ABNORMAL HIGH (ref 8–23)
CO2: 22 mmol/L (ref 22–32)
Calcium: 8.7 mg/dL — ABNORMAL LOW (ref 8.9–10.3)
Chloride: 111 mmol/L (ref 98–111)
Creatinine, Ser: 2.05 mg/dL — ABNORMAL HIGH (ref 0.61–1.24)
GFR, Estimated: 36 mL/min — ABNORMAL LOW (ref >60)
Glucose, Bld: 116 mg/dL — ABNORMAL HIGH (ref 70–99)
Potassium: 4.3 mmol/L (ref 3.5–5.1)
Sodium: 138 mmol/L (ref 135–145)

## 2021-12-29 LAB — CBC
HCT: 32.9 % — ABNORMAL LOW (ref 39.0–52.0)
Hemoglobin: 10.9 g/dL — ABNORMAL LOW (ref 13.0–17.0)
MCH: 32.2 pg (ref 26.0–34.0)
MCHC: 33.1 g/dL (ref 30.0–36.0)
MCV: 97.1 fL (ref 80.0–100.0)
Platelets: 262 10*3/uL (ref 150–400)
RBC: 3.39 MIL/uL — ABNORMAL LOW (ref 4.22–5.81)
RDW: 15.5 % (ref 11.5–15.5)
WBC: 23.8 10*3/uL — ABNORMAL HIGH (ref 4.0–10.5)
nRBC: 0 % (ref 0.0–0.2)

## 2021-12-29 LAB — MAGNESIUM: Magnesium: 2.4 mg/dL (ref 1.7–2.4)

## 2021-12-29 MED ORDER — MELATONIN 3 MG PO TABS
3.0000 mg | ORAL_TABLET | Freq: Every day | ORAL | Status: DC
  Administered 2021-12-29: 3 mg via ORAL
  Filled 2021-12-29: qty 1

## 2021-12-29 MED ORDER — IPRATROPIUM-ALBUTEROL 0.5-2.5 (3) MG/3ML IN SOLN: 3.0000 mL | Freq: Two times a day (BID) | RESPIRATORY_TRACT | Status: DC

## 2021-12-29 MED ORDER — ISOSORBIDE MONONITRATE ER 60 MG PO TB24: 60.0000 mg | ORAL_TABLET | Freq: Every day | ORAL | 0 refills | Status: DC

## 2021-12-29 MED ORDER — CARVEDILOL 3.125 MG PO TABS: 3.1250 mg | ORAL_TABLET | Freq: Two times a day (BID) | ORAL | 0 refills | Status: DC

## 2021-12-29 MED ORDER — HYDRALAZINE HCL 50 MG PO TABS
50.0000 mg | ORAL_TABLET | Freq: Once | ORAL | Status: AC
  Administered 2021-12-29: 50 mg via ORAL
  Filled 2021-12-29: qty 1

## 2021-12-29 MED ORDER — ISOSORBIDE MONONITRATE ER 60 MG PO TB24
60.0000 mg | ORAL_TABLET | Freq: Every day | ORAL | 0 refills | Status: DC
  Filled 2021-12-29: qty 30, 30d supply, fill #0

## 2021-12-29 MED ORDER — HYDRALAZINE HCL 50 MG PO TABS
100.0000 mg | ORAL_TABLET | Freq: Three times a day (TID) | ORAL | Status: DC
  Administered 2021-12-29: 100 mg via ORAL
  Filled 2021-12-29: qty 2

## 2021-12-29 MED ORDER — PREDNISONE 20 MG PO TABS: 30.0000 mg | ORAL_TABLET | Freq: Every day | ORAL | Status: DC

## 2021-12-29 MED ORDER — PREDNISONE 10 MG PO TABS: ORAL_TABLET | ORAL | 0 refills | Status: DC

## 2021-12-29 MED ORDER — VALSARTAN 80 MG PO TABS
80.0000 mg | ORAL_TABLET | Freq: Every day | ORAL | 0 refills | Status: DC
  Filled 2021-12-29: qty 30, 30d supply, fill #0

## 2021-12-29 MED ORDER — DULERA 100-5 MCG/ACT IN AERO: 2.0000 | INHALATION_SPRAY | Freq: Two times a day (BID) | RESPIRATORY_TRACT | 0 refills | Status: DC

## 2021-12-29 MED ORDER — CARVEDILOL 3.125 MG PO TABS
3.1250 mg | ORAL_TABLET | Freq: Two times a day (BID) | ORAL | 0 refills | Status: DC
  Filled 2021-12-29: qty 60, 30d supply, fill #0

## 2021-12-29 MED ORDER — AMLODIPINE BESYLATE 10 MG PO TABS
10.0000 mg | ORAL_TABLET | Freq: Every day | ORAL | 0 refills | Status: DC
  Filled 2021-12-29: qty 30, 30d supply, fill #0

## 2021-12-29 MED ORDER — PANTOPRAZOLE SODIUM 40 MG PO TBEC
40.0000 mg | DELAYED_RELEASE_TABLET | Freq: Every day | ORAL | 0 refills | Status: DC
  Filled 2021-12-29: qty 30, 30d supply, fill #0

## 2021-12-29 MED ORDER — PANTOPRAZOLE SODIUM 40 MG PO TBEC: 40.0000 mg | DELAYED_RELEASE_TABLET | Freq: Every day | ORAL | 0 refills | Status: DC

## 2021-12-29 MED ORDER — PREDNISONE 10 MG PO TABS
ORAL_TABLET | ORAL | 0 refills | Status: DC
  Filled 2021-12-29: qty 18, 9d supply, fill #0

## 2021-12-29 MED ORDER — ALBUTEROL SULFATE HFA 108 (90 BASE) MCG/ACT IN AERS
2.0000 | INHALATION_SPRAY | Freq: Four times a day (QID) | RESPIRATORY_TRACT | 0 refills | Status: DC | PRN
  Filled 2021-12-29: qty 18, 25d supply, fill #0

## 2021-12-29 MED ORDER — VALSARTAN 80 MG PO TABS: 80.0000 mg | ORAL_TABLET | Freq: Every day | ORAL | 0 refills | Status: DC

## 2021-12-29 MED ORDER — ENSURE ENLIVE PO LIQD: 237.0000 mL | ORAL | Status: DC

## 2021-12-29 MED ORDER — AMLODIPINE BESYLATE 10 MG PO TABS: 10.0000 mg | ORAL_TABLET | Freq: Every day | ORAL | 0 refills | Status: DC

## 2021-12-29 MED ORDER — DULERA 100-5 MCG/ACT IN AERO
2.0000 | INHALATION_SPRAY | Freq: Two times a day (BID) | RESPIRATORY_TRACT | 0 refills | Status: DC
  Filled 2021-12-29: qty 13, 30d supply, fill #0

## 2021-12-29 MED ORDER — ISOSORBIDE MONONITRATE ER 60 MG PO TB24
60.0000 mg | ORAL_TABLET | Freq: Every day | ORAL | Status: DC
  Administered 2021-12-29: 60 mg via ORAL
  Filled 2021-12-29: qty 1

## 2021-12-29 NOTE — TOC Transition Note (Signed)
Transition of Care [facility]) - CM/SW Discharge Note   Patient Details  Name: RISHI VICARIO MRN: [id number] Date of Birth: 04-May-1955  Transition [facility]) CM/SW Contact:  Vassie Moselle, LCSW Phone Number: 12/29/2021, 2:52 PM   Clinical Narrative:    Met with pt to discuss medication needs. Pt shares he does not want to wait for medicine to be delivered to his room before he leaves. Pt also states he is not able to afford his $3 copay per medicine this month. Pt shares he has not used [facility] before and would qualify for their first time free fill on medications. Pt was provided with address to pharmacy and was agreeable to pick up his medications at discharge. MD and RN notified.     Final next level of care: Home/Self Care Barriers to Discharge: Barriers Resolved   Patient Goals and CMS Choice Patient states their goals for this hospitalization and ongoing recovery are:: return home with medications CMS Medicare.gov Compare Post Acute Care list provided to:: Other (Comment Required) (n/a) Choice offered to / list presented to : NA  Discharge Placement                       Discharge Plan and Services In-house Referral: NA Discharge Planning Services: CM Consult Post Acute Care Choice: NA          DME Arranged: N/A DME Agency: NA       HH Arranged: NA HH Agency: NA        Social Determinants of Health (SDOH) Interventions     Readmission Risk Interventions    12/29/2021    2:42 PM  Readmission Risk Prevention Plan  Transportation Screening Complete  Medication Review ([place]) Complete  PCP or Specialist appointment within 3-5 days of discharge Complete  HRI or [place] Complete  SW Recovery Care/Counseling Consult Complete  [place] Not Applicable

## 2021-12-29 NOTE — Progress Notes (Signed)
Pt stable at time of transfer. Belongings collected. Report given

## 2021-12-30 NOTE — Discharge Summary (Signed)
Physician Discharge Summary   Patient: Kevin [name] MRN: [id number] DOB: 1955-10-09  Admit date:     12/27/2021  Discharge date: 12/29/2021  Discharge Physician: Kevin [name]  PCP: Kevin Lei, MD  Recommendations at discharge: Follow-up with PCP in 1 week. Avoid substance abuse.   Follow-up Information     [facility]. Go on 01/12/2022.   Why: Go to your new patient/hospital follow up appointment with Kevin [name] on Thursday 01/12/22 at 2pm. Please arrive 15 minutes early. If you need to reschedule please call Kevin [name] at [phone]. Address:  [place], [place] [address]        Kevin Lei, MD .   Specialty: Family Medicine Contact information: [address] [place] [address] [phone]                Discharge Diagnoses: Principal Problem:   Hypertensive urgency Active Problems:   HIV disease ([place])   COPD exacerbation ([place])   Hypertensive emergency   OSA (obstructive sleep apnea)   Stage 3 chronic kidney disease ([place])   Chronic heart failure with preserved ejection fraction (HFpEF) ([place])   HLD (hyperlipidemia)   Chest pain   Elevated troponin  Hospital Course: Kevin [name] is a 66 y.o. male with medical history significant of chronic diastolic HF, HIV, COPD, OSA on CPAP, HLD, CKD cocaine positive. Presented to the hospital with shortness of breath found to have hypertensive urgency.  Treated with nicardipine drip. Assessment and Plan  Hypertensive urgency. Blood pressure significantly elevated. Started on nicardipine drip.  Was admitted to stepdown unit. Safely transition to oral antihypertensive regimen with amlodipine, Imdur, hydralazine and ARB as well as Coreg. UDS positive for cocaine which is most likely the cause of hypertension. New prescriptions provided and [facility].  TOC assisted the patient for  medications.  Cocaine positive. Likely cause of patient's presentation with hypertension and chest pain as well as shortness of breath. Patient actually denied any substance abuse.   Possible COPD exacerbation. Bilateral expiratory wheezing at the time of admission. Currently wheezing appears to be improving. COVID-negative. Chest x-ray unremarkable. Does not appear to have any volume overload. On prednisone which I will continue.   Chest pain. Elevated troponin. Currently resolved. Pain is actually reproducible at the time of admission.  At the time of my evaluation when the patient had recurrence of the chest pain it was worsening with movement. EKG not consistent with any ACS pattern. Troponins minimally elevated and remaining flat. Echocardiogram shows preserved EF and diastolic dysfunction. Continue imdur.  Recommended patient to stay away from cocaine use.  Chronic HFpEF. Volume status adequate. No indication for diuretics.   History of HIV. Continue home regimen   CKD 3B. Baseline serum creatinine around 2. Serum serum creatinine is around 2 as well. Monitor.   OSA. Noncompliant with CPAP. Monitor.  Consultants:  none  Procedures performed:  none  DISCHARGE MEDICATION: Allergies as of 12/29/2021   No Known Allergies      Medication List     STOP taking these medications    benzonatate 100 MG capsule Commonly known as: TESSALON   isosorbide-hydrALAZINE 20-37.5 MG tablet Commonly known as: BIDIL   valsartan-hydrochlorothiazide 80-12.5 MG tablet Commonly known as: DIOVAN-HCT       TAKE these medications    amLODipine 10 MG tablet Commonly known as: NORVASC Take 1 tablet (10 mg total) by  mouth daily.   aspirin EC 81 MG tablet Take 1 tablet (81 mg total) by mouth daily. Swallow whole.   atorvastatin 40 MG tablet Commonly known as: LIPITOR Take 1 tablet (40 mg total) by mouth daily.   carvedilol 3.125 MG tablet Commonly known as:  Coreg Take 1 tablet (3.125 mg total) by mouth 2 (two) times daily. What changed: when to take this   Dovato 50-300 MG tablet Generic drug: dolutegravir-lamiVUDine Take 1 tablet by mouth daily.   Dulera 100-5 MCG/ACT Aero Generic drug: mometasone-formoterol Inhale 2 puffs into the lungs in the morning and at bedtime. What changed: See the new instructions.   ipratropium-albuterol 0.5-2.5 (3) MG/3ML Soln Commonly known as: DUONEB Take 3 mLs by nebulization every 6 (six) hours as needed. What changed: reasons to take this   isosorbide mononitrate 60 MG 24 hr tablet Commonly known as: IMDUR Take 1 tablet (60 mg total) by mouth daily.   multivitamin tablet Take 1 tablet by mouth daily.   pantoprazole 40 MG tablet Commonly known as: PROTONIX Take 1 tablet (40 mg total) by mouth daily.   predniSONE 10 MG tablet Commonly known as: DELTASONE Take 3 tablets by mouth daily for 3 days, then 2 tablets daily for 3 days, then 1 tablet daily for 3 days, then stop What changed:  medication strength how much to take how to take this when to take this additional instructions   valsartan 80 MG tablet Commonly known as: Diovan Take 1 tablet (80 mg total) by mouth daily.   Ventolin HFA 108 (90 Base) MCG/ACT inhaler Generic drug: albuterol Inhale 2 puffs into the lungs every 6 (six) hours as needed for wheezing or shortness of breath.       Disposition: Home Diet recommendation: Cardiac diet  Discharge Exam: Vitals:   12/29/21 1000 12/29/21 1100 12/29/21 1215 12/29/21 1403  BP: (!) 171/78 (!) 173/59 (!) 154/94 (!) 144/91  Pulse: 78 61 61 67  Resp: (!) 22 11    Temp:   (!) 97.5 F (36.4 C)   TempSrc:   Oral   SpO2: 98% 98% 98%   Weight:      Height:       General: Appear in no distress; no visible Abnormal Neck Mass Or lumps, Conjunctiva normal Cardiovascular: S1 and S2 Present, no Murmur, Respiratory: good respiratory effort, Bilateral Air entry present and CTA, no  Crackles, no wheezes Abdomen: Bowel Sound present, Non tender  Extremities: no Pedal edema Neurology: alert and oriented to time, place, and person  Filed Weights   12/27/21 0321 12/27/21 1241  Weight: 72.6 kg 70.3 kg   Condition at discharge: stable  The results of significant diagnostics from this hospitalization (including imaging, microbiology, ancillary and laboratory) are listed below for reference.   Imaging Studies: ECHOCARDIOGRAM COMPLETE  Result Date: 12/27/2021    ECHOCARDIOGRAM REPORT   Patient Name:   Kevin [name] Date of Exam: 12/27/2021 Medical Rec #:  [id number]       Height:       72.0 in Accession #:    [id number]      Weight:       155.0 lb Date of Birth:  1955-12-30       BSA:          1.912 m Patient Age:    20 years        BP:           175/106 mmHg Patient Gender: M  HR:           67 bpm. Exam Location:  Inpatient Procedure: 2D Echo Indications:    chest pain  History:        Patient has prior history of Echocardiogram examinations, most                 recent 07/16/2021. CHF, COPD; Risk Factors:Dyslipidemia,                 Hypertension and Current Smoker.  Sonographer:    Harvie Junior Referring Phys: [id number] [place]  1. Left ventricular ejection fraction, by estimation, is 60 to 65%. The left ventricle has normal function. The left ventricle has no regional wall motion abnormalities. There is moderate concentric left ventricular hypertrophy. Left ventricular diastolic parameters are consistent with Grade I diastolic dysfunction (impaired relaxation).  2. Right ventricular systolic function is normal. The right ventricular size is normal. There is mildly elevated pulmonary artery systolic pressure.  3. Left atrial size was mildly dilated.  4. The mitral valve is normal in structure. No evidence of mitral valve regurgitation. No evidence of mitral stenosis.  5. The aortic valve is grossly normal. Aortic valve regurgitation is not visualized. No  aortic stenosis is present.  6. The inferior vena cava is normal in size with greater than 50% respiratory variability, suggesting right atrial pressure of 3 mmHg. Comparison(s): No significant change from prior study. FINDINGS  Left Ventricle: Left ventricular ejection fraction, by estimation, is 60 to 65%. The left ventricle has normal function. The left ventricle has no regional wall motion abnormalities. The left ventricular internal cavity size was normal in size. There is  moderate concentric left ventricular hypertrophy. Left ventricular diastolic parameters are consistent with Grade I diastolic dysfunction (impaired relaxation). Right Ventricle: The right ventricular size is normal. No increase in right ventricular wall thickness. Right ventricular systolic function is normal. There is mildly elevated pulmonary artery systolic pressure. The tricuspid regurgitant velocity is 2.99  m/s, and with an assumed right atrial pressure of 3 mmHg, the estimated right ventricular systolic pressure is 10.2 mmHg. Left Atrium: Left atrial size was mildly dilated. Right Atrium: Right atrial size was normal in size. Pericardium: There is no evidence of pericardial effusion. Mitral Valve: The mitral valve is normal in structure. No evidence of mitral valve regurgitation. No evidence of mitral valve stenosis. Tricuspid Valve: The tricuspid valve is normal in structure. Tricuspid valve regurgitation is trivial. No evidence of tricuspid stenosis. Aortic Valve: The aortic valve is grossly normal. Aortic valve regurgitation is not visualized. No aortic stenosis is present. Aortic valve mean gradient measures 4.0 mmHg. Aortic valve peak gradient measures 10.5 mmHg. Aortic valve area, by VTI measures  4.40 cm. Pulmonic Valve: The pulmonic valve was not well visualized. Pulmonic valve regurgitation is not visualized. No evidence of pulmonic stenosis. Aorta: The aortic root and ascending aorta are structurally normal, with no  evidence of dilitation. Venous: The inferior vena cava is normal in size with greater than 50% respiratory variability, suggesting right atrial pressure of 3 mmHg. IAS/Shunts: The atrial septum is grossly normal.  LEFT VENTRICLE PLAX 2D LVIDd:         5.30 cm      Diastology LVIDs:         3.30 cm      LV e' medial:    4.46 cm/s LV PW:         1.20 cm      LV E/e' medial:  18.8 LV IVS:        1.20 cm      LV e' lateral:   5.33 cm/s LVOT diam:     2.40 cm      LV E/e' lateral: 15.7 LV SV:         127 LV SV Index:   66 LVOT Area:     4.52 cm  LV Volumes (MOD) LV vol d, MOD A2C: 132.0 ml LV vol d, MOD A4C: 158.0 ml LV vol s, MOD A2C: 53.1 ml LV vol s, MOD A4C: 69.6 ml LV SV MOD A2C:     78.9 ml LV SV MOD A4C:     158.0 ml LV SV MOD BP:      85.3 ml RIGHT VENTRICLE RV Basal diam:  3.40 cm RV Mid diam:    2.50 cm TAPSE (M-mode): 2.4 cm LEFT ATRIUM             Index        RIGHT ATRIUM           Index LA diam:        3.40 cm 1.78 cm/m   RA Area:     12.10 cm LA Vol (A2C):   70.7 ml 36.98 ml/m  RA Volume:   24.60 ml  12.87 ml/m LA Vol (A4C):   44.7 ml 23.38 ml/m LA Biplane Vol: 60.7 ml 31.75 ml/m  AORTIC VALVE                    PULMONIC VALVE AV Area (Vmax):    4.16 cm     PV Vmax:       1.74 m/s AV Area (Vmean):   4.05 cm     PV Peak grad:  12.1 mmHg AV Area (VTI):     4.40 cm AV Vmax:           162.00 cm/s AV Vmean:          97.400 cm/s AV VTI:            0.289 m AV Peak Grad:      10.5 mmHg AV Mean Grad:      4.0 mmHg LVOT Vmax:         149.00 cm/s LVOT Vmean:        87.300 cm/s LVOT VTI:          0.281 m LVOT/AV VTI ratio: 0.97  AORTA Ao Root diam: 3.60 cm Ao Asc diam:  3.40 cm MITRAL VALVE                TRICUSPID VALVE MV Area (PHT): 2.54 cm     TR Peak grad:   35.8 mmHg MV Decel Time: 299 msec     TR Vmax:        299.00 cm/s MV E velocity: 83.90 cm/s MV A velocity: 121.00 cm/s  SHUNTS MV E/A ratio:  0.69         Systemic VTI:  0.28 m                             Systemic Diam: 2.40 cm Buford Dresser MD Electronically signed by Buford Dresser MD Signature Date/Time: 12/27/2021/5:21:49 PM    Final    DG Chest 2 View  Result Date: 12/27/2021 CLINICAL DATA:  Shortness of breath and chest pain. EXAM: CHEST - 2 VIEW COMPARISON:  PA Lat 09/03/2021 FINDINGS: The heart size and mediastinal  contours are within normal limits. Both lungs are hyperinflated but clear. The visualized skeletal structures are unremarkable. IMPRESSION: 1. No acute radiographic chest findings. 2. Stable COPD chest. Electronically Signed   By: Telford Nab M.D.   On: 12/27/2021 03:52    Microbiology: Results for orders placed or performed during the hospital encounter of 12/27/21  SARS Coronavirus 2 by RT PCR (hospital order, performed in [facility] hospital lab) *cepheid single result test* Anterior Nasal Swab     Status: None   Collection Time: 12/27/21 11:32 AM   Specimen: Anterior Nasal Swab  Result Value Ref Range Status   SARS Coronavirus 2 by RT PCR NEGATIVE NEGATIVE Final    Comment: (NOTE) SARS-CoV-2 target nucleic acids are NOT DETECTED.  The SARS-CoV-2 RNA is generally detectable in upper and lower respiratory specimens during the acute phase of infection. The lowest concentration of SARS-CoV-2 viral copies this assay can detect is 250 copies / mL. A negative result does not preclude SARS-CoV-2 infection and should not be used as the sole basis for treatment or other patient management decisions.  A negative result may occur with improper specimen collection / handling, submission of specimen other than nasopharyngeal swab, presence of viral mutation(s) within the areas targeted by this assay, and inadequate number of viral copies (<250 copies / mL). A negative result must be combined with clinical observations, patient history, and epidemiological information.  Fact Sheet for Patients:   https://www.[employer].info/  Fact Sheet for Healthcare  Providers: https://hall.com/  This test is not yet approved or  cleared by the [place] FDA and has been authorized for detection and/or diagnosis of SARS-CoV-2 by FDA under an Emergency Use Authorization (EUA).  This EUA will remain in effect (meaning this test can be used) for the duration of the COVID-19 declaration under Section 564(b)(1) of the Act, 21 U.S.C. section 360bbb-3(b)(1), unless the authorization is terminated or revoked sooner.  Performed at [facility], [place] [address], [place], [place] [address]   MRSA Next Gen by PCR, Nasal     Status: None   Collection Time: 12/27/21 12:07 PM   Specimen: Nasal Mucosa; Nasal Swab  Result Value Ref Range Status   MRSA by PCR Next Gen NOT DETECTED NOT DETECTED Final    Comment: (NOTE) The GeneXpert MRSA Assay (FDA approved for NASAL specimens only), is one component of a comprehensive MRSA colonization surveillance program. It is not intended to diagnose MRSA infection nor to guide or monitor treatment for MRSA infections. Test performance is not FDA approved in patients less than 61 years old. Performed at [facility], [place] [address] [place], [place] [address]    Labs: CBC: Recent Labs  Lab 12/27/21 0722 12/28/21 0228 12/29/21 0245  WBC 10.2 17.0* 23.8*  NEUTROABS 9.6*  --   --   HGB 12.3* 11.0* 10.9*  HCT 35.9* 32.0* 32.9*  MCV 93.5 93.3 97.1  PLT 273 262 948   Basic Metabolic Panel: Recent Labs  Lab 12/27/21 0722 12/28/21 0228 12/29/21 0245  NA 140 139 138  K 4.0 4.6 4.3  CL 113* 112* 111  CO2 21* 21* 22  GLUCOSE 119* 160* 116*  BUN 23 36* 40*  CREATININE 1.45* 2.10* 2.05*  CALCIUM 9.2 8.9 8.7*  MG  --   --  2.4   Liver Function Tests: Recent Labs  Lab 12/27/21 0722 12/28/21 0228  AST 16 17  ALT 10 10  ALKPHOS 63 49  BILITOT 0.7 0.7  PROT  7.9 6.8  ALBUMIN 3.8 3.3*   CBG: No results for input(s): "GLUCAP" in the last 168  hours.  Discharge time spent: greater than 30 minutes.  Signed: Berle Mull, MD [facility] 12/29/2021

## 2022-01-04 ENCOUNTER — Other Ambulatory Visit: Payer: Self-pay | Admitting: Interventional Radiology

## 2022-01-04 ENCOUNTER — Other Ambulatory Visit: Payer: Self-pay | Admitting: Diagnostic Radiology

## 2022-01-04 DIAGNOSIS — N2889 Other specified disorders of kidney and ureter: Secondary | ICD-10-CM

## 2022-01-12 ENCOUNTER — Ambulatory Visit: Payer: Medicaid Other | Admitting: Family Medicine

## 2022-01-16 ENCOUNTER — Other Ambulatory Visit (HOSPITAL_COMMUNITY): Payer: Self-pay

## 2022-01-17 ENCOUNTER — Ambulatory Visit
Admission: RE | Admit: 2022-01-17 | Discharge: 2022-01-17 | Disposition: A | Discharge status: Home / Self Care | Payer: Medicaid Other | Source: Ambulatory Visit | Attending: Interventional Radiology | Admitting: Interventional Radiology

## 2022-01-17 DIAGNOSIS — N2889 Other specified disorders of kidney and ureter: Secondary | ICD-10-CM

## 2022-01-17 MED ORDER — GADOPICLENOL 0.5 MMOL/ML IV SOLN
7.5000 mL | Freq: Once | INTRAVENOUS | Status: AC | PRN
  Administered 2022-01-17: 7.5 mL via INTRAVENOUS

## 2022-01-18 ENCOUNTER — Other Ambulatory Visit (HOSPITAL_COMMUNITY): Payer: Self-pay

## 2022-01-20 ENCOUNTER — Telehealth: Payer: Self-pay

## 2022-01-20 ENCOUNTER — Encounter: Payer: Self-pay | Admitting: Family Medicine

## 2022-01-20 ENCOUNTER — Ambulatory Visit: Payer: Medicaid Other | Admitting: Family Medicine

## 2022-01-20 ENCOUNTER — Other Ambulatory Visit (HOSPITAL_COMMUNITY): Payer: Self-pay

## 2022-01-20 VITALS — BP 158/98 | HR 90 | Temp 97.8°F | Ht 72.5 in | Wt 162.4 lb

## 2022-01-20 DIAGNOSIS — M545 Low back pain, unspecified: Secondary | ICD-10-CM

## 2022-01-20 DIAGNOSIS — F1491 Cocaine use, unspecified, in remission: Secondary | ICD-10-CM

## 2022-01-20 DIAGNOSIS — Z23 Encounter for immunization: Secondary | ICD-10-CM

## 2022-01-20 DIAGNOSIS — I1A0 Resistant hypertension: Secondary | ICD-10-CM | POA: Diagnosis not present

## 2022-01-20 DIAGNOSIS — N184 Chronic kidney disease, stage 4 (severe): Secondary | ICD-10-CM | POA: Diagnosis not present

## 2022-01-20 DIAGNOSIS — G8929 Other chronic pain: Secondary | ICD-10-CM

## 2022-01-20 MED ORDER — CARVEDILOL 6.25 MG PO TABS: 6.2500 mg | ORAL_TABLET | Freq: Two times a day (BID) | ORAL | 3 refills | Status: DC

## 2022-01-20 MED ORDER — LIDOCAINE 5 % EX PTCH: 1.0000 | MEDICATED_PATCH | CUTANEOUS | 0 refills | Status: DC

## 2022-01-20 NOTE — Telephone Encounter (Signed)
Received notification from [place] medicaid regarding a prior authorization for Lidocaine 5% patches.  Authorization has been APPROVED from 01/20/22 to 01/20/23.   Authorization # PA Case ID: [id number]   Placed a call to [facility] and left a message on the provider line to let them know of the approval.

## 2022-01-20 NOTE — Progress Notes (Unsigned)
Assessment/Plan:   Problem List Items Addressed This Visit   None Visit Diagnoses     Resistant hypertension    -  Primary   Relevant Medications   carvedilol (COREG) 6.25 MG tablet   Other Relevant Orders   Refer to [facility] ([id number])   Chronic low back pain, unspecified back pain laterality, unspecified whether sciatica present       Relevant Medications   lidocaine (LIDODERM) 5 %   Other Relevant Orders   Ambulatory referral to Pain Clinic   History of cocaine use       Needs flu shot       Relevant Orders   Flu Vaccine QUAD 6+ mos PF IM (Fluarix Quad PF) (Completed)   CKD (chronic kidney disease) stage 4, GFR 15-29 ml/min (HCC)       Relevant Orders   Ambulatory referral to Nephrology      Resistant hypertension.  On multiple modalities.  With some limitation due to history of cocaine use.  Will refer to hypertension clinic for further assessment.  Increase carvedilol in the short-term.  Patient with chronic lower back pain.  Cannot take NSAIDs due to history of cardiac disease and CKD.  Referral to pain management.  Add lidocaine patches     Subjective:  HPI:  Kevin [name] is a 66 y.o. male who has Shortness of breath; PAF (paroxysmal atrial fibrillation) ([place]); Tobacco abuse; Acute on chronic renal insufficiency; COPD exacerbation ([place]); Hypertensive emergency; Hypertensive cardiovascular disease; Essential hypertension; Acute on chronic diastolic CHF (congestive heart failure), NYHA class 3 ([place]); OSA (obstructive sleep apnea); Peripheral arterial disease ([place]); Stage 3 chronic kidney disease ([place]); Claudication in peripheral vascular disease ([place]); HIV disease ([place]); Easy bruising; Coronary artery disease involving native coronary artery of native heart with unstable angina pectoris ([place]); Hypertensive urgency; Callus of foot; Dyspnea; Hypertensive crisis; Acute respiratory failure with hypoxia ([place]); 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 B/p concerns. 180/70's at home) .   Patient presents here to establish care.  Patient has complex medical history including HIV/AIDS, chronic lower back pain, GERD, COPD, hyperlipidemia, heart failure, resistant hypertension, cocaine use, CKD 4..  Resistant hypertension.  Patient with history of persistent hypertension patient been on multiple modalities including amlodipine, carvedilol, isosorbide, valsartan.  Patient says has been on multiple medications and follow-up with outpatient hypertension specialist without improvement.  Patient recently hospitalized for hypertensive urgency.  He states that he is currently taking the medications above without side effect but is still having high blood pressure at home.  Patient denies any cocaine use.  Heart failure.  Patient has a history of heart failure and follows with [facility].  He denies any chest pain or shortness of breath.  Patient has a history of recently diagnosed renal cell carcinoma.  He is currently following with  urology.  Chronic lower back pain.  Patient with a history of chronic lower back pain.  He has followed with pain management the  past.  Patient currently does not take any NSAIDs due to his renal injury states that he has gotten injections in the past and these have helped.  He is interested in following up with pain management again.  CKD 4.  Patient with a history of CKD 4.  He is followed with nephrology in the past.  He is not currently following up now.  History of cocaine use.  Patient is a history of cocaine use felt to have led to persistent hypertension, heart failure and other comorbidities including renal dysfunction.  Patient denies any history of cocaine use.  States that he did live in a "crack house" and has accidentally inhaled cocaine in the past that he does not use it.   Past Surgical History:  Procedure Laterality Date   IR RADIOLOGIST EVAL & MGMT  05/31/2021   IR RADIOLOGIST EVAL & MGMT  07/26/2021   LEFT HEART CATH AND CORONARY ANGIOGRAPHY N/A 10/23/2016   Procedure: LEFT HEART CATH AND CORONARY ANGIOGRAPHY;  Surgeon: Leonie Man, MD;  Location: [facility];  Service: Cardiovascular;  Laterality: N/A;   LOWER EXTREMITY ANGIOGRAPHY N/A 07/17/2016   Procedure: Lower Extremity Angiography;  Surgeon: Lorretta Harp, MD;  Location: [facility];  Service: Cardiovascular;  Laterality: N/A;   LOWER EXTREMITY INTERVENTION N/A 06/05/2016   Procedure: Lower Extremity Intervention;  Surgeon: Lorretta Harp, MD;  Location: [facility];  Service: Cardiovascular;  Laterality: N/A;   PERIPHERAL VASCULAR ATHERECTOMY Right 07/17/2016   Procedure: Peripheral Vascular Atherectomy;  Surgeon: Lorretta Harp, MD;  Location: [facility];  Service: Cardiovascular;  Laterality: Right;  SFA   PERIPHERAL VASCULAR INTERVENTION  06/05/2016   Procedure: Peripheral Vascular Intervention;  Surgeon: Lorretta Harp, MD;  Location: [facility];  Service: Cardiovascular;;  left SFA   RADIOLOGY WITH ANESTHESIA Right 06/29/2021   Procedure: RIGHT RENAL CRYOABLATION;  Surgeon: Markus Daft, MD;   Location: [facility];  Service: Anesthesiology;  Laterality: Right;    Outpatient Medications Prior to Visit  Medication Sig Dispense Refill   albuterol (VENTOLIN HFA) 108 (90 Base) MCG/ACT inhaler Inhale 2 puffs into the lungs every 6 (six) hours as needed for wheezing or shortness of breath. 18 g 0   amLODipine (NORVASC) 10 MG tablet Take 1 tablet (10 mg total) by mouth daily. 30 tablet 0   aspirin EC 81 MG tablet Take 1 tablet (81 mg total) by mouth daily. Swallow whole. 30 tablet 12   dolutegravir-lamiVUDine (DOVATO) 50-300 MG tablet Take 1 tablet by mouth daily. 30 tablet 11   DULERA 100-5 MCG/ACT AERO Inhale 2 puffs into the lungs in the morning and at bedtime. 13 g 0   ipratropium-albuterol (DUONEB) 0.5-2.5 (3) MG/3ML SOLN Take 3 mLs by nebulization every 6 (six) hours as needed. (Patient taking differently: Take 3 mLs by nebulization every 6 (six) hours as needed (shortness of breath).) 360 mL 1   isosorbide mononitrate (IMDUR) 60 MG 24 hr tablet Take 1 tablet (60 mg total) by mouth daily. 30 tablet 0   pantoprazole (PROTONIX) 40 MG tablet Take 1 tablet (40 mg total) by mouth daily. 30 tablet 0   predniSONE (DELTASONE) 10 MG tablet Take 3 tablets by mouth daily for 3 days, then 2 tablets daily for 3 days, then 1 tablet daily for 3  days, then stop 18 tablet 0   valsartan (DIOVAN) 80 MG tablet Take 1 tablet (80 mg total) by mouth daily. 30 tablet 0   carvedilol (COREG) 3.125 MG tablet Take 1 tablet (3.125 mg total) by mouth 2 (two) times daily. 60 tablet 0   atorvastatin (LIPITOR) 40 MG tablet Take 1 tablet (40 mg total) by mouth daily. (Patient not taking: Reported on 12/07/2021) 60 tablet 3   Multiple Vitamin (MULTIVITAMIN) tablet Take 1 tablet by mouth daily. (Patient not taking: Reported on 01/20/2022)     No facility-administered medications prior to visit.    Family History  Problem Relation Age of Onset   High blood pressure Mother    Lupus Mother     Social History    Socioeconomic History   Marital status: Significant Other    Spouse name: Not on file   Number of children: Not on file   Years of education: Not on file   Highest education level: Not on file  Occupational History   Not on file  Tobacco Use   Smoking status: Every Day    Packs/day: 0.10    Years: 42.00    Total pack years: 4.20    Types: Cigarettes, E-cigarettes    Passive exposure: Never   Smokeless tobacco: Never   Tobacco comments:    1-2 cigarettes a day; vaping  Vaping Use   Vaping Use: Every day   Substances: Nicotine, Flavoring  Substance and Sexual Activity   Alcohol use: Not Currently    Alcohol/week: 2.0 standard drinks of alcohol    Types: 2 Cans of beer per week    Comment: rare   Drug use: Not Currently    Comment: rare   Sexual activity: Not Currently    Comment: declined condoms  Other Topics Concern   Not on file  Social History Narrative   Not on file   Social Determinants of Health   Financial Resource Strain: Not on file  Food Insecurity: No Food Insecurity (12/27/2021)   Hunger Vital Sign    Worried About Running Out of Food in the Last Year: Never true    Ran Out of Food in the Last Year: Never true  Transportation Needs: No Transportation Needs (12/27/2021)   PRAPARE - Hydrologist (Medical): No    Lack of Transportation (Non-Medical): No  Physical Activity: Not on file  Stress: Not on file  Social Connections: Not on file  Intimate Partner Violence: Not At Risk (12/27/2021)   Humiliation, Afraid, Rape, and Kick questionnaire    Fear of Current or Ex-Partner: No    Emotionally Abused: No    Physically Abused: No    Sexually Abused: No                                                                                                 Objective:  Physical Exam: BP (!) 158/98 (BP Location: Left Arm, Patient Position: Sitting, Cuff Size: Large)   Pulse 90   Temp 97.8 F (36.6 C) (Temporal)   Ht 6' 0.5"  (1.842 m)  Wt 162 lb 6.4 oz (73.7 kg)   SpO2 98%   BMI 21.72 kg/m    General: No acute distress. Awake and conversant.  Eyes: Normal conjunctiva, anicteric. Round symmetric pupils.  ENT: Hearing grossly intact. No nasal discharge.  Neck: Neck is supple. No masses or thyromegaly.  Respiratory: Respirations are non-labored. No auditory wheezing.  Clear to Skin: Warm. No rashes or ulcers.  Psych: Alert and oriented. Cooperative, Appropriate mood and affect, Normal judgment.  CV: No cyanosis or JVD RRR no MRG MSK: Normal ambulation. No clubbing  Neuro: Sensation and CN II-XII grossly normal.        Alesia Banda, MD, MS

## 2022-01-20 NOTE — Telephone Encounter (Signed)
Pharmacy Patient Advocate Encounter   Received notification from [facility] that prior authorization for Lidocaine 5% patches is required/requested.    PA submitted on 01/20/2022 to [facility] medicaid via CoverMyMeds [place] [phone] Status is pending

## 2022-01-20 NOTE — Patient Instructions (Signed)
For back pain, use lidocaine patch. We are referring to pain management.  For blood pressure, we are increasing carvedilol. We are referring to the hypertension clinic.  You get the flu shot today

## 2022-01-24 ENCOUNTER — Ambulatory Visit
Admission: RE | Admit: 2022-01-24 | Discharge: 2022-01-24 | Disposition: A | Discharge status: Home / Self Care | Payer: Medicaid Other | Source: Ambulatory Visit | Attending: Diagnostic Radiology | Admitting: Diagnostic Radiology

## 2022-01-24 DIAGNOSIS — N2889 Other specified disorders of kidney and ureter: Secondary | ICD-10-CM

## 2022-01-24 NOTE — Progress Notes (Signed)
Chief Complaint: Patient was consulted remotely today (TeleHealth) for follow-up right renal cryoablation  at the request of Keland Peyton.    Referring Physician(s): Tresa Moore  History of Present Illness: Kevin [name] is a 66 y.o. male with a history of a right papillary renal cell carcinoma.  The lesion was treated with cryoablation on 06/29/2021.  Patient has complained of mild intermittent discomfort in the right flank since the procedure.  The right flank feels strange but there is not significant pain or numbness.  According to the patient, the right flank area looks normal.  He denies hematuria or dysuria.  Past medical history is significant for HIV, chronic systolic CHF, chronic kidney disease, COPD and hypertension with history of hypertensive crisis.  Patient was recently in the hospital for hypertensive urgency and was discharged on 12/29/2021.  Patient is seeing Family Practice for his blood pressure and says that his systolic blood pressures are better controlled running around 160 mmHg rather than over 200 mmHg.  He has no new complaints at this time.  Patient underwent follow-up MRI on 01/17/2022.  Past Medical History:  Diagnosis Date   AIDS (acquired immune deficiency syndrome) ([place]) 08/17/2016   Chronic diastolic CHF (congestive heart failure), NYHA class 3 (HCC) 01/2016   Chronic lower back pain    CKD (chronic kidney disease), stage III (HCC)    COPD (chronic obstructive pulmonary disease) (HCC)    Gout    "forearms, hands, ankles, feet" (06/05/2016)   Headache    "weekly" (06/05/2016)   Heart murmur    Hypertension    Hypertensive crisis 08/15/2017   OSA on CPAP    PAD (peripheral artery disease) (HCC)    PAF (paroxysmal atrial fibrillation) ([place]) 01/2016   Papillary renal cell carcinoma ([place]) 06/15/2021    Past Surgical History:  Procedure Laterality Date   IR RADIOLOGIST EVAL & MGMT  05/31/2021   IR RADIOLOGIST EVAL & MGMT  07/26/2021    LEFT HEART CATH AND CORONARY ANGIOGRAPHY N/A 10/23/2016   Procedure: LEFT HEART CATH AND CORONARY ANGIOGRAPHY;  Surgeon: Leonie Man, MD;  Location: [facility];  Service: Cardiovascular;  Laterality: N/A;   LOWER EXTREMITY ANGIOGRAPHY N/A 07/17/2016   Procedure: Lower Extremity Angiography;  Surgeon: Lorretta Harp, MD;  Location: [facility];  Service: Cardiovascular;  Laterality: N/A;   LOWER EXTREMITY INTERVENTION N/A 06/05/2016   Procedure: Lower Extremity Intervention;  Surgeon: Lorretta Harp, MD;  Location: [facility];  Service: Cardiovascular;  Laterality: N/A;   PERIPHERAL VASCULAR ATHERECTOMY Right 07/17/2016   Procedure: Peripheral Vascular Atherectomy;  Surgeon: Lorretta Harp, MD;  Location: [facility];  Service: Cardiovascular;  Laterality: Right;  SFA   PERIPHERAL VASCULAR INTERVENTION  06/05/2016   Procedure: Peripheral Vascular Intervention;  Surgeon: Lorretta Harp, MD;  Location: [facility];  Service: Cardiovascular;;  left SFA   RADIOLOGY WITH ANESTHESIA Right 06/29/2021   Procedure: RIGHT RENAL CRYOABLATION;  Surgeon: Markus Daft, MD;  Location: [facility];  Service: Anesthesiology;  Laterality: Right;    Allergies: Patient has no known allergies.  Medications: Prior to Admission medications   Medication Sig Start Date End Date Taking? Authorizing Provider  albuterol (VENTOLIN HFA) 108 (90 Base) MCG/ACT inhaler Inhale 2 puffs into the lungs every 6 (six) hours as needed for wheezing or shortness of breath. 12/29/21   [name], MD  amLODipine (NORVASC) 10 MG tablet Take 1 tablet (10 mg  total) by mouth daily. 12/30/21   [name], MD  aspirin EC 81 MG tablet Take 1 tablet (81 mg total) by mouth daily. Swallow whole. 07/17/21   Charlynne Cousins, MD  atorvastatin (LIPITOR) 40 MG tablet Take 1 tablet (40 mg total) by mouth daily. Patient not taking: Reported on 12/07/2021 07/17/21   Charlynne Cousins, MD  carvedilol  (COREG) 6.25 MG tablet Take 1 tablet (6.25 mg total) by mouth 2 (two) times daily with a meal. 01/20/22 01/20/23  Bonnita Hollow, MD  dolutegravir-lamiVUDine (DOVATO) 50-300 MG tablet Take 1 tablet by mouth daily. 08/08/21   Truman Hayward, MD  DULERA 100-5 MCG/ACT AERO Inhale 2 puffs into the lungs in the morning and at bedtime. 12/29/21   [name], MD  ipratropium-albuterol (DUONEB) 0.5-2.5 (3) MG/3ML SOLN Take 3 mLs by nebulization every 6 (six) hours as needed. Patient taking differently: Take 3 mLs by nebulization every 6 (six) hours as needed (shortness of breath). 11/12/21   Hans Eden, NP  isosorbide mononitrate (IMDUR) 60 MG 24 hr tablet Take 1 tablet (60 mg total) by mouth daily. 12/30/21   [name], MD  lidocaine (LIDODERM) 5 % Place 1 patch onto the skin daily. Remove & Discard patch within 12 hours or as directed by MD 01/20/22   Bonnita Hollow, MD  Multiple Vitamin (MULTIVITAMIN) tablet Take 1 tablet by mouth daily. Patient not taking: Reported on 01/20/2022    [provider]  pantoprazole (PROTONIX) 40 MG tablet Take 1 tablet (40 mg total) by mouth daily. 12/30/21   [name], MD  predniSONE (DELTASONE) 10 MG tablet Take 3 tablets by mouth daily for 3 days, then 2 tablets daily for 3 days, then 1 tablet daily for 3 days, then stop 12/30/21   [name], MD  valsartan (DIOVAN) 80 MG tablet Take 1 tablet (80 mg total) by mouth daily. 12/29/21 12/29/22  [name], MD     Family History  Problem Relation Age of Onset   High blood pressure Mother    Lupus Mother     Social History   Socioeconomic History   Marital status: Significant Other    Spouse name: Not on file   Number of children: Not on file   Years of education: Not on file   Highest education level: Not on file  Occupational History   Not on file  Tobacco Use   Smoking status: Every Day    Packs/day: 0.10    Years: 42.00    Total pack years: 4.20    Types:  Cigarettes, E-cigarettes    Passive exposure: Never   Smokeless tobacco: Never   Tobacco comments:    1-2 cigarettes a day; vaping  Vaping Use   Vaping Use: Every day   Substances: Nicotine, Flavoring  Substance and Sexual Activity   Alcohol use: Not Currently    Alcohol/week: 2.0 standard drinks of alcohol    Types: 2 Cans of beer per week    Comment: rare   Drug use: Not Currently    Comment: rare   Sexual activity: Not Currently    Comment: declined condoms  Other Topics Concern   Not on file  Social History Narrative   Not on file   Social Determinants of Health   Financial Resource Strain: Not on file  Food Insecurity: No Food Insecurity (12/27/2021)   Hunger Vital Sign    Worried About Charity fundraiser in  the Last Year: Never true    [place] in the Last Year: Never true  Transportation Needs: No Transportation Needs (12/27/2021)   PRAPARE - Hydrologist (Medical): No    Lack of Transportation (Non-Medical): No  Physical Activity: Not on file  Stress: Not on file  Social Connections: Not on file    Review of Systems  Constitutional: Negative.   Respiratory: Negative.    Genitourinary:  Positive for flank pain. Negative for dysuria and hematuria.      Physical Exam No direct physical exam was performed   Vital Signs: There were no vitals taken for this visit.  Imaging: MR ABDOMEN WWO CONTRAST  Result Date: 01/17/2022 CLINICAL DATA:  Follow-up cryoablation of right renal lesion. EXAM: MRI ABDOMEN WITHOUT AND WITH CONTRAST TECHNIQUE: Multiplanar multisequence MR imaging of the abdomen was performed both before and after the administration of intravenous contrast. CONTRAST:  7.5 cc of Vue[place] COMPARISON:  Multiple priors including CT July 14, 2021 and MRI May 14, 2021. FINDINGS: Lower chest: No acute abnormality. Hepatobiliary: No significant hepatic steatosis. No suspicious hepatic lesion. Gallbladder is unremarkable. No  biliary ductal dilation. Pancreas: No pancreatic ductal dilation or evidence of acute inflammation. Spleen:  No splenomegaly. Adrenals/Urinary Tract:  Bilateral adrenal glands appear normal. Cryoablation defect in the posterior interpolar left kidney measures 2.1 x 1.5 cm on image 15/4 with intrinsic T1 hyperintensity but without suspicious postcontrast enhancement on subtraction imaging. Bilateral benign Bosniak classification 1 into renal cysts including a hemorrhagic left lower pole renal cyst which are considered benign and require no independent imaging follow-up. Stomach/Bowel: Visualized portions within the abdomen are unremarkable. Vascular/Lymphatic: No pathologically enlarged lymph nodes identified. No abdominal aortic aneurysm demonstrated. Other:  None. Musculoskeletal: No suspicious bone lesions identified. IMPRESSION: Cryoablation defect in the posterior interpolar left kidney without suspicious postcontrast enhancement to suggest residual or recurrent disease. Electronically Signed   By: Dahlia Bailiff M.D.   On: 01/17/2022 18:33   ECHOCARDIOGRAM COMPLETE  Result Date: 12/27/2021    ECHOCARDIOGRAM REPORT   Patient Name:   Kevin [name] Date of Exam: 12/27/2021 Medical Rec #:  [id number]       Height:       72.0 in Accession #:    [id number]      Weight:       155.0 lb Date of Birth:  27-Feb-1955       BSA:          1.912 m Patient Age:    33 years        BP:           175/106 mmHg Patient Gender: M               HR:           67 bpm. Exam Location:  Inpatient Procedure: 2D Echo Indications:    chest pain  History:        Patient has prior history of Echocardiogram examinations, most                 recent 07/16/2021. CHF, COPD; Risk Factors:Dyslipidemia,                 Hypertension and Current Smoker.  Sonographer:    Harvie Junior Referring Phys: [id number] [place]  1. Left ventricular ejection fraction, by estimation, is 60 to 65%. The left ventricle has normal function. The left  ventricle has no regional wall motion  abnormalities. There is moderate concentric left ventricular hypertrophy. Left ventricular diastolic parameters are consistent with Grade I diastolic dysfunction (impaired relaxation).  2. Right ventricular systolic function is normal. The right ventricular size is normal. There is mildly elevated pulmonary artery systolic pressure.  3. Left atrial size was mildly dilated.  4. The mitral valve is normal in structure. No evidence of mitral valve regurgitation. No evidence of mitral stenosis.  5. The aortic valve is grossly normal. Aortic valve regurgitation is not visualized. No aortic stenosis is present.  6. The inferior vena cava is normal in size with greater than 50% respiratory variability, suggesting right atrial pressure of 3 mmHg. Comparison(s): No significant change from prior study. FINDINGS  Left Ventricle: Left ventricular ejection fraction, by estimation, is 60 to 65%. The left ventricle has normal function. The left ventricle has no regional wall motion abnormalities. The left ventricular internal cavity size was normal in size. There is  moderate concentric left ventricular hypertrophy. Left ventricular diastolic parameters are consistent with Grade I diastolic dysfunction (impaired relaxation). Right Ventricle: The right ventricular size is normal. No increase in right ventricular wall thickness. Right ventricular systolic function is normal. There is mildly elevated pulmonary artery systolic pressure. The tricuspid regurgitant velocity is 2.99  m/s, and with an assumed right atrial pressure of 3 mmHg, the estimated right ventricular systolic pressure is 33.2 mmHg. Left Atrium: Left atrial size was mildly dilated. Right Atrium: Right atrial size was normal in size. Pericardium: There is no evidence of pericardial effusion. Mitral Valve: The mitral valve is normal in structure. No evidence of mitral valve regurgitation. No evidence of mitral valve stenosis.  Tricuspid Valve: The tricuspid valve is normal in structure. Tricuspid valve regurgitation is trivial. No evidence of tricuspid stenosis. Aortic Valve: The aortic valve is grossly normal. Aortic valve regurgitation is not visualized. No aortic stenosis is present. Aortic valve mean gradient measures 4.0 mmHg. Aortic valve peak gradient measures 10.5 mmHg. Aortic valve area, by VTI measures  4.40 cm. Pulmonic Valve: The pulmonic valve was not well visualized. Pulmonic valve regurgitation is not visualized. No evidence of pulmonic stenosis. Aorta: The aortic root and ascending aorta are structurally normal, with no evidence of dilitation. Venous: The inferior vena cava is normal in size with greater than 50% respiratory variability, suggesting right atrial pressure of 3 mmHg. IAS/Shunts: The atrial septum is grossly normal.  LEFT VENTRICLE PLAX 2D LVIDd:         5.30 cm      Diastology LVIDs:         3.30 cm      LV e' medial:    4.46 cm/s LV PW:         1.20 cm      LV E/e' medial:  18.8 LV IVS:        1.20 cm      LV e' lateral:   5.33 cm/s LVOT diam:     2.40 cm      LV E/e' lateral: 15.7 LV SV:         127 LV SV Index:   66 LVOT Area:     4.52 cm  LV Volumes (MOD) LV vol d, MOD A2C: 132.0 ml LV vol d, MOD A4C: 158.0 ml LV vol s, MOD A2C: 53.1 ml LV vol s, MOD A4C: 69.6 ml LV SV MOD A2C:     78.9 ml LV SV MOD A4C:     158.0 ml LV SV MOD BP:  85.3 ml RIGHT VENTRICLE RV Basal diam:  3.40 cm RV Mid diam:    2.50 cm TAPSE (M-mode): 2.4 cm LEFT ATRIUM             Index        RIGHT ATRIUM           Index LA diam:        3.40 cm 1.78 cm/m   RA Area:     12.10 cm LA Vol (A2C):   70.7 ml 36.98 ml/m  RA Volume:   24.60 ml  12.87 ml/m LA Vol (A4C):   44.7 ml 23.38 ml/m LA Biplane Vol: 60.7 ml 31.75 ml/m  AORTIC VALVE                    PULMONIC VALVE AV Area (Vmax):    4.16 cm     PV Vmax:       1.74 m/s AV Area (Vmean):   4.05 cm     PV Peak grad:  12.1 mmHg AV Area (VTI):     4.40 cm AV Vmax:            162.00 cm/s AV Vmean:          97.400 cm/s AV VTI:            0.289 m AV Peak Grad:      10.5 mmHg AV Mean Grad:      4.0 mmHg LVOT Vmax:         149.00 cm/s LVOT Vmean:        87.300 cm/s LVOT VTI:          0.281 m LVOT/AV VTI ratio: 0.97  AORTA Ao Root diam: 3.60 cm Ao Asc diam:  3.40 cm MITRAL VALVE                TRICUSPID VALVE MV Area (PHT): 2.54 cm     TR Peak grad:   35.8 mmHg MV Decel Time: 299 msec     TR Vmax:        299.00 cm/s MV E velocity: 83.90 cm/s MV A velocity: 121.00 cm/s  SHUNTS MV E/A ratio:  0.69         Systemic VTI:  0.28 m                             Systemic Diam: 2.40 cm Buford Dresser MD Electronically signed by Buford Dresser MD Signature Date/Time: 12/27/2021/5:21:49 PM    Final    DG Chest 2 View  Result Date: 12/27/2021 CLINICAL DATA:  Shortness of breath and chest pain. EXAM: CHEST - 2 VIEW COMPARISON:  PA Lat 09/03/2021 FINDINGS: The heart size and mediastinal contours are within normal limits. Both lungs are hyperinflated but clear. The visualized skeletal structures are unremarkable. IMPRESSION: 1. No acute radiographic chest findings. 2. Stable COPD chest. Electronically Signed   By: Telford Nab M.D.   On: 12/27/2021 03:52    Labs:  CBC: Recent Labs    12/07/21 1401 12/27/21 0722 12/28/21 0228 12/29/21 0245  WBC 9.2 10.2 17.0* 23.8*  HGB 12.8* 12.3* 11.0* 10.9*  HCT 37.5* 35.9* 32.0* 32.9*  PLT 340 273 262 262    COAGS: Recent Labs    06/21/21 1442 07/14/21 0245  INR 1.1 1.2  APTT  --  33    BMP: Recent Labs    09/03/21 2200 12/07/21 1401 12/27/21 0722 12/28/21 0228 12/29/21 0245  NA 139 141 140 139 138  K 3.3* 4.2 4.0 4.6 4.3  CL 117* 109 113* 112* 111  CO2 17* 26 21* 21* 22  GLUCOSE 83 81 119* 160* 116*  BUN '18 22 23 '$ 36* 40*  CALCIUM 7.6* 9.0 9.2 8.9 8.7*  CREATININE 1.79* 2.41* 1.45* 2.10* 2.05*  GFRNONAA 42*  --  54* 35* 36*    LIVER FUNCTION TESTS: Recent Labs    07/14/21 0703 09/03/21 2200  12/07/21 1401 12/27/21 0722 12/28/21 0228  BILITOT 0.7 0.2* 0.5 0.7 0.7  AST 23 14* '10 16 17  '$ ALT 14 11 8* 10 10  ALKPHOS 64 46  --  63 49  PROT 7.9 6.1* 7.1 7.9 6.8  ALBUMIN 3.5 3.0*  --  3.8 3.3*    TUMOR MARKERS: No results for input(s): "AFPTM", "CEA", "CA199", "CHROMGRNA" in the last 8760 hours.  Assessment and Plan:  66 year old male with history of papillary right renal cell carcinoma that was treated with image guided cryoablation on 06/29/2021.  Patient has done well following the cryoablation except for mild intermittent discomfort in the right flank.  I personally reviewed the abdominal MRI from 01/17/2022.  The report states that the treated lesion was on the left side but the lesion was actually on the right side.  I agree that there is no clear evidence for residual or recurrent disease in the right kidney.  No new suspicious renal lesions. I discussed the MRI findings with the patient and we also talked about his overall medical health.  I encouraged him to be diligent about managing his blood pressure especially since he has underlying kidney disease.  With regards to the treated right renal cell carcinoma, we will continue with surveillance at this time.  Plan for follow-up MRI of the abdomen, with and without contrast, in 12 months.  Patient can contact us if he has any questions or concerns in the interim.    Electronically Signed: Burman Riis 01/24/2022, 9:41 AM   I spent a total of    5 Minutes in remote  clinical consultation, greater than 50% of which was counseling/coordinating care for follow-up for renal cell carcinoma.    Visit type: Audio only (telephone). Audio (no video) only due to technique limitations.. Alternative for in-person consultation at [facility] [address], [place], [place]. This visit type was conducted due to national recommendations for restrictions regarding the COVID-19 Pandemic (e.g. social distancing).  This format is felt  to be most appropriate for this patient at this time.  All issues noted in this document were discussed and addressed.  Patient ID: Kevin [name], male   DOB: 09/06/1955, 66 y.o.   MRN: [id number]

## 2022-01-31 ENCOUNTER — Other Ambulatory Visit: Payer: Self-pay

## 2022-02-01 ENCOUNTER — Other Ambulatory Visit: Payer: Self-pay

## 2022-02-21 ENCOUNTER — Encounter: Payer: Self-pay | Admitting: Family Medicine

## 2022-02-23 ENCOUNTER — Other Ambulatory Visit (HOSPITAL_COMMUNITY): Payer: Self-pay

## 2022-02-27 ENCOUNTER — Other Ambulatory Visit (HOSPITAL_COMMUNITY): Payer: Self-pay

## 2022-03-07 ENCOUNTER — Ambulatory Visit (HOSPITAL_BASED_OUTPATIENT_CLINIC_OR_DEPARTMENT_OTHER): Payer: Medicaid Other | Admitting: Cardiovascular Disease

## 2022-03-07 NOTE — Progress Notes (Incomplete)
Advanced Hypertension Clinic Initial Assessment:    Date:  03/07/2022   ID:  BURDETTE FOREHAND, DOB January 03, 1956, MRN [id number]  PCP:  Bonnita Hollow, MD  Cardiologist:  [name], MD  Nephrologist:  Referring MD: Bonnita Hollow, MD   CC: Hypertension  History of Present Illness:    CHRLES SELLEY is a 67 y.o. male with a hx of hypertension, PAF, chronic diastolic heart failure, CAD, COPD, OSA, HIV, CKD III, here to establish care in the Advanced Hypertension Clinic. He was seen in the hospital 07/2021 with chest pain. In the hospital his blood pressure was controlled on amlodipine, irbesartan, and bidil. He was admitted 12/2021 with hypertensive emergency requiring a nicardipine drip. Urine toxicology was positive for cocaine though he denied substance abuse. He had chest pain that was reproducible with palpation. Troponin was minimally elevated and flat. Echo revealed LVEF 60-65% with moderate LVH and grade 1 diastolic dysfunction.  Today,  He denies any palpitations, chest pain, shortness of breath, or peripheral edema. No lightheadedness, headaches, syncope, orthopnea, or PND.  (+)  Previous antihypertensives:   Past Medical History:  Diagnosis Date   AIDS (acquired immune deficiency syndrome) ([place]) 08/17/2016   Chronic diastolic CHF (congestive heart failure), NYHA class 3 ([place]) 01/2016   Chronic lower back pain    CKD (chronic kidney disease), stage III (HCC)    COPD (chronic obstructive pulmonary disease) (HCC)    Gout    "forearms, hands, ankles, feet" (06/05/2016)   Headache    "weekly" (06/05/2016)   Heart murmur    Hypertension    Hypertensive crisis 08/15/2017   OSA on CPAP    PAD (peripheral artery disease) (HCC)    PAF (paroxysmal atrial fibrillation) ([place]) 01/2016   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    Past Surgical History:  Procedure Laterality Date   IR RADIOLOGIST EVAL & MGMT  05/31/2021   IR RADIOLOGIST EVAL & MGMT  07/26/2021    LEFT HEART CATH AND CORONARY ANGIOGRAPHY N/A 10/23/2016   Procedure: LEFT HEART CATH AND CORONARY ANGIOGRAPHY;  Surgeon: Leonie Man, MD;  Location: [facility];  Service: Cardiovascular;  Laterality: N/A;   LOWER EXTREMITY ANGIOGRAPHY N/A 07/17/2016   Procedure: Lower Extremity Angiography;  Surgeon: Lorretta Harp, MD;  Location: [facility];  Service: Cardiovascular;  Laterality: N/A;   LOWER EXTREMITY INTERVENTION N/A 06/05/2016   Procedure: Lower Extremity Intervention;  Surgeon: Lorretta Harp, MD;  Location: [facility];  Service: Cardiovascular;  Laterality: N/A;   PERIPHERAL VASCULAR ATHERECTOMY Right 07/17/2016   Procedure: Peripheral Vascular Atherectomy;  Surgeon: Lorretta Harp, MD;  Location: [facility];  Service: Cardiovascular;  Laterality: Right;  SFA   PERIPHERAL VASCULAR INTERVENTION  06/05/2016   Procedure: Peripheral Vascular Intervention;  Surgeon: Lorretta Harp, MD;  Location: [facility];  Service: Cardiovascular;;  left SFA   RADIOLOGY WITH ANESTHESIA Right 06/29/2021   Procedure: RIGHT RENAL CRYOABLATION;  Surgeon: Markus Daft, MD;  Location: [facility];  Service: Anesthesiology;  Laterality: Right;    Current Medications: No outpatient medications have been marked as taking for the 03/07/22 encounter (Appointment) with Skeet Latch, MD.     Allergies:   Patient has no known allergies.   Social History   Socioeconomic History   Marital status: Significant Other    Spouse name: Not on file   Number of children: Not on file   Years of education: Not on  file   Highest education level: Not on file  Occupational History   Not on file  Tobacco Use   Smoking status: Every Day    Packs/day: 0.10    Years: 42.00    Total pack years: 4.20    Types: Cigarettes, E-cigarettes    Passive exposure: Never   Smokeless tobacco: Never   Tobacco comments:    1-2 cigarettes a day; vaping  Vaping Use   Vaping Use: Every day    Substances: Nicotine, Flavoring  Substance and Sexual Activity   Alcohol use: Not Currently    Alcohol/week: 2.0 standard drinks of alcohol    Types: 2 Cans of beer per week    Comment: rare   Drug use: Not Currently    Comment: rare   Sexual activity: Not Currently    Comment: declined condoms  Other Topics Concern   Not on file  Social History Narrative   Not on file   Social Determinants of Health   Financial Resource Strain: Not on file  Food Insecurity: No Food Insecurity (12/27/2021)   Hunger Vital Sign    Worried About Running Out of Food in the Last Year: Never true    Ran Out of Food in the Last Year: Never true  Transportation Needs: No Transportation Needs (12/27/2021)   PRAPARE - Hydrologist (Medical): No    Lack of Transportation (Non-Medical): No  Physical Activity: Not on file  Stress: Not on file  Social Connections: Not on file     Family History: The patient's family history includes High blood pressure in his mother; Lupus in his mother.  ROS:   Please see the history of present illness.     All other systems reviewed and are negative.  EKGs/Labs/Other Studies Reviewed:    Echo  12/27/2021:  1. Left ventricular ejection fraction, by estimation, is 60 to 65%. The  left ventricle has normal function. The left ventricle has no regional  wall motion abnormalities. There is moderate concentric left ventricular  hypertrophy. Left ventricular  diastolic parameters are consistent with Grade I diastolic dysfunction  (impaired relaxation).   2. Right ventricular systolic function is normal. The right ventricular  size is normal. There is mildly elevated pulmonary artery systolic  pressure.   3. Left atrial size was mildly dilated.   4. The mitral valve is normal in structure. No evidence of mitral valve  regurgitation. No evidence of mitral stenosis.   5. The aortic valve is grossly normal. Aortic valve regurgitation is not   visualized. No aortic stenosis is present.   6. The inferior vena cava is normal in size with greater than 50%  respiratory variability, suggesting right atrial pressure of 3 mmHg.   Comparison(s): No significant change from prior study.   Stress Myoview  04/04/2019: IMPRESSION: 1. Large fixed inferior wall defect extending to the apex. Apical component appears larger on stress imaging suspicious for small amount of apical peri-infarct inducible ischemia.   2. Septal hypokinesis.   3. Left ventricular ejection fraction 50%   4. Non invasive risk stratification*: Intermediate  Left Heart Cath  10/23/2016: Angiographically normal coronary arteries. Left dominant system Normal LV function with mildly elevated LVEDP The left ventricular ejection fraction is 55-65% by visual estimate.   With normal LV function, LVEDP and normal coronaries, would recommend evaluating for noncardiac etiology for dyspnea.  EKG:  EKG is personally reviewed. 03/07/2022: Sinus ***. Rate *** bpm.  Recent Labs: 07/14/2021: TSH 0.511  12/27/2021: B Natriuretic Peptide 83.3 12/28/2021: ALT 10 12/29/2021: BUN 40; Creatinine, Ser 2.05; Hemoglobin 10.9; Magnesium 2.4; Platelets 262; Potassium 4.3; Sodium 138   Recent Lipid Panel    Component Value Date/Time   CHOL 220 (H) 12/07/2021 1401   TRIG 117 12/07/2021 1401   HDL 68 12/07/2021 1401   CHOLHDL 3.2 12/07/2021 1401   VLDL 41 (H) 04/04/2019 0458   LDLCALC 130 (H) 12/07/2021 1401    Physical Exam:    VS:  There were no vitals taken for this visit. , BMI There is no height or weight on file to calculate BMI. GENERAL:  Well appearing HEENT: Pupils equal round and reactive, fundi not visualized, oral mucosa unremarkable NECK:  No jugular venous distention, waveform within normal limits, carotid upstroke brisk and symmetric, no bruits, no thyromegaly LYMPHATICS:  No cervical adenopathy LUNGS:  Clear to auscultation bilaterally HEART:  RRR.  PMI not  displaced or sustained,S1 and S2 within normal limits, no S3, no S4, no clicks, no rubs, *** murmurs ABD:  Flat, positive bowel sounds normal in frequency in pitch, no bruits, no rebound, no guarding, no midline pulsatile mass, no hepatomegaly, no splenomegaly EXT:  2 plus pulses throughout, no edema, no cyanosis, no clubbing SKIN:  No rashes, no nodules NEURO:  Cranial nerves II through XII grossly intact, motor grossly intact throughout PSYCH:  Cognitively intact, oriented to person place and time   ASSESSMENT/PLAN:    No problem-specific Assessment & Plan notes found for this encounter.  ***Plan: -  Screening for Secondary Hypertension: { Click here to document screening for secondary causes of HTN  :[id number]}    Relevant Labs/Studies:    Latest Ref Rng & Units 12/29/2021    2:45 AM 12/28/2021    2:28 AM 12/27/2021    7:22 AM  Basic Labs  Sodium 135 - 145 mmol/L 138  139  140   Potassium 3.5 - 5.1 mmol/L 4.3  4.6  4.0   Creatinine 0.61 - 1.24 mg/dL 2.05  2.10  1.45        Latest Ref Rng & Units 07/14/2021    8:41 AM 11/02/2020   10:45 AM  Thyroid   TSH 0.350 - 4.500 uIU/mL 0.511  1.17                     he consents to be monitored in our remote patient monitoring program through [facility].  he will track his blood pressure twice daily and understands that these trends will help [name] to adjust his medications as needed prior to his next appointment.  he *** interested in enrolling in the PREP exercise and nutrition program through the [facility].     Disposition:   *** FU with APP/PharmD in 1 month for the next 3 months.   FU with Tiffany C. Oval Linsey, MD, [facility] in 4 months.   Medication Adjustments/Labs and Tests Ordered: Current medicines are reviewed at length with the patient today.  Concerns regarding medicines are outlined above.   No orders of the defined types were placed in this encounter.  No orders of the defined types were placed in this  encounter.   I,Mathew Stumpf,acting as a Education administrator for Skeet Latch, MD.,have documented all relevant documentation on the behalf of Skeet Latch, MD,as directed by  Skeet Latch, MD while in the presence of Skeet Latch, MD.  ***  Signed, Madelin Rear  03/07/2022 2:03 PM    [place]

## 2022-03-13 ENCOUNTER — Encounter: Payer: Self-pay | Admitting: Physical Therapy

## 2022-03-13 ENCOUNTER — Ambulatory Visit: Payer: Medicaid Other | Attending: Nurse Practitioner | Admitting: Physical Therapy

## 2022-03-13 DIAGNOSIS — M2560 Stiffness of unspecified joint, not elsewhere classified: Secondary | ICD-10-CM | POA: Diagnosis present

## 2022-03-13 DIAGNOSIS — M5459 Other low back pain: Secondary | ICD-10-CM | POA: Diagnosis present

## 2022-03-13 NOTE — Therapy (Addendum)
OUTPATIENT PHYSICAL THERAPY THORACOLUMBAR EVALUATION/ DISCHARGE   Patient Name: Kevin [name] MRN: [id number] DOB:07/07/1955, 67 y.o., male Today's Date: 03/13/2022  END OF SESSION:  PT End of Session - 03/13/22 1340     Visit Number 1    Number of Visits 12    Date for PT Re-Evaluation 04/24/22    Authorization Type [employer]    PT Start Time 1335    PT Stop Time 1415    PT Time Calculation (min) 40 min    Activity Tolerance Patient tolerated treatment well    Behavior During Therapy WFL for tasks assessed/performed             Past Medical History:  Diagnosis Date   AIDS (acquired immune deficiency syndrome) ([place]) 08/17/2016   Chronic diastolic CHF (congestive heart failure), NYHA class 3 ([place]) 01/2016   Chronic lower back pain    CKD (chronic kidney disease), stage III (HCC)    COPD (chronic obstructive pulmonary disease) ([place])    Gout    "forearms, hands, ankles, feet" (06/05/2016)   Headache    "weekly" (06/05/2016)   Heart murmur    Hypertension    Hypertensive crisis 08/15/2017   OSA on CPAP    PAD (peripheral artery disease) (HCC)    PAF (paroxysmal atrial fibrillation) ([place]) 01/2016   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   Past Surgical History:  Procedure Laterality Date   IR RADIOLOGIST EVAL & MGMT  05/31/2021   IR RADIOLOGIST EVAL & MGMT  07/26/2021   LEFT HEART CATH AND CORONARY ANGIOGRAPHY N/A 10/23/2016   Procedure: LEFT HEART CATH AND CORONARY ANGIOGRAPHY;  Surgeon: Leonie Man, MD;  Location: [facility];  Service: Cardiovascular;  Laterality: N/A;   LOWER EXTREMITY ANGIOGRAPHY N/A 07/17/2016   Procedure: Lower Extremity Angiography;  Surgeon: Lorretta Harp, MD;  Location: [facility];  Service: Cardiovascular;  Laterality: N/A;   LOWER EXTREMITY INTERVENTION N/A 06/05/2016   Procedure: Lower Extremity Intervention;  Surgeon: Lorretta Harp, MD;  Location: [facility];  Service: Cardiovascular;  Laterality:  N/A;   PERIPHERAL VASCULAR ATHERECTOMY Right 07/17/2016   Procedure: Peripheral Vascular Atherectomy;  Surgeon: Lorretta Harp, MD;  Location: [facility];  Service: Cardiovascular;  Laterality: Right;  SFA   PERIPHERAL VASCULAR INTERVENTION  06/05/2016   Procedure: Peripheral Vascular Intervention;  Surgeon: Lorretta Harp, MD;  Location: [facility];  Service: Cardiovascular;;  left SFA   RADIOLOGY WITH ANESTHESIA Right 06/29/2021   Procedure: RIGHT RENAL CRYOABLATION;  Surgeon: Markus Daft, MD;  [facility];  Service: Anesthesiology;  Laterality: Right;   Patient Active Problem List   Diagnosis Date Noted   Chronic heart failure with preserved ejection fraction (HFpEF) ([place]) 12/27/2021   HLD (hyperlipidemia) 12/27/2021   Chest pain 12/27/2021   Elevated troponin 40/98/1191   Acute diastolic heart failure ([place]) 07/14/2021   Right renal mass 06/29/2021   Renal lesion 06/29/2021   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   Hypertensive crisis    Acute respiratory failure with hypoxia (HCC)    Dyspnea 04/04/2019   Callus of foot 07/10/2018   Hypertensive urgency 08/15/2017   Coronary artery disease involving native coronary artery of native heart with unstable angina pectoris ([place]) 10/19/2016   Easy bruising 08/11/2016   Claudication in peripheral vascular disease ([place]) 06/05/2016   Stage 3 chronic kidney disease ([place])    Peripheral arterial disease ([place]) 05/09/2016   Acute on  chronic diastolic CHF (congestive heart failure), NYHA class 3 ([place]) 03/24/2016   OSA (obstructive sleep apnea) 03/24/2016   Essential hypertension 02/18/2016   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   Shortness of breath 02/07/2016   PAF (paroxysmal atrial fibrillation) ([place]) 02/07/2016   Tobacco abuse 02/07/2016   Acute on chronic renal insufficiency 02/07/2016   COPD exacerbation ([place]) 02/07/2016   Hypertensive emergency 02/07/2016   HIV disease ([place]) 08/27/2004     PCP: Josephine Igo MD   REFERRING PROVIDER: Jorene Guest FNP  REFERRING DIAG:  Diagnosis  M54.50 (ICD-10-CM) - Low back pain, unspecified    Rationale for Evaluation and Treatment: Rehabilitation  THERAPY DIAG:  Other low back pain  ONSET DATE: chronic 7-8 yrs   SUBJECTIVE:                                                                                                                                                                                          SUBJECTIVE STATEMENT: My hardest thing is sleep.  I have pain constantly.  I have more time to notice it because I am not working. If I'm distracted its ok.  The pain does not radiate.  Every now and then my legs give out.  I get out of breath easy.  He reports a long history of physical labor and heavy lifting. He has a 29 yr old daughter and he can't really do much with her.   PERTINENT HISTORY:  Vascular surgery,COPD, A -fib HTN, CHF   PAIN:  Are you having pain? Yes: NPRS scale: 6/10 Pain location: either R or L  Pain description: headache type pain  Aggravating factors: standing Relieving factors: leaning forward, changing position   PRECAUTIONS: None  WEIGHT BEARING RESTRICTIONS: No  FALLS:  Has patient fallen in last 6 months? 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  Sore in Quadratus lumborum Stiff with palpation to all segments in thoracolumbar spine   LUMBAR ROM:  AROM eval  Flexion 50% hands to upper shin   Extension 75% limited   Right lateral flexion 75%  Left lateral flexion 75%  Right rotation 25%  Left rotation 50%    (Blank rows = not tested)  LOWER EXTREMITY MMT:    MMT Right eval Left eval  Hip flexion 4- 4-  Hip extension 4- 4-  Hip abduction NT  NT   Hip adduction    Hip internal rotation    Hip external rotation    Knee flexion 5 4  Knee extension 5 4+  Ankle dorsiflexion    Ankle plantarflexion    Ankle inversion    Ankle eversion     (Blank rows = not tested)  LUMBAR SPECIAL TESTS:  Straight leg raise test: Negative  FUNCTIONAL TESTS:  5 times sit to stand: 16 sec   GAIT: Distance walked: 100 feet  Assistive device utilized: None Level of assistance: Modified independence Comments: slower pace   TODAY'S TREATMENT:                                                                                                                              DATE: 03/13/22   PT eval, establish POC, HEP Hamstring stretch, seated x 2  LTR  x 10  Knee to chest  x 2    PATIENT EDUCATION:  Education details: Pt/HEP, POC  Person educated: Patient Education method: Explanation, Demonstration, and Handouts Education comprehension: verbalized understanding and needs further education  HOME EXERCISE PROGRAM: Access Code: [email or web address] URL: [email or web address] Date: 03/13/2022 Prepared by: Raeford Razor  Exercises - Supine Lower Trunk Rotation  - 1-2 x daily - 7 x weekly - 1 sets - 10 reps - 10 hold - Supine Single Knee to Chest Stretch  - 1-2 x daily - 7 x weekly - 1 sets - 10 reps - 30 hold - Seated Hamstring Stretch  - 1 x daily - 7 x weekly - 1 sets - 5 reps - 30 hold  ASSESSMENT:  CLINICAL IMPRESSION: Patient is a 67 y.o. male who was seen today for physical therapy  evaluation and treatment for chronic low back pain.  Patient has had this pain for several years and it is becoming increasingly more difficult for him to stand, walk.  He is unable to work and has difficulty with recreation activities due to back pain.  Presentation consistent with lumbar spondylosis with a flexion preference.  He has not had recent imaging.  OBJECTIVE IMPAIRMENTS: cardiopulmonary status limiting activity, decreased activity tolerance, decreased mobility, difficulty walking, decreased ROM, decreased strength, hypomobility, increased fascial restrictions, impaired flexibility, impaired UE functional use, and pain.   ACTIVITY LIMITATIONS: carrying, lifting, sitting, standing, squatting, sleeping, and locomotion level  PARTICIPATION LIMITATIONS: shopping, community activity, occupation, and yard work  PERSONAL FACTORS: Social background, Time since onset of injury/illness/exacerbation, and 3+ comorbidities: HTN, cardiac/renal disease, COPD  are also affecting patient's functional outcome.   REHAB POTENTIAL: Good  CLINICAL  DECISION MAKING: Stable/uncomplicated  EVALUATION COMPLEXITY: Low   GOALS: Goals reviewed with patient? Yes  LONG TERM GOALS: Target date: 04/24/2022    Patient will be able to show independence with home exercise program Baseline: Unknown Goal status: INITIAL  2.  Patient will be able to sleep in his bed for improved sleep hygiene and quality of life Baseline: Sleeps at the kitchen table with his head in his hands Goal status: INITIAL  3.  Patient will be able to stand for light home tasks for 15 minutes with no more than minimal pain in lumbar spine Baseline: Pain moderate to severe Goal status: INITIAL  4.  Patient will increase ODI score by 8 point as a proxy for improved functional status Baseline: 26/50 Goal status: INITIAL  5.  Patient will show proper body mechanics with lifting in order to minimize strain on back Baseline: Needs  reinforcement Goal status: INITIAL  6.  Patient will be able to feel more confident in his movement so that he can spend time with his daughter outside of the home Baseline: Limited due to fear and severe pain Goal status: INITIAL  PLAN:  PT FREQUENCY: 2x/week  PT DURATION: 6 weeks  PLANNED INTERVENTIONS: Therapeutic exercises, Therapeutic activity, Neuromuscular re-education, Balance training, Gait training, Patient/Family education, Self Care, Joint mobilization, Spinal manipulation, Spinal mobilization, Cryotherapy, Moist heat, Manual therapy, and Re-evaluation.  PLAN FOR NEXT SESSION: check HEP, NuStep, begin light core and spine , hip mobility    Coda Filler, PT 03/13/2022, 1:41 PM    Check all possible CPT codes: 06237 - PT Re-evaluation, 97110- Therapeutic Exercise, 97140 - Manual Therapy, 97530 - Therapeutic Activities, 97535 - Self Care, [phone] - Electrical stimulation (Manual), and 97750 - Physical performance training    Check all conditions that are expected to impact treatment: Respiratory disorders and Musculoskeletal disorders   If treatment provided at initial evaluation, no treatment charged due to lack of authorization.    Raeford Razor, PT 03/13/22 3:09 PM Phone: [phone] Fax: [phone]      PHYSICAL THERAPY DISCHARGE SUMMARY  Visits from Start of Care: 1  Current functional level related to goals / functional outcomes: NA   Remaining deficits: NA   Education / Equipment: NA   Patient agrees to discharge. Patient goals were not met. Patient is being discharged due to not returning since the last visit.  Raeford Razor, PT 05/03/22 12:03 PM Phone: [phone] Fax: [phone]

## 2022-03-16 ENCOUNTER — Other Ambulatory Visit (HOSPITAL_COMMUNITY): Payer: Self-pay

## 2022-03-22 ENCOUNTER — Ambulatory Visit: Payer: Medicaid Other | Admitting: Physical Therapy

## 2022-03-24 ENCOUNTER — Ambulatory Visit: Payer: Medicaid Other | Admitting: Physical Therapy

## 2022-03-28 ENCOUNTER — Other Ambulatory Visit: Payer: Self-pay | Admitting: Nurse Practitioner

## 2022-03-28 ENCOUNTER — Ambulatory Visit
Admission: RE | Admit: 2022-03-28 | Discharge: 2022-03-28 | Disposition: A | Discharge status: Home / Self Care | Payer: Medicaid Other | Source: Ambulatory Visit | Attending: Nurse Practitioner | Admitting: Nurse Practitioner

## 2022-03-28 DIAGNOSIS — M545 Low back pain, unspecified: Secondary | ICD-10-CM

## 2022-03-29 ENCOUNTER — Telehealth: Payer: Self-pay | Admitting: Physical Therapy

## 2022-03-29 ENCOUNTER — Ambulatory Visit: Payer: Medicaid Other | Attending: Nurse Practitioner | Admitting: Physical Therapy

## 2022-03-29 ENCOUNTER — Encounter: Payer: Self-pay | Admitting: Physical Therapy

## 2022-03-29 NOTE — Telephone Encounter (Signed)
Called patient regarding his missed appt today.  Reinforced the attendance policy and reminded him of his next appt 03/30/22 at 1:30.  Raeford Razor, PT 03/29/22 2:22 PM Phone: [phone] Fax: [phone]

## 2022-03-30 ENCOUNTER — Encounter: Payer: Self-pay | Admitting: Cardiovascular Disease

## 2022-03-30 ENCOUNTER — Ambulatory Visit: Payer: Medicaid Other | Admitting: Physical Therapy

## 2022-03-30 ENCOUNTER — Telehealth: Payer: Self-pay | Admitting: Physical Therapy

## 2022-03-30 NOTE — Telephone Encounter (Signed)
Contacted patient after 2 missed appointments. He report that he is still under the weather. He wants to call when he feels better. He was reminded that after 30 days from first appointment, he would need new referral. He verbalized understanding.

## 2022-04-04 ENCOUNTER — Ambulatory Visit: Payer: Medicaid Other | Admitting: Physical Therapy

## 2022-04-12 ENCOUNTER — Ambulatory Visit: Payer: Medicaid Other | Admitting: Infectious Disease

## 2022-04-13 ENCOUNTER — Other Ambulatory Visit (HOSPITAL_COMMUNITY): Payer: Self-pay

## 2022-04-17 ENCOUNTER — Other Ambulatory Visit (HOSPITAL_COMMUNITY): Payer: Self-pay

## 2022-04-25 ENCOUNTER — Telehealth: Payer: Self-pay | Admitting: Family Medicine

## 2022-04-25 ENCOUNTER — Ambulatory Visit: Payer: Medicaid Other | Admitting: Family Medicine

## 2022-04-25 NOTE — Telephone Encounter (Signed)
I send not send a letter.

## 2022-04-25 NOTE — Telephone Encounter (Signed)
Fee waived due to pt not having transportation. Pt has 2 same day cancellations for transportation. This is not noted on last SDOH from 12/27/21.  No letter sent.

## 2022-04-25 NOTE — Telephone Encounter (Signed)
Pt was a no show with Dr Grandville Silos for an OV on 04/25/22, I sent a letter. He called around 1:10pm on 04/25/22 saying he didn't have a ride up here, because of a death in the family. He did schedule an app for 04/28/22, but was still going to try to make it to his 04/25/22 appointment.

## 2022-04-27 ENCOUNTER — Other Ambulatory Visit (HOSPITAL_COMMUNITY): Payer: Self-pay

## 2022-04-28 ENCOUNTER — Other Ambulatory Visit: Payer: Self-pay | Admitting: Family Medicine

## 2022-04-28 ENCOUNTER — Encounter: Payer: Self-pay | Admitting: Family Medicine

## 2022-04-28 ENCOUNTER — Ambulatory Visit: Payer: Medicaid Other | Admitting: Family Medicine

## 2022-04-28 VITALS — BP 150/88 | HR 77 | Temp 97.4°F | Wt 158.0 lb

## 2022-04-28 DIAGNOSIS — G8929 Other chronic pain: Secondary | ICD-10-CM

## 2022-04-28 DIAGNOSIS — I739 Peripheral vascular disease, unspecified: Secondary | ICD-10-CM

## 2022-04-28 DIAGNOSIS — N1832 Chronic kidney disease, stage 3b: Secondary | ICD-10-CM

## 2022-04-28 DIAGNOSIS — I10 Essential (primary) hypertension: Secondary | ICD-10-CM

## 2022-04-28 DIAGNOSIS — I1A0 Resistant hypertension: Secondary | ICD-10-CM

## 2022-04-28 DIAGNOSIS — M545 Low back pain, unspecified: Secondary | ICD-10-CM

## 2022-04-28 DIAGNOSIS — H5213 Myopia, bilateral: Secondary | ICD-10-CM | POA: Diagnosis not present

## 2022-04-28 DIAGNOSIS — J441 Chronic obstructive pulmonary disease with (acute) exacerbation: Secondary | ICD-10-CM

## 2022-04-28 DIAGNOSIS — Z720 Tobacco use: Secondary | ICD-10-CM | POA: Diagnosis not present

## 2022-04-28 DIAGNOSIS — Z599 Problem related to housing and economic circumstances, unspecified: Secondary | ICD-10-CM

## 2022-04-28 MED ORDER — VALSARTAN 160 MG PO TABS: 160.0000 mg | ORAL_TABLET | Freq: Every day | ORAL | 0 refills | Status: DC

## 2022-04-28 MED ORDER — ATORVASTATIN CALCIUM 40 MG PO TABS: 40.0000 mg | ORAL_TABLET | Freq: Every day | ORAL | 3 refills | Status: DC

## 2022-04-28 MED ORDER — ASPIRIN 81 MG PO TBEC: 81.0000 mg | DELAYED_RELEASE_TABLET | Freq: Every day | ORAL | 12 refills | Status: DC

## 2022-04-28 MED ORDER — CARVEDILOL 12.5 MG PO TABS: 12.5000 mg | ORAL_TABLET | Freq: Two times a day (BID) | ORAL | 3 refills | Status: DC

## 2022-04-28 NOTE — Patient Instructions (Signed)
We refilled your Dulera, carvedilol and valsartan.  Please continue take your other medications as ordered as well.  We are ordering basic blood work.

## 2022-04-28 NOTE — Progress Notes (Unsigned)
Assessment/Plan:   Problem List Items Addressed This Visit       Cardiovascular and Mediastinum   Peripheral arterial disease (HCC)   Relevant Medications   valsartan (DIOVAN) 160 MG tablet   carvedilol (COREG) 12.5 MG tablet   atorvastatin (LIPITOR) 40 MG tablet   aspirin EC 81 MG tablet     Respiratory   COPD exacerbation (HCC)   Relevant Orders   AMB Referral to Managed Medicaid Care Management     Genitourinary   Stage 3 chronic kidney disease ([place])     Other   Tobacco abuse   Other Visit Diagnoses     Myopia of both eyes    -  Primary   Relevant Orders   Ambulatory referral to Ophthalmology   Resistant hypertension       Relevant Medications   valsartan (DIOVAN) 160 MG tablet   carvedilol (COREG) 12.5 MG tablet   atorvastatin (LIPITOR) 40 MG tablet   aspirin EC 81 MG tablet   Other Relevant Orders   Ambulatory referral to Ophthalmology   AMB Referral to Managed Medicaid Care Management   TSH   Lipid panel   Hemoglobin A1c   Microalbumin / creatinine urine ratio   Urinalysis, Routine w reflex microscopic   Vitamin D 1,25 dihydroxy   CBC with Differential/Platelet   Comprehensive metabolic panel   Magnesium   Financial difficulties       Relevant Orders   AMB Referral to Managed Medicaid Care Management       Medications Discontinued During This Encounter  Medication Reason   predniSONE (DELTASONE) 10 MG tablet    pantoprazole (PROTONIX) 40 MG tablet    Multiple Vitamin (MULTIVITAMIN) tablet    lidocaine (LIDODERM) 5 %    isosorbide mononitrate (IMDUR) 60 MG 24 hr tablet    amLODipine (NORVASC) 10 MG tablet    aspirin EC 81 MG tablet Reorder   atorvastatin (LIPITOR) 40 MG tablet Reorder   valsartan (DIOVAN) 80 MG tablet Reorder   carvedilol (COREG) 6.25 MG tablet Reorder      Subjective:  HPI: Encounter date: 04/28/2022  Kevin [name] is a 67 y.o. male who has Shortness of breath; PAF (paroxysmal atrial fibrillation) ([place]); Tobacco  abuse; Acute on chronic renal insufficiency; COPD exacerbation ([place]); Hypertensive emergency; Hypertensive cardiovascular disease; Essential hypertension; Acute on chronic diastolic CHF (congestive heart failure), NYHA class 3 ([place]); OSA (obstructive sleep apnea); Peripheral arterial disease ([place]); Stage 3 chronic kidney disease ([place]); Claudication in peripheral vascular disease ([place]); HIV disease ([place]); Easy bruising; Coronary artery disease involving native coronary artery of native heart with unstable angina pectoris ([place]); Hypertensive urgency; Callus of foot; Dyspnea; Hypertensive crisis; Acute respiratory failure with hypoxia ([place]); 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 Dulera rx refill.) .   Past Surgical History:  Procedure  Laterality Date   IR RADIOLOGIST EVAL & MGMT  05/31/2021   IR RADIOLOGIST EVAL & MGMT  07/26/2021   LEFT HEART CATH AND CORONARY ANGIOGRAPHY N/A 10/23/2016   Procedure: LEFT HEART CATH AND CORONARY ANGIOGRAPHY;  Surgeon: Leonie Man, MD;  Location: [facility];  Service: Cardiovascular;  Laterality: N/A;   LOWER EXTREMITY ANGIOGRAPHY N/A  07/17/2016   Procedure: Lower Extremity Angiography;  Surgeon: Lorretta Harp, MD;  Location: [facility];  Service: Cardiovascular;  Laterality: N/A;   LOWER EXTREMITY INTERVENTION N/A 06/05/2016   Procedure: Lower Extremity Intervention;  Surgeon: Lorretta Harp, MD;  Location: [facility];  Service: Cardiovascular;  Laterality: N/A;   PERIPHERAL VASCULAR ATHERECTOMY Right 07/17/2016   Procedure: Peripheral Vascular Atherectomy;  Surgeon: Lorretta Harp, MD;  Location: [facility];  Service: Cardiovascular;  Laterality: Right;  SFA   PERIPHERAL VASCULAR INTERVENTION  06/05/2016   Procedure: Peripheral Vascular Intervention;  Surgeon: Lorretta Harp, MD;  Location: [facility];  Service: Cardiovascular;;  left SFA   RADIOLOGY WITH ANESTHESIA Right 06/29/2021   Procedure: RIGHT RENAL CRYOABLATION;  Surgeon: Markus Daft, MD;  Location: [facility];  Service: Anesthesiology;  Laterality: Right;    Outpatient Medications Prior to Visit  Medication Sig Dispense Refill   dolutegravir-lamiVUDine (DOVATO) 50-300 MG tablet Take 1 tablet by mouth daily. 30 tablet 11   DULERA 100-5 MCG/ACT AERO Inhale 2 puffs into the lungs in the morning and at bedtime. 13 g 0   carvedilol (COREG) 6.25 MG tablet Take 1 tablet (6.25 mg total) by mouth 2 (two) times daily with a meal. 180 tablet 3   valsartan (DIOVAN) 80 MG tablet Take 1 tablet (80 mg total) by mouth daily. 30 tablet 0   albuterol (VENTOLIN HFA) 108 (90 Base) MCG/ACT inhaler Inhale 2 puffs into the lungs every 6 (six) hours as needed for wheezing or shortness of breath. (Patient not taking: Reported on 03/13/2022) 18 g 0   ipratropium-albuterol (DUONEB) 0.5-2.5 (3) MG/3ML SOLN Take 3 mLs by nebulization every 6 (six) hours as needed. (Patient not taking: Reported on 03/13/2022) 360 mL 1   amLODipine (NORVASC) 10 MG tablet Take 1 tablet (10 mg total) by mouth daily. (Patient not taking: Reported on 03/13/2022) 30 tablet 0   aspirin EC  81 MG tablet Take 1 tablet (81 mg total) by mouth daily. Swallow whole. (Patient not taking: Reported on 03/13/2022) 30 tablet 12   atorvastatin (LIPITOR) 40 MG tablet Take 1 tablet (40 mg total) by mouth daily. (Patient not taking: Reported on 03/13/2022) 60 tablet 3   isosorbide mononitrate (IMDUR) 60 MG 24 hr tablet Take 1 tablet (60 mg total) by mouth daily. (Patient not taking: Reported on 03/13/2022) 30 tablet 0   lidocaine (LIDODERM) 5 % Place 1 patch onto the skin daily. Remove & Discard patch within 12 hours or as directed by MD (Patient not taking: Reported on 03/13/2022) 30 patch 0   Multiple Vitamin (MULTIVITAMIN) tablet Take 1 tablet by mouth daily. (Patient not taking: Reported on 03/13/2022)     pantoprazole (PROTONIX) 40 MG tablet Take 1 tablet (40 mg total) by mouth daily. (Patient not taking: Reported on 03/13/2022) 30 tablet 0   predniSONE (DELTASONE) 10 MG tablet Take 3 tablets by mouth daily for 3 days, then 2 tablets daily for 3 days, then 1 tablet daily for 3 days, then stop (Patient not taking: Reported on 03/13/2022) 18 tablet 0   No  facility-administered medications prior to visit.    Family History  Problem Relation Age of Onset   High blood pressure Mother    Lupus Mother     Social History   Socioeconomic History   Marital status: Significant Other    Spouse name: Not on file   Number of children: Not on file   Years of education: Not on file   Highest education level: Not on file  Occupational History   Not on file  Tobacco Use   Smoking status: Every Day    Packs/day: 0.10    Years: 42.00    Additional pack years: 0.00    Total pack years: 4.20    Types: Cigarettes, E-cigarettes    Passive exposure: Never   Smokeless tobacco: Never   Tobacco comments:    1-2 cigarettes a day; vaping  Vaping Use   Vaping Use: Every day   Substances: Nicotine, Flavoring  Substance and Sexual Activity   Alcohol use: Not Currently    Alcohol/week: 2.0 standard drinks of  alcohol    Types: 2 Cans of beer per week    Comment: rare   Drug use: Not Currently    Comment: rare   Sexual activity: Not Currently    Comment: declined condoms  Other Topics Concern   Not on file  Social History Narrative   Not on file   Social Determinants of Health   Financial Resource Strain: Not on file  Food Insecurity: No Food Insecurity (12/27/2021)   Hunger Vital Sign    Worried About Running Out of Food in the Last Year: Never true    Ran Out of Food in the Last Year: Never true  Transportation Needs: No Transportation Needs (12/27/2021)   PRAPARE - Hydrologist (Medical): No    Lack of Transportation (Non-Medical): No  Physical Activity: Not on file  Stress: Not on file  Social Connections: Not on file  Intimate Partner Violence: Not At Risk (12/27/2021)   Humiliation, Afraid, Rape, and Kick questionnaire    Fear of Current or Ex-Partner: No    Emotionally Abused: No    Physically Abused: No    Sexually Abused: No                                                                                                 Objective:  Physical Exam: BP (!) 150/88 (BP Location: Left Arm, Patient Position: Sitting, Cuff Size: Large)   Pulse 77   Temp (!) 97.4 F (36.3 C) (Temporal)   Wt 158 lb (71.7 kg)   SpO2 97%   BMI 21.13 kg/m    ***General: No acute distress. Awake and conversant.  Eyes: Normal conjunctiva, anicteric. Round symmetric pupils.  ENT: Hearing grossly intact. No nasal discharge.  Neck: Neck is supple. No masses or thyromegaly.  Respiratory: Respirations are non-labored. No auditory wheezing.  Skin: Warm. No rashes or ulcers.  Psych: Alert and oriented. Cooperative, Appropriate mood and affect, Normal judgment.  CV: No cyanosis or JVD MSK: Normal ambulation. No clubbing  Neuro: Sensation and CN II-XII grossly  normal.   Physical Exam       Alesia Banda, MD, MS

## 2022-04-29 DIAGNOSIS — G8929 Other chronic pain: Secondary | ICD-10-CM | POA: Insufficient documentation

## 2022-04-29 NOTE — Assessment & Plan Note (Signed)
Slightly worsened with Dulera.  Dulera refilled.  Patient to follow-up if no improvement.

## 2022-04-29 NOTE — Assessment & Plan Note (Signed)
Kevin [name] is experiencing persistent chronic low back pain, requesting alternatives to opioid pain management and expressing dissatisfaction with physical therapy.  Plan  Refer the patient to [facility] for a second opinion on pain management. Conduct a comprehensive evaluation to identify potential interventions beyond pharmacological management, such as a more tailored and rigorous physical therapy approach or alternative therapies like acupuncture or chiropractic care if not contraindicated.

## 2022-04-29 NOTE — Assessment & Plan Note (Signed)
The patient's hypertension is reportedly improved although not at goal on the current regimen of carvedilol and valsartan. Plan  Increase carvedilol and valsartan as ordered. Monitor blood pressure closely and consider adjustments in medication as necessary to achieve target blood pressure goals.

## 2022-05-02 ENCOUNTER — Telehealth: Payer: Self-pay | Admitting: Family Medicine

## 2022-05-02 LAB — CBC WITH DIFFERENTIAL/PLATELET
Absolute Monocytes: 460 cells/uL (ref 200–950)
Basophils Absolute: 37 cells/uL (ref 0–200)
Basophils Relative: 0.5 %
Eosinophils Absolute: 248 cells/uL (ref 15–500)
Eosinophils Relative: 3.4 %
HCT: 36.7 % — ABNORMAL LOW (ref 38.5–50.0)
Hemoglobin: 12.3 g/dL — ABNORMAL LOW (ref 13.2–17.1)
Lymphs Abs: 1898 cells/uL (ref 850–3900)
MCH: 31.5 pg (ref 27.0–33.0)
MCHC: 33.5 g/dL (ref 32.0–36.0)
MCV: 93.9 fL (ref 80.0–100.0)
MPV: 10.3 fL (ref 7.5–12.5)
Monocytes Relative: 6.3 %
Neutro Abs: 4657 cells/uL (ref 1500–7800)
Neutrophils Relative %: 63.8 %
Platelets: 314 10*3/uL (ref 140–400)
RBC: 3.91 10*6/uL — ABNORMAL LOW (ref 4.20–5.80)
RDW: 13 % (ref 11.0–15.0)
Total Lymphocyte: 26 %
WBC: 7.3 10*3/uL (ref 3.8–10.8)

## 2022-05-02 LAB — URINALYSIS, ROUTINE W REFLEX MICROSCOPIC
Bacteria, UA: NONE SEEN /HPF
Bilirubin Urine: NEGATIVE
Glucose, UA: NEGATIVE
Hgb urine dipstick: NEGATIVE
Hyaline Cast: NONE SEEN /LPF
Ketones, ur: NEGATIVE
Leukocytes,Ua: NEGATIVE
Nitrite: NEGATIVE
RBC / HPF: NONE SEEN /HPF (ref 0–2)
Specific Gravity, Urine: 1.014 (ref 1.001–1.035)
Squamous Epithelial / HPF: NONE SEEN /HPF (ref <=5)
WBC, UA: NONE SEEN /HPF (ref 0–5)
pH: 5.5 (ref 5.0–8.0)

## 2022-05-02 LAB — COMPREHENSIVE METABOLIC PANEL
AG Ratio: 1.3 (calc) (ref 1.0–2.5)
ALT: 5 U/L — ABNORMAL LOW (ref 9–46)
AST: 12 U/L (ref 10–35)
Albumin: 4.3 g/dL (ref 3.6–5.1)
Alkaline phosphatase (APISO): 78 U/L (ref 35–144)
BUN/Creatinine Ratio: 9 (calc) (ref 6–22)
BUN: 19 mg/dL (ref 7–25)
CO2: 22 mmol/L (ref 20–32)
Calcium: 9.3 mg/dL (ref 8.6–10.3)
Chloride: 108 mmol/L (ref 98–110)
Creat: 2.14 mg/dL — ABNORMAL HIGH (ref 0.70–1.35)
Globulin: 3.3 g/dL (calc) (ref 1.9–3.7)
Glucose, Bld: 85 mg/dL (ref 65–99)
Potassium: 4.2 mmol/L (ref 3.5–5.3)
Sodium: 141 mmol/L (ref 135–146)
Total Bilirubin: 0.5 mg/dL (ref 0.2–1.2)
Total Protein: 7.6 g/dL (ref 6.1–8.1)

## 2022-05-02 LAB — MICROALBUMIN / CREATININE URINE RATIO
Creatinine, Urine: 179 mg/dL (ref 20–320)
Microalb Creat Ratio: 106 mcg/mg creat — ABNORMAL HIGH (ref <30)
Microalb, Ur: 18.9 mg/dL

## 2022-05-02 LAB — LIPID PANEL
Cholesterol: 186 mg/dL (ref <200)
HDL: 44 mg/dL (ref >=40)
LDL Cholesterol (Calc): 119 mg/dL (calc) — ABNORMAL HIGH
Non-HDL Cholesterol (Calc): 142 mg/dL (calc) — ABNORMAL HIGH (ref <130)
Total CHOL/HDL Ratio: 4.2 (calc) (ref <5.0)
Triglycerides: 123 mg/dL (ref <150)

## 2022-05-02 LAB — VITAMIN D 1,25 DIHYDROXY
Vitamin D 1, 25 (OH)2 Total: 39 pg/mL (ref 18–72)
Vitamin D2 1, 25 (OH)2: <8 pg/mL
Vitamin D3 1, 25 (OH)2: 39 pg/mL

## 2022-05-02 LAB — HEMOGLOBIN A1C
Hgb A1c MFr Bld: 5.1 % of total Hgb (ref <5.7)
Mean Plasma Glucose: 100 mg/dL
eAG (mmol/L): 5.5 mmol/L

## 2022-05-02 LAB — MAGNESIUM: Magnesium: 2 mg/dL (ref 1.5–2.5)

## 2022-05-02 LAB — TSH: TSH: 1.28 mIU/L (ref 0.40–4.50)

## 2022-05-02 MED ORDER — DULERA 100-5 MCG/ACT IN AERO: 2.0000 | INHALATION_SPRAY | Freq: Two times a day (BID) | RESPIRATORY_TRACT | 0 refills | Status: DC

## 2022-05-02 NOTE — Telephone Encounter (Signed)
Caller Name: Johncarlos Call back phone #: [phone]   MEDICATION(S):  Delero  Days of Med Remaining: 0  Has the patient contacted their pharmacy (YES/NO)? yes What did pharmacy advise? To call and get a new script so he doesn't run out.  Preferred Pharmacy:  [facility] on [address]   Pt is having some issues and needs this asap

## 2022-05-02 NOTE — Telephone Encounter (Signed)
Chart supports rx. Last OV: 04/28/2022   Patient is aware of rx refill and verbalized understanding.

## 2022-05-03 ENCOUNTER — Telehealth: Payer: Self-pay | Admitting: Family Medicine

## 2022-05-03 ENCOUNTER — Other Ambulatory Visit: Payer: Self-pay | Admitting: Family Medicine

## 2022-05-03 ENCOUNTER — Other Ambulatory Visit: Payer: Medicaid Other | Admitting: *Deleted

## 2022-05-03 ENCOUNTER — Encounter: Payer: Self-pay | Admitting: *Deleted

## 2022-05-03 DIAGNOSIS — J441 Chronic obstructive pulmonary disease with (acute) exacerbation: Secondary | ICD-10-CM

## 2022-05-03 MED ORDER — ALBUTEROL SULFATE HFA 108 (90 BASE) MCG/ACT IN AERS: 2.0000 | INHALATION_SPRAY | Freq: Four times a day (QID) | RESPIRATORY_TRACT | 0 refills | Status: DC | PRN

## 2022-05-03 MED ORDER — FLUTICASONE-SALMETEROL 230-21 MCG/ACT IN AERO: 2.0000 | INHALATION_SPRAY | Freq: Two times a day (BID) | RESPIRATORY_TRACT | 2 refills | Status: DC

## 2022-05-03 NOTE — Telephone Encounter (Signed)
Caller Name: Aniel Call back phone #: [phone]  Reason for Call: Pt was told by [facility] that his appt would be 3 months out. He can not wait that long so please send referral to another.

## 2022-05-03 NOTE — Patient Outreach (Signed)
Medicaid Managed Care   Nurse Care Manager Note  05/03/2022 Name:  Kevin [name] MRN:  [id number] DOB:  Apr 27, 1955  Kevin [name] is an 67 y.o. year old male who is a primary patient of Bonnita Hollow, MD.  The [facility] Managed Care Coordination team was consulted for assistance with:    COPD  [name] was given information about Medicaid Managed Care Coordination team services today. Kevin [name] Patient agreed to services and verbal consent obtained.  Engaged with patient by telephone for initial visit in response to provider referral for case management and/or care coordination services.   Assessments/Interventions:  Review of past medical history, allergies, medications, health status, including review of consultants reports, laboratory and other test data, was performed as part of comprehensive evaluation and provision of chronic care management services.  SDOH (Social Determinants of Health) assessments and interventions performed: SDOH Interventions    Flowsheet Row Patient Outreach Telephone from 05/03/2022 in [place] Interventions   Food Insecurity Interventions Intervention Not Indicated  Transportation Interventions Payor Benefit  [facility] Medical Transportation [phone]       Care Plan  No Known Allergies  Medications Reviewed Today     Reviewed by Melissa Montane, RN (Registered Nurse) on 05/03/22 at [place] List Status: <None>   Medication Order Taking? Sig Documenting Provider Last Dose Status Informant  albuterol (VENTOLIN HFA) 108 (90 Base) MCG/ACT inhaler [id number] No Inhale 2 puffs into the lungs every 6 (six) hours as needed for wheezing or shortness of breath.  Patient not taking: Reported on 03/13/2022   Kevin Hamman, MD Not Taking Active            Med Note Kevin [name], Kevin [name] A   Wed May 03, 2022  9:34 AM) Needs refill  aspirin EC 81 MG tablet [id number] Yes Take 1 tablet (81 mg total) by mouth  daily. Swallow whole. Bonnita Hollow, MD Taking Active   atorvastatin (LIPITOR) 40 MG tablet [id number] Yes Take 1 tablet (40 mg total) by mouth daily. Bonnita Hollow, MD Taking Active   carvedilol (COREG) 12.5 MG tablet [id number] Yes Take 1 tablet (12.5 mg total) by mouth 2 (two) times daily with a meal. Bonnita Hollow, MD Taking Active   dolutegravir-lamiVUDine (DOVATO) 50-300 MG tablet [id number] Yes Take 1 tablet by mouth daily. [name], [name], MD Taking Active Self, Pharmacy Records  DULERA 100-5 MCG/ACT AERO [id number] No Inhale 2 puffs into the lungs in the morning and at bedtime.  Patient not taking: Reported on 05/03/2022   Bonnita Hollow, MD Not Taking Active            Med Note (Kevin [name] A   Wed May 03, 2022  9:30 AM) Pharmacy does not have in stock  ipratropium-albuterol (DUONEB) 0.5-2.5 (3) MG/3ML SOLN [id number] Yes Take 3 mLs by nebulization every 6 (six) hours as needed. Kevin Eden, NP Taking Active Self, Pharmacy Records  valsartan (DIOVAN) 160 MG tablet [id number] Yes Take 1 tablet (160 mg total) by mouth daily. Bonnita Hollow, MD Taking Active             Patient Active Problem List   Diagnosis Date Noted   Chronic low back pain 04/29/2022   Chronic heart failure with preserved ejection fraction (HFpEF) ([place]) 12/27/2021   HLD (hyperlipidemia) 12/27/2021   Chest pain 12/27/2021   Elevated troponin [id number]   Acute diastolic heart failure (Kevin [name]) 07/14/2021  Right renal mass 06/29/2021   Renal lesion 06/29/2021   Papillary renal cell carcinoma (Kevin [name]) 06/15/2021   Hypertensive crisis    Acute respiratory failure with hypoxia (HCC)    Dyspnea 04/04/2019   Callus of foot 07/10/2018   Hypertensive urgency 08/15/2017   Coronary artery disease involving native coronary artery of native heart with unstable angina pectoris ([place]) 10/19/2016   Easy bruising 08/11/2016   Claudication in peripheral vascular disease (Kevin [name]) 06/05/2016   Stage 3  chronic kidney disease (Kevin [name])    Peripheral arterial disease (Kevin [name]) 05/09/2016   Acute on chronic diastolic CHF (congestive heart failure), NYHA class 3 (Kevin [name]) 03/24/2016   OSA (obstructive sleep apnea) 03/24/2016   Essential hypertension 02/18/2016   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   Shortness of breath 02/07/2016   PAF (paroxysmal atrial fibrillation) (Kevin [name]) 02/07/2016   Tobacco abuse 02/07/2016   Acute on chronic renal insufficiency 02/07/2016   COPD exacerbation ([place]) 02/07/2016   Hypertensive emergency 02/07/2016   HIV disease (Kevin [name]) 08/27/2004    Conditions to be addressed/monitored per PCP order:  COPD  Care Plan : [place] [facility] [place]  Updates made by Melissa Montane, RN since 05/03/2022 12:00 AM     Problem: Health Management needs related to COPD      Long-Range Goal: Development of Plan of Care to address Health Management needs related to COPD   Start Date: 05/03/2022  Expected End Date: 08/01/2022  Note:   Current Barriers:  Chronic Disease Management support and education needs related to COPD  RNCM Clinical Goal(s):  Patient will verbalize understanding of plan for management of COPD as evidenced by patient reports take all medications exactly as prescribed and will call provider for medication related questions as evidenced by patient reports    attend all scheduled medical appointments: 3/21 with LCSW, 3/22 with BSW and RCID on 05/17/22 as evidenced by provider documentation        through collaboration with RN Care manager, provider, and care team.   Interventions: Inter-disciplinary care team collaboration (see longitudinal plan of care) Evaluation of current treatment plan related to  self management and patient's adherence to plan as established by provider Provided therapeutic listening Provided patient with medical transportation provided by [facility], [place] [phone] Provided patient with information to [facility]  [phone], to call and schedule an eye exam(referral has been placed by PCP) Reviewed upcoming appointments including: 05/03/22 with Kidney Specialist, 05/04/22 telephone visit with LCSW, 05/05/22 telephone visit with BSW and 05/17/22 with RCID   COPD: (Status: New goal.) Long Term Goal  Reviewed medications with patient, including use of prescribed maintenance and rescue inhalers, and provided instruction on medication management and the importance of adherence Provided patient with basic written and verbal COPD education on self care/management/and exacerbation prevention Advised patient to track and manage COPD triggers Advised patient to self assesses COPD action plan zone and make appointment with provider if in the yellow zone for 48 hours without improvement Advised patient to engage in light exercise as tolerated 3-5 days a week to aid in the the management of COPD Discussed the importance of adequate rest and management of fatigue with COPD Assessed social determinant of health barriers Collaborated with PCP for needed emergency inhaler and difficulty obtaining maintenance inhaler  Patient Goals/Self-Care Activities: Take medications as prescribed   Attend all scheduled provider appointments Call pharmacy for medication refills 3-7 days in advance of running out of medications Call provider office for new concerns or questions  Work with the Education officer, museum to address care coordination needs and will continue to work with the clinical team to address health care and disease management related needs identify and avoid work-related triggers develop a rescue plan develop a new routine to improve sleep get at least 7 to 8 hours of sleep at night       Follow Up:  Patient agrees to Care Plan and Follow-up.  Plan: The Managed Medicaid care management team will reach out to the patient again over the next 30 days.  Date/time of next scheduled RN care management/care coordination  outreach:  06/02/22 @ 10:30am  Lurena Joiner RN, BSN [place] RN Care Coordinator

## 2022-05-03 NOTE — Patient Instructions (Signed)
Visit Information  Kevin [name] was given information about Medicaid Managed Care team care coordination services as a part of their Healthy [facility] Medicaid benefit. Kevin [name] verbally consented to engagement with the [facility] Managed Care team.   If you are experiencing a medical emergency, please call 911 or report to your local emergency department or urgent care.   If you have a non-emergency medical problem during routine business hours, please contact your provider's office and ask to speak with a nurse.   For questions related to your Healthy [facility] health plan, please call: [phone] or visit the homepage here: [email or web address]  If you would like to schedule transportation through your Healthy [facility] plan, please call the following number at least 2 days in advance of your appointment: [phone]  For information about your ride after you set it up, call Ride Assist at [phone]. Use this number to activate a Will Call pickup, or if your transportation is late for a scheduled pickup. Use this number, too, if you need to make a change or cancel a previously scheduled reservation.  If you need transportation services right away, call [phone]. The after-hours call center is staffed 24 hours to handle ride assistance and urgent reservation requests (including discharges) 365 days a year. Urgent trips include sick visits, hospital discharge requests and life-sustaining treatment.  Call the [place] at [phone], at any time, 24 hours a day, 7 days a week. If you are in danger or need immediate medical attention call 911.  If you would like help to quit smoking, call 1-800-QUIT-NOW 786-581-2822) OR Espaol: 1-855-Djelo-Ya QO:409462) o para ms informacin haga clic aqu or Text READY to 200-400 to register via text  Kevin [name],   Please see education materials related to COPD provided as  print materials.   The patient verbalized understanding of instructions, educational materials, and care plan provided today and agreed to receive a mailed copy of patient instructions, educational materials, and care plan.   Telephone follow up appointment with Managed Medicaid care management team member scheduled for:06/02/22 @ 10:30am  Kevin Joiner RN, BSN Kevin Hill RN Care Coordinator   Following is a copy of your plan of care:  Care Plan : RN Care Manager Plan of Care  Updates made by Kevin Montane, RN since 05/03/2022 12:00 AM     Problem: Health Management needs related to COPD      Long-Range Goal: Development of Plan of Care to address Health Management needs related to COPD   Start Date: 05/03/2022  Expected End Date: 08/01/2022  Note:   Current Barriers:  Chronic Disease Management support and education needs related to COPD  RNCM Clinical Goal(s):  Patient will verbalize understanding of plan for management of COPD as evidenced by patient reports take all medications exactly as prescribed and will call provider for medication related questions as evidenced by patient reports    attend all scheduled medical appointments: 3/21 with LCSW, 3/22 with BSW and [facility]ID on 05/17/22 as evidenced by provider documentation        through collaboration with RN Care manager, provider, and care team.   Interventions: Inter-disciplinary care team collaboration (see longitudinal plan of care) Evaluation of current treatment plan related to  self management and patient's adherence to plan as established by provider Provided therapeutic listening Provided patient with medical transportation provided by [facility] [place] [phone] Provided patient with information to [facility] [phone], to call and  schedule an eye exam(referral has been placed by PCP) Reviewed upcoming appointments including: 05/03/22 with Kidney Specialist, 05/04/22 telephone  visit with LCSW, 05/05/22 telephone visit with BSW and 05/17/22 with RCID   COPD: (Status: New goal.) Long Term Goal  Reviewed medications with patient, including use of prescribed maintenance and rescue inhalers, and provided instruction on medication management and the importance of adherence Provided patient with basic written and verbal COPD education on self care/management/and exacerbation prevention Advised patient to track and manage COPD triggers Advised patient to self assesses COPD action plan zone and make appointment with provider if in the yellow zone for 48 hours without improvement Advised patient to engage in light exercise as tolerated 3-5 days a week to aid in the the management of COPD Discussed the importance of adequate rest and management of fatigue with COPD Assessed social determinant of health barriers Collaborated with PCP for needed emergency inhaler and difficulty obtaining maintenance inhaler  Patient Goals/Self-Care Activities: Take medications as prescribed   Attend all scheduled provider appointments Call pharmacy for medication refills 3-7 days in advance of running out of medications Call provider office for new concerns or questions  Work with the social worker to address care coordination needs and will continue to work with the clinical team to address health care and disease management related needs identify and avoid work-related triggers develop a rescue plan develop a new routine to improve sleep get at least 7 to 8 hours of sleep at night

## 2022-05-04 ENCOUNTER — Other Ambulatory Visit: Payer: Medicaid Other | Admitting: Licensed Clinical Social Worker

## 2022-05-04 NOTE — Telephone Encounter (Signed)
Called pt he stated he had seen [facility] in the past and wanted to got if I could send it there. I sent over referral to [facility]

## 2022-05-04 NOTE — Patient Outreach (Signed)
Medicaid Managed Care Social Work Note  05/04/2022 Name:  Kevin [name] MRN:  [id number] DOB:  12-30-1955  Kevin [name] is an 67 y.o. year old male who is a primary patient of Kevin Hollow, MD.  The Medicaid Managed Care Coordination team was consulted for assistance with:  [place] and Resources  Kevin [name] was given information about Medicaid Managed Care Coordination team services today. Kevin [name] Patient agreed to services and verbal consent obtained.  Engaged with patient  for by telephone forinitial visit in response to referral for case management and/or care coordination services. Patient signed up for [facility] program yesterday and is already involved with [place]. Patient reports that he does not wish to gain mental health support as his main concern is socialization at this time and he struggles with transportation. Socialization activity/resource education provided. [facility] LCSW will sign off at this time and [facility] BSW will speak with patient tomorrow regarding transportation so that he can start to get out of the house more.   Assessments/Interventions:  Review of past medical history, allergies, medications, health status, including review of consultants reports, laboratory and other test data, was performed as part of comprehensive evaluation and provision of chronic care management services.  SDOH: (Social Determinant of Health) assessments and interventions performed: SDOH Interventions    Flowsheet Row Patient Outreach Telephone from 05/04/2022 in [place] Patient Outreach Telephone from 05/03/2022 in [place] Interventions -- Intervention Not Indicated  Transportation Interventions -- [facility] [phone]  Stress Interventions Offered [employer], Provide Counseling --       Advanced  Directives Status:  Not addressed in this encounter.  Care Plan                 No Known Allergies  Medications Reviewed Today     Reviewed by Melissa Montane, RN (Registered Nurse) on 05/03/22 at [place] List Status: <None>   Medication Order Taking? Sig Documenting Provider Last Dose Status Informant  albuterol (VENTOLIN HFA) 108 (90 Base) MCG/ACT inhaler [id number] No Inhale 2 puffs into the lungs every 6 (six) hours as needed for wheezing or shortness of breath.  Patient not taking: Reported on 03/13/2022   [name], MD Not Taking Active            Med Note [name], MELANIE A   Wed May 03, 2022  9:34 AM) Needs refill  aspirin EC 81 MG tablet [id number] Yes Take 1 tablet (81 mg total) by mouth daily. Swallow whole. Kevin Hollow, MD Taking Active   atorvastatin (LIPITOR) 40 MG tablet [id number] Yes Take 1 tablet (40 mg total) by mouth daily. Kevin Hollow, MD Taking Active   carvedilol (COREG) 12.5 MG tablet [id number] Yes Take 1 tablet (12.5 mg total) by mouth 2 (two) times daily with a meal. Kevin Hollow, MD Taking Active   dolutegravir-lamiVUDine (DOVATO) 50-300 MG tablet [id number] Yes Take 1 tablet by mouth daily. [name], [name], MD Taking Active Self, Pharmacy Records  DULERA 100-5 MCG/ACT AERO [id number] No Inhale 2 puffs into the lungs in the morning and at bedtime.  Patient not taking: Reported on 05/03/2022   Kevin Hollow, MD Not Taking Active            Med Note (ROBB, MELANIE A   Wed May 03, 2022  9:30 AM)  Pharmacy does not have in stock  ipratropium-albuterol (DUONEB) 0.5-2.5 (3) MG/3ML SOLN [id number] Yes Take 3 mLs by nebulization every 6 (six) hours as needed. Hans Eden, NP Taking Active Self, Pharmacy Records  valsartan (DIOVAN) 160 MG tablet [id number] Yes Take 1 tablet (160 mg total) by mouth daily. Kevin Hollow, MD Taking Active             Patient Active Problem List   Diagnosis Date Noted   Chronic low back pain 04/29/2022    Chronic heart failure with preserved ejection fraction (HFpEF) ([place]) 12/27/2021   HLD (hyperlipidemia) 12/27/2021   Chest pain 12/27/2021   Elevated troponin [id number]   Acute diastolic heart failure ([place]) 07/14/2021   Right renal mass 06/29/2021   Renal lesion 06/29/2021   Papillary renal cell carcinoma ([place]) 06/15/2021   Hypertensive crisis    Acute respiratory failure with hypoxia ([place])    Dyspnea 04/04/2019   Callus of foot 07/10/2018   Hypertensive urgency 08/15/2017   Coronary artery disease involving native coronary artery of native heart with unstable angina pectoris ([place]) 10/19/2016   Easy bruising 08/11/2016   Claudication in peripheral vascular disease ([place]) 06/05/2016   Stage 3 chronic kidney disease ([place])    Peripheral arterial disease ([place]) 05/09/2016   Acute on chronic diastolic CHF (congestive heart failure), NYHA class 3 ([place]) 03/24/2016   OSA (obstructive sleep apnea) 03/24/2016   Essential hypertension 02/18/2016   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

## 2022-05-05 ENCOUNTER — Other Ambulatory Visit: Payer: Medicaid Other

## 2022-05-05 NOTE — Patient Outreach (Signed)
Medicaid Managed Care Social Work Note  05/05/2022 Name:  Kevin [name] MRN:  [id number] DOB:  05-27-1955  Kevin [name] is an 67 y.o. year old male who is a primary patient of Bonnita Hollow, MD.  The Medicaid Managed Care Coordination team was consulted for assistance with:  Transportation Needs   Mr. Sharpley was given information about Medicaid Managed Care Coordination team services today. Kevin [name] Patient agreed to services and verbal consent obtained.  Engaged with patient  for by telephone forinitial visit in response to referral for case management and/or care coordination services.   Assessments/Interventions:  Review of past medical history, allergies, medications, health status, including review of consultants reports, laboratory and other test data, was performed as part of comprehensive evaluation and provision of chronic care management services.  SDOH: (Social Determinant of Health) assessments and interventions performed: SDOH Interventions    Flowsheet Row Patient Outreach Telephone from 05/04/2022 in [place] Patient Outreach Telephone from 05/03/2022 in [place] Interventions -- Intervention Not Indicated  Transportation Interventions -- [place] [facility] [phone]  Stress Interventions [place], Provide Counseling --     BSW completed a telephone outreach with patient. He stated transportation for his doctor appointments were needed. BSW directed patient to [facility]. [place] stated he does have his card, BSW informed patient to contact [employer] 2 days prior to his appointment to set up transportation, patient understood. No other resources needed at this time.  Advanced Directives Status:  Not addressed in this encounter.  Care Plan                 No Known Allergies  Medications  Reviewed Today     Reviewed by Melissa Montane, RN (Registered Nurse) on 05/03/22 at [place] List Status: <None>   Medication Order Taking? Sig Documenting Provider Last Dose Status Informant  albuterol (VENTOLIN HFA) 108 (90 Base) MCG/ACT inhaler [id number] No Inhale 2 puffs into the lungs every 6 (six) hours as needed for wheezing or shortness of breath.  Patient not taking: Reported on 03/13/2022   [name], MD Not Taking Active            Med Note [name], MELANIE A   Wed May 03, 2022  9:34 AM) Needs refill  aspirin EC 81 MG tablet [id number] Yes Take 1 tablet (81 mg total) by mouth daily. Swallow whole. Bonnita Hollow, MD Taking Active   atorvastatin (LIPITOR) 40 MG tablet [id number] Yes Take 1 tablet (40 mg total) by mouth daily. Bonnita Hollow, MD Taking Active   carvedilol (COREG) 12.5 MG tablet [id number] Yes Take 1 tablet (12.5 mg total) by mouth 2 (two) times daily with a meal. Bonnita Hollow, MD Taking Active   dolutegravir-lamiVUDine (DOVATO) 50-300 MG tablet [id number] Yes Take 1 tablet by mouth daily. [name], [name], MD Taking Active Self, Pharmacy Records  DULERA 100-5 MCG/ACT AERO [id number] No Inhale 2 puffs into the lungs in the morning and at bedtime.  Patient not taking: Reported on 05/03/2022   Bonnita Hollow, MD Not Taking Active            Med Note (ROBB, MELANIE A   Wed May 03, 2022  9:30 AM) Pharmacy does not have in stock  ipratropium-albuterol (DUONEB) 0.5-2.5 (3) MG/3ML SOLN [id number] Yes Take 3 mLs by nebulization every 6 (  six) hours as needed. Hans Eden, NP Taking Active Self, Pharmacy Records  valsartan (DIOVAN) 160 MG tablet [id number] Yes Take 1 tablet (160 mg total) by mouth daily. 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   Hypertensive crisis    Acute respiratory failure with hypoxia ([place])    Dyspnea 04/04/2019   Callus of foot 07/10/2018   Hypertensive urgency 08/15/2017   Coronary artery disease involving native coronary artery of native heart with unstable angina pectoris ([place]) 10/19/2016   Easy bruising 08/11/2016   Claudication in peripheral vascular disease ([place]) 06/05/2016   Stage 3 chronic kidney disease ([place])    Peripheral arterial disease ([place]) 05/09/2016   Acute on chronic diastolic CHF (congestive heart failure), NYHA class 3 ([place]) 03/24/2016   OSA (obstructive sleep apnea) 03/24/2016   Essential hypertension 02/18/2016   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   Follow up:  Patient agrees to Care Plan and Follow-up.  Plan: The  Patient has been provided with contact information for the Managed Medicaid care management team and has been advised to call with any health related questions or concerns.   Mickel Fuchs, BSW, [place] Managed Medicaid Team  [phone]

## 2022-05-05 NOTE — Patient Instructions (Signed)
Visit Information  Mr. Lamattina was given information about Medicaid Managed Care team care coordination services as a part of their Healthy [facility] Medicaid benefit. [name] verbally consented to engagement with the [facility] Managed Care team.   If you are experiencing a medical emergency, please call 911 or report to your local emergency department or urgent care.   If you have a non-emergency medical problem during routine business hours, please contact your provider's office and ask to speak with a nurse.   For questions related to your Healthy [facility] health plan, please call: [phone] or visit the homepage here: [email or web address]  If you would like to schedule transportation through your Healthy [facility] plan, please call the following number at least 2 days in advance of your appointment: [phone]  For information about your ride after you set it up, call Ride Assist at [phone]. Use this number to activate a Will Call pickup, or if your transportation is late for a scheduled pickup. Use this number, too, if you need to make a change or cancel a previously scheduled reservation.  If you need transportation services right away, call [phone]. The after-hours call center is staffed 24 hours to handle ride assistance and urgent reservation requests (including discharges) 365 days a year. Urgent trips include sick visits, hospital discharge requests and life-sustaining treatment.  Call the [place] at [phone], at any time, 24 hours a day, 7 days a week. If you are in danger or need immediate medical attention call 911.  If you would like help to quit smoking, call 1-800-QUIT-NOW 682-285-6327) OR Espaol: 1-855-Djelo-Ya HD:1601594) o para ms informacin haga clic aqu or Text READY to 200-400 to register via text  Mr. Diane - following are the goals we discussed in your visit today:   Goals  Addressed   None      The  Patient                                              has been provided with contact information for the Managed Medicaid care management team and has been advised to call with any health related questions or concerns.   Mickel Fuchs, BSW, [place] Managed Medicaid Team  [phone]   Following is a copy of your plan of care:  There are no care plans that you recently modified to display for this patient.

## 2022-05-08 ENCOUNTER — Emergency Department (HOSPITAL_COMMUNITY)
Admission: EM | Admit: 2022-05-08 | Discharge: 2022-05-08 | Disposition: A | Discharge status: Home / Self Care | Payer: Medicaid Other | Attending: Emergency Medicine | Admitting: Emergency Medicine

## 2022-05-08 ENCOUNTER — Encounter (HOSPITAL_COMMUNITY): Payer: Self-pay

## 2022-05-08 ENCOUNTER — Other Ambulatory Visit: Payer: Self-pay

## 2022-05-08 ENCOUNTER — Emergency Department (HOSPITAL_COMMUNITY): Payer: Medicaid Other

## 2022-05-08 DIAGNOSIS — I130 Hypertensive heart and chronic kidney disease with heart failure and stage 1 through stage 4 chronic kidney disease, or unspecified chronic kidney disease: Secondary | ICD-10-CM | POA: Diagnosis not present

## 2022-05-08 DIAGNOSIS — Z79899 Other long term (current) drug therapy: Secondary | ICD-10-CM | POA: Insufficient documentation

## 2022-05-08 DIAGNOSIS — N189 Chronic kidney disease, unspecified: Secondary | ICD-10-CM | POA: Insufficient documentation

## 2022-05-08 DIAGNOSIS — M546 Pain in thoracic spine: Secondary | ICD-10-CM | POA: Insufficient documentation

## 2022-05-08 DIAGNOSIS — Z7982 Long term (current) use of aspirin: Secondary | ICD-10-CM | POA: Insufficient documentation

## 2022-05-08 DIAGNOSIS — I509 Heart failure, unspecified: Secondary | ICD-10-CM | POA: Diagnosis not present

## 2022-05-08 DIAGNOSIS — M549 Dorsalgia, unspecified: Secondary | ICD-10-CM | POA: Diagnosis present

## 2022-05-08 DIAGNOSIS — I160 Hypertensive urgency: Secondary | ICD-10-CM | POA: Insufficient documentation

## 2022-05-08 LAB — BASIC METABOLIC PANEL
Anion gap: 4 — ABNORMAL LOW (ref 5–15)
BUN: 16 mg/dL (ref 8–23)
CO2: 24 mmol/L (ref 22–32)
Calcium: 8.5 mg/dL — ABNORMAL LOW (ref 8.9–10.3)
Chloride: 113 mmol/L — ABNORMAL HIGH (ref 98–111)
Creatinine, Ser: 1.8 mg/dL — ABNORMAL HIGH (ref 0.61–1.24)
GFR, Estimated: 42 mL/min — ABNORMAL LOW (ref >60)
Glucose, Bld: 76 mg/dL (ref 70–99)
Potassium: 4.2 mmol/L (ref 3.5–5.1)
Sodium: 141 mmol/L (ref 135–145)

## 2022-05-08 LAB — CBC
HCT: 33.4 % — ABNORMAL LOW (ref 39.0–52.0)
Hemoglobin: 11 g/dL — ABNORMAL LOW (ref 13.0–17.0)
MCH: 31.8 pg (ref 26.0–34.0)
MCHC: 32.9 g/dL (ref 30.0–36.0)
MCV: 96.5 fL (ref 80.0–100.0)
Platelets: 304 10*3/uL (ref 150–400)
RBC: 3.46 MIL/uL — ABNORMAL LOW (ref 4.22–5.81)
RDW: 13.9 % (ref 11.5–15.5)
WBC: 7.9 10*3/uL (ref 4.0–10.5)
nRBC: 0 % (ref 0.0–0.2)

## 2022-05-08 LAB — TROPONIN I (HIGH SENSITIVITY)
Troponin I (High Sensitivity): 38 ng/L — ABNORMAL HIGH (ref <18)
Troponin I (High Sensitivity): 41 ng/L — ABNORMAL HIGH (ref <18)

## 2022-05-08 LAB — D-DIMER, QUANTITATIVE: D-Dimer, Quant: 0.59 ug/mL-FEU — ABNORMAL HIGH (ref 0.00–0.50)

## 2022-05-08 MED ORDER — CARVEDILOL 12.5 MG PO TABS
12.5000 mg | ORAL_TABLET | Freq: Once | ORAL | Status: AC
  Administered 2022-05-08: 12.5 mg via ORAL
  Filled 2022-05-08: qty 1

## 2022-05-08 MED ORDER — IRBESARTAN 150 MG PO TABS
150.0000 mg | ORAL_TABLET | Freq: Once | ORAL | Status: AC
  Administered 2022-05-08: 150 mg via ORAL
  Filled 2022-05-08: qty 1

## 2022-05-08 MED ORDER — CYCLOBENZAPRINE HCL 10 MG PO TABS
10.0000 mg | ORAL_TABLET | Freq: Once | ORAL | Status: AC
  Administered 2022-05-08: 10 mg via ORAL
  Filled 2022-05-08: qty 1

## 2022-05-08 MED ORDER — ACETAMINOPHEN 500 MG PO TABS
1000.0000 mg | ORAL_TABLET | Freq: Once | ORAL | Status: AC
  Administered 2022-05-08: 1000 mg via ORAL
  Filled 2022-05-08: qty 2

## 2022-05-08 NOTE — ED Triage Notes (Addendum)
C/o centralized cp radiating through to upper back that woke him from sleep this am.  Pain worse when leaning foward Denies sob Hx copd

## 2022-05-08 NOTE — ED Provider Triage Note (Signed)
Emergency Medicine Provider Triage Evaluation Note  Kevin [name] , a 67 y.o. male  was evaluated in triage.  Pt complains of sharp chest pain radiating to back since this morning.  Patient reports that the pain was present when he woke up.  He reports worse with deep breathing, laughing, bending over.  He denies significant worsening with exertion.  He denies nausea, vomiting.  He denies any previous history of ACS.  He reports he is trying to quit smoking.  Patient with history of HIV, and reports that he is taking his antiretroviral therapy without missing any doses, although reports he did not take any this morning. Hx of HLD, renal insufficiency, paroxysmal A-fib, tobacco abuse, COPD, hypertension, renal cell carcinoma  Review of Systems  Positive: Chest pain, shob Negative: Fever, chills  Physical Exam  BP (!) 192/97   Pulse (!) 56   Temp 98.2 F (36.8 C) (Oral)   Resp 20   SpO2 100%  Gen:   Awake, no distress   Resp:  Normal effort  MSK:   Moves extremities without difficulty  Other:  No ttp of chest wall  Medical Decision Making  Medically screening exam initiated at 4:30 PM.  Appropriate orders placed.  [name] was informed that the remainder of the evaluation will be completed by another provider, this initial triage assessment does not replace that evaluation, and the importance of remaining in the ED until their evaluation is complete.  Workup initiated in triage    Anselmo Pickler, [place] 05/08/22 [id number]

## 2022-05-08 NOTE — ED Provider Notes (Signed)
[place] EMERGENCY DEPARTMENT AT [facility] Provider Note   CSN: [id number] Arrival date & time: 05/08/22  1523     History  Chief Complaint  Patient presents with   Chest Pain    Kevin [name] is a 67 y.o. male.  HPI 67 year old male with a history of CHF, AIDS, CKD, hypertension presents with back pain.  Symptoms started when he woke up this afternoon.  There is a little bit of discomfort in his chest but he feels like is more on the inside but primarily in his back.  There is no pain radiating from his chest to his back.  It hurts worst when he takes a deep breath but certain movements, laughing, coughing or twisting also make it worse.  At first he does not remember specific injury though he does state that yesterday he did do some exercises where he was lifting his arms from below his waist up above his head multiple times.  This is the first time he is done that in a while.  He has not taken any medicines for this.  He also has not yet taken his blood pressure meds and states that with the current regimen his blood pressure has been closer to [id number] systolic.  There is no shortness of breath, cough, fever, leg swelling or leg weakness.  Home Medications Prior to Admission medications   Medication Sig Start Date End Date Taking? Authorizing Provider  albuterol (VENTOLIN HFA) 108 (90 Base) MCG/ACT inhaler Inhale 2 puffs into the lungs every 6 (six) hours as needed for wheezing or shortness of breath. 05/03/22   Bonnita Hollow, MD  aspirin EC 81 MG tablet Take 1 tablet (81 mg total) by mouth daily. Swallow whole. 04/28/22   Bonnita Hollow, MD  atorvastatin (LIPITOR) 40 MG tablet Take 1 tablet (40 mg total) by mouth daily. 04/28/22   Bonnita Hollow, MD  carvedilol (COREG) 12.5 MG tablet Take 1 tablet (12.5 mg total) by mouth 2 (two) times daily with a meal. 04/28/22 04/28/23  Bonnita Hollow, MD  dolutegravir-lamiVUDine (DOVATO) 50-300 MG tablet Take 1 tablet by mouth  daily. 08/08/21   Truman Hayward, MD  fluticasone-salmeterol (ADVAIR HFA) [phone] MCG/ACT inhaler Inhale 2 puffs into the lungs 2 (two) times daily. 05/03/22 08/01/22  Bonnita Hollow, MD  ipratropium-albuterol (DUONEB) 0.5-2.5 (3) MG/3ML SOLN Take 3 mLs by nebulization every 6 (six) hours as needed. 11/12/21   White, Leitha Schuller, NP  valsartan (DIOVAN) 160 MG tablet Take 1 tablet (160 mg total) by mouth daily. 05/01/22 07/30/22  Bonnita Hollow, MD      Allergies    Patient has no known allergies.    Review of Systems   Review of Systems  Constitutional:  Negative for fever.  Respiratory:  Negative for cough and shortness of breath.   Cardiovascular:  Positive for chest pain.  Gastrointestinal:  Negative for abdominal pain.  Musculoskeletal:  Negative for back pain.  Neurological:  Negative for weakness.    Physical Exam Updated Vital Signs BP (!) 195/96   Pulse (!) 49   Temp 98.2 F (36.8 C)   Resp 15   Ht 6' 0.5" (1.842 m)   Wt 71.7 kg   SpO2 100%   BMI 21.13 kg/m  Physical Exam Vitals and nursing note reviewed.  Constitutional:      General: He is not in acute distress.    Appearance: He is well-developed. He is not ill-appearing or diaphoretic.  HENT:     Head: Normocephalic and atraumatic.  Cardiovascular:     Rate and Rhythm: Normal rate and regular rhythm.     Heart sounds: Normal heart sounds.  Pulmonary:     Effort: Pulmonary effort is normal.     Breath sounds: Normal breath sounds.  Chest:     Chest wall: No tenderness.  Abdominal:     Palpations: Abdomen is soft.     Tenderness: There is no abdominal tenderness.  Musculoskeletal:     Comments: No reproducible back pain  Skin:    General: Skin is warm and dry.  Neurological:     Mental Status: He is alert.     Comments: Equal strength and sensation in BLE.      ED Results / Procedures / Treatments   Labs (all labs ordered are listed, but only abnormal results are displayed) Labs Reviewed   BASIC METABOLIC PANEL - Abnormal; Notable for the following components:      Result Value   Chloride 113 (*)    Creatinine, Ser 1.80 (*)    Calcium 8.5 (*)    GFR, Estimated 42 (*)    Anion gap 4 (*)    All other components within normal limits  CBC - Abnormal; Notable for the following components:   RBC 3.46 (*)    Hemoglobin 11.0 (*)    HCT 33.4 (*)    All other components within normal limits  D-DIMER, QUANTITATIVE - Abnormal; Notable for the following components:   D-Dimer, Quant 0.59 (*)    All other components within normal limits  TROPONIN I (HIGH SENSITIVITY) - Abnormal; Notable for the following components:   Troponin I (High Sensitivity) 38 (*)    All other components within normal limits  TROPONIN I (HIGH SENSITIVITY) - Abnormal; Notable for the following components:   Troponin I (High Sensitivity) 41 (*)    All other components within normal limits    EKG EKG Interpretation  Date/Time:  Monday May 08 2022 16:02:31 EDT Ventricular Rate:  55 PR Interval:  196 QRS Duration: 91 QT Interval:  466 QTC Calculation: 446 R Axis:   62 Text Interpretation: Sinus rhythm Probable left atrial enlargement Left ventricular hypertrophy Anterior infarct, old Abnormal T, consider ischemia, diffuse leads No significant change since Nov 2023 Confirmed by [name] [phone]) on 05/08/2022 6:09:45 PM  Radiology DG Chest 2 View  Result Date: 05/08/2022 CLINICAL DATA:  Chest pain EXAM: CHEST - 2 VIEW COMPARISON:  Chest radiograph 12/27/2021 FINDINGS: The cardiomediastinal contours are within normal limits. The lungs are clear. No pneumothorax or pleural effusion. No acute finding in the visualized skeleton. IMPRESSION: No acute cardiopulmonary finding. Electronically Signed   By: Audie Pinto M.D.   On: 05/08/2022 16:48    Procedures Procedures    Medications Ordered in ED Medications  irbesartan (AVAPRO) tablet 150 mg (150 mg Oral Given by Other 05/08/22 1854)   carvedilol (COREG) tablet 12.5 mg (12.5 mg Oral Given 05/08/22 1825)  acetaminophen (TYLENOL) tablet 1,000 mg (1,000 mg Oral Given 05/08/22 1825)  cyclobenzaprine (FLEXERIL) tablet 10 mg (10 mg Oral Given by Other 05/08/22 1825)    ED Course/ Medical Decision Making/ A&P                             Medical Decision Making Amount and/or Complexity of Data Reviewed Labs: ordered.    Details: CKD that is baseline/improved from prior.  Troponin is slightly  elevated but flat and similar to other values.  D-dimer is negative when age-adjusted. Radiology: independent interpretation performed.    Details: No CHF/pneumonia ECG/medicine tests: independent interpretation performed.    Details: No acute ischemia, unchanged from baseline  Risk OTC drugs. Prescription drug management.   I suspect patient's pain, which is primarily in his back, is from the exercises he did.  Given the pleuritic nature, though it is also with certain movements, a D-dimer was sent but is age-adjusted negative.  He is pretty hypertensive though this seems to be chronic based on chart review.  He was given his oral home meds.  I think ACS, dissection, or other intrathoracic emergency is unlikely.  He is feeling a lot better with Tylenol and Flexeril.  He declines a prescription but feels well enough for discharge.  Will discharge home with return precautions.        Final Clinical Impression(s) / ED Diagnoses Final diagnoses:  Acute thoracic back pain, unspecified back pain laterality  Hypertensive urgency    Rx / DC Orders ED Discharge Orders     None         [name], MD 05/08/22 2305

## 2022-05-08 NOTE — Discharge Instructions (Signed)
If you develop worsening, recurrent, or continued back pain, chest pain, numbness or weakness in the legs, incontinence of your bowels or bladders, numbness of your buttocks, fever, abdominal pain, or any other new/concerning symptoms then return to the ER for evaluation.  °

## 2022-05-10 ENCOUNTER — Telehealth: Payer: Self-pay

## 2022-05-10 NOTE — Transitions of Care (Post Inpatient/ED Visit) (Signed)
   05/10/2022  Name: Kevin [name] MRN: [id number] DOB: 03/03/1955  Today's TOC FU Call Status:    Transition Care Management Follow-up Telephone Call Date of Discharge: 05/08/22 Discharge Facility: [facility]) Type of Discharge: Emergency Department How have you been since you were released from the hospital?: Better Any questions or concerns?: No  Items Reviewed: Did you receive and understand the discharge instructions provided?: Yes Medications obtained and verified?: No Any new allergies since your discharge?: No Dietary orders reviewed?: No Do you have support at home?: Yes  Home Care and Equipment/Supplies: [place] Ordered?: NA Any new equipment or medical supplies ordered?: NA  Functional Questionnaire: Do you need assistance with bathing/showering or dressing?: No Do you need assistance with meal preparation?: No Do you need assistance with eating?: No Do you have difficulty maintaining continence: No Do you need assistance with getting out of bed/getting out of a chair/moving?: No Do you have difficulty managing or taking your medications?: No  Follow up appointments reviewed: PCP Follow-up appointment confirmed?: Yes Date of PCP follow-up appointment?: 05/16/22 Follow-up Provider: Dr. Grandville Silos [facility] Follow-up appointment confirmed?: No Do you need transportation to your follow-up appointment?: No Do you understand care options if your condition(s) worsen?: Yes-patient verbalized understanding    SIGNATURE Angeline Slim, BSN, RN

## 2022-05-15 ENCOUNTER — Telehealth: Payer: Self-pay | Admitting: Family Medicine

## 2022-05-15 NOTE — Telephone Encounter (Signed)
Caller Name: Judson Roch Call back phone #: [phone]  Reason for Call: [facility] stated that the pt is saying that he is supposed to have Vidette. Advair was called in on the 20th. Please call to clarify.

## 2022-05-15 NOTE — Telephone Encounter (Signed)
Spoke with patient and advised him that Advair was sent in due to [place] being out of stock at pharmacy. Patient verbalized understanding. He stated he felt like his 05/16/2022 visit wasn't neccessary and that he felt better and his pain previously was from how he was exercising. Patient requested to cancel appointment with PCP tomorrow. Appointment canceled.

## 2022-05-16 ENCOUNTER — Inpatient Hospital Stay: Payer: Medicaid Other | Admitting: Family Medicine

## 2022-05-17 ENCOUNTER — Ambulatory Visit: Payer: Medicaid Other | Admitting: Infectious Disease

## 2022-05-25 ENCOUNTER — Other Ambulatory Visit (HOSPITAL_COMMUNITY): Payer: Self-pay

## 2022-05-29 ENCOUNTER — Other Ambulatory Visit: Payer: Self-pay

## 2022-06-01 ENCOUNTER — Encounter (HOSPITAL_COMMUNITY): Payer: Self-pay

## 2022-06-01 ENCOUNTER — Ambulatory Visit: Payer: Medicaid Other | Admitting: Infectious Disease

## 2022-06-01 ENCOUNTER — Other Ambulatory Visit: Payer: Self-pay

## 2022-06-01 ENCOUNTER — Emergency Department (HOSPITAL_COMMUNITY)
Admission: EM | Admit: 2022-06-01 | Discharge: 2022-06-01 | Disposition: A | Discharge status: Home / Self Care | Payer: Medicaid Other | Attending: Emergency Medicine | Admitting: Emergency Medicine

## 2022-06-01 ENCOUNTER — Emergency Department (HOSPITAL_COMMUNITY): Payer: Medicaid Other

## 2022-06-01 DIAGNOSIS — Z79899 Other long term (current) drug therapy: Secondary | ICD-10-CM | POA: Diagnosis not present

## 2022-06-01 DIAGNOSIS — Z7982 Long term (current) use of aspirin: Secondary | ICD-10-CM | POA: Diagnosis not present

## 2022-06-01 DIAGNOSIS — Z21 Asymptomatic human immunodeficiency virus [HIV] infection status: Secondary | ICD-10-CM | POA: Diagnosis not present

## 2022-06-01 DIAGNOSIS — R001 Bradycardia, unspecified: Secondary | ICD-10-CM | POA: Diagnosis not present

## 2022-06-01 DIAGNOSIS — I509 Heart failure, unspecified: Secondary | ICD-10-CM | POA: Diagnosis not present

## 2022-06-01 DIAGNOSIS — N184 Chronic kidney disease, stage 4 (severe): Secondary | ICD-10-CM | POA: Diagnosis not present

## 2022-06-01 DIAGNOSIS — J449 Chronic obstructive pulmonary disease, unspecified: Secondary | ICD-10-CM | POA: Diagnosis not present

## 2022-06-01 DIAGNOSIS — I130 Hypertensive heart and chronic kidney disease with heart failure and stage 1 through stage 4 chronic kidney disease, or unspecified chronic kidney disease: Secondary | ICD-10-CM | POA: Insufficient documentation

## 2022-06-01 DIAGNOSIS — I10 Essential (primary) hypertension: Secondary | ICD-10-CM

## 2022-06-01 DIAGNOSIS — R030 Elevated blood-pressure reading, without diagnosis of hypertension: Secondary | ICD-10-CM | POA: Diagnosis present

## 2022-06-01 LAB — CBC WITH DIFFERENTIAL/PLATELET
Abs Immature Granulocytes: 0.01 10*3/uL (ref 0.00–0.07)
Basophils Absolute: 0 10*3/uL (ref 0.0–0.1)
Basophils Relative: 1 %
Eosinophils Absolute: 0.3 10*3/uL (ref 0.0–0.5)
Eosinophils Relative: 4 %
HCT: 31.6 % — ABNORMAL LOW (ref 39.0–52.0)
Hemoglobin: 10.7 g/dL — ABNORMAL LOW (ref 13.0–17.0)
Immature Granulocytes: 0 %
Lymphocytes Relative: 33 %
Lymphs Abs: 2.4 10*3/uL (ref 0.7–4.0)
MCH: 31.8 pg (ref 26.0–34.0)
MCHC: 33.9 g/dL (ref 30.0–36.0)
MCV: 94 fL (ref 80.0–100.0)
Monocytes Absolute: 0.5 10*3/uL (ref 0.1–1.0)
Monocytes Relative: 7 %
Neutro Abs: 3.9 10*3/uL (ref 1.7–7.7)
Neutrophils Relative %: 55 %
Platelets: 301 10*3/uL (ref 150–400)
RBC: 3.36 MIL/uL — ABNORMAL LOW (ref 4.22–5.81)
RDW: 14 % (ref 11.5–15.5)
WBC: 7.1 10*3/uL (ref 4.0–10.5)
nRBC: 0 % (ref 0.0–0.2)

## 2022-06-01 LAB — BASIC METABOLIC PANEL
Anion gap: 4 — ABNORMAL LOW (ref 5–15)
BUN: 20 mg/dL (ref 8–23)
CO2: 22 mmol/L (ref 22–32)
Calcium: 8.5 mg/dL — ABNORMAL LOW (ref 8.9–10.3)
Chloride: 110 mmol/L (ref 98–111)
Creatinine, Ser: 1.99 mg/dL — ABNORMAL HIGH (ref 0.61–1.24)
GFR, Estimated: 37 mL/min — ABNORMAL LOW (ref >60)
Glucose, Bld: 93 mg/dL (ref 70–99)
Potassium: 3.9 mmol/L (ref 3.5–5.1)
Sodium: 136 mmol/L (ref 135–145)

## 2022-06-01 LAB — TROPONIN I (HIGH SENSITIVITY): Troponin I (High Sensitivity): 39 ng/L — ABNORMAL HIGH (ref <18)

## 2022-06-01 NOTE — ED Triage Notes (Signed)
Seen at pain clinic this morning and they performed an EKG and told pt to come to ER for abnormalities. Pt denies chest pain, SOB, dizziness. Pt was hypertensive at clinic and given a blood pressure medication, pt is unsure what it was. Pt states he is on medications for blood pressure at home and has not missed any doses.

## 2022-06-01 NOTE — ED Provider Notes (Signed)
[place] EMERGENCY DEPARTMENT AT [facility] Provider Note   CSN: [id number] Arrival date & time: 06/01/22  1140     History  Chief Complaint  Patient presents with   Abnormal ECG   Hypertension    Kevin [name] is a 67 y.o. male.  Pt is a 67y/o male with hx of HIV/AIDS, chronic lower back pain, GERD, COPD, hyperlipidemia, heart failure, resistant hypertension, cocaine use, CKD 4 who is presenting today from the pain clinic due to elevated blood pressure and abnormal EKG.  Patient reports he is not sure why he is here today.  He went to his pain clinic at [facility] because he gets oxycodone there for his chronic back pain and he reports while he was there his blood pressure was elevated and he had an abnormal EKG and they sent him here.  He denies any chest pain or new shortness of breath today.  He has COPD and chronic shortness of breath but nothing out of the ordinary.  He states on Tuesday he had a brief episode of some pricking pain in his chest but it did not last and subsided.  He has not had any since that time.  He is compliant with his medications and took his valsartan today but has not taken his Coreg.  He was given a blood pressure pill at the pain management office but does not know what he received.  He currently has no complaints.  The history is provided by the patient and medical records.  Hypertension       Home Medications Prior to Admission medications   Medication Sig Start Date End Date Taking? Authorizing Provider  albuterol (VENTOLIN HFA) 108 (90 Base) MCG/ACT inhaler Inhale 2 puffs into the lungs every 6 (six) hours as needed for wheezing or shortness of breath. 05/03/22   [name], MD  aspirin EC 81 MG tablet Take 1 tablet (81 mg total) by mouth daily. Swallow whole. 04/28/22   [name], MD  atorvastatin (LIPITOR) 40 MG tablet Take 1 tablet (40 mg total) by mouth daily. 04/28/22   [name], MD  carvedilol (COREG)  12.5 MG tablet Take 1 tablet (12.5 mg total) by mouth 2 (two) times daily with a meal. 04/28/22 04/28/23  [name], MD  dolutegravir-lamiVUDine (DOVATO) 50-300 MG tablet Take 1 tablet by mouth daily. 08/08/21   Randall Hiss, MD  fluticasone-salmeterol (ADVAIR HFA) [phone] MCG/ACT inhaler Inhale 2 puffs into the lungs 2 (two) times daily. 05/03/22 08/01/22  [name], MD  ipratropium-albuterol (DUONEB) 0.5-2.5 (3) MG/3ML SOLN Take 3 mLs by nebulization every 6 (six) hours as needed. 11/12/21   White, Elita Boone, NP  valsartan (DIOVAN) 160 MG tablet Take 1 tablet (160 mg total) by mouth daily. 05/01/22 07/30/22  [name], MD      Allergies    Patient has no known allergies.    Review of Systems   Review of Systems  Physical Exam Updated Vital Signs BP (!) 167/101   Pulse (!) 41   Temp 97.6 F (36.4 C) (Oral)   Resp 15   Ht 6' (1.829 m)   Wt 72.6 kg   SpO2 100%   BMI 21.70 kg/m  Physical Exam Vitals and nursing note reviewed.  Constitutional:      General: He is not in acute distress.    Appearance: He is well-developed.  HENT:     Head: Normocephalic and atraumatic.  Eyes:  Conjunctiva/sclera: Conjunctivae normal.     Pupils: Pupils are equal, round, and reactive to light.  Cardiovascular:     Rate and Rhythm: Regular rhythm. Bradycardia present.     Heart sounds: No murmur heard. Pulmonary:     Effort: Pulmonary effort is normal. No respiratory distress.     Breath sounds: Normal breath sounds. No wheezing or rales.  Abdominal:     General: There is no distension.     Palpations: Abdomen is soft.     Tenderness: There is no abdominal tenderness. There is no guarding or rebound.  Musculoskeletal:        General: No tenderness. Normal range of motion.     Cervical back: Normal range of motion and neck supple.  Skin:    General: Skin is warm and dry.     Findings: No erythema or rash.  Neurological:     Mental Status: He is alert and  oriented to person, place, and time.  Psychiatric:        Behavior: Behavior normal.     ED Results / Procedures / Treatments   Labs (all labs ordered are listed, but only abnormal results are displayed) Labs Reviewed  CBC WITH DIFFERENTIAL/PLATELET - Abnormal; Notable for the following components:      Result Value   RBC 3.36 (*)    Hemoglobin 10.7 (*)    HCT 31.6 (*)    All other components within normal limits  BASIC METABOLIC PANEL - Abnormal; Notable for the following components:   Creatinine, Ser 1.99 (*)    Calcium 8.5 (*)    GFR, Estimated 37 (*)    Anion gap 4 (*)    All other components within normal limits  TROPONIN I (HIGH SENSITIVITY) - Abnormal; Notable for the following components:   Troponin I (High Sensitivity) 39 (*)    All other components within normal limits    EKG EKG Interpretation  Date/Time:  Thursday June 01 2022 11:50:33 EDT Ventricular Rate:  47 PR Interval:  218 QRS Duration: 97 QT Interval:  496 QTC Calculation: 439 R Axis:   42 Text Interpretation: Sinus bradycardia Left ventricular hypertrophy Anterior Q waves, possibly due to LVH Abnormal T, No significant change since last tracing Confirmed by Gwyneth Sprout ([id number]) on 06/01/2022 12:19:39 PM  Radiology DG Chest Port 1 View  Result Date: 06/01/2022 CLINICAL DATA:  Abnormal EKG EXAM: PORTABLE CHEST 1 VIEW COMPARISON:  Chest x-ray 05/08/2022 FINDINGS: No consolidation, pneumothorax or effusion. Normal cardiopericardial silhouette without edema. Overlapping cardiac leads. The extreme inferior costophrenic angles are clipped off the edge of the film. IMPRESSION: No acute cardiopulmonary disease Electronically Signed   By: Karen Kays M.D.   On: 06/01/2022 13:47    Procedures Procedures    Medications Ordered in ED Medications - No data to display  ED Course/ Medical Decision Making/ A&P                             Medical Decision Making Amount and/or Complexity of Data  Reviewed Independent Historian: EMS External Data Reviewed: notes.    Details: Pcp  Labs: ordered. Decision-making details documented in ED Course. Radiology: ordered and independent interpretation performed. Decision-making details documented in ED Course. ECG/medicine tests: ordered and independent interpretation performed. Decision-making details documented in ED Course.  Risk Prescription drug management.   Pt with multiple medical problems and comorbidities and presenting today with a complaint that caries a high risk for  morbidity and mortality.  Here today for hypertension and abnormal EKG.  Patient currently has no complaints has been compliant with his blood pressure medication but reports he has had longstanding issues with blood pressure control.  He has been on amlodipine, valsartan, Coreg and 1 other medication in the past.  He is currently on Coreg and valsartan with recent increase in doses a few months ago.  He does report they gave him a blood pressure medication at the office today but is not sure what they gave him.  He otherwise has no complaints.  Exam is normal.  He is not displaying symptoms concerning for hypertensive urgency.  I independently interpreted patient's EKG which is grossly abnormal but based on prior EKGs over the last few months his EKG is unchanged with findings of LVH and deep T wave inversions inferiorly.  These have been present in every EKG for the last several months.  Patient is bradycardic today with heart rate in the 40s.  Looking back over the charts heart rate typically is in the 50s and 60s.  He reports he has not taken his carvedilol today and at the pain clinic his heart rate was also in the 40s.  This is sinus rhythm there is no evidence of dysrhythmia at this time.  Patient has not had any syncope or dizziness.  Repeat blood pressure here is 155/96.  I independently interpreted patient's labs and CBC with a hemoglobin of 10 which is unchanged, BMP  with a creatinine of 1.99 which appears to be in his baseline and troponin of 39 which also appears to be his baseline based on prior test seems to run around 40.  I have independently visualized and interpreted pt's images today.  Chest x-ray without acute findings today.  Will have patient hold his carvedilol due to his bradycardia but will need follow-up with his PCP for further blood pressure control.  At this time does not need any further testing at this time.  Findings discussed with the patient and his daughter who are present at bedside.  He is comfortable with this plan.  Reached out to his doctor as well for follow-up.           Final Clinical Impression(s) / ED Diagnoses Final diagnoses:  Uncontrolled hypertension  Bradycardia    Rx / DC Orders ED Discharge Orders     None         Gwyneth Sprout, MD 06/01/22 1431

## 2022-06-01 NOTE — Discharge Instructions (Addendum)
Hold your coreg until you see your doctor because your heart rate is low today.  Continue taking your valsartan.  If you start having persistent chest pain, any episodes of passing out, confusion or other concerns return to the emergency room.

## 2022-06-02 ENCOUNTER — Other Ambulatory Visit: Payer: Medicaid Other | Admitting: *Deleted

## 2022-06-02 NOTE — Transitions of Care (Post Inpatient/ED Visit) (Signed)
   06/02/2022  Name: Kevin [name] MRN: [id number] DOB: 21-Jun-1955  Today's TOC FU Call Status: Today's TOC FU Call Status:: Successful TOC FU Call Competed TOC FU Call Complete Date: 06/02/22  Transition Care Management Follow-up Telephone Call Date of Discharge: 06/01/22 Discharge Facility: [facility]) Type of Discharge: Emergency Department Reason for ED Visit: Other: (HTN and abn ECG) How have you been since you were released from the hospital?: Better Any questions or concerns?: No  Items Reviewed: Did you receive and understand the discharge instructions provided?: Yes Medications obtained and verified?: Yes (Medications Reviewed) Any new allergies since your discharge?: No Dietary orders reviewed?: NA Do you have support at home?: Yes People in Home: significant other, child(ren), dependent Name of Support/Comfort Primary Source: Kevin [name]/SO  Home Care and Equipment/Supplies: Were Home Health Services Ordered?: NA Any new equipment or medical supplies ordered?: NA  Functional Questionnaire: Do you need assistance with bathing/showering or dressing?: No Do you need assistance with meal preparation?: No Do you need assistance with eating?: No Do you have difficulty maintaining continence: No Do you need assistance with getting out of bed/getting out of a chair/moving?: No Do you have difficulty managing or taking your medications?: No  Follow up appointments reviewed: PCP Follow-up appointment confirmed?: No Specialist Hospital Follow-up appointment confirmed?: NA Do you understand care options if your condition(s) worsen?: Yes-patient verbalized understanding  SDOH Interventions Today    Flowsheet Row Most Recent Value  SDOH Interventions   Housing Interventions Intervention Not Indicated  Transportation Interventions Intervention Not Indicated      Patient will call today to schedule follow up with PCP.  [name] RN, BSN [place]  Managed  [facility] RN Care Coordinator [phone]

## 2022-06-20 ENCOUNTER — Other Ambulatory Visit (HOSPITAL_COMMUNITY): Payer: Self-pay

## 2022-06-23 ENCOUNTER — Other Ambulatory Visit (HOSPITAL_COMMUNITY): Payer: Self-pay

## 2022-07-04 ENCOUNTER — Encounter: Payer: Self-pay | Admitting: *Deleted

## 2022-07-04 ENCOUNTER — Other Ambulatory Visit: Payer: Medicaid Other | Admitting: *Deleted

## 2022-07-04 NOTE — Patient Outreach (Signed)
Medicaid Managed Care   Nurse Care Manager Note  07/04/2022 Name:  Kevin [name] MRN:  [id number] DOB:  [birth year]  Kevin [name] is an 67 y.o. year old male who is a primary patient of Kevin Gunner, MD.  The [facility] Managed Care Coordination team was consulted for assistance with:    COPD  [name] was given information about Medicaid Managed Care Coordination team services today. Kevin [name] Patient agreed to services and verbal consent obtained.  Engaged with patient by telephone for follow up visit in response to provider referral for case management and/or care coordination services.   Assessments/Interventions:  Review of past medical history, allergies, medications, health status, including review of consultants reports, laboratory and other test data, was performed as part of comprehensive evaluation and provision of chronic care management services.  SDOH (Social Determinants of Health) assessments and interventions performed: SDOH Interventions    Flowsheet Row Patient Outreach Telephone from 07/04/2022 in [place] POPULATION HEALTH DEPARTMENT Patient Outreach Telephone from 06/02/2022 in [place] POPULATION HEALTH DEPARTMENT Patient Outreach Telephone from 05/04/2022 in [place] POPULATION HEALTH DEPARTMENT Patient Outreach Telephone from 05/03/2022 in [place] POPULATION HEALTH DEPARTMENT  SDOH Interventions      Food Insecurity Interventions -- -- -- Intervention Not Indicated  Housing Interventions -- Intervention Not Indicated -- --  Transportation Interventions Payor Benefit  [Provided with [phone]] Intervention Not Indicated -- Payor Benefit  [employer] [phone]  Stress Interventions -- -- [employer], Provide Counseling --       Care Plan  No Known Allergies  Medications Reviewed Today     Reviewed by [name], RN (Registered Nurse) on 07/04/22 at 1055  Med List  Status: <None>   Medication Order Taking? Sig Documenting Provider Last Dose Status Informant  albuterol (VENTOLIN HFA) 108 (90 Base) MCG/ACT inhaler [id number] Yes Inhale 2 puffs into the lungs every 6 (six) hours as needed for wheezing or shortness of breath. Kevin Gunner, MD Taking Active   amLODipine (NORVASC) 10 MG tablet [id number] Yes Take 10 mg by mouth daily. [provider] Taking Active   aspirin EC 81 MG tablet [id number] No Take 1 tablet (81 mg total) by mouth daily. Swallow whole.  Patient not taking: Reported on 07/04/2022   Kevin Gunner, MD Not Taking Active   atorvastatin (LIPITOR) 40 MG tablet [id number] No Take 1 tablet (40 mg total) by mouth daily.  Patient not taking: Reported on 06/02/2022   Kevin Gunner, MD Not Taking Active            Med Note [name], Jiyah Torpey A   Tue Jul 04, 2022 10:55 AM) Patient will start taking today  carvedilol (COREG) 12.5 MG tablet [id number] No Take 1 tablet (12.5 mg total) by mouth 2 (two) times daily with a meal.  Patient not taking: Reported on 06/02/2022   Kevin Gunner, MD Not Taking Active            Med Note (Baelynn Schmuhl A   Fri Jun 02, 2022 10:23 AM) Instructed to stop taking during ED visit  dolutegravir-lamiVUDine (DOVATO) 50-300 MG tablet [id number] Yes Take 1 tablet by mouth daily. Kevin [name], Kevin Grinder, MD Taking Active Self, Pharmacy Records  FEROSUL 325 [phone] Fe) MG tablet [id number] Yes Take 325 mg by mouth daily. [provider] Taking Active   fluticasone-salmeterol (ADVAIR HFA) 166-06 MCG/ACT inhaler [id number] Yes Inhale 2 puffs into the lungs 2 (two) times  daily. Kevin Gunner, MD Taking Active   ipratropium-albuterol (DUONEB) 0.5-2.5 (3) MG/3ML SOLN [id number] No Take 3 mLs by nebulization every 6 (six) hours as needed.  Patient not taking: Reported on 06/02/2022   [name], NP Not Taking Active Self, Pharmacy Records  oxyCODONE-acetaminophen (PERCOCET) 7.5-325 MG tablet [id number] Yes  Take 1 tablet by mouth 3 (three) times daily. [provider] Taking Active   valsartan (DIOVAN) 160 MG tablet [id number] Yes Take 1 tablet (160 mg total) by mouth daily. Kevin Gunner, MD Taking Active             Patient Active Problem List   Diagnosis Date Noted   Chronic low back pain 04/29/2022   Chronic heart failure with preserved ejection fraction (HFpEF) (HCC) 12/27/2021   HLD (hyperlipidemia) 12/27/2021   Chest pain 12/27/2021   Elevated troponin 12/27/2021   Acute diastolic heart failure (HCC) 07/14/2021   Right renal mass 06/29/2021   Renal lesion 06/29/2021   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   Hypertensive crisis    Acute respiratory failure with hypoxia (HCC)    Dyspnea 04/04/2019   Callus of foot 07/10/2018   Hypertensive urgency 08/15/2017   Coronary artery disease involving native coronary artery of native heart with unstable angina pectoris (HCC) 10/19/2016   Easy bruising 08/11/2016   Claudication in peripheral vascular disease (HCC) 06/05/2016   Stage 3 chronic kidney disease (HCC)    Peripheral arterial disease (HCC) 05/09/2016   Acute on chronic diastolic CHF (congestive heart failure), NYHA class 3 (HCC) 03/24/2016   OSA (obstructive sleep apnea) 03/24/2016   Essential hypertension 02/18/2016   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   Shortness of breath 02/07/2016   PAF (paroxysmal atrial fibrillation) (HCC) 02/07/2016   Tobacco abuse 02/07/2016   Acute on chronic renal insufficiency 02/07/2016   COPD exacerbation (HCC) 02/07/2016   Hypertensive emergency 02/07/2016   HIV disease (HCC) 08/27/2004    Conditions to be addressed/monitored per PCP order:  COPD  Care Plan : RN Care Manager Plan of Care  Updates made by [name], RN since 07/04/2022 12:00 AM     Problem: Health Management needs related to COPD      Long-Range Goal: Development of Plan of Care to address Health Management needs related to  COPD   Start Date: 05/03/2022  Expected End Date: 08/01/2022  Note:   Current Barriers:  Chronic Disease Management support and education needs related to COPD  RNCM Clinical Goal(s):  Patient will verbalize understanding of plan for management of COPD as evidenced by patient reports take all medications exactly as prescribed and will call provider for medication related questions as evidenced by patient reports    attend all scheduled medical appointments:  [facility] on 07/06/22 and 08/02/22 with [facility] as evidenced by provider documentation        through collaboration with RN Care manager, provider, and care team.   Interventions: Inter-disciplinary care team collaboration (see longitudinal plan of care) Evaluation of current treatment plan related to  self management and patient's adherence to plan as established by provider Provided therapeutic listening Provided patient with medical transportation provided by [facility], [employer] [phone] Advised patient to schedule follow with PCP, discussed the importance of PCP follow up   COPD: (Status: Goal on Track (progressing): YES.) Long Term Goal  Reviewed medications with patient, including updating EMR with medications prescribed by [facility] Advised patient to track and manage COPD triggers Provided instruction about proper use  of medications used for management of COPD including inhalers Advised patient to self assesses COPD action plan zone and make appointment with provider if in the yellow zone for 48 hours without improvement Provided education about and advised patient to utilize infection prevention strategies to reduce risk of respiratory infection Discussed the importance of adequate rest and management of fatigue with COPD Assessed social determinant of health barriers Ensured patient received requested inhalers  Patient Goals/Self-Care Activities: Take medications as prescribed   Attend all scheduled provider  appointments Call pharmacy for medication refills 3-7 days in advance of running out of medications Call provider office for new concerns or questions  Work with the social worker to address care coordination needs and will continue to work with the clinical team to address health care and disease management related needs identify and avoid work-related triggers develop a rescue plan develop a new routine to improve sleep get at least 7 to 8 hours of sleep at night       Follow Up:  Patient agrees to Care Plan and Follow-up.  Plan: The Managed Medicaid care management team will reach out to the patient again over the next 30 days.  Date/time of next scheduled RN care management/care coordination outreach:  08/07/22 at 10:30am  [name] RN, BSN [place]  Managed [facility] RN Care Coordinator [phone]

## 2022-07-04 NOTE — Patient Instructions (Signed)
Visit Information  Mr. Welle was given information about Medicaid Managed Care team care coordination services as a part of their Healthy [facility] Medicaid benefit. [name] verbally consented to engagement with the [facility] Managed Care team.   If you are experiencing a medical emergency, please call 911 or report to your local emergency department or urgent care.   If you have a non-emergency medical problem during routine business hours, please contact your provider's office and ask to speak with a nurse.   For questions related to your Healthy [facility] health plan, please call: [phone] or visit the homepage here: [email or web address]  If you would like to schedule transportation through your Healthy [facility] plan, please call the following number at least 2 days in advance of your appointment: [phone]  For information about your ride after you set it up, call Ride Assist at [phone]. Use this number to activate a Will Call pickup, or if your transportation is late for a scheduled pickup. Use this number, too, if you need to make a change or cancel a previously scheduled reservation.  If you need transportation services right away, call [phone]. The after-hours call center is staffed 24 hours to handle ride assistance and urgent reservation requests (including discharges) 365 days a year. Urgent trips include sick visits, hospital discharge requests and life-sustaining treatment.  Call the [facility] Line at [phone], at any time, 24 hours a day, 7 days a week. If you are in danger or need immediate medical attention call 911.  If you would like help to quit smoking, call 1-800-QUIT-NOW ((210)591-8523) OR Espaol: 1-855-Djelo-Ya (4-742-595-6387) o para ms informacin haga clic aqu or Text READY to 564-332 to register via text  Mr. Bonee,   Please see education materials related to COPD and HTN provided  by MyChart link.  Patient verbalizes understanding of instructions and care plan provided today and agrees to view in MyChart. Active MyChart status and patient understanding of how to access instructions and care plan via MyChart confirmed with patient.     Telephone follow up appointment with Managed Medicaid care management team member scheduled for:08/07/22 at 10:30am  [name] RN, BSN [place]  Managed [facility] RN Care Coordinator [phone]   Following is a copy of your plan of care:  Care Plan : RN Care Manager Plan of Care  Updates made by [name], RN since 07/04/2022 12:00 AM     Problem: Health Management needs related to COPD      Long-Range Goal: Development of Plan of Care to address Health Management needs related to COPD   Start Date: 05/03/2022  Expected End Date: 08/01/2022  Note:   Current Barriers:  Chronic Disease Management support and education needs related to COPD  RNCM Clinical Goal(s):  Patient will verbalize understanding of plan for management of COPD as evidenced by patient reports take all medications exactly as prescribed and will call provider for medication related questions as evidenced by patient reports    attend all scheduled medical appointments:  [facility] on 07/06/22 and 08/02/22 with [facility] as evidenced by provider documentation        through collaboration with RN Care manager, provider, and care team.   Interventions: Inter-disciplinary care team collaboration (see longitudinal plan of care) Evaluation of current treatment plan related to  self management and patient's adherence to plan as established by provider Provided therapeutic listening Provided patient with medical transportation provided by [facility] [place] [facility] [phone] Advised patient to  schedule follow with PCP, discussed the importance of PCP follow up   COPD: (Status: Goal on Track (progressing): YES.) Long Term Goal  Reviewed medications with patient,  including updating EMR with medications prescribed by [facility] Advised patient to track and manage COPD triggers Provided instruction about proper use of medications used for management of COPD including inhalers Advised patient to self assesses COPD action plan zone and make appointment with provider if in the yellow zone for 48 hours without improvement Provided education about and advised patient to utilize infection prevention strategies to reduce risk of respiratory infection Discussed the importance of adequate rest and management of fatigue with COPD Assessed social determinant of health barriers Ensured patient received requested inhalers  Patient Goals/Self-Care Activities: Take medications as prescribed   Attend all scheduled provider appointments Call pharmacy for medication refills 3-7 days in advance of running out of medications Call provider office for new concerns or questions  Work with the social worker to address care coordination needs and will continue to work with the clinical team to address health care and disease management related needs identify and avoid work-related triggers develop a rescue plan develop a new routine to improve sleep get at least 7 to 8 hours of sleep at night

## 2022-07-06 ENCOUNTER — Ambulatory Visit (INDEPENDENT_AMBULATORY_CARE_PROVIDER_SITE_OTHER): Payer: Medicaid Other | Admitting: Infectious Disease

## 2022-07-06 ENCOUNTER — Other Ambulatory Visit: Payer: Self-pay

## 2022-07-06 ENCOUNTER — Encounter: Payer: Self-pay | Admitting: Infectious Disease

## 2022-07-06 ENCOUNTER — Other Ambulatory Visit (HOSPITAL_COMMUNITY): Payer: Self-pay

## 2022-07-06 VITALS — BP 153/112 | HR 70 | Resp 16 | Ht 72.0 in | Wt 156.0 lb

## 2022-07-06 DIAGNOSIS — I10 Essential (primary) hypertension: Secondary | ICD-10-CM

## 2022-07-06 DIAGNOSIS — I739 Peripheral vascular disease, unspecified: Secondary | ICD-10-CM

## 2022-07-06 DIAGNOSIS — I110 Hypertensive heart disease with heart failure: Secondary | ICD-10-CM | POA: Diagnosis not present

## 2022-07-06 DIAGNOSIS — B20 Human immunodeficiency virus [HIV] disease: Secondary | ICD-10-CM

## 2022-07-06 DIAGNOSIS — I5032 Chronic diastolic (congestive) heart failure: Secondary | ICD-10-CM

## 2022-07-06 DIAGNOSIS — F17290 Nicotine dependence, other tobacco product, uncomplicated: Secondary | ICD-10-CM

## 2022-07-06 DIAGNOSIS — D179 Benign lipomatous neoplasm, unspecified: Secondary | ICD-10-CM

## 2022-07-06 DIAGNOSIS — D170 Benign lipomatous neoplasm of skin and subcutaneous tissue of head, face and neck: Secondary | ICD-10-CM

## 2022-07-06 HISTORY — DX: Benign lipomatous neoplasm, unspecified: D17.9

## 2022-07-06 MED ORDER — DOVATO 50-300 MG PO TABS
1.0000 | ORAL_TABLET | Freq: Every day | ORAL | 11 refills | Status: DC
  Filled 2022-07-06 – 2022-07-14 (×2): qty 30, 30d supply, fill #0
  Filled 2022-08-14: qty 30, 30d supply, fill #1
  Filled 2022-09-07: qty 30, 30d supply, fill #2
  Filled 2022-10-05 – 2022-10-10 (×5): qty 30, 30d supply, fill #3
  Filled 2022-11-01: qty 30, 30d supply, fill #4
  Filled 2022-12-04 – 2022-12-05 (×2): qty 30, 30d supply, fill #5
  Filled 2023-01-25 – 2023-01-26 (×3): qty 30, 30d supply, fill #6
  Filled 2023-04-06 – 2023-04-10 (×4): qty 30, 30d supply, fill #7
  Filled 2023-04-19 – 2023-05-08 (×3): qty 30, 30d supply, fill #8
  Filled 2023-06-01: qty 30, 30d supply, fill #9
  Filled 2023-06-22 – 2023-06-26 (×2): qty 30, 30d supply, fill #10
  Filled ????-??-??: fill #8

## 2022-07-06 NOTE — Progress Notes (Signed)
derm  Subjective:  Chief complaint: followup for HIV disease with some concerns re what appears to be a lipoma on left forehead   Patient ID: Kevin [name], male    DOB: Apr 19, 1955, 67 y.o.   MRN: [id number]  HPI  Young is a 67 year-old black man who has HIV infection that has been well controlled on Biktarvy, with comorbid chronic diastolic CHF chronic kidney disease COPD peripheral artery disease and paroxysmal atrial fibrillation who was recently diagnosed by MRIs having a likely papillary renal cell carcinoma.  He is sp cryoablation of this kidney.  In anticipation of him losing the kidney we decided to get an offer alafenamide of his regimen and switch him over to Dovato.   Been highly adherent to this medication and viral load is remained suppressed.  He is concerned re lesion on forehead.    Past Medical History:  Diagnosis Date   AIDS (acquired immune deficiency syndrome) (HCC) 08/17/2016   Chronic diastolic CHF (congestive heart failure), NYHA class 3 (HCC) 01/2016   Chronic lower back pain    CKD (chronic kidney disease), stage III (HCC)    COPD (chronic obstructive pulmonary disease) (HCC)    Gout    "forearms, hands, ankles, feet" (06/05/2016)   Headache    "weekly" (06/05/2016)   Heart murmur    Hypertension    Hypertensive crisis 08/15/2017   OSA on CPAP    PAD (peripheral artery disease) (HCC)    PAF (paroxysmal atrial fibrillation) (HCC) 01/2016   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    Past Surgical History:  Procedure Laterality Date   IR RADIOLOGIST EVAL & MGMT  05/31/2021   IR RADIOLOGIST EVAL & MGMT  07/26/2021   LEFT HEART CATH AND CORONARY ANGIOGRAPHY N/A 10/23/2016   Procedure: LEFT HEART CATH AND CORONARY ANGIOGRAPHY;  Surgeon: Marykay Lex, MD;  Location: [facility] INVASIVE CV LAB;  Service: Cardiovascular;  Laterality: N/A;   LOWER EXTREMITY ANGIOGRAPHY N/A 07/17/2016   Procedure: Lower Extremity Angiography;  Surgeon: Runell Gess, MD;   Location: [facility] INVASIVE CV LAB;  Service: Cardiovascular;  Laterality: N/A;   LOWER EXTREMITY INTERVENTION N/A 06/05/2016   Procedure: Lower Extremity Intervention;  Surgeon: Runell Gess, MD;  Location: [facility] INVASIVE CV LAB;  Service: Cardiovascular;  Laterality: N/A;   PERIPHERAL VASCULAR ATHERECTOMY Right 07/17/2016   Procedure: Peripheral Vascular Atherectomy;  Surgeon: Runell Gess, MD;  Location: [facility] INVASIVE CV LAB;  Service: Cardiovascular;  Laterality: Right;  SFA   PERIPHERAL VASCULAR INTERVENTION  06/05/2016   Procedure: Peripheral Vascular Intervention;  Surgeon: Runell Gess, MD;  Location: [facility] INVASIVE CV LAB;  Service: Cardiovascular;;  left SFA   RADIOLOGY WITH ANESTHESIA Right 06/29/2021   Procedure: RIGHT RENAL CRYOABLATION;  Surgeon: Richarda Overlie, MD;  Location: [facility];  Service: Anesthesiology;  Laterality: Right;    Family History  Problem Relation Age of Onset   High blood pressure Mother    Lupus Mother       Social History   Socioeconomic History   Marital status: Significant Other    Spouse name: Not on file   Number of children: Not on file   Years of education: Not on file   Highest education level: Not on file  Occupational History   Not on file  Tobacco Use   Smoking status: Every Day    Packs/day: 0.10    Years: 42.00    Additional pack years: 0.00    Total pack years: 4.20  Types: Cigarettes, E-cigarettes    Passive exposure: Never   Smokeless tobacco: Never   Tobacco comments:    1-2 cigarettes a day; vaping  Vaping Use   Vaping Use: Every day   Substances: Nicotine, Flavoring  Substance and Sexual Activity   Alcohol use: Not Currently    Alcohol/week: 2.0 standard drinks of alcohol    Types: 2 Cans of beer per week    Comment: rare   Drug use: Not Currently    Comment: rare   Sexual activity: Not Currently    Comment: declined condoms  Other Topics Concern   Not on file  Social History Narrative   Not on file   Social  Determinants of Health   Financial Resource Strain: Not on file  Food Insecurity: No Food Insecurity (05/03/2022)   Hunger Vital Sign    Worried About Running Out of Food in the Last Year: Never true    Ran Out of Food in the Last Year: Never true  Transportation Needs: Unmet Transportation Needs (07/04/2022)   PRAPARE - Administrator, Civil Service (Medical): Yes    Lack of Transportation (Non-Medical): No  Physical Activity: Not on file  Stress: Stress Concern Present (05/04/2022)   [employer] of Occupational Health - Occupational Stress Questionnaire    Feeling of Stress : To some extent  Social Connections: Not on file    No Known Allergies   Current Outpatient Medications:    albuterol (VENTOLIN HFA) 108 (90 Base) MCG/ACT inhaler, Inhale 2 puffs into the lungs every 6 (six) hours as needed for wheezing or shortness of breath., Disp: 18 g, Rfl: 0   amLODipine (NORVASC) 10 MG tablet, Take 10 mg by mouth daily., Disp: , Rfl:    dolutegravir-lamiVUDine (DOVATO) 50-300 MG tablet, Take 1 tablet by mouth daily., Disp: 30 tablet, Rfl: 11   FEROSUL 325 (65 Fe) MG tablet, Take 325 mg by mouth daily., Disp: , Rfl:    fluticasone-salmeterol (ADVAIR HFA) 230-21 MCG/ACT inhaler, Inhale 2 puffs into the lungs 2 (two) times daily., Disp: 12 g, Rfl: 2   oxyCODONE-acetaminophen (PERCOCET) 7.5-325 MG tablet, Take 1 tablet by mouth 3 (three) times daily., Disp: , Rfl:    valsartan (DIOVAN) 160 MG tablet, Take 1 tablet (160 mg total) by mouth daily., Disp: 90 tablet, Rfl: 0   aspirin EC 81 MG tablet, Take 1 tablet (81 mg total) by mouth daily. Swallow whole. (Patient not taking: Reported on 07/04/2022), Disp: 30 tablet, Rfl: 12   atorvastatin (LIPITOR) 40 MG tablet, Take 1 tablet (40 mg total) by mouth daily. (Patient not taking: Reported on 06/02/2022), Disp: 60 tablet, Rfl: 3   carvedilol (COREG) 12.5 MG tablet, Take 1 tablet (12.5 mg total) by mouth 2 (two) times daily with a meal.  (Patient not taking: Reported on 06/02/2022), Disp: 180 tablet, Rfl: 3   ipratropium-albuterol (DUONEB) 0.5-2.5 (3) MG/3ML SOLN, Take 3 mLs by nebulization every 6 (six) hours as needed. (Patient not taking: Reported on 06/02/2022), Disp: 360 mL, Rfl: 1    Review of Systems  Constitutional:  Negative for activity change, appetite change, chills, diaphoresis, fatigue, fever and unexpected weight change.  HENT:  Negative for congestion, rhinorrhea, sinus pressure, sneezing, sore throat and trouble swallowing.   Eyes:  Negative for photophobia and visual disturbance.  Respiratory:  Negative for cough, chest tightness, shortness of breath, wheezing and stridor.   Cardiovascular:  Negative for chest pain, palpitations and leg swelling.  Gastrointestinal:  Negative for abdominal  distention, abdominal pain, anal bleeding, blood in stool, constipation, diarrhea, nausea and vomiting.  Genitourinary:  Negative for difficulty urinating, dysuria, flank pain and hematuria.  Musculoskeletal:  Negative for arthralgias, back pain, gait problem, joint swelling and myalgias.  Skin:  Negative for color change, pallor, rash and wound.  Neurological:  Negative for dizziness, tremors, weakness and light-headedness.  Hematological:  Negative for adenopathy. Does not bruise/bleed easily.  Psychiatric/Behavioral:  Negative for agitation, behavioral problems, confusion, decreased concentration, dysphoric mood and sleep disturbance.        Objective:   Physical Exam Constitutional:      General: He is not in acute distress.    Appearance: Normal appearance. He is well-developed. He is not ill-appearing or diaphoretic.  HENT:     Head: Normocephalic and atraumatic.     Right Ear: Hearing and external ear normal.     Left Ear: Hearing and external ear normal.     Nose: No nasal deformity or rhinorrhea.  Eyes:     General: No scleral icterus.    Conjunctiva/sclera: Conjunctivae normal.     Right eye: Right  conjunctiva is not injected.     Left eye: Left conjunctiva is not injected.     Pupils: Pupils are equal, round, and reactive to light.  Neck:     Vascular: No JVD.  Cardiovascular:     Rate and Rhythm: Normal rate and regular rhythm.     Heart sounds: S1 normal and S2 normal.  Pulmonary:     Effort: Pulmonary effort is normal. No respiratory distress.     Breath sounds: No wheezing.  Abdominal:     General: There is no distension.     Palpations: Abdomen is soft.  Musculoskeletal:        General: Normal range of motion.     Right shoulder: Normal.     Left shoulder: Normal.     Cervical back: Normal range of motion and neck supple.     Right hip: Normal.     Left hip: Normal.     Right knee: Normal.     Left knee: Normal.  Lymphadenopathy:     Head:     Right side of head: No submandibular, preauricular or posterior auricular adenopathy.     Left side of head: No submandibular, preauricular or posterior auricular adenopathy.     Cervical: No cervical adenopathy.     Right cervical: No superficial or deep cervical adenopathy.    Left cervical: No superficial or deep cervical adenopathy.  Skin:    General: Skin is warm and dry.     Coloration: Skin is not pale.     Findings: No abrasion, bruising, ecchymosis, erythema, lesion or rash.     Nails: There is no clubbing.  Neurological:     Mental Status: He is alert and oriented to person, place, and time.     Sensory: No sensory deficit.     Coordination: Coordination normal.     Gait: Gait normal.  Psychiatric:        Attention and Perception: He is attentive.        Mood and Affect: Mood normal.        Speech: Speech normal.        Behavior: Behavior normal. Behavior is cooperative.        Thought Content: Thought content normal.        Judgment: Judgment normal.      Facial area he is concerned about 12/07/2021:  07/06/22:       Assessment & Plan:    HIV disease:  I will add order HIV viral load CD4  count CBC with differential CMP, RPR GC and chlamydia and I will continue  Mariana A Casselman's Biktarvy, Dovato, prescription  Hyperlipidemia: we will check lipid panel and conitnue atorvastatin  CKD sp resection of kidney will recheck C MP   Poorly controlled HTN:  His BP was up yet again and I have counseled him to take his medications in the MORNING prior to coming to healthcare providers and to keep BP log   Vitals:   07/06/22 1112 07/06/22 1122  BP: (!) 183/104 (!) 153/112  Pulse: 70   Resp: 16   SpO2: 98%    Will continue amlodipine and ARB, his beta blocker has been held   Facial lesion: appears c/w lipoma but will refer to Dermatology

## 2022-07-07 LAB — T-HELPER CELLS (CD4) COUNT (NOT AT ARMC)
CD4 % Helper T Cell: 52 % (ref 33–65)
CD4 T Cell Abs: 842 /uL (ref 400–1790)

## 2022-07-08 ENCOUNTER — Ambulatory Visit (INDEPENDENT_AMBULATORY_CARE_PROVIDER_SITE_OTHER): Payer: Medicaid Other

## 2022-07-08 ENCOUNTER — Ambulatory Visit (HOSPITAL_COMMUNITY)
Admission: EM | Admit: 2022-07-08 | Discharge: 2022-07-08 | Disposition: A | Discharge status: Home / Self Care | Payer: Medicaid Other | Attending: Internal Medicine | Admitting: Internal Medicine

## 2022-07-08 ENCOUNTER — Encounter (HOSPITAL_COMMUNITY): Payer: Self-pay

## 2022-07-08 DIAGNOSIS — I10 Essential (primary) hypertension: Secondary | ICD-10-CM

## 2022-07-08 DIAGNOSIS — R0602 Shortness of breath: Secondary | ICD-10-CM

## 2022-07-08 DIAGNOSIS — N1832 Chronic kidney disease, stage 3b: Secondary | ICD-10-CM

## 2022-07-08 DIAGNOSIS — J441 Chronic obstructive pulmonary disease with (acute) exacerbation: Secondary | ICD-10-CM

## 2022-07-08 DIAGNOSIS — D849 Immunodeficiency, unspecified: Secondary | ICD-10-CM

## 2022-07-08 DIAGNOSIS — Z9225 Personal history of immunosupression therapy: Secondary | ICD-10-CM | POA: Diagnosis not present

## 2022-07-08 LAB — COMPLETE METABOLIC PANEL WITH GFR
AG Ratio: 1.4 (calc) (ref 1.0–2.5)
ALT: 4 U/L — ABNORMAL LOW (ref 9–46)
AST: 12 U/L (ref 10–35)
Albumin: 4.2 g/dL (ref 3.6–5.1)
Alkaline phosphatase (APISO): 75 U/L (ref 35–144)
BUN/Creatinine Ratio: 8 (calc) (ref 6–22)
BUN: 17 mg/dL (ref 7–25)
CO2: 24 mmol/L (ref 20–32)
Calcium: 8.5 mg/dL — ABNORMAL LOW (ref 8.6–10.3)
Chloride: 111 mmol/L — ABNORMAL HIGH (ref 98–110)
Creat: 2.03 mg/dL — ABNORMAL HIGH (ref 0.70–1.35)
Globulin: 3 g/dL (calc) (ref 1.9–3.7)
Glucose, Bld: 60 mg/dL — ABNORMAL LOW (ref 65–99)
Potassium: 4 mmol/L (ref 3.5–5.3)
Sodium: 142 mmol/L (ref 135–146)
Total Bilirubin: 0.5 mg/dL (ref 0.2–1.2)
Total Protein: 7.2 g/dL (ref 6.1–8.1)
eGFR: 36 mL/min/{1.73_m2} — ABNORMAL LOW (ref >=60)

## 2022-07-08 LAB — HIV-1 RNA QUANT-NO REFLEX-BLD
HIV 1 RNA Quant: NOT DETECTED Copies/mL
HIV-1 RNA Quant, Log: NOT DETECTED Log cps/mL

## 2022-07-08 LAB — CBC WITH DIFFERENTIAL/PLATELET
Absolute Monocytes: 387 cells/uL (ref 200–950)
Basophils Absolute: 58 cells/uL (ref 0–200)
Basophils Relative: 0.8 %
Eosinophils Absolute: 533 cells/uL — ABNORMAL HIGH (ref 15–500)
Eosinophils Relative: 7.3 %
HCT: 36.5 % — ABNORMAL LOW (ref 38.5–50.0)
Hemoglobin: 11.9 g/dL — ABNORMAL LOW (ref 13.2–17.1)
Lymphs Abs: 1840 cells/uL (ref 850–3900)
MCH: 31.4 pg (ref 27.0–33.0)
MCHC: 32.6 g/dL (ref 32.0–36.0)
MCV: 96.3 fL (ref 80.0–100.0)
MPV: 9.9 fL (ref 7.5–12.5)
Monocytes Relative: 5.3 %
Neutro Abs: 4482 cells/uL (ref 1500–7800)
Neutrophils Relative %: 61.4 %
Platelets: 319 10*3/uL (ref 140–400)
RBC: 3.79 10*6/uL — ABNORMAL LOW (ref 4.20–5.80)
RDW: 13.3 % (ref 11.0–15.0)
Total Lymphocyte: 25.2 %
WBC: 7.3 10*3/uL (ref 3.8–10.8)

## 2022-07-08 LAB — LIPID PANEL
Cholesterol: 184 mg/dL (ref <200)
HDL: 52 mg/dL (ref >=40)
LDL Cholesterol (Calc): 110 mg/dL (calc) — ABNORMAL HIGH
Non-HDL Cholesterol (Calc): 132 mg/dL (calc) — ABNORMAL HIGH (ref <130)
Total CHOL/HDL Ratio: 3.5 (calc) (ref <5.0)
Triglycerides: 116 mg/dL (ref <150)

## 2022-07-08 LAB — RPR: RPR Ser Ql: NONREACTIVE

## 2022-07-08 MED ORDER — DEXAMETHASONE SODIUM PHOSPHATE 10 MG/ML IJ SOLN
INTRAMUSCULAR | Status: AC
  Filled 2022-07-08: qty 1

## 2022-07-08 MED ORDER — IPRATROPIUM-ALBUTEROL 0.5-2.5 (3) MG/3ML IN SOLN
RESPIRATORY_TRACT | Status: AC
  Filled 2022-07-08: qty 3

## 2022-07-08 MED ORDER — DEXAMETHASONE SODIUM PHOSPHATE 10 MG/ML IJ SOLN
10.0000 mg | Freq: Once | INTRAMUSCULAR | Status: AC
  Administered 2022-07-08: 10 mg via INTRAMUSCULAR

## 2022-07-08 MED ORDER — LEVOFLOXACIN 250 MG PO TABS: ORAL_TABLET | ORAL | 0 refills | Status: AC

## 2022-07-08 MED ORDER — IPRATROPIUM-ALBUTEROL 0.5-2.5 (3) MG/3ML IN SOLN
3.0000 mL | Freq: Once | RESPIRATORY_TRACT | Status: AC
  Administered 2022-07-08: 3 mL via RESPIRATORY_TRACT

## 2022-07-08 NOTE — Discharge Instructions (Signed)
Your chest x-ray shows findings consistent with a COPD exacerbation.  Take Levaquin antibiotic 500 mg (2 pills) today, then 250 mg (1 pill) once daily for the next 6 days to cover for bacterial infection.  I gave you a steroid shot in the clinic to help reduce inflammation to your lungs and help you breathe easier.  You may use your rescue inhaler at home or your nebulizer at home every 4-6 hours as needed for cough, shortness of breath, and wheezing.  Please schedule an appointment with your pulmonologist for a soon as possible for further evaluation.  If your symptoms do not improve in the next 12 to 24 hours with use of above medications after visit to urgent care, please go to the nearest emergency room for further evaluation and management of your symptoms.  I hope you feel better!

## 2022-07-08 NOTE — ED Triage Notes (Signed)
SOB and wheezing for 3 days. Patient has history of COPD. Inhaler not helping and starting to have chest pain due to the labored breathing and cough.

## 2022-07-08 NOTE — ED Provider Notes (Signed)
[facility]    CSN: [id number] Arrival date & time: 07/08/22  1256      History   Chief Complaint Chief Complaint  Patient presents with  . Shortness of Breath    HPI Kevin [name] is a 67 y.o. male.    Shortness of Breath   Past Medical History:  Diagnosis Date  . AIDS (acquired immune deficiency syndrome) (HCC) 08/17/2016  . Chronic diastolic CHF (congestive heart failure), NYHA class 3 (HCC) 01/2016  . Chronic lower back pain   . CKD (chronic kidney disease), stage III (HCC)   . COPD (chronic obstructive pulmonary disease) (HCC)   . Gout    "forearms, hands, ankles, feet" (06/05/2016)  . Headache    "weekly" (06/05/2016)  . Heart murmur   . Hypertension   . Hypertensive crisis 08/15/2017  . Lipoma 07/06/2022  . OSA on CPAP   . PAD (peripheral artery disease) (HCC)   . PAF (paroxysmal atrial fibrillation) (HCC) 01/2016  . 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 Lipoma 07/06/2022  . Chronic low back pain 04/29/2022  . Chronic heart failure with preserved ejection fraction (HFpEF) (HCC) 12/27/2021  . HLD (hyperlipidemia) 12/27/2021  . Chest pain 12/27/2021  . Elevated troponin 12/27/2021  . Acute diastolic heart failure (HCC) 07/14/2021  . Right renal mass 06/29/2021  . Renal lesion 06/29/2021  . 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 Hypertensive crisis   . Acute respiratory failure with hypoxia (HCC)   . Dyspnea 04/04/2019  . Callus of foot 07/10/2018  . Hypertensive urgency 08/15/2017  . Coronary artery disease involving native coronary artery of native heart with unstable angina pectoris (HCC) 10/19/2016  . Easy bruising 08/11/2016  . Claudication in peripheral vascular disease (HCC) 06/05/2016  . Stage 3 chronic kidney disease (HCC)   . Peripheral arterial disease (HCC) 05/09/2016  . Acute on chronic diastolic CHF (congestive heart failure), NYHA class 3 (HCC)  03/24/2016  . OSA (obstructive sleep apnea) 03/24/2016  . Essential hypertension 02/18/2016  . 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 Shortness of breath 02/07/2016  . PAF (paroxysmal atrial fibrillation) (HCC) 02/07/2016  . Tobacco abuse 02/07/2016  . Acute on chronic renal insufficiency 02/07/2016  . COPD exacerbation (HCC) 02/07/2016  . Hypertensive emergency 02/07/2016  . HIV disease (HCC) 08/27/2004    Past Surgical History:  Procedure Laterality Date  . IR RADIOLOGIST EVAL & MGMT  05/31/2021  . IR RADIOLOGIST EVAL & MGMT  07/26/2021  . LEFT HEART CATH AND CORONARY ANGIOGRAPHY N/A 10/23/2016   Procedure: LEFT HEART CATH AND CORONARY ANGIOGRAPHY;  Surgeon: Marykay Lex, MD;  Location: [facility] INVASIVE CV LAB;  Service: Cardiovascular;  Laterality: N/A;  . LOWER EXTREMITY ANGIOGRAPHY N/A 07/17/2016   Procedure: Lower Extremity Angiography;  Surgeon: Runell Gess, MD;  Location: [facility] INVASIVE CV LAB;  Service: Cardiovascular;  Laterality: N/A;  . LOWER EXTREMITY INTERVENTION N/A 06/05/2016   Procedure: Lower Extremity Intervention;  Surgeon: Runell Gess, MD;  Location: [facility] INVASIVE CV LAB;  Service: Cardiovascular;  Laterality: N/A;  . PERIPHERAL VASCULAR ATHERECTOMY Right 07/17/2016   Procedure: Peripheral Vascular Atherectomy;  Surgeon: Runell Gess, MD;  Location: [facility] INVASIVE CV LAB;  Service: Cardiovascular;  Laterality: Right;  SFA  . PERIPHERAL VASCULAR INTERVENTION  06/05/2016   Procedure: Peripheral Vascular Intervention;  Surgeon: Runell Gess, MD;  Location: [facility] INVASIVE CV LAB;  Service: Cardiovascular;;  left SFA  .  RADIOLOGY WITH ANESTHESIA Right 06/29/2021   Procedure: RIGHT RENAL CRYOABLATION;  Surgeon: Richarda Overlie, MD;  Location: [facility];  Service: Anesthesiology;  Laterality: Right;       Home Medications    Prior to Admission medications   Medication Sig Start Date End Date Taking? Authorizing Provider  albuterol (VENTOLIN HFA) 108 (90 Base)  MCG/ACT inhaler Inhale 2 puffs into the lungs every 6 (six) hours as needed for wheezing or shortness of breath. 05/03/22  Yes [name], MD  amLODipine (NORVASC) 10 MG tablet Take 10 mg by mouth daily.   Yes [provider]  atorvastatin (LIPITOR) 40 MG tablet Take 1 tablet (40 mg total) by mouth daily. 04/28/22  Yes [name], MD  dolutegravir-lamiVUDine (DOVATO) 50-300 MG tablet Take 1 tablet by mouth daily. 07/06/22  Yes [name], [name], MD  FEROSUL 325 (65 Fe) MG tablet Take 325 mg by mouth daily. 06/01/22  Yes [provider]  fluticasone-salmeterol (ADVAIR HFA) 230-21 MCG/ACT inhaler Inhale 2 puffs into the lungs 2 (two) times daily. 05/03/22 08/01/22 Yes [name], MD  ipratropium-albuterol (DUONEB) 0.5-2.5 (3) MG/3ML SOLN Take 3 mLs by nebulization every 6 (six) hours as needed. 11/12/21  Yes White, Adrienne R, NP  oxyCODONE-acetaminophen (PERCOCET) 7.5-325 MG tablet Take 1 tablet by mouth 3 (three) times daily. 06/22/22  Yes [provider]  valsartan (DIOVAN) 160 MG tablet Take 1 tablet (160 mg total) by mouth daily. 05/01/22 07/30/22 Yes [name], MD  aspirin EC 81 MG tablet Take 1 tablet (81 mg total) by mouth daily. Swallow whole. Patient not taking: Reported on 07/04/2022 04/28/22   [name], MD  carvedilol (COREG) 12.5 MG tablet Take 1 tablet (12.5 mg total) by mouth 2 (two) times daily with a meal. Patient not taking: Reported on 06/02/2022 04/28/22 04/28/23  [name], MD    Family History Family History  Problem Relation Age of Onset  . High blood pressure Mother   . Lupus Mother     Social History Social History   Tobacco Use  . Smoking status: Every Day    Packs/day: 0.10    Years: 42.00    Additional pack years: 0.00    Total pack years: 4.20    Types: Cigarettes, E-cigarettes    Passive exposure: Never  . Smokeless tobacco: Never  . Tobacco comments:    1-2 cigarettes a day; vaping  Vaping Use   . Vaping Use: Every day  . Substances: Nicotine, Flavoring  Substance Use Topics  . Alcohol use: Not Currently    Alcohol/week: 2.0 standard drinks of alcohol    Types: 2 Cans of beer per week    Comment: rare  . Drug use: Not Currently    Comment: rare     Allergies   Patient has no known allergies.   Review of Systems Review of Systems  Respiratory:  Positive for shortness of breath.      Physical Exam Triage Vital Signs ED Triage Vitals  Enc Vitals Group     BP 07/08/22 1331 (!) 214/111     Pulse Rate 07/08/22 1331 66     Resp 07/08/22 1331 (!) 24     Temp 07/08/22 1331 (!) 97.5 F (36.4 C)     Temp Source 07/08/22 1331 Oral     SpO2 07/08/22 1331 95 %     Weight --      Height --      Head Circumference --  Peak Flow --      Pain Score 07/08/22 1329 7     Pain Loc --      Pain Edu? --      Excl. in GC? --    No data found.  Updated Vital Signs BP (!) 214/111 (BP Location: Right Arm)   Pulse 66   Temp (!) 97.5 F (36.4 C) (Oral)   Resp (!) 24   SpO2 95%   Visual Acuity Right Eye Distance:   Left Eye Distance:   Bilateral Distance:    Right Eye Near:   Left Eye Near:    Bilateral Near:     Physical Exam   UC Treatments / Results  Labs (all labs ordered are listed, but only abnormal results are displayed) Labs Reviewed - No data to display  EKG   Radiology DG Chest 2 View  Result Date: 07/08/2022 CLINICAL DATA:  Shortness of breath and cough. EXAM: CHEST - 2 VIEW COMPARISON:  06/01/2022 FINDINGS: Heart size and mediastinal contours are unremarkable. No pleural fluid or edema. Central airway thickening. Lungs appear hyperinflated. No airspace disease. Visualized osseous structures appear intact. IMPRESSION: Central airway thickening compatible with bronchitis or reactive airways disease. Electronically Signed   By: Signa Kell M.D.   On: 07/08/2022 14:12    Procedures Procedures (including critical care time)  Medications  Ordered in UC Medications  dexamethasone (DECADRON) injection 10 mg (10 mg Intramuscular Given 07/08/22 1344)  ipratropium-albuterol (DUONEB) 0.5-2.5 (3) MG/3ML nebulizer solution 3 mL (3 mLs Nebulization Given 07/08/22 1344)    Initial Impression / Assessment and Plan / UC Course  I have reviewed the triage vital signs and the nursing notes.  Pertinent labs & imaging results that were available during my care of the patient were reviewed by me and considered in my medical decision making (see chart for details).       Creatinine clearance   *** Final Clinical Impressions(s) / UC Diagnoses   Final diagnoses:  COPD exacerbation (HCC)  Shortness of breath  Immunosuppression (HCC)  CKD (chronic kidney disease) stage 4, GFR 15-29 ml/min [facility])   Discharge Instructions   None    ED Prescriptions   None    PDMP not reviewed this encounter.

## 2022-07-11 ENCOUNTER — Other Ambulatory Visit: Payer: Self-pay

## 2022-07-14 ENCOUNTER — Encounter (HOSPITAL_COMMUNITY): Payer: Self-pay

## 2022-07-14 ENCOUNTER — Other Ambulatory Visit (HOSPITAL_COMMUNITY): Payer: Self-pay

## 2022-07-14 ENCOUNTER — Other Ambulatory Visit: Payer: Self-pay

## 2022-07-14 ENCOUNTER — Ambulatory Visit (HOSPITAL_COMMUNITY)
Admission: EM | Admit: 2022-07-14 | Discharge: 2022-07-14 | Disposition: A | Discharge status: Home / Self Care | Payer: Medicaid Other

## 2022-07-14 ENCOUNTER — Emergency Department (HOSPITAL_COMMUNITY): Payer: Medicaid Other

## 2022-07-14 ENCOUNTER — Emergency Department (HOSPITAL_COMMUNITY)
Admission: EM | Admit: 2022-07-14 | Discharge: 2022-07-15 | Disposition: A | Discharge status: Home / Self Care | Payer: Medicaid Other | Attending: Emergency Medicine | Admitting: Emergency Medicine

## 2022-07-14 DIAGNOSIS — Z79899 Other long term (current) drug therapy: Secondary | ICD-10-CM | POA: Insufficient documentation

## 2022-07-14 DIAGNOSIS — J441 Chronic obstructive pulmonary disease with (acute) exacerbation: Secondary | ICD-10-CM | POA: Diagnosis not present

## 2022-07-14 DIAGNOSIS — Z7982 Long term (current) use of aspirin: Secondary | ICD-10-CM | POA: Diagnosis not present

## 2022-07-14 DIAGNOSIS — Z7951 Long term (current) use of inhaled steroids: Secondary | ICD-10-CM | POA: Insufficient documentation

## 2022-07-14 DIAGNOSIS — R0602 Shortness of breath: Secondary | ICD-10-CM | POA: Diagnosis present

## 2022-07-14 LAB — TROPONIN I (HIGH SENSITIVITY): Troponin I (High Sensitivity): 33 ng/L — ABNORMAL HIGH (ref <18)

## 2022-07-14 LAB — CBC
HCT: 33.8 % — ABNORMAL LOW (ref 39.0–52.0)
Hemoglobin: 11.3 g/dL — ABNORMAL LOW (ref 13.0–17.0)
MCH: 32 pg (ref 26.0–34.0)
MCHC: 33.4 g/dL (ref 30.0–36.0)
MCV: 95.8 fL (ref 80.0–100.0)
Platelets: 298 10*3/uL (ref 150–400)
RBC: 3.53 MIL/uL — ABNORMAL LOW (ref 4.22–5.81)
RDW: 14.2 % (ref 11.5–15.5)
WBC: 11.8 10*3/uL — ABNORMAL HIGH (ref 4.0–10.5)
nRBC: 0 % (ref 0.0–0.2)

## 2022-07-14 LAB — BRAIN NATRIURETIC PEPTIDE: B Natriuretic Peptide: 43.4 pg/mL (ref 0.0–100.0)

## 2022-07-14 MED ORDER — MAGNESIUM SULFATE 2 GM/50ML IV SOLN
2.0000 g | Freq: Once | INTRAVENOUS | Status: AC
  Administered 2022-07-14: 2 g via INTRAVENOUS
  Filled 2022-07-14: qty 50

## 2022-07-14 MED ORDER — IPRATROPIUM-ALBUTEROL 0.5-2.5 (3) MG/3ML IN SOLN: 3.0000 mL | RESPIRATORY_TRACT | Status: DC

## 2022-07-14 MED ORDER — IPRATROPIUM-ALBUTEROL 0.5-2.5 (3) MG/3ML IN SOLN
3.0000 mL | RESPIRATORY_TRACT | Status: AC
  Administered 2022-07-14 (×3): 3 mL via RESPIRATORY_TRACT
  Filled 2022-07-14: qty 9

## 2022-07-14 MED ORDER — METHYLPREDNISOLONE SODIUM SUCC 125 MG IJ SOLR
125.0000 mg | Freq: Once | INTRAMUSCULAR | Status: AC
  Administered 2022-07-14: 125 mg via INTRAVENOUS
  Filled 2022-07-14: qty 2

## 2022-07-14 MED ORDER — FLUTICASONE-SALMETEROL 230-21 MCG/ACT IN AERO
2.0000 | INHALATION_SPRAY | Freq: Two times a day (BID) | RESPIRATORY_TRACT | 2 refills | Status: DC
Start: 2022-07-14 — End: 2022-07-25

## 2022-07-14 MED ORDER — PREDNISONE 20 MG PO TABS: ORAL_TABLET | ORAL | 0 refills | Status: DC

## 2022-07-14 NOTE — ED Notes (Signed)
[facility] made aware of transport needed to ED 

## 2022-07-14 NOTE — ED Notes (Signed)
EDP Adela Lank informed pt moved from triage room 13 to room 29 for continued care. Care handoff report given to RN Fernanda.

## 2022-07-14 NOTE — ED Notes (Signed)
XR at bedside

## 2022-07-14 NOTE — ED Triage Notes (Signed)
Pt BIB [facility] from [facility]gent Care d/t SOB and CP that started Saturday worsening today. Pt arrives on 4 L nasal cannula, no reported hypoxia. No medications given prior to arrival. Has Hx COPD. Notable labored breathing, accessory muscle use, and audible wheezing. Pt unable to speak in full sentences.

## 2022-07-14 NOTE — ED Notes (Signed)
Patient is being discharged from the Urgent Care and sent to the Emergency Department via [facility]. Per [employer], PA, patient is in need of higher level of care due to shortness of breath, COPD exacerbation. Patient is aware and verbalizes understanding of plan of care.  Vitals:   07/14/22 1959  BP: (!) 196/145  Pulse: 92  Resp: (!) 26  Temp: 98.7 F (37.1 C)  SpO2: 95%

## 2022-07-14 NOTE — ED Provider Notes (Signed)
[place] EMERGENCY DEPARTMENT AT [facility] Provider Note   CSN: [id number] Arrival date & time: 07/14/22  2110     History  Chief Complaint  Patient presents with   Shortness of Breath    Kevin [name] is a 67 y.o. male.  67 yo M with chief complaints of difficulty breathing.  This has been going on for a few days.  Has had some worsening difficulty breathing.  Feels like his COPD.  Not on oxygen at home.  Went to urgent care and was sent here for evaluation.   Shortness of Breath      Home Medications Prior to Admission medications   Medication Sig Start Date End Date Taking? Authorizing Provider  predniSONE (DELTASONE) 20 MG tablet 2 tabs po daily x 4 days 07/14/22  Yes Melene Plan, DO  albuterol (VENTOLIN HFA) 108 (90 Base) MCG/ACT inhaler Inhale 2 puffs into the lungs every 6 (six) hours as needed for wheezing or shortness of breath. 05/03/22   Kevin Gunner, MD  amLODipine (NORVASC) 10 MG tablet Take 10 mg by mouth daily.    [provider]  aspirin EC 81 MG tablet Take 1 tablet (81 mg total) by mouth daily. Swallow whole. Patient not taking: Reported on 07/04/2022 04/28/22   Kevin Gunner, MD  atorvastatin (LIPITOR) 40 MG tablet Take 1 tablet (40 mg total) by mouth daily. 04/28/22   Kevin Gunner, MD  carvedilol (COREG) 12.5 MG tablet Take 1 tablet (12.5 mg total) by mouth 2 (two) times daily with a meal. Patient not taking: Reported on 06/02/2022 04/28/22 04/28/23  Kevin Gunner, MD  dolutegravir-lamiVUDine (DOVATO) 50-300 MG tablet Take 1 tablet by mouth daily. 07/06/22   Kevin Hiss, MD  FEROSUL 325 (65 Fe) MG tablet Take 325 mg by mouth daily. 06/01/22   [provider]  fluticasone-salmeterol (ADVAIR HFA) 230-21 MCG/ACT inhaler Inhale 2 puffs into the lungs 2 (two) times daily. 07/14/22 10/12/22  Melene Plan, DO  ipratropium-albuterol (DUONEB) 0.5-2.5 (3) MG/3ML SOLN Take 3 mLs by nebulization every 6 (six) hours as  needed. 11/12/21   White, Elita Boone, NP  levofloxacin (LEVAQUIN) 250 MG tablet Take 2 tablets (500 mg total) by mouth daily for 1 day, THEN 1 tablet (250 mg total) daily for 6 days. 07/08/22 07/15/22  Kevin Beers, FNP  oxyCODONE-acetaminophen (PERCOCET) 7.5-325 MG tablet Take 1 tablet by mouth 3 (three) times daily. 06/22/22   [provider]  valsartan (DIOVAN) 160 MG tablet Take 1 tablet (160 mg total) by mouth daily. 05/01/22 07/30/22  Kevin Gunner, MD      Allergies    Patient has no known allergies.    Review of Systems   Review of Systems  Respiratory:  Positive for shortness of breath.     Physical Exam Updated Vital Signs BP (!) 202/103 (BP Location: Left Arm)   Pulse 94   Temp 98.5 F (36.9 C) (Oral)   Resp 17   Ht 6' (1.829 m)   Wt 70.8 kg   SpO2 95%   BMI 21.16 kg/m  Physical Exam Vitals and nursing note reviewed.  Constitutional:      Appearance: He is well-developed.  HENT:     Head: Normocephalic and atraumatic.  Eyes:     Pupils: Pupils are equal, round, and reactive to light.  Neck:     Vascular: No JVD.  Cardiovascular:     Rate and Rhythm: Normal rate and regular rhythm.  Heart sounds: No murmur heard.    No friction rub. No gallop.  Pulmonary:     Effort: No respiratory distress.     Breath sounds: Decreased breath sounds and wheezing present.     Comments: Tachypnea with prolonged expiratory effort and expiratory wheezes. Abdominal:     General: There is no distension.     Tenderness: There is no abdominal tenderness. There is no guarding or rebound.  Musculoskeletal:        General: Normal range of motion.     Cervical back: Normal range of motion and neck supple.  Skin:    Coloration: Skin is not pale.     Findings: No rash.  Neurological:     Mental Status: He is alert and oriented to person, place, and time.  Psychiatric:        Behavior: Behavior normal.     ED Results / Procedures / Treatments   Labs (all labs  ordered are listed, but only abnormal results are displayed) Labs Reviewed  CBC - Abnormal; Notable for the following components:      Result Value   WBC 11.8 (*)    RBC 3.53 (*)    Hemoglobin 11.3 (*)    HCT 33.8 (*)    All other components within normal limits  TROPONIN I (HIGH SENSITIVITY) - Abnormal; Notable for the following components:   Troponin I (High Sensitivity) 33 (*)    All other components within normal limits  BRAIN NATRIURETIC PEPTIDE  BASIC METABOLIC PANEL  TROPONIN I (HIGH SENSITIVITY)    EKG EKG Interpretation  Date/Time:  Friday Jul 14 2022 21:21:46 EDT Ventricular Rate:  86 PR Interval:  170 QRS Duration: 80 QT Interval:  356 QTC Calculation: 426 R Axis:   67 Text Interpretation: Normal sinus rhythm Moderate voltage criteria for LVH, may be normal variant ( Sokolow-Lyon , Cornell product ) Septal infarct , age undetermined ST & T wave abnormality, consider inferior ischemia Abnormal ECG flipped t waves in inferior and lateral leads Confirmed by Melene Plan [phone]) on 07/14/2022 9:43:12 PM  Radiology DG Chest Portable 1 View  Result Date: 07/14/2022 CLINICAL DATA:  Chest pain with shortness of breath EXAM: PORTABLE CHEST 1 VIEW COMPARISON:  Chest x-ray 07/08/2022 FINDINGS: The heart size and mediastinal contours are within normal limits. Both lungs are clear. The visualized skeletal structures are unremarkable. IMPRESSION: No active disease. Electronically Signed   By: Darliss Cheney M.D.   On: 07/14/2022 21:45    Procedures Procedures    Medications Ordered in ED Medications  ipratropium-albuterol (DUONEB) 0.5-2.5 (3) MG/3ML nebulizer solution 3 mL (3 mLs Nebulization Given 07/14/22 2133)  magnesium sulfate IVPB 2 g 50 mL (0 g Intravenous Stopped 07/14/22 2143)  methylPREDNISolone sodium succinate (SOLU-MEDROL) 125 mg/2 mL injection 125 mg (125 mg Intravenous Given 07/14/22 2124)    ED Course/ Medical Decision Making/ A&P                              Medical Decision Making Amount and/or Complexity of Data Reviewed Labs: ordered. Radiology: ordered.  Risk Prescription drug management.   67 yo M with a chief complaints of difficulty breathing.  This been going on for couple days.  Feels like his COPD.  Went to urgent care and was sent here for evaluation.  Will give 3 DuoNebs back-to-back Solu-Medrol magnesium and reassess.  Patient has had much better on repeat assessment.  Chest x-ray independently interpreted  by me without focal infiltrate.  Patient's BNP is negative.  Troponin at his baseline.  No significant anemia.  Unfortunately appears that his metabolic panel has hemolyzed.  Awaiting repeat.  I discussed this with the patient.  He would prefer to go home at this time.  Would prefer not to wait for lab work.  He continues to feel better.  His work of breathing has consistently improved.  Will have him follow-up with his family doctor in the office.  11:59 PM:  I have discussed the diagnosis/risks/treatment options with the patient and family.  Evaluation and diagnostic testing in the emergency department does not suggest an emergent condition requiring admission or immediate intervention beyond what has been performed at this time.  They will follow up with PCP. We also discussed returning to the ED immediately if new or worsening sx occur. We discussed the sx which are most concerning (e.g., sudden worsening pain, fever, inability to tolerate by mouth) that necessitate immediate return. Medications administered to the patient during their visit and any new prescriptions provided to the patient are listed below.  Medications given during this visit Medications  ipratropium-albuterol (DUONEB) 0.5-2.5 (3) MG/3ML nebulizer solution 3 mL (3 mLs Nebulization Given 07/14/22 2133)  magnesium sulfate IVPB 2 g 50 mL (0 g Intravenous Stopped 07/14/22 2143)  methylPREDNISolone sodium succinate (SOLU-MEDROL) 125 mg/2 mL injection 125 mg (125 mg  Intravenous Given 07/14/22 2124)     The patient appears reasonably screen and/or stabilized for discharge and I doubt any other medical condition or other [facility] requiring further screening, evaluation, or treatment in the ED at this time prior to discharge.          Final Clinical Impression(s) / ED Diagnoses Final diagnoses:  COPD exacerbation (HCC)    Rx / DC Orders ED Discharge Orders          Ordered    predniSONE (DELTASONE) 20 MG tablet        07/14/22 2320    fluticasone-salmeterol (ADVAIR HFA) 230-21 MCG/ACT inhaler  2 times daily        07/14/22 2320              Melene Plan, DO 07/14/22 2359

## 2022-07-14 NOTE — ED Notes (Signed)
[facility] here to transport pt to ED.  

## 2022-07-14 NOTE — ED Triage Notes (Signed)
SOB and wheezing continued. Patient states struggling to breathe so hard that chest is hurting. Steroid shot given this past weekend helped for a few days.

## 2022-07-14 NOTE — ED Notes (Signed)
Called charge at [facility] ED. Notified of Patient transfer via [facility].

## 2022-07-14 NOTE — Discharge Instructions (Signed)
Use your inhaler every 4 hours(6 puffs) while awake, return for sudden worsening shortness of breath, or if you need to use your inhaler more often.  ° °

## 2022-07-14 NOTE — ED Provider Notes (Signed)
Seen in triage Patient with increased work of breathing and tachypneic Audible wheezing   History of COPD, CHF, Afib, HTN, HIV  BP is 196/145 in clinic. He reports this is "low" for him  Was seen here last week and given steroid injection. Prescribed course of levofloxacin that he didn't pick up. He is here today with worsening shortness of breath causing chest discomfort   Placed on 4L [place]  [facility] called for transport to hospital Shortness of breath increased when ambulating 2-3 steps from chair to stretcher    Kevin [name], Lurena Joiner, Cordelia Poche 07/14/22 2058

## 2022-07-15 LAB — BASIC METABOLIC PANEL
Anion gap: 9 (ref 5–15)
BUN: 23 mg/dL (ref 8–23)
CO2: 20 mmol/L — ABNORMAL LOW (ref 22–32)
Calcium: 8.5 mg/dL — ABNORMAL LOW (ref 8.9–10.3)
Chloride: 109 mmol/L (ref 98–111)
Creatinine, Ser: 2.05 mg/dL — ABNORMAL HIGH (ref 0.61–1.24)
GFR, Estimated: 36 mL/min — ABNORMAL LOW (ref >60)
Glucose, Bld: 102 mg/dL — ABNORMAL HIGH (ref 70–99)
Potassium: 3.7 mmol/L (ref 3.5–5.1)
Sodium: 138 mmol/L (ref 135–145)

## 2022-07-15 LAB — TROPONIN I (HIGH SENSITIVITY): Troponin I (High Sensitivity): 42 ng/L — ABNORMAL HIGH (ref <18)

## 2022-07-19 ENCOUNTER — Other Ambulatory Visit: Payer: Self-pay

## 2022-07-19 ENCOUNTER — Telehealth: Payer: Self-pay | Admitting: Family Medicine

## 2022-07-19 ENCOUNTER — Emergency Department (HOSPITAL_COMMUNITY): Payer: Medicaid Other

## 2022-07-19 ENCOUNTER — Encounter (HOSPITAL_COMMUNITY): Payer: Self-pay

## 2022-07-19 ENCOUNTER — Other Ambulatory Visit (HOSPITAL_COMMUNITY): Payer: Self-pay

## 2022-07-19 ENCOUNTER — Emergency Department (HOSPITAL_COMMUNITY)
Admission: EM | Admit: 2022-07-19 | Discharge: 2022-07-19 | Disposition: A | Discharge status: Home / Self Care | Payer: Medicaid Other | Attending: Emergency Medicine | Admitting: Emergency Medicine

## 2022-07-19 DIAGNOSIS — Z7982 Long term (current) use of aspirin: Secondary | ICD-10-CM | POA: Diagnosis not present

## 2022-07-19 DIAGNOSIS — Z7951 Long term (current) use of inhaled steroids: Secondary | ICD-10-CM | POA: Insufficient documentation

## 2022-07-19 DIAGNOSIS — J441 Chronic obstructive pulmonary disease with (acute) exacerbation: Secondary | ICD-10-CM | POA: Insufficient documentation

## 2022-07-19 DIAGNOSIS — R195 Other fecal abnormalities: Secondary | ICD-10-CM | POA: Diagnosis not present

## 2022-07-19 DIAGNOSIS — K644 Residual hemorrhoidal skin tags: Secondary | ICD-10-CM | POA: Insufficient documentation

## 2022-07-19 DIAGNOSIS — K5900 Constipation, unspecified: Secondary | ICD-10-CM | POA: Insufficient documentation

## 2022-07-19 DIAGNOSIS — R0602 Shortness of breath: Secondary | ICD-10-CM | POA: Diagnosis present

## 2022-07-19 LAB — BASIC METABOLIC PANEL
Anion gap: 7 (ref 5–15)
BUN: 14 mg/dL (ref 8–23)
CO2: 23 mmol/L (ref 22–32)
Calcium: 8.4 mg/dL — ABNORMAL LOW (ref 8.9–10.3)
Chloride: 106 mmol/L (ref 98–111)
Creatinine, Ser: 1.77 mg/dL — ABNORMAL HIGH (ref 0.61–1.24)
GFR, Estimated: 42 mL/min — ABNORMAL LOW (ref >60)
Glucose, Bld: 107 mg/dL — ABNORMAL HIGH (ref 70–99)
Potassium: 3.5 mmol/L (ref 3.5–5.1)
Sodium: 136 mmol/L (ref 135–145)

## 2022-07-19 LAB — CBC
HCT: 34.6 % — ABNORMAL LOW (ref 39.0–52.0)
Hemoglobin: 11.5 g/dL — ABNORMAL LOW (ref 13.0–17.0)
MCH: 31.3 pg (ref 26.0–34.0)
MCHC: 33.2 g/dL (ref 30.0–36.0)
MCV: 94.3 fL (ref 80.0–100.0)
Platelets: 298 10*3/uL (ref 150–400)
RBC: 3.67 MIL/uL — ABNORMAL LOW (ref 4.22–5.81)
RDW: 13.8 % (ref 11.5–15.5)
WBC: 9.8 10*3/uL (ref 4.0–10.5)
nRBC: 0 % (ref 0.0–0.2)

## 2022-07-19 LAB — TROPONIN I (HIGH SENSITIVITY)
Troponin I (High Sensitivity): 37 ng/L — ABNORMAL HIGH (ref <18)
Troponin I (High Sensitivity): 36 ng/L — ABNORMAL HIGH (ref <18)

## 2022-07-19 LAB — TYPE AND SCREEN
ABO/RH(D): O POS
Antibody Screen: NEGATIVE

## 2022-07-19 LAB — POC OCCULT BLOOD, ED: Fecal Occult Bld: POSITIVE — AB

## 2022-07-19 MED ORDER — IRBESARTAN 300 MG PO TABS
150.0000 mg | ORAL_TABLET | Freq: Every day | ORAL | Status: DC
  Administered 2022-07-19: 150 mg via ORAL
  Filled 2022-07-19: qty 1

## 2022-07-19 MED ORDER — AMLODIPINE BESYLATE 5 MG PO TABS
10.0000 mg | ORAL_TABLET | Freq: Once | ORAL | Status: AC
  Administered 2022-07-19: 10 mg via ORAL
  Filled 2022-07-19: qty 2

## 2022-07-19 MED ORDER — IPRATROPIUM-ALBUTEROL 0.5-2.5 (3) MG/3ML IN SOLN
3.0000 mL | Freq: Once | RESPIRATORY_TRACT | Status: AC
  Administered 2022-07-19: 3 mL via RESPIRATORY_TRACT
  Filled 2022-07-19: qty 3

## 2022-07-19 NOTE — Discharge Instructions (Addendum)
You were seen in the emergency department for shortness of breath. There does not appear to be any acute changes in your labs or imaging, but your fecal occult test was positive. This could be due to a hemorrhoid as there was one noted on your exam. I would advise following up with [facility] unless you have acute worsening of your symptoms. Return to the ER if you experience worsening bowel movements with primarily blood in the toiler bowl.

## 2022-07-19 NOTE — ED Triage Notes (Signed)
Pt arrives via pov from home. Pt reports he has had blood in his stool since yesterday. PT reports he has had increased sob as well. Hx of copd. Productive cough. Pt reports chest pain with exertion.

## 2022-07-19 NOTE — ED Provider Notes (Signed)
[place] EMERGENCY DEPARTMENT AT [facility] Provider Note   CSN: [id number] Arrival date & time: 07/19/22  1512     History Chief Complaint  Patient presents with   Shortness of Breath   Blood In Stools   Chest Pain    Kevin [name] is a 67 y.o. male.  Patient presents to the emergency department complaints of shortness of breath, chest pain, blood in stools.  Patient currently takes aspirin.  Reports that he had blood in the toilet bowl 2 times in the last 2 days.  He denies any significant rectal pain.  States that he also has some blood on the toilet paper when he wiped but there was blood in the toilet bowl.  Reports that he has had colonoscopy recently and reports that his last colonoscopy he was advised that he had 17 polyps present at that time.  Denies any significant abdominal pain and no significant change in bowel movements or bowel habits.  Patient was seen here in the emergency department a few days ago for a COPD exacerbation which was managed with DuoNebs and Solu-Medrol which patient responded well to.   Shortness of Breath Associated symptoms: chest pain   Chest Pain Associated symptoms: shortness of breath        Home Medications Prior to Admission medications   Medication Sig Start Date End Date Taking? Authorizing Provider  albuterol (VENTOLIN HFA) 108 (90 Base) MCG/ACT inhaler Inhale 2 puffs into the lungs every 6 (six) hours as needed for wheezing or shortness of breath. 05/03/22   [name], MD  amLODipine (NORVASC) 10 MG tablet Take 10 mg by mouth daily.    [provider]  aspirin EC 81 MG tablet Take 1 tablet (81 mg total) by mouth daily. Swallow whole. Patient not taking: Reported on 07/04/2022 04/28/22   [name], MD  atorvastatin (LIPITOR) 40 MG tablet Take 1 tablet (40 mg total) by mouth daily. 04/28/22   [name], MD  carvedilol (COREG) 12.5 MG tablet Take 1 tablet (12.5 mg total) by mouth 2 (two) times  daily with a meal. Patient not taking: Reported on 06/02/2022 04/28/22 04/28/23  [name], MD  dolutegravir-lamiVUDine (DOVATO) 50-300 MG tablet Take 1 tablet by mouth daily. 07/06/22   Randall Hiss, MD  FEROSUL 325 (65 Fe) MG tablet Take 325 mg by mouth daily. 06/01/22   [provider]  fluticasone-salmeterol (ADVAIR HFA) 230-21 MCG/ACT inhaler Inhale 2 puffs into the lungs 2 (two) times daily. 07/14/22 10/12/22  Melene Plan, DO  ipratropium-albuterol (DUONEB) 0.5-2.5 (3) MG/3ML SOLN Take 3 mLs by nebulization every 6 (six) hours as needed. 11/12/21   White, Elita Boone, NP  oxyCODONE-acetaminophen (PERCOCET) 7.5-325 MG tablet Take 1 tablet by mouth 3 (three) times daily. 06/22/22   [provider]  predniSONE (DELTASONE) 20 MG tablet 2 tabs po daily x 4 days 07/14/22   Melene Plan, DO  valsartan (DIOVAN) 160 MG tablet Take 1 tablet (160 mg total) by mouth daily. 05/01/22 07/30/22  [name], MD      Allergies    Patient has no known allergies.    Review of Systems   Review of Systems  Respiratory:  Positive for shortness of breath.   Cardiovascular:  Positive for chest pain.  Gastrointestinal:  Positive for blood in stool.  All other systems reviewed and are negative.   Physical Exam Updated Vital Signs BP (!) 206/126   Pulse 68  Temp 98.2 F (36.8 C) (Oral)   Resp 16   Ht 6' (1.829 m)   Wt 71.2 kg   SpO2 100%   BMI 21.29 kg/m  Physical Exam Vitals and nursing note reviewed. Exam conducted with a chaperone present.  Constitutional:      General: He is not in acute distress.    Appearance: He is well-developed.  HENT:     Head: Normocephalic and atraumatic.  Eyes:     Conjunctiva/sclera: Conjunctivae normal.  Cardiovascular:     Rate and Rhythm: Normal rate and regular rhythm.     Heart sounds: No murmur heard. Pulmonary:     Effort: Pulmonary effort is normal. No respiratory distress.     Breath sounds: Normal breath sounds.  Abdominal:      Palpations: Abdomen is soft.     Tenderness: There is no abdominal tenderness.  Genitourinary:    Rectum: Guaiac result positive. External hemorrhoid present. No mass.  Musculoskeletal:        General: No swelling.     Cervical back: Neck supple.  Skin:    General: Skin is warm and dry.     Capillary Refill: Capillary refill takes less than 2 seconds.  Neurological:     Mental Status: He is alert.  Psychiatric:        Mood and Affect: Mood normal.     ED Results / Procedures / Treatments   Labs (all labs ordered are listed, but only abnormal results are displayed) Labs Reviewed  BASIC METABOLIC PANEL - Abnormal; Notable for the following components:      Result Value   Glucose, Bld 107 (*)    Creatinine, Ser 1.77 (*)    Calcium 8.4 (*)    GFR, Estimated 42 (*)    All other components within normal limits  CBC - Abnormal; Notable for the following components:   RBC 3.67 (*)    Hemoglobin 11.5 (*)    HCT 34.6 (*)    All other components within normal limits  POC OCCULT BLOOD, ED - Abnormal; Notable for the following components:   Fecal Occult Bld POSITIVE (*)    All other components within normal limits  TROPONIN I (HIGH SENSITIVITY) - Abnormal; Notable for the following components:   Troponin I (High Sensitivity) 37 (*)    All other components within normal limits  TROPONIN I (HIGH SENSITIVITY) - Abnormal; Notable for the following components:   Troponin I (High Sensitivity) 36 (*)    All other components within normal limits  POC OCCULT BLOOD, ED  TYPE AND SCREEN    EKG EKG Interpretation  Date/Time:  Wednesday July 19 2022 15:33:48 EDT Ventricular Rate:  68 PR Interval:  162 QRS Duration: 92 QT Interval:  404 QTC Calculation: 429 R Axis:   71 Text Interpretation: Normal sinus rhythm Moderate voltage criteria for LVH, may be normal variant ( Sokolow-Lyon , Cornell product ) ST & T wave abnormality, consider inferior ischemia  no significant change since  May 2024 Confirmed by Pricilla Loveless [phone]) on 07/19/2022 7:46:56 PM  Radiology DG Chest 2 View  Result Date: 07/19/2022 CLINICAL DATA:  Chest pain, shortness of breath. EXAM: CHEST - 2 VIEW COMPARISON:  Jul 14, 2022. FINDINGS: The heart size and mediastinal contours are within normal limits. Both lungs are clear. The visualized skeletal structures are unremarkable. IMPRESSION: No active cardiopulmonary disease. Electronically Signed   By: Lupita Raider M.D.   On: 07/19/2022 16:16    Procedures Procedures  Medications Ordered in ED Medications  amLODipine (NORVASC) tablet 10 mg (10 mg Oral Given 07/19/22 2035)  ipratropium-albuterol (DUONEB) 0.5-2.5 (3) MG/3ML nebulizer solution 3 mL (3 mLs Nebulization Given 07/19/22 2036)    ED Course/ Medical Decision Making/ A&P                            Medical Decision Making Amount and/or Complexity of Data Reviewed Labs: ordered. Radiology: ordered.  Risk Prescription drug management.   This patient presents to the ED for concern of shortness of breath.  Differential diagnosis includes PE, viral URI, MI, bowel obstruction   Lab Tests:  I Ordered, and personally interpreted labs.  The pertinent results include: CBC with notable anemia, BMP with elevation in creatinine and decreased GFR, troponin 36 which is baseline, Hemoccult positive   Imaging Studies ordered:  I ordered imaging studies including chest x-ray I independently visualized and interpreted imaging which showed no acute cardiopulmonary process I agree with the radiologist interpretation   Medicines ordered and prescription drug management:  I ordered medication including DuoNeb, amlodipine, Norvasc for shortness of breath, hypertension Reevaluation of the patient after these medicines showed that the patient improved I have reviewed the patients home medicines and have made adjustments as needed   Problem List / ED Course:  Patient presents to the emergency  department complaints of shortness of breath, blood in stools and chest pain.  Reports he was seen in the queue days ago for similar symptoms and was diagnosed with a COPD exacerbation at that time.  Shortness of breath is not significantly altered from that time.  However he does report that he had blood in his stools for the last 2 days with bowel movements.  Does report that he feels like he may be straining with his bowel movements and is not typically constipated at baseline. Labs ordered from triage including BMP, CBC, troponin, type and screen.  Patient's labs largely unremarkable and troponin is at baseline with slight elevation at 36.  Will order fecal occult testing to be collected by myself for further evaluation of patient's symptoms. Fecal occult positive but I suspect that this is due to a hemorrhoid that I noted on physical examination.  Given the patient does not have any acute changes in RBC, hemoglobin, hematocrit, I do not believe that patient is suffering from acute blood loss anemia secondary to lower GI bleed.  Patient has colonoscopy and GI follow-up scheduled in a few weeks given patient's lack of concerning symptoms at this time, believe that patient is safe and stable for discharge and outpatient setting with GI.  Encourage patient to return to the emergency department if he has any acute worsening of his symptoms such as worsening rectal bleeding, worsening shortness of breath, headache, chest pain.  Otherwise believe the patient is hemodynamically stable for discharge and follow-up in the outpatient setting.  Patient's blood pressure does remain elevated but appears the patient typically has significant elevation of blood pressure even with blood pressure medications administered here.  Patient would likely benefit from further evaluation of blood pressure with primary care provider/cardiology.  All questions answered prior to patient discharge.  Final Clinical Impression(s) / ED  Diagnoses Final diagnoses:  Constipation, unspecified constipation type  Positive fecal occult blood test  COPD exacerbation (HCC)    Rx / DC Orders ED Discharge Orders     None         Smitty Knudsen, PA-C  07/20/22 2359    Pricilla Loveless, MD 07/25/22 [phone]

## 2022-07-19 NOTE — Telephone Encounter (Signed)
Kevin [name] [phone]  P t was sent to [facility] for having blood in his stool and not feeling well at all.

## 2022-07-20 ENCOUNTER — Telehealth: Payer: Self-pay | Admitting: Family Medicine

## 2022-07-20 NOTE — Telephone Encounter (Signed)
Pt needs a PA for his albuterol (VENTOLIN HFA) 108 (90 Base) MCG/ACT inhaler [[id number]] .

## 2022-07-21 ENCOUNTER — Other Ambulatory Visit (HOSPITAL_COMMUNITY): Payer: Self-pay

## 2022-07-21 NOTE — Telephone Encounter (Signed)
Patient is aware of annotation below and verbalized understanding.  

## 2022-07-21 NOTE — Telephone Encounter (Signed)
PA is not needed. Brand name is covered with a $4.00 co-pay at this time.

## 2022-07-25 ENCOUNTER — Emergency Department (HOSPITAL_COMMUNITY)
Admission: EM | Admit: 2022-07-25 | Discharge: 2022-07-25 | Disposition: A | Discharge status: Home / Self Care | Payer: Medicaid Other | Attending: Emergency Medicine | Admitting: Emergency Medicine

## 2022-07-25 ENCOUNTER — Other Ambulatory Visit: Payer: Self-pay

## 2022-07-25 ENCOUNTER — Emergency Department (HOSPITAL_COMMUNITY): Payer: Medicaid Other

## 2022-07-25 ENCOUNTER — Encounter (HOSPITAL_COMMUNITY): Payer: Self-pay

## 2022-07-25 DIAGNOSIS — R0602 Shortness of breath: Secondary | ICD-10-CM | POA: Diagnosis present

## 2022-07-25 DIAGNOSIS — J441 Chronic obstructive pulmonary disease with (acute) exacerbation: Secondary | ICD-10-CM | POA: Diagnosis not present

## 2022-07-25 DIAGNOSIS — Z7982 Long term (current) use of aspirin: Secondary | ICD-10-CM | POA: Diagnosis not present

## 2022-07-25 DIAGNOSIS — N189 Chronic kidney disease, unspecified: Secondary | ICD-10-CM | POA: Diagnosis not present

## 2022-07-25 DIAGNOSIS — I129 Hypertensive chronic kidney disease with stage 1 through stage 4 chronic kidney disease, or unspecified chronic kidney disease: Secondary | ICD-10-CM | POA: Diagnosis not present

## 2022-07-25 DIAGNOSIS — Z79899 Other long term (current) drug therapy: Secondary | ICD-10-CM | POA: Diagnosis not present

## 2022-07-25 LAB — I-STAT VENOUS BLOOD GAS, ED
Acid-Base Excess: 1 mmol/L (ref 0.0–2.0)
Acid-Base Excess: 1 mmol/L (ref 0.0–2.0)
Bicarbonate: 27.2 mmol/L (ref 20.0–28.0)
Bicarbonate: 27.6 mmol/L (ref 20.0–28.0)
Calcium, Ion: 1.06 mmol/L — ABNORMAL LOW (ref 1.15–1.40)
Calcium, Ion: 1.12 mmol/L — ABNORMAL LOW (ref 1.15–1.40)
HCT: 36 % — ABNORMAL LOW (ref 39.0–52.0)
HCT: 36 % — ABNORMAL LOW (ref 39.0–52.0)
Hemoglobin: 12.2 g/dL — ABNORMAL LOW (ref 13.0–17.0)
Hemoglobin: 12.2 g/dL — ABNORMAL LOW (ref 13.0–17.0)
O2 Saturation: 62 %
O2 Saturation: 72 %
Potassium: 4.3 mmol/L (ref 3.5–5.1)
Potassium: 6.4 mmol/L (ref 3.5–5.1)
Sodium: 139 mmol/L (ref 135–145)
Sodium: 142 mmol/L (ref 135–145)
TCO2: 29 mmol/L (ref 22–32)
TCO2: 29 mmol/L (ref 22–32)
pCO2, Ven: 49.6 mmHg (ref 44–60)
pCO2, Ven: 50.1 mmHg (ref 44–60)
pH, Ven: 7.347 (ref 7.25–7.43)
pH, Ven: 7.348 (ref 7.25–7.43)
pO2, Ven: 35 mmHg (ref 32–45)
pO2, Ven: 41 mmHg (ref 32–45)

## 2022-07-25 LAB — COMPREHENSIVE METABOLIC PANEL
ALT: 16 U/L (ref 0–44)
AST: 25 U/L (ref 15–41)
Albumin: 3.5 g/dL (ref 3.5–5.0)
Alkaline Phosphatase: 70 U/L (ref 38–126)
Anion gap: 9 (ref 5–15)
BUN: 19 mg/dL (ref 8–23)
CO2: 24 mmol/L (ref 22–32)
Calcium: 8.4 mg/dL — ABNORMAL LOW (ref 8.9–10.3)
Chloride: 104 mmol/L (ref 98–111)
Creatinine, Ser: 2.04 mg/dL — ABNORMAL HIGH (ref 0.61–1.24)
GFR, Estimated: 36 mL/min — ABNORMAL LOW (ref >60)
Glucose, Bld: 121 mg/dL — ABNORMAL HIGH (ref 70–99)
Potassium: 4.3 mmol/L (ref 3.5–5.1)
Sodium: 137 mmol/L (ref 135–145)
Total Bilirubin: 1 mg/dL (ref 0.3–1.2)
Total Protein: 6.9 g/dL (ref 6.5–8.1)

## 2022-07-25 LAB — CBC WITH DIFFERENTIAL/PLATELET
Abs Immature Granulocytes: 0.05 10*3/uL (ref 0.00–0.07)
Basophils Absolute: 0 10*3/uL (ref 0.0–0.1)
Basophils Relative: 0 %
Eosinophils Absolute: 1.3 10*3/uL — ABNORMAL HIGH (ref 0.0–0.5)
Eosinophils Relative: 11 %
HCT: 35 % — ABNORMAL LOW (ref 39.0–52.0)
Hemoglobin: 11.6 g/dL — ABNORMAL LOW (ref 13.0–17.0)
Immature Granulocytes: 0 %
Lymphocytes Relative: 29 %
Lymphs Abs: 3.5 10*3/uL (ref 0.7–4.0)
MCH: 32 pg (ref 26.0–34.0)
MCHC: 33.1 g/dL (ref 30.0–36.0)
MCV: 96.4 fL (ref 80.0–100.0)
Monocytes Absolute: 0.9 10*3/uL (ref 0.1–1.0)
Monocytes Relative: 7 %
Neutro Abs: 6.3 10*3/uL (ref 1.7–7.7)
Neutrophils Relative %: 53 %
Platelets: 289 10*3/uL (ref 150–400)
RBC: 3.63 MIL/uL — ABNORMAL LOW (ref 4.22–5.81)
RDW: 14.4 % (ref 11.5–15.5)
WBC: 12.1 10*3/uL — ABNORMAL HIGH (ref 4.0–10.5)
nRBC: 0 % (ref 0.0–0.2)

## 2022-07-25 LAB — TROPONIN I (HIGH SENSITIVITY)
Troponin I (High Sensitivity): 45 ng/L — ABNORMAL HIGH (ref <18)
Troponin I (High Sensitivity): 41 ng/L — ABNORMAL HIGH (ref <18)

## 2022-07-25 LAB — BRAIN NATRIURETIC PEPTIDE: B Natriuretic Peptide: 18.8 pg/mL (ref 0.0–100.0)

## 2022-07-25 MED ORDER — VENTOLIN HFA 108 (90 BASE) MCG/ACT IN AERS: 2.0000 | INHALATION_SPRAY | RESPIRATORY_TRACT | 1 refills | Status: DC | PRN

## 2022-07-25 MED ORDER — ADVAIR DISKUS 500-50 MCG/ACT IN AEPB: 1.0000 | INHALATION_SPRAY | Freq: Two times a day (BID) | RESPIRATORY_TRACT | 0 refills | Status: DC

## 2022-07-25 MED ORDER — PREDNISONE 10 MG PO TABS: ORAL_TABLET | ORAL | 0 refills | Status: AC

## 2022-07-25 MED ORDER — DOXYCYCLINE HYCLATE 100 MG PO CAPS: 100.0000 mg | ORAL_CAPSULE | Freq: Two times a day (BID) | ORAL | 0 refills | Status: DC

## 2022-07-25 MED ORDER — ALBUTEROL SULFATE (2.5 MG/3ML) 0.083% IN NEBU
10.0000 mg/h | INHALATION_SOLUTION | RESPIRATORY_TRACT | Status: DC
  Administered 2022-07-25: 10 mg/h via RESPIRATORY_TRACT
  Filled 2022-07-25: qty 12

## 2022-07-25 MED ORDER — ALBUTEROL SULFATE HFA 108 (90 BASE) MCG/ACT IN AERS
2.0000 | INHALATION_SPRAY | Freq: Once | RESPIRATORY_TRACT | Status: AC
  Administered 2022-07-25: 2 via RESPIRATORY_TRACT
  Filled 2022-07-25: qty 6.7

## 2022-07-25 NOTE — ED Notes (Signed)
Patient able to walk 30 feet with O2 saturation remaining above 95% on room air the entire time. Patient reports feeling out of breath and tired.

## 2022-07-25 NOTE — ED Provider Notes (Signed)
[place] EMERGENCY DEPARTMENT AT [facility] Provider Note   CSN: [id number] Arrival date & time: 07/25/22  0016     History  Chief Complaint  Patient presents with   Respiratory Distress    Patient to ED from home with wife via EMS with complaint of difficulty breathing x 1 hour. Patient received 1 duo neb and 125 solu medrol IV 18 LFA. EMS reports patient was 94% on RA, tripoding with retractions.    Kevin [name] is a 67 y.o. male.  The history is provided by the patient, the EMS personnel and medical records.  Kevin [name] is a 67 y.o. male who presents to the Emergency Department complaining of shortness of breath.  Resents the emergency department by EMS for evaluation of difficulty breathing.  He states he has had difficulty breathing for very long time but this worsened over the last 24 hours.  He states he has been using his breathing treatments at home without significant improvement.  EMS reports that he was tripoding for approximately 1 hour prior to their arrival.  He reports chest tightness, cough productive of sputum for 2 days.  No fevers.  No nausea, vomiting.  He states he is compliant with his home medications but is having trouble getting refills on his inhaler.  EMS treated him with 125 Solu-Medrol as well as a DuoNeb prior to ED arrival.    Home Medications Prior to Admission medications   Medication Sig Start Date End Date Taking? Authorizing Provider  albuterol (VENTOLIN HFA) 108 (90 Base) MCG/ACT inhaler Inhale 2 puffs into the lungs every 4 (four) hours as needed for wheezing or shortness of breath. 07/25/22  Yes Tilden Fossa, MD  amLODipine (NORVASC) 10 MG tablet Take 10 mg by mouth daily.    [provider]  aspirin EC 81 MG tablet Take 1 tablet (81 mg total) by mouth daily. Swallow whole. Patient not taking: Reported on 07/04/2022 04/28/22   [name], MD  atorvastatin (LIPITOR) 40 MG tablet Take 1 tablet (40 mg total) by  mouth daily. 04/28/22   [name], MD  carvedilol (COREG) 12.5 MG tablet Take 1 tablet (12.5 mg total) by mouth 2 (two) times daily with a meal. Patient not taking: Reported on 06/02/2022 04/28/22 04/28/23  [name], MD  dolutegravir-lamiVUDine (DOVATO) 50-300 MG tablet Take 1 tablet by mouth daily. 07/06/22   Randall Hiss, MD  FEROSUL 325 (65 Fe) MG tablet Take 325 mg by mouth daily. 06/01/22   [provider]  fluticasone-salmeterol (ADVAIR DISKUS) 500-50 MCG/ACT AEPB Inhale 1 puff into the lungs in the morning and at bedtime. 07/25/22 10/23/22 Yes Tilden Fossa, MD  ipratropium-albuterol (DUONEB) 0.5-2.5 (3) MG/3ML SOLN Take 3 mLs by nebulization every 6 (six) hours as needed. 11/12/21   White, Elita Boone, NP  oxyCODONE-acetaminophen (PERCOCET) 7.5-325 MG tablet Take 1 tablet by mouth 3 (three) times daily. 06/22/22   [provider]  predniSONE (DELTASONE) 10 MG tablet Take 4 tablets (40 mg total) by mouth daily for 3 days, THEN 3 tablets (30 mg total) daily for 3 days, THEN 2 tablets (20 mg total) daily for 3 days, THEN 1 tablet (10 mg total) daily for 3 days. 07/25/22 08/06/22 Yes Tilden Fossa, MD  valsartan (DIOVAN) 160 MG tablet Take 1 tablet (160 mg total) by mouth daily. 05/01/22 07/30/22  [name], MD      Allergies    Patient has no known allergies.  Review of Systems   Review of Systems  All other systems reviewed and are negative.   Physical Exam Updated Vital Signs BP (!) 174/79   Pulse 91   Temp 97.9 F (36.6 C)   Resp 18   Ht 6' (1.829 m)   Wt 72.6 kg   SpO2 97%   BMI 21.70 kg/m  Physical Exam Vitals and nursing note reviewed.  Constitutional:      Appearance: He is well-developed.  HENT:     Head: Normocephalic and atraumatic.  Cardiovascular:     Rate and Rhythm: Normal rate and regular rhythm.  Pulmonary:     Effort: Pulmonary effort is normal. No respiratory distress.     Comments: Decreased air movement in all  lung fields Abdominal:     Palpations: Abdomen is soft.     Tenderness: There is no abdominal tenderness. There is no guarding or rebound.  Musculoskeletal:        General: No swelling or tenderness.  Skin:    General: Skin is warm and dry.  Neurological:     Mental Status: He is alert and oriented to person, place, and time.  Psychiatric:        Behavior: Behavior normal.     ED Results / Procedures / Treatments   Labs (all labs ordered are listed, but only abnormal results are displayed) Labs Reviewed  COMPREHENSIVE METABOLIC PANEL - Abnormal; Notable for the following components:      Result Value   Glucose, Bld 121 (*)    Creatinine, Ser 2.04 (*)    Calcium 8.4 (*)    GFR, Estimated 36 (*)    All other components within normal limits  CBC WITH DIFFERENTIAL/PLATELET - Abnormal; Notable for the following components:   WBC 12.1 (*)    RBC 3.63 (*)    Hemoglobin 11.6 (*)    HCT 35.0 (*)    Eosinophils Absolute 1.3 (*)    All other components within normal limits  I-STAT VENOUS BLOOD GAS, ED - Abnormal; Notable for the following components:   Potassium 6.4 (*)    Calcium, Ion 1.06 (*)    HCT 36.0 (*)    Hemoglobin 12.2 (*)    All other components within normal limits  TROPONIN I (HIGH SENSITIVITY) - Abnormal; Notable for the following components:   Troponin I (High Sensitivity) 45 (*)    All other components within normal limits  TROPONIN I (HIGH SENSITIVITY) - Abnormal; Notable for the following components:   Troponin I (High Sensitivity) 41 (*)    All other components within normal limits  BRAIN NATRIURETIC PEPTIDE    EKG EKG Interpretation  Date/Time:  Tuesday July 25 2022 00:22:19 EDT Ventricular Rate:  84 PR Interval:  169 QRS Duration: 91 QT Interval:  353 QTC Calculation: 418 R Axis:   61 Text Interpretation: Sinus rhythm Probable left atrial enlargement Left ventricular hypertrophy Anterior infarct, old Abnormal T, consider ischemia, diffuse leads  similar when compared to priors Confirmed by Tilden Fossa [phone]) on 07/25/2022 12:38:06 AM  Radiology DG Chest Portable 1 View  Result Date: 07/25/2022 CLINICAL DATA:  Shortness of breath. EXAM: PORTABLE CHEST 1 VIEW COMPARISON:  July 19, 2022 FINDINGS: The heart size and mediastinal contours are within normal limits. Very mild bilateral infrahilar atelectatic changes are seen. There is no evidence of acute infiltrate, pleural effusion or pneumothorax. The visualized skeletal structures are unremarkable. IMPRESSION: Very mild bilateral infrahilar atelectasis. Electronically Signed   By: Demetrius Revel.D.  On: 07/25/2022 00:34    Procedures Procedures   CRITICAL CARE Performed by: Tilden Fossa   Total critical care time: 40 minutes  Critical care time was exclusive of separately billable procedures and treating other patients.  Critical care was necessary to treat or prevent imminent or life-threatening deterioration.  Critical care was time spent personally by me on the following activities: development of treatment plan with patient and/or surrogate as well as nursing, discussions with consultants, evaluation of patient's response to treatment, examination of patient, obtaining history from patient or surrogate, ordering and performing treatments and interventions, ordering and review of laboratory studies, ordering and review of radiographic studies, pulse oximetry and re-evaluation of patient's condition.  Medications Ordered in ED Medications  albuterol (PROVENTIL) (2.5 MG/3ML) 0.083% nebulizer solution (10 mg/hr Nebulization New Bag/Given 07/25/22 0033)  albuterol (VENTOLIN HFA) 108 (90 Base) MCG/ACT inhaler 2 puff (has no administration in time range)    ED Course/ Medical Decision Making/ A&P                             Medical Decision Making Amount and/or Complexity of Data Reviewed Labs: ordered. Radiology: ordered.  Risk Prescription drug  management.   Patient with history of hypertension, COPD, CKD here for evaluation of shortness of breath.  Patient in respiratory distress at time of ED arrival with decreased air movement bilaterally, accessory muscle use and increased work of breathing.  He had been treated with Solu-Medrol prior to ED arrival by EMS.  He was treated with hour-long nebulizer treatment in the ED with significant improvement in his symptoms.  He was markedly hypertensive when he arrived to the ED but his blood pressure also improved with improvement of his respiratory distress.  He was observed for an additional several hours without increased work of breathing.  He was able to ambulate the department without difficulty or hypoxia.  Will send home on prednisone taper, antibiotics for inflammation.  No evidence of acute infectious process at this time.  He is also out of his Advair-will refill.  He has seen pulmonary in the past but not recently-will refer to pulmonology.  Labs with baseline renal function.  He has mildly elevated but flat troponins.  EKG is abnormal but similar when compared to priors.  Current clinical picture is not consistent with PE.  Discussed return precautions for progressive or new concerning symptoms.        Final Clinical Impression(s) / ED Diagnoses Final diagnoses:  COPD exacerbation (HCC)    Rx / DC Orders ED Discharge Orders          Ordered    predniSONE (DELTASONE) 10 MG tablet  Daily        07/25/22 0631    albuterol (VENTOLIN HFA) 108 (90 Base) MCG/ACT inhaler  Every 4 hours PRN        07/25/22 0631    fluticasone-salmeterol (ADVAIR DISKUS) 500-50 MCG/ACT AEPB  2 times daily        07/25/22 0631    Ambulatory referral to Pulmonology        07/25/22 0631              Tilden Fossa, MD 07/25/22 [phone]

## 2022-07-26 ENCOUNTER — Ambulatory Visit: Payer: Medicaid Other | Admitting: Nurse Practitioner

## 2022-07-26 ENCOUNTER — Encounter: Payer: Self-pay | Admitting: Nurse Practitioner

## 2022-07-26 ENCOUNTER — Other Ambulatory Visit: Payer: Self-pay | Admitting: Family Medicine

## 2022-07-26 VITALS — BP 180/90 | HR 74 | Temp 98.0°F | Ht 72.0 in | Wt 158.4 lb

## 2022-07-26 DIAGNOSIS — I1A0 Resistant hypertension: Secondary | ICD-10-CM

## 2022-07-26 DIAGNOSIS — R911 Solitary pulmonary nodule: Secondary | ICD-10-CM | POA: Diagnosis not present

## 2022-07-26 DIAGNOSIS — I5032 Chronic diastolic (congestive) heart failure: Secondary | ICD-10-CM

## 2022-07-26 DIAGNOSIS — F17200 Nicotine dependence, unspecified, uncomplicated: Secondary | ICD-10-CM | POA: Diagnosis not present

## 2022-07-26 DIAGNOSIS — J441 Chronic obstructive pulmonary disease with (acute) exacerbation: Secondary | ICD-10-CM

## 2022-07-26 DIAGNOSIS — E875 Hyperkalemia: Secondary | ICD-10-CM

## 2022-07-26 DIAGNOSIS — K921 Melena: Secondary | ICD-10-CM | POA: Insufficient documentation

## 2022-07-26 DIAGNOSIS — R918 Other nonspecific abnormal finding of lung field: Secondary | ICD-10-CM

## 2022-07-26 DIAGNOSIS — Z720 Tobacco use: Secondary | ICD-10-CM

## 2022-07-26 LAB — BASIC METABOLIC PANEL
BUN: 27 mg/dL — ABNORMAL HIGH (ref 6–23)
CO2: 24 mEq/L (ref 19–32)
Calcium: 9.2 mg/dL (ref 8.4–10.5)
Chloride: 106 mEq/L (ref 96–112)
Creatinine, Ser: 2.04 mg/dL — ABNORMAL HIGH (ref 0.40–1.50)
GFR: 33.8 mL/min — ABNORMAL LOW (ref >60.00)
Glucose, Bld: 84 mg/dL (ref 70–99)
Potassium: 4.7 mEq/L (ref 3.5–5.1)
Sodium: 139 mEq/L (ref 135–145)

## 2022-07-26 MED ORDER — BENZONATATE 200 MG PO CAPS
200.0000 mg | ORAL_CAPSULE | Freq: Three times a day (TID) | ORAL | 1 refills | Status: DC | PRN
Start: 2022-07-26 — End: 2022-09-15

## 2022-07-26 MED ORDER — TRELEGY ELLIPTA 100-62.5-25 MCG/ACT IN AEPB
1.0000 | INHALATION_SPRAY | Freq: Every day | RESPIRATORY_TRACT | 5 refills | Status: DC
Start: 2022-07-26 — End: 2022-09-08

## 2022-07-26 MED ORDER — GUAIFENESIN-DM 100-10 MG/5ML PO SYRP
5.0000 mL | ORAL_SOLUTION | Freq: Four times a day (QID) | ORAL | 0 refills | Status: DC | PRN
Start: 2022-07-26 — End: 2022-09-19

## 2022-07-26 MED ORDER — NICOTINE 14 MG/24HR TD PT24
14.0000 mg | MEDICATED_PATCH | Freq: Every day | TRANSDERMAL | 0 refills | Status: DC
Start: 2022-07-26 — End: 2022-09-15

## 2022-07-26 NOTE — Patient Instructions (Addendum)
Start Trelegy 1 puff daily. Brush tongue and rinse mouth afterwards Continue Albuterol inhaler 2 puffs or 3 mL neb every 6 hours as needed for shortness of breath or wheezing. Notify if symptoms persist despite rescue inhaler/neb use.  Complete doxycycline as prescribed Complete prednisone taper   Benzonatate 1 capsule Three times a day for cough Guaifenesin DM cough syrup 5 mL every 6 hours as needed for cough  CT chest in 6-8 weeks   Labs today  Follow up in 4 weeks with Florentina Addison Demon Volante,NP to see how inhaler is going and in 8-10 weeks with Dr. Delton Coombes or Dr. Tonia Brooms to review CT scan results/PFTs. If symptoms do not improve or worsen, please contact office for sooner follow up or seek emergency care.

## 2022-07-26 NOTE — Assessment & Plan Note (Signed)
Euvolemic on exam. Follow up with cardiology as scheduled 

## 2022-07-26 NOTE — Assessment & Plan Note (Signed)
Report of bloody stool 1 week ago. No recurrent episodes. Hgb stable at recent ED visit. He has follow up with his PCP and upcoming colonoscopy already scheduled. Understands ED precautions.

## 2022-07-26 NOTE — Assessment & Plan Note (Addendum)
Presumed AECOPD, started on prednisone taper and empiric doxycycline yesterday. Lung exam without bronchospasm. He is prone to recurrent exacerbations, likely due to lack of maintenance therapy. We will restart him on maintenance therapy with Trelegy - provided with samples. Side effect profile reviewed. Reviewed risks of ICS including lung infections. Action plan in place. Cough control measures. Needs formal PFTs - ordered today.  Patient Instructions  Start Trelegy 1 puff daily. Brush tongue and rinse mouth afterwards Continue Albuterol inhaler 2 puffs or 3 mL neb every 6 hours as needed for shortness of breath or wheezing. Notify if symptoms persist despite rescue inhaler/neb use.  Complete doxycycline as prescribed Complete prednisone taper   Benzonatate 1 capsule Three times a day for cough Guaifenesin DM cough syrup 5 mL every 6 hours as needed for cough  CT chest in 6-8 weeks   Labs today  Follow up in 4 weeks with Florentina Addison Artemis Koller,NP to see how inhaler is going and in 8-10 weeks with Dr. Delton Coombes or Dr. Tonia Brooms to review CT scan results/PFTs. If symptoms do not improve or worsen, please contact office for sooner follow up or seek emergency care.

## 2022-07-26 NOTE — Progress Notes (Signed)
@Patient  ID: Kevin [name], male    DOB: [birth year], 67 y.o.   MRN: [id number]  Chief Complaint  Patient presents with   Follow-up    Pt has been using his albuterol, sob, coughing, pt still smoking.     Referring provider: [name], MD  HPI: 67 year old male, active smoker followed for COPD and lung nodules.  He was last seen by Dr. Thora Lance in 2022. Past medical history significant for PAF not on AC, HTN, CHF, PAD, CAD, OSA not on CPAP, CKD, papillary renal cell cancer, HLD, HIV.   TEST/EVENTS:  11/15/2020 CT chest wo con: atherosclerosis. Early/subtle emphysematous changes. Right lung few small foci of ground-glass opacity, 4 mm and 7 mm. Dense scar density in RLL. 3-4 mm nodule in RUL.  12/27/2021 echo: EF 60-65%, GIDD, RV size and function nl. Mildly elevated PASP. LA mildly dilated.  07/25/2022 CXR: very mild bilateral infrahilar atelectasis   11/02/2020: OV with Dr. Thora Lance. 2 weeks has been having trouble with breathing, weakness, cough. Given doxy, prednisone, inhalers (dulera and albuterol). CXR there showed only hyperexpansion. Has had no fever but lost 20 lb since last winter (20 lb). No night sweats. Works as a Financial risk analyst. No pet birds or hot tubes. Has never lived outside of [place]. At risk for COPD and rapid progression given HIV. Did have eosinophils 700. Would also consider PJP, or other HIV associated ILD (although subacute pace would be odd). Repeat CT chest. PFTs in a month.   07/26/2022: Today - follow up Patient presents today for overdue follow up. He has had recurrent exacerbations over the last year with numerous trips to the ED. Most recently yesterday, 6/11. CXR showed mild infrahilar atelectasis. He was treated with doxycycline and prednisone taper. He was also ordered Advair but has not picked this up. He is using albuterol multiple times a day. He gets short winded with longer distances and household chores. He can't mow more than one strip of his yard without having to  stop. He does have a cough, which is slightly increased from his baseline. Productive with white phlegm. Not noticing much wheezing today. Denies any fevers, chills, hemoptysis, leg swelling, orthopnea, palpitations. 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 No further weight loss. He did have some blood in his stool, which was worked up at his ED visit 6/5. Hgb stable. He has upcoming colonoscopy for further evaluation. His blood pressure is elevated today but he has not taken his antihypertensives. No headaches, vision changes, CP.   No Known Allergies  Immunization History  Administered Date(s) Administered   Hepatitis A, Adult 08/18/2016   Hepatitis B, ADULT 08/17/2016, 09/18/2016, 08/15/2017   Influenza,inj,Quad PF,6+ Mos 02/10/2016, 11/02/2016, 04/05/2019, 01/20/2022   Pneumococcal Conjugate-13 08/15/2017   Pneumococcal Polysaccharide-23 02/10/2016, 04/05/2019    Past Medical History:  Diagnosis Date   AIDS (acquired immune deficiency syndrome) (HCC) 08/17/2016   Chronic diastolic CHF (congestive heart failure), NYHA class 3 (HCC) 01/2016   Chronic lower back pain    CKD (chronic kidney disease), stage III (HCC)    COPD (chronic obstructive pulmonary disease) (HCC)    Gout    "forearms, hands, ankles, feet" (06/05/2016)   Headache    "weekly" (06/05/2016)   Heart murmur    Hypertension    Hypertensive crisis 08/15/2017   Lipoma 07/06/2022   OSA on CPAP    PAD (peripheral artery disease) (HCC)    PAF (paroxysmal atrial fibrillation) (HCC) 01/2016  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    Tobacco History: Social History   Tobacco Use  Smoking Status Every Day   Packs/day: 0.10   Years: 42.00   Additional pack years: 0.00   Total pack years: 4.20   Types: Cigarettes, E-cigarettes   Passive exposure: Never  Smokeless Tobacco Never  Tobacco Comments   1-2 cigarettes a day; vaping   Ready to quit: Not  Answered Counseling given: Not Answered Tobacco comments: 1-2 cigarettes a day; vaping   Outpatient Medications Prior to Visit  Medication Sig Dispense Refill   albuterol (VENTOLIN HFA) 108 (90 Base) MCG/ACT inhaler Inhale 2 puffs into the lungs every 4 (four) hours as needed for wheezing or shortness of breath. 54 g 1   amLODipine (NORVASC) 10 MG tablet Take 10 mg by mouth daily.     aspirin EC 81 MG tablet Take 1 tablet (81 mg total) by mouth daily. Swallow whole. 30 tablet 12   atorvastatin (LIPITOR) 40 MG tablet Take 1 tablet (40 mg total) by mouth daily. 60 tablet 3   carvedilol (COREG) 12.5 MG tablet Take 1 tablet (12.5 mg total) by mouth 2 (two) times daily with a meal. 180 tablet 3   dolutegravir-lamiVUDine (DOVATO) 50-300 MG tablet Take 1 tablet by mouth daily. 30 tablet 11   doxycycline (VIBRAMYCIN) 100 MG capsule Take 1 capsule (100 mg total) by mouth 2 (two) times daily. 10 capsule 0   FEROSUL 325 (65 Fe) MG tablet Take 325 mg by mouth daily.     ipratropium-albuterol (DUONEB) 0.5-2.5 (3) MG/3ML SOLN Take 3 mLs by nebulization every 6 (six) hours as needed. 360 mL 1   oxyCODONE-acetaminophen (PERCOCET) 7.5-325 MG tablet Take 1 tablet by mouth 3 (three) times daily.     predniSONE (DELTASONE) 10 MG tablet Take 4 tablets (40 mg total) by mouth daily for 3 days, THEN 3 tablets (30 mg total) daily for 3 days, THEN 2 tablets (20 mg total) daily for 3 days, THEN 1 tablet (10 mg total) daily for 3 days. 30 tablet 0   valsartan (DIOVAN) 160 MG tablet TAKE 1 TABLET(160 MG) BY MOUTH DAILY 90 tablet 0   fluticasone-salmeterol (ADVAIR DISKUS) 500-50 MCG/ACT AEPB Inhale 1 puff into the lungs in the morning and at bedtime. 180 each 0   No facility-administered medications prior to visit.     Review of Systems:   Constitutional: No weight loss or gain, night sweats, fevers, chills, or lassitude. +fatigue  HEENT: No headaches, difficulty swallowing, tooth/dental problems, or sore throat. No  sneezing, itching, ear ache, nasal congestion, or post nasal drip CV:  No chest pain, orthopnea, PND, swelling in lower extremities, anasarca, dizziness, palpitations, syncope Resp: +shortness of breath with exertion; productive cough; rare wheeze. No hemoptysis. No chest wall deformity GI:  No heartburn, indigestion, abdominal pain, nausea, vomiting, diarrhea, change in bowel habits, loss of appetite +bloody stool (resolved) GU: No dysuria, change in color of urine, urgency or frequency.   Skin: No rash, lesions, ulcerations MSK:  No joint pain or swelling.  Neuro: No dizziness or lightheadedness.  Psych: No depression or anxiety. Mood stable.     Physical Exam:  BP (!) 180/90   Pulse 74   Temp 98 F (36.7 C) (Oral)   Ht 6' (1.829 m)   Wt 158 lb 6.4 oz (71.8 kg)   SpO2 99%   BMI 21.48 kg/m   GEN: Pleasant, interactive, well-kempt; in no acute distress. HEENT:  Normocephalic and atraumatic. PERRLA. Sclera  white. Nasal turbinates pink, moist and patent bilaterally. No rhinorrhea present. Oropharynx pink and moist, without exudate or edema. No lesions, ulcerations, or postnasal drip.  NECK:  Supple w/ fair ROM. No JVD present. Normal carotid impulses w/o bruits. Thyroid symmetrical with no goiter or nodules palpated. No lymphadenopathy.   CV: RRR, no m/r/g, no peripheral edema. Pulses intact, +2 bilaterally. No cyanosis, pallor or clubbing. PULMONARY:  Unlabored, regular breathing. Diminished bilaterally A&P w/o wheezes/rales/rhonchi. Congested cough. No accessory muscle use.  GI: BS present and normoactive. Soft, non-tender to palpation. No organomegaly or masses detected.  MSK: No erythema, warmth or tenderness. Cap refil <2 sec all extrem. No deformities or joint swelling noted. Muscle wasting Neuro: A/Ox3. No focal deficits noted.   Skin: Warm, no lesions or rashe Psych: Normal affect and behavior. Judgement and thought content appropriate.     Lab Results:  CBC     Component Value Date/Time   WBC 12.1 (H) 07/25/2022 0017   RBC 3.63 (L) 07/25/2022 0017   HGB 12.2 (L) 07/25/2022 0041   HGB 12.6 (L) 07/10/2018 1103   HCT 36.0 (L) 07/25/2022 0041   HCT 38.1 07/10/2018 1103   PLT 289 07/25/2022 0017   PLT 276 07/10/2018 1103   MCV 96.4 07/25/2022 0017   MCV 95 07/10/2018 1103   MCH 32.0 07/25/2022 0017   MCHC 33.1 07/25/2022 0017   RDW 14.4 07/25/2022 0017   RDW 13.2 07/10/2018 1103   LYMPHSABS 3.5 07/25/2022 0017   LYMPHSABS 1.5 07/10/2018 1103   MONOABS 0.9 07/25/2022 0017   EOSABS 1.3 (H) 07/25/2022 0017   EOSABS 0.0 07/10/2018 1103   BASOSABS 0.0 07/25/2022 0017   BASOSABS 0.0 07/10/2018 1103    BMET    Component Value Date/Time   NA 139 07/25/2022 0041   NA 138 07/10/2018 1103   K 6.4 (HH) 07/25/2022 0041   CL 104 07/25/2022 0017   CO2 24 07/25/2022 0017   GLUCOSE 121 (H) 07/25/2022 0017   BUN 19 07/25/2022 0017   BUN 17 07/10/2018 1103   CREATININE 2.04 (H) 07/25/2022 0017   CREATININE 2.03 (H) 07/06/2022 1145   CALCIUM 8.4 (L) 07/25/2022 0017   GFRNONAA 36 (L) 07/25/2022 0017   GFRNONAA 52 (L) 08/15/2017 0943   GFRAA 35 (L) 04/05/2019 0420   GFRAA 61 08/15/2017 0943    BNP    Component Value Date/Time   BNP 18.8 07/25/2022 0017     Imaging:  DG Chest Portable 1 View  Result Date: 07/25/2022 CLINICAL DATA:  Shortness of breath. EXAM: PORTABLE CHEST 1 VIEW COMPARISON:  July 19, 2022 FINDINGS: The heart size and mediastinal contours are within normal limits. Very mild bilateral infrahilar atelectatic changes are seen. There is no evidence of acute infiltrate, pleural effusion or pneumothorax. The visualized skeletal structures are unremarkable. IMPRESSION: Very mild bilateral infrahilar atelectasis. Electronically Signed   By: Aram Candela M.D.   On: 07/25/2022 00:34   DG Chest 2 View  Result Date: 07/19/2022 CLINICAL DATA:  Chest pain, shortness of breath. EXAM: CHEST - 2 VIEW COMPARISON:  Jul 14, 2022. FINDINGS:  The heart size and mediastinal contours are within normal limits. Both lungs are clear. The visualized skeletal structures are unremarkable. IMPRESSION: No active cardiopulmonary disease. Electronically Signed   By: Lupita Raider M.D.   On: 07/19/2022 16:16   DG Chest Portable 1 View  Result Date: 07/14/2022 CLINICAL DATA:  Chest pain with shortness of breath EXAM: PORTABLE CHEST 1 VIEW COMPARISON:  Chest  x-ray 07/08/2022 FINDINGS: The heart size and mediastinal contours are within normal limits. Both lungs are clear. The visualized skeletal structures are unremarkable. IMPRESSION: No active disease. Electronically Signed   By: Darliss Cheney M.D.   On: 07/14/2022 21:45   DG Chest 2 View  Result Date: 07/08/2022 CLINICAL DATA:  Shortness of breath and cough. EXAM: CHEST - 2 VIEW COMPARISON:  06/01/2022 FINDINGS: Heart size and mediastinal contours are unremarkable. No pleural fluid or edema. Central airway thickening. Lungs appear hyperinflated. No airspace disease. Visualized osseous structures appear intact. IMPRESSION: Central airway thickening compatible with bronchitis or reactive airways disease. Electronically Signed   By: Signa Kell M.D.   On: 07/08/2022 14:12          No data to display          No results found for: "NITRICOXIDE"      Assessment & Plan:   COPD exacerbation (HCC) Presumed AECOPD, started on prednisone taper and empiric doxycycline yesterday. Lung exam without bronchospasm. He is prone to recurrent exacerbations, likely due to lack of maintenance therapy. We will restart him on maintenance therapy with Trelegy - provided with samples. Side effect profile reviewed. Reviewed risks of ICS including lung infections. Action plan in place. Cough control measures. Needs formal PFTs - ordered today.  Patient Instructions  Start Trelegy 1 puff daily. Brush tongue and rinse mouth afterwards Continue Albuterol inhaler 2 puffs or 3 mL neb every 6 hours as needed for  shortness of breath or wheezing. Notify if symptoms persist despite rescue inhaler/neb use.  Complete doxycycline as prescribed Complete prednisone taper   Benzonatate 1 capsule Three times a day for cough Guaifenesin DM cough syrup 5 mL every 6 hours as needed for cough  CT chest in 6-8 weeks   Labs today  Follow up in 4 weeks with Florentina Addison Cathyrn Deas,NP to see how inhaler is going and in 8-10 weeks with Dr. Delton Coombes or Dr. Tonia Brooms to review CT scan results/PFTs. If symptoms do not improve or worsen, please contact office for sooner follow up or seek emergency care.    Chronic heart failure with preserved ejection fraction (HFpEF) (HCC) Euvolemic on exam. Follow up with cardiology as scheduled.   Lung nodule, multiple Nodular opacities on previous imaging. He was supposed to have 3-6 month follow up, which was not completed. CT chest ordered for further evaluation in 6-8 weeks to allow acute symptoms to resolve. Will refer to lung cancer screening if stable.   Tobacco abuse Smoking cessation strongly advised. Nicotine patches recommended   The patient's current tobacco use: 1 cigarette a day  The patient was advised to quit and impact of smoking on their health.  I assessed the patient's willingness to attempt to quit. I provided methods and skills for cessation. We reviewed medication management of smoking session drugs if appropriate. Resources to help quit smoking were provided. A smoking cessation quit date was set: 08/25/2022 Follow-up was arranged in our clinic.  The amount of time spent counseling patient was 3 mins    Bloody stool Report of bloody stool 1 week ago. No recurrent episodes. Hgb stable at recent ED visit. He has follow up with his PCP and upcoming colonoscopy already scheduled. Understands ED precautions.    I spent 45 minutes of dedicated to the care of this patient on the date of this encounter to include pre-visit review of records, face-to-face time with the patient  discussing conditions above, post visit ordering of testing, clinical documentation with the  electronic health record, making appropriate referrals as documented, and communicating necessary findings to members of the patients care team.  [name], NP 07/26/2022  Pt aware and understands NP's role.

## 2022-07-26 NOTE — Assessment & Plan Note (Signed)
Smoking cessation strongly advised. Nicotine patches recommended   The patient's current tobacco use: 1 cigarette a day  The patient was advised to quit and impact of smoking on their health.  I assessed the patient's willingness to attempt to quit. I provided methods and skills for cessation. We reviewed medication management of smoking session drugs if appropriate. Resources to help quit smoking were provided. A smoking cessation quit date was set: 08/25/2022 Follow-up was arranged in our clinic.  The amount of time spent counseling patient was 3 mins

## 2022-07-26 NOTE — Assessment & Plan Note (Addendum)
Nodular opacities on previous imaging. He was supposed to have 3-6 month follow up, which was not completed. CT chest ordered for further evaluation in 6-8 weeks to allow acute symptoms to resolve. Will refer to lung cancer screening if stable.

## 2022-07-26 NOTE — Telephone Encounter (Signed)
Chart supports rx. Last OV: 04/28/2022 Next OV: 07/27/2022

## 2022-07-27 ENCOUNTER — Ambulatory Visit: Payer: Medicaid Other | Admitting: Family Medicine

## 2022-08-07 ENCOUNTER — Encounter: Payer: Self-pay | Admitting: *Deleted

## 2022-08-07 ENCOUNTER — Other Ambulatory Visit: Payer: Medicaid Other | Admitting: *Deleted

## 2022-08-07 NOTE — Patient Outreach (Addendum)
Medicaid Managed Care   Nurse Care Manager Note  08/07/2022 Name:  Kevin [name] MRN:  [id number] DOB:  Feb 27, 1955  Kevin [name] is an 67 y.o. year old male who is a primary patient of Kevin Gunner, MD.  The [facility] Managed Care Coordination team was consulted for assistance with:    COPD  Kevin [name] was given information about Medicaid Managed Care Coordination team services today. Kevin [name] Patient agreed to services and verbal consent obtained.  Engaged with patient by telephone for follow up visit in response to provider referral for case management and/or care coordination services.   Assessments/Interventions:  Review of past medical history, allergies, medications, health status, including review of consultants reports, laboratory and other test data, was performed as part of comprehensive evaluation and provision of chronic care management services.  SDOH (Social Determinants of Health) assessments and interventions performed: SDOH Interventions    Flowsheet Row Patient Outreach Telephone from 08/07/2022 in [place] POPULATION HEALTH DEPARTMENT Patient Outreach Telephone from 07/04/2022 in [place] POPULATION HEALTH DEPARTMENT Patient Outreach Telephone from 06/02/2022 in [place] POPULATION HEALTH DEPARTMENT Patient Outreach Telephone from 05/04/2022 in [place] POPULATION HEALTH DEPARTMENT Patient Outreach Telephone from 05/03/2022 in [place] POPULATION HEALTH DEPARTMENT  SDOH Interventions       Food Insecurity Interventions Intervention Not Indicated -- -- -- Intervention Not Indicated  Housing Interventions Intervention Not Indicated -- Intervention Not Indicated -- --  Transportation Interventions -- Payor Benefit  [Provided with [phone]] Intervention Not Indicated -- Payor Benefit  [employer] [phone]  Utilities Interventions Other (Comment)  [Advised patient to contact [employer] for increased bill and  inquire about financial assistance programs] -- -- -- --  Stress Interventions -- -- -- [employer], Provide Counseling --       Care Plan  No Known Allergies  Medications Reviewed Today     Reviewed by [name], RN (Registered Nurse) on 08/07/22 at 1054  Med List Status: <None>   Medication Order Taking? Sig Documenting Provider Last Dose Status Informant  albuterol (VENTOLIN HFA) 108 (90 Base) MCG/ACT inhaler [id number] Yes Inhale 2 puffs into the lungs every 4 (four) hours as needed for wheezing or shortness of breath. Tilden Fossa, MD Taking Active   amLODipine (NORVASC) 10 MG tablet [id number] Yes Take 10 mg by mouth daily. [provider] Taking Active   aspirin EC 81 MG tablet [id number] Yes Take 1 tablet (81 mg total) by mouth daily. Swallow whole. Kevin Gunner, MD Taking Active   atorvastatin (LIPITOR) 40 MG tablet [id number] Yes Take 1 tablet (40 mg total) by mouth daily. Kevin Gunner, MD Taking Active            Med Note [name], [name] A   Mon Aug 07, 2022 10:44 AM)    benzonatate (TESSALON) 200 MG capsule [id number]  Take 1 capsule (200 mg total) by mouth 3 (three) times daily as needed for cough. [name], NP  Active   carvedilol (COREG) 12.5 MG tablet [id number] No Take 1 tablet (12.5 mg total) by mouth 2 (two) times daily with a meal.  Patient not taking: Reported on 08/07/2022   Kevin Gunner, MD Not Taking Active            Med Note (Sheddrick Lattanzio A   Fri Jun 02, 2022 10:23 AM) Instructed to stop taking during ED visit  dolutegravir-lamiVUDine (DOVATO) 50-300 MG tablet [id number] Yes Take 1 tablet  by mouth daily. Kevin [name], Kevin Grinder, MD Taking Active   doxycycline (VIBRAMYCIN) 100 MG capsule [id number] Yes Take 1 capsule (100 mg total) by mouth 2 (two) times daily. Tilden Fossa, MD Taking Active   FEROSUL 325 [phone] Fe) MG tablet [id number] Yes Take 325 mg by mouth daily. [provider] Taking Active    Fluticasone-Umeclidin-Vilant (TRELEGY ELLIPTA) 100-62.5-25 MCG/ACT AEPB [id number] Yes Inhale 1 puff into the lungs daily. Cobb, Ruby Cola, NP Taking Active   guaiFENesin-dextromethorphan (ROBITUSSIN DM) 100-10 MG/5ML syrup [id number] No Take 5 mLs by mouth every 6 (six) hours as needed for cough.  Patient not taking: Reported on 08/07/2022   [name], NP Not Taking Active   ipratropium-albuterol (DUONEB) 0.5-2.5 (3) MG/3ML SOLN [id number] No Take 3 mLs by nebulization every 6 (six) hours as needed.  Patient not taking: Reported on 08/07/2022   Kevin Hoar, NP Not Taking Active Self, Pharmacy Records  nicotine (NICODERM CQ - DOSED IN MG/24 HOURS) 14 mg/24hr patch [id number] No Place 1 patch (14 mg total) onto the skin daily.  Patient not taking: Reported on 08/07/2022   [name], NP Not Taking Active   oxyCODONE-acetaminophen (PERCOCET) 7.5-325 MG tablet [id number] Yes Take 1 tablet by mouth 3 (three) times daily. [provider] Taking Active   valsartan (DIOVAN) 160 MG tablet [id number] Yes TAKE 1 TABLET(160 MG) BY MOUTH DAILY Kevin Gunner, MD Taking Active             Patient Active Problem List   Diagnosis Date Noted   Lung nodule, multiple 07/26/2022   Bloody stool 07/26/2022   Lipoma 07/06/2022   Chronic low back pain 04/29/2022   Chronic heart failure with preserved ejection fraction (HFpEF) (HCC) 12/27/2021   HLD (hyperlipidemia) 12/27/2021   Chest pain 12/27/2021   Elevated troponin 12/27/2021   Acute diastolic heart failure (HCC) 07/14/2021   Right renal mass 06/29/2021   Renal lesion 06/29/2021   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   Hypertensive crisis    Acute respiratory failure with hypoxia (HCC)    Dyspnea 04/04/2019   Callus of foot 07/10/2018   Hypertensive urgency 08/15/2017   Coronary artery disease involving native coronary artery of native heart with unstable angina pectoris (HCC) 10/19/2016   Easy bruising  08/11/2016   Claudication in peripheral vascular disease (HCC) 06/05/2016   Stage 3 chronic kidney disease (HCC)    Peripheral arterial disease (HCC) 05/09/2016   Acute on chronic diastolic CHF (congestive heart failure), NYHA class 3 (HCC) 03/24/2016   OSA (obstructive sleep apnea) 03/24/2016   Essential hypertension 02/18/2016   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   Shortness of breath 02/07/2016   PAF (paroxysmal atrial fibrillation) (HCC) 02/07/2016   Tobacco abuse 02/07/2016   Acute on chronic renal insufficiency 02/07/2016   COPD exacerbation (HCC) 02/07/2016   Hypertensive emergency 02/07/2016   HIV disease (HCC) 08/27/2004    Conditions to be addressed/monitored per PCP order:  COPD  Care Plan : RN Care Manager Plan of Care  Updates made by [name], RN since 08/07/2022 12:00 AM     Problem: Health Management needs related to COPD      Long-Range Goal: Development of Plan of Care to address Health Management needs related to COPD   Start Date: 05/03/2022  Expected End Date: 10/13/2022  Note:   Current Barriers:  Chronic Disease Management support and education needs related to COPD  RNCM Clinical Goal(s):  Patient will verbalize understanding of plan  for management of COPD as evidenced by patient reports take all medications exactly as prescribed and will call provider for medication related questions as evidenced by patient reports    attend all scheduled medical appointments: PCP on 08/08/22, [facility] on 08/09/22, Chest CT on 09/06/22 as evidenced by provider documentation        through collaboration with RN Care manager, provider, and care team.   Interventions: Inter-disciplinary care team collaboration (see longitudinal plan of care) Evaluation of current treatment plan related to  self management and patient's adherence to plan as established by provider Provided therapeutic listening   Hypertension Interventions:  (Status:  New goal.) Long  Term Goal Last practice recorded BP readings:  BP Readings from Last 3 Encounters:  07/26/22 (!) 180/90  07/25/22 (!) 171/113  07/19/22 (!) 206/126  Most recent eGFR/CrCl:  Lab Results  Component Value Date   EGFR 36 (L) 07/06/2022    No components found for: "CRCL"  Evaluation of current treatment plan related to hypertension self management and patient's adherence to plan as established by provider Reviewed medications with patient and discussed importance of compliance Discussed complications of poorly controlled blood pressure such as heart disease, stroke, circulatory complications, vision complications, kidney impairment, sexual dysfunction Assessed social determinant of health barriers Advised patient to take BP medication in the morning prior to his PCP appointment on 08/08/22 and take all medications to this appointment    Smoking Cessation Interventions:  (Status:  New goal.) Long Term Goal Reviewed smoking history:  tobacco abuse of >20 years; currently smoking 1-2 ppd Previous quit attempts, unsuccessful several /successful using none  Reports smoking within 30 minutes of waking up Reports triggers to smoke include: stress Reports motivation to quit smoking includes: family and health On a scale of 1-10, reports MOTIVATION to quit is 10 On a scale of 1-10, reports CONFIDENCE in quitting is ?  Evaluation of current treatment plan reviewed Provided patient with printed smoking cessation educational materials Provided contact information for [place] Quit Line (1-800-QUIT-NOW) Assessed social determinant of health barriers   COPD: (Status: Goal on Track (progressing): YES.) Long Term Goal  Reviewed medications with patient, including updating EMR with medications prescribed by [facility] Provided instruction about proper use of medications used for management of COPD including inhalers Advised patient to self assesses COPD action plan zone and make appointment with  provider if in the yellow zone for 48 hours without improvement Advised patient to engage in light exercise as tolerated 3-5 days a week to aid in the the management of COPD Provided education about and advised patient to utilize infection prevention strategies to reduce risk of respiratory infection Discussed the importance of adequate rest and management of fatigue with COPD Assessed social determinant of health barriers Reviewed Pulmonology provider notes from 07/26/22 visit and discussed with patient Advised patient avoid going outside during the hottest parts of the day Advised patient contact [employer] to inquire about recent bill increase, financial assistance and [employer] Relief Advised patient to call and schedule follow up with Pulmonology  Patient Goals/Self-Care Activities: Take medications as prescribed   Attend all scheduled provider appointments Call pharmacy for medication refills 3-7 days in advance of running out of medications Call provider office for new concerns or questions  Work with the social worker to address care coordination needs and will continue to work with the clinical team to address health care and disease management related needs identify and avoid work-related triggers develop a rescue plan develop  a new routine to improve sleep get at least 7 to 8 hours of sleep at night       Follow Up:  Patient agrees to Care Plan and Follow-up.  Plan: The Managed Medicaid care management team will reach out to the patient again over the next 30 days.  Date/time of next scheduled RN care management/care coordination outreach:  09/07/22 @ 10:30am  [name] RN, BSN [place]  Managed [facility] RN Care Coordinator [phone]

## 2022-08-07 NOTE — Patient Instructions (Addendum)
Visit Information  Kevin [name] was given information about Medicaid Managed Care team care coordination services as a part of their Healthy [facility] Medicaid benefit. Kevin [name] verbally consented to engagement with the [facility] Managed Care team.   If you are experiencing a medical emergency, please call 911 or report to your local emergency department or urgent care.   If you have a non-emergency medical problem during routine business hours, please contact your provider's office and ask to speak with a nurse.   For questions related to your Healthy [facility] health plan, please call: [phone] or visit the homepage here: [email or web address]  If you would like to schedule transportation through your Healthy [facility] plan, please call the following number at least 2 days in advance of your appointment: [phone]  For information about your ride after you set it up, call Ride Assist at [phone]. Use this number to activate a Will Call pickup, or if your transportation is late for a scheduled pickup. Use this number, too, if you need to make a change or cancel a previously scheduled reservation.  If you need transportation services right away, call [phone]. The after-hours call center is staffed 24 hours to handle ride assistance and urgent reservation requests (including discharges) 365 days a year. Urgent trips include sick visits, hospital discharge requests and life-sustaining treatment.  Call the [facility] Line at [phone], at any time, 24 hours a day, 7 days a week. If you are in danger or need immediate medical attention call 911.  If you would like help to quit smoking, call 1-800-QUIT-NOW ((216)481-4072) OR Espaol: 1-855-Djelo-Ya (4-742-595-6387) o para ms informacin haga clic aqu or Text READY to 564-332 to register via text  Mr. Schrom,   Please see education materials related to smoking cessation and  COPD provided by MyChart link.  Patient verbalizes understanding of instructions and care plan provided today and agrees to view in MyChart. Active MyChart status and patient understanding of how to access instructions and care plan via MyChart confirmed with patient.     Telephone follow up appointment with Managed Medicaid care management team member scheduled for:09/07/22 @ 10:30 am  [name] RN, BSN [place]  Managed [facility] RN Care Coordinator [phone]   Following is a copy of your plan of care:  Care Plan : RN Care Manager Plan of Care  Updates made by [name], RN since 08/07/2022 12:00 AM     Problem: Health Management needs related to COPD      Long-Range Goal: Development of Plan of Care to address Health Management needs related to COPD   Start Date: 05/03/2022  Expected End Date: 10/13/2022  Note:   Current Barriers:  Chronic Disease Management support and education needs related to COPD  RNCM Clinical Goal(s):  Patient will verbalize understanding of plan for management of COPD as evidenced by patient reports take all medications exactly as prescribed and will call provider for medication related questions as evidenced by patient reports    attend all scheduled medical appointments: PCP on 08/08/22, [facility] on 08/09/22, Chest CT on 09/06/22 as evidenced by provider documentation        through collaboration with RN Care manager, provider, and care team.   Interventions: Inter-disciplinary care team collaboration (see longitudinal plan of care) Evaluation of current treatment plan related to  self management and patient's adherence to plan as established by provider Provided therapeutic listening   Hypertension Interventions:  (Status:  New goal.) Long Term  Goal Last practice recorded BP readings:  BP Readings from Last 3 Encounters:  07/26/22 (!) 180/90  07/25/22 (!) 171/113  07/19/22 (!) 206/126  Most recent eGFR/CrCl:  Lab Results   Component Value Date   EGFR 36 (L) 07/06/2022    No components found for: "CRCL"  Evaluation of current treatment plan related to hypertension self management and patient's adherence to plan as established by provider Reviewed medications with patient and discussed importance of compliance Discussed complications of poorly controlled blood pressure such as heart disease, stroke, circulatory complications, vision complications, kidney impairment, sexual dysfunction Assessed social determinant of health barriers Advised patient to take BP medication in the morning prior to his PCP appointment on 08/08/22 and take all medications to this appointment    Smoking Cessation Interventions:  (Status:  New goal.) Long Term Goal Reviewed smoking history:  tobacco abuse of >20 years; currently smoking 1-2 ppd Previous quit attempts, unsuccessful several /successful using none  Reports smoking within 30 minutes of waking up Reports triggers to smoke include: stress Reports motivation to quit smoking includes: family and health On a scale of 1-10, reports MOTIVATION to quit is 10 On a scale of 1-10, reports CONFIDENCE in quitting is ?  Evaluation of current treatment plan reviewed Provided patient with printed smoking cessation educational materials Provided contact information for [place] Quit Line (1-800-QUIT-NOW) Assessed social determinant of health barriers   COPD: (Status: Goal on Track (progressing): YES.) Long Term Goal  Reviewed medications with patient, including updating EMR with medications prescribed by [facility] Provided instruction about proper use of medications used for management of COPD including inhalers Advised patient to self assesses COPD action plan zone and make appointment with provider if in the yellow zone for 48 hours without improvement Advised patient to engage in light exercise as tolerated 3-5 days a week to aid in the the management of COPD Provided education  about and advised patient to utilize infection prevention strategies to reduce risk of respiratory infection Discussed the importance of adequate rest and management of fatigue with COPD Assessed social determinant of health barriers Reviewed Pulmonology provider notes from 07/26/22 visit and discussed with patient Advised patient avoid going outside during the hottest parts of the day Advised patient contact [employer] to inquire about recent bill increase, financial assistance and [employer] Advised patient to call and schedule follow up with Pulmonology  Patient Goals/Self-Care Activities: Take medications as prescribed   Attend all scheduled provider appointments Call pharmacy for medication refills 3-7 days in advance of running out of medications Call provider office for new concerns or questions  Work with the social worker to address care coordination needs and will continue to work with the clinical team to address health care and disease management related needs identify and avoid work-related triggers develop a rescue plan develop a new routine to improve sleep get at least 7 to 8 hours of sleep at night

## 2022-08-08 ENCOUNTER — Ambulatory Visit: Payer: Medicaid Other | Admitting: Family Medicine

## 2022-08-08 ENCOUNTER — Encounter: Payer: Self-pay | Admitting: Family Medicine

## 2022-08-08 VITALS — BP 152/80 | HR 75 | Temp 97.6°F | Wt 163.0 lb

## 2022-08-08 DIAGNOSIS — J441 Chronic obstructive pulmonary disease with (acute) exacerbation: Secondary | ICD-10-CM

## 2022-08-08 DIAGNOSIS — N1832 Chronic kidney disease, stage 3b: Secondary | ICD-10-CM

## 2022-08-08 DIAGNOSIS — Z139 Encounter for screening, unspecified: Secondary | ICD-10-CM

## 2022-08-08 DIAGNOSIS — I1A0 Resistant hypertension: Secondary | ICD-10-CM | POA: Insufficient documentation

## 2022-08-08 DIAGNOSIS — R229 Localized swelling, mass and lump, unspecified: Secondary | ICD-10-CM | POA: Diagnosis not present

## 2022-08-08 DIAGNOSIS — H538 Other visual disturbances: Secondary | ICD-10-CM

## 2022-08-08 MED ORDER — DM-GUAIFENESIN ER 30-600 MG PO TB12
1.0000 | ORAL_TABLET | Freq: Two times a day (BID) | ORAL | 3 refills | Status: DC
Start: 2022-08-08 — End: 2022-09-15

## 2022-08-08 MED ORDER — CARVEDILOL 6.25 MG PO TABS
6.2500 mg | ORAL_TABLET | Freq: Two times a day (BID) | ORAL | 3 refills | Status: DC
Start: 2022-08-08 — End: 2022-08-16

## 2022-08-08 NOTE — Assessment & Plan Note (Signed)
Improved with treatment; continue current medication regimen Added Mucinex for mucus management Follow up with pulmonology for scheduled breathing test Monitor and manage any exacerbations

## 2022-08-08 NOTE — Assessment & Plan Note (Signed)
Recently elevated creatinine with GFR around 30 Referral to nephrology

## 2022-08-08 NOTE — Assessment & Plan Note (Signed)
Possible cyst noted on left temple, increasing in size and itching Referral to dermatology initiated for further evaluation

## 2022-08-08 NOTE — Assessment & Plan Note (Signed)
Patient endorses return of blurry vision discusses "black spots".  Will place referral to ophthalmology

## 2022-08-08 NOTE — Patient Instructions (Signed)
Medication Instructions:  Start taking carvedilol at 6.25 mg twice a day (half a 12.5 mg tablet twice daily). Continue taking amlodipine 10 mg daily and valsartan 160 mg daily.  COPD Management:  Use your nebulizer 3 times a day to prevent COPD exacerbations. Consider over-the-counter guaifenesin for thick mucus in the mornings.  Specialist Follow-Ups:  Follow up with pulmonology next month and attend any upcoming appointments. We have resubmitted referrals to the high blood pressure clinic, dermatology, and an alternative kidney specialist and ophthalmologist.  Monitoring Instructions:  Monitor for any symptoms like slow heart rate or difficulty breathing, and report immediately if they occur. If you experience any chest pain, shortness of breath, or significant changes in your condition, seek medical attention promptly.  Follow-Up Appointment:  Return for a follow-up appointment in one month to reassess your blood pressure and overall health progress.

## 2022-08-08 NOTE — Progress Notes (Signed)
Assessment/Plan:   Problem List Items Addressed This Visit       Cardiovascular and Mediastinum   Resistant hypertension    Blood pressure elevated; restart carvedilol at 6.25 mg twice daily Referral to specialist blood pressure clinic pending Coordination with care management for scheduling appointments      Relevant Medications   carvedilol (COREG) 6.25 MG tablet   Other Relevant Orders   Ambulatory referral to [facility]   Ambulatory referral to Nephrology     Respiratory   COPD exacerbation (HCC)    Improved with treatment; continue current medication regimen Added Mucinex for mucus management Follow up with pulmonology for scheduled breathing test Monitor and manage any exacerbations      Relevant Medications   dextromethorphan-guaiFENesin (MUCINEX DM) 30-600 MG 12hr tablet     Musculoskeletal and Integument   Skin nodule    Possible cyst noted on left temple, increasing in size and itching Referral to dermatology initiated for further evaluation      Relevant Orders   Ambulatory referral to Dermatology     Genitourinary   Stage 3 chronic kidney disease (HCC) - Primary    Recently elevated creatinine with GFR around 30 Referral to nephrology      Relevant Orders   Ambulatory referral to Nephrology     Other   Blurry vision, bilateral    Patient endorses return of blurry vision discusses "black spots".  Will place referral to ophthalmology      Relevant Orders   Ambulatory referral to Ophthalmology   Other Visit Diagnoses     Encounter for screening involving social determinants of health [facility])       Relevant Orders   AMB Referral to Managed Medicaid Care Management       Medications Discontinued During This Encounter  Medication Reason   carvedilol (COREG) 12.5 MG tablet Reorder    Return in about 4 weeks (around 09/05/2022) for BP.    Subjective:   Encounter date: 08/08/2022  Kevin [name] is a  67 y.o. male who has Shortness of breath; PAF (paroxysmal atrial fibrillation) (HCC); Tobacco abuse; Acute on chronic renal insufficiency; COPD exacerbation (HCC); Hypertensive emergency; Blurry vision, bilateral; Hypertensive cardiovascular disease; Essential hypertension; Acute on chronic diastolic CHF (congestive heart failure), NYHA class 3 (HCC); OSA (obstructive sleep apnea); Peripheral arterial disease (HCC); Stage 3 chronic kidney disease (HCC); Claudication in peripheral vascular disease (HCC); HIV disease (HCC); Easy bruising; Coronary artery disease involving native coronary artery of native heart with unstable angina pectoris (HCC); Hypertensive urgency; Callus of foot; Dyspnea; Hypertensive crisis; Acute respiratory failure with hypoxia (HCC); Papillary renal cell carcinoma (HCC); Right renal mass; Renal lesion; Acute diastolic heart failure (HCC); Chronic heart failure with preserved ejection fraction (HFpEF) (HCC); HLD (hyperlipidemia); Chest pain; Elevated troponin; Chronic low back pain; Lipoma; Lung nodule, multiple; Bloody stool; Resistant hypertension; and Skin nodule on their problem list..   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   Chief Complaint: Follow-up visit for COPD exacerbation and blood pressure management.  History of Present Illness:  COPD Exacerbation: Kevin [name] is following  up after multiple emergency department visits due to COPD exacerbations that occurred on  May 24th, May 31st, June 5th, and June 11th. He reports improved breathing but continues to experience morning mucus production. He has been seen by pulmonology and is scheduled for a breathing test next month. He has been prescribed new inhalers including Trilogy, alongside steroids and nebulizer treatments. He finds the inhalers effective but still struggles with mucus.  Hypertension: His blood pressure was elevated at 187 mmHg during hospital admission. He is currently on amlodipine. He was advised to restart carvedilol at a reduced dose (6.25 mg twice a day) due to previous discontinuation because of bradycardia concerns. He reports no current chest pain or palpitations. Referral to a specialist blood pressure clinic is pending, and care management assistance was requested to facilitate appointments.  Skin Lesion: Kevin [name] noted a lesion on the left side of his temple, which has been present for approximately a year and is increasing in size and occasionally itching. Planned referral to dermatology for further evaluation.  Review of Systems  Constitutional:  Negative for chills, diaphoresis, fever, malaise/fatigue and weight loss.  HENT:  Negative for congestion, ear discharge, ear pain and hearing loss.   Eyes:  Positive for blurred vision. Negative for double vision, photophobia, pain, discharge and redness.  Respiratory:  Positive for cough and shortness of breath (improved). Negative for sputum production and wheezing.   Cardiovascular:  Negative for chest pain and palpitations.  Gastrointestinal:  Negative for abdominal pain, blood in stool, constipation, diarrhea, heartburn, melena, nausea and vomiting.  Genitourinary:  Negative for dysuria, flank pain, frequency, hematuria and urgency.  Musculoskeletal:  Negative for myalgias.  Skin:  Positive for itching (Lesion on left temple). Negative for rash.  Neurological:  Negative for dizziness, tingling, tremors, speech change, seizures, loss of  consciousness, weakness and headaches.  Psychiatric/Behavioral:  Negative for depression, hallucinations, memory loss, substance abuse and suicidal ideas. The patient is not nervous/anxious and does not have insomnia.   All other systems reviewed and are negative.   Past Surgical History:  Procedure Laterality Date   IR RADIOLOGIST EVAL & MGMT  05/31/2021   IR RADIOLOGIST EVAL & MGMT  07/26/2021   LEFT HEART CATH AND CORONARY ANGIOGRAPHY N/A 10/23/2016   Procedure: LEFT HEART CATH AND CORONARY ANGIOGRAPHY;  Surgeon: Marykay Lex, MD;  Location: [facility] INVASIVE CV LAB;  Service: Cardiovascular;  Laterality: N/A;   LOWER EXTREMITY ANGIOGRAPHY N/A 07/17/2016   Procedure: Lower Extremity Angiography;  Surgeon: Runell Gess, MD;  Location: [facility] INVASIVE CV LAB;  Service: Cardiovascular;  Laterality: N/A;   LOWER EXTREMITY INTERVENTION N/A 06/05/2016   Procedure: Lower Extremity Intervention;  Surgeon: Runell Gess, MD;  Location: [facility] INVASIVE CV LAB;  Service: Cardiovascular;  Laterality: N/A;   PERIPHERAL VASCULAR ATHERECTOMY Right 07/17/2016   Procedure: Peripheral Vascular Atherectomy;  Surgeon: Runell Gess, MD;  Location: [facility] INVASIVE CV LAB;  Service: Cardiovascular;  Laterality: Right;  SFA   PERIPHERAL VASCULAR INTERVENTION  06/05/2016   Procedure: Peripheral Vascular Intervention;  Surgeon: Runell Gess, MD;  Location: [facility] INVASIVE CV LAB;  Service: Cardiovascular;;  left SFA   RADIOLOGY WITH ANESTHESIA Right 06/29/2021   Procedure: RIGHT RENAL CRYOABLATION;  Surgeon: Richarda Overlie, MD;  Location: [facility] ORS;  Service: Anesthesiology;  Laterality: Right;    Outpatient Medications Prior to Visit  Medication Sig Dispense Refill   albuterol (VENTOLIN HFA) 108 (90 Base) MCG/ACT inhaler Inhale 2 puffs into the lungs every 4 (four) hours as  needed for wheezing or shortness of breath. 54 g 1   amLODipine (NORVASC) 10 MG tablet Take 10 mg by mouth daily.     dolutegravir-lamiVUDine (DOVATO) 50-300  MG tablet Take 1 tablet by mouth daily. 30 tablet 11   doxycycline (VIBRAMYCIN) 100 MG capsule Take 1 capsule (100 mg total) by mouth 2 (two) times daily. 10 capsule 0   FEROSUL 325 (65 Fe) MG tablet Take 325 mg by mouth daily.     Fluticasone-Umeclidin-Vilant (TRELEGY ELLIPTA) 100-62.5-25 MCG/ACT AEPB Inhale 1 puff into the lungs daily. 60 each 5   ipratropium-albuterol (DUONEB) 0.5-2.5 (3) MG/3ML SOLN Take 3 mLs by nebulization every 6 (six) hours as needed. 360 mL 1   oxyCODONE-acetaminophen (PERCOCET) 7.5-325 MG tablet Take 1 tablet by mouth 3 (three) times daily.     valsartan (DIOVAN) 160 MG tablet TAKE 1 TABLET(160 MG) BY MOUTH DAILY 90 tablet 0   aspirin EC 81 MG tablet Take 1 tablet (81 mg total) by mouth daily. Swallow whole. (Patient not taking: Reported on 08/08/2022) 30 tablet 12   atorvastatin (LIPITOR) 40 MG tablet Take 1 tablet (40 mg total) by mouth daily. (Patient not taking: Reported on 08/08/2022) 60 tablet 3   benzonatate (TESSALON) 200 MG capsule Take 1 capsule (200 mg total) by mouth 3 (three) times daily as needed for cough. (Patient not taking: Reported on 08/08/2022) 30 capsule 1   guaiFENesin-dextromethorphan (ROBITUSSIN DM) 100-10 MG/5ML syrup Take 5 mLs by mouth every 6 (six) hours as needed for cough. (Patient not taking: Reported on 08/07/2022) 118 mL 0   nicotine (NICODERM CQ - DOSED IN MG/24 HOURS) 14 mg/24hr patch Place 1 patch (14 mg total) onto the skin daily. (Patient not taking: Reported on 08/07/2022) 28 patch 0   carvedilol (COREG) 12.5 MG tablet Take 1 tablet (12.5 mg total) by mouth 2 (two) times daily with a meal. (Patient not taking: Reported on 08/07/2022) 180 tablet 3   No facility-administered medications prior to visit.    Family History  Problem Relation Age of Onset   High blood pressure Mother    Lupus Mother     Social History   Socioeconomic History   Marital status: Significant Other    Spouse name: Not on file   Number of children: Not on  file   Years of education: Not on file   Highest education level: Not on file  Occupational History   Not on file  Tobacco Use   Smoking status: Every Day    Packs/day: 0.10    Years: 42.00    Additional pack years: 0.00    Total pack years: 4.20    Types: Cigarettes, E-cigarettes    Passive exposure: Never   Smokeless tobacco: Never   Tobacco comments:    1-2 cigarettes a day; vaping  Vaping Use   Vaping Use: Every day   Substances: Nicotine, Flavoring  Substance and Sexual Activity   Alcohol use: Not Currently    Alcohol/week: 2.0 standard drinks of alcohol    Types: 2 Cans of beer per week    Comment: rare   Drug use: Not Currently    Comment: rare   Sexual activity: Not Currently    Comment: declined condoms  Other Topics Concern   Not on file  Social History Narrative   Not on file   Social Determinants of Health   Financial Resource Strain: Not on file  Food Insecurity: No Food Insecurity (08/07/2022)   Hunger Vital Sign  Worried About Programme researcher, broadcasting/film/video in the Last Year: Never true    [employer] of Food in the Last Year: Never true  Transportation Needs: Unmet Transportation Needs (07/04/2022)   PRAPARE - Administrator, Civil Service (Medical): Yes    Lack of Transportation (Non-Medical): No  Physical Activity: Not on file  Stress: Stress Concern Present (05/04/2022)   [employer] of Occupational Health - Occupational Stress Questionnaire    Feeling of Stress : To some extent  Social Connections: Not on file  Intimate Partner Violence: Not At Risk (12/27/2021)   Humiliation, Afraid, Rape, and Kick questionnaire    Fear of Current or Ex-Partner: No    Emotionally Abused: No    Physically Abused: No    Sexually Abused: No                                                                                                  Objective:  Physical Exam: BP (!) 152/80 (BP Location: Right Arm, Patient Position: Sitting, Cuff Size: Large)   Pulse  75   Temp 97.6 F (36.4 C) (Temporal)   Wt 163 lb (73.9 kg)   SpO2 99%   BMI 22.11 kg/m     Physical Exam Constitutional:      Appearance: Normal appearance.  HENT:     Head: Normocephalic and atraumatic.     Right Ear: Hearing normal.     Left Ear: Hearing normal.     Nose: Nose normal.  Eyes:     General: No scleral icterus.       Right eye: No discharge.        Left eye: No discharge.     Extraocular Movements: Extraocular movements intact.  Cardiovascular:     Rate and Rhythm: Normal rate and regular rhythm.     Heart sounds: Normal heart sounds.  Pulmonary:     Effort: Pulmonary effort is normal.     Breath sounds: Normal breath sounds.  Abdominal:     Palpations: Abdomen is soft.     Tenderness: There is no abdominal tenderness.  Skin:    General: Skin is warm.     Findings: Lesion (Cystic lesion on left temple, nontender) present. No rash.  Neurological:     General: No focal deficit present.     Mental Status: He is alert.     Cranial Nerves: No cranial nerve deficit.  Psychiatric:        Mood and Affect: Mood normal.        Behavior: Behavior normal.        Thought Content: Thought content normal.        Judgment: Judgment normal.     DG Chest Portable 1 View  Result Date: 07/25/2022 CLINICAL DATA:  Shortness of breath. EXAM: PORTABLE CHEST 1 VIEW COMPARISON:  July 19, 2022 FINDINGS: The heart size and mediastinal contours are within normal limits. Very mild bilateral infrahilar atelectatic changes are seen. There is no evidence of acute infiltrate, pleural effusion or pneumothorax. The visualized skeletal structures are unremarkable. IMPRESSION: Very mild bilateral infrahilar atelectasis.  Electronically Signed   By: Aram Candela M.D.   On: 07/25/2022 00:34   DG Chest 2 View  Result Date: 07/19/2022 CLINICAL DATA:  Chest pain, shortness of breath. EXAM: CHEST - 2 VIEW COMPARISON:  Jul 14, 2022. FINDINGS: The heart size and mediastinal contours are  within normal limits. Both lungs are clear. The visualized skeletal structures are unremarkable. IMPRESSION: No active cardiopulmonary disease. Electronically Signed   By: Lupita Raider M.D.   On: 07/19/2022 16:16   DG Chest Portable 1 View  Result Date: 07/14/2022 CLINICAL DATA:  Chest pain with shortness of breath EXAM: PORTABLE CHEST 1 VIEW COMPARISON:  Chest x-ray 07/08/2022 FINDINGS: The heart size and mediastinal contours are within normal limits. Both lungs are clear. The visualized skeletal structures are unremarkable. IMPRESSION: No active disease. Electronically Signed   By: Darliss Cheney M.D.   On: 07/14/2022 21:45   DG Chest 2 View  Result Date: 07/08/2022 CLINICAL DATA:  Shortness of breath and cough. EXAM: CHEST - 2 VIEW COMPARISON:  06/01/2022 FINDINGS: Heart size and mediastinal contours are unremarkable. No pleural fluid or edema. Central airway thickening. Lungs appear hyperinflated. No airspace disease. Visualized osseous structures appear intact. IMPRESSION: Central airway thickening compatible with bronchitis or reactive airways disease. Electronically Signed   By: Signa Kell M.D.   On: 07/08/2022 14:12   DG Chest Port 1 View  Result Date: 06/01/2022 CLINICAL DATA:  Abnormal EKG EXAM: PORTABLE CHEST 1 VIEW COMPARISON:  Chest x-ray 05/08/2022 FINDINGS: No consolidation, pneumothorax or effusion. Normal cardiopericardial silhouette without edema. Overlapping cardiac leads. The extreme inferior costophrenic angles are clipped off the edge of the film. IMPRESSION: No acute cardiopulmonary disease Electronically Signed   By: Karen Kays M.D.   On: 06/01/2022 13:47    Recent Results (from the past 2160 hour(s))  CBC with Differential/Platelet     Status: Abnormal   Collection Time: 06/01/22 12:28 PM  Result Value Ref Range   WBC 7.1 4.0 - 10.5 K/uL   RBC 3.36 (L) 4.22 - 5.81 MIL/uL   Hemoglobin 10.7 (L) 13.0 - 17.0 g/dL   HCT 16.1 (L) 09.6 - 04.5 %   MCV 94.0 80.0 - 100.0  fL   MCH 31.8 26.0 - 34.0 pg   MCHC 33.9 30.0 - 36.0 g/dL   RDW 40.9 81.1 - 91.4 %   Platelets 301 150 - 400 K/uL   nRBC 0.0 0.0 - 0.2 %   Neutrophils Relative % 55 %   Neutro Abs 3.9 1.7 - 7.7 K/uL   Lymphocytes Relative 33 %   Lymphs Abs 2.4 0.7 - 4.0 K/uL   Monocytes Relative 7 %   Monocytes Absolute 0.5 0.1 - 1.0 K/uL   Eosinophils Relative 4 %   Eosinophils Absolute 0.3 0.0 - 0.5 K/uL   Basophils Relative 1 %   Basophils Absolute 0.0 0.0 - 0.1 K/uL   Immature Granulocytes 0 %   Abs Immature Granulocytes 0.01 0.00 - 0.07 K/uL    Comment: Performed at [facility], [address], [place], [place] [address]  Basic metabolic panel     Status: Abnormal   Collection Time: 06/01/22 12:28 PM  Result Value Ref Range   Sodium 136 135 - 145 mmol/L   Potassium 3.9 3.5 - 5.1 mmol/L   Chloride 110 98 - 111 mmol/L   CO2 22 22 - 32 mmol/L   Glucose, Bld 93 70 - 99 mg/dL    Comment: Glucose reference range applies only  to samples taken after fasting for at least 8 hours.   BUN 20 8 - 23 mg/dL   Creatinine, Ser 6.01 (H) 0.61 - 1.24 mg/dL   Calcium 8.5 (L) 8.9 - 10.3 mg/dL   GFR, Estimated 37 (L) >60 mL/min    Comment: (NOTE) Calculated using the CKD-EPI Creatinine Equation (2021)    Anion gap 4 (L) 5 - 15    Comment: Performed at [facility], [address] [place], [place] [address]  Troponin I (High Sensitivity)     Status: Abnormal   Collection Time: 06/01/22 12:28 PM  Result Value Ref Range   Troponin I (High Sensitivity) 39 (H) <18 ng/L    Comment: (NOTE) Elevated high sensitivity troponin I (hsTnI) values and significant  changes across serial measurements may suggest ACS but many other  chronic and acute conditions are known to elevate hsTnI results.  Refer to the "Links" section for chest pain algorithms and additional  guidance. Performed at [facility], [address], [place], [place] [address]   T-helper  cells (CD4) count (not at [facility])     Status: None   Collection Time: 07/06/22 11:29 AM  Result Value Ref Range   CD4 T Cell Abs 842 400 - 1,790 /uL   CD4 % Helper T Cell 52 33 - 65 %    Comment: Performed at [facility], [address], [place], [place] [address]  HIV-1 RNA quant-no reflex-bld     Status: None   Collection Time: 07/06/22 11:45 AM  Result Value Ref Range   HIV 1 RNA Quant Not Detected Copies/mL   HIV-1 RNA Quant, Log Not Detected Log cps/mL    Comment: . Reference Range:                           Not Detected     copies/mL                           Not Detected Log copies/mL . Marland Kitchen The test was performed using Real-Time Polymerase Chain Reaction. . . Reportable Range: 20 copies/mL to 10,000,000 copies/mL (1.30 Log copies/mL to 7.00 Log copies/mL). .   COMPLETE METABOLIC PANEL WITH GFR     Status: Abnormal   Collection Time: 07/06/22 11:45 AM  Result Value Ref Range   Glucose, Bld 60 (L) 65 - 99 mg/dL    Comment: .            Fasting reference interval .    BUN 17 7 - 25 mg/dL   Creat 2.70 (H) 6.23 - 1.35 mg/dL   eGFR 36 (L) > OR = 60 mL/min/1.36m2   BUN/Creatinine Ratio 8 6 - 22 (calc)   Sodium 142 135 - 146 mmol/L   Potassium 4.0 3.5 - 5.3 mmol/L   Chloride 111 (H) 98 - 110 mmol/L    Comment: Verified by repeat analysis. .    CO2 24 20 - 32 mmol/L   Calcium 8.5 (L) 8.6 - 10.3 mg/dL   Total Protein 7.2 6.1 - 8.1 g/dL   Albumin 4.2 3.6 - 5.1 g/dL   Globulin 3.0 1.9 - 3.7 g/dL (calc)   AG Ratio 1.4 1.0 - 2.5 (calc)   Total Bilirubin 0.5 0.2 - 1.2 mg/dL   Alkaline phosphatase (APISO) 75 35 - 144 U/L   AST 12 10 - 35 U/L   ALT 4 (L) 9 -  46 U/L  CBC with Differential/Platelet     Status: Abnormal   Collection Time: 07/06/22 11:45 AM  Result Value Ref Range   WBC 7.3 3.8 - 10.8 Thousand/uL   RBC 3.79 (L) 4.20 - 5.80 Million/uL   Hemoglobin 11.9 (L) 13.2 - 17.1 g/dL   HCT 60.6 (L) 30.1 - 60.1 %   MCV 96.3 80.0 - 100.0 fL   MCH 31.4  27.0 - 33.0 pg   MCHC 32.6 32.0 - 36.0 g/dL   RDW 09.3 23.5 - 57.3 %   Platelets 319 140 - 400 Thousand/uL   MPV 9.9 7.5 - 12.5 fL   Neutro Abs 4,482 1,500 - 7,800 cells/uL   Lymphs Abs 1,840 850 - 3,900 cells/uL   Absolute Monocytes 387 200 - 950 cells/uL   Eosinophils Absolute 533 (H) 15 - 500 cells/uL   Basophils Absolute 58 0 - 200 cells/uL   Neutrophils Relative % 61.4 %   Total Lymphocyte 25.2 %   Monocytes Relative 5.3 %   Eosinophils Relative 7.3 %   Basophils Relative 0.8 %  RPR     Status: None   Collection Time: 07/06/22 11:45 AM  Result Value Ref Range   RPR Ser Ql NON-REACTIVE NON-REACTIVE    Comment: . No laboratory evidence of syphilis. If recent exposure is suspected, submit a new sample in 2-4 weeks. .   Lipid panel     Status: Abnormal   Collection Time: 07/06/22 11:45 AM  Result Value Ref Range   Cholesterol 184 <200 mg/dL   HDL 52 > OR = 40 mg/dL   Triglycerides 220 <254 mg/dL   LDL Cholesterol (Calc) 110 (H) mg/dL (calc)    Comment: Reference range: <100 . Desirable range <100 mg/dL for primary prevention;   <70 mg/dL for patients with CHD or diabetic patients  with > or = 2 CHD risk factors. Marland Kitchen LDL-C is now calculated using the Martin-Hopkins  calculation, which is a validated novel method providing  better accuracy than the Friedewald equation in the  estimation of LDL-C.  Horald Pollen et al. Lenox Ahr. 2706;237(62): 2061-2068  (http://education.[facility].com/faq/FAQ164)    Total CHOL/HDL Ratio 3.5 <5.0 (calc)   Non-HDL Cholesterol (Calc) 132 (H) <130 mg/dL (calc)    Comment: For patients with diabetes plus 1 major ASCVD risk  factor, treating to a non-HDL-C goal of <100 mg/dL  (LDL-C of <83 mg/dL) is considered a therapeutic  option.   CBC     Status: Abnormal   Collection Time: 07/14/22  9:29 PM  Result Value Ref Range   WBC 11.8 (H) 4.0 - 10.5 K/uL   RBC 3.53 (L) 4.22 - 5.81 MIL/uL   Hemoglobin 11.3 (L) 13.0 - 17.0 g/dL   HCT 15.1 (L)  76.1 - 52.0 %   MCV 95.8 80.0 - 100.0 fL   MCH 32.0 26.0 - 34.0 pg   MCHC 33.4 30.0 - 36.0 g/dL   RDW 60.7 37.1 - 06.2 %   Platelets 298 150 - 400 K/uL   nRBC 0.0 0.0 - 0.2 %    Comment: Performed at [facility], [address] [place], [place] [address]  Troponin I (High Sensitivity)     Status: Abnormal   Collection Time: 07/14/22  9:29 PM  Result Value Ref Range   Troponin I (High Sensitivity) 33 (H) <18 ng/L    Comment: (NOTE) Elevated high sensitivity troponin I (hsTnI) values and significant  changes across serial measurements may suggest ACS but many other  chronic  and acute conditions are known to elevate hsTnI results.  Refer to the "Links" section for chest pain algorithms and additional  guidance. Performed at [facility], [address], [place], [place] [address]   Brain natriuretic peptide     Status: None   Collection Time: 07/14/22  9:29 PM  Result Value Ref Range   B Natriuretic Peptide 43.4 0.0 - 100.0 pg/mL    Comment: Performed at [facility], [address] [place], [place] [address]  Troponin I (High Sensitivity)     Status: Abnormal   Collection Time: 07/14/22 11:26 PM  Result Value Ref Range   Troponin I (High Sensitivity) 42 (H) <18 ng/L    Comment: (NOTE) Elevated high sensitivity troponin I (hsTnI) values and significant  changes across serial measurements may suggest ACS but many other  chronic and acute conditions are known to elevate hsTnI results.  Refer to the "Links" section for chest pain algorithms and additional  guidance. Performed at [facility], [address] [place], [place] [address]   Basic metabolic panel     Status: Abnormal   Collection Time: 07/14/22 11:26 PM  Result Value Ref Range   Sodium 138 135 - 145 mmol/L   Potassium 3.7 3.5 - 5.1 mmol/L   Chloride 109 98 - 111 mmol/L   CO2 20 (L) 22 - 32 mmol/L   Glucose, Bld 102 (H) 70 - 99 mg/dL    Comment: Glucose reference range applies only to  samples taken after fasting for at least 8 hours.   BUN 23 8 - 23 mg/dL   Creatinine, Ser 9.14 (H) 0.61 - 1.24 mg/dL   Calcium 8.5 (L) 8.9 - 10.3 mg/dL   GFR, Estimated 36 (L) >60 mL/min    Comment: (NOTE) Calculated using the CKD-EPI Creatinine Equation (2021)    Anion gap 9 5 - 15    Comment: Performed at [facility], [address] [place], [place] [address]  Basic metabolic panel     Status: Abnormal   Collection Time: 07/19/22  3:45 PM  Result Value Ref Range   Sodium 136 135 - 145 mmol/L   Potassium 3.5 3.5 - 5.1 mmol/L   Chloride 106 98 - 111 mmol/L   CO2 23 22 - 32 mmol/L   Glucose, Bld 107 (H) 70 - 99 mg/dL    Comment: Glucose reference range applies only to samples taken after fasting for at least 8 hours.   BUN 14 8 - 23 mg/dL   Creatinine, Ser 6.21 (H) 0.61 - 1.24 mg/dL   Calcium 8.4 (L) 8.9 - 10.3 mg/dL   GFR, Estimated 42 (L) >60 mL/min    Comment: (NOTE) Calculated using the CKD-EPI Creatinine Equation (2021)    Anion gap 7 5 - 15    Comment: Performed at [facility], [address], [place], [place] [address]  CBC     Status: Abnormal   Collection Time: 07/19/22  3:45 PM  Result Value Ref Range   WBC 9.8 4.0 - 10.5 K/uL   RBC 3.67 (L) 4.22 - 5.81 MIL/uL   Hemoglobin 11.5 (L) 13.0 - 17.0 g/dL   HCT 78.4 (L) 69.6 - 29.5 %   MCV 94.3 80.0 - 100.0 fL   MCH 31.3 26.0 - 34.0 pg   MCHC 33.2 30.0 - 36.0 g/dL   RDW 28.4 13.2 - 44.0 %   Platelets 298 150 - 400 K/uL   nRBC 0.0 0.0 -  0.2 %    Comment: Performed at [facility], [address] [place], [place] [address]  Troponin I (High Sensitivity)     Status: Abnormal   Collection Time: 07/19/22  3:45 PM  Result Value Ref Range   Troponin I (High Sensitivity) 37 (H) <18 ng/L    Comment: (NOTE) Elevated high sensitivity troponin I (hsTnI) values and significant  changes across serial measurements may suggest ACS but many other  chronic and acute conditions are known to elevate hsTnI results.   Refer to the "Links" section for chest pain algorithms and additional  guidance. Performed at [facility], [address], [place], [place] [address]   Type and screen [facility]     Status: None   Collection Time: 07/19/22  3:45 PM  Result Value Ref Range   ABO/RH(D) O POS    Antibody Screen NEG    Sample Expiration      07/22/2022,2359 Performed at [facility], [address], [place], [place] [address]   Troponin I (High Sensitivity)     Status: Abnormal   Collection Time: 07/19/22  8:07 PM  Result Value Ref Range   Troponin I (High Sensitivity) 36 (H) <18 ng/L    Comment: (NOTE) Elevated high sensitivity troponin I (hsTnI) values and significant  changes across serial measurements may suggest ACS but many other  chronic and acute conditions are known to elevate hsTnI results.  Refer to the "Links" section for chest pain algorithms and additional  guidance. Performed at [facility], [address], [place], [place] [address]   POC occult blood, ED     Status: Abnormal   Collection Time: 07/19/22  9:19 PM  Result Value Ref Range   Fecal Occult Bld POSITIVE (A) NEGATIVE  Comprehensive metabolic panel     Status: Abnormal   Collection Time: 07/25/22 12:17 AM  Result Value Ref Range   Sodium 137 135 - 145 mmol/L   Potassium 4.3 3.5 - 5.1 mmol/L    Comment: HEMOLYSIS AT THIS LEVEL MAY AFFECT RESULT   Chloride 104 98 - 111 mmol/L   CO2 24 22 - 32 mmol/L   Glucose, Bld 121 (H) 70 - 99 mg/dL    Comment: Glucose reference range applies only to samples taken after fasting for at least 8 hours.   BUN 19 8 - 23 mg/dL   Creatinine, Ser 6.57 (H) 0.61 - 1.24 mg/dL   Calcium 8.4 (L) 8.9 - 10.3 mg/dL   Total Protein 6.9 6.5 - 8.1 g/dL   Albumin 3.5 3.5 - 5.0 g/dL   AST 25 15 - 41 U/L    Comment: HEMOLYSIS AT THIS LEVEL MAY AFFECT RESULT   ALT 16 0 - 44 U/L    Comment: HEMOLYSIS AT THIS LEVEL MAY AFFECT RESULT   Alkaline Phosphatase 70 38 -  126 U/L   Total Bilirubin 1.0 0.3 - 1.2 mg/dL    Comment: HEMOLYSIS AT THIS LEVEL MAY AFFECT RESULT   GFR, Estimated 36 (L) >60 mL/min    Comment: (NOTE) Calculated using the CKD-EPI Creatinine Equation (2021)    Anion gap 9 5 - 15    Comment: Performed at [facility], [address] [place], [place] [address]  Troponin I (High Sensitivity)     Status: Abnormal   Collection Time: 07/25/22 12:17 AM  Result Value Ref Range   Troponin I (High Sensitivity) 45 (H) <18 ng/L    Comment: (  NOTE) Elevated high sensitivity troponin I (hsTnI) values and significant  changes across serial measurements may suggest ACS but many other  chronic and acute conditions are known to elevate hsTnI results.  Refer to the "Links" section for chest pain algorithms and additional  guidance. Performed at [facility], [address], [place], [place] [address]   CBC with Differential     Status: Abnormal   Collection Time: 07/25/22 12:17 AM  Result Value Ref Range   WBC 12.1 (H) 4.0 - 10.5 K/uL   RBC 3.63 (L) 4.22 - 5.81 MIL/uL   Hemoglobin 11.6 (L) 13.0 - 17.0 g/dL   HCT 13.0 (L) 86.5 - 78.4 %   MCV 96.4 80.0 - 100.0 fL   MCH 32.0 26.0 - 34.0 pg   MCHC 33.1 30.0 - 36.0 g/dL   RDW 69.6 29.5 - 28.4 %   Platelets 289 150 - 400 K/uL   nRBC 0.0 0.0 - 0.2 %   Neutrophils Relative % 53 %   Neutro Abs 6.3 1.7 - 7.7 K/uL   Lymphocytes Relative 29 %   Lymphs Abs 3.5 0.7 - 4.0 K/uL   Monocytes Relative 7 %   Monocytes Absolute 0.9 0.1 - 1.0 K/uL   Eosinophils Relative 11 %   Eosinophils Absolute 1.3 (H) 0.0 - 0.5 K/uL   Basophils Relative 0 %   Basophils Absolute 0.0 0.0 - 0.1 K/uL   Immature Granulocytes 0 %   Abs Immature Granulocytes 0.05 0.00 - 0.07 K/uL    Comment: Performed at [facility], [address], [place], [place] [address]  Brain natriuretic peptide     Status: None   Collection Time: 07/25/22 12:17 AM  Result Value Ref Range   B Natriuretic Peptide 18.8 0.0 - 100.0  pg/mL    Comment: Performed at [facility], [address] [place], [place] [address]  I-Stat venous blood gas, ED     Status: Abnormal   Collection Time: 07/25/22 12:39 AM  Result Value Ref Range   pH, Ven 7.347 7.25 - 7.43   pCO2, Ven 49.6 44 - 60 mmHg   pO2, Ven 35 32 - 45 mmHg   Bicarbonate 27.2 20.0 - 28.0 mmol/L   TCO2 29 22 - 32 mmol/L   O2 Saturation 62 %   Acid-Base Excess 1.0 0.0 - 2.0 mmol/L   Sodium 142 135 - 145 mmol/L   Potassium 4.3 3.5 - 5.1 mmol/L   Calcium, Ion 1.12 (L) 1.15 - 1.40 mmol/L   HCT 36.0 (L) 39.0 - 52.0 %   Hemoglobin 12.2 (L) 13.0 - 17.0 g/dL   Sample type VENOUS   I-Stat venous blood gas, ED     Status: Abnormal   Collection Time: 07/25/22 12:41 AM  Result Value Ref Range   pH, Ven 7.348 7.25 - 7.43   pCO2, Ven 50.1 44 - 60 mmHg   pO2, Ven 41 32 - 45 mmHg   Bicarbonate 27.6 20.0 - 28.0 mmol/L   TCO2 29 22 - 32 mmol/L   O2 Saturation 72 %   Acid-Base Excess 1.0 0.0 - 2.0 mmol/L   Sodium 139 135 - 145 mmol/L   Potassium 6.4 (HH) 3.5 - 5.1 mmol/L   Calcium, Ion 1.06 (L) 1.15 - 1.40 mmol/L   HCT 36.0 (L) 39.0 - 52.0 %   Hemoglobin 12.2 (L) 13.0 - 17.0 g/dL   Sample type VENOUS    Comment NOTIFIED PHYSICIAN   Troponin I (High Sensitivity)  Status: Abnormal   Collection Time: 07/25/22  2:59 AM  Result Value Ref Range   Troponin I (High Sensitivity) 41 (H) <18 ng/L    Comment: (NOTE) Elevated high sensitivity troponin I (hsTnI) values and significant  changes across serial measurements may suggest ACS but many other  chronic and acute conditions are known to elevate hsTnI results.  Refer to the "Links" section for chest pain algorithms and additional  guidance. Performed at [facility], [address] [place], [place] [address]   Basic Metabolic Panel (BMET)     Status: Abnormal   Collection Time: 07/26/22 11:04 AM  Result Value Ref Range   Sodium 139 135 - 145 mEq/L   Potassium 4.7 3.5 - 5.1 mEq/L   Chloride 106 96 -  112 mEq/L   CO2 24 19 - 32 mEq/L   Glucose, Bld 84 70 - 99 mg/dL   BUN 27 (H) 6 - 23 mg/dL   Creatinine, Ser 8.11 (H) 0.40 - 1.50 mg/dL   GFR 91.47 (L) >82.95 mL/min    Comment: Calculated using the CKD-EPI Creatinine Equation (2021)   Calcium 9.2 8.4 - 10.5 mg/dL        Garner Nash, MD, MS

## 2022-08-08 NOTE — Assessment & Plan Note (Signed)
Blood pressure elevated; restart carvedilol at 6.25 mg twice daily Referral to specialist blood pressure clinic pending Coordination with care management for scheduling appointments

## 2022-08-10 ENCOUNTER — Telehealth: Payer: Self-pay | Admitting: Family Medicine

## 2022-08-10 NOTE — Telephone Encounter (Signed)
[facility] [phone] ex [phone]  They are rejecting this referral and he is blocked from reschedule due to 6 no shows and cancellations.

## 2022-08-11 ENCOUNTER — Telehealth: Payer: Self-pay | Admitting: *Deleted

## 2022-08-11 NOTE — Telephone Encounter (Signed)
   Name: LAJUANE LENDER  DOB: [birth year]  MRN: [id number]  Primary Cardiologist: Chrystie Nose, MD  Chart reviewed as part of pre-operative protocol coverage. Because of [place]'s past medical history and time since last visit, he will require a follow-up in-office visit in order to better assess preoperative cardiovascular risk.  Pre-op covering staff: - Please schedule appointment and call patient to inform them. If patient already had an upcoming appointment within acceptable timeframe, please add "pre-op clearance" to the appointment notes so provider is aware. - Please contact requesting surgeon's office via preferred method (i.e, phone, fax) to inform them of need for appointment prior to surgery.    Napoleon Form, Leodis Rains, NP  08/11/2022, 11:00 AM

## 2022-08-11 NOTE — Progress Notes (Unsigned)
Cardiology Office Note:    Date:  08/16/2022   ID:  Kevin [name], DOB [birth year], MRN [id number]  PCP:  [name], MD  Cardiologist:  Chrystie Nose, MD  Electrophysiologist:  None   Referring MD: [name], MD   Chief Complaint: pre-op evaluation  History of Present Illness:    Kevin [name] is a 67 y.o. male with a history of normal coronaries on cardiac catheterization in 10/2016, chronic diastolic CHF, paroxysmal atrial fibrillation not on anticoagulation, PAD s/p atherectomy and angioplasty of mid right SFA in 07/2016, hypertension, CKD stage III, COPD, obstructive sleep apnea on BiPAP, chronic back pain, renal cell carcinoma, and HIV who is followed by Dr. Rennis Golden and presents today for pre-op evaluation for upcoming colonoscopy.   Patient was first seen by Cardiology during an admission in 01/2016 for hypertensive emergency and acute diastolic CHF. Echo during admission showed LVEF of 65-70% with grade 1 diastolic dysfunction. He was also noted to be in atrial fibrillation in arrival but converted to normal sinus rhythm. He was not started on anticoagulation given low CHA2DS2-VASc score and poor medical follow-up. He has not had any documented recurrent atrial fibrillation since 02/2016. Myoview was performed in 03/2016 for further evaluation of dyspnea and showed a fixed defect in the basal inferior, mid inferior and apical inferior location consistent with an inferior wall MI. He underwent peripheral angiography with Dr. Allyson Sabal in 07/2016 and underwent a directional atherectomy followed by drug eluting balloon angioplasty of a high-grade mid right SFA stenosis. Repeat lower extremity dopplers later that month showed improvement in blood flow to right leg. ABI was 0.82 on the right and 0.95 on the left. Left cardiac catheterization was performed in 10/2016 due to progressive dyspnea one exertion, chest tightness, and fatigue and showed normal coronaries as well as normal LV  function and LVEDP. He was admitted in 03/2019 for sudden onset of shortness of breath. High-sensitivity troponin was minimally elevated and flat in the 60s. Echo showed LVEF of 55-60% with normal wall motion and grade 1 diastolic dysfunction, normal RV, and no significant valvular disease. Myoview showed a large fixed inferior wall defect extending to the apex similar to what was seen on Myoview in 2018 prior to cardiac catheterization.  Patient was last seen by Dr. Rennis Golden in 04/2021 at which time he was stable from a cardiac standpoint with no worsening shortness of breath or chest pain.  He was admitted in 12/2021 for hypertensive urgency and possible COPD exacerbation after presenting with shortness of breath and chest pain with coughing after running out of his medicaitons. UDS was positive for cocaine. High-sensitivity troponin was minimally elevated and flat in the 70s consistent with demand ischemia. Echo showed LVEF of 60-65% with normal wall motion, moderate LVH, and grade 1 diastolic dysfunction. He was initially treated with a Nicardipine drip and home medications were resumed with improvement in BP.  He has also had multiple ED visits over the last several months. He has had 6 ED visits since 04/2022 alone: one for back/ chest pain (felt to be musculoskeletal in nature), one severe hypertension and abnormal EKG (although no changes from baseline), and four visits for COPD exacerbations. During these visits, BP often markedly elevated with systolic BP often in the 190s to 200s. During ED visit on 07/19/2022 for COPD exacerbation, he also reported bloody stools and hemoccult was positive but this was felt to likely be due to hemorrhoids which were noticed on exam. Hemoglobin was  stable. He already had an outpatient colonoscopy planned so he was felt to be stable for discharge. He was most recently seen in the ED on 07/25/2022 for acute respiratory distress secondary to COPD exacerbation after presenting  with shortness of breath and chest tightness. EKG showed normal sinus rhythm with LVH and T wave inversions in inferior leads and lead V6 but no acute changes compared to prior tracings. High-sensitivity troponin minimally elevated and flat in the 40s (troponin consistently chronically elevated in this range). BNP was normal. Chest x-ray showed very mild bilateral infrahilar atelectasis. He was treated with an hour-long nebulizer with significant improvement in symptoms and then was felt to be stable for discharge. He was discharged on Doxycyline and Prednisone. He was seen by Pulmonology the following day and was restarted on maintenance therapy.   Patient presents today for pre-op evaluation. He was in the ED again yesterday for another COPD exacerbation and was again treated with breathing treatments and steroids. He was discharged on a short course of Prednisone and is feeling much better today. He states Prednisone always does the trick. He states it always happens the same. He will go to bed an feel okay and then wake up incredibly short of breath with a productive cough. However, his symptoms always seem to resolved after steroids but then come back. He feels fine today which he states is because of the Prednisone. He has significant dyspnea on exertion due to his COPD but again this always improves with the Prednisone. He also describes trouble breathing at as above but again this sounds like it is due to COPD rather than true orthopnea or PND. He denies any edema. He has chest some chest tightness associated with his COPD exacerbation but never outside of these times. He denies any significant palpitations, dizziness, or syncope. However, describes 2 episodes in the last week where his left eye went "completely black" for 15 minutes and then returned. The last episode occurred yesterday. He states he mentioned this to the ED providers but states he was told that they were not concerned about his vision  right now. This sounds concerning for amaurosis fugax to me. He denies any other stroke symptoms. He also complains of severe leg pain when walking which sounds like claudication. BP is elevated in the office today - initially 178/78 and then 162/90 on my recheck which patient states is good for him.  Patient is under a lot of stress right now. His 53 year old daughter was recently admitted for a "heart attack." However, it sounds like she actually had a VF/VT arrest and is having a defibrillator placed today.  EKGs/Labs/Other Studies Reviewed:    The following studies were reviewed:  Myoview 03/31/2016: The left ventricular ejection fraction is mildly decreased (45-54%). The LV is moderately dilated. Nuclear stress EF: 47%. Defect 1: There is a medium defect of moderate severity present in the basal inferior, mid inferior and apical inferior location. This is consistent with an inferior wall MI. This is an intermediate risk study.   _______________  Left Cardiac Catheterization 10/23/2016: Angiographically normal coronary arteries. Left dominant system Normal LV function with mildly elevated LVEDP The left ventricular ejection fraction is 55-65% by visual estimate.   With normal LV function, LVEDP and normal coronaries, would recommend evaluating for noncardiac etiology for dyspnea. _______________  Myoview 04/04/2019: Impression: 1. Large fixed inferior wall defect extending to the apex. Apical component appears larger on stress imaging suspicious for small amount of apical peri-infarct inducible  ischemia. 2. Septal hypokinesis. 3. Left ventricular ejection fraction 50% 4. Non invasive risk stratification*: Intermediate _______________  Echocardiogram 12/27/2021: Impressions: 1. Left ventricular ejection fraction, by estimation, is 60 to 65%. The  left ventricle has normal function. The left ventricle has no regional  wall motion abnormalities. There is moderate concentric left  ventricular  hypertrophy. Left ventricular  diastolic parameters are consistent with Grade I diastolic dysfunction  (impaired relaxation).   2. Right ventricular systolic function is normal. The right ventricular  size is normal. There is mildly elevated pulmonary artery systolic  pressure.   3. Left atrial size was mildly dilated.   4. The mitral valve is normal in structure. No evidence of mitral valve  regurgitation. No evidence of mitral stenosis.   5. The aortic valve is grossly normal. Aortic valve regurgitation is not  visualized. No aortic stenosis is present.   6. The inferior vena cava is normal in size with greater than 50%  respiratory variability, suggesting right atrial pressure of 3 mmHg.   Comparison(s): No significant change from prior study.   EKG:  EKG ordered today.   EKG Interpretation Date/Time:  Wednesday August 16 2022 13:49:51 EDT Ventricular Rate:  65 PR Interval:  178 QRS Duration:  90 QT Interval:  414 QTC Calculation: 430 R Axis:   79  Text Interpretation: Normal sinus rhythm Left ventricular hypertrophy with early repolariation changes. Mild ST depression and T wave inversions in inferior leads. T wave inversions in lead V6. When compared with ECG of 16-Aug-2022 13:49, No significant change was found Confirmed by Marjie Skiff [phone]) on 08/16/2022 2:00:57 PM     Recent Labs: 04/28/2022: Magnesium 2.0; TSH 1.28 07/25/2022: ALT 16; B Natriuretic Peptide 18.8 08/15/2022: BUN 21; Creatinine, Ser 1.90; Hemoglobin 11.5; Platelets 310; Potassium 4.4; Sodium 138  Recent Lipid Panel    Component Value Date/Time   CHOL 184 07/06/2022 1145   TRIG 116 07/06/2022 1145   HDL 52 07/06/2022 1145   CHOLHDL 3.5 07/06/2022 1145   VLDL 41 (H) 04/04/2019 0458   LDLCALC 110 (H) 07/06/2022 1145    Physical Exam:    Vital Signs: BP (!) 162/90   Pulse 77   Ht 6' (1.829 m)   Wt 158 lb 12.8 oz (72 kg)   SpO2 95%   BMI 21.54 kg/m     Wt Readings from Last 3  Encounters:  08/16/22 158 lb 12.8 oz (72 kg)  08/15/22 162 lb 14.7 oz (73.9 kg)  08/08/22 163 lb (73.9 kg)     General: 67 y.o. African-American male in no acute distress. HEENT: Normocephalic and atraumatic. Sclera clear.  Neck: Supple. No carotid bruits. No JVD. Heart: RRR. Distinct S1 and S2. No murmurs, gallops, or rubs. Lungs: No increased work of breathing. Decreased breath sounds in bilateral bases but no wheezes, rhonchi, or rales.  Abdomen: Soft, non-distended, and non-tender to palpation.  Extremities: No lower extremity edema.    Skin: Warm and dry. Neuro: Alert and oriented x3. No focal deficits. Psych: Normal affect. Responds appropriately.  Assessment:    1. Pre-op evaluation   2. Chronic diastolic CHF (congestive heart failure) (HCC)   3. Paroxysmal atrial fibrillation (HCC)   4. PAD (peripheral artery disease) (HCC)   5. Essential hypertension   6. Hyperlipidemia, unspecified hyperlipidemia type   7. Stage 3 chronic kidney disease, unspecified whether stage 3a or 3b CKD (HCC)   8. Chronic obstructive pulmonary disease, unspecified COPD type (HCC)   9. Tobacco abuse  10. Transient vision loss     Plan:    Pre-Op Evaluation Patient has upcoming colonoscopy planned for later this month. He is stable from a cardiac standpoint for this low risk procedure. However, I am much more concerned about his frequent COPD exacerbations. He has had 5 ED visits for this since 04/2022 (the most recent one being yesterday). It also sounds like like he may of had two TIAs / amaurosis fugax events over the last week with the most recent one being yesterday. I have started a work-up for this as below with carotid dopplers and cardiac monitor and got him in to see his PCP later this week for any additional work-up such as head imaging. I strongly recommend optimization of his COPD and further evaluation for possible TIA prior to proceeding with colonoscopy if it is not urgent. He states he  is not having any ongoing blood stools and hemoglobin was stable at 11.5. Will defer pre-op risk assessment from a COPD and neurologic standpoint to PCP (he may actually benefit from being seen by Pulmonology for his COPD as well - he states he has been referred to them but has not seen them yet).   Chronic Diastolic CHF Echo in 12/2021 showed LVEF of 60-65% with normal wall motion, moderate, moderate LVH, and grade 1 diastolic dysfunction.  - Euvolemic on exam. - No need for diuretics at this time. - Continue treatment for hypertension as below.  Paroxysmal Atrial Fibrillation Initially diagnosed during an admission in 01/2016 for hypertensive emergency and CHF. He converted back to normal sinus rhythm at that time and was not started on anticoagulation due to low CHA2DS2-VASc score and poor medical follow-up. He has not had any recurrent atrial fibrillation since 02/2016 so has not been on anticoagulation.  - Maintaining sinus rhythm.  - Continue Coreg 6.25mg  twice daily.  - Not on anticoagulation as above. If he has any documented recurrence, will need to consider anticoagulation.  - Will order 30 Day Event Monitor to assess for recurrent atrial fibrillation given concern for TIA/ amamaurosis fugax.  PAD S/p atherectomy and angioplasty of mid right SFA in 07/2016. Repeat lower extremity dopplers later that month showed improvement in blood flow to right leg. ABI was 0.82 on the right and 0.95 on the left.  - He reports severe bilateral leg pain concerning for claudication. - Continue Aspirin 81mg  daily and Lipitor 40mg  daily. Of note, I did not realize until after visit that he reported not taking this because he reportedly "never got them". Will have him start these.  - Will repeat lower extremity arterial dopplers and ABIs.   Hypertension Patient has a long history of difficult resistant hypertension. BP elevated in the office today. Initially 178/78 and then 162/90 on my personal recheck at  the end of the visit. Patient states this is good for him. He states he is compliant with this medications.  - Current medications: Amlodipine 10mg  daily, Coreg 6.25mg  twice daily, and Valsartan 160mg  daily. Will stop Coreg and switch him to Lopressor 25mg  twice daily given recurrent COPD exacerbation (Coreg is better for BP but given significant issues with COPD, I think a cardioselective beta-blocker would be better). Will also start Hydralazine 25mg  three times daily.  - He would benefit for work-up of secondary causes of hypertension. However, I did not get a chance to discuss this today given more acute concerns. I think he is going to need a lot of education - I had to re-explain myself multiple  times. His PCP already referred him to the [facility] and he is scheduled to see Gillian Shields, NP,  on 10/05/2022. Will arrange follow-up prior to this (in 3-4 weeks) so that we can continue to adjust medications.  Hyperlipidemia Lipid panel in 06/2022: Total Cholesterol 184, Triglycerides 116, HDL 52, LDL 110. LDL goal <70 given PAD. - He has Lipitor 40mg  daily prescribed. However, after visit, I realized that he is not taking this because he never got it. We will call him and have him start this. He will then need a repeat lipid panel and LFTs in about 2-3 months.  CKD Stage III Baseline creatinine around 2.0. Stable at 2.04 on most recent labs on 07/26/2022. Suspect this is largely due to longstanding uncontrolled hypertension.   COPD Tobacco Abuse Patient has had multiple ED visits for COPD exacerbation as described above. Most recently seen in the ED yesterday. He has reportedly been referred to Pulmonology but is waiting to be seen by them.  - Breathing much improved with Prednisone.  - He does continue to smoke and I discussed the importance of complete cessation. - Management per PCP and Pulmonology.  Transient Vision Loss Concern for TIA/ Amaurosis Fugax Patient reports 2 episodes  within the last week where the vision in his left eye went completely black for 15 minutes and then returned. He states the last episode occurred yesterday. He states he mentioned this while he was in the ED yesterday for a COPD exacerbation and was reportedly told that they were not concerned about his vision at that time. Symptoms sound concerning for possible TIA or amaurosis fugax. He denies any other stroke symptoms.  - Will order carotid dopplers.  - Will order Event Monitor given prior history of atrial fibrillation. He has not been on anticoagulation given no documented recurrent since 2018.  - He needs to follow-up with PCP soon for further evaluation of this. Suspect he will need some form of head imaging (possible brain MRA).  He states he has previously been referred to Ophthalmology (but this was prior to these 2 events) and he is waiting for an appointment. We were able to call his PCP's office and get him an appointment later this week on 08/18/2022. Will send patient's PCP a staff message notifying him of this as well. However, I emphasized the importance of calling 911 or going to the ED if he has recurrent vision loss or any other stroke like symptoms.    Disposition: Follow up in 3-4 weeks.   Total Time: I spent at least 75 minutes reviewing records, evaluating patient, formulating plan, and documenting on day of visit. Staff also spent additional time arranging additional work-up and close follow-up with different providers.   Medication Adjustments/Labs and Tests Ordered: Current medicines are reviewed at length with the patient today.  Concerns regarding medicines are outlined above.  Orders Placed This Encounter  Procedures   CARDIAC EVENT MONITOR   EKG 12-Lead   EKG 12-Lead   VAS [name] ABI WITH/WO TBI   VAS US CAROTID   LOWER EXTREMITY ARTERIAL DUPLEX BILAT (BACK OFFICE)   VAS [name] LOWER EXTREMITY ARTERIAL DUPLEX   Meds ordered this encounter  Medications   amLODipine  (NORVASC) 10 MG tablet    Sig: Take 1 tablet (10 mg total) by mouth daily.    Dispense:  90 tablet    Refill:  3   hydrALAZINE (APRESOLINE) 25 MG tablet    Sig: Take 1 tablet (25 mg total)  by mouth 3 (three) times daily.    Dispense:  270 tablet    Refill:  3   metoprolol tartrate (LOPRESSOR) 25 MG tablet    Sig: Take 1 tablet (25 mg total) by mouth 2 (two) times daily.    Dispense:  180 tablet    Refill:  3    Patient Instructions  Medication Instructions:  STOP THE COREG 6.25MG .  START METOPROLOL TARTRATE 25 TWO TIMES DAILY START HYDRALAZINE THREE TIMES DAILY *If you need a refill on your cardiac medications before your next appointment, please call your pharmacy*   Lab Work: NONE If you have labs (blood work) drawn today and your tests are completely normal, you will receive your results only by: MyChart Message (if you have MyChart) OR A paper copy in the mail If you have any lab test that is abnormal or we need to change your treatment, we will call you to review the results.   Testing/Procedures: [employer] Cardiac Event Monitor Instructions  Your physician has requested you wear your cardiac event monitor for __30___ days [employer] may call or text to confirm a shipping address. The monitor will be sent to a land address via UPS. [employer] will not ship a monitor to a PO BOX. It typically takes 3-5 days to receive your monitor after it has been enrolled. [employer] will assist with USPS tracking if your package is delayed. The telephone number for [employer] is [phone]. Once you have received your monitor, please review the enclosed instructions. Instruction tutorials can also be viewed under help and settings on the enclosed cell phone. Your monitor has already been registered assigning a specific monitor serial # to you.  Billing and Self Pay Discount Information  [employer] has been provided the insurance information we had on file for you.  If your  insurance has been updated, please call [employer] at [phone] to provide them with your updated insurance information.   [employer] offers a discounted Self Pay option for patients who have insurance that does not cover their cardiac event monitor or patients without insurance.  The discounted cost of a Self Pay Cardiac Event Monitor would be $225.00 , if the patient contacts [employer] at [phone] within 7 days of applying the monitor to make payment arrangements.  If the patient does not contact [employer] within 7 days of applying the monitor, the cost of the cardiac event monitor will be $350.00.  Applying the monitor  Remove cell phone from case and turn it on. The cell phone works as IT consultant and needs to be within [employer] of you at all times. The cell phone will need to be charged on a daily basis. We recommend you plug the cell phone into the enclosed charger at your bedside table every night.  Monitor batteries: You will receive two monitor batteries labelled #1 and #2. These are your recorders. Plug battery #2 onto the second connection on the enclosed charger. Keep one battery on the charger at all times. This will keep the monitor battery deactivated. It will also keep it fully charged for when you need to switch your monitor batteries. A small light will be blinking on the battery emblem when it is charging. The light on the battery emblem will remain on when the battery is fully charged.  Open package of a Monitor strip. Insert battery #1 into black hood on strip and gently squeeze monitor battery onto connection as indicated in instruction booklet. Set aside while preparing skin.  Choose location for  your strip, vertical or horizontal, as indicated in the instruction booklet. Shave to remove all hair from location. There cannot be any lotions, oils, powders, or colognes on skin where monitor is to be applied. Wipe skin clean with enclosed Saline wipe. Dry skin  completely.  Peel paper labeled #1 off the back of the Monitor strip exposing the adhesive. Place the monitor on the chest in the vertical or horizontal position shown in the instruction booklet. One arrow on the monitor strip must be pointing upward. Carefully remove paper labeled #2, attaching remainder of strip to your skin. Try not to create any folds or wrinkles in the strip as you apply it.  Firmly press and release the circle in the center of the monitor battery. You will hear a small beep. This is turning the monitor battery on. The heart emblem on the monitor battery will light up every 5 seconds if the monitor battery in turned on and connected to the patient securely. Do not push and hold the circle down as this turns the monitor battery off. The cell phone will locate the monitor battery. A screen will appear on the cell phone checking the connection of your monitor strip. This may read poor connection initially but change to good connection within the next minute. Once your monitor accepts the connection you will hear a series of 3 beeps followed by a climbing crescendo of beeps. A screen will appear on the cell phone showing the two monitor strip placement options. Touch the picture that demonstrates where you applied the monitor strip.  Your monitor strip and battery are waterproof. You are able to shower, bathe, or swim with the monitor on. They just ask you do not submerge deeper than 3 feet underwater. We recommend removing the monitor if you are swimming in a lake, river, or ocean.  Your monitor battery will need to be switched to a fully charged monitor battery approximately once a week. The cell phone will alert you of an action which needs to be made.  On the cell phone, tap for details to reveal connection status, monitor battery status, and cell phone battery status. The green dots indicates your monitor is in good status. A red dot indicates there is something  that needs your attention.  To record a symptom, click the circle on the monitor battery. In 30-60 seconds a list of symptoms will appear on the cell phone. Select your symptom and tap save. Your monitor will record a sustained or significant arrhythmia regardless of you clicking the button. Some patients do not feel the heart rhythm irregularities. [employer] will notify us of any serious or critical events.  Refer to instruction booklet for instructions on switching batteries, changing strips, the Do not disturb or Pause features, or any additional questions.  Call [employer] at [phone], to confirm your monitor is transmitting and record your baseline. They will answer any questions you may have regarding the monitor instructions at that time.  Returning the monitor to [employer]  Place all equipment back into blue box. Peel off strip of paper to expose adhesive and close box securely. There is a prepaid UPS shipping label on this box. Drop in a UPS drop box, or at a UPS facility like [employer]. You may also contact [employer] to arrange UPS to pick up monitor package at your home.   Your physician has requested that you have a carotid duplex. This test is an ultrasound of the carotid arteries in your neck. It looks at  blood flow through these arteries that supply the brain with blood. Allow one hour for this exam. There are no restrictions or special instructions.   Your physician has requested that you have an ankle brachial index (ABI). During this test an ultrasound and blood pressure cuff are used to evaluate the arteries that supply the arms and legs with blood. Allow thirty minutes for this exam. There are no restrictions or special instructions.   Your physician has requested that you have a lower extremity arterial duplex. This test is an ultrasound of the arteries in the legs or arms. It looks at arterial blood flow in the legs and arms. Allow one hour for Lower and Upper  Arterial scans. There are no restrictions or special instructions    Follow-Up: At [facility], you and your health needs are our priority.  As part of our continuing mission to provide you with exceptional heart care, we have created designated Provider Care Teams.  These Care Teams include your primary Cardiologist (physician) and Advanced Practice Providers (APPs -  Physician Assistants and Nurse Practitioners) who all work together to provide you with the care you need, when you need it.  We recommend signing up for the patient portal called "MyChart".  Sign up information is provided on this After Visit Summary.  MyChart is used to connect with patients for Virtual Visits (Telemedicine).  Patients are able to view lab/test results, encounter notes, upcoming appointments, etc.  Non-urgent messages can be sent to your provider as well.   To learn more about what you can do with MyChart, go to [email or web address].    Your next appointment:   FOLLOW UP WITH YOUR PRIMARY CARE PROVIDER   Provider:       Leanne Lovely, PA-C  08/16/2022 8:27 PM    [facility]

## 2022-08-11 NOTE — Telephone Encounter (Addendum)
Pt has been scheduled for in office sooner appt with Marjie Skiff, [facility] 08/16/22 @ 1:55. Pt tells me that he is up at [facility] hospital with right now as his daughter had a heart attack yesterday. I told the pt that we will keep him and his daughter in our thoughts. Pt said thank you. I did not cancel the 09/2022 appt with Gillian Shields, NP. I felt that this could be decided by Marjie Skiff, [facility] when she see' the pt 08/16/22.   Pt asked if I could send info about the appt to [facility] CHART as he has to provide this information the transportation facility he uses.  APPT: 08/16/22 @ 1:55 PM PROVIDER: Marjie Skiff, PAC LOCATION: [address] [place], [place] [address] PH#: [phone]  I will update all parties involved.

## 2022-08-11 NOTE — Telephone Encounter (Signed)
   Pre-operative Risk Assessment    Patient Name: Kevin [name]  DOB: 08/16/1955 MRN: [id number]    DR. MENON IS REQUESTING RECORDS ALONG WITH PRE OP CLEARANCE. IN REGARD TO THE RECORDS BEING REQUESTED I WILL FAX THE CLEARANCE TO OUR HIM DEPT.   Request for Surgical Clearance    Procedure:   COLONOSCOPY  Date of Surgery:  Clearance 08/28/22                                 Surgeon:  DR. Carney Harder Surgeon's Group or Practice Name:  [facility] Phone number:  [phone] ATTN: Bing Neighbors, RMA Fax number:  [phone]   Type of Clearance Requested:   - Medical ; NO MEDICATIONS LISTED AS NEEDING TO BE HELD   Type of Anesthesia:  MAC   Additional requests/questions:    Elpidio Anis   08/11/2022, 10:46 AM

## 2022-08-14 ENCOUNTER — Telehealth: Payer: Self-pay | Admitting: Family Medicine

## 2022-08-14 ENCOUNTER — Other Ambulatory Visit (HOSPITAL_COMMUNITY): Payer: Self-pay

## 2022-08-14 NOTE — Telephone Encounter (Signed)
Toniann Fail from [facility] stated the pt was referred to them but they do not accept his insurance

## 2022-08-15 ENCOUNTER — Encounter (HOSPITAL_COMMUNITY): Payer: Self-pay

## 2022-08-15 ENCOUNTER — Other Ambulatory Visit: Payer: Self-pay

## 2022-08-15 ENCOUNTER — Emergency Department (HOSPITAL_COMMUNITY): Payer: Medicaid Other

## 2022-08-15 ENCOUNTER — Emergency Department (HOSPITAL_COMMUNITY)
Admission: EM | Admit: 2022-08-15 | Discharge: 2022-08-15 | Disposition: A | Discharge status: Home / Self Care | Payer: Medicaid Other | Attending: Emergency Medicine | Admitting: Emergency Medicine

## 2022-08-15 DIAGNOSIS — J441 Chronic obstructive pulmonary disease with (acute) exacerbation: Secondary | ICD-10-CM | POA: Diagnosis not present

## 2022-08-15 DIAGNOSIS — N189 Chronic kidney disease, unspecified: Secondary | ICD-10-CM | POA: Insufficient documentation

## 2022-08-15 DIAGNOSIS — Z7982 Long term (current) use of aspirin: Secondary | ICD-10-CM | POA: Insufficient documentation

## 2022-08-15 DIAGNOSIS — I129 Hypertensive chronic kidney disease with stage 1 through stage 4 chronic kidney disease, or unspecified chronic kidney disease: Secondary | ICD-10-CM | POA: Diagnosis not present

## 2022-08-15 DIAGNOSIS — Z7951 Long term (current) use of inhaled steroids: Secondary | ICD-10-CM | POA: Insufficient documentation

## 2022-08-15 DIAGNOSIS — Z79899 Other long term (current) drug therapy: Secondary | ICD-10-CM | POA: Insufficient documentation

## 2022-08-15 DIAGNOSIS — R0602 Shortness of breath: Secondary | ICD-10-CM | POA: Diagnosis present

## 2022-08-15 DIAGNOSIS — F17200 Nicotine dependence, unspecified, uncomplicated: Secondary | ICD-10-CM | POA: Insufficient documentation

## 2022-08-15 LAB — BASIC METABOLIC PANEL
Anion gap: 11 (ref 5–15)
BUN: 21 mg/dL (ref 8–23)
CO2: 21 mmol/L — ABNORMAL LOW (ref 22–32)
Calcium: 9 mg/dL (ref 8.9–10.3)
Chloride: 106 mmol/L (ref 98–111)
Creatinine, Ser: 1.9 mg/dL — ABNORMAL HIGH (ref 0.61–1.24)
GFR, Estimated: 39 mL/min — ABNORMAL LOW (ref >60)
Glucose, Bld: 95 mg/dL (ref 70–99)
Potassium: 4.4 mmol/L (ref 3.5–5.1)
Sodium: 138 mmol/L (ref 135–145)

## 2022-08-15 LAB — CBC
HCT: 34.1 % — ABNORMAL LOW (ref 39.0–52.0)
Hemoglobin: 11.5 g/dL — ABNORMAL LOW (ref 13.0–17.0)
MCH: 31.5 pg (ref 26.0–34.0)
MCHC: 33.7 g/dL (ref 30.0–36.0)
MCV: 93.4 fL (ref 80.0–100.0)
Platelets: 310 10*3/uL (ref 150–400)
RBC: 3.65 MIL/uL — ABNORMAL LOW (ref 4.22–5.81)
RDW: 14.6 % (ref 11.5–15.5)
WBC: 8.8 10*3/uL (ref 4.0–10.5)
nRBC: 0 % (ref 0.0–0.2)

## 2022-08-15 MED ORDER — IPRATROPIUM BROMIDE 0.02 % IN SOLN
0.5000 mg | Freq: Once | RESPIRATORY_TRACT | Status: AC
  Administered 2022-08-15: 0.5 mg via RESPIRATORY_TRACT
  Filled 2022-08-15: qty 2.5

## 2022-08-15 MED ORDER — PREDNISONE 50 MG PO TABS: 50.0000 mg | ORAL_TABLET | Freq: Every day | ORAL | 0 refills | Status: AC

## 2022-08-15 MED ORDER — ALBUTEROL SULFATE (2.5 MG/3ML) 0.083% IN NEBU
2.5000 mg | INHALATION_SOLUTION | Freq: Once | RESPIRATORY_TRACT | Status: AC
  Administered 2022-08-15: 2.5 mg via RESPIRATORY_TRACT
  Filled 2022-08-15: qty 3

## 2022-08-15 MED ORDER — ALBUTEROL SULFATE HFA 108 (90 BASE) MCG/ACT IN AERS
2.0000 | INHALATION_SPRAY | RESPIRATORY_TRACT | Status: DC | PRN
  Administered 2022-08-15: 2 via RESPIRATORY_TRACT
  Filled 2022-08-15: qty 6.7

## 2022-08-15 MED ORDER — IPRATROPIUM BROMIDE 0.02 % IN SOLN
0.5000 mg | Freq: Four times a day (QID) | RESPIRATORY_TRACT | Status: DC
  Administered 2022-08-15: 0.5 mg via RESPIRATORY_TRACT
  Filled 2022-08-15: qty 2.5

## 2022-08-15 MED ORDER — PREDNISONE 5 MG PO TABS
50.0000 mg | ORAL_TABLET | Freq: Every day | ORAL | Status: DC
  Administered 2022-08-15: 50 mg via ORAL
  Filled 2022-08-15: qty 2

## 2022-08-15 MED ORDER — IPRATROPIUM-ALBUTEROL 0.5-2.5 (3) MG/3ML IN SOLN: 3.0000 mL | Freq: Four times a day (QID) | RESPIRATORY_TRACT | 0 refills | Status: DC | PRN

## 2022-08-15 MED ORDER — IPRATROPIUM-ALBUTEROL 0.5-2.5 (3) MG/3ML IN SOLN
RESPIRATORY_TRACT | Status: AC
  Administered 2022-08-15: 3 mL
  Filled 2022-08-15: qty 9

## 2022-08-15 NOTE — ED Notes (Signed)
Pt's BP was noted to be elevated.  PT stated that he had not taken his BP medication. Dr is aware prior to discharge.

## 2022-08-15 NOTE — ED Triage Notes (Signed)
Pt arrived from home via POV c/o sob that started when pt awoke this am.

## 2022-08-15 NOTE — Telephone Encounter (Signed)
Referral moved to [facility] care at [place]

## 2022-08-15 NOTE — ED Notes (Signed)
Provider at bedside

## 2022-08-15 NOTE — ED Provider Notes (Signed)
[name] Provider Note   CSN: [id number] Arrival date & time: 08/15/22  5366     History  Chief Complaint  Patient presents with   Shortness of Breath   Kevin [name] is a 67 year old male with a history of hypertension, COPD, and CKD. He presented to the Emergency Department with complaints of shortness of breath with exertion. He reports mucus production and cough. He denies fevers or chest pain or being around sick contacts.  He attempted a breathing treatment at home but did not notice any improvement in his respiratory symptoms.  He is a chronic smoker.    Home Medications Prior to Admission medications   Medication Sig Start Date End Date Taking? Authorizing Provider  albuterol (VENTOLIN HFA) 108 (90 Base) MCG/ACT inhaler Inhale 2 puffs into the lungs every 4 (four) hours as needed for wheezing or shortness of breath. 07/25/22   Tilden Fossa, MD  amLODipine (NORVASC) 10 MG tablet Take 10 mg by mouth daily.    [provider]  aspirin EC 81 MG tablet Take 1 tablet (81 mg total) by mouth daily. Swallow whole. Patient not taking: Reported on 08/08/2022 04/28/22   [name], MD  atorvastatin (LIPITOR) 40 MG tablet Take 1 tablet (40 mg total) by mouth daily. Patient not taking: Reported on 08/08/2022 04/28/22   [name], MD  benzonatate (TESSALON) 200 MG capsule Take 1 capsule (200 mg total) by mouth 3 (three) times daily as needed for cough. Patient not taking: Reported on 08/08/2022 07/26/22   [name], NP  carvedilol (COREG) 6.25 MG tablet Take 1 tablet (6.25 mg total) by mouth 2 (two) times daily with a meal. 08/08/22 08/08/23  [name], MD  dextromethorphan-guaiFENesin [facility] DM) 30-600 MG 12hr tablet Take 1 tablet by mouth 2 (two) times daily. 08/08/22 08/03/23  [name], MD  dolutegravir-lamiVUDine (DOVATO) 50-300 MG tablet Take 1 tablet by mouth daily. 07/06/22   Randall Hiss, MD  doxycycline (VIBRAMYCIN) 100 MG capsule Take 1 capsule (100 mg total) by mouth 2 (two) times daily. 07/25/22   Tilden Fossa, MD  FEROSUL 325 (65 Fe) MG tablet Take 325 mg by mouth daily. 06/01/22   [provider]  Fluticasone-Umeclidin-Vilant (TRELEGY ELLIPTA) 100-62.5-25 MCG/ACT AEPB Inhale 1 puff into the lungs daily. 07/26/22   Cobb, Ruby Cola, NP  guaiFENesin-dextromethorphan (ROBITUSSIN DM) 100-10 MG/5ML syrup Take 5 mLs by mouth every 6 (six) hours as needed for cough. Patient not taking: Reported on 08/07/2022 07/26/22   [name], NP  ipratropium-albuterol (DUONEB) 0.5-2.5 (3) MG/3ML SOLN Take 3 mLs by nebulization every 6 (six) hours as needed. 11/12/21   White, Elita Boone, NP  nicotine (NICODERM CQ - DOSED IN MG/24 HOURS) 14 mg/24hr patch Place 1 patch (14 mg total) onto the skin daily. Patient not taking: Reported on 08/07/2022 07/26/22   [name], NP  oxyCODONE-acetaminophen (PERCOCET) 7.5-325 MG tablet Take 1 tablet by mouth 3 (three) times daily. 06/22/22   [provider]  valsartan (DIOVAN) 160 MG tablet TAKE 1 TABLET(160 MG) BY MOUTH DAILY 07/26/22   [name], MD      Allergies    Patient has no known allergies.    Review of Systems   Review of Systems  Respiratory:  Positive for shortness of breath.     Physical Exam Updated Vital Signs BP (!) 135/108 (BP Location: Right Arm)   Pulse 84  Temp 98.3 F (36.8 C)   Resp 20   Ht 6' (1.829 m)   Wt 73.9 kg   SpO2 96%   BMI 22.10 kg/m  Physical Exam General: Tired looking, with increased work of breathing.   CV: RRR. No murmurs, rubs, or gallops. No LE edema Pulmonary: Decreased air movement bilaterally. no wheezing or rales. Abdominal: Soft, nontender, nondistended. Normal bowel sounds. Skin: Warm and dry. No obvious rash or lesions.  ED Results / Procedures / Treatments   Labs (all labs ordered are listed, but only abnormal results are displayed) Labs Reviewed   BASIC METABOLIC PANEL - Abnormal; Notable for the following components:      Result Value   CO2 21 (*)    Creatinine, Ser 1.90 (*)    GFR, Estimated 39 (*)    All other components within normal limits  CBC - Abnormal; Notable for the following components:   RBC 3.65 (*)    Hemoglobin 11.5 (*)    HCT 34.1 (*)    All other components within normal limits    EKG EKG Interpretation Date/Time:  Tuesday August 15 2022 06:44:13 EDT Ventricular Rate:  78 PR Interval:  166 QRS Duration:  106 QT Interval:  388 QTC Calculation: 442 R Axis:   63  Text Interpretation: Normal sinus rhythm Left ventricular hypertrophy with repolarization abnormality ( Sokolow-Lyon , Cornell product , Romhilt-Estes ) Cannot rule out Septal infarct , age undetermined Abnormal ECG Confirmed by Gerhard Munch [phone]) on 08/15/2022 8:04:00 AM  Radiology DG Chest 2 View  Result Date: 08/15/2022 CLINICAL DATA:  COPD. EXAM: CHEST - 2 VIEW COMPARISON:  07/25/2022 FINDINGS: Heart size and mediastinal contours are normal. Mild central airway thickening with peribronchial cuffing. No pleural fluid, interstitial edema or airspace disease. Visualized osseous structures are unremarkable. IMPRESSION: Mild central airway thickening with peribronchial cuffing. No focal airspace disease. Electronically Signed   By: Signa Kell M.D.   On: 08/15/2022 07:36    Procedures Procedures    Medications Ordered in ED Medications  albuterol (VENTOLIN HFA) 108 (90 Base) MCG/ACT inhaler 2 puff (2 puffs Inhalation Given 08/15/22 0700)  predniSONE (DELTASONE) tablet 50 mg (has no administration in time range)  ipratropium (ATROVENT) nebulizer solution 0.5 mg (has no administration in time range)  ipratropium (ATROVENT) nebulizer solution 0.5 mg (has no administration in time range)  albuterol (PROVENTIL) (2.5 MG/3ML) 0.083% nebulizer solution 2.5 mg (has no administration in time range)    ED Course/ Medical Decision Making/ A&P                              Medical Decision Making Kevin [name] is a 67 year old gentleman with a history of hypertension, CKD and COPD who presents to the emergency department with shortness of breath. On physical exam he has decreased air movement bilaterally, and increased work of breathing.  Differential diagnosis includes COPD exacerbation vs. PNA vs ACS.  Both chest x-ray and x-ray normal. Likely diagnosis is COPD exacerbation.  He will be treated with steroids and bronchodilators.  Amount and/or Complexity of Data Reviewed Labs: ordered. Decision-making details documented in ED Course. Radiology: ordered.  Risk Prescription drug management.          Final Clinical Impression(s) / ED Diagnoses Final diagnoses:  None    Rx / DC Orders ED Discharge Orders     None      Laretta Bolster, M.D.  Internal Medicine Resident, PGY-1 Patrcia Dolly  [facility] Internal Medicine Residency  Pager: [phone] 4:57 PM, 08/15/2022      Laretta Bolster, MD 08/15/22 1658    Gerhard Munch, MD 08/22/22 [phone]

## 2022-08-15 NOTE — Progress Notes (Signed)
Potassium normalized. Needs to f/u with PCP regarding kidney function and monitoring.

## 2022-08-15 NOTE — Discharge Instructions (Addendum)
Monitor your COPD closely.  If you have fever, or worsening shortness of breath or increasing sputum production schedule a visit with your PCP.  Patient was educated on quitting smoking to slow progression of COPD.  Continue with prescribed medicines and breathing treatments.

## 2022-08-15 NOTE — ED Notes (Signed)
Pt ambulated around ED. Pt remained at 98-100% on RA. Pt in no acute distress while ambulating. Pt denies SOB while ambulating. MD notified.

## 2022-08-16 ENCOUNTER — Other Ambulatory Visit: Payer: Self-pay

## 2022-08-16 ENCOUNTER — Ambulatory Visit: Payer: Medicaid Other | Attending: Student | Admitting: Student

## 2022-08-16 ENCOUNTER — Encounter: Payer: Self-pay | Admitting: Student

## 2022-08-16 ENCOUNTER — Telehealth: Payer: Self-pay

## 2022-08-16 VITALS — BP 162/90 | HR 77 | Ht 72.0 in | Wt 158.8 lb

## 2022-08-16 DIAGNOSIS — E785 Hyperlipidemia, unspecified: Secondary | ICD-10-CM

## 2022-08-16 DIAGNOSIS — I739 Peripheral vascular disease, unspecified: Secondary | ICD-10-CM

## 2022-08-16 DIAGNOSIS — I10 Essential (primary) hypertension: Secondary | ICD-10-CM

## 2022-08-16 DIAGNOSIS — I480 Paroxysmal atrial fibrillation: Secondary | ICD-10-CM

## 2022-08-16 DIAGNOSIS — Z01818 Encounter for other preprocedural examination: Secondary | ICD-10-CM

## 2022-08-16 DIAGNOSIS — H547 Unspecified visual loss: Secondary | ICD-10-CM

## 2022-08-16 DIAGNOSIS — J449 Chronic obstructive pulmonary disease, unspecified: Secondary | ICD-10-CM

## 2022-08-16 DIAGNOSIS — I5032 Chronic diastolic (congestive) heart failure: Secondary | ICD-10-CM | POA: Diagnosis not present

## 2022-08-16 DIAGNOSIS — N183 Chronic kidney disease, stage 3 unspecified: Secondary | ICD-10-CM

## 2022-08-16 DIAGNOSIS — Z720 Tobacco use: Secondary | ICD-10-CM

## 2022-08-16 MED ORDER — METOPROLOL TARTRATE 25 MG PO TABS
25.0000 mg | ORAL_TABLET | Freq: Two times a day (BID) | ORAL | 3 refills | Status: DC
Start: 2022-08-16 — End: 2022-10-05

## 2022-08-16 MED ORDER — AMLODIPINE BESYLATE 10 MG PO TABS
10.0000 mg | ORAL_TABLET | Freq: Every day | ORAL | 3 refills | Status: DC
Start: 2022-08-16 — End: 2022-10-05

## 2022-08-16 MED ORDER — HYDRALAZINE HCL 25 MG PO TABS
25.0000 mg | ORAL_TABLET | Freq: Three times a day (TID) | ORAL | 3 refills | Status: DC
Start: 2022-08-16 — End: 2022-09-15

## 2022-08-16 NOTE — Patient Instructions (Addendum)
Medication Instructions:  STOP THE COREG 6.25MG .  START METOPROLOL TARTRATE 25 TWO TIMES DAILY START HYDRALAZINE THREE TIMES DAILY *If you need a refill on your cardiac medications before your next appointment, please call your pharmacy*   Lab Work: NONE If you have labs (blood work) drawn today and your tests are completely normal, you will receive your results only by: MyChart Message (if you have MyChart) OR A paper copy in the mail If you have any lab test that is abnormal or we need to change your treatment, we will call you to review the results.   Testing/Procedures: [employer] Cardiac Event Monitor Instructions  Your physician has requested you wear your cardiac event monitor for __30___ days [employer] may call or text to confirm a shipping address. The monitor will be sent to a land address via UPS. [employer] will not ship a monitor to a PO BOX. It typically takes 3-5 days to receive your monitor after it has been enrolled. [employer] will assist with USPS tracking if your package is delayed. The telephone number for [employer] is [phone]. Once you have received your monitor, please review the enclosed instructions. Instruction tutorials can also be viewed under help and settings on the enclosed cell phone. Your monitor has already been registered assigning a specific monitor serial # to you.  Billing and Self Pay Discount Information  [employer] has been provided the insurance information we had on file for you.  If your insurance has been updated, please call [employer] at [phone] to provide them with your updated insurance information.   [employer] offers a discounted Self Pay option for patients who have insurance that does not cover their cardiac event monitor or patients without insurance.  The discounted cost of a Self Pay Cardiac Event Monitor would be $225.00 , if the patient contacts [employer] at [phone] within 7 days of applying the monitor to  make payment arrangements.  If the patient does not contact [employer] within 7 days of applying the monitor, the cost of the cardiac event monitor will be $350.00.  Applying the monitor  Remove cell phone from case and turn it on. The cell phone works as IT consultant and needs to be within [employer] of you at all times. The cell phone will need to be charged on a daily basis. We recommend you plug the cell phone into the enclosed charger at your bedside table every night.  Monitor batteries: You will receive two monitor batteries labelled #1 and #2. These are your recorders. Plug battery #2 onto the second connection on the enclosed charger. Keep one battery on the charger at all times. This will keep the monitor battery deactivated. It will also keep it fully charged for when you need to switch your monitor batteries. A small light will be blinking on the battery emblem when it is charging. The light on the battery emblem will remain on when the battery is fully charged.  Open package of a Monitor strip. Insert battery #1 into black hood on strip and gently squeeze monitor battery onto connection as indicated in instruction booklet. Set aside while preparing skin.  Choose location for your strip, vertical or horizontal, as indicated in the instruction booklet. Shave to remove all hair from location. There cannot be any lotions, oils, powders, or colognes on skin where monitor is to be applied. Wipe skin clean with enclosed Saline wipe. Dry skin completely.  Peel paper labeled #1 off the back of the Monitor strip exposing the adhesive. Place the  monitor on the chest in the vertical or horizontal position shown in the instruction booklet. One arrow on the monitor strip must be pointing upward. Carefully remove paper labeled #2, attaching remainder of strip to your skin. Try not to create any folds or wrinkles in the strip as you apply it.  Firmly press and release the circle in the center  of the monitor battery. You will hear a small beep. This is turning the monitor battery on. The heart emblem on the monitor battery will light up every 5 seconds if the monitor battery in turned on and connected to the patient securely. Do not push and hold the circle down as this turns the monitor battery off. The cell phone will locate the monitor battery. A screen will appear on the cell phone checking the connection of your monitor strip. This may read poor connection initially but change to good connection within the next minute. Once your monitor accepts the connection you will hear a series of 3 beeps followed by a climbing crescendo of beeps. A screen will appear on the cell phone showing the two monitor strip placement options. Touch the picture that demonstrates where you applied the monitor strip.  Your monitor strip and battery are waterproof. You are able to shower, bathe, or swim with the monitor on. They just ask you do not submerge deeper than 3 feet underwater. We recommend removing the monitor if you are swimming in a lake, river, or ocean.  Your monitor battery will need to be switched to a fully charged monitor battery approximately once a week. The cell phone will alert you of an action which needs to be made.  On the cell phone, tap for details to reveal connection status, monitor battery status, and cell phone battery status. The green dots indicates your monitor is in good status. A red dot indicates there is something that needs your attention.  To record a symptom, click the circle on the monitor battery. In 30-60 seconds a list of symptoms will appear on the cell phone. Select your symptom and tap save. Your monitor will record a sustained or significant arrhythmia regardless of you clicking the button. Some patients do not feel the heart rhythm irregularities. [employer] will notify us of any serious or critical events.  Refer to instruction booklet for  instructions on switching batteries, changing strips, the Do not disturb or Pause features, or any additional questions.  Call [employer] at [phone], to confirm your monitor is transmitting and record your baseline. They will answer any questions you may have regarding the monitor instructions at that time.  Returning the monitor to [employer]  Place all equipment back into blue box. Peel off strip of paper to expose adhesive and close box securely. There is a prepaid UPS shipping label on this box. Drop in a UPS drop box, or at a UPS facility like [employer]. You may also contact [employer] to arrange UPS to pick up monitor package at your home.   Your physician has requested that you have a carotid duplex. This test is an ultrasound of the carotid arteries in your neck. It looks at blood flow through these arteries that supply the brain with blood. Allow one hour for this exam. There are no restrictions or special instructions.   Your physician has requested that you have an ankle brachial index (ABI). During this test an ultrasound and blood pressure cuff are used to evaluate the arteries that supply the arms and legs with blood. Allow  thirty minutes for this exam. There are no restrictions or special instructions.   Your physician has requested that you have a lower extremity arterial duplex. This test is an ultrasound of the arteries in the legs or arms. It looks at arterial blood flow in the legs and arms. Allow one hour for Lower and Upper Arterial scans. There are no restrictions or special instructions    Follow-Up: At [facility], you and your health needs are our priority.  As part of our continuing mission to provide you with exceptional heart care, we have created designated Provider Care Teams.  These Care Teams include your primary Cardiologist (physician) and Advanced Practice Providers (APPs -  Physician Assistants and Nurse Practitioners) who all work together to  provide you with the care you need, when you need it.  We recommend signing up for the patient portal called "MyChart".  Sign up information is provided on this After Visit Summary.  MyChart is used to connect with patients for Virtual Visits (Telemedicine).  Patients are able to view lab/test results, encounter notes, upcoming appointments, etc.  Non-urgent messages can be sent to your provider as well.   To learn more about what you can do with MyChart, go to [email or web address].    Your next appointment:   FOLLOW UP WITH YOUR PRIMARY CARE PROVIDER   Provider:

## 2022-08-16 NOTE — Telephone Encounter (Signed)
[facility] called stating his provider wanted him to be seen asap. He has an upcoming appt with Dr Janee Morn on Friday 08/18/22 for a hosp f/up.

## 2022-08-16 NOTE — Transitions of Care (Post Inpatient/ED Visit) (Signed)
   08/16/2022  Name: LINZY LADEHOFF MRN: [id number] DOB: 07-29-1955  Today's TOC FU Call Status: Today's TOC FU Call Status:: Unsuccessul Call (1st Attempt) Unsuccessful Call (1st Attempt) Date: 08/16/22 (Pt seems to be having issues with his phone)  Attempted to reach the patient regarding the most recent Inpatient/ED visit.  Follow Up Plan: Additional outreach attempts will be made to reach the patient to complete the Transitions of Care (Post Inpatient/ED visit) call.   Signature Arvil Persons, BSN, Charity fundraiser

## 2022-08-17 ENCOUNTER — Emergency Department (HOSPITAL_COMMUNITY): Payer: Medicaid Other

## 2022-08-17 ENCOUNTER — Emergency Department (HOSPITAL_COMMUNITY)
Admission: EM | Admit: 2022-08-17 | Discharge: 2022-08-17 | Disposition: A | Discharge status: Home / Self Care | Payer: Medicaid Other | Attending: Emergency Medicine | Admitting: Emergency Medicine

## 2022-08-17 ENCOUNTER — Other Ambulatory Visit: Payer: Self-pay

## 2022-08-17 ENCOUNTER — Encounter (HOSPITAL_COMMUNITY): Payer: Self-pay

## 2022-08-17 DIAGNOSIS — I129 Hypertensive chronic kidney disease with stage 1 through stage 4 chronic kidney disease, or unspecified chronic kidney disease: Secondary | ICD-10-CM | POA: Insufficient documentation

## 2022-08-17 DIAGNOSIS — J449 Chronic obstructive pulmonary disease, unspecified: Secondary | ICD-10-CM | POA: Diagnosis not present

## 2022-08-17 DIAGNOSIS — J441 Chronic obstructive pulmonary disease with (acute) exacerbation: Secondary | ICD-10-CM | POA: Diagnosis not present

## 2022-08-17 DIAGNOSIS — Z79899 Other long term (current) drug therapy: Secondary | ICD-10-CM | POA: Insufficient documentation

## 2022-08-17 DIAGNOSIS — Z21 Asymptomatic human immunodeficiency virus [HIV] infection status: Secondary | ICD-10-CM | POA: Insufficient documentation

## 2022-08-17 DIAGNOSIS — N189 Chronic kidney disease, unspecified: Secondary | ICD-10-CM | POA: Diagnosis not present

## 2022-08-17 DIAGNOSIS — Z7982 Long term (current) use of aspirin: Secondary | ICD-10-CM | POA: Insufficient documentation

## 2022-08-17 DIAGNOSIS — Z7951 Long term (current) use of inhaled steroids: Secondary | ICD-10-CM | POA: Insufficient documentation

## 2022-08-17 DIAGNOSIS — R0602 Shortness of breath: Secondary | ICD-10-CM | POA: Diagnosis present

## 2022-08-17 LAB — CBC WITH DIFFERENTIAL/PLATELET
Abs Immature Granulocytes: 0.04 10*3/uL (ref 0.00–0.07)
Basophils Absolute: 0 10*3/uL (ref 0.0–0.1)
Basophils Relative: 0 %
Eosinophils Absolute: 0 10*3/uL (ref 0.0–0.5)
Eosinophils Relative: 0 %
HCT: 33.3 % — ABNORMAL LOW (ref 39.0–52.0)
Hemoglobin: 11.1 g/dL — ABNORMAL LOW (ref 13.0–17.0)
Immature Granulocytes: 0 %
Lymphocytes Relative: 14 %
Lymphs Abs: 1.7 10*3/uL (ref 0.7–4.0)
MCH: 31.6 pg (ref 26.0–34.0)
MCHC: 33.3 g/dL (ref 30.0–36.0)
MCV: 94.9 fL (ref 80.0–100.0)
Monocytes Absolute: 0.6 10*3/uL (ref 0.1–1.0)
Monocytes Relative: 5 %
Neutro Abs: 9.8 10*3/uL — ABNORMAL HIGH (ref 1.7–7.7)
Neutrophils Relative %: 81 %
Platelets: 360 10*3/uL (ref 150–400)
RBC: 3.51 MIL/uL — ABNORMAL LOW (ref 4.22–5.81)
RDW: 14.8 % (ref 11.5–15.5)
WBC: 12.2 10*3/uL — ABNORMAL HIGH (ref 4.0–10.5)
nRBC: 0 % (ref 0.0–0.2)

## 2022-08-17 LAB — BASIC METABOLIC PANEL
Anion gap: 13 (ref 5–15)
BUN: 34 mg/dL — ABNORMAL HIGH (ref 8–23)
CO2: 22 mmol/L (ref 22–32)
Calcium: 8.9 mg/dL (ref 8.9–10.3)
Chloride: 101 mmol/L (ref 98–111)
Creatinine, Ser: 2.66 mg/dL — ABNORMAL HIGH (ref 0.61–1.24)
GFR, Estimated: 26 mL/min — ABNORMAL LOW (ref >60)
Glucose, Bld: 133 mg/dL — ABNORMAL HIGH (ref 70–99)
Potassium: 5.5 mmol/L — ABNORMAL HIGH (ref 3.5–5.1)
Sodium: 136 mmol/L (ref 135–145)

## 2022-08-17 MED ORDER — IPRATROPIUM-ALBUTEROL 0.5-2.5 (3) MG/3ML IN SOLN: 3.0000 mL | Freq: Four times a day (QID) | RESPIRATORY_TRACT | 1 refills | Status: DC | PRN

## 2022-08-17 MED ORDER — AMLODIPINE BESYLATE 5 MG PO TABS
10.0000 mg | ORAL_TABLET | Freq: Once | ORAL | Status: AC
  Administered 2022-08-17: 10 mg via ORAL
  Filled 2022-08-17: qty 2

## 2022-08-17 MED ORDER — PREDNISONE 20 MG PO TABS: 40.0000 mg | ORAL_TABLET | Freq: Every day | ORAL | 0 refills | Status: DC

## 2022-08-17 NOTE — ED Provider Notes (Signed)
EMERGENCY DEPARTMENT AT [facility] Provider Note   CSN: [id number] Arrival date & time: 08/17/22  0720     History  Chief Complaint  Patient presents with   Shortness of Breath    Kevin [name] is a 67 y.o. male.  Pt is a 67 y.o. male with a history of normal coronaries on cardiac catheterization in 10/2016, chronic diastolic CHF, paroxysmal atrial fibrillation not on anticoagulation, PAD s/p atherectomy and angioplasty of mid right SFA in 07/2016, hypertension, CKD, COPD, obstructive sleep apnea on BiPAP and HIV who is presenting today with EMS for complaints of inability to catch his breath when he woke up this morning.  Patient is unfortunately been seen in the emergency room multiple times with similar complaints in the last 6 weeks.  Most recently was 2 days ago.  Patient reports it always occurs the same he is feeling okay when he goes to bed but then wakes up and cannot catch his breath.  He did follow-up with cardiology tomorrow for his routine visit and at that time based on their note was feeling his normal self.  He reports he did not use any breathing treatments yesterday because he did not feel that he needed them.  He has had no chest pain today and the cough seems typical of his baseline.  He has not had fever, emesis and denies any abdominal pain.  When he was at the cardiology office yesterday his blood pressure was elevated in the 160-170 range systolic and his blood pressure medication was changed.  He is now supposed to be on amlodipine 10 mg.  He reports already taking his blood pressure medication yesterday so he was going to start the new dose today.  EMS gave patient 2 DuoNebs and 125 mg of Solu-Medrol he reports feeling better currently.  He does report being under a lot of stress as his daughter had a V. tach/V-fib arrest and was getting a pacemaker yesterday but because he had to go to his cardiology appointment he could not make the visit.  He is  still smoking but reports not as much.  He denies any swelling to his legs does not take diuretics and reports his heart has been good recently.  He states the only thing that is ever a problem is his blood pressure.   Shortness of Breath      Home Medications Prior to Admission medications   Medication Sig Start Date End Date Taking? Authorizing Provider  albuterol (VENTOLIN HFA) 108 (90 Base) MCG/ACT inhaler Inhale 2 puffs into the lungs every 4 (four) hours as needed for wheezing or shortness of breath. 07/25/22  Yes Tilden Fossa, MD  amLODipine (NORVASC) 10 MG tablet Take 1 tablet (10 mg total) by mouth daily. 08/16/22  Yes Corrin Parker, PA-C  aspirin EC 81 MG tablet Take 1 tablet (81 mg total) by mouth daily. Swallow whole. Patient taking differently: Take 162 mg by mouth every 6 (six) hours as needed for mild pain. Swallow whole. 04/28/22  Yes [name], MD  carvedilol (COREG) 6.25 MG tablet Take 6.25 mg by mouth in the morning and at bedtime.   Yes [provider]  dolutegravir-lamiVUDine (DOVATO) 50-300 MG tablet Take 1 tablet by mouth daily. 07/06/22  Yes [name], [name], MD  Fluticasone-Umeclidin-Vilant (TRELEGY ELLIPTA) 100-62.5-25 MCG/ACT AEPB Inhale 1 puff into the lungs daily. 07/26/22  Yes Cobb, Ruby Cola, NP  oxyCODONE-acetaminophen (PERCOCET) 7.5-325 MG tablet Take 1 tablet by  mouth 3 (three) times daily. 06/22/22  Yes [provider]  predniSONE (DELTASONE) 20 MG tablet Take 2 tablets (40 mg total) by mouth daily. Take 40mg  (2 tabs) for 2 days, then take 20mg  (1tab) for 2 days and then take 10mg  (0.5 tab) po for 2 days 08/17/22  Yes Gwyneth Sprout, MD  predniSONE (DELTASONE) 50 MG tablet Take 1 tablet (50 mg total) by mouth daily with breakfast for 4 days. 08/15/22 08/19/22 Yes Laretta Bolster, MD  valsartan (DIOVAN) 320 MG tablet Take 320 mg by mouth daily.   Yes [provider]  atorvastatin (LIPITOR) 40 MG tablet Take 1 tablet (40 mg  total) by mouth daily. Patient not taking: Reported on 08/17/2022 04/28/22   [name], MD  benzonatate (TESSALON) 200 MG capsule Take 1 capsule (200 mg total) by mouth 3 (three) times daily as needed for cough. Patient not taking: Reported on 08/16/2022 07/26/22   [name], NP  dextromethorphan-guaiFENesin (MUCINEX DM) 30-600 MG 12hr tablet Take 1 tablet by mouth 2 (two) times daily. Patient not taking: Reported on 08/17/2022 08/08/22 08/03/23  [name], MD  doxycycline (VIBRAMYCIN) 100 MG capsule Take 1 capsule (100 mg total) by mouth 2 (two) times daily. Patient not taking: Reported on 08/17/2022 07/25/22   Tilden Fossa, MD  guaiFENesin-dextromethorphan [facility] DM) 100-10 MG/5ML syrup Take 5 mLs by mouth every 6 (six) hours as needed for cough. Patient not taking: Reported on 08/17/2022 07/26/22   [name], NP  hydrALAZINE (APRESOLINE) 25 MG tablet Take 1 tablet (25 mg total) by mouth 3 (three) times daily. Patient not taking: Reported on 08/17/2022 08/16/22 11/14/22  Corrin Parker, PA-C  ipratropium-albuterol (DUONEB) 0.5-2.5 (3) MG/3ML SOLN Take 3 mLs by nebulization every 6 (six) hours as needed (wheezing or shortness of breath). 08/17/22   Gwyneth Sprout, MD  metoprolol tartrate (LOPRESSOR) 25 MG tablet Take 1 tablet (25 mg total) by mouth 2 (two) times daily. Patient not taking: Reported on 08/17/2022 08/16/22 11/14/22  Corrin Parker, PA-C  nicotine (NICODERM CQ - DOSED IN MG/24 HOURS) 14 mg/24hr patch Place 1 patch (14 mg total) onto the skin daily. Patient not taking: Reported on 08/16/2022 07/26/22   [name], NP      Allergies    Patient has no known allergies.    Review of Systems   Review of Systems  Respiratory:  Positive for shortness of breath.     Physical Exam Updated Vital Signs BP (!) 203/138   Pulse 81 Comment: Maybree Riling at bedside  Temp 97.7 F (36.5 C) (Oral)   Resp (!) 22 Comment: Kalinda Romaniello at bedside  Ht 6' (1.829 m)   Wt  72.6 kg   SpO2 100% Comment: Yahayra Geis at bedside  BMI 21.70 kg/m  Physical Exam Vitals and nursing note reviewed.  Constitutional:      General: He is not in acute distress.    Appearance: He is well-developed.  HENT:     Head: Normocephalic and atraumatic.  Eyes:     Conjunctiva/sclera: Conjunctivae normal.     Pupils: Pupils are equal, round, and reactive to light.  Cardiovascular:     Rate and Rhythm: Normal rate and regular rhythm.     Heart sounds: No murmur heard. Pulmonary:     Effort: Pulmonary effort is normal. No respiratory distress.     Breath sounds: Wheezing present. No rhonchi or rales.     Comments: Coughing repeatedly on exam but otherwise in no distress.  No accessory muscle use, scant expiratory wheeze in the left upper lobe Abdominal:     General: There is no distension.     Palpations: Abdomen is soft.     Tenderness: There is no abdominal tenderness. There is no guarding or rebound.  Musculoskeletal:        General: No tenderness. Normal range of motion.     Cervical back: Normal range of motion and neck supple.     Right lower leg: No edema.     Left lower leg: No edema.  Skin:    General: Skin is warm and dry.     Findings: No erythema or rash.  Neurological:     Mental Status: He is alert and oriented to person, place, and time. Mental status is at baseline.  Psychiatric:        Behavior: Behavior normal.     ED Results / Procedures / Treatments   Labs (all labs ordered are listed, but only abnormal results are displayed) Labs Reviewed  CBC WITH DIFFERENTIAL/PLATELET - Abnormal; Notable for the following components:      Result Value   WBC 12.2 (*)    RBC 3.51 (*)    Hemoglobin 11.1 (*)    HCT 33.3 (*)    Neutro Abs 9.8 (*)    All other components within normal limits  BASIC METABOLIC PANEL - Abnormal; Notable for the following components:   Potassium 5.5 (*)    Glucose, Bld 133 (*)    BUN 34 (*)    Creatinine, Ser 2.66 (*)    GFR,  Estimated 26 (*)    All other components within normal limits    EKG EKG Interpretation Date/Time:  Thursday August 17 2022 07:27:53 EDT Ventricular Rate:  89 PR Interval:  174 QRS Duration:  82 QT Interval:  357 QTC Calculation: 435 R Axis:   50  Text Interpretation: Sinus rhythm Left ventricular hypertrophy Anterior infarct, old Borderline T abnormalities, inferior leads No significant change since last tracing Confirmed by Gwyneth Sprout ([id number]) on 08/17/2022 8:23:17 AM  Radiology DG Chest Port 1 View  Result Date: 08/17/2022 CLINICAL DATA:  Shortness of breath, hypertension.  Smoker. EXAM: PORTABLE CHEST 1 VIEW COMPARISON:  08/15/2022 FINDINGS: The heart size and mediastinal contours are within normal limits. Both lungs are clear. The visualized skeletal structures are unremarkable. IMPRESSION: No active disease. Electronically Signed   By: Signa Kell M.D.   On: 08/17/2022 08:52    Procedures Procedures    Medications Ordered in ED Medications  amLODipine (NORVASC) tablet 10 mg (10 mg Oral Given 08/17/22 6045)    ED Course/ Medical Decision Making/ A&P                             Medical Decision Making Amount and/or Complexity of Data Reviewed Independent Historian: EMS External Data Reviewed: notes.    Details: cardiology Labs: ordered. Decision-making details documented in ED Course. Radiology: ordered and independent interpretation performed. Decision-making details documented in ED Course. ECG/medicine tests: ordered and independent interpretation performed. Decision-making details documented in ED Course.  Risk Prescription drug management.   Pt with multiple medical problems and comorbidities and presenting today with a complaint that caries a high risk for morbidity and mortality.  Here today with symptoms most classic for flare of his COPD.  Patient has been seen multiple times recently for same symptoms.  He was seen by cardiology yesterday and at that  time  was noted to have persistent elevated blood pressure but otherwise was doing well.  Patient's symptoms today are not as classic for orthopnea/CHF as patient has recently had evaluation and BNP's have been normal and patient does not show signs of fluid overload on exam.  Lower suspicion for PE at this time as patient denies prior history has no unilateral leg pain or swelling, no recent immobilization.  Patient received DuoNebs and Solu-Medrol prior to arrival here by EMS.  He does report improvement in symptoms now.  He is currently in no distress with only a scant wheeze at this time.  Will continue to monitor patient, repeat chest x-ray to ensure no evidence of pneumothorax or other acute findings.  Low suspicion for any pneumonia at this time.  Will repeat EKG and labs and reassess.  9:41 AM I independently interpreted labs and EKG.  EKG with T wave inversions and evidence of LVH which is unchanged from prior, CBC with minimal leukocytosis of 12 most likely from recent steroid use but otherwise unchanged with stable hemoglobin, BMP without acute findings except for mild bump in Cr of 2.66 from normal range of 1.9-2.03. I have independently visualized and interpreted pt's images today.  CXR without acute change today.  Speaking with the patient discussed his test results.  Patient does report that he drink a few beers yesterday but did not have any water to drink.  This is most likely the cause of his bump in his creatinine.  Patient's blood pressure was elevated here and he has received his blood pressure medication.  Initially was in the 160s to 170s and then went up once he started talking more about his history and what is going on.  Will have him continue his current medication but follow-up with his PCP as planned for repeat blood pressure check tomorrow.  Also patient reports he always has such a hard time after just a 5-day burst of prednisone and will do a taper to hopefully help avoid him  returning to the emergency room.  Findings discussed with the patient and his daughter.  This time the patient feels improved and has not required any further intervention.  Feel that he is stable for discharge but was given return precautions.         Final Clinical Impression(s) / ED Diagnoses Final diagnoses:  COPD exacerbation (HCC)    Rx / DC Orders ED Discharge Orders          Ordered    ipratropium-albuterol (DUONEB) 0.5-2.5 (3) MG/3ML SOLN  Every 6 hours PRN        08/17/22 0940    predniSONE (DELTASONE) 20 MG tablet  Daily        08/17/22 0940              Gwyneth Sprout, MD 08/17/22 [phone]

## 2022-08-17 NOTE — ED Notes (Signed)
Patients daughter is very upset that I had told the patient that I would assist in cleaning him up from his incontinence accident from home after we completed his work.  Advised the daughter that the patient has only been in the facility for approximately 20 minutes and in this time, he had been triaged, seen by a provider, lab work collected and sent, an EKG obtained, orders placed and I was currently finishing charting and in route to get things to clean him up.    She said she would clean him herself since I was sitting on my ass, advised we could go to the bathroom to make it easier to clean him, this frustrated her, provided pants, underwear, and/or brief, bags for clothing and smaller bag for other belongings and she was frustrated that I did not provide a towel.    She was complaining the whole time about how terrible the staff was while I was getting medications and waiting to administer, and xray tech at the door also waiting.  Advised we were only trying to complete orders, ensure he was stable, then we were going to clean him up.    Daughter still insisted the tasks completed in the 18 minutes were not sufficient.

## 2022-08-17 NOTE — ED Notes (Signed)
Offered wheel chair twice to patient for discharge, advised the walk was a little long, approximately a football field.  Patient denied both times, daughter upset that I did not provide a wheelchair on their walk out as I was walking them out.

## 2022-08-17 NOTE — Discharge Instructions (Addendum)
All your blood work looks okay today except your kidney function is a little bit higher than it had been at 2.6.  This is most likely because you did not drink any water yesterday and you are most likely a little bit dehydrated.  Make sure you are getting liquids and and plan on following up with your doctor tomorrow and they can keep a close eye on that.  Today you were already given your amlodipine 10 mg for your blood pressure and steroids so you do not need to take those until tomorrow.  A new prescription for your nebulizer was sent to your pharmacy so hopefully you can pick that up.  Also you were given a prescription for a prednisone taper so that when you finish your steroid burst that you have at home right now this will help you come down off of the prednisone a little more gently.  If you have return of your symptoms or feeling worse return to the emergency room.

## 2022-08-17 NOTE — ED Triage Notes (Signed)
PT BIB EMS pt called out for SOB this morning, 22 Duonebs and 125 solu-medrol in route  211/100 HR 86 100% 18 Resp  18 G L AC

## 2022-08-17 NOTE — ED Notes (Signed)
Warm blanket provided in approximately 1 minute from being requested.

## 2022-08-17 NOTE — ED Notes (Signed)
Emptied patient's urinal.

## 2022-08-18 ENCOUNTER — Other Ambulatory Visit: Payer: Self-pay

## 2022-08-18 ENCOUNTER — Telehealth: Payer: Self-pay

## 2022-08-18 ENCOUNTER — Encounter: Payer: Self-pay | Admitting: Family Medicine

## 2022-08-18 ENCOUNTER — Ambulatory Visit: Payer: Medicaid Other | Admitting: Family Medicine

## 2022-08-18 VITALS — BP 166/82 | HR 77 | Temp 97.7°F | Wt 156.2 lb

## 2022-08-18 DIAGNOSIS — I1A0 Resistant hypertension: Secondary | ICD-10-CM | POA: Diagnosis not present

## 2022-08-18 DIAGNOSIS — J441 Chronic obstructive pulmonary disease with (acute) exacerbation: Secondary | ICD-10-CM

## 2022-08-18 DIAGNOSIS — E785 Hyperlipidemia, unspecified: Secondary | ICD-10-CM

## 2022-08-18 DIAGNOSIS — G453 Amaurosis fugax: Secondary | ICD-10-CM | POA: Insufficient documentation

## 2022-08-18 DIAGNOSIS — I739 Peripheral vascular disease, unspecified: Secondary | ICD-10-CM

## 2022-08-18 DIAGNOSIS — I5032 Chronic diastolic (congestive) heart failure: Secondary | ICD-10-CM

## 2022-08-18 HISTORY — DX: Amaurosis fugax: G45.3

## 2022-08-18 MED ORDER — ACETYLCYSTEINE 10 % IN SOLN
4.0000 mL | RESPIRATORY_TRACT | 12 refills | Status: DC
Start: 2022-08-18 — End: 2022-09-19

## 2022-08-18 NOTE — Assessment & Plan Note (Addendum)
Possible TIA/Amaurosis Fugax: Patient reports transient vision changes in left eye. Cardiology recommends neurology and ophthalmology evaluation. -Refer to neurology for evaluation. -Refer to ophthalmology for evaluation.

## 2022-08-18 NOTE — Assessment & Plan Note (Addendum)
Hypertension: Blood pressure remains elevated despite multiple medications. Patient is on amlodipine 10mg , hydralazine 25mg  TID (not yet started), metoprolol 25mg  BID, and valsartan 320mg . -Continue current medications. -Start hydralazine 25mg  TID. -Discontinue carvedilol 6.25mg  due to duplicate beta-blocker therapy

## 2022-08-18 NOTE — Telephone Encounter (Signed)
-----   Message from Corrin Parker, [place] sent at 08/16/2022  8:01 PM EDT ----- Kevin [name],  I saw this patient in clinic today. He is a really complicated guy and had a lot going on. I did not communicate well to my covering staff (completely my fault) - we got him a close follow-up visit with his PCP later this week but I don't think I mentioned that he also needs a follow-up visit with [name] in 3-4 weeks for follow-up of his hypertension. It can be with [facility] or any APP (does not necessarily have to be with me). Can you please call him to schedule this visit?  Also after his visit, I realized he was no longer taking his Aspirin 81mg  or Lipitor 40mg  daily. He reportedly said he never got them from the Pharmacy. When you call him to schedule his follow-up visit, can you also advise him to go and pick these up and start them. He will then need a repeat fasting lipid panel and LFTs in 2-3 months.  Thank you so so much! Callie

## 2022-08-18 NOTE — Telephone Encounter (Signed)
Pt notified he states that he did start the atorvastatin and I also spoke with daughter per pt direction, she will get the aspirin 81mg  to take. He will also return for f/u lab in 2-3 months. Orders entered

## 2022-08-18 NOTE — Assessment & Plan Note (Signed)
COPD Exacerbation: Recent exacerbations requiring ED visits and steroids. Cardiology recommends optimizing COPD management prior to colonoscopy. Patient reports morning congestion and difficulty breathing upon waking. -Continue Trelegy as prescribed. -Add nebulized acetylcysteine 10% solution every 4 hours as needed to help break up mucus. -follow-up with pulmonologist

## 2022-08-18 NOTE — Progress Notes (Signed)
Assessment/Plan:   Problem List Items Addressed This Visit       Cardiovascular and Mediastinum   Resistant hypertension    Hypertension: Blood pressure remains elevated despite multiple medications. Patient is on amlodipine 10mg , hydralazine 25mg  TID (not yet started), metoprolol 25mg  BID, and valsartan 320mg . -Continue current medications. -Start hydralazine 25mg  TID. -Discontinue carvedilol 6.25mg  due to duplicate beta-blocker therapy       Amaurosis fugax    Possible TIA/Amaurosis Fugax: Patient reports transient vision changes in left eye. Cardiology recommends neurology and ophthalmology evaluation. -Refer to neurology for evaluation. -Refer to ophthalmology for evaluation.      Relevant Orders   Ambulatory referral to Ophthalmology   Ambulatory referral to Neurology     Respiratory   COPD exacerbation (HCC) - Primary    COPD Exacerbation: Recent exacerbations requiring ED visits and steroids. Cardiology recommends optimizing COPD management prior to colonoscopy. Patient reports morning congestion and difficulty breathing upon waking. -Continue Trelegy as prescribed. -Add nebulized acetylcysteine 10% solution every 4 hours as needed to help break up mucus. -follow-up with pulmonologist       Relevant Medications   acetylcysteine (MUCOMYST) 10 % nebulizer solution    Medications Discontinued During This Encounter  Medication Reason   carvedilol (COREG) 6.25 MG tablet     Return in about 4 weeks (around 09/15/2022) for BP.    Subjective:   Encounter date: 08/18/2022  Kevin [name] is a 67 y.o. male who has Shortness of breath; PAF (paroxysmal atrial fibrillation) (HCC); Tobacco abuse; Acute on chronic renal insufficiency; COPD exacerbation (HCC); Hypertensive emergency; Blurry vision, bilateral; Hypertensive cardiovascular disease; Essential hypertension; Acute on chronic diastolic CHF (congestive heart failure), NYHA class 3 (HCC); OSA (obstructive sleep  apnea); Peripheral arterial disease (HCC); Stage 3 chronic kidney disease (HCC); Claudication in peripheral vascular disease (HCC); HIV disease (HCC); Easy bruising; Coronary artery disease involving native coronary artery of native heart with unstable angina pectoris (HCC); Hypertensive urgency; Callus of foot; Dyspnea; Hypertensive crisis; Acute respiratory failure with hypoxia (HCC); Papillary renal cell carcinoma (HCC); Right renal mass; Renal lesion; Acute diastolic heart failure (HCC); Chronic heart failure with preserved ejection fraction (HFpEF) (HCC); HLD (hyperlipidemia); Chest pain; Elevated troponin; Chronic low back pain; Lipoma; Lung nodule, multiple; Bloody stool; Resistant hypertension; Skin nodule; and Amaurosis fugax on their problem list..   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 Trouble breathing when sleeping. Discuss anxiety. ) .   Discussed the use of AI scribe software for clinical note transcription with the patient, who gave verbal consent to proceed.  History of Present Illness   The patient, with a history of COPD, hypertension, and kidney disease, presents for follow-up after a recent COPD exacerbation. He reports having been seen by cardiology recently, who recommended optimization of COPD medication before undergoing a scheduled colonoscopy. The patient has seen a pulmonologist  once but has not had a follow-up visit yet. He has been experiencing frequent COPD exacerbations, with two episodes in the past couple of weeks requiring emergency department visits.  The patient reports that his breathing issues are most severe in the morning upon waking, when he experiences a build-up of mucus in his throat. This often leads to panic and difficulty breathing. On one occasion, the patient lost control of his bowels during a breathing episode. He has been using a nebulizer three times a day, as recommended by his pulmonologist, and has been taking prednisone, which he reports increases his energy levels but does not seem to be improving his overall condition.  In addition to his respiratory issues, the patient has been experiencing vision changes, specifically episodes of temporary blindness in his left eye. He has not yet been able to see an ophthalmologist for this issue. The patient reports dealing with significant family stress, as his 50 year old daughter recently had a heart attack and is currently hospitalized.  The patient is on multiple medications for his blood pressure, including amlodipine, hydralazine, metoprolol, and valsartan. He also takes aspirin daily. Despite these medications, his blood pressure remains high.       Review of Systems  Constitutional:  Negative for chills, diaphoresis, fever, malaise/fatigue and weight loss.  HENT:  Negative for congestion, ear discharge, ear pain and hearing loss.   Eyes:  Positive for blurred vision. Negative for double vision, photophobia, pain, discharge and redness.  Respiratory:  Positive for cough and shortness of breath. Negative for sputum production and wheezing.   Cardiovascular:  Negative for chest pain and palpitations.  Gastrointestinal:  Negative for abdominal pain, blood in stool, constipation, diarrhea, heartburn, melena, nausea and vomiting.  Genitourinary:  Negative for dysuria, flank pain, frequency, hematuria  and urgency.  Musculoskeletal:  Negative for myalgias.  Skin:  Negative for rash.  Neurological:  Negative for dizziness, tingling, tremors, speech change, seizures, loss of consciousness, weakness and headaches.  Psychiatric/Behavioral:  Negative for depression, hallucinations, memory loss, substance abuse and suicidal ideas. The patient is nervous/anxious. The patient does not have insomnia.   All other systems reviewed and are negative.   Past Surgical History:  Procedure Laterality Date   IR RADIOLOGIST EVAL & MGMT  05/31/2021   IR RADIOLOGIST EVAL & MGMT  07/26/2021   LEFT HEART CATH AND CORONARY ANGIOGRAPHY N/A 10/23/2016   Procedure: LEFT HEART CATH AND CORONARY ANGIOGRAPHY;  Surgeon: Marykay Lex, MD;  Location: [facility] INVASIVE CV LAB;  Service: Cardiovascular;  Laterality: N/A;   LOWER EXTREMITY ANGIOGRAPHY N/A 07/17/2016   Procedure: Lower Extremity Angiography;  Surgeon: Runell Gess, MD;  Location: [facility] INVASIVE CV LAB;  Service: Cardiovascular;  Laterality: N/A;   LOWER EXTREMITY INTERVENTION N/A 06/05/2016   Procedure: Lower Extremity Intervention;  Surgeon: Runell Gess, MD;  Location: [facility] INVASIVE CV LAB;  Service: Cardiovascular;  Laterality: N/A;   PERIPHERAL VASCULAR ATHERECTOMY Right 07/17/2016   Procedure: Peripheral Vascular Atherectomy;  Surgeon: Runell Gess, MD;  Location: [facility] INVASIVE CV LAB;  Service: Cardiovascular;  Laterality: Right;  SFA   PERIPHERAL VASCULAR INTERVENTION  06/05/2016   Procedure: Peripheral Vascular Intervention;  Surgeon: Runell Gess, MD;  Location: [facility] INVASIVE CV LAB;  Service: Cardiovascular;;  left SFA   RADIOLOGY WITH ANESTHESIA Right 06/29/2021   Procedure: RIGHT RENAL CRYOABLATION;  Surgeon: Richarda Overlie, MD;  Location: [facility] ORS;  Service: Anesthesiology;  Laterality: Right;    Outpatient Medications Prior to Visit  Medication Sig Dispense Refill   albuterol (VENTOLIN HFA) 108 (90 Base) MCG/ACT inhaler Inhale 2 puffs into the lungs  every 4 (four) hours as needed for wheezing or shortness of breath.  54 g 1   amLODipine (NORVASC) 10 MG tablet Take 1 tablet (10 mg total) by mouth daily. 90 tablet 3   aspirin EC 81 MG tablet Take 1 tablet (81 mg total) by mouth daily. Swallow whole. (Patient taking differently: Take 162 mg by mouth every 6 (six) hours as needed for mild pain. Swallow whole.) 30 tablet 12   dolutegravir-lamiVUDine (DOVATO) 50-300 MG tablet Take 1 tablet by mouth daily. 30 tablet 11   Fluticasone-Umeclidin-Vilant (TRELEGY ELLIPTA) 100-62.5-25 MCG/ACT AEPB Inhale 1 puff into the lungs daily. 60 each 5   ipratropium-albuterol (DUONEB) 0.5-2.5 (3) MG/3ML SOLN Take 3 mLs by nebulization every 6 (six) hours as needed (wheezing or shortness of breath). 120 mL 1   oxyCODONE-acetaminophen (PERCOCET) 7.5-325 MG tablet Take 1 tablet by mouth 3 (three) times daily.     predniSONE (DELTASONE) 20 MG tablet Take 2 tablets (40 mg total) by mouth daily. Take 40mg  (2 tabs) for 2 days, then take 20mg  (1tab) for 2 days and then take 10mg  (0.5 tab) po for 2 days 7 tablet 0   predniSONE (DELTASONE) 50 MG tablet Take 1 tablet (50 mg total) by mouth daily with breakfast for 4 days. 4 tablet 0   valsartan (DIOVAN) 320 MG tablet Take 320 mg by mouth daily.     carvedilol (COREG) 6.25 MG tablet Take 6.25 mg by mouth in the morning and at bedtime.     atorvastatin (LIPITOR) 40 MG tablet Take 1 tablet (40 mg total) by mouth daily. (Patient not taking: Reported on 08/17/2022) 60 tablet 3   benzonatate (TESSALON) 200 MG capsule Take 1 capsule (200 mg total) by mouth 3 (three) times daily as needed for cough. (Patient not taking: Reported on 08/16/2022) 30 capsule 1   dextromethorphan-guaiFENesin (MUCINEX DM) 30-600 MG 12hr tablet Take 1 tablet by mouth 2 (two) times daily. (Patient not taking: Reported on 08/17/2022) 180 tablet 3   doxycycline (VIBRAMYCIN) 100 MG capsule Take 1 capsule (100 mg total) by mouth 2 (two) times daily. (Patient not taking:  Reported on 08/17/2022) 10 capsule 0   guaiFENesin-dextromethorphan (ROBITUSSIN DM) 100-10 MG/5ML syrup Take 5 mLs by mouth every 6 (six) hours as needed for cough. (Patient not taking: Reported on 08/17/2022) 118 mL 0   hydrALAZINE (APRESOLINE) 25 MG tablet Take 1 tablet (25 mg total) by mouth 3 (three) times daily. (Patient not taking: Reported on 08/17/2022) 270 tablet 3   metoprolol tartrate (LOPRESSOR) 25 MG tablet Take 1 tablet (25 mg total) by mouth 2 (two) times daily. (Patient not taking: Reported on 08/17/2022) 180 tablet 3   nicotine (NICODERM CQ - DOSED IN MG/24 HOURS) 14 mg/24hr patch Place 1 patch (14 mg total) onto the skin daily. (Patient not taking: Reported on 08/16/2022) 28 patch 0   No facility-administered medications prior to visit.    Family History  Problem Relation Age of Onset   High blood pressure Mother    Lupus Mother     Social History   Socioeconomic History   Marital status: Significant Other    Spouse name: Not on file   Number of children: Not on file   Years of education: Not on file   Highest education level: Not on file  Occupational History   Not on file  Tobacco Use   Smoking status: Every Day    Packs/day: 0.10    Years: 42.00    Additional pack years: 0.00    Total pack years: 4.20    Types: Cigarettes, E-cigarettes  Passive exposure: Never   Smokeless tobacco: Never   Tobacco comments:    1-2 cigarettes a day; vaping  Vaping Use   Vaping Use: Every day   Substances: Nicotine, Flavoring  Substance and Sexual Activity   Alcohol use: Not Currently    Alcohol/week: 2.0 standard drinks of alcohol    Types: 2 Cans of beer per week    Comment: rare   Drug use: Not Currently    Comment: rare   Sexual activity: Not Currently    Comment: declined condoms  Other Topics Concern   Not on file  Social History Narrative   Not on file   Social Determinants of Health   Financial Resource Strain: Not on file  Food Insecurity: No Food Insecurity  (08/07/2022)   Hunger Vital Sign    Worried About Running Out of Food in the Last Year: Never true    Ran Out of Food in the Last Year: Never true  Transportation Needs: Unmet Transportation Needs (07/04/2022)   PRAPARE - Administrator, Civil Service (Medical): Yes    Lack of Transportation (Non-Medical): No  Physical Activity: Not on file  Stress: Stress Concern Present (05/04/2022)   [employer] of Occupational Health - Occupational Stress Questionnaire    Feeling of Stress : To some extent  Social Connections: Not on file  Intimate Partner Violence: Not At Risk (12/27/2021)   Humiliation, Afraid, Rape, and Kick questionnaire    Fear of Current or Ex-Partner: No    Emotionally Abused: No    Physically Abused: No    Sexually Abused: No                                                                                                  Objective:  Physical Exam: BP (!) 166/82 (BP Location: Left Arm, Patient Position: Sitting, Cuff Size: Large)   Pulse 77   Temp 97.7 F (36.5 C) (Temporal)   Wt 156 lb 3.2 oz (70.9 kg)   SpO2 96%   BMI 21.18 kg/m   BP Readings from Last 3 Encounters:  08/18/22 (!) 166/82  08/17/22 (!) 171/116  08/16/22 (!) 162/90     Physical Exam Constitutional:      Appearance: Normal appearance.  HENT:     Head: Normocephalic and atraumatic.     Right Ear: Hearing normal.     Left Ear: Hearing normal.     Nose: Nose normal.  Eyes:     General: No scleral icterus.       Right eye: No discharge.        Left eye: No discharge.     Extraocular Movements: Extraocular movements intact.  Cardiovascular:     Rate and Rhythm: Normal rate and regular rhythm.     Heart sounds: Normal heart sounds.  Pulmonary:     Effort: Pulmonary effort is normal.     Breath sounds: Normal breath sounds.  Abdominal:     Palpations: Abdomen is soft.     Tenderness: There is no abdominal tenderness.  Skin:    General: Skin is warm.  Neurological:      General: No focal deficit present.     Mental Status: He is alert.     Cranial Nerves: No cranial nerve deficit.  Psychiatric:        Mood and Affect: Mood normal.        Behavior: Behavior normal.        Thought Content: Thought content normal.        Judgment: Judgment normal.     DG Chest Port 1 View  Result Date: 08/17/2022 CLINICAL DATA:  Shortness of breath, hypertension.  Smoker. EXAM: PORTABLE CHEST 1 VIEW COMPARISON:  08/15/2022 FINDINGS: The heart size and mediastinal contours are within normal limits. Both lungs are clear. The visualized skeletal structures are unremarkable. IMPRESSION: No active disease. Electronically Signed   By: Signa Kell M.D.   On: 08/17/2022 08:52   DG Chest 2 View  Result Date: 08/15/2022 CLINICAL DATA:  COPD. EXAM: CHEST - 2 VIEW COMPARISON:  07/25/2022 FINDINGS: Heart size and mediastinal contours are normal. Mild central airway thickening with peribronchial cuffing. No pleural fluid, interstitial edema or airspace disease. Visualized osseous structures are unremarkable. IMPRESSION: Mild central airway thickening with peribronchial cuffing. No focal airspace disease. Electronically Signed   By: Signa Kell M.D.   On: 08/15/2022 07:36   DG Chest Portable 1 View  Result Date: 07/25/2022 CLINICAL DATA:  Shortness of breath. EXAM: PORTABLE CHEST 1 VIEW COMPARISON:  July 19, 2022 FINDINGS: The heart size and mediastinal contours are within normal limits. Very mild bilateral infrahilar atelectatic changes are seen. There is no evidence of acute infiltrate, pleural effusion or pneumothorax. The visualized skeletal structures are unremarkable. IMPRESSION: Very mild bilateral infrahilar atelectasis. Electronically Signed   By: Aram Candela M.D.   On: 07/25/2022 00:34   DG Chest 2 View  Result Date: 07/19/2022 CLINICAL DATA:  Chest pain, shortness of breath. EXAM: CHEST - 2 VIEW COMPARISON:  Jul 14, 2022. FINDINGS: The heart size and mediastinal contours  are within normal limits. Both lungs are clear. The visualized skeletal structures are unremarkable. IMPRESSION: No active cardiopulmonary disease. Electronically Signed   By: Lupita Raider M.D.   On: 07/19/2022 16:16   DG Chest Portable 1 View  Result Date: 07/14/2022 CLINICAL DATA:  Chest pain with shortness of breath EXAM: PORTABLE CHEST 1 VIEW COMPARISON:  Chest x-ray 07/08/2022 FINDINGS: The heart size and mediastinal contours are within normal limits. Both lungs are clear. The visualized skeletal structures are unremarkable. IMPRESSION: No active disease. Electronically Signed   By: Darliss Cheney M.D.   On: 07/14/2022 21:45   DG Chest 2 View  Result Date: 07/08/2022 CLINICAL DATA:  Shortness of breath and cough. EXAM: CHEST - 2 VIEW COMPARISON:  06/01/2022 FINDINGS: Heart size and mediastinal contours are unremarkable. No pleural fluid or edema. Central airway thickening. Lungs appear hyperinflated. No airspace disease. Visualized osseous structures appear intact. IMPRESSION: Central airway thickening compatible with bronchitis or reactive airways disease. Electronically Signed   By: Signa Kell M.D.   On: 07/08/2022 14:12   DG Chest Port 1 View  Result Date: 06/01/2022 CLINICAL DATA:  Abnormal EKG EXAM: PORTABLE CHEST 1 VIEW COMPARISON:  Chest x-ray 05/08/2022 FINDINGS: No consolidation, pneumothorax or effusion. Normal cardiopericardial silhouette without edema. Overlapping cardiac leads. The extreme inferior costophrenic angles are clipped off the edge of the film. IMPRESSION: No acute cardiopulmonary disease Electronically Signed   By: Karen Kays M.D.   On: 06/01/2022 13:47    Recent Results (from the  past 2160 hour(s))  CBC with Differential/Platelet     Status: Abnormal   Collection Time: 06/01/22 12:28 PM  Result Value Ref Range   WBC 7.1 4.0 - 10.5 K/uL   RBC 3.36 (L) 4.22 - 5.81 MIL/uL   Hemoglobin 10.7 (L) 13.0 - 17.0 g/dL   HCT 10.2 (L) 72.5 - 36.6 %   MCV 94.0 80.0 -  100.0 fL   MCH 31.8 26.0 - 34.0 pg   MCHC 33.9 30.0 - 36.0 g/dL   RDW 44.0 34.7 - 42.5 %   Platelets 301 150 - 400 K/uL   nRBC 0.0 0.0 - 0.2 %   Neutrophils Relative % 55 %   Neutro Abs 3.9 1.7 - 7.7 K/uL   Lymphocytes Relative 33 %   Lymphs Abs 2.4 0.7 - 4.0 K/uL   Monocytes Relative 7 %   Monocytes Absolute 0.5 0.1 - 1.0 K/uL   Eosinophils Relative 4 %   Eosinophils Absolute 0.3 0.0 - 0.5 K/uL   Basophils Relative 1 %   Basophils Absolute 0.0 0.0 - 0.1 K/uL   Immature Granulocytes 0 %   Abs Immature Granulocytes 0.01 0.00 - 0.07 K/uL    Comment: Performed at [facility], [address] [place], [place] [address]  Basic metabolic panel     Status: Abnormal   Collection Time: 06/01/22 12:28 PM  Result Value Ref Range   Sodium 136 135 - 145 mmol/L   Potassium 3.9 3.5 - 5.1 mmol/L   Chloride 110 98 - 111 mmol/L   CO2 22 22 - 32 mmol/L   Glucose, Bld 93 70 - 99 mg/dL    Comment: Glucose reference range applies only to samples taken after fasting for at least 8 hours.   BUN 20 8 - 23 mg/dL   Creatinine, Ser 7.56 (H) 0.61 - 1.24 mg/dL   Calcium 8.5 (L) 8.9 - 10.3 mg/dL   GFR, Estimated 37 (L) >60 mL/min    Comment: (NOTE) Calculated using the CKD-EPI Creatinine Equation (2021)    Anion gap 4 (L) 5 - 15    Comment: Performed at [facility], [address], [place], [place] [address]  Troponin I (High Sensitivity)     Status: Abnormal   Collection Time: 06/01/22 12:28 PM  Result Value Ref Range   Troponin I (High Sensitivity) 39 (H) <18 ng/L    Comment: (NOTE) Elevated high sensitivity troponin I (hsTnI) values and significant  changes across serial measurements may suggest ACS but many other  chronic and acute conditions are known to elevate hsTnI results.  Refer to the "Links" section for chest pain algorithms and additional  guidance. Performed at [facility], [address], [place], [place] [address]    T-helper cells (CD4) count (not at [facility])     Status: None   Collection Time: 07/06/22 11:29 AM  Result Value Ref Range   CD4 T Cell Abs 842 400 - 1,790 /uL   CD4 % Helper T Cell 52 33 - 65 %    Comment: Performed at [facility], [address], [place], [place] [address]  HIV-1 RNA quant-no reflex-bld     Status: None   Collection Time: 07/06/22 11:45 AM  Result Value Ref Range   HIV 1 RNA Quant Not Detected Copies/mL   HIV-1 RNA Quant, Log Not Detected Log cps/mL    Comment: . Reference Range:  Not Detected     copies/mL                           Not Detected Log copies/mL . Marland Kitchen The test was performed using Real-Time Polymerase Chain Reaction. . . Reportable Range: 20 copies/mL to 10,000,000 copies/mL (1.30 Log copies/mL to 7.00 Log copies/mL). .   COMPLETE METABOLIC PANEL WITH GFR     Status: Abnormal   Collection Time: 07/06/22 11:45 AM  Result Value Ref Range   Glucose, Bld 60 (L) 65 - 99 mg/dL    Comment: .            Fasting reference interval .    BUN 17 7 - 25 mg/dL   Creat 6.04 (H) 5.40 - 1.35 mg/dL   eGFR 36 (L) > OR = 60 mL/min/1.60m2   BUN/Creatinine Ratio 8 6 - 22 (calc)   Sodium 142 135 - 146 mmol/L   Potassium 4.0 3.5 - 5.3 mmol/L   Chloride 111 (H) 98 - 110 mmol/L    Comment: Verified by repeat analysis. .    CO2 24 20 - 32 mmol/L   Calcium 8.5 (L) 8.6 - 10.3 mg/dL   Total Protein 7.2 6.1 - 8.1 g/dL   Albumin 4.2 3.6 - 5.1 g/dL   Globulin 3.0 1.9 - 3.7 g/dL (calc)   AG Ratio 1.4 1.0 - 2.5 (calc)   Total Bilirubin 0.5 0.2 - 1.2 mg/dL   Alkaline phosphatase (APISO) 75 35 - 144 U/L   AST 12 10 - 35 U/L   ALT 4 (L) 9 - 46 U/L  CBC with Differential/Platelet     Status: Abnormal   Collection Time: 07/06/22 11:45 AM  Result Value Ref Range   WBC 7.3 3.8 - 10.8 Thousand/uL   RBC 3.79 (L) 4.20 - 5.80 Million/uL   Hemoglobin 11.9 (L) 13.2 - 17.1 g/dL   HCT 98.1 (L) 19.1 - 47.8 %   MCV 96.3 80.0 - 100.0 fL    MCH 31.4 27.0 - 33.0 pg   MCHC 32.6 32.0 - 36.0 g/dL   RDW 29.5 62.1 - 30.8 %   Platelets 319 140 - 400 Thousand/uL   MPV 9.9 7.5 - 12.5 fL   Neutro Abs 4,482 1,500 - 7,800 cells/uL   Lymphs Abs 1,840 850 - 3,900 cells/uL   Absolute Monocytes 387 200 - 950 cells/uL   Eosinophils Absolute 533 (H) 15 - 500 cells/uL   Basophils Absolute 58 0 - 200 cells/uL   Neutrophils Relative % 61.4 %   Total Lymphocyte 25.2 %   Monocytes Relative 5.3 %   Eosinophils Relative 7.3 %   Basophils Relative 0.8 %  RPR     Status: None   Collection Time: 07/06/22 11:45 AM  Result Value Ref Range   RPR Ser Ql NON-REACTIVE NON-REACTIVE    Comment: . No laboratory evidence of syphilis. If recent exposure is suspected, submit a new sample in 2-4 weeks. .   Lipid panel     Status: Abnormal   Collection Time: 07/06/22 11:45 AM  Result Value Ref Range   Cholesterol 184 <200 mg/dL   HDL 52 > OR = 40 mg/dL   Triglycerides 657 <846 mg/dL   LDL Cholesterol (Calc) 110 (H) mg/dL (calc)    Comment: Reference range: <100 . Desirable range <100 mg/dL for primary prevention;   <70 mg/dL for patients with CHD or diabetic patients  with > or = 2 CHD risk factors. Marland Kitchen  LDL-C is now calculated using the Martin-Hopkins  calculation, which is a validated novel method providing  better accuracy than the Friedewald equation in the  estimation of LDL-C.  Horald Pollen et al. Lenox Ahr. 8469;629(52): 2061-2068  (http://education.[facility].com/faq/FAQ164)    Total CHOL/HDL Ratio 3.5 <5.0 (calc)   Non-HDL Cholesterol (Calc) 132 (H) <130 mg/dL (calc)    Comment: For patients with diabetes plus 1 major ASCVD risk  factor, treating to a non-HDL-C goal of <100 mg/dL  (LDL-C of <84 mg/dL) is considered a therapeutic  option.   CBC     Status: Abnormal   Collection Time: 07/14/22  9:29 PM  Result Value Ref Range   WBC 11.8 (H) 4.0 - 10.5 K/uL   RBC 3.53 (L) 4.22 - 5.81 MIL/uL   Hemoglobin 11.3 (L) 13.0 - 17.0 g/dL    HCT 13.2 (L) 44.0 - 52.0 %   MCV 95.8 80.0 - 100.0 fL   MCH 32.0 26.0 - 34.0 pg   MCHC 33.4 30.0 - 36.0 g/dL   RDW 10.2 72.5 - 36.6 %   Platelets 298 150 - 400 K/uL   nRBC 0.0 0.0 - 0.2 %    Comment: Performed at [facility], [address], [place], [place] [address]  Troponin I (High Sensitivity)     Status: Abnormal   Collection Time: 07/14/22  9:29 PM  Result Value Ref Range   Troponin I (High Sensitivity) 33 (H) <18 ng/L    Comment: (NOTE) Elevated high sensitivity troponin I (hsTnI) values and significant  changes across serial measurements may suggest ACS but many other  chronic and acute conditions are known to elevate hsTnI results.  Refer to the "Links" section for chest pain algorithms and additional  guidance. Performed at [facility], [address], [place], [place] [address]   Brain natriuretic peptide     Status: None   Collection Time: 07/14/22  9:29 PM  Result Value Ref Range   B Natriuretic Peptide 43.4 0.0 - 100.0 pg/mL    Comment: Performed at [facility], [address], [place], [place] [address]  Troponin I (High Sensitivity)     Status: Abnormal   Collection Time: 07/14/22 11:26 PM  Result Value Ref Range   Troponin I (High Sensitivity) 42 (H) <18 ng/L    Comment: (NOTE) Elevated high sensitivity troponin I (hsTnI) values and significant  changes across serial measurements may suggest ACS but many other  chronic and acute conditions are known to elevate hsTnI results.  Refer to the "Links" section for chest pain algorithms and additional  guidance. Performed at [facility], [address], [place], [place] [address]   Basic metabolic panel     Status: Abnormal   Collection Time: 07/14/22 11:26 PM  Result Value Ref Range   Sodium 138 135 - 145 mmol/L   Potassium 3.7 3.5 - 5.1 mmol/L   Chloride 109 98 - 111 mmol/L   CO2 20 (L) 22 - 32 mmol/L   Glucose, Bld 102 (H) 70 - 99 mg/dL    Comment: Glucose reference range  applies only to samples taken after fasting for at least 8 hours.   BUN 23 8 - 23 mg/dL   Creatinine, Ser 2.95 (H) 0.61 - 1.24 mg/dL   Calcium 8.5 (L) 8.9 - 10.3 mg/dL   GFR, Estimated 36 (L) >60 mL/min    Comment: (NOTE) Calculated using the CKD-EPI Creatinine Equation (2021)    Anion gap 9 5 -  15    Comment: Performed at [facility], [address], [place], [place] [address]  Basic metabolic panel     Status: Abnormal   Collection Time: 07/19/22  3:45 PM  Result Value Ref Range   Sodium 136 135 - 145 mmol/L   Potassium 3.5 3.5 - 5.1 mmol/L   Chloride 106 98 - 111 mmol/L   CO2 23 22 - 32 mmol/L   Glucose, Bld 107 (H) 70 - 99 mg/dL    Comment: Glucose reference range applies only to samples taken after fasting for at least 8 hours.   BUN 14 8 - 23 mg/dL   Creatinine, Ser 6.04 (H) 0.61 - 1.24 mg/dL   Calcium 8.4 (L) 8.9 - 10.3 mg/dL   GFR, Estimated 42 (L) >60 mL/min    Comment: (NOTE) Calculated using the CKD-EPI Creatinine Equation (2021)    Anion gap 7 5 - 15    Comment: Performed at [facility], [address] [place], [place] [address]  CBC     Status: Abnormal   Collection Time: 07/19/22  3:45 PM  Result Value Ref Range   WBC 9.8 4.0 - 10.5 K/uL   RBC 3.67 (L) 4.22 - 5.81 MIL/uL   Hemoglobin 11.5 (L) 13.0 - 17.0 g/dL   HCT 11.9 (L) 14.7 - 82.9 %   MCV 94.3 80.0 - 100.0 fL   MCH 31.3 26.0 - 34.0 pg   MCHC 33.2 30.0 - 36.0 g/dL   RDW 56.2 13.0 - 86.5 %   Platelets 298 150 - 400 K/uL   nRBC 0.0 0.0 - 0.2 %    Comment: Performed at [facility], [address], [place], [place] [address]  Troponin I (High Sensitivity)     Status: Abnormal   Collection Time: 07/19/22  3:45 PM  Result Value Ref Range   Troponin I (High Sensitivity) 37 (H) <18 ng/L    Comment: (NOTE) Elevated high sensitivity troponin I (hsTnI) values and significant  changes across serial measurements may suggest ACS but many other  chronic and acute conditions are known to  elevate hsTnI results.  Refer to the "Links" section for chest pain algorithms and additional  guidance. Performed at [facility], [address] [place], [place] [address]   Type and screen [facility]     Status: None   Collection Time: 07/19/22  3:45 PM  Result Value Ref Range   ABO/RH(D) O POS    Antibody Screen NEG    Sample Expiration      07/22/2022,2359 Performed at [facility], [address] [place], [place] [address]   Troponin I (High Sensitivity)     Status: Abnormal   Collection Time: 07/19/22  8:07 PM  Result Value Ref Range   Troponin I (High Sensitivity) 36 (H) <18 ng/L    Comment: (NOTE) Elevated high sensitivity troponin I (hsTnI) values and significant  changes across serial measurements may suggest ACS but many other  chronic and acute conditions are known to elevate hsTnI results.  Refer to the "Links" section for chest pain algorithms and additional  guidance. Performed at [facility], [address] [place], [place] [address]   POC occult blood, ED     Status: Abnormal   Collection Time: 07/19/22  9:19 PM  Result Value Ref Range   Fecal Occult Bld POSITIVE (A) NEGATIVE  Comprehensive metabolic panel     Status: Abnormal  Collection Time: 07/25/22 12:17 AM  Result Value Ref Range   Sodium 137 135 - 145 mmol/L   Potassium 4.3 3.5 - 5.1 mmol/L    Comment: HEMOLYSIS AT THIS LEVEL MAY AFFECT RESULT   Chloride 104 98 - 111 mmol/L   CO2 24 22 - 32 mmol/L   Glucose, Bld 121 (H) 70 - 99 mg/dL    Comment: Glucose reference range applies only to samples taken after fasting for at least 8 hours.   BUN 19 8 - 23 mg/dL   Creatinine, Ser 1.61 (H) 0.61 - 1.24 mg/dL   Calcium 8.4 (L) 8.9 - 10.3 mg/dL   Total Protein 6.9 6.5 - 8.1 g/dL   Albumin 3.5 3.5 - 5.0 g/dL   AST 25 15 - 41 U/L    Comment: HEMOLYSIS AT THIS LEVEL MAY AFFECT RESULT   ALT 16 0 - 44 U/L    Comment: HEMOLYSIS AT THIS LEVEL MAY AFFECT RESULT    Alkaline Phosphatase 70 38 - 126 U/L   Total Bilirubin 1.0 0.3 - 1.2 mg/dL    Comment: HEMOLYSIS AT THIS LEVEL MAY AFFECT RESULT   GFR, Estimated 36 (L) >60 mL/min    Comment: (NOTE) Calculated using the CKD-EPI Creatinine Equation (2021)    Anion gap 9 5 - 15    Comment: Performed at [facility], [address], [place], [place] [address]  Troponin I (High Sensitivity)     Status: Abnormal   Collection Time: 07/25/22 12:17 AM  Result Value Ref Range   Troponin I (High Sensitivity) 45 (H) <18 ng/L    Comment: (NOTE) Elevated high sensitivity troponin I (hsTnI) values and significant  changes across serial measurements may suggest ACS but many other  chronic and acute conditions are known to elevate hsTnI results.  Refer to the "Links" section for chest pain algorithms and additional  guidance. Performed at [facility], [address], [place], [place] [address]   CBC with Differential     Status: Abnormal   Collection Time: 07/25/22 12:17 AM  Result Value Ref Range   WBC 12.1 (H) 4.0 - 10.5 K/uL   RBC 3.63 (L) 4.22 - 5.81 MIL/uL   Hemoglobin 11.6 (L) 13.0 - 17.0 g/dL   HCT 11.9 (L) 14.7 - 82.9 %   MCV 96.4 80.0 - 100.0 fL   MCH 32.0 26.0 - 34.0 pg   MCHC 33.1 30.0 - 36.0 g/dL   RDW 56.2 13.0 - 86.5 %   Platelets 289 150 - 400 K/uL   nRBC 0.0 0.0 - 0.2 %   Neutrophils Relative % 53 %   Neutro Abs 6.3 1.7 - 7.7 K/uL   Lymphocytes Relative 29 %   Lymphs Abs 3.5 0.7 - 4.0 K/uL   Monocytes Relative 7 %   Monocytes Absolute 0.9 0.1 - 1.0 K/uL   Eosinophils Relative 11 %   Eosinophils Absolute 1.3 (H) 0.0 - 0.5 K/uL   Basophils Relative 0 %   Basophils Absolute 0.0 0.0 - 0.1 K/uL   Immature Granulocytes 0 %   Abs Immature Granulocytes 0.05 0.00 - 0.07 K/uL    Comment: Performed at [facility], [address], [place], [place] [address]  Brain natriuretic peptide     Status: None   Collection Time: 07/25/22 12:17 AM  Result Value Ref Range   B  Natriuretic Peptide 18.8 0.0 - 100.0 pg/mL    Comment: Performed at [facility], [address]  [place], [place] [address]  I-Stat venous blood gas, ED     Status: Abnormal   Collection Time: 07/25/22 12:39 AM  Result Value Ref Range   pH, Ven 7.347 7.25 - 7.43   pCO2, Ven 49.6 44 - 60 mmHg   pO2, Ven 35 32 - 45 mmHg   Bicarbonate 27.2 20.0 - 28.0 mmol/L   TCO2 29 22 - 32 mmol/L   O2 Saturation 62 %   Acid-Base Excess 1.0 0.0 - 2.0 mmol/L   Sodium 142 135 - 145 mmol/L   Potassium 4.3 3.5 - 5.1 mmol/L   Calcium, Ion 1.12 (L) 1.15 - 1.40 mmol/L   HCT 36.0 (L) 39.0 - 52.0 %   Hemoglobin 12.2 (L) 13.0 - 17.0 g/dL   Sample type VENOUS   I-Stat venous blood gas, ED     Status: Abnormal   Collection Time: 07/25/22 12:41 AM  Result Value Ref Range   pH, Ven 7.348 7.25 - 7.43   pCO2, Ven 50.1 44 - 60 mmHg   pO2, Ven 41 32 - 45 mmHg   Bicarbonate 27.6 20.0 - 28.0 mmol/L   TCO2 29 22 - 32 mmol/L   O2 Saturation 72 %   Acid-Base Excess 1.0 0.0 - 2.0 mmol/L   Sodium 139 135 - 145 mmol/L   Potassium 6.4 (HH) 3.5 - 5.1 mmol/L   Calcium, Ion 1.06 (L) 1.15 - 1.40 mmol/L   HCT 36.0 (L) 39.0 - 52.0 %   Hemoglobin 12.2 (L) 13.0 - 17.0 g/dL   Sample type VENOUS    Comment NOTIFIED PHYSICIAN   Troponin I (High Sensitivity)     Status: Abnormal   Collection Time: 07/25/22  2:59 AM  Result Value Ref Range   Troponin I (High Sensitivity) 41 (H) <18 ng/L    Comment: (NOTE) Elevated high sensitivity troponin I (hsTnI) values and significant  changes across serial measurements may suggest ACS but many other  chronic and acute conditions are known to elevate hsTnI results.  Refer to the "Links" section for chest pain algorithms and additional  guidance. Performed at [facility], [address] [place], [place] [address]   Basic Metabolic Panel (BMET)     Status: Abnormal   Collection Time: 07/26/22 11:04 AM  Result Value Ref Range   Sodium 139 135 - 145 mEq/L   Potassium 4.7  3.5 - 5.1 mEq/L   Chloride 106 96 - 112 mEq/L   CO2 24 19 - 32 mEq/L   Glucose, Bld 84 70 - 99 mg/dL   BUN 27 (H) 6 - 23 mg/dL   Creatinine, Ser 8.29 (H) 0.40 - 1.50 mg/dL   GFR 56.21 (L) >30.86 mL/min    Comment: Calculated using the CKD-EPI Creatinine Equation (2021)   Calcium 9.2 8.4 - 10.5 mg/dL  Basic metabolic panel     Status: Abnormal   Collection Time: 08/15/22  6:59 AM  Result Value Ref Range   Sodium 138 135 - 145 mmol/L   Potassium 4.4 3.5 - 5.1 mmol/L   Chloride 106 98 - 111 mmol/L   CO2 21 (L) 22 - 32 mmol/L   Glucose, Bld 95 70 - 99 mg/dL    Comment: Glucose reference range applies only to samples taken after fasting for at least 8 hours.   BUN 21 8 - 23 mg/dL   Creatinine, Ser 5.78 (H) 0.61 - 1.24 mg/dL   Calcium 9.0 8.9 - 46.9 mg/dL   GFR, Estimated 39 (L) >60 mL/min  Comment: (NOTE) Calculated using the CKD-EPI Creatinine Equation (2021)    Anion gap 11 5 - 15    Comment: Performed at [facility], [address], [place], [place] [address]  CBC     Status: Abnormal   Collection Time: 08/15/22  6:59 AM  Result Value Ref Range   WBC 8.8 4.0 - 10.5 K/uL   RBC 3.65 (L) 4.22 - 5.81 MIL/uL   Hemoglobin 11.5 (L) 13.0 - 17.0 g/dL   HCT 29.5 (L) 62.1 - 30.8 %   MCV 93.4 80.0 - 100.0 fL   MCH 31.5 26.0 - 34.0 pg   MCHC 33.7 30.0 - 36.0 g/dL   RDW 65.7 84.6 - 96.2 %   Platelets 310 150 - 400 K/uL   nRBC 0.0 0.0 - 0.2 %    Comment: Performed at [facility], [address] [place], [place] [address]  CBC with Differential     Status: Abnormal   Collection Time: 08/17/22  7:36 AM  Result Value Ref Range   WBC 12.2 (H) 4.0 - 10.5 K/uL   RBC 3.51 (L) 4.22 - 5.81 MIL/uL   Hemoglobin 11.1 (L) 13.0 - 17.0 g/dL   HCT 13.2 (L) 44.0 - 10.2 %   MCV 94.9 80.0 - 100.0 fL   MCH 31.6 26.0 - 34.0 pg   MCHC 33.3 30.0 - 36.0 g/dL   RDW 72.5 36.6 - 44.0 %   Platelets 360 150 - 400 K/uL   nRBC 0.0 0.0 - 0.2 %   Neutrophils Relative % 81 %   Neutro Abs 9.8 (H)  1.7 - 7.7 K/uL   Lymphocytes Relative 14 %   Lymphs Abs 1.7 0.7 - 4.0 K/uL   Monocytes Relative 5 %   Monocytes Absolute 0.6 0.1 - 1.0 K/uL   Eosinophils Relative 0 %   Eosinophils Absolute 0.0 0.0 - 0.5 K/uL   Basophils Relative 0 %   Basophils Absolute 0.0 0.0 - 0.1 K/uL   Immature Granulocytes 0 %   Abs Immature Granulocytes 0.04 0.00 - 0.07 K/uL    Comment: Performed at [facility], [address], [place], [place] [address]  Basic metabolic panel     Status: Abnormal   Collection Time: 08/17/22  8:15 AM  Result Value Ref Range   Sodium 136 135 - 145 mmol/L   Potassium 5.5 (H) 3.5 - 5.1 mmol/L    Comment: HEMOLYSIS AT THIS LEVEL MAY AFFECT RESULT   Chloride 101 98 - 111 mmol/L   CO2 22 22 - 32 mmol/L   Glucose, Bld 133 (H) 70 - 99 mg/dL    Comment: Glucose reference range applies only to samples taken after fasting for at least 8 hours.   BUN 34 (H) 8 - 23 mg/dL   Creatinine, Ser 5.95 (H) 0.61 - 1.24 mg/dL   Calcium 8.9 8.9 - 63.8 mg/dL   GFR, Estimated 26 (L) >60 mL/min    Comment: (NOTE) Calculated using the CKD-EPI Creatinine Equation (2021)    Anion gap 13 5 - 15    Comment: Performed at [facility], [address], [place], [place] [address]        Garner Nash, MD, MS

## 2022-08-18 NOTE — Patient Instructions (Signed)
VISIT SUMMARY:  During your visit, we discussed your recent COPD exacerbations, high blood pressure, vision changes, and upcoming colonoscopy. We also talked about your general health maintenance.  YOUR PLAN:  -COPD EXACERBATION: COPD, or Chronic Obstructive Pulmonary Disease, is a lung disease that makes it hard to breathe. You've been having more frequent flare-ups recently. We will continue your current medication, Trelegy, and add a new medication to help break up the mucus in your lungs. You also have a follow-up appointment with your lung specialist on August 12, 2022.  -HYPERTENSION: Hypertension, or high blood pressure, can lead to serious health problems if not controlled. Despite being on several medications, your blood pressure is still high. We will continue your current medications, please start hydralazine and metoprolol as prescribed by cardiology, and stop carvedilol because you're already taking a similar medication.  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 We will refer you to a neurologist and an ophthalmologist for further evaluation.  -COLONOSCOPY: A colonoscopy is a procedure to examine the inside of your colon. Your heart doctor has cleared you for the procedure, but recommends that we first improve your COPD management. Therefore, we will postpone the colonoscopy until after your lung specialist appointment.  -GENERAL HEALTH MAINTENANCE: For your overall health, continue taking aspirin daily. It's also recommended that you quit smoking, eat a low salt and low fat diet, and exercise regularly.  INSTRUCTIONS:  Please continue taking your current medications as prescribed. Start taking hydralazine and metoprolol as prescribed and stop taking carvedilol. Use the nebulized acetylcysteine as  needed to help with your COPD symptoms. Make sure to schedule your follow-up appointments with the pulmonologist, neurologist, and ophthalmologist. We will discuss your colonoscopy after your lung specialist appointment.

## 2022-08-21 ENCOUNTER — Other Ambulatory Visit: Payer: Self-pay | Admitting: Student

## 2022-08-21 DIAGNOSIS — N183 Chronic kidney disease, stage 3 unspecified: Secondary | ICD-10-CM

## 2022-08-21 DIAGNOSIS — J449 Chronic obstructive pulmonary disease, unspecified: Secondary | ICD-10-CM

## 2022-08-21 DIAGNOSIS — I5032 Chronic diastolic (congestive) heart failure: Secondary | ICD-10-CM

## 2022-08-21 DIAGNOSIS — I739 Peripheral vascular disease, unspecified: Secondary | ICD-10-CM

## 2022-08-21 DIAGNOSIS — I480 Paroxysmal atrial fibrillation: Secondary | ICD-10-CM

## 2022-08-21 DIAGNOSIS — H547 Unspecified visual loss: Secondary | ICD-10-CM

## 2022-08-21 DIAGNOSIS — Z01818 Encounter for other preprocedural examination: Secondary | ICD-10-CM

## 2022-08-21 DIAGNOSIS — I10 Essential (primary) hypertension: Secondary | ICD-10-CM

## 2022-08-21 DIAGNOSIS — E785 Hyperlipidemia, unspecified: Secondary | ICD-10-CM

## 2022-08-21 DIAGNOSIS — Z720 Tobacco use: Secondary | ICD-10-CM

## 2022-08-25 DIAGNOSIS — I480 Paroxysmal atrial fibrillation: Secondary | ICD-10-CM | POA: Diagnosis not present

## 2022-08-25 DIAGNOSIS — I5032 Chronic diastolic (congestive) heart failure: Secondary | ICD-10-CM

## 2022-08-25 DIAGNOSIS — H547 Unspecified visual loss: Secondary | ICD-10-CM

## 2022-08-28 NOTE — Telephone Encounter (Deleted)
UPDATED CLEARANCE. PT WAS PREVIOUSLY CLEARED BUT SURGERY HAS BEEN RESCHEDULED TO SEPTEMBER. WILL ROUTE CLEARANCE TO REQUESTING OFFICE

## 2022-08-28 NOTE — Telephone Encounter (Signed)
 This encounter was created in error - please disregard.

## 2022-08-30 ENCOUNTER — Encounter: Payer: Self-pay | Admitting: Neurology

## 2022-09-05 ENCOUNTER — Ambulatory Visit (HOSPITAL_BASED_OUTPATIENT_CLINIC_OR_DEPARTMENT_OTHER)
Admission: RE | Admit: 2022-09-05 | Discharge: 2022-09-05 | Disposition: A | Discharge status: Home / Self Care | Payer: Medicaid Other | Source: Ambulatory Visit | Attending: Student | Admitting: Student

## 2022-09-05 ENCOUNTER — Ambulatory Visit (HOSPITAL_COMMUNITY)
Admission: RE | Admit: 2022-09-05 | Discharge: 2022-09-05 | Disposition: A | Discharge status: Home / Self Care | Payer: Medicaid Other | Source: Ambulatory Visit | Attending: Student | Admitting: Student

## 2022-09-05 DIAGNOSIS — H53122 Transient visual loss, left eye: Secondary | ICD-10-CM | POA: Insufficient documentation

## 2022-09-05 DIAGNOSIS — I739 Peripheral vascular disease, unspecified: Secondary | ICD-10-CM | POA: Diagnosis not present

## 2022-09-05 DIAGNOSIS — H547 Unspecified visual loss: Secondary | ICD-10-CM | POA: Insufficient documentation

## 2022-09-05 LAB — VAS US ABI WITH/WO TBI
Left ABI: 0.86
Right ABI: 0.86

## 2022-09-06 ENCOUNTER — Other Ambulatory Visit: Payer: Self-pay | Admitting: *Deleted

## 2022-09-06 ENCOUNTER — Ambulatory Visit
Admission: RE | Admit: 2022-09-06 | Discharge: 2022-09-06 | Disposition: A | Discharge status: Home / Self Care | Payer: Medicaid Other | Source: Ambulatory Visit | Attending: Nurse Practitioner | Admitting: Nurse Practitioner

## 2022-09-06 ENCOUNTER — Ambulatory Visit: Payer: Medicaid Other | Admitting: Neurology

## 2022-09-06 DIAGNOSIS — I739 Peripheral vascular disease, unspecified: Secondary | ICD-10-CM

## 2022-09-06 DIAGNOSIS — R911 Solitary pulmonary nodule: Secondary | ICD-10-CM

## 2022-09-07 ENCOUNTER — Encounter: Payer: Self-pay | Admitting: *Deleted

## 2022-09-07 ENCOUNTER — Ambulatory Visit: Payer: Medicaid Other | Admitting: Family Medicine

## 2022-09-07 ENCOUNTER — Other Ambulatory Visit: Payer: Medicaid Other | Admitting: *Deleted

## 2022-09-07 ENCOUNTER — Telehealth: Payer: Self-pay | Admitting: Nurse Practitioner

## 2022-09-07 ENCOUNTER — Other Ambulatory Visit (HOSPITAL_COMMUNITY): Payer: Self-pay

## 2022-09-07 NOTE — Patient Outreach (Addendum)
Medicaid Managed Care   Nurse Care Manager Note  09/07/2022 Name:  Kevin [name] MRN:  [id number] DOB:  [birth year]  Kevin [name] is an 67 y.o. year old male who is a primary patient of Kevin Gunner, MD.  The [facility] Managed Care Coordination team was consulted for assistance with:    HTN COPD Smoking Cessation  Kevin [name] was given information about Medicaid Managed Care Coordination team services today. [name] Patient agreed to services and verbal consent obtained.  Engaged with patient by telephone for follow up visit in response to provider referral for case management and/or care coordination services.   Assessments/Interventions:  Review of past medical history, allergies, medications, health status, including review of consultants reports, laboratory and other test data, was performed as part of comprehensive evaluation and provision of chronic care management services.  SDOH (Social Determinants of Health) assessments and interventions performed: SDOH Interventions    Flowsheet Row Office Visit from 08/08/2022 in [facility] Patient Outreach Telephone from 08/07/2022 in [place] POPULATION HEALTH DEPARTMENT Patient Outreach Telephone from 07/04/2022 in [place] POPULATION HEALTH DEPARTMENT Patient Outreach Telephone from 06/02/2022 in [place] POPULATION HEALTH DEPARTMENT Patient Outreach Telephone from 05/04/2022 in [place] POPULATION HEALTH DEPARTMENT Patient Outreach Telephone from 05/03/2022 in [name] POPULATION HEALTH DEPARTMENT  SDOH Interventions        Food Insecurity Interventions -- Intervention Not Indicated -- -- -- Intervention Not Indicated  Housing Interventions -- Intervention Not Indicated -- Intervention Not Indicated -- --  Transportation Interventions -- -- Payor Benefit  [Provided with [phone]] Intervention Not Indicated -- Payor Benefit  [employer]  [phone]  Utilities Interventions -- Other (Comment)  [Advised patient to contact [employer] for increased bill and inquire about financial assistance programs] -- -- -- --  Depression Interventions/Treatment  Patient refuses Treatment -- -- -- -- --  Stress Interventions -- -- -- -- [employer], Provide Counseling --       Care Plan  No Known Allergies  Medications Reviewed Today     Reviewed by [name], RN (Registered Nurse) on 09/07/22 at 1102  Med List Status: <None>   Medication Order Taking? Sig Documenting Provider Last Dose Status Informant  acetylcysteine (MUCOMYST) 10 % nebulizer solution [id number] No Take 4 mLs by nebulization every 4 (four) hours.  Patient not taking: Reported on 09/07/2022   Kevin Gunner, MD Not Taking Active   albuterol (VENTOLIN HFA) 108 (90 Base) MCG/ACT inhaler [id number] Yes Inhale 2 puffs into the lungs every 4 (four) hours as needed for wheezing or shortness of breath. Tilden Fossa, MD Taking Active Self, Child  amLODipine (NORVASC) 10 MG tablet [id number] Yes Take 1 tablet (10 mg total) by mouth daily. Corrin Parker, PA-C Taking Active Self, Child  aspirin EC 81 MG tablet [id number] Yes Take 1 tablet (81 mg total) by mouth daily. Swallow whole.  Patient taking differently: Take 162 mg by mouth every 6 (six) hours as needed for mild pain. Swallow whole.   Kevin Gunner, MD Taking Active Self, Child  atorvastatin (LIPITOR) 40 MG tablet [id number] Yes Take 1 tablet (40 mg total) by mouth daily. Kevin Gunner, MD Taking Active Self, Child           Med Note [name], [name]   Mon Aug 07, 2022 10:44 AM)    benzonatate (TESSALON) 200 MG capsule [id number] No Take 1 capsule (200 mg total)  by mouth 3 (three) times daily as needed for cough.  Patient not taking: Reported on 08/16/2022   [name], NP Not Taking Active Self, Child  carvedilol (COREG) 6.25 MG tablet [id number] Yes Take 6.25 mg by  mouth 2 (two) times daily with a meal. [provider] Taking Active   dextromethorphan-guaiFENesin (MUCINEX DM) 30-600 MG 12hr tablet [id number] No Take 1 tablet by mouth 2 (two) times daily.  Patient not taking: Reported on 08/17/2022   Kevin Gunner, MD Not Taking Active Self, Child  dolutegravir-lamiVUDine (DOVATO) 50-300 MG tablet [id number] Yes Take 1 tablet by mouth daily. [name], [name], MD Taking Active Self, Child  doxycycline (VIBRAMYCIN) 100 MG capsule [id number] No Take 1 capsule (100 mg total) by mouth 2 (two) times daily.  Patient not taking: Reported on 08/17/2022   Tilden Fossa, MD Not Taking Active Self, Child           Med Note ([place]ps A   Thu Sep 07, 2022 10:53 AM) completed  Fluticasone-Umeclidin-Vilant (TRELEGY ELLIPTA) 100-62.5-25 MCG/ACT AEPB [id number] Yes Inhale 1 puff into the lungs daily. [name], NP Taking Active Self, Child  guaiFENesin-dextromethorphan (ROBITUSSIN DM) 100-10 MG/5ML syrup [id number] No Take 5 mLs by mouth every 6 (six) hours as needed for cough.  Patient not taking: Reported on 08/17/2022   [name], NP Not Taking Active Self, Child  hydrALAZINE (APRESOLINE) 25 MG tablet [id number] Yes Take 1 tablet (25 mg total) by mouth 3 (three) times daily. Corrin Parker, PA-C Taking Active Self, Child  ipratropium-albuterol (DUONEB) 0.5-2.5 (3) MG/3ML SOLN [id number] No Take 3 mLs by nebulization every 6 (six) hours as needed (wheezing or shortness of breath).  Patient not taking: Reported on 09/07/2022   Gwyneth Sprout, MD Not Taking Active            Med Note (Anchor Dwan A   Thu Sep 07, 2022 10:55 AM) Unable to receive from the pharmacy  metoprolol tartrate (LOPRESSOR) 25 MG tablet [id number] No Take 1 tablet (25 mg total) by mouth 2 (two) times daily.  Patient not taking: Reported on 08/17/2022   Corrin Parker, PA-C Not Taking Active Self, Child  nicotine (NICODERM CQ - DOSED IN MG/24 HOURS) 14 mg/24hr patch  [id number] No Place 1 patch (14 mg total) onto the skin daily.  Patient not taking: Reported on 08/16/2022   [name], NP Not Taking Active Self, Child  oxyCODONE-acetaminophen (PERCOCET) 7.5-325 MG tablet [id number] Yes Take 1 tablet by mouth 3 (three) times daily. [provider] Taking Active Self, Child  predniSONE (DELTASONE) 20 MG tablet [id number] No Take 2 tablets (40 mg total) by mouth daily. Take 40mg  (2 tabs) for 2 days, then take 20mg  (1tab) for 2 days and then take 10mg  (0.5 tab) po for 2 days  Patient not taking: Reported on 09/07/2022   Gwyneth Sprout, MD Not Taking Active            Med Note ([facility] A   Thu Sep 07, 2022 10:57 AM) completed  valsartan (DIOVAN) 320 MG tablet [id number] Yes Take 320 mg by mouth daily. [provider] Taking Active Self, Child            Patient Active Problem List   Diagnosis Date Noted   Amaurosis fugax 08/18/2022   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   Skin nodule 08/08/2022   Lung nodule, multiple 07/26/2022   Bloody stool 07/26/2022   Lipoma 07/06/2022   Chronic low  back pain 04/29/2022   Chronic heart failure with preserved ejection fraction (HFpEF) (HCC) 12/27/2021   HLD (hyperlipidemia) 12/27/2021   Chest pain 12/27/2021   Elevated troponin 12/27/2021   Acute diastolic heart failure (HCC) 07/14/2021   Right renal mass 06/29/2021   Renal lesion 06/29/2021   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   Hypertensive crisis    Acute respiratory failure with hypoxia (HCC)    Dyspnea 04/04/2019   Callus of foot 07/10/2018   Hypertensive urgency 08/15/2017   Coronary artery disease involving native coronary artery of native heart with unstable angina pectoris (HCC) 10/19/2016   Easy bruising 08/11/2016   Claudication in peripheral vascular disease (HCC) 06/05/2016   Stage 3 chronic kidney disease (HCC)    Peripheral arterial disease (HCC) 05/09/2016   Acute on chronic diastolic CHF (congestive  heart failure), NYHA class 3 (HCC) 03/24/2016   OSA (obstructive sleep apnea) 03/24/2016   Essential hypertension 02/18/2016   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   Shortness of breath 02/07/2016   PAF (paroxysmal atrial fibrillation) (HCC) 02/07/2016   Tobacco abuse 02/07/2016   Acute on chronic renal insufficiency 02/07/2016   COPD exacerbation (HCC) 02/07/2016   Hypertensive emergency 02/07/2016   Blurry vision, bilateral    HIV disease (HCC) 08/27/2004    Conditions to be addressed/monitored per PCP order:  HTN, COPD, and Tobacco Use  Care Plan : RN Care Manager Plan of Care  Updates made by [name], RN since 09/07/2022 12:00 AM     Problem: Health Management needs related to COPD      Long-Range Goal: Development of Plan of Care to address Health Management needs related to COPD   Start Date: 05/03/2022  Expected End Date: 10/13/2022  Note:   Current Barriers:  Chronic Disease Management support and education needs related to HTN and COPD  RNCM Clinical Goal(s):  Patient will verbalize understanding of plan for management of HTN and COPD as evidenced by patient reports take all medications exactly as prescribed and will call provider for medication related questions as evidenced by patient reports    attend all scheduled medical appointments: PCP on 7/29, Neurology 8/14, HTN Clinic 8/22 as evidenced by provider documentation        through collaboration with RN Care manager, provider, and care team.   Interventions: Inter-disciplinary care team collaboration (see longitudinal plan of care) Evaluation of current treatment plan related to  self management and patient's adherence to plan as established by provider Provided therapeutic listening Discussed Opthalmology referral-patient thinks he has an appointment with [facility] [phone] in August, he will call today, to verify   Hypertension Interventions:  (Status:  Goal on track:  Yes.) Long Term  Goal Last practice recorded BP readings:  BP Readings from Last 3 Encounters:  08/18/22 (!) 166/82  08/17/22 (!) 171/116  08/16/22 (!) 162/90   Most recent eGFR/CrCl:  Lab Results  Component Value Date   EGFR 36 (L) 07/06/2022    No components found for: "CRCL"  Evaluation of current treatment plan related to hypertension self management and patient's adherence to plan as established by provider Reviewed medications with patient and discussed importance of compliance Discussed complications of poorly controlled blood pressure such as heart disease, stroke, circulatory complications, vision complications, kidney impairment, sexual dysfunction Assessed social determinant of health barriers Advised patient to take all medications to all provider appointments Collaborated with PCP regarding medication reconciliation-patient taking carvedilol, per Dr. Carollee Massed last note carvedilol was discontinued Discussed the importance  of smoking cessation, improving diet and exercise in controlling HTN Pharmacy referral for uncontrolled HTN    Smoking Cessation Interventions:  (Status:  Goal on track:  Yes.) Long Term Goal currently smoking 3-5 cigarettes/day Reviewed smoking history:  tobacco abuse of >20 years; currently smoking 1-2 ppd Previous quit attempts, unsuccessful several /successful using none  Reports smoking within 30 minutes of waking up Reports triggers to smoke include: stress Reports motivation to quit smoking includes: family and health On a scale of 1-10, reports MOTIVATION to quit is 10 On a scale of 1-10, reports CONFIDENCE in quitting is 10  Evaluation of current treatment plan reviewed Provided patient with printed smoking cessation educational materials Provided contact information for  Quit Line (1-800-QUIT-NOW) Assessed social determinant of health barriers   COPD: (Status: Goal on Track (progressing): YES.) Long Term Goal  Reviewed medications with  patient Provided instruction about proper use of medications used for management of COPD including inhalers Advised patient to self assesses COPD action plan zone and make appointment with provider if in the yellow zone for 48 hours without improvement Advised patient to engage in light exercise as tolerated 3-5 days a week to aid in the the management of COPD Provided education about and advised patient to utilize infection prevention strategies to reduce risk of respiratory infection Discussed the importance of adequate rest and management of fatigue with COPD Assessed social determinant of health barriers Advised patient to call and schedule follow up with Pulmonology-RNCM messaged scheduler to assist patient with scheduling follow up  Patient Goals/Self-Care Activities: Take medications as prescribed   Attend all scheduled provider appointments Call pharmacy for medication refills 3-7 days in advance of running out of medications Call provider office for new concerns or questions  Work with the social worker to address care coordination needs and will continue to work with the clinical team to address health care and disease management related needs identify and avoid work-related triggers develop a rescue plan develop a new routine to improve sleep get at least 7 to 8 hours of sleep at night       Follow Up:  Patient agrees to Care Plan and Follow-up.  Plan: The Managed Medicaid care management team will reach out to the patient again over the next 60 days.  Date/time of next scheduled RN care management/care coordination outreach:  11/07/22 @ 10:30pm  [name] RN, BSN [place]  Managed [facility] RN Care Coordinator [phone]

## 2022-09-07 NOTE — Patient Instructions (Addendum)
Visit Information  Kevin [name] was given information about Medicaid Managed Care team care coordination services as a part of their Healthy [facility] Medicaid benefit. Kevin [name] verbally consented to engagement with the [facility] Managed Care team.   If you are experiencing a medical emergency, please call 911 or report to your local emergency department or urgent care.   If you have a non-emergency medical problem during routine business hours, please contact your provider's office and ask to speak with a nurse.   For questions related to your Healthy [facility] health plan, please call: [phone] or visit the homepage here: [email or web address]  If you would like to schedule transportation through your Healthy [facility] plan, please call the following number at least 2 days in advance of your appointment: [phone]  For information about your ride after you set it up, call Ride Assist at [phone]. Use this number to activate a Will Call pickup, or if your transportation is late for a scheduled pickup. Use this number, too, if you need to make a change or cancel a previously scheduled reservation.  If you need transportation services right away, call [phone]. The after-hours call center is staffed 24 hours to handle ride assistance and urgent reservation requests (including discharges) 365 days a year. Urgent trips include sick visits, hospital discharge requests and life-sustaining treatment.  Call the [facility] Line at [phone], at any time, 24 hours a day, 7 days a week. If you are in danger or need immediate medical attention call 911.  If you would like help to quit smoking, call 1-800-QUIT-NOW (336-700-6333) OR Espaol: 1-855-Djelo-Ya (6-301-601-0932) o para ms informacin haga clic aqu or Text READY to 355-732 to register via text  Kevin [name],   Please see education materials related to HTN and COPD provided  by MyChart link.  Patient verbalizes understanding of instructions and care plan provided today and agrees to view in MyChart. Active MyChart status and patient understanding of how to access instructions and care plan via MyChart confirmed with patient.     Telephone follow up appointment with Managed Medicaid care management team member scheduled for:11/07/22 @ 10:30am  Kevin Emms RN, BSN [place]  Managed [facility] RN Care Coordinator [phone]   Following is a copy of your plan of care:  Care Plan : RN Care Manager Plan of Care  Updates made by [name], RN since 09/07/2022 12:00 AM     Problem: Health Management needs related to COPD      Long-Range Goal: Development of Plan of Care to address Health Management needs related to COPD   Start Date: 05/03/2022  Expected End Date: 10/13/2022  Note:   Current Barriers:  Chronic Disease Management support and education needs related to HTN and COPD  RNCM Clinical Goal(s):  Patient will verbalize understanding of plan for management of HTN and COPD as evidenced by patient reports take all medications exactly as prescribed and will call provider for medication related questions as evidenced by patient reports    attend all scheduled medical appointments: PCP on 7/29, Neurology 8/14, HTN Clinic 8/22 as evidenced by provider documentation        through collaboration with RN Care manager, provider, and care team.   Interventions: Inter-disciplinary care team collaboration (see longitudinal plan of care) Evaluation of current treatment plan related to  self management and patient's adherence to plan as established by provider Provided therapeutic listening Discussed Opthalmology referral-patient thinks he has an appointment with [facility]  [phone] in August, he will call today, to verify   Hypertension Interventions:  (Status:  Goal on track:  Yes.) Long Term Goal Last practice recorded BP readings:  BP Readings  from Last 3 Encounters:  08/18/22 (!) 166/82  08/17/22 (!) 171/116  08/16/22 (!) 162/90   Most recent eGFR/CrCl:  Lab Results  Component Value Date   EGFR 36 (L) 07/06/2022    No components found for: "CRCL"  Evaluation of current treatment plan related to hypertension self management and patient's adherence to plan as established by provider Reviewed medications with patient and discussed importance of compliance Discussed complications of poorly controlled blood pressure such as heart disease, stroke, circulatory complications, vision complications, kidney impairment, sexual dysfunction Assessed social determinant of health barriers Advised patient to take all medications to all provider appointments Collaborated with PCP regarding medication reconciliation-patient taking carvedilol, per Dr. Carollee Massed last note carvedilol was discontinued Discussed the importance of smoking cessation, improving diet and exercise in controlling HTN Pharmacy referral for uncontrolled HTN    Smoking Cessation Interventions:  (Status:  Goal on track:  Yes.) Long Term Goal currently smoking 3-5 cigarettes/day Reviewed smoking history:  tobacco abuse of >20 years; currently smoking 1-2 ppd Previous quit attempts, unsuccessful several /successful using none  Reports smoking within 30 minutes of waking up Reports triggers to smoke include: stress Reports motivation to quit smoking includes: family and health On a scale of 1-10, reports MOTIVATION to quit is 10 On a scale of 1-10, reports CONFIDENCE in quitting is 10  Evaluation of current treatment plan reviewed Provided patient with printed smoking cessation educational materials Provided contact information for [place] Quit Line (1-800-QUIT-NOW) Assessed social determinant of health barriers   COPD: (Status: Goal on Track (progressing): YES.) Long Term Goal  Reviewed medications with patient Provided instruction about proper use of medications used  for management of COPD including inhalers Advised patient to self assesses COPD action plan zone and make appointment with provider if in the yellow zone for 48 hours without improvement Advised patient to engage in light exercise as tolerated 3-5 days a week to aid in the the management of COPD Provided education about and advised patient to utilize infection prevention strategies to reduce risk of respiratory infection Discussed the importance of adequate rest and management of fatigue with COPD Assessed social determinant of health barriers Advised patient to call and schedule follow up with Pulmonology-RNCM messaged scheduler to assist patient with scheduling follow up  Patient Goals/Self-Care Activities: Take medications as prescribed   Attend all scheduled provider appointments Call pharmacy for medication refills 3-7 days in advance of running out of medications Call provider office for new concerns or questions  Work with the social worker to address care coordination needs and will continue to work with the clinical team to address health care and disease management related needs identify and avoid work-related triggers develop a rescue plan develop a new routine to improve sleep get at least 7 to 8 hours of sleep at night

## 2022-09-07 NOTE — Telephone Encounter (Signed)
Received In Basket from [facility], RN noting that patient was not seen for his followup after 6/14 visit. Left voicemail for him to schedule OV with [name].

## 2022-09-08 ENCOUNTER — Ambulatory Visit (INDEPENDENT_AMBULATORY_CARE_PROVIDER_SITE_OTHER): Payer: Medicaid Other | Admitting: Nurse Practitioner

## 2022-09-08 ENCOUNTER — Encounter: Payer: Self-pay | Admitting: Nurse Practitioner

## 2022-09-08 ENCOUNTER — Telehealth: Payer: Self-pay

## 2022-09-08 VITALS — BP 180/100 | HR 62 | Ht 72.0 in | Wt 161.4 lb

## 2022-09-08 DIAGNOSIS — J439 Emphysema, unspecified: Secondary | ICD-10-CM | POA: Diagnosis not present

## 2022-09-08 DIAGNOSIS — I10 Essential (primary) hypertension: Secondary | ICD-10-CM

## 2022-09-08 DIAGNOSIS — G4733 Obstructive sleep apnea (adult) (pediatric): Secondary | ICD-10-CM

## 2022-09-08 DIAGNOSIS — J4489 Other specified chronic obstructive pulmonary disease: Secondary | ICD-10-CM | POA: Diagnosis not present

## 2022-09-08 MED ORDER — MONTELUKAST SODIUM 10 MG PO TABS
10.0000 mg | ORAL_TABLET | Freq: Every day | ORAL | 5 refills | Status: DC
Start: 2022-09-08 — End: 2022-09-27

## 2022-09-08 MED ORDER — TRELEGY ELLIPTA 200-62.5-25 MCG/ACT IN AEPB
1.0000 | INHALATION_SPRAY | Freq: Every day | RESPIRATORY_TRACT | 5 refills | Status: AC
Start: 2022-09-08 — End: ?

## 2022-09-08 NOTE — Assessment & Plan Note (Signed)
Per the patient, BP today is at his baseline. Advised him to contact cardiology. No red flag symptoms. See above

## 2022-09-08 NOTE — Progress Notes (Signed)
   09/08/2022  Patient ID: Kevin [name], male   DOB: 28-Nov-1955, 67 y.o.   MRN: [id number]  Telephone visit scheduled for Thursday 8/1 in regard to referral from PCP, Dr. Janee Morn, for hypertension management.  Requested that patient have medications and recent home BP readings ready for [name] to discuss.    Lenna Gilford, PharmD, DPLA

## 2022-09-08 NOTE — Patient Instructions (Addendum)
Continue Trelegy 1 puff daily. Brush tongue and rinse mouth afterwards Continue Albuterol inhaler 2 puffs or 3 mL neb every 6 hours as needed for shortness of breath or wheezing. Notify if symptoms persist despite rescue inhaler/neb use.   Singulair 1 tab At bedtime for COPD/allergies Guaifenesin 600 mg Twice daily for cough/congestion Use nebs twice a day then follow with flutter valve to help clear out phlegm   Will call you about your CT scan results   You need to quit smoking!   Restart noninvasive ventilator at night. You need to be wearing this every night, all night long. You have severe sleep apnea, which is likely contributing to your poor blood pressure control, fatigue, and restless sleep.  We discussed how untreated sleep apnea puts an individual at risk for cardiac arrhthymias, pulm HTN, DM, stroke and increases their risk for daytime accidents.    Follow up in 6-8 weeks after full PFT with Kevin [name] to review PFTs and establish care. If symptoms do not improve or worsen, please contact office for sooner follow up or seek emergency care.

## 2022-09-08 NOTE — Assessment & Plan Note (Signed)
Poorly controlled COPD without acute exacerbation. He has had trouble with recurrent exacerbations. Most recent prednisone course completed 7/10. He is clinically improved today. Has difficulties with chronic bronchitis and dyspnea symptoms. 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 We will step up his Trelegy to 200 mcg and add on singulair to target airway inflammation and trigger prevention. He will maximize mucociliary clearance therapies. Would prefer he quit smoking before addition of daliresp or chronic macrolide/steroid therapy. Smoking cessation advised. Action plan in place.  Patient Instructions  Continue Trelegy 1 puff daily. Brush tongue and rinse mouth afterwards Continue Albuterol inhaler 2 puffs or 3 mL neb every 6 hours as needed for shortness of breath or wheezing. Notify if symptoms persist despite rescue inhaler/neb use.   Singulair 1 tab At bedtime for COPD/allergies Guaifenesin 600 mg Twice daily for cough/congestion Use nebs twice a day then follow with flutter valve to help clear out phlegm   Will call you about your CT scan results   You need to quit smoking!   Restart noninvasive ventilator at night. You need to be wearing this every night, all night long. You have severe sleep apnea, which is likely contributing to your poor blood pressure control, fatigue, and restless sleep.  We discussed how untreated sleep apnea puts an individual at risk for cardiac arrhthymias, pulm HTN, DM, stroke and increases their risk for daytime accidents.    Follow up in 6-8 weeks after full PFT with Dr. Wynona Neat to review PFTs and establish care. If symptoms do not improve or worsen, please contact office for sooner follow up or seek emergency care.

## 2022-09-08 NOTE — Progress Notes (Signed)
@Patient  ID: Kevin [name], male    DOB: 05-Apr-1955, 67 y.o.   MRN: [id number]  Chief Complaint  Patient presents with   Follow-up    No pft, breathing is okay baseline. No concerns. Pt states he has been using his inhaler and it is okay.     Referring provider: [name], MD  HPI: 67 year old male, active smoker followed for COPD and lung nodules.  He was previously followed by Dr. Thora Lance and last seen 07/26/2022 by Allison Quarry NP. Past medical history significant for PAF not on AC, HTN, CHF, PAD, CAD, OSA not on CPAP, CKD, papillary renal cell cancer, HLD, HIV.   TEST/EVENTS:  11/15/2020 CT chest wo con: atherosclerosis. Early/subtle emphysematous changes. Right lung few small foci of ground-glass opacity, 4 mm and 7 mm. Dense scar density in RLL. 3-4 mm nodule in RUL.  12/27/2021 echo: EF 60-65%, GIDD, RV size and function nl. Mildly elevated PASP. LA mildly dilated.  07/25/2022 CXR: very mild bilateral infrahilar atelectasis  07/2022 eos 1300   11/02/2020: OV with Dr. Thora Lance. 2 weeks has been having trouble with breathing, weakness, cough. Given doxy, prednisone, inhalers (dulera and albuterol). CXR there showed only hyperexpansion. Has had no fever but lost 20 lb since last winter (20 lb). No night sweats. Works as a Financial risk analyst. No pet birds or hot tubes. Has never lived outside of [employer]. At risk for COPD and rapid progression given HIV. Did have eosinophils 700. Would also consider PJP, or other HIV associated ILD (although subacute pace would be odd). Repeat CT chest. PFTs in a month.   07/26/2022: OV with Paisley Grajeda NP for overdue follow up. He has had recurrent exacerbations over the last year with numerous trips to the ED. Most recently yesterday, 6/11. CXR showed mild infrahilar atelectasis. He was treated with doxycycline and prednisone taper. He was also ordered Advair but has not picked this up. He is using albuterol multiple times a day. He gets short winded with longer distances and household  chores. He can't mow more than one strip of his yard without having to stop. He does have a cough, which is slightly increased from his baseline. Productive with white phlegm. Not noticing much wheezing today. Denies any fevers, chills, hemoptysis, leg swelling, orthopnea, palpitations. 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 No further weight loss. He did have some blood in his stool, which was worked up at his ED visit 6/5. Hgb stable. He has upcoming colonoscopy for further evaluation. His blood pressure is elevated today but he has not taken his antihypertensives. No headaches, vision changes, CP.   09/08/2022: Today - follow up Patient presents today for follow up. He had his CT scan two days ago; results pending. He never had PFTs. He has had two trips to the ED for AECOPD. He feels better today. He feels like the Trelegy does help him but he's still having some issues with daily chest congestion and cough with white phlegm. Does not feel worse compared to his baseline. Tends to have more trouble when he comes off steroids; last course was completed 7/10. Stable for now. Not noticing much wheezing. He denies leg swelling, hemoptysis, anorexia, weight loss, orthopnea. Uses his rescue a few times a week. Not taking mucinex.  He has been having trouble controlling his BP. He is on numerous different antihypertensives. BP today was 180/100. Denies chest pain, palpitations, vision changes, headaches. He does have severe sleep apnea. He  has a noninvasive vent at home but he does not wear this. He does struggle with night time sleep and daytime fatigue when he doesn't wear it. Machine is only a year or so old. He does have new supplies.   No Known Allergies  Immunization History  Administered Date(s) Administered   Hepatitis A, Adult 08/18/2016   Hepatitis B, ADULT 08/17/2016, 09/18/2016, 08/15/2017   Influenza,inj,Quad PF,6+ Mos 02/10/2016, 11/02/2016,  04/05/2019, 01/20/2022   Pneumococcal Conjugate-13 08/15/2017   Pneumococcal Polysaccharide-23 02/10/2016, 04/05/2019    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    Tobacco History: Social History   Tobacco Use  Smoking Status Every Day   Current packs/day: 0.10   Average packs/day: 0.1 packs/day for 42.0 years (4.2 ttl pk-yrs)   Types: Cigarettes, E-cigarettes   Passive exposure: Never  Smokeless Tobacco Never  Tobacco Comments   1-2 cigarettes a day; vaping   Ready to quit: Not Answered Counseling given: Not Answered Tobacco comments: 1-2 cigarettes a day; vaping   Outpatient Medications Prior to Visit  Medication Sig Dispense Refill   acetylcysteine (MUCOMYST) 10 % nebulizer solution Take 4 mLs by nebulization every 4 (four) hours. 30 mL 12   albuterol (VENTOLIN HFA) 108 (90 Base) MCG/ACT inhaler Inhale 2 puffs into the lungs every 4 (four) hours as needed for wheezing or shortness of breath. 54 g 1   amLODipine (NORVASC) 10 MG tablet Take 1 tablet (10 mg total) by mouth daily. 90 tablet 3   aspirin EC 81 MG tablet Take 1 tablet (81 mg total) by mouth daily. Swallow whole. (Patient taking differently: Take 162 mg by mouth every 6 (six) hours as needed for mild pain. Swallow whole.) 30 tablet 12   atorvastatin (LIPITOR) 40 MG tablet Take 1 tablet (40 mg total) by mouth daily.  60 tablet 3   carvedilol (COREG) 6.25 MG tablet Take 6.25 mg by mouth 2 (two) times daily with a meal.     dextromethorphan-guaiFENesin (MUCINEX DM) 30-600 MG 12hr tablet Take 1 tablet by mouth 2 (two) times daily. 180 tablet 3   dolutegravir-lamiVUDine (DOVATO) 50-300 MG tablet Take 1 tablet by mouth daily. 30 tablet 11   doxycycline (VIBRAMYCIN) 100 MG capsule Take 1 capsule (100 mg total) by mouth 2 (two) times daily. 10 capsule 0   hydrALAZINE (APRESOLINE) 25 MG tablet Take 1 tablet (25 mg total) by mouth 3 (three) times daily. 270 tablet 3   ipratropium-albuterol (DUONEB) 0.5-2.5 (3) MG/3ML SOLN Take 3 mLs by nebulization every 6 (six) hours as needed (wheezing or shortness of breath). 120 mL 1   metoprolol tartrate (LOPRESSOR) 25 MG tablet Take 1 tablet (25 mg total) by mouth 2 (two) times daily. 180 tablet 3   nicotine (NICODERM CQ - DOSED IN MG/24 HOURS) 14 mg/24hr patch Place 1 patch (14 mg total) onto  the skin daily. 28 patch 0   oxyCODONE-acetaminophen (PERCOCET) 7.5-325 MG tablet Take 1 tablet by mouth 3 (three) times daily.     predniSONE (DELTASONE) 20 MG tablet Take 2 tablets (40 mg total) by mouth daily. Take 40mg  (2 tabs) for 2 days, then take 20mg  (1tab) for 2 days and then take 10mg  (0.5 tab) po for 2 days 7 tablet 0   Fluticasone-Umeclidin-Vilant (TRELEGY ELLIPTA) 100-62.5-25 MCG/ACT AEPB Inhale 1 puff into the lungs daily. 60 each 5   benzonatate (TESSALON) 200 MG capsule Take 1 capsule (200 mg total) by mouth 3 (three) times daily as needed for cough. (Patient not taking: Reported on 09/08/2022) 30 capsule 1   guaiFENesin-dextromethorphan (ROBITUSSIN DM) 100-10 MG/5ML syrup Take 5 mLs by mouth every 6 (six) hours as needed for cough. (Patient not taking: Reported on 09/08/2022) 118 mL 0   valsartan (DIOVAN) 320 MG tablet Take 320 mg by mouth daily. (Patient not taking: Reported on 09/08/2022)     No facility-administered medications prior to visit.     Review of Systems:    Constitutional: No weight loss or gain, night sweats, fevers, chills, or lassitude. +fatigue  HEENT: No headaches, difficulty swallowing, tooth/dental problems, or sore throat. No sneezing, itching, ear ache, nasal congestion, or post nasal drip CV:  No chest pain, orthopnea, PND, swelling in lower extremities, anasarca, dizziness, palpitations, syncope Resp: +shortness of breath with exertion; productive cough; rare wheeze. No hemoptysis. No chest wall deformity GI:  No heartburn, indigestion, abdominal pain, nausea, vomiting, diarrhea, change in bowel habits, loss of appetite GU: No dysuria, change in color of urine, urgency or frequency.   Skin: No rash, lesions, ulcerations MSK:  No joint pain or swelling.  Neuro: No dizziness or lightheadedness.  Psych: No depression or anxiety. Mood stable.     Physical Exam:  BP (!) 180/100   Pulse 62   Ht 6' (1.829 m)   Wt 161 lb 6.4 oz (73.2 kg)   SpO2 100%   BMI 21.89 kg/m   GEN: Pleasant, interactive, well-kempt; in no acute distress. HEENT:  Normocephalic and atraumatic. PERRLA. Sclera white. Nasal turbinates pink, moist and patent bilaterally. No rhinorrhea present. Oropharynx pink and moist, without exudate or edema. No lesions, ulcerations, or postnasal drip.  NECK:  Supple w/ fair ROM. No JVD present. Normal carotid impulses w/o bruits. Thyroid symmetrical with no goiter or nodules palpated. No lymphadenopathy.   CV: RRR, no m/r/g, no peripheral edema. Pulses intact, +2 bilaterally. No cyanosis, pallor or clubbing. PULMONARY:  Unlabored, regular breathing. Diminished bilaterally A&P w/o wheezes/rales/rhonchi. No accessory muscle use.  GI: BS present and normoactive. Soft, non-tender to palpation. No organomegaly or masses detected.  MSK: No erythema, warmth or tenderness. Cap refil <2 sec all extrem. No deformities or joint swelling noted. Muscle wasting Neuro: A/Ox3. No focal deficits noted.   Skin: Warm, no lesions or  rashe Psych: Normal affect and behavior. Judgement and thought content appropriate.     Lab Results:  CBC    Component Value Date/Time   WBC 12.2 (H) 08/17/2022 0736   RBC 3.51 (L) 08/17/2022 0736   HGB 11.1 (L) 08/17/2022 0736   HGB 12.6 (L) 07/10/2018 1103   HCT 33.3 (L) 08/17/2022 0736   HCT 38.1 07/10/2018 1103   PLT 360 08/17/2022 0736   PLT 276 07/10/2018 1103   MCV 94.9 08/17/2022 0736   MCV 95 07/10/2018 1103   MCH 31.6 08/17/2022 0736   MCHC 33.3 08/17/2022 0736   RDW  14.8 08/17/2022 0736   RDW 13.2 07/10/2018 1103   LYMPHSABS 1.7 08/17/2022 0736   LYMPHSABS 1.5 07/10/2018 1103   MONOABS 0.6 08/17/2022 0736   EOSABS 0.0 08/17/2022 0736   EOSABS 0.0 07/10/2018 1103   BASOSABS 0.0 08/17/2022 0736   BASOSABS 0.0 07/10/2018 1103    BMET    Component Value Date/Time   NA 136 08/17/2022 0815   NA 138 07/10/2018 1103   K 5.5 (H) 08/17/2022 0815   CL 101 08/17/2022 0815   CO2 22 08/17/2022 0815   GLUCOSE 133 (H) 08/17/2022 0815   BUN 34 (H) 08/17/2022 0815   BUN 17 07/10/2018 1103   CREATININE 2.66 (H) 08/17/2022 0815   CREATININE 2.03 (H) 07/06/2022 1145   CALCIUM 8.9 08/17/2022 0815   GFRNONAA 26 (L) 08/17/2022 0815   GFRNONAA 52 (L) 08/15/2017 0943   GFRAA 35 (L) 04/05/2019 0420   GFRAA 61 08/15/2017 0943    BNP    Component Value Date/Time   BNP 18.8 07/25/2022 0017     Imaging:  VAS [name] ABI WITH/WO TBI  Result Date: 09/05/2022  LOWER EXTREMITY DOPPLER STUDY Patient Name:  VERNIE KOSAK  Date of Exam:   09/05/2022 Medical Rec #: [id number]        Accession #:    [id number] Date of Birth: 1955/04/02        Patient Gender: M Patient Age:   9 years Exam Location:  [facility] Procedure:      VAS [name] ABI WITH/WO TBI Referring Phys: CALLIE GOODRICH --------------------------------------------------------------------------------  Indications: Claudication, and peripheral artery disease. Patient reports severe              leg pain when walking. He is s/p  bilateral SFA atherectomy and              angioplasty in 2018, but has not followed up for this since. He has              calluses on each foot that bother him when walking as well. High Risk Factors: Hypertension, hyperlipidemia, current smoker, coronary artery                    disease. Other Factors: CHF, A-fib, CKD stage III.  Vascular Interventions: L SFA mid atherectomy and angioplasty 4/18, R SFA                         atherectomy and angioplasty 6/18. Comparison Study: Previous ABIs performed 08/04/16 were 0.95 on the right and                   0.98 on the left. Performing Technologist: Olegario Shearer RVT  Examination Guidelines: A complete evaluation includes at minimum, Doppler waveform signals and systolic blood pressure reading at the level of bilateral brachial, anterior tibial, and posterior tibial arteries, when vessel segments are accessible. Bilateral testing is considered an integral part of a complete examination. Photoelectric Plethysmograph (PPG) waveforms and toe systolic pressure readings are included as required and additional duplex testing as needed. Limited examinations for reoccurring indications may be performed as noted.  ABI Findings: +---------+------------------+-----+----------+--------+ Right    Rt Pressure (mmHg)IndexWaveform  Comment  +---------+------------------+-----+----------+--------+ Brachial 200                                       +---------+------------------+-----+----------+--------+ PTA  163               0.82 monophasic         +---------+------------------+-----+----------+--------+ PERO     163               0.82 monophasic         +---------+------------------+-----+----------+--------+ DP       171               0.86 monophasic         +---------+------------------+-----+----------+--------+ Great Toe96                0.48 Abnormal           +---------+------------------+-----+----------+--------+  +---------+------------------+-----+----------+-------+ Left     Lt Pressure (mmHg)IndexWaveform  Comment +---------+------------------+-----+----------+-------+ Brachial 200                                      +---------+------------------+-----+----------+-------+ PTA      172               0.86 monophasic        +---------+------------------+-----+----------+-------+ PERO     163               0.82 monophasic        +---------+------------------+-----+----------+-------+ DP       153               0.76 monophasic        +---------+------------------+-----+----------+-------+ Great Toe104               0.52 Abnormal          +---------+------------------+-----+----------+-------+ +-------+-----------+-----------+------------+------------+ ABI/TBIToday's ABIToday's TBIPrevious ABIPrevious TBI +-------+-----------+-----------+------------+------------+ Right  0.86       0.48       0.95        0.62         +-------+-----------+-----------+------------+------------+ Left   0.86       0.52       0.98        0.45         +-------+-----------+-----------+------------+------------+  Bilateral ABIs appear decreased compared to prior study on 08/04/16.  Summary: Right: Resting right ankle-brachial index indicates mild right lower extremity arterial disease. The right toe-brachial index is abnormal. Left: Resting left ankle-brachial index indicates mild left lower extremity arterial disease. The left toe-brachial index is abnormal. *See table(s) above for measurements and observations. See arterial duplex report.  Electronically signed by Lance Muss MD on 09/05/2022 at 5:01:20 PM.    Final    VAS [name] LOWER EXTREMITY ARTERIAL DUPLEX  Result Date: 09/05/2022 LOWER EXTREMITY ARTERIAL DUPLEX STUDY Patient Name:  ZERICK BENCIVENGA  Date of Exam:   09/05/2022 Medical Rec #: [id number]        Accession #:    [id number] Date of Birth: February 27, 1955        Patient Gender: M  Patient Age:   35 years Exam Location:  [facility] Procedure:      VAS [name] LOWER EXTREMITY ARTERIAL DUPLEX Referring Phys: Marjie Skiff --------------------------------------------------------------------------------  Indications: Claudication, and peripheral artery disease. Patient reports severe              leg pain when walking. He is s/p bilateral SFA atherectomy and              angioplasty in 2018, but has not followed up for this since. He has  calluses on each foot that bother him when walking as well. High Risk Factors: Hypertension, hyperlipidemia, current smoker, coronary artery                    disease. Other Factors: CHF, A-fib, CKD stage III.  Vascular Interventions: L SFA mid atherectomy and angioplasty 4/18, R SFA                         atherectomy and angioplasty 6/18. Current ABI:            Right-0.86                         Left-0.86 Comparison Study: Previous right arterial duplex 08/04/16 showed distal SFA psv                   272 cm/sec. Previous left arterial duplex 06/19/16 showed left                   SFA origin psv of 344 cm/sec. Performing Technologist: Olegario Shearer RVT  Examination Guidelines: A complete evaluation includes B-mode imaging, spectral Doppler, color Doppler, and power Doppler as needed of all accessible portions of each vessel. Bilateral testing is considered an integral part of a complete examination. Limited examinations for reoccurring indications may be performed as noted.  +-----------+--------+-----+---------------+----------+------------------+ RIGHT      PSV cm/sRatioStenosis       Waveform  Comments           +-----------+--------+-----+---------------+----------+------------------+ CFA Prox   103                         triphasic                    +-----------+--------+-----+---------------+----------+------------------+ CFA Distal 57                          triphasic                     +-----------+--------+-----+---------------+----------+------------------+ DFA        75                          triphasic                    +-----------+--------+-----+---------------+----------+------------------+ SFA Prox   90                          monophasic                   +-----------+--------+-----+---------------+----------+------------------+ SFA Mid    193     2.5  50-74% stenosismonophasicbased on VR/plaque +-----------+--------+-----+---------------+----------+------------------+ SFA Distal 167                         monophasic                   +-----------+--------+-----+---------------+----------+------------------+ POP Prox   164          30-49% stenosismonophasic                   +-----------+--------+-----+---------------+----------+------------------+ POP Distal 36                                                       +-----------+--------+-----+---------------+----------+------------------+  TP Trunk   125                                                      +-----------+--------+-----+---------------+----------+------------------+ ATA Distal 0            occluded                                    +-----------+--------+-----+---------------+----------+------------------+ PTA Prox   0            occluded                                    +-----------+--------+-----+---------------+----------+------------------+ PTA Mid    0            occluded                                    +-----------+--------+-----+---------------+----------+------------------+ PTA Distal 0            occluded                                    +-----------+--------+-----+---------------+----------+------------------+ PERO Prox  210          50-74% stenosis                             +-----------+--------+-----+---------------+----------+------------------+ PERO Mid   56                          monophasic                    +-----------+--------+-----+---------------+----------+------------------+ PERO Distal80                          monophasic                   +-----------+--------+-----+---------------+----------+------------------+ A focal velocity elevation of 193 cm/s was obtained at mid SFA with post stenotic turbulence with a VR of 2.5. Findings are characteristic of 50-74% stenosis, based on VR and plaque. A 2nd focal velocity elevation was visualized, measuring 125 cm/s at TPT with a VR of 2.2. Findings are characteristic of 50-74% stenosis based on VR.  +-----------+--------+-----+---------------+----------+------------------------+ LEFT       PSV cm/sRatioStenosis       Waveform  Comments                 +-----------+--------+-----+---------------+----------+------------------------+ CFA Prox   57                          triphasic                          +-----------+--------+-----+---------------+----------+------------------------+ CFA Distal 60                          triphasic                          +-----------+--------+-----+---------------+----------+------------------------+  DFA        129                         triphasic plaque                   +-----------+--------+-----+---------------+----------+------------------------+ SFA Prox   69                          triphasic                          +-----------+--------+-----+---------------+----------+------------------------+ SFA Mid    245     2.8  50-74% stenosistriphasic plaque                   +-----------+--------+-----+---------------+----------+------------------------+ SFA Distal 439     3.0  50-74% stenosisstenotic  high-end of range based                                                   on PSV                   +-----------+--------+-----+---------------+----------+------------------------+ POP Prox   65                          monophasic                          +-----------+--------+-----+---------------+----------+------------------------+ POP Distal 33                          monophasic                         +-----------+--------+-----+---------------+----------+------------------------+ TP Trunk   75                          monophasic                         +-----------+--------+-----+---------------+----------+------------------------+ ATA Prox   0            occluded                                          +-----------+--------+-----+---------------+----------+------------------------+ ATA Distal 65                                                             +-----------+--------+-----+---------------+----------+------------------------+ PTA Distal 0            occluded                                          +-----------+--------+-----+---------------+----------+------------------------+ PERO Distal68  monophasic                         +-----------+--------+-----+---------------+----------+------------------------+ A focal velocity elevation of 245 cm/s was obtained at mid SFA with post stenotic turbulence with a VR of 2.8. Findings are characteristic of 50-74% stenosis. A 2nd focal velocity elevation was visualized, measuring 437 cm/s at distal SFA with post stenotic turbulence with a VR of 2.99. Findings are characteristic of 50-74% stenosis, high-end of range based on PSV.  Summary: Right: 50-74% stenosis noted in the superficial femoral artery. 30-49% stenosis noted in the popliteal artery. 50-74% stenosis of the tibioperoneal trunk artery. 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 Total occlusion noted in the posterior tibial artery. Left: 50-74% stenosis noted in the mid and distal superficial femoral artery (high-end of range in the distal SFA based on PSV). Total occlusion noted in the anterior tibial and posterior tibial arteries.  See table(s) above for measurements and  observations. See ABI report. Suggest Peripheral Vascular Consult if clinically indicated. Electronically signed by Lance Muss MD on 09/05/2022 at 4:58:00 PM.    Final    VAS US CAROTID  Result Date: 09/05/2022 Carotid Arterial Duplex Study Patient Name:  Kevin [name]  Date of Exam:   09/05/2022 Medical Rec #: [id number]        Accession #:    [id number] Date of Birth: 02-21-1955        Patient Gender: M Patient Age:   37 years Exam Location:  [facility] Procedure:      VAS US CAROTID Referring Phys: Marjie Skiff --------------------------------------------------------------------------------  Indications:   Visual disturbance.Patient reports he has had some transient                vision loss involving the left eye, where his vision 'went black'                for about 15 minutes and then returned. Risk Factors:  Hypertension, hyperlipidemia, current smoker, coronary artery                disease. Other Factors: CHF, A-fib, CKD stage III. Performing Technologist: Olegario Shearer RVT  Examination Guidelines: A complete evaluation includes B-mode imaging, spectral Doppler, color Doppler, and power Doppler as needed of all accessible portions of each vessel. Bilateral testing is considered an integral part of a complete examination. Limited examinations for reoccurring indications may be performed as noted.  Right Carotid Findings: +----------+--------+--------+--------+------------------+--------+           PSV cm/sEDV cm/sStenosisPlaque DescriptionComments +----------+--------+--------+--------+------------------+--------+ CCA Prox  80      17                                         +----------+--------+--------+--------+------------------+--------+ CCA Distal45      11                                         +----------+--------+--------+--------+------------------+--------+ ICA Prox  41      12              smooth                      +----------+--------+--------+--------+------------------+--------+ ICA Mid   58      22  1-39%                              +----------+--------+--------+--------+------------------+--------+ ICA Distal65      24                                         +----------+--------+--------+--------+------------------+--------+ ECA       81      12                                         +----------+--------+--------+--------+------------------+--------+ +----------+--------+-------+----------------+-------------------+           PSV cm/sEDV cmsDescribe        Arm Pressure (mmHG) +----------+--------+-------+----------------+-------------------+ [id number]            Multiphasic, WNL200                 +----------+--------+-------+----------------+-------------------+ +---------+--------+--+--------+--+---------+ VertebralPSV cm/s76EDV cm/s24Antegrade +---------+--------+--+--------+--+---------+  Left Carotid Findings: +----------+--------+--------+--------+------------------+------------------+           PSV cm/sEDV cm/sStenosisPlaque DescriptionComments           +----------+--------+--------+--------+------------------+------------------+ CCA Prox  85      18                                                   +----------+--------+--------+--------+------------------+------------------+ CCA Distal91      20                                intimal thickening +----------+--------+--------+--------+------------------+------------------+ ICA Prox  58      19              heterogenous                         +----------+--------+--------+--------+------------------+------------------+ ICA Mid   87      27      1-39%                                        +----------+--------+--------+--------+------------------+------------------+ ICA Distal65      25                                tortuous            +----------+--------+--------+--------+------------------+------------------+ ECA       77      9                                                    +----------+--------+--------+--------+------------------+------------------+ +----------+--------+--------+----------------+-------------------+           PSV cm/sEDV cm/sDescribe        Arm Pressure (mmHG) +----------+--------+--------+----------------+-------------------+ [id number]             Multiphasic, WNL200                 +----------+--------+--------+----------------+-------------------+ +---------+--------+--+--------+--+----------------------+  VertebralPSV cm/s26EDV cm/s11Antegrade and Atypical +---------+--------+--+--------+--+----------------------+   Summary: Right Carotid: Velocities in the right ICA are consistent with a 1-39% stenosis. Left Carotid: Velocities in the left ICA are consistent with a 1-39% stenosis. Vertebrals:  Bilateral vertebral arteries demonstrate antegrade flow, atypical              antegrade flow on the left. Subclavians: Normal flow hemodynamics were seen in bilateral subclavian              arteries. *See table(s) above for measurements and observations.  Electronically signed by Lance Muss MD on 09/05/2022 at 4:54:38 PM.    Final    DG Chest Port 1 View  Result Date: 08/17/2022 CLINICAL DATA:  Shortness of breath, hypertension.  Smoker. EXAM: PORTABLE CHEST 1 VIEW COMPARISON:  08/15/2022 FINDINGS: The heart size and mediastinal contours are within normal limits. Both lungs are clear. The visualized skeletal structures are unremarkable. IMPRESSION: No active disease. Electronically Signed   By: Signa Kell M.D.   On: 08/17/2022 08:52   DG Chest 2 View  Result Date: 08/15/2022 CLINICAL DATA:  COPD. EXAM: CHEST - 2 VIEW COMPARISON:  07/25/2022 FINDINGS: Heart size and mediastinal contours are normal. Mild central airway thickening with peribronchial cuffing. No pleural fluid,  interstitial edema or airspace disease. Visualized osseous structures are unremarkable. IMPRESSION: Mild central airway thickening with peribronchial cuffing. No focal airspace disease. Electronically Signed   By: Signa Kell M.D.   On: 08/15/2022 07:36    Administration History     None           No data to display          No results found for: "NITRICOXIDE"      Assessment & Plan:   COPD with chronic bronchitis and emphysema (HCC) Poorly controlled COPD without acute exacerbation. He has had trouble with recurrent exacerbations. Most recent prednisone course completed 7/10. He is clinically improved today. Has difficulties with chronic bronchitis and dyspnea symptoms. 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 We will step up his Trelegy to 200 mcg and add on singulair to target airway inflammation and trigger prevention. He will maximize mucociliary clearance therapies. Would prefer he quit smoking before addition of daliresp or chronic macrolide/steroid therapy. Smoking cessation advised. Action plan in place.  Patient Instructions  Continue Trelegy 1 puff daily. Brush tongue and rinse mouth afterwards Continue Albuterol inhaler 2 puffs or 3 mL neb every 6 hours as needed for shortness of breath or wheezing. Notify if symptoms persist despite rescue inhaler/neb use.   Singulair 1 tab At bedtime for COPD/allergies Guaifenesin 600 mg Twice daily for cough/congestion Use nebs twice a day then follow with flutter valve to help clear out phlegm   Will call you about your CT scan results   You need to quit smoking!   Restart noninvasive ventilator at night. You need to be wearing this every night, all night long. You have severe sleep apnea, which is likely contributing to your poor blood pressure control, fatigue, and restless sleep.  We discussed how untreated sleep apnea puts an individual at risk for cardiac arrhthymias, pulm HTN, DM,  stroke and increases their risk for daytime accidents.    Follow up in 6-8 weeks after full PFT with Dr. Wynona Neat to review PFTs and establish care. If symptoms do not improve or worsen, please contact office for sooner follow up or seek emergency care.    OSA (obstructive sleep apnea) Complex severe OSA. Poor control  on BiPAP due to central events. He has a ASV for which he underwent titration study in 2021 with optimal settings of SV Advance EPAP Min 7 and Max 7 cmH2O, Pressure Support Min 5 and Max 15 cmH2O with Max Pressure of 20 cmH2O and Breath Rate of Auto BrPM. Residual AHI on this was 1.5/h. He is not wearing this. Suspect that this is contributing to difficulties with BP control. Educated on risks of untreated OSA and correlation between this and his current health problems. He was agreeable to resuming therapy. Will bring SD card with him to follow up. Understands proper use/care of device. Daughter helps him with cleaning. Cautioned on safe driving practices.   Essential hypertension Per the patient, BP today is at his baseline. Advised him to contact cardiology. No red flag symptoms. See above   I spent 45 minutes of dedicated to the care of this patient on the date of this encounter to include pre-visit review of records, face-to-face time with the patient discussing conditions above, post visit ordering of testing, clinical documentation with the electronic health record, making appropriate referrals as documented, and communicating necessary findings to members of the patients care team.  [name], NP 09/08/2022  Pt aware and understands NP's role.

## 2022-09-08 NOTE — Assessment & Plan Note (Signed)
Complex severe OSA. Poor control on BiPAP due to central events. He has a ASV for which he underwent titration study in 2021 with optimal settings of SV Advance EPAP Min 7 and Max 7 cmH2O, Pressure Support Min 5 and Max 15 cmH2O with Max Pressure of 20 cmH2O and Breath Rate of Auto BrPM. Residual AHI on this was 1.5/h. He is not wearing this. Suspect that this is contributing to difficulties with BP control. Educated on risks of untreated OSA and correlation between this and his current health problems. He was agreeable to resuming therapy. Will bring SD card with him to follow up. Understands proper use/care of device. Daughter helps him with cleaning. Cautioned on safe driving practices.

## 2022-09-11 ENCOUNTER — Emergency Department (HOSPITAL_COMMUNITY)
Admission: EM | Admit: 2022-09-11 | Discharge: 2022-09-11 | Disposition: A | Discharge status: Home / Self Care | Payer: Medicaid Other | Source: Home / Self Care | Attending: Emergency Medicine | Admitting: Emergency Medicine

## 2022-09-11 ENCOUNTER — Other Ambulatory Visit: Payer: Self-pay

## 2022-09-11 ENCOUNTER — Ambulatory Visit: Payer: Medicaid Other | Admitting: Family Medicine

## 2022-09-11 ENCOUNTER — Encounter (HOSPITAL_COMMUNITY): Payer: Self-pay

## 2022-09-11 ENCOUNTER — Telehealth: Payer: Self-pay | Admitting: Nurse Practitioner

## 2022-09-11 ENCOUNTER — Other Ambulatory Visit: Payer: Self-pay | Admitting: Nurse Practitioner

## 2022-09-11 ENCOUNTER — Emergency Department (HOSPITAL_COMMUNITY): Payer: Medicaid Other

## 2022-09-11 ENCOUNTER — Telehealth: Payer: Self-pay | Admitting: Family Medicine

## 2022-09-11 DIAGNOSIS — I509 Heart failure, unspecified: Secondary | ICD-10-CM | POA: Diagnosis not present

## 2022-09-11 DIAGNOSIS — Z7982 Long term (current) use of aspirin: Secondary | ICD-10-CM | POA: Diagnosis not present

## 2022-09-11 DIAGNOSIS — R7989 Other specified abnormal findings of blood chemistry: Secondary | ICD-10-CM | POA: Insufficient documentation

## 2022-09-11 DIAGNOSIS — R911 Solitary pulmonary nodule: Secondary | ICD-10-CM | POA: Insufficient documentation

## 2022-09-11 DIAGNOSIS — J441 Chronic obstructive pulmonary disease with (acute) exacerbation: Secondary | ICD-10-CM | POA: Diagnosis not present

## 2022-09-11 DIAGNOSIS — I130 Hypertensive heart and chronic kidney disease with heart failure and stage 1 through stage 4 chronic kidney disease, or unspecified chronic kidney disease: Secondary | ICD-10-CM | POA: Insufficient documentation

## 2022-09-11 DIAGNOSIS — N189 Chronic kidney disease, unspecified: Secondary | ICD-10-CM | POA: Insufficient documentation

## 2022-09-11 DIAGNOSIS — Z79899 Other long term (current) drug therapy: Secondary | ICD-10-CM | POA: Diagnosis not present

## 2022-09-11 DIAGNOSIS — Z1152 Encounter for screening for COVID-19: Secondary | ICD-10-CM | POA: Diagnosis not present

## 2022-09-11 DIAGNOSIS — Z21 Asymptomatic human immunodeficiency virus [HIV] infection status: Secondary | ICD-10-CM | POA: Insufficient documentation

## 2022-09-11 DIAGNOSIS — Z7951 Long term (current) use of inhaled steroids: Secondary | ICD-10-CM | POA: Diagnosis not present

## 2022-09-11 DIAGNOSIS — D631 Anemia in chronic kidney disease: Secondary | ICD-10-CM | POA: Diagnosis not present

## 2022-09-11 DIAGNOSIS — R0602 Shortness of breath: Secondary | ICD-10-CM | POA: Diagnosis present

## 2022-09-11 LAB — COMPREHENSIVE METABOLIC PANEL
ALT: 10 U/L (ref 0–44)
AST: 13 U/L — ABNORMAL LOW (ref 15–41)
Albumin: 3.1 g/dL — ABNORMAL LOW (ref 3.5–5.0)
Alkaline Phosphatase: 69 U/L (ref 38–126)
Anion gap: 9 (ref 5–15)
BUN: 21 mg/dL (ref 8–23)
CO2: 18 mmol/L — ABNORMAL LOW (ref 22–32)
Calcium: 8.5 mg/dL — ABNORMAL LOW (ref 8.9–10.3)
Chloride: 110 mmol/L (ref 98–111)
Creatinine, Ser: 2.14 mg/dL — ABNORMAL HIGH (ref 0.61–1.24)
GFR, Estimated: 34 mL/min — ABNORMAL LOW (ref >60)
Glucose, Bld: 92 mg/dL (ref 70–99)
Potassium: 4.2 mmol/L (ref 3.5–5.1)
Sodium: 137 mmol/L (ref 135–145)
Total Bilirubin: 0.5 mg/dL (ref 0.3–1.2)
Total Protein: 6.9 g/dL (ref 6.5–8.1)

## 2022-09-11 LAB — CBC
HCT: 33 % — ABNORMAL LOW (ref 39.0–52.0)
Hemoglobin: 10.8 g/dL — ABNORMAL LOW (ref 13.0–17.0)
MCH: 32.6 pg (ref 26.0–34.0)
MCHC: 32.7 g/dL (ref 30.0–36.0)
MCV: 99.7 fL (ref 80.0–100.0)
Platelets: 255 10*3/uL (ref 150–400)
RBC: 3.31 MIL/uL — ABNORMAL LOW (ref 4.22–5.81)
RDW: 15.4 % (ref 11.5–15.5)
WBC: 6.1 10*3/uL (ref 4.0–10.5)
nRBC: 0 % (ref 0.0–0.2)

## 2022-09-11 LAB — RESP PANEL BY RT-PCR (RSV, FLU A&B, COVID)  RVPGX2
Influenza A by PCR: NEGATIVE
Influenza B by PCR: NEGATIVE
Resp Syncytial Virus by PCR: NEGATIVE
SARS Coronavirus 2 by RT PCR: NEGATIVE

## 2022-09-11 MED ORDER — DOXYCYCLINE HYCLATE 100 MG PO CAPS: 100.0000 mg | ORAL_CAPSULE | Freq: Two times a day (BID) | ORAL | 0 refills | Status: AC

## 2022-09-11 MED ORDER — PREDNISONE 20 MG PO TABS: 20.0000 mg | ORAL_TABLET | Freq: Every day | ORAL | 0 refills | Status: DC

## 2022-09-11 MED ORDER — AMLODIPINE BESYLATE 5 MG PO TABS
10.0000 mg | ORAL_TABLET | Freq: Once | ORAL | Status: AC
  Administered 2022-09-11: 10 mg via ORAL
  Filled 2022-09-11: qty 2

## 2022-09-11 MED ORDER — HYDRALAZINE HCL 25 MG PO TABS
25.0000 mg | ORAL_TABLET | Freq: Once | ORAL | Status: AC
  Administered 2022-09-11: 25 mg via ORAL
  Filled 2022-09-11: qty 1

## 2022-09-11 MED ORDER — CARVEDILOL 3.125 MG PO TABS: 6.2500 mg | ORAL_TABLET | Freq: Two times a day (BID) | ORAL | Status: DC

## 2022-09-11 NOTE — Discharge Instructions (Addendum)
It was a pleasure caring for you today. Lab workup was reassuring. Your EKG had changes on it that you will need to follow up with your cardiologist. Seek emergency care if experiencing any new or worsening symptoms.

## 2022-09-11 NOTE — ED Notes (Signed)
Pt requesting to be discharged, PA notified

## 2022-09-11 NOTE — ED Provider Notes (Incomplete)
67 yo male ho copd and chf with increasing dyspnea since loc. Increased cough x several days No fever or chills Daily albuterol and neb  No edema  DDX: COPD CHF PE CAD - anginal equivalent Pneumonia

## 2022-09-11 NOTE — ED Notes (Addendum)
Pt ambulated while having O2 statuation monitored. The pt did 4 laps around the nurses station. The pts  os stat remained between 98% and 95% the majority of the time only dropped to 94% twice with a poor wave pleth. The pt talk through the most of the walked and stated that he was not having any is discomfort of SOB.

## 2022-09-11 NOTE — Telephone Encounter (Signed)
Pt was a no show for his OV with Dr Janee Morn on 09/11/22, I did not send a letter. He was in the ED.

## 2022-09-11 NOTE — ED Triage Notes (Signed)
Patient BIB [facility] from home due to SOB. Patient has hx asthma and COPD. EMS noted expiratory wheezes. Patient was given 2 duo nebs initially and felt better, after trying to walk patient became very SOB. Patient then given 1 albuterol, 0.3 epi, 2g magnesium, and 125 solumedrol. Patient A&Ox4.

## 2022-09-11 NOTE — Progress Notes (Signed)
Please let patient know I am ordering a PET scan for further evaluation of his nodules. There are two that have grown in size compared to his last scan. Thanks.

## 2022-09-11 NOTE — ED Provider Notes (Signed)
[place] EMERGENCY DEPARTMENT AT [facility] Provider Note   CSN: [id number] Arrival date & time: 09/11/22  4098     History  Chief Complaint  Patient presents with   Shortness of Breath    Kevin [name] is a 67 y.o. male PMHx COPD, asthma, CKD, HTN, CHF, HIV who presents to ED concerned for SOB since last night. EMS gave patient breathing treatment in home and patient felt better until he got up and walked. Patient also endorses dry cough that has increased from his baseline. Patient endorses compliance on HIV medication. Endorses a "couple" of cigarettes over the past week. Denies any other drug use.  Denies fever, chest pain, abdominal pain, nausea, vomiting, diarrhea. Denies hemoptysis, immobilization, surgeries, calf tenderness, hx DVT/PE.   Shortness of Breath      Home Medications Prior to Admission medications   Medication Sig Start Date End Date Taking? Authorizing Provider  acetylcysteine (MUCOMYST) 10 % nebulizer solution Take 4 mLs by nebulization every 4 (four) hours. 08/18/22   [name], MD  albuterol (VENTOLIN HFA) 108 (90 Base) MCG/ACT inhaler Inhale 2 puffs into the lungs every 4 (four) hours as needed for wheezing or shortness of breath. 07/25/22   Tilden Fossa, MD  amLODipine (NORVASC) 10 MG tablet Take 1 tablet (10 mg total) by mouth daily. 08/16/22   Corrin Parker, PA-C  aspirin EC 81 MG tablet Take 1 tablet (81 mg total) by mouth daily. Swallow whole. Patient taking differently: Take 162 mg by mouth every 6 (six) hours as needed for mild pain. Swallow whole. 04/28/22   [name], MD  atorvastatin (LIPITOR) 40 MG tablet Take 1 tablet (40 mg total) by mouth daily. 04/28/22   [name], MD  benzonatate (TESSALON) 200 MG capsule Take 1 capsule (200 mg total) by mouth 3 (three) times daily as needed for cough. Patient not taking: Reported on 09/08/2022 07/26/22   [name], NP  carvedilol (COREG) 6.25 MG tablet Take  6.25 mg by mouth 2 (two) times daily with a meal.    [provider]  dextromethorphan-guaiFENesin (MUCINEX DM) 30-600 MG 12hr tablet Take 1 tablet by mouth 2 (two) times daily. 08/08/22 08/03/23  [name], MD  dolutegravir-lamiVUDine (DOVATO) 50-300 MG tablet Take 1 tablet by mouth daily. 07/06/22   Randall Hiss, MD  doxycycline (VIBRAMYCIN) 100 MG capsule Take 1 capsule (100 mg total) by mouth 2 (two) times daily. 07/25/22   Tilden Fossa, MD  Fluticasone-Umeclidin-Vilant (TRELEGY ELLIPTA) 200-62.5-25 MCG/ACT AEPB Inhale 1 puff into the lungs daily. 09/08/22   Cobb, Ruby Cola, NP  guaiFENesin-dextromethorphan (ROBITUSSIN DM) 100-10 MG/5ML syrup Take 5 mLs by mouth every 6 (six) hours as needed for cough. Patient not taking: Reported on 09/08/2022 07/26/22   [name], NP  hydrALAZINE (APRESOLINE) 25 MG tablet Take 1 tablet (25 mg total) by mouth 3 (three) times daily. 08/16/22 11/14/22  Marjie Skiff E, PA-C  ipratropium-albuterol (DUONEB) 0.5-2.5 (3) MG/3ML SOLN Take 3 mLs by nebulization every 6 (six) hours as needed (wheezing or shortness of breath). 08/17/22   Gwyneth Sprout, MD  metoprolol tartrate (LOPRESSOR) 25 MG tablet Take 1 tablet (25 mg total) by mouth 2 (two) times daily. 08/16/22 11/14/22  Marjie Skiff E, PA-C  montelukast (SINGULAIR) 10 MG tablet Take 1 tablet (10 mg total) by mouth at bedtime. 09/08/22   Cobb, Ruby Cola, NP  nicotine (NICODERM CQ - DOSED IN MG/24 HOURS) 14 mg/24hr patch Place  1 patch (14 mg total) onto the skin daily. 07/26/22   Cobb, Ruby Cola, NP  oxyCODONE-acetaminophen (PERCOCET) 7.5-325 MG tablet Take 1 tablet by mouth 3 (three) times daily. 06/22/22   [provider]  predniSONE (DELTASONE) 20 MG tablet Take 2 tablets (40 mg total) by mouth daily. Take 40mg  (2 tabs) for 2 days, then take 20mg  (1tab) for 2 days and then take 10mg  (0.5 tab) po for 2 days 08/17/22   Gwyneth Sprout, MD  valsartan (DIOVAN) 320 MG tablet Take  320 mg by mouth daily. Patient not taking: Reported on 09/08/2022    [provider]      Allergies    Patient has no known allergies.    Review of Systems   Review of Systems  Respiratory:  Positive for shortness of breath.     Physical Exam Updated Vital Signs BP (!) 176/89   Pulse 71   Temp 97.8 F (36.6 C) (Oral)   Resp 12   Ht 6' (1.829 m)   Wt 73.5 kg   SpO2 97%   BMI 21.97 kg/m  Physical Exam Vitals and nursing note reviewed.  Constitutional:      General: He is not in acute distress.    Appearance: He is not ill-appearing, toxic-appearing or diaphoretic.  HENT:     Head: Normocephalic and atraumatic.     Mouth/Throat:     Mouth: Mucous membranes are moist.     Pharynx: No posterior oropharyngeal erythema.  Eyes:     General: No scleral icterus.       Right eye: No discharge.        Left eye: No discharge.     Conjunctiva/sclera: Conjunctivae normal.  Cardiovascular:     Rate and Rhythm: Normal rate and regular rhythm.     Pulses: Normal pulses.     Heart sounds: Normal heart sounds. No murmur heard. Pulmonary:     Effort: Pulmonary effort is normal. No respiratory distress.     Breath sounds: No wheezing, rhonchi or rales.     Comments: Diminished breath sounds. No wheezing/rhonchi/rales Abdominal:     Palpations: Abdomen is soft.  Musculoskeletal:     Right lower leg: No edema.     Left lower leg: No edema.     Comments: +2 radial and pedal pulses. No calf tenderness. No pitting edema.  Skin:    General: Skin is warm and dry.     Capillary Refill: Capillary refill takes less than 2 seconds.     Findings: No rash.  Neurological:     General: No focal deficit present.     Mental Status: He is alert and oriented to person, place, and time. Mental status is at baseline.  Psychiatric:        Mood and Affect: Mood normal.        Behavior: Behavior normal.     ED Results / Procedures / Treatments   Labs (all labs ordered are listed, but  only abnormal results are displayed) Labs Reviewed  COMPREHENSIVE METABOLIC PANEL - Abnormal; Notable for the following components:      Result Value   CO2 18 (*)    Creatinine, Ser 2.14 (*)    Calcium 8.5 (*)    Albumin 3.1 (*)    AST 13 (*)    GFR, Estimated 34 (*)    All other components within normal limits  CBC - Abnormal; Notable for the following components:   RBC 3.31 (*)    Hemoglobin  10.8 (*)    HCT 33.0 (*)    All other components within normal limits  RESP PANEL BY RT-PCR (RSV, FLU A&B, COVID)  [id number]  TROPONIN I (HIGH SENSITIVITY)    EKG EKG Interpretation Date/Time:  Monday September 11 2022 07:58:07 EDT Ventricular Rate:  51 PR Interval:  198 QRS Duration:  88 QT Interval:  452 QTC Calculation: 417 R Axis:   77  Text Interpretation: Sinus rhythm Anteroseptal infarct, old Repol abnrm, global ischemia, diffuse leads Minimal ST elevation, lateral leads Confirmed by Margarita Grizzle [phone]) on 09/11/2022 10:42:14 AM  Radiology DG Chest 2 View  Result Date: 09/11/2022 CLINICAL DATA:  67 year old male with shortness of breath. COPD. Expiratory wheezes. EXAM: CHEST - 2 VIEW COMPARISON:  Recent chest CT 09/06/2022 and earlier. FINDINGS: PA and lateral views at 0706 hours. Lung volumes are larger since 2017 and centrilobular emphysema demonstrated on recent CT. Mediastinal contours remain normal. Visualized tracheal air column is within normal limits. No pneumothorax, pulmonary edema, pleural effusion, or consolidation. Multiple right lung nodules, some enlarged on recent CT, are radiographically occult. No acute osseous abnormality identified. Negative visible bowel gas. IMPRESSION: 1. See recent Chest CT 09/06/2022 report regarding abnormal and increased right lung nodules. 2.  Emphysema (ICD10-J43.9). No new cardiopulmonary abnormality. Electronically Signed   By: Odessa Fleming M.D.   On: 09/11/2022 07:50    Procedures Procedures    Medications Ordered in ED Medications   carvedilol (COREG) tablet 6.25 mg (has no administration in time range)  hydrALAZINE (APRESOLINE) tablet 25 mg (25 mg Oral Given 09/11/22 0900)  amLODipine (NORVASC) tablet 10 mg (10 mg Oral Given 09/11/22 0900)    ED Course/ Medical Decision Making/ A&P                             Medical Decision Making Amount and/or Complexity of Data Reviewed Labs: ordered. Radiology: ordered.  Risk Prescription drug management.   This patient presents to the ED for concern of shortness of breath, this involves an extensive number of treatment options, and is a complaint that carries with it a high risk of complications and morbidity.  The differential diagnosis includes Anxiety, Anaphylaxis/Angioedema, Aspirated FB, Arrhythmia, CHF, Asthma, COPD, PNA, COVID/Flu/RSV, STEMI, Tamponade, TPNX, DKA, Sepsis, Toxin   Co morbidities that complicate the patient evaluation  HIV, CKD, COPD, HTN, PAD, CHF   Additional history obtained:  7/26 Pulmonologist Note Patient seen in outpatient clinic who increased dosage of Trelegy to and prescribed Singulair. Patient states that he has not yet picked up Singulair d/t cost.   Lab Tests:  I Ordered, and personally interpreted labs.  The pertinent results include:   - BMP: elevated Cr which is lower than last value (2.66 -> 2.14) - CBC: No leukocytosis; mild anemia - Respiratory Panel: negative   Imaging Studies ordered:  I ordered imaging studies including  -chest xray: Assess for process contributing to patient's symptoms I independently visualized and interpreted imaging I agree with the radiologist interpretation   Cardiac Monitoring: / EKG:  The patient was maintained on a cardiac monitor.  I personally viewed and interpreted the cardiac monitored which showed an underlying rhythm of: Sinus rhythm without acute ST changes or arrhythmias   Problem List / ED Course / Critical interventions / Medication management  Presents to ED  concern for SOB since last night.  EMS provided patient with albuterol, epi, magnesium, and Solu-Medrol.  Patient stating that his symptoms  have improved since receiving those medications, but states that the SOB returns when he gets up to use the bathroom.  Physical exam unremarkable. No tachycardia or tachypnea. Patient with O2 sat 94%-98% on RA while ambulating.  Chest x-ray without acute cardiopulmonary disease. Respiratory panel negative.  CMP with elevated creatinine and decreased GFR which is improved from his last values 3 weeks ago - which is reassuring.  CBC with mild anemia.  No leukocytosis. Patient and family member concerned about new pulmonary nodule seen on outpatient CT and requesting to see their pulmonologist in the ED and obtain PET scan in the ED. I educated patient and family that PET scan is an outpatient imaging that we cannot provide in ED. Talked to patient's Pulmonologist who stated that patient has a PET scan scheduled in 2 weeks and follow up with oncologist in August. Pulmonologist also increased patient's home medications but patient has not yet picked up these medications. Educated patient that he needs to obtain prescriptions.  EKG with ST depression changes in leads II and III. This finding was present 1 month ago but more pronounced today. Patient denying chest pain/nausea/vomiting. Talked with Cardiology who recommended obtaining troponins. Upon sharing this information with the patient - patient declined troponins and requested discharge. I educated patient the risks of not undergoing further cardiac workup, patient stating that he still wants discharge and can follow up with cardiologist. Provided patient with some of his home HTN medications given that he was hypertensive in ED. Patient declining concerning symptoms of HTN such as headache, chest pain, abdominal pain. Prescribed patient with prednisone and doxy for COPD exacerbation. Provided with 20mg  Prednisone given that  patient states that he is about to pick up new prescription prescribed by his Pulmonologist that has increased dose of steroids from past prescription. I have reviewed the patients home medicines and have made adjustments as needed Patient was given return precautions. Patient stable for discharge at this time. Patient verbalized understanding of plan.  DDx: These are considered less likely due to history of present illness and physical exam findings Aspirated FB: no history of choking Arrhythmia: EKG without concern STEMI: EKG reassuring Tamponade: Chest xray without concern CHF: no physical exam findings TPNX: Lungs clear to auscultation bilaterally DKA: BG within normal limits and without anion gap Sepsis: afebrile and other vital signs stable  Risk Stratification Score:  Wells DVT: 0    Social Determinants of Health:  none          Final Clinical Impression(s) / ED Diagnoses Final diagnoses:  COPD exacerbation (HCC)  Lung nodule seen on imaging study    Rx / DC Orders ED Discharge Orders     None         Dorthy Cooler, [place] 09/11/22 1441    Margarita Grizzle, MD 09/13/22 1242

## 2022-09-11 NOTE — Telephone Encounter (Signed)
Pt. Daughter calling and has gotten father's CT scan back and needs order placed for pet scan but is currently in [facility]

## 2022-09-13 ENCOUNTER — Other Ambulatory Visit: Payer: Self-pay

## 2022-09-14 ENCOUNTER — Other Ambulatory Visit: Payer: Medicaid Other

## 2022-09-14 NOTE — Progress Notes (Signed)
   09/14/2022  Patient ID: Kevin [name], male   DOB: 16-Jun-1955, 67 y.o.   MRN: [id number]  S/O Telephone visit in response to referral from PCP, Fanny Bien, for medication review specific to hypertension control  Medication Management -Reviewed and updated patient's current medication list -Patient is out of nebulizer solutions (Acetylcysteine and Duonebs); he is also requesting a new nebulizer, stating his is no longer working well -He is also out of Valsartan 320mg  tablets -Patient endorses good adherence to medications with no affordability or transportation barriers  HTN -Current medications:  valsartan 320mg  daily, metoprolol 25mg  BID, amlodipine 10mg  daily, hydralazine 25mg  TID -Patient states he is also taking carvedilol 6.25mg  BID -He does check BP at home and states it continues to run high- value provided was "200 over something" -Patient sees Jari Favre with cardiology tomorrow and states he has an upcoming appointment with hypertension clinic -Disease state likely complicated by OSA and other co morbidities   A/P  Medication Management -Contacted [facility] and both nebulizer solutions had refills they were able to process- they state these will be ready for pick up Saturday after 8am.  I will notify patient. -Pending refill order for valsartan 320mg  daily for cardiology provider to sign if in agreement -Patient has an upcoming appointment with pulmonology- messaging that provider to inform that he is needing a new nebulizer  HTN -HTN is uncontrolled -Per Dr. Carollee Massed most recent note, patient was to stop carvedilol due to duplicate therapy with metoprolol.  I will inform him of this. -Continue current regimen until seen by cardiology tomorrow.  Hydralazine could be increased or chlorthalidone therapy could be considered.  Follow-up:  None scheduled but can as needed per PCP  Lenna Gilford, PharmD, DPLA

## 2022-09-15 ENCOUNTER — Encounter: Payer: Self-pay | Admitting: Physician Assistant

## 2022-09-15 ENCOUNTER — Ambulatory Visit: Payer: Medicaid Other | Attending: Physician Assistant | Admitting: Physician Assistant

## 2022-09-15 ENCOUNTER — Ambulatory Visit: Payer: Medicaid Other | Admitting: Family Medicine

## 2022-09-15 VITALS — BP 160/94 | HR 67 | Ht 72.0 in | Wt 175.6 lb

## 2022-09-15 DIAGNOSIS — I10 Essential (primary) hypertension: Secondary | ICD-10-CM | POA: Diagnosis not present

## 2022-09-15 DIAGNOSIS — G4733 Obstructive sleep apnea (adult) (pediatric): Secondary | ICD-10-CM

## 2022-09-15 DIAGNOSIS — Z720 Tobacco use: Secondary | ICD-10-CM

## 2022-09-15 DIAGNOSIS — R0609 Other forms of dyspnea: Secondary | ICD-10-CM

## 2022-09-15 DIAGNOSIS — I739 Peripheral vascular disease, unspecified: Secondary | ICD-10-CM

## 2022-09-15 DIAGNOSIS — N183 Chronic kidney disease, stage 3 unspecified: Secondary | ICD-10-CM | POA: Diagnosis not present

## 2022-09-15 DIAGNOSIS — J449 Chronic obstructive pulmonary disease, unspecified: Secondary | ICD-10-CM

## 2022-09-15 MED ORDER — HYDRALAZINE HCL 50 MG PO TABS: 50.0000 mg | ORAL_TABLET | Freq: Three times a day (TID) | ORAL | 3 refills | Status: DC

## 2022-09-15 NOTE — Progress Notes (Signed)
Cardiology Office Note:  .   Date:  09/15/2022  ID:  Kevin [name], DOB 1955-06-02, MRN [id number] PCP: [name], MD  [facility] Providers Cardiologist:  Chrystie Nose, MD {  History of Present Illness: .   Kevin [name] is a 67 y.o. male with a past medical history of hyperlipidemia, HIV, COPD, OSA on CPAP, hypertension, dyspnea on exertion who presents today for follow-up appointment.  Seen by Dr. Rennis Golden 04/29/2021 with worsening DOE, headache, new visual changes.  Was found to have hypertensive emergency with blood pressures 170s to 180/120 systolic.  Noted to have elevated creatinine as well.  Denied chest pain.  Was in A-fib, rate controlled.  He had significant LVH on EKG as well as T wave inversions in the inferior leads.  Initial troponin was 0.02.  Chest x-ray showed peribronchial thickening and hyperinflation concerning for COPD.  BNP was mildly elevated at 208.  Was given Lasix and diuresed a significant amount in the ED.  Stress testing February 2018 showed an LVEF of 47% with a medium sized moderate severity inferior defect suggesting inferior MI.  Patient was not of aware of prior heart attack.  Some chronic lung disease likely COPD but was on a number of inhalers and reports they were not helping with the shortness of breath.  Also was having fatigue at that time.  Describes chest pain as well.  Despite findings on the stress test, suspect this was representing progressive coronary artery disease.  Lipid testing was done December 2022 which showed total cholesterol 196 and LDL 118, not at goal.  Goal LDL would be less than 70.  Patient also seen by Dr. Tresa Endo for ongoing management of sleep apnea with BiPAP.  Today, he states that he was recently in the ED and received a steroid shot and 2 breathing treatments.  It helped a little bit.  Continues to smoke 2 to 3 cigarettes a day, sometimes not at all.  He saw Marjie Skiff, PA-C in the hospital.  At some point  was found to have a lung nodule and referred to Dr. Delton Coombes.  He never got his DuoNebs or nebulizer which is being sorted.  He was very unhappy with the care that he received in the hospital.  Felt like nobody cared about his wellbeing or health.  Ultimately, left AMA.  Still having lots of DOE with activity.  Troponins were never drawn in the hospital.  EKG did show some changes thought to be ischemic.  Reports no chest pain, pressure, or tightness. No edema, orthopnea, PND. Reports no palpitations.   ROS: ROS in HPI  Studies Reviewed: .       Echocardiogram 12/2021 IMPRESSIONS     1. Left ventricular ejection fraction, by estimation, is 60 to 65%. The  left ventricle has normal function. The left ventricle has no regional  wall motion abnormalities. There is moderate concentric left ventricular  hypertrophy. Left ventricular  diastolic parameters are consistent with Grade I diastolic dysfunction  (impaired relaxation).   2. Right ventricular systolic function is normal. The right ventricular  size is normal. There is mildly elevated pulmonary artery systolic  pressure.   3. Left atrial size was mildly dilated.   4. The mitral valve is normal in structure. No evidence of mitral valve  regurgitation. No evidence of mitral stenosis.   5. The aortic valve is grossly normal. Aortic valve regurgitation is not  visualized. No aortic stenosis is present.   6. The  inferior vena cava is normal in size with greater than 50%  respiratory variability, suggesting right atrial pressure of 3 mmHg.   Comparison(s): No significant change from prior study.   FINDINGS   Left Ventricle: Left ventricular ejection fraction, by estimation, is 60  to 65%. The left ventricle has normal function. The left ventricle has no  regional wall motion abnormalities. The left ventricular internal cavity  size was normal in size. There is   moderate concentric left ventricular hypertrophy. Left ventricular   diastolic parameters are consistent with Grade I diastolic dysfunction  (impaired relaxation).   Right Ventricle: The right ventricular size is normal. No increase in  right ventricular wall thickness. Right ventricular systolic function is  normal. There is mildly elevated pulmonary artery systolic pressure. The  tricuspid regurgitant velocity is 2.99   m/s, and with an assumed right atrial pressure of 3 mmHg, the estimated  right ventricular systolic pressure is 38.8 mmHg.   Left Atrium: Left atrial size was mildly dilated.   Right Atrium: Right atrial size was normal in size.   Pericardium: There is no evidence of pericardial effusion.   Mitral Valve: The mitral valve is normal in structure. No evidence of  mitral valve regurgitation. No evidence of mitral valve stenosis.   Tricuspid Valve: The tricuspid valve is normal in structure. Tricuspid  valve regurgitation is trivial. No evidence of tricuspid stenosis.   Aortic Valve: The aortic valve is grossly normal. Aortic valve  regurgitation is not visualized. No aortic stenosis is present. Aortic  valve mean gradient measures 4.0 mmHg. Aortic valve peak gradient measures  10.5 mmHg. Aortic valve area, by VTI measures   4.40 cm.   Pulmonic Valve: The pulmonic valve was not well visualized. Pulmonic valve  regurgitation is not visualized. No evidence of pulmonic stenosis.   Aorta: The aortic root and ascending aorta are structurally normal, with  no evidence of dilitation.   Venous: The inferior vena cava is normal in size with greater than 50%  respiratory variability, suggesting right atrial pressure of 3 mmHg.   IAS/Shunts: The atrial septum is grossly normal.      Physical Exam:   VS:  BP (!) 160/94   Pulse 67   Ht 6' (1.829 m)   Wt 175 lb 9.6 oz (79.7 kg)   SpO2 97%   BMI 23.82 kg/m    Wt Readings from Last 3 Encounters:  09/15/22 175 lb 9.6 oz (79.7 kg)  09/11/22 162 lb (73.5 kg)  09/08/22 161 lb 6.4 oz (73.2  kg)    GEN: Well nourished, well developed in no acute distress NECK: No JVD; No carotid bruits CARDIAC: RRR, no murmurs, rubs, gallops RESPIRATORY:  Clear to auscultation without rales, wheezing or rhonchi  ABDOMEN: Soft, non-tender, non-distended EXTREMITIES:  No edema; No deformity   ASSESSMENT AND PLAN: .   1 DOE -He is seeing Dr. Delton Coombes in about 2 weeks for an initial consultation -Continue current nebulizer and inhaled treatments -We ordered a Lexiscan Myoview today due to EKG changes found and he was in the ER  2 PAD -Status post left SFA intervention (07/2016) -No new leg pain noted today -Would recommend follow-up with vascular   3.  HTN -Uncontrolled today, 160/94 -Would continue amlodipine 10 mg daily, increase hydralazine to 50 mg 3 times daily, continue Lopressor 25 mg twice a day, and Diovan 320 mg daily  4.  COPD   -This is the culprit of a lot of his symptoms -Would keep  his follow-up with pulmonary for maximization of medications -Recommended stopping smoking altogether (even though patient has significantly decreased over the years) -Pulmonary rehab might be an option at some point Informed Consent   Shared Decision Making/Informed Consent{ All outpatient stress tests require an informed consent ([id number]) ATTESTATION ORDER The risks [chest pain, shortness of breath, cardiac arrhythmias, dizziness, blood pressure fluctuations, myocardial infarction, stroke/transient ischemic attack, and life-threatening complications (estimated to be 1 in 10,000)], benefits (risk stratification, diagnosing coronary artery disease, treatment guidance) and alternatives of an Lexiscan Myoview were discussed in detail with Mr. Hakim and he agrees to proceed.     Dispo: He can follow-up in 6 weeks with me to review testing  Signed, Sharlene Dory, PA-C

## 2022-09-15 NOTE — Telephone Encounter (Signed)
PET scan looks to be scheduled.

## 2022-09-15 NOTE — Patient Instructions (Addendum)
Medication Instructions:  Your physician has recommended you make the following change in your medication:  1- Increase Hydralazine 50 mg by mouth three times a day.  *If you need a refill on your cardiac medications before your next appointment, please call your pharmacy*  Lab Work: If you have labs (blood work) drawn today and your tests are completely normal, you will receive your results only by: MyChart Message (if you have MyChart) OR A paper copy in the mail If you have any lab test that is abnormal or we need to change your treatment, we will call you to review the results.  Testing/Procedures: Your physician has requested that you have a lexiscan myoview. For further information please visit [email or web address] Please follow instruction sheet, as given.  Follow-Up: At [facility], you and your health needs are our priority.  As part of our continuing mission to provide you with exceptional heart care, we have created designated Provider Care Teams.  These Care Teams include your primary Cardiologist (physician) and Advanced Practice Providers (APPs -  Physician Assistants and Nurse Practitioners) who all work together to provide you with the care you need, when you need it.  We recommend signing up for the patient portal called "MyChart".  Sign up information is provided on this After Visit Summary.  MyChart is used to connect with patients for Virtual Visits (Telemedicine).  Patients are able to view lab/test results, encounter notes, upcoming appointments, etc.  Non-urgent messages can be sent to your provider as well.   To learn more about what you can do with MyChart, go to [email or web address].    Your next appointment:   6 weeks  Provider:   Marjie Skiff  Other Instructions Please check your blood pressure once daily for 2 weeks, then send readings through Mychart.  Please keep a low salt and heart healthy diet.

## 2022-09-19 ENCOUNTER — Ambulatory Visit (INDEPENDENT_AMBULATORY_CARE_PROVIDER_SITE_OTHER): Payer: Medicaid Other | Admitting: Family Medicine

## 2022-09-19 ENCOUNTER — Encounter: Payer: Self-pay | Admitting: Family Medicine

## 2022-09-19 VITALS — BP 152/86 | HR 80 | Temp 97.8°F | Wt 155.2 lb

## 2022-09-19 DIAGNOSIS — J441 Chronic obstructive pulmonary disease with (acute) exacerbation: Secondary | ICD-10-CM

## 2022-09-19 DIAGNOSIS — I1A0 Resistant hypertension: Secondary | ICD-10-CM | POA: Diagnosis not present

## 2022-09-19 DIAGNOSIS — J4489 Other specified chronic obstructive pulmonary disease: Secondary | ICD-10-CM

## 2022-09-19 DIAGNOSIS — N184 Chronic kidney disease, stage 4 (severe): Secondary | ICD-10-CM

## 2022-09-19 DIAGNOSIS — J439 Emphysema, unspecified: Secondary | ICD-10-CM

## 2022-09-19 MED ORDER — HYDRALAZINE HCL 50 MG PO TABS
100.0000 mg | ORAL_TABLET | Freq: Three times a day (TID) | ORAL | 3 refills | Status: DC
Start: 2022-09-19 — End: 2022-10-05

## 2022-09-19 MED ORDER — ACETYLCYSTEINE 10 % IN SOLN
4.0000 mL | RESPIRATORY_TRACT | 12 refills | Status: AC
Start: 2022-09-19 — End: ?

## 2022-09-19 NOTE — Patient Instructions (Signed)
We increased hydralazine to 100 mg daily.  Continue to take all other prescribed medications, including metoprolol, amlodipine, and valsartan, as directed. Follow up with pulmonology and proceed with the ordered CT scan for your lung nodule. We will send an order for a new nebulizer; ensure to use it as instructed, three times a day to prevent episodes. 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 Notify us if there is any significant change in your symptoms or if you have difficulty receiving prescribed medications from the pharmacy.

## 2022-09-19 NOTE — Progress Notes (Signed)
Assessment/Plan:   Problem List Items Addressed This Visit       Cardiovascular and Mediastinum   Resistant hypertension    Hypertension: Blood pressure remains elevated despite multiple medications. Patient is on amlodipine 10mg , hydralazine 50 mg TID, metoprolol 25mg  BID, and valsartan 320mg . -Continue current medications. -Increase hydralazine to 100 mg 3 times daily Continue other medications as ordered       Relevant Medications   hydrALAZINE (APRESOLINE) 50 MG tablet     Respiratory   COPD with chronic bronchitis and emphysema (HCC) - Primary    DME nebulizer ordered      Relevant Medications   acetylcysteine (MUCOMYST) 10 % nebulizer solution   Other Relevant Orders   For home use only DME Nebulizer machine   Other Visit Diagnoses     CKD (chronic kidney disease) stage 4, GFR 15-29 ml/min (HCC)       Relevant Orders   Ambulatory referral to Nephrology   COPD exacerbation (HCC)       Relevant Medications   acetylcysteine (MUCOMYST) 10 % nebulizer solution       Medications Discontinued During This Encounter  Medication Reason   hydrALAZINE (APRESOLINE) 50 MG tablet    acetylcysteine (MUCOMYST) 10 % nebulizer solution Reorder   guaiFENesin-dextromethorphan (ROBITUSSIN DM) 100-10 MG/5ML syrup     Return in about 3 months (around 12/20/2022), or if symptoms worsen or fail to improve, for BP.    Subjective:   Encounter date: 09/19/2022  Kevin [name] is a 67 y.o. 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); HIV disease (HCC); Easy bruising; Coronary artery disease involving native coronary artery of native heart with unstable angina pectoris (HCC); Hypertensive urgency; Callus of foot; Dyspnea; Hypertensive crisis; Acute respiratory failure with hypoxia (HCC); Papillary renal cell carcinoma (HCC); Right renal mass; Renal lesion; Acute diastolic heart failure (HCC); Chronic heart failure with preserved ejection fraction (HFpEF) (HCC); HLD (hyperlipidemia); Chest pain; Elevated troponin; Chronic low back pain; Lipoma; Lung nodule, multiple; Bloody stool; Resistant hypertension; Skin nodule; and Amaurosis fugax on their problem list..   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   Chief Complaint: Follow-up on blood pressure and management of COPD.  History of Present Illness:  Blood Pressure: The patient, Kevin [name], reports being back in the ER since his last visit due to high blood pressure. He was hospitalized for his blood pressure, which was noted to be over 200 mmHg, but at home 150 mmHg. He is on metoprolol, amlodipine, valsartan and hydralazine  COPD: Kevin [name] has ongoing issues with COPD and has a scheduled follow-up with pulmonology. He visited the ER where he states that he he waited for long periods without seeing a doctor, prompting him to leave early. He uses multiple treatments including  Trelegy, as needed DuoNebs, montelukast.  But has been without it for the past  six days.  Pulmonary Nodule: Kevin [name] has a pulmonary nodule and is scheduled for a CT scan on Thursday to get more information on this issue. His ongoing management of the nodule is being followed by his lung doctor.  CKD Stage 4: Kevin [name] reports being refused by [facility] due to no-shows related to his ER visits. He needs a new nephrologist referral, which will be made to [facility].  Review of Systems  Respiratory:  Positive for shortness of breath. Negative for cough.   Cardiovascular:  Negative for chest pain.  Neurological:  Negative for headaches.  All other systems reviewed and are negative.   Past Surgical History:  Procedure Laterality Date   IR RADIOLOGIST EVAL & MGMT  05/31/2021   IR RADIOLOGIST EVAL & MGMT  07/26/2021   LEFT HEART CATH AND CORONARY ANGIOGRAPHY N/A 10/23/2016   Procedure: LEFT HEART CATH AND CORONARY ANGIOGRAPHY;  Surgeon: Marykay Lex, MD;  Location: [facility] INVASIVE CV LAB;  Service: Cardiovascular;  Laterality: N/A;   LOWER EXTREMITY ANGIOGRAPHY N/A 07/17/2016   Procedure: Lower Extremity Angiography;  Surgeon: Runell Gess, MD;  Location: [facility] INVASIVE CV LAB;  Service: Cardiovascular;  Laterality: N/A;   LOWER EXTREMITY INTERVENTION N/A 06/05/2016   Procedure: Lower Extremity Intervention;  Surgeon: Runell Gess, MD;  Location: [facility] INVASIVE CV LAB;  Service: Cardiovascular;  Laterality: N/A;   PERIPHERAL VASCULAR ATHERECTOMY Right 07/17/2016   Procedure: Peripheral Vascular Atherectomy;  Surgeon: Runell Gess, MD;  Location: [facility] INVASIVE CV LAB;  Service: Cardiovascular;  Laterality: Right;  SFA   PERIPHERAL VASCULAR INTERVENTION  06/05/2016   Procedure: Peripheral Vascular Intervention;  Surgeon: Runell Gess, MD;  Location: [facility] INVASIVE CV LAB;  Service: Cardiovascular;;  left SFA   RADIOLOGY WITH ANESTHESIA Right 06/29/2021   Procedure: RIGHT RENAL CRYOABLATION;  Surgeon: Richarda Overlie, MD;  Location: [facility] ORS;  Service: Anesthesiology;   Laterality: Right;    Outpatient Medications Prior to Visit  Medication Sig Dispense Refill   albuterol (VENTOLIN HFA) 108 (90 Base) MCG/ACT inhaler Inhale 2 puffs into the lungs every 4 (four) hours as needed for wheezing or shortness of breath. 54 g 1   amLODipine (NORVASC) 10 MG tablet Take 1 tablet (10 mg total) by mouth daily. 90 tablet 3   atorvastatin (LIPITOR) 40 MG tablet Take 1 tablet (40 mg total) by mouth daily. 60 tablet 3   dolutegravir-lamiVUDine (DOVATO) 50-300 MG tablet Take 1 tablet by mouth daily. 30 tablet 11   doxycycline (VIBRAMYCIN) 100 MG capsule Take 100 mg by mouth 2 (two) times daily.     Fluticasone-Umeclidin-Vilant (TRELEGY ELLIPTA) 200-62.5-25 MCG/ACT AEPB Inhale 1 puff into the lungs daily. 60 each 5   hydrOXYzine (ATARAX) 25 MG tablet Take 25 mg by mouth as needed for anxiety.     metoprolol tartrate (LOPRESSOR) 25 MG tablet Take 1 tablet (25 mg total) by mouth 2 (two) times daily. 180 tablet 3   montelukast (SINGULAIR) 10 MG tablet Take 1 tablet (10 mg total) by mouth at bedtime. 30 tablet 5   oxyCODONE-acetaminophen (PERCOCET) 7.5-325 MG tablet Take 1 tablet by mouth as needed for moderate pain.     predniSONE (DELTASONE) 20 MG tablet Take 1 tablet (20 mg total) by mouth daily. 10 tablet 0   valsartan (DIOVAN) 320 MG tablet Take 320 mg by mouth daily.     hydrALAZINE (APRESOLINE) 50 MG tablet Take 1 tablet (  50 mg total) by mouth 3 (three) times daily. 270 tablet 3   aspirin EC 81 MG tablet Take 1 tablet (81 mg total) by mouth daily. Swallow whole. (Patient not taking: Reported on 09/19/2022) 30 tablet 12   ipratropium-albuterol (DUONEB) 0.5-2.5 (3) MG/3ML SOLN Take 3 mLs by nebulization every 6 (six) hours as needed (wheezing or shortness of breath). (Patient not taking: Reported on 09/19/2022) 120 mL 1   acetylcysteine (MUCOMYST) 10 % nebulizer solution Take 4 mLs by nebulization every 4 (four) hours. (Patient not taking: Reported on 09/19/2022) 30 mL 12    guaiFENesin-dextromethorphan (ROBITUSSIN DM) 100-10 MG/5ML syrup Take 5 mLs by mouth every 6 (six) hours as needed for cough. (Patient not taking: Reported on 09/19/2022) 118 mL 0   No facility-administered medications prior to visit.    Family History  Problem Relation Age of Onset   High blood pressure Mother    Lupus Mother     Social History   Socioeconomic History   Marital status: Significant Other    Spouse name: Not on file   Number of children: Not on file   Years of education: Not on file   Highest education level: Not on file  Occupational History   Not on file  Tobacco Use   Smoking status: Every Day    Current packs/day: 0.10    Average packs/day: 0.1 packs/day for 42.0 years (4.2 ttl pk-yrs)    Types: Cigarettes, E-cigarettes    Passive exposure: Never   Smokeless tobacco: Never   Tobacco comments:    1-2 cigarettes a day; vaping  Vaping Use   Vaping status: Every Day   Substances: Nicotine, Flavoring  Substance and Sexual Activity   Alcohol use: Not Currently    Alcohol/week: 2.0 standard drinks of alcohol    Types: 2 Cans of beer per week    Comment: rare   Drug use: Not Currently    Comment: rare   Sexual activity: Not Currently    Comment: declined condoms  Other Topics Concern   Not on file  Social History Narrative   Not on file   Social Determinants of Health   Financial Resource Strain: Not on file  Food Insecurity: No Food Insecurity (08/07/2022)   Hunger Vital Sign    Worried About Running Out of Food in the Last Year: Never true    Ran Out of Food in the Last Year: Never true  Transportation Needs: Unmet Transportation Needs (07/04/2022)   PRAPARE - Administrator, Civil Service (Medical): Yes    Lack of Transportation (Non-Medical): No  Physical Activity: Not on file  Stress: Stress Concern Present (05/04/2022)   [employer] of Occupational Health - Occupational Stress Questionnaire    Feeling of Stress : To some  extent  Social Connections: Not on file  Intimate Partner Violence: Not At Risk (12/27/2021)   Humiliation, Afraid, Rape, and Kick questionnaire    Fear of Current or Ex-Partner: No    Emotionally Abused: No    Physically Abused: No    Sexually Abused: No  Objective:  Physical Exam: BP (!) 152/86 (BP Location: Left Arm, Patient Position: Sitting, Cuff Size: Large)   Pulse 80   Temp 97.8 F (36.6 C) (Temporal)   Wt 155 lb 3.2 oz (70.4 kg)   SpO2 96%   BMI 21.05 kg/m     Physical Exam Constitutional:      Appearance: Normal appearance.  HENT:     Head: Normocephalic and atraumatic.     Right Ear: Hearing normal.     Left Ear: Hearing normal.     Nose: Nose normal.  Eyes:     General: No scleral icterus.       Right eye: No discharge.        Left eye: No discharge.     Extraocular Movements: Extraocular movements intact.  Cardiovascular:     Rate and Rhythm: Normal rate and regular rhythm.     Heart sounds: Normal heart sounds.  Pulmonary:     Effort: Pulmonary effort is normal.     Breath sounds: Normal breath sounds.  Abdominal:     Palpations: Abdomen is soft.     Tenderness: There is no abdominal tenderness.  Skin:    General: Skin is warm.  Neurological:     General: No focal deficit present.     Mental Status: He is alert.     Cranial Nerves: No cranial nerve deficit.  Psychiatric:        Mood and Affect: Mood normal.        Behavior: Behavior normal.        Thought Content: Thought content normal.        Judgment: Judgment normal.     DG Chest 2 View  Result Date: 09/11/2022 CLINICAL DATA:  67 year old male with shortness of breath. COPD. Expiratory wheezes. EXAM: CHEST - 2 VIEW COMPARISON:  Recent chest CT 09/06/2022 and earlier. FINDINGS: PA and lateral views at 0706 hours. Lung volumes are larger since 2017 and centrilobular emphysema demonstrated on  recent CT. Mediastinal contours remain normal. Visualized tracheal air column is within normal limits. No pneumothorax, pulmonary edema, pleural effusion, or consolidation. Multiple right lung nodules, some enlarged on recent CT, are radiographically occult. No acute osseous abnormality identified. Negative visible bowel gas. IMPRESSION: 1. See recent Chest CT 09/06/2022 report regarding abnormal and increased right lung nodules. 2.  Emphysema (ICD10-J43.9). No new cardiopulmonary abnormality. Electronically Signed   By: Odessa Fleming M.D.   On: 09/11/2022 07:50   CT Chest Wo Contrast  Result Date: 09/11/2022 CLINICAL DATA:  67 year old male with COPD.  Lung nodule follow-up. EXAM: CT CHEST WITHOUT CONTRAST TECHNIQUE: Multidetector CT imaging of the chest was performed following the standard protocol without IV contrast. RADIATION DOSE REDUCTION: This exam was performed according to the departmental dose-optimization program which includes automated exposure control, adjustment of the mA and/or kV according to patient size and/or use of iterative reconstruction technique. COMPARISON:  Chest CT 11/15/2020. FINDINGS: Cardiovascular: No cardiomegaly or pericardial effusion. Minimal calcified thoracic aortic atherosclerosis. Vascular patency is not evaluated in the absence of IV contrast. Mediastinum/Nodes: No evidence of mediastinal mass or lymphadenopathy. Lungs/Pleura: Major airways are patent. Large lung volumes, perhaps slightly larger than in 2022. Subtle evidence of centrilobular emphysema, more apparent in the upper lobes. No pleural effusion.  No consolidation. Right upper lobe peribronchial nodule now measuring up to 7 mm on series 8, image 52 is larger (5 mm on 11/15/2020). Small area of right upper lobe peribronchial ground-glass opacity, 7 mm on series 8, image  84 is stable since 11/15/2020. Small area of combined sub solid and solid nodularity in the narby medial segment of the right middle lobe is regressed  but not resolved on series 8, image 98. Chronic right lower lobe peribronchial sub solid and indistinct opacity is slightly larger since 2022, now up to 15 mm long-axis on series 8, image 107, versus 10 mm previously. Negative left lung. Upper Abdomen: Negative visible noncontrast liver, spleen, pancreas, adrenal glands, kidneys, and bowel in the visible upper abdomen. Musculoskeletal: No acute or suspicious osseous lesion identified. IMPRESSION: 1. Emphysema (ICD10-J43.9) 2 right lung nodules have increased since 2022: - right upper lobe (solid) 7 mm series 8, image 52. - right lower lobe (sub solid) 15 mm series 8 image 107. Adenocarcinoma is not excluded. Recommend referral to [facility]). PET-CT might be the next best imaging step in evaluation. 2. Several other right lung solid and sub solid nodules are stable or regressed. Negative left lung. No evidence of mediastinal lymphadenopathy. Electronically Signed   By: Odessa Fleming M.D.   On: 09/11/2022 07:49   VAS [name] ABI WITH/WO TBI  Result Date: 09/05/2022  LOWER EXTREMITY DOPPLER STUDY Patient Name:  MABLE THEDFORD  Date of Exam:   09/05/2022 Medical Rec #: [id number]        Accession #:    [id number] Date of Birth: 1955-11-06        Patient Gender: M Patient Age:   42 years Exam Location:  [facility] Procedure:      VAS [name] ABI WITH/WO TBI Referring Phys: CALLIE GOODRICH --------------------------------------------------------------------------------  Indications: Claudication, and peripheral artery disease. Patient reports severe              leg pain when walking. He is s/p bilateral SFA atherectomy and              angioplasty in 2018, but has not followed up for this since. He has              calluses on each foot that bother him when walking as well. High Risk Factors: Hypertension, hyperlipidemia, current smoker, coronary artery                    disease. Other Factors: CHF, A-fib, CKD stage III.  Vascular Interventions:  L SFA mid atherectomy and angioplasty 4/18, R SFA                         atherectomy and angioplasty 6/18. Comparison Study: Previous ABIs performed 08/04/16 were 0.95 on the right and                   0.98 on the left. Performing Technologist: Olegario Shearer RVT  Examination Guidelines: A complete evaluation includes at minimum, Doppler waveform signals and systolic blood pressure reading at the level of bilateral brachial, anterior tibial, and posterior tibial arteries, when vessel segments are accessible. Bilateral testing is considered an integral part of a complete examination. Photoelectric Plethysmograph (PPG) waveforms and toe systolic pressure readings are included as required and additional duplex testing as needed. Limited examinations for reoccurring indications may be performed as noted.  ABI Findings: +---------+------------------+-----+----------+--------+ Right    Rt Pressure (mmHg)IndexWaveform  Comment  +---------+------------------+-----+----------+--------+ Brachial 200                                       +---------+------------------+-----+----------+--------+  PTA      163               0.82 monophasic         +---------+------------------+-----+----------+--------+ PERO     163               0.82 monophasic         +---------+------------------+-----+----------+--------+ DP       171               0.86 monophasic         +---------+------------------+-----+----------+--------+ Great Toe96                0.48 Abnormal           +---------+------------------+-----+----------+--------+ +---------+------------------+-----+----------+-------+ Left     Lt Pressure (mmHg)IndexWaveform  Comment +---------+------------------+-----+----------+-------+ Brachial 200                                      +---------+------------------+-----+----------+-------+ PTA      172               0.86 monophasic         +---------+------------------+-----+----------+-------+ PERO     163               0.82 monophasic        +---------+------------------+-----+----------+-------+ DP       153               0.76 monophasic        +---------+------------------+-----+----------+-------+ Great Toe104               0.52 Abnormal          +---------+------------------+-----+----------+-------+ +-------+-----------+-----------+------------+------------+ ABI/TBIToday's ABIToday's TBIPrevious ABIPrevious TBI +-------+-----------+-----------+------------+------------+ Right  0.86       0.48       0.95        0.62         +-------+-----------+-----------+------------+------------+ Left   0.86       0.52       0.98        0.45         +-------+-----------+-----------+------------+------------+  Bilateral ABIs appear decreased compared to prior study on 08/04/16.  Summary: Right: Resting right ankle-brachial index indicates mild right lower extremity arterial disease. The right toe-brachial index is abnormal. Left: Resting left ankle-brachial index indicates mild left lower extremity arterial disease. The left toe-brachial index is abnormal. *See table(s) above for measurements and observations. See arterial duplex report.  Electronically signed by Lance Muss MD on 09/05/2022 at 5:01:20 PM.    Final    VAS [name] LOWER EXTREMITY ARTERIAL DUPLEX  Result Date: 09/05/2022 LOWER EXTREMITY ARTERIAL DUPLEX STUDY Patient Name:  DARELD FELCH  Date of Exam:   09/05/2022 Medical Rec #: [id number]        Accession #:    [id number] Date of Birth: Jan 15, 1956        Patient Gender: M Patient Age:   37 years Exam Location:  [facility] Procedure:      VAS [name] LOWER EXTREMITY ARTERIAL DUPLEX Referring Phys: Marjie Skiff --------------------------------------------------------------------------------  Indications: Claudication, and peripheral artery disease. Patient reports severe              leg pain when walking. He is  s/p bilateral SFA atherectomy and              angioplasty in 2018, but has not followed up for this since.  He has              calluses on each foot that bother him when walking as well. High Risk Factors: Hypertension, hyperlipidemia, current smoker, coronary artery                    disease. Other Factors: CHF, A-fib, CKD stage III.  Vascular Interventions: L SFA mid atherectomy and angioplasty 4/18, R SFA                         atherectomy and angioplasty 6/18. Current ABI:            Right-0.86                         Left-0.86 Comparison Study: Previous right arterial duplex 08/04/16 showed distal SFA psv                   272 cm/sec. Previous left arterial duplex 06/19/16 showed left                   SFA origin psv of 344 cm/sec. Performing Technologist: Olegario Shearer RVT  Examination Guidelines: A complete evaluation includes B-mode imaging, spectral Doppler, color Doppler, and power Doppler as needed of all accessible portions of each vessel. Bilateral testing is considered an integral part of a complete examination. Limited examinations for reoccurring indications may be performed as noted.  +-----------+--------+-----+---------------+----------+------------------+ RIGHT      PSV cm/sRatioStenosis       Waveform  Comments           +-----------+--------+-----+---------------+----------+------------------+ CFA Prox   103                         triphasic                    +-----------+--------+-----+---------------+----------+------------------+ CFA Distal 57                          triphasic                    +-----------+--------+-----+---------------+----------+------------------+ DFA        75                          triphasic                    +-----------+--------+-----+---------------+----------+------------------+ SFA Prox   90                          monophasic                   +-----------+--------+-----+---------------+----------+------------------+  SFA Mid    193     2.5  50-74% stenosismonophasicbased on VR/plaque +-----------+--------+-----+---------------+----------+------------------+ SFA Distal 167                         monophasic                   +-----------+--------+-----+---------------+----------+------------------+ POP Prox   164          30-49% stenosismonophasic                   +-----------+--------+-----+---------------+----------+------------------+ POP Distal 36                                                       +-----------+--------+-----+---------------+----------+------------------+  TP Trunk   125                                                      +-----------+--------+-----+---------------+----------+------------------+ ATA Distal 0            occluded                                    +-----------+--------+-----+---------------+----------+------------------+ PTA Prox   0            occluded                                    +-----------+--------+-----+---------------+----------+------------------+ PTA Mid    0            occluded                                    +-----------+--------+-----+---------------+----------+------------------+ PTA Distal 0            occluded                                    +-----------+--------+-----+---------------+----------+------------------+ PERO Prox  210          50-74% stenosis                             +-----------+--------+-----+---------------+----------+------------------+ PERO Mid   56                          monophasic                   +-----------+--------+-----+---------------+----------+------------------+ PERO Distal80                          monophasic                   +-----------+--------+-----+---------------+----------+------------------+ A focal velocity elevation of 193 cm/s was obtained at mid SFA with post stenotic turbulence with a VR of 2.5. Findings are characteristic of 50-74%  stenosis, based on VR and plaque. A 2nd focal velocity elevation was visualized, measuring 125 cm/s at TPT with a VR of 2.2. Findings are characteristic of 50-74% stenosis based on VR.  +-----------+--------+-----+---------------+----------+------------------------+ LEFT       PSV cm/sRatioStenosis       Waveform  Comments                 +-----------+--------+-----+---------------+----------+------------------------+ CFA Prox   57                          triphasic                          +-----------+--------+-----+---------------+----------+------------------------+ CFA Distal 60                          triphasic                          +-----------+--------+-----+---------------+----------+------------------------+  DFA        129                         triphasic plaque                   +-----------+--------+-----+---------------+----------+------------------------+ SFA Prox   69                          triphasic                          +-----------+--------+-----+---------------+----------+------------------------+ SFA Mid    245     2.8  50-74% stenosistriphasic plaque                   +-----------+--------+-----+---------------+----------+------------------------+ SFA Distal 439     3.0  50-74% stenosisstenotic  high-end of range based                                                   on PSV                   +-----------+--------+-----+---------------+----------+------------------------+ POP Prox   65                          monophasic                         +-----------+--------+-----+---------------+----------+------------------------+ POP Distal 33                          monophasic                         +-----------+--------+-----+---------------+----------+------------------------+ TP Trunk   75                          monophasic                          +-----------+--------+-----+---------------+----------+------------------------+ ATA Prox   0            occluded                                          +-----------+--------+-----+---------------+----------+------------------------+ ATA Distal 65                                                             +-----------+--------+-----+---------------+----------+------------------------+ PTA Distal 0            occluded                                          +-----------+--------+-----+---------------+----------+------------------------+ PERO Distal68  monophasic                         +-----------+--------+-----+---------------+----------+------------------------+ A focal velocity elevation of 245 cm/s was obtained at mid SFA with post stenotic turbulence with a VR of 2.8. Findings are characteristic of 50-74% stenosis. A 2nd focal velocity elevation was visualized, measuring 437 cm/s at distal SFA with post stenotic turbulence with a VR of 2.99. Findings are characteristic of 50-74% stenosis, high-end of range based on PSV.  Summary: Right: 50-74% stenosis noted in the superficial femoral artery. 30-49% stenosis noted in the popliteal artery. 50-74% stenosis of the tibioperoneal trunk artery. 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 Total occlusion noted in the posterior tibial artery. Left: 50-74% stenosis noted in the mid and distal superficial femoral artery (high-end of range in the distal SFA based on PSV). Total occlusion noted in the anterior tibial and posterior tibial arteries.  See table(s) above for measurements and observations. See ABI report. Suggest Peripheral Vascular Consult if clinically indicated. Electronically signed by Lance Muss MD on 09/05/2022 at 4:58:00 PM.    Final    VAS US CAROTID  Result Date: 09/05/2022 Carotid Arterial Duplex Study Patient Name:  [name]  Date of Exam:   09/05/2022 Medical Rec #:  [id number]        Accession #:    [id number] Date of Birth: Sep 20, 1955        Patient Gender: M Patient Age:   79 years Exam Location:  [facility] Procedure:      VAS US CAROTID Referring Phys: Marjie Skiff --------------------------------------------------------------------------------  Indications:   Visual disturbance.Patient reports he has had some transient                vision loss involving the left eye, where his vision 'went black'                for about 15 minutes and then returned. Risk Factors:  Hypertension, hyperlipidemia, current smoker, coronary artery                disease. Other Factors: CHF, A-fib, CKD stage III. Performing Technologist: Olegario Shearer RVT  Examination Guidelines: A complete evaluation includes B-mode imaging, spectral Doppler, color Doppler, and power Doppler as needed of all accessible portions of each vessel. Bilateral testing is considered an integral part of a complete examination. Limited examinations for reoccurring indications may be performed as noted.  Right Carotid Findings: +----------+--------+--------+--------+------------------+--------+           PSV cm/sEDV cm/sStenosisPlaque DescriptionComments +----------+--------+--------+--------+------------------+--------+ CCA Prox  80      17                                         +----------+--------+--------+--------+------------------+--------+ CCA Distal45      11                                         +----------+--------+--------+--------+------------------+--------+ ICA Prox  41      12              smooth                     +----------+--------+--------+--------+------------------+--------+ ICA Mid   58      22  1-39%                              +----------+--------+--------+--------+------------------+--------+ ICA Distal65      24                                         +----------+--------+--------+--------+------------------+--------+ ECA       81       12                                         +----------+--------+--------+--------+------------------+--------+ +----------+--------+-------+----------------+-------------------+           PSV cm/sEDV cmsDescribe        Arm Pressure (mmHG) +----------+--------+-------+----------------+-------------------+ [id number]            Multiphasic, WNL200                 +----------+--------+-------+----------------+-------------------+ +---------+--------+--+--------+--+---------+ VertebralPSV cm/s76EDV cm/s24Antegrade +---------+--------+--+--------+--+---------+  Left Carotid Findings: +----------+--------+--------+--------+------------------+------------------+           PSV cm/sEDV cm/sStenosisPlaque DescriptionComments           +----------+--------+--------+--------+------------------+------------------+ CCA Prox  85      18                                                   +----------+--------+--------+--------+------------------+------------------+ CCA Distal91      20                                intimal thickening +----------+--------+--------+--------+------------------+------------------+ ICA Prox  58      19              heterogenous                         +----------+--------+--------+--------+------------------+------------------+ ICA Mid   87      27      1-39%                                        +----------+--------+--------+--------+------------------+------------------+ ICA Distal65      25                                tortuous           +----------+--------+--------+--------+------------------+------------------+ ECA       77      9                                                    +----------+--------+--------+--------+------------------+------------------+ +----------+--------+--------+----------------+-------------------+           PSV cm/sEDV cm/sDescribe        Arm Pressure (mmHG)  +----------+--------+--------+----------------+-------------------+ [id number]             Multiphasic, WNL200                 +----------+--------+--------+----------------+-------------------+ +---------+--------+--+--------+--+----------------------+  VertebralPSV cm/s26EDV cm/s11Antegrade and Atypical +---------+--------+--+--------+--+----------------------+   Summary: Right Carotid: Velocities in the right ICA are consistent with a 1-39% stenosis. Left Carotid: Velocities in the left ICA are consistent with a 1-39% stenosis. Vertebrals:  Bilateral vertebral arteries demonstrate antegrade flow, atypical              antegrade flow on the left. Subclavians: Normal flow hemodynamics were seen in bilateral subclavian              arteries. *See table(s) above for measurements and observations.  Electronically signed by Lance Muss MD on 09/05/2022 at 4:54:38 PM.    Final    DG Chest Port 1 View  Result Date: 08/17/2022 CLINICAL DATA:  Shortness of breath, hypertension.  Smoker. EXAM: PORTABLE CHEST 1 VIEW COMPARISON:  08/15/2022 FINDINGS: The heart size and mediastinal contours are within normal limits. Both lungs are clear. The visualized skeletal structures are unremarkable. IMPRESSION: No active disease. Electronically Signed   By: Signa Kell M.D.   On: 08/17/2022 08:52   DG Chest 2 View  Result Date: 08/15/2022 CLINICAL DATA:  COPD. EXAM: CHEST - 2 VIEW COMPARISON:  07/25/2022 FINDINGS: Heart size and mediastinal contours are normal. Mild central airway thickening with peribronchial cuffing. No pleural fluid, interstitial edema or airspace disease. Visualized osseous structures are unremarkable. IMPRESSION: Mild central airway thickening with peribronchial cuffing. No focal airspace disease. Electronically Signed   By: Signa Kell M.D.   On: 08/15/2022 07:36   DG Chest Portable 1 View  Result Date: 07/25/2022 CLINICAL DATA:  Shortness of breath. EXAM: PORTABLE CHEST 1 VIEW  COMPARISON:  July 19, 2022 FINDINGS: The heart size and mediastinal contours are within normal limits. Very mild bilateral infrahilar atelectatic changes are seen. There is no evidence of acute infiltrate, pleural effusion or pneumothorax. The visualized skeletal structures are unremarkable. IMPRESSION: Very mild bilateral infrahilar atelectasis. Electronically Signed   By: Aram Candela M.D.   On: 07/25/2022 00:34   DG Chest 2 View  Result Date: 07/19/2022 CLINICAL DATA:  Chest pain, shortness of breath. EXAM: CHEST - 2 VIEW COMPARISON:  Jul 14, 2022. FINDINGS: The heart size and mediastinal contours are within normal limits. Both lungs are clear. The visualized skeletal structures are unremarkable. IMPRESSION: No active cardiopulmonary disease. Electronically Signed   By: Lupita Raider M.D.   On: 07/19/2022 16:16   DG Chest Portable 1 View  Result Date: 07/14/2022 CLINICAL DATA:  Chest pain with shortness of breath EXAM: PORTABLE CHEST 1 VIEW COMPARISON:  Chest x-ray 07/08/2022 FINDINGS: The heart size and mediastinal contours are within normal limits. Both lungs are clear. The visualized skeletal structures are unremarkable. IMPRESSION: No active disease. Electronically Signed   By: Darliss Cheney M.D.   On: 07/14/2022 21:45   DG Chest 2 View  Result Date: 07/08/2022 CLINICAL DATA:  Shortness of breath and cough. EXAM: CHEST - 2 VIEW COMPARISON:  06/01/2022 FINDINGS: Heart size and mediastinal contours are unremarkable. No pleural fluid or edema. Central airway thickening. Lungs appear hyperinflated. No airspace disease. Visualized osseous structures appear intact. IMPRESSION: Central airway thickening compatible with bronchitis or reactive airways disease. Electronically Signed   By: Signa Kell M.D.   On: 07/08/2022 14:12    Recent Results (from the past 2160 hour(s))  T-helper cells (CD4) count (not at [facility])     Status: None   Collection Time: 07/06/22 11:29 AM  Result Value Ref Range    CD4 T Cell Abs 842 400 - 1,790 /uL  CD4 % Helper T Cell 52 33 - 65 %    Comment: Performed at [facility], [address], [place], [place] [address]  HIV-1 RNA quant-no reflex-bld     Status: None   Collection Time: 07/06/22 11:45 AM  Result Value Ref Range   HIV 1 RNA Quant Not Detected Copies/mL   HIV-1 RNA Quant, Log Not Detected Log cps/mL    Comment: . Reference Range:                           Not Detected     copies/mL                           Not Detected Log copies/mL . Marland Kitchen The test was performed using Real-Time Polymerase Chain Reaction. . . Reportable Range: 20 copies/mL to 10,000,000 copies/mL (1.30 Log copies/mL to 7.00 Log copies/mL). .   COMPLETE METABOLIC PANEL WITH GFR     Status: Abnormal   Collection Time: 07/06/22 11:45 AM  Result Value Ref Range   Glucose, Bld 60 (L) 65 - 99 mg/dL    Comment: .            Fasting reference interval .    BUN 17 7 - 25 mg/dL   Creat 2.99 (H) 3.71 - 1.35 mg/dL   eGFR 36 (L) > OR = 60 mL/min/1.15m2   BUN/Creatinine Ratio 8 6 - 22 (calc)   Sodium 142 135 - 146 mmol/L   Potassium 4.0 3.5 - 5.3 mmol/L   Chloride 111 (H) 98 - 110 mmol/L    Comment: Verified by repeat analysis. .    CO2 24 20 - 32 mmol/L   Calcium 8.5 (L) 8.6 - 10.3 mg/dL   Total Protein 7.2 6.1 - 8.1 g/dL   Albumin 4.2 3.6 - 5.1 g/dL   Globulin 3.0 1.9 - 3.7 g/dL (calc)   AG Ratio 1.4 1.0 - 2.5 (calc)   Total Bilirubin 0.5 0.2 - 1.2 mg/dL   Alkaline phosphatase (APISO) 75 35 - 144 U/L   AST 12 10 - 35 U/L   ALT 4 (L) 9 - 46 U/L  CBC with Differential/Platelet     Status: Abnormal   Collection Time: 07/06/22 11:45 AM  Result Value Ref Range   WBC 7.3 3.8 - 10.8 Thousand/uL   RBC 3.79 (L) 4.20 - 5.80 Million/uL   Hemoglobin 11.9 (L) 13.2 - 17.1 g/dL   HCT 69.6 (L) 78.9 - 38.1 %   MCV 96.3 80.0 - 100.0 fL   MCH 31.4 27.0 - 33.0 pg   MCHC 32.6 32.0 - 36.0 g/dL   RDW 01.7 51.0 - 25.8 %   Platelets 319 140 - 400 Thousand/uL    MPV 9.9 7.5 - 12.5 fL   Neutro Abs 4,482 1,500 - 7,800 cells/uL   Lymphs Abs 1,840 850 - 3,900 cells/uL   Absolute Monocytes 387 200 - 950 cells/uL   Eosinophils Absolute 533 (H) 15 - 500 cells/uL   Basophils Absolute 58 0 - 200 cells/uL   Neutrophils Relative % 61.4 %   Total Lymphocyte 25.2 %   Monocytes Relative 5.3 %   Eosinophils Relative 7.3 %   Basophils Relative 0.8 %  RPR     Status: None   Collection Time: 07/06/22 11:45 AM  Result Value Ref Range   RPR Ser Ql NON-REACTIVE NON-REACTIVE    Comment: . No laboratory evidence  of syphilis. If recent exposure is suspected, submit a new sample in 2-4 weeks. .   Lipid panel     Status: Abnormal   Collection Time: 07/06/22 11:45 AM  Result Value Ref Range   Cholesterol 184 <200 mg/dL   HDL 52 > OR = 40 mg/dL   Triglycerides 161 <096 mg/dL   LDL Cholesterol (Calc) 110 (H) mg/dL (calc)    Comment: Reference range: <100 . Desirable range <100 mg/dL for primary prevention;   <70 mg/dL for patients with CHD or diabetic patients  with > or = 2 CHD risk factors. Marland Kitchen LDL-C is now calculated using the Martin-Hopkins  calculation, which is a validated novel method providing  better accuracy than the Friedewald equation in the  estimation of LDL-C.  Horald Pollen et al. Lenox Ahr. 0454;098(11): 2061-2068  (http://education.[employer].com/faq/FAQ164)    Total CHOL/HDL Ratio 3.5 <5.0 (calc)   Non-HDL Cholesterol (Calc) 132 (H) <130 mg/dL (calc)    Comment: For patients with diabetes plus 1 major ASCVD risk  factor, treating to a non-HDL-C goal of <100 mg/dL  (LDL-C of <91 mg/dL) is considered a therapeutic  option.   CBC     Status: Abnormal   Collection Time: 07/14/22  9:29 PM  Result Value Ref Range   WBC 11.8 (H) 4.0 - 10.5 K/uL   RBC 3.53 (L) 4.22 - 5.81 MIL/uL   Hemoglobin 11.3 (L) 13.0 - 17.0 g/dL   HCT 47.8 (L) 29.5 - 62.1 %   MCV 95.8 80.0 - 100.0 fL   MCH 32.0 26.0 - 34.0 pg   MCHC 33.4 30.0 - 36.0 g/dL   RDW 30.8  65.7 - 84.6 %   Platelets 298 150 - 400 K/uL   nRBC 0.0 0.0 - 0.2 %    Comment: Performed at [facility], [address] [place], [place] [address]  Troponin I (High Sensitivity)     Status: Abnormal   Collection Time: 07/14/22  9:29 PM  Result Value Ref Range   Troponin I (High Sensitivity) 33 (H) <18 ng/L    Comment: (NOTE) Elevated high sensitivity troponin I (hsTnI) values and significant  changes across serial measurements may suggest ACS but many other  chronic and acute conditions are known to elevate hsTnI results.  Refer to the "Links" section for chest pain algorithms and additional  guidance. Performed at [facility], [address] [place], [place] [address]   Brain natriuretic peptide     Status: None   Collection Time: 07/14/22  9:29 PM  Result Value Ref Range   B Natriuretic Peptide 43.4 0.0 - 100.0 pg/mL    Comment: Performed at [facility], [address] [place], [place] [address]  Troponin I (High Sensitivity)     Status: Abnormal   Collection Time: 07/14/22 11:26 PM  Result Value Ref Range   Troponin I (High Sensitivity) 42 (H) <18 ng/L    Comment: (NOTE) Elevated high sensitivity troponin I (hsTnI) values and significant  changes across serial measurements may suggest ACS but many other  chronic and acute conditions are known to elevate hsTnI results.  Refer to the "Links" section for chest pain algorithms and additional  guidance. Performed at [facility], [address], [place], [place] [address]   Basic metabolic panel     Status: Abnormal   Collection Time: 07/14/22 11:26 PM  Result Value Ref Range   Sodium 138 135 - 145 mmol/L   Potassium 3.7 3.5 -  5.1 mmol/L   Chloride 109 98 - 111 mmol/L   CO2 20 (L) 22 - 32 mmol/L   Glucose, Bld 102 (H) 70 - 99 mg/dL    Comment: Glucose reference range applies only to samples taken after fasting for at least 8 hours.   BUN 23 8 - 23 mg/dL   Creatinine, Ser 6.96 (H) 0.61 - 1.24  mg/dL   Calcium 8.5 (L) 8.9 - 10.3 mg/dL   GFR, Estimated 36 (L) >60 mL/min    Comment: (NOTE) Calculated using the CKD-EPI Creatinine Equation (2021)    Anion gap 9 5 - 15    Comment: Performed at [facility], [address], [place], [place] [address]  Basic metabolic panel     Status: Abnormal   Collection Time: 07/19/22  3:45 PM  Result Value Ref Range   Sodium 136 135 - 145 mmol/L   Potassium 3.5 3.5 - 5.1 mmol/L   Chloride 106 98 - 111 mmol/L   CO2 23 22 - 32 mmol/L   Glucose, Bld 107 (H) 70 - 99 mg/dL    Comment: Glucose reference range applies only to samples taken after fasting for at least 8 hours.   BUN 14 8 - 23 mg/dL   Creatinine, Ser 4.13 (H) 0.61 - 1.24 mg/dL   Calcium 8.4 (L) 8.9 - 10.3 mg/dL   GFR, Estimated 42 (L) >60 mL/min    Comment: (NOTE) Calculated using the CKD-EPI Creatinine Equation (2021)    Anion gap 7 5 - 15    Comment: Performed at [facility], [address], [place], [place] [address]  CBC     Status: Abnormal   Collection Time: 07/19/22  3:45 PM  Result Value Ref Range   WBC 9.8 4.0 - 10.5 K/uL   RBC 3.67 (L) 4.22 - 5.81 MIL/uL   Hemoglobin 11.5 (L) 13.0 - 17.0 g/dL   HCT 02.7 (L) 25.3 - 66.4 %   MCV 94.3 80.0 - 100.0 fL   MCH 31.3 26.0 - 34.0 pg   MCHC 33.2 30.0 - 36.0 g/dL   RDW 40.3 47.4 - 25.9 %   Platelets 298 150 - 400 K/uL   nRBC 0.0 0.0 - 0.2 %    Comment: Performed at [facility], [address] [place], [place] [address]  Troponin I (High Sensitivity)     Status: Abnormal   Collection Time: 07/19/22  3:45 PM  Result Value Ref Range   Troponin I (High Sensitivity) 37 (H) <18 ng/L    Comment: (NOTE) Elevated high sensitivity troponin I (hsTnI) values and significant  changes across serial measurements may suggest ACS but many other  chronic and acute conditions are known to elevate hsTnI results.  Refer to the "Links" section for chest pain algorithms and additional  guidance. Performed at [facility], [address] [place], [place] [address]   Type and screen [facility]     Status: None   Collection Time: 07/19/22  3:45 PM  Result Value Ref Range   ABO/RH(D) O POS    Antibody Screen NEG    Sample Expiration      07/22/2022,2359 Performed at [facility], [address], [place], [place] [address]   Troponin I (High Sensitivity)     Status: Abnormal   Collection Time: 07/19/22  8:07 PM  Result Value Ref Range   Troponin I (High Sensitivity) 36 (H) <18 ng/L    Comment: (  NOTE) Elevated high sensitivity troponin I (hsTnI) values and significant  changes across serial measurements may suggest ACS but many other  chronic and acute conditions are known to elevate hsTnI results.  Refer to the "Links" section for chest pain algorithms and additional  guidance. Performed at [facility], [address], [place], [place] [address]   POC occult blood, ED     Status: Abnormal   Collection Time: 07/19/22  9:19 PM  Result Value Ref Range   Fecal Occult Bld POSITIVE (A) NEGATIVE  Comprehensive metabolic panel     Status: Abnormal   Collection Time: 07/25/22 12:17 AM  Result Value Ref Range   Sodium 137 135 - 145 mmol/L   Potassium 4.3 3.5 - 5.1 mmol/L    Comment: HEMOLYSIS AT THIS LEVEL MAY AFFECT RESULT   Chloride 104 98 - 111 mmol/L   CO2 24 22 - 32 mmol/L   Glucose, Bld 121 (H) 70 - 99 mg/dL    Comment: Glucose reference range applies only to samples taken after fasting for at least 8 hours.   BUN 19 8 - 23 mg/dL   Creatinine, Ser 6.04 (H) 0.61 - 1.24 mg/dL   Calcium 8.4 (L) 8.9 - 10.3 mg/dL   Total Protein 6.9 6.5 - 8.1 g/dL   Albumin 3.5 3.5 - 5.0 g/dL   AST 25 15 - 41 U/L    Comment: HEMOLYSIS AT THIS LEVEL MAY AFFECT RESULT   ALT 16 0 - 44 U/L    Comment: HEMOLYSIS AT THIS LEVEL MAY AFFECT RESULT   Alkaline Phosphatase 70 38 - 126 U/L   Total Bilirubin 1.0 0.3 - 1.2 mg/dL    Comment: HEMOLYSIS AT THIS LEVEL MAY AFFECT RESULT   GFR,  Estimated 36 (L) >60 mL/min    Comment: (NOTE) Calculated using the CKD-EPI Creatinine Equation (2021)    Anion gap 9 5 - 15    Comment: Performed at [facility], [address], [place], [place] [address]  Troponin I (High Sensitivity)     Status: Abnormal   Collection Time: 07/25/22 12:17 AM  Result Value Ref Range   Troponin I (High Sensitivity) 45 (H) <18 ng/L    Comment: (NOTE) Elevated high sensitivity troponin I (hsTnI) values and significant  changes across serial measurements may suggest ACS but many other  chronic and acute conditions are known to elevate hsTnI results.  Refer to the "Links" section for chest pain algorithms and additional  guidance. Performed at [facility], [address], [place], [place] [address]   CBC with Differential     Status: Abnormal   Collection Time: 07/25/22 12:17 AM  Result Value Ref Range   WBC 12.1 (H) 4.0 - 10.5 K/uL   RBC 3.63 (L) 4.22 - 5.81 MIL/uL   Hemoglobin 11.6 (L) 13.0 - 17.0 g/dL   HCT 78.2 (L) 95.6 - 21.3 %   MCV 96.4 80.0 - 100.0 fL   MCH 32.0 26.0 - 34.0 pg   MCHC 33.1 30.0 - 36.0 g/dL   RDW 08.6 57.8 - 46.9 %   Platelets 289 150 - 400 K/uL   nRBC 0.0 0.0 - 0.2 %   Neutrophils Relative % 53 %   Neutro Abs 6.3 1.7 - 7.7 K/uL   Lymphocytes Relative 29 %   Lymphs Abs 3.5 0.7 - 4.0 K/uL   Monocytes Relative 7 %   Monocytes Absolute 0.9 0.1 - 1.0 K/uL   Eosinophils Relative 11 %  Eosinophils Absolute 1.3 (H) 0.0 - 0.5 K/uL   Basophils Relative 0 %   Basophils Absolute 0.0 0.0 - 0.1 K/uL   Immature Granulocytes 0 %   Abs Immature Granulocytes 0.05 0.00 - 0.07 K/uL    Comment: Performed at [facility], [address], [place], [place] [address]  Brain natriuretic peptide     Status: None   Collection Time: 07/25/22 12:17 AM  Result Value Ref Range   B Natriuretic Peptide 18.8 0.0 - 100.0 pg/mL    Comment: Performed at [facility], [address], [place], [place] [address]  I-Stat venous  blood gas, ED     Status: Abnormal   Collection Time: 07/25/22 12:39 AM  Result Value Ref Range   pH, Ven 7.347 7.25 - 7.43   pCO2, Ven 49.6 44 - 60 mmHg   pO2, Ven 35 32 - 45 mmHg   Bicarbonate 27.2 20.0 - 28.0 mmol/L   TCO2 29 22 - 32 mmol/L   O2 Saturation 62 %   Acid-Base Excess 1.0 0.0 - 2.0 mmol/L   Sodium 142 135 - 145 mmol/L   Potassium 4.3 3.5 - 5.1 mmol/L   Calcium, Ion 1.12 (L) 1.15 - 1.40 mmol/L   HCT 36.0 (L) 39.0 - 52.0 %   Hemoglobin 12.2 (L) 13.0 - 17.0 g/dL   Sample type VENOUS   I-Stat venous blood gas, ED     Status: Abnormal   Collection Time: 07/25/22 12:41 AM  Result Value Ref Range   pH, Ven 7.348 7.25 - 7.43   pCO2, Ven 50.1 44 - 60 mmHg   pO2, Ven 41 32 - 45 mmHg   Bicarbonate 27.6 20.0 - 28.0 mmol/L   TCO2 29 22 - 32 mmol/L   O2 Saturation 72 %   Acid-Base Excess 1.0 0.0 - 2.0 mmol/L   Sodium 139 135 - 145 mmol/L   Potassium 6.4 (HH) 3.5 - 5.1 mmol/L   Calcium, Ion 1.06 (L) 1.15 - 1.40 mmol/L   HCT 36.0 (L) 39.0 - 52.0 %   Hemoglobin 12.2 (L) 13.0 - 17.0 g/dL   Sample type VENOUS    Comment NOTIFIED PHYSICIAN   Troponin I (High Sensitivity)     Status: Abnormal   Collection Time: 07/25/22  2:59 AM  Result Value Ref Range   Troponin I (High Sensitivity) 41 (H) <18 ng/L    Comment: (NOTE) Elevated high sensitivity troponin I (hsTnI) values and significant  changes across serial measurements may suggest ACS but many other  chronic and acute conditions are known to elevate hsTnI results.  Refer to the "Links" section for chest pain algorithms and additional  guidance. Performed at [facility], [address], [place], [place] [address]   Basic Metabolic Panel (BMET)     Status: Abnormal   Collection Time: 07/26/22 11:04 AM  Result Value Ref Range   Sodium 139 135 - 145 mEq/L   Potassium 4.7 3.5 - 5.1 mEq/L   Chloride 106 96 - 112 mEq/L   CO2 24 19 - 32 mEq/L   Glucose, Bld 84 70 - 99 mg/dL   BUN 27 (H) 6 - 23 mg/dL   Creatinine, Ser  9.62 (H) 0.40 - 1.50 mg/dL   GFR 95.28 (L) >41.32 mL/min    Comment: Calculated using the CKD-EPI Creatinine Equation (2021)   Calcium 9.2 8.4 - 10.5 mg/dL  Basic metabolic panel     Status: Abnormal   Collection Time: 08/15/22  6:59  AM  Result Value Ref Range   Sodium 138 135 - 145 mmol/L   Potassium 4.4 3.5 - 5.1 mmol/L   Chloride 106 98 - 111 mmol/L   CO2 21 (L) 22 - 32 mmol/L   Glucose, Bld 95 70 - 99 mg/dL    Comment: Glucose reference range applies only to samples taken after fasting for at least 8 hours.   BUN 21 8 - 23 mg/dL   Creatinine, Ser 1.61 (H) 0.61 - 1.24 mg/dL   Calcium 9.0 8.9 - 09.6 mg/dL   GFR, Estimated 39 (L) >60 mL/min    Comment: (NOTE) Calculated using the CKD-EPI Creatinine Equation (2021)    Anion gap 11 5 - 15    Comment: Performed at [facility], [address] [place], [place] [address]  CBC     Status: Abnormal   Collection Time: 08/15/22  6:59 AM  Result Value Ref Range   WBC 8.8 4.0 - 10.5 K/uL   RBC 3.65 (L) 4.22 - 5.81 MIL/uL   Hemoglobin 11.5 (L) 13.0 - 17.0 g/dL   HCT 98.1 (L) 19.1 - 47.8 %   MCV 93.4 80.0 - 100.0 fL   MCH 31.5 26.0 - 34.0 pg   MCHC 33.7 30.0 - 36.0 g/dL   RDW 29.5 62.1 - 30.8 %   Platelets 310 150 - 400 K/uL   nRBC 0.0 0.0 - 0.2 %    Comment: Performed at [facility], [address] [place], [place] [address]  CBC with Differential     Status: Abnormal   Collection Time: 08/17/22  7:36 AM  Result Value Ref Range   WBC 12.2 (H) 4.0 - 10.5 K/uL   RBC 3.51 (L) 4.22 - 5.81 MIL/uL   Hemoglobin 11.1 (L) 13.0 - 17.0 g/dL   HCT 69.6 (L) 29.5 - 28.4 %   MCV 94.9 80.0 - 100.0 fL   MCH 31.6 26.0 - 34.0 pg   MCHC 33.3 30.0 - 36.0 g/dL   RDW 13.2 44.0 - 10.2 %   Platelets 360 150 - 400 K/uL   nRBC 0.0 0.0 - 0.2 %   Neutrophils Relative % 81 %   Neutro Abs 9.8 (H) 1.7 - 7.7 K/uL   Lymphocytes Relative 14 %   Lymphs Abs 1.7 0.7 - 4.0 K/uL   Monocytes Relative 5 %   Monocytes Absolute 0.6 0.1 - 1.0 K/uL    Eosinophils Relative 0 %   Eosinophils Absolute 0.0 0.0 - 0.5 K/uL   Basophils Relative 0 %   Basophils Absolute 0.0 0.0 - 0.1 K/uL   Immature Granulocytes 0 %   Abs Immature Granulocytes 0.04 0.00 - 0.07 K/uL    Comment: Performed at [facility], [address] [place], [place] [address]  Basic metabolic panel     Status: Abnormal   Collection Time: 08/17/22  8:15 AM  Result Value Ref Range   Sodium 136 135 - 145 mmol/L   Potassium 5.5 (H) 3.5 - 5.1 mmol/L    Comment: HEMOLYSIS AT THIS LEVEL MAY AFFECT RESULT   Chloride 101 98 - 111 mmol/L   CO2 22 22 - 32 mmol/L   Glucose, Bld 133 (H) 70 - 99 mg/dL    Comment: Glucose reference range applies only to samples taken after fasting for at least 8 hours.   BUN 34 (H) 8 - 23 mg/dL   Creatinine, Ser 6.44 (H) 0.61 - 1.24 mg/dL   Calcium 8.9 8.9 - 10.3  mg/dL   GFR, Estimated 26 (L) >60 mL/min    Comment: (NOTE) Calculated using the CKD-EPI Creatinine Equation (2021)    Anion gap 13 5 - 15    Comment: Performed at [facility], [address] [place], [place] [address]  VAS [name] ABI WITH/WO TBI     Status: None   Collection Time: 09/05/22  2:11 PM  Result Value Ref Range   Right ABI 0.86    Left ABI 0.86   Comprehensive metabolic panel     Status: Abnormal   Collection Time: 09/11/22  6:54 AM  Result Value Ref Range   Sodium 137 135 - 145 mmol/L   Potassium 4.2 3.5 - 5.1 mmol/L   Chloride 110 98 - 111 mmol/L   CO2 18 (L) 22 - 32 mmol/L   Glucose, Bld 92 70 - 99 mg/dL    Comment: Glucose reference range applies only to samples taken after fasting for at least 8 hours.   BUN 21 8 - 23 mg/dL   Creatinine, Ser 1.91 (H) 0.61 - 1.24 mg/dL   Calcium 8.5 (L) 8.9 - 10.3 mg/dL   Total Protein 6.9 6.5 - 8.1 g/dL   Albumin 3.1 (L) 3.5 - 5.0 g/dL   AST 13 (L) 15 - 41 U/L   ALT 10 0 - 44 U/L   Alkaline Phosphatase 69 38 - 126 U/L   Total Bilirubin 0.5 0.3 - 1.2 mg/dL   GFR, Estimated 34 (L) >60 mL/min    Comment:  (NOTE) Calculated using the CKD-EPI Creatinine Equation (2021)    Anion gap 9 5 - 15    Comment: Performed at [facility], [address] [place], [place] [address]  CBC     Status: Abnormal   Collection Time: 09/11/22  6:54 AM  Result Value Ref Range   WBC 6.1 4.0 - 10.5 K/uL   RBC 3.31 (L) 4.22 - 5.81 MIL/uL   Hemoglobin 10.8 (L) 13.0 - 17.0 g/dL   HCT 56.2 (L) 13.0 - 86.5 %   MCV 99.7 80.0 - 100.0 fL   MCH 32.6 26.0 - 34.0 pg   MCHC 32.7 30.0 - 36.0 g/dL   RDW 78.4 69.6 - 29.5 %   Platelets 255 150 - 400 K/uL   nRBC 0.0 0.0 - 0.2 %    Comment: Performed at [facility], [address], [place], [place] [address]  Resp panel by RT-PCR (RSV, Flu A&B, Covid) Anterior Nasal Swab     Status: None   Collection Time: 09/11/22  8:56 AM   Specimen: Anterior Nasal Swab  Result Value Ref Range   SARS Coronavirus 2 by RT PCR NEGATIVE NEGATIVE   Influenza A by PCR NEGATIVE NEGATIVE   Influenza B by PCR NEGATIVE NEGATIVE    Comment: (NOTE) The Xpert Xpress SARS-CoV-2/FLU/RSV plus assay is intended as an aid in the diagnosis of influenza from Nasopharyngeal swab specimens and should not be used as a sole basis for treatment. Nasal washings and aspirates are unacceptable for Xpert Xpress SARS-CoV-2/FLU/RSV testing.  Fact Sheet for Patients: BloggerCourse.com  Fact Sheet for Healthcare Providers: SeriousBroker.it  This test is not yet approved or cleared by the [place] FDA and has been authorized for detection and/or diagnosis of SARS-CoV-2 by FDA under an Emergency Use Authorization (EUA). This EUA will remain in effect (meaning this test can be used) for the duration of the COVID-19 declaration under Section 564(b)(1) of the Act, 21 U.S.C. section 360bbb-3(b)(1),  unless the authorization is terminated or revoked.     Resp Syncytial Virus by PCR NEGATIVE NEGATIVE    Comment: (NOTE) Fact Sheet for  Patients: BloggerCourse.com  Fact Sheet for Healthcare Providers: SeriousBroker.it  This test is not yet approved or cleared by the [place] FDA and has been authorized for detection and/or diagnosis of SARS-CoV-2 by FDA under an Emergency Use Authorization (EUA). This EUA will remain in effect (meaning this test can be used) for the duration of the COVID-19 declaration under Section 564(b)(1) of the Act, 21 U.S.C. section 360bbb-3(b)(1), unless the authorization is terminated or revoked.  Performed at [facility], [address], [place], [place] [address]         Garner Nash, MD, MS

## 2022-09-19 NOTE — Assessment & Plan Note (Signed)
DME nebulizer ordered.

## 2022-09-19 NOTE — Assessment & Plan Note (Signed)
Hypertension: Blood pressure remains elevated despite multiple medications. Patient is on amlodipine 10mg , hydralazine 50 mg TID, metoprolol 25mg  BID, and valsartan 320mg . -Continue current medications. -Increase hydralazine to 100 mg 3 times daily Continue other medications as ordered

## 2022-09-21 ENCOUNTER — Encounter (HOSPITAL_COMMUNITY)
Admission: RE | Admit: 2022-09-21 | Discharge: 2022-09-21 | Disposition: A | Discharge status: Home / Self Care | Payer: Medicaid Other | Source: Ambulatory Visit | Attending: Nurse Practitioner | Admitting: Nurse Practitioner

## 2022-09-21 ENCOUNTER — Encounter: Payer: Self-pay | Admitting: Family Medicine

## 2022-09-21 DIAGNOSIS — R911 Solitary pulmonary nodule: Secondary | ICD-10-CM | POA: Insufficient documentation

## 2022-09-21 LAB — GLUCOSE, CAPILLARY: Glucose-Capillary: 69 mg/dL — ABNORMAL LOW (ref 70–99)

## 2022-09-21 MED ORDER — FLUDEOXYGLUCOSE F - 18 (FDG) INJECTION
7.8000 | Freq: Once | INTRAVENOUS | Status: AC
  Administered 2022-09-21: 7.69 via INTRAVENOUS

## 2022-09-22 ENCOUNTER — Telehealth (HOSPITAL_COMMUNITY): Payer: Self-pay | Admitting: *Deleted

## 2022-09-22 NOTE — Telephone Encounter (Signed)
Patient given detailed instructions per Myocardial Perfusion Study Information Sheet for the test on 09/26/2022 at 10:15. Patient notified to arrive 15 minutes early and that it is imperative to arrive on time for appointment to keep from having the test rescheduled.  If you need to cancel or reschedule your appointment, please call the office within 24 hours of your appointment. . Patient verbalized understanding.Kevin [name]

## 2022-09-25 ENCOUNTER — Ambulatory Visit: Payer: Medicaid Other | Attending: Student

## 2022-09-25 DIAGNOSIS — H547 Unspecified visual loss: Secondary | ICD-10-CM

## 2022-09-25 DIAGNOSIS — I5032 Chronic diastolic (congestive) heart failure: Secondary | ICD-10-CM

## 2022-09-25 DIAGNOSIS — I480 Paroxysmal atrial fibrillation: Secondary | ICD-10-CM

## 2022-09-26 ENCOUNTER — Ambulatory Visit (HOSPITAL_COMMUNITY): Payer: Medicaid Other | Attending: Cardiovascular Disease

## 2022-09-26 DIAGNOSIS — R0609 Other forms of dyspnea: Secondary | ICD-10-CM | POA: Insufficient documentation

## 2022-09-26 LAB — MYOCARDIAL PERFUSION IMAGING
LV dias vol: 176 mL (ref 62–150)
LV sys vol: 101 mL
Nuc Stress EF: 43 %
Peak HR: 100 {beats}/min
Rest HR: 54 {beats}/min
Rest Nuclear Isotope Dose: 10.9 mCi
SDS: 0
SRS: 0
SSS: 0
ST Depression (mm): 0 mm
Stress Nuclear Isotope Dose: 30.4 mCi
TID: 0.97

## 2022-09-26 MED ORDER — REGADENOSON 0.4 MG/5ML IV SOLN
0.4000 mg | Freq: Once | INTRAVENOUS | Status: AC
  Administered 2022-09-26: 0.4 mg via INTRAVENOUS

## 2022-09-26 MED ORDER — TECHNETIUM TC 99M TETROFOSMIN IV KIT
10.9000 | PACK | Freq: Once | INTRAVENOUS | Status: AC | PRN
  Administered 2022-09-26: 10.9 via INTRAVENOUS

## 2022-09-26 MED ORDER — TECHNETIUM TC 99M TETROFOSMIN IV KIT
30.4000 | PACK | Freq: Once | INTRAVENOUS | Status: AC | PRN
  Administered 2022-09-26: 30.4 via INTRAVENOUS

## 2022-09-26 NOTE — Progress Notes (Unsigned)
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  Unusual presentation for amaurosis fugax to be recurrent if due to cardioembolic source.  Would still evaluate for a focal arterial stenosis.  No associated headache, jaw claudication or palpation of temporal artery to suggest temporal arteritis. Hypertension.  Uncontrolled.   Hyperlipidemia Tobacco use disorder   Advised to start ASA 81mg  daily as long as there aren't contraindications in regards to his kidneys (recommended that he discuss with his PCP) Check MRI of brain Check CTA head and neck Smoking cessation Normotensive blood pressure - advised to contact PCP's office ASAP regarding elevated blood pressure Statin therapy Follow up with ophthalmology Follow up with me after testing.    Subjective:  Kevin [name] is a 67 year old right-handed male with COPD, CHF, paroxysmal a fib, AIDS, CKD stage 3, HTN, tobacco abuse, PAD, OSA and history of RCC who presents for left amaurosis fugax.  History supplemented by referring provider's note.    For about 6 months, he has had about 5 episodes of transient vision loss in his left eye.  It looks like a dark film over the eye.  Lasts about 3 or 4 minutes.  No associated headache, eye pain, facial droop or unilateral numbness or weakness.  Bilateral carotid ultrasound on 09/05/2022 revealed 1-39% bilateral ICA stenosis, atypical antegrade flow of the left vertebral artery and normal flow hemodynamics in the bilateral subclavian arteries.  He just finished a 30 day cardiac event monitor.  Results still pending but preliminary report was negative for a fib.  Hgb A1c from 04/28/2022 was 5.1 (not above 5.4 over the last 4 years).  LDL from 07/06/2022 was 110.  Last 2D echo from  12/27/2021 revealed LVEF 60-65% with no evidence of valvular disease or atrial septal shunt.  He has an appointment with ophthalmology (Dr. Dione Booze) but not scheduled until September or October.   He has multiple risk factors such as hypertension, CHF, OSA (uses CPAP) and tobacco abuse.  Past medical history lists paroxysmal atrial fibrillation but he denies this diagnosis and has never been on a blood thinner.    Current medications:  amlodipine, metoprolol tartrate, hydralazine, atorvastatin 40mg   PAST MEDICAL HISTORY: 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 INVASIVE CV LAB;  Service: Cardiovascular;  Laterality: N/A;  LOWER EXTREMITY ANGIOGRAPHY N/A 07/17/2016   Procedure: Lower Extremity Angiography;  Surgeon: Runell Gess, MD;  Location: [facility] INVASIVE CV LAB;  Service: Cardiovascular;  Laterality: N/A;   LOWER EXTREMITY INTERVENTION N/A 06/05/2016   Procedure: Lower Extremity Intervention;  Surgeon: Runell Gess, MD;  Location: [facility] INVASIVE CV LAB;  Service:  Cardiovascular;  Laterality: N/A;   PERIPHERAL VASCULAR ATHERECTOMY Right 07/17/2016   Procedure: Peripheral Vascular Atherectomy;  Surgeon: Runell Gess, MD;  Location: [facility] INVASIVE CV LAB;  Service: Cardiovascular;  Laterality: Right;  SFA   PERIPHERAL VASCULAR INTERVENTION  06/05/2016   Procedure: Peripheral Vascular Intervention;  Surgeon: Runell Gess, MD;  Location: [facility] INVASIVE CV LAB;  Service: Cardiovascular;;  left SFA   RADIOLOGY WITH ANESTHESIA Right 06/29/2021   Procedure: RIGHT RENAL CRYOABLATION;  Surgeon: Richarda Overlie, MD;  Location: [facility] ORS;  Service: Anesthesiology;  Laterality: Right;    MEDICATIONS: Current Outpatient Medications on File Prior to Visit  Medication Sig Dispense Refill   acetylcysteine (MUCOMYST) 10 % nebulizer solution Take 4 mLs by nebulization every 4 (four) hours. 30 mL 12   albuterol (VENTOLIN HFA) 108 (90 Base) MCG/ACT inhaler Inhale 2 puffs into the lungs every 4 (four) hours as needed for wheezing or shortness of breath. 54 g 1   amLODipine (NORVASC) 10 MG tablet Take 1 tablet (10 mg total) by mouth daily. 90 tablet 3   aspirin EC 81 MG tablet Take 1 tablet (81 mg total) by mouth daily. Swallow whole. (Patient not taking: Reported on 09/19/2022) 30 tablet 12   atorvastatin (LIPITOR) 40 MG tablet Take 1 tablet (40 mg total) by mouth daily. 60 tablet 3   dolutegravir-lamiVUDine (DOVATO) 50-300 MG tablet Take 1 tablet by mouth daily. 30 tablet 11   doxycycline (VIBRAMYCIN) 100 MG capsule Take 100 mg by mouth 2 (two) times daily.     Fluticasone-Umeclidin-Vilant (TRELEGY ELLIPTA) 200-62.5-25 MCG/ACT AEPB Inhale 1 puff into the lungs daily. 60 each 5   hydrALAZINE (APRESOLINE) 50 MG tablet Take 2 tablets (100 mg total) by mouth 3 (three) times daily. 270 tablet 3   hydrOXYzine (ATARAX) 25 MG tablet Take 25 mg by mouth as needed for anxiety.     ipratropium-albuterol (DUONEB) 0.5-2.5 (3) MG/3ML SOLN Take 3 mLs by nebulization every 6 (six) hours as needed  (wheezing or shortness of breath). (Patient not taking: Reported on 09/19/2022) 120 mL 1   metoprolol tartrate (LOPRESSOR) 25 MG tablet Take 1 tablet (25 mg total) by mouth 2 (two) times daily. 180 tablet 3   montelukast (SINGULAIR) 10 MG tablet Take 1 tablet (10 mg total) by mouth at bedtime. 30 tablet 5   oxyCODONE-acetaminophen (PERCOCET) 7.5-325 MG tablet Take 1 tablet by mouth as needed for moderate pain.     predniSONE (DELTASONE) 20 MG tablet Take 1 tablet (20 mg total) by mouth daily. 10 tablet 0   valsartan (DIOVAN) 320 MG tablet Take 320 mg by mouth daily.     No current facility-administered medications on file prior to visit.    ALLERGIES: No Known Allergies  FAMILY HISTORY: Family History  Problem Relation Age of Onset   High blood pressure Mother    Lupus Mother     Objective:  Blood pressure (!) 180/95, pulse (!) 56, height 6' (1.829 m), weight 159 lb (72.1 kg), SpO2 100%. General: No acute distress.  Patient appears well-groomed.   Head:  Normocephalic/atraumatic Eyes:  fundi examined but not visualized Neck: supple, no paraspinal tenderness, full range of  motion Back: No paraspinal tenderness Heart: regular rate and rhythm Lungs: Clear to auscultation bilaterally. Vascular: No carotid bruits. Neurological Exam: Mental status: alert and oriented to person, place, and time, speech fluent and not dysarthric, language intact. Cranial nerves: CN I: not tested CN II: pupils equal, round and reactive to light, visual fields intact CN III, IV, VI:  full range of motion, no nystagmus, no ptosis CN V: facial sensation intact. CN VII: upper and lower face symmetric CN VIII: hearing intact CN IX, X: gag intact, uvula midline CN XI: sternocleidomastoid and trapezius muscles intact CN XII: tongue midline Bulk & Tone: normal, no fasciculations. Motor:  muscle strength 5/5 throughout Sensation:  Pinprick, temperature and vibratory sensation intact. Deep Tendon Reflexes:   2+ throughout,  toes downgoing.   Finger to nose testing:  Without dysmetria.   Heel to shin:  Without dysmetria.   Gait:  Normal station and stride.  Romberg negative.    Thank you for allowing me to take part in the care of this patient.  Shon Millet, DO  CC: Fanny Bien, MD

## 2022-09-27 ENCOUNTER — Encounter: Payer: Self-pay | Admitting: Neurology

## 2022-09-27 ENCOUNTER — Telehealth: Payer: Self-pay | Admitting: Family Medicine

## 2022-09-27 ENCOUNTER — Ambulatory Visit (INDEPENDENT_AMBULATORY_CARE_PROVIDER_SITE_OTHER): Payer: Medicaid Other | Admitting: Neurology

## 2022-09-27 VITALS — BP 180/95 | HR 56 | Ht 72.0 in | Wt 159.0 lb

## 2022-09-27 DIAGNOSIS — I10 Essential (primary) hypertension: Secondary | ICD-10-CM

## 2022-09-27 DIAGNOSIS — G4733 Obstructive sleep apnea (adult) (pediatric): Secondary | ICD-10-CM | POA: Diagnosis not present

## 2022-09-27 DIAGNOSIS — G453 Amaurosis fugax: Secondary | ICD-10-CM | POA: Diagnosis not present

## 2022-09-27 DIAGNOSIS — E785 Hyperlipidemia, unspecified: Secondary | ICD-10-CM

## 2022-09-27 NOTE — Patient Instructions (Addendum)
Check MRI of brain  Check CTA head and neck Start aspirin 81mg  daily Quit smoking Continue using CPAP Follow up with the eye doctor. Follow up after testing.

## 2022-09-27 NOTE — Telephone Encounter (Signed)
Pt called and stated he need a refill on his blood pressure medication and he said he been waiting atleast 2 weeks now

## 2022-09-28 MED ORDER — VALSARTAN 320 MG PO TABS: 320.0000 mg | ORAL_TABLET | Freq: Every day | ORAL | 1 refills | Status: DC

## 2022-09-28 NOTE — Telephone Encounter (Signed)
Spoke with patient for clarity on which medication he needed refills on. Pt stated valsartan. I sent rx to preferred pharmacy below.

## 2022-09-29 ENCOUNTER — Encounter: Payer: Self-pay | Admitting: Emergency Medicine

## 2022-09-29 ENCOUNTER — Ambulatory Visit: Payer: Medicaid Other | Admitting: Emergency Medicine

## 2022-09-29 VITALS — BP 140/82 | HR 61 | Ht 72.0 in | Wt 160.4 lb

## 2022-09-29 DIAGNOSIS — J439 Emphysema, unspecified: Secondary | ICD-10-CM | POA: Diagnosis not present

## 2022-09-29 DIAGNOSIS — J4489 Other specified chronic obstructive pulmonary disease: Secondary | ICD-10-CM | POA: Diagnosis not present

## 2022-09-29 DIAGNOSIS — R918 Other nonspecific abnormal finding of lung field: Secondary | ICD-10-CM | POA: Diagnosis not present

## 2022-09-29 NOTE — Progress Notes (Signed)
Subjective:    Patient ID: Kevin [name], male    DOB: 11/18/1955, 67 y.o.   MRN: [id number]  HPI 67 year old patient with a history of tobacco use (40+ pk-yrs) and COPD, renal cell cancer, HIV on therapy, hypertension and atrial fibrillation with diastolic CHF, OSA on CPAP, gout.  He has been seen by Dr. Thora Lance in our office for his COPD and also pulmonary nodular disease on a CT chest 11/2020.  Repeat CT scan of the chest was done as below as well as subsequent PET scan.  CT scan of the chest 09/06/2022 reviewed by me shows multiple stable right sided solid and subsolid pulmonary nodules that were stable or regressed.  2 nodules had increased in size including a 7 mm solid right upper lobe nodule, 15 mm mixed density right lower lobe nodule.  This prompted PET scan. He has been experiencing episodes of acute dyspnea, has been back and forth to the ED multiple times this year. Was started on Trelegy in June. Can happen w exertion, chores or at rest. Does not wake him from sleep. He coughs through the day, produces mucous in the am. ? Whether there may be a component of panic / anxiety. Albuterol nebs 3x a day. Unclear triggers. Does have trouble in hot air.   PET scan 09/21/2022 reviewed by me, shows mild hypermetabolism within the mediastinal nodes with SUV max 4.1 of unclear significance.  The right upper lobe peribronchovascular nodules measured at 5 mm.  None of the groundglass nodules had any hypermetabolism.  There was some nonspecific hypermetabolism in the right apex as well as inferior lingula and an area of some minimal airspace disease that was new compared with the CT done 7/24   Review of Systems As per HPI  Past Medical History:  Diagnosis Date   AIDS (acquired immune deficiency syndrome) (HCC) 08/17/2016   Chronic diastolic CHF (congestive heart failure), NYHA class 3 (HCC) 01/2016   Chronic lower back pain    CKD (chronic kidney disease), stage III (HCC)    COPD (chronic  obstructive pulmonary disease) (HCC)    Gout    "forearms, hands, ankles, feet" (06/05/2016)   Headache    "weekly" (06/05/2016)   Heart murmur    Hypertension    Hypertensive crisis 08/15/2017   Lipoma 07/06/2022   OSA on CPAP    PAD (peripheral artery disease) (HCC)    PAF (paroxysmal atrial fibrillation) (HCC) 01/2016   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     Family History  Problem Relation Age of Onset   High blood pressure Mother    Lupus Mother    Seizures Daughter    Heart attack Daughter      Social History   Socioeconomic History   Marital status: Significant Other    Spouse name: Not on file   Number of children: Not on file   Years of education: Not on file   Highest education level: Not on file  Occupational History   Not on file  Tobacco Use   Smoking status: Every Day    Current packs/day: 0.10    Average packs/day: 0.1 packs/day for 42.0 years (4.2 ttl pk-yrs)    Types: Cigarettes, E-cigarettes    Passive exposure: Never   Smokeless tobacco: Never   Tobacco comments:    1-2 cigarettes a day 09/29/22 [place]  Vaping Use   Vaping status: Every Day   Substances: Nicotine, Flavoring  Substance and Sexual Activity   Alcohol use: Not  Currently    Alcohol/week: 2.0 standard drinks of alcohol    Types: 2 Cans of beer per week    Comment: rare   Drug use: Not Currently    Comment: rare   Sexual activity: Not Currently    Comment: declined condoms  Other Topics Concern   Not on file  Social History Narrative   Right handed   Social Determinants of Health   Financial Resource Strain: Not on file  Food Insecurity: No Food Insecurity (08/07/2022)   Hunger Vital Sign    Worried About Running Out of Food in the Last Year: Never true    Ran Out of Food in the Last Year: Never true  Transportation Needs: Unmet Transportation Needs (07/04/2022)   PRAPARE - Administrator, Civil Service (Medical): Yes    Lack of Transportation  (Non-Medical): No  Physical Activity: Not on file  Stress: Stress Concern Present (05/04/2022)   [employer] of Occupational Health - Occupational Stress Questionnaire    Feeling of Stress : To some extent  Social Connections: Not on file  Intimate Partner Violence: Not At Risk (12/27/2021)   Humiliation, Afraid, Rape, and Kick questionnaire    Fear of Current or Ex-Partner: No    Emotionally Abused: No    Physically Abused: No    Sexually Abused: No     No Known Allergies   Outpatient Medications Prior to Visit  Medication Sig Dispense Refill   acetylcysteine (MUCOMYST) 10 % nebulizer solution Take 4 mLs by nebulization every 4 (four) hours. 30 mL 12   albuterol (VENTOLIN HFA) 108 (90 Base) MCG/ACT inhaler Inhale 2 puffs into the lungs every 4 (four) hours as needed for wheezing or shortness of breath. 54 g 1   amLODipine (NORVASC) 10 MG tablet Take 1 tablet (10 mg total) by mouth daily. 90 tablet 3   atorvastatin (LIPITOR) 40 MG tablet Take 1 tablet (40 mg total) by mouth daily. 60 tablet 3   dolutegravir-lamiVUDine (DOVATO) 50-300 MG tablet Take 1 tablet by mouth daily. 30 tablet 11   doxycycline (VIBRAMYCIN) 100 MG capsule Take 100 mg by mouth 2 (two) times daily.     Fluticasone-Umeclidin-Vilant (TRELEGY ELLIPTA) 200-62.5-25 MCG/ACT AEPB Inhale 1 puff into the lungs daily. 60 each 5   hydrALAZINE (APRESOLINE) 50 MG tablet Take 2 tablets (100 mg total) by mouth 3 (three) times daily. 270 tablet 3   hydrOXYzine (ATARAX) 25 MG tablet Take 25 mg by mouth as needed for anxiety.     ipratropium-albuterol (DUONEB) 0.5-2.5 (3) MG/3ML SOLN Take 3 mLs by nebulization every 6 (six) hours as needed (wheezing or shortness of breath). 120 mL 1   metoprolol tartrate (LOPRESSOR) 25 MG tablet Take 1 tablet (25 mg total) by mouth 2 (two) times daily. 180 tablet 3   oxyCODONE-acetaminophen (PERCOCET) 7.5-325 MG tablet Take 1 tablet by mouth as needed for moderate pain.     valsartan (DIOVAN) 320  MG tablet Take 1 tablet (320 mg total) by mouth daily. 90 tablet 1   No facility-administered medications prior to visit.        Objective:   Physical Exam Vitals:   09/29/22 1259  BP: (!) 140/82  Pulse: 61  SpO2: 98%  Weight: 160 lb 6.4 oz (72.8 kg)  Height: 6' (1.829 m)   Gen: Pleasant, well-nourished, in no distress,  normal affect  ENT: No lesions,  mouth clear,  oropharynx clear, no postnasal drip  Neck: No JVD, no stridor  Lungs: No  use of accessory muscles, no crackles or wheezing on normal respiration, no wheeze on forced expiration  Cardiovascular: RRR, heart sounds normal, no murmur or gallops, no peripheral edema  Musculoskeletal: No deformities, no cyanosis or clubbing  Neuro: alert, awake, non focal  Skin: Warm, no lesions or rash      Assessment & Plan:  Lung nodule, multiple Question whether these are inflammatory.  He has a right upper lobe and right lower lobe nodule that looks to have evolved although they are negative on PET scan (below the typical PET scan threshold).  They need interval follow-up.  Next scan in 6 months, January 2025.  COPD with chronic bronchitis and emphysema (HCC)  Please continue Trelegy 1 inhalation once daily.  Rinse and gargle after using. Use your albuterol, either 2 puffs of your inhaler or 1 nebulizer treatment, up to every 4 hours if needed for shortness of breath, chest tightness, wheezing Get your pulmonary function testing in October as planned We will perform a walking oximetry today on room air Based on your COPD you are at moderate to high risk for general anesthesia.  This does not preclude you from having procedures including your upcoming colonoscopy as long as you are not having flaring symptoms requiring additional medication such as steroids or antibiotics. Follow-up with Dr. Delton Coombes in October after your pulmonary function testing so we can review those results together.   Levy Pupa, MD, PhD 09/29/2022,  1:35 PM [facility] [phone] or if no answer before 7:00PM call [phone] For any issues after 7:00PM please call eLink [phone]

## 2022-09-29 NOTE — Assessment & Plan Note (Signed)
Question whether these are inflammatory.  He has a right upper lobe and right lower lobe nodule that looks to have evolved although they are negative on PET scan (below the typical PET scan threshold).  They need interval follow-up.  Next scan in 6 months, January 2025.

## 2022-09-29 NOTE — Patient Instructions (Addendum)
We reviewed your CT scan of the chest and your PET scan today. We will repeat your CT scan of the chest in January 2025 to compare with priors. Please continue Trelegy 1 elation once daily.  Rinse and gargle after using. Use your albuterol, either 2 puffs of your inhaler or 1 nebulizer treatment, up to every 4 hours if needed for shortness of breath, chest tightness, wheezing Get your pulmonary function testing in October as planned We will perform a walking oximetry today on room air Based on your COPD you are at moderate to high risk for general anesthesia.  This does not preclude you from having procedures including your upcoming colonoscopy as long as you are not having flaring symptoms requiring additional medication such as steroids or antibiotics. Follow-up with Dr. Delton Coombes in October after your pulmonary function testing so we can review those results together.

## 2022-09-29 NOTE — Assessment & Plan Note (Signed)
  Please continue Trelegy 1 inhalation once daily.  Rinse and gargle after using. Use your albuterol, either 2 puffs of your inhaler or 1 nebulizer treatment, up to every 4 hours if needed for shortness of breath, chest tightness, wheezing Get your pulmonary function testing in October as planned We will perform a walking oximetry today on room air Based on your COPD you are at moderate to high risk for general anesthesia.  This does not preclude you from having procedures including your upcoming colonoscopy as long as you are not having flaring symptoms requiring additional medication such as steroids or antibiotics. Follow-up with Dr. Delton Coombes in October after your pulmonary function testing so we can review those results together.

## 2022-10-05 ENCOUNTER — Encounter (HOSPITAL_BASED_OUTPATIENT_CLINIC_OR_DEPARTMENT_OTHER): Payer: Self-pay | Admitting: Family

## 2022-10-05 ENCOUNTER — Other Ambulatory Visit (HOSPITAL_COMMUNITY): Payer: Self-pay

## 2022-10-05 ENCOUNTER — Telehealth: Payer: Self-pay

## 2022-10-05 ENCOUNTER — Other Ambulatory Visit: Payer: Self-pay

## 2022-10-05 ENCOUNTER — Telehealth (HOSPITAL_BASED_OUTPATIENT_CLINIC_OR_DEPARTMENT_OTHER): Payer: Self-pay

## 2022-10-05 ENCOUNTER — Ambulatory Visit (INDEPENDENT_AMBULATORY_CARE_PROVIDER_SITE_OTHER): Payer: Medicaid Other | Admitting: Family

## 2022-10-05 VITALS — BP 138/87 | HR 59 | Ht 72.0 in | Wt 158.0 lb

## 2022-10-05 DIAGNOSIS — I10 Essential (primary) hypertension: Secondary | ICD-10-CM

## 2022-10-05 DIAGNOSIS — Z720 Tobacco use: Secondary | ICD-10-CM

## 2022-10-05 DIAGNOSIS — I739 Peripheral vascular disease, unspecified: Secondary | ICD-10-CM

## 2022-10-05 DIAGNOSIS — E785 Hyperlipidemia, unspecified: Secondary | ICD-10-CM

## 2022-10-05 DIAGNOSIS — I1A0 Resistant hypertension: Secondary | ICD-10-CM | POA: Diagnosis not present

## 2022-10-05 DIAGNOSIS — G4733 Obstructive sleep apnea (adult) (pediatric): Secondary | ICD-10-CM

## 2022-10-05 MED ORDER — HYDRALAZINE HCL 100 MG PO TABS: 100.0000 mg | ORAL_TABLET | Freq: Two times a day (BID) | ORAL | Status: DC

## 2022-10-05 MED ORDER — AMLODIPINE BESYLATE-VALSARTAN 10-320 MG PO TABS
1.0000 | ORAL_TABLET | Freq: Every day | ORAL | 5 refills | Status: DC
Start: 2022-10-05 — End: 2022-10-13
  Filled 2022-10-05 – 2022-10-10 (×4): qty 30, 30d supply, fill #0

## 2022-10-05 MED ORDER — HYDRALAZINE HCL 100 MG PO TABS
100.0000 mg | ORAL_TABLET | Freq: Two times a day (BID) | ORAL | 5 refills | Status: DC
Start: 2022-10-05 — End: 2022-10-13

## 2022-10-05 MED ORDER — ATORVASTATIN CALCIUM 40 MG PO TABS
40.0000 mg | ORAL_TABLET | Freq: Every day | ORAL | 5 refills | Status: DC
Start: 2022-10-05 — End: 2022-10-13
  Filled 2022-10-05 – 2022-10-10 (×4): qty 30, 30d supply, fill #0

## 2022-10-05 MED ORDER — AMLODIPINE BESYLATE-VALSARTAN 10-320 MG PO TABS: 1.0000 | ORAL_TABLET | Freq: Every day | ORAL | Status: DC

## 2022-10-05 MED ORDER — METOPROLOL TARTRATE 25 MG PO TABS
25.0000 mg | ORAL_TABLET | Freq: Two times a day (BID) | ORAL | 5 refills | Status: DC
Start: 2022-10-05 — End: 2022-10-13
  Filled 2022-10-05 – 2022-10-10 (×3): qty 60, 30d supply, fill #0

## 2022-10-05 NOTE — Telephone Encounter (Signed)
Received efax from [facility] requesting demographic sheet for referral. I faxed requested document to [phone].

## 2022-10-05 NOTE — Patient Instructions (Addendum)
Medication Instructions:  Your physician has recommended you make the following change in your medication:   Stop: Amlodipine   Stop: Valsartan   Start: Amlodipine- Valsartan 10-320mg  daily   Change: Hydralazine 100mg  twice daily   These are the changes we are making, we will call you to let you know which pharmacy we send it to for bubble packs.    Follow-Up: Follow up as scheduled

## 2022-10-05 NOTE — Telephone Encounter (Signed)
-----   Message from Terrill B sent at 10/05/2022  1:26 PM EDT ----- Please abstract and route to provider.

## 2022-10-05 NOTE — Progress Notes (Signed)
[facility]pertension [facility] Initial Assessment:    Date:  10/10/2022   ID:  Kevin [name], DOB [birth year], MRN [id number]  PCP:  [name], MD  Cardiologist:  Chrystie Nose, MD  Nephrologist:  Referring MD: [name], MD   CC: Hypertension  History of Present Illness:    Kevin [name] is a 67 y.o. male with a hx of HTN, HLD, COPD, OSA on CPAP, HIV, PAD s/p left SFA intervention 07/2016. Here to establish care in the [facility]pert[facility] Clinic.   Follows with Dr. Rennis Golden for general cardiology. Previous cardiac workup includes: Echo 12/2021 LVEF 60-65%, moderate LVH, gr1DD, mildly elevated PASP, no significant valvular abnormalities. Carotid duplex 08/2022 mild nonobstructive 1-39% stenosis bilaterally. Myoview 09/26/22 no ischemia, LVEF 43%. Echo   Kevin [name] was diagnosed with hypertension more than 10 years ago. It has been difficult to control particularly with compliance. Blood pressure checked with arm cuff at home. . he reports tobacco use smokes 1-2 cigarettes per day. For exercise he has no formal routine. he eats at home and outside of the home and does not routinely follow low sodium diet. He does enjoy cooking.   Usually takes his first medications around noon. Often stays up until 3am. Notes over the last 3 years has been home without work. Previously was a Investment banker, operational. Present regimen: Amlodipine 10mg , Hydralazing 100mg  TID, Metoprolol tartrate 25mg  BID, Valsartan 320mg  daily. He is taking Hydralazine BID instead of TID. Upcoming 10/30/22 has sleep consult with pulmonology. Known severe sleep apnea, machine <71 year old, recommended to resume noninvasive ventilator. He is not using currently.   Previous antihypertensives: Coreg - transitioned to cardioselective beta blocker  Past Medical History:  Diagnosis Date   AIDS (acquired immune deficiency syndrome) (HCC) 08/17/2016   Chronic diastolic CHF (congestive heart failure), NYHA class 3 (HCC) 01/2016    Chronic lower back pain    CKD (chronic kidney disease), stage III (HCC)    COPD (chronic obstructive pulmonary disease) (HCC)    Gout    "forearms, hands, ankles, feet" (06/05/2016)   Headache    "weekly" (06/05/2016)   Heart murmur    Hypertension    Hypertensive crisis 08/15/2017   Lipoma 07/06/2022   OSA on CPAP    PAD (peripheral artery disease) (HCC)    PAF (paroxysmal atrial fibrillation) (HCC) 01/2016   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    Past Surgical History:  Procedure Laterality Date   IR RADIOLOGIST EVAL & MGMT  05/31/2021   IR RADIOLOGIST EVAL & MGMT  07/26/2021   LEFT HEART CATH AND CORONARY ANGIOGRAPHY N/A 10/23/2016   Procedure: LEFT HEART CATH AND CORONARY ANGIOGRAPHY;  Surgeon: Marykay Lex, MD;  Location: [facility] INVASIVE CV LAB;  Service: Cardiovascular;  Laterality: N/A;   LOWER EXTREMITY ANGIOGRAPHY N/A 07/17/2016   Procedure: Lower Extremity Angiography;  Surgeon: Runell Gess, MD;  Location: [facility] INVASIVE CV LAB;  Service: Cardiovascular;  Laterality: N/A;   LOWER EXTREMITY INTERVENTION N/A 06/05/2016   Procedure: Lower Extremity Intervention;  Surgeon: Runell Gess, MD;  Location: [facility] INVASIVE CV LAB;  Service: Cardiovascular;  Laterality: N/A;   PERIPHERAL VASCULAR ATHERECTOMY Right 07/17/2016   Procedure: Peripheral Vascular Atherectomy;  Surgeon: Runell Gess, MD;  Location: [facility] INVASIVE CV LAB;  Service: Cardiovascular;  Laterality: Right;  SFA   PERIPHERAL VASCULAR INTERVENTION  06/05/2016   Procedure: Peripheral Vascular Intervention;  Surgeon: Runell Gess, MD;  Location: [facility] INVASIVE CV LAB;  Service:  Cardiovascular;;  left SFA   RADIOLOGY WITH ANESTHESIA Right 06/29/2021   Procedure: RIGHT RENAL CRYOABLATION;  Surgeon: Richarda Overlie, MD;  Location: [facility];  Service: Anesthesiology;  Laterality: Right;    Current Medications: Current Meds  Medication Sig   acetylcysteine (MUCOMYST) 10 % nebulizer solution Take 4 mLs by nebulization  every 4 (four) hours.   albuterol (VENTOLIN HFA) 108 (90 Base) MCG/ACT inhaler Inhale 2 puffs into the lungs every 4 (four) hours as needed for wheezing or shortness of breath.   dolutegravir-lamiVUDine (DOVATO) 50-300 MG tablet Take 1 tablet by mouth daily.   Fluticasone-Umeclidin-Vilant (TRELEGY ELLIPTA) 200-62.5-25 MCG/ACT AEPB Inhale 1 puff into the lungs daily.   hydrOXYzine (ATARAX) 25 MG tablet Take 25 mg by mouth as needed for anxiety.   ipratropium-albuterol (DUONEB) 0.5-2.5 (3) MG/3ML SOLN Take 3 mLs by nebulization every 6 (six) hours as needed (wheezing or shortness of breath).   oxyCODONE-acetaminophen (PERCOCET) 7.5-325 MG tablet Take 1 tablet by mouth as needed for moderate pain.   [DISCONTINUED] amLODipine (NORVASC) 10 MG tablet Take 1 tablet (10 mg total) by mouth daily.   [DISCONTINUED] amLODipine-valsartan (EXFORGE) 10-320 MG tablet Take 1 tablet by mouth daily.   [DISCONTINUED] atorvastatin (LIPITOR) 40 MG tablet Take 1 tablet (40 mg total) by mouth daily.   [DISCONTINUED] doxycycline (VIBRAMYCIN) 100 MG capsule Take 100 mg by mouth 2 (two) times daily.   [DISCONTINUED] hydrALAZINE (APRESOLINE) 100 MG tablet Take 1 tablet (100 mg total) by mouth 2 (two) times daily.   [DISCONTINUED] hydrALAZINE (APRESOLINE) 50 MG tablet Take 2 tablets (100 mg total) by mouth 3 (three) times daily.   [DISCONTINUED] metoprolol tartrate (LOPRESSOR) 25 MG tablet Take 1 tablet (25 mg total) by mouth 2 (two) times daily.   [DISCONTINUED] valsartan (DIOVAN) 320 MG tablet Take 1 tablet (320 mg total) by mouth daily.     Allergies:   Patient has no known allergies.   Social History   Socioeconomic History   Marital status: Significant Other    Spouse name: Not on file   Number of children: Not on file   Years of education: Not on file   Highest education level: Not on file  Occupational History   Not on file  Tobacco Use   Smoking status: Every Day    Current packs/day: 0.10    Average  packs/day: 0.1 packs/day for 42.0 years (4.2 ttl pk-yrs)    Types: Cigarettes, E-cigarettes    Passive exposure: Never   Smokeless tobacco: Never   Tobacco comments:    1-2 cigarettes a day 09/29/22 [place]  Vaping Use   Vaping status: Every Day   Substances: Nicotine, Flavoring  Substance and Sexual Activity   Alcohol use: Not Currently    Alcohol/week: 2.0 standard drinks of alcohol    Types: 2 Cans of beer per week    Comment: rare   Drug use: Not Currently    Comment: rare   Sexual activity: Not Currently    Comment: declined condoms  Other Topics Concern   Not on file  Social History Narrative   Right handed   Social Determinants of Health   Financial Resource Strain: High Risk (10/05/2022)   Overall Financial Resource Strain (CARDIA)    Difficulty of Paying Living Expenses: Hard  Food Insecurity: No Food Insecurity (08/07/2022)   Hunger Vital Sign    Worried About Running Out of Food in the Last Year: Never true    Ran Out of Food in the Last Year: Never true  Transportation Needs: Unmet Transportation Needs (07/04/2022)   PRAPARE - Administrator, Civil Service (Medical): Yes    Lack of Transportation (Non-Medical): No  Physical Activity: Inactive (10/05/2022)   Exercise Vital Sign    Days of Exercise per Week: 0 days    Minutes of Exercise per Session: 0 min  Stress: Stress Concern Present (05/04/2022)   [employer] of Occupational Health - Occupational Stress Questionnaire    Feeling of Stress : To some extent  Social Connections: Not on file     Family History: The patient's family history includes Heart attack in his daughter; High blood pressure in his mother; Lupus in his mother; Seizures in his daughter.  ROS:   Please see the history of present illness.    All other systems reviewed and are negative.  EKGs/Labs/Other Studies Reviewed:         Recent Labs: 04/28/2022: Magnesium 2.0; TSH 1.28 07/25/2022: B Natriuretic Peptide  18.8 09/11/2022: ALT 10; BUN 21; Creatinine, Ser 2.14; Hemoglobin 10.8; Platelets 255; Potassium 4.2; Sodium 137   Recent Lipid Panel    Component Value Date/Time   CHOL 184 07/06/2022 1145   TRIG 116 07/06/2022 1145   HDL 52 07/06/2022 1145   CHOLHDL 3.5 07/06/2022 1145   VLDL 41 (H) 04/04/2019 0458   LDLCALC 110 (H) 07/06/2022 1145    Physical Exam:   VS:  BP 138/87 Comment: right arm  Pulse (!) 59   Ht 6' (1.829 m)   Wt 158 lb (71.7 kg)   BMI 21.43 kg/m  , BMI Body mass index is 21.43 kg/m. GENERAL:  Well appearing HEENT: Pupils equal round and reactive, fundi not visualized, oral mucosa unremarkable NECK:  No jugular venous distention, waveform within normal limits, carotid upstroke brisk and symmetric, no bruits, no thyromegaly LYMPHATICS:  No cervical adenopathy LUNGS:  Clear to auscultation bilaterally HEART:  RRR.  PMI not displaced or sustained,S1 and S2 within normal limits, no S3, no S4, no clicks, no rubs, no murmurs ABD:  Flat, positive bowel sounds normal in frequency in pitch, no bruits, no rebound, no guarding, no midline pulsatile mass, no hepatomegaly, no splenomegaly EXT:  2 plus pulses throughout, no edema, no cyanosis no clubbing SKIN:  No rashes no nodules NEURO:  Cranial nerves II through XII grossly intact, motor grossly intact throughout PSYCH:  Cognitively intact, oriented to person place and time   ASSESSMENT/PLAN:    HTN / Medication management - Poor compliance contributory to uncontrolled hypertension. Antihypertensive regimen limited by renal function. Would avoid diuretic or MRA without nephrology input. Medications sent to [facility] for adherence packaging as these will be delivered to his home.  Stop Amlodipine and Valsartan. Consolidate and start Amlodipine-10-320mg  daily.  Difficulty with TID dosing, adjust to Hydralazine 100mg  BID.  Continue Toprol 25mg  twice daily. Cardioselective beta blocker due to COPD, Coreg previously  discontinued for this reason. Consider transition to Nebivolol at follow up if BP not controlled Will monitor how BP control improves with adherence, consider renal duplex if no improvement to rule out new RAS. Prior renal duplex 04/2016 with no stenosis.  BP markedly different between two arms with R arm 138/87 and L arm 181/94. Would treat based on higher arm. Has upcoming CT head/neck per neurology due to amaurosis fugax which will allow for evaluation of possible subclavian steal. Interestingly, carotid duplex 08/2022 minimal bilateral 1-39% stenosis and normal subclavian flow.   OSA - Encouraged to resume using noninvasive ventilator. Discussed role  in hypertension management.   COPD - Follows with PCP and pulmonology.   CKD - PCP has referred to nephrology. Careful titration of diuretic and antihypertensive.    CAD / HLD - Per general cardiology team.  Tobacco use - Smoking cessation encouraged. Recommend utilization of [id number].   PAD - No new claudication. Follows with [facility].   Screening for Secondary Hypertension:     10/10/2022    9:11 PM  Causes  Renovascular HTN Screened     - Comments 04/2016 renal duplex with no stenosis  Sleep Apnea Screened     - Comments 09/2022 recommended to resume use of nonivasive ventilator  Thyroid Disease Screened     - Comments 04/2022 normal TSH  Cushing's Syndrome N/A     - Comments non cushingoid appearance    Relevant Labs/Studies:    Latest Ref Rng & Units 09/11/2022    6:54 AM 08/17/2022    8:15 AM 08/15/2022    6:59 AM  Basic Labs  Sodium 135 - 145 mmol/L 137  136  138   Potassium 3.5 - 5.1 mmol/L 4.2  5.5  4.4   Creatinine 0.61 - 1.24 mg/dL 1.61  0.96  0.45        Latest Ref Rng & Units 04/28/2022    3:32 PM 07/14/2021    8:41 AM  Thyroid   TSH 0.40 - 4.50 mIU/L 1.28  0.511                   Disposition:    FU with MD/PharmD in 4-6 weeks    Medication Adjustments/Labs and Tests Ordered: Current medicines are reviewed at  length with the patient today.  Concerns regarding medicines are outlined above.  No orders of the defined types were placed in this encounter.  Meds ordered this encounter  Medications   DISCONTD: amLODipine-valsartan (EXFORGE) 10-320 MG tablet    Sig: Take 1 tablet by mouth daily.   DISCONTD: hydrALAZINE (APRESOLINE) 100 MG tablet    Sig: Take 1 tablet (100 mg total) by mouth 2 (two) times daily.   amLODipine-valsartan (EXFORGE) 10-320 MG tablet    Sig: Take 1 tablet by mouth daily.    Dispense:  60 tablet    Refill:  5    For adherence packaging and mail, please!   atorvastatin (LIPITOR) 40 MG tablet    Sig: Take 1 tablet (40 mg total) by mouth daily.    Dispense:  60 tablet    Refill:  5    For adherence packaging and mail, please!   hydrALAZINE (APRESOLINE) 100 MG tablet    Sig: Take 1 tablet (100 mg total) by mouth 2 (two) times daily.    Dispense:  60 tablet    Refill:  5    For adherence packaging and mail, please!   metoprolol tartrate (LOPRESSOR) 25 MG tablet    Sig: Take 1 tablet (25 mg total) by mouth 2 (two) times daily.    Dispense:  60 tablet    Refill:  5    For adherence packaging and mail, please!     Signed, Alver Sorrow, NP  10/10/2022 9:15 PM    [facility]

## 2022-10-05 NOTE — Telephone Encounter (Signed)
Calling patient to let him know that his medications were sent to [facility] for pill packs, message also sent to his infectious disease team to have them do the same, patient will start to receive bubble packs via mail.   No answer, left message.

## 2022-10-06 ENCOUNTER — Other Ambulatory Visit: Payer: Self-pay

## 2022-10-06 ENCOUNTER — Other Ambulatory Visit (HOSPITAL_COMMUNITY): Payer: Self-pay

## 2022-10-06 NOTE — Telephone Encounter (Signed)
Patient returned call to call center and confirmed he got VM and will look for new pill packs.

## 2022-10-06 NOTE — Telephone Encounter (Signed)
2nd call attempt, no answer, left message to call back, left detailed message and asked for call back for confirmation.

## 2022-10-09 ENCOUNTER — Other Ambulatory Visit (HOSPITAL_COMMUNITY): Payer: Self-pay

## 2022-10-10 ENCOUNTER — Other Ambulatory Visit: Payer: Self-pay

## 2022-10-10 ENCOUNTER — Other Ambulatory Visit (HOSPITAL_COMMUNITY): Payer: Self-pay

## 2022-10-10 ENCOUNTER — Encounter (HOSPITAL_BASED_OUTPATIENT_CLINIC_OR_DEPARTMENT_OTHER): Payer: Self-pay | Admitting: Family

## 2022-10-12 ENCOUNTER — Other Ambulatory Visit: Payer: Self-pay

## 2022-10-12 ENCOUNTER — Observation Stay (HOSPITAL_COMMUNITY)
Admission: EM | Admit: 2022-10-12 | Discharge: 2022-10-13 | Disposition: A | Discharge status: Home / Self Care | Payer: Medicaid Other | Attending: Internal Medicine | Admitting: Internal Medicine

## 2022-10-12 ENCOUNTER — Other Ambulatory Visit: Payer: Self-pay | Admitting: Gastroenterology

## 2022-10-12 ENCOUNTER — Encounter (HOSPITAL_COMMUNITY): Payer: Self-pay | Admitting: Emergency Medicine

## 2022-10-12 ENCOUNTER — Emergency Department (HOSPITAL_COMMUNITY): Payer: Medicaid Other

## 2022-10-12 ENCOUNTER — Other Ambulatory Visit (HOSPITAL_BASED_OUTPATIENT_CLINIC_OR_DEPARTMENT_OTHER): Payer: Self-pay

## 2022-10-12 DIAGNOSIS — I480 Paroxysmal atrial fibrillation: Secondary | ICD-10-CM | POA: Diagnosis not present

## 2022-10-12 DIAGNOSIS — N1832 Chronic kidney disease, stage 3b: Secondary | ICD-10-CM | POA: Diagnosis not present

## 2022-10-12 DIAGNOSIS — J9600 Acute respiratory failure, unspecified whether with hypoxia or hypercapnia: Secondary | ICD-10-CM | POA: Insufficient documentation

## 2022-10-12 DIAGNOSIS — Z8509 Personal history of malignant neoplasm of other digestive organs: Secondary | ICD-10-CM | POA: Insufficient documentation

## 2022-10-12 DIAGNOSIS — J9601 Acute respiratory failure with hypoxia: Secondary | ICD-10-CM | POA: Insufficient documentation

## 2022-10-12 DIAGNOSIS — Z21 Asymptomatic human immunodeficiency virus [HIV] infection status: Secondary | ICD-10-CM | POA: Insufficient documentation

## 2022-10-12 DIAGNOSIS — F17210 Nicotine dependence, cigarettes, uncomplicated: Secondary | ICD-10-CM | POA: Diagnosis not present

## 2022-10-12 DIAGNOSIS — Z1152 Encounter for screening for COVID-19: Secondary | ICD-10-CM | POA: Insufficient documentation

## 2022-10-12 DIAGNOSIS — I5032 Chronic diastolic (congestive) heart failure: Secondary | ICD-10-CM | POA: Diagnosis present

## 2022-10-12 DIAGNOSIS — J441 Chronic obstructive pulmonary disease with (acute) exacerbation: Secondary | ICD-10-CM | POA: Diagnosis not present

## 2022-10-12 DIAGNOSIS — I2510 Atherosclerotic heart disease of native coronary artery without angina pectoris: Secondary | ICD-10-CM | POA: Insufficient documentation

## 2022-10-12 DIAGNOSIS — I130 Hypertensive heart and chronic kidney disease with heart failure and stage 1 through stage 4 chronic kidney disease, or unspecified chronic kidney disease: Secondary | ICD-10-CM | POA: Diagnosis not present

## 2022-10-12 DIAGNOSIS — I160 Hypertensive urgency: Secondary | ICD-10-CM | POA: Diagnosis present

## 2022-10-12 DIAGNOSIS — Z79899 Other long term (current) drug therapy: Secondary | ICD-10-CM | POA: Diagnosis not present

## 2022-10-12 DIAGNOSIS — E785 Hyperlipidemia, unspecified: Secondary | ICD-10-CM | POA: Diagnosis not present

## 2022-10-12 DIAGNOSIS — R0602 Shortness of breath: Secondary | ICD-10-CM | POA: Diagnosis present

## 2022-10-12 DIAGNOSIS — B20 Human immunodeficiency virus [HIV] disease: Secondary | ICD-10-CM | POA: Insufficient documentation

## 2022-10-12 DIAGNOSIS — N183 Chronic kidney disease, stage 3 unspecified: Secondary | ICD-10-CM | POA: Diagnosis present

## 2022-10-12 DIAGNOSIS — I1A0 Resistant hypertension: Secondary | ICD-10-CM

## 2022-10-12 LAB — CBC WITH DIFFERENTIAL/PLATELET
Abs Immature Granulocytes: 0.05 10*3/uL (ref 0.00–0.07)
Basophils Absolute: 0.1 10*3/uL (ref 0.0–0.1)
Basophils Relative: 1 %
Eosinophils Absolute: 0.8 10*3/uL — ABNORMAL HIGH (ref 0.0–0.5)
Eosinophils Relative: 8 %
HCT: 32.8 % — ABNORMAL LOW (ref 39.0–52.0)
Hemoglobin: 11.1 g/dL — ABNORMAL LOW (ref 13.0–17.0)
Immature Granulocytes: 1 %
Lymphocytes Relative: 23 %
Lymphs Abs: 2.5 10*3/uL (ref 0.7–4.0)
MCH: 31.9 pg (ref 26.0–34.0)
MCHC: 33.8 g/dL (ref 30.0–36.0)
MCV: 94.3 fL (ref 80.0–100.0)
Monocytes Absolute: 0.8 10*3/uL (ref 0.1–1.0)
Monocytes Relative: 7 %
Neutro Abs: 6.8 10*3/uL (ref 1.7–7.7)
Neutrophils Relative %: 60 %
Platelets: 331 10*3/uL (ref 150–400)
RBC: 3.48 MIL/uL — ABNORMAL LOW (ref 4.22–5.81)
RDW: 15.1 % (ref 11.5–15.5)
WBC: 11 10*3/uL — ABNORMAL HIGH (ref 4.0–10.5)
nRBC: 0 % (ref 0.0–0.2)

## 2022-10-12 LAB — BASIC METABOLIC PANEL
Anion gap: 13 (ref 5–15)
BUN: 23 mg/dL (ref 8–23)
CO2: 20 mmol/L — ABNORMAL LOW (ref 22–32)
Calcium: 9.4 mg/dL (ref 8.9–10.3)
Chloride: 107 mmol/L (ref 98–111)
Creatinine, Ser: 1.78 mg/dL — ABNORMAL HIGH (ref 0.61–1.24)
GFR, Estimated: 42 mL/min — ABNORMAL LOW (ref >60)
Glucose, Bld: 89 mg/dL (ref 70–99)
Potassium: 4 mmol/L (ref 3.5–5.1)
Sodium: 140 mmol/L (ref 135–145)

## 2022-10-12 LAB — RESP PANEL BY RT-PCR (RSV, FLU A&B, COVID)  RVPGX2
Influenza A by PCR: NEGATIVE
Influenza B by PCR: NEGATIVE
Resp Syncytial Virus by PCR: NEGATIVE
SARS Coronavirus 2 by RT PCR: NEGATIVE

## 2022-10-12 NOTE — ED Triage Notes (Signed)
Pt BIB [facility] for resp distress, hx COPD, CFH, emphysema, pt arrives on CPAP with EDP and RT at bedside, pt given Solumedrol, Mag, 0.5 Atrovent, 5mg  Albuterol en route, v/s upon arrival 220/130, hx of HTN, NSR, 76 HR, 99%, RR 24

## 2022-10-12 NOTE — ED Provider Notes (Signed)
[place] EMERGENCY DEPARTMENT AT [facility] Provider Note   CSN: [id number] Arrival date & time: 10/12/22  2244     History  Chief Complaint  Patient presents with   Respiratory Distress    Kevin [name] is a 67 y.o. male.  The history is provided by the patient and medical records.   67 year old male with history of HIV, renal insufficiency, CHF, COPD, hypertension, PAF not on anticoagulation, PAD, presenting to the ED via EMS for SOB.  Patient has been having difficulty breathing for a few days, called tonight due to acute worsening symptoms.  EMS gave solu-medrol, Mg+, albuterol, atrovent, and started on CPAP with some improvement.  Denies any recent cough/fever/chills.  No sick contacts.  Has had prior admissions for similar.    Home Medications Prior to Admission medications   Medication Sig Start Date End Date Taking? Authorizing Provider  acetylcysteine (MUCOMYST) 10 % nebulizer solution Take 4 mLs by nebulization every 4 (four) hours. 09/19/22   [name], MD  albuterol (VENTOLIN HFA) 108 (90 Base) MCG/ACT inhaler Inhale 2 puffs into the lungs every 4 (four) hours as needed for wheezing or shortness of breath. 07/25/22   Tilden Fossa, MD  amLODipine-valsartan (EXFORGE) 10-320 MG tablet Take 1 tablet by mouth daily. 10/05/22   Alver Sorrow, NP  atorvastatin (LIPITOR) 40 MG tablet Take 1 tablet (40 mg total) by mouth daily. 10/05/22   Alver Sorrow, NP  dolutegravir-lamiVUDine (DOVATO) 50-300 MG tablet Take 1 tablet by mouth daily. 07/06/22   Randall Hiss, MD  Fluticasone-Umeclidin-Vilant (TRELEGY ELLIPTA) 200-62.5-25 MCG/ACT AEPB Inhale 1 puff into the lungs daily. 09/08/22   Cobb, Ruby Cola, NP  hydrALAZINE (APRESOLINE) 100 MG tablet Take 1 tablet (100 mg total) by mouth 2 (two) times daily. 10/05/22   Alver Sorrow, NP  hydrOXYzine (ATARAX) 25 MG tablet Take 25 mg by mouth as needed for anxiety. 08/21/22   [provider]   ipratropium-albuterol (DUONEB) 0.5-2.5 (3) MG/3ML SOLN Take 3 mLs by nebulization every 6 (six) hours as needed (wheezing or shortness of breath). 08/17/22   Gwyneth Sprout, MD  metoprolol tartrate (LOPRESSOR) 25 MG tablet Take 1 tablet (25 mg total) by mouth 2 (two) times daily. 10/05/22 01/03/23  Alver Sorrow, NP  oxyCODONE-acetaminophen (PERCOCET) 7.5-325 MG tablet Take 1 tablet by mouth as needed for moderate pain. 06/22/22   [provider]      Allergies    Patient has no known allergies.    Review of Systems   Review of Systems  Respiratory:  Positive for shortness of breath.   All other systems reviewed and are negative.   Physical Exam Updated Vital Signs BP (!) 223/118   Pulse 75   Temp (!) 97 F (36.1 C) (Axillary)   Resp (!) 24   Ht 6' (1.829 m)   Wt 71.7 kg   SpO2 100%   BMI 21.43 kg/m   Physical Exam Vitals and nursing note reviewed.  Constitutional:      Appearance: He is well-developed.  HENT:     Head: Normocephalic and atraumatic.  Eyes:     Conjunctiva/sclera: Conjunctivae normal.     Pupils: Pupils are equal, round, and reactive to light.  Cardiovascular:     Rate and Rhythm: Normal rate and regular rhythm.     Heart sounds: Normal heart sounds.  Pulmonary:     Effort: Tachypnea, accessory muscle usage and retractions present.     Breath sounds:  Normal breath sounds.     Comments: Increased WOB, lungs grossly clear Abdominal:     General: Bowel sounds are normal.     Palpations: Abdomen is soft.     Tenderness: There is no abdominal tenderness. There is no rebound.  Musculoskeletal:        General: Normal range of motion.     Cervical back: Normal range of motion.  Skin:    General: Skin is warm and dry.  Neurological:     Mental Status: He is alert and oriented to person, place, and time.     ED Results / Procedures / Treatments   Labs (all labs ordered are listed, but only abnormal results are displayed) Labs Reviewed   CBC WITH DIFFERENTIAL/PLATELET - Abnormal; Notable for the following components:      Result Value   WBC 11.0 (*)    RBC 3.48 (*)    Hemoglobin 11.1 (*)    HCT 32.8 (*)    Eosinophils Absolute 0.8 (*)    All other components within normal limits  BASIC METABOLIC PANEL - Abnormal; Notable for the following components:   CO2 20 (*)    Creatinine, Ser 1.78 (*)    GFR, Estimated 42 (*)    All other components within normal limits  TROPONIN I (HIGH SENSITIVITY) - Abnormal; Notable for the following components:   Troponin I (High Sensitivity) 43 (*)    All other components within normal limits  TROPONIN I (HIGH SENSITIVITY) - Abnormal; Notable for the following components:   Troponin I (High Sensitivity) 42 (*)    All other components within normal limits  RESP PANEL BY RT-PCR (RSV, FLU A&B, COVID)  [id number]    EKG None  Radiology No results found.  Procedures Procedures    CRITICAL CARE Performed by: Garlon Hatchet   Total critical care time: 45 minutes  Critical care time was exclusive of separately billable procedures and treating other patients.  Critical care was necessary to treat or prevent imminent or life-threatening deterioration.  Critical care was time spent personally by me on the following activities: development of treatment plan with patient and/or surrogate as well as nursing, discussions with consultants, evaluation of patient's response to treatment, examination of patient, obtaining history from patient or surrogate, ordering and performing treatments and interventions, ordering and review of laboratory studies, ordering and review of radiographic studies, pulse oximetry and re-evaluation of patient's condition.   Medications Ordered in ED Medications  albuterol (PROVENTIL,VENTOLIN) solution continuous neb (has no administration in time range)  hydrALAZINE (APRESOLINE) tablet 100 mg (100 mg Oral Given 10/13/22 0009)  metoprolol tartrate (LOPRESSOR) tablet  25 mg (25 mg Oral Given 10/13/22 0009)  hydrOXYzine (ATARAX) tablet 25 mg (25 mg Oral Given 10/13/22 0009)  albuterol (PROVENTIL) (2.5 MG/3ML) 0.083% nebulizer solution 5 mg (5 mg Nebulization Given 10/13/22 0056)  ipratropium (ATROVENT) nebulizer solution 0.5 mg (0.5 mg Nebulization Given 10/13/22 0056)    ED Course/ Medical Decision Making/ A&P                                 Medical Decision Making Amount and/or Complexity of Data Reviewed Labs: ordered. Radiology: ordered and independent interpretation performed. ECG/medicine tests: ordered and independent interpretation performed.  Risk Prescription drug management. Decision regarding hospitalization.   67 year old male presenting to the ED with shortness of breath.  History of COPD and emphysema, no relief with home meds.  He  has had Solu-Medrol, magnesium, and started on CPAP by EMS.  Transition to BiPAP on arrival and is tolerating well.  He is hypertensive here.  EKG without acute ischemic changes.  Labs ordered along with RVP and chest x-ray.  Labs as above--no leukocytosis.  SrCr 1.78, appears baseline compared with prior.  RVP is negative.  CXR is clear.    12:00 AM Patient began panicking and ripped of bipap.  He is not hypoxic but has worsening RR when talking.  He now tells me that he has not had any of his BP medications today and is starting to have chest pain.  Also has not had his anxiety medications.  He is refusing to wear bipap now but is agreeable to nasal canula.  Will order home meds, add troponin, observe for now.  12:49 AM Feeling more SOB again, agreeable to go back on bipap.  Will order albuterol treatments to run through bipap mask.  2:23 AM After neb patient feeling better.  Decided against bipap replacement.  He is sleeping comfortably.  Trop elevated at 43, appears chronically elevated.  His BP has improved with home meds.  Will check delta trop to ensure remains flat.  3:50 AM Delta trop flat at 42.   Patient reassessed-- states he is feeling worse again.  He does have increased RR currently, some recurrent wheezing.  He states he does not feel like the nebs are helping.  He does admit he was feeling better on bipap.  I have explained to him that he likely needs to wear this for a prolonged period until he is improving/stabilizes, not meant to be worn for 1 hour and have immediate return to normal.  (He pulled bipap off after about an hour earlier).  We will replace bipap, he is agreeable now.  He will require admission.  4:25 AM Patient now refusing bipap again.  He is argumentative with staff that we are not "helping him" however he is becoming obstructive with treatment.  We have explained reasoning for our treatment plan, he continues to state "I just dont understand" even after myself, RN, and RT have explained to him multiple times.  He continues insisting "we are not doing anything".    Discussed with hospitalist, Dr. Loney Loh-- will admit for ongoing care.  Final Clinical Impression(s) / ED Diagnoses Final diagnoses:  COPD exacerbation [facility])    Rx / DC Orders ED Discharge Orders     None         Garlon Hatchet, PA-C 10/13/22 0531    Gwyneth Sprout, MD 10/13/22 [phone]

## 2022-10-13 DIAGNOSIS — J441 Chronic obstructive pulmonary disease with (acute) exacerbation: Principal | ICD-10-CM

## 2022-10-13 DIAGNOSIS — E785 Hyperlipidemia, unspecified: Secondary | ICD-10-CM

## 2022-10-13 DIAGNOSIS — I1A0 Resistant hypertension: Secondary | ICD-10-CM

## 2022-10-13 LAB — TROPONIN I (HIGH SENSITIVITY)
Troponin I (High Sensitivity): 43 ng/L — ABNORMAL HIGH (ref <18)
Troponin I (High Sensitivity): 42 ng/L — ABNORMAL HIGH (ref <18)

## 2022-10-13 MED ORDER — ACETYLCYSTEINE 20 % IN SOLN
4.0000 mL | RESPIRATORY_TRACT | Status: DC
  Administered 2022-10-13: 4 mL via RESPIRATORY_TRACT
  Filled 2022-10-13 (×8): qty 4

## 2022-10-13 MED ORDER — ACETAMINOPHEN 650 MG RE SUPP: 650.0000 mg | Freq: Four times a day (QID) | RECTAL | Status: DC | PRN

## 2022-10-13 MED ORDER — ATORVASTATIN CALCIUM 40 MG PO TABS: 40.0000 mg | ORAL_TABLET | Freq: Every day | ORAL | 5 refills | Status: DC

## 2022-10-13 MED ORDER — METOPROLOL TARTRATE 25 MG PO TABS
25.0000 mg | ORAL_TABLET | Freq: Once | ORAL | Status: AC
  Administered 2022-10-13: 25 mg via ORAL
  Filled 2022-10-13: qty 1

## 2022-10-13 MED ORDER — AMLODIPINE BESYLATE-VALSARTAN 10-320 MG PO TABS: 1.0000 | ORAL_TABLET | Freq: Every day | ORAL | Status: DC

## 2022-10-13 MED ORDER — PREDNISONE 10 MG PO TABS: ORAL_TABLET | ORAL | 0 refills | Status: DC

## 2022-10-13 MED ORDER — IPRATROPIUM BROMIDE 0.02 % IN SOLN
0.5000 mg | Freq: Once | RESPIRATORY_TRACT | Status: AC
  Administered 2022-10-13: 0.5 mg via RESPIRATORY_TRACT
  Filled 2022-10-13: qty 2.5

## 2022-10-13 MED ORDER — ATORVASTATIN CALCIUM 40 MG PO TABS
40.0000 mg | ORAL_TABLET | Freq: Every day | ORAL | Status: DC
  Administered 2022-10-13: 40 mg via ORAL
  Filled 2022-10-13: qty 1

## 2022-10-13 MED ORDER — ARFORMOTEROL TARTRATE 15 MCG/2ML IN NEBU
15.0000 ug | INHALATION_SOLUTION | Freq: Two times a day (BID) | RESPIRATORY_TRACT | Status: DC
  Administered 2022-10-13: 15 ug via RESPIRATORY_TRACT
  Filled 2022-10-13: qty 2

## 2022-10-13 MED ORDER — BUDESONIDE 0.25 MG/2ML IN SUSP
0.2500 mg | Freq: Two times a day (BID) | RESPIRATORY_TRACT | Status: DC
  Filled 2022-10-13: qty 2

## 2022-10-13 MED ORDER — ACETAMINOPHEN 325 MG PO TABS: 650.0000 mg | ORAL_TABLET | Freq: Four times a day (QID) | ORAL | Status: DC | PRN

## 2022-10-13 MED ORDER — ENOXAPARIN SODIUM 40 MG/0.4ML IJ SOSY: 40.0000 mg | PREFILLED_SYRINGE | INTRAMUSCULAR | Status: DC

## 2022-10-13 MED ORDER — IPRATROPIUM-ALBUTEROL 0.5-2.5 (3) MG/3ML IN SOLN: 3.0000 mL | Freq: Four times a day (QID) | RESPIRATORY_TRACT | Status: DC

## 2022-10-13 MED ORDER — DOXYCYCLINE HYCLATE 100 MG PO TABS
100.0000 mg | ORAL_TABLET | Freq: Two times a day (BID) | ORAL | Status: DC
  Administered 2022-10-13: 100 mg via ORAL
  Filled 2022-10-13: qty 1

## 2022-10-13 MED ORDER — ALBUTEROL SULFATE (2.5 MG/3ML) 0.083% IN NEBU
10.0000 mg/h | INHALATION_SOLUTION | Freq: Once | RESPIRATORY_TRACT | Status: AC
  Administered 2022-10-13: 10 mg/h via RESPIRATORY_TRACT
  Filled 2022-10-13: qty 12

## 2022-10-13 MED ORDER — HYDRALAZINE HCL 25 MG PO TABS
100.0000 mg | ORAL_TABLET | Freq: Once | ORAL | Status: AC
  Administered 2022-10-13: 100 mg via ORAL
  Filled 2022-10-13: qty 4

## 2022-10-13 MED ORDER — METOPROLOL TARTRATE 25 MG PO TABS
25.0000 mg | ORAL_TABLET | Freq: Two times a day (BID) | ORAL | 5 refills | Status: AC
Start: 2022-10-13 — End: 2023-01-11

## 2022-10-13 MED ORDER — DOLUTEGRAVIR-LAMIVUDINE 50-300 MG PO TABS
1.0000 | ORAL_TABLET | Freq: Every day | ORAL | Status: DC
  Administered 2022-10-13: 1 via ORAL
  Filled 2022-10-13: qty 1

## 2022-10-13 MED ORDER — AMLODIPINE BESYLATE-VALSARTAN 10-320 MG PO TABS: 1.0000 | ORAL_TABLET | Freq: Every day | ORAL | 5 refills | Status: DC

## 2022-10-13 MED ORDER — ALBUTEROL SULFATE (2.5 MG/3ML) 0.083% IN NEBU
5.0000 mg | INHALATION_SOLUTION | Freq: Once | RESPIRATORY_TRACT | Status: AC
  Administered 2022-10-13: 5 mg via RESPIRATORY_TRACT
  Filled 2022-10-13: qty 6

## 2022-10-13 MED ORDER — HYDRALAZINE HCL 50 MG PO TABS
100.0000 mg | ORAL_TABLET | Freq: Two times a day (BID) | ORAL | Status: DC
  Administered 2022-10-13: 100 mg via ORAL
  Filled 2022-10-13: qty 2

## 2022-10-13 MED ORDER — ALBUTEROL SULFATE (2.5 MG/3ML) 0.083% IN NEBU
2.5000 mg | INHALATION_SOLUTION | RESPIRATORY_TRACT | Status: DC | PRN
  Administered 2022-10-13: 2.5 mg via RESPIRATORY_TRACT
  Filled 2022-10-13: qty 3

## 2022-10-13 MED ORDER — IRBESARTAN 300 MG PO TABS
300.0000 mg | ORAL_TABLET | Freq: Every day | ORAL | Status: DC
  Administered 2022-10-13: 300 mg via ORAL
  Filled 2022-10-13: qty 1

## 2022-10-13 MED ORDER — IPRATROPIUM-ALBUTEROL 0.5-2.5 (3) MG/3ML IN SOLN
3.0000 mL | Freq: Four times a day (QID) | RESPIRATORY_TRACT | Status: DC
  Administered 2022-10-13: 3 mL via RESPIRATORY_TRACT
  Filled 2022-10-13: qty 3

## 2022-10-13 MED ORDER — AMLODIPINE BESYLATE 10 MG PO TABS
10.0000 mg | ORAL_TABLET | Freq: Every day | ORAL | Status: DC
  Administered 2022-10-13: 10 mg via ORAL
  Filled 2022-10-13: qty 2

## 2022-10-13 MED ORDER — HYDROXYZINE HCL 25 MG PO TABS
25.0000 mg | ORAL_TABLET | Freq: Once | ORAL | Status: AC
  Administered 2022-10-13: 25 mg via ORAL
  Filled 2022-10-13: qty 1

## 2022-10-13 MED ORDER — HYDRALAZINE HCL 100 MG PO TABS
100.0000 mg | ORAL_TABLET | Freq: Three times a day (TID) | ORAL | 5 refills | Status: AC
Start: 2022-10-13 — End: ?

## 2022-10-13 MED ORDER — HYDRALAZINE HCL 50 MG PO TABS
100.0000 mg | ORAL_TABLET | Freq: Three times a day (TID) | ORAL | Status: DC
  Filled 2022-10-13: qty 2

## 2022-10-13 MED ORDER — METHYLPREDNISOLONE SODIUM SUCC 125 MG IJ SOLR
80.0000 mg | INTRAMUSCULAR | Status: DC
  Administered 2022-10-13: 80 mg via INTRAVENOUS
  Filled 2022-10-13: qty 2

## 2022-10-13 MED ORDER — DOXYCYCLINE HYCLATE 100 MG PO TABS: 100.0000 mg | ORAL_TABLET | Freq: Two times a day (BID) | ORAL | 0 refills | Status: AC

## 2022-10-13 MED ORDER — METOPROLOL TARTRATE 25 MG PO TABS
25.0000 mg | ORAL_TABLET | Freq: Two times a day (BID) | ORAL | Status: DC
  Administered 2022-10-13: 25 mg via ORAL
  Filled 2022-10-13: qty 1

## 2022-10-13 NOTE — H&P (Signed)
History and Physical    Kevin [name] ZOX:[id number] DOB: 1955-10-09 DOA: 10/12/2022  PCP: [name], MD  Patient coming from: Home  Chief Complaint: Shortness of breath  HPI: Kevin [name] is a 67 y.o. male with medical history significant of uncontrolled hypertension secondary to medication noncompliance, cocaine abuse, CAD, chronic HFpEF, paroxysmal A-fib, hyperlipidemia, COPD with chronic bronchitis and emphysema, lung nodules, cigarette smoking, OSA noncompliant with CPAP, HIV, PAD status post left SFA intervention in 2018, CKD stage IIIb, renal cell carcinoma presented to the ED via EMS for evaluation of respiratory distress.  Arrived on CPAP.  He was given Solu-Medrol, IV mag, Atrovent, and albuterol en route.  Blood pressure 220/130 on arrival.  Afebrile.  Labs notable for WBC 11.0, hemoglobin 11.1 (stable), bicarb 20, creatinine 1.7 (stable), COVID/influenza/RSV PCR negative, troponin 43> 42.  Chest x-ray showing no active disease. Patient was given albuterol and ipratropium nebs.  Placed on BiPAP in the ED after which his respiratory status initially improved but then patient took off BiPAP and refused to wear it again.  Had increased work of breathing and wheezing, albuterol continuous neb treatment given.  He was given oral hydralazine and metoprolol after which his blood pressure improved.  He was also given hydroxyzine for anxiety.  TRH called to admit.  Patient states he started having shortness of breath and wheezing yesterday morning.  Also coughing.  States he is using his home inhalers without much improvement.  He is using Trelegy and albuterol as needed.  He reports compliance with his home blood pressure medications including amlodipine, valsartan, hydralazine, and metoprolol except missed taking these medications yesterday.  Denies fevers.  States he was having some chest pain earlier when he was struggling to breathe but it has now resolved after his breathing  improved.  He denies current cocaine use.  States he was previously smoking up to 2.5 packs of cigarettes daily but has now cut down to about 1 cigarette a day.  Review of Systems:  Review of Systems  All other systems reviewed and are negative.   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    Past Surgical History:  Procedure Laterality Date   IR RADIOLOGIST EVAL & MGMT  05/31/2021   IR RADIOLOGIST EVAL & MGMT  07/26/2021   LEFT HEART CATH AND CORONARY ANGIOGRAPHY N/A 10/23/2016   Procedure: LEFT HEART CATH AND CORONARY ANGIOGRAPHY;  Surgeon: Marykay Lex, MD;  Location: [facility] INVASIVE CV LAB;  Service: Cardiovascular;  Laterality: N/A;   LOWER EXTREMITY ANGIOGRAPHY N/A 07/17/2016   Procedure: Lower Extremity Angiography;  Surgeon: Runell Gess, MD;  Location: [facility] INVASIVE CV LAB;  Service: Cardiovascular;  Laterality: N/A;   LOWER EXTREMITY INTERVENTION N/A 06/05/2016   Procedure: Lower Extremity Intervention;  Surgeon: Runell Gess, MD;  Location: [facility] INVASIVE CV LAB;  Service: Cardiovascular;  Laterality: N/A;   PERIPHERAL VASCULAR ATHERECTOMY Right 07/17/2016   Procedure: Peripheral Vascular Atherectomy;  Surgeon: Runell Gess, MD;  Location: [facility] INVASIVE CV LAB;  Service: Cardiovascular;  Laterality: Right;  SFA   PERIPHERAL VASCULAR INTERVENTION  06/05/2016   Procedure:  Peripheral Vascular Intervention;  Surgeon: Runell Gess, MD;  Location: [facility] INVASIVE CV LAB;  Service: Cardiovascular;;  left SFA   RADIOLOGY WITH ANESTHESIA Right 06/29/2021   Procedure: RIGHT RENAL CRYOABLATION;  Surgeon: Richarda Overlie, MD;  Location: [facility] ORS;  Service: Anesthesiology;  Laterality: Right;     reports that he has been smoking cigarettes and e-cigarettes. He has a 4.2 pack-year smoking history. He has never been exposed to tobacco smoke. He has never used smokeless tobacco. He reports that he does not currently use alcohol after a past usage of about 2.0 standard drinks of alcohol per week. He reports that he does not currently use drugs.  No Known Allergies  Family History  Problem Relation Age of Onset   High blood pressure Mother    Lupus Mother    Seizures Daughter    Heart attack Daughter     Prior to Admission medications   Medication Sig Start Date End Date Taking? Authorizing Provider  acetylcysteine (MUCOMYST) 10 % nebulizer solution Take 4 mLs by nebulization every 4 (four) hours. 09/19/22   [name], MD  albuterol (VENTOLIN HFA) 108 (90 Base) MCG/ACT inhaler Inhale 2 puffs into the lungs every 4 (four) hours as needed for wheezing or shortness of breath. 07/25/22   Tilden Fossa, MD  amLODipine-valsartan (EXFORGE) 10-320 MG tablet Take 1 tablet by mouth daily. 10/05/22   Alver Sorrow, NP  atorvastatin (LIPITOR) 40 MG tablet Take 1 tablet (40 mg total) by mouth daily. 10/05/22   Alver Sorrow, NP  dolutegravir-lamiVUDine (DOVATO) 50-300 MG tablet Take 1 tablet by mouth daily. 07/06/22   Randall Hiss, MD  Fluticasone-Umeclidin-Vilant (TRELEGY ELLIPTA) 200-62.5-25 MCG/ACT AEPB Inhale 1 puff into the lungs daily. 09/08/22   Cobb, Ruby Cola, NP  hydrALAZINE (APRESOLINE) 100 MG tablet Take 1 tablet (100 mg total) by mouth 2 (two) times daily. 10/05/22   Alver Sorrow, NP  hydrOXYzine (ATARAX) 25 MG tablet Take 25 mg by mouth as needed for  anxiety. 08/21/22   [provider]  ipratropium-albuterol (DUONEB) 0.5-2.5 (3) MG/3ML SOLN Take 3 mLs by nebulization every 6 (six) hours as needed (wheezing or shortness of breath). 08/17/22   Gwyneth Sprout, MD  metoprolol tartrate (LOPRESSOR) 25 MG tablet Take 1 tablet (25 mg total) by mouth 2 (two) times daily. 10/05/22 01/03/23  Alver Sorrow, NP  oxyCODONE-acetaminophen (PERCOCET) 7.5-325 MG tablet Take 1 tablet by mouth as needed for moderate pain. 06/22/22   [provider]    Physical Exam: Vitals:   10/13/22 0215 10/13/22 0230 10/13/22 0245 10/13/22 0338  BP: 138/71 (!) 165/89 (!) 146/80 (!) 147/81  Pulse: 65 63 69 82  Resp: (!) 23 (!) 21  20  Temp:    98 F (36.7 C)  TempSrc:    Oral  SpO2: 94% 99% 100% 99%  Weight:      Height:        Physical Exam Vitals reviewed.  Constitutional:      General: He is not in acute distress. HENT:     Head: Normocephalic and atraumatic.  Eyes:     Extraocular Movements: Extraocular movements intact.  Cardiovascular:     Rate and  Rhythm: Normal rate and regular rhythm.     Pulses: Normal pulses.  Pulmonary:     Effort: No respiratory distress.     Breath sounds: No rales.     Comments: Mildly increased work of breathing Able to speak in full sentences Mild wheezing Abdominal:     General: Bowel sounds are normal. There is no distension.     Palpations: Abdomen is soft.     Tenderness: There is no abdominal tenderness.  Musculoskeletal:     Cervical back: Normal range of motion.     Right lower leg: No edema.     Left lower leg: No edema.  Skin:    General: Skin is warm and dry.  Neurological:     General: No focal deficit present.     Mental Status: He is alert and oriented to person, place, and time.     Labs on Admission: I have personally reviewed following labs and imaging studies  CBC: Recent Labs  Lab 10/12/22 2255  WBC 11.0*  NEUTROABS 6.8  HGB 11.1*  HCT 32.8*  MCV 94.3  PLT 331    Basic Metabolic Panel: Recent Labs  Lab 10/12/22 2255  NA 140  K 4.0  CL 107  CO2 20*  GLUCOSE 89  BUN 23  CREATININE 1.78*  CALCIUM 9.4   GFR: Estimated Creatinine Clearance: 42.5 mL/min (A) (by C-G formula based on SCr of 1.78 mg/dL (H)). Liver Function Tests: No results for input(s): "AST", "ALT", "ALKPHOS", "BILITOT", "PROT", "ALBUMIN" in the last 168 hours. No results for input(s): "LIPASE", "AMYLASE" in the last 168 hours. No results for input(s): "AMMONIA" in the last 168 hours. Coagulation Profile: No results for input(s): "INR", "PROTIME" in the last 168 hours. Cardiac Enzymes: No results for input(s): "CKTOTAL", "CKMB", "CKMBINDEX", "TROPONINI" in the last 168 hours. BNP (last 3 results) No results for input(s): "PROBNP" in the last 8760 hours. HbA1C: No results for input(s): "HGBA1C" in the last 72 hours. CBG: No results for input(s): "GLUCAP" in the last 168 hours. Lipid Profile: No results for input(s): "CHOL", "HDL", "LDLCALC", "TRIG", "CHOLHDL", "LDLDIRECT" in the last 72 hours. Thyroid Function Tests: No results for input(s): "TSH", "T4TOTAL", "FREET4", "T3FREE", "THYROIDAB" in the last 72 hours. Anemia Panel: No results for input(s): "VITAMINB12", "FOLATE", "FERRITIN", "TIBC", "IRON", "RETICCTPCT" in the last 72 hours. Urine analysis:    Component Value Date/Time   COLORURINE YELLOW 04/28/2022 1532   APPEARANCEUR CLEAR 04/28/2022 1532   LABSPEC 1.014 04/28/2022 1532   PHURINE 5.5 04/28/2022 1532   GLUCOSEU NEGATIVE 04/28/2022 1532   HGBUR NEGATIVE 04/28/2022 1532   BILIRUBINUR NEGATIVE 12/27/2021 0722   KETONESUR NEGATIVE 04/28/2022 1532   PROTEINUR 1+ (A) 04/28/2022 1532   UROBILINOGEN 0.2 01/17/2013 0022   NITRITE NEGATIVE 04/28/2022 1532   LEUKOCYTESUR NEGATIVE 04/28/2022 1532    Radiological Exams on Admission: DG Chest Port 1 View  Result Date: 10/12/2022 CLINICAL DATA:  Shortness of breath.  History of COPD and CHF. EXAM: PORTABLE  CHEST 1 VIEW COMPARISON:  Chest radiograph dated 09/11/2022 and CT dated 09/06/2022. FINDINGS: No focal consolidation, pleural effusion, or pneumothorax. The cardiac silhouette is within normal limits. No acute osseous pathology. IMPRESSION: No active disease. Electronically Signed   By: Elgie Collard M.D.   On: 10/12/2022 23:26    EKG: Independently reviewed.  Sinus rhythm, LVH, anterior Q waves, T wave inversions in V6.  No acute ischemic changes in comparison to previous tracing.  Assessment and Plan  Acute respiratory failure secondary to  acute COPD exacerbation Patient presenting with complaints of shortness breath, wheezing, and cough.  Chest x-ray showing no active disease.  COVID/influenza/RSV PCR negative.  He received Solu-Medrol, IV mag, Atrovent, and albuterol by EMS.  Initially placed on BiPAP in ED with improvement but then patient took off BiPAP and started having increased work of breathing again.  He was given additional bronchodilator treatments in the ED.  Currently receiving hour-long continuous albuterol neb treatment with improvement of wheezing and work of breathing.  Able to speak in full sentences. Continue treatment with Solu-Medrol 40 mg every 12 hours, DuoNeb every 6 hours, and albuterol neb every 2 hours as needed.  Continue Mucomyst neb every 4 hours.  Discussed with the patient that he may need BiPAP again if his work of breathing worsens and he agrees.  Hypertensive urgency Patient states he is taking his medications daily except missed taking them just yesterday.  Denies current cocaine use.  He was given his home oral medications including hydralazine and metoprolol in the ED after which blood pressure improved significantly.  Systolic currently in the 140s.  Continue home hydralazine, metoprolol, amlodipine, and valsartan.  CAD Troponin slightly elevated but stable and not consistent with ACS.  Patient denies active chest pain at this time.  Chronic HFpEF Echo  done in November 2023 showing EF 60 to 65%, grade 1 diastolic dysfunction. No evidence of pulmonary edema on imaging.  Continue to monitor volume status closely.  Paroxysmal A-fib Not on chronic anticoagulation.  Currently in sinus rhythm.  Continue metoprolol.  Hyperlipidemia Continue Lipitor.  HIV Continue Dovato.  CKD stage IIIb Creatinine 1.7, stable.  DVT prophylaxis: Lovenox Code Status: Full Code (discussed with the patient) Family Communication: No family available at this time. Level of care: Progressive Care Unit Admission status: It is my clinical opinion that referral for OBSERVATION is reasonable and necessary in this patient based on the above information provided. The aforementioned taken together are felt to place the patient at high risk for further clinical deterioration. However, it is anticipated that the patient may be medically stable for discharge from the hospital within 24 to 48 hours.  John Giovanni MD [facility]  If 7PM-7AM, please contact night-coverage [email or web address]  10/13/2022, 4:19 AM

## 2022-10-13 NOTE — Discharge Summary (Signed)
Physician Discharge Summary   Patient: Kevin [name] MRN: [id number] DOB: 01/19/1956  Admit date:     10/12/2022  Discharge date: 10/13/22  Discharge Physician: Thad Ranger, MD    PCP: [name], MD   Recommendations at discharge:    Prednisone with taper 40mg  x 3 days, 30mg  x 3 days, 20mg  x 3days, 10mg  x 3 days then off  Doxycycline 100mg  BID x 7days  Hydralazine increased to 100mg  TID   Discharge Diagnoses:   Acute respiratory failure with hypoxia    COPD with acute exacerbation (HCC)   HIV (human immunodeficiency virus infection) (HCC)   Stage 3 chronic kidney disease (HCC)   CAD (coronary artery disease)   Hypertensive urgency   Chronic heart failure with preserved ejection fraction (HFpEF) (HCC)   HLD (hyperlipidemia)   Hospital Course:  Patient is a 67 year old male with uncontrolled hypertension, noncompliance, cocaine use, CAD, chronic HFpEF, paroxysmal A-fib, HLP, COPD, nicotine use, OSA, noncompliant with CPAP, HIV, PAD status post left SFA intervention in 2018, CKD 3B, renal cell ca presented to ED with respiratory distress.  Patient arrived via EMS on CPAP.  He was given IV Solu-Medrol, IV mag, DuoNebs.  He reported cough, shortness of breath and wheezing that started a day before the admission.  He was using home inhalers without much improvement.  COVID/influenza, RSV negative.  WBCs 11.0, BP of 220/130 on arrival, afebrile Chest x-ray negative for any pneumonia.   Patient was placed on BiPAP in ED, respiratory status initially improved and patient took off BiPAP, had increased work of breathing. Patient was admitted for further workup  Assessment and Plan: Acute respiratory failure with hypoxia Acute COPD exacerbation -Presented with acute shortness of breath, wheezing, cough, hypoxia, placed on BiPAP in ED Patient did well without Bipap and was able to quickly wean off O2.  -he was placed on scheduled DuoNebs, IV Solu-Medrol, pulmicort, brovana,  doxycycline, Mucomyst nebs  - Transitioned to prednisone with taper upon DC -home o2 evaluation done, sats remained 96% upon rest and ambulation without O2 and on RA.    Hypertensive urgency -BP in 200s on admission, stated he missed taking his medications a day before the admission -Resumed home medications including hydralazine, metoprolol, amlodipine and losartan -Increase hydralazine to 100 mg 3 times daily   CAD - Troponin slightly elevated but stable and not consistent with ACS.   -Currently no chest pain.     Chronic HFpEF - Echo done in November 2023 showing EF 60 to 65%, grade 1 diastolic dysfunction.  -Euvolemic, no evidence of pulmonary edema on imaging.     Paroxysmal A-fib Not on chronic anticoagulation.   -Currently in sinus rhythm.  Continue metoprolol.   Hyperlipidemia Continue Lipitor.   HIV Continue Dovato.   CKD stage IIIb Creatinine 1.7, stable.   Estimated body mass index is 21.43 kg/m as calculated from the following:   Height as of this encounter: 6' (1.829 m).   Weight as of this encounter: 71.7 kg.        Pain control - [employer] Controlled Substance Reporting System database was reviewed. and patient was instructed, not to drive, operate heavy machinery, perform activities at heights, swimming or participation in water activities or provide baby-sitting services while on Pain, Sleep and Anxiety Medications; until their outpatient Physician has advised to do so again. Also recommended to not to take more than prescribed Pain, Sleep and Anxiety Medications.  Consultants: none  Procedures performed: none   Disposition: Home Diet  recommendation:  Discharge Diet Orders (From admission, onward)     Start     Ordered   10/13/22 0000  Diet - low sodium heart healthy        10/13/22 1719            DISCHARGE MEDICATION: Allergies as of 10/13/2022   No Known Allergies      Medication List     TAKE these medications     acetylcysteine 10 % nebulizer solution Commonly known as: MUCOMYST Take 4 mLs by nebulization every 4 (four) hours.   amLODipine-valsartan 10-320 MG tablet Commonly known as: EXFORGE Take 1 tablet by mouth daily.   atorvastatin 40 MG tablet Commonly known as: LIPITOR Take 1 tablet (40 mg total) by mouth daily.   Dovato 50-300 MG tablet Generic drug: dolutegravir-lamiVUDine Take 1 tablet by mouth daily.   doxycycline 100 MG tablet Commonly known as: VIBRA-TABS Take 1 tablet (100 mg total) by mouth 2 (two) times daily for 7 days.   hydrALAZINE 100 MG tablet Commonly known as: APRESOLINE Take 1 tablet (100 mg total) by mouth 3 (three) times daily. What changed: when to take this   hydrOXYzine 25 MG tablet Commonly known as: ATARAX Take 25 mg by mouth as needed for anxiety.   ipratropium-albuterol 0.5-2.5 (3) MG/3ML Soln Commonly known as: DUONEB Take 3 mLs by nebulization every 6 (six) hours as needed (wheezing or shortness of breath).   metoprolol tartrate 25 MG tablet Commonly known as: LOPRESSOR Take 1 tablet (25 mg total) by mouth 2 (two) times daily.   oxyCODONE-acetaminophen 7.5-325 MG tablet Commonly known as: PERCOCET Take 1 tablet by mouth 3 (three) times daily as needed for moderate pain.   predniSONE 10 MG tablet Commonly known as: DELTASONE Prednisone dosing: Take  Prednisone 40mg  (4 tabs) x 3 days, then taper to 30mg  (3 tabs) x 3 days, then 20mg  (2 tabs) x 3days, then 10mg  (1 tab) x 3days, then OFF.   Trelegy Ellipta 200-62.5-25 MCG/ACT Aepb Generic drug: Fluticasone-Umeclidin-Vilant Inhale 1 puff into the lungs daily.   Ventolin HFA 108 (90 Base) MCG/ACT inhaler Generic drug: albuterol Inhale 2 puffs into the lungs every 4 (four) hours as needed for wheezing or shortness of breath.        Follow-up Information     [name], MD. Schedule an appointment as soon as possible for a visit in 2 week(s).   Specialty: Family Medicine Why: for  hospital follow-up Contact information: [address] [place] [address] [phone]                Discharge Exam: Ceasar Mons Weights   10/12/22 2248 10/13/22 1352  Weight: 71.7 kg 71.7 kg   S feels better and back to baseline, wants to go home, Did well with home O eval.   BP (!) 163/98 (BP Location: Right Arm)   Pulse 76   Temp 97.6 F (36.4 C) (Oral)   Resp 20   Ht 6' (1.829 m)   Wt 71.7 kg   SpO2 95%   BMI 21.44 kg/m   Physical Exam General: Alert and oriented x 3, NAD Cardiovascular: S1 S2 clear, RRR.  Respiratory: CTAB Gastrointestinal: Soft, nontender, nondistended, NBS Ext: no pedal edema bilaterally Psych: Normal affect   Condition at discharge: fair  The results of significant diagnostics from this hospitalization (including imaging, microbiology, ancillary and laboratory) are listed below for reference.   Imaging Studies: DG Chest Port 1 View  Result Date: 10/12/2022 CLINICAL  DATA:  Shortness of breath.  History of COPD and CHF. EXAM: PORTABLE CHEST 1 VIEW COMPARISON:  Chest radiograph dated 09/11/2022 and CT dated 09/06/2022. FINDINGS: No focal consolidation, pleural effusion, or pneumothorax. The cardiac silhouette is within normal limits. No acute osseous pathology. IMPRESSION: No active disease. Electronically Signed   By: Elgie Collard M.D.   On: 10/12/2022 23:26   CARDIAC EVENT MONITOR  Result Date: 10/02/2022 Sinus rhythm with 4 brief episodes of NSVT - PVC's with burden of 1% noted. No afib. Chrystie Nose, MD, [facility], FACP [facility] Medical Director of the [facility] Diplomate of the American Board of Clinical Lipidology Attending Cardiologist Direct Dial: [phone]  Fax: [phone] Website:  [email or web address]  MYOCARDIAL PERFUSION IMAGING  Result Date: 09/26/2022   Normal perfusion. LVEF mildly reduced 43%. Recent echo noted to be normal. Suspect  the value on this study is artifact.   LV perfusion is normal. There is no evidence of ischemia. There is no evidence of infarction.   Left ventricular function is abnormal. Nuclear stress EF: 43%. The left ventricular ejection fraction is moderately decreased (30-44%). End diastolic cavity size is mildly enlarged.   The study is normal. The study is low risk.   NM PET Image Initial (PI) Skull Base To Thigh (F-18 FDG)  Result Date: 09/26/2022 CLINICAL DATA:  Initial treatment strategy for right upper and right lower lobe enlarging pulmonary nodules on CTs. Prior right renal cryoablation. EXAM: NUCLEAR MEDICINE PET SKULL BASE TO THIGH TECHNIQUE: 7.7 mCi F-18 FDG was injected intravenously. Full-ring PET imaging was performed from the skull base to thigh after the radiotracer. CT data was obtained and used for attenuation correction and anatomic localization. Fasting blood glucose: 69 mg/dl COMPARISON:  Chest CTs, most recent 09/06/2022. Abdominal MRI 01/17/2022. FINDINGS: Mediastinal blood pool activity: SUV max 1.6 Liver activity: SUV max NA NECK: No areas of abnormal hypermetabolism. Incidental CT findings: No cervical adenopathy. CHEST: Mild hypermetabolism within mediastinal and right hilar nodes. Example right paratracheal node of 7 mm and a S.U.V. max of 4.1 on 66/4. Right hilar activity without well-defined adenopathy at a S.U.V. max of 4.0. The enlarging right upper lobe peribronchovascular nodule measures 5 mm and is not hypermetabolic, but below PET resolution on 56/4. None of the ground-glass nodules have PET correlate. A focus of hypermetabolism within the anterior right apex is without well-defined correlate nodule. Minimal scarring in this area, measuring a S.U.V. max of 5.5 on 48/4. Hypermetabolism within the inferior lingula, corresponding to minimal airspace disease at a S.U.V. max of 7.2 on 99/4. This is new since the prior CT. Incidental CT findings: Mild centrilobular emphysema.  ABDOMEN/PELVIS: No abdominopelvic parenchymal or nodal hypermetabolism. Incidental CT findings: Normal adrenal glands. Scarring in the right kidney is likely secondary to prior ablation. Abdominal aortic atherosclerosis. SKELETON: No abnormal marrow activity. Incidental CT findings: Femoral head avascular necrosis bilaterally. Right iliac sclerotic lesion is similar to 07/14/2021 at 11 mm favored to represent a bone island. IMPRESSION: 1. Enlarging solid and subsolid pulmonary nodules on prior CT are without PET correlate, not unexpected given small size and morphology respectively. Recommend surveillance with chest CT at 3-6 months. 2. Hypermetabolism corresponding to new minimal inferior lingular airspace disease since 07/14/2021, suspicious for interval infection. 3. Focus of hypermetabolism in the anterior right apex, without correlate pulmonary nodule. Indeterminate. This could be secondary to a second focus of relatively occult infection, or tracer embolization. Recommend special attention to this  area on follow-up chest CT. 4. Mildly hypermetabolic thoracic nodes are likely reactive. 5.  Aortic Atherosclerosis (ICD10-I70.0). 6. Femoral avascular necrosis bilaterally. Electronically Signed   By: Jeronimo Greaves M.D.   On: 09/26/2022 09:03    Microbiology: Results for orders placed or performed during the hospital encounter of 10/12/22  Resp panel by RT-PCR (RSV, Flu A&B, Covid) Anterior Nasal Swab     Status: None   Collection Time: 10/12/22 10:55 PM   Specimen: Anterior Nasal Swab  Result Value Ref Range Status   SARS Coronavirus 2 by RT PCR NEGATIVE NEGATIVE Final   Influenza A by PCR NEGATIVE NEGATIVE Final   Influenza B by PCR NEGATIVE NEGATIVE Final    Comment: (NOTE) The Xpert Xpress SARS-CoV-2/FLU/RSV plus assay is intended as an aid in the diagnosis of influenza from Nasopharyngeal swab specimens and should not be used as a sole basis for treatment. Nasal washings and aspirates are  unacceptable for Xpert Xpress SARS-CoV-2/FLU/RSV testing.  Fact Sheet for Patients: BloggerCourse.com  Fact Sheet for Healthcare Providers: SeriousBroker.it  This test is not yet approved or cleared by the [place] FDA and has been authorized for detection and/or diagnosis of SARS-CoV-2 by FDA under an Emergency Use Authorization (EUA). This EUA will remain in effect (meaning this test can be used) for the duration of the COVID-19 declaration under Section 564(b)(1) of the Act, 21 U.S.C. section 360bbb-3(b)(1), unless the authorization is terminated or revoked.     Resp Syncytial Virus by PCR NEGATIVE NEGATIVE Final    Comment: (NOTE) Fact Sheet for Patients: BloggerCourse.com  Fact Sheet for Healthcare Providers: SeriousBroker.it  This test is not yet approved or cleared by the [place] FDA and has been authorized for detection and/or diagnosis of SARS-CoV-2 by FDA under an Emergency Use Authorization (EUA). This EUA will remain in effect (meaning this test can be used) for the duration of the COVID-19 declaration under Section 564(b)(1) of the Act, 21 U.S.C. section 360bbb-3(b)(1), unless the authorization is terminated or revoked.  Performed at [facility], [address], [place], [place] [address]     Labs: CBC: Recent Labs  Lab 10/12/22 2255  WBC 11.0*  NEUTROABS 6.8  HGB 11.1*  HCT 32.8*  MCV 94.3  PLT 331   Basic Metabolic Panel: Recent Labs  Lab 10/12/22 2255  NA 140  K 4.0  CL 107  CO2 20*  GLUCOSE 89  BUN 23  CREATININE 1.78*  CALCIUM 9.4   Liver Function Tests: No results for input(s): "AST", "ALT", "ALKPHOS", "BILITOT", "PROT", "ALBUMIN" in the last 168 hours. CBG: No results for input(s): "GLUCAP" in the last 168 hours.  Discharge time spent: greater than 30 minutes.  Signed: Thad Ranger, MD [facility] 10/13/2022

## 2022-10-13 NOTE — Progress Notes (Signed)
Pt arrived to the floor around 1345 via stretcher from ED in no acute distress. Pt ambulatory from stretcher to hospital bed with steady gait, no assistance. Respirations even and unlabored on room air. VSS. Pt refusing telemetry, MD made aware. Pt oriented to room, call bell within reach. Pt encouraged to call for assistance. Bed in low position.

## 2022-10-13 NOTE — ED Notes (Signed)
ED TO INPATIENT HANDOFF REPORT  ED Nurse Name and Phone #: [phone]  S Name/Age/Gender Kevin [name] 67 y.o. male Room/Bed: 030C/030C  Code Status   Code Status: Full Code  Home/SNF/Other Home Patient oriented to: self, place, time, and situation Is this baseline? Yes   Triage Complete: Triage complete  Chief Complaint COPD with acute exacerbation (HCC) [J44.1]  Triage Note Pt BIB [facility] for resp distress, hx COPD, CFH, emphysema, pt arrives on CPAP with EDP and RT at bedside, pt given Solumedrol, Mag, 0.5 Atrovent, 5mg  Albuterol en route, v/s upon arrival 220/130, hx of HTN, NSR, 76 HR, 99%, RR 24    Allergies No Known Allergies  Level of Care/Admitting Diagnosis ED Disposition     ED Disposition  Admit   Condition  --   Comment  Hospital Area: [facility] [[id number]]  Level of Care: Progressive [102]  Admit to Progressive based on following criteria: RESPIRATORY PROBLEMS hypoxemic/hypercapnic respiratory failure that is responsive to NIPPV (BiPAP) or High Flow Nasal Cannula (6-80 lpm). Frequent assessment/intervention, no > Q2 hrs < Q4 hrs, to maintain oxygenation and pulmonary hygiene.  May place patient in observation at [facility] or [facility] if equivalent level of care is available:: Yes  Covid Evaluation: Asymptomatic - no recent exposure (last 10 days) testing not required  Diagnosis: COPD with acute exacerbation [facility]) [[id number]]  Admitting Physician: John Giovanni [[id number]]  Attending Physician: John Giovanni [[id number]]          B Medical/Surgery History Past Medical History:  Diagnosis Date   AIDS (acquired immune deficiency syndrome) (HCC) 08/17/2016   Chronic diastolic CHF (congestive heart failure), NYHA class 3 (HCC) 01/2016   Chronic lower back pain    CKD (chronic kidney disease), stage III (HCC)    COPD (chronic obstructive pulmonary disease) (HCC)    Gout    "forearms, hands, ankles, feet" (06/05/2016)   Headache     "weekly" (06/05/2016)   Heart murmur    Hypertension    Hypertensive crisis 08/15/2017   Lipoma 07/06/2022   OSA on CPAP    PAD (peripheral artery disease) (HCC)    PAF (paroxysmal atrial fibrillation) (HCC) 01/2016   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   Past Surgical History:  Procedure Laterality Date   IR RADIOLOGIST EVAL & MGMT  05/31/2021   IR RADIOLOGIST EVAL & MGMT  07/26/2021   LEFT HEART CATH AND CORONARY ANGIOGRAPHY N/A 10/23/2016   Procedure: LEFT HEART CATH AND CORONARY ANGIOGRAPHY;  Surgeon: Marykay Lex, MD;  Location: [facility] INVASIVE CV LAB;  Service: Cardiovascular;  Laterality: N/A;   LOWER EXTREMITY ANGIOGRAPHY N/A 07/17/2016   Procedure: Lower Extremity Angiography;  Surgeon: Runell Gess, MD;  Location: [facility] INVASIVE CV LAB;  Service: Cardiovascular;  Laterality: N/A;   LOWER EXTREMITY INTERVENTION N/A 06/05/2016   Procedure: Lower Extremity Intervention;  Surgeon: Runell Gess, MD;  Location: [facility] INVASIVE CV LAB;  Service: Cardiovascular;  Laterality: N/A;   PERIPHERAL VASCULAR ATHERECTOMY Right 07/17/2016   Procedure: Peripheral Vascular Atherectomy;  Surgeon: Runell Gess, MD;  Location: [facility] INVASIVE CV LAB;  Service: Cardiovascular;  Laterality: Right;  SFA   PERIPHERAL VASCULAR INTERVENTION  06/05/2016   Procedure: Peripheral Vascular Intervention;  Surgeon: Runell Gess, MD;  Location: [facility] INVASIVE CV LAB;  Service: Cardiovascular;;  left SFA   RADIOLOGY WITH ANESTHESIA Right 06/29/2021   Procedure: RIGHT RENAL CRYOABLATION;  Surgeon: Richarda Overlie, MD;  Location: [facility] ORS;  Service: Anesthesiology;  Laterality: Right;  A IV Location/Drains/Wounds Patient Lines/Drains/Airways Status     Active Line/Drains/Airways     Name Placement date Placement time Site Days   Peripheral IV 10/12/22 18 G 1" Left Antecubital 10/12/22  2254  Antecubital  1            Intake/Output Last 24 hours No intake or output data in the 24 hours ending  10/13/22 0541  Labs/Imaging Results for orders placed or performed during the hospital encounter of 10/12/22 (from the past 48 hour(s))  CBC with Differential     Status: Abnormal   Collection Time: 10/12/22 10:55 PM  Result Value Ref Range   WBC 11.0 (H) 4.0 - 10.5 K/uL   RBC 3.48 (L) 4.22 - 5.81 MIL/uL   Hemoglobin 11.1 (L) 13.0 - 17.0 g/dL   HCT 09.8 (L) 11.9 - 14.7 %   MCV 94.3 80.0 - 100.0 fL   MCH 31.9 26.0 - 34.0 pg   MCHC 33.8 30.0 - 36.0 g/dL   RDW 82.9 56.2 - 13.0 %   Platelets 331 150 - 400 K/uL   nRBC 0.0 0.0 - 0.2 %   Neutrophils Relative % 60 %   Neutro Abs 6.8 1.7 - 7.7 K/uL   Lymphocytes Relative 23 %   Lymphs Abs 2.5 0.7 - 4.0 K/uL   Monocytes Relative 7 %   Monocytes Absolute 0.8 0.1 - 1.0 K/uL   Eosinophils Relative 8 %   Eosinophils Absolute 0.8 (H) 0.0 - 0.5 K/uL   Basophils Relative 1 %   Basophils Absolute 0.1 0.0 - 0.1 K/uL   Immature Granulocytes 1 %   Abs Immature Granulocytes 0.05 0.00 - 0.07 K/uL    Comment: Performed at [facility], [address] [place], [place] [address]  Basic metabolic panel     Status: Abnormal   Collection Time: 10/12/22 10:55 PM  Result Value Ref Range   Sodium 140 135 - 145 mmol/L   Potassium 4.0 3.5 - 5.1 mmol/L   Chloride 107 98 - 111 mmol/L   CO2 20 (L) 22 - 32 mmol/L   Glucose, Bld 89 70 - 99 mg/dL    Comment: Glucose reference range applies only to samples taken after fasting for at least 8 hours.   BUN 23 8 - 23 mg/dL   Creatinine, Ser 4.69 (H) 0.61 - 1.24 mg/dL   Calcium 9.4 8.9 - 62.9 mg/dL   GFR, Estimated 42 (L) >60 mL/min    Comment: (NOTE) Calculated using the CKD-EPI Creatinine Equation (2021)    Anion gap 13 5 - 15    Comment: Performed at [facility], [address] [place], [place] [address]  Resp panel by RT-PCR (RSV, Flu A&B, Covid) Anterior Nasal Swab     Status: None   Collection Time: 10/12/22 10:55 PM   Specimen: Anterior Nasal Swab  Result Value Ref Range   SARS  Coronavirus 2 by RT PCR NEGATIVE NEGATIVE   Influenza A by PCR NEGATIVE NEGATIVE   Influenza B by PCR NEGATIVE NEGATIVE    Comment: (NOTE) The Xpert Xpress SARS-CoV-2/FLU/RSV plus assay is intended as an aid in the diagnosis of influenza from Nasopharyngeal swab specimens and should not be used as a sole basis for treatment. Nasal washings and aspirates are unacceptable for Xpert Xpress SARS-CoV-2/FLU/RSV testing.  Fact Sheet for Patients: BloggerCourse.com  Fact Sheet for Healthcare Providers: SeriousBroker.it  This test is not yet approved or cleared by the [place] and has been authorized for  detection and/or diagnosis of SARS-CoV-2 by FDA under an Emergency Use Authorization (EUA). This EUA will remain in effect (meaning this test can be used) for the duration of the COVID-19 declaration under Section 564(b)(1) of the Act, 21 U.S.C. section 360bbb-3(b)(1), unless the authorization is terminated or revoked.     Resp Syncytial Virus by PCR NEGATIVE NEGATIVE    Comment: (NOTE) Fact Sheet for Patients: BloggerCourse.com  Fact Sheet for Healthcare Providers: SeriousBroker.it  This test is not yet approved or cleared by the [place] FDA and has been authorized for detection and/or diagnosis of SARS-CoV-2 by FDA under an Emergency Use Authorization (EUA). This EUA will remain in effect (meaning this test can be used) for the duration of the COVID-19 declaration under Section 564(b)(1) of the Act, 21 U.S.C. section 360bbb-3(b)(1), unless the authorization is terminated or revoked.  Performed at [facility], [address] [place], [place] [address]   Troponin I (High Sensitivity)     Status: Abnormal   Collection Time: 10/13/22 12:28 AM  Result Value Ref Range   Troponin I (High Sensitivity) 43 (H) <18 ng/L    Comment: (NOTE) Elevated high sensitivity  troponin I (hsTnI) values and significant  changes across serial measurements may suggest ACS but many other  chronic and acute conditions are known to elevate hsTnI results.  Refer to the "Links" section for chest pain algorithms and additional  guidance. Performed at [facility], [address], [place], [place] [address]   Troponin I (High Sensitivity)     Status: Abnormal   Collection Time: 10/13/22  2:33 AM  Result Value Ref Range   Troponin I (High Sensitivity) 42 (H) <18 ng/L    Comment: (NOTE) Elevated high sensitivity troponin I (hsTnI) values and significant  changes across serial measurements may suggest ACS but many other  chronic and acute conditions are known to elevate hsTnI results.  Refer to the "Links" section for chest pain algorithms and additional  guidance. Performed at [facility], [address], [place], [place] [address]    DG Chest Port 1 View  Result Date: 10/12/2022 CLINICAL DATA:  Shortness of breath.  History of COPD and CHF. EXAM: PORTABLE CHEST 1 VIEW COMPARISON:  Chest radiograph dated 09/11/2022 and CT dated 09/06/2022. FINDINGS: No focal consolidation, pleural effusion, or pneumothorax. The cardiac silhouette is within normal limits. No acute osseous pathology. IMPRESSION: No active disease. Electronically Signed   By: Elgie Collard M.D.   On: 10/12/2022 23:26    Pending Labs Unresulted Labs (From admission, onward)    None       Vitals/Pain Today's Vitals   10/13/22 0245 10/13/22 0338 10/13/22 0338 10/13/22 0438  BP: (!) 146/80 (!) 147/81    Pulse: 69 82    Resp:  20    Temp:  98 F (36.7 C)    TempSrc:  Oral    SpO2: 100% 99%  100%  Weight:      Height:      PainSc:  0-No pain 0-No pain     Isolation Precautions No active isolations  Medications Medications  dolutegravir-lamiVUDine (DOVATO) 50-300 MG per tablet 1 tablet (has no administration in time range)  atorvastatin (LIPITOR) tablet 40 mg (has no  administration in time range)  hydrALAZINE (APRESOLINE) tablet 100 mg (has no administration in time range)  metoprolol tartrate (LOPRESSOR) tablet 25 mg (has no administration in time range)  acetylcysteine (MUCOMYST) 20 % nebulizer / oral solution 4 mL (  has no administration in time range)  albuterol (PROVENTIL) (2.5 MG/3ML) 0.083% nebulizer solution 2.5 mg (has no administration in time range)  methylPREDNISolone sodium succinate (SOLU-MEDROL) 125 mg/2 mL injection 80 mg (has no administration in time range)  enoxaparin (LOVENOX) injection 40 mg (has no administration in time range)  acetaminophen (TYLENOL) tablet 650 mg (has no administration in time range)    Or  acetaminophen (TYLENOL) suppository 650 mg (has no administration in time range)  amLODipine (NORVASC) tablet 10 mg (has no administration in time range)    And  irbesartan (AVAPRO) tablet 300 mg (has no administration in time range)  ipratropium-albuterol (DUONEB) 0.5-2.5 (3) MG/3ML nebulizer solution 3 mL (has no administration in time range)  hydrALAZINE (APRESOLINE) tablet 100 mg (100 mg Oral Given 10/13/22 0009)  metoprolol tartrate (LOPRESSOR) tablet 25 mg (25 mg Oral Given 10/13/22 0009)  hydrOXYzine (ATARAX) tablet 25 mg (25 mg Oral Given 10/13/22 0009)  albuterol (PROVENTIL) (2.5 MG/3ML) 0.083% nebulizer solution 5 mg (5 mg Nebulization Given 10/13/22 0056)  ipratropium (ATROVENT) nebulizer solution 0.5 mg (0.5 mg Nebulization Given 10/13/22 0056)  albuterol (PROVENTIL) (2.5 MG/3ML) 0.083% nebulizer solution (10 mg/hr Nebulization Given 10/13/22 0438)    Mobility walks     Focused Assessments Pulmonary Assessment Handoff:  Lung sounds: Bilateral Breath Sounds: Expiratory wheezes, Diminished L Breath Sounds: Expiratory wheezes, Inspiratory wheezes R Breath Sounds: Inspiratory wheezes, Expiratory wheezes O2 Device: Nasal Cannula O2 Flow Rate (L/min): 3 L/min    R Recommendations: See Admitting Provider  Note  Report given to:   Additional Notes:  patient is alert oriented , been off and on Bipap, takes him self off, so currently just trying nebs. And patient is stable able to speak in complete sentences.

## 2022-10-13 NOTE — ED Notes (Signed)
Pt eating

## 2022-10-13 NOTE — Progress Notes (Signed)
Patient weaned to 2L [place]. PA at bedside.  BiPAP on standby.  RT will continue to monitor.

## 2022-10-13 NOTE — Progress Notes (Signed)
[facility]                                                                              Kevin [name], is a 67 y.o. male, DOB - [birth year], ZOX:[id number] Admit date - 10/12/2022    Outpatient Primary MD for the patient is [name], MD  LOS - 0  days  Chief Complaint  Patient presents with   Respiratory Distress       Brief summary   Patient is a 67 year old male with uncontrolled hypertension, noncompliance, cocaine use, CAD, chronic HFpEF, paroxysmal A-fib, HLP, COPD, nicotine use, OSA, noncompliant with CPAP, HIV, PAD status post left SFA intervention in 2018, CKD 3B, renal cell ca presented to ED with respiratory distress.  Patient arrived via EMS on CPAP.  He was given IV Solu-Medrol, IV mag, DuoNebs.  He reported cough, shortness of breath and wheezing that started a day before the admission.  He was using home inhalers without much improvement.  COVID/influenza, RSV negative.  WBCs 11.0, BP of 220/130 on arrival, afebrile Chest x-ray negative for any pneumonia.  Patient was placed on BiPAP in ED, respiratory status initially improved and patient took off BiPAP, had increased work of breathing. Patient was admitted for further workup    Assessment & Plan      Acute respiratory failure with hypoxia Acute COPD exacerbation -Presented with acute shortness of breath, wheezing, cough, hypoxia, placed on BiPAP in ED -At the time of my encounter, off the BiPAP, on 3 L O2 via [place].  States not on home O2 at baseline. -Continue scheduled DuoNebs, IV Solu-Medrol -Add Pulmicort, Brovana, flutter valve, doxycycline -Continue Mucomyst nebs    Hypertensive urgency -BP in 200s on admission, stated he missed taking his medications a day before the admission -Resumed home medications including hydralazine, metoprolol, amlodipine and losartan -Follow UDS -Increase hydralazine to 100 mg 3 times daily   CAD - Troponin slightly elevated but stable and not  consistent with ACS.   -Currently no chest pain.     Chronic HFpEF - Echo done in November 2023 showing EF 60 to 65%, grade 1 diastolic dysfunction.  -Euvolemic, no evidence of pulmonary edema on imaging.     Paroxysmal A-fib Not on chronic anticoagulation.   -Currently in sinus rhythm.  Continue metoprolol.   Hyperlipidemia Continue Lipitor.   HIV Continue Dovato.   CKD stage IIIb Creatinine 1.7, stable. -Follow-up BMET  Estimated body mass index is 21.43 kg/m as calculated from the following:   Height as of this encounter: 6' (1.829 m).   Weight as of this encounter: 71.7 kg.  Code Status: Full code DVT Prophylaxis:  enoxaparin (LOVENOX) injection 40 mg Start: 10/13/22 1400   Level of Care: Level of care: Progressive Family Communication: Disposition Plan:      Remains inpatient appropriate:      Procedures:    Consultants:     Antimicrobials:   Anti-infectives (From admission, onward)    Start     Dose/Rate Route Frequency Ordered Stop   10/13/22 1000  dolutegravir-lamiVUDine (DOVATO) 50-300 MG per tablet 1 tablet        1  tablet Oral Daily 10/13/22 0524            Medications  acetylcysteine  4 mL Nebulization Q4H   amLODipine  10 mg Oral Daily   And   irbesartan  300 mg Oral Daily   atorvastatin  40 mg Oral Daily   dolutegravir-lamiVUDine  1 tablet Oral Daily   enoxaparin (LOVENOX) injection  40 mg Subcutaneous Q24H   hydrALAZINE  100 mg Oral BID   ipratropium-albuterol  3 mL Nebulization Q6H   methylPREDNISolone (SOLU-MEDROL) injection  80 mg Intravenous Q24H   metoprolol tartrate  25 mg Oral BID      Subjective:   Kevin [name] was seen and examined today.  Not on BiPAP on my encounter.  On 3 L O2 via [place], states not on O2 at baseline.  No acute chest pain or shortness of breath.  No fevers.  Feels improving.  No abdominal pain, nausea vomiting or any focal weakness. Objective:   Vitals:   10/13/22 0659 10/13/22 0739 10/13/22 0806  10/13/22 1145  BP:  (!) 160/91  (!) 164/94  Pulse:  80 90 90  Resp:  18  19  Temp: 97.8 F (36.6 C) 98 F (36.7 C)  98 F (36.7 C)  TempSrc: Oral Oral  Oral  SpO2:  100% 98% 98%  Weight:      Height:       No intake or output data in the 24 hours ending 10/13/22 1252   Wt Readings from Last 3 Encounters:  10/12/22 71.7 kg  10/05/22 71.7 kg  09/29/22 72.8 kg     Exam General: Alert and oriented x 3, NAD Cardiovascular: S1 S2 auscultated,  RRR Respiratory: Diminished breath sounds throughout with mild wheezing Gastrointestinal: Soft, nontender, nondistended, + bowel sounds Ext: no pedal edema bilaterally Neuro: Strength 5/5 upper and lower extremities bilaterally Skin: No rashes Psych: Normal affect     Data Reviewed:  I have personally reviewed following labs    CBC Lab Results  Component Value Date   WBC 11.0 (H) 10/12/2022   RBC 3.48 (L) 10/12/2022   HGB 11.1 (L) 10/12/2022   HCT 32.8 (L) 10/12/2022   MCV 94.3 10/12/2022   MCH 31.9 10/12/2022   PLT 331 10/12/2022   MCHC 33.8 10/12/2022   RDW 15.1 10/12/2022   LYMPHSABS 2.5 10/12/2022   MONOABS 0.8 10/12/2022   EOSABS 0.8 (H) 10/12/2022   BASOSABS 0.1 10/12/2022     Last metabolic panel Lab Results  Component Value Date   NA 140 10/12/2022   K 4.0 10/12/2022   CL 107 10/12/2022   CO2 20 (L) 10/12/2022   BUN 23 10/12/2022   CREATININE 1.78 (H) 10/12/2022   GLUCOSE 89 10/12/2022   GFRNONAA 42 (L) 10/12/2022   GFRAA 35 (L) 04/05/2019   CALCIUM 9.4 10/12/2022   PROT 6.9 09/11/2022   ALBUMIN 3.1 (L) 09/11/2022   BILITOT 0.5 09/11/2022   ALKPHOS 69 09/11/2022   AST 13 (L) 09/11/2022   ALT 10 09/11/2022   ANIONGAP 13 10/12/2022    CBG (last 3)  No results for input(s): "GLUCAP" in the last 72 hours.    Coagulation Profile: No results for input(s): "INR", "PROTIME" in the last 168 hours.   Radiology Studies: I have personally reviewed the imaging studies  DG Chest Port 1 View  Result  Date: 10/12/2022 CLINICAL DATA:  Shortness of breath.  History of COPD and CHF. EXAM: PORTABLE CHEST 1 VIEW COMPARISON:  Chest radiograph dated 09/11/2022 and  CT dated 09/06/2022. FINDINGS: No focal consolidation, pleural effusion, or pneumothorax. The cardiac silhouette is within normal limits. No acute osseous pathology. IMPRESSION: No active disease. Electronically Signed   By: Elgie Collard M.D.   On: 10/12/2022 23:26       Luana Tatro M.D. [facility] 10/13/2022, 12:52 PM  Available via Epic secure chat 7am-7pm After 7 pm, please refer to night coverage provider listed on amion.

## 2022-10-13 NOTE — Progress Notes (Signed)
SATURATION QUALIFICATIONS: (This note is used to comply with regulatory documentation for home oxygen)  Patient Saturations on Room Air at Rest = 96%  Patient Saturations on Room Air while Ambulating = 96%  Patient Saturations on NA Liters of oxygen while Ambulating = NA%  Please briefly explain why patient needs home oxygen:

## 2022-10-13 NOTE — Progress Notes (Signed)
Pt asking if he could be discharged and wanting to leave now. Pt encouraged to stay until medically cleared by MD. MD made aware.

## 2022-10-16 ENCOUNTER — Telehealth: Payer: Self-pay

## 2022-10-16 NOTE — Transitions of Care (Post Inpatient/ED Visit) (Signed)
10/16/2022  Name: Kevin [name] MRN: [id number] DOB: [birth year]  Today's TOC FU Call Status: Today's TOC FU Call Status:: Successful TOC FU Call Completed TOC FU Call Complete Date: 10/16/22 Patient's Name and Date of Birth confirmed.  Transition Care Management Follow-up Telephone Call Date of Discharge: 10/13/22 Discharge Facility: [facility]) Type of Discharge: Inpatient Admission Primary Inpatient Discharge Diagnosis:: COPD How have you been since you were released from the hospital?: Better Any questions or concerns?: No  Items Reviewed: Did you receive and understand the discharge instructions provided?: Yes Medications obtained,verified, and reconciled?: Yes (Medications Reviewed) Any new allergies since your discharge?: No Dietary orders reviewed?: Yes Do you have support at home?: Yes People in Home: significant other  Medications Reviewed Today: Medications Reviewed Today     Reviewed by Karena Addison, LPN (Licensed Practical Nurse) on 10/16/22 at 1217  Med List Status: <None>   Medication Order Taking? Sig Documenting Provider Last Dose Status Informant  acetylcysteine (MUCOMYST) 10 % nebulizer solution [id number] No Take 4 mLs by nebulization every 4 (four) hours. [name], MD 10/11/2022 Active Self  albuterol (VENTOLIN HFA) 108 (90 Base) MCG/ACT inhaler [id number] No Inhale 2 puffs into the lungs every 4 (four) hours as needed for wheezing or shortness of breath. Tilden Fossa, MD 10/12/2022 Active Self  amLODipine-valsartan (EXFORGE) 10-320 MG tablet [id number]  Take 1 tablet by mouth daily. Rai, Delene Ruffini, MD  Active   atorvastatin (LIPITOR) 40 MG tablet [id number]  Take 1 tablet (40 mg total) by mouth daily. Rai, Delene Ruffini, MD  Active   dolutegravir-lamiVUDine (DOVATO) 50-300 MG tablet [id number] No Take 1 tablet by mouth daily. [name], [name], MD 10/11/2022 Active Self  doxycycline (VIBRA-TABS) 100 MG tablet [id number]  Take 1 tablet (100 mg  total) by mouth 2 (two) times daily for 7 days. Cathren Harsh, MD  Active   Fluticasone-Umeclidin-Vilant (TRELEGY ELLIPTA) 200-62.5-25 MCG/ACT AEPB [id number] No Inhale 1 puff into the lungs daily. [name], NP 10/12/2022 am Active Self  hydrALAZINE (APRESOLINE) 100 MG tablet [id number]  Take 1 tablet (100 mg total) by mouth 3 (three) times daily. Rai, Delene Ruffini, MD  Active   hydrOXYzine (ATARAX) 25 MG tablet [id number] No Take 25 mg by mouth as needed for anxiety. [provider] 10/11/2022 Active Self  ipratropium-albuterol (DUONEB) 0.5-2.5 (3) MG/3ML SOLN [id number] No Take 3 mLs by nebulization every 6 (six) hours as needed (wheezing or shortness of breath). Gwyneth Sprout, MD 10/11/2022 Active Self           Med Note (WHITE, Elvin So   Fri Oct 13, 2022  4:30 AM) Patient prefers not to use it since it nasty and not effective.  metoprolol tartrate (LOPRESSOR) 25 MG tablet [id number]  Take 1 tablet (25 mg total) by mouth 2 (two) times daily. Rai, Delene Ruffini, MD  Active   oxyCODONE-acetaminophen (PERCOCET) 7.5-325 MG tablet [id number] No Take 1 tablet by mouth 3 (three) times daily as needed for moderate pain. [provider] Past Week Active Self  predniSONE (DELTASONE) 10 MG tablet [id number]  Prednisone dosing: Take  Prednisone 40mg  (4 tabs) x 3 days, then taper to 30mg  (3 tabs) x 3 days, then 20mg  (2 tabs) x 3days, then 10mg  (1 tab) x 3days, then OFF. Cathren Harsh, MD  Active             Home Care and Equipment/Supplies: Were Home Health Services Ordered?: NA Any new equipment or medical supplies ordered?: NA  Functional Questionnaire: Do you need assistance with bathing/showering or dressing?: No Do you need assistance with meal preparation?: No Do you need assistance with eating?: No Do you have difficulty maintaining continence: No Do you need assistance with getting out of bed/getting out of a chair/moving?: No Do you have difficulty managing or taking  your medications?: No  Follow up appointments reviewed: PCP Follow-up appointment confirmed?: Yes Date of PCP follow-up appointment?: 10/23/22 Follow-up Provider: [facility] Follow-up appointment confirmed?: Yes Date of Specialist follow-up appointment?: 10/30/22 Follow-Up Specialty Provider:: pulmo Do you need transportation to your follow-up appointment?: No Do you understand care options if your condition(s) worsen?: Yes-patient verbalized understanding    SIGNATURE Karena Addison, LPN [facility] Nurse Health Advisor Direct Dial [phone]

## 2022-10-17 NOTE — Progress Notes (Signed)
Cardiology Office Note:    Date:  10/24/2022   ID:  Kevin [name], DOB 1955/08/07, MRN [id number]  PCP:  [name], MD  Cardiologist:  Chrystie Nose, MD  Electrophysiologist:  None   Referring MD: [name], MD   Chief Complaint: follow-up of dyspnea and hypertension  History of Present Illness:    Kevin [name] is a 67 y.o. male with a history of normal coronaries on cardiac catheterization in 10/2016, chronic diastolic CHF, paroxysmal atrial fibrillation not on anticoagulation, PAD s/p atherectomy and angioplasty of mid right SFA in 07/2016, hypertension, CKD stage III, COPD with frequent exacerbations, obstructive sleep apnea non-compliant with BiPAP, chronic back pain, renal cell carcinoma, and HIV who is followed by Dr. Rennis Golden and presents today for follow-up of dyspnea and hypertension.    Patient was first seen by Cardiology during an admission in 01/2016 for hypertensive emergency and acute diastolic CHF. Echo during admission showed LVEF of 65-70% with grade 1 diastolic dysfunction. He was also noted to be in atrial fibrillation in arrival but converted to normal sinus rhythm. He was not started on anticoagulation given low CHA2DS2-VASc score and poor medical follow-up. He has not had any documented recurrent atrial fibrillation since 02/2016. Myoview was performed in 03/2016 for further evaluation of dyspnea and showed a fixed defect in the basal inferior, mid inferior and apical inferior location consistent with an inferior wall MI. He underwent peripheral angiography with Dr. Allyson Sabal in 07/2016 and underwent a directional atherectomy followed by drug eluting balloon angioplasty of a high-grade mid right SFA stenosis. Repeat lower extremity dopplers later that month showed improvement in blood flow to right leg. ABI was 0.82 on the right and 0.95 on the left. Left cardiac catheterization was performed in 10/2016 due to progressive dyspnea one exertion, chest tightness, and  fatigue and showed normal coronaries as well as normal LV function and LVEDP. He was admitted in 03/2019 for sudden onset of shortness of breath. High-sensitivity troponin was minimally elevated and flat in the 60s. Echo showed LVEF of 55-60% with normal wall motion and grade 1 diastolic dysfunction, normal RV, and no significant valvular disease. Myoview showed a large fixed inferior wall defect extending to the apex similar to what was seen on Myoview in 2018 prior to cardiac catheterization.    He was admitted in 12/2021 for hypertensive urgency and possible COPD exacerbation after presenting with shortness of breath and chest pain with coughing after running out of his medicaitons. UDS was positive for cocaine. High-sensitivity troponin was minimally elevated and flat in the 70s consistent with demand ischemia. Echo showed LVEF of 60-65% with normal wall motion, moderate LVH, and grade 1 diastolic dysfunction. He was initially treated with a Nicardipine drip and home medications were resumed with improvement in BP.  He has continued to have multiple ED visits over the last several months. He had 6 ED visits from 04/2022 to early 08/2022 alone: one for back/ chest pain (felt to be musculoskeletal in nature), one severe hypertension and abnormal EKG (although no changes from baseline), and 4 visits for COPD exacerbations. During these visits, BP often markedly elevated with systolic BP often in the 190s to 200s.   He was seen by me on 08/15/2022 for pre-op evaluation for upcoming colonoscopy. He had been seen in the ED the day prior for another COPD exacerbation where he was treated with breathing treatments and steroids. He reported feeling better on the Prednisone. He reported some chest tightness  during COPD exacerbations but nothing outside of these times. He also described 2 episodes over the last week where his left eye went "completely black" for 15 minutes and then returned concerning for amaurosis fugax.  I felt like he was stable from a cardiac standpoint but strongly recommended optimization of COPD and further evaluation for possible TIA prior to proceeding with his colonoscopy.  I ordered a 30 day Event Monitor and carotid dopplers for further evaluation of of possible TIA/ amaurosis fugax. Repeat lower extremity arterial ultrasounds and ABIs were also ordered given history of PAD and reports of claudication. Carotid dopplers showed only mild 1-39% stenosis of bilateral ICAs. Monitor showed 4 brief episodes of NSVT and a 1% PVC burden but no atrial fibrillation. Lower extremity dopplers showed 50-74% stenosis of bilateral SFAs, 30-49% stenosis of right popliteal artery, 50-74% stenosis of right tibioperoneal trunk, and total occlusion of bilateral anterior and posterior arteries. ABIs were mildly reduced bilaterally (0.62 on the right and 0.45 on the left). He was referred to Dr. Allyson Sabal. He was also referred to Neurology for further evaluation of possible TIA - they have ordered a brain MRI and head/ neck CTA which are still pending.    Since this visit, he has had 3 additional ED visits all for COPD exacerbations. During one of these ED visits on 09/11/2022, EKG showed T wave inversions in inferior leads and leads V3-V6. He was seen back in the office after this and Myoview was ordered which was low risk with no evidence of ischemia. He was also seen in the Hypertension Clinic on 10/05/2022 and medications were adjusted. Of note, BP was noted to be markedly different between his two arms (higher on the left). During the most recent ED visit on 10/12/2022, he actually required BiPAP and was admitted overnight. He was treated with steroids, antibiotics, and breathings treatments and discharged on a Prednisone taper. BP was again noted to be markedly elevated with systolic BP in the 200s on admission. This was in the setting of missing his home BP medications. Home medications were resumed and Hydralazine was  increased.   Patient presents today for follow-up. He is doing much better than when I saw him a couple of months ago. His breathing has improved. He still has some shortness of breath and a lot of mucus first thing in the morning for about 15-25 minutes but then he states it goes away. No shortness of breath at rest. No orthopnea, PND, or edema. No chest pain, palpitations, lightheadedness, dizziness, or syncope. He continues to have leg pain with ambulating that sounds like claudication.  He is still having some transient vision changes. He states he his eye was really cloudy yesterday for about 7-8 minutes and then it went away. He is going for a brain MRI and head/ neck CTA tomorrow. He states he was referred to Ophthalmology about 9 months ago but has not been called for an appointment. He also states he has been referred to a Nephrologist and is waiting to hear from them as well.   BP was 150/90 on the left arm and 160/106 on the right arm at the beginning of the visit and then 166/98 on the left arm and 178/98 on the right arm at the end of the visit. However, he states this is much better than what it used to be (systolic BP used to be in the 161W to 200s). He states his systolic BP has actually been in the 130s at some other  doctor visits. He states he is compliant with his medications.   He unfortunately continues to smoke but is not smoking as much. He estimates he is smoking about 1 and 3 cigarettes per day and some days he goes without smoking.  EKGs/Labs/Other Studies Reviewed:    The following studies were reviewed:   Left Cardiac Catheterization 10/23/2016: Angiographically normal coronary arteries. Left dominant system Normal LV function with mildly elevated LVEDP The left ventricular ejection fraction is 55-65% by visual estimate.   With normal LV function, LVEDP and normal coronaries, would recommend evaluating for noncardiac etiology for dyspnea. _______________   Myoview  04/04/2019: Impression: 1. Large fixed inferior wall defect extending to the apex. Apical component appears larger on stress imaging suspicious for small amount of apical peri-infarct inducible ischemia. 2. Septal hypokinesis. 3. Left ventricular ejection fraction 50% 4. Non invasive risk stratification*: Intermediate _______________   Echocardiogram 12/27/2021: Impressions: 1. Left ventricular ejection fraction, by estimation, is 60 to 65%. The  left ventricle has normal function. The left ventricle has no regional  wall motion abnormalities. There is moderate concentric left ventricular  hypertrophy. Left ventricular  diastolic parameters are consistent with Grade I diastolic dysfunction  (impaired relaxation).   2. Right ventricular systolic function is normal. The right ventricular  size is normal. There is mildly elevated pulmonary artery systolic  pressure.   3. Left atrial size was mildly dilated.   4. The mitral valve is normal in structure. No evidence of mitral valve  regurgitation. No evidence of mitral stenosis.   5. The aortic valve is grossly normal. Aortic valve regurgitation is not  visualized. No aortic stenosis is present.   6. The inferior vena cava is normal in size with greater than 50%  respiratory variability, suggesting right atrial pressure of 3 mmHg.   Comparison(s): No significant change from prior study. _______________  Lower Extremity Arterial Ultrasound/ ABIs 09/05/2022: Summary: Right: 50-74% stenosis noted in the superficial femoral artery. 30-49%  stenosis noted in the popliteal artery. 50-74% stenosis of the  tibioperoneal trunk artery. Total occlusion noted in the anterior tibial  artery. Total occlusion noted in the posterior  tibial artery.   Left: 50-74% stenosis noted in the mid and distal superficial femoral  artery (high-end of range in the distal SFA based on PSV). Total occlusion  noted in the anterior tibial and posterior tibial  arteries.   ABI Summary: Right: Resting right ankle-brachial index indicates mild right lower  extremity arterial disease. The right toe-brachial index is abnormal.   Left: Resting left ankle-brachial index indicates mild left lower  extremity arterial disease. The left toe-brachial index is abnormal.  _______________  Carotid Ultrasound 09/05/2022: Summary:  - Right Carotid: Velocities in the right ICA are consistent with a 1-39%  stenosis.  - Left Carotid: Velocities in the left ICA are consistent with a 1-39%  stenosis.  - Vertebrals: Bilateral vertebral arteries demonstrate antegrade flow,  atypical antegrade flow on the left.  - Subclavians: Normal flow hemodynamics were seen in bilateral subclavian arteries.  _______________  Monitor 08/25/2022 to 09/23/2022: Sinus rhythm with 4 brief episodes of NSVT - PVC's with burden of 1% noted. No afib.  _______________  Myoview 09/26/2022:   Normal perfusion. LVEF mildly reduced 43%. Recent echo noted to be normal. Suspect the value on this study is artifact.   LV perfusion is normal. There is no evidence of ischemia. There is no evidence of infarction.   Left ventricular function is abnormal. Nuclear stress EF: 43%. The  left ventricular ejection fraction is moderately decreased (30-44%). End diastolic cavity size is mildly enlarged.   The study is normal. The study is low risk.   EKG:  EKG not ordered today.   Recent Labs: 04/28/2022: Magnesium 2.0; TSH 1.28 07/25/2022: B Natriuretic Peptide 18.8 09/11/2022: ALT 10 10/12/2022: BUN 23; Creatinine, Ser 1.78; Hemoglobin 11.1; Platelets 331; Potassium 4.0; Sodium 140  Recent Lipid Panel    Component Value Date/Time   CHOL 184 07/06/2022 1145   TRIG 116 07/06/2022 1145   HDL 52 07/06/2022 1145   CHOLHDL 3.5 07/06/2022 1145   VLDL 41 (H) 04/04/2019 0458   LDLCALC 110 (H) 07/06/2022 1145    Physical Exam:    Vital Signs: BP (!) 178/98 (BP Location: Right Arm, Patient Position:  Sitting)   Pulse (!) 59   Ht 6' (1.829 m)   Wt 162 lb (73.5 kg)   SpO2 97%   BMI 21.97 kg/m     Wt Readings from Last 3 Encounters:  10/24/22 162 lb (73.5 kg)  10/13/22 158 lb 1.1 oz (71.7 kg)  10/05/22 158 lb (71.7 kg)     General: 67 y.o. thin African-American male in no acute distress. HEENT: Normocephalic and atraumatic. Sclera clear. EOMs intact. Neck: Supple. No carotid bruits. No JVD. Heart: RRR. No murmurs, gallops, or rubs. Radial and distal pedal pulses 2+ and equal bilaterally. Lungs: No increased work of breathing. Clear to ausculation bilaterally. No wheezes, rhonchi, or rales.  MSK: Normal strength and tone for age.  Extremities: No lower extremity edema.    Skin: Warm and dry. Neuro: Alert and oriented x3. No focal deficits. Psych: Normal affect. Responds appropriately.   Assessment:    1. Dyspnea on exertion   2. Chronic diastolic CHF (congestive heart failure) (HCC)   3. Paroxysmal atrial fibrillation (HCC)   4. PAD (peripheral artery disease) (HCC)   5. Primary hypertension   6. Hyperlipidemia, unspecified hyperlipidemia type   7. Stage 3b chronic kidney disease (HCC)   8. Chronic obstructive pulmonary disease, unspecified COPD type (HCC)   9. Tobacco abuse   10. Transient vision disturbance   11. Pre-op evaluation     Plan:    Dyspnea on Exertion Patient has chronic dyspnea on exertion. Echo in 12/2021 showed normal LV function. Myoview in 09/2022 was low risk with no evidence of ischemia. Patient has severe COPD and has had multiple ED visits/ hospitalizations over the last 6 months for COPD exacerbations.  - Doing better after most recent admission at the end of last month. Breathing is better. - I do not think dyspnea is cardiac in nature and suspect dyspnea is due to COPD. Will defer treatment of this to Pulmonology.  Chronic Diastolic CHF Echo in 12/2021 showed LVEF of 60-65% with normal wall motion, moderate, moderate LVH, and grade 1 diastolic  dysfunction. Myoview in 09/2022 showed mildly reduced EF of 43% but given recent Echo showed normal LV function this was suspect to be due to artifact.  - Euvolemic on exam. - No need for diuretics at this time. - Continue treatment for hypertension as below.   Paroxysmal Atrial Fibrillation Initially diagnosed during an admission in 01/2016 for hypertensive emergency and CHF. He converted back to normal sinus rhythm at that time and was not started on anticoagulation due to low CHA2DS2-VASc score and poor medical follow-up. He has not had any recurrent atrial fibrillation since 02/2016 so has not been on anticoagulation. Recent monitor in  08/2022 showed 4 brief episodes of  NSVT and a 1% PVC burden but no atrial fibrillation. - Maintaining sinus rhythm on exam. - Continue Coreg 6.25mg  twice daily.  - Not on anticoagulation as above. If he has any documented recurrence, will need to consider anticoagulation.   PAD S/p atherectomy and angioplasty of mid right SFA in 07/2016. Recent lower extremity arterial dopplers in 08/2022 showed 50-74% stenosis of bilateral SFAs, 30-49% stenosis of right popliteal artery, 50-74% stenosis of right tibioperoneal trunk, and total occlusion of bilateral anterior and posterior arteries. ABIs were mildly reduced bilaterally (0.62 on the right and 0.45 on the left). He was referred back to Dr. Allyson Sabal. - He continues to have claudication.  - Continue Aspirin 81mg  daily and Lipitor 40mg  daily.  - Discussed importance of complete cessation of tobacco use. - Will arrange follow-up with Dr. Allyson Sabal.   Hypertension Patient has a long history of difficult resistant hypertension. He was recently seen by the [facility] clinic last month. Poor medication compliance was felt to be contributing. He does have known sleep apnea and is non-compliant with CPAP. However, otherwise work-up for secondary causes of hypertension have been unremarkable. Renal dopplers in 2018 showed no evidence  of renal artery stenosis.  - BP elevated in the office. Initially 150/90 on the left arm and 160/106 on the right arm and then was 166/98 on the left arm and 178/98 on the right arm at the end of the visit.  However, this is better than before. He also reports he has had some systolic BP reading in the 130s at other doctor's visits. - Current medications: Amlodipine-Valsartan 10-320mg  daily, Lopressor 25mg  twice daily (previously on Coreg but switched to a cardioselective beta-blocker given severe COPD), and Hydralazine 100mg  three times daily.  - Will stop Lopressor and switch to Nebivolol 5mg  daily as recommended by the HTN Clinic team. He is currently getting is getting pill packs from our [facility] so have asked them to help with medication change. - He already has follow-up with the HTN Clinic next month. Emphasized the importance of keeping this appointment and bringing home BP log to this visit.  Of note, he does have some variation in the readings between arms. However, recent carotid dopplers in 08/2022 showed normal flow in bilateral subclavian arteries. He is scheduled for head/ neck CTA tomorrow so can evaluate further then.   Hyperlipidemia Lipid panel in 06/2022: Total Cholesterol 184, Triglycerides 116, HDL 52, LDL 110. LDL goal <70 given PAD. - Continue Lipitor 40mg  daily. This was restarted at visit in 06/2022.  - Will repeat lipid panel and LFTs today.    CKD Stage III Baseline creatinine around 2.0. Stable at 1.78 on most recent labs on 10/12/2022. Suspect this is largely due to longstanding uncontrolled hypertension.  - PCP has referred him to Nephrology but he has not heard anything yet. Recommended asking PCP for the Nephrologist's number and then personally calling their office.   COPD Tobacco Abuse Patient has had multiple ED visits over the last 6 months for COPD exacerbations as described above. - Stable.  - He does continue to smoke and I discussed the  importance of complete cessation again.  - Management per Pulmonology.  Obstructive Sleep Apnea Previously has been non-compliant with BiPAP. - Looks like Pulmonology is going to manage this.   Transient Vision Loss Patient reported 2 episodes of transient vision loss at last visit in 06/2022. Carotid dopplers showed only mild 1-39% stenosis of bilateral ICAs. Monitor showed no evidence of atrial fibrillation.  He was referred to Neurology who ordered brain MRI and head/ neck CTA which is scheduled for tomorrow. - He had another episode of transient blurry vision yesterday that lasted for about 7-8 minutes.  - He has never checked his BP during one of these episodes. Asked him to do this next time. - Emphasized the importance of keeping appointment for brain MRI and head/neck CTA tomorrow.  - Management per Neurology. - He has also been referred to Opthalmology but has not hear back from them either. Recommended asking PCP for the Opthalmologist's number and then personally calling their office.  Pre-Op Evaluation Patient has colonoscopy scheduled for next week. He is stable from a cardiac standpoint for this low risk procedure. However, would recommended reaching out to Pulmonology for their recommendations given severe COPD with frequent exacerbations.   Disposition: Keep follow-up visit with Gillian Shields, NP with the HTN Clinic, in 11/2022. Follow-up with Dr. Allyson Sabal for PAD. Follow-up with Dr. Rennis Golden in 3 months.    Medication Adjustments/Labs and Tests Ordered: Current medicines are reviewed at length with the patient today.  Concerns regarding medicines are outlined above.  No orders of the defined types were placed in this encounter.  Meds ordered this encounter  Medications   nebivolol (BYSTOLIC) 5 MG tablet    Sig: Take 1 tablet (5 mg total) by mouth daily.    Dispense:  30 tablet    Refill:  6    PLEASE UPDATE PT's PILL PACK    Patient Instructions  Medication Instructions:   The current medical regimen is effective;  continue present plan and medications as directed. Please refer to the Current Medication list given to you today.   PLEASE HAVE PHARMACY UPDATE YOUR PILL PACK  *If you need a refill on your cardiac medications before your next appointment, please call your pharmacy*  Lab Work: LIPID AND LFT TODAY If you have labs (blood work) drawn today and your tests are completely normal, you will receive your results only by:  MyChart Message (if you have MyChart) OR  A paper copy in the mail If you have any lab test that is abnormal or we need to change your treatment, we will call you to review the results.  Testing/Procedures: NONE  Follow-Up: At [facility], you and your health needs are our priority.  As part of our continuing mission to provide you with exceptional heart care, we have created designated Provider Care Teams.  These Care Teams include your primary Cardiologist (physician) and Advanced Practice Providers (APPs -  Physician Assistants and Nurse Practitioners) who all work together to provide you with the care you need, when you need it.  Your next appointment:   3 month(s)  Provider:   Chrystie Nose, MD  or Marjie Skiff, PA-C AND KEEP SCHEDULED APPT WITH Hubbard Hartshorn, NP       PLEASE SCHEDULED AN APPOINTMENT WITH DR Allyson Sabal FOR PAD APPOINTMENT   Other Instructions TAKE AND LOG YOUR BLOOD PRESSURE AND BRING YOUR LOG WITH YOU TO YOUR FOLLOW UP APPOINTMENT    Signed, Corrin Parker, PA-C  10/24/2022 4:42 PM    [facility]

## 2022-10-18 ENCOUNTER — Encounter: Payer: Self-pay | Admitting: Family Medicine

## 2022-10-21 ENCOUNTER — Other Ambulatory Visit: Payer: Self-pay

## 2022-10-23 ENCOUNTER — Telehealth: Payer: Self-pay | Admitting: Family Medicine

## 2022-10-23 ENCOUNTER — Inpatient Hospital Stay: Payer: Medicaid Other | Admitting: Family Medicine

## 2022-10-23 NOTE — Telephone Encounter (Signed)
Pt was a no show for a hosp f/up with Dr Janee Morn on 10/23/22, I did send a letter.

## 2022-10-24 ENCOUNTER — Encounter: Payer: Self-pay | Admitting: Family Medicine

## 2022-10-24 ENCOUNTER — Encounter: Payer: Self-pay | Admitting: Student

## 2022-10-24 ENCOUNTER — Ambulatory Visit: Payer: Medicaid Other | Attending: Student | Admitting: Student

## 2022-10-24 VITALS — BP 178/98 | HR 59 | Ht 72.0 in | Wt 162.0 lb

## 2022-10-24 DIAGNOSIS — I480 Paroxysmal atrial fibrillation: Secondary | ICD-10-CM

## 2022-10-24 DIAGNOSIS — Z720 Tobacco use: Secondary | ICD-10-CM

## 2022-10-24 DIAGNOSIS — R0609 Other forms of dyspnea: Secondary | ICD-10-CM | POA: Diagnosis not present

## 2022-10-24 DIAGNOSIS — I739 Peripheral vascular disease, unspecified: Secondary | ICD-10-CM

## 2022-10-24 DIAGNOSIS — E785 Hyperlipidemia, unspecified: Secondary | ICD-10-CM

## 2022-10-24 DIAGNOSIS — J449 Chronic obstructive pulmonary disease, unspecified: Secondary | ICD-10-CM

## 2022-10-24 DIAGNOSIS — Z01818 Encounter for other preprocedural examination: Secondary | ICD-10-CM

## 2022-10-24 DIAGNOSIS — I5032 Chronic diastolic (congestive) heart failure: Secondary | ICD-10-CM | POA: Diagnosis not present

## 2022-10-24 DIAGNOSIS — I10 Essential (primary) hypertension: Secondary | ICD-10-CM

## 2022-10-24 DIAGNOSIS — N1832 Chronic kidney disease, stage 3b: Secondary | ICD-10-CM

## 2022-10-24 DIAGNOSIS — H539 Unspecified visual disturbance: Secondary | ICD-10-CM

## 2022-10-24 MED ORDER — NEBIVOLOL HCL 5 MG PO TABS: 5.0000 mg | ORAL_TABLET | Freq: Every day | ORAL | 6 refills | Status: DC

## 2022-10-24 NOTE — Telephone Encounter (Signed)
(  01/12/2022 same day cancellation, May 04, 2022 same day cancellation/death in family, 2022/09/20 went to ER, and 10/23/2022 no show)  No final warning sent prior. Final warning sent via mail and mychart.

## 2022-10-24 NOTE — Patient Instructions (Signed)
Medication Instructions:  The current medical regimen is effective;  continue present plan and medications as directed. Please refer to the Current Medication list given to you today.   PLEASE HAVE PHARMACY UPDATE YOUR PILL PACK  *If you need a refill on your cardiac medications before your next appointment, please call your pharmacy*  Lab Work: LIPID AND LFT TODAY If you have labs (blood work) drawn today and your tests are completely normal, you will receive your results only by:  MyChart Message (if you have MyChart) OR  A paper copy in the mail If you have any lab test that is abnormal or we need to change your treatment, we will call you to review the results.  Testing/Procedures: NONE  Follow-Up: At [facility], you and your health needs are our priority.  As part of our continuing mission to provide you with exceptional heart care, we have created designated Provider Care Teams.  These Care Teams include your primary Cardiologist (physician) and Advanced Practice Providers (APPs -  Physician Assistants and Nurse Practitioners) who all work together to provide you with the care you need, when you need it.  Your next appointment:   3 month(s)  Provider:   Chrystie Nose, MD  or Marjie Skiff, PA-C AND KEEP SCHEDULED APPT WITH Hubbard Hartshorn, NP       PLEASE SCHEDULED AN APPOINTMENT WITH DR Allyson Sabal FOR PAD APPOINTMENT   Other Instructions TAKE AND LOG YOUR BLOOD PRESSURE AND BRING YOUR LOG WITH YOU TO YOUR FOLLOW UP APPOINTMENT

## 2022-10-25 ENCOUNTER — Ambulatory Visit
Admission: RE | Admit: 2022-10-25 | Discharge: 2022-10-25 | Disposition: A | Discharge status: Home / Self Care | Payer: Medicaid Other | Source: Ambulatory Visit | Attending: Neurology | Admitting: Neurology

## 2022-10-25 ENCOUNTER — Other Ambulatory Visit: Payer: Medicaid Other

## 2022-10-25 ENCOUNTER — Other Ambulatory Visit: Payer: Self-pay

## 2022-10-25 DIAGNOSIS — G453 Amaurosis fugax: Secondary | ICD-10-CM

## 2022-10-25 DIAGNOSIS — I739 Peripheral vascular disease, unspecified: Secondary | ICD-10-CM

## 2022-10-25 DIAGNOSIS — E785 Hyperlipidemia, unspecified: Secondary | ICD-10-CM

## 2022-10-25 LAB — LIPID PANEL
Chol/HDL Ratio: 2.4 ratio (ref 0.0–5.0)
Cholesterol, Total: 190 mg/dL (ref 100–199)
HDL: 79 mg/dL (ref >39)
LDL Chol Calc (NIH): 97 mg/dL (ref 0–99)
Triglycerides: 79 mg/dL (ref 0–149)
VLDL Cholesterol Cal: 14 mg/dL (ref 5–40)

## 2022-10-25 LAB — HEPATIC FUNCTION PANEL
ALT: 8 IU/L (ref 0–44)
AST: 14 IU/L (ref 0–40)
Albumin: 4.3 g/dL (ref 3.9–4.9)
Alkaline Phosphatase: 74 IU/L (ref 44–121)
Bilirubin Total: 0.3 mg/dL (ref 0.0–1.2)
Bilirubin, Direct: 0.11 mg/dL (ref 0.00–0.40)
Total Protein: 6.8 g/dL (ref 6.0–8.5)

## 2022-10-25 MED ORDER — IOPAMIDOL (ISOVUE-370) INJECTION 76%
75.0000 mL | Freq: Once | INTRAVENOUS | Status: AC | PRN
  Administered 2022-10-25: 75 mL via INTRAVENOUS

## 2022-10-25 MED ORDER — ATORVASTATIN CALCIUM 80 MG PO TABS
80.0000 mg | ORAL_TABLET | Freq: Every day | ORAL | 3 refills | Status: AC
Start: 2022-10-25 — End: ?

## 2022-10-30 ENCOUNTER — Ambulatory Visit (INDEPENDENT_AMBULATORY_CARE_PROVIDER_SITE_OTHER): Payer: Medicaid Other | Admitting: Pulmonary Disease

## 2022-10-30 ENCOUNTER — Encounter: Payer: Self-pay | Admitting: Pulmonary Disease

## 2022-10-30 VITALS — BP 134/70 | HR 62 | Ht 72.0 in | Wt 160.2 lb

## 2022-10-30 DIAGNOSIS — C649 Malignant neoplasm of unspecified kidney, except renal pelvis: Secondary | ICD-10-CM

## 2022-10-30 DIAGNOSIS — E1022 Type 1 diabetes mellitus with diabetic chronic kidney disease: Secondary | ICD-10-CM | POA: Insufficient documentation

## 2022-10-30 DIAGNOSIS — I5033 Acute on chronic diastolic (congestive) heart failure: Secondary | ICD-10-CM

## 2022-10-30 DIAGNOSIS — R0609 Other forms of dyspnea: Secondary | ICD-10-CM

## 2022-10-30 DIAGNOSIS — N183 Chronic kidney disease, stage 3 unspecified: Secondary | ICD-10-CM

## 2022-10-30 DIAGNOSIS — I129 Hypertensive chronic kidney disease with stage 1 through stage 4 chronic kidney disease, or unspecified chronic kidney disease: Secondary | ICD-10-CM

## 2022-10-30 NOTE — Patient Instructions (Signed)
We will cancel today's appointment  Follow-up with Dr. Delton Coombes in October  Can be rescheduled for sleep concerns if needed   Encouraged to continue using his sleep device

## 2022-10-30 NOTE — Progress Notes (Signed)
Patient feels he does not know why this appointment was made  I informed him that the appointment was made for sleep disordered breathing  He states he has a CPAP at home that he uses  Does not feel he has any sleep issues  He only wanted to come back to see Dr. Delton Coombes for his breathing issues which is scheduled for October    He does not feel there is a need for today's appointment  Appointment will be canceled  Follow-up with Dr. Delton Coombes in October

## 2022-11-01 ENCOUNTER — Other Ambulatory Visit: Payer: Self-pay

## 2022-11-02 NOTE — Progress Notes (Signed)
Patient advised of Results. Patient will follow up with the Ophthalmology  office  [address] [facility], [place] [address] [place], [place] [address] Located next to [employer]  Phone: [phone] Fax: [phone]

## 2022-11-07 ENCOUNTER — Encounter (HOSPITAL_COMMUNITY): Payer: Self-pay | Admitting: Gastroenterology

## 2022-11-07 ENCOUNTER — Other Ambulatory Visit: Payer: Self-pay

## 2022-11-07 ENCOUNTER — Other Ambulatory Visit: Payer: Medicaid Other | Admitting: *Deleted

## 2022-11-07 ENCOUNTER — Other Ambulatory Visit (HOSPITAL_COMMUNITY): Payer: Self-pay

## 2022-11-07 NOTE — Patient Instructions (Signed)
Visit Information  Kevin [name] was given information about Medicaid Managed Care team care coordination services as a part of their [facility] Medicaid benefit. Kevin [name] verbally consented to engagement with the [facility] Managed Care team.   If you are experiencing a medical emergency, please call 911 or report to your local emergency department or urgent care.   If you have a non-emergency medical problem during routine business hours, please contact your provider's office and ask to speak with a nurse.   For questions related to your Healthy [facility] health plan, please call: [phone] or visit the homepage here: [email or web address]  If you would like to schedule transportation through your Healthy [facility] plan, please call the following number at least 2 days in advance of your appointment: [phone]  For information about your ride after you set it up, call Ride Assist at [phone]. Use this number to activate a Will Call pickup, or if your transportation is late for a scheduled pickup. Use this number, too, if you need to make a change or cancel a previously scheduled reservation.  If you need transportation services right away, call [phone]. The after-hours call center is staffed 24 hours to handle ride assistance and urgent reservation requests (including discharges) 365 days a year. Urgent trips include sick visits, hospital discharge requests and life-sustaining treatment.  Call the [facility] Line at [phone], at any time, 24 hours a day, 7 days a week. If you are in danger or need immediate medical attention call 911.  If you would like help to quit smoking, call 1-800-QUIT-NOW ((951) 692-2401) OR Espaol: 1-855-Djelo-Ya (2-706-237-6283) o para ms informacin haga clic aqu or Text READY to 151-761 to register via text  Kevin [name] - following are the goals we discussed in your visit today:   Goals  Addressed   None     Please see education materials related to COPD and HTN provided by MyChart link.  Patient verbalizes understanding of instructions and care plan provided today and agrees to view in MyChart. Active MyChart status and patient understanding of how to access instructions and care plan via MyChart confirmed with patient.     Telephone follow up appointment with Managed Medicaid care management team member scheduled for:12/08/22 at 10:30am  [name] RN, BSN [place]  Value-Based Care Institute [facility] RN Care Coordinator [phone]   Following is a copy of your plan of care:  Care Plan : RN Care Manager Plan of Care  Updates made by [name], RN since 11/07/2022 12:00 AM     Problem: Health Management needs related to COPD      Long-Range Goal: Development of Plan of Care to address Health Management needs related to COPD   Start Date: 05/03/2022  Expected End Date: 10/13/2022  Note:   Current Barriers:  Chronic Disease Management support and education needs related to HTN and COPD-Patient unsure of all of his medications. He is receiving 3 medications in a bubble pack, but unsure of where they are coming from. Reports feeling better and improved BP.  RNCM Clinical Goal(s):  Patient will verbalize understanding of plan for management of HTN and COPD as evidenced by patient reports take all medications exactly as prescribed and will call provider for medication related questions as evidenced by patient reports    attend all scheduled medical appointments: 10/9 with Dermatology, 10/11 for Pulmonary Function Test, 10/16 with Pulmonology and 10/30 with Cardiology as evidenced by provider documentation  through collaboration with RN Care manager, provider, and care team.   Interventions: Inter-disciplinary care team collaboration (see longitudinal plan of care) Evaluation of current treatment plan related to  self management and  patient's adherence to plan as established by provider Provided therapeutic listening Discussed Opthalmology referral-patient scheduled with [facility] [phone] on 01/01/23 Assisted patient with transferring medications to [facility] for adherence packaging and request delivery   Hypertension Interventions:  (Status:  Goal on track:  Yes.) Long Term Goal Last practice recorded BP readings:  BP Readings from Last 3 Encounters:  10/30/22 134/70  10/24/22 (!) 178/98  10/13/22 (!) 163/98   Most recent eGFR/CrCl:  Lab Results  Component Value Date   EGFR 36 (L) 07/06/2022    No components found for: "CRCL"  Evaluation of current treatment plan related to hypertension self management and patient's adherence to plan as established by provider Reviewed medications with patient and discussed importance of compliance Discussed complications of poorly controlled blood pressure such as heart disease, stroke, circulatory complications, vision complications, kidney impairment, sexual dysfunction Assessed social determinant of health barriers Advised patient to take all medications to all provider appointments Discussed the importance of smoking cessation, improving diet and exercise in controlling HTN Assisted with scheduling with Nephrology per referral with [facility] [phone], 02/28/23 at 10am    Smoking Cessation Interventions:  (Status:  Condition stable.  Not addressed this visit.) Long Term Goal currently smoking 3-5 cigarettes/day Reviewed smoking history:  tobacco abuse of >20 years; currently smoking 1-2 ppd Previous quit attempts, unsuccessful several /successful using none  Reports smoking within 30 minutes of waking up Reports triggers to smoke include: stress Reports motivation to quit smoking includes: family and health On a scale of 1-10, reports MOTIVATION to quit is 10 On a scale of 1-10, reports CONFIDENCE in quitting is 10  Evaluation of current  treatment plan reviewed Provided patient with printed smoking cessation educational materials Provided contact information for [place] Quit Line (1-800-QUIT-NOW) Assessed social determinant of health barriers   COPD: (Status: Goal on Track (progressing): YES.) Long Term Goal  Reviewed medications with patient Provided instruction about proper use of medications used for management of COPD including inhalers Advised patient to self assesses COPD action plan zone and make appointment with provider if in the yellow zone for 48 hours without improvement Advised patient to engage in light exercise as tolerated 3-5 days a week to aid in the the management of COPD Provided education about and advised patient to utilize infection prevention strategies to reduce risk of respiratory infection Discussed the importance of adequate rest and management of fatigue with COPD Assessed social determinant of health barriers Advised patient to take all medications and inhalers to upcoming Pulmonology visit for medication reconcilliation  Patient Goals/Self-Care Activities: Take medications as prescribed   Attend all scheduled provider appointments Call pharmacy for medication refills 3-7 days in advance of running out of medications Call provider office for new concerns or questions  Work with the social worker to address care coordination needs and will continue to work with the clinical team to address health care and disease management related needs identify and avoid work-related triggers develop a rescue plan develop a new routine to improve sleep get at least 7 to 8 hours of sleep at night

## 2022-11-07 NOTE — Patient Outreach (Signed)
Medicaid Managed Care   Nurse Care Manager Note  11/07/2022 Name:  Kevin [name] MRN:  [id number] DOB:  [birth year]  Kevin [name] is an 67 y.o. year old male who is [name] primary patient of Kevin Gunner, MD.  The [facility] Managed Care Coordination team was consulted for assistance with:    HTN COPD  Kevin [name] was given information about Medicaid Managed Care Coordination team services today. [name] Patient agreed to services and verbal consent obtained.  Engaged with patient by telephone for follow up visit in response to provider referral for case management and/or care coordination services.   Assessments/Interventions:  Review of past medical history, allergies, medications, health status, including review of consultants reports, laboratory and other test data, was performed as part of comprehensive evaluation and provision of chronic care management services.  SDOH (Social Determinants of Health) assessments and interventions performed: SDOH Interventions    Flowsheet Row Office Visit from 08/08/2022 in [facility] Patient Outreach Telephone from 08/07/2022 in [place] POPULATION HEALTH DEPARTMENT Patient Outreach Telephone from 07/04/2022 in [place] POPULATION HEALTH DEPARTMENT Patient Outreach Telephone from 06/02/2022 in [place] POPULATION HEALTH DEPARTMENT Patient Outreach Telephone from 05/04/2022 in [place] POPULATION HEALTH DEPARTMENT Patient Outreach Telephone from 05/03/2022 in [place] POPULATION HEALTH DEPARTMENT  SDOH Interventions        Food Insecurity Interventions -- Intervention Not Indicated -- -- -- Intervention Not Indicated  Housing Interventions -- Intervention Not Indicated -- Intervention Not Indicated -- --  Transportation Interventions -- -- Payor Benefit  [Provided with [phone]] Intervention Not Indicated -- Payor Benefit  [employer] [phone]   Utilities Interventions -- Other (Comment)  [Advised patient to contact [employer] for increased bill and inquire about financial assistance programs] -- -- -- --  Depression Interventions/Treatment  Patient refuses Treatment -- -- -- -- --  Stress Interventions -- -- -- -- [employer], Provide Counseling --       Care Plan  No Known Allergies  Medications Reviewed Today     Reviewed by Kevin Dach, RN (Registered Nurse) on 11/07/22 at 1149  Med List Status: <None>   Medication Order Taking? Sig Documenting Provider Last Dose Status Informant  acetylcysteine (MUCOMYST) 10 % nebulizer solution [id number] Yes Take 4 mLs by nebulization every 4 (four) hours. Kevin Gunner, MD Taking Active Self           Med Note Kevin [name], Kevin [name]   Tue Nov 07, 2022 10:51 AM) Using as needed, as advised by Pulmonology  albuterol (VENTOLIN HFA) 108 (90 Base) MCG/ACT inhaler [id number] Yes Inhale 2 puffs into the lungs every 4 (four) hours as needed for wheezing or shortness of breath. Kevin Fossa, MD Taking Active Self  amLODipine-valsartan [facility]) 10-320 MG tablet [id number] Yes Take 1 tablet by mouth daily. Kevin [name] Ruffini, MD Taking Active   atorvastatin (LIPITOR) 80 MG tablet [id number] Yes Take 1 tablet (80 mg total) by mouth daily. Kevin Parker, PA-C Taking Active   dolutegravir-lamiVUDine (DOVATO) 50-300 MG tablet [id number] Yes Take 1 tablet by mouth daily. Kevin [name], Kevin Grinder, MD Taking Active Self  DULERA 100-5 MCG/ACT Sandrea Matte [id number] Yes SMARTSIG:2 Puff(s) Via Inhaler Morning-Night [provider] Taking Active            Med Note Kevin [name], Carlton Buskey [name]   Tue Nov 07, 2022 11:00 AM) Taking as needed  Fluticasone-Umeclidin-Vilant Kevin [name]) 200-62.5-25 MCG/ACT AEPB [id number] Yes Inhale  1 puff into the lungs daily. Kevin Chapel, NP Taking Active Self  hydrALAZINE (APRESOLINE) 100 MG tablet [id number] Yes Take 1 tablet (100 mg total) by mouth 3  (three) times daily. Kevin [name] Ruffini, MD Taking Active   hydrOXYzine (ATARAX) 25 MG tablet [id number] Yes Take 25 mg by mouth as needed for anxiety. [provider] Taking Active Self  ipratropium-albuterol (DUONEB) 0.5-2.5 (3) MG/3ML SOLN [id number] Yes Take 3 mLs by nebulization every 6 (six) hours as needed (wheezing or shortness of breath). Kevin Sprout, MD Taking Active Self           Med Note Roxan Hockey, [place] R   Tue Oct 24, 2022  1:25 PM)    metoprolol tartrate (LOPRESSOR) 25 MG tablet [id number] No Take 1 tablet (25 mg total) by mouth 2 (two) times daily.  Patient not taking: Reported on 11/07/2022   Kevin [name] Ruffini, MD Not Taking Active   montelukast (SINGULAIR) 10 MG tablet [id number] Yes Take 10 mg by mouth at bedtime. [provider] Taking Active   nebivolol (BYSTOLIC) 5 MG tablet [id number] No Take 1 tablet (5 mg total) by mouth daily.  Patient not taking: Reported on 11/07/2022   Kevin Parker, PA-C Not Taking Active   oxyCODONE-acetaminophen (PERCOCET) 7.5-325 MG tablet [id number] Yes Take 1 tablet by mouth 3 (three) times daily as needed for moderate pain. 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) 10/30/2022   COPD with acute exacerbation (HCC) 10/13/2022   Amaurosis fugax 08/18/2022   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   Skin nodule 08/08/2022   Lung nodule, multiple 07/26/2022   Bloody stool 07/26/2022   Lipoma 07/06/2022   Chronic low back pain 04/29/2022   Chronic heart failure with preserved ejection fraction (HFpEF) (HCC) 12/27/2021   HLD (hyperlipidemia) 12/27/2021   Chest pain 12/27/2021   Elevated troponin 12/27/2021   Acute diastolic heart failure (HCC) 07/14/2021   Right renal mass 06/29/2021   Renal lesion 06/29/2021   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   Hypertensive crisis    Acute respiratory failure  (HCC)    Dyspnea 04/04/2019   Callus of foot 07/10/2018   Hypertensive urgency 08/15/2017   CAD (coronary artery disease) 10/19/2016   Easy bruising 08/11/2016   Claudication in peripheral vascular disease (HCC) 06/05/2016   Stage 3 chronic kidney disease (HCC)    Peripheral arterial disease (HCC) 05/09/2016   Acute on chronic diastolic CHF (congestive heart failure), NYHA class 3 (HCC) 03/24/2016   OSA (obstructive sleep apnea) 03/24/2016   Essential hypertension 02/18/2016   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   Shortness of breath 02/07/2016   PAF (paroxysmal atrial fibrillation) (HCC) 02/07/2016   Tobacco abuse 02/07/2016   Acute on chronic renal insufficiency 02/07/2016   COPD with chronic bronchitis and emphysema (HCC) 02/07/2016   Hypertensive emergency 02/07/2016   Blurry vision, bilateral    HIV (human immunodeficiency virus infection) (HCC) 08/27/2004    Conditions to be addressed/monitored per PCP order:  HTN and COPD  Care Plan : RN Care Manager Plan of Care  Updates made by Kevin Dach, RN since 11/07/2022 12:00 AM     Problem: Health Management needs related to COPD      Long-Range Goal: Development of Plan of Care to address Health Management needs related to COPD   Start Date:  05/03/2022  Expected End Date: 10/13/2022  Note:   Current Barriers:  Chronic Disease Management support and education needs related to HTN and COPD-Patient unsure of all of his medications. He is receiving 3 medications in [name] bubble pack, but unsure of where they are coming from. Reports feeling better and improved BP.  RNCM Clinical Goal(s):  Patient will verbalize understanding of plan for management of HTN and COPD as evidenced by patient reports take all medications exactly as prescribed and will call provider for medication related questions as evidenced by patient reports    attend all scheduled medical appointments: 10/9 with Dermatology, 10/11 for Pulmonary Function  Test, 10/16 with Pulmonology and 10/30 with Cardiology as evidenced by provider documentation        through collaboration with RN Care manager, provider, and care team.   Interventions: Inter-disciplinary care team collaboration (see longitudinal plan of care) Evaluation of current treatment plan related to  self management and patient's adherence to plan as established by provider Provided therapeutic listening Discussed Opthalmology referral-patient scheduled with [facility] [phone] on 01/01/23 Assisted patient with transferring medications to [facility] for adherence packaging and request delivery   Hypertension Interventions:  (Status:  Goal on track:  Yes.) Long Term Goal Last practice recorded BP readings:  BP Readings from Last 3 Encounters:  10/30/22 134/70  10/24/22 (!) 178/98  10/13/22 (!) 163/98   Most recent eGFR/CrCl:  Lab Results  Component Value Date   EGFR 36 (L) 07/06/2022    No components found for: "CRCL"  Evaluation of current treatment plan related to hypertension self management and patient's adherence to plan as established by provider Reviewed medications with patient and discussed importance of compliance Discussed complications of poorly controlled blood pressure such as heart disease, stroke, circulatory complications, vision complications, kidney impairment, sexual dysfunction Assessed social determinant of health barriers Advised patient to take all medications to all provider appointments Discussed the importance of smoking cessation, improving diet and exercise in controlling HTN Assisted with scheduling with Nephrology per referral with [facility] [phone], 02/28/23 at 10am    Smoking Cessation Interventions:  (Status:  Condition stable.  Not addressed this visit.) Long Term Goal currently smoking 3-5 cigarettes/day Reviewed smoking history:  tobacco abuse of >20 years; currently smoking 1-2 ppd Previous quit attempts,  unsuccessful several /successful using none  Reports smoking within 30 minutes of waking up Reports triggers to smoke include: stress Reports motivation to quit smoking includes: family and health On [name] scale of 1-10, reports MOTIVATION to quit is 10 On [name] scale of 1-10, reports CONFIDENCE in quitting is 10  Evaluation of current treatment plan reviewed Provided patient with printed smoking cessation educational materials Provided contact information for [place] Quit Line (1-800-QUIT-NOW) Assessed social determinant of health barriers   COPD: (Status: Goal on Track (progressing): YES.) Long Term Goal  Reviewed medications with patient Provided instruction about proper use of medications used for management of COPD including inhalers Advised patient to self assesses COPD action plan zone and make appointment with provider if in the yellow zone for 48 hours without improvement Advised patient to engage in light exercise as tolerated 3-5 days [name] week to aid in the the management of COPD Provided education about and advised patient to utilize infection prevention strategies to reduce risk of respiratory infection Discussed the importance of adequate rest and management of fatigue with COPD Assessed social determinant of health barriers Advised patient to take all medications and inhalers to upcoming Pulmonology visit  for medication reconcilliation  Patient Goals/Self-Care Activities: Take medications as prescribed   Attend all scheduled provider appointments Call pharmacy for medication refills 3-7 days in advance of running out of medications Call provider office for new concerns or questions  Work with the social worker to address care coordination needs and will continue to work with the clinical team to address health care and disease management related needs identify and avoid work-related triggers develop [name] rescue plan develop [name] new routine to improve sleep get at least 7 to 8 hours of  sleep at night       Follow Up:  Patient agrees to Care Plan and Follow-up.  Plan: The Managed Medicaid care management team will reach out to the patient again over the next 30 days.  Date/time of next scheduled RN care management/care coordination outreach:  12/08/22 at 10:30am  [name] RN, BSN [place]  Value-Based Care Institute [facility] RN Care Coordinator [phone]

## 2022-11-08 ENCOUNTER — Telehealth: Payer: Self-pay | Admitting: Emergency Medicine

## 2022-11-08 ENCOUNTER — Other Ambulatory Visit (HOSPITAL_COMMUNITY): Payer: Self-pay

## 2022-11-08 NOTE — Telephone Encounter (Signed)
Barab from [facility]  is calling because this patient needs surgical clearance for colonoscopy which is on the first of October.

## 2022-11-08 NOTE — Telephone Encounter (Signed)
Dr Wynona Neat- pt needing pulmonary clearance for a colonoscopy  He seen you for a short visit on 10/30/22  His procedure is scheduled for 11/14/22  He is not scheduled for a visit with Byrum until after the date his colonoscopy is scheduled   Please advise if you can add risk assessment to last visit, thanks

## 2022-11-08 NOTE — Progress Notes (Signed)
Pre op evaluation Name:Kevin Virgina Evener MD Cardiologist-Hilty MD  EKG-10/12/22 Echo-12/27/21 Cath-2018 Stress-09/26/22 ICD/PM-n/a Blood thinner-n/a GLP-1-n/a  Hx: CHF, murmur, HTN, COPD, CKD3 renal cell carcinoma, Afib. Was hospitalized 8/29-30 for COPD exacerbation. Patient states feeling good, breathing is back to normal, no use of 02 or breathing treatments needed, no use of equipment, able to walk normal. Did see cardiologist 9/10 where they gave clearance for endo procedure but because of dyspnea they recommended pulm to clear as well. He had a pulm appt 9/16 but was for cpap not a routine visit where he told them he already had a cpap. Patient thought appt was for breathing but told him he has a routine check in october they will discuss with him at that appt.  Anesthesia Review: Yes

## 2022-11-09 ENCOUNTER — Other Ambulatory Visit: Payer: Self-pay

## 2022-11-09 ENCOUNTER — Other Ambulatory Visit (HOSPITAL_COMMUNITY): Payer: Self-pay

## 2022-11-09 MED ORDER — ATORVASTATIN CALCIUM 80 MG PO TABS
80.0000 mg | ORAL_TABLET | Freq: Every day | ORAL | 3 refills | Status: DC
  Filled 2022-11-09 – 2022-11-30 (×2): qty 30, 30d supply, fill #0
  Filled 2022-12-25: qty 30, 30d supply, fill #1
  Filled 2023-01-22 – 2023-01-23 (×2): qty 30, 30d supply, fill #2
  Filled 2023-02-06: qty 30, 30d supply, fill #3
  Filled ????-??-??: fill #3

## 2022-11-09 MED ORDER — NEBIVOLOL HCL 5 MG PO TABS
5.0000 mg | ORAL_TABLET | Freq: Every day | ORAL | 6 refills | Status: DC
  Filled 2022-11-09 – 2022-11-30 (×2): qty 30, 30d supply, fill #0
  Filled 2022-12-25: qty 30, 30d supply, fill #1
  Filled 2023-01-22 – 2023-01-23 (×2): qty 30, 30d supply, fill #2
  Filled 2023-02-06: qty 30, 30d supply, fill #3
  Filled ????-??-??: fill #3

## 2022-11-09 MED ORDER — DOXYCYCLINE HYCLATE 100 MG PO TABS: 100.0000 mg | ORAL_TABLET | Freq: Two times a day (BID) | ORAL | 0 refills | Status: DC

## 2022-11-09 MED ORDER — ISOSORBIDE MONONITRATE ER 60 MG PO TB24: 60.0000 mg | ORAL_TABLET | Freq: Every day | ORAL | 0 refills | Status: DC

## 2022-11-09 MED ORDER — HYDRALAZINE HCL 50 MG PO TABS
100.0000 mg | ORAL_TABLET | Freq: Three times a day (TID) | ORAL | 3 refills | Status: DC
  Filled 2022-11-09: qty 180, 30d supply, fill #0

## 2022-11-09 MED ORDER — NALOXONE HCL 4 MG/0.1ML NA LIQD
NASAL | 0 refills | Status: DC
  Filled 2022-11-09: qty 2, 2d supply, fill #0

## 2022-11-09 MED ORDER — METOPROLOL TARTRATE 25 MG PO TABS
25.0000 mg | ORAL_TABLET | Freq: Two times a day (BID) | ORAL | 5 refills | Status: DC
  Filled 2022-11-09: qty 60, 30d supply, fill #0

## 2022-11-09 MED ORDER — HYDRALAZINE HCL 100 MG PO TABS
100.0000 mg | ORAL_TABLET | Freq: Three times a day (TID) | ORAL | 5 refills | Status: DC
  Filled 2022-11-30 – 2022-12-25 (×2): qty 90, 30d supply, fill #0
  Filled 2023-01-22 – 2023-01-23 (×2): qty 90, 30d supply, fill #1
  Filled 2023-02-06: qty 90, 30d supply, fill #2
  Filled ????-??-??: fill #2

## 2022-11-09 MED ORDER — LIDOCAINE 5 % EX PTCH: 1.0000 | MEDICATED_PATCH | Freq: Every day | CUTANEOUS | 0 refills | Status: DC

## 2022-11-09 MED ORDER — ATORVASTATIN CALCIUM 40 MG PO TABS
40.0000 mg | ORAL_TABLET | Freq: Every day | ORAL | 5 refills | Status: DC
  Filled 2022-11-09: qty 30, 30d supply, fill #0

## 2022-11-09 MED ORDER — VALSARTAN 320 MG PO TABS
320.0000 mg | ORAL_TABLET | Freq: Every day | ORAL | 1 refills | Status: DC
  Filled 2022-11-09: qty 30, 30d supply, fill #0

## 2022-11-09 MED ORDER — MUCINEX DM 30-600 MG PO TB12: 1.0000 | ORAL_TABLET | Freq: Two times a day (BID) | ORAL | 3 refills | Status: DC

## 2022-11-09 MED ORDER — ASPIRIN 81 MG PO TBEC
81.0000 mg | DELAYED_RELEASE_TABLET | Freq: Every day | ORAL | 12 refills | Status: DC
  Filled 2022-11-09 – 2022-11-30 (×2): qty 30, 30d supply, fill #0
  Filled 2022-12-25: qty 30, 30d supply, fill #1
  Filled 2023-01-22 – 2023-01-23 (×2): qty 30, 30d supply, fill #2
  Filled 2023-02-06 – 2023-02-20 (×3): qty 30, 30d supply, fill #3
  Filled 2023-03-12 – 2023-03-15 (×2): qty 30, 30d supply, fill #4
  Filled 2023-03-29 – 2023-04-11 (×3): qty 30, 30d supply, fill #5
  Filled 2023-04-19 – 2023-05-07 (×2): qty 30, 30d supply, fill #6

## 2022-11-09 MED ORDER — VALSARTAN 160 MG PO TABS: 160.0000 mg | ORAL_TABLET | Freq: Every day | ORAL | 0 refills | Status: DC

## 2022-11-09 MED ORDER — CARVEDILOL 6.25 MG PO TABS
6.2500 mg | ORAL_TABLET | Freq: Two times a day (BID) | ORAL | 3 refills | Status: DC
  Filled 2022-11-09: qty 60, 30d supply, fill #0

## 2022-11-09 MED ORDER — AMLODIPINE-VALSARTAN-HCTZ 10-320-25 MG PO TABS: 1.0000 | ORAL_TABLET | Freq: Every day | ORAL | 5 refills | Status: DC

## 2022-11-09 MED ORDER — VALSARTAN 80 MG PO TABS: 80.0000 mg | ORAL_TABLET | Freq: Every day | ORAL | 0 refills | Status: DC

## 2022-11-09 MED ORDER — AMLODIPINE BESYLATE 10 MG PO TABS
10.0000 mg | ORAL_TABLET | Freq: Every day | ORAL | 0 refills | Status: DC
  Filled 2022-11-09: qty 30, 30d supply, fill #0

## 2022-11-09 MED ORDER — FLUTICASONE-SALMETEROL 500-50 MCG/ACT IN AEPB
1.0000 | INHALATION_SPRAY | Freq: Two times a day (BID) | RESPIRATORY_TRACT | 0 refills | Status: DC
  Filled 2022-11-09 – 2022-11-30 (×2): qty 60, 30d supply, fill #0

## 2022-11-09 MED ORDER — ACETYLCYSTEINE 10 % IN SOLN: 4.0000 mL | RESPIRATORY_TRACT | 12 refills | Status: DC

## 2022-11-09 MED ORDER — NICOTINE 14 MG/24HR TD PT24: 14.0000 mg | MEDICATED_PATCH | Freq: Every day | TRANSDERMAL | 0 refills | Status: DC

## 2022-11-09 MED ORDER — FLUTICASONE-SALMETEROL 230-21 MCG/ACT IN AERO
2.0000 | INHALATION_SPRAY | Freq: Two times a day (BID) | RESPIRATORY_TRACT | 2 refills | Status: DC
  Filled 2022-11-09: qty 12, 30d supply, fill #0

## 2022-11-10 ENCOUNTER — Other Ambulatory Visit: Payer: Self-pay

## 2022-11-10 ENCOUNTER — Other Ambulatory Visit (HOSPITAL_COMMUNITY): Payer: Self-pay

## 2022-11-10 MED ORDER — BENZONATATE 200 MG PO CAPS: 200.0000 mg | ORAL_CAPSULE | Freq: Three times a day (TID) | ORAL | 1 refills | Status: DC | PRN

## 2022-11-10 MED ORDER — TRELEGY ELLIPTA 100-62.5-25 MCG/ACT IN AEPB: 1.0000 | INHALATION_SPRAY | Freq: Every day | RESPIRATORY_TRACT | 5 refills | Status: DC

## 2022-11-10 MED ORDER — HYDRALAZINE HCL 25 MG PO TABS: 25.0000 mg | ORAL_TABLET | Freq: Three times a day (TID) | ORAL | 3 refills | Status: DC

## 2022-11-10 MED ORDER — GABAPENTIN 300 MG PO CAPS: ORAL_CAPSULE | ORAL | 1 refills | Status: DC

## 2022-11-10 MED ORDER — HYDRALAZINE HCL 50 MG PO TABS: 50.0000 mg | ORAL_TABLET | Freq: Three times a day (TID) | ORAL | 3 refills | Status: DC

## 2022-11-10 MED ORDER — CARVEDILOL 12.5 MG PO TABS: 12.5000 mg | ORAL_TABLET | Freq: Two times a day (BID) | ORAL | 3 refills | Status: DC

## 2022-11-10 MED ORDER — DICLOFENAC SODIUM 1 % EX GEL: 1.0000 | Freq: Two times a day (BID) | CUTANEOUS | 11 refills | Status: DC

## 2022-11-10 MED ORDER — CARVEDILOL 3.125 MG PO TABS: 3.1250 mg | ORAL_TABLET | Freq: Two times a day (BID) | ORAL | 0 refills | Status: DC

## 2022-11-10 MED ORDER — FLUTICASONE-SALMETEROL 230-21 MCG/ACT IN AERO: 2.0000 | INHALATION_SPRAY | Freq: Two times a day (BID) | RESPIRATORY_TRACT | 2 refills | Status: DC

## 2022-11-10 MED ORDER — TRELEGY ELLIPTA 200-62.5-25 MCG/ACT IN AEPB
1.0000 | INHALATION_SPRAY | Freq: Every day | RESPIRATORY_TRACT | 5 refills | Status: DC
  Filled 2022-11-10 – 2022-11-21 (×4): qty 60, 30d supply, fill #0
  Filled 2022-12-11 – 2022-12-14 (×2): qty 60, 30d supply, fill #1
  Filled 2023-06-06: qty 60, 30d supply, fill #2
  Filled 2023-06-22 – 2023-06-29 (×3): qty 60, 30d supply, fill #3
  Filled 2023-07-18 – 2023-07-24 (×3): qty 60, 30d supply, fill #4

## 2022-11-10 MED ORDER — AMLODIPINE BESYLATE 10 MG PO TABS: 10.0000 mg | ORAL_TABLET | Freq: Every day | ORAL | 3 refills | Status: DC

## 2022-11-10 MED ORDER — AMLODIPINE BESYLATE 5 MG PO TABS: 5.0000 mg | ORAL_TABLET | Freq: Every day | ORAL | 0 refills | Status: DC

## 2022-11-10 MED ORDER — METOPROLOL TARTRATE 25 MG PO TABS: 25.0000 mg | ORAL_TABLET | Freq: Two times a day (BID) | ORAL | 3 refills | Status: DC

## 2022-11-10 MED ORDER — CARVEDILOL 6.25 MG PO TABS: 6.2500 mg | ORAL_TABLET | Freq: Two times a day (BID) | ORAL | 3 refills | Status: DC

## 2022-11-10 MED ORDER — MONTELUKAST SODIUM 10 MG PO TABS
10.0000 mg | ORAL_TABLET | Freq: Every day | ORAL | 5 refills | Status: DC
  Filled 2022-11-30: qty 30, 30d supply, fill #0
  Filled 2022-12-25: qty 30, 30d supply, fill #1
  Filled 2023-01-22 – 2023-01-23 (×2): qty 30, 30d supply, fill #2

## 2022-11-10 MED ORDER — PANTOPRAZOLE SODIUM 40 MG PO TBEC: 40.0000 mg | DELAYED_RELEASE_TABLET | Freq: Every day | ORAL | 0 refills | Status: DC

## 2022-11-10 NOTE — Telephone Encounter (Signed)
The patient came in for a visit on 9/16  -He stated to me that he did not know why the appointment was scheduled and did not see a need for the appointment  He was not evaluated by myself as he stated he only wanted to come in and see Dr. Delton Coombes at his scheduled appointment  I did not evaluate the patient

## 2022-11-13 ENCOUNTER — Telehealth: Payer: Self-pay

## 2022-11-13 DIAGNOSIS — I1A0 Resistant hypertension: Secondary | ICD-10-CM

## 2022-11-13 NOTE — Telephone Encounter (Signed)
Pt declined visit with Dr.O at the visit. Pt stated he would only see DR.Byrum. sending as a fyi for pt next OV, pt will need surgical clearance

## 2022-11-14 ENCOUNTER — Ambulatory Visit (HOSPITAL_COMMUNITY): Admission: RE | Admit: 2022-11-14 | Payer: Medicaid Other | Source: Home / Self Care | Admitting: Gastroenterology

## 2022-11-14 SURGERY — COLONOSCOPY WITH PROPOFOL
Anesthesia: Monitor Anesthesia Care | Laterality: Bilateral

## 2022-11-14 NOTE — Telephone Encounter (Signed)
Pt has ov with Byrum 11/29/22 Will wait until that note is complete to send to [facility]

## 2022-11-15 NOTE — Telephone Encounter (Signed)
Thank you :)

## 2022-11-15 NOTE — Progress Notes (Signed)
11/15/2022  Patient ID: Kevin [name], male   DOB: 1955-09-13, 67 y.o.   MRN: [id number]  Request received to assist with medication reconciliation.  Patient is in the process of transferring from [facility] pharmacy to [facility] for compliance packaging and home delivery.  When prescription transfers were received from [facility], many medications were duplicate therapy; and it was unclear patient's current regimen.  Reviewed provider notes and contacted patient to see what he is currently taking.  Consulting Dr. Delton Coombes with pulmonology to clarify current inhaler regimen and consulting Marjie Skiff with cardiology to clarify HTN regimen.  Lenna Gilford, PharmD, DPLA

## 2022-11-16 ENCOUNTER — Encounter: Payer: Self-pay | Admitting: Pharmacist

## 2022-11-16 ENCOUNTER — Other Ambulatory Visit: Payer: Self-pay

## 2022-11-17 ENCOUNTER — Telehealth: Payer: Self-pay | Admitting: Family Medicine

## 2022-11-17 ENCOUNTER — Other Ambulatory Visit: Payer: Self-pay | Admitting: Family Medicine

## 2022-11-17 ENCOUNTER — Other Ambulatory Visit (HOSPITAL_COMMUNITY): Payer: Self-pay

## 2022-11-17 ENCOUNTER — Other Ambulatory Visit: Payer: Self-pay

## 2022-11-17 MED ORDER — AMLODIPINE BESYLATE-VALSARTAN 10-320 MG PO TABS
1.0000 | ORAL_TABLET | Freq: Every day | ORAL | 5 refills | Status: DC
  Filled 2022-11-17 – 2022-11-30 (×2): qty 30, 30d supply, fill #0
  Filled 2022-12-25: qty 30, 30d supply, fill #1
  Filled 2023-01-22 – 2023-01-23 (×2): qty 30, 30d supply, fill #2
  Filled 2023-02-06 – 2023-02-20 (×3): qty 30, 30d supply, fill #3
  Filled 2023-03-12 – 2023-03-15 (×2): qty 30, 30d supply, fill #4
  Filled 2023-03-29 – 2023-04-11 (×3): qty 30, 30d supply, fill #5

## 2022-11-17 NOTE — Telephone Encounter (Signed)
Refill request    Pt is completely out of his Rx #: [id number]  albuterol (VENTOLIN HFA) 108 (90 Base) MCG/ACT inhaler [[id number]]   He is asking if it can be filled today.  [facility] [address], [place] [address] Phone: [phone]  Fax: [phone]

## 2022-11-18 ENCOUNTER — Other Ambulatory Visit (HOSPITAL_COMMUNITY): Payer: Self-pay

## 2022-11-18 ENCOUNTER — Other Ambulatory Visit: Payer: Self-pay

## 2022-11-18 ENCOUNTER — Other Ambulatory Visit (HOSPITAL_BASED_OUTPATIENT_CLINIC_OR_DEPARTMENT_OTHER): Payer: Self-pay

## 2022-11-18 ENCOUNTER — Emergency Department (HOSPITAL_COMMUNITY)
Admission: EM | Admit: 2022-11-18 | Discharge: 2022-11-18 | Disposition: A | Discharge status: Home / Self Care | Payer: Medicaid Other | Attending: Emergency Medicine | Admitting: Emergency Medicine

## 2022-11-18 ENCOUNTER — Emergency Department (HOSPITAL_COMMUNITY): Payer: Medicaid Other

## 2022-11-18 ENCOUNTER — Encounter (HOSPITAL_COMMUNITY): Payer: Self-pay | Admitting: *Deleted

## 2022-11-18 DIAGNOSIS — Z7951 Long term (current) use of inhaled steroids: Secondary | ICD-10-CM | POA: Insufficient documentation

## 2022-11-18 DIAGNOSIS — Z21 Asymptomatic human immunodeficiency virus [HIV] infection status: Secondary | ICD-10-CM | POA: Insufficient documentation

## 2022-11-18 DIAGNOSIS — R0602 Shortness of breath: Secondary | ICD-10-CM | POA: Diagnosis present

## 2022-11-18 DIAGNOSIS — J441 Chronic obstructive pulmonary disease with (acute) exacerbation: Secondary | ICD-10-CM | POA: Diagnosis not present

## 2022-11-18 DIAGNOSIS — Z20822 Contact with and (suspected) exposure to covid-19: Secondary | ICD-10-CM | POA: Insufficient documentation

## 2022-11-18 DIAGNOSIS — Z79899 Other long term (current) drug therapy: Secondary | ICD-10-CM | POA: Insufficient documentation

## 2022-11-18 DIAGNOSIS — I5030 Unspecified diastolic (congestive) heart failure: Secondary | ICD-10-CM | POA: Insufficient documentation

## 2022-11-18 DIAGNOSIS — I110 Hypertensive heart disease with heart failure: Secondary | ICD-10-CM | POA: Diagnosis not present

## 2022-11-18 LAB — I-STAT VENOUS BLOOD GAS, ED
Acid-base deficit: 3 mmol/L — ABNORMAL HIGH (ref 0.0–2.0)
Bicarbonate: 19.6 mmol/L — ABNORMAL LOW (ref 20.0–28.0)
Calcium, Ion: 1.18 mmol/L (ref 1.15–1.40)
HCT: 33 % — ABNORMAL LOW (ref 39.0–52.0)
Hemoglobin: 11.2 g/dL — ABNORMAL LOW (ref 13.0–17.0)
O2 Saturation: 98 %
Potassium: 4.2 mmol/L (ref 3.5–5.1)
Sodium: 142 mmol/L (ref 135–145)
TCO2: 20 mmol/L — ABNORMAL LOW (ref 22–32)
pCO2, Ven: 28.5 mm[Hg] — ABNORMAL LOW (ref 44–60)
pH, Ven: 7.446 — ABNORMAL HIGH (ref 7.25–7.43)
pO2, Ven: 98 mm[Hg] — ABNORMAL HIGH (ref 32–45)

## 2022-11-18 LAB — CBC WITH DIFFERENTIAL/PLATELET
Abs Immature Granulocytes: 0.03 10*3/uL (ref 0.00–0.07)
Basophils Absolute: 0.1 10*3/uL (ref 0.0–0.1)
Basophils Relative: 1 %
Eosinophils Absolute: 0.6 10*3/uL — ABNORMAL HIGH (ref 0.0–0.5)
Eosinophils Relative: 8 %
HCT: 32.4 % — ABNORMAL LOW (ref 39.0–52.0)
Hemoglobin: 11 g/dL — ABNORMAL LOW (ref 13.0–17.0)
Immature Granulocytes: 0 %
Lymphocytes Relative: 29 %
Lymphs Abs: 2.4 10*3/uL (ref 0.7–4.0)
MCH: 32.6 pg (ref 26.0–34.0)
MCHC: 34 g/dL (ref 30.0–36.0)
MCV: 96.1 fL (ref 80.0–100.0)
Monocytes Absolute: 0.6 10*3/uL (ref 0.1–1.0)
Monocytes Relative: 8 %
Neutro Abs: 4.5 10*3/uL (ref 1.7–7.7)
Neutrophils Relative %: 54 %
Platelets: 314 10*3/uL (ref 150–400)
RBC: 3.37 MIL/uL — ABNORMAL LOW (ref 4.22–5.81)
RDW: 14.9 % (ref 11.5–15.5)
WBC: 8.2 10*3/uL (ref 4.0–10.5)
nRBC: 0 % (ref 0.0–0.2)

## 2022-11-18 LAB — BASIC METABOLIC PANEL
Anion gap: 8 (ref 5–15)
BUN: 14 mg/dL (ref 8–23)
CO2: 20 mmol/L — ABNORMAL LOW (ref 22–32)
Calcium: 8.7 mg/dL — ABNORMAL LOW (ref 8.9–10.3)
Chloride: 113 mmol/L — ABNORMAL HIGH (ref 98–111)
Creatinine, Ser: 1.81 mg/dL — ABNORMAL HIGH (ref 0.61–1.24)
GFR, Estimated: 41 mL/min — ABNORMAL LOW (ref >60)
Glucose, Bld: 76 mg/dL (ref 70–99)
Potassium: 4.2 mmol/L (ref 3.5–5.1)
Sodium: 141 mmol/L (ref 135–145)

## 2022-11-18 LAB — SARS CORONAVIRUS 2 BY RT PCR: SARS Coronavirus 2 by RT PCR: NEGATIVE

## 2022-11-18 LAB — BRAIN NATRIURETIC PEPTIDE: B Natriuretic Peptide: 97.5 pg/mL (ref 0.0–100.0)

## 2022-11-18 MED ORDER — DOXYCYCLINE HYCLATE 100 MG PO CAPS
100.0000 mg | ORAL_CAPSULE | Freq: Two times a day (BID) | ORAL | 0 refills | Status: DC
  Filled 2022-11-18: qty 14, 7d supply, fill #0

## 2022-11-18 MED ORDER — ALBUTEROL SULFATE (2.5 MG/3ML) 0.083% IN NEBU
10.0000 mg/h | INHALATION_SOLUTION | Freq: Once | RESPIRATORY_TRACT | Status: AC
  Administered 2022-11-18: 10 mg/h via RESPIRATORY_TRACT
  Filled 2022-11-18: qty 3
  Filled 2022-11-18: qty 12

## 2022-11-18 MED ORDER — IPRATROPIUM-ALBUTEROL 0.5-2.5 (3) MG/3ML IN SOLN
3.0000 mL | Freq: Once | RESPIRATORY_TRACT | Status: AC
  Administered 2022-11-18: 3 mL via RESPIRATORY_TRACT
  Filled 2022-11-18: qty 3

## 2022-11-18 MED ORDER — METHYLPREDNISOLONE SODIUM SUCC 125 MG IJ SOLR
125.0000 mg | Freq: Once | INTRAMUSCULAR | Status: AC
  Administered 2022-11-18: 125 mg via INTRAVENOUS
  Filled 2022-11-18: qty 2

## 2022-11-18 MED ORDER — VENTOLIN HFA 108 (90 BASE) MCG/ACT IN AERS
1.0000 | INHALATION_SPRAY | Freq: Four times a day (QID) | RESPIRATORY_TRACT | 0 refills | Status: DC | PRN
  Filled 2022-11-18: qty 18, 25d supply, fill #0

## 2022-11-18 MED ORDER — TERBUTALINE SULFATE 1 MG/ML IJ SOLN
0.2500 mg | Freq: Once | INTRAMUSCULAR | Status: AC
  Administered 2022-11-18: 0.25 mg via SUBCUTANEOUS
  Filled 2022-11-18: qty 0.25

## 2022-11-18 MED ORDER — SODIUM CHLORIDE 0.9 % IV SOLN
100.0000 mg | Freq: Once | INTRAVENOUS | Status: AC
  Administered 2022-11-18: 100 mg via INTRAVENOUS
  Filled 2022-11-18: qty 100

## 2022-11-18 MED ORDER — PREDNISONE 10 MG PO TABS
50.0000 mg | ORAL_TABLET | Freq: Every day | ORAL | 0 refills | Status: DC
  Filled 2022-11-18: qty 25, 5d supply, fill #0

## 2022-11-18 NOTE — ED Notes (Signed)
Pt call light addressed. Pt complains of "trouble breathing". Educated pt that Dr has not been in there to assess him yet. Notified RN of pts concerns. O2 sat 99 room air.

## 2022-11-18 NOTE — ED Triage Notes (Signed)
Patient presents to ED via [facility] c/o sob onset 4am, states he was having some sob yest. After cutting the grass, states he wasn't able to finish. Upon ems arrival lungs clear however patient was taking short breaths.

## 2022-11-18 NOTE — Discharge Instructions (Signed)
We saw you in the ER for your breathing related complains. We gave you some breathing treatments in the ER, and seems like your symptoms have improved. Please take albuterol as needed every 4 hours. Please take the medications prescribed. We have sent a message to your pulmonary team to ensure that you get the right prescription sent for your maintenance inhalers. Please refrain from smoking or smoke exposure. Please see a primary care doctor in 1 week. Return to the ER if your symptoms worsen.

## 2022-11-18 NOTE — ED Provider Notes (Addendum)
[place] EMERGENCY DEPARTMENT AT [facility] Provider Note   CSN: [id number] Arrival date & time: 11/18/22  4098     History  Chief Complaint  Patient presents with   Shortness of Breath    Kevin [name] is a 67 y.o. male.  HPI    66 year old male comes in with chief complaint of shortness of breath.  Patient has history of COPD, paroxysmal A-fib not on any anticoagulation, hypertension, diastolic CHF.  He also has HIV.  Patient states that he started getting short of breath yesterday.  Overnight however his shortness of breath worsened and he decided to come to the ER.  Patient's shortness of breath is worse with exertion.  He has slight tightness to his chest, but no chest pain.  He is not wheezing like he normally does.  Patient admits to tobacco use.  He denies any new orthopnea or PND.  No new leg swelling.  Home Medications Prior to Admission medications   Medication Sig Start Date End Date Taking? Authorizing Provider  albuterol (VENTOLIN HFA) 108 (90 Base) MCG/ACT inhaler Inhale 1-2 puffs into the lungs every 6 (six) hours as needed for wheezing or shortness of breath. 11/18/22  Yes Derwood Kaplan, MD  doxycycline (VIBRAMYCIN) 100 MG capsule Take 1 capsule (100 mg total) by mouth 2 (two) times daily. 11/18/22  Yes Juniper Cobey, MD  predniSONE (DELTASONE) 10 MG tablet Take 5 tablets (50 mg total) by mouth daily. 11/18/22  Yes Derwood Kaplan, MD  acetylcysteine (MUCOMYST) 10 % nebulizer solution Take 4 mLs by nebulization every 4 (four) hours. 09/19/22   [name], MD  amLODipine-valsartan (EXFORGE) 10-320 MG tablet Take 1 tablet by mouth daily. 11/17/22   Corrin Parker, PA-C  aspirin EC 81 MG tablet Take 1 tablet (81 mg total) by mouth daily, swallow whole Patient not taking: Reported on 11/15/2022 04/28/22   [name], MD  atorvastatin (LIPITOR) 80 MG tablet Take 1 tablet (80 mg total) by mouth daily. Patient not taking: Reported on 11/15/2022  10/25/22     benzonatate (TESSALON) 200 MG capsule Take 1 capsule (200 mg total) by mouth 3 (three) times daily as needed for cough. Patient not taking: Reported on 11/15/2022 07/26/22     carvedilol (COREG) 6.25 MG tablet Take 1 tablet (6.25 mg total) by mouth 2 (two) times daily with a meal. Patient not taking: Reported on 11/15/2022 08/08/22   [name], MD  Dextromethorphan-guaiFENesin [facility] DM) 30-600 MG TB12 Take 1 tablet by mouth 2 (two) times daily. 08/08/22   [name], MD  diclofenac Sodium (VOLTAREN) 1 % GEL Apply 1 Application topically 2 (two) times daily to the affected area as directed. Patient not taking: Reported on 11/15/2022 02/27/22     dolutegravir-lamiVUDine (DOVATO) 50-300 MG tablet Take 1 tablet by mouth daily. 07/06/22   Randall Hiss, MD  DULERA 100-5 MCG/ACT AERO SMARTSIG:2 Puff(s) Via Inhaler Morning-Night 10/24/22   [provider]  fluticasone-salmeterol (ADVAIR HFA) 230-21 MCG/ACT inhaler Inhale 2 puffs into the lungs 2 (two) times daily. Patient not taking: Reported on 11/15/2022 07/14/22   Melene Plan, DO  fluticasone-salmeterol [facility] INHUB) 500-50 MCG/ACT AEPB Inhale 1 puff into the lungs in the morning and at bedtime. Patient not taking: Reported on 11/15/2022 07/25/22   Tilden Fossa, MD  Fluticasone-Umeclidin-Vilant (TRELEGY ELLIPTA) 200-62.5-25 MCG/ACT AEPB Inhale 1 puff into the lungs daily. Patient not taking: Reported on 11/15/2022 09/08/22     hydrALAZINE (APRESOLINE) 100 MG  tablet Take 1 tablet (100 mg total) by mouth 3 (three) times daily. 10/13/22   Rai, Delene Ruffini, MD  hydrOXYzine (ATARAX) 25 MG tablet Take 25 mg by mouth as needed for anxiety. 08/21/22   [provider]  ipratropium-albuterol (DUONEB) 0.5-2.5 (3) MG/3ML SOLN Take 3 mLs by nebulization every 6 (six) hours as needed (wheezing or shortness of breath). Patient not taking: Reported on 11/15/2022 08/17/22   Gwyneth Sprout, MD  isosorbide mononitrate (IMDUR) 60  MG 24 hr tablet Take 1 tablet (60 mg total) by mouth daily. Patient not taking: Reported on 11/15/2022 12/30/21   Rolly Salter, MD  montelukast (SINGULAIR) 10 MG tablet Take 1 tablet (10 mg total) by mouth at bedtime. 09/08/22     naloxone (NARCAN) nasal spray 4 mg/0.1 mL Call 911. Spray contents of 1 spray into one nostril. Repeat in 2-3 min if symptoms of opioid emergency persists; alternate nostrils Patient not taking: Reported on 11/15/2022 10/20/22     nebivolol (BYSTOLIC) 5 MG tablet Take 1 tablet (5 mg total) by mouth daily. Patient not taking: Reported on 11/15/2022 10/24/22     nicotine (NICODERM CQ - DOSED IN MG/24 HOURS) 14 mg/24hr patch Place 1 patch (14 mg total) onto the skin daily. Patient not taking: Reported on 11/15/2022 07/26/22     oxyCODONE-acetaminophen (PERCOCET) 7.5-325 MG tablet Take 1 tablet by mouth 3 (three) times daily as needed for moderate pain. 06/22/22   [provider]      Allergies    Patient has no known allergies.    Review of Systems   Review of Systems  All other systems reviewed and are negative.   Physical Exam Updated Vital Signs BP (!) 176/86   Pulse (!) 58   Temp 98.1 F (36.7 C)   Resp 10   Ht 6' (1.829 m)   Wt 72.6 kg   SpO2 97%   BMI 21.70 kg/m  Physical Exam Vitals and nursing note reviewed.  Constitutional:      Appearance: He is well-developed.  HENT:     Head: Atraumatic.  Neck:     Vascular: No JVD.  Cardiovascular:     Rate and Rhythm: Normal rate.  Pulmonary:     Effort: Pulmonary effort is normal.     Breath sounds: Examination of the right-lower field reveals wheezing. Decreased breath sounds and wheezing present. No rales.     Comments: Patient has fine wheezing, end expiratory, mostly right side Musculoskeletal:     Cervical back: Neck supple.     Right lower leg: No tenderness. No edema.     Left lower leg: No tenderness. No edema.  Skin:    General: Skin is warm.  Neurological:     Mental Status: He is  alert and oriented to person, place, and time.     ED Results / Procedures / Treatments   Labs (all labs ordered are listed, but only abnormal results are displayed) Labs Reviewed  BASIC METABOLIC PANEL - Abnormal; Notable for the following components:      Result Value   Chloride 113 (*)    CO2 20 (*)    Creatinine, Ser 1.81 (*)    Calcium 8.7 (*)    GFR, Estimated 41 (*)    All other components within normal limits  CBC WITH DIFFERENTIAL/PLATELET - Abnormal; Notable for the following components:   RBC 3.37 (*)    Hemoglobin 11.0 (*)    HCT 32.4 (*)    Eosinophils Absolute 0.6 (*)  All other components within normal limits  I-STAT VENOUS BLOOD GAS, ED - Abnormal; Notable for the following components:   pH, Ven 7.446 (*)    pCO2, Ven 28.5 (*)    pO2, Ven 98 (*)    Bicarbonate 19.6 (*)    TCO2 20 (*)    Acid-base deficit 3.0 (*)    HCT 33.0 (*)    Hemoglobin 11.2 (*)    All other components within normal limits  SARS CORONAVIRUS 2 BY RT PCR  BRAIN NATRIURETIC PEPTIDE    EKG None  Radiology DG Chest Port 1 View  Result Date: 11/18/2022 CLINICAL DATA:  67 year old male with history of shortness of breath. EXAM: PORTABLE CHEST 1 VIEW COMPARISON:  Chest x-ray 10/12/2022. FINDINGS: Lung volumes are normal. No consolidative airspace disease. No pleural effusions. No pneumothorax. No pulmonary nodule or mass noted. Pulmonary vasculature and the cardiomediastinal silhouette are within normal limits. IMPRESSION: No radiographic evidence of acute cardiopulmonary disease. Electronically Signed   By: Trudie Reed M.D.   On: 11/18/2022 09:15    Procedures .Critical Care  Performed by: Derwood Kaplan, MD Authorized by: Derwood Kaplan, MD   Critical care provider statement:    Critical care time (minutes):  47   Critical care was necessary to treat or prevent imminent or life-threatening deterioration of the following conditions:  Respiratory failure   Critical care was  time spent personally by me on the following activities:  Development of treatment plan with patient or surrogate, discussions with consultants, evaluation of patient's response to treatment, examination of patient, ordering and review of laboratory studies, ordering and review of radiographic studies, ordering and performing treatments and interventions, pulse oximetry, re-evaluation of patient's condition and review of old charts     Medications Ordered in ED Medications  ipratropium-albuterol (DUONEB) 0.5-2.5 (3) MG/3ML nebulizer solution 3 mL (3 mLs Nebulization Given 11/18/22 0835)  albuterol (PROVENTIL) (2.5 MG/3ML) 0.083% nebulizer solution (10 mg/hr Nebulization Given 11/18/22 1258)  terbutaline (BRETHINE) injection 0.25 mg (0.25 mg Subcutaneous Given 11/18/22 1324)  methylPREDNISolone sodium succinate (SOLU-MEDROL) 125 mg/2 mL injection 125 mg (125 mg Intravenous Given 11/18/22 1305)  doxycycline (VIBRAMYCIN) 100 mg in sodium chloride 0.9 % 250 mL IVPB (100 mg Intravenous New Bag/Given 11/18/22 1306)    ED Course/ Medical Decision Making/ A&P                                 Medical Decision Making Amount and/or Complexity of Data Reviewed Labs: ordered. Radiology: ordered.  Risk Prescription drug management.   This patient presents to the ED with chief complaint(s) of shortness of breath with pertinent past medical history of COPD, A-fib, HIV, CHF.The complaint involves an extensive differential diagnosis and also carries with it a high risk of complications and morbidity.    The differential diagnosis includes : Acute COPD exacerbation, pneumonia, pulmonary embolism, acute coronary syndrome, pulmonary edema, CHF, pleural effusion  The initial plan is to get basic labs. Patient has minimal wheezing.  Will give him DuoNeb and reassess.   Additional history obtained: Records reviewed previous admission documents and Primary Care Documents  Independent labs interpretation:   The following labs were independently interpreted: Patient's workup is reassuring.  There is no hypercapnia.  The BNP is normal.  White count is normal.  Independent visualization and interpretation of imaging: - I independently visualized the following imaging with scope of interpretation limited to determining acute life threatening conditions related  to emergency care: X-ray of the chest, which revealed no evidence of pulmonary edema or focal consolidation.  Treatment and Reassessment: Patient reassessed after DuoNeb.  He does not feel significantly better.  However I discussed with him that his lab workup is reassuring.  There is no clear evidence of CHF exacerbation.  My plan is to give him albuterol hour-long along with terbutaline and Solu-Medrol and will give him IV doxycycline.  Will reassess him thereafter.  Reassessment at 2 PM: Patient is almost done with hour-long breathing treatment.  He feels better.  He is requesting that I reassess him once he is completely done with treatment.  Reassessment at 2:40 PM: Patient indicates that he feels a lot better right now. He is receiving IV medications right now patient notably not as tachypneic as he was.  No wheezing appreciated.  Reassessment at 3:20 PM: Patient is now comfortable going home. I looked at patient's note, it appears that the pharmacy team is trying to figure out his regimen so that he can get appropriate inhalers.  It is unclear to me if he is on Advair or Dulera.  He wants albuterol inhaler, which has been prescribed.  The patient appears reasonably screened and/or stabilized for discharge and I doubt any other medical condition or other [facility] requiring further screening, evaluation, or treatment in the ED at this time prior to discharge.   Results from the ER workup discussed with the patient face to face and all questions answered to the best of my ability. The patient is safe for discharge with strict return  precautions.   Final Clinical Impression(s) / ED Diagnoses Final diagnoses:  COPD exacerbation (HCC)    Rx / DC Orders ED Discharge Orders          Ordered    doxycycline (VIBRAMYCIN) 100 MG capsule  2 times daily        11/18/22 1522    predniSONE (DELTASONE) 10 MG tablet  Daily        11/18/22 1522    albuterol (VENTOLIN HFA) 108 (90 Base) MCG/ACT inhaler  Every 6 hours PRN        11/18/22 1522              Derwood Kaplan, MD 11/18/22 1527    Derwood Kaplan, MD 11/18/22 1528

## 2022-11-20 ENCOUNTER — Other Ambulatory Visit (HOSPITAL_COMMUNITY): Payer: Self-pay

## 2022-11-20 ENCOUNTER — Other Ambulatory Visit: Payer: Self-pay

## 2022-11-20 ENCOUNTER — Telehealth: Payer: Self-pay

## 2022-11-20 NOTE — Telephone Encounter (Signed)
Rx was filled on 11/18/2022 by Derwood Kaplan, MD

## 2022-11-20 NOTE — Transitions of Care (Post Inpatient/ED Visit) (Signed)
11/20/2022  Name: LARKIN KOTILA MRN: [id number] DOB: 1955-03-30  Today's TOC FU Call Status: Today's TOC FU Call Status:: Unsuccessful Call (1st Attempt) Unsuccessful Call (1st Attempt) Date: 11/20/22  Attempted to reach the patient regarding the most recent Inpatient/ED visit.  Follow Up Plan: Additional outreach attempts will be made to reach the patient to complete the Transitions of Care (Post Inpatient/ED visit) call.   Signature Arvil Persons, BSN, Charity fundraiser

## 2022-11-21 ENCOUNTER — Other Ambulatory Visit: Payer: Self-pay

## 2022-11-22 ENCOUNTER — Ambulatory Visit: Payer: Medicaid Other | Admitting: Dermatology

## 2022-11-22 NOTE — Progress Notes (Signed)
11/22/2022  Patient ID: Kevin [name], male   DOB: Mar 07, 1955, 67 y.o.   MRN: [id number]  Continuing to work with providers to clarify patient's current medication regimen.  Cardiology responded stating patient should be taking nebivolol 5mg  daily, hydralazine 100mg  TID, atorvastatin 80mg  daily, amlodipine/valsartan 10/320mg  daily, and ASA 81mg  daily.  Patient is currently taking carvedilol 6.25mg  BID and atorvastatin 40mg .  Still awaiting response from pulmonology to clarify long-acting inhaler, because patient states he is using Dulera as needed in addition to acetylcysteine nebulizer solution as needed.  He was recently seen in the ED, and an albuterol rescue inhaler was prescribed and filled by [facility].  Patient sees pulmonology Friday, so I will review note from that visit if I have not heard back from Dr. Delton Coombes by then.  Once COPD regimen is confirmed, I will coordinate with [facility] and patient to confirm correct medications are filled, delivered, and replace any incorrect medications patient may currently have on hand.  Lenna Gilford, PharmD, DPLA

## 2022-11-24 ENCOUNTER — Ambulatory Visit: Payer: Medicaid Other | Admitting: Emergency Medicine

## 2022-11-24 DIAGNOSIS — J441 Chronic obstructive pulmonary disease with (acute) exacerbation: Secondary | ICD-10-CM

## 2022-11-24 DIAGNOSIS — F17200 Nicotine dependence, unspecified, uncomplicated: Secondary | ICD-10-CM

## 2022-11-24 LAB — PULMONARY FUNCTION TEST
DL/VA % pred: 84 %
DL/VA: 3.48 ml/min/mmHg/L
DLCO cor % pred: 74 %
DLCO cor: 21.46 ml/min/mmHg
DLCO unc % pred: 66 %
DLCO unc: 19.08 ml/min/mmHg
FEF 25-75 Post: 0.5 L/s
FEF 25-75 Pre: 0.89 L/s
FEF2575-%Change-Post: -43 %
FEF2575-%Pred-Post: 16 %
FEF2575-%Pred-Pre: 30 %
FEV1-%Change-Post: 6 %
FEV1-%Pred-Post: 45 %
FEV1-%Pred-Pre: 42 %
FEV1-Post: 1.7 L
FEV1-Pre: 1.59 L
FEV1FVC-%Change-Post: 15 %
FEV1FVC-%Pred-Pre: 60 %
FEV6-%Change-Post: -6 %
FEV6-%Pred-Post: 67 %
FEV6-%Pred-Pre: 72 %
FEV6-Post: 3.19 L
FEV6-Pre: 3.43 L
FEV6FVC-%Change-Post: 1 %
FEV6FVC-%Pred-Post: 103 %
FEV6FVC-%Pred-Pre: 102 %
FVC-%Change-Post: -7 %
FVC-%Pred-Post: 64 %
FVC-%Pred-Pre: 70 %
FVC-Post: 3.24 L
FVC-Pre: 3.52 L
Post FEV1/FVC ratio: 53 %
Post FEV6/FVC ratio: 99 %
Pre FEV1/FVC ratio: 45 %
Pre FEV6/FVC Ratio: 98 %
RV % pred: 183 %
RV: 4.47 L
TLC % pred: 113 %
TLC: 8.39 L

## 2022-11-24 NOTE — Progress Notes (Signed)
Full PFT performed today. °

## 2022-11-24 NOTE — Patient Instructions (Signed)
Full PFT performed today. °

## 2022-11-29 ENCOUNTER — Encounter: Payer: Self-pay | Admitting: Emergency Medicine

## 2022-11-29 ENCOUNTER — Other Ambulatory Visit: Payer: Self-pay

## 2022-11-29 ENCOUNTER — Ambulatory Visit (INDEPENDENT_AMBULATORY_CARE_PROVIDER_SITE_OTHER): Payer: Medicaid Other | Admitting: Emergency Medicine

## 2022-11-29 ENCOUNTER — Other Ambulatory Visit (HOSPITAL_COMMUNITY): Payer: Self-pay

## 2022-11-29 VITALS — BP 161/90 | HR 54 | Temp 98.3°F | Ht 72.0 in | Wt 162.8 lb

## 2022-11-29 DIAGNOSIS — J439 Emphysema, unspecified: Secondary | ICD-10-CM | POA: Diagnosis not present

## 2022-11-29 DIAGNOSIS — R918 Other nonspecific abnormal finding of lung field: Secondary | ICD-10-CM | POA: Diagnosis not present

## 2022-11-29 DIAGNOSIS — J4489 Other specified chronic obstructive pulmonary disease: Secondary | ICD-10-CM | POA: Diagnosis not present

## 2022-11-29 DIAGNOSIS — Z720 Tobacco use: Secondary | ICD-10-CM

## 2022-11-29 NOTE — Progress Notes (Unsigned)
Advanced [facility]pertension [facility] Assessment:    Date:  11/30/2022   ID:  Kevin [name], DOB [birth year], MRN [id number]  PCP:  [name], MD  Cardiologist:  Chrystie Nose, MD  Nephrologist:  Referring MD: [name], MD   CC: Hypertension  History of Present Illness:    Kevin [name] is a 67 y.o. male with a hx of HTN, HLD, COPD, OSA on CPAP, HIV, PAD s/p left SFA intervention 07/2016, CKDIIIb, CAD. Here to follow up in the [facility].   Follows with Dr. Rennis Golden for general cardiology. Previous cardiac workup includes: Echo 12/2021 LVEF 60-65%, moderate LVH, gr1DD, mildly elevated PASP, no significant valvular abnormalities. Carotid duplex 08/2022 mild nonobstructive 1-39% stenosis bilaterally. Myoview 09/26/22 no ischemia, LVEF 43%.   Established with [facility] 10/05/22. Kevin [name] was diagnosed with hypertension more than 10 years ago. It has been difficult to control particularly with compliance. Smoking 1-2 cigarettes per day. No formal exercise routine, not routinely following low sodium diet.   At initial visit 10/05/22 Amlodipine and valsartan were consolidated to Amlodipine-Valsartan 10-320mg  daily. Hdyralazine adjusted to 100mg  BID dosing for better adherence. BP noted to be nearly 50 points higher in L arm - had CT neck upcoming per neurology. Prior carotid duplex 08/2022 bilateral 1-39% stenosis. Was encouraged to resume using noninvasive ventilator at bedtime. Medications sent to [facility] for adherence packaging and free mail delivery.   Admitted 8/29-8/30/24 with COPD exacerbation treated with Prednisone and abx. Hydralazine was increased to 100mg  TID.   Saw general cardiology team and Lopressor was transitioned to Nebivolol 5mg  daily.  CT angio head/neck 10/25/22 mild soft plaque in thoracic aorta, no appreciable hemodynamically significant prox subclavian artery stenosis, mild plaque bilateral ICA but no significant stenosis,  patent vertebral arteries.   Presents today for follow up. BP at home 160s. Has not taken meds today, takes first dose at noon. Wearing noninvasive ventilator at night. Given severe OSA, doubt candidate for Inspire. Reports got last pill pack a month ago however on review pharmacy has been trying to reach him without return call. Provided him their number to call. Not following heart healthy diet eating things such as: burgers, bologna, breakfast food, PB and J. Not exercising.   Previous antihypertensives: Coreg - transitioned to cardioselective beta blocker  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    Past Surgical History:  Procedure Laterality Date   IR RADIOLOGIST EVAL & MGMT  05/31/2021   IR RADIOLOGIST EVAL & MGMT  07/26/2021   LEFT HEART CATH AND CORONARY ANGIOGRAPHY N/A 10/23/2016   Procedure: LEFT HEART CATH AND CORONARY ANGIOGRAPHY;  Surgeon: Marykay Lex, MD;  Location: [facility] INVASIVE CV LAB;  Service: Cardiovascular;  Laterality: N/A;   LOWER EXTREMITY ANGIOGRAPHY N/A 07/17/2016   Procedure: Lower Extremity Angiography;  Surgeon: Runell Gess, MD;  Location: [facility] INVASIVE CV LAB;  Service: Cardiovascular;  Laterality: N/A;   LOWER EXTREMITY INTERVENTION N/A 06/05/2016   Procedure: Lower Extremity Intervention;  Surgeon: Runell Gess, MD;  Location: [facility] INVASIVE CV LAB;   Service: Cardiovascular;  Laterality: N/A;   PERIPHERAL VASCULAR ATHERECTOMY Right 07/17/2016   Procedure: Peripheral Vascular Atherectomy;  Surgeon: Runell Gess, MD;  Location: [facility] INVASIVE CV LAB;  Service: Cardiovascular;  Laterality: Right;  SFA   PERIPHERAL VASCULAR INTERVENTION  06/05/2016   Procedure: Peripheral Vascular Intervention;  Surgeon: Runell Gess, MD;  Location: [facility] INVASIVE CV LAB;  Service: Cardiovascular;;  left SFA   RADIOLOGY WITH ANESTHESIA Right 06/29/2021   Procedure: RIGHT RENAL CRYOABLATION;  Surgeon: Richarda Overlie, MD;  Location: [facility] ORS;  Service: Anesthesiology;  Laterality: Right;    Current Medications: Current Meds  Medication Sig   acetylcysteine (MUCOMYST) 10 % nebulizer solution Take 4 mLs by nebulization every 4 (four) hours.   albuterol (VENTOLIN HFA) 108 (90 Base) MCG/ACT inhaler Inhale 1-2 puffs into the lungs every 6 (six) hours as needed for wheezing or shortness of breath.   amLODipine-valsartan (EXFORGE) 10-320 MG tablet Take 1 tablet by mouth daily.   aspirin EC 81 MG tablet Take 1 tablet (81 mg total) by mouth daily, swallow whole   atorvastatin (LIPITOR) 80 MG tablet Take 1 tablet (80 mg total) by mouth daily.   dolutegravir-lamiVUDine (DOVATO) 50-300 MG tablet Take 1 tablet by mouth daily.   DULERA 100-5 MCG/ACT AERO    fluticasone-salmeterol (ADVAIR HFA) 230-21 MCG/ACT inhaler Inhale 2 puffs into the lungs 2 (two) times daily.   fluticasone-salmeterol (WIXELA INHUB) 500-50 MCG/ACT AEPB Inhale 1 puff into the lungs in the morning and at bedtime.   Fluticasone-Umeclidin-Vilant (TRELEGY ELLIPTA) 200-62.5-25 MCG/ACT AEPB Inhale 1 puff into the lungs daily.   hydrALAZINE (APRESOLINE) 100 MG tablet Take 1 tablet (100 mg total) by mouth 3 (three) times daily.   hydrOXYzine (ATARAX) 25 MG tablet Take 25 mg by mouth as needed for anxiety.   ipratropium-albuterol (DUONEB) 0.5-2.5 (3) MG/3ML SOLN Take 3 mLs by nebulization every 6 (six) hours as needed  (wheezing or shortness of breath).   montelukast (SINGULAIR) 10 MG tablet Take 1 tablet (10 mg total) by mouth at bedtime.   naloxone (NARCAN) nasal spray 4 mg/0.1 mL Call 911. Spray contents of 1 spray into one nostril. Repeat in 2-3 min if symptoms of opioid emergency persists; alternate nostrils   nebivolol (BYSTOLIC) 5 MG tablet Take 1 tablet (5 mg total) by mouth daily.     Allergies:   Patient has no known allergies.   Social History   Socioeconomic History   Marital status: Significant Other    Spouse name: Not on file   Number of children: Not on file   Years of education: Not on file   Highest education level: Not on file  Occupational History   Not on file  Tobacco Use   Smoking status: Every Day    Current packs/day: 0.10    Average packs/day: 0.1 packs/day for 42.0 years (4.2 ttl pk-yrs)    Types: Cigarettes, E-cigarettes    Passive exposure: Never   Smokeless tobacco: Never   Tobacco comments:    1-2 cigarettes a day 09/29/22 Tay  Vaping Use   Vaping status: Every Day   Substances: Nicotine, Flavoring  Substance and Sexual Activity   Alcohol use: Not Currently    Alcohol/week: 2.0 standard drinks of alcohol  Types: 2 Cans of beer per week    Comment: rare   Drug use: Not Currently    Comment: rare   Sexual activity: Not Currently    Comment: declined condoms  Other Topics Concern   Not on file  Social History Narrative   Right handed   Social Determinants of Health   Financial Resource Strain: High Risk (10/05/2022)   Overall Financial Resource Strain (CARDIA)    Difficulty of Paying Living Expenses: Hard  Food Insecurity: No Food Insecurity (10/13/2022)   Hunger Vital Sign    Worried About Running Out of Food in the Last Year: Never true    Ran Out of Food in the Last Year: Never true  Transportation Needs: No Transportation Needs (10/13/2022)   PRAPARE - Administrator, Civil Service (Medical): No    Lack of Transportation (Non-Medical):  No  Physical Activity: Inactive (10/05/2022)   Exercise Vital Sign    Days of Exercise per Week: 0 days    Minutes of Exercise per Session: 0 min  Stress: Stress Concern Present (05/04/2022)   [employer] of Occupational Health - Occupational Stress Questionnaire    Feeling of Stress : To some extent  Social Connections: Not on file     Family History: The patient's family history includes Heart attack in his daughter; High blood pressure in his mother; Lupus in his mother; Seizures in his daughter.  ROS:   Please see the history of present illness.    All other systems reviewed and are negative.  EKGs/Labs/Other Studies Reviewed:         Recent Labs: 04/28/2022: Magnesium 2.0; TSH 1.28 10/24/2022: ALT 8 11/18/2022: B Natriuretic Peptide 97.5; BUN 14; Creatinine, Ser 1.81; Hemoglobin 11.2; Platelets 314; Potassium 4.2; Sodium 142   Recent Lipid Panel    Component Value Date/Time   CHOL 190 10/24/2022 1432   TRIG 79 10/24/2022 1432   HDL 79 10/24/2022 1432   CHOLHDL 2.4 10/24/2022 1432   CHOLHDL 3.5 07/06/2022 1145   VLDL 41 (H) 04/04/2019 0458   LDLCALC 97 10/24/2022 1432   LDLCALC 110 (H) 07/06/2022 1145    Physical Exam:   VS:  BP (!) 170/99   Pulse (!) 55   Ht 6' (1.829 m)   Wt 163 lb 14.4 oz (74.3 kg)   SpO2 97%   BMI 22.23 kg/m  , BMI Body mass index is 22.23 kg/m. GENERAL:  Well appearing HEENT: Pupils equal round and reactive, fundi not visualized, oral mucosa unremarkable NECK:  No jugular venous distention, waveform within normal limits, carotid upstroke brisk and symmetric, no bruits, no thyromegaly LYMPHATICS:  No cervical adenopathy LUNGS:  Clear to auscultation bilaterally HEART:  RRR.  PMI not displaced or sustained,S1 and S2 within normal limits, no S3, no S4, no clicks, no rubs, no murmurs ABD:  Flat, positive bowel sounds normal in frequency in pitch, no bruits, no rebound, no guarding, no midline pulsatile mass, no hepatomegaly, no  splenomegaly EXT:  2 plus pulses throughout, no edema, no cyanosis no clubbing SKIN:  No rashes no nodules NEURO:  Cranial nerves II through XII grossly intact, motor grossly intact throughout PSYCH:  Cognitively intact, oriented to person place and time   ASSESSMENT/PLAN:    HTN / Medication management - Poor compliance contributory to uncontrolled hypertension. Antihypertensive regimen limited by renal function. Would avoid diuretic or MRA without nephrology input. Medications sent to [facility] for adherence packaging as these will be delivered to his  home. Was able to clarify med list for them and will be sent out today with exception of Hydralazine 100mg  TID which is too soon for refill (he has Rx at home and will take it with adherence packaging). Unclear which antihypertensive agents he has been taking at home, as such will not make changes. Continue present regimen: Hydralazine 100mg  TID, Amlodipine-Valsartan 10-320mg  daily, Nebivolol 5mg  daily. If BP uncontrolled add Imdur in future.  Will monitor how BP control improves with adherence, consider renal duplex if no improvement to rule out new RAS. Prior renal duplex 04/2016 with no stenosis.   OSA - Encouraged to continue using noninvasive ventilator. Discussed role in hypertension management.   COPD - Follows with PCP and pulmonology. No present exacerbation.  CKD - PCP has referred to nephrology. Careful titration of diuretic and antihypertensive.    CAD / HLD - Stable with no anginal symptoms. Per general cardiology team.  Tobacco use - Smoking cessation encouraged. Recommend utilization of [id number].   PAD - No new claudication. Follows with [facility].   Screening for Secondary Hypertension:     10/10/2022    9:11 PM  Causes  Renovascular HTN Screened     - Comments 04/2016 renal duplex with no stenosis  Sleep Apnea Screened     - Comments 09/2022 recommended to resume use of nonivasive ventilator  Thyroid Disease  Screened     - Comments 04/2022 normal TSH  Cushing's Syndrome N/A     - Comments non cushingoid appearance    Relevant Labs/Studies:    Latest Ref Rng & Units 11/18/2022    8:39 AM 11/18/2022    8:24 AM 10/12/2022   10:55 PM  Basic Labs  Sodium 135 - 145 mmol/L 142  141  140   Potassium 3.5 - 5.1 mmol/L 4.2  4.2  4.0   Creatinine 0.61 - 1.24 mg/dL  4.09  8.11        Latest Ref Rng & Units 04/28/2022    3:32 PM 07/14/2021    8:41 AM  Thyroid   TSH 0.40 - 4.50 mIU/L 1.28  0.511                   Disposition:    FU with MD/PharmD/APP in 2 months.   Medication Adjustments/Labs and Tests Ordered: Current medicines are reviewed at length with the patient today.  Concerns regarding medicines are outlined above.  No orders of the defined types were placed in this encounter.  No orders of the defined types were placed in this encounter.    Signed, Alver Sorrow, NP  11/30/2022 9:05 AM    [facility]

## 2022-11-29 NOTE — Assessment & Plan Note (Signed)
Please continue Trelegy 1 inhalation once daily.  Rinse and gargle after using. Keep albuterol available to use 2 puffs up to every 4 hours if needed for shortness of breath, chest tightness, wheezing.  We reviewed your pulmonary function testing today.  This shows severe obstructive lung disease and abnormal airflow.  You are at increased risk for sedation for your colonoscopy.  That said as long as you are not having flaring symptoms and as long as you are on your maintenance medications there are no contraindications to proceeding as long as the benefits outweigh these risks.  We will document this in your office notes for [facility] gastroenterology

## 2022-11-29 NOTE — Assessment & Plan Note (Signed)
You need to work on stopping smoking

## 2022-11-29 NOTE — Progress Notes (Signed)
Subjective:    Patient ID: Kevin [name], male    DOB: October 26, 1955, 67 y.o.   MRN: [id number]  HPI 67 year old patient with a history of tobacco use (40+ pk-yrs) and COPD, renal cell cancer, HIV on therapy, hypertension and atrial fibrillation with diastolic CHF, OSA on CPAP, gout.  He has been seen by Dr. Thora Lance in our office for his COPD and also pulmonary nodular disease on a CT chest 11/2020.  Repeat CT scan of the chest was done as below as well as subsequent PET scan.  CT scan of the chest 09/06/2022 reviewed by me shows multiple stable right sided solid and subsolid pulmonary nodules that were stable or regressed.  2 nodules had increased in size including a 7 mm solid right upper lobe nodule, 15 mm mixed density right lower lobe nodule.  This prompted PET scan. He has been experiencing episodes of acute dyspnea, has been back and forth to the ED multiple times this year. Was started on Trelegy in June. Can happen w exertion, chores or at rest. Does not wake him from sleep. He coughs through the day, produces mucous in the am. ? Whether there may be a component of panic / anxiety. Albuterol nebs 3x a day. Unclear triggers. Does have trouble in hot air.   PET scan 09/21/2022 reviewed by me, shows mild hypermetabolism within the mediastinal nodes with SUV max 4.1 of unclear significance.  The right upper lobe peribronchovascular nodules measured at 5 mm.  None of the groundglass nodules had any hypermetabolism.  There was some nonspecific hypermetabolism in the right apex as well as inferior lingula and an area of some minimal airspace disease that was new compared with the CT done 7/24  [name] 11/29/2022 --67 year old smoker with COPD, HIV, renal cell cancer, hypertension, A-fib with diastolic dysfunction, OSA on CPAP.  He is preparing to undergo colonoscopy with [facility] gastroenterology and needs preoperative surgical evaluation.  I saw him in August for an abnormal CT scan of the chest with small right  upper lobe and right lower lobe pulmonary nodules.  These look as they may have evolved on CT but they were negative on PET scan.  We plan for repeat imaging in January 2025 he was in the emergency department 11/18/2022 with acute exacerbation of COPD, received an extended nebulizer treatment, steroids and was sent out on prednisone and doxycycline.  His maintenance medication has been Trelegy, albuterol or DuoNeb as needed. He has been off the trelegy for over a month. He is still smoking a few cig a day. He is feeling better.   PFT from 11/24/22 reviewed and shows severe obstruction without a bronchodilator response, FEV1 42% predicted.  Lung volumes are hyperinflated.  Diffusion capacity decreased and corrects to normal range when adjusted for alveolar volume     Review of Systems As per HPI  Past Medical History:  Diagnosis Date   AIDS (acquired immune deficiency syndrome) (HCC) 08/17/2016   Chronic diastolic CHF (congestive heart failure), NYHA class 3 (HCC) 01/2016   Chronic lower back pain    CKD (chronic kidney disease), stage III (HCC)    COPD (chronic obstructive pulmonary disease) (HCC)    Gout    "forearms, hands, ankles, feet" (06/05/2016)   Headache    "weekly" (06/05/2016)   Heart murmur    Hypertension    Hypertensive crisis 08/15/2017   Lipoma 07/06/2022   OSA on CPAP    PAD (peripheral artery disease) (HCC)    PAF (paroxysmal atrial  fibrillation) (HCC) 01/2016   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     Family History  Problem Relation Age of Onset   High blood pressure Mother    Lupus Mother    Seizures Daughter    Heart attack Daughter      Social History   Socioeconomic History   Marital status: Significant Other    Spouse name: Not on file   Number of children: Not on file   Years of education: Not on file   Highest education level: Not on file  Occupational History   Not on file  Tobacco Use   Smoking status: Every Day    Current  packs/day: 0.10    Average packs/day: 0.1 packs/day for 42.0 years (4.2 ttl pk-yrs)    Types: Cigarettes, E-cigarettes    Passive exposure: Never   Smokeless tobacco: Never   Tobacco comments:    1-2 cigarettes a day 09/29/22 Tay  Vaping Use   Vaping status: Every Day   Substances: Nicotine, Flavoring  Substance and Sexual Activity   Alcohol use: Not Currently    Alcohol/week: 2.0 standard drinks of alcohol    Types: 2 Cans of beer per week    Comment: rare   Drug use: Not Currently    Comment: rare   Sexual activity: Not Currently    Comment: declined condoms  Other Topics Concern   Not on file  Social History Narrative   Right handed   Social Determinants of Health   Financial Resource Strain: High Risk (10/05/2022)   Overall Financial Resource Strain (CARDIA)    Difficulty of Paying Living Expenses: Hard  Food Insecurity: No Food Insecurity (10/13/2022)   Hunger Vital Sign    Worried About Running Out of Food in the Last Year: Never true    Ran Out of Food in the Last Year: Never true  Transportation Needs: No Transportation Needs (10/13/2022)   PRAPARE - Administrator, Civil Service (Medical): No    Lack of Transportation (Non-Medical): No  Physical Activity: Inactive (10/05/2022)   Exercise Vital Sign    Days of Exercise per Week: 0 days    Minutes of Exercise per Session: 0 min  Stress: Stress Concern Present (05/04/2022)   [employer] of Occupational Health - Occupational Stress Questionnaire    Feeling of Stress : To some extent  Social Connections: Not on file  Intimate Partner Violence: Not At Risk (10/13/2022)   Humiliation, Afraid, Rape, and Kick questionnaire    Fear of Current or Ex-Partner: No    Emotionally Abused: No    Physically Abused: No    Sexually Abused: No     No Known Allergies   Outpatient Medications Prior to Visit  Medication Sig Dispense Refill   acetylcysteine (MUCOMYST) 10 % nebulizer solution Take 4 mLs by  nebulization every 4 (four) hours. 30 mL 12   albuterol (VENTOLIN HFA) 108 (90 Base) MCG/ACT inhaler Inhale 1-2 puffs into the lungs every 6 (six) hours as needed for wheezing or shortness of breath. 18 g 0   amLODipine-valsartan (EXFORGE) 10-320 MG tablet Take 1 tablet by mouth daily. 30 tablet 5   aspirin EC 81 MG tablet Take 1 tablet (81 mg total) by mouth daily, swallow whole 30 tablet 12   atorvastatin (LIPITOR) 80 MG tablet Take 1 tablet (80 mg total) by mouth daily. 30 tablet 3   benzonatate (TESSALON) 200 MG capsule Take 1 capsule (200 mg total) by mouth 3 (three) times daily  as needed for cough. 60 capsule 1   Dextromethorphan-guaiFENesin (MUCINEX DM) 30-600 MG TB12 Take 1 tablet by mouth 2 (two) times daily. 136 tablet 3   diclofenac Sodium (VOLTAREN) 1 % GEL Apply 1 Application topically 2 (two) times daily to the affected area as directed. 100 g 11   dolutegravir-lamiVUDine (DOVATO) 50-300 MG tablet Take 1 tablet by mouth daily. 30 tablet 11   doxycycline (VIBRAMYCIN) 100 MG capsule Take 1 capsule (100 mg total) by mouth 2 (two) times daily. 14 capsule 0   Fluticasone-Umeclidin-Vilant (TRELEGY ELLIPTA) 200-62.5-25 MCG/ACT AEPB Inhale 1 puff into the lungs daily. 60 each 5   hydrALAZINE (APRESOLINE) 100 MG tablet Take 1 tablet (100 mg total) by mouth 3 (three) times daily. 90 tablet 5   hydrOXYzine (ATARAX) 25 MG tablet Take 25 mg by mouth as needed for anxiety.     ipratropium-albuterol (DUONEB) 0.5-2.5 (3) MG/3ML SOLN Take 3 mLs by nebulization every 6 (six) hours as needed (wheezing or shortness of breath). 120 mL 1   montelukast (SINGULAIR) 10 MG tablet Take 1 tablet (10 mg total) by mouth at bedtime. 30 tablet 5   naloxone (NARCAN) nasal spray 4 mg/0.1 mL Call 911. Spray contents of 1 spray into one nostril. Repeat in 2-3 min if symptoms of opioid emergency persists; alternate nostrils 2 each 0   nebivolol (BYSTOLIC) 5 MG tablet Take 1 tablet (5 mg total) by mouth daily. 30 tablet 6    oxyCODONE-acetaminophen (PERCOCET) 7.5-325 MG tablet Take 1 tablet by mouth 3 (three) times daily as needed for moderate pain.     predniSONE (DELTASONE) 10 MG tablet Take 5 tablets (50 mg total) by mouth daily. 25 tablet 0   DULERA 100-5 MCG/ACT AERO SMARTSIG:2 Puff(s) Via Inhaler Morning-Night (Patient not taking: Reported on 11/29/2022)     fluticasone-salmeterol (ADVAIR HFA) 230-21 MCG/ACT inhaler Inhale 2 puffs into the lungs 2 (two) times daily. (Patient not taking: Reported on 11/15/2022) 12 g 2   fluticasone-salmeterol (WIXELA INHUB) 500-50 MCG/ACT AEPB Inhale 1 puff into the lungs in the morning and at bedtime. (Patient not taking: Reported on 11/15/2022) 180 each 0   No facility-administered medications prior to visit.        Objective:   Physical Exam Vitals:   11/29/22 1533 11/29/22 1538  BP: (!) 168/95 (!) 161/90  Pulse: (!) 54   Temp: 98.3 F (36.8 C)   TempSrc: Oral   SpO2: 98%   Weight: 162 lb 12.8 oz (73.8 kg)   Height: 6' (1.829 m)    Gen: Pleasant, well-nourished, in no distress,  normal affect  ENT: No lesions,  mouth clear,  oropharynx clear, no postnasal drip  Neck: No JVD, no stridor  Lungs: No use of accessory muscles, no crackles or wheezing on normal respiration, no wheeze on forced expiration  Cardiovascular: RRR, heart sounds normal, no murmur or gallops, no peripheral edema  Musculoskeletal: No deformities, no cyanosis or clubbing  Neuro: alert, awake, non focal  Skin: Warm, no lesions or rash      Assessment & Plan:  COPD with chronic bronchitis and emphysema (HCC) Please continue Trelegy 1 inhalation once daily.  Rinse and gargle after using. Keep albuterol available to use 2 puffs up to every 4 hours if needed for shortness of breath, chest tightness, wheezing.  We reviewed your pulmonary function testing today.  This shows severe obstructive lung disease and abnormal airflow.  You are at increased risk for sedation for your colonoscopy.   That said  as long as you are not having flaring symptoms and as long as you are on your maintenance medications there are no contraindications to proceeding as long as the benefits outweigh these risks.  We will document this in your office notes for [facility] gastroenterology  Lung nodule, multiple Get your repeat CT scan of the chest in January 2025 as planned Follow Dr. Delton Coombes in January after your CT so we can review those results together  Tobacco abuse You need to work on stopping smoking    Levy Pupa, MD, PhD 11/29/2022, 5:06 PM [facility] [phone] or if no answer before 7:00PM call [phone] For any issues after 7:00PM please call eLink [phone]

## 2022-11-29 NOTE — Assessment & Plan Note (Signed)
Get your repeat CT scan of the chest in January 2025 as planned Follow Dr. Delton Coombes in January after your CT so we can review those results together

## 2022-11-29 NOTE — Patient Instructions (Signed)
Please continue Trelegy 1 inhalation once daily.  Rinse and gargle after using. Keep albuterol available to use 2 puffs up to every 4 hours if needed for shortness of breath, chest tightness, wheezing.  We reviewed your pulmonary function testing today.  This shows severe obstructive lung disease and abnormal airflow.  You are at increased risk for sedation for your colonoscopy.  That said as long as you are not having flaring symptoms and as long as you are on your maintenance medications there are no contraindications to proceeding as long as the benefits outweigh these risks.  We will document this in your office notes for [facility] gastroenterology. Get your repeat CT scan of the chest in January 2025 as planned You need to work on stopping smoking Follow Dr. Delton Coombes in January after your CT so we can review those results together

## 2022-11-30 ENCOUNTER — Telehealth: Payer: Self-pay | Admitting: Student-PharmD

## 2022-11-30 ENCOUNTER — Ambulatory Visit (INDEPENDENT_AMBULATORY_CARE_PROVIDER_SITE_OTHER): Payer: Medicaid Other | Admitting: Family

## 2022-11-30 ENCOUNTER — Encounter (HOSPITAL_BASED_OUTPATIENT_CLINIC_OR_DEPARTMENT_OTHER): Payer: Self-pay | Admitting: Family

## 2022-11-30 ENCOUNTER — Other Ambulatory Visit (HOSPITAL_COMMUNITY): Payer: Self-pay

## 2022-11-30 ENCOUNTER — Other Ambulatory Visit: Payer: Self-pay

## 2022-11-30 VITALS — BP 180/102 | HR 55 | Ht 72.0 in | Wt 163.9 lb

## 2022-11-30 DIAGNOSIS — I10 Essential (primary) hypertension: Secondary | ICD-10-CM | POA: Diagnosis not present

## 2022-11-30 DIAGNOSIS — G4733 Obstructive sleep apnea (adult) (pediatric): Secondary | ICD-10-CM

## 2022-11-30 DIAGNOSIS — E785 Hyperlipidemia, unspecified: Secondary | ICD-10-CM

## 2022-11-30 DIAGNOSIS — I25118 Atherosclerotic heart disease of native coronary artery with other forms of angina pectoris: Secondary | ICD-10-CM

## 2022-11-30 NOTE — Patient Instructions (Signed)
Medication Instructions:  We have reached out to the [facility] team to ensure we get your pill packs correct and delivered. We will send you a MyChart message or call once we have the updated information.    Follow-Up: follow up in 2 months with [facility]    Special Instructions:  Please bring pill pack/bottles and BP cuff to next appointment.  Please bring log of BP readings to next office visit.   Keep up the good work using your noninvasive ventilator at night!

## 2022-11-30 NOTE — Progress Notes (Signed)
11/30/2022  Patient ID: Kevin [name], male   DOB: 1955-02-26, 67 y.o.   MRN: [id number]  Contacted PharmD assisting with adherence packaging/home delivery through [facility] to inform that current medication list is now up to date per recent cardiology and pulmonology visits.  Pharmacy is now working on refills for the patient.  I will try to contact him again tomorrow if we do not hear back today to review current medication list with him to verify understanding.  Lenna Gilford, PharmD, DPLA

## 2022-11-30 NOTE — Progress Notes (Signed)
Called and left message asking pt to call back to confirm medication list.  Cardiology recommended pt be on the following:  nebivolol 5mg  daily, hydralazine 100mg  TID, atorvastatin 80mg  daily, amlodipine/valsartan 10/320mg  daily, and ASA 81mg  daily.   Pulmonology recommended pt stay on Trelegy 1 puff daily with albuterol inhaler PRN for SOB, wheezing. (Planned to ask pt if they need PAP assistance with Trelegy Ellipta)  Sharee Pimple

## 2022-12-01 ENCOUNTER — Telehealth: Payer: Self-pay

## 2022-12-01 NOTE — Progress Notes (Signed)
12/01/2022  Patient ID: Kevin [name], male   DOB: 28-Dec-1955, 67 y.o.   MRN: [id number]  Patient outreach attempt to go over current medication list with patient now that regimen has been clarified by both cardiology and pulmonology.  I was not able to reach Kevin [name], but I did leave a HIPAA compliant voicemail with my direct phone number.  I am also sending a MyChart message and will try to reach him again the beginning of next week if I do not hear back.  Lenna Gilford, PharmD, DPLA

## 2022-12-01 NOTE — Telephone Encounter (Signed)
11/24/22 OV note including risk assessment faxed to [facility] @ [phone]

## 2022-12-04 ENCOUNTER — Other Ambulatory Visit: Payer: Self-pay

## 2022-12-04 ENCOUNTER — Other Ambulatory Visit (HOSPITAL_COMMUNITY): Payer: Self-pay

## 2022-12-04 NOTE — Progress Notes (Signed)
Specialty Pharmacy Refill Coordination Note  Kevin [name] is a 67 y.o. male contacted today regarding refills of specialty medication(s) Dolutegravir-Lamivudine   Patient requested Delivery   Delivery date: 12/06/22   Verified address: [address], [place], [address]   Medication will be filled on 12/05/22.

## 2022-12-05 ENCOUNTER — Telehealth: Payer: Self-pay

## 2022-12-05 ENCOUNTER — Ambulatory Visit: Payer: Medicaid Other | Admitting: Dermatology

## 2022-12-05 ENCOUNTER — Other Ambulatory Visit (HOSPITAL_COMMUNITY): Payer: Self-pay

## 2022-12-05 ENCOUNTER — Other Ambulatory Visit: Payer: Self-pay

## 2022-12-05 ENCOUNTER — Encounter: Payer: Self-pay | Admitting: Dermatology

## 2022-12-05 VITALS — BP 139/82 | HR 88

## 2022-12-05 DIAGNOSIS — D492 Neoplasm of unspecified behavior of bone, soft tissue, and skin: Secondary | ICD-10-CM

## 2022-12-05 NOTE — Patient Instructions (Signed)

## 2022-12-05 NOTE — Progress Notes (Signed)
   New Patient Visit   Subjective  Kevin [name] is a 67 y.o. male who presents for the following: growth on left forehead.   Pt has a growth on the left side of his forehead that has been there around 2 years and itches on occasion. He'd like evaluated.  The following portions of the chart were reviewed this encounter and updated as appropriate: medications, allergies, medical history  Review of Systems:  No other skin or systemic complaints except as noted in HPI or Assessment and Plan.  Objective  Well appearing patient in no apparent distress; mood and affect are within normal limits.  A focused examination was performed of the following areas: Left forehead  Relevant exam findings are noted in the Assessment and Plan.   Assessment & Plan    Neoplasm of Skin- Subcutaneous Mass- Likely Exam: Subcutaneous rubbery nodule(s) Location: left temple  Benign-appearing. Exam most consistent with a lipoma. Discussed that a lipoma is a benign fatty growth that can grow over time and sometimes get irritated. Recommend observation if it is not bothersome or changing. Discussed option of ILK injections or surgical excision to remove it if it is growing, symptomatic, or other changes noted. Please call for new or changing lesions so they can be evaluated.  Patient would like to schedule excision. Risks and benefits for surgery discussed with patient  Return for excision.  I, Tillie Fantasia, CMA, am acting as scribe for Gwenith Daily, MD.   Documentation: I have reviewed the above documentation for accuracy and completeness, and I agree with the above.  Gwenith Daily, MD

## 2022-12-05 NOTE — Progress Notes (Signed)
12/05/2022  Patient ID: Kevin [name], male   DOB: Mar 11, 1955, 67 y.o.   MRN: [id number]  Requesting that [facility] contact provider Stefani Dama, PA-C, to request a refill for patient's hydroxyzine.  Since she is not a [facility] provider, I am unable to route a refill to her.    Lenna Gilford, PharmD, DPLA

## 2022-12-05 NOTE — Progress Notes (Unsigned)
12/05/2022  Patient ID: Kevin [name], male   DOB: 12/29/1955, 67 y.o.   MRN: [id number]  Patient outreach to review current medication list with patient:  -He has received all current medications from [facility] via home delivery -Educated him that he is to be using Trelegy daily for maintenance, albuterol rescue inhaler as needed, and nebulizer solutions as needed -Informed him to stop atovastatin 40mg  and start 80mg  tablet sent by pharmacy -Informed him he should be taking nebivolol 5mg  daily and to stop any other beta blockers (metoprolol and carvedilol previously prescribed) -Informed him to start taking ASA 81mg  daily that was received from [facility] -All other medications  were previously being taken as prescribed, and he was advised to continue these (Exforge 10/320, Dovato, hydralazine, montelukast) -Patient is requesting refills for albuterol rescue inhaler and hydroxyzine- sending prescriptions to providers to sign if in agreement -Patient states he may not be getting full benefit of inhalers due to errors in administration, so I have offered to see him in person to review- sending my direct number via MyChart message, so he can contact me directly to schedule or for any future medication needs  Lenna Gilford, PharmD, DPLA

## 2022-12-06 ENCOUNTER — Encounter (HOSPITAL_COMMUNITY): Payer: Self-pay | Admitting: Emergency Medicine

## 2022-12-06 ENCOUNTER — Other Ambulatory Visit (HOSPITAL_COMMUNITY): Payer: Self-pay

## 2022-12-06 ENCOUNTER — Other Ambulatory Visit: Payer: Self-pay

## 2022-12-06 ENCOUNTER — Emergency Department (HOSPITAL_COMMUNITY)
Admission: EM | Admit: 2022-12-06 | Discharge: 2022-12-06 | Disposition: A | Discharge status: Home / Self Care | Payer: Medicaid Other | Attending: Emergency Medicine | Admitting: Emergency Medicine

## 2022-12-06 ENCOUNTER — Emergency Department (HOSPITAL_COMMUNITY): Payer: Medicaid Other

## 2022-12-06 DIAGNOSIS — I5032 Chronic diastolic (congestive) heart failure: Secondary | ICD-10-CM | POA: Insufficient documentation

## 2022-12-06 DIAGNOSIS — I130 Hypertensive heart and chronic kidney disease with heart failure and stage 1 through stage 4 chronic kidney disease, or unspecified chronic kidney disease: Secondary | ICD-10-CM | POA: Insufficient documentation

## 2022-12-06 DIAGNOSIS — R0602 Shortness of breath: Secondary | ICD-10-CM | POA: Diagnosis present

## 2022-12-06 DIAGNOSIS — J441 Chronic obstructive pulmonary disease with (acute) exacerbation: Secondary | ICD-10-CM | POA: Diagnosis not present

## 2022-12-06 DIAGNOSIS — Z79899 Other long term (current) drug therapy: Secondary | ICD-10-CM | POA: Insufficient documentation

## 2022-12-06 DIAGNOSIS — Z7951 Long term (current) use of inhaled steroids: Secondary | ICD-10-CM | POA: Diagnosis not present

## 2022-12-06 DIAGNOSIS — N183 Chronic kidney disease, stage 3 unspecified: Secondary | ICD-10-CM | POA: Insufficient documentation

## 2022-12-06 DIAGNOSIS — Z7982 Long term (current) use of aspirin: Secondary | ICD-10-CM | POA: Diagnosis not present

## 2022-12-06 LAB — BASIC METABOLIC PANEL
Anion gap: 5 (ref 5–15)
BUN: 19 mg/dL (ref 8–23)
CO2: 20 mmol/L — ABNORMAL LOW (ref 22–32)
Calcium: 8 mg/dL — ABNORMAL LOW (ref 8.9–10.3)
Chloride: 115 mmol/L — ABNORMAL HIGH (ref 98–111)
Creatinine, Ser: 2.19 mg/dL — ABNORMAL HIGH (ref 0.61–1.24)
GFR, Estimated: 33 mL/min — ABNORMAL LOW (ref >60)
Glucose, Bld: 91 mg/dL (ref 70–99)
Potassium: 4.2 mmol/L (ref 3.5–5.1)
Sodium: 140 mmol/L (ref 135–145)

## 2022-12-06 LAB — CBC WITH DIFFERENTIAL/PLATELET
Abs Immature Granulocytes: 0.03 10*3/uL (ref 0.00–0.07)
Basophils Absolute: 0.1 10*3/uL (ref 0.0–0.1)
Basophils Relative: 0 %
Eosinophils Absolute: 1 10*3/uL — ABNORMAL HIGH (ref 0.0–0.5)
Eosinophils Relative: 8 %
HCT: 28.4 % — ABNORMAL LOW (ref 39.0–52.0)
Hemoglobin: 9.6 g/dL — ABNORMAL LOW (ref 13.0–17.0)
Immature Granulocytes: 0 %
Lymphocytes Relative: 15 %
Lymphs Abs: 2.1 10*3/uL (ref 0.7–4.0)
MCH: 32.5 pg (ref 26.0–34.0)
MCHC: 33.8 g/dL (ref 30.0–36.0)
MCV: 96.3 fL (ref 80.0–100.0)
Monocytes Absolute: 0.9 10*3/uL (ref 0.1–1.0)
Monocytes Relative: 7 %
Neutro Abs: 9.8 10*3/uL — ABNORMAL HIGH (ref 1.7–7.7)
Neutrophils Relative %: 70 %
Platelets: 231 10*3/uL (ref 150–400)
RBC: 2.95 MIL/uL — ABNORMAL LOW (ref 4.22–5.81)
RDW: 14.6 % (ref 11.5–15.5)
WBC: 13.9 10*3/uL — ABNORMAL HIGH (ref 4.0–10.5)
nRBC: 0 % (ref 0.0–0.2)

## 2022-12-06 MED ORDER — ALBUTEROL SULFATE HFA 108 (90 BASE) MCG/ACT IN AERS
2.0000 | INHALATION_SPRAY | Freq: Four times a day (QID) | RESPIRATORY_TRACT | 0 refills | Status: DC | PRN
Start: 2022-12-06 — End: 2022-12-21

## 2022-12-06 MED ORDER — IPRATROPIUM-ALBUTEROL 0.5-2.5 (3) MG/3ML IN SOLN
3.0000 mL | Freq: Once | RESPIRATORY_TRACT | Status: AC
  Administered 2022-12-06: 3 mL via RESPIRATORY_TRACT
  Filled 2022-12-06: qty 3

## 2022-12-06 MED ORDER — ALBUTEROL SULFATE (2.5 MG/3ML) 0.083% IN NEBU
15.0000 mg/h | INHALATION_SOLUTION | Freq: Once | RESPIRATORY_TRACT | Status: AC
  Administered 2022-12-06: 15 mg/h via RESPIRATORY_TRACT
  Filled 2022-12-06: qty 18

## 2022-12-06 MED ORDER — ALBUTEROL SULFATE HFA 108 (90 BASE) MCG/ACT IN AERS
1.0000 | INHALATION_SPRAY | Freq: Four times a day (QID) | RESPIRATORY_TRACT | 0 refills | Status: DC | PRN
  Filled 2022-12-06 – 2022-12-14 (×3): qty 18, 25d supply, fill #0

## 2022-12-06 MED ORDER — DEXAMETHASONE SODIUM PHOSPHATE 10 MG/ML IJ SOLN
10.0000 mg | Freq: Once | INTRAMUSCULAR | Status: AC
  Administered 2022-12-06: 10 mg via INTRAVENOUS
  Filled 2022-12-06: qty 1

## 2022-12-06 MED ORDER — DOXYCYCLINE HYCLATE 100 MG PO CAPS: 100.0000 mg | ORAL_CAPSULE | Freq: Two times a day (BID) | ORAL | 0 refills | Status: DC

## 2022-12-06 NOTE — ED Notes (Signed)
RT initiated breathing tx. Please dc in 1hr.

## 2022-12-06 NOTE — ED Provider Notes (Signed)
[place] EMERGENCY DEPARTMENT AT [facility] Provider Note   CSN: [id number] Arrival date & time: 12/06/22  0004     History  Chief Complaint  Patient presents with   Shortness of Breath    Kevin [name] is a 67 y.o. male.  HPI     This is a 67 year old male who presents with shortness of breath.  Patient reports onset of worsening shortness of breath around 8 PM.  No fevers.  Reports chronic nonproductive cough.  Attempted inhaler at home with minimal relief.  Denies lower extremity swelling.  History of ED and CHF.  Home Medications Prior to Admission medications   Medication Sig Start Date End Date Taking? Authorizing Provider  albuterol (VENTOLIN HFA) 108 (90 Base) MCG/ACT inhaler Inhale 2 puffs into the lungs every 6 (six) hours as needed for wheezing or shortness of breath. 12/06/22  Yes Anjelika Ausburn, Mayer Masker, MD  doxycycline (VIBRAMYCIN) 100 MG capsule Take 1 capsule (100 mg total) by mouth 2 (two) times daily. 12/06/22  Yes Lisandro Meggett, Mayer Masker, MD  acetylcysteine (MUCOMYST) 10 % nebulizer solution Take 4 mLs by nebulization every 4 (four) hours. 09/19/22   [name], MD  albuterol (VENTOLIN HFA) 108 (90 Base) MCG/ACT inhaler Inhale 1-2 puffs into the lungs every 6 (six) hours as needed for wheezing or shortness of breath. 11/18/22   Derwood Kaplan, MD  amLODipine-valsartan (EXFORGE) 10-320 MG tablet Take 1 tablet by mouth daily. 11/17/22   Corrin Parker, PA-C  aspirin EC 81 MG tablet Take 1 tablet (81 mg total) by mouth daily, swallow whole 04/28/22   [name], MD  atorvastatin (LIPITOR) 80 MG tablet Take 1 tablet (80 mg total) by mouth daily. 10/25/22     dolutegravir-lamiVUDine (DOVATO) 50-300 MG tablet Take 1 tablet by mouth daily. 07/06/22   Randall Hiss, MD  Fluticasone-Umeclidin-Vilant (TRELEGY ELLIPTA) 200-62.5-25 MCG/ACT AEPB Inhale 1 puff into the lungs daily. 09/08/22     hydrALAZINE (APRESOLINE) 100 MG tablet Take 1 tablet (100  mg total) by mouth 3 (three) times daily. 10/13/22   Rai, Delene Ruffini, MD  hydrOXYzine (ATARAX) 25 MG tablet Take 25-50 mg by mouth. 3-4 times daily as needed for anxiety    Jamey Reas, PA-C  ipratropium-albuterol (DUONEB) 0.5-2.5 (3) MG/3ML SOLN Take 3 mLs by nebulization every 6 (six) hours as needed (wheezing or shortness of breath). 08/17/22   Gwyneth Sprout, MD  montelukast (SINGULAIR) 10 MG tablet Take 1 tablet (10 mg total) by mouth at bedtime. 09/08/22     naloxone (NARCAN) nasal spray 4 mg/0.1 mL Call 911. Spray contents of 1 spray into one nostril. Repeat in 2-3 min if symptoms of opioid emergency persists; alternate nostrils 10/20/22     nebivolol (BYSTOLIC) 5 MG tablet Take 1 tablet (5 mg total) by mouth daily. 10/24/22         Allergies    Patient has no known allergies.    Review of Systems   Review of Systems  Constitutional:  Negative for fever.  Respiratory:  Positive for cough and shortness of breath.   Cardiovascular:  Negative for chest pain.  All other systems reviewed and are negative.   Physical Exam Updated Vital Signs BP (!) 166/112   Pulse 77   Temp 98.2 F (36.8 C) (Oral)   Resp 15   Ht 1.829 m (6')   Wt 72.6 kg   SpO2 100%   BMI 21.70 kg/m  Physical Exam Vitals and nursing  note reviewed.  Constitutional:      Appearance: He is well-developed. He is ill-appearing. He is not toxic-appearing.  HENT:     Head: Normocephalic and atraumatic.  Eyes:     Pupils: Pupils are equal, round, and reactive to light.  Cardiovascular:     Rate and Rhythm: Normal rate and regular rhythm.     Heart sounds: Normal heart sounds. No murmur heard. Pulmonary:     Effort: Tachypnea present. No respiratory distress.     Breath sounds: Wheezing present.     Comments: Diminished breath sounds in all lung fields, expiratory wheezing noted, mild tachypnea, speaking in short sentences Abdominal:     General: Bowel sounds are normal.     Palpations: Abdomen is soft.      Tenderness: There is no abdominal tenderness. There is no rebound.  Musculoskeletal:     Cervical back: Neck supple.  Lymphadenopathy:     Cervical: No cervical adenopathy.  Skin:    General: Skin is warm and dry.  Neurological:     Mental Status: He is alert and oriented to person, place, and time.  Psychiatric:        Mood and Affect: Mood normal.     ED Results / Procedures / Treatments   Labs (all labs ordered are listed, but only abnormal results are displayed) Labs Reviewed  CBC WITH DIFFERENTIAL/PLATELET - Abnormal; Notable for the following components:      Result Value   WBC 13.9 (*)    RBC 2.95 (*)    Hemoglobin 9.6 (*)    HCT 28.4 (*)    Neutro Abs 9.8 (*)    Eosinophils Absolute 1.0 (*)    All other components within normal limits  BASIC METABOLIC PANEL - Abnormal; Notable for the following components:   Chloride 115 (*)    CO2 20 (*)    Creatinine, Ser 2.19 (*)    Calcium 8.0 (*)    GFR, Estimated 33 (*)    All other components within normal limits    EKG EKG Interpretation Date/Time:  Wednesday December 06 2022 00:39:34 EDT Ventricular Rate:  78 PR Interval:  179 QRS Duration:  93 QT Interval:  378 QTC Calculation: 431 R Axis:   77  Text Interpretation: Sinus rhythm Atrial premature complexes in couplets Left ventricular hypertrophy Anterior Q waves, possibly due to LVH Abnormal T, consider ischemia, diffuse leads similar to prior Confirmed by Ross Marcus ([id number]) on 12/06/2022 1:15:16 AM  Radiology DG Chest Portable 1 View  Result Date: 12/06/2022 CLINICAL DATA:  Shortness of breath, increasing. History of COPD and CHF. EXAM: PORTABLE CHEST 1 VIEW COMPARISON:  11/18/2022 FINDINGS: Heart size and pulmonary vascularity are normal. Mediastinal contours appear intact. Emphysematous changes in the lungs. No airspace disease or consolidation. No pleural effusions. No pneumothorax. IMPRESSION: Emphysematous changes in the lungs. No evidence of  active pulmonary disease. Electronically Signed   By: Burman Nieves M.D.   On: 12/06/2022 02:55    Procedures .Critical Care  Performed by: Shon Baton, MD Authorized by: Shon Baton, MD   Critical care provider statement:    Critical care time (minutes):  31   Critical care was necessary to treat or prevent imminent or life-threatening deterioration of the following conditions:  Respiratory failure   Critical care was time spent personally by me on the following activities:  Development of treatment plan with patient or surrogate, discussions with consultants, evaluation of patient's response to treatment, examination of patient, ordering  and review of laboratory studies, ordering and review of radiographic studies, ordering and performing treatments and interventions, pulse oximetry, re-evaluation of patient's condition and review of old charts     Medications Ordered in ED Medications  ipratropium-albuterol (DUONEB) 0.5-2.5 (3) MG/3ML nebulizer solution 3 mL (3 mLs Nebulization Given 12/06/22 0130)  dexamethasone (DECADRON) injection 10 mg (10 mg Intravenous Given 12/06/22 0135)  albuterol (PROVENTIL) (2.5 MG/3ML) 0.083% nebulizer solution (15 mg/hr Nebulization Given 12/06/22 0221)    ED Course/ Medical Decision Making/ A&P Clinical Course as of 12/06/22 0436  Wed Dec 06, 2022  0345 On recheck, patient states he feels better.  Breath sounds improved.  Will ambulate with pulse ox. [CH]    Clinical Course User Index [CH] Kalisha Keadle, Mayer Masker, MD                                 Medical Decision Making Amount and/or Complexity of Data Reviewed Labs: ordered. Radiology: ordered.  Risk Prescription drug management.   This patient presents to the ED for concern of shortness of breath, this involves an extensive number of treatment options, and is a complaint that carries with it a high risk of complications and morbidity.  I considered the following differential  and admission for this acute, potentially life threatening condition.  The differential diagnosis includes COPD exacerbation, CHF, pneumonia, pneumothorax, ACS  MDM:    This is a 67 year old male who presents with concerns for shortness of breath.  He is overall nontoxic but is tachypneic and visibly short of breath.  He has wheezing on exam.  Does not appear volume overloaded.  Lower suspicion for CHF.  Vital signs are notable for blood pressure of 166/112.  Pulse ox is reassuring.  Patient initially given a DuoNeb.  He was subsequently given a continuous neb.  He was given Decadron.  Most recently seen several weeks ago for the same.  Of note on his labs, he does have a slightly increased leukocytosis.  Chest x-ray does not show any pneumonia.  However, given his COPD will cover with doxycycline.  Patient had progressive improvement of symptoms.  He was able to ambulate and maintain pulse ox.  Discussed with him that he needs to use his inhaler frequently over the next 6 to 8 hours while steroids take effect.  (Labs, imaging, consults)  Labs: I Ordered, and personally interpreted labs.  The pertinent results include: CBC, BMP  Imaging Studies ordered: I ordered imaging studies including chest x-ray I independently visualized and interpreted imaging. I agree with the radiologist interpretation  Additional history obtained from chart review.  External records from outside source obtained and reviewed including prior evaluations  Cardiac Monitoring: The patient was maintained on a cardiac monitor.  If on the cardiac monitor, I personally viewed and interpreted the cardiac monitored which showed an underlying rhythm of: Sinus rhythm  Reevaluation: After the interventions noted above, I reevaluated the patient and found that they have :improved  Social Determinants of Health:  lives independently  Disposition: Discharge  Co morbidities that complicate the patient evaluation  Past Medical  History:  Diagnosis Date   AIDS (acquired immune deficiency syndrome) (HCC) 08/17/2016   Chronic diastolic CHF (congestive heart failure), NYHA class 3 (HCC) 01/2016   Chronic lower back pain    CKD (chronic kidney disease), stage III (HCC)    COPD (chronic obstructive pulmonary disease) (HCC)    Gout    "forearms,  hands, ankles, feet" (06/05/2016)   Headache    "weekly" (06/05/2016)   Heart murmur    Hypertension    Hypertensive crisis 08/15/2017   Lipoma 07/06/2022   OSA on CPAP    PAD (peripheral artery disease) (HCC)    PAF (paroxysmal atrial fibrillation) (HCC) 01/2016   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    Dispense:  1 each    Refill:  0   doxycycline (VIBRAMYCIN) 100 MG capsule    Sig: Take 1 capsule (100 mg total) by mouth 2 (two) times daily.    Dispense:  20 capsule    Refill:  0    I have reviewed the patients home medicines and have made adjustments as needed  Problem List / ED Course: Problem List Items Addressed This Visit   None Visit Diagnoses     COPD exacerbation (HCC)    -  Primary   Relevant Medications   ipratropium-albuterol (DUONEB) 0.5-2.5 (3) MG/3ML nebulizer solution 3 mL (Completed)   dexamethasone (DECADRON) injection 10 mg (Completed)   albuterol (PROVENTIL) (2.5 MG/3ML) 0.083% nebulizer solution (Completed)   albuterol (VENTOLIN HFA) 108 (90 Base) MCG/ACT inhaler                   Final Clinical Impression(s) / ED Diagnoses Final diagnoses:  COPD exacerbation (HCC)    Rx / DC Orders ED Discharge Orders           Ordered    albuterol (VENTOLIN HFA) 108 (90 Base) MCG/ACT inhaler  Every 6 hours PRN        12/06/22 0434    doxycycline (VIBRAMYCIN) 100 MG capsule  2 times daily        12/06/22 0436              Regginald Pask, Mayer Masker, MD 12/06/22 [phone]

## 2022-12-06 NOTE — ED Notes (Signed)
Ambulated pt while monitoring pulse ox. Pt ambulated without incident. Pt maintained between 93% and 95% while ambulating. Pt is back in bed at this time with family by bedside.

## 2022-12-06 NOTE — ED Triage Notes (Signed)
  Patient comes in with SOB that started around 2000 last night.  Patient states he was just laying in the bed and started having trouble breathing.  Hx of CHF, COPD, and OSA.  Patient having labored breathing in triage but SPO2 98-99% on RA.  No sick contacts that he is aware of.  Endorses dyspnea.  Pain 4/10.

## 2022-12-06 NOTE — Discharge Instructions (Addendum)
You were seen today for shortness of breath.  This is likely related to a COPD exacerbation.  Take antibiotics as prescribed.  It is very important that you use your inhaler at home especially over the next 6 to 8 hours.  Make sure that you are avoiding smoking smoke exposure.

## 2022-12-07 ENCOUNTER — Telehealth: Payer: Self-pay

## 2022-12-07 DIAGNOSIS — Z7689 Persons encountering health services in other specified circumstances: Secondary | ICD-10-CM

## 2022-12-07 NOTE — Transitions of Care (Post Inpatient/ED Visit) (Signed)
12/07/2022  Name: Kevin [name] MRN: [id number] DOB: [birth year]  Today's TOC FU Call Status: Today's TOC FU Call Status:: Unsuccessful Call (1st Attempt) Unsuccessful Call (1st Attempt) Date: 12/07/22  Attempted to reach the patient regarding the most recent Inpatient/ED visit.  Follow Up Plan: Additional outreach attempts will be made to reach the patient to complete the Transitions of Care (Post Inpatient/ED visit) call.   Signature Arvil Persons, BSN, Charity fundraiser

## 2022-12-08 ENCOUNTER — Other Ambulatory Visit: Payer: Self-pay | Admitting: *Deleted

## 2022-12-08 NOTE — Patient Instructions (Signed)
Visit Information  [name]  - as a part of your Medicaid benefit, you are eligible for care management and care coordination services at no cost or copay. I was unable to reach you by phone today but would be happy to help you with your health related needs. Please feel free to call me @ [phone].   A member of the Managed Medicaid care management team will reach out to you again over the next 7 days.   [name] RN, BSN Kevin [name]  [facility] RN Care Coordinator [phone]

## 2022-12-08 NOTE — Patient Outreach (Signed)
Medicaid Managed Care   Unsuccessful Attempt Note   12/08/2022 Name: Kevin [name] MRN: [id number] DOB: [birth year]  Referred by: [name], MD Reason for referral : High Risk Managed Medicaid (Unsuccessful RNCM follow up telephone outreach)   An unsuccessful telephone outreach was attempted today. The patient was referred to the case management team for assistance with care management and care coordination.    Follow Up Plan: The Managed Medicaid care management team will reach out to the patient again over the next 7 days.    [name] RN, BSN [place]  [facility] RN Care Coordinator [phone]

## 2022-12-11 ENCOUNTER — Other Ambulatory Visit (HOSPITAL_COMMUNITY): Payer: Self-pay

## 2022-12-11 NOTE — Transitions of Care (Post Inpatient/ED Visit) (Signed)
12/11/2022  Name: Kevin [name] MRN: [id number] DOB: 15-Sep-1955  Today's TOC FU Call Status: Today's TOC FU Call Status:: Unsuccessful Call (1st Attempt) Unsuccessful Call (1st Attempt) Date: 11/20/22  Attempted to reach the patient regarding the most recent Inpatient/ED visit.  Follow Up Plan: No further outreach attempts will be made at this time. We have been unable to contact the patient.  Signature Arvil Persons, BSN, Charity fundraiser

## 2022-12-11 NOTE — Telephone Encounter (Signed)
Informed pt of the note below. No further questions.

## 2022-12-11 NOTE — Telephone Encounter (Signed)
Called [facility] to verify refills on Fluticasone-Umeclidin-Vilant (TRELEGY ELLIPTA) 200-62.5-25 MCG/ACT AEPB and albuterol (VENTOLIN HFA) 108 (90 Base) MCG/ACT inhaler. Pt has 1 RF on Albuterol, which insurance will pay, and 4 Rfs on Trelelgy, which insurance will not cover until 12/14/22. Both are scheduled to process and ship to patient's home on 12/14/22, expected delivery date 12/15/22. Will call and inform pt.

## 2022-12-13 ENCOUNTER — Telehealth: Payer: Self-pay | Admitting: Pulmonary Disease

## 2022-12-13 ENCOUNTER — Institutional Professional Consult (permissible substitution): Payer: Medicaid Other | Admitting: Pulmonary Disease

## 2022-12-13 NOTE — Telephone Encounter (Signed)
PT came in for appt 10/30. It was cancelled by Sherren Mocha the day before with the reason "Provider". Verlon Au cancelled it in error, she said. I went to rebook and noticed PT has Medicare. I advised he needed a referral to get an appointment to be paid by Medicare. His daughter used to work here and she called [name] to say he did not need a referral to be seen. It elevated to [facility].

## 2022-12-14 ENCOUNTER — Other Ambulatory Visit: Payer: Self-pay

## 2022-12-14 ENCOUNTER — Encounter: Payer: Self-pay | Admitting: Internal Medicine

## 2022-12-14 ENCOUNTER — Ambulatory Visit: Payer: Medicaid Other | Admitting: Internal Medicine

## 2022-12-14 ENCOUNTER — Other Ambulatory Visit (HOSPITAL_COMMUNITY): Payer: Self-pay

## 2022-12-14 VITALS — BP 203/117 | HR 63 | Ht 72.0 in | Wt 161.4 lb

## 2022-12-14 DIAGNOSIS — J4489 Other specified chronic obstructive pulmonary disease: Secondary | ICD-10-CM

## 2022-12-14 DIAGNOSIS — I10 Essential (primary) hypertension: Secondary | ICD-10-CM | POA: Diagnosis not present

## 2022-12-14 DIAGNOSIS — J439 Emphysema, unspecified: Secondary | ICD-10-CM

## 2022-12-14 DIAGNOSIS — Z23 Encounter for immunization: Secondary | ICD-10-CM | POA: Diagnosis not present

## 2022-12-14 DIAGNOSIS — G4733 Obstructive sleep apnea (adult) (pediatric): Secondary | ICD-10-CM | POA: Diagnosis not present

## 2022-12-14 NOTE — Patient Instructions (Signed)
Order Patsy Lager - please provide mask for his nebulizer machine    Dx COPD mixed type  Order- [employer]- please refit mask of choice for better seal and comfort for his NIV machine and provide current download  Order- Flu vax standard  Keep appointment with Dr Delton Coombes - your pulmonary (lung) doctor

## 2022-12-14 NOTE — Progress Notes (Signed)
Subjective:    Patient ID: Kevin [name], male    DOB: [birth year], 67 y.o.   MRN: [id number]  Dr Delton Coombes HPI 67 year old patient with a history of tobacco use (40+ pk-yrs) and COPD, renal cell cancer, HIV on therapy, hypertension and atrial fibrillation with diastolic CHF, OSA on CPAP, gout.  He has been seen by Dr. Thora Lance in our office for his COPD and also pulmonary nodular disease on a CT chest 11/2020.  Repeat CT scan of the chest was done as below as well as subsequent PET scan.  CT scan of the chest 09/06/2022 reviewed by me shows multiple stable right sided solid and subsolid pulmonary nodules that were stable or regressed.  2 nodules had increased in size including a 7 mm solid right upper lobe nodule, 15 mm mixed density right lower lobe nodule.  This prompted PET scan. He has been experiencing episodes of acute dyspnea, has been back and forth to the ED multiple times this year. Was started on Trelegy in June. Can happen w exertion, chores or at rest. Does not wake him from sleep. He coughs through the day, produces mucous in the am. ? Whether there may be a component of panic / anxiety. Albuterol nebs 3x a day. Unclear triggers. Does have trouble in hot air.   PET scan 09/21/2022 reviewed by me, shows mild hypermetabolism within the mediastinal nodes with SUV max 4.1 of unclear significance.  The right upper lobe peribronchovascular nodules measured at 5 mm.  None of the groundglass nodules had any hypermetabolism.  There was some nonspecific hypermetabolism in the right apex as well as inferior lingula and an area of some minimal airspace disease that was new compared with the CT done 7/24  [name] 11/29/2022 --67 year old smoker with COPD, HIV, renal cell cancer, hypertension, A-fib with diastolic dysfunction, OSA on CPAP.  He is preparing to undergo colonoscopy with [facility] gastroenterology and needs preoperative surgical evaluation.  I saw him in August for an abnormal CT scan of the chest with  small right upper lobe and right lower lobe pulmonary nodules.  These look as they may have evolved on CT but they were negative on PET scan.  We plan for repeat imaging in January 2025 he was in the emergency department 11/18/2022 with acute exacerbation of COPD, received an extended nebulizer treatment, steroids and was sent out on prednisone and doxycycline.  His maintenance medication has been Trelegy, albuterol or DuoNeb as needed. He has been off the trelegy for over a month. He is still smoking a few cig a day. He is feeling better.   PFT from 11/24/22 reviewed and shows severe obstruction without a bronchodilator response, FEV1 42% predicted.  Lung volumes are hyperinflated.  Diffusion capacity decreased and corrects to normal range when adjusted for alveolar volume ================================================== 12/14/22- 67 yoM new to me for sleep evaluation-  see above from Dr Delton Coombes (due back with Dr Delton Coombes in January)..   Smoker("2 cigs/day") with COPD, HIV, renal cell cancer, hypertension, A-fib with diastolic dysfunction, OSA on CPAP -Neb Muccomyst, Neb Duoneb, Albuterol hfa, Trelegy 200, Singulair,  NPSG 05/07/19- AHI 62.9/hr, desat to 92%, BIPAP to 18/14. Frequent Centrals> recommended AutoServoVentilation study. Body weight 180 lbs ASV titration 06/10/19- EPAP min 7, Max 7, PS min5, Max 20, Breath rate AutoBrMode Epworth score-15 BP on arrival 212/130    no BP meds taken today Body weight today  Daughter here Bedtime 3AM, sleep latency 30 minutes, No WASO, up 8AM. Admits doesn't use BiPAP every night.  Prior to Admission medications   Medication Sig Start Date End Date Taking? Authorizing Provider  albuterol (VENTOLIN HFA) 108 (90 Base) MCG/ACT inhaler Inhale 1-2 puffs into the lungs every 6 (six) hours as needed for wheezing or shortness of breath. Patient not taking: Reported on 01/04/2023 12/06/22  Yes Cobb, Ruby Cola, NP   amLODipine-valsartan (EXFORGE) 10-320 MG tablet Take 1 tablet by mouth daily. 11/17/22  Yes Corrin Parker, PA-C  aspirin EC 81 MG tablet Take 1 tablet (81 mg total) by mouth daily, swallow whole 04/28/22  Yes [name], MD  atorvastatin (LIPITOR) 80 MG tablet Take 1 tablet (80 mg total) by mouth daily. 10/25/22  Yes   dolutegravir-lamiVUDine (DOVATO) 50-300 MG tablet Take 1 tablet by mouth daily. 07/06/22  Yes [name], [name], MD  Fluticasone-Umeclidin-Vilant (TRELEGY ELLIPTA) 200-62.5-25 MCG/ACT AEPB Inhale 1 puff into the lungs daily. 09/08/22  Yes   hydrALAZINE (APRESOLINE) 100 MG tablet Take 1 tablet (100 mg total) by mouth 3 (three) times daily. 10/13/22  Yes Rai, Ripudeep K, MD  montelukast (SINGULAIR) 10 MG tablet Take 1 tablet (10 mg total) by mouth at bedtime. 09/08/22  Yes   naloxone (NARCAN) nasal spray 4 mg/0.1 mL Call 911. Spray contents of 1 spray into one nostril. Repeat in 2-3 min if symptoms of opioid emergency persists; alternate nostrils 10/20/22  Yes   nebivolol (BYSTOLIC) 5 MG tablet Take 1 tablet (5 mg total) by mouth daily. 10/24/22  Yes   azithromycin (ZITHROMAX) 250 MG tablet Take 1 tablet (250 mg total) by mouth daily. Take first 2 tablets together, then 1 every day until finished. 01/04/23   Mardella Layman, MD  busPIRone (BUSPAR) 7.5 MG tablet Take 1 tablet (7.5 mg total) by mouth 2 (two) times daily. Patient not taking: Reported on 12/20/2022 12/19/22 03/19/23  [name], MD  HYDROcodone-acetaminophen [facility]) 7.5-325 MG tablet Take 1 tablet by mouth every 6 (six) hours as needed for moderate pain (pain score 4-6). 01/04/23   Mardella Layman, MD  predniSONE (DELTASONE) 20 MG tablet Take 2 tablets (40 mg total) by mouth daily. 01/04/23   Mardella Layman, MD  valACYclovir (VALTREX) 1000 MG tablet Take 1 tablet (1,000 mg total) by mouth 3 (three) times daily. 01/04/23   Mardella Layman, MD   Past Medical History:  Diagnosis Date   AIDS (acquired immune deficiency  syndrome) (HCC) 08/17/2016   Amaurosis fugax 08/18/2022   Chronic diastolic CHF (congestive heart failure), NYHA class 3 (HCC) 01/2016   Chronic lower back pain    CKD (chronic kidney disease), stage III (HCC)    COPD (chronic obstructive pulmonary disease) (HCC)    Gout    "forearms, hands, ankles, feet" (06/05/2016)   Headache    "weekly" (06/05/2016)   Heart murmur    Hypertension    Hypertensive crisis 08/15/2017   Lipoma 07/06/2022   OSA on CPAP    PAD (peripheral artery disease) (HCC)    PAF (paroxysmal atrial fibrillation) (HCC) 01/2016   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   Past Surgical History:  Procedure Laterality Date   IR RADIOLOGIST EVAL & MGMT  05/31/2021   IR RADIOLOGIST EVAL & MGMT  07/26/2021   LEFT HEART CATH AND CORONARY ANGIOGRAPHY N/A 10/23/2016   Procedure: LEFT HEART CATH AND CORONARY ANGIOGRAPHY;  Surgeon: Marykay Lex, MD;  Location: [facility] INVASIVE CV LAB;  Service: Cardiovascular;  Laterality: N/A;   LOWER EXTREMITY ANGIOGRAPHY N/A 07/17/2016   Procedure: Lower Extremity Angiography;  Surgeon: Runell Gess, MD;  Location: [facility] INVASIVE CV LAB;  Service: Cardiovascular;  Laterality: N/A;   LOWER EXTREMITY INTERVENTION N/A 06/05/2016   Procedure: Lower Extremity Intervention;  Surgeon: Runell Gess, MD;  Location: [facility] INVASIVE CV LAB;  Service: Cardiovascular;  Laterality: N/A;   PERIPHERAL VASCULAR ATHERECTOMY Right 07/17/2016   Procedure: Peripheral Vascular Atherectomy;  Surgeon: Runell Gess, MD;  Location: [facility] INVASIVE CV LAB;  Service: Cardiovascular;  Laterality: Right;  SFA   PERIPHERAL VASCULAR INTERVENTION  06/05/2016   Procedure: Peripheral Vascular Intervention;  Surgeon: Runell Gess, MD;  Location: [facility] INVASIVE CV LAB;  Service: Cardiovascular;;  left SFA   RADIOLOGY WITH ANESTHESIA Right 06/29/2021   Procedure: RIGHT RENAL CRYOABLATION;  Surgeon: Richarda Overlie, MD;  Location: [facility] ORS;  Service: Anesthesiology;  Laterality: Right;    Family History  Problem Relation Age of Onset   High blood pressure Mother    Lupus Mother    Seizures Daughter    Heart attack Daughter    Social History   Socioeconomic History   Marital status: Significant Other    Spouse name: Not on file   Number of children: Not on file   Years of education: Not on file   Highest education level: Not on file  Occupational History   Not on file  Tobacco Use   Smoking status: Every Day    Current packs/day: 0.10    Average packs/day: 0.1 packs/day for 42.0 years (4.2 ttl pk-yrs)    Types: Cigarettes, E-cigarettes    Passive exposure: Never   Smokeless tobacco: Never   Tobacco comments:    1-2 cigarettes a day 09/29/22 [place]  Vaping Use   Vaping status: Every Day   Substances: Nicotine, Flavoring  Substance and Sexual Activity   Alcohol use: Not Currently    Alcohol/week: 2.0 standard drinks of alcohol    Types: 2 Cans of beer per week    Comment: rare   Drug use: Not Currently    Comment: rare   Sexual activity: Not Currently    Comment: declined condoms  Other Topics Concern   Not on file  Social History Narrative   Right handed   Social Determinants of Health   Financial Resource Strain: High Risk (10/05/2022)   Overall Financial Resource Strain (CARDIA)    Difficulty of Paying Living Expenses: Hard  Food Insecurity: No Food Insecurity (12/21/2022)   Hunger Vital Sign    Worried About Running Out of Food in the Last Year: Never true    Ran Out of Food in the Last Year: Never true  Transportation Needs: No Transportation Needs (12/21/2022)   PRAPARE - Administrator, Civil Service (Medical): No    Lack of Transportation (Non-Medical): No  Physical Activity: Inactive (10/05/2022)   Exercise Vital Sign    Days of Exercise per Week: 0 days    Minutes of Exercise per Session: 0 min  Stress: Stress Concern Present (05/04/2022)   [employer] of Occupational Health - Occupational Stress Questionnaire    Feeling  of Stress : To some extent  Social Connections: Not on file  Intimate Partner Violence: Not At Risk (12/21/2022)   Humiliation, Afraid, Rape, and Kick questionnaire    Fear of Current or Ex-Partner: No    Emotionally Abused: No    Physically Abused: No    Sexually Abused: No   ROS-see HPI   + =  positive Constitutional:    weight loss, night sweats, fevers, chills, fatigue, lassitude. HEENT:    headaches, difficulty swallowing, tooth/dental problems, sore throat,       sneezing, itching, ear ache, nasal congestion, post nasal drip, snoring CV:    chest pain, orthopnea, PND, swelling in lower extremities, anasarca,     dizziness, +palpitations Resp:   +shortness of breath with exertion or at rest.                productive cough,   non-productive cough, coughing up of blood.              change in color of mucus.  wheezing.   Skin:    rash or lesions. GI:  No-   heartburn, indigestion, abdominal pain, nausea, vomiting, diarrhea,                 change in bowel habits, loss of appetite GU: dysuria, change in color of urine, no urgency or frequency.   flank pain. MS:   joint pain, stiffness, decreased range of motion, back pain. Neuro-     nothing unusual Psych:  change in mood or affect.  depression or +anxiety.   memory loss.  OBJ- Physical Exam     +note BP General- Alert, Oriented, Affect-appropriate, Distress- none acute, +slender Skin- rash-none, lesions- none, excoriation- none Lymphadenopathy- none Head- atraumatic            Eyes- Gross vision intact, PERRLA, conjunctivae and secretions clear            Ears- Hearing, canals-normal            Nose- Clear, no-Septal dev, mucus, polyps, erosion, perforation             Throat- Mallampati II , mucosa clear , drainage- none, tonsils- atrophic Neck- flexible , trachea midline, no stridor , thyroid nl, carotid no bruit Chest - symmetrical excursion , unlabored           Heart/CV- RRR , no murmur , no gallop  , no rub, nl s1 s2                            - JVD- none , edema- none, stasis changes- none, varices- none           Lung- clear to P&A/ +diminished, wheeze- none, cough- none , dullness-none, rub- none           Chest wall-  Abd-  Br/ Gen/ Rectal- Not done, not indicated Extrem- cyanosis- none, clubbing, none, atrophy- none, strength- nl Neuro- grossly intact to observation

## 2022-12-14 NOTE — Telephone Encounter (Signed)
PT was sched. NFN

## 2022-12-15 ENCOUNTER — Telehealth: Payer: Self-pay

## 2022-12-15 NOTE — Progress Notes (Signed)
   Care Guide Note  12/15/2022 Name: JAX KENTNER MRN: [id number] DOB: 1955/08/04  Referred by: [name], MD Reason for referral : Care Coordination (Outreach to schedule with Pharm d )   MYKAL BATIZ is a 67 y.o. year old male who is a primary care patient of [name], MD. [name] was referred to the pharmacist for assistance related to COPD.    Successful contact was made with the patient to discuss pharmacy services including being ready for the pharmacist to call at least 5 minutes before the scheduled appointment time, to have medication bottles and any blood sugar or blood pressure readings ready for review. The patient agreed to meet with the pharmacist via with the pharmacist via telephone visit on (date/time).  12/18/2022  Penne Lash, RMA Care Guide [facility]  [place], [place] [address] Direct Dial: [phone] [email or web address]

## 2022-12-18 ENCOUNTER — Other Ambulatory Visit (HOSPITAL_COMMUNITY): Payer: Self-pay

## 2022-12-18 ENCOUNTER — Other Ambulatory Visit: Payer: Medicaid Other

## 2022-12-18 NOTE — Progress Notes (Signed)
   12/18/2022  Patient ID: Kevin [name], male   DOB: Jun 15, 1955, 67 y.o.   MRN: [id number]  Patient's daughter, Antonieta Iba [facility]), calling because she saw my MyChart message sent to [name].  She states he does not have his Trelegy or albuterol rescue inhaler, but it does appear these were filled by [facility] on 10/31.  Contacted the pharmacy to verify when these will be delivered, and both inhalers were delivered on 11/1 directly to someone.  Contacted daughter, and she states he likely did receive these and just did not tell her.  Patient is not available to speak with, but his daughter will reach out to me if he DOES NOT have the inhalers.  Informed that he will just need to call [facility] (phone number on medication labels) to request refills when he starts to run low on medications.  Lenna Gilford, PharmD, DPLA

## 2022-12-18 NOTE — Progress Notes (Signed)
   12/18/2022  Patient ID: Kevin [name], male   DOB: May 05, 1955, 67 y.o.   MRN: [id number]  Patient outreach for scheduled telephone visit unsuccessful.  I was able to leave HIPAA compliant voicemail with my direct phone number.  I am also sending a MyChart message to attempt to reschedule visit to assist with access/adherence to prescribed inhalers.  If I do not hear back from the patient, I will try to call again next week.  Lenna Gilford, PharmD, DPLA

## 2022-12-19 ENCOUNTER — Ambulatory Visit: Payer: Medicaid Other | Admitting: *Deleted

## 2022-12-19 ENCOUNTER — Encounter: Payer: Self-pay | Admitting: Family Medicine

## 2022-12-19 ENCOUNTER — Other Ambulatory Visit: Payer: Self-pay

## 2022-12-19 ENCOUNTER — Ambulatory Visit: Payer: 59 | Admitting: Family Medicine

## 2022-12-19 VITALS — BP 139/80 | HR 80 | Temp 97.2°F | Wt 161.6 lb

## 2022-12-19 DIAGNOSIS — I1A0 Resistant hypertension: Secondary | ICD-10-CM | POA: Diagnosis not present

## 2022-12-19 DIAGNOSIS — G4733 Obstructive sleep apnea (adult) (pediatric): Secondary | ICD-10-CM | POA: Diagnosis not present

## 2022-12-19 DIAGNOSIS — I5032 Chronic diastolic (congestive) heart failure: Secondary | ICD-10-CM | POA: Diagnosis not present

## 2022-12-19 DIAGNOSIS — I2510 Atherosclerotic heart disease of native coronary artery without angina pectoris: Secondary | ICD-10-CM | POA: Diagnosis not present

## 2022-12-19 DIAGNOSIS — F419 Anxiety disorder, unspecified: Secondary | ICD-10-CM | POA: Insufficient documentation

## 2022-12-19 DIAGNOSIS — J439 Emphysema, unspecified: Secondary | ICD-10-CM | POA: Diagnosis not present

## 2022-12-19 DIAGNOSIS — J4489 Other specified chronic obstructive pulmonary disease: Secondary | ICD-10-CM

## 2022-12-19 DIAGNOSIS — R918 Other nonspecific abnormal finding of lung field: Secondary | ICD-10-CM | POA: Diagnosis not present

## 2022-12-19 DIAGNOSIS — J418 Mixed simple and mucopurulent chronic bronchitis: Secondary | ICD-10-CM

## 2022-12-19 MED ORDER — BUSPIRONE HCL 7.5 MG PO TABS
7.5000 mg | ORAL_TABLET | Freq: Two times a day (BID) | ORAL | 0 refills | Status: DC
  Filled 2022-12-19: qty 60, 30d supply, fill #0
  Filled 2023-01-22 – 2023-01-23 (×2): qty 60, 30d supply, fill #1
  Filled 2023-02-06 – 2023-02-20 (×3): qty 60, 30d supply, fill #2

## 2022-12-19 NOTE — Assessment & Plan Note (Signed)
Worsening.  Plan: Discontinue hydroxyzine Trial buspirone 7.5 mg twice daily Follow-up 1 month

## 2022-12-19 NOTE — Patient Instructions (Signed)
-  Continue taking all medications except hydroxyzine.  -Discontinue hydroxyzine and start taking buspirone 7.5 mg twice a day for anxiety.  -Monitor your blood pressure at home and keep a log of the readings.  -Follow-up with your various specialists as scheduled, noting particularly your upcoming appointment on the 01/02/23 for the nodule on your face.  -Schedule a virtual follow-up visit in one month to evaluate how you are doing on buspirone.

## 2022-12-19 NOTE — Progress Notes (Signed)
Assessment/Plan:   Problem List Items Addressed This Visit       Cardiovascular and Mediastinum   CAD (coronary artery disease)   Chronic heart failure with preserved ejection fraction (HFpEF) (HCC)   Resistant hypertension     Respiratory   COPD with chronic bronchitis and emphysema (HCC)    Recent exacerbation appears improved.    Plan:  Continue current COPD medications and follow-up with pulmonology as well as further sleep study recommendations for OSA as recommended. Discontinue hydroxyzine as may be causing oversedation and possibly worsening apnea Monitor symptoms of dyspnea and fatigue      OSA (obstructive sleep apnea)   Lung nodule, multiple     Other   Anxiety    Worsening.  Plan: Discontinue hydroxyzine Trial buspirone 7.5 mg twice daily Follow-up 1 month      Relevant Medications   busPIRone (BUSPAR) 7.5 MG tablet   Other Visit Diagnoses     Mixed simple and mucopurulent chronic bronchitis (HCC)    -  Primary       Medications Discontinued During This Encounter  Medication Reason   hydrOXYzine (ATARAX) 25 MG tablet     Return in about 4 weeks (around 01/16/2023) for anxiety.    Subjective:   Encounter date: 12/19/2022  Kevin [name] is a 67 y.o. male who has Shortness of breath; PAF (paroxysmal atrial fibrillation) (HCC); Tobacco abuse; Acute on chronic renal insufficiency; COPD with chronic bronchitis and emphysema (HCC); Hypertensive emergency; Blurry vision, bilateral; Hypertensive cardiovascular disease; Essential hypertension; Acute on chronic diastolic CHF (congestive heart failure), NYHA class 3 (HCC); OSA (obstructive sleep apnea); Peripheral arterial disease (HCC); Stage 3 chronic kidney disease (HCC); Claudication in peripheral vascular disease (HCC); HIV (human immunodeficiency virus infection) (HCC); Easy bruising; CAD (coronary artery disease); Hypertensive urgency; Callus of foot; Dyspnea; Hypertensive crisis; Acute respiratory  failure (HCC); Papillary renal cell carcinoma (HCC); Right renal mass; Renal lesion; Acute diastolic heart failure (HCC); Chronic heart failure with preserved ejection fraction (HFpEF) (HCC); HLD (hyperlipidemia); Chest pain; Elevated troponin; Chronic low back pain; Lipoma; Lung nodule, multiple; Bloody stool; Resistant hypertension; Skin nodule; Amaurosis fugax; COPD with acute exacerbation (HCC); 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   Chief Complaint: Follow-up for ongoing shortness of breath, fatigue, and high blood pressure.  History of Present Illness:  Patient has a history of COPD and recently went to the emergency department on October 23 due to shortness of breath and fatigue.  They had nebulized albuterol/ipratropium treatment in the ED, which alleviated the symptoms temporarily.  Also treated with a course of doxycycline for COPD exacerbation.    The patient recently saw pulmonology/sleep medicine on October 31.  OSA along with COPD and chronic smoking are felt to be contributors to patient's worsening resistant hypertension, fatigue and shortness of breath.   It was recommended that patient have smoking cessation and assessed for the need for a new nebulizer and a better mask.  They report concern that one of their medication regimen  might be contributing to these symptoms. Patient  mentions that taking hydroxyzine results in lightheadedness, fatigue, worsening apnea.  They are taking hydroxyzine scheduled 3 times a day not as indicated back as needed usage.  The patient's blood pressure improved with most recent home reading 139/80.   Patient reports worsening anxiety and stress due to sisters current battle with metastatic lung cancer to the brain.   Review of Systems  Constitutional:  Positive for malaise/fatigue. Negative for weight loss.  Respiratory:  Positive for shortness of breath. Negative for wheezing.   Cardiovascular:  Positive for PND. Negative for chest pain, palpitations, orthopnea and leg swelling.  Neurological:  Positive for dizziness. Negative for loss of consciousness.  Psychiatric/Behavioral:  The patient is nervous/anxious.   All other systems reviewed and are negative.   Past Surgical History:  Procedure Laterality Date   IR RADIOLOGIST EVAL & MGMT  05/31/2021   IR RADIOLOGIST EVAL & MGMT  07/26/2021   LEFT HEART CATH AND CORONARY ANGIOGRAPHY N/A 10/23/2016   Procedure: LEFT HEART CATH AND CORONARY ANGIOGRAPHY;  Surgeon: Marykay Lex, MD;  Location: [facility] INVASIVE CV LAB;  Service: Cardiovascular;  Laterality: N/A;   LOWER EXTREMITY ANGIOGRAPHY N/A 07/17/2016   Procedure: Lower Extremity Angiography;  Surgeon: Runell Gess, MD;  Location: [facility] INVASIVE CV LAB;  Service: Cardiovascular;  Laterality: N/A;   LOWER EXTREMITY INTERVENTION N/A 06/05/2016   Procedure: Lower Extremity Intervention;  Surgeon: Runell Gess, MD;  Location: [facility] INVASIVE CV LAB;  Service: Cardiovascular;  Laterality: N/A;   PERIPHERAL VASCULAR ATHERECTOMY Right 07/17/2016   Procedure: Peripheral Vascular Atherectomy;  Surgeon: Runell Gess, MD;  Location: [facility] INVASIVE CV LAB;  Service: Cardiovascular;  Laterality: Right;  SFA   PERIPHERAL VASCULAR INTERVENTION  06/05/2016   Procedure: Peripheral Vascular Intervention;  Surgeon: Runell Gess, MD;  Location: [facility]  INVASIVE CV LAB;  Service: Cardiovascular;;  left SFA   RADIOLOGY WITH ANESTHESIA Right 06/29/2021   Procedure: RIGHT RENAL CRYOABLATION;  Surgeon: Richarda Overlie, MD;  Location: [facility] ORS;  Service: Anesthesiology;  Laterality: Right;    Outpatient Medications Prior to Visit  Medication Sig Dispense Refill   acetylcysteine (MUCOMYST) 10 % nebulizer solution Take 4 mLs by nebulization every 4 (four) hours. 30 mL 12   albuterol (VENTOLIN HFA) 108 (90 Base) MCG/ACT inhaler Inhale 1-2 puffs into the lungs every 6 (six) hours as needed for wheezing or shortness of breath. 18 g 0   albuterol (VENTOLIN HFA) 108 (90 Base) MCG/ACT inhaler Inhale 2 puffs into the lungs every 6 (six) hours as needed for wheezing or shortness of breath. 1 each 0   amLODipine-valsartan (EXFORGE) 10-320 MG tablet Take 1 tablet by mouth daily. 30 tablet 5   aspirin EC 81 MG tablet Take 1 tablet (81 mg total) by mouth daily, swallow whole 30 tablet 12   atorvastatin (LIPITOR) 80 MG tablet Take 1 tablet (80 mg total) by mouth daily. 30 tablet 3   dolutegravir-lamiVUDine (DOVATO) 50-300 MG tablet Take 1 tablet by mouth daily. 30 tablet 11   doxycycline (VIBRAMYCIN) 100 MG capsule Take 1 capsule (100 mg total) by mouth 2 (two) times daily. 20 capsule 0   Fluticasone-Umeclidin-Vilant (TRELEGY ELLIPTA) 200-62.5-25 MCG/ACT AEPB Inhale 1 puff into the lungs daily. 60 each 5   hydrALAZINE (APRESOLINE) 100 MG tablet Take 1 tablet (100 mg total) by mouth 3 (three) times daily. 90 tablet 5   ipratropium-albuterol (DUONEB) 0.5-2.5 (3) MG/3ML SOLN Take 3 mLs by nebulization every 6 (six)  hours as needed (wheezing or shortness of breath). 120 mL 1   montelukast (SINGULAIR) 10 MG tablet Take 1 tablet (10 mg total) by mouth at bedtime. 30 tablet 5   naloxone (NARCAN) nasal spray 4 mg/0.1 mL Call 911. Spray contents of 1 spray into one nostril. Repeat in 2-3 min if symptoms of opioid emergency persists; alternate nostrils 2 each 0   nebivolol  (BYSTOLIC) 5 MG tablet Take 1 tablet (5 mg total) by mouth daily. 30 tablet 6   hydrOXYzine (ATARAX) 25 MG tablet Take 25-50 mg by mouth. 3-4 times daily as needed for anxiety     No facility-administered medications prior to visit.    Family History  Problem Relation Age of Onset   High blood pressure Mother    Lupus Mother    Seizures Daughter    Heart attack Daughter     Social History   Socioeconomic History   Marital status: Significant Other    Spouse name: Not on file   Number of children: Not on file   Years of education: Not on file   Highest education level: Not on file  Occupational History   Not on file  Tobacco Use   Smoking status: Every Day    Current packs/day: 0.10    Average packs/day: 0.1 packs/day for 42.0 years (4.2 ttl pk-yrs)    Types: Cigarettes, E-cigarettes    Passive exposure: Never   Smokeless tobacco: Never   Tobacco comments:    1-2 cigarettes a day 09/29/22 [place]  Vaping Use   Vaping status: Every Day   Substances: Nicotine, Flavoring  Substance and Sexual Activity   Alcohol use: Not Currently    Alcohol/week: 2.0 standard drinks of alcohol    Types: 2 Cans of beer per week    Comment: rare   Drug use: Not Currently    Comment: rare   Sexual activity: Not Currently    Comment: declined condoms  Other Topics Concern   Not on file  Social History Narrative   Right handed   Social Determinants of Health   Financial Resource Strain: High Risk (10/05/2022)   Overall Financial Resource Strain (CARDIA)    Difficulty of Paying Living Expenses: Hard  Food Insecurity: No Food Insecurity (10/13/2022)   Hunger Vital Sign    Worried About Running Out of Food in the Last Year: Never true    Ran Out of Food in the Last Year: Never true  Transportation Needs: No Transportation Needs (10/13/2022)   PRAPARE - Administrator, Civil Service (Medical): No    Lack of Transportation (Non-Medical): No  Physical Activity: Inactive (10/05/2022)    Exercise Vital Sign    Days of Exercise per Week: 0 days    Minutes of Exercise per Session: 0 min  Stress: Stress Concern Present (05/04/2022)   [employer] of Occupational Health - Occupational Stress Questionnaire    Feeling of Stress : To some extent  Social Connections: Not on file  Intimate Partner Violence: Not At Risk (10/13/2022)   Humiliation, Afraid, Rape, and Kick questionnaire    Fear of Current or Ex-Partner: No    Emotionally Abused: No    Physically Abused: No    Sexually Abused: No  Objective:  Physical Exam: BP 139/80 Comment: home  Pulse 80   Temp (!) 97.2 F (36.2 C) (Temporal)   Wt 161 lb 9.6 oz (73.3 kg)   SpO2 99%   BMI 21.92 kg/m     Physical Exam Constitutional:      Appearance: Normal appearance.  HENT:     Head: Normocephalic and atraumatic.     Right Ear: Hearing normal.     Left Ear: Hearing normal.     Nose: Nose normal.  Eyes:     General: No scleral icterus.       Right eye: No discharge.        Left eye: No discharge.     Extraocular Movements: Extraocular movements intact.  Cardiovascular:     Rate and Rhythm: Normal rate and regular rhythm.     Heart sounds: Normal heart sounds.  Pulmonary:     Effort: Pulmonary effort is normal.     Breath sounds: Normal breath sounds.  Abdominal:     Palpations: Abdomen is soft.     Tenderness: There is no abdominal tenderness.  Skin:    General: Skin is warm.  Neurological:     General: No focal deficit present.     Mental Status: He is alert.     Cranial Nerves: No cranial nerve deficit.  Psychiatric:        Mood and Affect: Mood is anxious. Affect is tearful.        Behavior: Behavior is cooperative.     DG Chest Portable 1 View  Result Date: 12/06/2022 CLINICAL DATA:  Shortness of breath, increasing. History of COPD and CHF. EXAM: PORTABLE CHEST 1 VIEW COMPARISON:  11/18/2022  FINDINGS: Heart size and pulmonary vascularity are normal. Mediastinal contours appear intact. Emphysematous changes in the lungs. No airspace disease or consolidation. No pleural effusions. No pneumothorax. IMPRESSION: Emphysematous changes in the lungs. No evidence of active pulmonary disease. Electronically Signed   By: Burman Nieves M.D.   On: 12/06/2022 02:55   DG Chest Port 1 View  Result Date: 11/18/2022 CLINICAL DATA:  67 year old male with history of shortness of breath. EXAM: PORTABLE CHEST 1 VIEW COMPARISON:  Chest x-ray 10/12/2022. FINDINGS: Lung volumes are normal. No consolidative airspace disease. No pleural effusions. No pneumothorax. No pulmonary nodule or mass noted. Pulmonary vasculature and the cardiomediastinal silhouette are within normal limits. IMPRESSION: No radiographic evidence of acute cardiopulmonary disease. Electronically Signed   By: Trudie Reed M.D.   On: 11/18/2022 09:15   CT ANGIO HEAD NECK W WO CM  Result Date: 10/26/2022 CLINICAL DATA:  Provided history: Amaurosis fugax of left eye. Additional history provided: Recurrent monocular vision loss in left eye. EXAM: CT ANGIOGRAPHY HEAD AND NECK WITH AND WITHOUT CONTRAST TECHNIQUE: Multidetector CT imaging of the head and neck was performed using the standard protocol during bolus administration of intravenous contrast. Multiplanar CT image reconstructions and MIPs were obtained to evaluate the vascular anatomy. Carotid stenosis measurements (when applicable) are obtained utilizing NASCET criteria, using the distal internal carotid diameter as the denominator. RADIATION DOSE REDUCTION: This exam was performed according to the departmental dose-optimization program which includes automated exposure control, adjustment of the mA and/or kV according to patient size and/or use of iterative reconstruction technique. CONTRAST:  75mL ISOVUE-370 IOPAMIDOL (ISOVUE-370) INJECTION 76% COMPARISON:  Same-day brain MRI 10/25/2022.   Head CT 02/10/2017. FINDINGS: CT HEAD FINDINGS Brain: No age advanced or lobar predominant parenchymal atrophy. Moderate chronic small vessel ischemic changes within the cerebral white  matter and pons, overall better delineated on same day brain MRI. There is no acute intracranial hemorrhage. No demarcated cortical infarct. No extra-axial fluid collection. No evidence of an intracranial mass. No midline shift. Vascular: No hyperdense vessel. Skull: No calvarial fracture or aggressive osseous lesion. Sinuses/Orbits: No orbital mass or acute orbital finding. Other: Minimal mucosal thickening within the right maxillary sinus. Minimal mucosal thickening, and tiny mucous retention cysts, within the left maxillary sinus. Review of the MIP images confirms the above findings CTA NECK FINDINGS Aortic arch: Standard aortic branching. Mild soft plaque within the visualized thoracic aorta. Mild atherosclerotic plaque also present within the proximal major branch vessels of the neck. Streak/beam hardening artifact arising from a dense left-sided contrast bolus partially obscures the left subclavian artery. Within this limitation, there is no appreciable hemodynamically significant innominate or proximal subclavian artery stenosis. Right carotid system: CCA and ICA patent within the neck without measurable stenosis. Mild atherosclerotic plaque about the carotid bifurcation and within the proximal ICA. Tortuosity of the cervical ICA Left carotid system: CCA and ICA patent within the neck without measurable stenosis. Mild atherosclerotic plaque about the carotid bifurcation and within the proximal ICA. Vertebral arteries: Streak/beam hardening artifact at the level of the lower neck somewhat limits evaluation of the vertebral artery the right vertebral artery V1 segments. Within this limitation, the vertebral arteries are patent within the neck without stenosis or significant atherosclerotic disease. The right vertebral artery is  dominant. Skeleton: No acute fracture or aggressive osseous lesion. The patient is edentulous. Other neck: Streak/beam hardening artifact limits evaluation at the level of the lower neck. Within this limitation, there is no appreciable neck mass or cervical lymphadenopathy. Upper chest: No consolidation within the imaged lung apices. Review of the MIP images confirms the above findings CTA HEAD FINDINGS Anterior circulation: The intracranial internal carotid arteries are patent. Non-stenotic atherosclerotic plaque within both vessels. The M1 middle cerebral arteries are patent. No M2 proximal branch occlusion or high-grade proximal stenosis. The anterior cerebral arteries are patent. No intracranial aneurysm is identified. Posterior circulation: The intracranial vertebral arteries are patent. The basilar artery is patent. The posterior cerebral arteries are patent. Posterior communicating arteries are present bilaterally. Venous sinuses: Within the limitations of contrast timing, no convincing thrombus. Anatomic variants: None significant. Review of the MIP images confirms the above findings IMPRESSION: CT head: 1.  No evidence of an acute intracranial abnormality. 2. Chronic small vessel ischemic changes within the cerebral white matter and pons, overall moderate in severity and greater than expected for age. 3. Minor bilateral maxillary sinus disease. CTA neck: 1. The common carotid and internal carotid arteries are patent within the neck without stenosis. Mild atherosclerotic plaque about the carotid bifurcations and within the proximal internal carotid arteries, bilaterally. 2. Streak/beam hardening artifact at the level of the lower neck somewhat limits evaluation of the vertebral artery V1 segments. Within this limitation, the vertebral arteries are patent within the neck without stenosis or significant atherosclerotic disease. 3. Aortic Atherosclerosis (ICD10-I70.0) and Emphysema (ICD10-J43.9). CTA head: 1.  No intracranial large vessel occlusion or proximal high-grade arterial stenosis. 2. Non-stenotic atherosclerotic plaque within the intracranial internal carotid arteries. Electronically Signed   By: Jackey Loge D.O.   On: 10/26/2022 09:00   MR BRAIN WO CONTRAST  Result Date: 10/26/2022 CLINICAL DATA:  Provided history: Amaurosis fugax of left eye. EXAM: MRI HEAD WITHOUT CONTRAST TECHNIQUE: Multiplanar, multiecho pulse sequences of the brain and surrounding structures were obtained without intravenous contrast. COMPARISON:  Same day  non-contrast head CT and CT angiogram head/neck 10/25/2022. Head CT 02/10/2017. FINDINGS: Brain: No age advanced or lobar predominant parenchymal atrophy. Multifocal T2 FLAIR hyperintense signal abnormality within the cerebral white matter and pons, overall moderate in severity and greater than expected for age. These signal changes are nonspecific, but compatible with chronic small vessel ischemic disease. No cortical encephalomalacia is identified. There is no acute infarct. No evidence of an intracranial mass. No chronic intracranial blood products. No extra-axial fluid collection. No midline shift. Vascular: Maintained flow voids within the proximal large arterial vessels. Skull and upper cervical spine: No focal suspicious marrow lesion. Sinuses/Orbits: No mass or acute finding within the imaged orbits. No significant paranasal sinus disease. Other: Nonspecific 14 mm ovoid T1 hypointense and T2 FLAIR hyperintense cutaneous/subcutaneous lesion within the left pterional scalp. IMPRESSION: 1. No evidence of an acute intracranial abnormality. 2. Chronic small vessel ischemic changes within the cerebral white matter and pons, overall moderate in severity and greater than expected for age. 3. Nonspecific 14 mm cutaneous/subcutaneous lesion within the left pterional scalp. Direct examination recommended. Electronically Signed   By: Jackey Loge D.O.   On: 10/26/2022 08:43   DG Chest  Port 1 View  Result Date: 10/12/2022 CLINICAL DATA:  Shortness of breath.  History of COPD and CHF. EXAM: PORTABLE CHEST 1 VIEW COMPARISON:  Chest radiograph dated 09/11/2022 and CT dated 09/06/2022. FINDINGS: No focal consolidation, pleural effusion, or pneumothorax. The cardiac silhouette is within normal limits. No acute osseous pathology. IMPRESSION: No active disease. Electronically Signed   By: Elgie Collard M.D.   On: 10/12/2022 23:26   CARDIAC EVENT MONITOR  Result Date: 10/02/2022 Sinus rhythm with 4 brief episodes of NSVT - PVC's with burden of 1% noted. No afib. Chrystie Nose, MD, [facility], FACP [place]  [facility] Medical Director of the [facility] Lipid Disorders [facility] Cardiovascular Risk Reduction [facility] Diplomate of the American Board of Clinical Lipidology Attending Cardiologist Direct Dial: [phone]  Fax: [phone] Website:  [email or web address]  MYOCARDIAL PERFUSION IMAGING  Result Date: 09/26/2022   Normal perfusion. LVEF mildly reduced 43%. Recent echo noted to be normal. Suspect the value on this study is artifact.   LV perfusion is normal. There is no evidence of ischemia. There is no evidence of infarction.   Left ventricular function is abnormal. Nuclear stress EF: 43%. The left ventricular ejection fraction is moderately decreased (30-44%). End diastolic cavity size is mildly enlarged.   The study is normal. The study is low risk.   NM PET Image Initial (PI) Skull Base To Thigh (F-18 FDG)  Result Date: 09/26/2022 CLINICAL DATA:  Initial treatment strategy for right upper and right lower lobe enlarging pulmonary nodules on CTs. Prior right renal cryoablation. EXAM: NUCLEAR MEDICINE PET SKULL BASE TO THIGH TECHNIQUE: 7.7 mCi F-18 FDG was injected intravenously. Full-ring PET imaging was performed from the skull base to thigh after the radiotracer. CT data was obtained and used for attenuation correction and anatomic localization. Fasting blood glucose: 69 mg/dl  COMPARISON:  Chest CTs, most recent 09/06/2022. Abdominal MRI 01/17/2022. FINDINGS: Mediastinal blood pool activity: SUV max 1.6 Liver activity: SUV max NA NECK: No areas of abnormal hypermetabolism. Incidental CT findings: No cervical adenopathy. CHEST: Mild hypermetabolism within mediastinal and right hilar nodes. Example right paratracheal node of 7 mm and a S.U.V. max of 4.1 on 66/4. Right hilar activity without well-defined adenopathy at a S.U.V. max of 4.0. The enlarging right upper lobe peribronchovascular nodule measures 5 mm and is not hypermetabolic,  but below PET resolution on 56/4. None of the ground-glass nodules have PET correlate. A focus of hypermetabolism within the anterior right apex is without well-defined correlate nodule. Minimal scarring in this area, measuring a S.U.V. max of 5.5 on 48/4. Hypermetabolism within the inferior lingula, corresponding to minimal airspace disease at a S.U.V. max of 7.2 on 99/4. This is new since the prior CT. Incidental CT findings: Mild centrilobular emphysema. ABDOMEN/PELVIS: No abdominopelvic parenchymal or nodal hypermetabolism. Incidental CT findings: Normal adrenal glands. Scarring in the right kidney is likely secondary to prior ablation. Abdominal aortic atherosclerosis. SKELETON: No abnormal marrow activity. Incidental CT findings: Femoral head avascular necrosis bilaterally. Right iliac sclerotic lesion is similar to 07/14/2021 at 11 mm favored to represent a bone island. IMPRESSION: 1. Enlarging solid and subsolid pulmonary nodules on prior CT are without PET correlate, not unexpected given small size and morphology respectively. Recommend surveillance with chest CT at 3-6 months. 2. Hypermetabolism corresponding to new minimal inferior lingular airspace disease since 07/14/2021, suspicious for interval infection. 3. Focus of hypermetabolism in the anterior right apex, without correlate pulmonary nodule. Indeterminate. This could be secondary to a second  focus of relatively occult infection, or tracer embolization. Recommend special attention to this area on follow-up chest CT. 4. Mildly hypermetabolic thoracic nodes are likely reactive. 5.  Aortic Atherosclerosis (ICD10-I70.0). 6. Femoral avascular necrosis bilaterally. Electronically Signed   By: Jeronimo Greaves M.D.   On: 09/26/2022 09:03    Recent Results (from the past 2160 hour(s))  Glucose, capillary     Status: Abnormal   Collection Time: 09/21/22  6:49 AM  Result Value Ref Range   Glucose-Capillary 69 (L) 70 - 99 mg/dL    Comment: Glucose reference range applies only to samples taken after fasting for at least 8 hours.  MYOCARDIAL PERFUSION IMAGING     Status: None   Collection Time: 09/26/22 12:23 PM  Result Value Ref Range   Rest Nuclear Isotope Dose 10.9 mCi   Stress Nuclear Isotope Dose 30.4 mCi   Rest HR 54.0 bpm   Rest BP 210/100 mmHg   Peak HR 100 bpm   Peak BP 194/96 mmHg   SSS 0.0    SRS 0.0    SDS 0.0    TID 0.97    LV sys vol 101.0 mL   LV dias vol 176.0 62 - 150 mL   Nuc Stress EF 43 %   ST Depression (mm) 0 mm  CBC with Differential     Status: Abnormal   Collection Time: 10/12/22 10:55 PM  Result Value Ref Range   WBC 11.0 (H) 4.0 - 10.5 K/uL   RBC 3.48 (L) 4.22 - 5.81 MIL/uL   Hemoglobin 11.1 (L) 13.0 - 17.0 g/dL   HCT 91.4 (L) 78.2 - 95.6 %   MCV 94.3 80.0 - 100.0 fL   MCH 31.9 26.0 - 34.0 pg   MCHC 33.8 30.0 - 36.0 g/dL   RDW 21.3 08.6 - 57.8 %   Platelets 331 150 - 400 K/uL   nRBC 0.0 0.0 - 0.2 %   Neutrophils Relative % 60 %   Neutro Abs 6.8 1.7 - 7.7 K/uL   Lymphocytes Relative 23 %   Lymphs Abs 2.5 0.7 - 4.0 K/uL   Monocytes Relative 7 %   Monocytes Absolute 0.8 0.1 - 1.0 K/uL   Eosinophils Relative 8 %   Eosinophils Absolute 0.8 (H) 0.0 - 0.5 K/uL   Basophils Relative 1 %   Basophils Absolute 0.1  0.0 - 0.1 K/uL   Immature Granulocytes 1 %   Abs Immature Granulocytes 0.05 0.00 - 0.07 K/uL    Comment: Performed at [facility],  [address] [place], [place] [address]  Basic metabolic panel     Status: Abnormal   Collection Time: 10/12/22 10:55 PM  Result Value Ref Range   Sodium 140 135 - 145 mmol/L   Potassium 4.0 3.5 - 5.1 mmol/L   Chloride 107 98 - 111 mmol/L   CO2 20 (L) 22 - 32 mmol/L   Glucose, Bld 89 70 - 99 mg/dL    Comment: Glucose reference range applies only to samples taken after fasting for at least 8 hours.   BUN 23 8 - 23 mg/dL   Creatinine, Ser 1.91 (H) 0.61 - 1.24 mg/dL   Calcium 9.4 8.9 - 47.8 mg/dL   GFR, Estimated 42 (L) >60 mL/min    Comment: (NOTE) Calculated using the CKD-EPI Creatinine Equation (2021)    Anion gap 13 5 - 15    Comment: Performed at [facility], [address] [place], [place] [address]  Resp panel by RT-PCR (RSV, Flu A&B, Covid) Anterior Nasal Swab     Status: None   Collection Time: 10/12/22 10:55 PM   Specimen: Anterior Nasal Swab  Result Value Ref Range   SARS Coronavirus 2 by RT PCR NEGATIVE NEGATIVE   Influenza A by PCR NEGATIVE NEGATIVE   Influenza B by PCR NEGATIVE NEGATIVE    Comment: (NOTE) The Xpert Xpress SARS-CoV-2/FLU/RSV plus assay is intended as an aid in the diagnosis of influenza from Nasopharyngeal swab specimens and should not be used as a sole basis for treatment. Nasal washings and aspirates are unacceptable for Xpert Xpress SARS-CoV-2/FLU/RSV testing.  Fact Sheet for Patients: BloggerCourse.com  Fact Sheet for Healthcare Providers: SeriousBroker.it  This test is not yet approved or cleared by the [place] FDA and has been authorized for detection and/or diagnosis of SARS-CoV-2 by FDA under an Emergency Use Authorization (EUA). This EUA will remain in effect (meaning this test can be used) for the duration of the COVID-19 declaration under Section 564(b)(1) of the Act, 21 U.S.C. section 360bbb-3(b)(1), unless the authorization is terminated or revoked.     Resp Syncytial  Virus by PCR NEGATIVE NEGATIVE    Comment: (NOTE) Fact Sheet for Patients: BloggerCourse.com  Fact Sheet for Healthcare Providers: SeriousBroker.it  This test is not yet approved or cleared by the [place] FDA and has been authorized for detection and/or diagnosis of SARS-CoV-2 by FDA under an Emergency Use Authorization (EUA). This EUA will remain in effect (meaning this test can be used) for the duration of the COVID-19 declaration under Section 564(b)(1) of the Act, 21 U.S.C. section 360bbb-3(b)(1), unless the authorization is terminated or revoked.  Performed at [facility], [address], [place], [place] [address]   Troponin I (High Sensitivity)     Status: Abnormal   Collection Time: 10/13/22 12:28 AM  Result Value Ref Range   Troponin I (High Sensitivity) 43 (H) <18 ng/L    Comment: (NOTE) Elevated high sensitivity troponin I (hsTnI) values and significant  changes across serial measurements may suggest ACS but many other  chronic and acute conditions are known to elevate hsTnI results.  Refer to the "Links" section for chest pain algorithms and additional  guidance. Performed at [facility], [address] [place], [place] [address]   Troponin I (High Sensitivity)  Status: Abnormal   Collection Time: 10/13/22  2:33 AM  Result Value Ref Range   Troponin I (High Sensitivity) 42 (H) <18 ng/L    Comment: (NOTE) Elevated high sensitivity troponin I (hsTnI) values and significant  changes across serial measurements may suggest ACS but many other  chronic and acute conditions are known to elevate hsTnI results.  Refer to the "Links" section for chest pain algorithms and additional  guidance. Performed at [facility], [address], [place], [place] [address]   Hepatic function panel     Status: None   Collection Time: 10/24/22  2:32 PM  Result Value Ref Range   Total Protein 6.8 6.0 -  8.5 g/dL   Albumin 4.3 3.9 - 4.9 g/dL   Bilirubin Total 0.3 0.0 - 1.2 mg/dL   Bilirubin, Direct 7.25 0.00 - 0.40 mg/dL   Alkaline Phosphatase 74 44 - 121 IU/L   AST 14 0 - 40 IU/L   ALT 8 0 - 44 IU/L  Lipid panel     Status: None   Collection Time: 10/24/22  2:32 PM  Result Value Ref Range   Cholesterol, Total 190 100 - 199 mg/dL   Triglycerides 79 0 - 149 mg/dL   HDL 79 >36 mg/dL   VLDL Cholesterol Cal 14 5 - 40 mg/dL   LDL Chol Calc (NIH) 97 0 - 99 mg/dL   Chol/HDL Ratio 2.4 0.0 - 5.0 ratio    Comment:                                   T. Chol/HDL Ratio                                             Men  Women                               1/2 Avg.Risk  3.4    3.3                                   Avg.Risk  5.0    4.4                                2X Avg.Risk  9.6    7.1                                3X Avg.Risk 23.4   11.0   Basic metabolic panel     Status: Abnormal   Collection Time: 11/18/22  8:24 AM  Result Value Ref Range   Sodium 141 135 - 145 mmol/L   Potassium 4.2 3.5 - 5.1 mmol/L   Chloride 113 (H) 98 - 111 mmol/L   CO2 20 (L) 22 - 32 mmol/L   Glucose, Bld 76 70 - 99 mg/dL    Comment: Glucose reference range applies only to samples taken after fasting for at least 8 hours.   BUN 14 8 - 23 mg/dL   Creatinine, Ser 6.44 (H) 0.61 - 1.24 mg/dL   Calcium 8.7 (L)  8.9 - 10.3 mg/dL   GFR, Estimated 41 (L) >60 mL/min    Comment: (NOTE) Calculated using the CKD-EPI Creatinine Equation (2021)    Anion gap 8 5 - 15    Comment: Performed at [facility], [address] [place], [place] [address]  CBC with Differential     Status: Abnormal   Collection Time: 11/18/22  8:24 AM  Result Value Ref Range   WBC 8.2 4.0 - 10.5 K/uL   RBC 3.37 (L) 4.22 - 5.81 MIL/uL   Hemoglobin 11.0 (L) 13.0 - 17.0 g/dL   HCT 19.1 (L) 47.8 - 29.5 %   MCV 96.1 80.0 - 100.0 fL   MCH 32.6 26.0 - 34.0 pg   MCHC 34.0 30.0 - 36.0 g/dL   RDW 62.1 30.8 - 65.7 %   Platelets 314 150 - 400 K/uL    nRBC 0.0 0.0 - 0.2 %   Neutrophils Relative % 54 %   Neutro Abs 4.5 1.7 - 7.7 K/uL   Lymphocytes Relative 29 %   Lymphs Abs 2.4 0.7 - 4.0 K/uL   Monocytes Relative 8 %   Monocytes Absolute 0.6 0.1 - 1.0 K/uL   Eosinophils Relative 8 %   Eosinophils Absolute 0.6 (H) 0.0 - 0.5 K/uL   Basophils Relative 1 %   Basophils Absolute 0.1 0.0 - 0.1 K/uL   Immature Granulocytes 0 %   Abs Immature Granulocytes 0.03 0.00 - 0.07 K/uL    Comment: Performed at [facility], [address], [place], [place] [address]  Brain natriuretic peptide     Status: None   Collection Time: 11/18/22  8:31 AM  Result Value Ref Range   B Natriuretic Peptide 97.5 0.0 - 100.0 pg/mL    Comment: Performed at [facility], [address] [place], [place] [address]  I-Stat venous blood gas, [facility], [facility], [facility])     Status: Abnormal   Collection Time: 11/18/22  8:39 AM  Result Value Ref Range   pH, Ven 7.446 (H) 7.25 - 7.43   pCO2, Ven 28.5 (L) 44 - 60 mmHg   pO2, Ven 98 (H) 32 - 45 mmHg   Bicarbonate 19.6 (L) 20.0 - 28.0 mmol/L   TCO2 20 (L) 22 - 32 mmol/L   O2 Saturation 98 %   Acid-base deficit 3.0 (H) 0.0 - 2.0 mmol/L   Sodium 142 135 - 145 mmol/L   Potassium 4.2 3.5 - 5.1 mmol/L   Calcium, Ion 1.18 1.15 - 1.40 mmol/L   HCT 33.0 (L) 39.0 - 52.0 %   Hemoglobin 11.2 (L) 13.0 - 17.0 g/dL   Sample type VENOUS   SARS Coronavirus 2 by RT PCR (hospital order, performed in [facility] hospital lab) *cepheid single result test* Anterior Nasal Swab     Status: None   Collection Time: 11/18/22 12:47 PM   Specimen: Anterior Nasal Swab  Result Value Ref Range   SARS Coronavirus 2 by RT PCR NEGATIVE NEGATIVE    Comment: Performed at [facility], [address], [place], [place] [address]  Pulmonary function test     Status: None   Collection Time: 11/24/22  3:51 PM  Result Value Ref Range   FVC-Pre 3.52 L   FVC-%Pred-Pre 70 %   FVC-Post 3.24 L   FVC-%Pred-Post 64 %   FVC-%Change-Post -7 %    FEV1-Pre 1.59 L   FEV1-%Pred-Pre 42 %   FEV1-Post 1.70 L   FEV1-%Pred-Post 45 %  FEV1-%Change-Post 6 %   FEV6-Pre 3.43 L   FEV6-%Pred-Pre 72 %   FEV6-Post 3.19 L   FEV6-%Pred-Post 67 %   FEV6-%Change-Post -6 %   Pre FEV1/FVC ratio 45 %   FEV1FVC-%Pred-Pre 60 %   Post FEV1/FVC ratio 53 %   FEV1FVC-%Change-Post 15 %   Pre FEV6/FVC Ratio 98 %   FEV6FVC-%Pred-Pre 102 %   Post FEV6/FVC ratio 99 %   FEV6FVC-%Pred-Post 103 %   FEV6FVC-%Change-Post 1 %   FEF 25-75 Pre 0.89 L/sec   FEF2575-%Pred-Pre 30 %   FEF 25-75 Post 0.50 L/sec   FEF2575-%Pred-Post 16 %   FEF2575-%Change-Post -43 %   RV 4.47 L   RV % pred 183 %   TLC 8.39 L   TLC % pred 113 %   DLCO unc 19.08 ml/min/mmHg   DLCO unc % pred 66 %   DLCO cor 21.46 ml/min/mmHg   DLCO cor % pred 74 %   DL/VA 1.61 ml/min/mmHg/L   DL/VA % pred 84 %  CBC with Differential     Status: Abnormal   Collection Time: 12/06/22  1:40 AM  Result Value Ref Range   WBC 13.9 (H) 4.0 - 10.5 K/uL   RBC 2.95 (L) 4.22 - 5.81 MIL/uL   Hemoglobin 9.6 (L) 13.0 - 17.0 g/dL   HCT 09.6 (L) 04.5 - 40.9 %   MCV 96.3 80.0 - 100.0 fL   MCH 32.5 26.0 - 34.0 pg   MCHC 33.8 30.0 - 36.0 g/dL   RDW 81.1 91.4 - 78.2 %   Platelets 231 150 - 400 K/uL   nRBC 0.0 0.0 - 0.2 %   Neutrophils Relative % 70 %   Neutro Abs 9.8 (H) 1.7 - 7.7 K/uL   Lymphocytes Relative 15 %   Lymphs Abs 2.1 0.7 - 4.0 K/uL   Monocytes Relative 7 %   Monocytes Absolute 0.9 0.1 - 1.0 K/uL   Eosinophils Relative 8 %   Eosinophils Absolute 1.0 (H) 0.0 - 0.5 K/uL   Basophils Relative 0 %   Basophils Absolute 0.1 0.0 - 0.1 K/uL   Immature Granulocytes 0 %   Abs Immature Granulocytes 0.03 0.00 - 0.07 K/uL    Comment: Performed at [facility], [address] [place], [place] [address]  Basic metabolic panel     Status: Abnormal   Collection Time: 12/06/22  1:40 AM  Result Value Ref Range   Sodium 140 135 - 145 mmol/L   Potassium 4.2 3.5 - 5.1 mmol/L   Chloride  115 (H) 98 - 111 mmol/L   CO2 20 (L) 22 - 32 mmol/L   Glucose, Bld 91 70 - 99 mg/dL    Comment: Glucose reference range applies only to samples taken after fasting for at least 8 hours.   BUN 19 8 - 23 mg/dL   Creatinine, Ser 3.08 (H) 0.61 - 1.24 mg/dL   Calcium 8.0 (L) 8.9 - 10.3 mg/dL   GFR, Estimated 33 (L) >60 mL/min    Comment: (NOTE) Calculated using the CKD-EPI Creatinine Equation (2021)    Anion gap 5 5 - 15    Comment: Performed at [facility], [address] [place], [place] [address]        Garner Nash, MD, MS

## 2022-12-19 NOTE — Assessment & Plan Note (Addendum)
Recent exacerbation appears improved.    Plan:  Continue current COPD medications and follow-up with pulmonology as well as further sleep study recommendations for OSA as recommended. Discontinue hydroxyzine as may be causing oversedation and possibly worsening apnea Monitor symptoms of dyspnea and fatigue

## 2022-12-20 ENCOUNTER — Other Ambulatory Visit: Payer: Self-pay | Admitting: *Deleted

## 2022-12-20 NOTE — Patient Outreach (Signed)
Medicaid Managed Care   Nurse Care Manager Note  12/20/2022 Name:  Kevin [name] MRN:  [id number] DOB:  [birth year]  Kevin [name] is an 67 y.o. year old male who is a primary patient of [name], MD.  The [facility] Managed Care Coordination team was consulted for assistance with:    HTN COPD Smoking Cessation  Kevin [name] was given information about Medicaid Managed Care Coordination team services today. [name] Patient agreed to services and verbal consent obtained.  Engaged with patient by telephone for follow up visit in response to provider referral for case management and/or care coordination services.   Assessments/Interventions:  Review of past medical history, allergies, medications, health status, including review of consultants reports, laboratory and other test data, was performed as part of comprehensive evaluation and provision of chronic care management services.  SDOH (Social Determinants of Health) assessments and interventions performed: SDOH Interventions    Flowsheet Row Office Visit from 08/08/2022 in [facility] Patient Outreach Telephone from 08/07/2022 in [place] POPULATION HEALTH DEPARTMENT Patient Outreach Telephone from 07/04/2022 in [place] POPULATION HEALTH DEPARTMENT Patient Outreach Telephone from 06/02/2022 in [place] POPULATION HEALTH DEPARTMENT Patient Outreach Telephone from 05/04/2022 in [place] POPULATION HEALTH DEPARTMENT Patient Outreach Telephone from 05/03/2022 in [place] POPULATION HEALTH DEPARTMENT  SDOH Interventions        Food Insecurity Interventions -- Intervention Not Indicated -- -- -- Intervention Not Indicated  Housing Interventions -- Intervention Not Indicated -- Intervention Not Indicated -- --  Transportation Interventions -- -- Payor Benefit  [Provided with [phone]] Intervention Not Indicated -- Payor Benefit  [employer]  [phone]  Utilities Interventions -- Other (Comment)  [Advised patient to contact [employer] for increased bill and inquire about financial assistance programs] -- -- -- --  Depression Interventions/Treatment  Patient refuses Treatment -- -- -- -- --  Stress Interventions -- -- -- -- [employer], Provide Counseling --       Care Plan  No Known Allergies  Medications Reviewed Today     Reviewed by [name], RN (Registered Nurse) on 12/20/22 at 1140  Med List Status: <None>   Medication Order Taking? Sig Documenting Provider Last Dose Status Informant  acetylcysteine (MUCOMYST) 10 % nebulizer solution [id number] Yes Take 4 mLs by nebulization every 4 (four) hours. [name], MD Taking Active   albuterol (VENTOLIN HFA) 108 [id number] Base) MCG/ACT inhaler [id number] Yes Inhale 1-2 puffs into the lungs every 6 (six) hours as needed for wheezing or shortness of breath. Cobb, Ruby Cola, NP Taking Active   albuterol (VENTOLIN HFA) 108 (90 Base) MCG/ACT inhaler [id number]  Inhale 2 puffs into the lungs every 6 (six) hours as needed for wheezing or shortness of breath. Horton, Mayer Masker, MD  Active   amLODipine-valsartan (EXFORGE) 10-320 MG tablet [id number] Yes Take 1 tablet by mouth daily. Corrin Parker, PA-C Taking Active   aspirin EC 81 MG tablet [id number] Yes Take 1 tablet (81 mg total) by mouth daily, swallow whole [name], MD Taking Active            Med Note Littie Deeds, [name]   Tue Dec 05, 2022  3:43 PM)    atorvastatin (LIPITOR) 80 MG tablet [id number] Yes Take 1 tablet (80 mg total) by mouth daily.  Taking Active            Med Note (GENTRY, [name]   Tue  Dec 05, 2022  3:43 PM)    busPIRone (BUSPAR) 7.5 MG tablet [id number] No Take 1 tablet (7.5 mg total) by mouth 2 (two) times daily.  Patient not taking: Reported on 12/20/2022   [name], MD Not Taking Active   dolutegravir-lamiVUDine (DOVATO) 50-300 MG tablet [id number] Yes  Take 1 tablet by mouth daily. [name], [name], MD Taking Active Self  doxycycline (VIBRAMYCIN) 100 MG capsule [id number] No Take 1 capsule (100 mg total) by mouth 2 (two) times daily.  Patient not taking: Reported on 12/20/2022   Horton, Mayer Masker, MD Not Taking Active   Fluticasone-Umeclidin-[facility] ELLIPTA) 200-62.5-25 MCG/ACT AEPB [id number] Yes Inhale 1 puff into the lungs daily.  Taking Active            Med Note Littie Deeds, [name]   Tue Dec 05, 2022  3:42 PM)    hydrALAZINE (APRESOLINE) 100 MG tablet [id number] Yes Take 1 tablet (100 mg total) by mouth 3 (three) times daily. Rai, Delene Ruffini, MD Taking Active   ipratropium-albuterol (DUONEB) 0.5-2.5 (3) MG/3ML SOLN [id number] Yes Take 3 mLs by nebulization every 6 (six) hours as needed (wheezing or shortness of breath). Gwyneth Sprout, MD Taking Active Self           Med Note Littie Deeds, [name]   Wed Nov 15, 2022 10:56 AM) Uses acetylcysteine more frequently  montelukast (SINGULAIR) 10 MG tablet [id number] Yes Take 1 tablet (10 mg total) by mouth at bedtime.  Taking Active   naloxone (NARCAN) nasal spray 4 mg/0.1 mL [id number] Yes Call 911. Spray contents of 1 spray into one nostril. Repeat in 2-3 min if symptoms of opioid emergency persists; alternate nostrils  Taking Active   nebivolol (BYSTOLIC) 5 MG tablet [id number] Yes Take 1 tablet (5 mg total) by mouth daily.  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   Skin nodule 08/08/2022   Lung nodule, multiple 07/26/2022   Bloody stool 07/26/2022   Lipoma 07/06/2022   Chronic low back pain 04/29/2022   Chronic heart failure with preserved ejection fraction (HFpEF) (HCC)  12/27/2021   HLD (hyperlipidemia) 12/27/2021   Chest pain 12/27/2021   Elevated troponin 12/27/2021   Acute diastolic heart failure (HCC) 07/14/2021   Right renal mass 06/29/2021   Renal lesion 06/29/2021   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   Hypertensive crisis    Acute respiratory failure (HCC)    Dyspnea 04/04/2019   Callus of foot 07/10/2018   Hypertensive urgency 08/15/2017   CAD (coronary artery disease) 10/19/2016   Easy bruising 08/11/2016   Claudication in peripheral vascular disease (HCC) 06/05/2016   Stage 3 chronic kidney disease (HCC)    Peripheral arterial disease (HCC) 05/09/2016   Acute on chronic diastolic CHF (congestive heart failure), NYHA class 3 (HCC) 03/24/2016   OSA (obstructive sleep apnea) 03/24/2016   Essential hypertension 02/18/2016   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   Shortness of breath 02/07/2016   PAF (paroxysmal atrial fibrillation) (HCC) 02/07/2016   Tobacco abuse 02/07/2016   Acute on chronic renal  insufficiency 02/07/2016   COPD with chronic bronchitis and emphysema (HCC) 02/07/2016   Hypertensive emergency 02/07/2016   Blurry vision, bilateral    HIV (human immunodeficiency virus infection) (HCC) 08/27/2004    Conditions to be addressed/monitored per PCP order:  HTN, COPD, and Tobacco Use  Care Plan : RN Care Manager Plan of Care  Updates made by [name], RN since 12/20/2022 12:00 AM     Problem: Health Management needs related to COPD      Long-Range Goal: Development of Plan of Care to address Health Management needs related to COPD   Start Date: 05/03/2022  Expected End Date: 02/13/2023  Note:   Current Barriers:  Chronic Disease Management support and education needs related to HTN and COPD-Patient unsure of all of his medications. He is receiving 3 medications in a bubble pack, but unsure of where they are coming from. Reports feeling better and improved BP.  RNCM Clinical Goal(s):  Patient  will verbalize understanding of plan for management of HTN and COPD as evidenced by patient reports take all medications exactly as prescribed and will call provider for medication related questions as evidenced by patient reports    attend all scheduled medical appointments: 01/01/23 with [facility], 01/02/23 with Dermatology, Colonoscopy on 01/26/23, 12/26 with HTN Clinic,  as evidenced by provider documentation        through collaboration with RN Care manager, provider, and care team.   Interventions: Inter-disciplinary care team collaboration (see longitudinal plan of care) Evaluation of current treatment plan related to  self management and patient's adherence to plan as established by provider Provided therapeutic listening Discussed Opthalmology referral-patient scheduled with [facility] [phone] on 01/01/23 at 10am-called to verify appointment   Hypertension Interventions:  (Status:  Goal on track:  Yes.) Long Term Goal-Patient reports BP at home 140-150/80s Last practice recorded BP readings:  BP Readings from Last 3 Encounters:  12/19/22 139/80  12/14/22 (!) 203/117  12/06/22 (!) 166/112   Most recent eGFR/CrCl:  Lab Results  Component Value Date   EGFR 36 (L) 07/06/2022    No components found for: "CRCL"  Evaluation of current treatment plan related to hypertension self management and patient's adherence to plan as established by provider Reviewed medications with patient and discussed importance of compliance Discussed complications of poorly controlled blood pressure such as heart disease, stroke, circulatory complications, vision complications, kidney impairment, sexual dysfunction Assessed social determinant of health barriers Advised patient to take all medications to all provider appointments Discussed the importance of smoking cessation, improving diet and exercise in controlling HTN Assisted with scheduling with Nephrology per referral with [facility]  [phone], 02/28/23 at 10am Explained the importance of taking medications at the same time each day and checking BP 1-2 hours after taking medications Reviewed follow up with HTN clinic on 02/08/23    Smoking Cessation Interventions:  (Status:  Goal on track:  Yes.) Long Term Goal currently smoking 3-5 cigarettes/day, patient continues to desire cessation Reviewed smoking history:  tobacco abuse of >20 years; currently smoking 1-2 ppd Previous quit attempts, unsuccessful several /successful using none  Reports smoking within 30 minutes of waking up Reports triggers to smoke include: stress Reports motivation to quit smoking includes: family and health On a scale of 1-10, reports MOTIVATION to quit is 10 On a scale of 1-10, reports CONFIDENCE in quitting is 10  Evaluation of current treatment plan reviewed Provided patient with printed smoking cessation educational materials Provided contact information for [place]  [employer] developer) Assessed social determinant of health barriers   COPD: (Status: Goal on Track (progressing): YES.) Long Term Goal  Reviewed medications with patient Provided instruction about proper use of medications used for management of COPD including inhalers Advised patient to self assesses COPD action plan zone and make appointment with provider if in the yellow zone for 48 hours without improvement Advised patient to engage in light exercise as tolerated 3-5 days a week to aid in the the management of COPD Provided education about and advised patient to utilize infection prevention strategies to reduce risk of respiratory infection Discussed the importance of adequate rest and management of fatigue with COPD Assessed social determinant of health barriers Reviewed sleep study evaluation and discussed with patient-waiting to be scheduled for new mask fitting Advised patient to continue to work on smoking cessation  Patient Goals/Self-Care Activities: Take  medications as prescribed   Attend all scheduled provider appointments Call pharmacy for medication refills 3-7 days in advance of running out of medications Call provider office for new concerns or questions  Work with the social worker to address care coordination needs and will continue to work with the clinical team to address health care and disease management related needs identify and avoid work-related triggers develop a rescue plan develop a new routine to improve sleep get at least 7 to 8 hours of sleep at night       Follow Up:  Patient agrees to Care Plan and Follow-up.  Plan: The Managed Medicaid care management team will reach out to the patient again over the next 30 days.  Date/time of next scheduled RN care management/care coordination outreach:  01/22/23  [name] RN, BSN [place]  [facility] RN Care Coordinator [phone]

## 2022-12-20 NOTE — Patient Instructions (Signed)
Visit Information  [name] was given information about Medicaid Managed Care team care coordination services as a part of their Healthy [facility] Medicaid benefit. [name] verbally consented to engagement with the [facility] Managed Care team.   If you are experiencing a medical emergency, please call 911 or report to your local emergency department or urgent care.   If you have a non-emergency medical problem during routine business hours, please contact your provider's office and ask to speak with a nurse.   For questions related to your Healthy [facility] health plan, please call: [phone] or visit the homepage here: [email or web address]  If you would like to schedule transportation through your Healthy [facility] plan, please call the following number at least 2 days in advance of your appointment: [phone]  For information about your ride after you set it up, call Ride Assist at [phone]. Use this number to activate a Will Call pickup, or if your transportation is late for a scheduled pickup. Use this number, too, if you need to make a change or cancel a previously scheduled reservation.  If you need transportation services right away, call [phone]. The after-hours call center is staffed 24 hours to handle ride assistance and urgent reservation requests (including discharges) 365 days a year. Urgent trips include sick visits, hospital discharge requests and life-sustaining treatment.  Call the [facility] Line at [phone], at any time, 24 hours a day, 7 days a week. If you are in danger or need immediate medical attention call 911.  If you would like help to quit smoking, call 1-800-QUIT-NOW (224-412-8622) OR Espaol: 1-855-Djelo-Ya (4-742-595-6387) o para ms informacin haga clic aqu or Text READY to 564-332 to register via text  Mr. Juhasz,   Please see education materials related to smoking cessation  provided by MyChart link.  Patient verbalizes understanding of instructions and care plan provided today and agrees to view in MyChart. Active MyChart status and patient understanding of how to access instructions and care plan via MyChart confirmed with patient.     Telephone follow up appointment with Managed Medicaid care management team member scheduled for:01/22/23 at 11:15am  [name] RN, BSN [place]  Value[facility]Based Care [facility] RN Care Coordinator [phone]   Following is a copy of your plan of care:  Care Plan : RN Care Manager Plan of Care  Updates made by [name], RN since 12/20/2022 12:00 AM     Problem: Health Management needs related to COPD      Long-Range Goal: Development of Plan of Care to address Health Management needs related to COPD   Start Date: 05/03/2022  Expected End Date: 02/13/2023  Note:   Current Barriers:  Chronic Disease Management support and education needs related to HTN and COPD-Patient unsure of all of his medications. He is receiving 3 medications in a bubble pack, but unsure of where they are coming from. Reports feeling better and improved BP.  RNCM Clinical Goal(s):  Patient will verbalize understanding of plan for management of HTN and COPD as evidenced by patient reports take all medications exactly as prescribed and will call provider for medication related questions as evidenced by patient reports    attend all scheduled medical appointments: 01/01/23 with [facility], 01/02/23 with Dermatology, Colonoscopy on 01/26/23, 12/26 with HTN Clinic,  as evidenced by provider documentation        through collaboration with RN Care manager, provider, and care team.   Interventions: Inter-disciplinary care team collaboration (  see longitudinal plan of care) Evaluation of current treatment plan related to  self management and patient's adherence to plan as established by provider Provided therapeutic  listening Discussed Opthalmology referral-patient scheduled with [facility] [phone] on 01/01/23 at 10am-called to verify appointment   Hypertension Interventions:  (Status:  Goal on track:  Yes.) Long Term Goal-Patient reports BP at home 140-150/80s Last practice recorded BP readings:  BP Readings from Last 3 Encounters:  12/19/22 139/80  12/14/22 (!) 203/117  12/06/22 (!) 166/112   Most recent eGFR/CrCl:  Lab Results  Component Value Date   EGFR 36 (L) 07/06/2022    No components found for: "CRCL"  Evaluation of current treatment plan related to hypertension self management and patient's adherence to plan as established by provider Reviewed medications with patient and discussed importance of compliance Discussed complications of poorly controlled blood pressure such as heart disease, stroke, circulatory complications, vision complications, kidney impairment, sexual dysfunction Assessed social determinant of health barriers Advised patient to take all medications to all provider appointments Discussed the importance of smoking cessation, improving diet and exercise in controlling HTN Assisted with scheduling with Nephrology per referral with [facility] [phone], 02/28/23 at 10am Explained the importance of taking medications at the same time each day and checking BP 1-2 hours after taking medications Reviewed follow up with HTN clinic on 02/08/23    Smoking Cessation Interventions:  (Status:  Goal on track:  Yes.) Long Term Goal currently smoking 3-5 cigarettes/day, patient continues to desire cessation Reviewed smoking history:  tobacco abuse of >20 years; currently smoking 1-2 ppd Previous quit attempts, unsuccessful several /successful using none  Reports smoking within 30 minutes of waking up Reports triggers to smoke include: stress Reports motivation to quit smoking includes: family and health On a scale of 1-10, reports MOTIVATION to quit is 10 On a scale  of 1-10, reports CONFIDENCE in quitting is 10  Evaluation of current treatment plan reviewed Provided patient with printed smoking cessation educational materials Provided contact information for [place] [employer] [facility] (1-800-QUIT-NOW) Assessed social determinant of health barriers   COPD: (Status: Goal on Track (progressing): YES.) Long Term Goal  Reviewed medications with patient Provided instruction about proper use of medications used for management of COPD including inhalers Advised patient to self assesses COPD action plan zone and make appointment with provider if in the yellow zone for 48 hours without improvement Advised patient to engage in light exercise as tolerated 3-5 days a week to aid in the the management of COPD Provided education about and advised patient to utilize infection prevention strategies to reduce risk of respiratory infection Discussed the importance of adequate rest and management of fatigue with COPD Assessed social determinant of health barriers Reviewed sleep study evaluation and discussed with patient-waiting to be scheduled for new mask fitting Advised patient to continue to work on smoking cessation  Patient Goals/Self-Care Activities: Take medications as prescribed   Attend all scheduled provider appointments Call pharmacy for medication refills 3-7 days in advance of running out of medications Call provider office for new concerns or questions  Work with the social worker to address care coordination needs and will continue to work with the clinical team to address health care and disease management related needs identify and avoid work-related triggers develop a rescue plan develop a new routine to improve sleep get at least 7 to 8 hours of sleep at night

## 2022-12-21 ENCOUNTER — Observation Stay (HOSPITAL_COMMUNITY)
Admission: EM | Admit: 2022-12-21 | Discharge: 2022-12-22 | Disposition: A | Discharge status: Home / Self Care | Payer: 59 | Attending: Emergency Medicine | Admitting: Emergency Medicine

## 2022-12-21 ENCOUNTER — Other Ambulatory Visit: Payer: Self-pay

## 2022-12-21 ENCOUNTER — Other Ambulatory Visit: Payer: Self-pay | Admitting: Gastroenterology

## 2022-12-21 ENCOUNTER — Encounter (HOSPITAL_COMMUNITY): Payer: Self-pay | Admitting: Emergency Medicine

## 2022-12-21 ENCOUNTER — Emergency Department (HOSPITAL_COMMUNITY): Payer: 59

## 2022-12-21 DIAGNOSIS — M109 Gout, unspecified: Secondary | ICD-10-CM | POA: Insufficient documentation

## 2022-12-21 DIAGNOSIS — B20 Human immunodeficiency virus [HIV] disease: Secondary | ICD-10-CM | POA: Diagnosis not present

## 2022-12-21 DIAGNOSIS — Z2989 Encounter for other specified prophylactic measures: Secondary | ICD-10-CM | POA: Insufficient documentation

## 2022-12-21 DIAGNOSIS — M545 Low back pain, unspecified: Secondary | ICD-10-CM | POA: Insufficient documentation

## 2022-12-21 DIAGNOSIS — J441 Chronic obstructive pulmonary disease with (acute) exacerbation: Principal | ICD-10-CM

## 2022-12-21 DIAGNOSIS — F419 Anxiety disorder, unspecified: Secondary | ICD-10-CM | POA: Diagnosis not present

## 2022-12-21 DIAGNOSIS — I2510 Atherosclerotic heart disease of native coronary artery without angina pectoris: Secondary | ICD-10-CM | POA: Diagnosis present

## 2022-12-21 DIAGNOSIS — I130 Hypertensive heart and chronic kidney disease with heart failure and stage 1 through stage 4 chronic kidney disease, or unspecified chronic kidney disease: Secondary | ICD-10-CM | POA: Insufficient documentation

## 2022-12-21 DIAGNOSIS — Z1152 Encounter for screening for COVID-19: Secondary | ICD-10-CM | POA: Insufficient documentation

## 2022-12-21 DIAGNOSIS — Z21 Asymptomatic human immunodeficiency virus [HIV] infection status: Secondary | ICD-10-CM | POA: Diagnosis present

## 2022-12-21 DIAGNOSIS — I480 Paroxysmal atrial fibrillation: Secondary | ICD-10-CM | POA: Diagnosis not present

## 2022-12-21 DIAGNOSIS — F32A Depression, unspecified: Secondary | ICD-10-CM | POA: Diagnosis not present

## 2022-12-21 DIAGNOSIS — N1832 Chronic kidney disease, stage 3b: Secondary | ICD-10-CM | POA: Diagnosis not present

## 2022-12-21 DIAGNOSIS — J9601 Acute respiratory failure with hypoxia: Secondary | ICD-10-CM | POA: Diagnosis not present

## 2022-12-21 DIAGNOSIS — F17210 Nicotine dependence, cigarettes, uncomplicated: Secondary | ICD-10-CM | POA: Insufficient documentation

## 2022-12-21 DIAGNOSIS — Z720 Tobacco use: Secondary | ICD-10-CM | POA: Diagnosis present

## 2022-12-21 DIAGNOSIS — Z79899 Other long term (current) drug therapy: Secondary | ICD-10-CM | POA: Diagnosis not present

## 2022-12-21 DIAGNOSIS — R062 Wheezing: Secondary | ICD-10-CM | POA: Diagnosis not present

## 2022-12-21 DIAGNOSIS — G4733 Obstructive sleep apnea (adult) (pediatric): Secondary | ICD-10-CM | POA: Diagnosis not present

## 2022-12-21 DIAGNOSIS — I5032 Chronic diastolic (congestive) heart failure: Secondary | ICD-10-CM | POA: Diagnosis present

## 2022-12-21 DIAGNOSIS — Z7901 Long term (current) use of anticoagulants: Secondary | ICD-10-CM | POA: Insufficient documentation

## 2022-12-21 DIAGNOSIS — R911 Solitary pulmonary nodule: Secondary | ICD-10-CM | POA: Diagnosis not present

## 2022-12-21 DIAGNOSIS — R0689 Other abnormalities of breathing: Secondary | ICD-10-CM | POA: Diagnosis not present

## 2022-12-21 DIAGNOSIS — I739 Peripheral vascular disease, unspecified: Secondary | ICD-10-CM | POA: Diagnosis not present

## 2022-12-21 DIAGNOSIS — I10 Essential (primary) hypertension: Secondary | ICD-10-CM | POA: Diagnosis present

## 2022-12-21 LAB — CBC WITH DIFFERENTIAL/PLATELET
Abs Immature Granulocytes: 0.04 10*3/uL (ref 0.00–0.07)
Basophils Absolute: 0.1 10*3/uL (ref 0.0–0.1)
Basophils Relative: 1 %
Eosinophils Absolute: 0.1 10*3/uL (ref 0.0–0.5)
Eosinophils Relative: 1 %
HCT: 32.9 % — ABNORMAL LOW (ref 39.0–52.0)
Hemoglobin: 11 g/dL — ABNORMAL LOW (ref 13.0–17.0)
Immature Granulocytes: 0 %
Lymphocytes Relative: 5 %
Lymphs Abs: 0.5 10*3/uL — ABNORMAL LOW (ref 0.7–4.0)
MCH: 31 pg (ref 26.0–34.0)
MCHC: 33.4 g/dL (ref 30.0–36.0)
MCV: 92.7 fL (ref 80.0–100.0)
Monocytes Absolute: 0.1 10*3/uL (ref 0.1–1.0)
Monocytes Relative: 1 %
Neutro Abs: 8.9 10*3/uL — ABNORMAL HIGH (ref 1.7–7.7)
Neutrophils Relative %: 92 %
Platelets: 276 10*3/uL (ref 150–400)
RBC: 3.55 MIL/uL — ABNORMAL LOW (ref 4.22–5.81)
RDW: 14.4 % (ref 11.5–15.5)
WBC: 9.7 10*3/uL (ref 4.0–10.5)
nRBC: 0 % (ref 0.0–0.2)

## 2022-12-21 LAB — BASIC METABOLIC PANEL
Anion gap: 9 (ref 5–15)
BUN: 15 mg/dL (ref 8–23)
CO2: 18 mmol/L — ABNORMAL LOW (ref 22–32)
Calcium: 9 mg/dL (ref 8.9–10.3)
Chloride: 111 mmol/L (ref 98–111)
Creatinine, Ser: 1.8 mg/dL — ABNORMAL HIGH (ref 0.61–1.24)
GFR, Estimated: 42 mL/min — ABNORMAL LOW (ref >60)
Glucose, Bld: 126 mg/dL — ABNORMAL HIGH (ref 70–99)
Potassium: 3.8 mmol/L (ref 3.5–5.1)
Sodium: 138 mmol/L (ref 135–145)

## 2022-12-21 LAB — LACTATE DEHYDROGENASE: LDH: 182 U/L (ref 98–192)

## 2022-12-21 LAB — PROCALCITONIN: Procalcitonin: <0.1 ng/mL

## 2022-12-21 LAB — SARS CORONAVIRUS 2 BY RT PCR: SARS Coronavirus 2 by RT PCR: NEGATIVE

## 2022-12-21 MED ORDER — IPRATROPIUM-ALBUTEROL 0.5-2.5 (3) MG/3ML IN SOLN
RESPIRATORY_TRACT | Status: AC
  Filled 2022-12-21: qty 3

## 2022-12-21 MED ORDER — ALBUTEROL SULFATE (2.5 MG/3ML) 0.083% IN NEBU
2.5000 mg | INHALATION_SOLUTION | Freq: Four times a day (QID) | RESPIRATORY_TRACT | Status: DC | PRN
Start: 2022-12-21 — End: 2022-12-21

## 2022-12-21 MED ORDER — ALBUTEROL SULFATE (2.5 MG/3ML) 0.083% IN NEBU
2.5000 mg | INHALATION_SOLUTION | Freq: Once | RESPIRATORY_TRACT | Status: AC
  Administered 2022-12-21: 2.5 mg via RESPIRATORY_TRACT
  Filled 2022-12-21: qty 3

## 2022-12-21 MED ORDER — ASPIRIN 81 MG PO TBEC
81.0000 mg | DELAYED_RELEASE_TABLET | Freq: Every day | ORAL | Status: DC
  Administered 2022-12-21 – 2022-12-22 (×2): 81 mg via ORAL
  Filled 2022-12-21 (×2): qty 1

## 2022-12-21 MED ORDER — IRBESARTAN 300 MG PO TABS
300.0000 mg | ORAL_TABLET | Freq: Once | ORAL | Status: AC
  Administered 2022-12-21: 300 mg via ORAL
  Filled 2022-12-21: qty 1

## 2022-12-21 MED ORDER — ENOXAPARIN SODIUM 40 MG/0.4ML IJ SOSY
40.0000 mg | PREFILLED_SYRINGE | INTRAMUSCULAR | Status: DC
  Administered 2022-12-21: 40 mg via SUBCUTANEOUS
  Filled 2022-12-21: qty 0.4

## 2022-12-21 MED ORDER — HYDRALAZINE HCL 25 MG PO TABS
100.0000 mg | ORAL_TABLET | Freq: Once | ORAL | Status: AC
  Administered 2022-12-21: 100 mg via ORAL
  Filled 2022-12-21: qty 4

## 2022-12-21 MED ORDER — ACETAMINOPHEN 325 MG PO TABS: 650.0000 mg | ORAL_TABLET | Freq: Four times a day (QID) | ORAL | Status: DC | PRN

## 2022-12-21 MED ORDER — ACETAMINOPHEN 325 MG PO TABS
650.0000 mg | ORAL_TABLET | Freq: Once | ORAL | Status: AC
  Administered 2022-12-21: 650 mg via ORAL
  Filled 2022-12-21: qty 2

## 2022-12-21 MED ORDER — GUAIFENESIN ER 600 MG PO TB12
600.0000 mg | ORAL_TABLET | Freq: Two times a day (BID) | ORAL | Status: DC
  Administered 2022-12-21 – 2022-12-22 (×3): 600 mg via ORAL
  Filled 2022-12-21 (×3): qty 1

## 2022-12-21 MED ORDER — IPRATROPIUM-ALBUTEROL 0.5-2.5 (3) MG/3ML IN SOLN: 3.0000 mL | Freq: Four times a day (QID) | RESPIRATORY_TRACT | Status: DC | PRN

## 2022-12-21 MED ORDER — IPRATROPIUM-ALBUTEROL 0.5-2.5 (3) MG/3ML IN SOLN
3.0000 mL | Freq: Once | RESPIRATORY_TRACT | Status: AC
  Administered 2022-12-21: 3 mL via RESPIRATORY_TRACT
  Filled 2022-12-21: qty 3

## 2022-12-21 MED ORDER — MONTELUKAST SODIUM 10 MG PO TABS
10.0000 mg | ORAL_TABLET | Freq: Every day | ORAL | Status: DC
  Administered 2022-12-21: 10 mg via ORAL
  Filled 2022-12-21: qty 1

## 2022-12-21 MED ORDER — FLUTICASONE FUROATE-VILANTEROL 200-25 MCG/ACT IN AEPB
1.0000 | INHALATION_SPRAY | Freq: Every day | RESPIRATORY_TRACT | Status: DC
  Administered 2022-12-21 – 2022-12-22 (×2): 1 via RESPIRATORY_TRACT
  Filled 2022-12-21: qty 28

## 2022-12-21 MED ORDER — METHYLPREDNISOLONE SODIUM SUCC 40 MG IJ SOLR
40.0000 mg | Freq: Two times a day (BID) | INTRAMUSCULAR | Status: DC
  Administered 2022-12-21 – 2022-12-22 (×2): 40 mg via INTRAVENOUS
  Filled 2022-12-21 (×2): qty 1

## 2022-12-21 MED ORDER — DOLUTEGRAVIR-LAMIVUDINE 50-300 MG PO TABS
1.0000 | ORAL_TABLET | Freq: Every day | ORAL | Status: DC
  Administered 2022-12-21 – 2022-12-22 (×2): 1 via ORAL
  Filled 2022-12-21 (×2): qty 1

## 2022-12-21 MED ORDER — ATORVASTATIN CALCIUM 80 MG PO TABS
80.0000 mg | ORAL_TABLET | Freq: Every day | ORAL | Status: DC
  Administered 2022-12-22: 80 mg via ORAL
  Filled 2022-12-21: qty 1

## 2022-12-21 MED ORDER — GUAIFENESIN-DM 100-10 MG/5ML PO SYRP: 5.0000 mL | ORAL_SOLUTION | Freq: Four times a day (QID) | ORAL | Status: DC | PRN

## 2022-12-21 MED ORDER — AMLODIPINE BESYLATE 5 MG PO TABS
10.0000 mg | ORAL_TABLET | Freq: Once | ORAL | Status: AC
  Administered 2022-12-21: 10 mg via ORAL
  Filled 2022-12-21: qty 2

## 2022-12-21 MED ORDER — HYDRALAZINE HCL 50 MG PO TABS
100.0000 mg | ORAL_TABLET | Freq: Three times a day (TID) | ORAL | Status: DC
  Administered 2022-12-21 – 2022-12-22 (×3): 100 mg via ORAL
  Filled 2022-12-21 (×4): qty 2

## 2022-12-21 MED ORDER — ACETAMINOPHEN 650 MG RE SUPP: 650.0000 mg | Freq: Four times a day (QID) | RECTAL | Status: DC | PRN

## 2022-12-21 MED ORDER — UMECLIDINIUM BROMIDE 62.5 MCG/ACT IN AEPB
1.0000 | INHALATION_SPRAY | Freq: Every day | RESPIRATORY_TRACT | Status: DC
  Administered 2022-12-21 – 2022-12-22 (×2): 1 via RESPIRATORY_TRACT
  Filled 2022-12-21: qty 7

## 2022-12-21 MED ORDER — NEBIVOLOL HCL 5 MG PO TABS
5.0000 mg | ORAL_TABLET | Freq: Once | ORAL | Status: AC
  Administered 2022-12-21: 5 mg via ORAL
  Filled 2022-12-21: qty 1

## 2022-12-21 MED ORDER — HYDRALAZINE HCL 20 MG/ML IJ SOLN
10.0000 mg | Freq: Four times a day (QID) | INTRAMUSCULAR | Status: DC | PRN
  Filled 2022-12-21: qty 1

## 2022-12-21 NOTE — H&P (Signed)
[facility] History and Physical  TANUSH DREES OZH:[id number] DOB: 09/23/1955 DOA: 12/21/2022 PCP: [name], MD  Presented from: Home Chief Complaint: Shortness of breath  History of Present Illness: Kevin [name] is a 67 y.o. male with PMH significant for HIV/AIDS, HTN, HLD, paroxysmal A-fib, CHF, CKD, PAD, COPD, continues to smoke, OSA on CPAP, chronic back pain, gout, RCC.  Patient was brought to the ED by EMS this morning from home for respiratory distress.  Per EMS, patient was taking his CPAP mask off as he had to spit.  He began to have shortness of breath and went into respiratory distress.  En route to the ED, patient was given albuterol/Atrovent nebs, Solu-Medrol 125 mg 1 dose Of note, he follows up with pulmonologist Dr. Delton Coombes.  Last seen in the office on 10/11 and underwent PFT.  Per pulmonology note, PFT showed severe obstruction without a bronchodilator response.  In the ED, he was able to speak short sentences No fever, heart rate in 60s and 70s, breathing in the low 20s, initial blood pressure was 221/116 and later improved down to 168.  At rest, patient is able to maintain saturation more than 90% on room air. Initial labs with WBC count 13.9, hemoglobin 9.6, BUN/creatinine 19/2.19 COVID PCR negative Chest x-ray unremarkable.  Patient was given IV Solu-Medrol, albuterol nebs.  Patient continues to remain short of breath and hence hospitalist service was consulted for inpatient management.  At the time of my evaluation, patient was lying on bed.  On 3 L oxygen by nasal cannula. Felt better than at presentation but still felt short of breath and did not feel comfortable going home.  Review of Systems:  All systems were reviewed and were negative unless otherwise mentioned in the HPI   Past medical history: Past Medical History:  Diagnosis Date   AIDS (acquired immune deficiency syndrome) (HCC) 08/17/2016   Chronic diastolic CHF (congestive heart  failure), NYHA class 3 (HCC) 01/2016   Chronic lower back pain    CKD (chronic kidney disease), stage III (HCC)    COPD (chronic obstructive pulmonary disease) (HCC)    Gout    "forearms, hands, ankles, feet" (06/05/2016)   Headache    "weekly" (06/05/2016)   Heart murmur    Hypertension    Hypertensive crisis 08/15/2017   Lipoma 07/06/2022   OSA on CPAP    PAD (peripheral artery disease) (HCC)    PAF (paroxysmal atrial fibrillation) (HCC) 01/2016   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    Past surgical history: Past Surgical History:  Procedure Laterality Date   IR RADIOLOGIST EVAL & MGMT  05/31/2021   IR RADIOLOGIST EVAL & MGMT  07/26/2021   LEFT HEART CATH AND CORONARY ANGIOGRAPHY N/A 10/23/2016   Procedure: LEFT HEART CATH AND CORONARY ANGIOGRAPHY;  Surgeon: Marykay Lex, MD;  Location: [facility] INVASIVE CV LAB;  Service: Cardiovascular;  Laterality: N/A;   LOWER EXTREMITY ANGIOGRAPHY N/A 07/17/2016   Procedure: Lower Extremity Angiography;  Surgeon: Runell Gess, MD;  Location: [facility] INVASIVE CV LAB;  Service: Cardiovascular;  Laterality: N/A;   LOWER EXTREMITY INTERVENTION N/A 06/05/2016   Procedure: Lower Extremity Intervention;  Surgeon: Runell Gess, MD;  Location[facility] INVASIVE CV LAB;  Service: Cardiovascular;  Laterality: N/A;   PERIPHERAL VASCULAR ATHERECTOMY Right 07/17/2016   Procedure: Peripheral Vascular Atherectomy;  Surgeon: Runell Gess, MD;  Location: [facility] INVASIVE CV LAB;  Service: Cardiovascular;  Laterality: Right;  SFA   PERIPHERAL VASCULAR INTERVENTION  06/05/2016   Procedure: Peripheral Vascular Intervention;  Surgeon: Runell Gess, MD;  Location: [facility] INVASIVE CV LAB;  Service: Cardiovascular;;  left SFA   RADIOLOGY WITH ANESTHESIA Right 06/29/2021   Procedure: RIGHT RENAL CRYOABLATION;  Surgeon: Richarda Overlie, MD;  Location: [facility] ORS;  Service: Anesthesiology;  Laterality: Right;    Social History:  reports that he has been smoking cigarettes and  e-cigarettes. He has a 4.2 pack-year smoking history. He has never been exposed to tobacco smoke. He has never used smokeless tobacco. He reports that he does not currently use alcohol after a past usage of about 2.0 standard drinks of alcohol per week. He reports that he does not currently use drugs.  Allergies:  No Known Allergies Patient has no known allergies.   Family history:  Family History  Problem Relation Age of Onset   High blood pressure Mother    Lupus Mother    Seizures Daughter    Heart attack Daughter      Physical Exam: Vitals:   12/21/22 0915 12/21/22 1000 12/21/22 1053 12/21/22 1317  BP: (!) 187/88 (!) 165/85  (!) 165/99  Pulse: 90 79  75  Resp: 15 16  20   Temp:   97.7 F (36.5 C) 97.8 F (36.6 C)  TempSrc:   Oral Oral  SpO2: 92% 99%  95%  Weight:       Wt Readings from Last 3 Encounters:  12/21/22 73.3 kg  12/19/22 73.3 kg  12/14/22 73.2 kg   Body mass index is 21.92 kg/m.  General exam: Pleasant, middle-aged African-American male. Skin: No rashes, lesions or ulcers. HEENT: Atraumatic, normocephalic, no obvious bleeding Lungs: Mild bilateral wheezing, better after steroids given earlier CVS: Regular rate and rhythm, no murmur GI/Abd soft, nontender, nondistended, bowel sound present CNS: Alert, awake, oriented x 3 Psychiatry: Mood appropriate Extremities: No pedal edema, no calf tenderness   ------------------------------------------------------------------------------------------------------ Assessment/Plan: Principal Problem:   COPD exacerbation (HCC)  Acute exacerbation of COPD Chronic everyday smoker Presented with sudden respiratory distress No evidence of pneumonia on chest x-ray. COPD likely due to viral bronchitis versus pending mucous plug Given IV Solu-Medrol in the ED. WBC count normal.  Obtain procalcitonin level.  Not started on antibiotics at this time Continue Solu-Medrol, Mucinex, incentive spirometry Counseled to quit.   Nicotine patch offered. Continue Singulair Recent Labs  Lab 12/21/22 0942  WBC 9.7   Uncontrolled hypertension Chronic diastolic CHF Initial blood pressure was over 200. PTA med list shows amlodipine 10 mg daily, valsartan 320 mg daily, hydralazine 100 mg 3 times daily, nebivolol 5 mg daily.  Not sure how many of them he is compliant to. Given all in the ED this morning.  Continue to monitor. I would resume hydralazine for now.  Once home med list is confirmed, patient gradually Last echocardiogram from 1 year ago showed EF of 60 to 65%, grade 1 diastolic dysfunction, normal right ventricular size and systolic function and mildly elevated PASP  paroxysmal A-fib I do not see any AV nodal blocking agent Dr. Odessa Fleming list.  PAD/HLD PTA meds-continue aspirin and Lipitor 80 mg daily,  CKD H/o RCC Creatinine seems to be at baseline.  Continue to monitor. Recent Labs    07/19/22 1545 07/25/22 0017 07/26/22 1104 08/15/22 0659 08/17/22 0815 09/11/22 0654 10/12/22 2255 11/18/22 0824 12/06/22 0140 12/21/22 0942  BUN 14 19 27* 21 34* 21 23 14 19 15   CREATININE 1.77* 2.04* 2.04* 1.90* 2.66* 2.14* 1.78* 1.81* 2.19* 1.80*  CO2 23 24 24  21* 22 18* 20* 20* 20* 18*   HIV/AIDS PTA meds Dovato daily Follows up with ID as an outpatient. I asked him if he had any PCP pneumonia in the past.  He denies.  Obtain LDH level  OSA on CPAP  Pulmonary nodules Follows up with  pulmonary as an outpatient for interval imaging  Anxiety/depression  chronic back pain, gout  Home med list has not been obtained/updated by [facility] at the time of admission.  Please double check admission reconciliation.   Mobility: Encourage ambulation  Goals of care   Code Status: Full Code    DVT prophylaxis:  enoxaparin (LOVENOX) injection 40 mg Start: 12/21/22 1215   Antimicrobials: None Fluid: None Consultants: None Family Communication: None at bedside  Dispo: The patient is from: Home               Anticipated d/c is to: Home  Diet: Diet Order             Diet Heart Room service appropriate? Yes; Fluid consistency: Thin  Diet effective now                    ------------------------------------------------------------------------------------- Severity of Illness: The appropriate patient status for this patient is OBSERVATION. Observation status is judged to be reasonable and necessary in order to provide the required intensity of service to ensure the patient's safety. The patient's presenting symptoms, physical exam findings, and initial radiographic and laboratory data in the context of their medical condition is felt to place them at decreased risk for further clinical deterioration. Furthermore, it is anticipated that the patient will be medically stable for discharge from the hospital within 2 midnights of admission.  -------------------------------------------------------------------------------------  Home Meds: Prior to Admission medications   Medication Sig Start Date End Date Taking? Authorizing Provider  acetylcysteine (MUCOMYST) 10 % nebulizer solution Take 4 mLs by nebulization every 4 (four) hours. 09/19/22   [name], MD  albuterol (VENTOLIN HFA) 108 (90 Base) MCG/ACT inhaler Inhale 1-2 puffs into the lungs every 6 (six) hours as needed for wheezing or shortness of breath. 12/06/22   Cobb, Ruby Cola, NP  amLODipine-valsartan (EXFORGE) 10-320 MG tablet Take 1 tablet by mouth daily. 11/17/22   Corrin Parker, PA-C  aspirin EC 81 MG tablet Take 1 tablet (81 mg total) by mouth daily, swallow whole 04/28/22   [name], MD  atorvastatin (LIPITOR) 80 MG tablet Take 1 tablet (80 mg total) by mouth daily. 10/25/22     busPIRone (BUSPAR) 7.5 MG tablet Take 1 tablet (7.5 mg total) by mouth 2 (two) times daily. Patient not taking: Reported on 12/20/2022 12/19/22 03/19/23  [name], MD  dolutegravir-lamiVUDine (DOVATO) 50-300 MG tablet Take 1  tablet by mouth daily. 07/06/22   Randall Hiss, MD  doxycycline (VIBRAMYCIN) 100 MG capsule Take 1 capsule (100 mg total) by mouth 2 (two) times daily. Patient not taking: Reported on 12/20/2022 12/06/22   Horton, Mayer Masker, MD  Fluticasone-Umeclidin-Vilant (TRELEGY ELLIPTA) 200-62.5-25 MCG/ACT AEPB Inhale 1 puff into the lungs daily. 09/08/22     hydrALAZINE (APRESOLINE) 100 MG tablet Take 1 tablet (100 mg total) by mouth 3 (three) times daily. 10/13/22   Rai, Ripudeep K, MD  ipratropium-albuterol (DUONEB) 0.5-2.5 (3) MG/3ML SOLN Take 3 mLs by nebulization every 6 (six) hours as needed (wheezing or shortness of breath). 08/17/22   Gwyneth Sprout, MD  montelukast (SINGULAIR) 10 MG tablet Take 1 tablet (10 mg total) by mouth at  bedtime. 09/08/22     naloxone (NARCAN) nasal spray 4 mg/0.1 mL Call 911. Spray contents of 1 spray into one nostril. Repeat in 2-3 min if symptoms of opioid emergency persists; alternate nostrils 10/20/22     nebivolol (BYSTOLIC) 5 MG tablet Take 1 tablet (5 mg total) by mouth daily. 10/24/22       Labs on Admission:   CBC: Recent Labs  Lab 12/21/22 0942  WBC 9.7  NEUTROABS 8.9*  HGB 11.0*  HCT 32.9*  MCV 92.7  PLT 276    Basic Metabolic Panel: Recent Labs  Lab 12/21/22 0942  NA 138  K 3.8  CL 111  CO2 18*  GLUCOSE 126*  BUN 15  CREATININE 1.80*  CALCIUM 9.0    Liver Function Tests: No results for input(s): "AST", "ALT", "ALKPHOS", "BILITOT", "PROT", "ALBUMIN" in the last 168 hours. No results for input(s): "LIPASE", "AMYLASE" in the last 168 hours. No results for input(s): "AMMONIA" in the last 168 hours.  Cardiac Enzymes: No results for input(s): "CKTOTAL", "CKMB", "CKMBINDEX", "TROPONINI" in the last 168 hours.  BNP (last 3 results) Recent Labs    07/14/22 2129 07/25/22 0017 11/18/22 0831  BNP 43.4 18.8 97.5    ProBNP (last 3 results) No results for input(s): "PROBNP" in the last 8760 hours.  CBG: No results for input(s):  "GLUCAP" in the last 168 hours.  Lipase     Component Value Date/Time   LIPASE 60 (H) 07/09/2021 1215     Urinalysis    Component Value Date/Time   COLORURINE YELLOW 04/28/2022 1532   APPEARANCEUR CLEAR 04/28/2022 1532   LABSPEC 1.014 04/28/2022 1532   PHURINE 5.5 04/28/2022 1532   GLUCOSEU NEGATIVE 04/28/2022 1532   HGBUR NEGATIVE 04/28/2022 1532   BILIRUBINUR NEGATIVE 12/27/2021 0722   KETONESUR NEGATIVE 04/28/2022 1532   PROTEINUR 1+ (A) 04/28/2022 1532   UROBILINOGEN 0.2 01/17/2013 0022   NITRITE NEGATIVE 04/28/2022 1532   LEUKOCYTESUR NEGATIVE 04/28/2022 1532     Drugs of Abuse     Component Value Date/Time   LABOPIA NONE DETECTED 12/28/2021 0950   COCAINSCRNUR POSITIVE (A) 12/28/2021 0950   LABBENZ NONE DETECTED 12/28/2021 0950   AMPHETMU NONE DETECTED 12/28/2021 0950   THCU NONE DETECTED 12/28/2021 0950   LABBARB NONE DETECTED 12/28/2021 0950      Radiological Exams on Admission: DG Chest Portable 1 View  Result Date: 12/21/2022 CLINICAL DATA:  67 year old male with cough and respiratory distress. COPD. EXAM: PORTABLE CHEST 1 VIEW COMPARISON:  Chest CT 09/06/2022.  Portable chest 12/06/2022. FINDINGS: Portable AP upright view at 0717 hours. Chronic pulmonary hyperinflation, lower lung volumes compared to the July CT. Mediastinal contours remain normal. Allowing for portable technique the lungs are clear. No pneumothorax or pleural effusion. Visualized tracheal air column is within normal limits. No acute osseous abnormality identified. IMPRESSION: No acute cardiopulmonary abnormality. Electronically Signed   By: Odessa Fleming M.D.   On: 12/21/2022 07:43     Signed, Lorin Glass, MD [facility] 12/21/2022

## 2022-12-21 NOTE — ED Notes (Signed)
Patient reports feeling increased difficulty breathing; patient speaking in clear, full sentences but breathing appears labored; 2L [place] applied for comfort; ED attending physician, Dr. Criss Alvine, made aware via face-to-face conversation.

## 2022-12-21 NOTE — ED Provider Notes (Signed)
[place] EMERGENCY DEPARTMENT AT [facility] Provider Note   CSN: [id number] Arrival date & time: 12/21/22  0700     History  Chief Complaint  Patient presents with   Respiratory Distress    Kevin [name] is a 67 y.o. male.  HPI 67 year old male with a history of COPD presents with acute dyspnea.  Started feeling a little short of breath yesterday and then woke up with acute dyspnea this morning.  He was wearing his CPAP and had to take it off and has been having white sputum.  He denies any fevers.  He states his chest feels tight.  EMS reports that he was having significant work of breathing and they gave two 5-0.5 mg DuoNebs and he is breathing a lot better.  They also gave him 125 mg Solu-Medrol.  He does not wear oxygen at home.  He has chest tightness but states he always has that with this.  This feels like a typical COPD exacerbation to him.  Home Medications Prior to Admission medications   Medication Sig Start Date End Date Taking? Authorizing Provider  acetylcysteine (MUCOMYST) 10 % nebulizer solution Take 4 mLs by nebulization every 4 (four) hours. 09/19/22   [name], MD  albuterol (VENTOLIN HFA) 108 (90 Base) MCG/ACT inhaler Inhale 1-2 puffs into the lungs every 6 (six) hours as needed for wheezing or shortness of breath. 12/06/22   Cobb, Ruby Cola, NP  amLODipine-valsartan (EXFORGE) 10-320 MG tablet Take 1 tablet by mouth daily. 11/17/22   Corrin Parker, PA-C  aspirin EC 81 MG tablet Take 1 tablet (81 mg total) by mouth daily, swallow whole 04/28/22   [name], MD  atorvastatin (LIPITOR) 80 MG tablet Take 1 tablet (80 mg total) by mouth daily. 10/25/22     busPIRone (BUSPAR) 7.5 MG tablet Take 1 tablet (7.5 mg total) by mouth 2 (two) times daily. Patient not taking: Reported on 12/20/2022 12/19/22 03/19/23  [name], MD  dolutegravir-lamiVUDine (DOVATO) 50-300 MG tablet Take 1 tablet by mouth daily. 07/06/22   Randall Hiss, MD   doxycycline (VIBRAMYCIN) 100 MG capsule Take 1 capsule (100 mg total) by mouth 2 (two) times daily. Patient not taking: Reported on 12/20/2022 12/06/22   Horton, Mayer Masker, MD  Fluticasone-Umeclidin-Vilant (TRELEGY ELLIPTA) 200-62.5-25 MCG/ACT AEPB Inhale 1 puff into the lungs daily. 09/08/22     hydrALAZINE (APRESOLINE) 100 MG tablet Take 1 tablet (100 mg total) by mouth 3 (three) times daily. 10/13/22   Rai, Ripudeep K, MD  ipratropium-albuterol (DUONEB) 0.5-2.5 (3) MG/3ML SOLN Take 3 mLs by nebulization every 6 (six) hours as needed (wheezing or shortness of breath). 08/17/22   Gwyneth Sprout, MD  montelukast (SINGULAIR) 10 MG tablet Take 1 tablet (10 mg total) by mouth at bedtime. 09/08/22     naloxone (NARCAN) nasal spray 4 mg/0.1 mL Call 911. Spray contents of 1 spray into one nostril. Repeat in 2-3 min if symptoms of opioid emergency persists; alternate nostrils 10/20/22     nebivolol (BYSTOLIC) 5 MG tablet Take 1 tablet (5 mg total) by mouth daily. 10/24/22         Allergies    Patient has no known allergies.    Review of Systems   Review of Systems  Constitutional:  Negative for fever.  Respiratory:  Positive for cough, chest tightness and shortness of breath.   Cardiovascular:  Negative for chest pain and leg swelling.    Physical Exam Updated Vital Signs  BP (!) 165/85   Pulse 79   Temp 97.7 F (36.5 C) (Oral)   Resp 16   Wt 73.3 kg   SpO2 99%   BMI 21.92 kg/m  Physical Exam Vitals and nursing note reviewed.  Constitutional:      General: He is not in acute distress.    Appearance: He is well-developed. He is not ill-appearing or diaphoretic.  HENT:     Head: Normocephalic and atraumatic.  Cardiovascular:     Rate and Rhythm: Normal rate and regular rhythm.     Heart sounds: Normal heart sounds.  Pulmonary:     Effort: Pulmonary effort is normal. No tachypnea or accessory muscle usage.     Breath sounds: Wheezing (mild) present.  Abdominal:     General: There is no  distension.  Musculoskeletal:     Right lower leg: No edema.     Left lower leg: No edema.  Skin:    General: Skin is warm and dry.  Neurological:     Mental Status: He is alert.     ED Results / Procedures / Treatments   Labs (all labs ordered are listed, but only abnormal results are displayed) Labs Reviewed  BASIC METABOLIC PANEL - Abnormal; Notable for the following components:      Result Value   CO2 18 (*)    Glucose, Bld 126 (*)    Creatinine, Ser 1.80 (*)    GFR, Estimated 42 (*)    All other components within normal limits  CBC WITH DIFFERENTIAL/PLATELET - Abnormal; Notable for the following components:   RBC 3.55 (*)    Hemoglobin 11.0 (*)    HCT 32.9 (*)    Neutro Abs 8.9 (*)    Lymphs Abs 0.5 (*)    All other components within normal limits  SARS CORONAVIRUS 2 BY RT PCR  EXPECTORATED SPUTUM ASSESSMENT W GRAM STAIN, RFLX TO RESP C  PROCALCITONIN  LACTATE DEHYDROGENASE    EKG EKG Interpretation Date/Time:  Thursday December 21 2022 07:06:44 EST Ventricular Rate:  74 PR Interval:  190 QRS Duration:  92 QT Interval:  399 QTC Calculation: 443 R Axis:   68  Text Interpretation: Sinus rhythm Left ventricular hypertrophy Anterior infarct, old Borderline T abnormalities, inferior leads no significant change since Oct 2024 Confirmed by Pricilla Loveless [phone]) on 12/21/2022 7:14:12 AM  Radiology DG Chest Portable 1 View  Result Date: 12/21/2022 CLINICAL DATA:  67 year old male with cough and respiratory distress. COPD. EXAM: PORTABLE CHEST 1 VIEW COMPARISON:  Chest CT 09/06/2022.  Portable chest 12/06/2022. FINDINGS: Portable AP upright view at 0717 hours. Chronic pulmonary hyperinflation, lower lung volumes compared to the July CT. Mediastinal contours remain normal. Allowing for portable technique the lungs are clear. No pneumothorax or pleural effusion. Visualized tracheal air column is within normal limits. No acute osseous abnormality identified. IMPRESSION: No  acute cardiopulmonary abnormality. Electronically Signed   By: Odessa Fleming M.D.   On: 12/21/2022 07:43    Procedures Procedures    Medications Ordered in ED Medications  acetaminophen (TYLENOL) tablet 650 mg (has no administration in time range)    Or  acetaminophen (TYLENOL) suppository 650 mg (has no administration in time range)  hydrALAZINE (APRESOLINE) injection 10 mg (has no administration in time range)  enoxaparin (LOVENOX) injection 40 mg (has no administration in time range)  guaiFENesin (MUCINEX) 12 hr tablet 600 mg (has no administration in time range)  guaiFENesin-dextromethorphan (ROBITUSSIN DM) 100-10 MG/5ML syrup 5 mL (has no administration in  time range)  ipratropium-albuterol (DUONEB) 0.5-2.5 (3) MG/3ML nebulizer solution 3 mL (has no administration in time range)  methylPREDNISolone sodium succinate (SOLU-MEDROL) 40 mg/mL injection 40 mg (has no administration in time range)  ipratropium-albuterol (DUONEB) 0.5-2.5 (3) MG/3ML nebulizer solution 3 mL ( Nebulization Not Given 12/21/22 0802)  albuterol (PROVENTIL) (2.5 MG/3ML) 0.083% nebulizer solution 2.5 mg (2.5 mg Nebulization Given 12/21/22 0728)  amLODipine (NORVASC) tablet 10 mg (10 mg Oral Given 12/21/22 0852)  irbesartan (AVAPRO) tablet 300 mg (300 mg Oral Given 12/21/22 0852)  hydrALAZINE (APRESOLINE) tablet 100 mg (100 mg Oral Given 12/21/22 0852)  nebivolol (BYSTOLIC) tablet 5 mg (5 mg Oral Given 12/21/22 0922)  ipratropium-albuterol (DUONEB) 0.5-2.5 (3) MG/3ML nebulizer solution 3 mL (3 mLs Nebulization Given 12/21/22 0944)  albuterol (PROVENTIL) (2.5 MG/3ML) 0.083% nebulizer solution 2.5 mg (2.5 mg Nebulization Given 12/21/22 0944)  acetaminophen (TYLENOL) tablet 650 mg (650 mg Oral Given 12/21/22 1053)    ED Course/ Medical Decision Making/ A&P                                 Medical Decision Making Amount and/or Complexity of Data Reviewed Independent Historian: EMS Labs: ordered.    Details: Normal WBC.   CKD. Radiology: ordered and independent interpretation performed.    Details: No pneumonia ECG/medicine tests: ordered and independent interpretation performed.    Details: Unchanged, no ischemia  Risk OTC drugs. Prescription drug management. Decision regarding hospitalization.   Patient presents with COPD exacerbation.  Seem to transiently get better but then started having recurrent symptoms requiring more breathing treatments.  He is not distressed and I do not think needs BiPAP or intubation but is not enough to go home.  Was given steroids by EMS.  Will need admission.  We have given him his oral home meds for blood pressure.  Of note, he feels like the Bystolic is causing some side effects where he does not feel good as this was given after his initial blood pressure meds and he started feeling poorly.  Something to consider about possibly changing his meds in the hospital or upon discharge.  Otherwise, patient will be admitted to the hospital service, discussed with Dr. Pola Corn.        Final Clinical Impression(s) / ED Diagnoses Final diagnoses:  COPD exacerbation [facility])    Rx / DC Orders ED Discharge Orders     None         Pricilla Loveless, MD 12/21/22 1219

## 2022-12-21 NOTE — Plan of Care (Signed)

## 2022-12-21 NOTE — ED Notes (Addendum)
ED TO INPATIENT HANDOFF REPORT  ED Nurse Name and Phone #: Einar Grad [phone]  S Name/Age/Gender Kevin [name] 67 y.o. male Room/Bed: 026C/026C  Code Status   Code Status: Full Code  Home/SNF/Other Home Patient oriented to: self, place, time, and situation Is this baseline? Yes   Triage Complete: Triage complete  Chief Complaint COPD exacerbation (HCC) [J44.1]  Triage Note Pt in from home via [facility] with respiratory distress. Per EMS, pt was taking his CPAP mask off this morning as he had to spit, began to have sob and distress. Hx COPD. Pt given 2 albuterol/atrovent nebs en route, 125mg  solumedrol. Arrives able to speak short sentences.   Allergies No Known Allergies  Level of Care/Admitting Diagnosis ED Disposition     ED Disposition  Admit   Condition  --   Comment  Hospital Area: [facility] [[id number]]  Level of Care: Med-Surg [16]  May place patient in observation at [facility] if equivalent level of care is available:: Yes  Covid Evaluation: Asymptomatic - no recent exposure (last 10 days) testing not required  Diagnosis: COPD exacerbation [facility]) [098119]  Admitting Physician: Lorin Glass [[id number]]  Attending Physician: Lorin Glass [[id number]]          B Medical/Surgery History 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    Past Surgical History:  Procedure Laterality Date   IR RADIOLOGIST EVAL & MGMT  05/31/2021   IR RADIOLOGIST EVAL & MGMT  07/26/2021   LEFT HEART CATH AND CORONARY ANGIOGRAPHY N/A 10/23/2016   Procedure: LEFT HEART CATH AND CORONARY ANGIOGRAPHY;  Surgeon: Marykay Lex, MD;  Location: [facility] INVASIVE CV LAB;  Service: Cardiovascular;  Laterality: N/A;   LOWER EXTREMITY ANGIOGRAPHY N/A 07/17/2016   Procedure: Lower Extremity Angiography;  Surgeon: Runell Gess, MD;  Location: [facility] INVASIVE CV LAB;  Service: Cardiovascular;  Laterality: N/A;   LOWER EXTREMITY INTERVENTION N/A 06/05/2016   Procedure: Lower Extremity Intervention;  Surgeon: Runell Gess, MD;  Location: [facility] INVASIVE CV LAB;  Service: Cardiovascular;  Laterality: N/A;   PERIPHERAL VASCULAR ATHERECTOMY Right 07/17/2016   Procedure: Peripheral Vascular Atherectomy;  Surgeon: Runell Gess, MD;  Location: [facility] INVASIVE CV LAB;  Service: Cardiovascular;  Laterality: Right;  SFA   PERIPHERAL VASCULAR INTERVENTION  06/05/2016   Procedure: Peripheral Vascular Intervention;  Surgeon: Runell Gess, MD;  Location: [facility] INVASIVE CV LAB;  Service: Cardiovascular;;  left SFA   RADIOLOGY WITH ANESTHESIA Right 06/29/2021   Procedure: RIGHT RENAL CRYOABLATION;  Surgeon: Richarda Overlie, MD;  Location: [facility] ORS;  Service: Anesthesiology;  Laterality: Right;     A IV Location/Drains/Wounds Patient Lines/Drains/Airways Status     Active Line/Drains/Airways     Name Placement date Placement time Site Days   Peripheral IV 12/21/22 20 G Left Antecubital 12/21/22  5784  Antecubital  less than 1            Intake/Output Last 24 hours No intake or output data in the 24 hours ending 12/21/22 1209  Labs/Imaging Results for orders placed or performed during the hospital encounter of 12/21/22 (from the past 48 hour(s))  SARS Coronavirus 2 by RT PCR (hospital order, performed in [facility] hospital lab) *cepheid single result test* Anterior Nasal Swab      Status: None   Collection Time: 12/21/22  7:06 AM   Specimen: Anterior Nasal Swab  Result Value Ref Range   SARS Coronavirus 2 by RT PCR NEGATIVE NEGATIVE    Comment: Performed at [facility] Lab, [address] [place], [place] [address]  Basic metabolic panel     Status: Abnormal   Collection Time: 12/21/22  9:42 AM  Result Value Ref Range   Sodium 138 135 - 145 mmol/L   Potassium 3.8 3.5 - 5.1 mmol/L   Chloride 111 98 - 111 mmol/L   CO2 18 (L) 22 - 32 mmol/L   Glucose, Bld 126 (H) 70 - 99 mg/dL    Comment: Glucose reference range applies only to samples taken after fasting for at least 8 hours.   BUN 15 8 - 23 mg/dL   Creatinine, Ser 0.10 (H) 0.61 - 1.24 mg/dL   Calcium 9.0 8.9 - 27.2 mg/dL   GFR, Estimated 42 (L) >60 mL/min    Comment: (NOTE) Calculated using the CKD-EPI Creatinine Equation (2021)    Anion gap 9 5 - 15    Comment: Performed at [facility] Lab, [address], [place], [place] [address]  CBC with Differential     Status: Abnormal   Collection Time: 12/21/22  9:42 AM  Result Value Ref Range   WBC 9.7 4.0 - 10.5 K/uL   RBC 3.55 (L) 4.22 - 5.81 MIL/uL   Hemoglobin 11.0 (L) 13.0 - 17.0 g/dL   HCT 40.3 (L) 47.4 - 25.9 %   MCV 92.7 80.0 - 100.0 fL   MCH 31.0 26.0 - 34.0 pg   MCHC 33.4 30.0 - 36.0 g/dL   RDW 56.3 87.5 - 64.3 %   Platelets 276 150 - 400 K/uL   nRBC 0.0 0.0 - 0.2 %   Neutrophils Relative % 92 %   Neutro Abs 8.9 (H) 1.7 - 7.7 K/uL   Lymphocytes Relative 5 %   Lymphs Abs 0.5 (L) 0.7 - 4.0 K/uL   Monocytes Relative 1 %   Monocytes Absolute 0.1 0.1 - 1.0 K/uL   Eosinophils Relative 1 %   Eosinophils Absolute 0.1 0.0 - 0.5 K/uL   Basophils Relative 1 %   Basophils Absolute 0.1 0.0 - 0.1 K/uL   Immature Granulocytes 0 %   Abs Immature Granulocytes 0.04 0.00 - 0.07 K/uL    Comment: Performed at [facility] Lab, [address], [place], [place] [address]   DG Chest Portable 1 View  Result Date: 12/21/2022 CLINICAL DATA:   67 year old male with cough and respiratory distress. COPD. EXAM: PORTABLE CHEST 1 VIEW COMPARISON:  Chest CT 09/06/2022.  Portable chest 12/06/2022. FINDINGS: Portable AP upright view at 0717 hours. Chronic pulmonary hyperinflation, lower lung volumes compared to the July CT. Mediastinal contours remain normal. Allowing for portable technique the lungs are clear. No pneumothorax or pleural effusion. Visualized tracheal air column is within normal limits. No acute osseous abnormality identified. IMPRESSION: No acute cardiopulmonary abnormality. Electronically Signed   By: Althea Grimmer.D.  On: 12/21/2022 07:43    Pending Labs Unresulted Labs (From admission, onward)     Start     Ordered   12/22/22 0500  Basic metabolic panel  Tomorrow morning,   R        12/21/22 1201   12/22/22 0500  CBC  Tomorrow morning,   R        12/21/22 1201   12/21/22 1204  Lactate dehydrogenase  Add-on,   AD        12/21/22 1203   12/21/22 1203  Procalcitonin  Add-on,   AD       References:    Procalcitonin Lower Respiratory Tract Infection AND Sepsis Procalcitonin Algorithm   12/21/22 1202   12/21/22 1203  Expectorated Sputum Assessment w Gram Stain, Rflx to Resp Cult  Once,   R        12/21/22 1202            Vitals/Pain Today's Vitals   12/21/22 0915 12/21/22 1000 12/21/22 1052 12/21/22 1053  BP: (!) 187/88 (!) 165/85    Pulse: 90 79    Resp: 15 16    Temp:    97.7 F (36.5 C)  TempSrc:    Oral  SpO2: 92% 99%    Weight:      PainSc:   7      Isolation Precautions No active isolations  Medications Medications  acetaminophen (TYLENOL) tablet 650 mg (has no administration in time range)    Or  acetaminophen (TYLENOL) suppository 650 mg (has no administration in time range)  hydrALAZINE (APRESOLINE) injection 10 mg (has no administration in time range)  enoxaparin (LOVENOX) injection 40 mg (has no administration in time range)  guaiFENesin (MUCINEX) 12 hr tablet 600 mg (has no administration in  time range)  guaiFENesin-dextromethorphan (ROBITUSSIN DM) 100-10 MG/5ML syrup 5 mL (has no administration in time range)  ipratropium-albuterol (DUONEB) 0.5-2.5 (3) MG/3ML nebulizer solution 3 mL (has no administration in time range)  methylPREDNISolone sodium succinate (SOLU-MEDROL) 40 mg/mL injection 40 mg (has no administration in time range)  ipratropium-albuterol (DUONEB) 0.5-2.5 (3) MG/3ML nebulizer solution 3 mL ( Nebulization Not Given 12/21/22 0802)  albuterol (PROVENTIL) (2.5 MG/3ML) 0.083% nebulizer solution 2.5 mg (2.5 mg Nebulization Given 12/21/22 0728)  amLODipine (NORVASC) tablet 10 mg (10 mg Oral Given 12/21/22 0852)  irbesartan (AVAPRO) tablet 300 mg (300 mg Oral Given 12/21/22 0852)  hydrALAZINE (APRESOLINE) tablet 100 mg (100 mg Oral Given 12/21/22 0852)  nebivolol (BYSTOLIC) tablet 5 mg (5 mg Oral Given 12/21/22 0922)  ipratropium-albuterol (DUONEB) 0.5-2.5 (3) MG/3ML nebulizer solution 3 mL (3 mLs Nebulization Given 12/21/22 0944)  albuterol (PROVENTIL) (2.5 MG/3ML) 0.083% nebulizer solution 2.5 mg (2.5 mg Nebulization Given 12/21/22 0944)  acetaminophen (TYLENOL) tablet 650 mg (650 mg Oral Given 12/21/22 1053)    Mobility walks     Focused Assessments Pulmonary Assessment Handoff:  Lung sounds:   O2 Device: 2L Nasal Cannula for comfort      R Recommendations: See Admitting Provider Note  Report given to:   Additional Notes:

## 2022-12-21 NOTE — ED Triage Notes (Signed)
Pt in from home via [facility] with respiratory distress. Per EMS, pt was taking his CPAP mask off this morning as he had to spit, began to have sob and distress. Hx COPD. Pt given 2 albuterol/atrovent nebs en route, 125mg  solumedrol. Arrives able to speak short sentences.

## 2022-12-22 ENCOUNTER — Telehealth: Payer: Self-pay

## 2022-12-22 ENCOUNTER — Other Ambulatory Visit (HOSPITAL_COMMUNITY): Payer: Self-pay

## 2022-12-22 ENCOUNTER — Encounter (HOSPITAL_COMMUNITY): Payer: Self-pay | Admitting: Internal Medicine

## 2022-12-22 DIAGNOSIS — J9601 Acute respiratory failure with hypoxia: Secondary | ICD-10-CM

## 2022-12-22 DIAGNOSIS — I2510 Atherosclerotic heart disease of native coronary artery without angina pectoris: Secondary | ICD-10-CM | POA: Diagnosis not present

## 2022-12-22 DIAGNOSIS — I2583 Coronary atherosclerosis due to lipid rich plaque: Secondary | ICD-10-CM | POA: Diagnosis not present

## 2022-12-22 DIAGNOSIS — N1832 Chronic kidney disease, stage 3b: Secondary | ICD-10-CM

## 2022-12-22 DIAGNOSIS — I10 Essential (primary) hypertension: Secondary | ICD-10-CM

## 2022-12-22 DIAGNOSIS — Z21 Asymptomatic human immunodeficiency virus [HIV] infection status: Secondary | ICD-10-CM

## 2022-12-22 DIAGNOSIS — I5032 Chronic diastolic (congestive) heart failure: Secondary | ICD-10-CM

## 2022-12-22 DIAGNOSIS — J441 Chronic obstructive pulmonary disease with (acute) exacerbation: Secondary | ICD-10-CM | POA: Diagnosis not present

## 2022-12-22 LAB — BASIC METABOLIC PANEL
Anion gap: 9 (ref 5–15)
BUN: 26 mg/dL — ABNORMAL HIGH (ref 8–23)
CO2: 19 mmol/L — ABNORMAL LOW (ref 22–32)
Calcium: 9.1 mg/dL (ref 8.9–10.3)
Chloride: 109 mmol/L (ref 98–111)
Creatinine, Ser: 2.2 mg/dL — ABNORMAL HIGH (ref 0.61–1.24)
GFR, Estimated: 33 mL/min — ABNORMAL LOW (ref >60)
Glucose, Bld: 149 mg/dL — ABNORMAL HIGH (ref 70–99)
Potassium: 4.6 mmol/L (ref 3.5–5.1)
Sodium: 137 mmol/L (ref 135–145)

## 2022-12-22 LAB — CBC
HCT: 31.5 % — ABNORMAL LOW (ref 39.0–52.0)
Hemoglobin: 10.9 g/dL — ABNORMAL LOW (ref 13.0–17.0)
MCH: 32 pg (ref 26.0–34.0)
MCHC: 34.6 g/dL (ref 30.0–36.0)
MCV: 92.4 fL (ref 80.0–100.0)
Platelets: 281 10*3/uL (ref 150–400)
RBC: 3.41 MIL/uL — ABNORMAL LOW (ref 4.22–5.81)
RDW: 14.4 % (ref 11.5–15.5)
WBC: 15.5 10*3/uL — ABNORMAL HIGH (ref 4.0–10.5)
nRBC: 0 % (ref 0.0–0.2)

## 2022-12-22 MED ORDER — IRBESARTAN 300 MG PO TABS
300.0000 mg | ORAL_TABLET | Freq: Every day | ORAL | Status: DC
  Administered 2022-12-22: 300 mg via ORAL
  Filled 2022-12-22: qty 1

## 2022-12-22 MED ORDER — AMLODIPINE BESYLATE-VALSARTAN 10-320 MG PO TABS: 1.0000 | ORAL_TABLET | Freq: Every day | ORAL | Status: DC

## 2022-12-22 MED ORDER — PREDNISONE 20 MG PO TABS
40.0000 mg | ORAL_TABLET | ORAL | Status: AC
  Administered 2022-12-22: 40 mg via ORAL
  Filled 2022-12-22: qty 2

## 2022-12-22 MED ORDER — AMLODIPINE BESYLATE 10 MG PO TABS
10.0000 mg | ORAL_TABLET | Freq: Every day | ORAL | Status: DC
  Administered 2022-12-22: 10 mg via ORAL
  Filled 2022-12-22: qty 1

## 2022-12-22 MED ORDER — NEBIVOLOL HCL 5 MG PO TABS
5.0000 mg | ORAL_TABLET | Freq: Every day | ORAL | Status: DC
  Administered 2022-12-22: 5 mg via ORAL
  Filled 2022-12-22: qty 1

## 2022-12-22 MED ORDER — NICOTINE 21 MG/24HR TD PT24
21.0000 mg | MEDICATED_PATCH | Freq: Every day | TRANSDERMAL | Status: DC
  Administered 2022-12-22: 21 mg via TRANSDERMAL
  Filled 2022-12-22: qty 1

## 2022-12-22 MED ORDER — PREDNISONE 5 MG PO TABS
ORAL_TABLET | ORAL | 0 refills | Status: AC
  Filled 2022-12-22 – 2023-06-09 (×6): qty 40, 8d supply, fill #0

## 2022-12-22 NOTE — Assessment & Plan Note (Signed)
12-22-2022 chronic. Pt continues to smoke despite severe COPD. Pt advised to stop smoking. Nicotine patch ordered.

## 2022-12-22 NOTE — Assessment & Plan Note (Signed)
12-22-2022 stable. On ASA, bystolic, lipitor.

## 2022-12-22 NOTE — Assessment & Plan Note (Signed)
12-22-2022 stable. On ASA

## 2022-12-22 NOTE — Progress Notes (Unsigned)
  Care Coordination Note  12/22/2022 Name: Kevin [name] MRN: [id number] DOB: Apr 06, 1955  Kevin [name] is a 67 y.o. year old male who is a primary care patient of [name], MD and is actively engaged with the Care Management team. I reached out to [name] by phone today to assist with re-scheduling an initial visit with the Pharmacist  Follow up plan: Unsuccessful telephone outreach attempt made. A HIPAA compliant phone message was left for the patient providing contact information and requesting a return call.   Penne Lash, RMA Care Guide [facility]  [place], [place] [address] Direct Dial: [phone] [email or web address]

## 2022-12-22 NOTE — Assessment & Plan Note (Signed)
12-22-2022. Stable. Continue Dovato.

## 2022-12-22 NOTE — Assessment & Plan Note (Signed)
12-22-2022 euvolemic. No pulmonary edema on CXR. Pt is not on home diuretics

## 2022-12-22 NOTE — Assessment & Plan Note (Addendum)
12-22-2022 admitted on 12-21-2022 for COPD exacerbation and acute respiratory failure with hypoxia. Continue with IV solumedrol. PFTs on 11/24/22 reviewed and shows severe obstruction without a bronchodilator response, FEV1 42% predicted. Lung volumes are hyperinflated. Diffusion capacity decreased and corrects to normal range when adjusted for alveolar volume. Procal is negative. No leukocytosis. Agree with admitting physician that abx are not indicated at this time. Most likely cause of exacerbation is pt's continued smoking.  Pt states he breathing is great and back to normal. Pt able to walk in hallway off supplemental O2, carrying cup of coffee without any difficulty. Pt requesting to go home today. Has nebulizer machine and meds at home. Will need prednisone taper at discharge

## 2022-12-22 NOTE — Assessment & Plan Note (Signed)
12-22-2022. Pt not normally on home O2. Wean to RA as tolerated

## 2022-12-22 NOTE — Subjective & Objective (Addendum)
Pt seen and examined. Weaned to RA by this provider Pt requesting to go home today.

## 2022-12-22 NOTE — Assessment & Plan Note (Signed)
12-22-2022 hx of resistant HTN. Continue home meds bystolic 5 mg every day, hydralazine 100 mg tid, norvasc 10 mg daily. ARB substituted for Avapro 300 mg daily. Prn IV hydralazine

## 2022-12-22 NOTE — Assessment & Plan Note (Addendum)
12-22-2022 stable. Scr 2.2

## 2022-12-22 NOTE — Discharge Summary (Signed)
[facility] Physician Discharge Summary   Patient name: Kevin [name]  Admit date:     12/21/2022  Discharge date: 12/22/2022  Attending Physician: Lorin Glass [[id number]]  Discharge Physician: Carollee Herter   PCP: [name], MD  Admitted From: Home  Disposition:  Home  Recommendations for Outpatient Follow-up:  Follow up with PCP in 1-2 weeks  Home Health:No Equipment/Devices: None    Discharge Condition:Stable CODE STATUS:FULL Diet recommendation: Heart Healthy Fluid Restriction: None  Hospital Summary: HPI: Kevin [name] is a 67 y.o. male with PMH significant for HIV/AIDS, HTN, HLD, paroxysmal A-fib, CHF, CKD, PAD, COPD, continues to smoke, OSA on CPAP, chronic back pain, gout, RCC.   Patient was brought to the ED by EMS this morning from home for respiratory distress.  Per EMS, patient was taking his CPAP mask off as he had to spit.  He began to have shortness of breath and went into respiratory distress.  En route to the ED, patient was given albuterol/Atrovent nebs, Solu-Medrol 125 mg 1 dose Of note, he follows up with pulmonologist Dr. Delton Coombes.  Last seen in the office on 10/11 and underwent PFT.  Per pulmonology note, PFT showed severe obstruction without a bronchodilator response.   In the ED, he was able to speak short sentences No fever, heart rate in 60s and 70s, breathing in the low 20s, initial blood pressure was 221/116 and later improved down to 168.  At rest, patient is able to maintain saturation more than 90% on room air. Initial labs with WBC count 13.9, hemoglobin 9.6, BUN/creatinine 19/2.19 COVID PCR negative Chest x-ray unremarkable.   Patient was given IV Solu-Medrol, albuterol nebs.  Patient continues to remain short of breath and hence hospitalist service was consulted for inpatient management.   On 3 L oxygen by nasal cannula. Felt better than at presentation but still felt short of breath and did not feel comfortable going  home.  Significant Events: Admitted 12/21/2022 for COPD exacerbation and acute respiratory failure with hypoxia   Significant Labs: Admit WBC 9.7, procal < 0.10  Significant Imaging Studies: Admit CXR shows No acute  cardiopulmonary abnormality   Antibiotic Therapy: Anti-infectives (From admission, onward)    Start     Dose/Rate Route Frequency Ordered Stop   12/21/22 1415  dolutegravir-lamiVUDine (DOVATO) 50-300 MG per tablet 1 tablet        1 tablet Oral Daily 12/21/22 1329         Procedures:   Consultants:    Hospital Course by Problem: * COPD exacerbation (HCC) 12-22-2022 admitted on 12-21-2022 for COPD exacerbation and acute respiratory failure with hypoxia. Continue with IV solumedrol. PFTs on 11/24/22 reviewed and shows severe obstruction without a bronchodilator response, FEV1 42% predicted. Lung volumes are hyperinflated. Diffusion capacity decreased and corrects to normal range when adjusted for alveolar volume. Procal is negative. No leukocytosis. Agree with admitting physician that abx are not indicated at this time. Most likely cause of exacerbation is pt's continued smoking.  Pt states he breathing is great and back to normal. Pt able to walk in hallway off supplemental O2, carrying cup of coffee without any difficulty. Pt requesting to go home today. Has nebulizer machine and meds at home. Will need prednisone taper at discharge  Acute respiratory failure with hypoxia (HCC) 12-22-2022. Pt not normally on home O2. Wean to RA as tolerated  Chronic heart failure with preserved ejection fraction (HFpEF) (HCC) 12-22-2022 euvolemic. No pulmonary edema on CXR. Pt is not on home  diuretics  CAD (coronary artery disease) 12-22-2022 stable. On ASA, bystolic, lipitor.  HIV (human immunodeficiency virus infection) (HCC) 12-22-2022. Stable. Continue Dovato.  CKD stage 3b, GFR 30-44 ml/min (HCC) - baseline SCr 1.8-2.2 12-22-2022 stable. Scr 2.2  Essential  hypertension 12-22-2022 hx of resistant HTN. Continue home meds bystolic 5 mg every day, hydralazine 100 mg tid, norvasc 10 mg daily. ARB substituted for Avapro 300 mg daily. Prn IV hydralazine  Tobacco abuse 12-22-2022 chronic. Pt continues to smoke despite severe COPD. Pt advised to stop smoking. Nicotine patch ordered.  PAF (paroxysmal atrial fibrillation) (HCC) - not started on anticoagulation given low CHA2DS2-VASc score and poor medical follow-up. 12-22-2022 stable. On ASA    Discharge Diagnoses:  Principal Problem:   COPD exacerbation (HCC) Active Problems:   Acute respiratory failure with hypoxia (HCC)   PAF (paroxysmal atrial fibrillation) (HCC) - not started on anticoagulation given low CHA2DS2-VASc score and poor medical follow-up.   Tobacco abuse   Essential hypertension   CKD stage 3b, GFR 30-44 ml/min (HCC) - baseline SCr 1.8-2.2   HIV (human immunodeficiency virus infection) (HCC)   CAD (coronary artery disease)   Chronic heart failure with preserved ejection fraction (HFpEF) [facility])   Discharge Instructions  Discharge Instructions     Call MD for:  difficulty breathing, headache or visual disturbances   Complete by: As directed    Call MD for:  extreme fatigue   Complete by: As directed    Call MD for:  persistant dizziness or light-headedness   Complete by: As directed    Call MD for:  temperature >100.4   Complete by: As directed    Diet - low sodium heart healthy   Complete by: As directed    Increase activity slowly   Complete by: As directed       Allergies as of 12/22/2022   No Known Allergies      Medication List     TAKE these medications    amLODipine-valsartan 10-320 MG tablet Commonly known as: EXFORGE Take 1 tablet by mouth daily.   aspirin EC 81 MG tablet Take 1 tablet (81 mg total) by mouth daily, swallow whole   atorvastatin 80 MG tablet Commonly known as: LIPITOR Take 1 tablet (80 mg total) by mouth daily.   busPIRone 7.5 MG  tablet Commonly known as: BUSPAR Take 1 tablet (7.5 mg total) by mouth 2 (two) times daily.   Dovato 50-300 MG tablet Generic drug: dolutegravir-lamiVUDine Take 1 tablet by mouth daily.   hydrALAZINE 100 MG tablet Commonly known as: APRESOLINE Take 1 tablet (100 mg total) by mouth 3 (three) times daily.   montelukast 10 MG tablet Commonly known as: SINGULAIR Take 1 tablet (10 mg total) by mouth at bedtime.   naloxone 4 MG/0.1ML Liqd nasal spray kit Commonly known as: [employer]. Spray contents of 1 spray into one nostril. Repeat in 2-3 min if symptoms of opioid emergency persists; alternate nostrils   nebivolol 5 MG tablet Commonly known as: BYSTOLIC Take 1 tablet (5 mg total) by mouth daily.   predniSONE 10 MG tablet Commonly known as: DELTASONE Take 4 tablets (40 mg total) by mouth daily for 2 days, THEN 3 tablets (30 mg total) daily for 2 days, THEN 2 tablets (20 mg total) daily for 2 days, THEN 1 tablet (10 mg total) daily for 2 days. Start taking on: December 22, 2022   Trelegy Ellipta 200-62.5-25 MCG/ACT Aepb Generic drug: Fluticasone-Umeclidin-Vilant Inhale 1 puff into the lungs daily.  Ventolin HFA 108 (90 Base) MCG/ACT inhaler Generic drug: albuterol Inhale 1-2 puffs into the lungs every 6 (six) hours as needed for wheezing or shortness of breath.        No Known Allergies  Discharge Exam: Vitals:   12/22/22 0532 12/22/22 0652  BP: (!) 183/93 (!) 157/78  Pulse: 62 62  Resp: 18   Temp: 98.7 F (37.1 C)   SpO2: 99%     Physical Exam Vitals and nursing note reviewed.  Constitutional:      General: He is not in acute distress.    Appearance: He is not toxic-appearing or diaphoretic.  HENT:     Head: Normocephalic and atraumatic.     Nose: Nose normal.  Eyes:     General: No scleral icterus. Cardiovascular:     Rate and Rhythm: Normal rate and regular rhythm.  Pulmonary:     Effort: Pulmonary effort is normal. No respiratory distress.      Breath sounds: Normal breath sounds. No wheezing or rales.  Abdominal:     General: Bowel sounds are normal.     Palpations: Abdomen is soft.  Musculoskeletal:     Right lower leg: No edema.     Left lower leg: No edema.  Skin:    General: Skin is warm and dry.     Capillary Refill: Capillary refill takes less than 2 seconds.  Neurological:     General: No focal deficit present.     Mental Status: He is alert and oriented to person, place, and time.     Gait: Gait normal.     The results of significant diagnostics from this hospitalization (including imaging, microbiology, ancillary and laboratory) are listed below for reference.    Microbiology: Recent Results (from the past 240 hour(s))  SARS Coronavirus 2 by RT PCR (hospital order, performed in [facility] hospital lab) *cepheid single result test* Anterior Nasal Swab     Status: None   Collection Time: 12/21/22  7:06 AM   Specimen: Anterior Nasal Swab  Result Value Ref Range Status   SARS Coronavirus 2 by RT PCR NEGATIVE NEGATIVE Final    Comment: Performed at [facility] Lab, [address], [place], [place] [address]     Labs:  Recent Labs    07/14/22 2129 07/25/22 0017 11/18/22 0831  BNP 43.4 18.8 97.5   Basic Metabolic Panel: Recent Labs  Lab 12/21/22 0942 12/22/22 0551  NA 138 137  K 3.8 4.6  CL 111 109  CO2 18* 19*  GLUCOSE 126* 149*  BUN 15 26*  CREATININE 1.80* 2.20*  CALCIUM 9.0 9.1   CBC: Recent Labs  Lab 12/21/22 0942 12/22/22 0551  WBC 9.7 15.5*  NEUTROABS 8.9*  --   HGB 11.0* 10.9*  HCT 32.9* 31.5*  MCV 92.7 92.4  PLT 276 281   Sepsis Labs Recent Labs  Lab 12/21/22 0942 12/22/22 0551  WBC 9.7 15.5*   Microbiology Recent Results (from the past 240 hour(s))  SARS Coronavirus 2 by RT PCR (hospital order, performed in [facility] hospital lab) *cepheid single result test* Anterior Nasal Swab     Status: None   Collection Time: 12/21/22  7:06 AM   Specimen: Anterior Nasal Swab   Result Value Ref Range Status   SARS Coronavirus 2 by RT PCR NEGATIVE NEGATIVE Final    Comment: Performed at [facility] Lab, [address] [place], [place] [address]    Procedures/Studies: DG Chest Portable 1 View  Result  Date: 12/21/2022 CLINICAL DATA:  67 year old male with cough and respiratory distress. COPD. EXAM: PORTABLE CHEST 1 VIEW COMPARISON:  Chest CT 09/06/2022.  Portable chest 12/06/2022. FINDINGS: Portable AP upright view at 0717 hours. Chronic pulmonary hyperinflation, lower lung volumes compared to the July CT. Mediastinal contours remain normal. Allowing for portable technique the lungs are clear. No pneumothorax or pleural effusion. Visualized tracheal air column is within normal limits. No acute osseous abnormality identified. IMPRESSION: No acute cardiopulmonary abnormality. Electronically Signed   By: Odessa Fleming M.D.   On: 12/21/2022 07:43   DG Chest Portable 1 View  Result Date: 12/06/2022 CLINICAL DATA:  Shortness of breath, increasing. History of COPD and CHF. EXAM: PORTABLE CHEST 1 VIEW COMPARISON:  11/18/2022 FINDINGS: Heart size and pulmonary vascularity are normal. Mediastinal contours appear intact. Emphysematous changes in the lungs. No airspace disease or consolidation. No pleural effusions. No pneumothorax. IMPRESSION: Emphysematous changes in the lungs. No evidence of active pulmonary disease. Electronically Signed   By: Burman Nieves M.D.   On: 12/06/2022 02:55    Time coordinating discharge: 40 mins  SIGNED:  Carollee Herter, DO [facility] 12/22/22, 8:56 AM

## 2022-12-22 NOTE — Progress Notes (Signed)
PROGRESS NOTE    Kevin [name]  GEX:[id number] DOB: Jul 06, 1955 DOA: 12/21/2022 PCP: [name], MD  Subjective: Pt seen and examined. Weaned to RA by this provider Pt requesting to go home today.   Hospital Course: HPI: Kevin [name] is a 67 y.o. male with PMH significant for HIV/AIDS, HTN, HLD, paroxysmal A-fib, CHF, CKD, PAD, COPD, continues to smoke, OSA on CPAP, chronic back pain, gout, RCC.   Patient was brought to the ED by EMS this morning from home for respiratory distress.  Per EMS, patient was taking his CPAP mask off as he had to spit.  He began to have shortness of breath and went into respiratory distress.  En route to the ED, patient was given albuterol/Atrovent nebs, Solu-Medrol 125 mg 1 dose Of note, he follows up with pulmonologist Dr. Delton Coombes.  Last seen in the office on 10/11 and underwent PFT.  Per pulmonology note, PFT showed severe obstruction without a bronchodilator response.   In the ED, he was able to speak short sentences No fever, heart rate in 60s and 70s, breathing in the low 20s, initial blood pressure was 221/116 and later improved down to 168.  At rest, patient is able to maintain saturation more than 90% on room air. Initial labs with WBC count 13.9, hemoglobin 9.6, BUN/creatinine 19/2.19 COVID PCR negative Chest x-ray unremarkable.   Patient was given IV Solu-Medrol, albuterol nebs.  Patient continues to remain short of breath and hence hospitalist service was consulted for inpatient management.   On 3 L oxygen by nasal cannula. Felt better than at presentation but still felt short of breath and did not feel comfortable going home.  Significant Events: Admitted 12/21/2022 for COPD exacerbation and acute respiratory failure with hypoxia   Significant Labs: Admit WBC 9.7, procal < 0.10  Significant Imaging Studies: Admit CXR shows No acute  cardiopulmonary abnormality   Antibiotic Therapy: Anti-infectives (From admission, onward)     Start     Dose/Rate Route Frequency Ordered Stop   12/21/22 1415  dolutegravir-lamiVUDine (DOVATO) 50-300 MG per tablet 1 tablet        1 tablet Oral Daily 12/21/22 1329         Procedures:   Consultants:     Assessment and Plan: * COPD exacerbation (HCC) 12-22-2022 admitted on 12-21-2022 for COPD exacerbation and acute respiratory failure with hypoxia. Continue with IV solumedrol. PFTs on 11/24/22 reviewed and shows severe obstruction without a bronchodilator response, FEV1 42% predicted. Lung volumes are hyperinflated. Diffusion capacity decreased and corrects to normal range when adjusted for alveolar volume. Procal is negative. No leukocytosis. Agree with admitting physician that abx are not indicated at this time. Most likely cause of exacerbation is pt's continued smoking.  Pt states he breathing is great and back to normal. Pt able to walk in hallway off supplemental O2, carrying cup of coffee without any difficulty. Pt requesting to go home today. Has nebulizer machine and meds at home. Will need prednisone taper at discharge  Acute respiratory failure with hypoxia (HCC) 12-22-2022. Pt not normally on home O2. Wean to RA as tolerated  Chronic heart failure with preserved ejection fraction (HFpEF) (HCC) 12-22-2022 euvolemic. No pulmonary edema on CXR. Pt is not on home diuretics  CAD (coronary artery disease) 12-22-2022 stable. On ASA, bystolic, lipitor.  HIV (human immunodeficiency virus infection) (HCC) 12-22-2022. Stable. Continue Dovato.  CKD stage 3b, GFR 30-44 ml/min (HCC) - baseline SCr 1.8-2.2 12-22-2022 stable. Scr 2.2  Essential hypertension 12-22-2022 hx  of resistant HTN. Continue home meds bystolic 5 mg every day, hydralazine 100 mg tid, norvasc 10 mg daily. ARB substituted for Avapro 300 mg daily. Prn IV hydralazine  Tobacco abuse 12-22-2022 chronic. Pt continues to smoke despite severe COPD. Pt advised to stop smoking. Nicotine patch ordered.  PAF (paroxysmal atrial  fibrillation) (HCC) - not started on anticoagulation given low CHA2DS2-VASc score and poor medical follow-up. 12-22-2022 stable. On ASA   DVT prophylaxis: enoxaparin (LOVENOX) injection 40 mg Start: 12/21/22 1215    Code Status: Full Code Family Communication: no family at bedside Disposition Plan: return home Reason for continuing need for hospitalization: DC today.  Objective: Vitals:   12/21/22 1652 12/21/22 2005 12/22/22 0532 12/22/22 0652  BP: (!) 147/74 (!) 161/86 (!) 183/93 (!) 157/78  Pulse: 77 77 62 62  Resp: 17 18 18    Temp: 98.2 F (36.8 C) 98.2 F (36.8 C) 98.7 F (37.1 C)   TempSrc: Oral Oral Oral   SpO2: 100% 99% 99%   Weight:      Height:        Intake/Output Summary (Last 24 hours) at 12/22/2022 0855 Last data filed at 12/22/2022 0700 Gross per 24 hour  Intake --  Output 550 ml  Net -550 ml   Filed Weights   12/21/22 0705 12/21/22 1317  Weight: 73.3 kg 73.3 kg    Examination:  Physical Exam Vitals and nursing note reviewed.  Constitutional:      General: He is not in acute distress.    Appearance: He is not toxic-appearing or diaphoretic.  HENT:     Head: Normocephalic and atraumatic.     Nose: Nose normal.  Eyes:     General: No scleral icterus. Cardiovascular:     Rate and Rhythm: Normal rate and regular rhythm.  Pulmonary:     Effort: Pulmonary effort is normal. No respiratory distress.     Breath sounds: Normal breath sounds. No wheezing or rales.  Abdominal:     General: Bowel sounds are normal.     Palpations: Abdomen is soft.  Musculoskeletal:     Right lower leg: No edema.     Left lower leg: No edema.  Skin:    General: Skin is warm and dry.     Capillary Refill: Capillary refill takes less than 2 seconds.  Neurological:     General: No focal deficit present.     Mental Status: He is alert and oriented to person, place, and time.     Gait: Gait normal.     Data Reviewed: I have personally reviewed following labs and imaging  studies  CBC: Recent Labs  Lab 12/21/22 0942 12/22/22 0551  WBC 9.7 15.5*  NEUTROABS 8.9*  --   HGB 11.0* 10.9*  HCT 32.9* 31.5*  MCV 92.7 92.4  PLT 276 281   Basic Metabolic Panel: Recent Labs  Lab 12/21/22 0942 12/22/22 0551  NA 138 137  K 3.8 4.6  CL 111 109  CO2 18* 19*  GLUCOSE 126* 149*  BUN 15 26*  CREATININE 1.80* 2.20*  CALCIUM 9.0 9.1   GFR: Estimated Creatinine Clearance: 35.2 mL/min (A) (by C-G formula based on SCr of 2.2 mg/dL (H)). BNP (last 3 results) Recent Labs    07/14/22 2129 07/25/22 0017 11/18/22 0831  BNP 43.4 18.8 97.5   Sepsis Labs: Recent Labs  Lab 12/21/22 1440  PROCALCITON <0.10    Recent Results (from the past 240 hour(s))  SARS Coronavirus 2 by RT PCR (hospital order,  performed in [facility] hospital lab) *cepheid single result test* Anterior Nasal Swab     Status: None   Collection Time: 12/21/22  7:06 AM   Specimen: Anterior Nasal Swab  Result Value Ref Range Status   SARS Coronavirus 2 by RT PCR NEGATIVE NEGATIVE Final    Comment: Performed at [facility], [address] [place], [place] [address]     Radiology Studies: DG Chest Portable 1 View  Result Date: 12/21/2022 CLINICAL DATA:  67 year old male with cough and respiratory distress. COPD. EXAM: PORTABLE CHEST 1 VIEW COMPARISON:  Chest CT 09/06/2022.  Portable chest 12/06/2022. FINDINGS: Portable AP upright view at 0717 hours. Chronic pulmonary hyperinflation, lower lung volumes compared to the July CT. Mediastinal contours remain normal. Allowing for portable technique the lungs are clear. No pneumothorax or pleural effusion. Visualized tracheal air column is within normal limits. No acute osseous abnormality identified. IMPRESSION: No acute cardiopulmonary abnormality. Electronically Signed   By: Odessa Fleming M.D.   On: 12/21/2022 07:43    Scheduled Meds:  amLODipine  10 mg Oral Daily   And   irbesartan  300 mg Oral Daily   aspirin EC  81 mg Oral Daily    atorvastatin  80 mg Oral Daily   dolutegravir-lamiVUDine  1 tablet Oral Daily   enoxaparin (LOVENOX) injection  40 mg Subcutaneous Q24H   fluticasone furoate-vilanterol  1 puff Inhalation Daily   And   umeclidinium bromide  1 puff Inhalation Daily   guaiFENesin  600 mg Oral BID   hydrALAZINE  100 mg Oral TID   montelukast  10 mg Oral QHS   nebivolol  5 mg Oral Daily   nicotine  21 mg Transdermal Daily   predniSONE  40 mg Oral STAT   Continuous Infusions:   LOS: 0 days   Time spent: 35 minutes  Carollee Herter, DO  [facility]  12/22/2022, 8:55 AM

## 2022-12-22 NOTE — Hospital Course (Signed)
HPI: Kevin [name] is a 67 y.o. male with PMH significant for HIV/AIDS, HTN, HLD, paroxysmal A-fib, CHF, CKD, PAD, COPD, continues to smoke, OSA on CPAP, chronic back pain, gout, RCC.   Patient was brought to the ED by EMS this morning from home for respiratory distress.  Per EMS, patient was taking his CPAP mask off as he had to spit.  He began to have shortness of breath and went into respiratory distress.  En route to the ED, patient was given albuterol/Atrovent nebs, Solu-Medrol 125 mg 1 dose Of note, he follows up with pulmonologist Dr. Delton Coombes.  Last seen in the office on 10/11 and underwent PFT.  Per pulmonology note, PFT showed severe obstruction without a bronchodilator response.   In the ED, he was able to speak short sentences No fever, heart rate in 60s and 70s, breathing in the low 20s, initial blood pressure was 221/116 and later improved down to 168.  At rest, patient is able to maintain saturation more than 90% on room air. Initial labs with WBC count 13.9, hemoglobin 9.6, BUN/creatinine 19/2.19 COVID PCR negative Chest x-ray unremarkable.   Patient was given IV Solu-Medrol, albuterol nebs.  Patient continues to remain short of breath and hence hospitalist service was consulted for inpatient management.   On 3 L oxygen by nasal cannula. Felt better than at presentation but still felt short of breath and did not feel comfortable going home.  Significant Events: Admitted 12/21/2022 for COPD exacerbation and acute respiratory failure with hypoxia   Significant Labs: Admit WBC 9.7, procal < 0.10  Significant Imaging Studies: Admit CXR shows No acute  cardiopulmonary abnormality   Antibiotic Therapy: Anti-infectives (From admission, onward)    Start     Dose/Rate Route Frequency Ordered Stop   12/21/22 1415  dolutegravir-lamiVUDine (DOVATO) 50-300 MG per tablet 1 tablet        1 tablet Oral Daily 12/21/22 1329         Procedures:   Consultants:

## 2022-12-22 NOTE — Progress Notes (Signed)
Mobility Specialist Progress Note:    12/22/22 0820  Mobility  Activity Ambulated independently in hallway;Ambulated independently to bathroom;Ambulated independently in room  Level of Assistance Independent  Assistive Device None  Distance Ambulated (ft) 1000 ft  Activity Response Tolerated well  Mobility Referral Yes  $Mobility charge 1 Mobility  Mobility Specialist Start Time (ACUTE ONLY) 0820  Mobility Specialist Stop Time (ACUTE ONLY) 0825  Mobility Specialist Time Calculation (min) (ACUTE ONLY) 5 min   Pt received ambulating independently in hallway after returning to floor. Escorted pt back to room. No c/o SOB or dizziness, SpO2 97% on RA throughout. Pt eager for discharge, all needs met.   Feliciana Rossetti Mobility Specialist Please contact via Special educational needs teacher or  Rehab office at [phone]

## 2022-12-25 ENCOUNTER — Other Ambulatory Visit: Payer: Self-pay

## 2022-12-26 ENCOUNTER — Other Ambulatory Visit: Payer: Self-pay

## 2022-12-27 ENCOUNTER — Other Ambulatory Visit (HOSPITAL_COMMUNITY): Payer: Self-pay

## 2022-12-27 NOTE — Progress Notes (Signed)
  Care Coordination Note  12/27/2022 Name: Kevin [name] MRN: [id number] DOB: 05-Apr-1955  [name] is a 67 y.o. year old male who is a primary care patient of [name], MD and is actively engaged with the Chronic Care Management team. I reached out to [name] by phone today to assist with re-scheduling an initial visit with the Pharmacist  Follow up plan: Patient declines further follow up and engagement by the care management team. Appropriate care team members and provider have been notified via electronic communication.   Penne Lash, RMA Care Guide [facility]  [place], [place] [address] Direct Dial: [phone] [email or web address]

## 2023-01-01 DIAGNOSIS — G453 Amaurosis fugax: Secondary | ICD-10-CM | POA: Diagnosis not present

## 2023-01-01 DIAGNOSIS — H5347 Heteronymous bilateral field defects: Secondary | ICD-10-CM | POA: Diagnosis not present

## 2023-01-01 DIAGNOSIS — H35033 Hypertensive retinopathy, bilateral: Secondary | ICD-10-CM | POA: Diagnosis not present

## 2023-01-01 DIAGNOSIS — H25813 Combined forms of age-related cataract, bilateral: Secondary | ICD-10-CM | POA: Diagnosis not present

## 2023-01-02 ENCOUNTER — Encounter: Payer: Self-pay | Admitting: Dermatology

## 2023-01-02 ENCOUNTER — Ambulatory Visit (INDEPENDENT_AMBULATORY_CARE_PROVIDER_SITE_OTHER): Payer: 59 | Admitting: Dermatology

## 2023-01-02 VITALS — BP 155/86 | HR 62 | Temp 98.3°F

## 2023-01-02 DIAGNOSIS — L720 Epidermal cyst: Secondary | ICD-10-CM

## 2023-01-02 DIAGNOSIS — D492 Neoplasm of unspecified behavior of bone, soft tissue, and skin: Secondary | ICD-10-CM

## 2023-01-02 DIAGNOSIS — D485 Neoplasm of uncertain behavior of skin: Secondary | ICD-10-CM

## 2023-01-02 NOTE — Progress Notes (Signed)
Follow-Up Visit   Subjective  Kevin [name] is a 67 y.o. male who presents for the following: Excision of a neoplasm of skin on his left temple.  The following portions of the chart were reviewed this encounter and updated as appropriate: medications, allergies, medical history  Review of Systems:  No other skin or systemic complaints except as noted in HPI or Assessment and Plan.  Objective  Well appearing patient in no apparent distress; mood and affect are within normal limits.  A focused examination was performed of the following areas:  Left Temple  Relevant physical exam findings are noted in the Assessment and Plan.   Left Temple        Assessment & Plan   Neoplasm of uncertain behavior of skin Left Temple  Skin excision  Lesion length (cm):  2.5 Lesion width (cm):  2 Margin per side (cm):  0.1 Total excision diameter (cm):  2.7 Informed consent: discussed and consent obtained   Timeout: patient name, date of birth, surgical site, and procedure verified   Procedure prep:  Patient was prepped and draped in usual sterile fashion Prep type:  Chlorhexidine Anesthesia: the lesion was anesthetized in a standard fashion   Anesthetic:  1% lidocaine w/ epinephrine 1-100,000 buffered w/ 8.4% NaHCO3 Instrument used: #15 blade   Hemostasis achieved with: suture, pressure and electrodesiccation   Outcome: patient tolerated procedure well with no complications   Post-procedure details: sterile dressing applied and wound care instructions given   Dressing type: petrolatum and pressure dressing    Skin repair Complexity:  Complex Final length (cm):  2.8 Informed consent: discussed and consent obtained   Timeout: patient name, date of birth, surgical site, and procedure verified   Procedure prep:  Patient was prepped and draped in usual sterile fashion Prep type:  Chlorhexidine Anesthesia: the lesion was anesthetized in a standard fashion   Reason for type of  repair: reduce tension to allow closure and allow closure of the large defect   Undermining: area extensively undermined   Subcutaneous layers (deep stitches):  Suture size:  5-0 Suture type: Monocryl (poliglecaprone 25)   Stitches:  Buried vertical mattress Fine/surface layer approximation (top stitches):  Suture size:  6-0 Suture type:  plain gut   Stitches: simple running   Hemostasis achieved with: suture, pressure and electrodesiccation Outcome: patient tolerated procedure well with no complications   Post-procedure details: sterile dressing applied and wound care instructions given   Dressing type: petrolatum and pressure dressing     The surgical wound was then cleaned, prepped, and re-anesthetized as above. Wound edges were undermined extensively along at least one entire edge and at a distance equal to or greater than the width of the defect (see wound defect size above) in order to achieve closure and decrease wound tension and anatomic distortion. Redundant tissue repair including standing cone removal was performed. Hemostasis was achieved with electrocautery. Subcutaneous and epidermal tissues were approximated with the above sutures. The surgical site was then lightly scrubbed with sterile, saline-soaked gauze. The area was then bandaged using Vaseline ointment, non-adherent gauze, gauze pads, and tape to provide an adequate pressure dressing. The patient tolerated the procedure well, was given detailed written and verbal wound care instructions, and was discharged in good condition.   The patient will follow-up: PRN.   No follow-ups on file.  I, Tillie Fantasia, CMA, am acting as scribe for Gwenith Daily, MD.   Documentation: I have reviewed the above documentation for accuracy and  completeness, and I agree with the above.  Gwenith Daily, MD

## 2023-01-02 NOTE — Patient Instructions (Signed)

## 2023-01-03 LAB — SURGICAL PATHOLOGY

## 2023-01-04 ENCOUNTER — Encounter (HOSPITAL_COMMUNITY): Payer: Self-pay | Admitting: Emergency Medicine

## 2023-01-04 ENCOUNTER — Ambulatory Visit (HOSPITAL_COMMUNITY)
Admission: EM | Admit: 2023-01-04 | Discharge: 2023-01-04 | Disposition: A | Discharge status: Home / Self Care | Payer: 59 | Attending: Family Medicine | Admitting: Family Medicine

## 2023-01-04 ENCOUNTER — Ambulatory Visit (HOSPITAL_COMMUNITY): Payer: 59

## 2023-01-04 DIAGNOSIS — R079 Chest pain, unspecified: Secondary | ICD-10-CM

## 2023-01-04 DIAGNOSIS — R051 Acute cough: Secondary | ICD-10-CM

## 2023-01-04 DIAGNOSIS — J441 Chronic obstructive pulmonary disease with (acute) exacerbation: Secondary | ICD-10-CM

## 2023-01-04 DIAGNOSIS — I10 Essential (primary) hypertension: Secondary | ICD-10-CM

## 2023-01-04 DIAGNOSIS — B029 Zoster without complications: Secondary | ICD-10-CM

## 2023-01-04 MED ORDER — PREDNISONE 20 MG PO TABS: 40.0000 mg | ORAL_TABLET | Freq: Every day | ORAL | 0 refills | Status: DC

## 2023-01-04 MED ORDER — VALACYCLOVIR HCL 1 G PO TABS: 1000.0000 mg | ORAL_TABLET | Freq: Three times a day (TID) | ORAL | 0 refills | Status: DC

## 2023-01-04 MED ORDER — HYDROCODONE-ACETAMINOPHEN 7.5-325 MG PO TABS: 1.0000 | ORAL_TABLET | Freq: Four times a day (QID) | ORAL | 0 refills | Status: DC | PRN

## 2023-01-04 MED ORDER — AZITHROMYCIN 250 MG PO TABS: 250.0000 mg | ORAL_TABLET | Freq: Every day | ORAL | 0 refills | Status: DC

## 2023-01-04 NOTE — ED Triage Notes (Signed)
Pt c/o left hip pain, back pain that started hurting a few weeks ago.  Also c/o cough, chills, chest pain for 3 days. He also has a pain rash on left leg that he noticed yesterday and is spreading.

## 2023-01-04 NOTE — Discharge Instructions (Addendum)
Your blood pressure was noted to be elevated during your visit today. If you are currently taking medication for high blood pressure, please ensure you are taking this as directed. If you do not have a history of high blood pressure and your blood pressure remains persistently elevated, you may need to begin taking a medication at some point. You may return here within the next few days to recheck if unable to see your primary care provider or if you do not have a one.  BP (!) 192/117 (BP Location: Left Arm) Comment: Did not take BP medication this morning  Pulse 63   Temp 98.7 F (37.1 C) (Oral)   Resp 20   SpO2 93%   BP Readings from Last 3 Encounters:  01/04/23 (!) 192/117  01/02/23 (!) 155/86  12/22/22 (!) 157/78    Be aware, you have been prescribed pain medications that may cause drowsiness. While taking this medication, do not take any other medications containing acetaminophen (Tylenol). Do not combine with alcohol or recreational drugs. Please do not drive, operate heavy machinery, or take part in activities that require making important decisions while on this medication as your judgement may be clouded.

## 2023-01-04 NOTE — ED Provider Notes (Signed)
[facility]   [id number] 01/04/23 Arrival Time: 1034  ASSESSMENT & PLAN:  1. COPD exacerbation (HCC)   2. Chest pain, unspecified type   3. Acute cough   4. Herpes zoster without complication   5. Elevated blood pressure reading with diagnosis of hypertension    I have personally viewed and independently interpreted the imaging studies ordered this visit. CXR: no acute changes; no pneumothorax.  Patient history and exam consistent with non-cardiac cause of chest pain. Suspect COPD exacerbation.  ECG: Performed today and interpreted by me: sinus rhythm with unchanged inferior and lateral T-wave changes from previous tracings.  Discussed shingles and typical duration. As far as BP goes, he has not take his medication today. Denies current CP/HA/visual changes.  Meds ordered this encounter  Medications   HYDROcodone-acetaminophen (NORCO) 7.5-325 MG tablet    Sig: Take 1 tablet by mouth every 6 (six) hours as needed for moderate pain (pain score 4-6).    Dispense:  15 tablet    Refill:  0   predniSONE (DELTASONE) 20 MG tablet    Sig: Take 2 tablets (40 mg total) by mouth daily.    Dispense:  10 tablet    Refill:  0   azithromycin (ZITHROMAX) 250 MG tablet    Sig: Take 1 tablet (250 mg total) by mouth daily. Take first 2 tablets together, then 1 every day until finished.    Dispense:  6 tablet    Refill:  0   valACYclovir (VALTREX) 1000 MG tablet    Sig: Take 1 tablet (1,000 mg total) by mouth 3 (three) times daily.    Dispense:  21 tablet    Refill:  0   Chest pain precautions given. [place] Controlled Substances Registry consulted for this patient. I feel the risk/benefit ratio today is favorable for proceeding with this prescription for a controlled substance. Medication sedation precautions given.   Follow-up Information     Schedule an appointment as soon as possible for a visit  with [name], MD.   Specialty: Family Medicine Why: For follow up. And  to recheck your blood pressure which was very high today. Contact information: [address] [place] [address] [phone]         [facility].   Specialty: Urgent Care Why: If worsening or failing to improve as anticipated. Contact information: [address] [place] [address] [phone]                Reviewed expectations re: course of current medical issues. Questions answered. Outlined signs and symptoms indicating need for more acute intervention. Patient verbalized understanding. After Visit Summary given.   SUBJECTIVE:  History from: patient. Kevin [name] is a 67 y.o. male who presents with complaint of productive cough; several days; noted this week. Has baseline COPD cough but this is worse. Denies fever. Denies specific SOB. Does reports generalized CP with coughing. Some chills. Denies measured temperature. Also reports painful rash; L leg; x 2-3 days. No tx PTA.  Social History   Tobacco Use  Smoking Status Every Day   Current packs/day: 0.10   Average packs/day: 0.1 packs/day for 42.0 years (4.2 ttl pk-yrs)   Types: Cigarettes, E-cigarettes   Passive exposure: Never  Smokeless Tobacco Never  Tobacco Comments   1-2 cigarettes a day 09/29/22 [place]   Social History   Substance and Sexual Activity  Alcohol Use Not Currently   Alcohol/week: 2.0 standard drinks of alcohol  Types: 2 Cans of beer per week   Comment: rare    OBJECTIVE:  Vitals:   01/04/23 1145 01/04/23 1306  BP: (!) 192/117 (!) 193/112  Pulse: 63   Resp: 20   Temp: 98.7 F (37.1 C)   TempSrc: Oral   SpO2: 93%     General appearance: alert, oriented, no acute distress Eyes: PERRLA; EOMI; conjunctivae normal HENT: normocephalic; atraumatic Neck: supple with FROM Lungs: without labored respirations; speaks full sentences without difficulty;  mild to moderate bilateral wheezing with associated coughing while  taking deep breaths Heart: regular Chest Wall: without tenderness to palpation Abdomen: soft, non-tender; no guarding or rebound tenderness Extremities: without edema; without calf swelling or tenderness; symmetrical without gross deformities Skin: warm and dry; crops of red/purplish papules over LEFT L2-L3 dermatome of LLE; some crusting Neuro: normal gait Psychological: alert and cooperative; normal mood and affect  No Known Allergies  Past Medical History:  Diagnosis Date   AIDS (acquired immune deficiency syndrome) (HCC) 08/17/2016   Amaurosis fugax 08/18/2022   Chronic diastolic CHF (congestive heart failure), NYHA class 3 (HCC) 01/2016   Chronic lower back pain    CKD (chronic kidney disease), stage III (HCC)    COPD (chronic obstructive pulmonary disease) (HCC)    Gout    "forearms, hands, ankles, feet" (06/05/2016)   Headache    "weekly" (06/05/2016)   Heart murmur    Hypertension    Hypertensive crisis 08/15/2017   Lipoma 07/06/2022   OSA on CPAP    PAD (peripheral artery disease) (HCC)    PAF (paroxysmal atrial fibrillation) (HCC) 01/2016   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   Social History   Socioeconomic History   Marital status: Significant Other    Spouse name: Not on file   Number of children: Not on file   Years of education: Not on file   Highest education level: Not on file  Occupational History   Not on file  Tobacco Use   Smoking status: Every Day    Current packs/day: 0.10    Average packs/day: 0.1 packs/day for 42.0 years (4.2 ttl pk-yrs)    Types: Cigarettes, E-cigarettes    Passive exposure: Never   Smokeless tobacco: Never   Tobacco comments:    1-2 cigarettes a day 09/29/22 [place]  Vaping Use   Vaping status: Every Day   Substances: Nicotine, Flavoring  Substance and Sexual Activity   Alcohol use: Not Currently    Alcohol/week: 2.0 standard drinks of alcohol    Types: 2 Cans of beer per week    Comment: rare   Drug use: Not  Currently    Comment: rare   Sexual activity: Not Currently    Comment: declined condoms  Other Topics Concern   Not on file  Social History Narrative   Right handed   Social Determinants of Health   Financial Resource Strain: High Risk (10/05/2022)   Overall Financial Resource Strain (CARDIA)    Difficulty of Paying Living Expenses: Hard  Food Insecurity: No Food Insecurity (12/21/2022)   Hunger Vital Sign    Worried About Running Out of Food in the Last Year: Never true    Ran Out of Food in the Last Year: Never true  Transportation Needs: No Transportation Needs (12/21/2022)   PRAPARE - Administrator, Civil Service (Medical): No    Lack of Transportation (Non-Medical): No  Physical Activity: Inactive (10/05/2022)   Exercise Vital Sign    Days of Exercise per  Week: 0 days    Minutes of Exercise per Session: 0 min  Stress: Stress Concern Present (05/04/2022)   [employer] of Occupational Health - Occupational Stress Questionnaire    Feeling of Stress : To some extent  Social Connections: Not on file  Intimate Partner Violence: Not At Risk (12/21/2022)   Humiliation, Afraid, Rape, and Kick questionnaire    Fear of Current or Ex-Partner: No    Emotionally Abused: No    Physically Abused: No    Sexually Abused: No   Family History  Problem Relation Age of Onset   High blood pressure Mother    Lupus Mother    Seizures Daughter    Heart attack Daughter    Past Surgical History:  Procedure Laterality Date   IR RADIOLOGIST EVAL & MGMT  05/31/2021   IR RADIOLOGIST EVAL & MGMT  07/26/2021   LEFT HEART CATH AND CORONARY ANGIOGRAPHY N/A 10/23/2016   Procedure: LEFT HEART CATH AND CORONARY ANGIOGRAPHY;  Surgeon: Marykay Lex, MD;  Location: [facility] INVASIVE CV LAB;  Service: Cardiovascular;  Laterality: N/A;   LOWER EXTREMITY ANGIOGRAPHY N/A 07/17/2016   Procedure: Lower Extremity Angiography;  Surgeon: Runell Gess, MD;  Location: [facility] INVASIVE CV LAB;  Service:  Cardiovascular;  Laterality: N/A;   LOWER EXTREMITY INTERVENTION N/A 06/05/2016   Procedure: Lower Extremity Intervention;  Surgeon: Runell Gess, MD;  Location: [facility] INVASIVE CV LAB;  Service: Cardiovascular;  Laterality: N/A;   PERIPHERAL VASCULAR ATHERECTOMY Right 07/17/2016   Procedure: Peripheral Vascular Atherectomy;  Surgeon: Runell Gess, MD;  Location: [facility] INVASIVE CV LAB;  Service: Cardiovascular;  Laterality: Right;  SFA   PERIPHERAL VASCULAR INTERVENTION  06/05/2016   Procedure: Peripheral Vascular Intervention;  Surgeon: Runell Gess, MD;  Location: [facility] INVASIVE CV LAB;  Service: Cardiovascular;;  left SFA   RADIOLOGY WITH ANESTHESIA Right 06/29/2021   Procedure: RIGHT RENAL CRYOABLATION;  Surgeon: Richarda Overlie, MD;  Location: [facility];  Service: Anesthesiology;  Laterality: Right;      Mardella Layman, MD 01/04/23 1525

## 2023-01-08 ENCOUNTER — Other Ambulatory Visit: Payer: Self-pay

## 2023-01-08 NOTE — Progress Notes (Signed)
Pt states he just opened last shipment.

## 2023-01-09 ENCOUNTER — Other Ambulatory Visit: Payer: Self-pay

## 2023-01-11 ENCOUNTER — Encounter: Payer: Self-pay | Admitting: Internal Medicine

## 2023-01-11 NOTE — Assessment & Plan Note (Signed)
Educated on CPAP medical issues, goals, comfort Plan- [facility] to refit mask, provide download capability, try nasal mask

## 2023-01-11 NOTE — Assessment & Plan Note (Signed)
Poor control- his daughter will help ensure he follows up with pCP/ Cardiology

## 2023-01-11 NOTE — Assessment & Plan Note (Signed)
Currently uncomplicated Plan- Flu vax, [facility] provide mask instead of mouthpiece for nebulizer machine

## 2023-01-13 ENCOUNTER — Ambulatory Visit (HOSPITAL_COMMUNITY)
Admission: EM | Admit: 2023-01-13 | Discharge: 2023-01-13 | Disposition: A | Discharge status: Home / Self Care | Payer: 59 | Attending: Family Medicine | Admitting: Family Medicine

## 2023-01-13 ENCOUNTER — Encounter (HOSPITAL_COMMUNITY): Payer: Self-pay | Admitting: Emergency Medicine

## 2023-01-13 DIAGNOSIS — I10 Essential (primary) hypertension: Secondary | ICD-10-CM

## 2023-01-13 DIAGNOSIS — B029 Zoster without complications: Secondary | ICD-10-CM

## 2023-01-13 MED ORDER — TRIAMCINOLONE ACETONIDE 0.1 % EX CREA: 1.0000 | TOPICAL_CREAM | Freq: Two times a day (BID) | CUTANEOUS | 0 refills | Status: DC

## 2023-01-13 MED ORDER — VALACYCLOVIR HCL 1 G PO TABS: 1000.0000 mg | ORAL_TABLET | Freq: Three times a day (TID) | ORAL | 0 refills | Status: AC

## 2023-01-13 MED ORDER — HYDROCODONE-ACETAMINOPHEN 7.5-325 MG PO TABS: 1.0000 | ORAL_TABLET | Freq: Four times a day (QID) | ORAL | 0 refills | Status: DC | PRN

## 2023-01-13 NOTE — Discharge Instructions (Addendum)
Be aware, you have been prescribed pain medications that may cause drowsiness. While taking this medication, do not take any other medications containing acetaminophen (Tylenol). Do not combine with alcohol or recreational drugs. Please do not drive, operate heavy machinery, or take part in activities that require making important decisions while on this medication as your judgement may be clouded.  

## 2023-01-13 NOTE — ED Provider Notes (Signed)
[facility]   [id number] 01/13/23 Arrival Time: 1054  ASSESSMENT & PLAN:  1. Herpes zoster without complication   2. Elevated blood pressure reading with diagnosis of hypertension    Care assumed from [place], [place]. I saw pt at last visit. Zoster still causing significant pain issues.  Did inform him that any further narcotic Rx for this problem will need to be from his PCP. He voices understanding.  Meds ordered this encounter  Medications   valACYclovir (VALTREX) 1000 MG tablet    Sig: Take 1 tablet (1,000 mg total) by mouth 3 (three) times daily for 7 days.    Dispense:  21 tablet    Refill:  0    Order Specific Question:   Supervising Provider    Answer:   Merrilee Jansky [id number]   triamcinolone cream (KENALOG) 0.1 %    Sig: Apply 1 Application topically 2 (two) times daily. Apply a small amount twice daily to the affected areas for 10 days and then discontinue    Dispense:  80 g    Refill:  0    Order Specific Question:   Supervising Provider    Answer:   Merrilee Jansky [[id number]]   HYDROcodone-acetaminophen (NORCO) 7.5-325 MG tablet    Sig: Take 1 tablet by mouth every 6 (six) hours as needed for moderate pain (pain score 4-6).    Dispense:  15 tablet    Refill:  0     Discharge Instructions      Be aware, you have been prescribed pain medications that may cause drowsiness. While taking this medication, do not take any other medications containing acetaminophen (Tylenol). Do not combine with alcohol or recreational drugs. Please do not drive, operate heavy machinery, or take part in activities that require making important decisions while on this medication as your judgement may be clouded.        Follow-up Information     Schedule an appointment as soon as possible for a visit  with [name], MD.   Specialty: Family Medicine Why: As soon as possible to discuss your shingles and very high blood pressure. Contact information: [address] [place] [address] [phone]                 Reviewed expectations re: course of current medical issues. Questions answered. Outlined signs and symptoms indicating need for more acute intervention. Patient verbalized understanding. After Visit Summary given.   SUBJECTIVE: History from: patient. Kevin [name] is a 67 y.o. male who reports that previously dx shingles still painful; affecting sleep.  BP still elevated.  OBJECTIVE:  Vitals:   01/13/23 1148  BP: (!) 198/100  Pulse: 63  Resp: 18  Temp: (!) 97.5 F (36.4 C)  TempSrc: Oral  SpO2: 95%    General appearance: alert; no distress Extremities: no edema; symmetrical with no gross deformities Skin: warm and dry; rash still present as described in my previous note although areas are starting to crust over Neurologic: normal gait; normal symmetric reflexes Psychological: alert and cooperative; normal mood and affect  NKDA   Past Medical History:  Diagnosis Date   AIDS (acquired immune deficiency syndrome) (HCC) 08/17/2016   Amaurosis fugax 08/18/2022   Chronic diastolic CHF (congestive heart failure), NYHA class 3 (HCC) 01/2016   Chronic lower back pain    CKD (chronic kidney disease), stage III (HCC)    COPD (chronic obstructive pulmonary disease) (HCC)    Gout    "  forearms, hands, ankles, feet" (06/05/2016)   Headache    "weekly" (06/05/2016)   Heart murmur    Hypertension    Hypertensive crisis 08/15/2017   Lipoma 07/06/2022   OSA on CPAP    PAD (peripheral artery disease) (HCC)    PAF (paroxysmal atrial fibrillation) (HCC) 01/2016   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   Social History   Socioeconomic History   Marital status: Significant Other    Spouse name: Not on file   Number of children: Not on file   Years of education: Not on file   Highest education level: Not on file  Occupational History   Not on file  Tobacco Use   Smoking status: Every  Day    Current packs/day: 0.10    Average packs/day: 0.1 packs/day for 42.0 years (4.2 ttl pk-yrs)    Types: Cigarettes, E-cigarettes    Passive exposure: Never   Smokeless tobacco: Never   Tobacco comments:    1-2 cigarettes a day 09/29/22 [place]  Vaping Use   Vaping status: Every Day   Substances: Nicotine, Flavoring  Substance and Sexual Activity   Alcohol use: Not Currently    Alcohol/week: 2.0 standard drinks of alcohol    Types: 2 Cans of beer per week    Comment: rare   Drug use: Not Currently    Comment: rare   Sexual activity: Not Currently    Comment: declined condoms  Other Topics Concern   Not on file  Social History Narrative   Right handed   Social Determinants of Health   Financial Resource Strain: High Risk (10/05/2022)   Overall Financial Resource Strain (CARDIA)    Difficulty of Paying Living Expenses: Hard  Food Insecurity: No Food Insecurity (12/21/2022)   Hunger Vital Sign    Worried About Running Out of Food in the Last Year: Never true    Ran Out of Food in the Last Year: Never true  Transportation Needs: No Transportation Needs (12/21/2022)   PRAPARE - Administrator, Civil Service (Medical): No    Lack of Transportation (Non-Medical): No  Physical Activity: Inactive (10/05/2022)   Exercise Vital Sign    Days of Exercise per Week: 0 days    Minutes of Exercise per Session: 0 min  Stress: Stress Concern Present (05/04/2022)   [employer] of Occupational Health - Occupational Stress Questionnaire    Feeling of Stress : To some extent  Social Connections: Not on file  Intimate Partner Violence: Not At Risk (12/21/2022)   Humiliation, Afraid, Rape, and Kick questionnaire    Fear of Current or Ex-Partner: No    Emotionally Abused: No    Physically Abused: No    Sexually Abused: No   Family History  Problem Relation Age of Onset   High blood pressure Mother    Lupus Mother    Seizures Daughter    Heart attack Daughter    Past  Surgical History:  Procedure Laterality Date   IR RADIOLOGIST EVAL & MGMT  05/31/2021   IR RADIOLOGIST EVAL & MGMT  07/26/2021   LEFT HEART CATH AND CORONARY ANGIOGRAPHY N/A 10/23/2016   Procedure: LEFT HEART CATH AND CORONARY ANGIOGRAPHY;  Surgeon: Marykay Lex, MD;  Location: [facility] INVASIVE CV LAB;  Service: Cardiovascular;  Laterality: N/A;   LOWER EXTREMITY ANGIOGRAPHY N/A 07/17/2016   Procedure: Lower Extremity Angiography;  Surgeon: Runell Gess, MD;  Location: [facility] INVASIVE CV LAB;  Service: Cardiovascular;  Laterality: N/A;   LOWER EXTREMITY  INTERVENTION N/A 06/05/2016   Procedure: Lower Extremity Intervention;  Surgeon: Runell Gess, MD;  Location: [facility] INVASIVE CV LAB;  Service: Cardiovascular;  Laterality: N/A;   PERIPHERAL VASCULAR ATHERECTOMY Right 07/17/2016   Procedure: Peripheral Vascular Atherectomy;  Surgeon: Runell Gess, MD;  Location: [facility] INVASIVE CV LAB;  Service: Cardiovascular;  Laterality: Right;  SFA   PERIPHERAL VASCULAR INTERVENTION  06/05/2016   Procedure: Peripheral Vascular Intervention;  Surgeon: Runell Gess, MD;  Location: [facility] INVASIVE CV LAB;  Service: Cardiovascular;;  left SFA   RADIOLOGY WITH ANESTHESIA Right 06/29/2021   Procedure: RIGHT RENAL CRYOABLATION;  Surgeon: Richarda Overlie, MD;  Location: [facility] OR[facility];  Service: Anesthesiology;  Laterality: Right;     Mardella Layman, MD 01/13/23 [phone]

## 2023-01-13 NOTE — ED Triage Notes (Signed)
Pt presents to UC for Shingles. States he was here on 11/21 and dx with shingles and feel like sxs are worse.

## 2023-01-13 NOTE — ED Provider Notes (Signed)
[facility]    CSN: [id number] Arrival date & time: 01/13/23  1054      History   Chief Complaint Chief Complaint  Patient presents with   Rash    HPI LEXON BEHLEN is a 67 y.o. male.   67 year old male who returns to urgent care with continued complaints of pain and rash from shingles.  He reports that the left Thigh and groin pain and rash seems worse. Finished all the medication that was prescribed.  He feels that he needs a refill of the pain medication as this is what helped him the most.  Patient signed off to Dr. Tracie Harrier at patient request to see Male provider.    Rash   Past Medical History:  Diagnosis Date   AIDS (acquired immune deficiency syndrome) (HCC) 08/17/2016   Amaurosis fugax 08/18/2022   Chronic diastolic CHF (congestive heart failure), NYHA class 3 (HCC) 01/2016   Chronic lower back pain    CKD (chronic kidney disease), stage III (HCC)    COPD (chronic obstructive pulmonary disease) (HCC)    Gout    "forearms, hands, ankles, feet" (06/05/2016)   Headache    "weekly" (06/05/2016)   Heart murmur    Hypertension    Hypertensive crisis 08/15/2017   Lipoma 07/06/2022   OSA on CPAP    PAD (peripheral artery disease) (HCC)    PAF (paroxysmal atrial fibrillation) (HCC) 01/2016   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    Patient Active Problem List   Diagnosis Date Noted   COPD exacerbation (HCC) 12/21/2022   Anxiety 12/19/2022   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   Skin nodule 08/08/2022   Lung nodule, multiple 07/26/2022   Lipoma 07/06/2022   Chronic low back pain 04/29/2022   Chronic heart failure with preserved ejection fraction (HFpEF) (HCC) 12/27/2021   HLD (hyperlipidemia) 12/27/2021   Right renal mass 06/29/2021   Renal lesion 06/29/2021   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   Acute respiratory failure with hypoxia (HCC)    Callus of foot 07/10/2018   CAD (coronary artery disease) 10/19/2016   Easy  bruising 08/11/2016   Claudication in peripheral vascular disease (HCC) 06/05/2016   CKD stage 3b, GFR 30-44 ml/min (HCC) - baseline SCr 1.8-2.2    Peripheral arterial disease (HCC) 05/09/2016   OSA (obstructive sleep apnea) 03/24/2016   Essential hypertension 02/18/2016   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   Blurry vision, bilateral    HIV (human immunodeficiency virus infection) (HCC) 08/27/2004    Past Surgical History:  Procedure Laterality Date   IR RADIOLOGIST EVAL & MGMT  05/31/2021   IR RADIOLOGIST EVAL & MGMT  07/26/2021   LEFT HEART CATH AND CORONARY ANGIOGRAPHY N/A 10/23/2016   Procedure: LEFT HEART CATH AND CORONARY ANGIOGRAPHY;  Surgeon: Marykay Lex, MD;  Location: [facility] INVASIVE CV LAB;  Service: Cardiovascular;  Laterality: N/A;   LOWER EXTREMITY ANGIOGRAPHY N/A 07/17/2016   Procedure: Lower Extremity Angiography;  Surgeon: Runell Gess, MD;  Location: [facility] INVASIVE CV LAB;  Service: Cardiovascular;  Laterality: N/A;   LOWER EXTREMITY INTERVENTION N/A 06/05/2016   Procedure: Lower Extremity Intervention;  Surgeon: Runell Gess, MD;  Location: [facility] INVASIVE CV LAB;  Service: Cardiovascular;  Laterality: N/A;   PERIPHERAL VASCULAR  ATHERECTOMY Right 07/17/2016   Procedure: Peripheral Vascular Atherectomy;  Surgeon: Runell Gess, MD;  Location: [facility] INVASIVE CV LAB;  Service: Cardiovascular;  Laterality: Right;  SFA   PERIPHERAL VASCULAR INTERVENTION  06/05/2016   Procedure: Peripheral Vascular Intervention;  Surgeon: Runell Gess, MD;  Location: [facility] INVASIVE CV LAB;  Service: Cardiovascular;;  left SFA   RADIOLOGY WITH ANESTHESIA Right 06/29/2021   Procedure: RIGHT RENAL CRYOABLATION;  Surgeon:  Richarda Overlie, MD;  Location: [facility] ORS;  Service: Anesthesiology;  Laterality: Right;       Home Medications    Prior to Admission medications   Medication Sig Start Date End Date Taking? Authorizing Provider  albuterol (VENTOLIN HFA) 108 (90 Base) MCG/ACT inhaler Inhale 1-2 puffs into the lungs every 6 (six) hours as needed for wheezing or shortness of breath. Patient not taking: Reported on 01/04/2023 12/06/22   Cobb, Ruby Cola, NP  amLODipine-valsartan (EXFORGE) 10-320 MG tablet Take 1 tablet by mouth daily. 11/17/22   Corrin Parker, PA-C  aspirin EC 81 MG tablet Take 1 tablet (81 mg total) by mouth daily, swallow whole 04/28/22   [name], MD  atorvastatin (LIPITOR) 80 MG tablet Take 1 tablet (80 mg total) by mouth daily. 10/25/22     azithromycin (ZITHROMAX) 250 MG tablet Take 1 tablet (250 mg total) by mouth daily. Take first 2 tablets together, then 1 every day until finished. 01/04/23   Mardella Layman, MD  busPIRone (BUSPAR) 7.5 MG tablet Take 1 tablet (7.5 mg total) by mouth 2 (two) times daily. Patient not taking: Reported on 12/20/2022 12/19/22 03/19/23  [name], MD  dolutegravir-lamiVUDine (DOVATO) 50-300 MG tablet Take 1 tablet by mouth daily. 07/06/22   Randall Hiss, MD  Fluticasone-Umeclidin-Vilant (TRELEGY ELLIPTA) 200-62.5-25 MCG/ACT AEPB Inhale 1 puff into the lungs daily. 09/08/22     hydrALAZINE (APRESOLINE) 100 MG tablet Take 1 tablet (100 mg total) by mouth 3 (three) times daily. 10/13/22   Rai, Delene Ruffini, MD  HYDROcodone-acetaminophen (NORCO) 7.5-325 MG tablet Take 1 tablet by mouth every 6 (six) hours as needed for moderate pain (pain score 4-6). 01/04/23   Mardella Layman, MD  montelukast (SINGULAIR) 10 MG tablet Take 1 tablet (10 mg total) by mouth at bedtime. 09/08/22     naloxone (NARCAN) nasal spray 4 mg/0.1 mL Call 911. Spray contents of 1 spray into one nostril. Repeat in 2-3 min if symptoms of opioid emergency persists; alternate nostrils 10/20/22      nebivolol (BYSTOLIC) 5 MG tablet Take 1 tablet (5 mg total) by mouth daily. 10/24/22     predniSONE (DELTASONE) 20 MG tablet Take 2 tablets (40 mg total) by mouth daily. 01/04/23   Mardella Layman, MD  valACYclovir (VALTREX) 1000 MG tablet Take 1 tablet (1,000 mg total) by mouth 3 (three) times daily. 01/04/23   Mardella Layman, MD    Family History Family History  Problem Relation Age of Onset   High blood pressure Mother    Lupus Mother    Seizures Daughter    Heart attack Daughter     Social History Social History   Tobacco Use   Smoking status: Every Day    Current packs/day: 0.10    Average packs/day: 0.1 packs/day for 42.0 years (4.2 ttl pk-yrs)    Types: Cigarettes, E-cigarettes    Passive exposure: Never   Smokeless tobacco: Never   Tobacco comments:    1-2 cigarettes a day 09/29/22 [place]  Vaping Use   Vaping status:  Every Day   Substances: Nicotine, Flavoring  Substance Use Topics   Alcohol use: Not Currently    Alcohol/week: 2.0 standard drinks of alcohol    Types: 2 Cans of beer per week    Comment: rare   Drug use: Not Currently    Comment: rare     Allergies   Patient has no known allergies.   Review of Systems Review of Systems  Skin:  Positive for rash.     Physical Exam Triage Vital Signs ED Triage Vitals  Encounter Vitals Group     BP 01/13/23 1148 (!) 198/100     Systolic BP Percentile --      Diastolic BP Percentile --      Pulse Rate 01/13/23 1148 63     Resp 01/13/23 1148 18     Temp 01/13/23 1148 (!) 97.5 F (36.4 C)     Temp Source 01/13/23 1148 Oral     SpO2 01/13/23 1148 95 %     Weight --      Height --      Head Circumference --      Peak Flow --      Pain Score 01/13/23 1147 10     Pain Loc --      Pain Education --      Exclude from Growth Chart --    No data found.  Updated Vital Signs BP (!) 198/100 (BP Location: Right Arm)   Pulse 63   Temp (!) 97.5 F (36.4 C) (Oral)   Resp 18   SpO2 95%   Visual  Acuity Right Eye Distance:   Left Eye Distance:   Bilateral Distance:    Right Eye Near:   Left Eye Near:    Bilateral Near:     Physical Exam   UC Treatments / Results  Labs (all labs ordered are listed, but only abnormal results are displayed) Labs Reviewed - No data to display  EKG   Radiology No results found.  Procedures Procedures (including critical care time)  Medications Ordered in UC Medications - No data to display  Initial Impression / Assessment and Plan / UC Course  I have reviewed the triage vital signs and the nursing notes.  Pertinent labs & imaging results that were available during my care of the patient were reviewed by me and considered in my medical decision making (see chart for details).     Herpes zoster without complication  Elevated blood pressure reading with diagnosis of hypertension   Patient signed off to Dr. Tracie Harrier at patient request to see Male provider. Final Clinical Impressions(s) / UC Diagnoses   Final diagnoses:  None   Discharge Instructions   None    ED Prescriptions   None    PDMP not reviewed this encounter.   Landis Martins, [place] 01/13/23 1429

## 2023-01-14 DIAGNOSIS — Z8619 Personal history of other infectious and parasitic diseases: Secondary | ICD-10-CM

## 2023-01-14 HISTORY — DX: Personal history of other infectious and parasitic diseases: Z86.19

## 2023-01-16 ENCOUNTER — Encounter (HOSPITAL_COMMUNITY): Payer: Self-pay | Admitting: Gastroenterology

## 2023-01-16 NOTE — Progress Notes (Signed)
Pre op call eval Name:Suhail Virgina Evener MD Cardiologist- Rennis Golden MD PulmonolgistDelton Coombes MD  EKG-01/04/23 Echo-12/27/21 Cath-10/23/16 Stress- 09/26/22 ICD/PM- n/a Blood thinner-n/a GLP-1- n/a  Hx:OSA, CHF, Murmur, HTN, COPD, CKD3 renal cell carcinoma,Afib. Last saw cardiology 11/30/22 at appt BP elevated, changed a few meds and next f/u appt 12/26. Last visit with pulm 10/16 they did PFTS , showed severe obstruction but if not having flaring symptoms can proceed with colonoscopy. Since then did go to ED 11/21 for COPD exacerbation, is due to see them next 02/27/23. Per patient feels like breathing is better, what is bothering him most right now is Shingles breakout. No breathing treatments, 02 use.  Anesthesia Review: Yes  After Anesthesia Review- shingles needs to be completely resolved and will need new pulm clearance. Dr. Adriana Simas office aware, will notify pt   of cancellation and he will get a call from their office in couple weeks to discuss new date.

## 2023-01-22 ENCOUNTER — Other Ambulatory Visit: Payer: Self-pay | Admitting: *Deleted

## 2023-01-22 ENCOUNTER — Other Ambulatory Visit: Payer: Self-pay

## 2023-01-22 NOTE — Patient Outreach (Signed)
Medicaid Managed Care   Nurse Care Manager Note  01/22/2023 Name:  Kevin [name] MRN:  [id number] DOB:  November 24, 1955  Kevin [name] is an 67 y.o. year old male who is a primary patient of [name], MD.  The [facility] Managed Care Coordination team was consulted for assistance with:    HTN COPD Smoking cessation  Kevin [name] was given information about Medicaid Managed Care Coordination team services today. [name] Patient agreed to services and verbal consent obtained.  Engaged with patient by telephone for follow up visit in response to provider referral for case management and/or care coordination services.   Assessments/Interventions:  Review of past medical history, allergies, medications, health status, including review of consultants reports, laboratory and other test data, was performed as part of comprehensive evaluation and provision of chronic care management services.  SDOH (Social Determinants of Health) assessments and interventions performed: SDOH Interventions    Flowsheet Row Office Visit from 08/08/2022 in [facility] Patient Outreach Telephone from 08/07/2022 in [place] POPULATION HEALTH DEPARTMENT Patient Outreach Telephone from 07/04/2022 in [place] POPULATION HEALTH DEPARTMENT Patient Outreach Telephone from 06/02/2022 in [place] POPULATION HEALTH DEPARTMENT Patient Outreach Telephone from 05/04/2022 in [place] POPULATION HEALTH DEPARTMENT Patient Outreach Telephone from 05/03/2022 in [place] POPULATION HEALTH DEPARTMENT  SDOH Interventions        Food Insecurity Interventions -- Intervention Not Indicated -- -- -- Intervention Not Indicated  Housing Interventions -- Intervention Not Indicated -- Intervention Not Indicated -- --  Transportation Interventions -- -- Payor Benefit  [Provided with [phone]] Intervention Not Indicated -- Payor Benefit  [employer]  [phone]  Utilities Interventions -- Other (Comment)  [Advised patient to contact [employer] for increased bill and inquire about financial assistance programs] -- -- -- --  Depression Interventions/Treatment  Patient refuses Treatment -- -- -- -- --  Stress Interventions -- -- -- -- [employer], Provide Counseling --       Care Plan  No Known Allergies  Medications Reviewed Today     Reviewed by [name], RN (Registered Nurse) on 01/22/23 at 1150  Med List Status: <None>   Medication Order Taking? Sig Documenting Provider Last Dose Status Informant  albuterol (VENTOLIN HFA) 108 (90 Base) MCG/ACT inhaler [id number] No Inhale 1-2 puffs into the lungs every 6 (six) hours as needed for wheezing or shortness of breath.  Patient not taking: Reported on 01/04/2023   [name], NP Not Taking Active            Med Note [name], Shamiya Demeritt A   Mon Jan 22, 2023 11:47 AM) Needs a refill  amLODipine-valsartan (EXFORGE) 10-320 MG tablet [id number] Yes Take 1 tablet by mouth daily. Corrin Parker, PA-C Taking Active   aspirin EC 81 MG tablet [id number] Yes Take 1 tablet (81 mg total) by mouth daily, swallow whole [name], MD Taking Active            Med Note Littie Deeds, [name]   Tue Dec 05, 2022  3:43 PM)    atorvastatin (LIPITOR) 80 MG tablet [id number] Yes Take 1 tablet (80 mg total) by mouth daily.  Taking Active            Med Note Littie Deeds, [name]   Tue Dec 05, 2022  3:43 PM)    azithromycin (ZITHROMAX) 250 MG tablet [id number] No Take 1 tablet (250 mg total) by mouth daily.  Take first 2 tablets together, then 1 every day until finished.  Patient not taking: Reported on 01/22/2023   Mardella Layman, MD Not Taking Active            Med Note [name], Rayder Sullenger A   Mon Jan 22, 2023 11:48 AM) completed  busPIRone (BUSPAR) 7.5 MG tablet [id number] Yes Take 1 tablet (7.5 mg total) by mouth 2 (two) times daily. [name], MD Taking Active             Med Note [name] A   Mon Jan 22, 2023 11:48 AM)    dolutegravir-lamiVUDine (DOVATO) 50-300 MG tablet [id number] Yes Take 1 tablet by mouth daily. [name], [name], MD Taking Active Self  Fluticasone-Umeclidin-Vilant [facility] ELLIPTA) 200-62.5-25 MCG/ACT AEPB [id number] Yes Inhale 1 puff into the lungs daily.  Taking Active            Med Note Littie Deeds, [name]   Tue Dec 05, 2022  3:42 PM)    hydrALAZINE (APRESOLINE) 100 MG tablet [id number] Yes Take 1 tablet (100 mg total) by mouth 3 (three) times daily. Rai, Delene Ruffini, MD Taking Active   HYDROcodone-acetaminophen (NORCO) 7.5-325 MG tablet [id number] No Take 1 tablet by mouth every 6 (six) hours as needed for moderate pain (pain score 4-6).  Patient not taking: Reported on 01/22/2023   Mardella Layman, MD Not Taking Active   montelukast (SINGULAIR) 10 MG tablet [id number] Yes Take 1 tablet (10 mg total) by mouth at bedtime.  Taking Active   naloxone (NARCAN) nasal spray 4 mg/0.1 mL [id number] No Call 911. Spray contents of 1 spray into one nostril. Repeat in 2-3 min if symptoms of opioid emergency persists; alternate nostrils  Patient not taking: Reported on 01/22/2023    Not Taking Active            Med Note Neil Crouch, [name]   Thu Dec 21, 2022  6:54 PM) Active prescription, no recent need/use  nebivolol (BYSTOLIC) 5 MG tablet [id number] Yes Take 1 tablet (5 mg total) by mouth daily.  Taking Active            Med Note Littie Deeds, [name]   Tue Dec 05, 2022  3:44 PM)    predniSONE (DELTASONE) 20 MG tablet [id number] No Take 2 tablets (40 mg total) by mouth daily.  Patient not taking: Reported on 01/22/2023   Mardella Layman, MD Not Taking Active   triamcinolone cream (KENALOG) 0.1 % [id number] Yes Apply 1 Application topically 2 (two) times daily. 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   Acute respiratory failure with hypoxia (HCC)    Callus of foot 07/10/2018   CAD (coronary artery disease) 10/19/2016   Easy bruising 08/11/2016   Claudication in peripheral vascular disease (HCC) 06/05/2016   CKD stage 3b, GFR 30-44 ml/min (HCC) - baseline SCr 1.8-2.2  Peripheral arterial disease (HCC) 05/09/2016   OSA (obstructive sleep apnea) 03/24/2016   Essential hypertension 02/18/2016   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   Blurry vision, bilateral    HIV (human immunodeficiency virus infection) (HCC) 08/27/2004    Conditions to be addressed/monitored per PCP order:  HTN, COPD, and Tobacco Use  Care Plan : RN Care Manager Plan of Care  Updates made by [name], RN since 01/22/2023 12:00 AM     Problem: Health Management needs related to COPD      Long-Range Goal: Development of Plan of Care to address Health Management needs related to COPD   Start Date: 05/03/2022  Expected End Date: 02/13/2023  Note:   Current Barriers:  Chronic Disease Management support and education needs related to HTN and COPD-Patient unsure of all of his medications. He is receiving 3 medications in a bubble pack, but unsure of where they are coming from. Reports feeling better and improved  BP.  RNCM Clinical Goal(s):  Patient will verbalize understanding of plan for management of HTN and COPD as evidenced by patient reports take all medications exactly as prescribed and will call provider for medication related questions as evidenced by patient reports    attend all scheduled medical appointments: 12/26 with [facility] Clinic, Pulmonology on 02/27/23  as evidenced by provider documentation        through collaboration with RN Care manager, provider, and care team.   Interventions: Inter-disciplinary care team collaboration (see longitudinal plan of care) Evaluation of current treatment plan related to  self management and patient's adherence to plan as established by provider Provided therapeutic listening Discussed Opthalmology appointment on 01/01/23, patient scheduled for follow up in December Discussed Colonoscopy will be rescheduled upon shingles healing completely and clearance by Pulmonology   Hypertension Interventions:  (Status:  Goal on track:  Yes.) Long Term Goal-Patient reports BP at home 160/80 Last practice recorded BP readings:  BP Readings from Last 3 Encounters:  01/13/23 (!) 198/100  01/04/23 (!) 193/112  01/02/23 (!) 155/86   Most recent eGFR/CrCl:  Lab Results  Component Value Date   EGFR 36 (L) 07/06/2022    No components found for: "CRCL"  Evaluation of current treatment plan related to hypertension self management and patient's adherence to plan as established by provider Reviewed medications with patient and discussed importance of compliance Discussed complications of poorly controlled blood pressure such as heart disease, stroke, circulatory complications, vision complications, kidney impairment, sexual dysfunction Assessed social determinant of health barriers Advised patient to take all medications to all provider appointments Discussed the importance of smoking cessation, improving diet and exercise in controlling HTN Reviewed appointment for  Nephrology per referral with [facility] [phone], 02/28/23 at 10am Explained the importance of taking medications at the same time each day and checking BP 1-2 hours after taking medications Reviewed follow up with HTN clinic on 02/08/23  Smoking Cessation Interventions:  (Status:  Goal on track:  Yes.) Long Term Goal patient has not smoked in "a couple of days" Reviewed smoking history:  tobacco abuse of >20 years; currently smoking 1-2 ppd Previous quit attempts, unsuccessful several /successful using none  Reports smoking within 30 minutes of waking up Reports triggers to smoke include: stress Reports motivation to quit  smoking includes: family and health On a scale of 1-10, reports MOTIVATION to quit is 10 On a scale of 1-10, reports CONFIDENCE in quitting is 10  Evaluation of current treatment plan reviewed Provided patient with printed smoking cessation educational materials Provided contact information for [place] [facility]uit [facility] (1-800-QUIT-NOW) Assessed social determinant of health barriers   COPD: (Status: Goal on Track (progressing): YES.) Long Term Goal  Reviewed medications with patient Provided instruction about proper use of medications used for management of COPD including inhalers Advised patient to self assesses COPD action plan zone and make appointment with provider if in the yellow zone for 48 hours without improvement Advised patient to engage in light exercise as tolerated 3-5 days a week to aid in the the management of COPD Provided education about and advised patient to utilize infection prevention strategies to reduce risk of respiratory infection Discussed the importance of adequate rest and management of fatigue with COPD Assessed social determinant of health barriers Reviewed sleep study evaluation and discussed with patient-waiting to be scheduled for new mask fitting-advised patient to reach out to [facility] regarding new mask Advised patient to continue to work  on smoking cessation  Patient Goals/Self-Care Activities: Take medications as prescribed   Attend all scheduled provider appointments Call pharmacy for medication refills 3-7 days in advance of running out of medications Call provider office for new concerns or questions  Work with the social worker to address care coordination needs and will continue to work with the clinical team to address health care and disease management related needs identify and avoid work-related triggers develop a rescue plan develop a new routine to improve sleep get at least 7 to 8 hours of sleep at night       Follow Up:  Patient agrees to Care Plan and Follow-up.  Plan: The Managed Medicaid care management team will reach out to the patient again over the next 30 days.  Date/time of next scheduled RN care management/care coordination outreach:  12/22/22 at 11:15am  [name] RN, BSN [place]  [facility] RN Care Coordinator [phone]

## 2023-01-22 NOTE — Patient Instructions (Signed)
Visit Information  [name] was given information about Medicaid Managed Care team care coordination services as a part of their Healthy [facility] Medicaid benefit. Kevin [name] verbally consented to engagement with the [facility] Managed Care team.   If you are experiencing a medical emergency, please call 911 or report to your local emergency department or urgent care.   If you have a non-emergency medical problem during routine business hours, please contact your provider's office and ask to speak with a nurse.   For questions related to your Healthy [facility] health plan, please call: [phone] or visit the homepage here: [email or web address]  If you would like to schedule transportation through your Healthy [facility] plan, please call the following number at least 2 days in advance of your appointment: [phone]  For information about your ride after you set it up, call Ride Assist at [phone]. Use this number to activate a Will Call pickup, or if your transportation is late for a scheduled pickup. Use this number, too, if you need to make a change or cancel a previously scheduled reservation.  If you need transportation services right away, call [phone]. The after-hours call center is staffed 24 hours to handle ride assistance and urgent reservation requests (including discharges) 365 days a year. Urgent trips include sick visits, hospital discharge requests and life-sustaining treatment.  Call the [facility] Line at [phone], at any time, 24 hours a day, 7 days a week. If you are in danger or need immediate medical attention call 911.  If you would like help to quit smoking, call 1-800-QUIT-NOW (863 566 9804) OR Espaol: 1-855-Djelo-Ya (4-742-595-6387) o para ms informacin haga clic aqu or Text READY to 564-332 to register via text  Kevin [name],   Please see education materials related to shingles provided by  MyChart link.  Patient verbalizes understanding of instructions and care plan provided today and agrees to view in MyChart. Active MyChart status and patient understanding of how to access instructions and care plan via MyChart confirmed with patient.     Telephone follow up appointment with Managed Medicaid care management team member scheduled for:02/21/23 at 11:15am  [name] RN, BSN [place]  [facility] RN Care Coordinator [phone]   Following is a copy of your plan of care:  Care Plan : RN Care Manager Plan of Care  Updates made by [name], RN since 01/22/2023 12:00 AM     Problem: Health Management needs related to COPD      Long-Range Goal: Development of Plan of Care to address Health Management needs related to COPD   Start Date: 05/03/2022  Expected End Date: 02/13/2023  Note:   Current Barriers:  Chronic Disease Management support and education needs related to HTN and COPD-Patient unsure of all of his medications. He is receiving 3 medications in a bubble pack, but unsure of where they are coming from. Reports feeling better and improved BP.  RNCM Clinical Goal(s):  Patient will verbalize understanding of plan for management of HTN and COPD as evidenced by patient reports take all medications exactly as prescribed and will call provider for medication related questions as evidenced by patient reports    attend all scheduled medical appointments: 12/26 with HTN Clinic, Pulmonology on 02/27/23  as evidenced by provider documentation        through collaboration with RN Care manager, provider, and care team.   Interventions: Inter-disciplinary care team collaboration (see longitudinal plan of care) Evaluation of current treatment  plan related to  self management and patient's adherence to plan as established by provider Provided therapeutic listening Discussed Opthalmology appointment on 01/01/23, patient scheduled for  follow up in December Discussed Colonoscopy will be rescheduled upon shingles healing completely and clearance by Pulmonology   Hypertension Interventions:  (Status:  Goal on track:  Yes.) Long Term Goal-Patient reports BP at home 160/80 Last practice recorded BP readings:  BP Readings from Last 3 Encounters:  01/13/23 (!) 198/100  01/04/23 (!) 193/112  01/02/23 (!) 155/86   Most recent eGFR/CrCl:  Lab Results  Component Value Date   EGFR 36 (L) 07/06/2022    No components found for: "CRCL"  Evaluation of current treatment plan related to hypertension self management and patient's adherence to plan as established by provider Reviewed medications with patient and discussed importance of compliance Discussed complications of poorly controlled blood pressure such as heart disease, stroke, circulatory complications, vision complications, kidney impairment, sexual dysfunction Assessed social determinant of health barriers Advised patient to take all medications to all provider appointments Discussed the importance of smoking cessation, improving diet and exercise in controlling HTN Reviewed appointment for Nephrology per referral with [facility] [phone], 02/28/23 at 10am Explained the importance of taking medications at the same time each day and checking BP 1-2 hours after taking medications Reviewed follow up with HTN clinic on 02/08/23  Smoking Cessation Interventions:  (Status:  Goal on track:  Yes.) Long Term Goal patient has not smoked in "a couple of days" Reviewed smoking history:  tobacco abuse of >20 years; currently smoking 1-2 ppd Previous quit attempts, unsuccessful several /successful using none  Reports smoking within 30 minutes of waking up Reports triggers to smoke include: stress Reports motivation to quit smoking includes: family and health On a scale of 1-10, reports MOTIVATION to quit is 10 On a scale of 1-10, reports CONFIDENCE in quitting is  10  Evaluation of current treatment plan reviewed Provided patient with printed smoking cessation educational materials Provided contact information for [place] Quit Line (1-800-QUIT-NOW) Assessed social determinant of health barriers   COPD: (Status: Goal on Track (progressing): YES.) Long Term Goal  Reviewed medications with patient Provided instruction about proper use of medications used for management of COPD including inhalers Advised patient to self assesses COPD action plan zone and make appointment with provider if in the yellow zone for 48 hours without improvement Advised patient to engage in light exercise as tolerated 3-5 days a week to aid in the the management of COPD Provided education about and advised patient to utilize infection prevention strategies to reduce risk of respiratory infection Discussed the importance of adequate rest and management of fatigue with COPD Assessed social determinant of health barriers Reviewed sleep study evaluation and discussed with patient-waiting to be scheduled for new mask fitting-advised patient to reach out to [facility] regarding new mask Advised patient to continue to work on smoking cessation  Patient Goals/Self-Care Activities: Take medications as prescribed   Attend all scheduled provider appointments Call pharmacy for medication refills 3-7 days in advance of running out of medications Call provider office for new concerns or questions  Work with the social worker to address care coordination needs and will continue to work with the clinical team to address health care and disease management related needs identify and avoid work-related triggers develop a rescue plan develop a new routine to improve sleep get at least 7 to 8 hours of sleep at night

## 2023-01-23 ENCOUNTER — Other Ambulatory Visit (HOSPITAL_COMMUNITY): Payer: Self-pay

## 2023-01-23 ENCOUNTER — Ambulatory Visit (HOSPITAL_COMMUNITY): Admission: RE | Admit: 2023-01-23 | Payer: 59 | Source: Home / Self Care | Admitting: Gastroenterology

## 2023-01-23 SURGERY — COLONOSCOPY WITH PROPOFOL
Anesthesia: Monitor Anesthesia Care

## 2023-01-24 ENCOUNTER — Other Ambulatory Visit: Payer: Self-pay

## 2023-01-25 ENCOUNTER — Other Ambulatory Visit: Payer: Self-pay

## 2023-01-25 ENCOUNTER — Other Ambulatory Visit (HOSPITAL_COMMUNITY): Payer: Self-pay

## 2023-01-25 ENCOUNTER — Other Ambulatory Visit (HOSPITAL_COMMUNITY): Payer: Self-pay | Admitting: Pharmacy Technician

## 2023-01-25 NOTE — Progress Notes (Signed)
Specialty Pharmacy Refill Coordination Note  Kevin [name] is a 67 y.o. male contacted today regarding refills of specialty medication(s) Dolutegravir-lamiVUDine (Dovato)   Patient requested Delivery   Delivery date: 01/29/23   Verified address: [address] [name]   Medication will be filled on 01/26/23.

## 2023-01-26 ENCOUNTER — Other Ambulatory Visit (HOSPITAL_COMMUNITY): Payer: Self-pay

## 2023-01-26 ENCOUNTER — Other Ambulatory Visit: Payer: Self-pay

## 2023-01-29 ENCOUNTER — Other Ambulatory Visit (HOSPITAL_COMMUNITY): Payer: Self-pay

## 2023-01-30 ENCOUNTER — Other Ambulatory Visit: Payer: Self-pay

## 2023-01-31 DIAGNOSIS — G453 Amaurosis fugax: Secondary | ICD-10-CM | POA: Diagnosis not present

## 2023-01-31 DIAGNOSIS — H25813 Combined forms of age-related cataract, bilateral: Secondary | ICD-10-CM | POA: Diagnosis not present

## 2023-01-31 DIAGNOSIS — H5347 Heteronymous bilateral field defects: Secondary | ICD-10-CM | POA: Diagnosis not present

## 2023-01-31 DIAGNOSIS — H35033 Hypertensive retinopathy, bilateral: Secondary | ICD-10-CM | POA: Diagnosis not present

## 2023-02-01 ENCOUNTER — Telehealth: Payer: Self-pay

## 2023-02-01 NOTE — Telephone Encounter (Signed)
Copied from CRM [id number]. Topic: Clinical - Medical Advice >> Feb 01, 2023  9:29 AM Efraim Kaufmann C wrote: Reason for CRM: patient stated his eye doctor has been trying to contact PCP and has not heard back. Patient also stated that he had an appointment 12/19/2022 and he though he was supposed to have a follow up one month after but wasn't sure. Would like MD to call back regarding these issues.

## 2023-02-02 ENCOUNTER — Telehealth: Payer: Self-pay

## 2023-02-02 NOTE — Telephone Encounter (Signed)
Spoke with patient and he stated that his eye doctor is [facility] in [place]. I advised him I would reach out to them on Monday when they are open to follow up.

## 2023-02-02 NOTE — Telephone Encounter (Signed)
Copied from CRM [id number]. Topic: General - Other >> Feb 02, 2023  1:24 PM Corin V wrote: Reason for CRM: Earley Abide, NP, with [facility]. She did a PAD test on patient last night and got abnormal results. Right leg was 0.30 and left leg as 0.36. This indicates moderate circulation problems in both legs. She also checked his blood pressure multiple times and it as high every time. 2 readings were 177/106 and 150/90 but patient had no complaints.

## 2023-02-02 NOTE — Telephone Encounter (Signed)
Please advise in Dr. Biagio Borg absence.

## 2023-02-02 NOTE — Telephone Encounter (Signed)
Patient is aware of annotation below and verbalized understanding. Scheduled appointment with Salvatore Decent, FNP on 02/05/2023 at 3pm due to no availability today.

## 2023-02-05 ENCOUNTER — Ambulatory Visit: Payer: 59 | Admitting: Internal Medicine

## 2023-02-05 ENCOUNTER — Encounter: Payer: Self-pay | Admitting: Internal Medicine

## 2023-02-05 VITALS — BP 156/82 | HR 62 | Temp 98.7°F | Ht 72.0 in | Wt 157.8 lb

## 2023-02-05 DIAGNOSIS — G709 Myoneural disorder, unspecified: Secondary | ICD-10-CM

## 2023-02-05 DIAGNOSIS — I739 Peripheral vascular disease, unspecified: Secondary | ICD-10-CM | POA: Diagnosis not present

## 2023-02-05 DIAGNOSIS — B0229 Other postherpetic nervous system involvement: Secondary | ICD-10-CM

## 2023-02-05 HISTORY — DX: Myoneural disorder, unspecified: G70.9

## 2023-02-05 MED ORDER — HYDROCODONE-ACETAMINOPHEN 7.5-325 MG PO TABS: 1.0000 | ORAL_TABLET | Freq: Four times a day (QID) | ORAL | 0 refills | Status: DC | PRN

## 2023-02-05 NOTE — Progress Notes (Signed)
[facility] [address] [place] [address] Dept: [phone] Dept Fax: [phone]  Acute Care Office Visit  Subjective:   Kevin [name] 1955-04-22 02/05/2023  Chief Complaint  Patient presents with   Hypertension   Poor Circulation    HPI: Discussed the use of AI scribe software for clinical note transcription with the patient, who gave verbal consent to proceed.  History of Present Illness   The patient, with a history of hypertension, PAD, and COPD, presents with concerns of worsening high blood pressure and ongoing pain from shingles. Patient had home health evaluation last week, which noted his blood pressure to be elevated and decreased circulation in LE's. RN advised patient to be seen by his primary care this week. Patient has a history of PAD and last vascular ultrasound was done in July 2024. They report their blood pressure typically runs in the 150s-170s, which they consider decent for them as they have always been in the 200s.  He is compliant with his blood pressure medication regimen.  He is on amlodipine-valsartan 10-320 mg p.o. daily, hydralazine 100 mg 3 times daily, and Bystolic 5 mg p.o. daily. He does smoke cigarettes.   They also report severe pain from shingles (recent dx 1 month ago), describing it as feeling like they are "on fire." The shingles rash is located on their testicles and left inner thigh. They have been using a steroid cream and pain medication, both of which were prescribed at an urgent care visit. The pain medication was only a five-day supply, and they report it helped a lot. They deny any relief from Tylenol and Gabapentin.    The following portions of the patient's history were reviewed and updated as appropriate: past medical history, past surgical history, family history, social history, allergies, medications, and problem list.   Patient Active Problem List   Diagnosis Date  Noted   COPD exacerbation (HCC) 12/21/2022   Anxiety 12/19/2022   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   Skin nodule 08/08/2022   Lung nodule, multiple 07/26/2022   Lipoma 07/06/2022   Chronic low back pain 04/29/2022   Chronic heart failure with preserved ejection fraction (HFpEF) (HCC) 12/27/2021   HLD (hyperlipidemia) 12/27/2021   Right renal mass 06/29/2021   Renal lesion 06/29/2021   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   Acute respiratory failure with hypoxia (HCC)    Callus of foot 07/10/2018   CAD (coronary artery disease) 10/19/2016   Easy bruising 08/11/2016   Claudication in peripheral vascular disease (HCC) 06/05/2016   CKD stage 3b, GFR 30-44 ml/min (HCC) - baseline SCr 1.8-2.2    Peripheral arterial disease (HCC) 05/09/2016   OSA (obstructive sleep apnea) 03/24/2016   Essential hypertension 02/18/2016   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   Blurry vision, bilateral    HIV (human immunodeficiency virus infection) (HCC) 08/27/2004   Past Medical History:  Diagnosis Date   AIDS (acquired immune deficiency syndrome) (HCC) 08/17/2016   Amaurosis fugax 08/18/2022   Chronic diastolic CHF (congestive heart failure), NYHA class 3 (HCC) 01/2016   Chronic lower back pain    CKD (chronic kidney disease), stage III (HCC)    COPD (chronic obstructive pulmonary disease) (HCC)    Gout    "  forearms, hands, ankles, feet" (06/05/2016)   Headache    "weekly" (06/05/2016)   Heart murmur    Hypertension    Hypertensive crisis 08/15/2017   Lipoma 07/06/2022   OSA on CPAP    PAD (peripheral artery disease) (HCC)    PAF (paroxysmal atrial fibrillation) (HCC) 01/2016   Papillary renal cell carcinoma  (HCC) 06/15/2021   Past Surgical History:  Procedure Laterality Date   IR RADIOLOGIST EVAL & MGMT  05/31/2021   IR RADIOLOGIST EVAL & MGMT  07/26/2021   LEFT HEART CATH AND CORONARY ANGIOGRAPHY N/A 10/23/2016   Procedure: LEFT HEART CATH AND CORONARY ANGIOGRAPHY;  Surgeon: Marykay Lex, MD;  Location: [facility] INVASIVE CV LAB;  Service: Cardiovascular;  Laterality: N/A;   LOWER EXTREMITY ANGIOGRAPHY N/A 07/17/2016   Procedure: Lower Extremity Angiography;  Surgeon: Runell Gess, MD;  Location: [facility] INVASIVE CV LAB;  Service: Cardiovascular;  Laterality: N/A;   LOWER EXTREMITY INTERVENTION N/A 06/05/2016   Procedure: Lower Extremity Intervention;  Surgeon: Runell Gess, MD;  Location[facility] INVASIVE CV LAB;  Service: Cardiovascular;  Laterality: N/A;   PERIPHERAL VASCULAR ATHERECTOMY Right 07/17/2016   Procedure: Peripheral Vascular Atherectomy;  Surgeon: Runell Gess, MD;  Location: [facility] INVASIVE CV LAB;  Service: Cardiovascular;  Laterality: Right;  SFA   PERIPHERAL VASCULAR INTERVENTION  06/05/2016   Procedure: Peripheral Vascular Intervention;  Surgeon: Runell Gess, MD;  Location: [facility] INVASIVE CV LAB;  Service: Cardiovascular;;  left SFA   RADIOLOGY WITH ANESTHESIA Right 06/29/2021   Procedure: RIGHT RENAL CRYOABLATION;  Surgeon: Richarda Overlie, MD;  Location: [facility] ORS;  Service: Anesthesiology;  Laterality: Right;   Family History  Problem Relation Age of Onset   High blood pressure Mother    Lupus Mother    Seizures Daughter    Heart attack Daughter     Current Outpatient Medications:    amLODipine-valsartan (EXFORGE) 10-320 MG tablet, Take 1 tablet by mouth daily., Disp: 30 tablet, Rfl: 5   aspirin EC 81 MG tablet, Take 1 tablet (81 mg total) by mouth daily, swallow whole, Disp: 30 tablet, Rfl: 12   atorvastatin (LIPITOR) 80 MG tablet, Take 1 tablet (80 mg total) by mouth daily., Disp: 30 tablet, Rfl: 3   busPIRone (BUSPAR) 7.5 MG tablet, Take 1 tablet (7.5 mg total) by mouth 2 (two)  times daily., Disp: 180 tablet, Rfl: 0   dolutegravir-lamiVUDine (DOVATO) 50-300 MG tablet, Take 1 tablet by mouth daily., Disp: 30 tablet, Rfl: 11   Fluticasone-Umeclidin-Vilant (TRELEGY ELLIPTA) 200-62.5-25 MCG/ACT AEPB, Inhale 1 puff into the lungs daily., Disp: 60 each, Rfl: 5   hydrALAZINE (APRESOLINE) 100 MG tablet, Take 1 tablet (100 mg total) by mouth 3 (three) times daily., Disp: 90 tablet, Rfl: 5   montelukast (SINGULAIR) 10 MG tablet, Take 1 tablet (10 mg total) by mouth at bedtime., Disp: 30 tablet, Rfl: 5   nebivolol (BYSTOLIC) 5 MG tablet, Take 1 tablet (5 mg total) by mouth daily., Disp: 30 tablet, Rfl: 6   triamcinolone cream (KENALOG) 0.1 %, Apply 1 Application topically 2 (two) times daily. Apply a small amount twice daily to the affected areas for 10 days and then discontinue, Disp: 80 g, Rfl: 0   albuterol (VENTOLIN HFA) 108 (90 Base) MCG/ACT inhaler, Inhale 1-2 puffs into the lungs every 6 (six) hours as needed for wheezing or shortness of breath. (Patient not taking: Reported on 01/04/2023), Disp: 18 g, Rfl: 0   azithromycin (ZITHROMAX) 250 MG tablet, Take 1 tablet (250 mg total)  by mouth daily. Take first 2 tablets together, then 1 every day until finished. (Patient not taking: Reported on 02/05/2023), Disp: 6 tablet, Rfl: 0   HYDROcodone-acetaminophen (NORCO) 7.5-325 MG tablet, Take 1 tablet by mouth every 6 (six) hours as needed for moderate pain (pain score 4-6)., Disp: 20 tablet, Rfl: 0   naloxone (NARCAN) nasal spray 4 mg/0.1 mL, Call 911. Spray contents of 1 spray into one nostril. Repeat in 2-3 min if symptoms of opioid emergency persists; alternate nostrils (Patient not taking: Reported on 02/05/2023), Disp: 2 each, Rfl: 0 No Known Allergies   ROS: A complete ROS was performed with pertinent positives/negatives noted in the HPI. The remainder of the ROS are negative.    Objective:   Today's Vitals   02/05/23 1500 02/05/23 1538  BP: (!) 160/100 (!) 156/82  Pulse: 62    Temp: 98.7 F (37.1 C)   TempSrc: Temporal   SpO2: 99%   Weight: 157 lb 12.8 oz (71.6 kg)   Height: 6' (1.829 m)     GENERAL: Well-appearing, in NAD. Well nourished.  SKIN: Pink, warm and dry.  dry non-vesicular rash to left inner thigh and groin  RESPIRATORY: Chest wall symmetrical. Respirations even and non-labored. Breath sounds clear to auscultation bilaterally.  CARDIAC: S1, S2 present, regular rate and rhythm. Peripheral pulses 2+ bilaterally.  EXTREMITIES: Without clubbing, cyanosis, or edema.  NEUROLOGIC: Steady, even gait.  PSYCH/MENTAL STATUS: Alert, oriented x 3. Cooperative, appropriate mood and affect.    No results found for any visits on 02/05/23.    Assessment & Plan:  Assessment and Plan    Post Herpetic Neuralgia Persistent pain and rash in the perineal region. Previously treated with topical steroid cream and short course of hydrocodone for pain. Gabapentin was not effective for pain management. - Prescribe short course of hydrocodone for pain management. - Reevaluate if pain persists for potential long-term pain management.  Hypertension -continue current regimen -Follow-up with cardiology as scheduled on 02/08/2023 -Likely pain is a contributing factor to a further elevated BP  Peripheral Arterial Disease Chronic condition with recent evaluation by home health nurse.   Meds ordered this encounter  Medications   HYDROcodone-acetaminophen (NORCO) 7.5-325 MG tablet    Sig: Take 1 tablet by mouth every 6 (six) hours as needed for moderate pain (pain score 4-6).    Dispense:  20 tablet    Refill:  0    Supervising Provider:   [name] [[id number]]   No orders of the defined types were placed in this encounter.  Lab Orders  No laboratory test(s) ordered today   No images are attached to the encounter or orders placed in the encounter.  Return if symptoms worsen or fail to improve.   Salvatore Decent, FNP

## 2023-02-05 NOTE — Telephone Encounter (Signed)
Spoke with [facility] care and they stated that their provider was trying to fax over OV, but didn't have PCP updated Office fax number. I updated them with our fax here and she stated she would refax it to [name] at [phone].

## 2023-02-06 ENCOUNTER — Other Ambulatory Visit: Payer: Self-pay

## 2023-02-06 ENCOUNTER — Other Ambulatory Visit: Payer: Self-pay | Admitting: Nurse Practitioner

## 2023-02-08 ENCOUNTER — Ambulatory Visit (HOSPITAL_BASED_OUTPATIENT_CLINIC_OR_DEPARTMENT_OTHER): Payer: 59 | Admitting: Family

## 2023-02-08 ENCOUNTER — Other Ambulatory Visit: Payer: Self-pay

## 2023-02-08 ENCOUNTER — Encounter (HOSPITAL_BASED_OUTPATIENT_CLINIC_OR_DEPARTMENT_OTHER): Payer: Self-pay | Admitting: Family

## 2023-02-08 ENCOUNTER — Other Ambulatory Visit (HOSPITAL_COMMUNITY): Payer: Self-pay

## 2023-02-08 VITALS — BP 178/98 | HR 55 | Ht 72.0 in | Wt 161.4 lb

## 2023-02-08 DIAGNOSIS — G4733 Obstructive sleep apnea (adult) (pediatric): Secondary | ICD-10-CM | POA: Diagnosis not present

## 2023-02-08 DIAGNOSIS — I25118 Atherosclerotic heart disease of native coronary artery with other forms of angina pectoris: Secondary | ICD-10-CM | POA: Diagnosis not present

## 2023-02-08 DIAGNOSIS — E785 Hyperlipidemia, unspecified: Secondary | ICD-10-CM

## 2023-02-08 DIAGNOSIS — J449 Chronic obstructive pulmonary disease, unspecified: Secondary | ICD-10-CM | POA: Diagnosis not present

## 2023-02-08 DIAGNOSIS — I10 Essential (primary) hypertension: Secondary | ICD-10-CM | POA: Diagnosis not present

## 2023-02-08 MED ORDER — ISOSORBIDE MONONITRATE ER 60 MG PO TB24
60.0000 mg | ORAL_TABLET | Freq: Every day | ORAL | 5 refills | Status: DC
  Filled 2023-02-08 – 2023-02-20 (×3): qty 30, 30d supply, fill #0
  Filled 2023-03-12 – 2023-03-15 (×2): qty 30, 30d supply, fill #1
  Filled 2023-03-29 – 2023-04-11 (×3): qty 30, 30d supply, fill #2
  Filled 2023-04-19 – 2023-05-07 (×3): qty 30, 30d supply, fill #3
  Filled 2023-05-30 – 2023-05-31 (×2): qty 30, 30d supply, fill #4

## 2023-02-08 MED ORDER — NEBIVOLOL HCL 5 MG PO TABS
5.0000 mg | ORAL_TABLET | Freq: Every day | ORAL | 5 refills | Status: DC
  Filled 2023-02-08 – 2023-02-20 (×3): qty 30, 30d supply, fill #0
  Filled 2023-03-12 – 2023-03-15 (×2): qty 30, 30d supply, fill #1
  Filled 2023-03-29 – 2023-04-11 (×3): qty 30, 30d supply, fill #2
  Filled 2023-04-19 – 2023-05-07 (×3): qty 30, 30d supply, fill #3
  Filled 2023-05-30 – 2023-05-31 (×2): qty 30, 30d supply, fill #4
  Filled 2023-06-22 – 2023-06-28 (×3): qty 30, 30d supply, fill #5

## 2023-02-08 MED ORDER — ATORVASTATIN CALCIUM 80 MG PO TABS
80.0000 mg | ORAL_TABLET | Freq: Every day | ORAL | 1 refills | Status: DC
  Filled 2023-02-08 – 2023-02-12 (×2): qty 90, 90d supply, fill #0
  Filled 2023-02-20: qty 30, 30d supply, fill #0
  Filled 2023-03-12 – 2023-03-15 (×2): qty 30, 30d supply, fill #1
  Filled 2023-03-29 – 2023-04-11 (×3): qty 30, 30d supply, fill #2
  Filled 2023-04-19 – 2023-05-07 (×3): qty 30, 30d supply, fill #3
  Filled 2023-05-30 – 2023-05-31 (×2): qty 30, 30d supply, fill #4
  Filled 2023-06-22 – 2023-06-28 (×3): qty 30, 30d supply, fill #5

## 2023-02-08 MED ORDER — HYDRALAZINE HCL 100 MG PO TABS
100.0000 mg | ORAL_TABLET | Freq: Three times a day (TID) | ORAL | 5 refills | Status: DC
  Filled 2023-02-08 – 2023-02-20 (×3): qty 90, 30d supply, fill #0
  Filled 2023-03-12 – 2023-03-15 (×2): qty 90, 30d supply, fill #1

## 2023-02-08 MED ORDER — ALBUTEROL SULFATE HFA 108 (90 BASE) MCG/ACT IN AERS
1.0000 | INHALATION_SPRAY | Freq: Four times a day (QID) | RESPIRATORY_TRACT | 0 refills | Status: DC | PRN
  Filled 2023-02-08: qty 6.7, 25d supply, fill #0

## 2023-02-08 NOTE — Patient Instructions (Signed)
Medication Instructions:  Your physician has recommended you make the following change in your medication:   START Imdur 60 mg daily   Follow-Up: Follow up in Hypertension Clinic in 2 to 3 months.

## 2023-02-08 NOTE — Progress Notes (Signed)
Advanced Hypertension Clinic Assessment:    Date:  02/08/2023   ID:  Kevin [name], DOB 1955-11-25, MRN [id number]  PCP:  [name], MD  Cardiologist:  Chrystie Nose, MD  Nephrologist:  Referring MD: [name], MD   CC: Hypertension  History of Present Illness:    Kevin [name] is a 67 y.o. male with a hx of HTN, HLD, COPD, OSA on CPAP, HIV, PAD s/p left SFA intervention 07/2016, CKDIIIb, CAD. Here to follow up in the Advanced Hypertension Clinic.   Follows with Dr. Rennis Golden for general cardiology. Previous cardiac workup includes: Echo 12/2021 LVEF 60-65%, moderate LVH, gr1DD, mildly elevated PASP, no significant valvular abnormalities. Carotid duplex 08/2022 mild nonobstructive 1-39% stenosis bilaterally. Myoview 09/26/22 no ischemia, LVEF 43%.   Established with Advanced Hypertension Clinic 10/05/22. Kevin [name] was diagnosed with hypertension more than 10 years ago. It has been difficult to control particularly with compliance. Smoking 1-2 cigarettes per day. No formal exercise routine, not routinely following low sodium diet.   At initial visit 10/05/22 Amlodipine and valsartan were consolidated to Amlodipine-Valsartan 10-320mg  daily. Hdyralazine adjusted to 100mg  BID dosing for better adherence. BP noted to be nearly 50 points higher in L arm - had CT neck upcoming per neurology. Prior carotid duplex 08/2022 bilateral 1-39% stenosis. Was encouraged to resume using noninvasive ventilator at bedtime. Medications sent to [facility] for adherence packaging and free mail delivery.   Admitted 8/29-8/30/24 with COPD exacerbation treated with Prednisone and abx. Hydralazine was increased to 100mg  TID.   Saw general cardiology team and Lopressor was transitioned to Nebivolol 5mg  daily.  CT angio head/neck 10/25/22 mild soft plaque in thoracic aorta, no appreciable hemodynamically significant prox subclavian artery stenosis, mild plaque bilateral ICA but no significant stenosis,  patent vertebral arteries.   Seen 11/2022 with home BP 160s.  Wearing noninvasive ventilator at night. Med list clarified with [facility] outpatient pharmacy and Hydralazine 100mg  TID, Amlodipine-Valsartan 10-320mg  daily, Nebivolol 5mg  daily delivered.   ED visit 12/06/22 with COPD exacerbation treated with doxycycline. Admitted 11/7-11/8/24 with COPD exacerbation. Repeat ED visit 01/04/23 with COPD exacerbation and herpes zoster given abx, prednisone. ED visit 01/13/23 with pain from herpes zoster given triamcinolone ointment and encouraged to follow up with PCP. Notes most often only taking his Hydralazine twice per day.   Presents today for follow up. Reports blood pressure the other day was 156. He did take his medication today. Reports shortness of breath first thing in the morning. Reports he does have Trelegy and is using in the morning. He is out of PRN albuterol. Reports no chest pain, pressure, or tightness. No edema, orthopnea, PND. Reports no palpitations.    Previous antihypertensives: Coreg - transitioned to cardioselective beta blocker  Past Medical History:  Diagnosis Date   AIDS (acquired immune deficiency syndrome) (HCC) 08/17/2016   Amaurosis fugax 08/18/2022   Chronic diastolic CHF (congestive heart failure), NYHA class 3 (HCC) 01/2016   Chronic lower back pain    CKD (chronic kidney disease), stage III (HCC)    COPD (chronic obstructive pulmonary disease) (HCC)    Gout    "forearms, hands, ankles, feet" (06/05/2016)   Headache    "weekly" (06/05/2016)   Heart murmur    Hypertension    Hypertensive crisis 08/15/2017   Lipoma 07/06/2022   OSA on CPAP    PAD (peripheral artery disease) (HCC)    PAF (paroxysmal atrial fibrillation) (HCC) 01/2016   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  Past Surgical History:  Procedure Laterality Date   IR RADIOLOGIST EVAL & MGMT  05/31/2021   IR RADIOLOGIST EVAL & MGMT  07/26/2021   LEFT HEART CATH AND CORONARY ANGIOGRAPHY N/A  10/23/2016   Procedure: LEFT HEART CATH AND CORONARY ANGIOGRAPHY;  Surgeon: Marykay Lex, MD;  Location: [facility] INVASIVE CV LAB;  Service: Cardiovascular;  Laterality: N/A;   LOWER EXTREMITY ANGIOGRAPHY N/A 07/17/2016   Procedure: Lower Extremity Angiography;  Surgeon: Runell Gess, MD;  Location: [facility] INVASIVE CV LAB;  Service: Cardiovascular;  Laterality: N/A;   LOWER EXTREMITY INTERVENTION N/A 06/05/2016   Procedure: Lower Extremity Intervention;  Surgeon: Runell Gess, MD;  Location: [facility] INVASIVE CV LAB;  Service: Cardiovascular;  Laterality: N/A;   PERIPHERAL VASCULAR ATHERECTOMY Right 07/17/2016   Procedure: Peripheral Vascular Atherectomy;  Surgeon: Runell Gess, MD;  Location: [facility] INVASIVE CV LAB;  Service: Cardiovascular;  Laterality: Right;  SFA   PERIPHERAL VASCULAR INTERVENTION  06/05/2016   Procedure: Peripheral Vascular Intervention;  Surgeon: Runell Gess, MD;  Location: [facility] INVASIVE CV LAB;  Service: Cardiovascular;;  left SFA   RADIOLOGY WITH ANESTHESIA Right 06/29/2021   Procedure: RIGHT RENAL CRYOABLATION;  Surgeon: Richarda Overlie, MD;  Location: [facility];  Service: Anesthesiology;  Laterality: Right;    Current Medications: Current Meds  Medication Sig   amLODipine-valsartan (EXFORGE) 10-320 MG tablet Take 1 tablet by mouth daily.   aspirin EC 81 MG tablet Take 1 tablet (81 mg total) by mouth daily, swallow whole   busPIRone (BUSPAR) 7.5 MG tablet Take 1 tablet (7.5 mg total) by mouth 2 (two) times daily.   dolutegravir-lamiVUDine (DOVATO) 50-300 MG tablet Take 1 tablet by mouth daily.   Fluticasone-Umeclidin-Vilant (TRELEGY ELLIPTA) 200-62.5-25 MCG/ACT AEPB Inhale 1 puff into the lungs daily.   HYDROcodone-acetaminophen (NORCO) 7.5-325 MG tablet Take 1 tablet by mouth every 6 (six) hours as needed for moderate pain (pain score 4-6).   isosorbide mononitrate (IMDUR) 60 MG 24 hr tablet Take 1 tablet (60 mg total) by mouth daily.   montelukast (SINGULAIR) 10 MG tablet Take 1  tablet (10 mg total) by mouth at bedtime.   naloxone (NARCAN) nasal spray 4 mg/0.1 mL Call 911. Spray contents of 1 spray into one nostril. Repeat in 2-3 min if symptoms of opioid emergency persists; alternate nostrils   triamcinolone cream (KENALOG) 0.1 % Apply 1 Application topically 2 (two) times daily. Apply a small amount twice daily to the affected areas for 10 days and then discontinue   [DISCONTINUED] albuterol (VENTOLIN HFA) 108 (90 Base) MCG/ACT inhaler Inhale 1-2 puffs into the lungs every 6 (six) hours as needed for wheezing or shortness of breath.   [DISCONTINUED] atorvastatin (LIPITOR) 80 MG tablet Take 1 tablet (80 mg total) by mouth daily.   [DISCONTINUED] hydrALAZINE (APRESOLINE) 100 MG tablet Take 1 tablet (100 mg total) by mouth 3 (three) times daily.   [DISCONTINUED] nebivolol (BYSTOLIC) 5 MG tablet Take 1 tablet (5 mg total) by mouth daily.     Allergies:   Patient has no known allergies.   Social History   Socioeconomic History   Marital status: Significant Other    Spouse name: Not on file   Number of children: Not on file   Years of education: Not on file   Highest education level: Not on file  Occupational History   Not on file  Tobacco Use   Smoking status: Every Day    Current packs/day: 0.10    Average packs/day: 0.1 packs/day for  42.0 years (4.2 ttl pk-yrs)    Types: Cigarettes, E-cigarettes    Passive exposure: Never   Smokeless tobacco: Never   Tobacco comments:    1-2 cigarettes a day 09/29/22 [place]  Vaping Use   Vaping status: Every Day   Substances: Nicotine, Flavoring  Substance and Sexual Activity   Alcohol use: Not Currently    Alcohol/week: 2.0 standard drinks of alcohol    Types: 2 Cans of beer per week    Comment: rare   Drug use: Not Currently    Comment: rare   Sexual activity: Not Currently    Comment: declined condoms  Other Topics Concern   Not on file  Social History Narrative   Right handed   Social Drivers of Health    Financial Resource Strain: High Risk (10/05/2022)   Overall Financial Resource Strain (CARDIA)    Difficulty of Paying Living Expenses: Hard  Food Insecurity: No Food Insecurity (12/21/2022)   Hunger Vital Sign    Worried About Running Out of Food in the Last Year: Never true    Ran Out of Food in the Last Year: Never true  Transportation Needs: No Transportation Needs (12/21/2022)   PRAPARE - Administrator, Civil Service (Medical): No    Lack of Transportation (Non-Medical): No  Physical Activity: Inactive (10/05/2022)   Exercise Vital Sign    Days of Exercise per Week: 0 days    Minutes of Exercise per Session: 0 min  Stress: Stress Concern Present (05/04/2022)   [employer] of Occupational Health - Occupational Stress Questionnaire    Feeling of Stress : To some extent  Social Connections: Not on file     Family History: The patient's family history includes Heart attack in his daughter; High blood pressure in his mother; Lupus in his mother; Seizures in his daughter.  ROS:   Please see the history of present illness.    All other systems reviewed and are negative.  EKGs/Labs/Other Studies Reviewed:         Recent Labs: 04/28/2022: Magnesium 2.0; TSH 1.28 10/24/2022: ALT 8 11/18/2022: B Natriuretic Peptide 97.5 12/22/2022: BUN 26; Creatinine, Ser 2.20; Hemoglobin 10.9; Platelets 281; Potassium 4.6; Sodium 137   Recent Lipid Panel    Component Value Date/Time   CHOL 190 10/24/2022 1432   TRIG 79 10/24/2022 1432   HDL 79 10/24/2022 1432   CHOLHDL 2.4 10/24/2022 1432   CHOLHDL 3.5 07/06/2022 1145   VLDL 41 (H) 04/04/2019 0458   LDLCALC 97 10/24/2022 1432   LDLCALC 110 (H) 07/06/2022 1145    Physical Exam:   VS:  BP (!) 178/98   Pulse (!) 55   Ht 6' (1.829 m)   Wt 161 lb 6.4 oz (73.2 kg)   SpO2 98%   BMI 21.89 kg/m  , BMI Body mass index is 21.89 kg/m.  Vitals:   02/08/23 1341 02/08/23 1413  BP: (!) 184/93 (!) 178/98  Pulse: (!) 55    Height: 6' (1.829 m)   Weight: 161 lb 6.4 oz (73.2 kg)   SpO2: 98%   BMI (Calculated): 21.89     GENERAL:  Well appearing HEENT: Pupils equal round and reactive, fundi not visualized, oral mucosa unremarkable NECK:  No jugular venous distention, waveform within normal limits, carotid upstroke brisk and symmetric, no bruits, no thyromegaly LYMPHATICS:  No cervical adenopathy LUNGS:  Clear to auscultation bilaterally HEART:  RRR.  PMI not displaced or sustained,S1 and S2 within normal limits, no S3, no S4,  no clicks, no rubs, no murmurs ABD:  Flat, positive bowel sounds normal in frequency in pitch, no bruits, no rebound, no guarding, no midline pulsatile mass, no hepatomegaly, no splenomegaly EXT:  2 plus pulses throughout, no edema, no cyanosis no clubbing SKIN:  No rashes no nodules NEURO:  Cranial nerves II through XII grossly intact, motor grossly intact throughout PSYCH:  Cognitively intact, oriented to person place and time   ASSESSMENT/PLAN:    HTN / Medication management - Antihypertensive regimen limited by renal function. Would avoid diuretic or MRA without nephrology input. Continue to utilize [facility] for adherence packaging and delivery.  Continue Hydralazine 100mg  TID  Amlodipine-Valsartan 10-320mg  daily, Nebivolol 5mg  daily.  Rx Imdur 60mg  daily.  Consider renal duplex at follow up if no improvement to rule out new RAS. Prior renal duplex 04/2016 with no stenosis.   OSA - Encouraged to continue using noninvasive ventilator. Discussed role in hypertension management.   COPD - Follows with PCP and pulmonology. No present exacerbation.Refill Albuterol provided.  CKD - PCP has referred to nephrology. Careful titration of diuretic and antihypertensive.    CAD / HLD - Stable with no anginal symptoms. Per general cardiology team.  Tobacco use - Smoking cessation encouraged. Recommend utilization of 1800QUITNOW.   PAD - No new claudication. Follows with [facility].    Screening for Secondary Hypertension:     10/10/2022    9:11 PM  Causes  Renovascular HTN Screened     - Comments 04/2016 renal duplex with no stenosis  Sleep Apnea Screened     - Comments 09/2022 recommended to resume use of nonivasive ventilator  Thyroid Disease Screened     - Comments 04/2022 normal TSH  Cushing's Syndrome N/A     - Comments non cushingoid appearance    Relevant Labs/Studies:    Latest Ref Rng & Units 12/22/2022    5:51 AM 12/21/2022    9:42 AM 12/06/2022    1:40 AM  Basic Labs  Sodium 135 - 145 mmol/L 137  138  140   Potassium 3.5 - 5.1 mmol/L 4.6  3.8  4.2   Creatinine 0.61 - 1.24 mg/dL 1.61  0.96  0.45        Latest Ref Rng & Units 04/28/2022    3:32 PM 07/14/2021    8:41 AM  Thyroid   TSH 0.40 - 4.50 mIU/L 1.28  0.511                   Disposition:    FU with MD/PharmD/APP in 2-3 months.   Medication Adjustments/Labs and Tests Ordered: Current medicines are reviewed at length with the patient today.  Concerns regarding medicines are outlined above.  No orders of the defined types were placed in this encounter.  Meds ordered this encounter  Medications   isosorbide mononitrate (IMDUR) 60 MG 24 hr tablet    Sig: Take 1 tablet (60 mg total) by mouth daily.    Dispense:  30 tablet    Refill:  5    Add to pill packs - for delivery    Supervising Provider:   Jodelle Red [[id number]]   albuterol (VENTOLIN HFA) 108 (90 Base) MCG/ACT inhaler    Sig: Inhale 1-2 puffs into the lungs every 6 (six) hours as needed for wheezing or shortness of breath.    Dispense:  18 g    Refill:  0    Needs delivery - needs refill    Supervising Provider:   Jodelle Red [  [id number]]   atorvastatin (LIPITOR) 80 MG tablet    Sig: Take 1 tablet (80 mg total) by mouth daily.    Dispense:  90 tablet    Refill:  1    For pill pack delivery    Supervising Provider:   Jodelle Red [[id number]]   hydrALAZINE (APRESOLINE) 100 MG tablet    Sig: Take  1 tablet (100 mg total) by mouth 3 (three) times daily.    Dispense:  90 tablet    Refill:  5    Supervising Provider:   Jodelle Red [[id number]]   nebivolol (BYSTOLIC) 5 MG tablet    Sig: Take 1 tablet (5 mg total) by mouth daily.    Dispense:  30 tablet    Refill:  5    Supervising Provider:   Jodelle Red [[id number]]     Signed, Alver Sorrow, NP  02/08/2023 2:30 PM    [facility]

## 2023-02-08 NOTE — Telephone Encounter (Signed)
Pt is requesting refill. The pt's chart states that it was last refilled on 01-25-2023. The lov does not verify to continuation or discontinuation of medication. Please advise.

## 2023-02-12 ENCOUNTER — Other Ambulatory Visit (HOSPITAL_COMMUNITY): Payer: Self-pay

## 2023-02-12 ENCOUNTER — Other Ambulatory Visit: Payer: Self-pay | Admitting: Family Medicine

## 2023-02-12 MED ORDER — MONTELUKAST SODIUM 10 MG PO TABS
10.0000 mg | ORAL_TABLET | Freq: Every day | ORAL | 5 refills | Status: DC
  Filled ????-??-??: fill #0

## 2023-02-13 ENCOUNTER — Other Ambulatory Visit: Payer: Self-pay

## 2023-02-13 ENCOUNTER — Other Ambulatory Visit (HOSPITAL_COMMUNITY): Payer: Self-pay

## 2023-02-13 DIAGNOSIS — R911 Solitary pulmonary nodule: Secondary | ICD-10-CM

## 2023-02-13 DIAGNOSIS — Z122 Encounter for screening for malignant neoplasm of respiratory organs: Secondary | ICD-10-CM

## 2023-02-13 DIAGNOSIS — Z87891 Personal history of nicotine dependence: Secondary | ICD-10-CM

## 2023-02-14 DIAGNOSIS — R918 Other nonspecific abnormal finding of lung field: Secondary | ICD-10-CM

## 2023-02-14 HISTORY — DX: Other nonspecific abnormal finding of lung field: R91.8

## 2023-02-15 MED FILL — Montelukast Sodium Tab 10 MG (Base Equiv): ORAL | 30 days supply | Qty: 30 | Fill #0 | Status: CN

## 2023-02-16 ENCOUNTER — Other Ambulatory Visit (HOSPITAL_COMMUNITY): Payer: Self-pay

## 2023-02-16 ENCOUNTER — Other Ambulatory Visit: Payer: Self-pay

## 2023-02-20 ENCOUNTER — Other Ambulatory Visit: Payer: Self-pay

## 2023-02-20 MED FILL — Montelukast Sodium Tab 10 MG (Base Equiv): ORAL | 30 days supply | Qty: 30 | Fill #0 | Status: AC

## 2023-02-21 ENCOUNTER — Other Ambulatory Visit: Payer: Self-pay

## 2023-02-21 ENCOUNTER — Other Ambulatory Visit: Payer: Self-pay | Admitting: *Deleted

## 2023-02-21 NOTE — Patient Instructions (Signed)
 Visit Information  Mr. Kevin Kevin [name] was given information about Medicaid Managed Care team care coordination services as a part of their Healthy [facility] Medicaid benefit. Kevin Kevin [name] verbally consented to engagement with the [facility] Managed Care team.   If you are experiencing a medical emergency, please call 911 or report to your local emergency department or urgent care.   If you have a non-emergency medical problem during routine business hours, please contact your provider's office and ask to speak with a nurse.   For questions related to your Healthy [facility] health plan, please call: [phone] or visit the homepage here: [email or web address]  If you would like to schedule transportation through your Healthy [facility] plan, please call the following number at least 2 days in advance of your appointment: [phone]  For information about your ride after you set it up, call Ride Assist at [phone]. Use this number to activate a Will Call pickup, or if your transportation is late for a scheduled pickup. Use this number, too, if you need to make a change or cancel a previously scheduled reservation.  If you need transportation services right away, call [phone]. The after-hours call center is staffed 24 hours to handle ride assistance and urgent reservation requests (including discharges) 365 days a year. Urgent trips include sick visits, hospital discharge requests and life-sustaining treatment.  Call the [facility] Line at [phone], at any time, 24 hours a day, 7 days a week. If you are in danger or need immediate medical attention call 911.  If you would like help to quit smoking, call 1-800-QUIT-NOW ((864)246-7387) OR Espaol: 1-855-Djelo-Ya (8-144-664-6430) o para ms informacin haga clic aqu or Text READY to 799-599 to register via text  Kevin Kevin [name],   Please see education materials related to HTN provided by  MyChart link.  Patient verbalizes understanding of instructions and care plan provided today and agrees to view in MyChart. Active MyChart status and patient understanding of how to access instructions and care plan via MyChart confirmed with patient.     Telephone follow up appointment with Managed Medicaid care management team member scheduled for:03/23/23 at 11:15am  Kevin Dimes RN, BSN Platinum  [facility] RN Care Coordinator [phone]   Following is a copy of your plan of care:  Care Plan : RN Care Manager Plan of Care  Updates made by Kevin [name], RN since 02/21/2023 12:00 AM     Problem: Health Management needs related to COPD      Long-Range Goal: Development of Plan of Care to address Health Management needs related to COPD   Start Date: 05/03/2022  Expected End Date: 04/13/2023  Note:   Current Barriers:  Chronic Disease Management support and education needs related to HTN and COPD-Patient having difficulty getting albuterol  refills. Recently had elevated BP, seen by HTN clinic and started on Imdur . He is waiting on delivery of new prescription.  RNCM Clinical Goal(s):  Patient will verbalize understanding of plan for management of HTN and COPD as evidenced by patient reports take all medications exactly as prescribed and will call provider for medication related questions as evidenced by patient reports    attend all scheduled medical appointments:  Pulmonology on 02/27/23, Nephrology 02/28/23, CT on 03/07/23, Pulmonology on 03/14/23 and Cardiology 04/12/23  as evidenced by provider documentation        through collaboration with RN Care manager, provider, and care team.   Interventions: Inter-disciplinary care team collaboration (see longitudinal plan of care)  Evaluation of current treatment plan related to  self management and patient's adherence to plan as established by provider Provided therapeutic listening Discussed  Colonoscopy will be rescheduled upon shingles healing completely and clearance by Pulmonology   Hypertension Interventions:  (Status:  Goal on track:  Yes.) Long Term Goal-Patient reports BP at home 160/80 Last practice recorded BP readings:  BP Readings from Last 3 Encounters:  02/08/23 (!) 178/98  02/05/23 (!) 156/82  01/13/23 (!) 198/100   Most recent eGFR/CrCl:  Lab Results  Component Value Date   EGFR 36 (L) 07/06/2022    No components found for: CRCL  Evaluation of current treatment plan related to hypertension self management and patient's adherence to plan as established by provider Reviewed medications with patient and discussed importance of compliance Discussed complications of poorly controlled blood pressure such as heart disease, stroke, circulatory complications, vision complications, kidney impairment, sexual dysfunction Assessed social determinant of health barriers Advised patient to take all medications to all provider appointments Discussed the importance of smoking cessation, improving diet and exercise in controlling HTN Reviewed appointment for Nephrology per referral with [facility] [phone], 02/28/23 at 10am-provided appointment details Explained the importance of taking medications at the same time each day and checking BP 1-2 hours after taking medications Reviewed provider notes and discussed Explained that albuterol  inhaler is an as needed medication, he will have to call the pharmacy to request refills  Smoking Cessation Interventions:  (Status:  Goal on track:  Yes.) Long Term Goal patient has not smoked in a couple of days Reviewed smoking history:  tobacco abuse of >20 years; currently smoking 1-2 ppd Previous quit attempts, unsuccessful several /successful using none  Reports smoking within 30 minutes of waking up Reports triggers to smoke include: stress Reports motivation to quit smoking includes: family and health On a scale of 1-10,  reports MOTIVATION to quit is 10 On a scale of 1-10, reports CONFIDENCE in quitting is 10  Evaluation of current treatment plan reviewed Provided patient with printed smoking cessation educational materials Provided contact information for [place] [facility] (1-800-QUIT-NOW) Assessed social determinant of health barriers   COPD: (Status: Goal on Track (progressing): YES.) Long Term Goal  Reviewed medications with patient Provided instruction about proper use of medications used for management of COPD including inhalers Advised patient to self assesses COPD action plan zone and make appointment with provider if in the yellow zone for 48 hours without improvement Advised patient to engage in light exercise as tolerated 3-5 days a week to aid in the the management of COPD Provided education about and advised patient to utilize infection prevention strategies to reduce risk of respiratory infection Discussed the importance of adequate rest and management of fatigue with COPD Assessed social determinant of health barriers Reviewed sleep study evaluation and discussed with patient-waiting to be scheduled for new mask fitting-advised patient to reach out to [facility] regarding new mask Advised patient to continue to work on smoking cessation Discussed scheduled with Pulmonology on 02/27/23, CT on 03/07/23 and Pulmonology to review CT results on 03/14/23 Advised patient to discuss needing albuterol  prescription updated with Pulmonology Ensured patient has albuterol  inhaler  Patient Goals/Self-Care Activities: Take medications as prescribed   Attend all scheduled provider appointments Call pharmacy for medication refills 3-7 days in advance of running out of medications Call provider office for new concerns or questions  Work with the social worker to address care coordination needs and will continue to work with the clinical team to address health  care and disease management related needs identify and  avoid work-related triggers develop a rescue plan develop a new routine to improve sleep get at least 7 to 8 hours of sleep at night

## 2023-02-21 NOTE — Patient Outreach (Signed)
 Medicaid Managed Care   Nurse Care Manager Note  02/21/2023 Name:  Kevin [name] MRN:  [id number] DOB:  [birth year]  OMARE [name] is an 68 y.o. year old male who is a primary patient of Kevin Beverley NOVAK, MD.  [facility] Managed Care Coordination team was consulted for assistance with:    HTN COPD  Mr. Kaminsky was given information about Medicaid Managed Care Coordination team services today. Kevin [name] Search Patient agreed to services and verbal consent obtained.  Engaged with patient by telephone for follow up visit in response to provider referral for case management and/or care coordination services.   Patient is participating in a Managed Medicaid Plan:  Yes  Assessments/Interventions:  Review of past medical history, allergies, medications, health status, including review of consultants reports, laboratory and other test data, was performed as part of comprehensive evaluation and provision of chronic care management services.  SDOH (Social Drivers of Health) assessments and interventions performed: SDOH Interventions    Flowsheet Row Office Visit from 08/08/2022 in [facility] Patient Outreach Telephone from 08/07/2022 in [place] POPULATION HEALTH DEPARTMENT Patient Outreach Telephone from 07/04/2022 in [place] POPULATION HEALTH DEPARTMENT Patient Outreach Telephone from 06/02/2022 in [place] POPULATION HEALTH DEPARTMENT Patient Outreach Telephone from 05/04/2022 in [place] POPULATION HEALTH DEPARTMENT Patient Outreach Telephone from 05/03/2022 in [place] POPULATION HEALTH DEPARTMENT  SDOH Interventions        Food Insecurity Interventions -- Intervention Not Indicated -- -- -- Intervention Not Indicated  Housing Interventions -- Intervention Not Indicated -- Intervention Not Indicated -- --  Transportation Interventions -- -- Payor Benefit  [Provided with [phone]] Intervention Not Indicated -- Payor Benefit   [employer] [phone]  Utilities Interventions -- Other (Comment)  [Advised patient to contact [employer] for increased bill and inquire about financial assistance programs] -- -- -- --  Depression Interventions/Treatment  Patient refuses Treatment -- -- -- -- --  Stress Interventions -- -- -- -- [employer], Provide Counseling --       Care Plan  No Known Allergies  Medications Reviewed Today     Reviewed by [name], RN (Registered Nurse) on 02/21/23 at 1123  Med List Status: <None>   Medication Order Taking? Sig Documenting Provider Last Dose Status Informant  albuterol  (VENTOLIN  HFA) 108 (90 Base) MCG/ACT inhaler [id number] Yes Inhale 1-2 puffs into the lungs every 6 (six) hours as needed for wheezing or shortness of breath. Kevin Reche RAMAN, NP Taking Active   amLODipine -valsartan  (EXFORGE ) 10-320 MG tablet [id number] Yes Take 1 tablet by mouth daily. Goodrich, Callie E, PA-C Taking Active   aspirin  EC 81 MG tablet [id number] Yes Take 1 tablet (81 mg total) by mouth daily, swallow whole Kevin Beverley NOVAK, MD Taking Active            Med Note [name], [name]   Tue Dec 05, 2022  3:43 PM)    atorvastatin  (LIPITOR) 80 MG tablet [id number] Yes Take 1 tablet (80 mg total) by mouth daily. Kevin Reche RAMAN, NP Taking Active   busPIRone  (BUSPAR ) 7.5 MG tablet [id number] Yes Take 1 tablet (7.5 mg total) by mouth 2 (two) times daily. Kevin Beverley NOVAK, MD Taking Active            Med Note CHARLIES, [name] A   Mon Jan 22, 2023 11:48 AM)    dolutegravir -lamiVUDine  (DOVATO ) 50-300 MG tablet [id number] Yes Take 1 tablet by mouth daily. Van  Dam, Kevin SAILOR, MD Taking Active Self  Fluticasone -Umeclidin-Vilant (TRELEGY ELLIPTA ) 200-62.5-25 MCG/ACT AEPB [id number] Yes Inhale 1 puff into the lungs daily.  Taking Active            Med Note [name], Kevin A   Tue Dec 05, 2022  3:42 PM)    hydrALAZINE  (APRESOLINE ) 100 MG tablet [id number] Yes  Take 1 tablet (100 mg total) by mouth 3 (three) times daily. Kevin Reche RAMAN, NP Taking Active   HYDROcodone -acetaminophen  (NORCO) 7.5-325 MG tablet [id number] No Take 1 tablet by mouth every 6 (six) hours as needed for moderate pain (pain score 4-6).  Patient not taking: Reported on 02/21/2023   [name], FNP Not Taking Active   isosorbide  mononitrate (IMDUR ) 60 MG 24 hr tablet [id number] No Take 1 tablet (60 mg total) by mouth daily.  Patient not taking: Reported on 02/21/2023   Kevin Reche RAMAN, NP Not Taking Active            Med Note (Kevin [name] A   Wed Feb 21, 2023 11:22 AM) Waiting on medication to be filled  montelukast  (SINGULAIR ) 10 MG tablet [id number] Yes Take 1 tablet (10 mg total) by mouth at bedtime. Kevin Comer GAILS, NP Taking Active   naloxone  (NARCAN ) nasal spray 4 mg/0.1 mL [id number] No Call 911. Spray contents of 1 spray into one nostril. Repeat in 2-3 min if symptoms of opioid emergency persists; alternate nostrils  Patient not taking: Reported on 02/21/2023    Not Taking Active            Med Note [name], Kevin   Thu Dec 21, 2022  6:54 PM) Active prescription, no recent need/use  nebivolol  (BYSTOLIC ) 5 MG tablet [id number] Yes Take 1 tablet (5 mg total) by mouth daily. Kevin Reche RAMAN, NP Taking Active   triamcinolone  cream (KENALOG ) 0.1 % [id number] No Apply 1 Application topically 2 (two) times daily. Apply a small amount twice daily to the affected areas for 10 days and then discontinue  Patient not taking: Reported on 02/21/2023   Kevin [name] Not Taking Active             Patient Active Problem List   Diagnosis Date Noted   COPD exacerbation (HCC) 12/21/2022   Anxiety 12/19/2022   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   Skin nodule 08/08/2022   Lung nodule, multiple 07/26/2022   Lipoma 07/06/2022   Chronic low back pain 04/29/2022   Chronic heart failure with preserved ejection fraction (HFpEF) (HCC) 12/27/2021   HLD (hyperlipidemia)  12/27/2021   Right renal mass 06/29/2021   Renal lesion 06/29/2021   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   Acute respiratory failure with hypoxia (HCC)    Callus of foot 07/10/2018   CAD (coronary artery disease) 10/19/2016   Easy bruising 08/11/2016   Claudication in peripheral vascular disease (HCC) 06/05/2016   CKD stage 3b, GFR 30-44 ml/min (HCC) - baseline SCr 1.8-2.2    Peripheral arterial disease (HCC) 05/09/2016   OSA (obstructive sleep apnea) 03/24/2016   Essential hypertension 02/18/2016   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   Blurry vision, bilateral    HIV (human immunodeficiency virus infection) (HCC) 08/27/2004    Conditions to be addressed/monitored per PCP order:  HTN and COPD  Care Plan : RN Care Manager Plan of Care  Updates made by Lucky Andrea LABOR, RN since 02/21/2023 12:00 AM     Problem: Health Management needs related to COPD      Long-Range Goal: Development of Plan of Care to address Health Management needs related to COPD   Start Date: 05/03/2022  Expected End Date: 04/13/2023  Note:   Current Barriers:  Chronic Disease Management support and education needs related to HTN and COPD-Patient having difficulty getting albuterol  refills. Recently had elevated BP, seen by HTN clinic and started on Imdur . He is waiting on delivery of new prescription.  RNCM Clinical Goal(s):  Patient will verbalize understanding of plan for management of HTN and COPD as evidenced by patient reports take all medications exactly as prescribed and will call provider for medication related questions as evidenced by patient reports    attend all scheduled medical appointments:  Pulmonology on  02/27/23, Nephrology 02/28/23, CT on 03/07/23, Pulmonology on 03/14/23 and Cardiology 04/12/23  as evidenced by provider documentation        through collaboration with RN Care manager, provider, and care team.   Interventions: Inter-disciplinary care team collaboration (see longitudinal plan of care) Evaluation of current treatment plan related to  self management and patient's adherence to plan as established by provider Provided therapeutic listening Discussed Colonoscopy will be rescheduled upon shingles healing completely and clearance by Pulmonology   Hypertension Interventions:  (Status:  Goal on track:  Yes.) Long Term Goal-Patient reports BP at home 160/80 Last practice recorded BP readings:  BP Readings from Last 3 Encounters:  02/08/23 (!) 178/98  02/05/23 (!) 156/82  01/13/23 (!) 198/100   Most recent eGFR/CrCl:  Lab Results  Component Value Date   EGFR 36 (L) 07/06/2022    No components found for: CRCL  Evaluation of current treatment plan related to hypertension self management and patient's adherence to plan as established by provider Reviewed medications with patient and discussed importance of compliance Discussed complications of poorly controlled blood pressure such as heart disease, stroke, circulatory complications, vision complications, kidney impairment, sexual dysfunction Assessed social determinant of health barriers Advised patient to take all medications to all provider appointments Discussed the importance of smoking cessation, improving diet and exercise in controlling HTN Reviewed appointment for Nephrology per referral with [facility] [phone], 02/28/23 at 10am-provided appointment details Explained the importance of taking medications at the same time each day and checking BP 1-2 hours after taking medications Reviewed provider notes and discussed Explained that albuterol  inhaler is an as needed medication, he will have to call the pharmacy to request  refills  Smoking Cessation Interventions:  (Status:  Goal on track:  Yes.) Long Term Goal patient has not smoked in a couple of days Reviewed smoking history:  tobacco abuse of >20 years; currently smoking 1-2 ppd Previous quit attempts, unsuccessful several /successful using none  Reports smoking within 30 minutes of waking up Reports triggers to smoke include: stress Reports motivation to quit smoking includes: family and health On a scale of 1-10, reports MOTIVATION to quit is 10 On a scale of 1-10, reports CONFIDENCE in quitting is 10  Evaluation of current treatment plan reviewed Provided patient with printed smoking cessation educational materials Provided contact information for [place] [facility] (1-800-QUIT-NOW) Assessed social determinant of health barriers   COPD: (Status: Goal on Track (progressing): YES.) Long Term Goal  Reviewed medications with patient Provided instruction about proper use of medications used for management of COPD including inhalers  Advised patient to self assesses COPD action plan zone and make appointment with provider if in the yellow zone for 48 hours without improvement Advised patient to engage in light exercise as tolerated 3-5 days a week to aid in the the management of COPD Provided education about and advised patient to utilize infection prevention strategies to reduce risk of respiratory infection Discussed the importance of adequate rest and management of fatigue with COPD Assessed social determinant of health barriers Reviewed sleep study evaluation and discussed with patient-waiting to be scheduled for new mask fitting-advised patient to reach out to [facility] regarding new mask Advised patient to continue to work on smoking cessation Discussed scheduled with Pulmonology on 02/27/23, CT on 03/07/23 and Pulmonology to review CT results on 03/14/23 Advised patient to discuss needing albuterol  prescription updated with Pulmonology Ensured patient has  albuterol  inhaler  Patient Goals/Self-Care Activities: Take medications as prescribed   Attend all scheduled provider appointments Call pharmacy for medication refills 3-7 days in advance of running out of medications Call provider office for new concerns or questions  Work with the social worker to address care coordination needs and will continue to work with the clinical team to address health care and disease management related needs identify and avoid work-related triggers develop a rescue plan develop a new routine to improve sleep get at least 7 to 8 hours of sleep at night       Follow Up:  Patient agrees to Care Plan and Follow-up.  Plan: The Managed Medicaid care management team will reach out to the patient again over the next 30 days.  Date/time of next scheduled RN care management/care coordination outreach:  03/23/23 at 11:15am  Andrea Dimes RN, BSN [place]  [facility] RN Care Coordinator [phone]

## 2023-02-23 ENCOUNTER — Other Ambulatory Visit: Payer: Self-pay

## 2023-02-25 NOTE — Progress Notes (Signed)
 Subjective:    Patient ID: Kevin [name], male    DOB: 1955-02-16, 68 y.o.   MRN: [id number]  Dr Asuncion HPI 68 year old patient with a history of tobacco use (40+ pk-yrs) and COPD, renal cell cancer, HIV on therapy, hypertension and atrial fibrillation with diastolic CHF, OSA on CPAP, gout.  He has been seen by Dr. Gladis in our office for his COPD and also pulmonary nodular disease on a CT chest 11/2020.  Repeat CT scan of the chest was done as below as well as subsequent PET scan.  CT scan of the chest 09/06/2022 reviewed by me shows multiple stable right sided solid and subsolid pulmonary nodules that were stable or regressed.  2 nodules had increased in size including a 7 mm solid right upper lobe nodule, 15 mm mixed density right lower lobe nodule.  This prompted PET scan. He has been experiencing episodes of acute dyspnea, has been back and forth to the ED multiple times this year. Was started on Trelegy in June. Can happen w exertion, chores or at rest. Does not wake him from sleep. He coughs through the day, produces mucous in the am. ? Whether there may be a component of panic / anxiety. Albuterol  nebs 3x a day. Unclear triggers. Does have trouble in hot air.   PET scan 09/21/2022 reviewed by me, shows mild hypermetabolism within the mediastinal nodes with SUV max 4.1 of unclear significance.  The right upper lobe peribronchovascular nodules measured at 5 mm.  None of the groundglass nodules had any hypermetabolism.  There was some nonspecific hypermetabolism in the right apex as well as inferior lingula and an area of some minimal airspace disease that was new compared with the CT done 7/24  [name] 11/29/2022 --68 year old smoker with COPD, HIV, renal cell cancer, hypertension, A-fib with diastolic dysfunction, OSA on CPAP.  He is preparing to undergo colonoscopy with [facility] gastroenterology and needs preoperative surgical evaluation.  I saw him in August for an abnormal CT scan of the chest with  small right upper lobe and right lower lobe pulmonary nodules.  These look as they may have evolved on CT but they were negative on PET scan.  We plan for repeat imaging in January 2025 he was in the emergency department 11/18/2022 with acute exacerbation of COPD, received an extended nebulizer treatment, steroids and was sent out on prednisone  and doxycycline .  His maintenance medication has been Trelegy, albuterol  or DuoNeb as needed. He has been off the trelegy for over a month. He is still smoking a few cig a day. He is feeling better.   PFT from 11/24/22 reviewed and shows severe obstruction without a bronchodilator response, FEV1 42% predicted.  Lung volumes are hyperinflated.  Diffusion capacity decreased and corrects to normal range when adjusted for alveolar volume ================================================== 12/14/22- 66 yoM new to me for sleep evaluation-  see above from Dr Shelah (due back with Dr Shelah in January)..   Smoker(2 cigs/day) with COPD, HIV, renal cell cancer, hypertension, A-fib with diastolic dysfunction, OSA on CPAP -Neb Muccomyst, Neb Duoneb, Albuterol  hfa, Trelegy 200, Singulair ,  NPSG 05/07/19- AHI 62.9/hr, desat to 92%, BIPAP to 18/14. Frequent Centrals> recommended AutoServoVentilation study. Body weight 180 lbs ASV titration 06/10/19- EPAP min 7, Max 7, PS min5, Max 20, Breath rate AutoBrMode Epworth score-15 BP on arrival 212/130    no BP meds taken today Body weight today  Daughter here Bedtime 3AM, sleep latency 30 minutes, No WASO, up 8AM. Admits doesn't use BiPAP every night.  02/27/23- 67 yoM followed for OSA, complicated by Smoker(2 cigs/day) with COPD (Dr Shelah), HIV, renal cell cancer, hypertension, A-fib with diastolic dysfunction, OSA on CPAP  Neb Duoneb, Albuterol  hfa, Trelegy 200, Singulair ,  ASV titration 06/10/19- EPAP min 7, Max 7, PS min5, Max 20, Breath rate AutoBrMode/ [facility] > not using ASV  due to frequent Centrals. At last visit we asked [facility] to refit mask. Lung nodule and COPD are followed by Dr Shelah. Discussed the use of AI scribe software for clinical note transcription with the patient, who gave verbal consent to proceed.  History of Present Illness   The patient, with a history of COPD and sleep apnea, presents for a follow-up visit. He reports a unique sleep pattern, going to bed around 4-5 PM, waking up around 9-10 PM, and then going back to sleep around 8 AM until around noon.SABRA He denies any issues with this sleep pattern and reports feeling rested. He does not use a home ventilator or CPAP machine. He found these unhelpful.  He denies taking any sleep aids and reports occasional coffee use. He is not working and doesn't have to comply with a schedule. Since he is comfortable with this pattern, I felt it best not to try to force a more conventional sleep schedule with medication.  The patient also reports needing refills for his albuterol  rescue inhaler and DuoNeb nebulizer solution. He expresses a preference for the nebulizer solution in single-dose ampules rather than small bottles. He also mentions an upcoming CT scan to monitor lung nodules, expressing concern due to a family history of lung and brain cancer. He will be seeing Lauraine Lites, NP later this month for CT and Pulmonary f/u.     ROS-see HPI   + = positive Constitutional:    weight loss, night sweats, fevers, chills, fatigue, lassitude.   HEENT:    headaches, difficulty swallowing, tooth/dental problems, sore throat,       sneezing, itching, ear ache, nasal congestion, post nasal drip, snoring CV:    chest pain, orthopnea, PND, swelling in lower extremities, anasarca,     dizziness, +palpitations Resp:   +shortness of breath with exertion or at rest.                productive cough,   non-productive cough, coughing up of blood.              change in color of mucus.  wheezing.   Skin:    rash or lesions. GI:   No-   heartburn, indigestion, abdominal pain, nausea, vomiting, diarrhea,                 change in bowel habits, loss of appetite GU: dysuria, change in color of urine, no urgency or frequency.   flank pain. MS:   joint pain, stiffness, decreased range of motion, back pain. Neuro-     nothing unusual Psych:  change in mood or affect.  depression or +anxiety.   memory loss.  OBJ- Physical Exam     +note BP General- Alert, Oriented, Affect-appropriate, Distress- none acute, ++slender Skin- rash-none, lesions- none, excoriation- none Lymphadenopathy- none Head- atraumatic            Eyes- Gross vision intact, PERRLA, conjunctivae and secretions clear            Ears- Hearing, canals-normal  Nose- Clear, no-Septal dev, mucus, polyps, erosion, perforation             Throat- Mallampati II , mucosa clear , drainage- none, tonsils- atrophic Neck- flexible , trachea midline, no stridor , thyroid  nl, carotid no bruit Chest - symmetrical excursion , unlabored           Heart/CV- RRR , no murmur , no gallop  , no rub, nl s1 s2                           - JVD- none , edema- none, stasis changes- none, varices- none           Lung- clear to P&A/ +diminished, wheeze- none, cough- none , dullness-none, rub- none           Chest wall-  Abd-  Br/ Gen/ Rectal- Not done, not indicated Extrem- cyanosis- none, clubbing, none, atrophy- none, strength- nl Neuro- grossly intact to observation  Assessment and Plan    Sleep Disorder   Irregular sleep schedule with sleep onset at 4-5 PM and awakening at 9-10 PM and then 8AM- 12 Noon. No reported distress or functional impairment. No interest in sleep medication.   -No changes to current sleep habits as he is not experiencing distress or functional impairment.    COPD   Request for refills of albuterol  rescue inhaler and DuoNeb nebulizer solution. No reported exacerbations or distress.   -Refill albuterol  rescue inhaler and DuoNeb nebulizer  solution to [employer].    Lung Nodules   Follow-up CT scan scheduled for 03/07/2023 with follow-up appointment with Lauraine Schultze, NP on 03/14/2023.   -Continue with scheduled CT scan and follow-up appointment.    General Health Maintenance / Followup Plans   -Schedule follow-up appointment in 1 year for sleep and breathing at night. If changes occur, he to make an appointment as needed.

## 2023-02-26 ENCOUNTER — Other Ambulatory Visit: Payer: Self-pay

## 2023-02-27 ENCOUNTER — Other Ambulatory Visit: Payer: Self-pay

## 2023-02-27 ENCOUNTER — Ambulatory Visit (INDEPENDENT_AMBULATORY_CARE_PROVIDER_SITE_OTHER): Payer: 59 | Admitting: Internal Medicine

## 2023-02-27 ENCOUNTER — Encounter: Payer: Self-pay | Admitting: Internal Medicine

## 2023-02-27 DIAGNOSIS — J449 Chronic obstructive pulmonary disease, unspecified: Secondary | ICD-10-CM

## 2023-02-27 MED ORDER — ALBUTEROL SULFATE HFA 108 (90 BASE) MCG/ACT IN AERS
1.0000 | INHALATION_SPRAY | Freq: Four times a day (QID) | RESPIRATORY_TRACT | 0 refills | Status: DC | PRN
  Filled 2023-02-27: qty 6.7, 25d supply, fill #0

## 2023-02-27 MED ORDER — IPRATROPIUM-ALBUTEROL 0.5-2.5 (3) MG/3ML IN SOLN
3.0000 mL | Freq: Four times a day (QID) | RESPIRATORY_TRACT | 12 refills | Status: AC | PRN
  Filled 2023-02-27: qty 360, 30d supply, fill #0

## 2023-02-27 NOTE — Patient Instructions (Signed)
 Refills sent for albuterol rescue inhaler and Duoneb nebulizer solution  Keep appointment with Kandice Robinsons, NP on January 29 to follow up on the   CT scan

## 2023-02-28 DIAGNOSIS — I5032 Chronic diastolic (congestive) heart failure: Secondary | ICD-10-CM | POA: Diagnosis not present

## 2023-02-28 DIAGNOSIS — C649 Malignant neoplasm of unspecified kidney, except renal pelvis: Secondary | ICD-10-CM | POA: Diagnosis not present

## 2023-02-28 DIAGNOSIS — Z9889 Other specified postprocedural states: Secondary | ICD-10-CM | POA: Diagnosis not present

## 2023-02-28 DIAGNOSIS — N1832 Chronic kidney disease, stage 3b: Secondary | ICD-10-CM | POA: Diagnosis not present

## 2023-02-28 DIAGNOSIS — I130 Hypertensive heart and chronic kidney disease with heart failure and stage 1 through stage 4 chronic kidney disease, or unspecified chronic kidney disease: Secondary | ICD-10-CM | POA: Diagnosis not present

## 2023-03-01 ENCOUNTER — Other Ambulatory Visit (HOSPITAL_COMMUNITY): Payer: Self-pay

## 2023-03-01 ENCOUNTER — Other Ambulatory Visit: Payer: Self-pay

## 2023-03-01 MED ORDER — VITAMIN D (ERGOCALCIFEROL) 1.25 MG (50000 UNIT) PO CAPS
50000.0000 [IU] | ORAL_CAPSULE | ORAL | 0 refills | Status: DC
  Filled 2023-03-01 – 2023-03-12 (×2): qty 12, 84d supply, fill #0
  Filled 2023-03-15: qty 4, 28d supply, fill #0

## 2023-03-02 ENCOUNTER — Encounter: Payer: Self-pay | Admitting: Pharmacist

## 2023-03-02 ENCOUNTER — Other Ambulatory Visit: Payer: Self-pay

## 2023-03-07 ENCOUNTER — Ambulatory Visit
Admission: RE | Admit: 2023-03-07 | Discharge: 2023-03-07 | Disposition: A | Discharge status: Home / Self Care | Payer: 59 | Source: Ambulatory Visit | Attending: Acute Care | Admitting: Acute Care

## 2023-03-07 ENCOUNTER — Other Ambulatory Visit: Payer: Self-pay

## 2023-03-07 DIAGNOSIS — I700 Atherosclerosis of aorta: Secondary | ICD-10-CM | POA: Diagnosis not present

## 2023-03-07 DIAGNOSIS — Z122 Encounter for screening for malignant neoplasm of respiratory organs: Secondary | ICD-10-CM

## 2023-03-07 DIAGNOSIS — R599 Enlarged lymph nodes, unspecified: Secondary | ICD-10-CM | POA: Diagnosis not present

## 2023-03-07 DIAGNOSIS — R911 Solitary pulmonary nodule: Secondary | ICD-10-CM

## 2023-03-07 DIAGNOSIS — J439 Emphysema, unspecified: Secondary | ICD-10-CM | POA: Diagnosis not present

## 2023-03-07 DIAGNOSIS — Z87891 Personal history of nicotine dependence: Secondary | ICD-10-CM

## 2023-03-07 DIAGNOSIS — R918 Other nonspecific abnormal finding of lung field: Secondary | ICD-10-CM | POA: Diagnosis not present

## 2023-03-08 ENCOUNTER — Other Ambulatory Visit: Payer: Self-pay | Admitting: Gastroenterology

## 2023-03-09 DIAGNOSIS — Z9889 Other specified postprocedural states: Secondary | ICD-10-CM | POA: Diagnosis not present

## 2023-03-09 DIAGNOSIS — N2889 Other specified disorders of kidney and ureter: Secondary | ICD-10-CM | POA: Diagnosis not present

## 2023-03-09 DIAGNOSIS — N1832 Chronic kidney disease, stage 3b: Secondary | ICD-10-CM | POA: Diagnosis not present

## 2023-03-09 DIAGNOSIS — C649 Malignant neoplasm of unspecified kidney, except renal pelvis: Secondary | ICD-10-CM | POA: Diagnosis not present

## 2023-03-09 DIAGNOSIS — N189 Chronic kidney disease, unspecified: Secondary | ICD-10-CM | POA: Diagnosis not present

## 2023-03-12 ENCOUNTER — Other Ambulatory Visit: Payer: Self-pay

## 2023-03-12 ENCOUNTER — Other Ambulatory Visit: Payer: Self-pay | Admitting: Family Medicine

## 2023-03-12 DIAGNOSIS — F419 Anxiety disorder, unspecified: Secondary | ICD-10-CM

## 2023-03-12 MED ORDER — BUSPIRONE HCL 7.5 MG PO TABS
7.5000 mg | ORAL_TABLET | Freq: Two times a day (BID) | ORAL | 0 refills | Status: DC
  Filled 2023-03-12 – 2023-03-15 (×2): qty 60, 30d supply, fill #0
  Filled 2023-03-29 – 2023-04-11 (×3): qty 60, 30d supply, fill #1
  Filled 2023-04-19 – 2023-05-07 (×3): qty 60, 30d supply, fill #2

## 2023-03-12 MED FILL — Montelukast Sodium Tab 10 MG (Base Equiv): ORAL | 30 days supply | Qty: 30 | Fill #1 | Status: CN

## 2023-03-13 ENCOUNTER — Other Ambulatory Visit (HOSPITAL_COMMUNITY): Payer: Self-pay

## 2023-03-14 ENCOUNTER — Ambulatory Visit (INDEPENDENT_AMBULATORY_CARE_PROVIDER_SITE_OTHER): Payer: 59 | Admitting: Acute Care

## 2023-03-14 ENCOUNTER — Encounter: Payer: Self-pay | Admitting: Acute Care

## 2023-03-14 ENCOUNTER — Other Ambulatory Visit: Payer: Self-pay

## 2023-03-14 ENCOUNTER — Other Ambulatory Visit (HOSPITAL_COMMUNITY): Payer: Self-pay

## 2023-03-14 VITALS — BP 156/120 | HR 63 | Ht 72.0 in | Wt 156.8 lb

## 2023-03-14 DIAGNOSIS — F17200 Nicotine dependence, unspecified, uncomplicated: Secondary | ICD-10-CM

## 2023-03-14 DIAGNOSIS — Z21 Asymptomatic human immunodeficiency virus [HIV] infection status: Secondary | ICD-10-CM

## 2023-03-14 DIAGNOSIS — J449 Chronic obstructive pulmonary disease, unspecified: Secondary | ICD-10-CM | POA: Diagnosis not present

## 2023-03-14 DIAGNOSIS — J439 Emphysema, unspecified: Secondary | ICD-10-CM

## 2023-03-14 DIAGNOSIS — R918 Other nonspecific abnormal finding of lung field: Secondary | ICD-10-CM

## 2023-03-14 DIAGNOSIS — I10 Essential (primary) hypertension: Secondary | ICD-10-CM

## 2023-03-14 MED ORDER — ALBUTEROL SULFATE HFA 108 (90 BASE) MCG/ACT IN AERS
1.0000 | INHALATION_SPRAY | Freq: Four times a day (QID) | RESPIRATORY_TRACT | 3 refills | Status: DC | PRN
  Filled 2023-03-14 – 2023-06-06 (×2): qty 6.7, 25d supply, fill #0
  Filled 2023-08-07 (×3): qty 6.7, 25d supply, fill #1
  Filled 2023-09-25: qty 6.7, 25d supply, fill #2

## 2023-03-14 NOTE — Patient Instructions (Addendum)
It is good to see you today. Your CT chest we reviewed today is stable from the previous scan done  08/2022 and 09/2022.  Plan will be for a 3 month follow up CT Chest. If this shows continued stable nodules, we will then increase spacing of imaging to 6 months. Continue using Trelegy 1 puff once daily Rinse mouth after use I have re-ordered your albuterol.  Use this as needed 1-2 puffs  for shortness of breath or wheezing.  If you find you have to use this more than 4 times a day, call to be seen. Note your daily symptoms > remember "red flags" for COPD:  Increase in cough, increase in sputum production, increase in shortness of breath or activity intolerance. If you notice these symptoms, please call to be seen.  Please contact office for sooner follow up if symptoms do not improve or worsen or seek emergency care   You can receive free nicotine replacement therapy (patches, gum, or mints) by calling 1-800-QUIT NOW. Please call so we can get you on the path to becoming a non-smoker. I know it is hard, but you can do this!  Hypnosis for smoking cessation  [employer] [phone]  Acupuncture for smoking cessation  [employer] [phone]

## 2023-03-14 NOTE — Progress Notes (Signed)
History of Present Illness Kevin [name] is a 68 y.o. male current everyday smoker followed by Dr. Delton Coombes for a pulmonary nodule.   Synopsis 68 year old patient current everyday smoker with a history of tobacco use (40+ pk-yrs) and COPD, renal cell cancer, HIV on therapy, hypertension and atrial fibrillation with diastolic CHF, OSA on CPAP, gout.  He has been seen by Dr. Thora Lance in our office for his COPD and also pulmonary nodular disease on a CT chest 11/2020.  He is also followed by Dr. Fannie Knee.    CT scan of the chest 09/06/2022 shows multiple stable right sided solid and subsolid pulmonary nodules that were stable or regressed.  2 nodules had increased in size including a 7 mm solid right upper lobe nodule, 15 mm mixed density right lower lobe nodule.  This prompted PET scan. He has been experiencing episodes of acute dyspnea, has been back and forth to the ED multiple times this year. Was started on Trelegy in June 2024. Dyspnea  occurs w exertion, chores or at rest. It Does not wake him from sleep. He coughs through the day, produces mucous in the am. ? Whether there may be a component of panic / anxiety. Albuterol nebs 3x a day. Unclear triggers. Does have trouble in hot  and humid air.    PET scan 09/21/2022  showed mild hypermetabolism within the mediastinal nodes with SUV max 4.1 of unclear significance.  The right upper lobe peribronchovascular nodules measured at 5 mm.  None of the groundglass nodules had any hypermetabolism.  There was some nonspecific hypermetabolism in the right apex as well as inferior lingula and an area of some minimal airspace disease that was new compared with the CT done 7/24  Plan was for a 55-month follow-up CT which was done in January 2025.  Patient is here to review the results of the repeat imaging   03/14/2023 Pt. Presents for follow up after CT Chest'. He was last seen by Dr. Delton Coombes 11/29/2022.  This is annual reimaging for surveillance. Repeat CT imaging  shows multiple solid and semisolid lung nodules that are unchanged in size compared to prior CT scans. Previously there was some hypermetabolic small mediastinal hilar nodes which were unchanged in size however will require careful attention at follow-up.  Recommendation per radiology is for a short-term surveillance scan to reevaluate these nodules.  Plan will be for a 51-month follow-up CT chest if this continues to show stable nodules plan will be to space imaging to 6 months  Test Results: CT chest 03/07/2023 Mediastinum/Nodes: Slightly patulous thoracic esophagus. Preserved thyroid gland. On this non IV contrast exam there is no specific abnormal lymph node enlargement identified in the axillary regions, hilum or mediastinum on the prior examination there were some hypermetabolic nodes identified in the mediastinum and right lung hilum. Specific example of a node in the right lung hilum measured 7 mm in short axis on the prior examination and today 6 mm on series 2, image 68. Other nodes are similar as well.   Lungs/Pleura: No consolidation, pneumothorax or effusion. Some underlying chronic lung changes identified. The semi-solid nodular area in the right lower lobe which previously had an AP dimension of 15 mm, today when measured in the same fashion on series 8, image 93 measures 13 mm. This is not have increased uptake on PET. 7 mm right upper lobe more solid nodule on the previous examination today on series 8, image 39 measures 6 mm. Smaller ground-glass in the  right lung on series 8, image 70 is stable. Subtle areas of nodularity centrally in the middle lobe on series 8 image 86 and 88 are stable. These are small. No new dominant lung nodules. The area of hypermetabolism at the right lung apex does not have a CT correlate today. Similar area in the lingula without a CT correlate.   Upper Abdomen: Along the upper abdomen the adrenal glands are preserved. There is focal atrophy  along the posterior aspect of the right kidney. Please correlate with prior intervention or other process.   Musculoskeletal: Mild degenerative changes along the spine.   IMPRESSION: Multiple solid and semi solid lung nodules are unchanged in size compared to the prior CT scan. Recommend continued short-term surveillance.   Previously there were some hypermetabolic small mediastinal hilar nodes which are unchanged in size. Additional attention on follow-up.   Aortic Atherosclerosis     Latest Ref Rng & Units 12/22/2022    5:51 AM 12/21/2022    9:42 AM 12/06/2022    1:40 AM  CBC  WBC 4.0 - 10.5 K/uL 15.5  9.7  13.9   Hemoglobin 13.0 - 17.0 g/dL 16.1  09.6  9.6   Hematocrit 39.0 - 52.0 % 31.5  32.9  28.4   Platelets 150 - 400 K/uL 281  276  231        Latest Ref Rng & Units 12/22/2022    5:51 AM 12/21/2022    9:42 AM 12/06/2022    1:40 AM  BMP  Glucose 70 - 99 mg/dL 045  409  91   BUN 8 - 23 mg/dL 26  15  19    Creatinine 0.61 - 1.24 mg/dL 8.11  9.14  7.82   Sodium 135 - 145 mmol/L 137  138  140   Potassium 3.5 - 5.1 mmol/L 4.6  3.8  4.2   Chloride 98 - 111 mmol/L 109  111  115   CO2 22 - 32 mmol/L 19  18  20    Calcium 8.9 - 10.3 mg/dL 9.1  9.0  8.0     BNP    Component Value Date/Time   BNP 97.5 11/18/2022 0831    ProBNP No results found for: "PROBNP"  PFT    Component Value Date/Time   FEV1PRE 1.59 11/24/2022 1551   FEV1POST 1.70 11/24/2022 1551   FVCPRE 3.52 11/24/2022 1551   FVCPOST 3.24 11/24/2022 1551   TLC 8.39 11/24/2022 1551   DLCOUNC 19.08 11/24/2022 1551   PREFEV1FVCRT 45 11/24/2022 1551   PSTFEV1FVCRT 53 11/24/2022 1551    CT Chest Wo Contrast Result Date: 03/13/2023 CLINICAL DATA:  Renal cell carcinoma. Lung nodules. * Tracking Code: BO * EXAM: CT CHEST WITHOUT CONTRAST TECHNIQUE: Multidetector CT imaging of the chest was performed following the standard protocol without IV contrast. RADIATION DOSE REDUCTION: This exam was performed according  to the departmental dose-optimization program which includes automated exposure control, adjustment of the mA and/or kV according to patient size and/or use of iterative reconstruction technique. COMPARISON:  CT 09/06/2022.  PET-CT scan 09/21/2022. FINDINGS: Cardiovascular: The thoracic aorta is normal course and caliber on this non IV contrast exam with mild atherosclerotic calcified plaque. Heart nonenlarged. No pericardial effusion. Mild coronary artery calcifications are seen. Please correlate for other coronary risk factors. Mediastinum/Nodes: Slightly patulous thoracic esophagus. Preserved thyroid gland. On this non IV contrast exam there is no specific abnormal lymph node enlargement identified in the axillary regions, hilum or mediastinum on the prior examination there were some hypermetabolic  nodes identified in the mediastinum and right lung hilum. Specific example of a node in the right lung hilum measured 7 mm in short axis on the prior examination and today 6 mm on series 2, image 68. Other nodes are similar as well. Lungs/Pleura: No consolidation, pneumothorax or effusion. Some underlying chronic lung changes identified. The semi-solid nodular area in the right lower lobe which previously had an AP dimension of 15 mm, today when measured in the same fashion on series 8, image 93 measures 13 mm. This is not have increased uptake on PET. 7 mm right upper lobe more solid nodule on the previous examination today on series 8, image 39 measures 6 mm. Smaller ground-glass in the right lung on series 8, image 70 is stable. Subtle areas of nodularity centrally in the middle lobe on series 8 image 86 and 88 are stable. These are small. No new dominant lung nodules. The area of hypermetabolism at the right lung apex does not have a CT correlate today. Similar area in the lingula without a CT correlate. Upper Abdomen: Along the upper abdomen the adrenal glands are preserved. There is focal atrophy along the  posterior aspect of the right kidney. Please correlate with prior intervention or other process. Musculoskeletal: Mild degenerative changes along the spine. IMPRESSION: Multiple solid and semi solid lung nodules are unchanged in size compared to the prior CT scan. Recommend continued short-term surveillance. Previously there were some hypermetabolic small mediastinal hilar nodes which are unchanged in size. Additional attention on follow-up. Aortic Atherosclerosis (ICD10-I70.0) and Emphysema (ICD10-J43.9). Electronically Signed   By: Karen Kays M.D.   On: 03/13/2023 17:52     Past medical hx Past Medical History:  Diagnosis Date   AIDS (acquired immune deficiency syndrome) (HCC) 08/17/2016   Amaurosis fugax 08/18/2022   Chronic diastolic CHF (congestive heart failure), NYHA class 3 (HCC) 01/2016   Chronic lower back pain    CKD (chronic kidney disease), stage III (HCC)    COPD (chronic obstructive pulmonary disease) (HCC)    Gout    "forearms, hands, ankles, feet" (06/05/2016)   Headache    "weekly" (06/05/2016)   Heart murmur    Hypertension    Hypertensive crisis 08/15/2017   Lipoma 07/06/2022   OSA on CPAP    PAD (peripheral artery disease) (HCC)    PAF (paroxysmal atrial fibrillation) (HCC) 01/2016   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     Social History   Tobacco Use   Smoking status: Every Day    Current packs/day: 0.10    Average packs/day: 0.1 packs/day for 42.0 years (4.2 ttl pk-yrs)    Types: Cigarettes, E-cigarettes    Passive exposure: Never   Smokeless tobacco: Never   Tobacco comments:    1-2 cigarettes a day 09/29/22 Tay  Vaping Use   Vaping status: Every Day   Substances: Nicotine, Flavoring  Substance Use Topics   Alcohol use: Not Currently    Alcohol/week: 2.0 standard drinks of alcohol    Types: 2 Cans of beer per week    Comment: rare   Drug use: Not Currently    Comment: rare    Mr.Clauss reports that he has been smoking cigarettes and  e-cigarettes. He has a 4.2 pack-year smoking history. He has never been exposed to tobacco smoke. He has never used smokeless tobacco. He reports that he does not currently use alcohol after a past usage of about 2.0 standard drinks of alcohol per week. He reports that he does  not currently use drugs.  Tobacco Cessation: Ready to quit: Not Answered Counseling given: Not Answered Tobacco comments: 1-2 cigarettes a day 09/29/22 Tay  Smoking cessation Counseling  The patient's current tobacco use: 1 to 2 cigarettes daily, trying to quit The patient was advised to quit and impact of smoking on their health.  I assessed the patient's willingness to attempt to quit.  Currently working on smoking cessation I provided methods and skills for cessation. Resources to help quit smoking were provided, see AVS. A smoking cessation quit date was set: No Follow-up was arranged in our clinic.  Yes Counseled x  3 minutes today in clinic   Past surgical hx, Family hx, Social hx all reviewed.  Current Outpatient Medications on File Prior to Visit  Medication Sig   albuterol (VENTOLIN HFA) 108 (90 Base) MCG/ACT inhaler Inhale 1-2 puffs into the lungs every 6 (six) hours as needed for wheezing or shortness of breath.   amLODipine-valsartan (EXFORGE) 10-320 MG tablet Take 1 tablet by mouth daily.   aspirin EC 81 MG tablet Take 1 tablet (81 mg total) by mouth daily, swallow whole   atorvastatin (LIPITOR) 80 MG tablet Take 1 tablet (80 mg total) by mouth daily.   busPIRone (BUSPAR) 7.5 MG tablet Take 1 tablet (7.5 mg total) by mouth 2 (two) times daily.   dolutegravir-lamiVUDine (DOVATO) 50-300 MG tablet Take 1 tablet by mouth daily.   Fluticasone-Umeclidin-Vilant (TRELEGY ELLIPTA) 200-62.5-25 MCG/ACT AEPB Inhale 1 puff into the lungs daily.   hydrALAZINE (APRESOLINE) 100 MG tablet Take 1 tablet (100 mg total) by mouth 3 (three) times daily.   HYDROcodone-acetaminophen (NORCO) 7.5-325 MG tablet Take 1 tablet by  mouth every 6 (six) hours as needed for moderate pain (pain score 4-6).   ipratropium-albuterol (DUONEB) 0.5-2.5 (3) MG/3ML SOLN Take 3 mLs by nebulization every 6 (six) hours as needed.   isosorbide mononitrate (IMDUR) 60 MG 24 hr tablet Take 1 tablet (60 mg total) by mouth daily.   montelukast (SINGULAIR) 10 MG tablet Take 1 tablet (10 mg total) by mouth at bedtime.   naloxone (NARCAN) nasal spray 4 mg/0.1 mL Call 911. Spray contents of 1 spray into one nostril. Repeat in 2-3 min if symptoms of opioid emergency persists; alternate nostrils   nebivolol (BYSTOLIC) 5 MG tablet Take 1 tablet (5 mg total) by mouth daily.   triamcinolone cream (KENALOG) 0.1 % Apply 1 Application topically 2 (two) times daily. Apply a small amount twice daily to the affected areas for 10 days and then discontinue   Vitamin D, Ergocalciferol, (DRISDOL) 1.25 MG (50000 UNIT) CAPS capsule Take 1 capsule (50,000 Units total) by mouth once a week for 12 weeks, then recheck level in the office.   No current facility-administered medications on file prior to visit.     No Known Allergies  Review Of Systems:  Constitutional:   No  weight loss, night sweats,  Fevers, chills, fatigue, or  lassitude.  HEENT:   No headaches,  Difficulty swallowing,  Tooth/dental problems, or  Sore throat,                No sneezing, itching, ear ache, nasal congestion, post nasal drip,   CV:  No chest pain,  Orthopnea, PND, swelling in lower extremities, anasarca, dizziness, palpitations, syncope.   GI  No heartburn, indigestion, abdominal pain, nausea, vomiting, diarrhea, change in bowel habits, loss of appetite, bloody stools.   Resp: Baseline  shortness of breath with exertion less at rest.   +  baseline  excess mucus, + baseline  productive cough,  No non-productive cough,  No coughing up of blood.  No change in color of mucus.  + occasional  wheezing.  No chest wall deformity  Skin: no rash or lesions.  GU: no dysuria, change in color  of urine, no urgency or frequency.  No flank pain, no hematuria   MS:  No joint pain or swelling.  No decreased range of motion.  No back pain.  Psych:  No change in mood or affect. No depression or anxiety.  No memory loss.   Vital Signs BP (!) 156/120 (BP Location: Left Arm, Patient Position: Sitting, Cuff Size: Normal)   Pulse 63   Ht 6' (1.829 m)   Wt 156 lb 12.8 oz (71.1 kg)   SpO2 96%   BMI 21.27 kg/m    Physical Exam:  General- No distress,  A&Ox3, pleasant and appropriate ENT: No sinus tenderness, TM clear, pale nasal mucosa, no oral exudate,no post nasal drip, no LAN Cardiac: S1, S2, regular rate and rhythm, no murmur Chest: No wheeze/ rales/ dullness; no accessory muscle use, no nasal flaring, no sternal retractions, diminished per bases Abd.: Soft Non-tender, nondistended, bowel sounds positive,Body mass index is 21.27 kg/m.  Ext: No clubbing cyanosis, edema Neuro:  normal strength, moving all extremities x 4, alert and oriented x 3, appropriate Skin: No rashes, warm and dry, no obvious skin lesions Psych: normal mood and behavior   Assessment/Plan Stable pulmonary nodules and a current everyday smoker with impaired immune system Plan Your CT chest we reviewed today is stable from the previous scan done  08/2022 and 09/2022.  Plan will be for a 3 month follow up CT Chest. If this shows continued stable nodules, we will then increase spacing of imaging to 6 months. Continue using Trelegy 1 puff once daily Rinse mouth after use I have re-ordered your albuterol.  Use this as needed 1-2 puffs  for shortness of breath or wheezing.  If you find you have to use this more than 4 times a day, call to be seen. Note your daily symptoms > remember "red flags" for COPD:  Increase in cough, increase in sputum production, increase in shortness of breath or activity intolerance. If you notice these symptoms, please call to be seen.  Please contact office for sooner follow up if  symptoms do not improve or worsen or seek emergency care   You can receive free nicotine replacement therapy (patches, gum, or mints) by calling 1-800-QUIT NOW. Please call so we can get you on the path to becoming a non-smoker. I know it is hard, but you can do this!  Hypnosis for smoking cessation  [employer] [phone]  Acupuncture for smoking cessation  [employer] [phone]     I spent 25 minutes dedicated to the care of this patient on the date of this encounter to include pre-visit review of records, face-to-face time with the patient discussing conditions above, post visit ordering of testing, clinical documentation with the electronic health record, making appropriate referrals as documented, and communicating necessary information to the patient's healthcare team.   Blood pressure was slightly elevated today in the office.  I have reminded the patient on importance of taking his blood pressure medication and have asked him to follow-up with his primary care to ensure blood pressure is well-controlled.   Bevelyn Ngo, NP 03/14/2023  12:19 PM

## 2023-03-15 ENCOUNTER — Other Ambulatory Visit: Payer: Self-pay

## 2023-03-15 ENCOUNTER — Other Ambulatory Visit (HOSPITAL_COMMUNITY): Payer: Self-pay

## 2023-03-15 MED FILL — Montelukast Sodium Tab 10 MG (Base Equiv): ORAL | 30 days supply | Qty: 30 | Fill #1 | Status: AC

## 2023-03-17 ENCOUNTER — Other Ambulatory Visit (HOSPITAL_COMMUNITY): Payer: Self-pay

## 2023-03-18 ENCOUNTER — Encounter: Payer: Self-pay | Admitting: Acute Care

## 2023-03-18 ENCOUNTER — Inpatient Hospital Stay (HOSPITAL_COMMUNITY)
Admission: EM | Admit: 2023-03-18 | Discharge: 2023-03-20 | DRG: 191 | Disposition: A | Discharge status: Home / Self Care | Payer: 59 | Attending: Internal Medicine | Admitting: Internal Medicine

## 2023-03-18 ENCOUNTER — Encounter (HOSPITAL_COMMUNITY): Payer: Self-pay | Admitting: *Deleted

## 2023-03-18 ENCOUNTER — Emergency Department (HOSPITAL_COMMUNITY): Payer: 59

## 2023-03-18 ENCOUNTER — Other Ambulatory Visit: Payer: Self-pay

## 2023-03-18 DIAGNOSIS — I160 Hypertensive urgency: Secondary | ICD-10-CM | POA: Diagnosis not present

## 2023-03-18 DIAGNOSIS — Z1152 Encounter for screening for COVID-19: Secondary | ICD-10-CM | POA: Diagnosis not present

## 2023-03-18 DIAGNOSIS — G8929 Other chronic pain: Secondary | ICD-10-CM | POA: Diagnosis not present

## 2023-03-18 DIAGNOSIS — I739 Peripheral vascular disease, unspecified: Secondary | ICD-10-CM | POA: Diagnosis not present

## 2023-03-18 DIAGNOSIS — Z7982 Long term (current) use of aspirin: Secondary | ICD-10-CM

## 2023-03-18 DIAGNOSIS — E785 Hyperlipidemia, unspecified: Secondary | ICD-10-CM | POA: Diagnosis present

## 2023-03-18 DIAGNOSIS — M109 Gout, unspecified: Secondary | ICD-10-CM | POA: Diagnosis not present

## 2023-03-18 DIAGNOSIS — M792 Neuralgia and neuritis, unspecified: Secondary | ICD-10-CM

## 2023-03-18 DIAGNOSIS — Z8249 Family history of ischemic heart disease and other diseases of the circulatory system: Secondary | ICD-10-CM

## 2023-03-18 DIAGNOSIS — I5032 Chronic diastolic (congestive) heart failure: Secondary | ICD-10-CM | POA: Diagnosis not present

## 2023-03-18 DIAGNOSIS — I169 Hypertensive crisis, unspecified: Secondary | ICD-10-CM | POA: Diagnosis not present

## 2023-03-18 DIAGNOSIS — Z85528 Personal history of other malignant neoplasm of kidney: Secondary | ICD-10-CM | POA: Diagnosis not present

## 2023-03-18 DIAGNOSIS — I2489 Other forms of acute ischemic heart disease: Secondary | ICD-10-CM | POA: Diagnosis not present

## 2023-03-18 DIAGNOSIS — F17210 Nicotine dependence, cigarettes, uncomplicated: Secondary | ICD-10-CM | POA: Diagnosis not present

## 2023-03-18 DIAGNOSIS — N1832 Chronic kidney disease, stage 3b: Secondary | ICD-10-CM | POA: Diagnosis not present

## 2023-03-18 DIAGNOSIS — I130 Hypertensive heart and chronic kidney disease with heart failure and stage 1 through stage 4 chronic kidney disease, or unspecified chronic kidney disease: Secondary | ICD-10-CM | POA: Diagnosis present

## 2023-03-18 DIAGNOSIS — Z820 Family history of epilepsy and other diseases of the nervous system: Secondary | ICD-10-CM

## 2023-03-18 DIAGNOSIS — I480 Paroxysmal atrial fibrillation: Secondary | ICD-10-CM | POA: Diagnosis not present

## 2023-03-18 DIAGNOSIS — Z7951 Long term (current) use of inhaled steroids: Secondary | ICD-10-CM

## 2023-03-18 DIAGNOSIS — Z8269 Family history of other diseases of the musculoskeletal system and connective tissue: Secondary | ICD-10-CM

## 2023-03-18 DIAGNOSIS — M549 Dorsalgia, unspecified: Secondary | ICD-10-CM | POA: Diagnosis present

## 2023-03-18 DIAGNOSIS — R7989 Other specified abnormal findings of blood chemistry: Secondary | ICD-10-CM | POA: Diagnosis present

## 2023-03-18 DIAGNOSIS — Z79899 Other long term (current) drug therapy: Secondary | ICD-10-CM | POA: Diagnosis not present

## 2023-03-18 DIAGNOSIS — B20 Human immunodeficiency virus [HIV] disease: Secondary | ICD-10-CM | POA: Diagnosis present

## 2023-03-18 DIAGNOSIS — D631 Anemia in chronic kidney disease: Secondary | ICD-10-CM | POA: Diagnosis not present

## 2023-03-18 DIAGNOSIS — B029 Zoster without complications: Secondary | ICD-10-CM | POA: Diagnosis not present

## 2023-03-18 DIAGNOSIS — R0602 Shortness of breath: Secondary | ICD-10-CM | POA: Diagnosis not present

## 2023-03-18 DIAGNOSIS — I2510 Atherosclerotic heart disease of native coronary artery without angina pectoris: Secondary | ICD-10-CM | POA: Diagnosis present

## 2023-03-18 DIAGNOSIS — B0229 Other postherpetic nervous system involvement: Secondary | ICD-10-CM | POA: Diagnosis present

## 2023-03-18 DIAGNOSIS — I161 Hypertensive emergency: Secondary | ICD-10-CM | POA: Diagnosis not present

## 2023-03-18 DIAGNOSIS — J9601 Acute respiratory failure with hypoxia: Secondary | ICD-10-CM | POA: Diagnosis not present

## 2023-03-18 DIAGNOSIS — J441 Chronic obstructive pulmonary disease with (acute) exacerbation: Secondary | ICD-10-CM | POA: Diagnosis not present

## 2023-03-18 DIAGNOSIS — G4733 Obstructive sleep apnea (adult) (pediatric): Secondary | ICD-10-CM | POA: Diagnosis not present

## 2023-03-18 LAB — CBC
HCT: 34.2 % — ABNORMAL LOW (ref 39.0–52.0)
Hemoglobin: 11.5 g/dL — ABNORMAL LOW (ref 13.0–17.0)
MCH: 31.7 pg (ref 26.0–34.0)
MCHC: 33.6 g/dL (ref 30.0–36.0)
MCV: 94.2 fL (ref 80.0–100.0)
Platelets: 300 10*3/uL (ref 150–400)
RBC: 3.63 MIL/uL — ABNORMAL LOW (ref 4.22–5.81)
RDW: 14.9 % (ref 11.5–15.5)
WBC: 8.7 10*3/uL (ref 4.0–10.5)
nRBC: 0 % (ref 0.0–0.2)

## 2023-03-18 LAB — BASIC METABOLIC PANEL
Anion gap: 7 (ref 5–15)
BUN: 13 mg/dL (ref 8–23)
CO2: 22 mmol/L (ref 22–32)
Calcium: 8.7 mg/dL — ABNORMAL LOW (ref 8.9–10.3)
Chloride: 112 mmol/L — ABNORMAL HIGH (ref 98–111)
Creatinine, Ser: 1.75 mg/dL — ABNORMAL HIGH (ref 0.61–1.24)
GFR, Estimated: 43 mL/min — ABNORMAL LOW (ref >60)
Glucose, Bld: 93 mg/dL (ref 70–99)
Potassium: 4.1 mmol/L (ref 3.5–5.1)
Sodium: 141 mmol/L (ref 135–145)

## 2023-03-18 LAB — I-STAT VENOUS BLOOD GAS, ED
Acid-base deficit: 2 mmol/L (ref 0.0–2.0)
Bicarbonate: 24 mmol/L (ref 20.0–28.0)
Calcium, Ion: 1.19 mmol/L (ref 1.15–1.40)
HCT: 36 % — ABNORMAL LOW (ref 39.0–52.0)
Hemoglobin: 12.2 g/dL — ABNORMAL LOW (ref 13.0–17.0)
O2 Saturation: 72 %
Potassium: 4.7 mmol/L (ref 3.5–5.1)
Sodium: 144 mmol/L (ref 135–145)
TCO2: 25 mmol/L (ref 22–32)
pCO2, Ven: 45.7 mm[Hg] (ref 44–60)
pH, Ven: 7.328 (ref 7.25–7.43)
pO2, Ven: 41 mm[Hg] (ref 32–45)

## 2023-03-18 LAB — RESP PANEL BY RT-PCR (RSV, FLU A&B, COVID)  RVPGX2
Influenza A by PCR: NEGATIVE
Influenza B by PCR: NEGATIVE
Resp Syncytial Virus by PCR: NEGATIVE
SARS Coronavirus 2 by RT PCR: NEGATIVE

## 2023-03-18 LAB — TROPONIN I (HIGH SENSITIVITY)
Troponin I (High Sensitivity): 38 ng/L — ABNORMAL HIGH (ref <18)
Troponin I (High Sensitivity): 37 ng/L — ABNORMAL HIGH (ref <18)

## 2023-03-18 MED ORDER — MAGNESIUM SULFATE 2 GM/50ML IV SOLN
2.0000 g | Freq: Once | INTRAVENOUS | Status: AC
  Administered 2023-03-18: 2 g via INTRAVENOUS
  Filled 2023-03-18: qty 50

## 2023-03-18 MED ORDER — IPRATROPIUM-ALBUTEROL 0.5-2.5 (3) MG/3ML IN SOLN
3.0000 mL | Freq: Once | RESPIRATORY_TRACT | Status: AC
  Administered 2023-03-18: 3 mL via RESPIRATORY_TRACT
  Filled 2023-03-18: qty 3

## 2023-03-18 MED ORDER — GABAPENTIN 300 MG PO CAPS
300.0000 mg | ORAL_CAPSULE | Freq: Three times a day (TID) | ORAL | 0 refills | Status: DC | PRN
  Filled 2023-03-18: qty 90, 30d supply, fill #0

## 2023-03-18 MED ORDER — HYDRALAZINE HCL 25 MG PO TABS
100.0000 mg | ORAL_TABLET | Freq: Once | ORAL | Status: AC
  Administered 2023-03-18: 100 mg via ORAL
  Filled 2023-03-18 (×2): qty 4

## 2023-03-18 MED ORDER — SODIUM CHLORIDE 0.9 % IV BOLUS
1000.0000 mL | Freq: Once | INTRAVENOUS | Status: AC
  Administered 2023-03-18: 1000 mL via INTRAVENOUS

## 2023-03-18 MED ORDER — LABETALOL HCL 5 MG/ML IV SOLN
10.0000 mg | Freq: Once | INTRAVENOUS | Status: AC
  Administered 2023-03-18: 10 mg via INTRAVENOUS
  Filled 2023-03-18: qty 4

## 2023-03-18 MED ORDER — NITROGLYCERIN IN D5W 200-5 MCG/ML-% IV SOLN
0.0000 ug/min | INTRAVENOUS | Status: DC
  Administered 2023-03-19: 75 ug/min via INTRAVENOUS
  Administered 2023-03-19: 50 ug/min via INTRAVENOUS
  Filled 2023-03-18 (×2): qty 250

## 2023-03-18 MED ORDER — ALBUTEROL SULFATE (2.5 MG/3ML) 0.083% IN NEBU
10.0000 mg/h | INHALATION_SOLUTION | Freq: Once | RESPIRATORY_TRACT | Status: AC
  Administered 2023-03-18: 10 mg/h via RESPIRATORY_TRACT
  Filled 2023-03-18: qty 3
  Filled 2023-03-18: qty 12

## 2023-03-18 MED ORDER — METHYLPREDNISOLONE SODIUM SUCC 125 MG IJ SOLR
125.0000 mg | Freq: Once | INTRAMUSCULAR | Status: AC
  Administered 2023-03-18: 125 mg via INTRAVENOUS
  Filled 2023-03-18: qty 2

## 2023-03-18 MED ORDER — MORPHINE SULFATE (PF) 4 MG/ML IV SOLN
4.0000 mg | Freq: Once | INTRAVENOUS | Status: AC
Start: 2023-03-18 — End: 2023-03-18
  Administered 2023-03-18: 4 mg via INTRAVENOUS
  Filled 2023-03-18: qty 1

## 2023-03-18 NOTE — ED Notes (Signed)
Pt not msed in triage  calls were made no one showed up

## 2023-03-18 NOTE — ED Provider Notes (Signed)
[facility] Provider Note  CSN: [id number] Arrival date & time: 03/18/23 1922  Chief Complaint(s) Shortness of Breath and Herpes Zoster  HPI Kevin [name] is a 68 y.o. male with history of well-controlled HIV, CKD, COPD, CHF, atrial fibrillation not on anticoagulation presenting to the emergency department with shortness of breath.  Patient reports shortness of breath starting today, also reports some chest tightness.  Reports wheezing as well.  Uses albuterol nebulizer at home without significant improvement.  No fevers or chills.  Reports some dry cough.  No leg swelling, abdominal pain, back pain.  Also reports that he has had shingles recently, initially had some blisters that have turned into some scars.  He reports that he has some shooting pain there that has been persistent and he wants this evaluated as well   Past Medical History Past Medical History:  Diagnosis Date   AIDS (acquired immune deficiency syndrome) (HCC) 08/17/2016   Amaurosis fugax 08/18/2022   Chronic diastolic CHF (congestive heart failure), NYHA class 3 (HCC) 01/2016   Chronic lower back pain    CKD (chronic kidney disease), stage III (HCC)    COPD (chronic obstructive pulmonary disease) (HCC)    Gout    "forearms, hands, ankles, feet" (06/05/2016)   Headache    "weekly" (06/05/2016)   Heart murmur    Hypertension    Hypertensive crisis 08/15/2017   Lipoma 07/06/2022   OSA on CPAP    PAD (peripheral artery disease) (HCC)    PAF (paroxysmal atrial fibrillation) (HCC) 01/2016   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   Patient Active Problem List   Diagnosis Date Noted   COPD with acute exacerbation (HCC) 03/19/2023   COPD exacerbation (HCC) 12/21/2022   Anxiety 12/19/2022   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   Skin nodule 08/08/2022   Lung nodule, multiple 07/26/2022   Lipoma 07/06/2022   Chronic low back pain 04/29/2022   Chronic heart failure with preserved ejection  fraction (HFpEF) (HCC) 12/27/2021   HLD (hyperlipidemia) 12/27/2021   Elevated troponin 12/27/2021   Right renal mass 06/29/2021   Renal lesion 06/29/2021   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   Acute respiratory failure with hypoxia (HCC)    Callus of foot 07/10/2018   Hypertensive urgency 08/15/2017   CAD (coronary artery disease) 10/19/2016   Easy bruising 08/11/2016   Claudication in peripheral vascular disease (HCC) 06/05/2016   CKD stage 3b, GFR 30-44 ml/min (HCC) - baseline SCr 1.8-2.2    Peripheral arterial disease (HCC) 05/09/2016   OSA (obstructive sleep apnea) 03/24/2016   Essential hypertension 02/18/2016   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   Blurry vision, bilateral    HIV (human immunodeficiency virus infection) (HCC) 08/27/2004   Home Medication(s) Prior to Admission medications   Medication Sig Start Date End Date Taking? Authorizing Provider  albuterol (VENTOLIN HFA) 108 (90 Base) MCG/ACT inhaler Inhale 1-2 puffs into the lungs every 6 (six) hours as needed for wheezing or shortness of breath. 03/14/23  Yes Bevelyn Ngo, NP  amLODipine-valsartan (EXFORGE) 10-320 MG tablet Take 1 tablet by mouth daily. 11/17/22  Yes Corrin Parker, PA-C  aspirin EC 81 MG tablet Take 1 tablet (81 mg total) by mouth daily, swallow whole 04/28/22  Yes [name], MD  atorvastatin (LIPITOR) 80 MG tablet Take 1 tablet (80 mg total) by mouth daily. 02/08/23  Yes Alver Sorrow, NP  busPIRone (BUSPAR) 7.5 MG tablet Take 1 tablet (7.5 mg total) by mouth 2 (two) times daily. 03/12/23 06/10/23 Yes [name], MD  dolutegravir-lamiVUDine (DOVATO) 50-300 MG tablet Take 1 tablet by mouth  daily. 07/06/22  Yes [name], [name], MD  Fluticasone-Umeclidin-Vilant (TRELEGY ELLIPTA) 200-62.5-25 MCG/ACT AEPB Inhale 1 puff into the lungs daily. 09/08/22  Yes   gabapentin (NEURONTIN) 300 MG capsule Take 1 capsule (300 mg total) by mouth 3 (three) times daily as needed (shingles pain). 03/18/23  Yes Lonell Grandchild, MD  hydrALAZINE (APRESOLINE) 100 MG tablet Take 1 tablet (100 mg total) by mouth 3 (three) times daily. 02/08/23  Yes Alver Sorrow, NP  ipratropium-albuterol (DUONEB) 0.5-2.5 (3) MG/3ML SOLN Take 3 mLs by nebulization every 6 (six) hours as needed. Patient taking differently: Take 3 mLs by nebulization every 6 (six) hours as needed (for wheezing and shortness of breath). 02/27/23  Yes Young, Joni Fears D, MD  isosorbide mononitrate (IMDUR) 60 MG 24 hr tablet Take 1 tablet (60 mg total) by mouth daily. 02/08/23 05/09/23 Yes Alver Sorrow, NP  montelukast (SINGULAIR) 10 MG tablet Take 1 tablet (10 mg total) by mouth at bedtime. 02/15/23  Yes Cobb, Ruby Cola, NP  nebivolol (BYSTOLIC) 5 MG tablet Take 1 tablet (5 mg total) by mouth daily. 02/08/23  Yes Alver Sorrow, NP  naloxone [facility]) nasal spray 4 mg/0.1 mL Call 911. Spray contents of 1 spray into one nostril. Repeat in 2-3 min if symptoms of opioid emergency persists; alternate nostrils Patient not taking: Reported on 03/19/2023 10/20/22     triamcinolone cream (KENALOG) 0.1 % Apply 1 Application topically 2 (two) times daily. Apply a small amount twice daily to the affected areas for 10 days and then discontinue 01/13/23   White, Lanora Manis A, PA-C  Vitamin D, Ergocalciferol, (DRISDOL) 1.25 MG (50000 UNIT) CAPS capsule Take 1 capsule (50,000 Units total) by mouth once a week for 12 weeks, then recheck level in the office. Patient not taking: Reported on 03/19/2023 03/01/23                                                                                                                                       Past Surgical  History Past Surgical History:  Procedure Laterality Date   IR RADIOLOGIST EVAL & MGMT  05/31/2021   IR RADIOLOGIST EVAL & MGMT  07/26/2021   LEFT HEART CATH AND CORONARY ANGIOGRAPHY N/A 10/23/2016   Procedure: LEFT HEART CATH AND CORONARY ANGIOGRAPHY;  Surgeon: Marykay Lex, MD;  Location: [facility] INVASIVE CV LAB;  Service: Cardiovascular;  Laterality: N/A;   LOWER EXTREMITY ANGIOGRAPHY N/A 07/17/2016   Procedure: Lower Extremity Angiography;  Surgeon: Runell Gess, MD;  Location: [facility] INVASIVE CV LAB;  Service: Cardiovascular;  Laterality: N/A;   LOWER EXTREMITY INTERVENTION N/A 06/05/2016   Procedure: Lower Extremity Intervention;  Surgeon: Runell Gess, MD;  Location: [facility] INVASIVE CV LAB;  Service: Cardiovascular;  Laterality: N/A;   PERIPHERAL VASCULAR ATHERECTOMY Right 07/17/2016   Procedure: Peripheral Vascular Atherectomy;  Surgeon: Runell Gess, MD;  Location: [facility] INVASIVE CV LAB;  Service: Cardiovascular;  Laterality: Right;  SFA   PERIPHERAL VASCULAR INTERVENTION  06/05/2016   Procedure: Peripheral Vascular Intervention;  Surgeon: Runell Gess, MD;  Location: [facility] INVASIVE CV LAB;  Service: Cardiovascular;;  left SFA   RADIOLOGY WITH ANESTHESIA Right 06/29/2021   Procedure: RIGHT RENAL CRYOABLATION;  Surgeon: Richarda Overlie, MD;  Location: [facility] ORS;  Service: Anesthesiology;  Laterality: Right;   Family History Family History  Problem Relation Age of Onset   High blood pressure Mother    Lupus Mother    Seizures Daughter    Heart attack Daughter     Social History Social History   Tobacco Use   Smoking status: Every Day    Current packs/day: 0.10    Average packs/day: 0.1 packs/day for 42.0 years (4.2 ttl pk-yrs)    Types: Cigarettes, E-cigarettes    Passive exposure: Never   Smokeless tobacco: Never   Tobacco comments:    1-2 cigarettes a day 09/29/22 [place]  Vaping Use   Vaping status: Every Day   Substances: Nicotine, Flavoring  Substance Use Topics   Alcohol use: Not  Currently    Alcohol/week: 2.0 standard drinks of alcohol    Types: 2 Cans of beer per week    Comment: rare   Drug use: Not Currently    Comment: rare   Allergies Patient has no known allergies.  Review of Systems Review of Systems  All other systems reviewed and are negative.   Physical Exam Vital Signs  I have reviewed the triage vital signs BP (!) 180/95 (BP Location: Left Arm)   Pulse 69   Temp 98.2 F (36.8 C) (Oral)   Resp 16   Ht 6' (1.829 m)   Wt 71.1 kg   SpO2 100%   BMI 21.26 kg/m  Physical Exam Vitals and nursing note reviewed.  Constitutional:      General: He is in acute distress.     Appearance: Normal appearance.  HENT:     Mouth/Throat:     Mouth: Mucous membranes are moist.  Eyes:     Conjunctiva/sclera: Conjunctivae normal.  Cardiovascular:     Rate and Rhythm: Normal rate and regular rhythm.  Pulmonary:     Effort: Tachypnea, accessory muscle usage and respiratory distress present.     Breath sounds: Examination of the right-upper field reveals wheezing. Examination of the left-upper field reveals wheezing. Examination of the right-middle field reveals wheezing. Examination of the left-middle field reveals wheezing. Examination of the right-lower field reveals wheezing. Examination of the left-lower field reveals wheezing. Wheezing present.  Abdominal:     General: Abdomen is flat.     Palpations: Abdomen is soft.     Tenderness: There is no abdominal tenderness.  Musculoskeletal:     Right lower leg: No edema.     Left lower leg: No edema.  Skin:    General: Skin is warm and dry.     Capillary Refill: Capillary refill takes less than 2 seconds.     Comments: On the left inner thigh, there are a few scattered patches of darker skin which could represent prior areas of active shingles,  but currently no vesicles or erythema or other rash.  Neurological:     Mental Status: He is alert and oriented to person, place, and time. Mental status is at  baseline.  Psychiatric:        Mood and Affect: Mood normal.        Behavior: Behavior normal.     ED Results and Treatments Labs (all labs ordered are listed, but only abnormal results are displayed) Labs Reviewed  BASIC METABOLIC PANEL - Abnormal; Notable for the following components:      Result Value   Chloride 112 (*)    Creatinine, Ser 1.75 (*)    Calcium 8.7 (*)    GFR, Estimated 43 (*)    All other components within normal limits  CBC - Abnormal; Notable for the following components:   RBC 3.63 (*)    Hemoglobin 11.5 (*)    HCT 34.2 (*)    All other components within normal limits  COMPREHENSIVE METABOLIC PANEL - Abnormal; Notable for the following components:   Chloride 114 (*)    CO2 19 (*)    Glucose, Bld 184 (*)    Creatinine, Ser 1.68 (*)    Calcium 8.5 (*)    Total Protein 6.4 (*)    Albumin 3.3 (*)    GFR, Estimated 45 (*)    All other components within normal limits  I-STAT VENOUS BLOOD GAS, ED - Abnormal; Notable for the following components:   HCT 36.0 (*)    Hemoglobin 12.2 (*)    All other components within normal limits  TROPONIN I (HIGH SENSITIVITY) - Abnormal; Notable for the following components:   Troponin I (High Sensitivity) 38 (*)    All other components within normal limits  TROPONIN I (HIGH SENSITIVITY) - Abnormal; Notable for the following components:   Troponin I (High Sensitivity) 37 (*)    All other components within normal limits  RESP PANEL BY RT-PCR (RSV, FLU A&B, COVID)  RVPGX2  BRAIN NATRIURETIC PEPTIDE                                                                                                                          Radiology No results found.   Pertinent labs & imaging results that were available during my care of the patient were reviewed by me and considered in my medical decision making (see MDM for details).  Medications Ordered in ED Medications  nitroGLYCERIN 50 mg in dextrose 5 % 250 mL (0.2 mg/mL) infusion (0  mcg/min Intravenous Stopped 03/19/23 1219)  fluticasone furoate-vilanterol (BREO ELLIPTA) 200-25 MCG/ACT 1 puff (1 puff Inhalation Given 03/20/23 0740)    And  umeclidinium bromide (INCRUSE ELLIPTA) 62.5 MCG/ACT 1 puff (1 puff Inhalation Given 03/20/23 0740)  ipratropium-albuterol (DUONEB) 0.5-2.5 (3) MG/3ML nebulizer solution 3 mL (has no administration in time range)  montelukast (SINGULAIR) tablet 10 mg (10 mg Oral Given 03/19/23 2049)  enoxaparin (LOVENOX) injection 40 mg (40 mg Subcutaneous Given 03/20/23 1000)  acetaminophen (TYLENOL) tablet 650 mg (has no administration in time range)    Or  acetaminophen (TYLENOL) suppository 650 mg (has no administration in time range)  azithromycin (ZITHROMAX) 500 mg in sodium chloride 0.9 % 250 mL IVPB (0 mg Intravenous Stopped 03/19/23 0436)    Followed by  azithromycin (ZITHROMAX) tablet 500 mg (500 mg Oral Given 03/20/23 0554)  predniSONE (DELTASONE) tablet 40 mg (40 mg Oral Given 03/20/23 1000)  amLODipine (NORVASC) tablet 10 mg (10 mg Oral Given 03/20/23 1000)  irbesartan (AVAPRO) tablet 150 mg (150 mg Oral Given 03/20/23 1000)  isosorbide mononitrate (IMDUR) 24 hr tablet 60 mg (60 mg Oral Given 03/20/23 1000)  gabapentin (NEURONTIN) capsule 100 mg (100 mg Oral Given 03/20/23 1000)  morphine (PF) 2 MG/ML injection 1 mg (1 mg Intravenous Given 03/20/23 0557)  ipratropium-albuterol (DUONEB) 0.5-2.5 (3) MG/3ML nebulizer solution 3 mL (3 mLs Nebulization Given 03/18/23 2117)  methylPREDNISolone sodium succinate (SOLU-MEDROL) 125 mg/2 mL injection 125 mg (125 mg Intravenous Given 03/18/23 2123)  magnesium sulfate IVPB 2 g 50 mL (0 g Intravenous Stopped 03/18/23 2245)  sodium chloride 0.9 % bolus 1,000 mL (0 mLs Intravenous Stopped 03/18/23 2245)  morphine (PF) 4 MG/ML injection 4 mg (4 mg Intravenous Given 03/18/23 2127)  albuterol (PROVENTIL) (2.5 MG/3ML) 0.083% nebulizer solution (10 mg/hr Nebulization Given 03/18/23 2229)  hydrALAZINE (APRESOLINE) tablet 100 mg (100 mg Oral Given  03/18/23 2227)  labetalol (NORMODYNE) injection 10 mg (10 mg Intravenous Given 03/18/23 2332)  LORazepam (ATIVAN) injection 1 mg (1 mg Intravenous Given 03/19/23 0027)  HYDROcodone-acetaminophen (NORCO/VICODIN) 5-325 MG per tablet 1 tablet (1 tablet Oral Given 03/19/23 2139)                                                                                                                                     Procedures .Critical Care  Performed by: Lonell Grandchild, MD Authorized by: Lonell Grandchild, MD   Critical care provider statement:    Critical care time (minutes):  30   Critical care was necessary to treat or prevent imminent or life-threatening deterioration of the following conditions:  Respiratory failure   Critical care was time spent personally by me on the following activities:  Development of treatment plan with patient or surrogate, discussions with consultants, evaluation of patient's response to treatment, examination of patient, ordering and review of laboratory studies, ordering and review of radiographic studies, ordering and performing treatments and interventions, pulse oximetry, re-evaluation of patient's condition and review of old charts   (including critical care time)  Medical Decision Making / ED Course   MDM:  68 year old presenting to the emergency department shortness of breath.  Patient no acute distress with respiratory distress, tachypnea, sensory muscle use.  Not hypoxic.  Exam with diffuse wheezing.  Suspect COPD exacerbation.  Chest x-ray clear without infiltrate, pneumonia, pneumothorax.  No clinical findings concerning for CHF exacerbation.  He reports some chest  tightness, troponin is slightly elevated although this seems similar to previous and is in the setting of chronic kidney disease, EKG without any acute changes.  Labs with no anemia.  No hypercapnia on venous blood gas.  Reports some cough, will check flu and COVID swab.  Symptoms are somewhat  better after receiving DuoNeb, steroids, magnesium.  Will give additional hour-long neb.  Will reassess.  Given work of breathing may need admission if does not improve.  He also reports some chronic pain related to prior shingles infection.  On exam, he has no active lesions.  Will give dose of pain control in the emergency department.  Clinical Course as of 03/20/23 1008  Sun Mar 18, 2023  2326 Signed out to Dr. Durwin Nora pending re-assessment  [WS]    Clinical Course User Index [WS] Suezanne Jacquet Jerilee Field, MD     Additional history obtained: -External records from outside source obtained and reviewed including: Chart review including previous notes, labs, imaging, consultation notes including prior records    Lab Tests: -I ordered, reviewed, and interpreted labs.   The pertinent results include:   Labs Reviewed  BASIC METABOLIC PANEL - Abnormal; Notable for the following components:      Result Value   Chloride 112 (*)    Creatinine, Ser 1.75 (*)    Calcium 8.7 (*)    GFR, Estimated 43 (*)    All other components within normal limits  CBC - Abnormal; Notable for the following components:   RBC 3.63 (*)    Hemoglobin 11.5 (*)    HCT 34.2 (*)    All other components within normal limits  COMPREHENSIVE METABOLIC PANEL - Abnormal; Notable for the following components:   Chloride 114 (*)    CO2 19 (*)    Glucose, Bld 184 (*)    Creatinine, Ser 1.68 (*)    Calcium 8.5 (*)    Total Protein 6.4 (*)    Albumin 3.3 (*)    GFR, Estimated 45 (*)    All other components within normal limits  I-STAT VENOUS BLOOD GAS, ED - Abnormal; Notable for the following components:   HCT 36.0 (*)    Hemoglobin 12.2 (*)    All other components within normal limits  TROPONIN I (HIGH SENSITIVITY) - Abnormal; Notable for the following components:   Troponin I (High Sensitivity) 38 (*)    All other components within normal limits  TROPONIN I (HIGH SENSITIVITY) - Abnormal; Notable for the following  components:   Troponin I (High Sensitivity) 37 (*)    All other components within normal limits  RESP PANEL BY RT-PCR (RSV, FLU A&B, COVID)  RVPGX2  BRAIN NATRIURETIC PEPTIDE    Notable for mild troponin elevation likely demand   EKG   EKG Interpretation Date/Time:  Sunday March 18 2023 19:37:22 EST Ventricular Rate:  85 PR Interval:  168 QRS Duration:  90 QT Interval:  376 QTC Calculation: 447 R Axis:   79  Text Interpretation: Normal sinus rhythm Moderate voltage criteria for LVH, may be normal variant ( Sokolow-Lyon , Cornell product ) Septal infarct , age undetermined ST & T wave abnormality, consider inferior ischemia Abnormal ECG When compared with ECG of 04-Jan-2023 12:08, No significant change since last tracing Confirmed by Alvino Blood ([id number]) on 03/18/2023 10:42:18 PM         Imaging Studies ordered: I ordered imaging studies including CXR On my interpretation imaging demonstrates no acute process I independently visualized and interpreted imaging. I agree with  the radiologist interpretation   Medicines ordered and prescription drug management: Meds ordered this encounter  Medications   ipratropium-albuterol (DUONEB) 0.5-2.5 (3) MG/3ML nebulizer solution 3 mL   methylPREDNISolone sodium succinate (SOLU-MEDROL) 125 mg/2 mL injection 125 mg    IV methylprednisolone will be converted to either a q12h or q24h frequency with the same total daily dose (TDD).  Ordered Dose: 1 to 125 mg TDD; convert to: TDD q24h.  Ordered Dose: 126 to 250 mg TDD; convert to: TDD div q12h.  Ordered Dose: >250 mg TDD; DAW.   magnesium sulfate IVPB 2 g 50 mL   sodium chloride 0.9 % bolus 1,000 mL   morphine (PF) 4 MG/ML injection 4 mg   albuterol (PROVENTIL) (2.5 MG/3ML) 0.083% nebulizer solution   hydrALAZINE (APRESOLINE) tablet 100 mg   labetalol (NORMODYNE) injection 10 mg   gabapentin (NEURONTIN) 300 MG capsule    Sig: Take 1 capsule (300 mg total) by mouth 3 (three) times  daily as needed (shingles pain).    Dispense:  90 capsule    Refill:  0   nitroGLYCERIN 50 mg in dextrose 5 % 250 mL (0.2 mg/mL) infusion   LORazepam (ATIVAN) injection 1 mg   AND Linked Order Group    fluticasone furoate-vilanterol (BREO ELLIPTA) 200-25 MCG/ACT 1 puff    umeclidinium bromide (INCRUSE ELLIPTA) 62.5 MCG/ACT 1 puff   ipratropium-albuterol (DUONEB) 0.5-2.5 (3) MG/3ML nebulizer solution 3 mL   montelukast (SINGULAIR) tablet 10 mg   enoxaparin (LOVENOX) injection 40 mg   OR Linked Order Group    acetaminophen (TYLENOL) tablet 650 mg    acetaminophen (TYLENOL) suppository 650 mg   FOLLOWED BY Linked Order Group    azithromycin (ZITHROMAX) 500 mg in sodium chloride 0.9 % 250 mL IVPB     Antibiotic Indication::   Other Indication (list below)     Other Indication::   COPD exacerbation    azithromycin (ZITHROMAX) tablet 500 mg   predniSONE (DELTASONE) tablet 40 mg   amLODipine (NORVASC) tablet 10 mg   irbesartan (AVAPRO) tablet 150 mg   isosorbide mononitrate (IMDUR) 24 hr tablet 60 mg   HYDROcodone-acetaminophen (NORCO/VICODIN) 5-325 MG per tablet 1 tablet    Refill:  0   gabapentin (NEURONTIN) capsule 100 mg   morphine (PF) 2 MG/ML injection 1 mg    -I have reviewed the patients home medicines and have made adjustments as needed    Cardiac Monitoring: The patient was maintained on a cardiac monitor.  I personally viewed and interpreted the cardiac monitored which showed an underlying rhythm of: NSR  Social Determinants of Health:  Diagnosis or treatment significantly limited by social determinants of health: current smoker   Reevaluation: After the interventions noted above, I reevaluated the patient and found that their symptoms have improved  Co morbidities that complicate the patient evaluation  Past Medical History:  Diagnosis Date   AIDS (acquired immune deficiency syndrome) (HCC) 08/17/2016   Amaurosis fugax 08/18/2022   Chronic diastolic CHF  (congestive heart failure), NYHA class 3 (HCC) 01/2016   Chronic lower back pain    CKD (chronic kidney disease), stage III (HCC)    COPD (chronic obstructive pulmonary disease) (HCC)    Gout    "forearms, hands, ankles, feet" (06/05/2016)   Headache    "weekly" (06/05/2016)   Heart murmur    Hypertension    Hypertensive crisis 08/15/2017   Lipoma 07/06/2022   OSA on CPAP    PAD (peripheral artery disease) (HCC)  PAF (paroxysmal atrial fibrillation) (HCC) 01/2016   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    Final Clinical Impression(s) / ED Diagnoses Final diagnoses:  COPD exacerbation (HCC)  Hypertensive crisis  Acute respiratory failure with hypoxia (HCC)     This chart was dictated using voice recognition software.  Despite best efforts to proofread,  errors can occur which can change the documentation meaning.    Lonell Grandchild, MD 03/20/23 [phone]

## 2023-03-18 NOTE — ED Provider Notes (Signed)
Care of patient assumed from Dr. Suezanne Jacquet.  This patient presented with shortness of breath with history consistent with COPD exacerbation.  He is currently undergoing continuous nebulized breathing treatment.  He will require reassessment.  Blood pressure has been elevated which patient attributes to medication nonadherence.  Home blood pressure medications ordered. Physical Exam  BP (!) 214/113   Pulse 72   Temp 97.9 F (36.6 C) (Oral)   Resp 15   Ht 6' (1.829 m)   Wt 71.1 kg   SpO2 100%   BMI 21.26 kg/m   Physical Exam Vitals and nursing note reviewed.  Constitutional:      General: He is not in acute distress.    Appearance: He is well-developed. He is ill-appearing. He is not toxic-appearing or diaphoretic.  HENT:     Head: Normocephalic and atraumatic.     Mouth/Throat:     Mouth: Mucous membranes are moist.  Eyes:     Extraocular Movements: Extraocular movements intact.     Conjunctiva/sclera: Conjunctivae normal.  Cardiovascular:     Rate and Rhythm: Normal rate and regular rhythm.     Heart sounds: No murmur heard. Pulmonary:     Effort: Tachypnea and accessory muscle usage present.     Breath sounds: Decreased air movement present. Decreased breath sounds and wheezing present.  Chest:     Chest wall: No tenderness.  Abdominal:     Palpations: Abdomen is soft.     Tenderness: There is no abdominal tenderness.  Musculoskeletal:        General: No swelling. Normal range of motion.     Cervical back: Normal range of motion and neck supple.  Skin:    General: Skin is warm and dry.     Coloration: Skin is not cyanotic or pale.  Neurological:     General: No focal deficit present.     Mental Status: He is alert and oriented to person, place, and time.  Psychiatric:        Mood and Affect: Mood normal.        Behavior: Behavior normal.     Procedures  Procedures  ED Course / MDM   Clinical Course as of 03/18/23 2346  Sun Mar 18, 2023  2326 Signed out to Dr.  Durwin Nora pending re-assessment  [WS]    Clinical Course User Index [WS] Lonell Grandchild, MD   Medical Decision Making Amount and/or Complexity of Data Reviewed Labs: ordered.  Risk Prescription drug management.   After patient received labetalol, he had worsened shortness of breath.  Blood pressure remains elevated in the range of 220 SBP.  Patient was started on BiPAP and NTG gtt.  On reassessment, he is tolerating BiPAP well.  Blood pressure is now in the 170s SBP.  Patient to be admitted for further management.  CRITICAL CARE Performed by: Gloris Manchester   Total critical care time: 32 minutes  Critical care time was exclusive of separately billable procedures and treating other patients.  Critical care was necessary to treat or prevent imminent or life-threatening deterioration.  Critical care was time spent personally by me on the following activities: development of treatment plan with patient and/or surrogate as well as nursing, discussions with consultants, evaluation of patient's response to treatment, examination of patient, obtaining history from patient or surrogate, ordering and performing treatments and interventions, ordering and review of laboratory studies, ordering and review of radiographic studies, pulse oximetry and re-evaluation of patient's condition.  Gloris Manchester, MD 03/19/23 [phone]

## 2023-03-18 NOTE — ED Notes (Signed)
EDP Scheving MD made aware about patient's elevated blood pressure.

## 2023-03-18 NOTE — ED Triage Notes (Signed)
The pt is c/o sob today and he has shingles in his groin and upper lt leg  he is also c/o chest tightness

## 2023-03-18 NOTE — ED Notes (Signed)
Patient stated after receiving the dose of labetalol that he feels like he was "having some sort of reaction". Stated that his chest felt tight and he work of breathing increased. Gloris Manchester MD assessed patient and verbally ordered for patient to be placed on BiPAP. RT called and made aware.

## 2023-03-19 ENCOUNTER — Other Ambulatory Visit (HOSPITAL_COMMUNITY): Payer: Self-pay

## 2023-03-19 ENCOUNTER — Other Ambulatory Visit: Payer: Self-pay

## 2023-03-19 DIAGNOSIS — I160 Hypertensive urgency: Secondary | ICD-10-CM | POA: Diagnosis present

## 2023-03-19 DIAGNOSIS — I480 Paroxysmal atrial fibrillation: Secondary | ICD-10-CM | POA: Diagnosis present

## 2023-03-19 DIAGNOSIS — Z79899 Other long term (current) drug therapy: Secondary | ICD-10-CM | POA: Diagnosis not present

## 2023-03-19 DIAGNOSIS — M549 Dorsalgia, unspecified: Secondary | ICD-10-CM | POA: Diagnosis present

## 2023-03-19 DIAGNOSIS — J441 Chronic obstructive pulmonary disease with (acute) exacerbation: Secondary | ICD-10-CM | POA: Diagnosis present

## 2023-03-19 DIAGNOSIS — G4733 Obstructive sleep apnea (adult) (pediatric): Secondary | ICD-10-CM | POA: Diagnosis present

## 2023-03-19 DIAGNOSIS — I2489 Other forms of acute ischemic heart disease: Secondary | ICD-10-CM | POA: Diagnosis present

## 2023-03-19 DIAGNOSIS — Z8249 Family history of ischemic heart disease and other diseases of the circulatory system: Secondary | ICD-10-CM | POA: Diagnosis not present

## 2023-03-19 DIAGNOSIS — D631 Anemia in chronic kidney disease: Secondary | ICD-10-CM | POA: Diagnosis present

## 2023-03-19 DIAGNOSIS — I169 Hypertensive crisis, unspecified: Secondary | ICD-10-CM | POA: Diagnosis present

## 2023-03-19 DIAGNOSIS — I5032 Chronic diastolic (congestive) heart failure: Secondary | ICD-10-CM | POA: Diagnosis present

## 2023-03-19 DIAGNOSIS — F17210 Nicotine dependence, cigarettes, uncomplicated: Secondary | ICD-10-CM | POA: Diagnosis present

## 2023-03-19 DIAGNOSIS — E785 Hyperlipidemia, unspecified: Secondary | ICD-10-CM | POA: Diagnosis present

## 2023-03-19 DIAGNOSIS — I2510 Atherosclerotic heart disease of native coronary artery without angina pectoris: Secondary | ICD-10-CM | POA: Diagnosis present

## 2023-03-19 DIAGNOSIS — Z85528 Personal history of other malignant neoplasm of kidney: Secondary | ICD-10-CM | POA: Diagnosis not present

## 2023-03-19 DIAGNOSIS — Z7951 Long term (current) use of inhaled steroids: Secondary | ICD-10-CM | POA: Diagnosis not present

## 2023-03-19 DIAGNOSIS — M109 Gout, unspecified: Secondary | ICD-10-CM | POA: Diagnosis present

## 2023-03-19 DIAGNOSIS — Z1152 Encounter for screening for COVID-19: Secondary | ICD-10-CM | POA: Diagnosis not present

## 2023-03-19 DIAGNOSIS — I739 Peripheral vascular disease, unspecified: Secondary | ICD-10-CM | POA: Diagnosis present

## 2023-03-19 DIAGNOSIS — G8929 Other chronic pain: Secondary | ICD-10-CM | POA: Diagnosis present

## 2023-03-19 DIAGNOSIS — B0229 Other postherpetic nervous system involvement: Secondary | ICD-10-CM | POA: Diagnosis present

## 2023-03-19 DIAGNOSIS — B20 Human immunodeficiency virus [HIV] disease: Secondary | ICD-10-CM | POA: Diagnosis present

## 2023-03-19 DIAGNOSIS — N1832 Chronic kidney disease, stage 3b: Secondary | ICD-10-CM | POA: Diagnosis present

## 2023-03-19 DIAGNOSIS — Z7982 Long term (current) use of aspirin: Secondary | ICD-10-CM | POA: Diagnosis not present

## 2023-03-19 DIAGNOSIS — I130 Hypertensive heart and chronic kidney disease with heart failure and stage 1 through stage 4 chronic kidney disease, or unspecified chronic kidney disease: Secondary | ICD-10-CM | POA: Diagnosis present

## 2023-03-19 LAB — COMPREHENSIVE METABOLIC PANEL
ALT: 12 U/L (ref 0–44)
AST: 18 U/L (ref 15–41)
Albumin: 3.3 g/dL — ABNORMAL LOW (ref 3.5–5.0)
Alkaline Phosphatase: 62 U/L (ref 38–126)
Anion gap: 6 (ref 5–15)
BUN: 15 mg/dL (ref 8–23)
CO2: 19 mmol/L — ABNORMAL LOW (ref 22–32)
Calcium: 8.5 mg/dL — ABNORMAL LOW (ref 8.9–10.3)
Chloride: 114 mmol/L — ABNORMAL HIGH (ref 98–111)
Creatinine, Ser: 1.68 mg/dL — ABNORMAL HIGH (ref 0.61–1.24)
GFR, Estimated: 45 mL/min — ABNORMAL LOW (ref >60)
Glucose, Bld: 184 mg/dL — ABNORMAL HIGH (ref 70–99)
Potassium: 4.5 mmol/L (ref 3.5–5.1)
Sodium: 139 mmol/L (ref 135–145)
Total Bilirubin: 0.6 mg/dL (ref 0.0–1.2)
Total Protein: 6.4 g/dL — ABNORMAL LOW (ref 6.5–8.1)

## 2023-03-19 LAB — BRAIN NATRIURETIC PEPTIDE: B Natriuretic Peptide: 39.9 pg/mL (ref 0.0–100.0)

## 2023-03-19 MED ORDER — ENOXAPARIN SODIUM 40 MG/0.4ML IJ SOSY
40.0000 mg | PREFILLED_SYRINGE | INTRAMUSCULAR | Status: DC
  Administered 2023-03-19 – 2023-03-20 (×2): 40 mg via SUBCUTANEOUS
  Filled 2023-03-19 (×2): qty 0.4

## 2023-03-19 MED ORDER — AZITHROMYCIN 500 MG PO TABS
500.0000 mg | ORAL_TABLET | Freq: Every day | ORAL | Status: DC
  Administered 2023-03-20: 500 mg via ORAL
  Filled 2023-03-19: qty 1

## 2023-03-19 MED ORDER — AMLODIPINE BESYLATE 10 MG PO TABS
10.0000 mg | ORAL_TABLET | Freq: Every day | ORAL | Status: DC
Start: 2023-03-19 — End: 2023-03-20
  Administered 2023-03-19 – 2023-03-20 (×2): 10 mg via ORAL
  Filled 2023-03-19: qty 2
  Filled 2023-03-19: qty 1

## 2023-03-19 MED ORDER — ISOSORBIDE MONONITRATE ER 60 MG PO TB24
60.0000 mg | ORAL_TABLET | Freq: Every day | ORAL | Status: DC
Start: 2023-03-19 — End: 2023-03-20
  Administered 2023-03-19 – 2023-03-20 (×2): 60 mg via ORAL
  Filled 2023-03-19: qty 1
  Filled 2023-03-19: qty 2

## 2023-03-19 MED ORDER — IRBESARTAN 150 MG PO TABS
150.0000 mg | ORAL_TABLET | Freq: Every day | ORAL | Status: DC
  Administered 2023-03-19 – 2023-03-20 (×2): 150 mg via ORAL
  Filled 2023-03-19 (×2): qty 1

## 2023-03-19 MED ORDER — HYDROCODONE-ACETAMINOPHEN 5-325 MG PO TABS
1.0000 | ORAL_TABLET | Freq: Four times a day (QID) | ORAL | Status: AC | PRN
  Administered 2023-03-19: 1 via ORAL
  Filled 2023-03-19: qty 1

## 2023-03-19 MED ORDER — ACETAMINOPHEN 650 MG RE SUPP: 650.0000 mg | Freq: Four times a day (QID) | RECTAL | Status: DC | PRN

## 2023-03-19 MED ORDER — FLUTICASONE FUROATE-VILANTEROL 200-25 MCG/ACT IN AEPB
1.0000 | INHALATION_SPRAY | Freq: Every day | RESPIRATORY_TRACT | Status: DC
  Administered 2023-03-20: 1 via RESPIRATORY_TRACT
  Filled 2023-03-19: qty 28

## 2023-03-19 MED ORDER — ACETAMINOPHEN 325 MG PO TABS
650.0000 mg | ORAL_TABLET | Freq: Four times a day (QID) | ORAL | Status: DC | PRN
  Filled 2023-03-19: qty 2

## 2023-03-19 MED ORDER — LORAZEPAM 2 MG/ML IJ SOLN
1.0000 mg | Freq: Once | INTRAMUSCULAR | Status: AC
Start: 2023-03-19 — End: 2023-03-19
  Administered 2023-03-19: 1 mg via INTRAVENOUS
  Filled 2023-03-19: qty 1

## 2023-03-19 MED ORDER — IPRATROPIUM-ALBUTEROL 0.5-2.5 (3) MG/3ML IN SOLN: 3.0000 mL | Freq: Four times a day (QID) | RESPIRATORY_TRACT | Status: DC | PRN

## 2023-03-19 MED ORDER — MONTELUKAST SODIUM 10 MG PO TABS
10.0000 mg | ORAL_TABLET | Freq: Every day | ORAL | Status: DC
Start: 2023-03-19 — End: 2023-03-20
  Administered 2023-03-19: 10 mg via ORAL
  Filled 2023-03-19: qty 1

## 2023-03-19 MED ORDER — PREDNISONE 20 MG PO TABS
40.0000 mg | ORAL_TABLET | Freq: Every day | ORAL | Status: DC
  Administered 2023-03-19 – 2023-03-20 (×2): 40 mg via ORAL
  Filled 2023-03-19 (×2): qty 2

## 2023-03-19 MED ORDER — SODIUM CHLORIDE 0.9 % IV SOLN
500.0000 mg | INTRAVENOUS | Status: AC
  Administered 2023-03-19: 500 mg via INTRAVENOUS
  Filled 2023-03-19: qty 5

## 2023-03-19 MED ORDER — UMECLIDINIUM BROMIDE 62.5 MCG/ACT IN AEPB
1.0000 | INHALATION_SPRAY | Freq: Every day | RESPIRATORY_TRACT | Status: DC
  Administered 2023-03-20: 1 via RESPIRATORY_TRACT
  Filled 2023-03-19: qty 7

## 2023-03-19 NOTE — ED Notes (Signed)
BP 160/69 (116) and patient denies chest pain, per order no changes made to titration.

## 2023-03-19 NOTE — Progress Notes (Signed)
Progress Note   Patient: Kevin [name]:[id number] DOB: [birth year] DOA: 03/18/2023     0 DOS: the patient was seen and examined on 03/19/2023   Brief hospital course: MAKEL MCMANN is a 68 y.o. male with medical history significant of HIV/AIDS, paroxysmal A-fib, COPD, tobacco abuse, chronic HFpEF, CAD, CKD stage IIIb, hyperlipidemia, hypertension, pulmonary nodules, herpes zoster rash of the upper left leg treated with valacyclovir back in November 2024 with persistent pain/postherpetic neuralgia, PAD, OSA on CPAP, chronic back pain, gout, papillary renal cell carcinoma presented to the ED complaints of shortness of breath, wheezing, and chest tightness.  Hypertensive with systolic in the 200s.  Labs showing no leukocytosis, hemoglobin 11.5 (stable), creatinine 1.7 (stable), troponin 38> 37, VBG with pH 7.32 and pCO2 45.7, COVID/influenza/RSV PCR negative, BNP normal.  Chest x-ray showing no active cardiopulmonary disease. Patient was also given labetalol for hypertension after which he had worsening shortness of breath and blood pressure remained elevated.  He was placed on BiPAP and started on nitroglycerin drip.  Patient also received albuterol, hydralazine, DuoNeb, Ativan, Solu-Medrol 125 mg, morphine, IV magnesium 2 g, and 1 L IV fluids in the ED.  TRH called to admit.   Patient admitted COPD exacerbation has improved weaned off oxygen.  Blood pressure also improved initiated on oral antihypertensive medications and weaned off nitro drip.  Assessment and Plan:  Acute COPD exacerbation No evidence of hypercapnia on blood gas.  COVID/influenza/RSV PCR negative.  Patient briefly required BiPAP in the ED due to increased work of breathing after he received labetalol.  Now weaned down to 2 L nasal cannula and resting comfortably.  No respiratory distress.  No longer wheezing on exam.  Start prednisone 40 mg daily x 5 days, azithromycin x 5 days, continue Trelegy, Singulair, and DuoNeb as  needed. COPD Exacerbation improved.   Hypertensive urgency SBP in the 200s and remained elevated despite hydralazine and labetalol.   -Started on nitro drip. -Initiated amlodipine, irbesartan and Imdur  -Weaning nitro drip.    Chronic HFpEF Last echo done in November 2023 showing EF 60 to 65%, grade 1 diastolic dysfunction.  No signs of volume overload at this time.   Mild troponin elevation Likely due to demand ischemia, pattern not consistent with ACS.  Patient is no longer having active chest pain or tightness.   CKD stage IIIb Creatinine stable, continue to monitor renal function.   OSA Continue nightly CPAP.   Chronic anemia Hemoglobin stable.   HIV/AIDS Paroxysmal A-fib: Currently in sinus rhythm. CAD: Workup not suggestive of ACS. Hyperlipidemia Postherpetic neuralgia Chronic back pain Gout PAD Pharmacy med rec pending.   DVT prophylaxis: Lovenox Code Status: Full Code (discussed with the patient)     Subjective: Patient was seen and examined bedside today.  Patient reports improvement in shortness of breath.  Denies having any chest pain palpitation.  Physical Exam: Vitals:   03/19/23 1230 03/19/23 1245 03/19/23 1337 03/19/23 1354  BP: (!) 158/68 (!) 166/88  (!) 163/85  Pulse: 74 73    Resp: 14 14  14   Temp:   98 F (36.7 C)   TempSrc:   Oral   SpO2: 98% 98%  99%  Weight:      Height:       Constitutional:      General: He is not in acute distress. HENT:     Head: Normocephalic and atraumatic.  Eyes:     Extraocular Movements: Extraocular movements intact.  Cardiovascular:  Rate and Rhythm: Normal rate and regular rhythm.     Pulses: Normal pulses.  Pulmonary:     Effort: Pulmonary effort is normal. No respiratory distress.     Breath sounds: No wheezing, rhonchi or rales.  Abdominal:     General: Bowel sounds are normal. There is no distension.     Palpations: Abdomen is soft.     Tenderness: There is no abdominal tenderness. There is no  guarding.  Musculoskeletal:     Cervical back: Normal range of motion.     Right lower leg: No edema.     Left lower leg: No edema.  Skin:    General: Skin is warm and dry.     Comments: Left thigh with hyperpigmented spots in the area of prior shingles.  No active rash, vesicles, or erythema seen.  Neurological:     General: No focal deficit present.     Mental Status: He is alert and oriented to person, place, and time.  Data Reviewed:  There are no new results to review at this time.  Family Communication: Patient is alert and oriented  Disposition: Status is: Inpatient Remains inpatient appropriate because: Hypertensive urgency  Planned Discharge Destination: Home    Time spent: 35 minutes  Author: Harold Hedge, MD 03/19/2023 2:25 PM  For on call review [email or web address].

## 2023-03-19 NOTE — H&P (Signed)
History and Physical    KRISTINA BERTONE ZOX:[id number] DOB: Apr 11, 1955 DOA: 03/18/2023  PCP: [name], MD  Patient coming from: Home  Chief Complaint: Shortness of breath  HPI: Kevin [name] is a 68 y.o. male with medical history significant of HIV/AIDS, paroxysmal A-fib, COPD, tobacco abuse, chronic HFpEF, CAD, CKD stage IIIb, hyperlipidemia, hypertension, pulmonary nodules, herpes zoster rash of the upper left leg treated with valacyclovir back in November 2024 with persistent pain/postherpetic neuralgia, PAD, OSA on CPAP, chronic back pain, gout, papillary renal cell carcinoma presented to the ED complaints of shortness of breath, wheezing, and chest tightness.  Hypertensive with systolic in the 200s.  Labs showing no leukocytosis, hemoglobin 11.5 (stable), creatinine 1.7 (stable), troponin 38> 37, VBG with pH 7.32 and pCO2 45.7, COVID/influenza/RSV PCR negative, BNP normal.  Chest x-ray showing no active cardiopulmonary disease. Patient was also given labetalol for hypertension after which he had worsening shortness of breath and blood pressure remained elevated.  He was placed on BiPAP and started on nitroglycerin drip.  Patient also received albuterol, hydralazine, DuoNeb, Ativan, Solu-Medrol 125 mg, morphine, IV magnesium 2 g, and 1 L IV fluids in the ED.  TRH called to admit.  Patient is a poor historian.  States since yesterday morning he started having shortness of breath, wheezing, and cough.  Also had some chest tightness which has now resolved.  He reports using Trelegy daily and uses albuterol as needed.  Not on home oxygen.  Also reports compliance with his home blood pressure medications.  States his PCP recently started him on a new blood pressure medication.  He has been checking his blood pressure at home and systolic has been in 150s.  He has no other complaints.  He reports history of shingles rash on his left upper thigh which was previously treated a few months ago and no  longer having a rash but continues to have pain.  Review of Systems:  Review of Systems  All other systems reviewed and are negative.   Past Medical History:  Diagnosis Date   AIDS (acquired immune deficiency syndrome) (HCC) 08/17/2016   Amaurosis fugax 08/18/2022   Chronic diastolic CHF (congestive heart failure), NYHA class 3 (HCC) 01/2016   Chronic lower back pain    CKD (chronic kidney disease), stage III (HCC)    COPD (chronic obstructive pulmonary disease) (HCC)    Gout    "forearms, hands, ankles, feet" (06/05/2016)   Headache    "weekly" (06/05/2016)   Heart murmur    Hypertension    Hypertensive crisis 08/15/2017   Lipoma 07/06/2022   OSA on CPAP    PAD (peripheral artery disease) (HCC)    PAF (paroxysmal atrial fibrillation) (HCC) 01/2016   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    Past Surgical History:  Procedure Laterality Date   IR RADIOLOGIST EVAL & MGMT  05/31/2021   IR RADIOLOGIST EVAL & MGMT  07/26/2021   LEFT HEART CATH AND CORONARY ANGIOGRAPHY N/A 10/23/2016   Procedure: LEFT HEART CATH AND CORONARY ANGIOGRAPHY;  Surgeon: Marykay Lex, MD;  Location: [facility] INVASIVE CV LAB;  Service: Cardiovascular;  Laterality: N/A;   LOWER EXTREMITY ANGIOGRAPHY N/A 07/17/2016   Procedure: Lower Extremity Angiography;  Surgeon: Runell Gess, MD;  Location: [facility] INVASIVE CV LAB;  Service: Cardiovascular;  Laterality: N/A;   LOWER EXTREMITY INTERVENTION N/A 06/05/2016   Procedure: Lower Extremity Intervention;  Surgeon: Runell Gess, MD;  Location: [facility] INVASIVE CV LAB;  Service: Cardiovascular;  Laterality:  N/A;   PERIPHERAL VASCULAR ATHERECTOMY Right 07/17/2016   Procedure: Peripheral Vascular Atherectomy;  Surgeon: Runell Gess, MD;  Location: [facility] INVASIVE CV LAB;  Service: Cardiovascular;  Laterality: Right;  SFA   PERIPHERAL VASCULAR INTERVENTION  06/05/2016   Procedure: Peripheral Vascular Intervention;  Surgeon: Runell Gess, MD;  Location: [facility] INVASIVE CV  LAB;  Service: Cardiovascular;;  left SFA   RADIOLOGY WITH ANESTHESIA Right 06/29/2021   Procedure: RIGHT RENAL CRYOABLATION;  Surgeon: Richarda Overlie, MD;  Location: [facility] ORS;  Service: Anesthesiology;  Laterality: Right;     reports that he has been smoking cigarettes and e-cigarettes. He has a 4.2 pack-year smoking history. He has never been exposed to tobacco smoke. He has never used smokeless tobacco. He reports that he does not currently use alcohol after a past usage of about 2.0 standard drinks of alcohol per week. He reports that he does not currently use drugs.  No Known Allergies  Family History  Problem Relation Age of Onset   High blood pressure Mother    Lupus Mother    Seizures Daughter    Heart attack Daughter     Prior to Admission medications   Medication Sig Start Date End Date Taking? Authorizing Provider  amLODipine-valsartan (EXFORGE) 10-320 MG tablet Take 1 tablet by mouth daily. 11/17/22  Yes Marjie Skiff E, PA-C  atorvastatin (LIPITOR) 80 MG tablet Take 1 tablet (80 mg total) by mouth daily. 02/08/23  Yes Alver Sorrow, NP  busPIRone (BUSPAR) 7.5 MG tablet Take 1 tablet (7.5 mg total) by mouth 2 (two) times daily. 03/12/23 06/10/23 Yes [name], MD  gabapentin (NEURONTIN) 300 MG capsule Take 1 capsule (300 mg total) by mouth 3 (three) times daily as needed (shingles pain). 03/18/23  Yes Lonell Grandchild, MD  hydrALAZINE (APRESOLINE) 100 MG tablet Take 1 tablet (100 mg total) by mouth 3 (three) times daily. 02/08/23  Yes Alver Sorrow, NP  isosorbide mononitrate (IMDUR) 60 MG 24 hr tablet Take 1 tablet (60 mg total) by mouth daily. 02/08/23 05/09/23 Yes Alver Sorrow, NP  montelukast (SINGULAIR) 10 MG tablet Take 1 tablet (10 mg total) by mouth at bedtime. 02/15/23  Yes Cobb, Ruby Cola, NP  nebivolol (BYSTOLIC) 5 MG tablet Take 1 tablet (5 mg total) by mouth daily. 02/08/23  Yes Alver Sorrow, NP  albuterol (VENTOLIN HFA) 108 (90 Base) MCG/ACT  inhaler Inhale 1-2 puffs into the lungs every 6 (six) hours as needed for wheezing or shortness of breath. 03/14/23   Bevelyn Ngo, NP  aspirin EC 81 MG tablet Take 1 tablet (81 mg total) by mouth daily, swallow whole 04/28/22   [name], MD  dolutegravir-lamiVUDine (DOVATO) 50-300 MG tablet Take 1 tablet by mouth daily. 07/06/22   Randall Hiss, MD  Fluticasone-Umeclidin-Vilant (TRELEGY ELLIPTA) 200-62.5-25 MCG/ACT AEPB Inhale 1 puff into the lungs daily. 09/08/22     HYDROcodone-acetaminophen (NORCO) 7.5-325 MG tablet Take 1 tablet by mouth every 6 (six) hours as needed for moderate pain (pain score 4-6). 02/05/23   Salvatore Decent, FNP  ipratropium-albuterol (DUONEB) 0.5-2.5 (3) MG/3ML SOLN Take 3 mLs by nebulization every 6 (six) hours as needed. 02/27/23   Waymon Budge, MD  naloxone [facility]) nasal spray 4 mg/0.1 mL Call 911. Spray contents of 1 spray into one nostril. Repeat in 2-3 min if symptoms of opioid emergency persists; alternate nostrils 10/20/22     triamcinolone cream (KENALOG) 0.1 % Apply 1 Application topically 2 (two)  times daily. Apply a small amount twice daily to the affected areas for 10 days and then discontinue 01/13/23   White, Lanora Manis A, PA-C  Vitamin D, Ergocalciferol, (DRISDOL) 1.25 MG (50000 UNIT) CAPS capsule Take 1 capsule (50,000 Units total) by mouth once a week for 12 weeks, then recheck level in the office. 03/01/23       Physical Exam: Vitals:   03/19/23 0009 03/19/23 0011 03/19/23 0015 03/19/23 0115  BP: (!) 178/104 (!) 160/96 (!) 149/90 (!) 170/91  Pulse: 69 73 84 90  Resp: 15 20 (!) 27 17  Temp:      TempSrc:      SpO2: 100% 99% 100% 100%  Weight:      Height:        Physical Exam Vitals reviewed.  Constitutional:      General: He is not in acute distress. HENT:     Head: Normocephalic and atraumatic.  Eyes:     Extraocular Movements: Extraocular movements intact.  Cardiovascular:     Rate and Rhythm: Normal rate and regular  rhythm.     Pulses: Normal pulses.  Pulmonary:     Effort: Pulmonary effort is normal. No respiratory distress.     Breath sounds: No wheezing, rhonchi or rales.  Abdominal:     General: Bowel sounds are normal. There is no distension.     Palpations: Abdomen is soft.     Tenderness: There is no abdominal tenderness. There is no guarding.  Musculoskeletal:     Cervical back: Normal range of motion.     Right lower leg: No edema.     Left lower leg: No edema.  Skin:    General: Skin is warm and dry.     Comments: Left thigh with hyperpigmented spots in the area of prior shingles.  No active rash, vesicles, or erythema seen.  Neurological:     General: No focal deficit present.     Mental Status: He is alert and oriented to person, place, and time.    Labs on Admission: I have personally reviewed following labs and imaging studies  CBC: Recent Labs  Lab 03/18/23 1947 03/18/23 2134  WBC 8.7  --   HGB 11.5* 12.2*  HCT 34.2* 36.0*  MCV 94.2  --   PLT 300  --    Basic Metabolic Panel: Recent Labs  Lab 03/18/23 1947 03/18/23 2134  NA 141 144  K 4.1 4.7  CL 112*  --   CO2 22  --   GLUCOSE 93  --   BUN 13  --   CREATININE 1.75*  --   CALCIUM 8.7*  --    GFR: Estimated Creatinine Clearance: 42.3 mL/min (A) (by C-G formula based on SCr of 1.75 mg/dL (H)). Liver Function Tests: No results for input(s): "AST", "ALT", "ALKPHOS", "BILITOT", "PROT", "ALBUMIN" in the last 168 hours. No results for input(s): "LIPASE", "AMYLASE" in the last 168 hours. No results for input(s): "AMMONIA" in the last 168 hours. Coagulation Profile: No results for input(s): "INR", "PROTIME" in the last 168 hours. Cardiac Enzymes: No results for input(s): "CKTOTAL", "CKMB", "CKMBINDEX", "TROPONINI" in the last 168 hours. BNP (last 3 results) No results for input(s): "PROBNP" in the last 8760 hours. HbA1C: No results for input(s): "HGBA1C" in the last 72 hours. CBG: No results for input(s):  "GLUCAP" in the last 168 hours. Lipid Profile: No results for input(s): "CHOL", "HDL", "LDLCALC", "TRIG", "CHOLHDL", "LDLDIRECT" in the last 72 hours. Thyroid Function Tests: No results for  input(s): "TSH", "T4TOTAL", "FREET4", "T3FREE", "THYROIDAB" in the last 72 hours. Anemia Panel: No results for input(s): "VITAMINB12", "FOLATE", "FERRITIN", "TIBC", "IRON", "RETICCTPCT" in the last 72 hours. Urine analysis:    Component Value Date/Time   COLORURINE YELLOW 04/28/2022 1532   APPEARANCEUR CLEAR 04/28/2022 1532   LABSPEC 1.014 04/28/2022 1532   PHURINE 5.5 04/28/2022 1532   GLUCOSEU NEGATIVE 04/28/2022 1532   HGBUR NEGATIVE 04/28/2022 1532   BILIRUBINUR NEGATIVE 12/27/2021 0722   KETONESUR NEGATIVE 04/28/2022 1532   PROTEINUR 1+ (A) 04/28/2022 1532   UROBILINOGEN 0.2 01/17/2013 0022   NITRITE NEGATIVE 04/28/2022 1532   LEUKOCYTESUR NEGATIVE 04/28/2022 1532    Radiological Exams on Admission: DG Chest 2 View Result Date: 03/18/2023 CLINICAL DATA:  Shortness of breath all day.  Shingles for a month. EXAM: CHEST - 2 VIEW COMPARISON:  01/04/2023 and CT chest 03/07/2023 FINDINGS: The heart size and mediastinal contours are within normal limits. Both lungs are clear. The visualized skeletal structures are unremarkable. IMPRESSION: No active cardiopulmonary disease. Electronically Signed   By: Minerva Fester M.D.   On: 03/18/2023 20:11    EKG: Independently reviewed.  Sinus rhythm, LVH, T wave inversions inferolaterally seen on previous tracing as well, baseline wander/ ?ST elevations in V3 and V4.  Assessment and Plan  Acute COPD exacerbation No evidence of hypercapnia on blood gas.  COVID/influenza/RSV PCR negative.  Patient briefly required BiPAP in the ED due to increased work of breathing after he received labetalol.  Now weaned down to 2 L nasal cannula and resting comfortably.  No respiratory distress.  No longer wheezing on exam.  Start prednisone 40 mg daily x 5 days, azithromycin  x 5 days, continue Trelegy, Singulair, and DuoNeb as needed.  Hypertensive urgency SBP in the 200s and remained elevated despite hydralazine and labetalol.  Home medication regimen unknown at this time, pharmacy med rec pending.  Continue nitro drip for now.  Chronic HFpEF Last echo done in November 2023 showing EF 60 to 65%, grade 1 diastolic dysfunction.  No signs of volume overload at this time.  Mild troponin elevation Likely due to demand ischemia, pattern not consistent with ACS.  Patient is no longer having active chest pain or tightness.  CKD stage IIIb Creatinine stable, continue to monitor renal function.  OSA Continue nightly CPAP.  Chronic anemia Hemoglobin stable.  HIV/AIDS Paroxysmal A-fib: Currently in sinus rhythm. CAD: Workup not suggestive of ACS. Hyperlipidemia Postherpetic neuralgia Chronic back pain Gout PAD Pharmacy med rec pending.  DVT prophylaxis: Lovenox Code Status: Full Code (discussed with the patient) Family Communication: No family available at this time. Level of care: Progressive Care Unit Admission status: It is my clinical opinion that admission to INPATIENT is reasonable and necessary because of the expectation that this patient will require hospital care that crosses at least 2 midnights to treat this condition based on the medical complexity of the problems presented.  Given the aforementioned information, the predictability of an adverse outcome is felt to be significant.  John Giovanni MD [facility]  If 7PM-7AM, please contact night-coverage [email or web address]  03/19/2023, 2:02 AM

## 2023-03-19 NOTE — Plan of Care (Signed)

## 2023-03-19 NOTE — ED Notes (Addendum)
Patient gave verbal premission to talk to Quintella Baton about his care.

## 2023-03-19 NOTE — ED Notes (Addendum)
Patient did not want to be connected to monitoring, obtained patients vitals only and then disconnected patient per his request.

## 2023-03-20 ENCOUNTER — Other Ambulatory Visit (HOSPITAL_COMMUNITY): Payer: Self-pay

## 2023-03-20 ENCOUNTER — Telehealth: Payer: Self-pay | Admitting: Family Medicine

## 2023-03-20 ENCOUNTER — Telehealth: Payer: Self-pay

## 2023-03-20 DIAGNOSIS — J441 Chronic obstructive pulmonary disease with (acute) exacerbation: Secondary | ICD-10-CM | POA: Diagnosis not present

## 2023-03-20 MED ORDER — PREDNISONE 10 MG PO TABS
ORAL_TABLET | ORAL | 0 refills | Status: AC
  Filled 2023-03-20: qty 30, 12d supply, fill #0

## 2023-03-20 MED ORDER — AZITHROMYCIN 500 MG PO TABS
500.0000 mg | ORAL_TABLET | Freq: Every day | ORAL | 0 refills | Status: AC
  Filled 2023-03-20: qty 3, 3d supply, fill #0

## 2023-03-20 MED ORDER — GABAPENTIN 100 MG PO CAPS
100.0000 mg | ORAL_CAPSULE | Freq: Three times a day (TID) | ORAL | Status: DC
  Administered 2023-03-20: 100 mg via ORAL
  Filled 2023-03-20: qty 1

## 2023-03-20 MED ORDER — MORPHINE SULFATE (PF) 2 MG/ML IV SOLN
1.0000 mg | INTRAVENOUS | Status: DC | PRN
  Administered 2023-03-20: 1 mg via INTRAVENOUS
  Filled 2023-03-20: qty 1

## 2023-03-20 NOTE — Plan of Care (Signed)
   Problem: Activity: Goal: Risk for activity intolerance will decrease Outcome: Progressing   Problem: Coping: Goal: Level of anxiety will decrease Outcome: Progressing   Problem: Safety: Goal: Ability to remain free from injury will improve Outcome: Progressing

## 2023-03-20 NOTE — Transitions of Care (Post Inpatient/ED Visit) (Signed)
 03/20/2023  Name: Kevin [name] MRN: [id number] DOB: 06-Apr-1955  Today's TOC FU Call Status: Today's TOC FU Call Status:: Successful TOC FU Call Completed TOC FU Call Complete Date: 03/20/23 Patient's Name and Date of Birth confirmed.  Transition Care Management Follow-up Telephone Call Date of Discharge: 03/18/23 Discharge Facility: [name]) Type of Discharge: Emergency Department Reason for ED Visit: Respiratory (shingles exposure) Respiratory Diagnosis: COPD Exacerbation How have you been since you were released from the hospital?: Same Any questions or concerns?: Yes Patient Questions/Concerns:: patient is having pain from shingles exposure Patient Questions/Concerns Addressed: Provided Patient Educational Materials  Items Reviewed: Did you receive and understand the discharge instructions provided?: No Medications obtained,verified, and reconciled?: Yes (Medications Reviewed) Any new allergies since your discharge?: No Dietary orders reviewed?: No Do you have support at home?: No  Medications Reviewed Today: Medications Reviewed Today     Reviewed by Eugenie Ulanda CROME, CMA (Certified Medical Assistant) on 03/20/23 at 1342  Med List Status: <None>   Medication Order Taking? Sig Documenting Provider Last Dose Status Informant  albuterol  (VENTOLIN  HFA) 108 (90 Base) MCG/ACT inhaler [id number] Yes Inhale 1-2 puffs into the lungs every 6 (six) hours as needed for wheezing or shortness of breath. Ruthell Lauraine FALCON, NP Taking Active Pharmacy Records, Self  amLODipine -valsartan  (EXFORGE ) 10-320 MG tablet [id number] Yes Take 1 tablet by mouth daily. Goodrich, Callie E, PA-C Taking Active Pharmacy Records, Self  aspirin  EC 81 MG tablet [id number] Yes Take 1 tablet (81 mg total) by mouth daily, swallow whole [name], MD Taking Active Pharmacy Records, Self           Med Note [name], [place] A   Tue Dec 05, 2022  3:43 PM)    atorvastatin  (LIPITOR) 80 MG tablet [id number] Yes  Take 1 tablet (80 mg total) by mouth daily. [name], NP Taking Active Pharmacy Records, Self  azithromycin  (ZITHROMAX ) 500 MG tablet [id number] Yes Take 1 tablet (500 mg total) by mouth daily for 3 days. Rosario Leatrice FERNS, MD Taking Active   busPIRone  (BUSPAR ) 7.5 MG tablet [id number] Yes Take 1 tablet (7.5 mg total) by mouth 2 (two) times daily. [name], MD Taking Active Pharmacy Records, Self  dolutegravir -lamiVUDine  (DOVATO ) 50-300 MG tablet [id number] Yes Take 1 tablet by mouth daily. Fleeta Rothman, [name], MD Taking Active Self, Pharmacy Records  Fluticasone -Umeclidin-Vilant (TRELEGY ELLIPTA ) 200-62.5-25 MCG/ACT AEPB [id number] Yes Inhale 1 puff into the lungs daily.  Taking Active Pharmacy Records, Self           Med Note [name], [name]   Tue Dec 05, 2022  3:42 PM)    gabapentin  (NEURONTIN ) 300 MG capsule [id number] Yes Take 1 capsule (300 mg total) by mouth 3 (three) times daily as needed (shingles pain). Francesca Elsie CROME, MD Taking Active   ipratropium-albuterol  (DUONEB) 0.5-2.5 (3) MG/3ML SOLN [id number] Yes Take 3 mLs by nebulization every 6 (six) hours as needed.  Patient taking differently: Take 3 mLs by nebulization every 6 (six) hours as needed (for wheezing and shortness of breath).   Neysa Reggy BIRCH, MD Taking Active Pharmacy Records, Self  isosorbide  mononitrate (IMDUR ) 60 MG 24 hr tablet [id number] Yes Take 1 tablet (60 mg total) by mouth daily. [name], NP Taking Active Pharmacy Records, Self           Med Note [facility], DONETA RAMAN Kitchens Mar 19, 2023 12:44 AM)    montelukast  (SINGULAIR ) 10 MG tablet [id number] Yes Take  1 tablet (10 mg total) by mouth at bedtime. Malachy Comer GAILS, NP Taking Active Pharmacy Records, Self  nebivolol  (BYSTOLIC ) 5 MG tablet [id number] Yes Take 1 tablet (5 mg total) by mouth daily. [name], NP Taking Active Pharmacy Records, Self  predniSONE  (DELTASONE ) 10 MG tablet [id number] Yes Take 4 tablets (40 mg total) by mouth  daily for 3 days, THEN 3 tablets (30 mg total) daily for 3 days, THEN 2 tablets (20 mg total) daily for 3 days, THEN 1 tablet (10 mg total) daily for 3 days THEN STOP Rosario Leatrice FERNS, MD Taking Active             Home Care and Equipment/Supplies: Were Home Health Services Ordered?: No Any new equipment or medical supplies ordered?: No  Functional Questionnaire: Do you need assistance with bathing/showering or dressing?: No Do you need assistance with meal preparation?: No Do you need assistance with eating?: No Do you have difficulty maintaining continence: No Do you need assistance with getting out of bed/getting out of a chair/moving?: No Do you have difficulty managing or taking your medications?: No  Follow up appointments reviewed: PCP Follow-up appointment confirmed?: Yes Date of PCP follow-up appointment?: 03/29/23 Follow-up Provider: Dr. Beverley Hummer MD Specialist Hospital Follow-up appointment confirmed?: No Reason Specialist Follow-Up Not Confirmed: Appointment Sceduled by [facility] Calling Clinician Do you need transportation to your follow-up appointment?: No Do you understand care options if your condition(s) worsen?: Yes-patient verbalized understanding    SIGNATURE Rayah Fines RMA

## 2023-03-20 NOTE — Telephone Encounter (Signed)
 Routing to provider for review/recommendations.  Copied from CRM [id number]. Topic: Clinical - Prescription Issue >> Mar 20, 2023 11:54 AM Corean SAUNDERS wrote:  Patient states he is being released from the hospital today and that the hospital staff advised him to call his primary care office to get his primary care doctor to prescribe him pain medication.  Please send medication to:  [id number] - [place], [place] - [address] AT [facility] OF [address] [place] [address] Phone: [phone] Fax: [phone]

## 2023-03-20 NOTE — Discharge Summary (Signed)
 Physician Discharge Summary  Patient ID: Kevin [name] MRN: [id number] DOB/AGE: 66-Sep-1957 66 y.o.  Admit date: 03/18/2023 Discharge date: 03/20/2023  Admission Diagnoses:  Discharge Diagnoses:  Principal Problem:   COPD with acute exacerbation (HCC) Active Problems:   PAF (paroxysmal atrial fibrillation) (HCC) - not started on anticoagulation given low CHA2DS2-VASc score and poor medical follow-up.   OSA (obstructive sleep apnea)   Peripheral arterial disease (HCC)   CKD stage 3b, GFR 30-44 ml/min (HCC) - baseline SCr 1.8-2.2   CAD (coronary artery disease)   Hypertensive urgency   Chronic heart failure with preserved ejection fraction (HFpEF) (HCC)   HLD (hyperlipidemia)   Elevated troponin   Discharged Condition: stable  Hospital Course: Patient is a 68 year old male, with past medical history significant for HIV/AIDS, paroxysmal A-fib, COPD, tobacco abuse, chronic HFpEF, CAD, CKD stage IIIb, hyperlipidemia, hypertension, pulmonary nodules, herpes zoster rash of the upper left leg treated with valacyclovir  back in November 2024 with persistent pain/postherpetic neuralgia, PAD, OSA on CPAP, chronic back pain, gout, and papillary renal cell carcinoma.  Patient was admitted with COPD exacerbation.  On presentation to the hospital, patient was hypertensive with systolic blood pressure in the 200s.  Labs revealed no leukocytosis, hemoglobin 11.5 (stable), creatinine 1.7 (stable), troponin 38> 37, VBG with pH 7.32 and pCO2 45.7, COVID/influenza/RSV PCR negative, BNP normal.  Chest x-ray revealed no active cardiopulmonary disease. Patient was initially placed on BiPAP, and started on nitroglycerin  drip.  Patient was managed for COPD exacerbation.  Patient has improved significantly.  Patient has been weaned off supplemental oxygen.  Blood pressure has also improved significantly.  Patient will be discharged back home to the care of the primary care provider.     Acute COPD  exacerbation -COVID/influenza/RSV PCR negative.   -Patient briefly required BiPAP in the ED due to increased work of breathing  -Patient was managed with steroids, bronchodilators and azithromycin . -Patient has improved significantly.  Patient be discharged back onto the care of the primary care provider.     Hypertensive urgency -Systolic blood pressure in the 200s on presentation.   -Started on nitro drip initially. -Initiated amlodipine , irbesartan  and Imdur   -Nitro has been weaned.     Chronic HFpEF -Last echo done in November 2023 showing EF 60 to 65%, grade 1 diastolic dysfunction.   -No signs of volume overload at this time.   Mild troponin elevation -Likely due to demand ischemia, pattern not consistent with ACS.     CKD stage IIIb Creatinine stable, continue to monitor renal function.   OSA Continue nightly CPAP.   Chronic anemia Hemoglobin stable.   HIV/AIDS Paroxysmal A-fib: Currently in sinus rhythm. CAD: Workup not suggestive of ACS. Hyperlipidemia Postherpetic neuralgia Chronic back pain Gout PAD Pharmacy med rec pending.    Consults: None  Significant Diagnostic Studies:  CHEST - 2 VIEW   COMPARISON:  01/04/2023 and CT chest 03/07/2023   FINDINGS: The heart size and mediastinal contours are within normal limits. Both lungs are clear. The visualized skeletal structures are unremarkable.   IMPRESSION: No active cardiopulmonary disease.     Electronically Signed   By: Norman Gatlin M.D.   On: 03/18/2023 20:11   Discharge Exam: Blood pressure (!) 133/92, pulse 62, temperature 98.2 F (36.8 C), temperature source Oral, resp. rate 20, height 6' (1.829 m), weight 71.1 kg, SpO2 100%.   Disposition: Discharge disposition: 01-Home or Self Care       Discharge Instructions     Diet - low sodium heart  healthy   Complete by: As directed    Increase activity slowly   Complete by: As directed       Allergies as of 03/20/2023   No Known  Allergies      Medication List     STOP taking these medications    hydrALAZINE  100 MG tablet Commonly known as: APRESOLINE    naloxone  4 MG/0.1ML Liqd nasal spray kit Commonly known as: NARCAN    triamcinolone  cream 0.1 % Commonly known as: KENALOG    Vitamin D  (Ergocalciferol ) 1.25 MG (50000 UNIT) Caps capsule Commonly known as: DRISDOL        TAKE these medications    albuterol  108 (90 Base) MCG/ACT inhaler Commonly known as: Ventolin  HFA Inhale 1-2 puffs into the lungs every 6 (six) hours as needed for wheezing or shortness of breath.   amLODipine -valsartan  10-320 MG tablet Commonly known as: EXFORGE  Take 1 tablet by mouth daily.   Aspirin  Low Dose 81 MG tablet Generic drug: aspirin  EC Take 1 tablet (81 mg total) by mouth daily, swallow whole   atorvastatin  80 MG tablet Commonly known as: LIPITOR Take 1 tablet (80 mg total) by mouth daily.   azithromycin  500 MG tablet Commonly known as: ZITHROMAX  Take 1 tablet (500 mg total) by mouth daily for 3 days. Start taking on: March 21, 2023   busPIRone  7.5 MG tablet Commonly known as: BUSPAR  Take 1 tablet (7.5 mg total) by mouth 2 (two) times daily.   Dovato  50-300 MG tablet Generic drug: dolutegravir -lamiVUDine  Take 1 tablet by mouth daily.   gabapentin  300 MG capsule Commonly known as: Neurontin  Take 1 capsule (300 mg total) by mouth 3 (three) times daily as needed (shingles pain).   ipratropium-albuterol  0.5-2.5 (3) MG/3ML Soln Commonly known as: DUONEB Take 3 mLs by nebulization every 6 (six) hours as needed. What changed: reasons to take this   isosorbide  mononitrate 60 MG 24 hr tablet Commonly known as: IMDUR  Take 1 tablet (60 mg total) by mouth daily.   montelukast  10 MG tablet Commonly known as: SINGULAIR  Take 1 tablet (10 mg total) by mouth at bedtime.   nebivolol  5 MG tablet Commonly known as: BYSTOLIC  Take 1 tablet (5 mg total) by mouth daily.   predniSONE  10 MG tablet Commonly known  as: DELTASONE  Prednisone  40 Mg p.o. once daily for 3 days, then 30 Mg p.o. once daily for 3 days, then 20 Mg p.o. once daily for 3 days, then 10 Mg p.o. once daily for 3 days and stop.   Trelegy Ellipta  200-62.5-25 MCG/ACT Aepb Generic drug: Fluticasone -Umeclidin-Vilant Inhale 1 puff into the lungs daily.        Time spent : 35 minutes.  SignedBETHA Leatrice LILLETTE Rosario 03/20/2023, 12:11 PM

## 2023-03-21 ENCOUNTER — Telehealth: Payer: Self-pay

## 2023-03-21 ENCOUNTER — Other Ambulatory Visit (HOSPITAL_COMMUNITY): Payer: Self-pay

## 2023-03-21 NOTE — Transitions of Care (Post Inpatient/ED Visit) (Signed)
   03/21/2023  Name: Kevin [name] MRN: [id number] DOB: 06-Feb-1956  Today's TOC FU Call Status: Today's TOC FU Call Status:: Unsuccessful Call (1st Attempt) Unsuccessful Call (1st Attempt) Date: 03/21/23  Attempted to reach the patient regarding the most recent Inpatient/ED visit.  Follow Up Plan: Additional outreach attempts will be made to reach the patient to complete the Transitions of Care (Post Inpatient/ED visit) call.   Pasco Lunger BSN, Programmer, Systems   Transitions of Care  [place] / [facility], [facility] Direct Dial Number: [phone]  Fax: [phone]

## 2023-03-21 NOTE — Telephone Encounter (Signed)
 This encounter was created in error - please disregard.

## 2023-03-22 ENCOUNTER — Telehealth: Payer: Self-pay

## 2023-03-22 NOTE — Transitions of Care (Post Inpatient/ED Visit) (Signed)
   03/22/2023  Name: Kevin [name] MRN: [id number] DOB: December 20, 1955  Today's TOC FU Call Status: Today's TOC FU Call Status:: Unsuccessful Call (2nd Attempt) Unsuccessful Call (2nd Attempt) Date: 03/22/23  Attempted to reach the patient regarding the most recent Inpatient/ED visit.  Follow Up Plan: Additional outreach attempts will be made to reach the patient to complete the Transitions of Care (Post Inpatient/ED visit) call.   Pasco Lunger BSN, Programmer, Systems   Transitions of Care  [place] / [facility], [facility] Direct Dial Number: [phone]  Fax: [phone]

## 2023-03-23 ENCOUNTER — Other Ambulatory Visit: Payer: Self-pay | Admitting: *Deleted

## 2023-03-23 NOTE — Patient Instructions (Signed)
 Visit Information  [name]  - as a part of your Medicaid benefit, you are eligible for care management and care coordination services at no cost or copay. I was unable to reach you by phone today but would be happy to help you with your health related needs. Please feel free to call me @ [phone].   A member of the Managed Medicaid care management team will reach out to you again over the next 7 days.   Andrea Dimes RN, BSN [place]  [facility] RN Care Manager [phone]

## 2023-03-23 NOTE — Patient Outreach (Signed)
  Medicaid Managed Care   Unsuccessful Attempt Note   03/23/2023 Name: BRADON FESTER MRN: [id number] DOB: 01-28-1956  Referred by: [name], MD Reason for referral : High Risk Managed Medicaid (Unsuccessful RNCM follow up telephone outreach)   An unsuccessful telephone outreach was attempted today. The patient was referred to the case management team for assistance with care management and care coordination.    Follow Up Plan: A HIPAA compliant phone message was left for the patient providing contact information and requesting a return call. and The Managed Medicaid care management team will reach out to the patient again over the next 7 days.    Andrea Dimes RN, BSN [place]  Value[facility] Care Institute [facility] Health RN Care Manager [phone]

## 2023-03-29 ENCOUNTER — Ambulatory Visit: Payer: 59 | Admitting: Family Medicine

## 2023-03-29 ENCOUNTER — Encounter: Payer: Self-pay | Admitting: Family Medicine

## 2023-03-29 ENCOUNTER — Other Ambulatory Visit: Payer: Self-pay

## 2023-03-29 ENCOUNTER — Telehealth: Payer: Self-pay | Admitting: Internal Medicine

## 2023-03-29 VITALS — BP 158/86 | HR 69 | Temp 97.2°F | Wt 163.0 lb

## 2023-03-29 DIAGNOSIS — J439 Emphysema, unspecified: Secondary | ICD-10-CM

## 2023-03-29 DIAGNOSIS — J4489 Other specified chronic obstructive pulmonary disease: Secondary | ICD-10-CM

## 2023-03-29 DIAGNOSIS — N2889 Other specified disorders of kidney and ureter: Secondary | ICD-10-CM | POA: Diagnosis not present

## 2023-03-29 DIAGNOSIS — N1832 Chronic kidney disease, stage 3b: Secondary | ICD-10-CM | POA: Diagnosis not present

## 2023-03-29 DIAGNOSIS — C649 Malignant neoplasm of unspecified kidney, except renal pelvis: Secondary | ICD-10-CM | POA: Diagnosis not present

## 2023-03-29 DIAGNOSIS — Z09 Encounter for follow-up examination after completed treatment for conditions other than malignant neoplasm: Secondary | ICD-10-CM

## 2023-03-29 DIAGNOSIS — I10 Essential (primary) hypertension: Secondary | ICD-10-CM | POA: Diagnosis not present

## 2023-03-29 DIAGNOSIS — B0229 Other postherpetic nervous system involvement: Secondary | ICD-10-CM | POA: Diagnosis not present

## 2023-03-29 MED FILL — Montelukast Sodium Tab 10 MG (Base Equiv): ORAL | 30 days supply | Qty: 30 | Fill #2 | Status: CN

## 2023-03-29 NOTE — Telephone Encounter (Signed)
[facility] needs surgical clearance for patient since he was just in the hospital since his last appointment with pulmonary.

## 2023-03-29 NOTE — Progress Notes (Signed)
Assessment/Plan:   Problem List Items Addressed This Visit       Cardiovascular and Mediastinum   Essential hypertension   Other Visit Diagnoses       HZV (herpes zoster virus) post herpetic neuralgia    -  Primary   Relevant Orders   Ambulatory referral to Pain Clinic      Assessment and Plan    Postherpetic Neuralgia Persistent severe burning pain in the leg following a shingles outbreak. Rash has subsided. Gabapentin was ineffective. Oxycodone and hydrocodone provided some relief. Patient prefers to continue topical lidocaine and avoid new medications. Discussed risks and benefits of duloxetine and amitriptyline, including cardiovascular risks with amitriptyline due to atrial fibrillation. - Refer to [facility] - Continue topical lidocaine  Chronic Obstructive Pulmonary Disease (COPD) Recent hospitalization for COPD exacerbation. Improved breathing and reduced inhaler use. Currently on Trelegy and albuterol PRN. Patient has not quit smoking but is reducing smoking by eating fruit. Discussed risks of not using Trelegy daily, including potential exacerbations. - Continue Trelegy daily - Use albuterol PRN -Follow-up with pulmonology as recommended - Finish prednisone taper  Chronic Kidney Disease (CKD) Actively managing CKD. Concerned about a recently discovered left kidney mass. History of right kidney cancer. Awaiting MRI for further evaluation. Prefers prompt evaluation. - Await MRI for left kidney mass - Follow up with nephrology as needed  Hypertension Blood pressure well-controlled at 158/86. On amlodipine, valsartan, and nebivolol. Actively managing due to concerns about kidney and heart health. Discussed benefits of current regimen. - Continue amlodipine, valsartan, and nebivolol -Continue to follow with hypertensive clinic - Monitor blood pressure regularly  General Health Maintenance Scheduled for colonoscopy on February 18th. Working on resolving  Medicaid issues to proceed with the procedure. - Proceed with colonoscopy on February 18th  Follow-up - Follow up in six months or sooner as needed.       Medications Discontinued During This Encounter  Medication Reason   gabapentin (NEURONTIN) 300 MG capsule     No follow-ups on file.    Subjective:   Encounter date: 03/29/2023  Kevin [name] is a 68 y.o. male who has Shortness of breath; PAF (paroxysmal atrial fibrillation) (HCC) - not started on anticoagulation given low CHA2DS2-VASc score and poor medical follow-up.; Tobacco abuse; COPD with chronic bronchitis and emphysema (HCC); Blurry vision, bilateral; Hypertensive cardiovascular disease; Essential hypertension; OSA (obstructive sleep apnea); Peripheral arterial disease (HCC); CKD stage 3b, GFR 30-44 ml/min (HCC) - baseline SCr 1.8-2.2; Claudication in peripheral vascular disease (HCC); HIV (human immunodeficiency virus infection) (HCC); Easy bruising; CAD (coronary artery disease); Hypertensive urgency; Callus of foot; Acute respiratory failure with hypoxia (HCC); Papillary renal cell carcinoma (HCC); Right renal mass; Renal lesion; Chronic heart failure with preserved ejection fraction (HFpEF) (HCC); HLD (hyperlipidemia); Elevated troponin; Chronic low back pain; Lipoma; Lung nodule, multiple; Resistant hypertension; Skin nodule; Anxiety; COPD exacerbation (HCC); and COPD with acute exacerbation (HCC) on their problem list..   He  has a past medical history of AIDS (acquired immune deficiency syndrome) (HCC) (08/17/2016), Amaurosis fugax (08/18/2022), Chronic diastolic CHF (congestive heart failure), NYHA class 3 (HCC) (01/2016), Chronic lower back pain, CKD (chronic kidney disease), stage III (HCC), COPD (chronic obstructive pulmonary disease) (HCC), Gout, Headache, Heart murmur, Hypertension, Hypertensive crisis (08/15/2017), Lipoma (07/06/2022), OSA on CPAP, PAD (peripheral artery disease) (HCC), PAF (paroxysmal atrial  fibrillation) (HCC) (01/2016), and Papillary renal cell carcinoma (HCC) (06/15/2021).Marland Kitchen   He presents with chief complaint of Hospitalization Follow-up ( hospital follow up 03/18/2023 COPD  and shingles exposure. Patient is requesting RX for pain) .   Discussed the use of AI scribe software for clinical note transcription with the patient, who gave verbal consent to proceed.  History of Present Illness   Kevin [name] is a 68 year old male with COPD who presents for a hospital follow-up after a shingles outbreak.  He was hospitalized from February 2nd to February 4th, 2025, at [facility] due to a COPD flare triggered by a shingles outbreak. The shingles outbreak occurred on his leg and under his arm. While the rash has subsided, he continues to experience burning pain in the affected areas. Gabapentin was prescribed for the nerve pain but was ineffective, and he did not take it. He has tried oxycodone and hydrocodone, which he found helpful in managing the pain and aiding sleep. He is currently using topical lidocaine, which provides some relief by cooling the area.  He has a history of COPD and is currently using Trelegy for management. His breathing has improved, and he has not needed to use his inhaler as frequently. He is tapering off prednisone and has not experienced any recent exacerbations. No recent breathing issues or wheezing.  He is managing his blood pressure with amlodipine, valsartan, and Bystolic (nebivolol), and reports that his blood pressure has been stable. No recent headaches, which he associates with well-controlled blood pressure.  He is working on maintaining his kidney health due to chronic kidney disease and is concerned about a newly identified mass on the left kidney. He has a history of kidney cancer on the right side and is awaiting further evaluation.  He is dealing with significant family stress, including the recent passing of his nephew and his sister's  terminal illness, which he believes could affect his blood pressure. Despite this, he reports that his blood pressure has been well-controlled. He lives in [place] with his family and has a close family network. He has not quit smoking but is trying to manage his urges by eating fruit.      Patient called for transition of care appointment on 03/20/2023   Review of Systems  All other systems reviewed and are negative.   Past Surgical History:  Procedure Laterality Date   IR RADIOLOGIST EVAL & MGMT  05/31/2021   IR RADIOLOGIST EVAL & MGMT  07/26/2021   LEFT HEART CATH AND CORONARY ANGIOGRAPHY N/A 10/23/2016   Procedure: LEFT HEART CATH AND CORONARY ANGIOGRAPHY;  Surgeon: Marykay Lex, MD;  Location: [facility] INVASIVE CV LAB;  Service: Cardiovascular;  Laterality: N/A;   LOWER EXTREMITY ANGIOGRAPHY N/A 07/17/2016   Procedure: Lower Extremity Angiography;  Surgeon: Runell Gess, MD;  Location: [facility] INVASIVE CV LAB;  Service: Cardiovascular;  Laterality: N/A;   LOWER EXTREMITY INTERVENTION N/A 06/05/2016   Procedure: Lower Extremity Intervention;  Surgeon: Runell Gess, MD;  Location: [facility] INVASIVE CV LAB;  Service: Cardiovascular;  Laterality: N/A;   PERIPHERAL VASCULAR ATHERECTOMY Right 07/17/2016   Procedure: Peripheral Vascular Atherectomy;  Surgeon: Runell Gess, MD;  Location: [facility] INVASIVE CV LAB;  Service: Cardiovascular;  Laterality: Right;  SFA   PERIPHERAL VASCULAR INTERVENTION  06/05/2016   Procedure: Peripheral Vascular Intervention;  Surgeon: Runell Gess, MD;  Location: [facility] INVASIVE CV LAB;  Service: Cardiovascular;;  left SFA   RADIOLOGY WITH ANESTHESIA Right 06/29/2021   Procedure: RIGHT RENAL CRYOABLATION;  Surgeon: Richarda Overlie, MD;  Location: [facility] ORS;  Service: Anesthesiology;  Laterality: Right;    Outpatient Medications Prior to Visit  Medication Sig Dispense Refill   albuterol (VENTOLIN HFA) 108 (90 Base) MCG/ACT inhaler Inhale 1-2 puffs into the lungs every 6 (six)  hours as needed for wheezing or shortness of breath. 6.7 g 3   amLODipine-valsartan (EXFORGE) 10-320 MG tablet Take 1 tablet by mouth daily. 30 tablet 5   aspirin EC 81 MG tablet Take 1 tablet (81 mg total) by mouth daily, swallow whole 30 tablet 12   atorvastatin (LIPITOR) 80 MG tablet Take 1 tablet (80 mg total) by mouth daily. 90 tablet 1   busPIRone (BUSPAR) 7.5 MG tablet Take 1 tablet (7.5 mg total) by mouth 2 (two) times daily. 180 tablet 0   dolutegravir-lamiVUDine (DOVATO) 50-300 MG tablet Take 1 tablet by mouth daily. 30 tablet 11   Fluticasone-Umeclidin-Vilant (TRELEGY ELLIPTA) 200-62.5-25 MCG/ACT AEPB Inhale 1 puff into the lungs daily. 60 each 5   ipratropium-albuterol (DUONEB) 0.5-2.5 (3) MG/3ML SOLN Take 3 mLs by nebulization every 6 (six) hours as needed. (Patient taking differently: Take 3 mLs by nebulization every 6 (six) hours as needed (for wheezing and shortness of breath).) 360 mL 12   isosorbide mononitrate (IMDUR) 60 MG 24 hr tablet Take 1 tablet (60 mg total) by mouth daily. 30 tablet 5   montelukast (SINGULAIR) 10 MG tablet Take 1 tablet (10 mg total) by mouth at bedtime. 30 tablet 5   nebivolol (BYSTOLIC) 5 MG tablet Take 1 tablet (5 mg total) by mouth daily. 30 tablet 5   predniSONE (DELTASONE) 10 MG tablet Take 4 tablets (40 mg total) by mouth daily for 3 days, THEN 3 tablets (30 mg total) daily for 3 days, THEN 2 tablets (20 mg total) daily for 3 days, THEN 1 tablet (10 mg total) daily for 3 days THEN STOP 30 tablet 0   gabapentin (NEURONTIN) 300 MG capsule Take 1 capsule (300 mg total) by mouth 3 (three) times daily as needed (shingles pain). 90 capsule 0   No facility-administered medications prior to visit.    Family History  Problem Relation Age of Onset   High blood pressure Mother    Lupus Mother    Seizures Daughter    Heart attack Daughter     Social History   Socioeconomic History   Marital status: Significant Other    Spouse name: Not on file    Number of children: Not on file   Years of education: Not on file   Highest education level: Not on file  Occupational History   Not on file  Tobacco Use   Smoking status: Every Day    Current packs/day: 0.10    Average packs/day: 0.1 packs/day for 42.0 years (4.2 ttl pk-yrs)    Types: Cigarettes, E-cigarettes    Passive exposure: Never   Smokeless tobacco: Never   Tobacco comments:    1-2 cigarettes a day 09/29/22 Tay  Vaping Use   Vaping status: Every Day   Substances: Nicotine, Flavoring  Substance and Sexual Activity   Alcohol use: Not Currently    Alcohol/week: 2.0 standard drinks of alcohol    Types: 2 Cans of beer per week    Comment: rare   Drug use: Not Currently    Comment: rare   Sexual activity: Not Currently    Comment: declined condoms  Other Topics Concern   Not on file  Social History Narrative   Right handed   Social Drivers of Health   Financial Resource Strain: High Risk (10/05/2022)   Overall Financial Resource Strain (CARDIA)  Difficulty of Paying Living Expenses: Hard  Food Insecurity: No Food Insecurity (03/19/2023)   Hunger Vital Sign    Worried About Running Out of Food in the Last Year: Never true    Ran Out of Food in the Last Year: Never true  Transportation Needs: No Transportation Needs (03/19/2023)   PRAPARE - Administrator, Civil Service (Medical): No    Lack of Transportation (Non-Medical): No  Physical Activity: Inactive (10/05/2022)   Exercise Vital Sign    Days of Exercise per Week: 0 days    Minutes of Exercise per Session: 0 min  Stress: Stress Concern Present (05/04/2022)   [employer] of Occupational Health - Occupational Stress Questionnaire    Feeling of Stress : To some extent  Social Connections: Moderately Isolated (03/19/2023)   Social Connection and Isolation Panel [NHANES]    Frequency of Communication with Friends and Family: More than three times a week    Frequency of Social Gatherings with Friends and  Family: Never    Attends Religious Services: Never    Database administrator or Organizations: No    Attends Banker Meetings: Never    Marital Status: Married  Catering manager Violence: Not At Risk (03/19/2023)   Humiliation, Afraid, Rape, and Kick questionnaire    Fear of Current or Ex-Partner: No    Emotionally Abused: No    Physically Abused: No    Sexually Abused: No                                                                                                  Objective:  Physical Exam: BP (!) 158/86   Pulse 69   Temp (!) 97.2 F (36.2 C) (Temporal)   Wt 163 lb (73.9 kg)   SpO2 97%   BMI 22.11 kg/m    Wt Readings from Last 3 Encounters:  03/29/23 163 lb (73.9 kg)  03/18/23 156 lb 12 oz (71.1 kg)  03/14/23 156 lb 12.8 oz (71.1 kg)    Physical Exam Constitutional:      Appearance: Normal appearance.  HENT:     Head: Normocephalic and atraumatic.     Right Ear: Hearing normal.     Left Ear: Hearing normal.     Nose: Nose normal.  Eyes:     General: No scleral icterus.       Right eye: No discharge.        Left eye: No discharge.     Extraocular Movements: Extraocular movements intact.  Cardiovascular:     Rate and Rhythm: Normal rate and regular rhythm.     Heart sounds: Normal heart sounds.  Pulmonary:     Effort: Pulmonary effort is normal.     Breath sounds: Normal breath sounds.  Abdominal:     Palpations: Abdomen is soft.     Tenderness: There is no abdominal tenderness.  Skin:    General: Skin is warm.  Neurological:     General: No focal deficit present.     Mental Status: He is alert.     Cranial Nerves: No cranial  nerve deficit.  Psychiatric:        Mood and Affect: Mood normal.        Behavior: Behavior normal.        Thought Content: Thought content normal.        Judgment: Judgment normal.     DG Chest 2 View Result Date: 03/18/2023 CLINICAL DATA:  Shortness of breath all day.  Shingles for a month. EXAM: CHEST - 2 VIEW  COMPARISON:  01/04/2023 and CT chest 03/07/2023 FINDINGS: The heart size and mediastinal contours are within normal limits. Both lungs are clear. The visualized skeletal structures are unremarkable. IMPRESSION: No active cardiopulmonary disease. Electronically Signed   By: Minerva Fester M.D.   On: 03/18/2023 20:11   CT Chest Wo Contrast Result Date: 03/13/2023 CLINICAL DATA:  Renal cell carcinoma. Lung nodules. * Tracking Code: BO * EXAM: CT CHEST WITHOUT CONTRAST TECHNIQUE: Multidetector CT imaging of the chest was performed following the standard protocol without IV contrast. RADIATION DOSE REDUCTION: This exam was performed according to the departmental dose-optimization program which includes automated exposure control, adjustment of the mA and/or kV according to patient size and/or use of iterative reconstruction technique. COMPARISON:  CT 09/06/2022.  PET-CT scan 09/21/2022. FINDINGS: Cardiovascular: The thoracic aorta is normal course and caliber on this non IV contrast exam with mild atherosclerotic calcified plaque. Heart nonenlarged. No pericardial effusion. Mild coronary artery calcifications are seen. Please correlate for other coronary risk factors. Mediastinum/Nodes: Slightly patulous thoracic esophagus. Preserved thyroid gland. On this non IV contrast exam there is no specific abnormal lymph node enlargement identified in the axillary regions, hilum or mediastinum on the prior examination there were some hypermetabolic nodes identified in the mediastinum and right lung hilum. Specific example of a node in the right lung hilum measured 7 mm in short axis on the prior examination and today 6 mm on series 2, image 68. Other nodes are similar as well. Lungs/Pleura: No consolidation, pneumothorax or effusion. Some underlying chronic lung changes identified. The semi-solid nodular area in the right lower lobe which previously had an AP dimension of 15 mm, today when measured in the same fashion on  series 8, image 93 measures 13 mm. This is not have increased uptake on PET. 7 mm right upper lobe more solid nodule on the previous examination today on series 8, image 39 measures 6 mm. Smaller ground-glass in the right lung on series 8, image 70 is stable. Subtle areas of nodularity centrally in the middle lobe on series 8 image 86 and 88 are stable. These are small. No new dominant lung nodules. The area of hypermetabolism at the right lung apex does not have a CT correlate today. Similar area in the lingula without a CT correlate. Upper Abdomen: Along the upper abdomen the adrenal glands are preserved. There is focal atrophy along the posterior aspect of the right kidney. Please correlate with prior intervention or other process. Musculoskeletal: Mild degenerative changes along the spine. IMPRESSION: Multiple solid and semi solid lung nodules are unchanged in size compared to the prior CT scan. Recommend continued short-term surveillance. Previously there were some hypermetabolic small mediastinal hilar nodes which are unchanged in size. Additional attention on follow-up. Aortic Atherosclerosis (ICD10-I70.0) and Emphysema (ICD10-J43.9). Electronically Signed   By: Karen Kays M.D.   On: 03/13/2023 17:52   DG Chest 2 View Result Date: 01/04/2023 CLINICAL DATA:  Chest pain. EXAM: CHEST - 2 VIEW COMPARISON:  December 21, 2022. FINDINGS: The heart size and mediastinal contours are  within normal limits. Both lungs are clear. The visualized skeletal structures are unremarkable. IMPRESSION: No active cardiopulmonary disease. Electronically Signed   By: Lupita Raider M.D.   On: 01/04/2023 15:07    Recent Results (from the past 2160 hours)  Basic metabolic panel     Status: Abnormal   Collection Time: 03/18/23  7:47 PM  Result Value Ref Range   Sodium 141 135 - 145 mmol/L   Potassium 4.1 3.5 - 5.1 mmol/L   Chloride 112 (H) 98 - 111 mmol/L   CO2 22 22 - 32 mmol/L   Glucose, Bld 93 70 - 99 mg/dL     Comment: Glucose reference range applies only to samples taken after fasting for at least 8 hours.   BUN 13 8 - 23 mg/dL   Creatinine, Ser 7.82 (H) 0.61 - 1.24 mg/dL   Calcium 8.7 (L) 8.9 - 10.3 mg/dL   GFR, Estimated 43 (L) >60 mL/min    Comment: (NOTE) Calculated using the CKD-EPI Creatinine Equation (2021)    Anion gap 7 5 - 15    Comment: Performed at [facility], [address], [place], [place] [address]  CBC     Status: Abnormal   Collection Time: 03/18/23  7:47 PM  Result Value Ref Range   WBC 8.7 4.0 - 10.5 K/uL   RBC 3.63 (L) 4.22 - 5.81 MIL/uL   Hemoglobin 11.5 (L) 13.0 - 17.0 g/dL   HCT 30.8 (L) 65.7 - 84.6 %   MCV 94.2 80.0 - 100.0 fL   MCH 31.7 26.0 - 34.0 pg   MCHC 33.6 30.0 - 36.0 g/dL   RDW 96.2 95.2 - 84.1 %   Platelets 300 150 - 400 K/uL   nRBC 0.0 0.0 - 0.2 %    Comment: Performed at [facility], [address], [place], [place] [address]  Troponin I (High Sensitivity)     Status: Abnormal   Collection Time: 03/18/23  7:47 PM  Result Value Ref Range   Troponin I (High Sensitivity) 38 (H) <18 ng/L    Comment: (NOTE) Elevated high sensitivity troponin I (hsTnI) values and significant  changes across serial measurements may suggest ACS but many other  chronic and acute conditions are known to elevate hsTnI results.  Refer to the "Links" section for chest pain algorithms and additional  guidance. Performed at [facility], [address] [place], [place] [address]   Troponin I (High Sensitivity)     Status: Abnormal   Collection Time: 03/18/23  9:32 PM  Result Value Ref Range   Troponin I (High Sensitivity) 37 (H) <18 ng/L    Comment: (NOTE) Elevated high sensitivity troponin I (hsTnI) values and significant  changes across serial measurements may suggest ACS but many other  chronic and acute conditions are known to elevate hsTnI results.  Refer to the "Links" section for chest pain algorithms and additional  guidance. Performed at  [facility], [address], [place], [place] [address]   I-Stat venous blood gas, ED     Status: Abnormal   Collection Time: 03/18/23  9:34 PM  Result Value Ref Range   pH, Ven 7.328 7.25 - 7.43   pCO2, Ven 45.7 44 - 60 mmHg   pO2, Ven 41 32 - 45 mmHg   Bicarbonate 24.0 20.0 - 28.0 mmol/L   TCO2 25 22 - 32 mmol/L   O2 Saturation 72 %   Acid-base deficit 2.0 0.0 - 2.0  mmol/L   Sodium 144 135 - 145 mmol/L   Potassium 4.7 3.5 - 5.1 mmol/L   Calcium, Ion 1.19 1.15 - 1.40 mmol/L   HCT 36.0 (L) 39.0 - 52.0 %   Hemoglobin 12.2 (L) 13.0 - 17.0 g/dL   Sample type VENOUS   Resp panel by RT-PCR (RSV, Flu A&B, Covid) Anterior Nasal Swab     Status: None   Collection Time: 03/18/23 10:25 PM   Specimen: Anterior Nasal Swab  Result Value Ref Range   SARS Coronavirus 2 by RT PCR NEGATIVE NEGATIVE   Influenza A by PCR NEGATIVE NEGATIVE   Influenza B by PCR NEGATIVE NEGATIVE    Comment: (NOTE) The Xpert Xpress SARS-CoV-2/FLU/RSV plus assay is intended as an aid in the diagnosis of influenza from Nasopharyngeal swab specimens and should not be used as a sole basis for treatment. Nasal washings and aspirates are unacceptable for Xpert Xpress SARS-CoV-2/FLU/RSV testing.  Fact Sheet for Patients: BloggerCourse.com  Fact Sheet for Healthcare Providers: SeriousBroker.it  This test is not yet approved or cleared by the [place] FDA and has been authorized for detection and/or diagnosis of SARS-CoV-2 by FDA under an Emergency Use Authorization (EUA). This EUA will remain in effect (meaning this test can be used) for the duration of the COVID-19 declaration under Section 564(b)(1) of the Act, 21 U.S.C. section 360bbb-3(b)(1), unless the authorization is terminated or revoked.     Resp Syncytial Virus by PCR NEGATIVE NEGATIVE    Comment: (NOTE) Fact Sheet for Patients: BloggerCourse.com  Fact Sheet for  Healthcare Providers: SeriousBroker.it  This test is not yet approved or cleared by the [place] FDA and has been authorized for detection and/or diagnosis of SARS-CoV-2 by FDA under an Emergency Use Authorization (EUA). This EUA will remain in effect (meaning this test can be used) for the duration of the COVID-19 declaration under Section 564(b)(1) of the Act, 21 U.S.C. section 360bbb-3(b)(1), unless the authorization is terminated or revoked.  Performed at [facility], [address], [place], [place] [address]   Brain natriuretic peptide     Status: None   Collection Time: 03/18/23 11:55 PM  Result Value Ref Range   B Natriuretic Peptide 39.9 0.0 - 100.0 pg/mL    Comment: Performed at [facility], [address], [place], [place] [address]  Comprehensive metabolic panel     Status: Abnormal   Collection Time: 03/19/23  4:37 AM  Result Value Ref Range   Sodium 139 135 - 145 mmol/L   Potassium 4.5 3.5 - 5.1 mmol/L   Chloride 114 (H) 98 - 111 mmol/L   CO2 19 (L) 22 - 32 mmol/L   Glucose, Bld 184 (H) 70 - 99 mg/dL    Comment: Glucose reference range applies only to samples taken after fasting for at least 8 hours.   BUN 15 8 - 23 mg/dL   Creatinine, Ser 0.98 (H) 0.61 - 1.24 mg/dL   Calcium 8.5 (L) 8.9 - 10.3 mg/dL   Total Protein 6.4 (L) 6.5 - 8.1 g/dL   Albumin 3.3 (L) 3.5 - 5.0 g/dL   AST 18 15 - 41 U/L   ALT 12 0 - 44 U/L   Alkaline Phosphatase 62 38 - 126 U/L   Total Bilirubin 0.6 0.0 - 1.2 mg/dL   GFR, Estimated 45 (L) >60 mL/min    Comment: (NOTE) Calculated using the CKD-EPI Creatinine Equation (2021)    Anion gap 6 5 - 15    Comment:  Performed at [facility], [address] [place], [place] [address]        Garner Nash, MD, MS

## 2023-04-02 NOTE — Telephone Encounter (Signed)
Checked with Maralyn Sago and she states since the pt was in a flare last visit, he would need ov to be cleared.   I spoke with Barb at [facility] to let her know

## 2023-04-02 NOTE — Telephone Encounter (Signed)
Dr Maple Hudson- can I use a held spot on your schedule to get him clearance for colonoscopy? The APPS are full for several weeks.

## 2023-04-02 NOTE — Telephone Encounter (Signed)
Yes Ma'am. °

## 2023-04-02 NOTE — Telephone Encounter (Signed)
Called pt to schedule appt and there was no answer- LMTCB

## 2023-04-03 ENCOUNTER — Ambulatory Visit (HOSPITAL_COMMUNITY): Admission: RE | Admit: 2023-04-03 | Payer: 59 | Source: Home / Self Care | Admitting: Gastroenterology

## 2023-04-03 ENCOUNTER — Encounter (HOSPITAL_COMMUNITY): Admission: RE | Payer: Self-pay | Source: Home / Self Care

## 2023-04-03 DIAGNOSIS — B0229 Other postherpetic nervous system involvement: Secondary | ICD-10-CM | POA: Insufficient documentation

## 2023-04-03 DIAGNOSIS — N2889 Other specified disorders of kidney and ureter: Secondary | ICD-10-CM | POA: Insufficient documentation

## 2023-04-03 SURGERY — COLONOSCOPY WITH PROPOFOL
Anesthesia: Monitor Anesthesia Care

## 2023-04-06 ENCOUNTER — Other Ambulatory Visit: Payer: Self-pay

## 2023-04-06 ENCOUNTER — Other Ambulatory Visit (HOSPITAL_COMMUNITY): Payer: Self-pay

## 2023-04-06 NOTE — Progress Notes (Signed)
Specialty Pharmacy Ongoing Clinical Assessment Note  Kevin [name] is a 68 y.o. male who is being followed by the specialty pharmacy service for RxSp HIV   Patient's specialty medication(s) reviewed today: Dolutegravir-lamiVUDine (Dovato)   Missed doses in the last 4 weeks: 0   Patient/Caregiver did not have any additional questions or concerns.   Therapeutic benefit summary: Patient is achieving benefit (07/06/22 HIV RNA Not Detected)   Adverse events/side effects summary: No adverse events/side effects   Patient's therapy is appropriate to: Continue    Goals Addressed             This Visit's Progress    Achieve Undetectable HIV Viral Load < 20       Patient is on track. Patient will maintain adherence         Follow up:  6 months  Bobette Mo Specialty Pharmacist

## 2023-04-06 NOTE — Progress Notes (Signed)
Specialty Pharmacy Refill Coordination Note  Kevin [name] is a 68 y.o. male contacted today regarding refills of specialty medication(s) Dolutegravir-lamiVUDine (Dovato)   Patient requested Delivery   Delivery date: 04/10/23   Verified address: [address] [name]   Medication will be filled on 04/09/23.

## 2023-04-07 NOTE — Progress Notes (Unsigned)
 Subjective:    Kevin [name] ID: Kevin [name], male    DOB: 1955/11/20, 68 y.o.   MRN: [id number]  Dr Delton Coombes HPI 68 year old Kevin [name] with a history of tobacco use (40+ pk-yrs) and COPD, renal cell cancer, HIV on therapy, hypertension and atrial fibrillation with diastolic CHF, OSA on CPAP, gout.  Kevin [name] has been seen by Dr. Thora Lance in our office for his COPD and also pulmonary nodular disease on a CT chest 11/2020.  Repeat CT scan of the chest was done as below as well as subsequent PET scan.  CT scan of the chest 09/06/2022 reviewed by me shows multiple stable right sided solid and subsolid pulmonary nodules that were stable or regressed.  2 nodules had increased in size including a 7 mm solid right upper lobe nodule, 15 mm mixed density right lower lobe nodule.  This prompted PET scan. Kevin [name] has been experiencing episodes of acute dyspnea, has been back and forth to the ED multiple times this year. Was started on Trelegy in June. Can happen w exertion, chores or at rest. Does not wake him from sleep. Kevin [name] coughs through the day, produces mucous in the am. ? Whether there may be a component of panic / anxiety. Albuterol nebs 3x a day. Unclear triggers. Does have trouble in hot air.   PET scan 09/21/2022 reviewed by me, shows mild hypermetabolism within the mediastinal nodes with SUV max 4.1 of unclear significance.  The right upper lobe peribronchovascular nodules measured at 5 mm.  None of the groundglass nodules had any hypermetabolism.  There was some nonspecific hypermetabolism in the right apex as well as inferior lingula and an area of some minimal airspace disease that was new compared with the CT done 7/24  [name] 11/29/2022 --68 year old smoker with COPD, HIV, renal cell cancer, hypertension, A-fib with diastolic dysfunction, OSA on CPAP.  Kevin [name] is preparing to undergo colonoscopy with [facility] gastroenterology and needs preoperative surgical evaluation.  I saw him in August for an abnormal CT scan of the chest with  small right upper lobe and right lower lobe pulmonary nodules.  These look as they may have evolved on CT but they were negative on PET scan.  We plan for repeat imaging in January 2025 Kevin [name] was in the emergency department 11/18/2022 with acute exacerbation of COPD, received an extended nebulizer treatment, steroids and was sent out on prednisone and doxycycline.  His maintenance medication has been Trelegy, albuterol or DuoNeb as needed. Kevin [name] has been off the trelegy for over a month. Kevin [name] is still smoking a few cig a day. Kevin [name] is feeling better.   PFT from 11/24/22 reviewed and shows severe obstruction without a bronchodilator response, FEV1 42% predicted.  Lung volumes are hyperinflated.  Diffusion capacity decreased and corrects to normal range when adjusted for alveolar volume ==================================================    12/14/22- 66 yoM new to me for sleep evaluation-  see above from Dr Delton Coombes (due back with Dr Delton Coombes in January)..   Smoker("2 cigs/day") with COPD, HIV, renal cell cancer, hypertension, A-fib with diastolic dysfunction, OSA on CPAP -Neb Muccomyst, Neb Duoneb, Albuterol hfa, Trelegy 200, Singulair,  NPSG 05/07/19- AHI 62.9/hr, desat to 92%, BIPAP to 18/14. Frequent Centrals> recommended AutoServoVentilation study. Body weight 180 lbs ASV titration 06/10/19- EPAP min 7, Max 7, PS min5, Max 20, Breath rate AutoBrMode Epworth score-15 BP on arrival 212/130    no BP meds taken today Body weight today  Daughter here Bedtime 3AM, sleep latency 30 minutes, No WASO, up 8AM. Admits doesn't use BiPAP every night.  02/27/23- 67 yoM followed for OSA, complicated by Smoker("2 cigs/day") with COPD (Dr Delton Coombes), HIV, renal cell cancer, hypertension, A-fib with diastolic dysfunction, OSA on CPAP  Neb Duoneb, Albuterol hfa, Trelegy 200, Singulair,  ASV titration 06/10/19- EPAP min 7, Max 7, PS min5, Max 20, Breath rate AutoBrMode/ [facility] > not  using ASV due to frequent Centrals. At last visit we asked [facility] to refit mask. Lung nodule and COPD are followed by Dr Delton Coombes. Discussed the use of AI scribe software for clinical note transcription with the Kevin [name], who gave verbal consent to proceed.  History of Present Illness   The Kevin [name], with a history of COPD and sleep apnea, presents for a follow-up visit. Kevin [name] reports a unique sleep pattern, going to bed around 4-5 PM, waking up around 9-10 PM, and then going back to sleep around 8 AM until around noon.Marland Kitchen Kevin [name] denies any issues with this sleep pattern and reports feeling rested. Kevin [name] does not use a home ventilator or CPAP machine. Kevin [name] found these unhelpful.  Kevin [name] denies taking any sleep aids and reports occasional coffee use. Kevin [name] is not working and doesn't have to comply with a schedule. Since Kevin [name] is comfortable with this pattern, I felt it best not to try to force a more conventional sleep schedule with medication.  The Kevin [name] also reports needing refills for his albuterol rescue inhaler and DuoNeb nebulizer solution. Kevin [name] expresses a preference for the nebulizer solution in single-dose ampules rather than small bottles. Kevin [name] also mentions an upcoming CT scan to monitor lung nodules, expressing concern due to a family history of lung and brain cancer. Kevin [name] will be seeing Kandice Robinsons, NP later this month for CT and Pulmonary f/u.   Assessment and Plan:    Sleep Disorder   Irregular sleep schedule with sleep onset at 4-5 PM and awakening at 9-10 PM and then 8AM- 12 Noon. No reported distress or functional impairment. No interest in sleep medication.   -No changes to current sleep habits as Kevin [name] is not experiencing distress or functional impairment.    COPD   Request for refills of albuterol rescue inhaler and DuoNeb nebulizer solution. No reported exacerbations or distress.   -Refill albuterol rescue inhaler and DuoNeb nebulizer solution to [employer].    Lung Nodules   Follow-up CT  scan scheduled for 03/07/2023 with follow-up appointment with Jairo Ben, NP on 03/14/2023.   -Continue with scheduled CT scan and follow-up appointment.    General Health Maintenance / Followup Plans   -Schedule follow-up appointment in 1 year for sleep and breathing at night. If changes occur, Kevin [name] to make an appointment as needed.   04/09/23- 67 yoM light Smoker followed for OSA, complicated by Smoker("2 cigs/day") with COPD (Dr Delton Coombes), HIV, renal cell cancer, hypertension, A-fib with diastolic dysfunction, OSA on CPAP  Neb Duoneb, Albuterol hfa, Trelegy 200, Singulair,  ASV titration 06/10/19- EPAP min 7, Max 7, PS min5, Max 20, Breath rate AutoBrMode/ [facility] > not using ASV due to frequent Centrals. We had asked [facility] to refit mask. Lung nodule and COPD are followed by Dr Delton Coombes. ? Preop clearance     ROS-see HPI   + = positive Constitutional:    weight loss, night sweats, fevers, chills, fatigue, lassitude.   HEENT:    headaches, difficulty swallowing, tooth/dental problems, sore throat,       sneezing, itching, ear ache,  nasal congestion, post nasal drip, snoring CV:    chest pain, orthopnea, PND, swelling in lower extremities, anasarca,     dizziness, +palpitations Resp:   +shortness of breath with exertion or at rest.                productive cough,   non-productive cough, coughing up of blood.              change in color of mucus.  wheezing.   Skin:    rash or lesions. GI:  No-   heartburn, indigestion, abdominal pain, nausea, vomiting, diarrhea,                 change in bowel habits, loss of appetite GU: dysuria, change in color of urine, no urgency or frequency.   flank pain. MS:   joint pain, stiffness, decreased range of motion, back pain. Neuro-     nothing unusual Psych:  change in mood or affect.  depression or +anxiety.   memory loss.  OBJ- Physical Exam     +note BP General- Alert, Oriented, Affect-appropriate, Distress- none acute, ++slender Skin- rash-none,  lesions- none, excoriation- none Lymphadenopathy- none Head- atraumatic            Eyes- Gross vision intact, PERRLA, conjunctivae and secretions clear            Ears- Hearing, canals-normal            Nose- Clear, no-Septal dev, mucus, polyps, erosion, perforation             Throat- Mallampati II , mucosa clear , drainage- none, tonsils- atrophic Neck- flexible , trachea midline, no stridor , thyroid nl, carotid no bruit Chest - symmetrical excursion , unlabored           Heart/CV- RRR , no murmur , no gallop  , no rub, nl s1 s2                           - JVD- none , edema- none, stasis changes- none, varices- none           Lung- clear to P&A/ +diminished, wheeze- none, cough- none , dullness-none, rub- none           Chest wall-  Abd-  Br/ Gen/ Rectal- Not done, not indicated Extrem- cyanosis- none, clubbing, none, atrophy- none, strength- nl Neuro- grossly intact to observation

## 2023-04-09 ENCOUNTER — Other Ambulatory Visit (HOSPITAL_COMMUNITY): Payer: Self-pay

## 2023-04-09 ENCOUNTER — Ambulatory Visit (INDEPENDENT_AMBULATORY_CARE_PROVIDER_SITE_OTHER): Payer: 59 | Admitting: Internal Medicine

## 2023-04-09 ENCOUNTER — Encounter: Payer: Self-pay | Admitting: Internal Medicine

## 2023-04-09 ENCOUNTER — Other Ambulatory Visit: Payer: Self-pay

## 2023-04-09 VITALS — BP 167/88 | HR 68 | Temp 98.1°F | Resp 18 | Wt 165.0 lb

## 2023-04-09 DIAGNOSIS — D4100 Neoplasm of uncertain behavior of unspecified kidney: Secondary | ICD-10-CM

## 2023-04-09 DIAGNOSIS — G4733 Obstructive sleep apnea (adult) (pediatric): Secondary | ICD-10-CM | POA: Diagnosis not present

## 2023-04-09 DIAGNOSIS — I10 Essential (primary) hypertension: Secondary | ICD-10-CM

## 2023-04-09 DIAGNOSIS — J449 Chronic obstructive pulmonary disease, unspecified: Secondary | ICD-10-CM

## 2023-04-09 NOTE — Patient Instructions (Signed)
 You are clear from a pulmonary standpoint to go forward with colonoscopy (Dr Ewing Schlein) as planned. I agree it should be done at hospital.  I hope you get a good report about thee mass on your left kidney.

## 2023-04-09 NOTE — Telephone Encounter (Signed)
  I have faxed note to Dr. Ewing Schlein.[phone]

## 2023-04-10 ENCOUNTER — Other Ambulatory Visit (HOSPITAL_COMMUNITY): Payer: Self-pay

## 2023-04-10 ENCOUNTER — Telehealth: Payer: Self-pay

## 2023-04-10 ENCOUNTER — Other Ambulatory Visit: Payer: Self-pay

## 2023-04-10 MED FILL — Montelukast Sodium Tab 10 MG (Base Equiv): ORAL | 30 days supply | Qty: 30 | Fill #2 | Status: CN

## 2023-04-10 NOTE — Progress Notes (Signed)
..   Medicaid Managed Care   Unsuccessful Outreach Note  04/10/2023 Name: Kevin [name] MRN: [id number] DOB: 04/16/1955  Referred by: [name], MD Reason for referral : High Risk Managed Medicaid (I attempted to reach the patient today to get his missed phone appt with the MM RNCM rescheduled. I was not able to leave him a message.)   A second unsuccessful telephone outreach was attempted today. The patient was referred to the case management team for assistance with care management and care coordination.   Follow Up Plan: The care management team will reach out to the patient again over the next 7 days.   Weston Settle [facility], [facility] Guide Direct Dial: [phone]  Fax: [phone]

## 2023-04-11 ENCOUNTER — Other Ambulatory Visit (HOSPITAL_COMMUNITY): Payer: Self-pay

## 2023-04-11 ENCOUNTER — Other Ambulatory Visit: Payer: Self-pay

## 2023-04-11 DIAGNOSIS — Z9889 Other specified postprocedural states: Secondary | ICD-10-CM | POA: Diagnosis not present

## 2023-04-11 DIAGNOSIS — N281 Cyst of kidney, acquired: Secondary | ICD-10-CM | POA: Diagnosis not present

## 2023-04-11 DIAGNOSIS — N289 Disorder of kidney and ureter, unspecified: Secondary | ICD-10-CM | POA: Diagnosis not present

## 2023-04-11 DIAGNOSIS — N2889 Other specified disorders of kidney and ureter: Secondary | ICD-10-CM | POA: Diagnosis not present

## 2023-04-11 MED FILL — Montelukast Sodium Tab 10 MG (Base Equiv): ORAL | 30 days supply | Qty: 30 | Fill #2 | Status: AC

## 2023-04-12 ENCOUNTER — Other Ambulatory Visit: Payer: Self-pay

## 2023-04-12 ENCOUNTER — Encounter (HOSPITAL_BASED_OUTPATIENT_CLINIC_OR_DEPARTMENT_OTHER): Payer: 59 | Admitting: Family

## 2023-04-13 ENCOUNTER — Other Ambulatory Visit: Payer: Self-pay | Admitting: Gastroenterology

## 2023-04-17 ENCOUNTER — Encounter (HOSPITAL_COMMUNITY): Payer: Self-pay | Admitting: Gastroenterology

## 2023-04-17 ENCOUNTER — Telehealth: Payer: Self-pay | Admitting: Family Medicine

## 2023-04-17 NOTE — Telephone Encounter (Signed)
 Pt called e2c2 about referral about pain medication but I did not see this in refferals. In is in the CRM

## 2023-04-18 ENCOUNTER — Telehealth: Payer: Self-pay

## 2023-04-18 NOTE — Progress Notes (Signed)
..   Medicaid Managed Care   Unsuccessful Outreach Note  04/18/2023 Name: Kevin [name] MRN: [id number] DOB: 1955/06/28  Referred by: [name], MD Reason for referral : High Risk Managed Medicaid   Third unsuccessful telephone outreach was attempted today. The patient was referred to the case management team for assistance with care management and care coordination. The patient's primary care provider has been notified of our unsuccessful attempts to make or maintain contact with the patient. The care management team is pleased to engage with this patient at any time in the future should he/she be interested in assistance from the care management team.   Follow Up Plan: We have been unable to make contact with the patient for follow up. The care management team is available to follow up with the patient after provider conversation with the patient regarding recommendation for care management engagement and subsequent re-referral to the care management team.   Weston Settle [facility], [facility] Guide Direct Dial: [phone]  Fax: [phone]

## 2023-04-18 NOTE — Telephone Encounter (Signed)
 Referral printed and faxed to number below.

## 2023-04-18 NOTE — Telephone Encounter (Signed)
 Copied from CRM [id number]. Topic: Referral - Question >> Apr 18, 2023 12:41 PM Fredrich Romans wrote: Reason for CRM: [facility] called in stating that they dont have the referral that was sent over on behalf of patient on 03/29/2023. She would like to know if it could be refaxed. Fax#:[phone]

## 2023-04-19 ENCOUNTER — Other Ambulatory Visit: Payer: Self-pay | Admitting: Student

## 2023-04-19 ENCOUNTER — Other Ambulatory Visit: Payer: Self-pay | Admitting: *Deleted

## 2023-04-19 ENCOUNTER — Other Ambulatory Visit: Payer: Self-pay

## 2023-04-19 ENCOUNTER — Telehealth: Payer: Self-pay

## 2023-04-19 DIAGNOSIS — I1A0 Resistant hypertension: Secondary | ICD-10-CM

## 2023-04-19 MED ORDER — AMLODIPINE BESYLATE-VALSARTAN 10-320 MG PO TABS
1.0000 | ORAL_TABLET | Freq: Every day | ORAL | 2 refills | Status: DC
  Filled 2023-05-07 (×2): qty 30, 30d supply, fill #0
  Filled 2023-05-30 – 2023-05-31 (×2): qty 30, 30d supply, fill #1
  Filled 2023-06-22 – 2023-06-28 (×3): qty 30, 30d supply, fill #2
  Filled 2023-07-18 – 2023-07-24 (×3): qty 30, 30d supply, fill #3
  Filled 2023-08-13 – 2023-08-23 (×2): qty 30, 30d supply, fill #4
  Filled 2023-09-25: qty 30, 30d supply, fill #5
  Filled 2023-10-22 – 2023-10-31 (×3): qty 30, 30d supply, fill #6
  Filled 2023-11-26: qty 30, 30d supply, fill #7
  Filled 2023-12-24 – 2023-12-31 (×3): qty 30, 30d supply, fill #8

## 2023-04-19 MED FILL — Montelukast Sodium Tab 10 MG (Base Equiv): ORAL | 30 days supply | Qty: 30 | Fill #3 | Status: CN

## 2023-04-19 NOTE — Telephone Encounter (Signed)
 Copied from CRM [id number]. Topic: Referral - Status >> Apr 19, 2023  2:43 PM Sonny Dandy B wrote: Reason for CRM: Pt called to follow up on a referral for a neurologist. Pt is requesting the referral be faxed to [facility] at [phone]

## 2023-04-19 NOTE — Telephone Encounter (Signed)
 Reached out to patient to confirm he was contacting about neurology referral or pain referral. He confirmed he was checking the status of the pain referral. I advised him that it was faxed successfully yesterday to the [phone] fax. I advised him I would resend it again through fax and epic.

## 2023-04-19 NOTE — Patient Outreach (Signed)
 Care Management/Care Coordination  RN Case Manager Case Closure Note  04/19/2023 Name: Kevin [name] MRN: [id number] DOB: 11-25-1955  Kevin [name] is a 68 y.o. year old male who is a primary care patient of [name], MD. The care management/care coordination team was consulted for assistance with chronic disease management and/or care coordination needs.   Care Plan : RN Care Manager Plan of Care  Updates made by [name], RN since 04/19/2023 12:00 AM  Completed 04/19/2023   Problem: Health Management needs related to COPD Resolved 04/19/2023     Long-Range Goal: Development of Plan of Care to address Health Management needs related to COPD Completed 04/19/2023  Start Date: 05/03/2022  Expected End Date: 04/13/2023  Note:   Current Barriers:  Chronic Disease Management support and education needs related to HTN and COPD Unable to maintain contact with patient  RNCM Clinical Goal(s):  Patient will verbalize understanding of plan for management of HTN and COPD as evidenced by patient reports take all medications exactly as prescribed and will call provider for medication related questions as evidenced by patient reports    attend all scheduled medical appointments:  Pulmonology on 02/27/23, Nephrology 02/28/23, CT on 03/07/23, Pulmonology on 03/14/23 and Cardiology 04/12/23  as evidenced by provider documentation        through collaboration with RN Care manager, provider, and care team.   Interventions: Inter-disciplinary care team collaboration (see longitudinal plan of care) Evaluation of current treatment plan related to  self management and patient's adherence to plan as established by provider Provided therapeutic listening Discussed Colonoscopy will be rescheduled upon shingles healing completely and clearance by Pulmonology   Hypertension Interventions:  (Status:  Goal Not Met.) Long Term Goal-Patient reports BP at home 160/80 Last practice recorded BP readings:  BP  Readings from Last 3 Encounters:  02/08/23 (!) 178/98  02/05/23 (!) 156/82  01/13/23 (!) 198/100   Most recent eGFR/CrCl:  Lab Results  Component Value Date   EGFR 36 (L) 07/06/2022    No components found for: "CRCL"  Evaluation of current treatment plan related to hypertension self management and patient's adherence to plan as established by provider Reviewed medications with patient and discussed importance of compliance Discussed complications of poorly controlled blood pressure such as heart disease, stroke, circulatory complications, vision complications, kidney impairment, sexual dysfunction Assessed social determinant of health barriers Advised patient to take all medications to all provider appointments Discussed the importance of smoking cessation, improving diet and exercise in controlling HTN Reviewed appointment for Nephrology per referral with [facility] [phone], 02/28/23 at 10am-provided appointment details Explained the importance of taking medications at the same time each day and checking BP 1-2 hours after taking medications Reviewed provider notes and discussed Explained that albuterol inhaler is an as needed medication, he will have to call the pharmacy to request refills  Smoking Cessation Interventions:  (Status:  Goal Not Met.) Long Term Goal patient has not smoked in "a couple of days" Reviewed smoking history:  tobacco abuse of >20 years; currently smoking 1-2 ppd Previous quit attempts, unsuccessful several /successful using none  Reports smoking within 30 minutes of waking up Reports triggers to smoke include: stress Reports motivation to quit smoking includes: family and health On a scale of 1-10, reports MOTIVATION to quit is 10 On a scale of 1-10, reports CONFIDENCE in quitting is 10  Evaluation of current treatment plan reviewed Provided patient with printed smoking cessation educational materials Provided contact information for   Quit Line  (1-800-QUIT-NOW) Assessed social determinant of health barriers   COPD: (Status: Goal Not Met.) Long Term Goal  Reviewed medications with patient Provided instruction about proper use of medications used for management of COPD including inhalers Advised patient to self assesses COPD action plan zone and make appointment with provider if in the yellow zone for 48 hours without improvement Advised patient to engage in light exercise as tolerated 3-5 days a week to aid in the the management of COPD Provided education about and advised patient to utilize infection prevention strategies to reduce risk of respiratory infection Discussed the importance of adequate rest and management of fatigue with COPD Assessed social determinant of health barriers Reviewed sleep study evaluation and discussed with patient-waiting to be scheduled for new mask fitting-advised patient to reach out to [facility] regarding new mask Advised patient to continue to work on smoking cessation Discussed scheduled with Pulmonology on 02/27/23, CT on 03/07/23 and Pulmonology to review CT results on 03/14/23 Advised patient to discuss needing albuterol prescription updated with Pulmonology Ensured patient has albuterol inhaler  Patient Goals/Self-Care Activities: Take medications as prescribed   Attend all scheduled provider appointments Call pharmacy for medication refills 3-7 days in advance of running out of medications Call provider office for new concerns or questions  Work with the social worker to address care coordination needs and will continue to work with the clinical team to address health care and disease management related needs identify and avoid work-related triggers develop a rescue plan develop a new routine to improve sleep get at least 7 to 8 hours of sleep at night       Plan: We have been unable to make contact with the patient for follow up. The care management team is available to follow up with the  patient should new care management/care coordination needs arise.   [name] RN, BSN [place]  [facility] RN Care Manager [phone]

## 2023-04-19 NOTE — Telephone Encounter (Signed)
 Contacted [facility] to confirm if faxes were received. Was on hold for 10 minutes, left a voicemail advising them that fax was sent and to confirm if they didn't receive it.

## 2023-04-20 ENCOUNTER — Other Ambulatory Visit: Payer: Self-pay

## 2023-04-24 ENCOUNTER — Encounter (HOSPITAL_COMMUNITY): Payer: Self-pay | Admitting: Gastroenterology

## 2023-04-24 ENCOUNTER — Ambulatory Visit (HOSPITAL_BASED_OUTPATIENT_CLINIC_OR_DEPARTMENT_OTHER): Admitting: Anesthesiology

## 2023-04-24 ENCOUNTER — Ambulatory Visit (HOSPITAL_COMMUNITY): Admitting: Anesthesiology

## 2023-04-24 ENCOUNTER — Other Ambulatory Visit: Payer: Self-pay

## 2023-04-24 ENCOUNTER — Ambulatory Visit (HOSPITAL_COMMUNITY)
Admission: RE | Admit: 2023-04-24 | Discharge: 2023-04-24 | Disposition: A | Discharge status: Home / Self Care | Payer: 59 | Attending: Gastroenterology | Admitting: Gastroenterology

## 2023-04-24 ENCOUNTER — Other Ambulatory Visit (HOSPITAL_COMMUNITY): Payer: Self-pay

## 2023-04-24 ENCOUNTER — Encounter (HOSPITAL_COMMUNITY)
Admission: RE | Disposition: A | Discharge status: Home / Self Care | Payer: Self-pay | Source: Home / Self Care | Attending: Gastroenterology

## 2023-04-24 DIAGNOSIS — Z7982 Long term (current) use of aspirin: Secondary | ICD-10-CM | POA: Diagnosis not present

## 2023-04-24 DIAGNOSIS — Z1211 Encounter for screening for malignant neoplasm of colon: Secondary | ICD-10-CM

## 2023-04-24 DIAGNOSIS — D123 Benign neoplasm of transverse colon: Secondary | ICD-10-CM | POA: Diagnosis not present

## 2023-04-24 DIAGNOSIS — C649 Malignant neoplasm of unspecified kidney, except renal pelvis: Secondary | ICD-10-CM | POA: Diagnosis not present

## 2023-04-24 DIAGNOSIS — Z21 Asymptomatic human immunodeficiency virus [HIV] infection status: Secondary | ICD-10-CM | POA: Diagnosis not present

## 2023-04-24 DIAGNOSIS — J449 Chronic obstructive pulmonary disease, unspecified: Secondary | ICD-10-CM | POA: Insufficient documentation

## 2023-04-24 DIAGNOSIS — G4730 Sleep apnea, unspecified: Secondary | ICD-10-CM | POA: Diagnosis not present

## 2023-04-24 DIAGNOSIS — I2510 Atherosclerotic heart disease of native coronary artery without angina pectoris: Secondary | ICD-10-CM | POA: Insufficient documentation

## 2023-04-24 DIAGNOSIS — I110 Hypertensive heart disease with heart failure: Secondary | ICD-10-CM | POA: Insufficient documentation

## 2023-04-24 DIAGNOSIS — K648 Other hemorrhoids: Secondary | ICD-10-CM | POA: Diagnosis not present

## 2023-04-24 DIAGNOSIS — Z87891 Personal history of nicotine dependence: Secondary | ICD-10-CM | POA: Diagnosis not present

## 2023-04-24 DIAGNOSIS — I509 Heart failure, unspecified: Secondary | ICD-10-CM | POA: Diagnosis not present

## 2023-04-24 DIAGNOSIS — Z860100 Personal history of colon polyps, unspecified: Secondary | ICD-10-CM | POA: Diagnosis not present

## 2023-04-24 DIAGNOSIS — I739 Peripheral vascular disease, unspecified: Secondary | ICD-10-CM | POA: Insufficient documentation

## 2023-04-24 DIAGNOSIS — D120 Benign neoplasm of cecum: Secondary | ICD-10-CM | POA: Insufficient documentation

## 2023-04-24 DIAGNOSIS — I4891 Unspecified atrial fibrillation: Secondary | ICD-10-CM | POA: Diagnosis not present

## 2023-04-24 DIAGNOSIS — K644 Residual hemorrhoidal skin tags: Secondary | ICD-10-CM | POA: Insufficient documentation

## 2023-04-24 DIAGNOSIS — Z09 Encounter for follow-up examination after completed treatment for conditions other than malignant neoplasm: Secondary | ICD-10-CM | POA: Diagnosis not present

## 2023-04-24 HISTORY — PX: POLYPECTOMY: SHX5525

## 2023-04-24 HISTORY — PX: COLONOSCOPY: SHX5424

## 2023-04-24 SURGERY — COLONOSCOPY
Anesthesia: Monitor Anesthesia Care

## 2023-04-24 MED ORDER — PROPOFOL 10 MG/ML IV BOLUS
INTRAVENOUS | Status: DC | PRN
  Administered 2023-04-24: 10 mg via INTRAVENOUS
  Administered 2023-04-24: 20 mg via INTRAVENOUS

## 2023-04-24 MED ORDER — ONDANSETRON HCL 4 MG/2ML IJ SOLN
INTRAMUSCULAR | Status: DC | PRN
  Administered 2023-04-24 (×2): 4 mg via INTRAVENOUS

## 2023-04-24 MED ORDER — PROPOFOL 500 MG/50ML IV EMUL
INTRAVENOUS | Status: DC | PRN
  Administered 2023-04-24: 145 ug/kg/min via INTRAVENOUS

## 2023-04-24 MED ORDER — SODIUM CHLORIDE 0.9 % IV SOLN
INTRAVENOUS | Status: DC | PRN
  Administered 2023-04-24: 500 mL via INTRAMUSCULAR

## 2023-04-24 MED ORDER — EPHEDRINE SULFATE-NACL 50-0.9 MG/10ML-% IV SOSY
PREFILLED_SYRINGE | INTRAVENOUS | Status: DC | PRN
  Administered 2023-04-24: 10 mg via INTRAVENOUS

## 2023-04-24 NOTE — Anesthesia Preprocedure Evaluation (Signed)
 Anesthesia Evaluation  Patient identified by MRN, date of birth, ID band Patient awake    Reviewed: Allergy & Precautions, NPO status , Patient's Chart, lab work & pertinent test results, reviewed documented beta blocker date and time   Airway Mallampati: II  TM Distance: >3 FB Neck ROM: Full    Dental  (+) Teeth Intact, Dental Advisory Given   Pulmonary sleep apnea and Continuous Positive Airway Pressure Ventilation , COPD,  COPD inhaler, Current Smoker and Patient abstained from smoking.   Pulmonary exam normal breath sounds clear to auscultation       Cardiovascular hypertension, Pt. on medications and Pt. 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  Reproductive/Obstetrics                              Anesthesia Physical Anesthesia Plan  ASA: 3  Anesthesia Plan: MAC   Post-op Pain Management:    Induction: Intravenous  PONV Risk Score and Plan: 0 and TIVA and Treatment may vary due to age or medical condition  Airway Management Planned: Natural Airway and Simple Face Mask  Additional Equipment:   Intra-op Plan:   Post-operative Plan:   Informed Consent: I have reviewed the patients History and Physical, chart, labs and discussed the procedure including the risks, benefits and alternatives for the proposed anesthesia with the patient or authorized representative who has indicated  his/her understanding and acceptance.     Dental advisory given  Plan Discussed with: CRNA and Anesthesiologist  Anesthesia Plan Comments:          Anesthesia Quick Evaluation

## 2023-04-24 NOTE — Anesthesia Procedure Notes (Signed)
 Procedure Name: MAC Date/Time: 04/24/2023 8:44 AM  Performed by: Nelle Don, CRNAPre-anesthesia Checklist: Patient identified, Emergency Drugs available, Suction available and Patient being monitored Oxygen Delivery Method: Simple face mask

## 2023-04-24 NOTE — Anesthesia Postprocedure Evaluation (Signed)
 Anesthesia Post Note  Patient: Kevin [name]  Procedure(s) Performed: COLONOSCOPY POLYPECTOMY     Patient location during evaluation: Endoscopy Anesthesia Type: MAC Level of consciousness: awake and alert Pain management: pain level controlled Vital Signs Assessment: post-procedure vital signs reviewed and stable Respiratory status: spontaneous breathing, nonlabored ventilation, respiratory function stable and patient connected to nasal cannula oxygen Cardiovascular status: stable and blood pressure returned to baseline Postop Assessment: no apparent nausea or vomiting Anesthetic complications: no   No notable events documented.  Last Vitals:  Vitals:   04/24/23 0940 04/24/23 0950  BP: (!) 177/90 (!) 180/90  Pulse: 64 63  Resp: 15 (!) 22  Temp:    SpO2: 100% (!) 87%    Last Pain:  Vitals:   04/24/23 0950  TempSrc:   PainSc: 0-No pain                 Collene Schlichter

## 2023-04-24 NOTE — Op Note (Signed)
 [facility] Patient Name: Kevin [name] Procedure Date: 04/24/2023 MRN: [id number] Attending MD: Vida Rigger , MD, [id number] Date of Birth: [birth year] CSN: [id number] Age: 68 Admit Type: Outpatient Procedure:                Colonoscopy Indications:              High risk colon cancer surveillance: Personal                            history of colonic polyps, Last colonoscopy:                            February 2023 Providers:                Vida Rigger, MD, Fransisca Connors, Geoffery Lyons,                            Technician Referring MD:              Medicines:                Monitored Anesthesia Care Complications:            No immediate complications. Estimated Blood Loss:     Estimated blood loss: none. Procedure:                Pre-Anesthesia Assessment:                           - Prior to the procedure, a History and Physical                            was performed, and patient medications and                            allergies were reviewed. The patient's tolerance of                            previous anesthesia was also reviewed. The risks                            and benefits of the procedure and the sedation                            options and risks were discussed with the patient.                            All questions were answered, and informed consent                            was obtained. Prior Anticoagulants: The patient has                            taken no anticoagulant or antiplatelet agents                            except for aspirin. ASA Grade Assessment: III -  A                            patient with severe systemic disease. After                            reviewing the risks and benefits, the patient was                            deemed in satisfactory condition to undergo the                            procedure.                           After obtaining informed consent, the colonoscope                             was passed under direct vision. Throughout the                            procedure, the patient's blood pressure, pulse, and                            oxygen saturations were monitored continuously. The                            PCF-HQ190L ([id number] Olympus colonoscope was                            introduced through the anus and advanced to the the                            cecum, identified by appendiceal orifice and                            ileocecal valve. The ileocecal valve, appendiceal                            orifice, and rectum were photographed. The                            colonoscopy was performed without difficulty. The                            patient tolerated the procedure well. The quality                            of the bowel preparation was adequate to identify                            polyps greater than 5 mm in size. Scope In: 8:54:35 AM Scope Out: 9:23:09 AM Scope Withdrawal Time: 0 hours 24 minutes 48 seconds  Total Procedure Duration: 0 hours 28 minutes 34 seconds  Findings:  External and internal hemorrhoids were found during retroflexion, during       perianal exam and during digital exam. The hemorrhoids were small.      A diminutive polyp was found in the cecum. The polyp was semi-sessile.       Biopsies were taken with a cold forceps for histology.      Four semi-sessile polyps were found in the transverse colon and hepatic       flexure. The polyps were small in size. These polyps were removed with a       cold snare. Resection and retrieval were complete.      The exam was otherwise without abnormality on direct and retroflexion       views. Impression:               - External and internal hemorrhoids.                           - One diminutive polyp in the cecum. Biopsied.                           - Four small polyps in the transverse colon and at                            the hepatic flexure, removed with a cold snare.                             Resected and retrieved.                           - The examination was otherwise normal on direct                            and retroflexion views. Moderate Sedation:      Not Applicable - Patient had care per Anesthesia. Recommendation:           - Patient has a contact number available for                            emergencies. The signs and symptoms of potential                            delayed complications were discussed with the                            patient. Return to normal activities tomorrow.                            Written discharge instructions were provided to the                            patient.                           - Soft diet today.                           -  Continue present medications.                           - Await pathology results.                           - Repeat colonoscopy in 3 - 5 years for                            surveillance based on pathology results.                           - Return to GI office PRN.                           - Telephone GI clinic for pathology results in 1                            week.                           - Telephone GI clinic if symptomatic PRN. Procedure Code(s):        --- Professional ---                           [phone], Colonoscopy, flexible; with removal of                            tumor(s), polyp(s), or other lesion(s) by snare                            technique                           45380, 59, Colonoscopy, flexible; with biopsy,                            single or multiple Diagnosis Code(s):        --- Professional ---                           Z86.010, Personal history of colonic polyps                           D12.0, Benign neoplasm of cecum                           D12.3, Benign neoplasm of transverse colon (hepatic                            flexure or splenic flexure) CPT copyright 2022 [employer]. All rights reserved. The codes documented  in this report are preliminary and upon coder review may  be revised to meet current compliance requirements. Vida Rigger, MD 04/24/2023 9:37:54 AM This report has been signed electronically. Number of Addenda: 0

## 2023-04-24 NOTE — Discharge Instructions (Addendum)
 YOU HAD AN ENDOSCOPIC PROCEDURE TODAY: Refer to the procedure report and other information in the discharge instructions given to you for any specific questions about what was found during the examination. If this information does not answer your questions, please call the [facility] office at [phone] to clarify.   YOU SHOULD EXPECT: Some feelings of bloating in the abdomen. Passage of more gas than usual. Walking can help get rid of the air that was put into your GI tract during the procedure and reduce the bloating. If you had a lower endoscopy (such as a colonoscopy or flexible sigmoidoscopy) you may notice spotting of blood in your stool or on the toilet paper. Some abdominal soreness may be present for a day or two, also.  DIET: Your first meal following the procedure should be a light meal and then it is ok to progress to your normal diet. A half-sandwich or bowl of soup is an example of a good first meal. Heavy or fried foods are harder to digest and may make you feel nauseous or bloated. Drink plenty of fluids but you should avoid alcoholic beverages for 24 hours.    ACTIVITY: Your care partner should take you home directly after the procedure. You should plan to take it easy, moving slowly for the rest of the day. You can resume normal activity the day after the procedure however YOU SHOULD NOT DRIVE, use power tools, machinery or perform tasks that involve climbing or major physical exertion for 24 hours (because of the sedation medicines used during the test).   SYMPTOMS TO REPORT IMMEDIATELY: A gastroenterologist can be reached at any hour. Please call [phone]  for any of the following symptoms:  Following lower endoscopy (colonoscopy, flexible sigmoidoscopy) Excessive amounts of blood in the stool  Significant tenderness, worsening of abdominal pains  Swelling of the abdomen that is new, acute  Fever of 100 or higher  Call if GI question or problem otherwise soft solids first  meal today like breakfast food and slowly advance diet  FOLLOW UP:  If any biopsies were taken you will be contacted by phone or by letter within the next 1-3 weeks. Call [phone]  if you have not heard about the biopsies in 3 weeks.  Please also call with any specific questions about appointments or follow up tests.

## 2023-04-24 NOTE — Transfer of Care (Signed)
 Immediate Anesthesia Transfer of Care Note  Patient: Kevin [name]  Procedure(s) Performed: COLONOSCOPY POLYPECTOMY  Patient Location: PACU and Endoscopy Unit  Anesthesia Type:MAC  Level of Consciousness: sedated and responds to stimulation  Airway & Oxygen Therapy: Patient Spontanous Breathing and Patient connected to face mask oxygen  Post-op Assessment: Report given to RN  Post vital signs: stable  Last Vitals:  Vitals Value Taken Time  BP 137/82 04/24/23 0930  Temp    Pulse 63 04/24/23 0931  Resp 13 04/24/23 0931  SpO2 100 % 04/24/23 0931  Vitals shown include unfiled device data.  Last Pain:  Vitals:   04/24/23 0929  TempSrc:   PainSc: Asleep         Complications: No notable events documented.

## 2023-04-24 NOTE — Progress Notes (Signed)
 [name] 8:21 AM  Subjective: Patient doing well without any breathing problems and no GI complaints and we rediscussed the procedure and he does take 1 aspirin a day but no blood thinners  Objective: Vital signs stable afebrile no acute distress exam please see preassessment evaluation recent labs reviewed  Assessment: History of colon polyps in patient with multiple medical problems  Plan: Okay to proceed with colonoscopy today with anesthesia assistance  [facility]  office [phone] After 5PM or if no answer call [phone]

## 2023-04-25 ENCOUNTER — Encounter (HOSPITAL_COMMUNITY): Payer: Self-pay | Admitting: Gastroenterology

## 2023-04-25 LAB — SURGICAL PATHOLOGY

## 2023-04-30 ENCOUNTER — Other Ambulatory Visit: Payer: Self-pay

## 2023-05-02 ENCOUNTER — Other Ambulatory Visit: Payer: Self-pay

## 2023-05-02 NOTE — Progress Notes (Signed)
 Specialty Pharmacy Refill Coordination Note  Kevin [name] is a 68 y.o. male contacted today regarding refills of specialty medication(s) Dolutegravir-lamiVUDine (Dovato)   Patient requested Delivery   Delivery date: 05/09/23   Verified address: [address] [name]   Medication will be filled on 05/08/23.

## 2023-05-07 ENCOUNTER — Other Ambulatory Visit: Payer: Self-pay | Admitting: Family Medicine

## 2023-05-07 ENCOUNTER — Other Ambulatory Visit (HOSPITAL_COMMUNITY): Payer: Self-pay

## 2023-05-07 ENCOUNTER — Other Ambulatory Visit: Payer: Self-pay

## 2023-05-07 MED ORDER — ASPIRIN 81 MG PO TBEC
81.0000 mg | DELAYED_RELEASE_TABLET | Freq: Every day | ORAL | 12 refills | Status: AC
  Filled 2023-05-07: qty 30, 30d supply, fill #0
  Filled 2023-05-30 – 2023-05-31 (×2): qty 30, 30d supply, fill #1
  Filled 2023-06-22 – 2023-06-28 (×3): qty 30, 30d supply, fill #2
  Filled 2023-07-18 – 2023-07-24 (×3): qty 30, 30d supply, fill #3
  Filled 2023-08-13 – 2023-08-23 (×2): qty 30, 30d supply, fill #4
  Filled 2023-09-25: qty 30, 30d supply, fill #5
  Filled 2023-10-22 – 2023-11-01 (×3): qty 30, 30d supply, fill #6

## 2023-05-07 MED FILL — Montelukast Sodium Tab 10 MG (Base Equiv): ORAL | 30 days supply | Qty: 30 | Fill #3 | Status: CN

## 2023-05-07 MED FILL — Montelukast Sodium Tab 10 MG (Base Equiv): ORAL | 30 days supply | Qty: 30 | Fill #3 | Status: AC

## 2023-05-08 ENCOUNTER — Other Ambulatory Visit: Payer: Self-pay

## 2023-05-15 ENCOUNTER — Ambulatory Visit (HOSPITAL_COMMUNITY): Admission: EM | Admit: 2023-05-15 | Discharge: 2023-05-15 | Disposition: A | Discharge status: Home / Self Care

## 2023-05-15 ENCOUNTER — Encounter (HOSPITAL_COMMUNITY): Payer: Self-pay | Admitting: *Deleted

## 2023-05-15 ENCOUNTER — Emergency Department (HOSPITAL_COMMUNITY)

## 2023-05-15 ENCOUNTER — Encounter (HOSPITAL_COMMUNITY): Payer: Self-pay

## 2023-05-15 ENCOUNTER — Telehealth: Payer: Self-pay

## 2023-05-15 ENCOUNTER — Other Ambulatory Visit: Payer: Self-pay

## 2023-05-15 ENCOUNTER — Emergency Department (HOSPITAL_COMMUNITY)
Admission: EM | Admit: 2023-05-15 | Discharge: 2023-05-15 | Disposition: A | Discharge status: Home / Self Care | Attending: Emergency Medicine | Admitting: Emergency Medicine

## 2023-05-15 DIAGNOSIS — C762 Malignant neoplasm of abdomen: Secondary | ICD-10-CM | POA: Diagnosis not present

## 2023-05-15 DIAGNOSIS — Z79899 Other long term (current) drug therapy: Secondary | ICD-10-CM | POA: Insufficient documentation

## 2023-05-15 DIAGNOSIS — Z7982 Long term (current) use of aspirin: Secondary | ICD-10-CM | POA: Diagnosis not present

## 2023-05-15 DIAGNOSIS — N281 Cyst of kidney, acquired: Secondary | ICD-10-CM | POA: Diagnosis not present

## 2023-05-15 DIAGNOSIS — R110 Nausea: Secondary | ICD-10-CM | POA: Diagnosis not present

## 2023-05-15 DIAGNOSIS — R1084 Generalized abdominal pain: Secondary | ICD-10-CM | POA: Diagnosis not present

## 2023-05-15 DIAGNOSIS — J449 Chronic obstructive pulmonary disease, unspecified: Secondary | ICD-10-CM | POA: Diagnosis not present

## 2023-05-15 DIAGNOSIS — N189 Chronic kidney disease, unspecified: Secondary | ICD-10-CM | POA: Diagnosis not present

## 2023-05-15 DIAGNOSIS — Z7951 Long term (current) use of inhaled steroids: Secondary | ICD-10-CM | POA: Insufficient documentation

## 2023-05-15 DIAGNOSIS — R109 Unspecified abdominal pain: Secondary | ICD-10-CM | POA: Diagnosis not present

## 2023-05-15 DIAGNOSIS — K429 Umbilical hernia without obstruction or gangrene: Secondary | ICD-10-CM | POA: Diagnosis not present

## 2023-05-15 DIAGNOSIS — I129 Hypertensive chronic kidney disease with stage 1 through stage 4 chronic kidney disease, or unspecified chronic kidney disease: Secondary | ICD-10-CM | POA: Insufficient documentation

## 2023-05-15 LAB — COMPREHENSIVE METABOLIC PANEL WITH GFR
ALT: 12 U/L (ref 0–44)
AST: 22 U/L (ref 15–41)
Albumin: 3.9 g/dL (ref 3.5–5.0)
Alkaline Phosphatase: 75 U/L (ref 38–126)
Anion gap: 14 (ref 5–15)
BUN: 24 mg/dL — ABNORMAL HIGH (ref 8–23)
CO2: 18 mmol/L — ABNORMAL LOW (ref 22–32)
Calcium: 8.9 mg/dL (ref 8.9–10.3)
Chloride: 107 mmol/L (ref 98–111)
Creatinine, Ser: 2.28 mg/dL — ABNORMAL HIGH (ref 0.61–1.24)
GFR, Estimated: 31 mL/min — ABNORMAL LOW (ref >60)
Glucose, Bld: 72 mg/dL (ref 70–99)
Potassium: 4.6 mmol/L (ref 3.5–5.1)
Sodium: 139 mmol/L (ref 135–145)
Total Bilirubin: 1 mg/dL (ref 0.0–1.2)
Total Protein: 7.7 g/dL (ref 6.5–8.1)

## 2023-05-15 LAB — CBC
HCT: 37 % — ABNORMAL LOW (ref 39.0–52.0)
Hemoglobin: 12.7 g/dL — ABNORMAL LOW (ref 13.0–17.0)
MCH: 32.1 pg (ref 26.0–34.0)
MCHC: 34.3 g/dL (ref 30.0–36.0)
MCV: 93.4 fL (ref 80.0–100.0)
Platelets: 335 10*3/uL (ref 150–400)
RBC: 3.96 MIL/uL — ABNORMAL LOW (ref 4.22–5.81)
RDW: 14.6 % (ref 11.5–15.5)
WBC: 13 10*3/uL — ABNORMAL HIGH (ref 4.0–10.5)
nRBC: 0 % (ref 0.0–0.2)

## 2023-05-15 LAB — URINALYSIS, ROUTINE W REFLEX MICROSCOPIC
Bilirubin Urine: NEGATIVE
Glucose, UA: NEGATIVE mg/dL
Hgb urine dipstick: NEGATIVE
Ketones, ur: NEGATIVE mg/dL
Leukocytes,Ua: NEGATIVE
Nitrite: NEGATIVE
Protein, ur: 100 mg/dL — AB
Specific Gravity, Urine: 1.015 (ref 1.005–1.030)
pH: 5 (ref 5.0–8.0)

## 2023-05-15 LAB — TROPONIN I (HIGH SENSITIVITY)
Troponin I (High Sensitivity): 33 ng/L — ABNORMAL HIGH (ref <18)
Troponin I (High Sensitivity): 27 ng/L — ABNORMAL HIGH (ref <18)

## 2023-05-15 LAB — LIPASE, BLOOD: Lipase: 52 U/L — ABNORMAL HIGH (ref 11–51)

## 2023-05-15 MED ORDER — LACTATED RINGERS IV BOLUS
1000.0000 mL | Freq: Once | INTRAVENOUS | Status: AC
  Administered 2023-05-15: 1000 mL via INTRAVENOUS

## 2023-05-15 MED ORDER — MORPHINE SULFATE (PF) 4 MG/ML IV SOLN
4.0000 mg | Freq: Once | INTRAVENOUS | Status: AC
  Administered 2023-05-15: 4 mg via INTRAVENOUS
  Filled 2023-05-15: qty 1

## 2023-05-15 MED ORDER — ISOSORBIDE MONONITRATE ER 30 MG PO TB24
60.0000 mg | ORAL_TABLET | Freq: Every day | ORAL | Status: DC
  Administered 2023-05-15: 60 mg via ORAL
  Filled 2023-05-15: qty 2

## 2023-05-15 MED ORDER — PANTOPRAZOLE SODIUM 40 MG IV SOLR
40.0000 mg | Freq: Once | INTRAVENOUS | Status: AC
  Administered 2023-05-15: 40 mg via INTRAVENOUS
  Filled 2023-05-15: qty 10

## 2023-05-15 MED ORDER — IOHEXOL 350 MG/ML SOLN
75.0000 mL | Freq: Once | INTRAVENOUS | Status: AC | PRN
  Administered 2023-05-15: 75 mL via INTRAVENOUS

## 2023-05-15 MED ORDER — PANTOPRAZOLE SODIUM 20 MG PO TBEC
20.0000 mg | DELAYED_RELEASE_TABLET | Freq: Every day | ORAL | 0 refills | Status: DC
  Filled 2023-05-15: qty 14, 14d supply, fill #0

## 2023-05-15 MED ORDER — LACTATED RINGERS IV BOLUS: 1000.0000 mL | Freq: Once | INTRAVENOUS | Status: DC

## 2023-05-15 MED ORDER — AMLODIPINE BESYLATE 5 MG PO TABS
10.0000 mg | ORAL_TABLET | Freq: Once | ORAL | Status: AC
  Administered 2023-05-15: 10 mg via ORAL
  Filled 2023-05-15: qty 2

## 2023-05-15 MED ORDER — SUCRALFATE 1 G PO TABS
1.0000 g | ORAL_TABLET | Freq: Three times a day (TID) | ORAL | 0 refills | Status: DC
Start: 2023-05-15 — End: 2023-05-30
  Filled 2023-05-15: qty 56, 14d supply, fill #0

## 2023-05-15 NOTE — ED Triage Notes (Signed)
 Pt here for abd pain all over that started at 0430. C/O rectal burning. Denies bloody stools. C/o nausea.

## 2023-05-15 NOTE — ED Provider Notes (Signed)
 [facility] AT [facility] Provider Note   CSN: [id number] Arrival date & time: 05/15/23  1314     History Chief Complaint  Patient presents with   Abdominal Pain    Kevin [name] is a 68 y.o. male with h/o COPD, HTN, CKD presents to the ER today for evaluation of abdominal pain. He reports that pain is mainly periumbilical. The patient report that he "isn't good at describing things" so is unable to further describe the pain. He reports that this pain started at 0400 today. He reports that he stays up during the night and sleeps during the day. He reports that he was laying in bed watching TV when the pain started. He reports taht he felt nausea and was dry heaving, but no actual emesis. He reports that he was having multiple bowel movements, without blood or black/tar consistency. He reports he did have from anal burning with defecation, but only with this.  Denies any dysuria, hematuria, fever, chest pain, shortness of breath.  He reports he still able to pass gas.  Colonoscopy was around 3 to 4 weeks ago.  He had a colonoscopy last year where they removed 17 polyps.  He reports he only removed 4 polyps this time.  He reports that yesterday he did have 2 liver pudding and eggs sandwiches as well as 2 glasses of wine which is new for him.  He reports this was new wine and not old or wine.  He also reports that he did have some Cajun food that was spicy at children's birthday party this weekend.  He denies any allergies to any medications.  Daily tobacco use.  Denies any drugs. No medications trialed prior to arrival.    Abdominal Pain Associated symptoms: nausea   Associated symptoms: no chest pain, no chills, no constipation, no cough, no diarrhea, no dysuria, no fever, no hematuria, no shortness of breath and no vomiting        Home Medications Prior to Admission medications   Medication Sig Start Date End Date Taking? Authorizing Provider  albuterol  (VENTOLIN HFA) 108 (90 Base) MCG/ACT inhaler Inhale 1-2 puffs into the lungs every 6 (six) hours as needed for wheezing or shortness of breath. 03/14/23   Bevelyn Ngo, NP  amLODipine-valsartan (EXFORGE) 10-320 MG tablet Take 1 tablet by mouth daily. 04/19/23   Corrin Parker, PA-C  aspirin EC (ASPIRIN LOW DOSE) 81 MG tablet Take 1 tablet (81 mg total) by mouth daily, swallow whole 05/07/23   [name], MD  atorvastatin (LIPITOR) 80 MG tablet Take 1 tablet (80 mg total) by mouth daily. 02/08/23   Alver Sorrow, NP  BISACODYL 5 MG EC tablet Take by mouth as directed. 04/22/23   [provider]  busPIRone (BUSPAR) 7.5 MG tablet Take 1 tablet (7.5 mg total) by mouth 2 (two) times daily. 03/12/23 06/13/23  [name], MD  dolutegravir-lamiVUDine (DOVATO) 50-300 MG tablet Take 1 tablet by mouth daily. 07/06/22   Randall Hiss, MD  Fluticasone-Umeclidin-Vilant (TRELEGY ELLIPTA) 200-62.5-25 MCG/ACT AEPB Inhale 1 puff into the lungs daily. 09/08/22     ipratropium-albuterol (DUONEB) 0.5-2.5 (3) MG/3ML SOLN Take 3 mLs by nebulization every 6 (six) hours as needed. 02/27/23   Waymon Budge, MD  isosorbide mononitrate (IMDUR) 60 MG 24 hr tablet Take 1 tablet (60 mg total) by mouth daily. 02/08/23 06/21/23  Alver Sorrow, NP  montelukast (SINGULAIR) 10 MG tablet Take 1 tablet (10  mg total) by mouth at bedtime. 02/15/23   Cobb, Ruby Cola, NP  nebivolol (BYSTOLIC) 5 MG tablet Take 1 tablet (5 mg total) by mouth daily. 02/08/23   Alver Sorrow, NP  polyethylene glycol-electrolytes (NULYTELY) 420 g solution MIX AND DRINK AS DIRECTED 04/22/23   [provider]      Allergies    Patient has no known allergies.    Review of Systems   Review of Systems  Constitutional:  Negative for chills and fever.  Respiratory:  Negative for cough and shortness of breath.   Cardiovascular:  Negative for chest pain.  Gastrointestinal:  Positive for abdominal pain and nausea.  Negative for blood in stool, constipation, diarrhea and vomiting.  Genitourinary:  Negative for dysuria and hematuria.  SEE HPI  Physical Exam Updated Vital Signs BP (!) 214/95   Pulse 64   Temp 98.2 F (36.8 C) (Oral)   Resp (!) 22   Ht 6' (1.829 m)   Wt 74.8 kg   SpO2 100%   BMI 22.38 kg/m  Physical Exam Vitals and nursing note reviewed.  Constitutional:      General: He is not in acute distress.    Appearance: He is not ill-appearing or toxic-appearing.  HENT:     Mouth/Throat:     Mouth: Mucous membranes are moist.  Eyes:     General: No scleral icterus. Cardiovascular:     Rate and Rhythm: Normal rate.     Pulses:          Radial pulses are 2+ on the right side and 2+ on the left side.       Dorsalis pedis pulses are 2+ on the right side and 2+ on the left side.       Posterior tibial pulses are 2+ on the right side and 2+ on the left side.  Pulmonary:     Effort: Pulmonary effort is normal. No respiratory distress.  Abdominal:     General: Bowel sounds are normal. There is no distension.     Palpations: Abdomen is soft.     Tenderness: There is generalized abdominal tenderness.  Skin:    General: Skin is warm and dry.  Neurological:     Mental Status: He is alert.     ED Results / Procedures / Treatments   Labs (all labs ordered are listed, but only abnormal results are displayed) Labs Reviewed  LIPASE, BLOOD - Abnormal; Notable for the following components:      Result Value   Lipase 52 (*)    All other components within normal limits  COMPREHENSIVE METABOLIC PANEL WITH GFR - Abnormal; Notable for the following components:   CO2 18 (*)    BUN 24 (*)    Creatinine, Ser 2.28 (*)    GFR, Estimated 31 (*)    All other components within normal limits  CBC - Abnormal; Notable for the following components:   WBC 13.0 (*)    RBC 3.96 (*)    Hemoglobin 12.7 (*)    HCT 37.0 (*)    All other components within normal limits  URINALYSIS, ROUTINE W REFLEX  MICROSCOPIC - Abnormal; Notable for the following components:   Protein, ur 100 (*)    Bacteria, UA RARE (*)    All other components within normal limits  TROPONIN I (HIGH SENSITIVITY) - Abnormal; Notable for the following components:   Troponin I (High Sensitivity) 33 (*)    All other components within normal limits  TROPONIN I (  HIGH SENSITIVITY) - Abnormal; Notable for the following components:   Troponin I (High Sensitivity) 27 (*)    All other components within normal limits  URINE CULTURE    EKG EKG Interpretation Date/Time:  Tuesday May 15 2023 15:21:45 EDT Ventricular Rate:  70 PR Interval:  219 QRS Duration:  84 QT Interval:  410 QTC Calculation: 443 R Axis:   67  Text Interpretation: Sinus rhythm Borderline prolonged PR interval Probable LVH with secondary repol abnrm Minimal ST elevation, lateral leads No significant change since last tracing Confirmed by Gwyneth Sprout ([id number]) on 05/15/2023 3:51:39 PM  Radiology CT ABDOMEN PELVIS W CONTRAST Result Date: 05/15/2023 CLINICAL DATA:  Abdominal pain, acute, nonlocalized h/o kidney cancer, was having central abdominal pain radiating to the right. * Tracking Code: BO * EXAM: CT ABDOMEN AND PELVIS WITH CONTRAST TECHNIQUE: Multidetector CT imaging of the abdomen and pelvis was performed using the standard protocol following bolus administration of intravenous contrast. RADIATION DOSE REDUCTION: This exam was performed according to the departmental dose-optimization program which includes automated exposure control, adjustment of the mA and/or kV according to patient size and/or use of iterative reconstruction technique. CONTRAST:  75mL OMNIPAQUE IOHEXOL 350 MG/ML SOLN COMPARISON:  PET-CT scan from 09/21/2022 and CT scan abdomen and pelvis from 07/14/2021. FINDINGS: Lower chest: The lung bases are clear. No pleural effusion. The heart is normal in size. No pericardial effusion. Hepatobiliary: The liver is normal in size. Non-cirrhotic  configuration. No suspicious mass. These is mild diffuse hepatic steatosis. No intrahepatic or extrahepatic bile duct dilation. No calcified gallstones. Normal gallbladder wall thickness. No pericholecystic inflammatory changes. Pancreas: Unremarkable. No pancreatic ductal dilatation or surrounding inflammatory changes. Spleen: Within normal limits. No focal lesion. Adrenals/Urinary Tract: Adrenal glands are unremarkable. There is a 9 x 11 mm partially exophytic lesion arising from the left kidney lower pole, laterally, which is favored to represent a proteinaceous/hemorrhagic cyst, unchanged since the prior study from 07/14/2021. There is a smaller simple cyst in the right kidney lower pole. Post cryoablation focal hyperattenuating area/posttreatment changes noted in the right kidney interpolar region, posteriorly. No discrete hyperattenuating areas seen to suggest local recurrence. No nephroureterolithiasis or obstructive uropathy on either side. Unremarkable urinary bladder. Stomach/Bowel: No disproportionate dilation of the small or large bowel loops. No evidence of abnormal bowel wall thickening or inflammatory changes. The appendix is unremarkable. Vascular/Lymphatic: No ascites or pneumoperitoneum. No abdominal or pelvic lymphadenopathy, by size criteria. No aneurysmal dilation of the major abdominal arteries. There are mild peripheral atherosclerotic vascular calcifications of the aorta and its major branches. Reproductive: Normal size prostate. Symmetric seminal vesicles. Other: There is a small fat containing periumbilical hernia. The soft tissues and abdominal wall are otherwise unremarkable. Musculoskeletal: No suspicious osseous lesions. There are mild multilevel degenerative changes in the visualized spine. IMPRESSION: 1. No acute inflammatory process identified within the abdomen or pelvis. No nephroureterolithiasis or obstructive uropathy. 2. Posttreatment changes noted in the right kidney interpolar  region, posteriorly. No local recurrent or test metastatic disease identified within the abdomen or pelvis. 3. Multiple other nonacute observations, as described above. 4. Aortic atherosclerosis. Electronically Signed   By: Jules Schick M.D.   On: 05/15/2023 18:33    Procedures Procedures   Medications Ordered in ED Medications  lactated ringers bolus 1,000 mL (has no administration in time range)  amLODipine (NORVASC) tablet 10 mg (has no administration in time range)  isosorbide mononitrate (IMDUR) 24 hr tablet 60 mg (has no administration in time range)  pantoprazole (PROTONIX)  injection 40 mg (has no administration in time range)  lactated ringers bolus 1,000 mL (0 mLs Intravenous Stopped 05/15/23 1953)  morphine (PF) 4 MG/ML injection 4 mg (4 mg Intravenous Given 05/15/23 1700)  iohexol (OMNIPAQUE) 350 MG/ML injection 75 mL (75 mLs Intravenous Contrast Given 05/15/23 1751)  morphine (PF) 4 MG/ML injection 4 mg (4 mg Intravenous Given 05/15/23 2121)    ED Course/ Medical Decision Making/ A&P    Medical Decision Making Amount and/or Complexity of Data Reviewed Labs: ordered. Radiology: ordered.  Risk Prescription drug management.   68 y.o. male presents to the ER for evaluation of abdominal pain. Differential diagnosis includes but is not limited to AAA, mesenteric ischemia, appendicitis, diverticulitis, DKA, gastroenteritis, nephrolithiasis, pancreatitis, constipation, UTI, bowel obstruction, biliary disease, IBD, PUD, hepatitis, testicular torsion. Vital signs elevated BP, otherwise unremarkable. Physical exam as noted above.   Patient does not appear in any acute distress and is standing fluffing blankets on his stretcher. The patient did have a colonoscopy per patient 3-4 weeks ago. Does seem to be to far out for possible complication. Will order CT and labs. Pain medication and fluids ordered as well as protonix.   I independently reviewed and interpreted the patient's labs. CBC  does show slight leukocytosis at 13.0 that is non specific. Hemoglobin at 12.7 which appears to be around baseline. Urinalysis shows 100 protein and rare bacteria, otherwise unremarkable. CMP shows bicarb at 18, BUN at 24, Cr 2.28, appears to be around his baseline as the patient does fluctuate. No other electrolyte or LFT abnormality noted. Lipase minimally elevated at 52. Troponin at 33 with repeat at 27. Appears to be around patient's baseline.   CT imaging shows  1. No acute inflammatory process identified within the abdomen or pelvis. No nephroureterolithiasis or obstructive uropathy. 2. Posttreatment changes noted in the right kidney interpolar region, posteriorly. No local recurrent or test metastatic disease identified within the abdomen or pelvis. 3. Multiple other nonacute observations, as described above. 4. Aortic atherosclerosis. Per radiologist's interpretation.    The patient does not have any acute findings in the abdomen. We discussed the cyst that was seen on the left kidney in the read report as well. Recommends observation with PCP/kidney specialist given his history. The patient had spicy [place]aj[place] food this weekend. Considered that this may be what is causing his abdominal discomfort and some burning with defecation. He is not having any black of bloody stools. His pain is controlled with the morphine. He was given some fluids given his Cr level and contrast given. He did not take any of his BP medications today. He reports that this BP is baseline for him. He does not want to be given the rest of his BP medication and would like to be discharged. Work up negative for any emergent process. He has palpable distal pulses that are symmetric, I have a very low suspicion for dissection/ aneurysm. Likely having some gastroenteritis from his last meals. My attending has seen the patient and agrees with discharge home with protonix and carafate. Stable for discharge home.   We discussed the results  of the labs/imaging. The plan is take medications as prescribed and follow up as needed. We discussed strict return precautions and red flag symptoms. The patient verbalized their understanding and agrees to the plan. The patient is stable and being discharged home in good condition.  I discussed this case with my attending physician who cosigned this note including patient's presenting symptoms, physical exam, and planned diagnostics  and interventions. Attending physician stated agreement with plan or made changes to plan which were implemented.   Attending physician assessed patient at bedside.  Portions of this report may have been transcribed using voice recognition software. Every effort was made to ensure accuracy; however, inadvertent computerized transcription errors may be present.    Final Clinical Impression(s) / ED Diagnoses Final diagnoses:  Generalized abdominal pain    Rx / DC Orders ED Discharge Orders          Ordered    pantoprazole (PROTONIX) 20 MG tablet  Daily        05/15/23 2151    sucralfate (CARAFATE) 1 g tablet  3 times daily with meals & bedtime        05/15/23 2151              Achille Rich, PA-C 05/18/23 1736    Tegeler, Canary Brim, MD 05/21/23 [phone]

## 2023-05-15 NOTE — ED Notes (Signed)
 Pts daugher will come get him

## 2023-05-15 NOTE — ED Provider Notes (Signed)
 [facility]    CSN: [id number] Arrival date & time: 05/15/23  1158      History   Chief Complaint Chief Complaint  Patient presents with   Abdominal Pain   Rectal Pain    HPI Kevin [name] is a 68 y.o. male.  Abdominal pain woke him from sleep around 4 AM Persisting since then, currently rated 8/10 Pain in the entire abdomen, worse periumbilical.  He is also reporting rectal pain. Last BM was yesterday, reports it was very small.  Denies any blood in stools or bright red blood when wiping.  He has been able to pass gas without issue.  Not having nausea; does report dry heaving this morning No fever. No urinary symptoms   3 weeks ago had colonoscopy   Past Medical History:  Diagnosis Date   AIDS (acquired immune deficiency syndrome) (HCC) 08/17/2016   Amaurosis fugax 08/18/2022   Chronic diastolic CHF (congestive heart failure), NYHA class 3 (HCC) 01/2016   Chronic lower back pain    CKD (chronic kidney disease), stage III (HCC)    COPD (chronic obstructive pulmonary disease) (HCC)    Gout    "forearms, hands, ankles, feet" (06/05/2016)   Headache    "weekly" (06/05/2016)   Heart murmur    Hypertension    Hypertensive crisis 08/15/2017   Lipoma 07/06/2022   OSA on CPAP    PAD (peripheral artery disease) (HCC)    PAF (paroxysmal atrial fibrillation) (HCC) 01/2016   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    Patient Active Problem List   Diagnosis Date Noted   Left renal mass 04/03/2023   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   COPD with acute exacerbation (HCC) 03/19/2023   COPD exacerbation (HCC) 12/21/2022   Anxiety 12/19/2022   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   Skin nodule 08/08/2022   Lung nodule, multiple 07/26/2022   Lipoma 07/06/2022   Chronic low back pain 04/29/2022   Chronic heart failure with preserved ejection fraction (HFpEF) (HCC) 12/27/2021   HLD (hyperlipidemia) 12/27/2021   Elevated troponin  12/27/2021   Right renal mass 06/29/2021   Renal lesion 06/29/2021   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   Acute respiratory failure with hypoxia (HCC)    Callus of foot 07/10/2018   Hypertensive urgency 08/15/2017   CAD (coronary artery disease) 10/19/2016   Easy bruising 08/11/2016   Claudication in peripheral vascular disease (HCC) 06/05/2016   CKD stage 3b, GFR 30-44 ml/min (HCC) - baseline SCr 1.8-2.2    Peripheral arterial disease (HCC) 05/09/2016   OSA (obstructive sleep apnea) 03/24/2016   Essential hypertension 02/18/2016   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   Blurry vision, bilateral    HIV (human immunodeficiency virus infection) (HCC) 08/27/2004    Past Surgical History:  Procedure Laterality Date   COLONOSCOPY N/A 04/24/2023   Procedure: COLONOSCOPY;  Surgeon: Vida Rigger, MD;  Location: [facility];  Service: Gastroenterology;  Laterality: N/A;   IR RADIOLOGIST EVAL & MGMT  05/31/2021   IR RADIOLOGIST EVAL & MGMT  07/26/2021   LEFT HEART CATH AND CORONARY ANGIOGRAPHY N/A 10/23/2016   Procedure: LEFT HEART CATH AND CORONARY ANGIOGRAPHY;  Surgeon: Marykay Lex, MD;  Location: [facility] INVASIVE CV LAB;  Service: Cardiovascular;  Laterality: N/A;   LOWER EXTREMITY ANGIOGRAPHY N/A 07/17/2016   Procedure: Lower Extremity Angiography;  Surgeon: Runell Gess, MD;  Location: [facility] INVASIVE CV LAB;  Service: Cardiovascular;  Laterality: N/A;   LOWER EXTREMITY INTERVENTION N/A 06/05/2016   Procedure: Lower Extremity Intervention;  Surgeon: Runell Gess, MD;  Location: [facility] INVASIVE CV LAB;  Service: Cardiovascular;  Laterality: N/A;   PERIPHERAL VASCULAR ATHERECTOMY Right 07/17/2016   Procedure:  Peripheral Vascular Atherectomy;  Surgeon: Runell Gess, MD;  Location: [facility] INVASIVE CV LAB;  Service: Cardiovascular;  Laterality: Right;  SFA   PERIPHERAL VASCULAR INTERVENTION  06/05/2016   Procedure: Peripheral Vascular Intervention;  Surgeon: Runell Gess, MD;  Location: [facility] INVASIVE CV LAB;  Service: Cardiovascular;;  left SFA   POLYPECTOMY  04/24/2023   Procedure: POLYPECTOMY;  Surgeon: Vida Rigger, MD;  Location: [facility];  Service: Gastroenterology;;   RADIOLOGY WITH ANESTHESIA Right 06/29/2021   Procedure: RIGHT RENAL CRYOABLATION;  Surgeon: Richarda Overlie, MD;  Location: [facility];  Service: Anesthesiology;  Laterality: Right;       Home Medications    Prior to Admission medications   Medication Sig Start Date End Date Taking? Authorizing Provider  albuterol (VENTOLIN HFA) 108 (90 Base) MCG/ACT inhaler Inhale 1-2 puffs into the lungs every 6 (six) hours as needed for wheezing or shortness of breath. 03/14/23  Yes Bevelyn Ngo, NP  amLODipine-valsartan (EXFORGE) 10-320 MG tablet Take 1 tablet by mouth daily. 04/19/23  Yes Marjie Skiff E, PA-C  aspirin EC (ASPIRIN LOW DOSE) 81 MG tablet Take 1 tablet (81 mg total) by mouth daily, swallow whole 05/07/23  Yes [name], MD  atorvastatin (LIPITOR) 80 MG tablet Take 1 tablet (80 mg total) by mouth daily. 02/08/23  Yes Alver Sorrow, NP  busPIRone (BUSPAR) 7.5 MG tablet Take 1 tablet (7.5 mg total) by mouth 2 (two) times daily. 03/12/23 06/13/23 Yes [name], MD  dolutegravir-lamiVUDine (DOVATO) 50-300 MG tablet Take 1 tablet by mouth daily. 07/06/22  Yes [name], [name], MD  Fluticasone-Umeclidin-Vilant (TRELEGY ELLIPTA) 200-62.5-25 MCG/ACT AEPB Inhale 1 puff into the lungs daily. 09/08/22  Yes   ipratropium-albuterol (DUONEB) 0.5-2.5 (3) MG/3ML SOLN Take 3 mLs by nebulization every 6 (six) hours as needed. Patient taking differently: Take 3 mLs by nebulization every 6 (six) hours as needed (for wheezing and  shortness of breath). 02/27/23  Yes Young, Joni Fears D, MD  isosorbide mononitrate (IMDUR) 60 MG 24 hr tablet Take 1 tablet (60 mg total) by mouth daily. 02/08/23 06/21/23 Yes Alver Sorrow, NP  montelukast (SINGULAIR) 10 MG tablet Take 1 tablet (10 mg total) by mouth at bedtime. 02/15/23  Yes Cobb, Ruby Cola, NP  nebivolol (BYSTOLIC) 5 MG tablet Take 1 tablet (5 mg total) by mouth daily. 02/08/23  Yes Alver Sorrow, NP    Family History Family History  Problem Relation Age of Onset   High blood pressure Mother    Lupus Mother    Seizures Daughter    Heart attack Daughter     Social History Social History   Tobacco Use   Smoking status: Every Day    Current packs/day: 0.10    Average packs/day: 0.1 packs/day for 42.0 years (4.2 ttl pk-yrs)    Types: Cigarettes, E-cigarettes    Passive exposure: Never   Smokeless tobacco: Never   Tobacco comments:    1-2 cigarettes a day 09/29/22 [place]  Vaping Use   Vaping status: Every Day   Substances: Nicotine,  Flavoring  Substance Use Topics   Alcohol use: Not Currently    Alcohol/week: 2.0 standard drinks of alcohol    Types: 2 Cans of beer per week    Comment: rare   Drug use: Not Currently    Comment: rare     Allergies   Patient has no known allergies.   Review of Systems Review of Systems  Gastrointestinal:  Positive for abdominal pain.   Per HPI  Physical Exam Triage Vital Signs ED Triage Vitals  Encounter Vitals Group     BP 05/15/23 1211 (!) 159/109     Systolic BP Percentile --      Diastolic BP Percentile --      Pulse Rate 05/15/23 1211 84     Resp 05/15/23 1211 18     Temp 05/15/23 1211 98.2 F (36.8 C)     Temp Source 05/15/23 1211 Oral     SpO2 05/15/23 1211 (!) 79 %     Weight --      Height --      Head Circumference --      Peak Flow --      Pain Score 05/15/23 1210 8     Pain Loc --      Pain Education --      Exclude from Growth Chart --    No data found.  Updated Vital Signs BP (!)  159/109 (BP Location: Left Arm)   Pulse 84   Temp 98.2 F (36.8 C) (Oral)   Resp 18   SpO2 97%   Visual Acuity Right Eye Distance:   Left Eye Distance:   Bilateral Distance:    Right Eye Near:   Left Eye Near:    Bilateral Near:     Physical Exam Vitals and nursing note reviewed.  Constitutional:      Appearance: He is not diaphoretic.     Comments: Uncomfortable appearing   HENT:     Mouth/Throat:     Mouth: Mucous membranes are moist.     Pharynx: Oropharynx is clear.  Eyes:     General: No scleral icterus.    Conjunctiva/sclera: Conjunctivae normal.  Cardiovascular:     Rate and Rhythm: Normal rate and regular rhythm.     Heart sounds: Normal heart sounds.  Pulmonary:     Effort: Pulmonary effort is normal.     Breath sounds: Normal breath sounds.  Abdominal:     General: Bowel sounds are normal. There is no distension.     Palpations: Abdomen is soft. There is no mass.     Tenderness: There is abdominal tenderness. There is no guarding.     Comments: Generalized tenderness without guarding or rebound. +BS  Skin:    General: Skin is warm and dry.     Coloration: Skin is not jaundiced or pale.  Neurological:     Mental Status: He is alert and oriented to person, place, and time.     UC Treatments / Results  Labs (all labs ordered are listed, but only abnormal results are displayed) Labs Reviewed - No data to display  EKG  Radiology No results found.  Procedures Procedures (including critical care time)  Medications Ordered in UC Medications - No data to display  Initial Impression / Assessment and Plan / UC Course  I have reviewed the triage vital signs and the nursing notes.  Pertinent labs & imaging results that were available during my care of the patient were reviewed by me and considered  in my medical decision making (see chart for details).  Afebrile, overall stable vitals apart from hypertensive 159/109. Patient with 8/10 abdominal and  rectal pain that woke him from sleep about 6 hours ago. 3 weeks out from colonoscopy. Discussed with patient requires higher level of care, advanced imaging not available in the urgent care setting.  I have advised he be evaluated in the emergency department.  Patient daughter is comfortable transporting him to the ED via POV  Final Clinical Impressions(s) / UC Diagnoses   Final diagnoses:  Generalized abdominal pain   Discharge Instructions   None    ED Prescriptions   None    PDMP not reviewed this encounter.   Zakee Deerman, Lurena Joiner, [place] 05/15/23 1248

## 2023-05-15 NOTE — ED Notes (Signed)
 Patient transported to CT

## 2023-05-15 NOTE — ED Triage Notes (Signed)
 Pt states that he woke up at 4am with abdominal pain, he took some pepto. He is also having rectal pain and burning.

## 2023-05-15 NOTE — ED Notes (Signed)
 Patient is being discharged from the Urgent Care and sent to the Emergency Department via POC . Per [employer], PA, patient is in need of higher level of care due to abdominal pain. Patient is aware and verbalizes understanding of plan of care.  Vitals:   05/15/23 1211  BP: (!) 159/109  Pulse: 84  Resp: 18  Temp: 98.2 F (36.8 C)  SpO2: 97%

## 2023-05-15 NOTE — Telephone Encounter (Signed)
 Called patient to schedule 3 month f/u scan. A male answered and advised he is not feeling well today. Patient request call back later to schedule scan due 06/12/2023.

## 2023-05-15 NOTE — Discharge Instructions (Addendum)
 You were seen in the ER today for evaluation of your abdominal pain. Your CT does not show any new changes. They do see that you have a cyst on your left kidney that is unchanged from CT from 2023. Please make sure you follow up with this. Please take the medications as prescribed. Follow up with your GI provider and PCP. Also try a bland diet. If you have any concerns, new or worsening symptoms, please return to the ER for re-evaluation.   Contact a doctor if: Your belly pain changes or gets worse. You have very bad cramping or bloating in your belly. You vomit. Your pain gets worse with meals, after eating, or with certain foods. You have trouble pooping or have watery poop for more than 2-3 days. You are not hungry, or you lose weight without trying. You have signs of not getting enough fluid or water (dehydration). These may include: Dark pee, very little pee, or no pee. Cracked lips or dry mouth. Feeling sleepy or weak. You have pain when you pee or poop. Your belly pain wakes you up at night. You have blood in your pee. You have a fever. Get help right away if: You cannot stop vomiting. Your pain is only in one part of your belly, like on the right side. You have bloody or black poop, or poop that looks like tar. You have trouble breathing. You have chest pain. These symptoms may be an emergency. Get help right away. Call 911. Do not wait to see if the symptoms will go away. Do not drive yourself to the hospital.

## 2023-05-15 NOTE — ED Notes (Signed)
 This RN called main lab to ask about status of previously collected and sent troponin. Lab states specimen now in process.

## 2023-05-16 ENCOUNTER — Telehealth: Payer: Self-pay

## 2023-05-16 ENCOUNTER — Other Ambulatory Visit (HOSPITAL_COMMUNITY): Payer: Self-pay

## 2023-05-16 LAB — URINE CULTURE: Culture: <10000 — AB

## 2023-05-16 NOTE — Transitions of Care (Post Inpatient/ED Visit) (Unsigned)
   05/16/2023  Name: Kevin [name] MRN: [id number] DOB: [birth year]  Today's TOC FU Call Status: Today's TOC FU Call Status:: Unsuccessful Call (1st Attempt) TOC FU Call Complete Date: 05/16/23  Attempted to reach the patient regarding the most recent Inpatient/ED visit.  Follow Up Plan: Additional outreach attempts will be made to reach the patient to complete the Transitions of Care (Post Inpatient/ED visit) call.   Signature Karena Addison, LPN [facility] Nurse Health Advisor Direct Dial [phone]

## 2023-05-17 NOTE — Transitions of Care (Post Inpatient/ED Visit) (Signed)
 05/17/2023  Name: Kevin [name] MRN: [id number] DOB: 1955/02/28  Today's TOC FU Call Status: Today's TOC FU Call Status:: Successful TOC FU Call Completed TOC FU Call Complete Date: 05/17/23 Patient's Name and Date of Birth confirmed.  Transition Care Management Follow-up Telephone Call Date of Discharge: 05/15/23 Discharge Facility: [facility]) Type of Discharge: Emergency Department Reason for ED Visit: Other: (abd pain) How have you been since you were released from the hospital?: Better Any questions or concerns?: No  Items Reviewed: Did you receive and understand the discharge instructions provided?: Yes Medications obtained,verified, and reconciled?: Yes (Medications Reviewed) Any new allergies since your discharge?: No Dietary orders reviewed?: Yes Do you have support at home?: Yes People in Home: child(ren), adult  Medications Reviewed Today: Medications Reviewed Today     Reviewed by Karena Addison, LPN (Licensed Practical Nurse) on 05/17/23 at 1533  Med List Status: <None>   Medication Order Taking? Sig Documenting Provider Last Dose Status Informant  albuterol (VENTOLIN HFA) 108 (90 Base) MCG/ACT inhaler [id number] No Inhale 1-2 puffs into the lungs every 6 (six) hours as needed for wheezing or shortness of breath. Bevelyn Ngo, NP Unknown Active Pharmacy Records, Self  amLODipine-valsartan [facility]) 10-320 MG tablet [id number] No Take 1 tablet by mouth daily. Corrin Parker, PA-C 05/14/2023 Active   aspirin EC (ASPIRIN LOW DOSE) 81 MG tablet [id number] No Take 1 tablet (81 mg total) by mouth daily, swallow whole [name], MD 05/14/2023 Active   atorvastatin (LIPITOR) 80 MG tablet [id number] No Take 1 tablet (80 mg total) by mouth daily. Alver Sorrow, NP 05/14/2023 Active Pharmacy Records, Self  BISACODYL 5 MG EC tablet [id number]  Take by mouth as directed. [provider]  Active   busPIRone (BUSPAR) 7.5 MG tablet [id number] No Take 1  tablet (7.5 mg total) by mouth 2 (two) times daily. [name], MD 05/14/2023 Active Pharmacy Records, Self  dolutegravir-lamiVUDine (DOVATO) 50-300 MG tablet [id number] No Take 1 tablet by mouth daily. [name], [name], MD 05/14/2023 Active Self, Pharmacy Records  Fluticasone-Umeclidin-Vilant [facility] ELLIPTA) 200-62.5-25 MCG/ACT AEPB [id number] No Inhale 1 puff into the lungs daily.  05/14/2023 Active Pharmacy Records, Self           Med Note Littie Deeds, [name]   Tue Dec 05, 2022  3:42 PM)    ipratropium-albuterol (DUONEB) 0.5-2.5 (3) MG/3ML SOLN [id number] No Take 3 mLs by nebulization every 6 (six) hours as needed. Waymon Budge, MD Unknown Active Pharmacy Records, Self  isosorbide mononitrate (IMDUR) 60 MG 24 hr tablet [id number] No Take 1 tablet (60 mg total) by mouth daily. Alver Sorrow, NP 05/14/2023 Active Pharmacy Records, Self           Med Note (WHITE, [place] Mar 19, 2023 12:44 AM)    montelukast (SINGULAIR) 10 MG tablet [id number] No Take 1 tablet (10 mg total) by mouth at bedtime. [name], NP 05/14/2023 Active Pharmacy Records, Self  nebivolol (BYSTOLIC) 5 MG tablet [id number] No Take 1 tablet (5 mg total) by mouth daily. Alver Sorrow, NP 05/14/2023 Active Pharmacy Records, Self  pantoprazole (PROTONIX) 20 MG tablet [id number]  Take 1 tablet (20 mg total) by mouth daily. Achille Rich, PA-C  Active   polyethylene glycol-electrolytes (NULYTELY) 420 g solution [id number]  MIX AND DRINK AS DIRECTED [provider]  Active   sucralfate (CARAFATE) 1 g tablet [id number]  Take 1 tablet (1 g total) by mouth 4 (four)  times daily -  with meals and at bedtime for 14 days. Achille Rich, PA-C  Active             Home Care and Equipment/Supplies: Were Home Health Services Ordered?: NA Any new equipment or medical supplies ordered?: NA  Functional Questionnaire: Do you need assistance with bathing/showering or dressing?: No Do you need assistance with meal  preparation?: No Do you need assistance with eating?: No Do you have difficulty maintaining continence: No Do you need assistance with getting out of bed/getting out of a chair/moving?: No Do you have difficulty managing or taking your medications?: No  Follow up appointments reviewed: PCP Follow-up appointment confirmed?: NA Specialist Hospital Follow-up appointment confirmed?: NA Do you need transportation to your follow-up appointment?: No Do you understand care options if your condition(s) worsen?: Yes-patient verbalized understanding    SIGNATURE Karena Addison, LPN [facility] Nurse Health Advisor Direct Dial [phone]

## 2023-05-21 NOTE — Telephone Encounter (Signed)
 Pt is scheduled on 05/25/23 and they are aware of the appt.

## 2023-05-21 NOTE — Telephone Encounter (Signed)
 Patient needs diagnostic CT chest rescheduled.   PCCs please advise. Thanks!!

## 2023-05-22 DIAGNOSIS — Z79891 Long term (current) use of opiate analgesic: Secondary | ICD-10-CM | POA: Diagnosis not present

## 2023-05-22 DIAGNOSIS — M545 Low back pain, unspecified: Secondary | ICD-10-CM | POA: Diagnosis not present

## 2023-05-22 DIAGNOSIS — G8929 Other chronic pain: Secondary | ICD-10-CM | POA: Diagnosis not present

## 2023-05-22 DIAGNOSIS — M47816 Spondylosis without myelopathy or radiculopathy, lumbar region: Secondary | ICD-10-CM | POA: Diagnosis not present

## 2023-05-24 ENCOUNTER — Encounter (HOSPITAL_BASED_OUTPATIENT_CLINIC_OR_DEPARTMENT_OTHER): Payer: Self-pay | Admitting: Family

## 2023-05-24 ENCOUNTER — Ambulatory Visit (HOSPITAL_BASED_OUTPATIENT_CLINIC_OR_DEPARTMENT_OTHER): Payer: 59 | Admitting: Family

## 2023-05-24 VITALS — BP 148/82 | HR 59 | Ht 72.0 in | Wt 162.8 lb

## 2023-05-24 DIAGNOSIS — I10 Essential (primary) hypertension: Secondary | ICD-10-CM | POA: Diagnosis not present

## 2023-05-24 DIAGNOSIS — Z720 Tobacco use: Secondary | ICD-10-CM | POA: Diagnosis not present

## 2023-05-24 DIAGNOSIS — E785 Hyperlipidemia, unspecified: Secondary | ICD-10-CM | POA: Diagnosis not present

## 2023-05-24 DIAGNOSIS — I25118 Atherosclerotic heart disease of native coronary artery with other forms of angina pectoris: Secondary | ICD-10-CM | POA: Diagnosis not present

## 2023-05-24 DIAGNOSIS — G4733 Obstructive sleep apnea (adult) (pediatric): Secondary | ICD-10-CM

## 2023-05-24 NOTE — Patient Instructions (Signed)
 Medication Instructions:  Your physician recommends that you continue on your current medications as directed. Please refer to the Current Medication list given to you today.  Testing/Procedures: Your physician has requested that you have a renal artery duplex. During this test, an ultrasound is used to evaluate blood flow to the kidneys. Allow one hour for this exam. Do not eat after midnight the day before and avoid carbonated beverages. Take your medications as you usually do.  Follow-Up: Please follow up in 2-3 months in [facility] with Dr. Duke Salvia, Gillian Shields, NP or Phillips Hay PharmD    Special Instructions:  We will check in on BP in 2 weeks   Please start using your noninvasive ventilator at night

## 2023-05-24 NOTE — Progress Notes (Signed)
 Advanced [facility]ension [facility] Assessment:    Date:  05/24/2023   ID:  Kevin [name], DOB [birth year], MRN [id number]  PCP:  [name], MD  Cardiologist:  Chrystie Nose, MD  Nephrologist:  Referring MD: [name], MD   CC: Hypertension  History of Present Illness:    Kevin [name] is a 68 y.o. male with a hx of HTN, HLD, COPD, OSA on CPAP, HIV, PAD s/p left SFA intervention 07/2016, CKDIIIb, CAD. Here to follow up in the [facility].   Follows with Dr. Rennis Golden for general cardiology. Previous cardiac workup includes: Echo 12/2021 LVEF 60-65%, moderate LVH, gr1DD, mildly elevated PASP, no significant valvular abnormalities. Carotid duplex 08/2022 mild nonobstructive 1-39% stenosis bilaterally. Myoview 09/26/22 no ischemia, LVEF 43%.   Established with [facility] 10/05/22. Kevin [name] was diagnosed with hypertension more than 10 years ago. It has been difficult to control particularly with compliance. Smoking 1-2 cigarettes per day. No formal exercise routine, not routinely following low sodium diet.   At initial visit 10/05/22 Amlodipine and valsartan were consolidated to Amlodipine-Valsartan 10-320mg  daily. Hdyralazine adjusted to 100mg  BID dosing for better adherence. BP noted to be nearly 50 points higher in L arm - had CT neck upcoming per neurology. Prior carotid duplex 08/2022 bilateral 1-39% stenosis. Was encouraged to resume using noninvasive ventilator at bedtime. Medications sent to [facility] for adherence packaging and free mail delivery.   Admitted 8/29-8/30/24 with COPD exacerbation treated with Prednisone and abx. Hydralazine was increased to 100mg  TID.   Saw general cardiology team and Lopressor was transitioned to Nebivolol 5mg  daily.  CT angio head/neck 10/25/22 mild soft plaque in thoracic aorta, no appreciable hemodynamically significant prox subclavian artery stenosis, mild plaque bilateral ICA but no significant stenosis,  patent vertebral arteries.   Seen 11/2022 with home BP 160s.  Wearing noninvasive ventilator at night. Med list clarified with [facility] outpatient pharmacy and Hydralazine 100mg  TID, Amlodipine-Valsartan 10-320mg  daily, Nebivolol 5mg  daily delivered.   Admitted 11/7-11/8/24 with COPD exacerbation. Repeat ED visit 01/04/23 with COPD exacerbation and herpes zoster.  At visit 02/08/23 Imdur 60mg  daily added for hypertension.   Admitted for COPD exacerbation 03/2023. Hydralazine was stopped, unclear why.  Presents today for follow up. He does have a headache today which often resolves with a BC powder. Blood pressure at home has been 150s when last checked. Reports breathing is okay as long he does not over-exert. He did not yet take his medication this morning. He has not had any water today, encouraged ot hydrate to help with headache. Sleep remains inconsistent often up late watching television and sleeping during the day. He has been picking up adherence packaging with cardiac medications including Amlodipine-Valsartan, Atorvastatin, Hydralazine, Imdur, Nebivolol 30-day supply last delivered 05/09/23.   Previous antihypertensives: Coreg - transitioned to cardioselective beta blocker  Past Medical History:  Diagnosis Date   AIDS (acquired immune deficiency syndrome) (HCC) 08/17/2016   Amaurosis fugax 08/18/2022   Chronic diastolic CHF (congestive heart failure), NYHA class 3 (HCC) 01/2016   Chronic lower back pain    CKD (chronic kidney disease), stage III (HCC)    COPD (chronic obstructive pulmonary disease) (HCC)    Gout    "forearms, hands, ankles, feet" (06/05/2016)   Headache    "weekly" (06/05/2016)   Heart murmur    Hypertension    Hypertensive crisis 08/15/2017   Lipoma 07/06/2022   OSA on CPAP    PAD (peripheral artery disease) (HCC)  PAF (paroxysmal atrial fibrillation) (HCC) 01/2016   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    Past Surgical History:  Procedure  Laterality Date   COLONOSCOPY N/A 04/24/2023   Procedure: COLONOSCOPY;  Surgeon: Vida Rigger, MD;  Location: [facility];  Service: Gastroenterology;  Laterality: N/A;   IR RADIOLOGIST EVAL & MGMT  05/31/2021   IR RADIOLOGIST EVAL & MGMT  07/26/2021   LEFT HEART CATH AND CORONARY ANGIOGRAPHY N/A 10/23/2016   Procedure: LEFT HEART CATH AND CORONARY ANGIOGRAPHY;  Surgeon: Marykay Lex, MD;  Location: [facility] INVASIVE CV LAB;  Service: Cardiovascular;  Laterality: N/A;   LOWER EXTREMITY ANGIOGRAPHY N/A 07/17/2016   Procedure: Lower Extremity Angiography;  Surgeon: Runell Gess, MD;  Location: [facility] INVASIVE CV LAB;  Service: Cardiovascular;  Laterality: N/A;   LOWER EXTREMITY INTERVENTION N/A 06/05/2016   Procedure: Lower Extremity Intervention;  Surgeon: Runell Gess, MD;  Location: [facility] INVASIVE CV LAB;  Service: Cardiovascular;  Laterality: N/A;   PERIPHERAL VASCULAR ATHERECTOMY Right 07/17/2016   Procedure: Peripheral Vascular Atherectomy;  Surgeon: Runell Gess, MD;  Location: [facility] INVASIVE CV LAB;  Service: Cardiovascular;  Laterality: Right;  SFA   PERIPHERAL VASCULAR INTERVENTION  06/05/2016   Procedure: Peripheral Vascular Intervention;  Surgeon: Runell Gess, MD;  Location: [facility] INVASIVE CV LAB;  Service: Cardiovascular;;  left SFA   POLYPECTOMY  04/24/2023   Procedure: POLYPECTOMY;  Surgeon: Vida Rigger, MD;  Location: [facility];  Service: Gastroenterology;;   RADIOLOGY WITH ANESTHESIA Right 06/29/2021   Procedure: RIGHT RENAL CRYOABLATION;  Surgeon: Richarda Overlie, MD;  Location: [facility];  Service: Anesthesiology;  Laterality: Right;    Current Medications: No outpatient medications have been marked as taking for the 05/24/23 encounter (Appointment) with Alver Sorrow, NP.     Allergies:   Patient has no known allergies.   Social History   Socioeconomic History   Marital status: Significant Other    Spouse name: Not on file   Number of children: Not on file   Years of  education: Not on file   Highest education level: Not on file  Occupational History   Not on file  Tobacco Use   Smoking status: Every Day    Current packs/day: 0.10    Average packs/day: 0.1 packs/day for 42.0 years (4.2 ttl pk-yrs)    Types: Cigarettes, E-cigarettes    Passive exposure: Never   Smokeless tobacco: Never   Tobacco comments:    1-2 cigarettes a day 09/29/22 [place]  Vaping Use   Vaping status: Every Day   Substances: Nicotine, Flavoring  Substance and Sexual Activity   Alcohol use: Not Currently    Alcohol/week: 2.0 standard drinks of alcohol    Types: 2 Cans of beer per week    Comment: rare   Drug use: Not Currently    Comment: rare   Sexual activity: Not Currently    Comment: declined condoms  Other Topics Concern   Not on file  Social History Narrative   Right handed   Social Drivers of Health   Financial Resource Strain: High Risk (10/05/2022)   Overall Financial Resource Strain (CARDIA)    Difficulty of Paying Living Expenses: Hard  Food Insecurity: No Food Insecurity (03/19/2023)   Hunger Vital Sign    Worried About Running Out of Food in the Last Year: Never true    Ran Out of Food in the Last Year: Never true  Transportation Needs: No Transportation Needs (03/19/2023)   PRAPARE - Transportation  Lack of Transportation (Medical): No    Lack of Transportation (Non-Medical): No  Physical Activity: Inactive (10/05/2022)   Exercise Vital Sign    Days of Exercise per Week: 0 days    Minutes of Exercise per Session: 0 min  Stress: Stress Concern Present (05/04/2022)   [employer] of Occupational Health - Occupational Stress Questionnaire    Feeling of Stress : To some extent  Social Connections: Moderately Isolated (03/19/2023)   Social Connection and Isolation Panel [NHANES]    Frequency of Communication with Friends and Family: More than three times a week    Frequency of Social Gatherings with Friends and Family: Never    Attends Religious  Services: Never    Database administrator or Organizations: No    Attends Engineer, structural: Never    Marital Status: Married     Family History: The patient's family history includes Heart attack in his daughter; High blood pressure in his mother; Lupus in his mother; Seizures in his daughter.  ROS:   Please see the history of present illness.    All other systems reviewed and are negative.  EKGs/Labs/Other Studies Reviewed:         Recent Labs: 03/18/2023: B Natriuretic Peptide 39.9 05/15/2023: ALT 12; BUN 24; Creatinine, Ser 2.28; Hemoglobin 12.7; Platelets 335; Potassium 4.6; Sodium 139   Recent Lipid Panel    Component Value Date/Time   CHOL 190 10/24/2022 1432   TRIG 79 10/24/2022 1432   HDL 79 10/24/2022 1432   CHOLHDL 2.4 10/24/2022 1432   CHOLHDL 3.5 07/06/2022 1145   VLDL 41 (H) 04/04/2019 0458   LDLCALC 97 10/24/2022 1432   LDLCALC 110 (H) 07/06/2022 1145    Physical Exam:   VS:  There were no vitals taken for this visit. , BMI There is no height or weight on file to calculate BMI.  There were no vitals filed for this visit.   GENERAL:  Well appearing HEENT: Pupils equal round and reactive, fundi not visualized, oral mucosa unremarkable NECK:  No jugular venous distention, waveform within normal limits, carotid upstroke brisk and symmetric, no bruits, no thyromegaly LYMPHATICS:  No cervical adenopathy LUNGS:  Clear to auscultation bilaterally HEART:  RRR.  PMI not displaced or sustained,S1 and S2 within normal limits, no S3, no S4, no clicks, no rubs, no murmurs ABD:  Flat, positive bowel sounds normal in frequency in pitch, no bruits, no rebound, no guarding, no midline pulsatile mass, no hepatomegaly, no splenomegaly EXT:  2 plus pulses throughout, no edema, no cyanosis no clubbing SKIN:  No rashes no nodules NEURO:  Cranial nerves II through XII grossly intact, motor grossly intact throughout PSYCH:  Cognitively intact, oriented to person place  and time   ASSESSMENT/PLAN:    HTN / Medication management - Antihypertensive regimen limited by renal function. Would avoid diuretic or MRA without nephrology input. Continue to utilize [facility] for adherence packaging and delivery.  Continue Amlodipine-Valsartan 10-320mg  daily, Nebivolol 5mg  daily, Imdur 60mg  daily.  Hesitant to further increase Imdur due to twice weekly headache. Hesitant to increase Nebivolol due to bradycardia. Plan for renal duplex  to rule out new RAS. Prior renal duplex 04/2016 with no stenosis however BP remains persistently difficult to control Difficult to change medication regimen today as checking BP very inconsistently at home and did not yet take his medications today. Continue present regimen. Resume using noninvasive ventilator. Taking medications on a schedule encouraged.  Discussed to monitor BP at home  at least 2 hours after medications and sitting for 5-10 minutes. Check in via MyChart in 2 weeks. If BP persistently high, consider resuming Hydralazine.   OSA - Encouraged to resume using noninvasive ventilator.  Discussed role in hypertension management.   COPD - Follows with PCP and pulmonology. No present exacerbation.  CKD - Established with Dr. Elenore Rota of nephrology.  Careful titration of diuretic and antihypertensive.    CAD / HLD - Stable with no anginal symptoms. Per general cardiology team.  Tobacco use - Smoking cessation encouraged. Recommend utilization of [id number].   PAD - No new claudication. Follows with [facility].   Screening for Secondary Hypertension:     10/10/2022    9:11 PM  Causes  Renovascular HTN Screened     - Comments 04/2016 renal duplex with no stenosis  Sleep Apnea Screened     - Comments 09/2022 recommended to resume use of nonivasive ventilator  Thyroid Disease Screened     - Comments 04/2022 normal TSH  Cushing's Syndrome N/A     - Comments non cushingoid appearance    Relevant Labs/Studies:    Latest Ref  Rng & Units 05/15/2023    1:29 PM 03/19/2023    4:37 AM 03/18/2023    9:34 PM  Basic Labs  Sodium 135 - 145 mmol/L 139  139  144   Potassium 3.5 - 5.1 mmol/L 4.6  4.5  4.7   Creatinine 0.61 - 1.24 mg/dL 1.91  4.78         Latest Ref Rng & Units 04/28/2022    3:32 PM 07/14/2021    8:41 AM  Thyroid   TSH 0.40 - 4.50 mIU/L 1.28  0.511                   Disposition:    FU with MD/PharmD/APP in 2-3 months.   Medication Adjustments/Labs and Tests Ordered: Current medicines are reviewed at length with the patient today.  Concerns regarding medicines are outlined above.  No orders of the defined types were placed in this encounter.  No orders of the defined types were placed in this encounter.    Signed, Alver Sorrow, NP  05/24/2023 1:08 PM    [facility]

## 2023-05-25 ENCOUNTER — Ambulatory Visit
Admission: RE | Admit: 2023-05-25 | Discharge: 2023-05-25 | Disposition: A | Discharge status: Home / Self Care | Source: Ambulatory Visit | Attending: Acute Care | Admitting: Acute Care

## 2023-05-25 DIAGNOSIS — R918 Other nonspecific abnormal finding of lung field: Secondary | ICD-10-CM | POA: Diagnosis not present

## 2023-05-25 DIAGNOSIS — I2510 Atherosclerotic heart disease of native coronary artery without angina pectoris: Secondary | ICD-10-CM | POA: Diagnosis not present

## 2023-05-25 DIAGNOSIS — I700 Atherosclerosis of aorta: Secondary | ICD-10-CM | POA: Diagnosis not present

## 2023-05-25 DIAGNOSIS — J439 Emphysema, unspecified: Secondary | ICD-10-CM | POA: Diagnosis not present

## 2023-05-29 ENCOUNTER — Other Ambulatory Visit: Payer: Self-pay

## 2023-05-30 ENCOUNTER — Other Ambulatory Visit: Payer: Self-pay | Admitting: Family Medicine

## 2023-05-30 ENCOUNTER — Other Ambulatory Visit: Payer: Self-pay

## 2023-05-30 ENCOUNTER — Other Ambulatory Visit (HOSPITAL_COMMUNITY): Payer: Self-pay

## 2023-05-30 DIAGNOSIS — F419 Anxiety disorder, unspecified: Secondary | ICD-10-CM

## 2023-05-30 MED ORDER — SUCRALFATE 1 G PO TABS
1.0000 g | ORAL_TABLET | Freq: Three times a day (TID) | ORAL | 0 refills | Status: DC
  Filled 2023-05-30 – 2023-05-31 (×3): qty 56, 14d supply, fill #0

## 2023-05-30 MED ORDER — PANTOPRAZOLE SODIUM 20 MG PO TBEC
20.0000 mg | DELAYED_RELEASE_TABLET | Freq: Every day | ORAL | 0 refills | Status: DC
  Filled 2023-05-30 – 2023-05-31 (×3): qty 14, 14d supply, fill #0

## 2023-05-30 MED ORDER — BUSPIRONE HCL 7.5 MG PO TABS
7.5000 mg | ORAL_TABLET | Freq: Two times a day (BID) | ORAL | 0 refills | Status: DC
  Filled 2023-05-30: qty 180, 90d supply, fill #0
  Filled 2023-05-31: qty 60, 30d supply, fill #0

## 2023-05-30 MED FILL — Montelukast Sodium Tab 10 MG (Base Equiv): ORAL | 30 days supply | Qty: 30 | Fill #4 | Status: CN

## 2023-05-31 ENCOUNTER — Other Ambulatory Visit: Payer: Self-pay

## 2023-05-31 ENCOUNTER — Other Ambulatory Visit (HOSPITAL_COMMUNITY): Payer: Self-pay

## 2023-05-31 MED FILL — Montelukast Sodium Tab 10 MG (Base Equiv): ORAL | 30 days supply | Qty: 30 | Fill #4 | Status: AC

## 2023-06-01 ENCOUNTER — Other Ambulatory Visit: Payer: Self-pay

## 2023-06-01 ENCOUNTER — Other Ambulatory Visit: Payer: Self-pay | Admitting: Pharmacy Technician

## 2023-06-01 NOTE — Progress Notes (Signed)
 Specialty Pharmacy Refill Coordination Note  Kevin [name] is a 68 y.o. male contacted today regarding refills of specialty medication(s) Dolutegravir -lamiVUDine  (Dovato )   Patient requested Delivery   Delivery date: 06/07/23   Verified address: [address]  [place]   Medication will be filled on 06/06/23.

## 2023-06-05 ENCOUNTER — Emergency Department (HOSPITAL_COMMUNITY)
Admission: EM | Admit: 2023-06-05 | Discharge: 2023-06-05 | Disposition: A | Discharge status: Home / Self Care | Attending: Emergency Medicine | Admitting: Emergency Medicine

## 2023-06-05 ENCOUNTER — Emergency Department (HOSPITAL_COMMUNITY)

## 2023-06-05 ENCOUNTER — Other Ambulatory Visit: Payer: Self-pay

## 2023-06-05 ENCOUNTER — Encounter (HOSPITAL_COMMUNITY): Payer: Self-pay

## 2023-06-05 DIAGNOSIS — J449 Chronic obstructive pulmonary disease, unspecified: Secondary | ICD-10-CM | POA: Diagnosis not present

## 2023-06-05 DIAGNOSIS — I110 Hypertensive heart disease with heart failure: Secondary | ICD-10-CM | POA: Insufficient documentation

## 2023-06-05 DIAGNOSIS — N189 Chronic kidney disease, unspecified: Secondary | ICD-10-CM | POA: Diagnosis not present

## 2023-06-05 DIAGNOSIS — Z7982 Long term (current) use of aspirin: Secondary | ICD-10-CM | POA: Diagnosis not present

## 2023-06-05 DIAGNOSIS — I10 Essential (primary) hypertension: Secondary | ICD-10-CM | POA: Diagnosis not present

## 2023-06-05 DIAGNOSIS — Z79899 Other long term (current) drug therapy: Secondary | ICD-10-CM | POA: Diagnosis not present

## 2023-06-05 DIAGNOSIS — R519 Headache, unspecified: Secondary | ICD-10-CM | POA: Diagnosis not present

## 2023-06-05 DIAGNOSIS — R079 Chest pain, unspecified: Secondary | ICD-10-CM | POA: Diagnosis not present

## 2023-06-05 DIAGNOSIS — I5032 Chronic diastolic (congestive) heart failure: Secondary | ICD-10-CM | POA: Diagnosis not present

## 2023-06-05 DIAGNOSIS — R0602 Shortness of breath: Secondary | ICD-10-CM | POA: Diagnosis not present

## 2023-06-05 LAB — CBC WITH DIFFERENTIAL/PLATELET
Abs Immature Granulocytes: 0.02 10*3/uL (ref 0.00–0.07)
Basophils Absolute: 0 10*3/uL (ref 0.0–0.1)
Basophils Relative: 0 %
Eosinophils Absolute: 0.9 10*3/uL — ABNORMAL HIGH (ref 0.0–0.5)
Eosinophils Relative: 12 %
HCT: 31 % — ABNORMAL LOW (ref 39.0–52.0)
Hemoglobin: 10.5 g/dL — ABNORMAL LOW (ref 13.0–17.0)
Immature Granulocytes: 0 %
Lymphocytes Relative: 20 %
Lymphs Abs: 1.5 10*3/uL (ref 0.7–4.0)
MCH: 32.5 pg (ref 26.0–34.0)
MCHC: 33.9 g/dL (ref 30.0–36.0)
MCV: 96 fL (ref 80.0–100.0)
Monocytes Absolute: 0.5 10*3/uL (ref 0.1–1.0)
Monocytes Relative: 7 %
Neutro Abs: 4.7 10*3/uL (ref 1.7–7.7)
Neutrophils Relative %: 61 %
Platelets: 263 10*3/uL (ref 150–400)
RBC: 3.23 MIL/uL — ABNORMAL LOW (ref 4.22–5.81)
RDW: 14.7 % (ref 11.5–15.5)
WBC: 7.7 10*3/uL (ref 4.0–10.5)
nRBC: 0 % (ref 0.0–0.2)

## 2023-06-05 LAB — COMPREHENSIVE METABOLIC PANEL WITH GFR
ALT: 10 U/L (ref 0–44)
AST: 17 U/L (ref 15–41)
Albumin: 3.7 g/dL (ref 3.5–5.0)
Alkaline Phosphatase: 67 U/L (ref 38–126)
Anion gap: 7 (ref 5–15)
BUN: 21 mg/dL (ref 8–23)
CO2: 23 mmol/L (ref 22–32)
Calcium: 8.6 mg/dL — ABNORMAL LOW (ref 8.9–10.3)
Chloride: 110 mmol/L (ref 98–111)
Creatinine, Ser: 2.27 mg/dL — ABNORMAL HIGH (ref 0.61–1.24)
GFR, Estimated: 31 mL/min — ABNORMAL LOW (ref >60)
Glucose, Bld: 108 mg/dL — ABNORMAL HIGH (ref 70–99)
Potassium: 4.3 mmol/L (ref 3.5–5.1)
Sodium: 140 mmol/L (ref 135–145)
Total Bilirubin: 0.6 mg/dL (ref 0.0–1.2)
Total Protein: 7.2 g/dL (ref 6.5–8.1)

## 2023-06-05 LAB — TROPONIN I (HIGH SENSITIVITY)
Troponin I (High Sensitivity): 21 ng/L — ABNORMAL HIGH (ref <18)
Troponin I (High Sensitivity): 20 ng/L — ABNORMAL HIGH (ref <18)

## 2023-06-05 MED ORDER — ASPIRIN 81 MG PO CHEW
324.0000 mg | CHEWABLE_TABLET | Freq: Once | ORAL | Status: AC
  Administered 2023-06-05: 324 mg via ORAL
  Filled 2023-06-05: qty 4

## 2023-06-05 MED ORDER — PROCHLORPERAZINE EDISYLATE 10 MG/2ML IJ SOLN
5.0000 mg | Freq: Once | INTRAMUSCULAR | Status: AC
  Administered 2023-06-05: 5 mg via INTRAVENOUS
  Filled 2023-06-05: qty 2

## 2023-06-05 MED ORDER — NITROGLYCERIN 0.4 MG SL SUBL: 0.4000 mg | SUBLINGUAL_TABLET | SUBLINGUAL | Status: DC | PRN

## 2023-06-05 MED ORDER — FENTANYL CITRATE PF 50 MCG/ML IJ SOSY
50.0000 ug | PREFILLED_SYRINGE | Freq: Once | INTRAMUSCULAR | Status: AC
  Administered 2023-06-05: 50 ug via INTRAVENOUS
  Filled 2023-06-05: qty 1

## 2023-06-05 NOTE — Discharge Instructions (Addendum)
 Follow-up with cardiology.  They may want to adjust her medicines if you are having headaches with it.  Your troponins were slightly elevated but stable.  Similar or decreased from what they were previously.

## 2023-06-05 NOTE — ED Provider Notes (Signed)
 [place] EMERGENCY DEPARTMENT AT [facility] Provider Note   CSN: [id number] Arrival date & time: 06/05/23  1427     History  Chief Complaint  Patient presents with   Shortness of Breath   Headache   Hypertension    Kevin [name] is a 68 y.o. male.   Shortness of Breath Associated symptoms: headaches   Headache Hypertension Associated symptoms include headaches and shortness of breath.  Patient presents with headache.  Shortness of breath.  Also hypertension.  States that headaches been coming and going.  States he does have some chest tightness.  States that he has been having headaches and thought was due to medicine so he had not been taking some his medicines.  States that headache went away.  States he then took the medicines again and the headache came back.  No numbness or weakness.  States the headache is over the front of his head.  No fevers.  No chills.  Does feel little short of breath.  States he did have a cough and then his abdomen hurt with the cough.  Had slight diarrhea.    Past Medical History:  Diagnosis Date   AIDS (acquired immune deficiency syndrome) (HCC) 08/17/2016   Amaurosis fugax 08/18/2022   Chronic diastolic CHF (congestive heart failure), NYHA class 3 (HCC) 01/2016   Chronic lower back pain    CKD (chronic kidney disease), stage III (HCC)    COPD (chronic obstructive pulmonary disease) (HCC)    Gout    "forearms, hands, ankles, feet" (06/05/2016)   Headache    "weekly" (06/05/2016)   Heart murmur    Hypertension    Hypertensive crisis 08/15/2017   Lipoma 07/06/2022   OSA on CPAP    PAD (peripheral artery disease) (HCC)    PAF (paroxysmal atrial fibrillation) (HCC) 01/2016   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   Past Surgical History:  Procedure Laterality Date   COLONOSCOPY N/A 04/24/2023   Procedure: COLONOSCOPY;  Surgeon: Ozell Blunt, MD;  Location: [facility]OSCOPY;  Service: Gastroenterology;  Laterality: N/A;    IR RADIOLOGIST EVAL & MGMT  05/31/2021   IR RADIOLOGIST EVAL & MGMT  07/26/2021   LEFT HEART CATH AND CORONARY ANGIOGRAPHY N/A 10/23/2016   Procedure: LEFT HEART CATH AND CORONARY ANGIOGRAPHY;  Surgeon: Arleen Lacer, MD;  Location: [facility] INVASIVE CV LAB;  Service: Cardiovascular;  Laterality: N/A;   LOWER EXTREMITY ANGIOGRAPHY N/A 07/17/2016   Procedure: Lower Extremity Angiography;  Surgeon: Avanell Leigh, MD;  Location: [facility] INVASIVE CV LAB;  Service: Cardiovascular;  Laterality: N/A;   LOWER EXTREMITY INTERVENTION N/A 06/05/2016   Procedure: Lower Extremity Intervention;  Surgeon: Avanell Leigh, MD;  Location: [facility] INVASIVE CV LAB;  Service: Cardiovascular;  Laterality: N/A;   PERIPHERAL VASCULAR ATHERECTOMY Right 07/17/2016   Procedure: Peripheral Vascular Atherectomy;  Surgeon: Avanell Leigh, MD;  Location: [facility] INVASIVE CV LAB;  Service: Cardiovascular;  Laterality: Right;  SFA   PERIPHERAL VASCULAR INTERVENTION  06/05/2016   Procedure: Peripheral Vascular Intervention;  Surgeon: Avanell Leigh, MD;  Location: [facility] INVASIVE CV LAB;  Service: Cardiovascular;;  left SFA   POLYPECTOMY  04/24/2023   Procedure: POLYPECTOMY;  Surgeon: Ozell Blunt, MD;  Location: [facility];  Service: Gastroenterology;;   RADIOLOGY WITH ANESTHESIA Right 06/29/2021   Procedure: RIGHT RENAL CRYOABLATION;  Surgeon: Elene Griffes, MD;  Location: [facility] ORS;  Service: Anesthesiology;  Laterality: Right;     Home Medications Prior to Admission medications   Medication Sig  Start Date End Date Taking? Authorizing Provider  albuterol  (VENTOLIN  HFA) 108 (90 Base) MCG/ACT inhaler Inhale 2-4 puffs into the lungs every 3 (three) hours as needed for wheezing or shortness of breath.   Yes [provider]  amLODipine -valsartan  (EXFORGE ) 10-320 MG tablet Take 1 tablet by mouth daily. 04/19/23  Yes Goodrich, Callie E, PA-C  aspirin  EC (ASPIRIN  LOW DOSE) 81 MG tablet Take 1 tablet (81 mg total) by mouth daily, swallow whole  05/07/23  Yes Catheryn Cluck, MD  Aspirin -Salicylamide-Caffeine (BC HEADACHE POWDER PO) Take 1 packet by mouth every 8 (eight) hours as needed (for headaches).   Yes [provider]  atorvastatin  (LIPITOR) 80 MG tablet Take 1 tablet (80 mg total) by mouth daily. 02/08/23  Yes Clearnce Curia, NP  busPIRone  (BUSPAR ) 7.5 MG tablet Take 1 tablet (7.5 mg total) by mouth 2 (two) times daily. Patient taking differently: Take 7.5 mg by mouth in the morning and at bedtime. 05/30/23 08/28/23 Yes Catheryn Cluck, MD  dolutegravir -lamiVUDine  (DOVATO ) 50-300 MG tablet Take 1 tablet by mouth daily. 07/06/22  Yes Ernie Heal, Jerelyn Money, MD  Fluticasone -Umeclidin-Vilant (TRELEGY ELLIPTA ) 200-62.5-25 MCG/ACT AEPB Inhale 1 puff into the lungs daily. 09/08/22  Yes   ipratropium-albuterol  (DUONEB) 0.5-2.5 (3) MG/3ML SOLN Take 3 mLs by nebulization every 6 (six) hours as needed. Patient taking differently: Take 3 mLs by nebulization every 6 (six) hours as needed (for shortness of breath or wheezing). 02/27/23  Yes Young, Rupert Counts D, MD  isosorbide  mononitrate (IMDUR ) 60 MG 24 hr tablet Take 1 tablet (60 mg total) by mouth daily. 02/08/23 07/21/23 Yes Clearnce Curia, NP  montelukast  (SINGULAIR ) 10 MG tablet Take 1 tablet (10 mg total) by mouth at bedtime. 02/15/23  Yes Cobb, Mariah Shines, NP  sucralfate  (CARAFATE ) 1 g tablet Take 1 tablet (1 g total) by mouth 4 (four) times daily -  with meals and at bedtime for 14 days. 05/30/23 06/14/23 Yes Catheryn Cluck, MD  albuterol  (VENTOLIN  HFA) 108 (90 Base) MCG/ACT inhaler Inhale 1-2 puffs into the lungs every 6 (six) hours as needed for wheezing or shortness of breath. Patient not taking: Reported on 06/05/2023 03/14/23   Raejean Bullock, NP  nebivolol  (BYSTOLIC ) 5 MG tablet Take 1 tablet (5 mg total) by mouth daily. Patient not taking: Reported on 06/05/2023 02/08/23   Clearnce Curia, NP  pantoprazole  (PROTONIX ) 20 MG tablet Take 1 tablet (20 mg total) by mouth  daily. Patient not taking: Reported on 06/05/2023 05/30/23   Catheryn Cluck, MD      Allergies    Patient has no known allergies.    Review of Systems   Review of Systems  Respiratory:  Positive for shortness of breath.   Neurological:  Positive for headaches.    Physical Exam Updated Vital Signs BP (!) 154/88 (BP Location: Right Arm)   Pulse 98   Temp 97.9 F (36.6 C) (Oral)   Resp 18   Ht 6' (1.829 m)   Wt 73 kg   SpO2 98%   BMI 21.83 kg/m  Physical Exam Vitals reviewed.  Constitutional:      Appearance: He is well-developed.  HENT:     Head: Atraumatic.  Cardiovascular:     Rate and Rhythm: Regular rhythm.  Pulmonary:     Breath sounds: No rhonchi or rales.  Chest:     Chest wall: No tenderness.  Abdominal:     Tenderness: There is no abdominal tenderness.  Skin:  General: Skin is warm.     Capillary Refill: Capillary refill takes less than 2 seconds.  Neurological:     Mental Status: He is alert.     ED Results / Procedures / Treatments   Labs (all labs ordered are listed, but only abnormal results are displayed) Labs Reviewed  CBC WITH DIFFERENTIAL/PLATELET - Abnormal; Notable for the following components:      Result Value   RBC 3.23 (*)    Hemoglobin 10.5 (*)    HCT 31.0 (*)    Eosinophils Absolute 0.9 (*)    All other components within normal limits  COMPREHENSIVE METABOLIC PANEL WITH GFR - Abnormal; Notable for the following components:   Glucose, Bld 108 (*)    Creatinine, Ser 2.27 (*)    Calcium  8.6 (*)    GFR, Estimated 31 (*)    All other components within normal limits  TROPONIN I (HIGH SENSITIVITY) - Abnormal; Notable for the following components:   Troponin I (High Sensitivity) 21 (*)    All other components within normal limits  TROPONIN I (HIGH SENSITIVITY) - Abnormal; Notable for the following components:   Troponin I (High Sensitivity) 20 (*)    All other components within normal limits    EKG EKG  Interpretation Date/Time:  Tuesday June 05 2023 15:00:20 EDT Ventricular Rate:  78 PR Interval:  184 QRS Duration:  85 QT Interval:  403 QTC Calculation: 459 R Axis:   58  Text Interpretation: Sinus rhythm LVH with secondary repolarization abnormality Probable anterior infarct, age indeterminate similar to May 15 2023 Confirmed by Jerilynn Montenegro [phone]) on 06/05/2023 3:17:47 PM  Radiology CT HEAD WO CONTRAST ( ) Result Date: 06/05/2023 EXAM: CT HEAD WITHOUT 06/05/2023 05:24:56 PM TECHNIQUE: CT of the head was performed without the administration of intravenous contrast. Automated exposure control, iterative reconstruction, and/or weight based adjustment of the mA/kV was utilized to reduce the radiation dose to as low as reasonably achievable. COMPARISON: CT Head dated 10/25/2022. CLINICAL HISTORY: Headache, new onset (Age 52y). C/O - Shortness of Breath; Headache; Hypertension. FINDINGS: BRAIN AND VENTRICLES: There is no acute intracranial hemorrhage, mass effect or midline shift. No abnormal extra-axial fluid collection. The gray-white differentiation is maintained without evidence of an acute infarct. There is mild scattered periventricular and subcortical low density, likely reflecting small vessel ischemic changes. There is no evidence of hydrocephalus. ORBITS: The visualized portion of the orbits demonstrate no acute abnormality. SINUSES: The visualized paranasal sinuses and mastoid air cells demonstrate no acute abnormality. SOFT TISSUES AND SKULL: No acute abnormality of the visualized skull or soft tissues. IMPRESSION: 1. No acute intracranial abnormality. Electronically signed by: Zadie Herter MD 06/05/2023 05:39 PM EDT RP Workstation: [id number]   DG Chest 2 View Result Date: 06/05/2023 CLINICAL DATA:  Chest pain, shortness of breath. EXAM: CHEST - 2 VIEW COMPARISON:  March 18, 2023. FINDINGS: The heart size and mediastinal contours are within normal limits. Both lungs are  clear. The visualized skeletal structures are unremarkable. IMPRESSION: No active cardiopulmonary disease. Electronically Signed   By: Rosalene Colon M.D.   On: 06/05/2023 15:45    Procedures Procedures    Medications Ordered in ED Medications  aspirin  chewable tablet 324 mg (324 mg Oral Given 06/05/23 1547)  prochlorperazine  (COMPAZINE ) injection 5 mg (5 mg Intravenous Given 06/05/23 1628)  fentaNYL  (SUBLIMAZE ) injection 50 mcg (50 mcg Intravenous Given 06/05/23 1924)    ED Course/ Medical Decision Making/ A&P  Medical Decision Making Amount and/or Complexity of Data Reviewed Radiology: ordered.  Risk Prescription drug management.   Patient with headache chest pain hypertension.  Differential diagnosis along with includes causes such as nonspecific headache, intracranial hemorrhage and even metastatic disease.  Has had previous lung nodules and previous renal cancer.  Although does have headache after taking medicines.  Potentially could be due to the Imdur  which has been known to do headaches.  Will get head CT.  Will get basic blood work and treat with some Compazine .  Troponin mildly elevated but actually less than it was with previous visit.  Head CT done and reassuring.  Feeling better after treatment.  Some shortness of breath.  Does not appear to be in CHF.  Head CT did not show intracranial mass or hemorrhage although was noncontrast.  Feeling better will discharge home for outpatient follow-up.        Final Clinical Impression(s) / ED Diagnoses Final diagnoses:  Bad headache  Hypertension, unspecified type    Rx / DC Orders ED Discharge Orders          Ordered    Ambulatory referral to Cardiology       Comments: If you have not heard from the Cardiology office within the next 72 hours please call [phone].   06/05/23 2032              Mozell Arias, MD 06/05/23 2317

## 2023-06-05 NOTE — ED Provider Triage Note (Signed)
 Emergency Medicine Provider Triage Evaluation Note  Kevin [name] , a 68 y.o. male  was evaluated in triage.  Pt complains of severe headache, chest heaviness and sobx4 days. Has progressively been getting worse. Has not been taking HTN meds.  Review of Systems  Positive: Chest pain, sob Negative: N/v  Physical Exam  BP (!) 150/105   Pulse 68   Temp 98.2 F (36.8 C) (Oral)   Resp 19   Ht 6' (1.829 m)   Wt 73 kg   SpO2 100%   BMI 21.83 kg/m  Gen:   Awake, no distress   Resp:  Normal effort  MSK:   Moves extremities without difficulty  Other:  No neurodeficits  Medical Decision Making  Medically screening exam initiated at 3:24 PM.  Appropriate orders placed.  Kevin [name] was informed that the remainder of the evaluation will be completed by another provider, this initial triage assessment does not replace that evaluation, and the importance of remaining in the ED until their evaluation is complete.  Appears to be ST depression on monitor, instructed RN to give next available room.    Kevin [name], [place] 06/05/23 1526

## 2023-06-05 NOTE — ED Triage Notes (Signed)
 Pt arrived reporting headache, shob, HTN. Hx of copd. States she does not wear 02 at home. Pt reports BP has been high but he stop taking his medication because he didn't know if the medication was contributing to his headache. No other symptoms reported

## 2023-06-06 ENCOUNTER — Encounter: Payer: Self-pay | Admitting: Acute Care

## 2023-06-06 ENCOUNTER — Ambulatory Visit: Admitting: Acute Care

## 2023-06-06 ENCOUNTER — Other Ambulatory Visit: Payer: Self-pay

## 2023-06-06 ENCOUNTER — Other Ambulatory Visit (HOSPITAL_COMMUNITY): Payer: Self-pay

## 2023-06-06 VITALS — BP 184/118 | HR 54 | Ht 73.0 in | Wt 162.6 lb

## 2023-06-06 DIAGNOSIS — R911 Solitary pulmonary nodule: Secondary | ICD-10-CM | POA: Diagnosis not present

## 2023-06-06 DIAGNOSIS — F17200 Nicotine dependence, unspecified, uncomplicated: Secondary | ICD-10-CM

## 2023-06-06 DIAGNOSIS — F17210 Nicotine dependence, cigarettes, uncomplicated: Secondary | ICD-10-CM | POA: Diagnosis not present

## 2023-06-06 DIAGNOSIS — R590 Localized enlarged lymph nodes: Secondary | ICD-10-CM | POA: Diagnosis not present

## 2023-06-06 DIAGNOSIS — I10 Essential (primary) hypertension: Secondary | ICD-10-CM | POA: Diagnosis not present

## 2023-06-06 DIAGNOSIS — J449 Chronic obstructive pulmonary disease, unspecified: Secondary | ICD-10-CM

## 2023-06-06 MED ORDER — TRELEGY ELLIPTA 200-62.5-25 MCG/ACT IN AEPB: 1.0000 | INHALATION_SPRAY | Freq: Every day | RESPIRATORY_TRACT | Status: DC

## 2023-06-06 NOTE — Progress Notes (Signed)
 History of Present Illness ESLEY CREGER is a  68 y.o. male current everyday smoker followed by Dr. [name] for a pulmonary nodule.   Synopsis 68 year old patient current everyday smoker with a history of tobacco use (40+ pk-yrs) and COPD, renal cell cancer, HIV on therapy, hypertension and atrial fibrillation with diastolic CHF, OSA on CPAP, gout.  He has been seen by Dr. Jenny Mohs in our office for his COPD and also pulmonary nodular disease on a CT chest 11/2020.  He is also followed by Dr. Dianne Fortune.    CT scan of the chest 09/06/2022 shows multiple stable right sided solid and subsolid pulmonary nodules that were stable or regressed.  2 nodules had increased in size including a 7 mm solid right upper lobe nodule, 15 mm mixed density right lower lobe nodule.  This prompted PET scan. He has been experiencing episodes of acute dyspnea, has been back and forth to the ED multiple times this year. Was started on Trelegy in June 2024. Dyspnea  occurs w exertion, chores or at rest. It Does not wake him from sleep. He coughs through the day, produces mucous in the am. ? Whether there may be a component of panic / anxiety. Albuterol  nebs 3x a day. Unclear triggers. Does have trouble in hot  and humid air.    PET scan 09/21/2022  showed mild hypermetabolism within the mediastinal nodes with SUV max 4.1 of unclear significance.  The right upper lobe peribronchovascular nodules measured at 5 mm.  None of the groundglass nodules had any hypermetabolism.  There was some nonspecific hypermetabolism in the right apex as well as inferior lingula and an area of some minimal airspace disease that was new compared with the CT done 7/24. Pt. Opted for careful monitoring,  and a 6 month follow up scan was scheduled for 02/2023. That scan showed stable nodules, so a 3 month scan was ordered for continued surveillance. He is here today to review the results of the most recent imaging.    Pt. Has consented to use of [employer] soft  wear to help capture the content of this OV   06/06/2023 JOHNLEE MATSUYAMA is a 68 year old male current every day smoker with a lung nodule and history of renal cancer who presents for follow-up and consideration of a biopsy.  He has a lung nodule in the right lower lobe, measuring 1.4 centimeters, with an increase in the solid component from 4 millimeters to 5 millimeters. Given his history of renal cancer, there is concern for malignancy.  We discussed continued watchful waiting, but he prefers to go ahead and move forward with navigational bronchoscopy with biopsies. We have reviewed risks and benefits of the procedure and he has agreed to move forward with scheduling the bronchoscopy with biopsies.  His renal cancer history includes surgical removal over a year ago, with no follow-up communication with the surgical team since the procedure.  He experiences difficulty sleeping due to not having his Trelegy inhaler, which he states is necessary to avoid emergency room visits. He has not received his Trelegy or albuterol  inhalers from the pharmacy despite having refills available, mentioning that the pharmacy's packaging system may be causing issues with receiving his medications.  He has a history of high blood pressure, with readings as high as 220/110 for the past 20 years. He experiences headaches, which he attributes to a heart medication prescribed by his cardiologist.He is not taking his blood pressure mediations or his Imdur  at present.  I had an extended conversation with patient about his blood pressure. He states it has been high for many years, and that he has been "blessed " that he has not had a stroke. I explained that he is at high risk for stroke, and that it is best not to believe that he will continue to have good luck and  avoid having a stroke. I have asked him to follow up with cardiology, and to take all medication as prescribed. If one medication is causing him headaches, he  needs to see if cardiology can to a therapeutic change. Aaron Aas   He is not currently using a CPAP machine for his diagnosed sleep apnea due to discomfort.  He takes a low-dose aspirin  daily but will need to withhold it before the scheduled navigational bronchoscopy with biopsies.   Test Results: CT Chest 05/25/2023  -Part solid nodule within the right lower lobe measures 1.4 cm with internal solid component measuring 5 mm, image 97/5. On the previous exam this measured 1.4 cm with solid component measuring 4 mm. No acute findings. 2. Stable appearance of 1.4 cm part solid nodule within the right lower lobe with internal solid component measuring 5 mm. Low-grade pulmonary neoplasm such as adenocarcinoma is not excluded. 3. Additional lung nodules are unchanged in the interval. 4. Coronary artery calcifications. 5. Aortic Atherosclerosis (ICD10-I70.0) and Emphysema (ICD10-J43.9).  CT chest 03/07/2023 Mediastinum/Nodes: Slightly patulous thoracic esophagus. Preserved thyroid  gland. On this non IV contrast exam there is no specific abnormal lymph node enlargement identified in the axillary regions, hilum or mediastinum on the prior examination there were some hypermetabolic nodes identified in the mediastinum and right lung hilum. Specific example of a node in the right lung hilum measured 7 mm in short axis on the prior examination and today 6 mm on series 2, image 68. Other nodes are similar as well.   Lungs/Pleura: No consolidation, pneumothorax or effusion. Some underlying chronic lung changes identified. The semi-solid nodular area in the right lower lobe which previously had an AP dimension of 15 mm, today when measured in the same fashion on series 8, image 93 measures 13 mm. This is not have increased uptake on PET. 7 mm right upper lobe more solid nodule on the previous examination today on series 8, image 39 measures 6 mm. Smaller ground-glass in the right lung on series 8, image  70 is stable. Subtle areas of nodularity centrally in the middle lobe on series 8 image 86 and 88 are stable. These are small. No new dominant lung nodules. The area of hypermetabolism at the right lung apex does not have a CT correlate today. Similar area in the lingula without a CT correlate.   Upper Abdomen: Along the upper abdomen the adrenal glands are preserved. There is focal atrophy along the posterior aspect of the right kidney. Please correlate with prior intervention or other process.   Musculoskeletal: Mild degenerative changes along the spine.   IMPRESSION: Multiple solid and semi solid lung nodules are unchanged in size compared to the prior CT scan. Recommend continued short-term surveillance.   Previously there were some hypermetabolic small mediastinal hilar nodes which are unchanged in size. Additional attention on follow-up.         Latest Ref Rng & Units 06/05/2023    3:59 PM 05/15/2023    1:29 PM 03/18/2023    9:34 PM  CBC  WBC 4.0 - 10.5 K/uL 7.7  13.0    Hemoglobin 13.0 - 17.0 g/dL 10.5  12.7  12.2   Hematocrit 39.0 - 52.0 % 31.0  37.0  36.0   Platelets 150 - 400 K/uL 263  335         Latest Ref Rng & Units 06/05/2023    3:59 PM 05/15/2023    1:29 PM 03/19/2023    4:37 AM  BMP  Glucose 70 - 99 mg/dL 161  72  096   BUN 8 - 23 mg/dL 21  24  15    Creatinine 0.61 - 1.24 mg/dL 0.45  4.09  8.11   Sodium 135 - 145 mmol/L 140  139  139   Potassium 3.5 - 5.1 mmol/L 4.3  4.6  4.5   Chloride 98 - 111 mmol/L 110  107  114   CO2 22 - 32 mmol/L 23  18  19    Calcium  8.9 - 10.3 mg/dL 8.6  8.9  8.5     BNP    Component Value Date/Time   BNP 39.9 03/18/2023 2355    ProBNP No results found for: "PROBNP"  PFT    Component Value Date/Time   FEV1PRE 1.59 11/24/2022 1551   FEV1POST 1.70 11/24/2022 1551   FVCPRE 3.52 11/24/2022 1551   FVCPOST 3.24 11/24/2022 1551   TLC 8.39 11/24/2022 1551   DLCOUNC 19.08 11/24/2022 1551   PREFEV1FVCRT 45 11/24/2022 1551    PSTFEV1FVCRT 53 11/24/2022 1551    CT CHEST WO CONTRAST Result Date: 06/06/2023 CLINICAL DATA:  Follow-up lung nodules. History of renal cell carcinoma status post ablation. EXAM: CT CHEST WITHOUT CONTRAST TECHNIQUE: Multidetector CT imaging of the chest was performed following the standard protocol without IV contrast. RADIATION DOSE REDUCTION: This exam was performed according to the departmental dose-optimization program which includes automated exposure control, adjustment of the mA and/or kV according to patient size and/or use of iterative reconstruction technique. COMPARISON:  03/07/2023 FINDINGS: Cardiovascular: Normal heart size. Aortic atherosclerosis. The ascending thoracic aorta measures 4 cm, image 88/6. No pericardial effusion. Coronary artery calcification. Mediastinum/Nodes: Thyroid  gland, trachea, and esophagus appear normal. No enlarged mediastinal or axillary lymph nodes. Lungs/Pleura: The central airways appear patent. There is no pleural fluid, interstitial edema or airspace disease. Emphysema with diffuse bronchial wall thickening. -Index nodules include: Right upper lobe lung nodule measures 5 mm, image 45/5. Stable in the interval. -Part solid nodule within the right lower lobe measures 1.4 cm with internal solid component measuring 5 mm, image 97/5. On the previous exam this measured 1.4 cm with solid component measuring 4 mm. -Unchanged ground-glass attenuating nodule along the proximal minor fissure, image 7/75. Unchanged. Upper Abdomen: No acute abnormality. Musculoskeletal: No chest wall mass or suspicious bone lesions identified. IMPRESSION: 1. No acute findings. 2. Stable appearance of 1.4 cm part solid nodule within the right lower lobe with internal solid component measuring 5 mm. Low-grade pulmonary neoplasm such as adenocarcinoma is not excluded. 3. Additional lung nodules are unchanged in the interval. 4. Coronary artery calcifications. 5. Aortic Atherosclerosis (ICD10-I70.0)  and Emphysema (ICD10-J43.9). Electronically Signed   By: Kimberley Penman M.D.   On: 06/06/2023 08:20   CT HEAD WO CONTRAST ( ) Result Date: 06/05/2023 EXAM: CT HEAD WITHOUT 06/05/2023 05:24:56 PM TECHNIQUE: CT of the head was performed without the administration of intravenous contrast. Automated exposure control, iterative reconstruction, and/or weight based adjustment of the mA/kV was utilized to reduce the radiation dose to as low as reasonably achievable. COMPARISON: CT Head dated 10/25/2022. CLINICAL HISTORY: Headache, new onset (Age 42y). C/O - Shortness of Breath; Headache; Hypertension. FINDINGS:  BRAIN AND VENTRICLES: There is no acute intracranial hemorrhage, mass effect or midline shift. No abnormal extra-axial fluid collection. The gray-white differentiation is maintained without evidence of an acute infarct. There is mild scattered periventricular and subcortical low density, likely reflecting small vessel ischemic changes. There is no evidence of hydrocephalus. ORBITS: The visualized portion of the orbits demonstrate no acute abnormality. SINUSES: The visualized paranasal sinuses and mastoid air cells demonstrate no acute abnormality. SOFT TISSUES AND SKULL: No acute abnormality of the visualized skull or soft tissues. IMPRESSION: 1. No acute intracranial abnormality. Electronically signed by: Zadie Herter MD 06/05/2023 05:39 PM EDT RP Workstation: [id number]   DG Chest 2 View Result Date: 06/05/2023 CLINICAL DATA:  Chest pain, shortness of breath. EXAM: CHEST - 2 VIEW COMPARISON:  March 18, 2023. FINDINGS: The heart size and mediastinal contours are within normal limits. Both lungs are clear. The visualized skeletal structures are unremarkable. IMPRESSION: No active cardiopulmonary disease. Electronically Signed   By: Rosalene Colon M.D.   On: 06/05/2023 15:45   CT ABDOMEN PELVIS W CONTRAST Result Date: 05/15/2023 CLINICAL DATA:  Abdominal pain, acute, nonlocalized h/o kidney cancer,  was having central abdominal pain radiating to the right. * Tracking Code: BO * EXAM: CT ABDOMEN AND PELVIS WITH CONTRAST TECHNIQUE: Multidetector CT imaging of the abdomen and pelvis was performed using the standard protocol following bolus administration of intravenous contrast. RADIATION DOSE REDUCTION: This exam was performed according to the departmental dose-optimization program which includes automated exposure control, adjustment of the mA and/or kV according to patient size and/or use of iterative reconstruction technique. CONTRAST:  75mL OMNIPAQUE  IOHEXOL  350 MG/ML SOLN COMPARISON:  PET-CT scan from 09/21/2022 and CT scan abdomen and pelvis from 07/14/2021. FINDINGS: Lower chest: The lung bases are clear. No pleural effusion. The heart is normal in size. No pericardial effusion. Hepatobiliary: The liver is normal in size. Non-cirrhotic configuration. No suspicious mass. These is mild diffuse hepatic steatosis. No intrahepatic or extrahepatic bile duct dilation. No calcified gallstones. Normal gallbladder wall thickness. No pericholecystic inflammatory changes. Pancreas: Unremarkable. No pancreatic ductal dilatation or surrounding inflammatory changes. Spleen: Within normal limits. No focal lesion. Adrenals/Urinary Tract: Adrenal glands are unremarkable. There is a 9 x 11 mm partially exophytic lesion arising from the left kidney lower pole, laterally, which is favored to represent a proteinaceous/hemorrhagic cyst, unchanged since the prior study from 07/14/2021. There is a smaller simple cyst in the right kidney lower pole. Post cryoablation focal hyperattenuating area/posttreatment changes noted in the right kidney interpolar region, posteriorly. No discrete hyperattenuating areas seen to suggest local recurrence. No nephroureterolithiasis or obstructive uropathy on either side. Unremarkable urinary bladder. Stomach/Bowel: No disproportionate dilation of the small or large bowel loops. No evidence of  abnormal bowel wall thickening or inflammatory changes. The appendix is unremarkable. Vascular/Lymphatic: No ascites or pneumoperitoneum. No abdominal or pelvic lymphadenopathy, by size criteria. No aneurysmal dilation of the major abdominal arteries. There are mild peripheral atherosclerotic vascular calcifications of the aorta and its major branches. Reproductive: Normal size prostate. Symmetric seminal vesicles. Other: There is a small fat containing periumbilical hernia. The soft tissues and abdominal wall are otherwise unremarkable. Musculoskeletal: No suspicious osseous lesions. There are mild multilevel degenerative changes in the visualized spine. IMPRESSION: 1. No acute inflammatory process identified within the abdomen or pelvis. No nephroureterolithiasis or obstructive uropathy. 2. Posttreatment changes noted in the right kidney interpolar region, posteriorly. No local recurrent or test metastatic disease identified within the abdomen or pelvis. 3. Multiple other nonacute observations,  as described above. 4. Aortic atherosclerosis. Electronically Signed   By: Beula Brunswick M.D.   On: 05/15/2023 18:33     Past medical hx Past Medical History:  Diagnosis Date   AIDS (acquired immune deficiency syndrome) (HCC) 08/17/2016   Amaurosis fugax 08/18/2022   Chronic diastolic CHF (congestive heart failure), NYHA class 3 (HCC) 01/2016   Chronic lower back pain    CKD (chronic kidney disease), stage III (HCC)    COPD (chronic obstructive pulmonary disease) (HCC)    Gout    "forearms, hands, ankles, feet" (06/05/2016)   Headache    "weekly" (06/05/2016)   Heart murmur    Hypertension    Hypertensive crisis 08/15/2017   Lipoma 07/06/2022   OSA on CPAP    PAD (peripheral artery disease) (HCC)    PAF (paroxysmal atrial fibrillation) (HCC) 01/2016   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     Social History   Tobacco Use   Smoking status: Every Day    Current packs/day: 0.10     Average packs/day: 0.1 packs/day for 42.0 years (4.2 ttl pk-yrs)    Types: Cigarettes    Passive exposure: Never   Smokeless tobacco: Never   Tobacco comments:    1-2 cigarettes a day 09/29/22 Tay  Vaping Use   Vaping status: Every Day   Substances: Nicotine , Flavoring  Substance Use Topics   Alcohol use: Not Currently    Alcohol/week: 2.0 standard drinks of alcohol    Types: 2 Cans of beer per week    Comment: rare   Drug use: Not Currently    Comment: rare    Mr.Holzmann reports that he has been smoking cigarettes. He has a 4.2 pack-year smoking history. He has never been exposed to tobacco smoke. He has never used smokeless tobacco. He reports that he does not currently use alcohol after a past usage of about 2.0 standard drinks of alcohol per week. He reports that he does not currently use drugs.  Tobacco Cessation: Ready to quit: Not Answered Counseling given: Not Answered Tobacco comments: 1-2 cigarettes a day 09/29/22 Tay Current every day smoker . Counseled to quit x 3 minutes  Past surgical hx, Family hx, Social hx all reviewed.  Current Outpatient Medications on File Prior to Visit  Medication Sig   albuterol  (VENTOLIN  HFA) 108 (90 Base) MCG/ACT inhaler Inhale 1-2 puffs into the lungs every 6 (six) hours as needed for wheezing or shortness of breath.   albuterol  (VENTOLIN  HFA) 108 (90 Base) MCG/ACT inhaler Inhale 2-4 puffs into the lungs every 3 (three) hours as needed for wheezing or shortness of breath.   amLODipine -valsartan  (EXFORGE ) 10-320 MG tablet Take 1 tablet by mouth daily.   aspirin  EC (ASPIRIN  LOW DOSE) 81 MG tablet Take 1 tablet (81 mg total) by mouth daily, swallow whole   Aspirin -Salicylamide-Caffeine (BC HEADACHE POWDER PO) Take 1 packet by mouth every 8 (eight) hours as needed (for headaches).   atorvastatin  (LIPITOR) 80 MG tablet Take 1 tablet (80 mg total) by mouth daily.   busPIRone  (BUSPAR ) 7.5 MG tablet Take 1 tablet (7.5 mg total) by mouth 2 (two)  times daily. (Patient taking differently: Take 7.5 mg by mouth in the morning and at bedtime.)   dolutegravir -lamiVUDine  (DOVATO ) 50-300 MG tablet Take 1 tablet by mouth daily.   Fluticasone -Umeclidin-Vilant (TRELEGY ELLIPTA ) 200-62.5-25 MCG/ACT AEPB Inhale 1 puff into the lungs daily.   ipratropium-albuterol  (DUONEB) 0.5-2.5 (3) MG/3ML SOLN Take 3 mLs by nebulization every 6 (six) hours as needed. (  Patient taking differently: Take 3 mLs by nebulization every 6 (six) hours as needed (for shortness of breath or wheezing).)   isosorbide  mononitrate (IMDUR ) 60 MG 24 hr tablet Take 1 tablet (60 mg total) by mouth daily. (Patient taking differently: Take 60 mg by mouth daily. Causes headache,not taking as 4/21)   montelukast  (SINGULAIR ) 10 MG tablet Take 1 tablet (10 mg total) by mouth at bedtime.   nebivolol  (BYSTOLIC ) 5 MG tablet Take 1 tablet (5 mg total) by mouth daily.   sucralfate  (CARAFATE ) 1 g tablet Take 1 tablet (1 g total) by mouth 4 (four) times daily -  with meals and at bedtime for 14 days.   pantoprazole  (PROTONIX ) 20 MG tablet Take 1 tablet (20 mg total) by mouth daily. (Patient not taking: Reported on 06/06/2023)   No current facility-administered medications on file prior to visit.     No Known Allergies  Review Of Systems:  Constitutional:   No  weight loss, night sweats,  Fevers, chills, fatigue, or  lassitude.  HEENT:   + headaches,  Difficulty swallowing,  Tooth/dental problems, or  Sore throat,                No sneezing, itching, ear ache, nasal congestion, post nasal drip,   CV:  No chest pain,  Orthopnea, PND, swelling in lower extremities, anasarca, dizziness, palpitations, syncope.   GI  No heartburn, indigestion, abdominal pain, nausea, vomiting, diarrhea, change in bowel habits, loss of appetite, bloody stools.   Resp: + shortness of breath with exertion less at rest.  No excess mucus, no productive cough,  No non-productive cough,  No coughing up of blood.  No  change in color of mucus.  No wheezing.  No chest wall deformity  Skin: no rash or lesions.  GU: no dysuria, change in color of urine, no urgency or frequency.  No flank pain, no hematuria   MS:  No joint pain or swelling.  No decreased range of motion.  No back pain.  Psych:  No change in mood or affect. No depression or anxiety.  No memory loss.   Vital Signs BP (!) 184/107 (BP Location: Left Arm, Patient Position: Sitting, Cuff Size: Normal)   Pulse (!) 54   Ht 6\' 1"  (1.854 m)   Wt 162 lb 9.6 oz (73.8 kg)   SpO2 (!) 54%   BMI 21.45 kg/m    Physical Exam:  General- No distress,  A&Ox3, impatient ENT: No sinus tenderness, TM clear, pale nasal mucosa, no oral exudate,no post nasal drip, no LAN Cardiac: S1, S2, regular rate and rhythm, no murmur Chest: No wheeze/ rales/ dullness; no accessory muscle use, no nasal flaring, no sternal retractions Abd.: Soft Non-tender, ND, BS +, Body mass index is 21.45 kg/m.  Ext: No clubbing cyanosis, edema, no obvious deformities Neuro:  normal strength, MAE x 4, A&O x 3 Skin: No rashes, warm and dry, no obvious lesions  Psych: normal mood and behavior, impatient   Assessment/Plan Current every day smoker  Multiple Pulmonary Nodules with slowly growing solid component Mild hypermetabolism within mediastinal and right hilar Nodes 09/21/2022 Plan I have placed an order for a bronchoscopy with biopsies.  We have discussed the procedure in detail.  We have reviewed the risks and benefits of the procedure. These include bleeding, infection, puncture of the lung, and adverse reaction to anesthesia. You have agreed to proceed with biopsy to evaluate the right lower lobe  Your procedure will be done by Dr. [name]  [place]. You will receive a letter today with date time and information pertaining to the procedure. You will need someone to drive you to the procedure, stay with you during the procedure, and stay with you after the procedure. You will  also need someone to stay with you for 24 hours after anesthesia to ensure you have cleared and are doing well. You will follow-up with me 1 week after the procedure to review the results and to ensure you are doing well. Call if you need us  prior to the procedure or if you have any questions at all. Please contact office for sooner follow up if symptoms do not improve or worsen or seek emergency care     Hypertension Hypertension with readings up to 220/110. Headaches with current medication. Cardiologist consultation advised. - Advise to contact cardiologist for alternative medications. - Instruct to monitor blood pressure at home and consult cardiologist if high. -Your BP today was 190/110, which is very high and puts you at risk of stroke. Please take your blood pressure medication when you get home.    COPD Continue Trelegy 1 puff once daily Rinse mouth after use Call pharmacy about getting refills that are ordered and on your med profile We will give you samples today to get you through until when you can get your refills. Albuterol  1-2 puffs as needed for breakthrough shortness of breath or wheezing. Call if you need to see us  sooner. Note your daily symptoms > remember "red flags" for COPD:  Increase in cough, increase in sputum production, increase in shortness of breath or activity intolerance. If you notice these symptoms, please call to be seen.     I spent 30 minutes dedicated to the care of this patient on the date of this encounter to include pre-visit review of records, face-to-face time with the patient discussing conditions above, post visit ordering of testing, clinical documentation with the electronic health record, making appropriate referrals as documented, and communicating necessary information to the patient's healthcare team.   Raejean Bullock, NP 06/06/2023  9:57 AM

## 2023-06-06 NOTE — H&P (View-Only) (Signed)
 History of Present Illness Kevin [name] is a  68 y.o. male current everyday smoker followed by Dr. [name] for a pulmonary nodule.   Synopsis 68 year old patient current everyday smoker with a history of tobacco use (40+ pk-yrs) and COPD, renal cell cancer, HIV on therapy, hypertension and atrial fibrillation with diastolic CHF, OSA on CPAP, gout.  He has been seen by Dr. Jenny Mohs in our office for his COPD and also pulmonary nodular disease on a CT chest 11/2020.  He is also followed by Dr. Dianne Fortune.    CT scan of the chest 09/06/2022 shows multiple stable right sided solid and subsolid pulmonary nodules that were stable or regressed.  2 nodules had increased in size including a 7 mm solid right upper lobe nodule, 15 mm mixed density right lower lobe nodule.  This prompted PET scan. He has been experiencing episodes of acute dyspnea, has been back and forth to the ED multiple times this year. Was started on Trelegy in June 2024. Dyspnea  occurs w exertion, chores or at rest. It Does not wake him from sleep. He coughs through the day, produces mucous in the am. ? Whether there may be a component of panic / anxiety. Albuterol  nebs 3x a day. Unclear triggers. Does have trouble in hot  and humid air.    PET scan 09/21/2022  showed mild hypermetabolism within the mediastinal nodes with SUV max 4.1 of unclear significance.  The right upper lobe peribronchovascular nodules measured at 5 mm.  None of the groundglass nodules had any hypermetabolism.  There was some nonspecific hypermetabolism in the right apex as well as inferior lingula and an area of some minimal airspace disease that was new compared with the CT done 7/24. Pt. Opted for careful monitoring,  and a 6 month follow up scan was scheduled for 02/2023. That scan showed stable nodules, so a 3 month scan was ordered for continued surveillance. He is here today to review the results of the most recent imaging.    Pt. Has consented to use of [employer] soft  wear to help capture the content of this OV   06/06/2023 Kevin [name] is a 68 year old male current every day smoker with a lung nodule and history of renal cancer who presents for follow-up and consideration of a biopsy.  He has a lung nodule in the right lower lobe, measuring 1.4 centimeters, with an increase in the solid component from 4 millimeters to 5 millimeters. Given his history of renal cancer, there is concern for malignancy.  We discussed continued watchful waiting, but he prefers to go ahead and move forward with navigational bronchoscopy with biopsies. We have reviewed risks and benefits of the procedure and he has agreed to move forward with scheduling the bronchoscopy with biopsies.  His renal cancer history includes surgical removal over a year ago, with no follow-up communication with the surgical team since the procedure.  He experiences difficulty sleeping due to not having his Trelegy inhaler, which he states is necessary to avoid emergency room visits. He has not received his Trelegy or albuterol  inhalers from the pharmacy despite having refills available, mentioning that the pharmacy's packaging system may be causing issues with receiving his medications.  He has a history of high blood pressure, with readings as high as 220/110 for the past 20 years. He experiences headaches, which he attributes to a heart medication prescribed by his cardiologist.He is not taking his blood pressure mediations or his Imdur  at present.  I had an extended conversation with patient about his blood pressure. He states it has been high for many years, and that he has been "blessed " that he has not had a stroke. I explained that he is at high risk for stroke, and that it is best not to believe that he will continue to have good luck and  avoid having a stroke. I have asked him to follow up with cardiology, and to take all medication as prescribed. If one medication is causing him headaches, he  needs to see if cardiology can to a therapeutic change. Aaron Aas   He is not currently using a CPAP machine for his diagnosed sleep apnea due to discomfort.  He takes a low-dose aspirin  daily but will need to withhold it before the scheduled navigational bronchoscopy with biopsies.   Test Results: CT Chest 05/25/2023  -Part solid nodule within the right lower lobe measures 1.4 cm with internal solid component measuring 5 mm, image 97/5. On the previous exam this measured 1.4 cm with solid component measuring 4 mm. No acute findings. 2. Stable appearance of 1.4 cm part solid nodule within the right lower lobe with internal solid component measuring 5 mm. Low-grade pulmonary neoplasm such as adenocarcinoma is not excluded. 3. Additional lung nodules are unchanged in the interval. 4. Coronary artery calcifications. 5. Aortic Atherosclerosis (ICD10-I70.0) and Emphysema (ICD10-J43.9).  CT chest 03/07/2023 Mediastinum/Nodes: Slightly patulous thoracic esophagus. Preserved thyroid  gland. On this non IV contrast exam there is no specific abnormal lymph node enlargement identified in the axillary regions, hilum or mediastinum on the prior examination there were some hypermetabolic nodes identified in the mediastinum and right lung hilum. Specific example of a node in the right lung hilum measured 7 mm in short axis on the prior examination and today 6 mm on series 2, image 68. Other nodes are similar as well.   Lungs/Pleura: No consolidation, pneumothorax or effusion. Some underlying chronic lung changes identified. The semi-solid nodular area in the right lower lobe which previously had an AP dimension of 15 mm, today when measured in the same fashion on series 8, image 93 measures 13 mm. This is not have increased uptake on PET. 7 mm right upper lobe more solid nodule on the previous examination today on series 8, image 39 measures 6 mm. Smaller ground-glass in the right lung on series 8, image  70 is stable. Subtle areas of nodularity centrally in the middle lobe on series 8 image 86 and 88 are stable. These are small. No new dominant lung nodules. The area of hypermetabolism at the right lung apex does not have a CT correlate today. Similar area in the lingula without a CT correlate.   Upper Abdomen: Along the upper abdomen the adrenal glands are preserved. There is focal atrophy along the posterior aspect of the right kidney. Please correlate with prior intervention or other process.   Musculoskeletal: Mild degenerative changes along the spine.   IMPRESSION: Multiple solid and semi solid lung nodules are unchanged in size compared to the prior CT scan. Recommend continued short-term surveillance.   Previously there were some hypermetabolic small mediastinal hilar nodes which are unchanged in size. Additional attention on follow-up.         Latest Ref Rng & Units 06/05/2023    3:59 PM 05/15/2023    1:29 PM 03/18/2023    9:34 PM  CBC  WBC 4.0 - 10.5 K/uL 7.7  13.0    Hemoglobin 13.0 - 17.0 g/dL 10.5  12.7  12.2   Hematocrit 39.0 - 52.0 % 31.0  37.0  36.0   Platelets 150 - 400 K/uL 263  335         Latest Ref Rng & Units 06/05/2023    3:59 PM 05/15/2023    1:29 PM 03/19/2023    4:37 AM  BMP  Glucose 70 - 99 mg/dL 161  72  096   BUN 8 - 23 mg/dL 21  24  15    Creatinine 0.61 - 1.24 mg/dL 0.45  4.09  8.11   Sodium 135 - 145 mmol/L 140  139  139   Potassium 3.5 - 5.1 mmol/L 4.3  4.6  4.5   Chloride 98 - 111 mmol/L 110  107  114   CO2 22 - 32 mmol/L 23  18  19    Calcium  8.9 - 10.3 mg/dL 8.6  8.9  8.5     BNP    Component Value Date/Time   BNP 39.9 03/18/2023 2355    ProBNP No results found for: "PROBNP"  PFT    Component Value Date/Time   FEV1PRE 1.59 11/24/2022 1551   FEV1POST 1.70 11/24/2022 1551   FVCPRE 3.52 11/24/2022 1551   FVCPOST 3.24 11/24/2022 1551   TLC 8.39 11/24/2022 1551   DLCOUNC 19.08 11/24/2022 1551   PREFEV1FVCRT 45 11/24/2022 1551    PSTFEV1FVCRT 53 11/24/2022 1551    CT CHEST WO CONTRAST Result Date: 06/06/2023 CLINICAL DATA:  Follow-up lung nodules. History of renal cell carcinoma status post ablation. EXAM: CT CHEST WITHOUT CONTRAST TECHNIQUE: Multidetector CT imaging of the chest was performed following the standard protocol without IV contrast. RADIATION DOSE REDUCTION: This exam was performed according to the departmental dose-optimization program which includes automated exposure control, adjustment of the mA and/or kV according to patient size and/or use of iterative reconstruction technique. COMPARISON:  03/07/2023 FINDINGS: Cardiovascular: Normal heart size. Aortic atherosclerosis. The ascending thoracic aorta measures 4 cm, image 88/6. No pericardial effusion. Coronary artery calcification. Mediastinum/Nodes: Thyroid  gland, trachea, and esophagus appear normal. No enlarged mediastinal or axillary lymph nodes. Lungs/Pleura: The central airways appear patent. There is no pleural fluid, interstitial edema or airspace disease. Emphysema with diffuse bronchial wall thickening. -Index nodules include: Right upper lobe lung nodule measures 5 mm, image 45/5. Stable in the interval. -Part solid nodule within the right lower lobe measures 1.4 cm with internal solid component measuring 5 mm, image 97/5. On the previous exam this measured 1.4 cm with solid component measuring 4 mm. -Unchanged ground-glass attenuating nodule along the proximal minor fissure, image 7/75. Unchanged. Upper Abdomen: No acute abnormality. Musculoskeletal: No chest wall mass or suspicious bone lesions identified. IMPRESSION: 1. No acute findings. 2. Stable appearance of 1.4 cm part solid nodule within the right lower lobe with internal solid component measuring 5 mm. Low-grade pulmonary neoplasm such as adenocarcinoma is not excluded. 3. Additional lung nodules are unchanged in the interval. 4. Coronary artery calcifications. 5. Aortic Atherosclerosis (ICD10-I70.0)  and Emphysema (ICD10-J43.9). Electronically Signed   By: Kimberley Penman M.D.   On: 06/06/2023 08:20   CT HEAD WO CONTRAST ( ) Result Date: 06/05/2023 EXAM: CT HEAD WITHOUT 06/05/2023 05:24:56 PM TECHNIQUE: CT of the head was performed without the administration of intravenous contrast. Automated exposure control, iterative reconstruction, and/or weight based adjustment of the mA/kV was utilized to reduce the radiation dose to as low as reasonably achievable. COMPARISON: CT Head dated 10/25/2022. CLINICAL HISTORY: Headache, new onset (Age 42y). C/O - Shortness of Breath; Headache; Hypertension. FINDINGS:  BRAIN AND VENTRICLES: There is no acute intracranial hemorrhage, mass effect or midline shift. No abnormal extra-axial fluid collection. The gray-white differentiation is maintained without evidence of an acute infarct. There is mild scattered periventricular and subcortical low density, likely reflecting small vessel ischemic changes. There is no evidence of hydrocephalus. ORBITS: The visualized portion of the orbits demonstrate no acute abnormality. SINUSES: The visualized paranasal sinuses and mastoid air cells demonstrate no acute abnormality. SOFT TISSUES AND SKULL: No acute abnormality of the visualized skull or soft tissues. IMPRESSION: 1. No acute intracranial abnormality. Electronically signed by: Zadie Herter MD 06/05/2023 05:39 PM EDT RP Workstation: [id number]   DG Chest 2 View Result Date: 06/05/2023 CLINICAL DATA:  Chest pain, shortness of breath. EXAM: CHEST - 2 VIEW COMPARISON:  March 18, 2023. FINDINGS: The heart size and mediastinal contours are within normal limits. Both lungs are clear. The visualized skeletal structures are unremarkable. IMPRESSION: No active cardiopulmonary disease. Electronically Signed   By: Rosalene Colon M.D.   On: 06/05/2023 15:45   CT ABDOMEN PELVIS W CONTRAST Result Date: 05/15/2023 CLINICAL DATA:  Abdominal pain, acute, nonlocalized h/o kidney cancer,  was having central abdominal pain radiating to the right. * Tracking Code: BO * EXAM: CT ABDOMEN AND PELVIS WITH CONTRAST TECHNIQUE: Multidetector CT imaging of the abdomen and pelvis was performed using the standard protocol following bolus administration of intravenous contrast. RADIATION DOSE REDUCTION: This exam was performed according to the departmental dose-optimization program which includes automated exposure control, adjustment of the mA and/or kV according to patient size and/or use of iterative reconstruction technique. CONTRAST:  75mL OMNIPAQUE  IOHEXOL  350 MG/ML SOLN COMPARISON:  PET-CT scan from 09/21/2022 and CT scan abdomen and pelvis from 07/14/2021. FINDINGS: Lower chest: The lung bases are clear. No pleural effusion. The heart is normal in size. No pericardial effusion. Hepatobiliary: The liver is normal in size. Non-cirrhotic configuration. No suspicious mass. These is mild diffuse hepatic steatosis. No intrahepatic or extrahepatic bile duct dilation. No calcified gallstones. Normal gallbladder wall thickness. No pericholecystic inflammatory changes. Pancreas: Unremarkable. No pancreatic ductal dilatation or surrounding inflammatory changes. Spleen: Within normal limits. No focal lesion. Adrenals/Urinary Tract: Adrenal glands are unremarkable. There is a 9 x 11 mm partially exophytic lesion arising from the left kidney lower pole, laterally, which is favored to represent a proteinaceous/hemorrhagic cyst, unchanged since the prior study from 07/14/2021. There is a smaller simple cyst in the right kidney lower pole. Post cryoablation focal hyperattenuating area/posttreatment changes noted in the right kidney interpolar region, posteriorly. No discrete hyperattenuating areas seen to suggest local recurrence. No nephroureterolithiasis or obstructive uropathy on either side. Unremarkable urinary bladder. Stomach/Bowel: No disproportionate dilation of the small or large bowel loops. No evidence of  abnormal bowel wall thickening or inflammatory changes. The appendix is unremarkable. Vascular/Lymphatic: No ascites or pneumoperitoneum. No abdominal or pelvic lymphadenopathy, by size criteria. No aneurysmal dilation of the major abdominal arteries. There are mild peripheral atherosclerotic vascular calcifications of the aorta and its major branches. Reproductive: Normal size prostate. Symmetric seminal vesicles. Other: There is a small fat containing periumbilical hernia. The soft tissues and abdominal wall are otherwise unremarkable. Musculoskeletal: No suspicious osseous lesions. There are mild multilevel degenerative changes in the visualized spine. IMPRESSION: 1. No acute inflammatory process identified within the abdomen or pelvis. No nephroureterolithiasis or obstructive uropathy. 2. Posttreatment changes noted in the right kidney interpolar region, posteriorly. No local recurrent or test metastatic disease identified within the abdomen or pelvis. 3. Multiple other nonacute observations,  as described above. 4. Aortic atherosclerosis. Electronically Signed   By: Beula Brunswick M.D.   On: 05/15/2023 18:33     Past medical hx Past Medical History:  Diagnosis Date   AIDS (acquired immune deficiency syndrome) (HCC) 08/17/2016   Amaurosis fugax 08/18/2022   Chronic diastolic CHF (congestive heart failure), NYHA class 3 (HCC) 01/2016   Chronic lower back pain    CKD (chronic kidney disease), stage III (HCC)    COPD (chronic obstructive pulmonary disease) (HCC)    Gout    "forearms, hands, ankles, feet" (06/05/2016)   Headache    "weekly" (06/05/2016)   Heart murmur    Hypertension    Hypertensive crisis 08/15/2017   Lipoma 07/06/2022   OSA on CPAP    PAD (peripheral artery disease) (HCC)    PAF (paroxysmal atrial fibrillation) (HCC) 01/2016   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     Social History   Tobacco Use   Smoking status: Every Day    Current packs/day: 0.10     Average packs/day: 0.1 packs/day for 42.0 years (4.2 ttl pk-yrs)    Types: Cigarettes    Passive exposure: Never   Smokeless tobacco: Never   Tobacco comments:    1-2 cigarettes a day 09/29/22 Tay  Vaping Use   Vaping status: Every Day   Substances: Nicotine , Flavoring  Substance Use Topics   Alcohol use: Not Currently    Alcohol/week: 2.0 standard drinks of alcohol    Types: 2 Cans of beer per week    Comment: rare   Drug use: Not Currently    Comment: rare    Mr.Naeve reports that he has been smoking cigarettes. He has a 4.2 pack-year smoking history. He has never been exposed to tobacco smoke. He has never used smokeless tobacco. He reports that he does not currently use alcohol after a past usage of about 2.0 standard drinks of alcohol per week. He reports that he does not currently use drugs.  Tobacco Cessation: Ready to quit: Not Answered Counseling given: Not Answered Tobacco comments: 1-2 cigarettes a day 09/29/22 Tay Current every day smoker . Counseled to quit x 3 minutes  Past surgical hx, Family hx, Social hx all reviewed.  Current Outpatient Medications on File Prior to Visit  Medication Sig   albuterol  (VENTOLIN  HFA) 108 (90 Base) MCG/ACT inhaler Inhale 1-2 puffs into the lungs every 6 (six) hours as needed for wheezing or shortness of breath.   albuterol  (VENTOLIN  HFA) 108 (90 Base) MCG/ACT inhaler Inhale 2-4 puffs into the lungs every 3 (three) hours as needed for wheezing or shortness of breath.   amLODipine -valsartan  (EXFORGE ) 10-320 MG tablet Take 1 tablet by mouth daily.   aspirin  EC (ASPIRIN  LOW DOSE) 81 MG tablet Take 1 tablet (81 mg total) by mouth daily, swallow whole   Aspirin -Salicylamide-Caffeine (BC HEADACHE POWDER PO) Take 1 packet by mouth every 8 (eight) hours as needed (for headaches).   atorvastatin  (LIPITOR) 80 MG tablet Take 1 tablet (80 mg total) by mouth daily.   busPIRone  (BUSPAR ) 7.5 MG tablet Take 1 tablet (7.5 mg total) by mouth 2 (two)  times daily. (Patient taking differently: Take 7.5 mg by mouth in the morning and at bedtime.)   dolutegravir -lamiVUDine  (DOVATO ) 50-300 MG tablet Take 1 tablet by mouth daily.   Fluticasone -Umeclidin-Vilant (TRELEGY ELLIPTA ) 200-62.5-25 MCG/ACT AEPB Inhale 1 puff into the lungs daily.   ipratropium-albuterol  (DUONEB) 0.5-2.5 (3) MG/3ML SOLN Take 3 mLs by nebulization every 6 (six) hours as needed. (  Patient taking differently: Take 3 mLs by nebulization every 6 (six) hours as needed (for shortness of breath or wheezing).)   isosorbide  mononitrate (IMDUR ) 60 MG 24 hr tablet Take 1 tablet (60 mg total) by mouth daily. (Patient taking differently: Take 60 mg by mouth daily. Causes headache,not taking as 4/21)   montelukast  (SINGULAIR ) 10 MG tablet Take 1 tablet (10 mg total) by mouth at bedtime.   nebivolol  (BYSTOLIC ) 5 MG tablet Take 1 tablet (5 mg total) by mouth daily.   sucralfate  (CARAFATE ) 1 g tablet Take 1 tablet (1 g total) by mouth 4 (four) times daily -  with meals and at bedtime for 14 days.   pantoprazole  (PROTONIX ) 20 MG tablet Take 1 tablet (20 mg total) by mouth daily. (Patient not taking: Reported on 06/06/2023)   No current facility-administered medications on file prior to visit.     No Known Allergies  Review Of Systems:  Constitutional:   No  weight loss, night sweats,  Fevers, chills, fatigue, or  lassitude.  HEENT:   + headaches,  Difficulty swallowing,  Tooth/dental problems, or  Sore throat,                No sneezing, itching, ear ache, nasal congestion, post nasal drip,   CV:  No chest pain,  Orthopnea, PND, swelling in lower extremities, anasarca, dizziness, palpitations, syncope.   GI  No heartburn, indigestion, abdominal pain, nausea, vomiting, diarrhea, change in bowel habits, loss of appetite, bloody stools.   Resp: + shortness of breath with exertion less at rest.  No excess mucus, no productive cough,  No non-productive cough,  No coughing up of blood.  No  change in color of mucus.  No wheezing.  No chest wall deformity  Skin: no rash or lesions.  GU: no dysuria, change in color of urine, no urgency or frequency.  No flank pain, no hematuria   MS:  No joint pain or swelling.  No decreased range of motion.  No back pain.  Psych:  No change in mood or affect. No depression or anxiety.  No memory loss.   Vital Signs BP (!) 184/107 (BP Location: Left Arm, Patient Position: Sitting, Cuff Size: Normal)   Pulse (!) 54   Ht 6\' 1"  (1.854 m)   Wt 162 lb 9.6 oz (73.8 kg)   SpO2 (!) 54%   BMI 21.45 kg/m    Physical Exam:  General- No distress,  A&Ox3, impatient ENT: No sinus tenderness, TM clear, pale nasal mucosa, no oral exudate,no post nasal drip, no LAN Cardiac: S1, S2, regular rate and rhythm, no murmur Chest: No wheeze/ rales/ dullness; no accessory muscle use, no nasal flaring, no sternal retractions Abd.: Soft Non-tender, ND, BS +, Body mass index is 21.45 kg/m.  Ext: No clubbing cyanosis, edema, no obvious deformities Neuro:  normal strength, MAE x 4, A&O x 3 Skin: No rashes, warm and dry, no obvious lesions  Psych: normal mood and behavior, impatient   Assessment/Plan Current every day smoker  Multiple Pulmonary Nodules with slowly growing solid component Mild hypermetabolism within mediastinal and right hilar Nodes 09/21/2022 Plan I have placed an order for a bronchoscopy with biopsies.  We have discussed the procedure in detail.  We have reviewed the risks and benefits of the procedure. These include bleeding, infection, puncture of the lung, and adverse reaction to anesthesia. You have agreed to proceed with biopsy to evaluate the right lower lobe  Your procedure will be done by Dr. [name]  [place]. You will receive a letter today with date time and information pertaining to the procedure. You will need someone to drive you to the procedure, stay with you during the procedure, and stay with you after the procedure. You will  also need someone to stay with you for 24 hours after anesthesia to ensure you have cleared and are doing well. You will follow-up with me 1 week after the procedure to review the results and to ensure you are doing well. Call if you need us  prior to the procedure or if you have any questions at all. Please contact office for sooner follow up if symptoms do not improve or worsen or seek emergency care     Hypertension Hypertension with readings up to 220/110. Headaches with current medication. Cardiologist consultation advised. - Advise to contact cardiologist for alternative medications. - Instruct to monitor blood pressure at home and consult cardiologist if high. -Your BP today was 190/110, which is very high and puts you at risk of stroke. Please take your blood pressure medication when you get home.    COPD Continue Trelegy 1 puff once daily Rinse mouth after use Call pharmacy about getting refills that are ordered and on your med profile We will give you samples today to get you through until when you can get your refills. Albuterol  1-2 puffs as needed for breakthrough shortness of breath or wheezing. Call if you need to see us  sooner. Note your daily symptoms > remember "red flags" for COPD:  Increase in cough, increase in sputum production, increase in shortness of breath or activity intolerance. If you notice these symptoms, please call to be seen.     I spent 30 minutes dedicated to the care of this patient on the date of this encounter to include pre-visit review of records, face-to-face time with the patient discussing conditions above, post visit ordering of testing, clinical documentation with the electronic health record, making appropriate referrals as documented, and communicating necessary information to the patient's healthcare team.   Raejean Bullock, NP 06/06/2023  9:57 AM

## 2023-06-06 NOTE — Patient Instructions (Signed)
 I have placed an order for a bronchoscopy with biopsies.  We have discussed the procedure in detail.  We have reviewed the risks and benefits of the procedure. These include bleeding, infection, puncture of the lung, and adverse reaction to anesthesia. You have agreed to proceed with biopsy to evaluate the right lower lobe  Your procedure will be done by Dr. Racheal Buddle. You will receive a letter today with date time and information pertaining to the procedure. You will need someone to drive you to the procedure, stay with you during the procedure, and stay with you after the procedure. You will also need someone to stay with you for 24 hours after anesthesia to ensure you have cleared and are doing well. You will follow-up with me 1 week after the procedure to review the results and to ensure you are doing well. Call if you need us  prior to the procedure or if you have any questions at all. Please contact office for sooner follow up if symptoms do not improve or worsen or seek emergency care    Please follow up with cardiology regarding your BP, and that you are not taking your Imdur  as it is causing headache. Your BP today was 190/110, which is very high and puts you at risk of stroke. Please take your blood pressure medication when you get home.

## 2023-06-07 ENCOUNTER — Other Ambulatory Visit (HOSPITAL_COMMUNITY): Payer: Self-pay

## 2023-06-07 ENCOUNTER — Encounter (HOSPITAL_BASED_OUTPATIENT_CLINIC_OR_DEPARTMENT_OTHER): Payer: Self-pay

## 2023-06-07 DIAGNOSIS — E559 Vitamin D deficiency, unspecified: Secondary | ICD-10-CM | POA: Diagnosis not present

## 2023-06-07 DIAGNOSIS — N1832 Chronic kidney disease, stage 3b: Secondary | ICD-10-CM | POA: Diagnosis not present

## 2023-06-07 NOTE — Anesthesia Preprocedure Evaluation (Addendum)
 Anesthesia Evaluation  Patient identified by MRN, date of birth, ID band Patient awake    Reviewed: Allergy & Precautions, NPO status , Patient's Chart, lab work & pertinent test results, reviewed documented beta blocker date and time   Airway Mallampati: III  TM Distance: >3 FB Neck ROM: Full    Dental  (+) Teeth Intact, Dental Advisory Given   Pulmonary sleep apnea (noncompliant w/ cpap) , COPD,  COPD inhaler, Current SmokerPatient did not abstain from smoking. history of tobacco use (40+ pk-yrs) and COPD with frequent exacerbations  has been followed by pulmonology for hx of multiple pulmonary nodules with slowly growing solid component   Pulmonary exam normal breath sounds clear to auscultation       Cardiovascular hypertension (very poorly controlled, 195/100 R arm, 155/110 L arm), Pt. on medications and Pt. on home beta blockers + CAD, + Peripheral Vascular Disease and +CHF (LVEF 45% on stress test 2024, grade 1 diastolic dysfunction on echo 2023)  Normal cardiovascular exam+ dysrhythmias (PAF, no a/c) Atrial Fibrillation + Valvular Problems/Murmurs  Rhythm:Regular Rate:Normal  Nuclear stress 09/26/22:   Normal perfusion. LVEF mildly reduced 43%. Recent echo noted to be normal. Suspect the value on this study is artifact.   LV perfusion is normal. There is no evidence of ischemia. There is no evidence of infarction.   Left ventricular function is abnormal. Nuclear stress EF: 43%. The left ventricular ejection fraction is moderately decreased (30-44%). End diastolic cavity size is mildly enlarged.   The study is normal. The study is low risk.   TTE 12/27/21:  1. Left ventricular ejection fraction, by estimation, is 60 to 65%. The  left ventricle has normal function. The left ventricle has no regional  wall motion abnormalities. There is moderate concentric left ventricular  hypertrophy. Left ventricular  diastolic parameters  are consistent with Grade I diastolic dysfunction  (impaired relaxation).   2. Right ventricular systolic function is normal. The right ventricular  size is normal. There is mildly elevated pulmonary artery systolic  pressure.   3. Left atrial size was mildly dilated.   4. The mitral valve is normal in structure. No evidence of mitral valve  regurgitation. No evidence of mitral stenosis.   5. The aortic valve is grossly normal. Aortic valve regurgitation is not  visualized. No aortic stenosis is present.   6. The inferior vena cava is normal in size with greater than 50%  respiratory variability, suggesting right atrial pressure of 3 mmHg.      Neuro/Psych  Headaches PSYCHIATRIC DISORDERS Anxiety        GI/Hepatic Neg liver ROS,GERD  Medicated and Controlled,,  Endo/Other  negative endocrine ROS    Renal/GU CRF and ESRFRenal diseaseRenal cell carcinoma   negative genitourinary   Musculoskeletal negative musculoskeletal ROS (+)    Abdominal   Peds  Hematology  (+) HIV  Anesthesia Other Findings   Reproductive/Obstetrics negative OB ROS                             Anesthesia Physical Anesthesia Plan  ASA: 3  Anesthesia Plan: General   Post-op Pain Management: Tylenol  PO (pre-op )*   Induction: Intravenous  PONV Risk Score and Plan: 1 and Ondansetron , Dexamethasone , Treatment may vary due to age or medical condition and Midazolam   Airway Management Planned: Oral ETT  Additional Equipment: None  Intra-op Plan:   Post-operative Plan: Extubation in OR  Informed Consent: I have reviewed the patients  History and Physical, chart, labs and discussed the procedure including the risks, benefits and alternatives for the proposed anesthesia with the patient or authorized representative who has indicated his/her understanding and acceptance.     Dental advisory given  Plan Discussed with: CRNA  Anesthesia Plan Comments: (D/w pt inc risk of  cardiac complications inc MI and stroke perioperatively because of his poorly controlled BP. Pt states this has been ongoing issue for multiple decades and wishes to proceed.)        Anesthesia Quick Evaluation

## 2023-06-07 NOTE — Progress Notes (Signed)
 Anesthesia Chart Review: Same day workup  69 year old male with pertinent hx including current everyday smoker with a history of tobacco use (40+ pk-yrs) and COPD with frequent exacerbations, OSA on CPAP, renal cell cancer, HIV on therapy, hypertension and atrial fibrillation with diastolic CHF, PAD s/p left SFA intervention 07/2016, CKD 3b, OSA on CPAP, gout   He has been followed by pulmonology for hx of multiple pulmonary nodules with slowly growing solid component and had been recommended to undergo bronchoscopy with biopsy.   Echo 12/2021 LVEF 60-65%, moderate LVH, gr1DD, mildly elevated PASP, no significant valvular abnormalities. Carotid duplex 08/2022 mild nonobstructive 1-39% stenosis bilaterally. Myoview  09/26/22 no ischemia, LVEF 43%. He is followed in the advanced hypertension clinic. Currently maintained on  Amlodipine -Valsartan  10-320mg  daily, Nebivolol  5mg  daily, Imdur  60mg  daily.  CMP and CBC 06/05/23 reviewed, creatinine elevated 2.27 c/w hx of CKD, mild anemia Hgb 10.5, otherwise unremarkable.  Pt will need DOS evaluation.   EKG 06/05/23: Sinus rhythm. Rate 78. LVH with secondary repolarization abnormality. Probable anterior infarct, age indeterminat  CT Chest 05/25/23: IMPRESSION: 1. No acute findings. 2. Stable appearance of 1.4 cm part solid nodule within the right lower lobe with internal solid component measuring 5 mm. Low-grade pulmonary neoplasm such as adenocarcinoma is not excluded. 3. Additional lung nodules are unchanged in the interval. 4. Coronary artery calcifications. 5. Aortic Atherosclerosis (ICD10-I70.0) and Emphysema (ICD10-J43.9).  Nuclear stress 09/26/22:   Normal perfusion. LVEF mildly reduced 43%. Recent echo noted to be normal. Suspect the value on this study is artifact.   LV perfusion is normal. There is no evidence of ischemia. There is no evidence of infarction.   Left ventricular function is abnormal. Nuclear stress EF: 43%. The left ventricular  ejection fraction is moderately decreased (30-44%). End diastolic cavity size is mildly enlarged.   The study is normal. The study is low risk.  TTE 12/27/21:  1. Left ventricular ejection fraction, by estimation, is 60 to 65%. The  left ventricle has normal function. The left ventricle has no regional  wall motion abnormalities. There is moderate concentric left ventricular  hypertrophy. Left ventricular  diastolic parameters are consistent with Grade I diastolic dysfunction  (impaired relaxation).   2. Right ventricular systolic function is normal. The right ventricular  size is normal. There is mildly elevated pulmonary artery systolic  pressure.   3. Left atrial size was mildly dilated.   4. The mitral valve is normal in structure. No evidence of mitral valve  regurgitation. No evidence of mitral stenosis.   5. The aortic valve is grossly normal. Aortic valve regurgitation is not  visualized. No aortic stenosis is present.   6. The inferior vena cava is normal in size with greater than 50%  respiratory variability, suggesting right atrial pressure of 3 mmHg.   Comparison(s): No significant change from prior study.    Edilia Gordon [facility] Short Stay Center/Anesthesiology Phone [phone] 06/07/2023 11:13 AM

## 2023-06-08 ENCOUNTER — Other Ambulatory Visit (HOSPITAL_COMMUNITY): Payer: Self-pay

## 2023-06-08 ENCOUNTER — Other Ambulatory Visit: Payer: Self-pay

## 2023-06-08 ENCOUNTER — Encounter (HOSPITAL_COMMUNITY): Payer: Self-pay | Admitting: Emergency Medicine

## 2023-06-08 ENCOUNTER — Ambulatory Visit (HOSPITAL_COMMUNITY)
Admission: EM | Admit: 2023-06-08 | Discharge: 2023-06-08 | Disposition: A | Discharge status: Home / Self Care | Attending: Internal Medicine | Admitting: Internal Medicine

## 2023-06-08 DIAGNOSIS — J441 Chronic obstructive pulmonary disease with (acute) exacerbation: Secondary | ICD-10-CM | POA: Diagnosis not present

## 2023-06-08 DIAGNOSIS — R0602 Shortness of breath: Secondary | ICD-10-CM | POA: Diagnosis not present

## 2023-06-08 DIAGNOSIS — I10 Essential (primary) hypertension: Secondary | ICD-10-CM | POA: Insufficient documentation

## 2023-06-08 MED ORDER — METHYLPREDNISOLONE SODIUM SUCC 125 MG IJ SOLR: 80.0000 mg | Freq: Once | INTRAMUSCULAR | Status: DC

## 2023-06-08 MED ORDER — IPRATROPIUM-ALBUTEROL 0.5-2.5 (3) MG/3ML IN SOLN: 3.0000 mL | RESPIRATORY_TRACT | 0 refills | Status: DC | PRN

## 2023-06-08 MED ORDER — DEXAMETHASONE SODIUM PHOSPHATE 10 MG/ML IJ SOLN
10.0000 mg | Freq: Once | INTRAMUSCULAR | Status: AC
  Administered 2023-06-08: 10 mg via INTRAMUSCULAR

## 2023-06-08 MED ORDER — PREDNISONE 20 MG PO TABS: 40.0000 mg | ORAL_TABLET | Freq: Every day | ORAL | 0 refills | Status: AC

## 2023-06-08 MED ORDER — IPRATROPIUM-ALBUTEROL 0.5-2.5 (3) MG/3ML IN SOLN
3.0000 mL | Freq: Once | RESPIRATORY_TRACT | Status: AC
  Administered 2023-06-08: 3 mL via RESPIRATORY_TRACT

## 2023-06-08 MED ORDER — METHYLPREDNISOLONE SODIUM SUCC 125 MG IJ SOLR
INTRAMUSCULAR | Status: AC
  Filled 2023-06-08: qty 2

## 2023-06-08 MED ORDER — DEXAMETHASONE SODIUM PHOSPHATE 10 MG/ML IJ SOLN
INTRAMUSCULAR | Status: AC
  Filled 2023-06-08: qty 1

## 2023-06-08 MED ORDER — AZITHROMYCIN 250 MG PO TABS: 250.0000 mg | ORAL_TABLET | Freq: Every day | ORAL | 0 refills | Status: DC

## 2023-06-08 MED ORDER — IPRATROPIUM-ALBUTEROL 0.5-2.5 (3) MG/3ML IN SOLN
RESPIRATORY_TRACT | Status: AC
  Filled 2023-06-08: qty 3

## 2023-06-08 MED ORDER — ALBUTEROL SULFATE (2.5 MG/3ML) 0.083% IN NEBU
2.5000 mg | INHALATION_SOLUTION | Freq: Once | RESPIRATORY_TRACT | Status: AC
  Administered 2023-06-08: 2.5 mg via RESPIRATORY_TRACT

## 2023-06-08 MED ORDER — ALBUTEROL SULFATE (2.5 MG/3ML) 0.083% IN NEBU
INHALATION_SOLUTION | RESPIRATORY_TRACT | Status: AC
  Filled 2023-06-08: qty 3

## 2023-06-08 NOTE — ED Provider Notes (Addendum)
 [facility]    CSN: [id number] Arrival date & time: 06/08/23  1825      History   Chief Complaint Chief Complaint  Patient presents with   Shortness of Breath    HPI Kevin [name] is a 68 y.o. male.   Patient with history of HIV, AIDS, CHF, CKD stage III, COPD, peripheral arterial disease, and paroxysmal atrial fibrillation presents to urgent care for evaluation of shortness of breath, cough, and bilateral chest pain associated with coughing that started 2 to 3 days ago.  States he ran out of his "medication that starts with a P".  Last dose of prednisone  was in February 2025.  Denies other recent antibiotic and steroid use.  Using maintenance inhalers without relief of shortness of breath and cough.  Additionally using rescue inhalers without relief.  Denies leg swelling and new orthopnea.  No changes in weight over the last few days since becoming short of breath.  Denies fever, chills, nausea, vomiting, abdominal pain, and rash.  Blood pressure in triage elevated at 193/135, states he has not taken any of his medications today including his blood pressure medications. Denies headache, dizziness, vision changes, flank pain, ear pain, paresthesias, unilateral extremity weakness, and back pain. Using maintenance and rescue inhalers without any relief of shortness of breath.   Shortness of Breath   Past Medical History:  Diagnosis Date   AIDS (acquired immune deficiency syndrome) (HCC) 08/17/2016   Amaurosis fugax 08/18/2022   Chronic diastolic CHF (congestive heart failure), NYHA class 3 (HCC) 01/2016   Chronic lower back pain    CKD (chronic kidney disease), stage III (HCC)    stage 3b   COPD (chronic obstructive pulmonary disease) (HCC)    Dyspnea    Gout    "forearms, hands, ankles, feet" (06/05/2016)   Headache    "weekly" (06/05/2016)   Heart murmur    never has caused any problems per pt   History of herpes zoster virus 01/2023   Hypertension     Hypertensive crisis 08/15/2017   Lipoma 07/06/2022   Neuromuscular disorder (HCC) 02/05/2023   post herpetic neuralgia    -  Primary   OSA on CPAP    does not use CPAP   PAD (peripheral artery disease) (HCC)    PAF (paroxysmal atrial fibrillation) (HCC) 01/2016   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   Pulmonary nodules 02/2023   Substance abuse (HCC)    in past    Patient Active Problem List   Diagnosis Date Noted   Left renal mass 04/03/2023   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   COPD with acute exacerbation (HCC) 03/19/2023   COPD exacerbation (HCC) 12/21/2022   Anxiety 12/19/2022   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   Skin nodule 08/08/2022   Lung nodule, multiple 07/26/2022   Lipoma 07/06/2022   Chronic low back pain 04/29/2022   Chronic heart failure with preserved ejection fraction (HFpEF) (HCC) 12/27/2021   HLD (hyperlipidemia) 12/27/2021   Elevated troponin 12/27/2021   Right renal mass 06/29/2021   Renal lesion 06/29/2021   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   Acute respiratory failure with hypoxia (HCC)    Callus of foot 07/10/2018   Hypertensive urgency 08/15/2017   CAD (coronary artery disease) 10/19/2016   Easy bruising 08/11/2016   Claudication in peripheral vascular disease (HCC) 06/05/2016   CKD stage 3b, GFR 30-44 ml/min (HCC) - baseline SCr 1.8-2.2    Peripheral arterial disease (HCC) 05/09/2016  OSA (obstructive sleep apnea) 03/24/2016   Essential hypertension 02/18/2016   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   Blurry vision, bilateral    HIV (human immunodeficiency virus infection) (HCC) 08/27/2004    Past Surgical History:   Procedure Laterality Date   COLONOSCOPY N/A 04/24/2023   Procedure: COLONOSCOPY;  Surgeon: Ozell Blunt, MD;  Location: [facility];  Service: Gastroenterology;  Laterality: N/A;   IR RADIOLOGIST EVAL & MGMT  05/31/2021   IR RADIOLOGIST EVAL & MGMT  07/26/2021   LEFT HEART CATH AND CORONARY ANGIOGRAPHY N/A 10/23/2016   Procedure: LEFT HEART CATH AND CORONARY ANGIOGRAPHY;  Surgeon: Arleen Lacer, MD;  Location: [facility] INVASIVE CV LAB;  Service: Cardiovascular;  Laterality: N/A;   LOWER EXTREMITY ANGIOGRAPHY N/A 07/17/2016   Procedure: Lower Extremity Angiography;  Surgeon: Avanell Leigh, MD;  Location: [facility] INVASIVE CV LAB;  Service: Cardiovascular;  Laterality: N/A;   LOWER EXTREMITY INTERVENTION N/A 06/05/2016   Procedure: Lower Extremity Intervention;  Surgeon: Avanell Leigh, MD;  Location: [facility] INVASIVE CV LAB;  Service: Cardiovascular;  Laterality: N/A;   PERIPHERAL VASCULAR ATHERECTOMY Right 07/17/2016   Procedure: Peripheral Vascular Atherectomy;  Surgeon: Avanell Leigh, MD;  Location: [facility] INVASIVE CV LAB;  Service: Cardiovascular;  Laterality: Right;  SFA   PERIPHERAL VASCULAR INTERVENTION  06/05/2016   Procedure: Peripheral Vascular Intervention;  Surgeon: Avanell Leigh, MD;  Location: [facility] INVASIVE CV LAB;  Service: Cardiovascular;;  left SFA   POLYPECTOMY  04/24/2023   Procedure: POLYPECTOMY;  Surgeon: Ozell Blunt, MD;  Location: [facility];  Service: Gastroenterology;;   RADIOLOGY WITH ANESTHESIA Right 06/29/2021   Procedure: RIGHT RENAL CRYOABLATION;  Surgeon: Elene Griffes, MD;  Location: [facility];  Service: Anesthesiology;  Laterality: Right;       Home Medications    Prior to Admission medications   Medication Sig Start Date End Date Taking? Authorizing Provider  azithromycin  (ZITHROMAX ) 250 MG tablet Take 1 tablet (250 mg total) by mouth daily. Take first 2 tablets together, then 1 every day until finished. 06/08/23  Yes Starlene Eaton, FNP  ipratropium-albuterol  (DUONEB)  0.5-2.5 (3) MG/3ML SOLN Take 3 mLs by nebulization every 4 (four) hours as needed. 06/08/23  Yes Starlene Eaton, FNP  predniSONE  (DELTASONE ) 20 MG tablet Take 2 tablets (40 mg total) by mouth daily for 5 days. 06/08/23 06/13/23 Yes StanhopeDanny Dye, FNP  albuterol  (VENTOLIN  HFA) 108 (90 Base) MCG/ACT inhaler Inhale 1-2 puffs into the lungs every 6 (six) hours as needed for wheezing or shortness of breath. 03/14/23   Raejean Bullock, NP  albuterol  (VENTOLIN  HFA) 108 (90 Base) MCG/ACT inhaler Inhale 2-4 puffs into the lungs every 3 (three) hours as needed for wheezing or shortness of breath.    [provider]  amLODipine -valsartan  (EXFORGE ) 10-320 MG tablet Take 1 tablet by mouth daily. 04/19/23   Goodrich, Callie E, PA-C  aspirin  EC (ASPIRIN  LOW DOSE) 81 MG tablet Take 1 tablet (81 mg total) by mouth daily, swallow whole 05/07/23   Catheryn Cluck, MD  Aspirin -Salicylamide-Caffeine (BC HEADACHE POWDER PO) Take 1 packet by mouth every 8 (eight) hours as needed (for headaches).    [provider]  atorvastatin  (LIPITOR) 80 MG tablet Take 1 tablet (80 mg total) by mouth  daily. 02/08/23   Walker, Caitlin S, NP  busPIRone  (BUSPAR ) 7.5 MG tablet Take 1 tablet (7.5 mg total) by mouth 2 (two) times daily. Patient taking differently: Take 7.5 mg by mouth in the morning and at bedtime. 05/30/23 08/28/23  Catheryn Cluck, MD  dolutegravir -lamiVUDine  (DOVATO ) 50-300 MG tablet Take 1 tablet by mouth daily. 07/06/22   Charolette Copier, MD  Fluticasone -Umeclidin-Vilant (TRELEGY ELLIPTA ) 200-62.5-25 MCG/ACT AEPB Inhale 1 puff into the lungs daily. 09/08/22     Fluticasone -Umeclidin-Vilant (TRELEGY ELLIPTA ) 200-62.5-25 MCG/ACT AEPB Inhale 1 puff into the lungs daily at 2 PM. 06/06/23   Raejean Bullock, NP  ipratropium-albuterol  (DUONEB) 0.5-2.5 (3) MG/3ML SOLN Take 3 mLs by nebulization every 6 (six) hours as needed. Patient taking differently: Take 3 mLs by nebulization every 6 (six) hours as  needed (for shortness of breath or wheezing). 02/27/23   Faustina Hood, MD  isosorbide  mononitrate (IMDUR ) 60 MG 24 hr tablet Take 1 tablet (60 mg total) by mouth daily. Patient taking differently: Take 60 mg by mouth daily. Causes headache,not taking as 4/21 02/08/23 07/21/23  Walker, Caitlin S, NP  montelukast  (SINGULAIR ) 10 MG tablet Take 1 tablet (10 mg total) by mouth at bedtime. 02/15/23   Cobb, Mariah Shines, NP  nebivolol  (BYSTOLIC ) 5 MG tablet Take 1 tablet (5 mg total) by mouth daily. 02/08/23   Clearnce Curia, NP  pantoprazole  (PROTONIX ) 20 MG tablet Take 1 tablet (20 mg total) by mouth daily. Patient not taking: Reported on 06/06/2023 05/30/23   Catheryn Cluck, MD  sucralfate  (CARAFATE ) 1 g tablet Take 1 tablet (1 g total) by mouth 4 (four) times daily -  with meals and at bedtime for 14 days. 05/30/23 06/14/23  Catheryn Cluck, MD    Family History Family History  Problem Relation Age of Onset   High blood pressure Mother    Lupus Mother    Seizures Daughter    Heart attack Daughter     Social History Social History   Tobacco Use   Smoking status: Every Day    Current packs/day: 0.10    Average packs/day: 0.1 packs/day for 42.0 years (4.2 ttl pk-yrs)    Types: Cigarettes    Passive exposure: Never   Smokeless tobacco: Never   Tobacco comments:    1-2 cigarettes a day 09/29/22 [place]  Vaping Use   Vaping status: Former   Substances: Nicotine , Flavoring  Substance Use Topics   Alcohol use: Not Currently    Alcohol/week: 2.0 standard drinks of alcohol    Types: 2 Cans of beer per week    Comment: rare   Drug use: Not Currently    Comment: rare     Allergies   Patient has no known allergies.   Review of Systems Review of Systems  Respiratory:  Positive for shortness of breath.   Per HPI   Physical Exam Triage Vital Signs ED Triage Vitals [06/08/23 1832]  Encounter Vitals Group     BP (!) 193/135     Systolic BP Percentile      Diastolic BP Percentile       Pulse Rate 95     Resp (!) 32     Temp      Temp Source Oral     SpO2 94 %     Weight      Height      Head Circumference      Peak Flow      Pain Score  Pain Loc      Pain Education      Exclude from Growth Chart    No data found.  Updated Vital Signs BP (!) 185/118 (BP Location: Right Arm)   Pulse 75   Resp (!) 22   SpO2 92%   Visual Acuity Right Eye Distance:   Left Eye Distance:   Bilateral Distance:    Right Eye Near:   Left Eye Near:    Bilateral Near:     Physical Exam Vitals and nursing note reviewed.  Constitutional:      Appearance: He is not ill-appearing or toxic-appearing.  HENT:     Head: Normocephalic and atraumatic.     Right Ear: Hearing and external ear normal.     Left Ear: Hearing and external ear normal.     Nose: Nose normal.     Mouth/Throat:     Lips: Pink.  Eyes:     General: Lids are normal. Vision grossly intact. Gaze aligned appropriately.     Extraocular Movements: Extraocular movements intact.     Conjunctiva/sclera: Conjunctivae normal.  Cardiovascular:     Rate and Rhythm: Normal rate and regular rhythm.     Heart sounds: S1 normal and S2 normal. Murmur (baseline) heard.  Pulmonary:     Effort: Tachypnea, accessory muscle usage and respiratory distress present.     Breath sounds: Normal air entry. Decreased breath sounds and wheezing (Inspiratory and expiratory wheezing heard to all lung fields bilaterally throughout chest on initial exam.) present. No rhonchi or rales.     Comments: Initially speaking in short sentences with moderate difficulty and prolonged expiration. Wheezing heard throughout chest with diminished breath sounds.  Musculoskeletal:     Cervical back: Neck supple.     Right lower leg: No tenderness. No edema.     Left lower leg: No tenderness. No edema.  Skin:    General: Skin is warm and dry.     Capillary Refill: Capillary refill takes less than 2 seconds.     Findings: No rash.  Neurological:      General: No focal deficit present.     Mental Status: He is alert and oriented to person, place, and time. Mental status is at baseline.     Cranial Nerves: No dysarthria or facial asymmetry.  Psychiatric:        Mood and Affect: Mood normal.        Speech: Speech normal.        Behavior: Behavior normal.        Thought Content: Thought content normal.        Judgment: Judgment normal.      UC Treatments / Results  Labs (all labs ordered are listed, but only abnormal results are displayed) Labs Reviewed - No data to display  EKG   Radiology No results found.  Procedures Procedures (including critical care time)  Medications Ordered in UC Medications  ipratropium-albuterol  (DUONEB) 0.5-2.5 (3) MG/3ML nebulizer solution 3 mL (3 mLs Nebulization Given 06/08/23 1844)  albuterol  (PROVENTIL ) (2.5 MG/3ML) 0.083% nebulizer solution 2.5 mg (2.5 mg Nebulization Given 06/08/23 1844)  dexamethasone  (DECADRON ) injection 10 mg (10 mg Intramuscular Given 06/08/23 1854)    Initial Impression / Assessment and Plan / UC Course  I have reviewed the triage vital signs and the nursing notes.  Pertinent labs & imaging results that were available during my care of the patient were reviewed by me and considered in my medical decision making (see chart for details).  1. COPD with acute exacerbation, shortness of breath Patient with complex medical history presents with findings consistent with COPD exacerbation.  No new oxygen requirement, vital signs are to baseline. Upon chart review, it appears he has not been taking his BP medications as prescribed for a long  time and BP is consistently elevated. No signs of stroke. Discussed risks of sustained elevated BP. Neurologically intact to baseline currently. EKG shows NSR with LVH and is similar to previous EKG from 06/05/23 and without signs of ST/T wave changes.   BP Readings from Last 3 Encounters:  06/08/23 (!) 185/118  06/06/23 (!) 184/118   06/05/23 (!) 154/88   He is stable for outpatient treatment of COPD exacerbation. DuoNeb and albuterol  significantly improved lung sounds and subjective shortness of breath on re-check. Lungs clear, wheezing entirely resolved on re-check. Oxygen stable at 92-93% on room air, RR decreased to 20-22. Speaking in full sentences without difficulty on re-assessment. He reports his breathing is back to normal after 2 breathing treatments.  Dexamethasone  10mg  IM given. May start prednisone  burst tomorrow. Azithromycin  ordered to cover for atypical bacterial infection. Advised to use duoneb every 4-6 hours scheduled for 24 hours, then as needed for shortness of breath and wheezing with home nebulizer.  Patient declines chest x-ray today stating he "just had one in the ER". Reviewed recent ER visit showing normal CXR without acute finding. I am agreeable with this today. Low suspicion for pneumonia, CHF exacerbation, pleural effusion, pneumothorax, etc.   Strict ER return precautions discussed should symptoms worsen overnight.  Advised to call pulmonology and let them know of COPD exacerbation since he is scheduled to have a bronchoscopy in 3 days on June 11, 2023.   Counseled patient on potential for adverse effects with medications prescribed/recommended today, strict ER and return-to-clinic precautions discussed, patient verbalized understanding.    Final Clinical Impressions(s) / UC Diagnoses   Final diagnoses:  COPD with acute exacerbation (HCC)  Shortness of breath     Discharge Instructions      Your symptoms are due to COPD exacerbation.  - Use duoneb every 4-6 hours as needed for cough, shortness of breath, and wheeze. - Take antibiotic as prescribed to treat potential infection to the lungs. - Take the steroid sent to the pharmacy as directed to help reduce lung inflammation and decrease the risk of another attack in the next few days. No NSAIDs like ibuprofen  or aleve/naproxen  while taking steroid. Take with food to avoid stomach upset.  If your symptoms do not improve in the next 2-3 days with interventions, please return. Please seek medical care for new or returning symptoms, such as difficulty breathing that doesn't improve with your medications, chest pain, voice changes, high fevers, confusion, or other new or worsening symptoms. Follow-up with PCP for ongoing management of COPD.      ED Prescriptions     Medication Sig Dispense Auth. Provider   predniSONE  (DELTASONE ) 20 MG tablet Take 2 tablets (40 mg total) by mouth daily for 5 days. 10 tablet Shella Devoid M, FNP   ipratropium-albuterol  (DUONEB) 0.5-2.5 (3) MG/3ML SOLN Take 3 mLs by nebulization every 4 (four) hours as needed. 360 mL Shella Devoid M, FNP   azithromycin  (ZITHROMAX ) 250 MG tablet Take 1 tablet (250 mg total) by mouth daily. Take first 2 tablets together, then 1 every day until finished. 6 tablet Starlene Eaton, FNP      PDMP not reviewed this encounter.   Starlene Eaton, [place] 06/08/23  2150    Starlene Eaton, [place] 06/08/23 2153

## 2023-06-08 NOTE — Progress Notes (Signed)
 PCP - Dr Kasandra Pain Cardiologist - Dr Dinah Franco Infectious Diseases - Dr Deberah [place] Pulmonology - Dr Rosa College  Chest x-ray - 06/05/23 EKG - 06/05/23 Stress Test - 09/26/22 ECHO - 12/27/21 Cardiac Cath - 10/23/16  ICD Pacemaker/Loop - n/a  Sleep Study -  Yes (2021) CPAP - does not use CPAP  Diabetes - n/a  Blood Thinner Instructions:  n/a  Aspirin  Instructions: Follow your surgeon's instructions on when to stop aspirin  prior to surgery,  If no instructions were given by your surgeon then you will need to call the office for those instructions.  NPO   Anesthesia review: Yes  STOP now taking any Aspirin  (unless otherwise instructed by your surgeon), Aleve, Naproxen, Ibuprofen , Motrin , Advil , Goody's, BC's, all herbal medications, fish oil, and all vitamins.   Coronavirus Screening Do you have any of the following symptoms:  Cough yes/no: No Fever (>100.64F)  yes/no: No Runny nose yes/no: No Sore throat yes/no: No Difficulty breathing/shortness of breath  yes/no: Yes, COPD  Have you traveled in the last 14 days and where? yes/no: No  Patient and Daughter Gillian Lacrosse verbalized understanding of instructions that were given via speaker phone.

## 2023-06-08 NOTE — Discharge Instructions (Signed)
 Your symptoms are due to COPD exacerbation.  - Use duoneb every 4-6 hours as needed for cough, shortness of breath, and wheeze. - Take antibiotic as prescribed to treat potential infection to the lungs. - Take the steroid sent to the pharmacy as directed to help reduce lung inflammation and decrease the risk of another attack in the next few days. No NSAIDs like ibuprofen  or aleve/naproxen while taking steroid. Take with food to avoid stomach upset.  If your symptoms do not improve in the next 2-3 days with interventions, please return. Please seek medical care for new or returning symptoms, such as difficulty breathing that doesn't improve with your medications, chest pain, voice changes, high fevers, confusion, or other new or worsening symptoms. Follow-up with PCP for ongoing management of COPD.

## 2023-06-08 NOTE — ED Triage Notes (Signed)
 Pt states he is SOB and having a COPD flare up. He hasn't taken any of his meds today.

## 2023-06-09 ENCOUNTER — Other Ambulatory Visit (HOSPITAL_COMMUNITY): Payer: Self-pay

## 2023-06-11 ENCOUNTER — Encounter (HOSPITAL_COMMUNITY)
Admission: RE | Disposition: A | Discharge status: Home / Self Care | Payer: Self-pay | Source: Home / Self Care | Attending: Emergency Medicine

## 2023-06-11 ENCOUNTER — Ambulatory Visit (HOSPITAL_COMMUNITY): Payer: Self-pay | Admitting: Physician Assistant

## 2023-06-11 ENCOUNTER — Ambulatory Visit (HOSPITAL_COMMUNITY)
Admission: RE | Admit: 2023-06-11 | Discharge: 2023-06-11 | Disposition: A | Discharge status: Home / Self Care | Attending: Emergency Medicine | Admitting: Emergency Medicine

## 2023-06-11 ENCOUNTER — Encounter (HOSPITAL_COMMUNITY): Payer: Self-pay | Admitting: Emergency Medicine

## 2023-06-11 ENCOUNTER — Ambulatory Visit (HOSPITAL_COMMUNITY)

## 2023-06-11 ENCOUNTER — Ambulatory Visit (HOSPITAL_BASED_OUTPATIENT_CLINIC_OR_DEPARTMENT_OTHER): Payer: Self-pay | Admitting: Physician Assistant

## 2023-06-11 DIAGNOSIS — Z7982 Long term (current) use of aspirin: Secondary | ICD-10-CM | POA: Insufficient documentation

## 2023-06-11 DIAGNOSIS — Z85528 Personal history of other malignant neoplasm of kidney: Secondary | ICD-10-CM | POA: Diagnosis not present

## 2023-06-11 DIAGNOSIS — I5032 Chronic diastolic (congestive) heart failure: Secondary | ICD-10-CM | POA: Diagnosis not present

## 2023-06-11 DIAGNOSIS — I130 Hypertensive heart and chronic kidney disease with heart failure and stage 1 through stage 4 chronic kidney disease, or unspecified chronic kidney disease: Secondary | ICD-10-CM | POA: Insufficient documentation

## 2023-06-11 DIAGNOSIS — F419 Anxiety disorder, unspecified: Secondary | ICD-10-CM | POA: Insufficient documentation

## 2023-06-11 DIAGNOSIS — B20 Human immunodeficiency virus [HIV] disease: Secondary | ICD-10-CM | POA: Diagnosis not present

## 2023-06-11 DIAGNOSIS — R0989 Other specified symptoms and signs involving the circulatory and respiratory systems: Secondary | ICD-10-CM | POA: Diagnosis not present

## 2023-06-11 DIAGNOSIS — I110 Hypertensive heart disease with heart failure: Secondary | ICD-10-CM | POA: Diagnosis not present

## 2023-06-11 DIAGNOSIS — R911 Solitary pulmonary nodule: Secondary | ICD-10-CM | POA: Diagnosis not present

## 2023-06-11 DIAGNOSIS — R846 Abnormal cytological findings in specimens from respiratory organs and thorax: Secondary | ICD-10-CM | POA: Diagnosis not present

## 2023-06-11 DIAGNOSIS — I509 Heart failure, unspecified: Secondary | ICD-10-CM | POA: Diagnosis not present

## 2023-06-11 DIAGNOSIS — I739 Peripheral vascular disease, unspecified: Secondary | ICD-10-CM | POA: Diagnosis not present

## 2023-06-11 DIAGNOSIS — F17290 Nicotine dependence, other tobacco product, uncomplicated: Secondary | ICD-10-CM | POA: Insufficient documentation

## 2023-06-11 DIAGNOSIS — F17210 Nicotine dependence, cigarettes, uncomplicated: Secondary | ICD-10-CM | POA: Insufficient documentation

## 2023-06-11 DIAGNOSIS — K219 Gastro-esophageal reflux disease without esophagitis: Secondary | ICD-10-CM | POA: Diagnosis not present

## 2023-06-11 DIAGNOSIS — I2510 Atherosclerotic heart disease of native coronary artery without angina pectoris: Secondary | ICD-10-CM | POA: Insufficient documentation

## 2023-06-11 DIAGNOSIS — Z79899 Other long term (current) drug therapy: Secondary | ICD-10-CM | POA: Insufficient documentation

## 2023-06-11 DIAGNOSIS — I480 Paroxysmal atrial fibrillation: Secondary | ICD-10-CM | POA: Diagnosis not present

## 2023-06-11 DIAGNOSIS — C3431 Malignant neoplasm of lower lobe, right bronchus or lung: Secondary | ICD-10-CM | POA: Diagnosis not present

## 2023-06-11 DIAGNOSIS — R9389 Abnormal findings on diagnostic imaging of other specified body structures: Secondary | ICD-10-CM | POA: Diagnosis not present

## 2023-06-11 DIAGNOSIS — R918 Other nonspecific abnormal finding of lung field: Secondary | ICD-10-CM | POA: Diagnosis not present

## 2023-06-11 DIAGNOSIS — R519 Headache, unspecified: Secondary | ICD-10-CM | POA: Insufficient documentation

## 2023-06-11 DIAGNOSIS — Z91199 Patient's noncompliance with other medical treatment and regimen due to unspecified reason: Secondary | ICD-10-CM | POA: Diagnosis not present

## 2023-06-11 DIAGNOSIS — Z48813 Encounter for surgical aftercare following surgery on the respiratory system: Secondary | ICD-10-CM | POA: Diagnosis not present

## 2023-06-11 DIAGNOSIS — N1832 Chronic kidney disease, stage 3b: Secondary | ICD-10-CM | POA: Diagnosis not present

## 2023-06-11 DIAGNOSIS — J439 Emphysema, unspecified: Secondary | ICD-10-CM | POA: Insufficient documentation

## 2023-06-11 DIAGNOSIS — G4733 Obstructive sleep apnea (adult) (pediatric): Secondary | ICD-10-CM | POA: Diagnosis not present

## 2023-06-11 HISTORY — PX: BRONCHIAL NEEDLE ASPIRATION BIOPSY: SHX5106

## 2023-06-11 HISTORY — PX: BRONCHIAL BRUSHINGS: SHX5108

## 2023-06-11 HISTORY — PX: BRONCHIAL WASHINGS: SHX5105

## 2023-06-11 HISTORY — DX: Dyspnea, unspecified: R06.00

## 2023-06-11 HISTORY — DX: Other psychoactive substance abuse, uncomplicated: F19.10

## 2023-06-11 HISTORY — PX: VIDEO BRONCHOSCOPY WITH ENDOBRONCHIAL NAVIGATION: SHX6175

## 2023-06-11 HISTORY — PX: BRONCHIAL BIOPSY: SHX5109

## 2023-06-11 SURGERY — VIDEO BRONCHOSCOPY WITH ENDOBRONCHIAL NAVIGATION
Anesthesia: General | Laterality: Right

## 2023-06-11 MED ORDER — SODIUM CHLORIDE 0.9 % IV SOLN: INTRAVENOUS | Status: DC

## 2023-06-11 MED ORDER — DEXAMETHASONE SODIUM PHOSPHATE 10 MG/ML IJ SOLN
INTRAMUSCULAR | Status: DC | PRN
  Administered 2023-06-11: 10 mg via INTRAVENOUS

## 2023-06-11 MED ORDER — ONDANSETRON HCL 4 MG/2ML IJ SOLN
INTRAMUSCULAR | Status: DC | PRN
  Administered 2023-06-11: 4 mg via INTRAVENOUS

## 2023-06-11 MED ORDER — CHLORHEXIDINE GLUCONATE 0.12 % MT SOLN
OROMUCOSAL | Status: AC
  Administered 2023-06-11: 15 mL via OROMUCOSAL
  Filled 2023-06-11: qty 15

## 2023-06-11 MED ORDER — CHLORHEXIDINE GLUCONATE 0.12 % MT SOLN: 15.0000 mL | Freq: Once | OROMUCOSAL | Status: AC

## 2023-06-11 MED ORDER — PHENYLEPHRINE HCL-NACL 20-0.9 MG/250ML-% IV SOLN
INTRAVENOUS | Status: DC | PRN
  Administered 2023-06-11: 40 ug/min via INTRAVENOUS

## 2023-06-11 MED ORDER — LIDOCAINE 2% (20 MG/ML) 5 ML SYRINGE
INTRAMUSCULAR | Status: DC | PRN
  Administered 2023-06-11: 60 mg via INTRAVENOUS

## 2023-06-11 MED ORDER — BUSPIRONE HCL 7.5 MG PO TABS: 7.5000 mg | ORAL_TABLET | Freq: Two times a day (BID) | ORAL | Status: DC

## 2023-06-11 MED ORDER — PROPOFOL 500 MG/50ML IV EMUL
INTRAVENOUS | Status: DC | PRN
  Administered 2023-06-11: 125 ug/kg/min via INTRAVENOUS

## 2023-06-11 MED ORDER — ACETAMINOPHEN 500 MG PO TABS
1000.0000 mg | ORAL_TABLET | Freq: Once | ORAL | Status: DC
Start: 2023-06-11 — End: 2023-06-11
  Filled 2023-06-11: qty 2

## 2023-06-11 MED ORDER — SUGAMMADEX SODIUM 200 MG/2ML IV SOLN
INTRAVENOUS | Status: DC | PRN
  Administered 2023-06-11: 200 mg via INTRAVENOUS

## 2023-06-11 MED ORDER — PROPOFOL 10 MG/ML IV BOLUS
INTRAVENOUS | Status: DC | PRN
  Administered 2023-06-11: 200 mg via INTRAVENOUS

## 2023-06-11 MED ORDER — ROCURONIUM BROMIDE 10 MG/ML (PF) SYRINGE
PREFILLED_SYRINGE | INTRAVENOUS | Status: DC | PRN
  Administered 2023-06-11: 70 mg via INTRAVENOUS

## 2023-06-11 NOTE — Discharge Instructions (Addendum)
 Flexible Bronchoscopy, Care After This sheet gives you information about how to care for yourself after your test. Your doctor may also give you more specific instructions. If you have problems or questions, contact your doctor. Follow these instructions at home: Eating and drinking When you are wide awake, your numbness is gone and your cough and gag reflexes have come back, you may: Start eating only soft foods. Slowly drink liquids. Six hours after the test, go back to your normal diet. Driving Do not drive for 24 hours if you were given a medicine to help you relax (sedative). Do not drive or use heavy machinery while taking prescription pain medicine. General instructions Take over-the-counter and prescription medicines only as told by your doctor. Return to your normal activities as told. Ask what activities are safe for you. Do not use any products that have nicotine or tobacco in them. This includes cigarettes and e-cigarettes. If you need help quitting, ask your doctor. Keep all follow-up visits as told by your doctor. This is important. It is very important if you had a tissue sample (biopsy) taken. Get help right away if: You have shortness of breath that gets worse. You get light-headed. You feel like you are going to pass out (faint). You have chest pain. You cough up: More than a little blood. More blood than before. Summary Do not use cigarettes. Do not use e-cigarettes. Seek care in the Emergency Department right away if you have chest pain or shortness of breath. Call or MyChart Message our office for any questions or problems at [phone].    This information is not intended to replace advice given to you by your health care provider. Make sure you discuss any questions you have with your health care provider.

## 2023-06-11 NOTE — Interval H&P Note (Signed)
 History and Physical Interval Note:  06/11/2023 11:18 AM  Kevin [name]  has presented today for surgery, with the diagnosis of RIGHT LOWER LOBE LUNG NODULE.  The various methods of treatment have been discussed with the patient and family. After consideration of risks, benefits and other options for treatment, the patient has consented to  Procedure(s) with comments: BRONCHOSCOPY, WITH BIOPSY USING ELECTROMAGNETIC NAVIGATION (Right) - WITH FLUORO AND BIOPSY as a surgical intervention.  The patient's history has been reviewed, patient examined, no change in status, stable for surgery.  I have reviewed the patient's chart and labs.  Questions were answered to the patient's satisfaction.     [name]

## 2023-06-11 NOTE — Op Note (Addendum)
 Video Bronchoscopy with Robotic Assisted Bronchoscopic Navigation   Date of Operation: 06/11/2023   Pre-op  Diagnosis: Right lower lobe groundglass nodule  Post-op Diagnosis: Same  Surgeon: Racheal Buddle  Assistants: None  Anesthesia: General endotracheal anesthesia  Operation: Flexible video fiberoptic bronchoscopy with robotic assistance and biopsies.  Estimated Blood Loss: Minimal  Complications: None  Indications and History: Kevin [name] is a 68 y.o. male with history of tobacco use, renal cell cancer (postablation) and pulmonary nodules on CT scan of the chest that have been followed with serial imaging.  He has a groundglass right lower lobe pulmonary nodule that has changed in size slightly and has increased density compared with prior imaging..  Recommendation made to achieve a tissue diagnosis via robotic assisted navigational bronchoscopy.  The risks, benefits, complications, treatment options and expected outcomes were discussed with the patient.  The possibilities of pneumothorax, pneumonia, reaction to medication, pulmonary aspiration, perforation of a viscus, bleeding, failure to diagnose a condition and creating a complication requiring transfusion or operation were discussed with the patient who freely signed the consent.    Description of Procedure: The patient was seen in the Preoperative Area, was examined and was deemed appropriate to proceed.  The patient was taken to [facility] Endoscopy room 3, identified as Kevin [name] and the procedure verified as Flexible Video Fiberoptic Bronchoscopy.  A Time Out was held and the above information confirmed.   Prior to the date of the procedure a high-resolution CT scan of the chest was performed. Utilizing ION software program a virtual tracheobronchial tree was generated to allow the creation of distinct navigation pathways to the patient's parenchymal abnormalities. After being taken to the operating room general anesthesia was  initiated and the patient  was orally intubated. The video fiberoptic bronchoscope was introduced via the endotracheal tube and a general inspection was performed which showed normal right and left lung anatomy. Aspiration of the bilateral mainstems was completed to remove any remaining secretions. Robotic catheter inserted into patient's endotracheal tube.   Target #1 right lower lobe nodule: The distinct navigation pathways prepared prior to this procedure were then utilized to navigate to patient's lesion identified on CT scan. The robotic catheter was secured into place and the vision probe was withdrawn.  Lesion location was approximated using fluoroscopy.  Local registration and targeting was performed using Siem[employer] Cios mobile C-arm three-dimensional imaging. Under fluoroscopic guidance transbronchial needle brushings, transbronchial needle biopsies, and transbronchial forceps biopsies were performed to be sent for cytology and pathology. A bronchioalveolar lavage was performed in the right lower lobe adjacent to the nodule and sent for cytology and microbiology.    At the end of the procedure a general airway inspection was performed and there was no evidence of active bleeding. The bronchoscope was removed.  The patient tolerated the procedure well. There was no significant blood loss and there were no obvious complications. A post-procedural chest x-ray is pending.  Samples Target #1: 1. Transbronchial needle brushings from right lower lobe nodule 2. Transbronchial Wang needle biopsies from right lower lobe nodule 3. Transbronchial forceps biopsies from right lower lobe nodule 4.  Bronchoalveolar lavage from the right lower lobe   Plans:  The patient will be discharged from the PACU to home when recovered from anesthesia and after chest x-ray is reviewed. We will review the cytology, pathology and microbiology results with the patient when they become available. Outpatient  followup will be with Braden Caddy, NP and Dr. Linder Revere.    [name]  [name], MD, PhD 06/11/2023, 1:47 PM [facility] [phone] or if no answer before 7:00PM call [phone] For any issues after 7:00PM please call eLink [phone]

## 2023-06-11 NOTE — Anesthesia Postprocedure Evaluation (Signed)
 Anesthesia Post Note  Patient: Kevin [name]  Procedure(s) Performed: VIDEO BRONCHOSCOPY WITH ENDOBRONCHIAL NAVIGATION (Right) BRONCHOSCOPY, WITH NEEDLE ASPIRATION BIOPSY BRONCHOSCOPY, WITH BRUSH BIOPSY BRONCHOSCOPY, WITH BIOPSY IRRIGATION, BRONCHUS     Patient location during evaluation: PACU Anesthesia Type: General Level of consciousness: awake and alert, oriented and patient cooperative Pain management: pain level controlled Vital Signs Assessment: post-procedure vital signs reviewed and stable Respiratory status: spontaneous breathing, nonlabored ventilation and respiratory function stable Cardiovascular status: blood pressure returned to baseline and stable Postop Assessment: no apparent nausea or vomiting Anesthetic complications: no   No notable events documented.  Last Vitals:  Vitals:   06/11/23 1410 06/11/23 1420  BP: 127/88 (!) 142/74  Pulse: (!) 59 60  Resp: 16 16  Temp:    SpO2: 97% 97%    Last Pain:  Vitals:   06/11/23 1420  TempSrc:   PainSc: 0-No pain                 Jacquelyne Matte

## 2023-06-11 NOTE — Transfer of Care (Signed)
 Immediate Anesthesia Transfer of Care Note  Patient: Kevin [name]  Procedure(s) Performed: VIDEO BRONCHOSCOPY WITH ENDOBRONCHIAL NAVIGATION (Right) BRONCHOSCOPY, WITH NEEDLE ASPIRATION BIOPSY BRONCHOSCOPY, WITH BRUSH BIOPSY BRONCHOSCOPY, WITH BIOPSY IRRIGATION, BRONCHUS  Patient Location: PACU  Anesthesia Type:General  Level of Consciousness: awake and alert   Airway & Oxygen Therapy: Patient Spontanous Breathing  Post-op Assessment: Report given to RN  Post vital signs: Reviewed and stable  Last Vitals:  Vitals Value Taken Time  BP 153/77 06/11/23 1353  Temp    Pulse 65 06/11/23 1354  Resp 15 06/11/23 1354  SpO2 97 % 06/11/23 1354  Vitals shown include unfiled device data.  Last Pain:  Vitals:   06/11/23 1109  TempSrc:   PainSc: 0-No pain         Complications: No notable events documented.

## 2023-06-11 NOTE — Anesthesia Procedure Notes (Signed)
 Procedure Name: Intubation Date/Time: 06/11/2023 12:42 PM  Performed by: Hershall Lory, CRNAPre-anesthesia Checklist: Patient identified, Emergency Drugs available, Suction available and Patient being monitored Patient Re-evaluated:Patient Re-evaluated prior to induction Oxygen Delivery Method: Circle system utilized Preoxygenation: Pre-oxygenation with 100% oxygen Induction Type: IV induction Ventilation: Mask ventilation without difficulty Laryngoscope Size: Mac and 3 Grade View: Grade II Tube type: Oral Tube size: 8.5 mm Number of attempts: 1 Airway Equipment and Method: Stylet and Oral airway Placement Confirmation: ETT inserted through vocal cords under direct vision, positive ETCO2 and breath sounds checked- equal and bilateral Secured at: 21 cm Tube secured with: Tape Dental Injury: Teeth and Oropharynx as per pre-operative assessment

## 2023-06-13 ENCOUNTER — Encounter (HOSPITAL_COMMUNITY): Payer: Self-pay | Admitting: Emergency Medicine

## 2023-06-13 LAB — ACID FAST SMEAR (AFB, MYCOBACTERIA): Acid Fast Smear: NEGATIVE

## 2023-06-14 LAB — CULTURE, BAL-QUANTITATIVE W GRAM STAIN: Gram Stain: NONE SEEN

## 2023-06-14 LAB — CYTOLOGY - NON PAP

## 2023-06-15 ENCOUNTER — Ambulatory Visit: Admitting: Acute Care

## 2023-06-15 LAB — CYTOLOGY - NON PAP

## 2023-06-17 LAB — ANAEROBIC CULTURE W GRAM STAIN: Gram Stain: NONE SEEN

## 2023-06-18 ENCOUNTER — Other Ambulatory Visit: Payer: Self-pay

## 2023-06-19 ENCOUNTER — Ambulatory Visit: Admitting: Acute Care

## 2023-06-19 ENCOUNTER — Encounter (HOSPITAL_BASED_OUTPATIENT_CLINIC_OR_DEPARTMENT_OTHER)

## 2023-06-20 ENCOUNTER — Ambulatory Visit: Admitting: Acute Care

## 2023-06-20 ENCOUNTER — Encounter: Payer: Self-pay | Admitting: Acute Care

## 2023-06-20 VITALS — BP 146/89 | HR 63 | Ht 73.0 in | Wt 162.4 lb

## 2023-06-20 DIAGNOSIS — G9009 Other idiopathic peripheral autonomic neuropathy: Secondary | ICD-10-CM

## 2023-06-20 DIAGNOSIS — C3491 Malignant neoplasm of unspecified part of right bronchus or lung: Secondary | ICD-10-CM | POA: Diagnosis not present

## 2023-06-20 DIAGNOSIS — Z9889 Other specified postprocedural states: Secondary | ICD-10-CM

## 2023-06-20 DIAGNOSIS — F17210 Nicotine dependence, cigarettes, uncomplicated: Secondary | ICD-10-CM

## 2023-06-20 NOTE — Patient Instructions (Addendum)
 It is good to see you today.  I am glad you have done well since your procedure. We have reviewed your biopsy results. 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 We need to do a PET scan to complete staging of the cancer. I have placed a consult to medical oncology. You will get a call to get this scheduled. I have placed a consult to radiation oncology. You will get a call to get this scheduled as well. The cancer team will take great care of you. I have also placed a referral to podiatry for your foot pain and for your request for better foot care It is possible that the pain you are having in your feet is something called neuropathies that can be caused by several different things. I also recommend following up with your primary care physician to have this evaluated. Please call us  if you have any questions or concerns. Good luck with your treatment Please contact office for sooner follow up if symptoms do not improve or worsen or seek emergency care

## 2023-06-20 NOTE — Progress Notes (Addendum)
 History of Present Illness Kevin [name] is a 68 y.o. male current everyday smoker followed by Dr. Shelah for a pulmonary nodule.  Synopsis 68 year old patient current everyday smoker with a history of tobacco use (40+ pk-yrs) and COPD, renal cell cancer, HIV on therapy, hypertension and atrial fibrillation with diastolic CHF, OSA on CPAP, gout.  He has been seen by Dr. Gladis in our office for his COPD and also pulmonary nodular disease on a CT chest 11/2020.  He is also followed by Dr. Quita Salt.    CT scan of the chest 09/06/2022 shows multiple stable right sided solid and subsolid pulmonary nodules that were stable or regressed.  2 nodules had increased in size including a 7 mm solid right upper lobe nodule, 15 mm mixed density right lower lobe nodule.  This prompted PET scan. He has been experiencing episodes of acute dyspnea, has been back and forth to the ED multiple times this year. Was started on Trelegy in June 2024. Dyspnea  occurs w exertion, chores or at rest. It Does not wake him from sleep. He coughs through the day, produces mucous in the am. ? Whether there may be a component of panic / anxiety. Albuterol  nebs 3x a day. Unclear triggers. Does have trouble in hot  and humid air.    PET scan 09/21/2022  showed mild hypermetabolism within the mediastinal nodes with SUV max 4.1 of unclear significance.  The right upper lobe peribronchovascular nodules measured at 5 mm.  None of the groundglass nodules had any hypermetabolism.  There was some nonspecific hypermetabolism in the right apex as well as inferior lingula and an area of some minimal airspace disease that was new compared with the CT done 7/24. Pt. Opted for careful monitoring,  and a 6 month follow up scan was scheduled for 02/2023. That scan showed stable nodules, so a 3 month scan was ordered for continued surveillance. Multiple Pulmonary Nodules with slowly growing solid component Mild hypermetabolism within mediastinal and right  hilar Nodes 09/21/2022.He underwent bronch with biopsies 06/11/2023.He is here today to review the results of cytology.   06/20/2023 Kevin [name] is a 68 year old male presenting for follow-up after bronchoscopy with biopsies on 06/11/2023.  He states he has been doing well.    He underwent a fine needle aspiration of the right lower lobe on 06/11/2023.  We reviewed the results of his biopsies which confirmed non-small cell lung cancer, specifically adenocarcinoma in the right lower lobe. Post-procedure, he experienced light bleeding for a day, which resolved spontaneously. No fever, discolored secretions, changes in breathing, or anesthesia-related issues were noted.  We discussed options for treatment. I have ordered a PET scan to complete staging. Last PET was done 09/2022. This showed Enlarging solid and subsolid pulmonary nodules on prior CT are without PET correlate, not unexpected given small size and morphology respectively. Recommend surveillance with chest CT at 3-6 months.He has had a CT Brain within the month. I have referred to medical oncology as well as radiation oncology.    Pulmonary function tests from less than a year ago indicated obstruction but good lung volumes, with diffusion capacity normalizing when adjusted for inhaled alveoli.I am unsure if he is a good surgical candidate. He is still smoking. Once PET scan has been completed we can determine if we will send him for surgical evaluation.  He experiences pain in his left toes, described as a sensation of being cut without any visible wound, raising the possibility of  neuropathy. He denies having diabetes and has not discussed this issue with his primary care doctor. I will refer out to podiatry as he has requested.    Test Results: Cytology 06/11/2023  FINAL MICROSCOPIC DIAGNOSIS:  A. LUNG, RLL, FINE NEEDLE ASPIRATION:  - Non-small cell lung carcinoma, see comment  Immunohistochemical stain PAX8 was performed and is  negative.  Morphologic findings are most consistent with lung adenocarcinoma.  Control is adequate.    B. LUNG, RLL, BRUSHING:  - Atypical cells present  - No malignant cells identified      Latest Ref Rng & Units 06/05/2023    3:59 PM 05/15/2023    1:29 PM 03/18/2023    9:34 PM  CBC  WBC 4.0 - 10.5 K/uL 7.7  13.0    Hemoglobin 13.0 - 17.0 g/dL 89.4  87.2  87.7   Hematocrit 39.0 - 52.0 % 31.0  37.0  36.0   Platelets 150 - 400 K/uL 263  335         Latest Ref Rng & Units 06/05/2023    3:59 PM 05/15/2023    1:29 PM 03/19/2023    4:37 AM  BMP  Glucose 70 - 99 mg/dL 891  72  815   BUN 8 - 23 mg/dL 21  24  15    Creatinine 0.61 - 1.24 mg/dL 7.72  7.71  8.31   Sodium 135 - 145 mmol/L 140  139  139   Potassium 3.5 - 5.1 mmol/L 4.3  4.6  4.5   Chloride 98 - 111 mmol/L 110  107  114   CO2 22 - 32 mmol/L 23  18  19    Calcium  8.9 - 10.3 mg/dL 8.6  8.9  8.5     BNP    Component Value Date/Time   BNP 39.9 03/18/2023 2355    ProBNP No results found for: PROBNP  PFT    Component Value Date/Time   FEV1PRE 1.59 11/24/2022 1551   FEV1POST 1.70 11/24/2022 1551   FVCPRE 3.52 11/24/2022 1551   FVCPOST 3.24 11/24/2022 1551   TLC 8.39 11/24/2022 1551   DLCOUNC 19.08 11/24/2022 1551   PREFEV1FVCRT 45 11/24/2022 1551   PSTFEV1FVCRT 53 11/24/2022 1551    DG Chest Port 1 View Result Date: 06/11/2023 CLINICAL DATA:  [id number] S/P bronchoscopy with biopsy [id number] EXAM: PORTABLE CHEST - 1 VIEW COMPARISON:  June 05, 2023, March 18, 2023 FINDINGS: Low lung volumes. Elevation the right hemidiaphragm. Hazy airspace opacity in the right lung base no lobar consolidation, pleural effusion, or pneumothorax. The cardiac silhouette is at the upper limits of normal, likely accentuated by AP technique and low lung volumes. No acute fracture or destructive lesion. Multilevel thoracic osteophytosis. IMPRESSION: Low lung volumes. Hazy airspace opacities, possibly atelectasis or a small amount of  postprocedural alveolar hemorrhage. No pneumothorax. Electronically Signed   By: Rogelia Myers M.D.   On: 06/11/2023 15:10   DG C-ARM BRONCHOSCOPY Result Date: 06/11/2023 C-ARM BRONCHOSCOPY: Fluoroscopy was utilized by the requesting physician.  No radiographic interpretation.   CT CHEST WO CONTRAST Result Date: 06/06/2023 CLINICAL DATA:  Follow-up lung nodules. History of renal cell carcinoma status post ablation. EXAM: CT CHEST WITHOUT CONTRAST TECHNIQUE: Multidetector CT imaging of the chest was performed following the standard protocol without IV contrast. RADIATION DOSE REDUCTION: This exam was performed according to the departmental dose-optimization program which includes automated exposure control, adjustment of the mA and/or kV according to patient size and/or use of iterative reconstruction technique. COMPARISON:  03/07/2023 FINDINGS: Cardiovascular:  Normal heart size. Aortic atherosclerosis. The ascending thoracic aorta measures 4 cm, image 88/6. No pericardial effusion. Coronary artery calcification. Mediastinum/Nodes: Thyroid  gland, trachea, and esophagus appear normal. No enlarged mediastinal or axillary lymph nodes. Lungs/Pleura: The central airways appear patent. There is no pleural fluid, interstitial edema or airspace disease. Emphysema with diffuse bronchial wall thickening. -Index nodules include: Right upper lobe lung nodule measures 5 mm, image 45/5. Stable in the interval. -Part solid nodule within the right lower lobe measures 1.4 cm with internal solid component measuring 5 mm, image 97/5. On the previous exam this measured 1.4 cm with solid component measuring 4 mm. -Unchanged ground-glass attenuating nodule along the proximal minor fissure, image 7/75. Unchanged. Upper Abdomen: No acute abnormality. Musculoskeletal: No chest wall mass or suspicious bone lesions identified. IMPRESSION: 1. No acute findings. 2. Stable appearance of 1.4 cm part solid nodule within the right lower lobe  with internal solid component measuring 5 mm. Low-grade pulmonary neoplasm such as adenocarcinoma is not excluded. 3. Additional lung nodules are unchanged in the interval. 4. Coronary artery calcifications. 5. Aortic Atherosclerosis (ICD10-I70.0) and Emphysema (ICD10-J43.9). Electronically Signed   By: Waddell Calk M.D.   On: 06/06/2023 08:20   CT HEAD WO CONTRAST ( ) Result Date: 06/05/2023 EXAM: CT HEAD WITHOUT 06/05/2023 05:24:56 PM TECHNIQUE: CT of the head was performed without the administration of intravenous contrast. Automated exposure control, iterative reconstruction, and/or weight based adjustment of the mA/kV was utilized to reduce the radiation dose to as low as reasonably achievable. COMPARISON: CT Head dated 10/25/2022. CLINICAL HISTORY: Headache, new onset (Age 52y). C/O - Shortness of Breath; Headache; Hypertension. FINDINGS: BRAIN AND VENTRICLES: There is no acute intracranial hemorrhage, mass effect or midline shift. No abnormal extra-axial fluid collection. The gray-white differentiation is maintained without evidence of an acute infarct. There is mild scattered periventricular and subcortical low density, likely reflecting small vessel ischemic changes. There is no evidence of hydrocephalus. ORBITS: The visualized portion of the orbits demonstrate no acute abnormality. SINUSES: The visualized paranasal sinuses and mastoid air cells demonstrate no acute abnormality. SOFT TISSUES AND SKULL: No acute abnormality of the visualized skull or soft tissues. IMPRESSION: 1. No acute intracranial abnormality. Electronically signed by: Pinkie Pebbles MD 06/05/2023 05:39 PM EDT RP Workstation: [id number]   DG Chest 2 View Result Date: 06/05/2023 CLINICAL DATA:  Chest pain, shortness of breath. EXAM: CHEST - 2 VIEW COMPARISON:  March 18, 2023. FINDINGS: The heart size and mediastinal contours are within normal limits. Both lungs are clear. The visualized skeletal structures are unremarkable.  IMPRESSION: No active cardiopulmonary disease. Electronically Signed   By: Lynwood Landy Raddle M.D.   On: 06/05/2023 15:45     Past medical hx Past Medical History:  Diagnosis Date   AIDS (acquired immune deficiency syndrome) (HCC) 08/17/2016   Amaurosis fugax 08/18/2022   Chronic diastolic CHF (congestive heart failure), NYHA class 3 (HCC) 01/2016   Chronic lower back pain    CKD (chronic kidney disease), stage III (HCC)    stage 3b   COPD (chronic obstructive pulmonary disease) (HCC)    Dyspnea    Gout    forearms, hands, ankles, feet (06/05/2016)   Headache    weekly (06/05/2016)   Heart murmur    never has caused any problems per pt   History of herpes zoster virus 01/2023   Hypertension    Hypertensive crisis 08/15/2017   Lipoma 07/06/2022   Neuromuscular disorder (HCC) 02/05/2023   post herpetic neuralgia    -  Primary   OSA on CPAP    does not use CPAP   PAD (peripheral artery disease) (HCC)    PAF (paroxysmal atrial fibrillation) (HCC) 01/2016   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   Pulmonary nodules 02/2023   Substance abuse (HCC)    in past     Social History   Tobacco Use   Smoking status: Every Day    Current packs/day: 0.10    Average packs/day: 0.1 packs/day for 42.0 years (4.2 ttl pk-yrs)    Types: Cigarettes    Passive exposure: Never   Smokeless tobacco: Never   Tobacco comments:    1-2 cigarettes a day 09/29/22 Tay  Vaping Use   Vaping status: Former   Substances: Nicotine , Engineer, production  Substance Use Topics   Alcohol use: Not Currently    Alcohol/week: 2.0 standard drinks of alcohol    Types: 2 Cans of beer per week    Comment: rare   Drug use: Not Currently    Comment: rare    Mr.Service reports that he has been smoking cigarettes. He has a 4.2 pack-year smoking history. He has never been exposed to tobacco smoke. He has never used smokeless tobacco. He reports that he does not currently use alcohol after a past usage of about 2.0  standard drinks of alcohol per week. He reports that he does not currently use drugs.  Tobacco Cessation: Ready to quit: Not Answered Counseling given: Not Answered Tobacco comments: 1-2 cigarettes a day 09/29/22 Jimmy Current every day smoker, working hard to quit Current every day smoker , I spent 3-4 minutes counseling patient on  steps to stop use of tobacco products. I have provided patient with information on receiving free nicotine  replacement therapy, and contact numbers for hypnosis for smoking cessation as well as acupuncture for smoking cessation.  Past surgical hx, Family hx, Social hx all reviewed.  Current Outpatient Medications on File Prior to Visit  Medication Sig   albuterol  (VENTOLIN  HFA) 108 (90 Base) MCG/ACT inhaler Inhale 1-2 puffs into the lungs every 6 (six) hours as needed for wheezing or shortness of breath.   albuterol  (VENTOLIN  HFA) 108 (90 Base) MCG/ACT inhaler Inhale 2-4 puffs into the lungs every 3 (three) hours as needed for wheezing or shortness of breath.   amLODipine -valsartan  (EXFORGE ) 10-320 MG tablet Take 1 tablet by mouth daily.   aspirin  EC (ASPIRIN  LOW DOSE) 81 MG tablet Take 1 tablet (81 mg total) by mouth daily, swallow whole   Aspirin -Salicylamide-Caffeine (BC HEADACHE POWDER PO) Take 1 packet by mouth every 8 (eight) hours as needed (for headaches).   atorvastatin  (LIPITOR) 80 MG tablet Take 1 tablet (80 mg total) by mouth daily.   azithromycin  (ZITHROMAX ) 250 MG tablet Take 1 tablet (250 mg total) by mouth daily. Take first 2 tablets together, then 1 every day until finished.   busPIRone  (BUSPAR ) 7.5 MG tablet Take 1 tablet (7.5 mg total) by mouth in the morning and at bedtime.   dolutegravir -lamiVUDine  (DOVATO ) 50-300 MG tablet Take 1 tablet by mouth daily.   Fluticasone -Umeclidin-Vilant (TRELEGY ELLIPTA ) 200-62.5-25 MCG/ACT AEPB Inhale 1 puff into the lungs daily.   Fluticasone -Umeclidin-Vilant (TRELEGY ELLIPTA ) 200-62.5-25 MCG/ACT AEPB Inhale 1 puff  into the lungs daily at 2 PM.   ipratropium-albuterol  (DUONEB) 0.5-2.5 (3) MG/3ML SOLN Take 3 mLs by nebulization every 6 (six) hours as needed. (Patient taking differently: Take 3 mLs by nebulization every 6 (six) hours as needed (for shortness of breath or wheezing).)   ipratropium-albuterol  (DUONEB) 0.5-2.5 (  3) MG/3ML SOLN Take 3 mLs by nebulization every 4 (four) hours as needed.   montelukast  (SINGULAIR ) 10 MG tablet Take 1 tablet (10 mg total) by mouth at bedtime.   nebivolol  (BYSTOLIC ) 5 MG tablet Take 1 tablet (5 mg total) by mouth daily.   predniSONE  (DELTASONE ) 5 MG tablet Take 8 tablets (40 mg total) by mouth daily for 2 days, THEN 6 tablets (30 mg total) daily for 2 days, THEN 4 tablets (20 mg total) daily for 2 days, THEN 2 tablets (10 mg total) daily for 2 days.   sucralfate  (CARAFATE ) 1 g tablet Take 1 tablet (1 g total) by mouth 4 (four) times daily -  with meals and at bedtime for 14 days.   No current facility-administered medications on file prior to visit.     No Active Allergies  Review Of Systems:  Constitutional:   +  weight loss, No night sweats,  Fevers, chills, fatigue, or +  lassitude.  HEENT:   No headaches,  Difficulty swallowing,  Tooth/dental problems, or  Sore throat,                No sneezing, itching, ear ache, nasal congestion, post nasal drip,   CV:  No chest pain,  Orthopnea, PND, swelling in lower extremities, anasarca, dizziness, palpitations, syncope.   GI  No heartburn, indigestion, abdominal pain, nausea, vomiting, diarrhea, change in bowel habits, loss of appetite, bloody stools.   Resp: + shortness of breath with exertion or at rest.  + excess mucus, + productive cough,  No non-productive cough,  No coughing up of blood.  No change in color of mucus.  + occasional wheezing.  No chest wall deformity  Skin: no rash or lesions.  GU: no dysuria, change in color of urine, no urgency or frequency.  No flank pain, no hematuria   MS:  No joint pain  or swelling.  No decreased range of motion.  No back pain.  Psych:  No change in mood or affect. No depression or anxiety.  No memory loss.Appears depressed   Vital Signs BP (!) 146/89 (BP Location: Left Arm, Patient Position: Sitting, Cuff Size: Normal)   Pulse 63   Ht 6' 1 (1.854 m)   Wt 162 lb 6.4 oz (73.7 kg)   SpO2 98%   BMI 21.43 kg/m    Physical Exam:  General- No distress,  A&Ox3, pleasant , flat affect ENT: No sinus tenderness, TM clear, pale nasal mucosa, no oral exudate,no post nasal drip, no LAN Cardiac: S1, S2, regular rate and rhythm, no murmur Chest: No wheeze/ rales/ dullness; no accessory muscle use, no nasal flaring, no sternal retractions, diminished per bases Abd.: Soft Non-tender, ND, BS +, Body mass index is 21.43 kg/m.  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 We have reviewed your biopsy results. 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 We need to do a PET scan to complete staging of the cancer. I have placed a consult to medical oncology. You will get a call to get  this scheduled. I have placed a consult to radiation oncology. You will get a call to get this scheduled as well. The cancer team will take great care of you. I have also placed a referral to podiatry for your foot pain and for your request for better foot care It is possible that the pain you are having in your feet is something called neuropathies that can be caused by several different things. I also recommend following up  with your primary care physician to have this evaluated. Please call us  if you have any questions or concerns. Good luck with your treatment Please contact office for sooner follow up if symptoms do not improve or worsen or seek emergency care    I spent 35 minutes dedicated to the care of this patient on the date of this encounter to include pre-visit review of records, face-to-face time with the patient discussing conditions above, post visit ordering of testing, clinical documentation with the electronic health record, making appropriate referrals as documented, and communicating necessary information to the patient's healthcare team.   Lauraine JULIANNA Lites, NP 06/20/2023  9:20 AM

## 2023-06-21 ENCOUNTER — Telehealth: Payer: Self-pay | Admitting: Radiation Oncology

## 2023-06-21 ENCOUNTER — Other Ambulatory Visit: Payer: Self-pay

## 2023-06-21 NOTE — Telephone Encounter (Signed)
LVM to schedule CON with Dr. Lisbeth Renshaw

## 2023-06-22 ENCOUNTER — Other Ambulatory Visit: Payer: Self-pay | Admitting: Family Medicine

## 2023-06-22 ENCOUNTER — Other Ambulatory Visit (HOSPITAL_BASED_OUTPATIENT_CLINIC_OR_DEPARTMENT_OTHER): Payer: Self-pay | Admitting: Family Medicine

## 2023-06-22 ENCOUNTER — Other Ambulatory Visit: Payer: Self-pay

## 2023-06-22 DIAGNOSIS — F419 Anxiety disorder, unspecified: Secondary | ICD-10-CM

## 2023-06-22 DIAGNOSIS — I10 Essential (primary) hypertension: Secondary | ICD-10-CM

## 2023-06-22 MED FILL — Montelukast Sodium Tab 10 MG (Base Equiv): ORAL | 30 days supply | Qty: 30 | Fill #5 | Status: CN

## 2023-06-22 NOTE — Progress Notes (Signed)
 The proposed treatment discussed in conference is for discussion purpose only and is not a binding recommendation.  The patients have not been physically examined, or presented with their treatment options.  Therefore, final treatment plans cannot be decided.

## 2023-06-25 ENCOUNTER — Other Ambulatory Visit (HOSPITAL_COMMUNITY): Payer: Self-pay

## 2023-06-26 ENCOUNTER — Telehealth (HOSPITAL_BASED_OUTPATIENT_CLINIC_OR_DEPARTMENT_OTHER): Payer: Self-pay

## 2023-06-26 ENCOUNTER — Other Ambulatory Visit: Payer: Self-pay

## 2023-06-26 ENCOUNTER — Other Ambulatory Visit (HOSPITAL_COMMUNITY): Payer: Self-pay

## 2023-06-26 ENCOUNTER — Telehealth: Payer: Self-pay

## 2023-06-26 DIAGNOSIS — E559 Vitamin D deficiency, unspecified: Secondary | ICD-10-CM | POA: Diagnosis not present

## 2023-06-26 DIAGNOSIS — F419 Anxiety disorder, unspecified: Secondary | ICD-10-CM

## 2023-06-26 DIAGNOSIS — C3491 Malignant neoplasm of unspecified part of right bronchus or lung: Secondary | ICD-10-CM | POA: Diagnosis not present

## 2023-06-26 DIAGNOSIS — I5032 Chronic diastolic (congestive) heart failure: Secondary | ICD-10-CM | POA: Diagnosis not present

## 2023-06-26 DIAGNOSIS — I130 Hypertensive heart and chronic kidney disease with heart failure and stage 1 through stage 4 chronic kidney disease, or unspecified chronic kidney disease: Secondary | ICD-10-CM | POA: Diagnosis not present

## 2023-06-26 DIAGNOSIS — N1832 Chronic kidney disease, stage 3b: Secondary | ICD-10-CM | POA: Diagnosis not present

## 2023-06-26 DIAGNOSIS — Z720 Tobacco use: Secondary | ICD-10-CM | POA: Diagnosis not present

## 2023-06-26 DIAGNOSIS — C649 Malignant neoplasm of unspecified kidney, except renal pelvis: Secondary | ICD-10-CM | POA: Diagnosis not present

## 2023-06-26 DIAGNOSIS — N2581 Secondary hyperparathyroidism of renal origin: Secondary | ICD-10-CM | POA: Diagnosis not present

## 2023-06-26 MED ORDER — VITAMIN D (ERGOCALCIFEROL) 1.25 MG (50000 UNIT) PO CAPS
ORAL_CAPSULE | ORAL | 0 refills | Status: DC
  Filled 2023-06-26: qty 12, 84d supply, fill #0

## 2023-06-26 MED ORDER — BUSPIRONE HCL 7.5 MG PO TABS: 7.5000 mg | ORAL_TABLET | Freq: Two times a day (BID) | ORAL | 0 refills | Status: AC

## 2023-06-26 MED FILL — Montelukast Sodium Tab 10 MG (Base Equiv): ORAL | 30 days supply | Qty: 30 | Fill #5 | Status: CN

## 2023-06-26 NOTE — Telephone Encounter (Signed)
 Called pt at request of NP, call cannot be completed x2- will reattempt contact

## 2023-06-26 NOTE — Progress Notes (Signed)
   06/26/2023  Patient ID: Kevin [name], male   DOB: Dec 16, 1955, 68 y.o.   MRN: [id number]  Message received from [facility] stating they were in need of refills for patient's pantoprazole  20mg , isosorbide  60mg , and buspirone  7.5ng BID; and they have not been successful in contacting provider(s).  Upon reviewing the patient's chart, pantoprazole  20mg  and isosorbide  60mg  were discontinued at patient's most recent hospital discharge the end of April.  Notes only reflect patient was not taking these medications, and he was not taking isosorbide  due to headache caused from it.  It appears buspirone  was sent in by patient's PCP earlier in April for a 90 day supply, and a 30 day was sent out with adherence packaging; but then the hospital provider unintentionally canceled remaining refills at the pharmacy.  Coordinating with Dr. Hildy Lowers for refills on buspirone  and informing cardiology in regard to isosorbide .  Linn Rich, PharmD, DPLA

## 2023-06-26 NOTE — Telephone Encounter (Signed)
-----   Message from Clearnce Curia sent at 06/26/2023  1:17 PM EDT ----- Thanks for the update! Okay to remain off Imdur  (though has had frequent headache since 2018 so I'm not sure the Imdur  really affected it much).   Lisa Rideau - can we call to see how BP is at home? Has been elevated in clinic visit. Please add Hydralazine  25mg  BID. However, if he reports BP is well controlled at home can wait til follow up for medication changes.   Clearnce Curia, NP ----- Message ----- From: Linn Rich, [facility] Sent: 06/26/2023  12:36 PM EDT To: Clearnce Curia, NP  Good afternoon,  Sorry to bother, but [facility] contacted me because they are needing refills on Mr. Alcorn's isosorbide , but it looks like a hospital provider discontinued the medication on 4/28; and the only notes I see reflect that the patient was not taking due to headaches.  I wanted to make you aware in case the office would like to follow-up with the patient and/or refill the isosorbide  or prescribe something different.  Thank you!  Linn Rich, PharmD, DPLA

## 2023-06-27 ENCOUNTER — Other Ambulatory Visit: Payer: Self-pay

## 2023-06-27 ENCOUNTER — Inpatient Hospital Stay: Attending: Internal Medicine | Admitting: Internal Medicine

## 2023-06-27 ENCOUNTER — Ambulatory Visit: Admitting: Podiatrist

## 2023-06-27 ENCOUNTER — Other Ambulatory Visit: Payer: Self-pay | Admitting: Medical Oncology

## 2023-06-27 ENCOUNTER — Inpatient Hospital Stay

## 2023-06-27 VITALS — BP 181/84 | HR 53 | Temp 98.1°F | Resp 17 | Ht 73.0 in | Wt 161.8 lb

## 2023-06-27 DIAGNOSIS — Z85528 Personal history of other malignant neoplasm of kidney: Secondary | ICD-10-CM | POA: Insufficient documentation

## 2023-06-27 DIAGNOSIS — N183 Chronic kidney disease, stage 3 unspecified: Secondary | ICD-10-CM | POA: Insufficient documentation

## 2023-06-27 DIAGNOSIS — Z79624 Long term (current) use of inhibitors of nucleotide synthesis: Secondary | ICD-10-CM | POA: Diagnosis not present

## 2023-06-27 DIAGNOSIS — R918 Other nonspecific abnormal finding of lung field: Secondary | ICD-10-CM

## 2023-06-27 DIAGNOSIS — I480 Paroxysmal atrial fibrillation: Secondary | ICD-10-CM | POA: Diagnosis not present

## 2023-06-27 DIAGNOSIS — Z7982 Long term (current) use of aspirin: Secondary | ICD-10-CM | POA: Diagnosis not present

## 2023-06-27 DIAGNOSIS — Z79899 Other long term (current) drug therapy: Secondary | ICD-10-CM | POA: Insufficient documentation

## 2023-06-27 DIAGNOSIS — C3490 Malignant neoplasm of unspecified part of unspecified bronchus or lung: Secondary | ICD-10-CM

## 2023-06-27 DIAGNOSIS — I130 Hypertensive heart and chronic kidney disease with heart failure and stage 1 through stage 4 chronic kidney disease, or unspecified chronic kidney disease: Secondary | ICD-10-CM | POA: Insufficient documentation

## 2023-06-27 DIAGNOSIS — I5032 Chronic diastolic (congestive) heart failure: Secondary | ICD-10-CM | POA: Insufficient documentation

## 2023-06-27 DIAGNOSIS — B20 Human immunodeficiency virus [HIV] disease: Secondary | ICD-10-CM | POA: Insufficient documentation

## 2023-06-27 DIAGNOSIS — C3431 Malignant neoplasm of lower lobe, right bronchus or lung: Secondary | ICD-10-CM | POA: Insufficient documentation

## 2023-06-27 DIAGNOSIS — F17210 Nicotine dependence, cigarettes, uncomplicated: Secondary | ICD-10-CM | POA: Insufficient documentation

## 2023-06-27 LAB — CMP (CANCER CENTER ONLY)
ALT: 7 U/L (ref 0–44)
AST: 11 U/L — ABNORMAL LOW (ref 15–41)
Albumin: 4.1 g/dL (ref 3.5–5.0)
Alkaline Phosphatase: 69 U/L (ref 38–126)
Anion gap: 4 — ABNORMAL LOW (ref 5–15)
BUN: 23 mg/dL (ref 8–23)
CO2: 25 mmol/L (ref 22–32)
Calcium: 8.6 mg/dL — ABNORMAL LOW (ref 8.9–10.3)
Chloride: 111 mmol/L (ref 98–111)
Creatinine: 2.22 mg/dL — ABNORMAL HIGH (ref 0.61–1.24)
GFR, Estimated: 32 mL/min — ABNORMAL LOW (ref >60)
Glucose, Bld: 79 mg/dL (ref 70–99)
Potassium: 4.4 mmol/L (ref 3.5–5.1)
Sodium: 140 mmol/L (ref 135–145)
Total Bilirubin: 0.6 mg/dL (ref 0.0–1.2)
Total Protein: 7.2 g/dL (ref 6.5–8.1)

## 2023-06-27 LAB — CBC WITH DIFFERENTIAL (CANCER CENTER ONLY)
Abs Immature Granulocytes: 0.03 10*3/uL (ref 0.00–0.07)
Basophils Absolute: 0 10*3/uL (ref 0.0–0.1)
Basophils Relative: 0 %
Eosinophils Absolute: 0.2 10*3/uL (ref 0.0–0.5)
Eosinophils Relative: 2 %
HCT: 33.2 % — ABNORMAL LOW (ref 39.0–52.0)
Hemoglobin: 11.3 g/dL — ABNORMAL LOW (ref 13.0–17.0)
Immature Granulocytes: 0 %
Lymphocytes Relative: 26 %
Lymphs Abs: 2.5 10*3/uL (ref 0.7–4.0)
MCH: 31.7 pg (ref 26.0–34.0)
MCHC: 34 g/dL (ref 30.0–36.0)
MCV: 93 fL (ref 80.0–100.0)
Monocytes Absolute: 0.7 10*3/uL (ref 0.1–1.0)
Monocytes Relative: 7 %
Neutro Abs: 6.1 10*3/uL (ref 1.7–7.7)
Neutrophils Relative %: 65 %
Platelet Count: 309 10*3/uL (ref 150–400)
RBC: 3.57 MIL/uL — ABNORMAL LOW (ref 4.22–5.81)
RDW: 14.9 % (ref 11.5–15.5)
WBC Count: 9.5 10*3/uL (ref 4.0–10.5)
nRBC: 0 % (ref 0.0–0.2)

## 2023-06-27 NOTE — Progress Notes (Signed)
 [facility] Telephone:([phone]) [phone]   [phone]) [phone]  CONSULT NOTE  REFERRING PHYSICIAN: Dr. Racheal Buddle  REASON FOR CONSULTATION:  68 years old African-American male recently diagnosed with lung cancer.  HPI Kevin [name] is a 68 y.o. 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  The patient also has known pulmonary nodules that has been going on for a while and monitored closely.  He had CT scan of the chest without contrast on September 03, 2022 that showed right upper lobe solid 0.7 cm as well as right lower lobe subsolid 1.5 cm nodules and adenocarcinoma could not be excluded.  There was also several other right lung solid and subsolid nodules that have been stable.  A PET scan on 09/21/2022 showed enlarging solid and subsolid pulmonary nodules without PET correlate because of the size.  He was followed by observation and repeat CT scan of the chest without contrast on March 07, 2023 showed multiple solid and semisolid lung nodules unchanged in size compared to the previous imaging studies.  There was also some hypermetabolic small mediastinal and hilar lymph nodes that are unchanged in size.  Repeat CT scan of the chest on 05/25/2023 showed stable appearance of the 1.4 cm part solid nodule within the right lower lobe with internal solid component measuring 0.5 cm and low-grade pulmonary neoplasm such as adenocarcinoma is not excluded.  Additional lung nodules are unchanged in the interval.  On 06/11/2023 the patient underwent video  bronchoscopy under the care of Dr. [name].  The pathology [id number])  from the fine-needle aspiration of the right lower lobe nodule as well as the brushing showed non-small cell lung cancer.  The patient was referred to me today for evaluation and recommendation regarding treatment of his condition. Discussed the use of AI scribe software for clinical note transcription with the patient, who gave verbal consent to proceed.  History of Present Illness   Kevin [name] is a 68 year old male with non-small cell lung cancer who presents for evaluation and management of his condition. He is accompanied by his daughter, Kevin [name].  He has a history of chronic obstructive pulmonary disease and a significant smoking history, which led to regular CT screenings. During these screenings, nodules were discovered in his lungs. Initially, the nodules were small and stable, but a follow-up scan showed growth, prompting a biopsy. He was diagnosed with non-small cell lung cancer in the right lower lobe.  He has a history of renal cell carcinoma, which was treated with cryoablation in 2022. There has been no follow-up for this condition in nearly two years, and he is concerned about the lack of monitoring post-ablation.  He experiences fatigue and requires motivation to engage in activities. No chest pain, but his breathing is affected by weather changes, particularly humidity, which exacerbates his symptoms.  His past medical history includes high blood pressure, congestive heart failure, HIV, obstructive sleep apnea, and chronic back pain. No history of diabetes, heart attacks, or strokes.  In terms of family history, his mother had lupus and high blood pressure, his brother died from an unspecified stage four  cancer, and his sister had breast cancer.  Socially, he is separated and has five daughters. 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 He smoked for about forty years and quit drug use  twenty years ago. He occasionally drinks alcohol.       HPI  Past Medical History:  Diagnosis Date   AIDS (acquired immune deficiency syndrome) (HCC) 08/17/2016   Amaurosis fugax 08/18/2022   Chronic diastolic CHF (congestive heart failure), NYHA class 3 (HCC) 01/2016   Chronic lower back pain    CKD (chronic kidney disease), stage III (HCC)    stage 3b   COPD (chronic obstructive pulmonary disease) (HCC)    Dyspnea    Gout    "forearms, hands, ankles, feet" (06/05/2016)   Headache    "weekly" (06/05/2016)   Heart murmur    never has caused any problems per pt   History of herpes zoster virus 01/2023   Hypertension    Hypertensive crisis 08/15/2017   Lipoma 07/06/2022   Neuromuscular disorder (HCC) 02/05/2023   post herpetic neuralgia    -  Primary   OSA on CPAP    does not use CPAP   PAD (peripheral artery disease) (HCC)    PAF (paroxysmal atrial fibrillation) (HCC) 01/2016   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   Pulmonary nodules 02/2023   Substance abuse (HCC)    in past    Past Surgical History:  Procedure Laterality Date   BRONCHIAL BIOPSY  06/11/2023   Procedure: BRONCHOSCOPY, WITH BIOPSY;  Surgeon: [name], MD;  Location: [facility] ENDOSCOPY;  Service: Pulmonary;;   BRONCHIAL BRUSHINGS  06/11/2023   Procedure: BRONCHOSCOPY, WITH BRUSH BIOPSY;  Surgeon: [name], MD;  Location: [facility] ENDOSCOPY;  Service: Pulmonary;;   BRONCHIAL NEEDLE ASPIRATION BIOPSY  06/11/2023   Procedure: BRONCHOSCOPY, WITH NEEDLE ASPIRATION BIOPSY;  Surgeon: [name], MD;  Location: [facility] ENDOSCOPY;  Service: Pulmonary;;   BRONCHIAL WASHINGS  06/11/2023   Procedure: IRRIGATION, BRONCHUS;  Surgeon: [name], MD;  Location: [facility] ENDOSCOPY;  Service: Pulmonary;;   COLONOSCOPY N/A 04/24/2023   Procedure: COLONOSCOPY;  Surgeon: Ozell Blunt, MD;  Location: [facility] ENDOSCOPY;  Service: Gastroenterology;  Laterality: N/A;   IR RADIOLOGIST EVAL & MGMT  05/31/2021   IR RADIOLOGIST  EVAL & MGMT  07/26/2021   LEFT HEART CATH AND CORONARY ANGIOGRAPHY N/A 10/23/2016   Procedure: LEFT HEART CATH AND CORONARY ANGIOGRAPHY;  Surgeon: Arleen Lacer, MD;  Location: [facility] INVASIVE CV LAB;  Service: Cardiovascular;  Laterality: N/A;   LOWER EXTREMITY ANGIOGRAPHY N/A 07/17/2016   Procedure: Lower Extremity Angiography;  Surgeon: Avanell Leigh, MD;  Location: [facility] INVASIVE CV LAB;  Service: Cardiovascular;  Laterality: N/A;   LOWER EXTREMITY INTERVENTION N/A 06/05/2016   Procedure: Lower Extremity Intervention;  Surgeon: Avanell Leigh, MD;  Location: [facility] INVASIVE CV LAB;  Service: Cardiovascular;  Laterality: N/A;   PERIPHERAL VASCULAR ATHERECTOMY Right 07/17/2016   Procedure: Peripheral Vascular Atherectomy;  Surgeon: Avanell Leigh, MD;  Location: [facility] INVASIVE CV LAB;  Service: Cardiovascular;  Laterality: Right;  SFA   PERIPHERAL VASCULAR INTERVENTION  06/05/2016   Procedure: Peripheral Vascular Intervention;  Surgeon: Avanell Leigh, MD;  Location: [facility] INVASIVE CV LAB;  Service: Cardiovascular;;  left SFA   POLYPECTOMY  04/24/2023   Procedure: POLYPECTOMY;  Surgeon: Ozell Blunt, MD;  Location: [facility] ENDOSCOPY;  Service: Gastroenterology;;   RADIOLOGY WITH ANESTHESIA Right 06/29/2021   Procedure: RIGHT RENAL CRYOABLATION;  Surgeon: Julietta Ogren,  Raylene Calamity, MD;  Location: [facility] ORS;  Service: Anesthesiology;  Laterality: Right;   VIDEO BRONCHOSCOPY WITH ENDOBRONCHIAL NAVIGATION Right 06/11/2023   Procedure: VIDEO BRONCHOSCOPY WITH ENDOBRONCHIAL NAVIGATION;  Surgeon: [name], MD;  Location: [facility] ENDOSCOPY;  Service: Pulmonary;  Laterality: Right;  WITH FLUORO AND BIOPSY    Family History  Problem Relation Age of Onset   High blood pressure Mother    Lupus Mother    Seizures Daughter    Heart attack Daughter     Social History Social History   Tobacco Use   Smoking status: Every Day    Current packs/day: 0.10    Average packs/day: 0.1 packs/day for 42.0 years (4.2 ttl pk-yrs)    Types:  Cigarettes    Passive exposure: Never   Smokeless tobacco: Never   Tobacco comments:    Has not smoked since May 4th.2025-06/20/2023  Vaping Use   Vaping status: Former   Substances: Nicotine , Flavoring  Substance Use Topics   Alcohol use: Not Currently    Alcohol/week: 2.0 standard drinks of alcohol    Types: 2 Cans of beer per week    Comment: rare   Drug use: Not Currently    Comment: rare    No Active Allergies  Current Outpatient Medications  Medication Sig Dispense Refill   albuterol  (VENTOLIN  HFA) 108 (90 Base) MCG/ACT inhaler Inhale 1-2 puffs into the lungs every 6 (six) hours as needed for wheezing or shortness of breath. 6.7 g 3   albuterol  (VENTOLIN  HFA) 108 (90 Base) MCG/ACT inhaler Inhale 2-4 puffs into the lungs every 3 (three) hours as needed for wheezing or shortness of breath.     amLODipine -valsartan  (EXFORGE ) 10-320 MG tablet Take 1 tablet by mouth daily. 90 tablet 2   aspirin  EC (ASPIRIN  LOW DOSE) 81 MG tablet Take 1 tablet (81 mg total) by mouth daily, swallow whole 30 tablet 12   Aspirin -Salicylamide-Caffeine (BC HEADACHE POWDER PO) Take 1 packet by mouth every 8 (eight) hours as needed (for headaches).     atorvastatin  (LIPITOR) 80 MG tablet Take 1 tablet (80 mg total) by mouth daily. 90 tablet 1   azithromycin  (ZITHROMAX ) 250 MG tablet Take 1 tablet (250 mg total) by mouth daily. Take first 2 tablets together, then 1 every day until finished. 6 tablet 0   busPIRone  (BUSPAR ) 7.5 MG tablet Take 1 tablet (7.5 mg total) by mouth in the morning and at bedtime. 180 tablet 0   dolutegravir -lamiVUDine  (DOVATO ) 50-300 MG tablet Take 1 tablet by mouth daily. 30 tablet 11   Fluticasone -Umeclidin-Vilant (TRELEGY ELLIPTA ) 200-62.5-25 MCG/ACT AEPB Inhale 1 puff into the lungs daily. 60 each 5   Fluticasone -Umeclidin-Vilant (TRELEGY ELLIPTA ) 200-62.5-25 MCG/ACT AEPB Inhale 1 puff into the lungs daily at 2 PM.     ipratropium-albuterol  (DUONEB) 0.5-2.5 (3) MG/3ML SOLN Take 3 mLs  by nebulization every 6 (six) hours as needed. (Patient taking differently: Take 3 mLs by nebulization every 6 (six) hours as needed (for shortness of breath or wheezing).) 360 mL 12   ipratropium-albuterol  (DUONEB) 0.5-2.5 (3) MG/3ML SOLN Take 3 mLs by nebulization every 4 (four) hours as needed. 360 mL 0   montelukast  (SINGULAIR ) 10 MG tablet Take 1 tablet (10 mg total) by mouth at bedtime. 30 tablet 5   nebivolol  (BYSTOLIC ) 5 MG tablet Take 1 tablet (5 mg total) by mouth daily. 30 tablet 5   sucralfate  (CARAFATE ) 1 g tablet Take 1 tablet (1 g total) by mouth 4 (four) times daily -  with meals  and at bedtime for 14 days. 56 tablet 0   Vitamin D , Ergocalciferol , (DRISDOL ) 1.25 MG (50000 UNIT) CAPS capsule Take one capsule (50,000 Units total) by mouth once a week for 12 weeks, then recheck level in the office 12 capsule 0   No current facility-administered medications for this visit.    Review of Systems  Constitutional: positive for fatigue Eyes: negative Ears, nose, mouth, throat, and face: negative Respiratory: negative Cardiovascular: negative Gastrointestinal: negative Genitourinary:negative Integument/breast: negative Hematologic/lymphatic: negative Musculoskeletal:negative Neurological: negative Behavioral/Psych: negative Endocrine: negative Allergic/Immunologic: negative  Physical Exam  WUJ:WJXBJ, healthy, no distress, well nourished, and well developed SKIN: skin color, texture, turgor are normal, no rashes or significant lesions HEAD: Normocephalic, No masses, lesions, tenderness or abnormalities EYES: normal, PERRLA, Conjunctiva are pink and non-injected EARS: External ears normal, Canals clear OROPHARYNX:no exudate, no erythema, and lips, buccal mucosa, and tongue normal  NECK: supple, no adenopathy, no JVD LYMPH:  no palpable lymphadenopathy, no hepatosplenomegaly LUNGS: clear to auscultation , and palpation HEART: regular rate & rhythm, no murmurs, and no  gallops ABDOMEN:abdomen soft, non-tender, normal bowel sounds, and no masses or organomegaly BACK: Back symmetric, no curvature., No CVA tenderness EXTREMITIES:no joint deformities, effusion, or inflammation, no edema  NEURO: alert & oriented x 3 with fluent speech, no focal motor/sensory deficits  PERFORMANCE STATUS: ECOG 1  LABORATORY DATA: Lab Results  Component Value Date   WBC 9.5 06/27/2023   HGB 11.3 (L) 06/27/2023   HCT 33.2 (L) 06/27/2023   MCV 93.0 06/27/2023   PLT 309 06/27/2023      Chemistry      Component Value Date/Time   NA 140 06/05/2023 1559   NA 138 07/10/2018 1103   K 4.3 06/05/2023 1559   CL 110 06/05/2023 1559   CO2 23 06/05/2023 1559   BUN 21 06/05/2023 1559   BUN 17 07/10/2018 1103   CREATININE 2.27 (H) 06/05/2023 1559   CREATININE 2.03 (H) 07/06/2022 1145      Component Value Date/Time   CALCIUM  8.6 (L) 06/05/2023 1559   ALKPHOS 67 06/05/2023 1559   AST 17 06/05/2023 1559   ALT 10 06/05/2023 1559   BILITOT 0.6 06/05/2023 1559   BILITOT 0.3 10/24/2022 1432       RADIOGRAPHIC STUDIES: DG Chest Port 1 View Result Date: 06/11/2023 CLINICAL DATA:  [id number] S/P bronchoscopy with biopsy [id number] EXAM: PORTABLE CHEST - 1 VIEW COMPARISON:  June 05, 2023, March 18, 2023 FINDINGS: Low lung volumes. Elevation the right hemidiaphragm. Hazy airspace opacity in the right lung base no lobar consolidation, pleural effusion, or pneumothorax. The cardiac silhouette is at the upper limits of normal, likely accentuated by AP technique and low lung volumes. No acute fracture or destructive lesion. Multilevel thoracic osteophytosis. IMPRESSION: Low lung volumes. Hazy airspace opacities, possibly atelectasis or a small amount of postprocedural alveolar hemorrhage. No pneumothorax. Electronically Signed   By: Rance Burrows M.D.   On: 06/11/2023 15:10   DG C-ARM BRONCHOSCOPY Result Date: 06/11/2023 C-ARM BRONCHOSCOPY: Fluoroscopy was utilized by the requesting  physician.  No radiographic interpretation.   CT HEAD WO CONTRAST ( ) Result Date: 06/05/2023 EXAM: CT HEAD WITHOUT 06/05/2023 05:24:56 PM TECHNIQUE: CT of the head was performed without the administration of intravenous contrast. Automated exposure control, iterative reconstruction, and/or weight based adjustment of the mA/kV was utilized to reduce the radiation dose to as low as reasonably achievable. COMPARISON: CT Head dated 10/25/2022. CLINICAL HISTORY: Headache, new onset (Age 38y). C/O - Shortness of Breath; Headache; Hypertension. FINDINGS:  BRAIN AND VENTRICLES: There is no acute intracranial hemorrhage, mass effect or midline shift. No abnormal extra-axial fluid collection. The gray-white differentiation is maintained without evidence of an acute infarct. There is mild scattered periventricular and subcortical low density, likely reflecting small vessel ischemic changes. There is no evidence of hydrocephalus. ORBITS: The visualized portion of the orbits demonstrate no acute abnormality. SINUSES: The visualized paranasal sinuses and mastoid air cells demonstrate no acute abnormality. SOFT TISSUES AND SKULL: No acute abnormality of the visualized skull or soft tissues. IMPRESSION: 1. No acute intracranial abnormality. Electronically signed by: Zadie Herter MD 06/05/2023 05:39 PM EDT RP Workstation: [id number]   DG Chest 2 View Result Date: 06/05/2023 CLINICAL DATA:  Chest pain, shortness of breath. EXAM: CHEST - 2 VIEW COMPARISON:  March 18, 2023. FINDINGS: The heart size and mediastinal contours are within normal limits. Both lungs are clear. The visualized skeletal structures are unremarkable. IMPRESSION: No active cardiopulmonary disease. Electronically Signed   By: Rosalene Colon M.D.   On: 06/05/2023 15:45    ASSESSMENT: This is a very pleasant 68 years old African-American male with a stage Ia (T1b, N0, M0) non-small cell lung cancer presented with right lower lobe lung nodule in  addition to suspicious small groundglass opacity in the right upper lobe and few other tiny pulmonary nodules diagnosed in April 2025.   PLAN: I had a lengthy discussion with the patient and his daughter today about his current disease stage, prognosis and treatment options.  I personally and independently reviewed the scan images and discussed the results and showed the images to the patient and his daughter. Assessment and Plan    Non-small cell lung cancer, stage 1 Stage 1 non-small cell lung cancer in the right lower lobe confirmed by biopsy, though subtype undetermined due to small sample size. PET scan from August 2024 showed no activity. Surgery is contraindicated due to COPD, renal, and cardiac comorbidities. Radiation therapy is deemed curative for the nodule. Monitoring of other nodules is necessary for potential growth, with chemotherapy or other treatments as future considerations. - Order PET scan to assess for new growths since August 2024. - Refer to radiation oncology for curative radiation therapy of the right lower lobe nodule. - Schedule follow-up in four months to evaluate radiation therapy efficacy and monitor other nodules.  Chronic obstructive pulmonary disease (COPD) Long-standing COPD with exacerbations triggered by humidity and weather changes.  Congestive heart failure Congestive heart failure, not addressed in this encounter.  Hypertension Hypertension, not addressed in this encounter.  HIV disease HIV managed by Dr. Ernie Heal, infectious disease specialist.  Obstructive sleep apnea Obstructive sleep apnea, not addressed in this encounter.  Chronic back pain Chronic back pain, not addressed in this encounter.     The patient was advised to call immediately if he has any concerning symptoms in the interval. The patient voices understanding of current disease status and treatment options and is in agreement with the current care plan.  All questions were  answered. The patient knows to call the clinic with any problems, questions or concerns. We can certainly see the patient much sooner if necessary.  Thank you so much for allowing me to participate in the care of Kevin [name]. I will continue to follow up the patient with you and assist in his care.  The total time spent in the appointment was 60 minutes.  Disclaimer: This note was dictated with voice recognition software. Similar sounding words can inadvertently be transcribed and may  not be corrected upon review.   Aurelio Blower Jun 27, 2023, 2:35 PM

## 2023-06-28 ENCOUNTER — Ambulatory Visit: Admitting: Podiatry

## 2023-06-28 ENCOUNTER — Other Ambulatory Visit: Payer: Self-pay

## 2023-06-28 ENCOUNTER — Telehealth: Payer: Self-pay | Admitting: Podiatry

## 2023-06-28 MED FILL — Montelukast Sodium Tab 10 MG (Base Equiv): ORAL | 30 days supply | Qty: 30 | Fill #5 | Status: AC

## 2023-06-28 NOTE — Telephone Encounter (Signed)
 Left message for patient to call back

## 2023-06-28 NOTE — Progress Notes (Signed)
 I met the pt face to face today at his consult with Dr. Marguerita Shih. Pt was accompanied by his dtr, Loetta Ringer. The plan for the pt is to complete his cancer work up with a PET scan scheduled for 5/16 as well as a consult with Radiation Oncology on 5/16 as well. Pt understands he will need a chest CT in 4 months and to follow up with Dr.Mohamed 7-10 days after his Chest CT. I provided the pt with my card and encouraged him to call with any questions.

## 2023-06-28 NOTE — Telephone Encounter (Signed)
 Called pt to reschedule appointment. Left vmail stating his appt is cancelled and to call back to  schedule.

## 2023-06-29 ENCOUNTER — Encounter: Payer: Self-pay | Admitting: Radiation Oncology

## 2023-06-29 ENCOUNTER — Other Ambulatory Visit: Payer: Self-pay

## 2023-06-29 ENCOUNTER — Ambulatory Visit
Admission: RE | Admit: 2023-06-29 | Discharge: 2023-06-29 | Disposition: A | Discharge status: Home / Self Care | Source: Ambulatory Visit | Attending: Radiation Oncology | Admitting: Radiation Oncology

## 2023-06-29 ENCOUNTER — Ambulatory Visit (HOSPITAL_COMMUNITY)
Admission: RE | Admit: 2023-06-29 | Discharge: 2023-06-29 | Disposition: A | Discharge status: Home / Self Care | Source: Ambulatory Visit | Attending: Acute Care | Admitting: Acute Care

## 2023-06-29 VITALS — BP 185/114 | HR 76 | Temp 97.7°F | Resp 28 | Ht 73.0 in | Wt 162.2 lb

## 2023-06-29 DIAGNOSIS — B0229 Other postherpetic nervous system involvement: Secondary | ICD-10-CM | POA: Insufficient documentation

## 2023-06-29 DIAGNOSIS — Z85528 Personal history of other malignant neoplasm of kidney: Secondary | ICD-10-CM | POA: Insufficient documentation

## 2023-06-29 DIAGNOSIS — Z7982 Long term (current) use of aspirin: Secondary | ICD-10-CM | POA: Insufficient documentation

## 2023-06-29 DIAGNOSIS — C3491 Malignant neoplasm of unspecified part of right bronchus or lung: Secondary | ICD-10-CM | POA: Insufficient documentation

## 2023-06-29 DIAGNOSIS — N183 Chronic kidney disease, stage 3 unspecified: Secondary | ICD-10-CM | POA: Insufficient documentation

## 2023-06-29 DIAGNOSIS — M109 Gout, unspecified: Secondary | ICD-10-CM | POA: Insufficient documentation

## 2023-06-29 DIAGNOSIS — B20 Human immunodeficiency virus [HIV] disease: Secondary | ICD-10-CM | POA: Insufficient documentation

## 2023-06-29 DIAGNOSIS — Z79624 Long term (current) use of inhibitors of nucleotide synthesis: Secondary | ICD-10-CM | POA: Insufficient documentation

## 2023-06-29 DIAGNOSIS — I480 Paroxysmal atrial fibrillation: Secondary | ICD-10-CM | POA: Insufficient documentation

## 2023-06-29 DIAGNOSIS — C3431 Malignant neoplasm of lower lobe, right bronchus or lung: Secondary | ICD-10-CM

## 2023-06-29 DIAGNOSIS — Z79899 Other long term (current) drug therapy: Secondary | ICD-10-CM | POA: Insufficient documentation

## 2023-06-29 DIAGNOSIS — R011 Cardiac murmur, unspecified: Secondary | ICD-10-CM | POA: Insufficient documentation

## 2023-06-29 DIAGNOSIS — R918 Other nonspecific abnormal finding of lung field: Secondary | ICD-10-CM | POA: Diagnosis not present

## 2023-06-29 DIAGNOSIS — F17210 Nicotine dependence, cigarettes, uncomplicated: Secondary | ICD-10-CM | POA: Diagnosis not present

## 2023-06-29 DIAGNOSIS — I130 Hypertensive heart and chronic kidney disease with heart failure and stage 1 through stage 4 chronic kidney disease, or unspecified chronic kidney disease: Secondary | ICD-10-CM | POA: Insufficient documentation

## 2023-06-29 DIAGNOSIS — G8929 Other chronic pain: Secondary | ICD-10-CM | POA: Insufficient documentation

## 2023-06-29 DIAGNOSIS — J449 Chronic obstructive pulmonary disease, unspecified: Secondary | ICD-10-CM | POA: Insufficient documentation

## 2023-06-29 DIAGNOSIS — I739 Peripheral vascular disease, unspecified: Secondary | ICD-10-CM | POA: Insufficient documentation

## 2023-06-29 LAB — GLUCOSE, CAPILLARY: Glucose-Capillary: 88 mg/dL (ref 70–99)

## 2023-06-29 MED ORDER — HYDRALAZINE HCL 25 MG PO TABS: 25.0000 mg | ORAL_TABLET | Freq: Two times a day (BID) | ORAL | 3 refills | Status: DC

## 2023-06-29 MED ORDER — FLUDEOXYGLUCOSE F - 18 (FDG) INJECTION
8.0000 | Freq: Once | INTRAVENOUS | Status: AC
  Administered 2023-06-29: 8.09 via INTRAVENOUS

## 2023-06-29 NOTE — Telephone Encounter (Signed)
 3rd call attempt, patient states it has been running high 180-200s/100-1teens. Advised patient of new medication recommendations. He states he does not think he needs more medication- because it is only that high at his visits because he hasn't taken his medications yet. He states at home he still gets numbers around 149. Advised pt of BP goal <130/80. Pts daughter with him and states okay to send in medication that they will get him started on it.

## 2023-06-29 NOTE — Progress Notes (Signed)
 Thoracic Location of Tumor / Histology: Right Lung  Patient has been in surveillance for lung nodules since July 2024.  CT Chest 05/25/2023 showed stable appearance of the 1.4 cm part solid nodule within the right lower lobe with internal solid component measuring 0.5 cm and low-grade pulmonary neoplasm such as adenocarcinoma is not excluded.    PET 06/29/2023  Bronchoscopy 06/11/2023   Biopsies of Right Lower Lobe Lung 06/11/2023    Past/Anticipated interventions by cardiothoracic surgery, if any:   Past/Anticipated interventions by medical oncology, if any:  Dr. Marguerita Shih 06/27/2023 - Order PET scan to assess for new growths since August 2024. - Refer to radiation oncology for curative radiation therapy of the right lower lobe nodule.   Tobacco/Marijuana/Snuff/ETOH use: Former Smoker   Signs/Symptoms Weight changes, if any: No Respiratory complaints, if any: He reports SOB all day, using an inhaler.  Worse with increased activities.  He reports occasional feeling winded at rest. Hemoptysis, if any: He reports productive cough with mucus, denies hemoptysis. Pain issues, if any:  He reports 10/10 pain in his feet.  SAFETY ISSUES: Prior radiation? No Pacemaker/ICD? No  Possible current pregnancy? N/a Is the patient on methotrexate? No   Current Complaints / other details:   History of Renal Cell Carcinoma treated with cryoablation in 2022.

## 2023-06-29 NOTE — Addendum Note (Signed)
 Addended by: Guss Legacy on: 06/29/2023 09:09 AM   Modules accepted: Orders

## 2023-06-29 NOTE — Progress Notes (Addendum)
 Radiation Oncology         [phone] ________________________________  Name: Kevin [name]        MRN: [id number]  Date of Service: 06/29/2023 DOB: [birth year]  WU:[id number], Lea Primmer, MD  [name], MD     REFERRING PHYSICIAN: [name], MD   DIAGNOSIS: The encounter diagnosis was Malignant neoplasm of lower lobe of right lung [facility]).  Stage 1A2 (cT1b, N0, M0) NSCLC of the right lower lobe  HISTORY OF PRESENT ILLNESS: Kevin [name] is a 68 y.o. male with a medical history significant for renal cell carcinoma; s/p cryoablation in 2022, tobacco abuse, COPD, congestive heart failure, hypertension, and CKD, seen at the request of Dr. Marguerita Shih for a newly diagnosed right lung cancer.  The patient has a known history of pulmonary nodules that have been monitored closely.  CT scan of the chest on 09/03/2022 demonstrated a solid 0.7 cm right upper lobe nodule, a 1.5 cm right lower lobe subsolid nodule, along with several other stable right lung nodules.  Subsequent PET scan on 09/21/2022 showed no PET correlate with the previously seen pulmonary nodules on prior CT.  He was continue to be followed with observation.  Repeat CT scan of the chest on 03/07/2023 demonstrated the lung nodules to be unchanged in size when compared to prior CT.    Most recent CT of the chest on 05/25/2023 demonstrated the 1.4 cm right lower lobe nodule to be stable in appearance, and the additional pulmonary nodules to also be stable.  Given the suspicious appearance of the right lower lobe nodule, patient proceeded with a bronchoscopy and biopsy on 06/11/2023 under the care of Dr. [name].  Pathology from the fine-needle aspiration of the right lower lobe nodule demonstrated non-small cell lung cancer.  Patient met with Dr. Marguerita Shih on 06/27/2023 to review current workup and discuss further recommendations.  Patient decided to proceed with a PET scan to assess for new growth since his previous PET in August 2024.  He was  kindly referred to us  today to discuss radiation treatment to the right lower lobe nodule.    PREVIOUS RADIATION THERAPY: No   PAST MEDICAL HISTORY:  Past Medical History:  Diagnosis Date   AIDS (acquired immune deficiency syndrome) (HCC) 08/17/2016   Amaurosis fugax 08/18/2022   Chronic diastolic CHF (congestive heart failure), NYHA class 3 (HCC) 01/2016   Chronic lower back pain    CKD (chronic kidney disease), stage III (HCC)    stage 3b   COPD (chronic obstructive pulmonary disease) (HCC)    Dyspnea    Gout    "forearms, hands, ankles, feet" (06/05/2016)   Headache    "weekly" (06/05/2016)   Heart murmur    never has caused any problems per pt   History of herpes zoster virus 01/2023   Hypertension    Hypertensive crisis 08/15/2017   Lipoma 07/06/2022   Neuromuscular disorder (HCC) 02/05/2023   post herpetic neuralgia    -  Primary   OSA on CPAP    does not use CPAP   PAD (peripheral artery disease) (HCC)    PAF (paroxysmal atrial fibrillation) (HCC) 01/2016   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   Pulmonary nodules 02/2023   Substance abuse (HCC)    in past       PAST SURGICAL HISTORY: Past Surgical History:  Procedure Laterality Date   BRONCHIAL BIOPSY  06/11/2023   Procedure: BRONCHOSCOPY, WITH BIOPSY;  Surgeon: [name], MD;  Location:  [facility] ENDOSCOPY;  Service: Pulmonary;;   BRONCHIAL BRUSHINGS  06/11/2023   Procedure: BRONCHOSCOPY, WITH BRUSH BIOPSY;  Surgeon: [name], MD;  Location: [facility] ENDOSCOPY;  Service: Pulmonary;;   BRONCHIAL NEEDLE ASPIRATION BIOPSY  06/11/2023   Procedure: BRONCHOSCOPY, WITH NEEDLE ASPIRATION BIOPSY;  Surgeon: [name], MD;  Location: [facility] ENDOSCOPY;  Service: Pulmonary;;   BRONCHIAL WASHINGS  06/11/2023   Procedure: IRRIGATION, BRONCHUS;  Surgeon: [name], MD;  Location: [facility] ENDOSCOPY;  Service: Pulmonary;;   COLONOSCOPY N/A 04/24/2023   Procedure: COLONOSCOPY;  Surgeon: Ozell Blunt, MD;  Location: [facility]  ENDOSCOPY;  Service: Gastroenterology;  Laterality: N/A;   IR RADIOLOGIST EVAL & MGMT  05/31/2021   IR RADIOLOGIST EVAL & MGMT  07/26/2021   LEFT HEART CATH AND CORONARY ANGIOGRAPHY N/A 10/23/2016   Procedure: LEFT HEART CATH AND CORONARY ANGIOGRAPHY;  Surgeon: Arleen Lacer, MD;  Location: [facility] INVASIVE CV LAB;  Service: Cardiovascular;  Laterality: N/A;   LOWER EXTREMITY ANGIOGRAPHY N/A 07/17/2016   Procedure: Lower Extremity Angiography;  Surgeon: Avanell Leigh, MD;  Location: [facility] INVASIVE CV LAB;  Service: Cardiovascular;  Laterality: N/A;   LOWER EXTREMITY INTERVENTION N/A 06/05/2016   Procedure: Lower Extremity Intervention;  Surgeon: Avanell Leigh, MD;  Location: [facility] INVASIVE CV LAB;  Service: Cardiovascular;  Laterality: N/A;   PERIPHERAL VASCULAR ATHERECTOMY Right 07/17/2016   Procedure: Peripheral Vascular Atherectomy;  Surgeon: Avanell Leigh, MD;  Location: [facility] INVASIVE CV LAB;  Service: Cardiovascular;  Laterality: Right;  SFA   PERIPHERAL VASCULAR INTERVENTION  06/05/2016   Procedure: Peripheral Vascular Intervention;  Surgeon: Avanell Leigh, MD;  Location: [facility] INVASIVE CV LAB;  Service: Cardiovascular;;  left SFA   POLYPECTOMY  04/24/2023   Procedure: POLYPECTOMY;  Surgeon: Ozell Blunt, MD;  Location: [facility] ENDOSCOPY;  Service: Gastroenterology;;   RADIOLOGY WITH ANESTHESIA Right 06/29/2021   Procedure: RIGHT RENAL CRYOABLATION;  Surgeon: Elene Griffes, MD;  Location: [facility] ORS;  Service: Anesthesiology;  Laterality: Right;   VIDEO BRONCHOSCOPY WITH ENDOBRONCHIAL NAVIGATION Right 06/11/2023   Procedure: VIDEO BRONCHOSCOPY WITH ENDOBRONCHIAL NAVIGATION;  Surgeon: [name], MD;  Location: [facility] ENDOSCOPY;  Service: Pulmonary;  Laterality: Right;  WITH FLUORO AND BIOPSY     FAMILY HISTORY:  Family History  Problem Relation Age of Onset   High blood pressure Mother    Lupus Mother    Seizures Daughter    Heart attack Daughter      SOCIAL HISTORY:  reports that he has been smoking  cigarettes. He has a 4.2 pack-year smoking history. He has never been exposed to tobacco smoke. He has never used smokeless tobacco. He reports that he does not currently use alcohol after a past usage of about 2.0 standard drinks of alcohol per week. He reports that he does not currently use drugs.   ALLERGIES: Patient has no active allergies.   MEDICATIONS:  Current Outpatient Medications  Medication Sig Dispense Refill   albuterol  (VENTOLIN  HFA) 108 (90 Base) MCG/ACT inhaler Inhale 1-2 puffs into the lungs every 6 (six) hours as needed for wheezing or shortness of breath. 6.7 g 3   albuterol  (VENTOLIN  HFA) 108 (90 Base) MCG/ACT inhaler Inhale 2-4 puffs into the lungs every 3 (three) hours as needed for wheezing or shortness of breath.     amLODipine -valsartan  (EXFORGE ) 10-320 MG tablet Take 1 tablet by mouth daily. 90 tablet 2   aspirin  EC (ASPIRIN  LOW DOSE) 81 MG tablet Take 1 tablet (81 mg total) by mouth daily, swallow whole  30 tablet 12   Aspirin -Salicylamide-Caffeine (BC HEADACHE POWDER PO) Take 1 packet by mouth every 8 (eight) hours as needed (for headaches).     atorvastatin  (LIPITOR) 80 MG tablet Take 1 tablet (80 mg total) by mouth daily. 90 tablet 1   azithromycin  (ZITHROMAX ) 250 MG tablet Take 1 tablet (250 mg total) by mouth daily. Take first 2 tablets together, then 1 every day until finished. 6 tablet 0   busPIRone  (BUSPAR ) 7.5 MG tablet Take 1 tablet (7.5 mg total) by mouth in the morning and at bedtime. 180 tablet 0   dolutegravir -lamiVUDine  (DOVATO ) 50-300 MG tablet Take 1 tablet by mouth daily. 30 tablet 11   Fluticasone -Umeclidin-Vilant (TRELEGY ELLIPTA ) 200-62.5-25 MCG/ACT AEPB Inhale 1 puff into the lungs daily. 60 each 5   Fluticasone -Umeclidin-Vilant (TRELEGY ELLIPTA ) 200-62.5-25 MCG/ACT AEPB Inhale 1 puff into the lungs daily at 2 PM.     ipratropium-albuterol  (DUONEB) 0.5-2.5 (3) MG/3ML SOLN Take 3 mLs by nebulization every 6 (six) hours as needed. (Patient taking  differently: Take 3 mLs by nebulization every 6 (six) hours as needed (for shortness of breath or wheezing).) 360 mL 12   ipratropium-albuterol  (DUONEB) 0.5-2.5 (3) MG/3ML SOLN Take 3 mLs by nebulization every 4 (four) hours as needed. 360 mL 0   montelukast  (SINGULAIR ) 10 MG tablet Take 1 tablet (10 mg total) by mouth at bedtime. 30 tablet 5   nebivolol  (BYSTOLIC ) 5 MG tablet Take 1 tablet (5 mg total) by mouth daily. 30 tablet 5   Vitamin D , Ergocalciferol , (DRISDOL ) 1.25 MG (50000 UNIT) CAPS capsule Take one capsule (50,000 Units total) by mouth once a week for 12 weeks, then recheck level in the office 12 capsule 0   hydrALAZINE  (APRESOLINE ) 25 MG tablet Take 1 tablet (25 mg total) by mouth 2 (two) times daily. 180 tablet 3   No current facility-administered medications for this encounter.     REVIEW OF SYSTEMS: On review of systems, the patient reports that he is doing well overall.  He experiences shortness of breath, especially in the morning and with activity.  He uses an inhaler for his COPD.  Patient notes occasional shortness of breath that rest.  He notes a productive cough, but denies any hemoptysis.  He denies any chest pain, or unintended weight changes.     PHYSICAL EXAM:  Wt Readings from Last 3 Encounters:  06/29/23 162 lb 3.2 oz (73.6 kg)  06/27/23 161 lb 12.8 oz (73.4 kg)  06/20/23 162 lb 6.4 oz (73.7 kg)   Temp Readings from Last 3 Encounters:  06/29/23 97.7 F (36.5 C)  06/27/23 98.1 F (36.7 C) (Temporal)  06/11/23 98 F (36.7 C) (Temporal)   BP Readings from Last 3 Encounters:  06/29/23 (!) 185/114  06/27/23 (!) 181/84  06/20/23 (!) 146/89   Pulse Readings from Last 3 Encounters:  06/29/23 76  06/27/23 (!) 53  06/20/23 63   Pain Assessment Pain Score: 10-Worst pain ever Pain Loc: Foot/10  In general this is a well appearing male in no acute distress. He's alert and oriented x4 and appropriate throughout the examination. Cardiopulmonary assessment is  negative for acute distress and he exhibits normal effort.     ECOG = 1  0 - Asymptomatic (Fully active, able to carry on all predisease activities without restriction)  1 - Symptomatic but completely ambulatory (Restricted in physically strenuous activity but ambulatory and able to carry out work of a light or sedentary nature. For example, light housework, office work)  2 -  Symptomatic, <50% in bed during the day (Ambulatory and capable of all self care but unable to carry out any work activities. Up and about more than 50% of waking hours)  3 - Symptomatic, >50% in bed, but not bedbound (Capable of only limited self-care, confined to bed or chair 50% or more of waking hours)  4 - Bedbound (Completely disabled. Cannot carry on any self-care. Totally confined to bed or chair)  5 - Death   Aurea Blossom MM, Creech RH, Tormey DC, et al. [phone]). "Toxicity and response criteria of the [facility]". Am. Hillard Lowes. Oncol. 5 (6): 649-55    LABORATORY DATA:  Lab Results  Component Value Date   WBC 9.5 06/27/2023   HGB 11.3 (L) 06/27/2023   HCT 33.2 (L) 06/27/2023   MCV 93.0 06/27/2023   PLT 309 06/27/2023   Lab Results  Component Value Date   NA 140 06/27/2023   K 4.4 06/27/2023   CL 111 06/27/2023   CO2 25 06/27/2023   Lab Results  Component Value Date   ALT 7 06/27/2023   AST 11 (L) 06/27/2023   ALKPHOS 69 06/27/2023   BILITOT 0.6 06/27/2023      RADIOGRAPHY: DG Chest Port 1 View Result Date: 06/11/2023 CLINICAL DATA:  [id number] S/P bronchoscopy with biopsy [id number] EXAM: PORTABLE CHEST - 1 VIEW COMPARISON:  June 05, 2023, March 18, 2023 FINDINGS: Low lung volumes. Elevation the right hemidiaphragm. Hazy airspace opacity in the right lung base no lobar consolidation, pleural effusion, or pneumothorax. The cardiac silhouette is at the upper limits of normal, likely accentuated by AP technique and low lung volumes. No acute fracture or destructive lesion.  Multilevel thoracic osteophytosis. IMPRESSION: Low lung volumes. Hazy airspace opacities, possibly atelectasis or a small amount of postprocedural alveolar hemorrhage. No pneumothorax. Electronically Signed   By: Rance Burrows M.D.   On: 06/11/2023 15:10   DG C-ARM BRONCHOSCOPY Result Date: 06/11/2023 C-ARM BRONCHOSCOPY: Fluoroscopy was utilized by the requesting physician.  No radiographic interpretation.   CT HEAD WO CONTRAST ( ) Result Date: 06/05/2023 EXAM: CT HEAD WITHOUT 06/05/2023 05:24:56 PM TECHNIQUE: CT of the head was performed without the administration of intravenous contrast. Automated exposure control, iterative reconstruction, and/or weight based adjustment of the mA/kV was utilized to reduce the radiation dose to as low as reasonably achievable. COMPARISON: CT Head dated 10/25/2022. CLINICAL HISTORY: Headache, new onset (Age 53y). C/O - Shortness of Breath; Headache; Hypertension. FINDINGS: BRAIN AND VENTRICLES: There is no acute intracranial hemorrhage, mass effect or midline shift. No abnormal extra-axial fluid collection. The gray-white differentiation is maintained without evidence of an acute infarct. There is mild scattered periventricular and subcortical low density, likely reflecting small vessel ischemic changes. There is no evidence of hydrocephalus. ORBITS: The visualized portion of the orbits demonstrate no acute abnormality. SINUSES: The visualized paranasal sinuses and mastoid air cells demonstrate no acute abnormality. SOFT TISSUES AND SKULL: No acute abnormality of the visualized skull or soft tissues. IMPRESSION: 1. No acute intracranial abnormality. Electronically signed by: Zadie Herter MD 06/05/2023 05:39 PM EDT RP Workstation: [id number]   DG Chest 2 View Result Date: 06/05/2023 CLINICAL DATA:  Chest pain, shortness of breath. EXAM: CHEST - 2 VIEW COMPARISON:  March 18, 2023. FINDINGS: The heart size and mediastinal contours are within normal limits. Both  lungs are clear. The visualized skeletal structures are unremarkable. IMPRESSION: No active cardiopulmonary disease. Electronically Signed   By: Rosalene Colon M.D.   On: 06/05/2023 15:45  IMPRESSION/PLAN: 1. Stage 1A2 (cT1b, N0, M0) NSCLC of the right lower lobe  We have reviewed this patient's current workup.  Imaging demonstrates a 1.4 cm nodule in the right lower lobe, biopsy proven non-small cell lung cancer.  PET scan is scheduled for later today to complete staging workup.  Given the patient's medical comorbidities, he is not a surgical candidate.  Dr. Jeryl Moris recommends a 3-5 fraction course of stereotactic body radiotherapy (SBRT) to the right lower lobe nodule.  Today, I talked to the patient and family about the findings and work-up thus far.  We discussed the natural history of stage 1 NSCLC and general treatment, highlighting the role of radiotherapy in the management.  We discussed the available radiation techniques, and focused on the details of logistics and delivery.  We reviewed the anticipated acute and late sequelae associated with radiation in this setting.  The patient was encouraged to ask questions that I answered to the best of my ability. A patient consent form was discussed and signed.  We retained a copy for our records.  The patient would like to proceed with radiation and will be scheduled for CT simulation. (We will review final results of PET imaging prior to proceeding with radiation treatment.)  We look forward to participating in this patient's care.  2. Tobacco Abuse  Patient is currently smoking "a couple" cigarettes/day and vaping. Counseled patient on the dangers of tobacco use and advised the patient to stop smoking. Patient states he is motivated to quit, I encouraged him  to pick a quit date.     In a visit lasting 60 minutes, greater than 50% of the time was spent face to face discussing the patient's condition, in preparation for the discussion, and  coordinating the patient's care.   The above documentation reflects my direct findings during this shared patient visit. Please see the separate note by Dr. Jeryl Moris on this date for the remainder of the patient's plan of care.    Julio Ohm, PA-C    **Disclaimer: This note was dictated with voice recognition software. Similar sounding words can inadvertently be transcribed and this note may contain transcription errors which may not have been corrected upon publication of note.**

## 2023-07-03 ENCOUNTER — Ambulatory Visit (INDEPENDENT_AMBULATORY_CARE_PROVIDER_SITE_OTHER)

## 2023-07-03 ENCOUNTER — Ambulatory Visit (INDEPENDENT_AMBULATORY_CARE_PROVIDER_SITE_OTHER): Admitting: Podiatry

## 2023-07-03 ENCOUNTER — Ambulatory Visit

## 2023-07-03 ENCOUNTER — Encounter: Payer: Self-pay | Admitting: Podiatry

## 2023-07-03 ENCOUNTER — Ambulatory Visit: Admitting: Radiation Oncology

## 2023-07-03 VITALS — Ht 73.0 in | Wt 162.2 lb

## 2023-07-03 DIAGNOSIS — M778 Other enthesopathies, not elsewhere classified: Secondary | ICD-10-CM | POA: Diagnosis not present

## 2023-07-03 DIAGNOSIS — B351 Tinea unguium: Secondary | ICD-10-CM | POA: Diagnosis not present

## 2023-07-03 DIAGNOSIS — M79672 Pain in left foot: Secondary | ICD-10-CM | POA: Diagnosis not present

## 2023-07-03 DIAGNOSIS — M779 Enthesopathy, unspecified: Secondary | ICD-10-CM

## 2023-07-03 DIAGNOSIS — M79674 Pain in right toe(s): Secondary | ICD-10-CM | POA: Diagnosis not present

## 2023-07-03 DIAGNOSIS — M79671 Pain in right foot: Secondary | ICD-10-CM

## 2023-07-03 DIAGNOSIS — G8929 Other chronic pain: Secondary | ICD-10-CM

## 2023-07-03 DIAGNOSIS — L84 Corns and callosities: Secondary | ICD-10-CM

## 2023-07-03 DIAGNOSIS — M79675 Pain in left toe(s): Secondary | ICD-10-CM

## 2023-07-03 DIAGNOSIS — I739 Peripheral vascular disease, unspecified: Secondary | ICD-10-CM

## 2023-07-03 MED ORDER — PREGABALIN 50 MG PO CAPS
50.0000 mg | ORAL_CAPSULE | Freq: Every evening | ORAL | 0 refills | Status: DC | PRN
  Filled 2023-07-03: qty 30, 30d supply, fill #0

## 2023-07-03 MED ORDER — CICLOPIROX 8 % EX SOLN
Freq: Every day | CUTANEOUS | 2 refills | Status: DC
  Filled 2023-07-03 – 2023-07-13 (×2): qty 6.6, 30d supply, fill #0
  Filled 2023-08-08: qty 6.6, 30d supply, fill #1
  Filled 2023-09-07: qty 6.6, 30d supply, fill #2

## 2023-07-03 NOTE — Patient Instructions (Signed)
 Pregabalin Capsules What is this medication? PREGABALIN (pre GAB a lin) treats nerve pain. It may also be used to prevent and control seizures in people with epilepsy. It works by calming overactive nerves in your body. This medicine may be used for other purposes; ask your health care provider or pharmacist if you have questions. COMMON BRAND NAME(S): Lyrica What should I tell my care team before I take this medication? They need to know if you have any of these conditions: Heart failure Kidney disease Lung disease Substance use disorder Suicidal thoughts, plans or attempt by you or a family member An unusual or allergic reaction to pregabalin, other medications, foods, dyes, or preservatives Pregnant or trying to get pregnant Breast-feeding How should I use this medication? Take this medication by mouth with water. Take it as directed on the prescription label at the same time every day. You can take it with or without food. If it upsets your stomach, take it with food. Keep taking it unless your care team tells you to stop. A special MedGuide will be given to you by the pharmacist with each prescription and refill. Be sure to read this information carefully each time. Talk to your care team about the use of this medication in children. While it may be prescribed for children as young as 1 month for selected conditions, precautions do apply. Overdosage: If you think you have taken too much of this medicine contact a poison control center or emergency room at once. NOTE: This medicine is only for you. Do not share this medicine with others. What if I miss a dose? If you miss a dose, take it as soon as you can. If it is almost time for your next dose, take only that dose. Do not take double or extra doses. What may interact with this medication? This medication may interact with the following: Alcohol Antihistamines for allergy, cough, and cold Certain medications for anxiety or  sleep Certain medications for blood pressure, heart disease Certain medications for depression like amitriptyline, fluoxetine, sertraline Certain medications for diabetes, like pioglitazone, rosiglitazone Certain medications for seizures like phenobarbital, primidone General anesthetics like halothane, isoflurane, methoxyflurane, propofol Medications that relax muscles for surgery Opioid medications for pain Phenothiazines like chlorpromazine, mesoridazine, prochlorperazine, thioridazine This list may not describe all possible interactions. Give your health care provider a list of all the medicines, herbs, non-prescription drugs, or dietary supplements you use. Also tell them if you smoke, drink alcohol, or use illegal drugs. Some items may interact with your medicine. What should I watch for while using this medication? Visit your care team for regular checks on your progress. Tell your care team if your symptoms do not start to get better or if they get worse. Do not suddenly stop taking this medication. You may develop a severe reaction. Your care team will tell you how much medication to take. If your care team wants you to stop the medication, the dose may be slowly lowered over time to avoid any side effects. This medication may affect your coordination, reaction time, or judgment. Do not drive or operate machinery until you know how this medication affects you. Sit up or stand slowly to reduce the risk of dizzy or fainting spells. Drinking alcohol with this medication can increase the risk of these side effects. If you or your family notice any changes in your behavior, such as new or worsening depression, thoughts of harming yourself, anxiety, other unusual or disturbing thoughts, or memory loss, call your  care team right away. Wear a medical ID bracelet or chain if you are taking this medication for seizures. Carry a card that describes your condition. List the medications and doses you take  on the card. This medication may make it more difficult to father a child. Talk to your care team if you are concerned about your fertility. What side effects may I notice from receiving this medication? Side effects that you should report to your care team as soon as possible: Allergic reactions or angioedema--skin rash, itching, hives, swelling of the face, eyes, lips, tongue, arms, or legs, trouble swallowing or breathing Blurry vision Thoughts of suicide or self-harm, worsening mood, feelings of depression Trouble breathing Side effects that usually do not require medical attention (report to your care team if they continue or are bothersome): Dizziness Drowsiness Dry mouth Nausea Swelling of the ankles, feet, hands Vomiting Weight gain This list may not describe all possible side effects. Call your doctor for medical advice about side effects. You may report side effects to FDA at 1-800-FDA-1088. Where should I keep my medication? Keep out of the reach of children and pets. This medication can be abused. Keep it in a safe place to protect it from theft. Do not share it with anyone. It is only for you. Selling or giving away this medication is dangerous and against the law. Store at [employer] C (77 degrees F). Get rid of any unused medication after the expiration date. This medication may cause harm and death if it is taken by other adults, children, or pets. It is important to get rid of the medication as soon as you no longer need it, or it is expired. You can do this in two ways: Take the medication to a medication take-back program. Check with your pharmacy or law enforcement to find a location. If you cannot return the medication, check the label or package insert to see if the medication should be thrown out in the garbage or flushed down the toilet. If you are not sure, ask your care team. If it is safe to put it in the trash, take the medication out of the container. Mix the  medication with cat litter, dirt, coffee grounds, or other unwanted substance. Seal the mixture in a bag or container. Put it in the trash. NOTE: This sheet is a summary. It may not cover all possible information. If you have questions about this medicine, talk to your doctor, pharmacist, or health care provider.  2024 Elsevier/Gold Standard (2020-11-02 00:00:00)

## 2023-07-04 ENCOUNTER — Other Ambulatory Visit: Payer: Self-pay

## 2023-07-04 NOTE — Progress Notes (Signed)
 Subjective:  Patient ID: Kevin [name], male    DOB: 1955-08-24,  MRN: [id number]  Chief Complaint  Patient presents with   Foot Pain    Pt is here for bilateral foot pain states his feet are always in constant pain, states he his heel spurs in the bottom of his feet, would like some relief for the pain.    Discussed the use of AI scribe software for clinical note transcription with the patient, who gave verbal consent to proceed.  History of Present Illness Kevin [name] is a 68 year old male with circulation issues who presents with severe foot pain and thickened toenails.  He experiences severe pain in both feet, particularly on the sides and under the toenails, exacerbated by thick calluses and toenails. Soreness is present in the third and fourth toenails, which are thickened and difficult to cut, possibly due to a fungal infection. Stepping on small objects causes significant discomfort.  Previous circulation tests and procedures on both legs were conducted to address circulation issues. No pain or cramping is experienced in the legs, but significant discomfort in the feet affects his mobility.  He has tried gabapentin  for back pain, which was ineffective, and has not used it for foot pain. He has not been on Lyrica  or pregabalin  for his feet. His big toe aches at night, initially suspecting gout, but denies swelling or redness. No numbness, tingling, or burning is experienced in the feet, but there is a general lack of confidence in his foot.      Objective:    Physical Exam General: AAO x3, NAD  Dermatological: The toenails are all quite hypertrophic, dystrophic with yellow discoloration with tenderness palpation mostly to the toes on the left side.  There is no edema, erythema or signs of infection.  There are no open lesions noted bilaterally but there is thick preulcerative calluses submetatarsal 5 on the left foot worse on the right.  There is no underlying  ulceration.  Vascular: Dorsalis Pedis artery and Posterior Tibial artery pedal pulses are decreased bilateral with immedate capillary fill time.There is no pain with calf compression, swelling, warmth, erythema.   Neruologic: Grossly intact via light touch bilateral.   Musculoskeletal: Tenderness on the hyperkeratotic lesions as well as toenails.    No images are attached to the encounter.    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  Digital contractures present.   Assessment:   1. Skin lesion   2. Capsulitis of foot   3. Chronic pain in right foot   4. Chronic foot pain, left   5. PAD (peripheral artery disease) (HCC)   6. Dermatophytosis of nail   7. Pain in toes of both feet      Plan:  Patient was evaluated and treated and all questions answered.  Assessment and Plan Assessment & Plan Foot pain with calluses and thick toenails Chronic foot pain due to pressure and ambulation, with calluses from abnormal pressure points and thick toenails possibly from fungal infection. Circulatory issues may limit surgical options. Long-term management requires addressing pressure points. - Sharply debrided hyperkeratotic lesions x 2 with any complications or bleeding. - Provide pads and cushions to alleviate pressure points.  Dispo metatarsal pads- on the way out he states that his foot felt much better with the pads. [employer] authorization for custom shoe inserts. - Prescribe cream to soften calluses and thin toenails.  Urea cream. - Sharply debrided nails x 2 without  any complications or bleeding. - Prescribe Lyrica  low-dose.  Discussed side effects.  Peripheral vascular disease Peripheral vascular disease with previous leg procedures. Suboptimal circulation complicates surgical interventions. Non-surgical interventions preferred. - Monitor circulation status and avoid surgical interventions due to poor  circulation.    Return in about 9 weeks (around 09/04/2023) for callus/nail trim .   Charity Conch DPM

## 2023-07-04 NOTE — Progress Notes (Signed)
 The 10-year ASCVD risk score (Arnett DK, et al., 2019) is: 63.3%   Values used to calculate the score:     Age: 68 years     Sex: Male     Is Non-Hispanic African American: Yes     Diabetic: Yes     Tobacco smoker: Yes     Systolic Blood Pressure: 185 mmHg     Is BP treated: Yes     HDL Cholesterol: 79 mg/dL     Total Cholesterol: 190 mg/dL  Currently prescribed atorvastatin  80 mg.  Kahlin Mark, BSN, RN

## 2023-07-05 ENCOUNTER — Ambulatory Visit
Admission: RE | Admit: 2023-07-05 | Discharge: 2023-07-05 | Disposition: A | Discharge status: Home / Self Care | Source: Ambulatory Visit | Attending: Radiation Oncology | Admitting: Radiation Oncology

## 2023-07-05 ENCOUNTER — Encounter: Payer: Self-pay | Admitting: Radiation Oncology

## 2023-07-05 DIAGNOSIS — F17210 Nicotine dependence, cigarettes, uncomplicated: Secondary | ICD-10-CM | POA: Diagnosis not present

## 2023-07-05 DIAGNOSIS — Z510 Encounter for antineoplastic radiation therapy: Secondary | ICD-10-CM | POA: Insufficient documentation

## 2023-07-05 DIAGNOSIS — C3431 Malignant neoplasm of lower lobe, right bronchus or lung: Secondary | ICD-10-CM | POA: Diagnosis not present

## 2023-07-05 NOTE — Progress Notes (Signed)
 I spoke with the patient in our clinic following his simulation to communicate his results from PET scan that Dr. Jeryl Moris reviewed. We will keep plans as previously outlined for SBRT to the lung. He will follow up with Dr. Marguerita Shih for surveillance.

## 2023-07-06 ENCOUNTER — Other Ambulatory Visit: Payer: Self-pay

## 2023-07-06 ENCOUNTER — Other Ambulatory Visit (HOSPITAL_COMMUNITY): Payer: Self-pay

## 2023-07-12 ENCOUNTER — Other Ambulatory Visit: Payer: Self-pay

## 2023-07-13 ENCOUNTER — Other Ambulatory Visit (HOSPITAL_COMMUNITY): Payer: Self-pay

## 2023-07-13 ENCOUNTER — Other Ambulatory Visit: Payer: Self-pay

## 2023-07-13 LAB — FUNGAL ORGANISM REFLEX

## 2023-07-13 LAB — FUNGUS CULTURE RESULT

## 2023-07-13 LAB — FUNGUS CULTURE WITH STAIN

## 2023-07-17 ENCOUNTER — Other Ambulatory Visit: Payer: Self-pay

## 2023-07-17 ENCOUNTER — Ambulatory Visit
Admission: RE | Admit: 2023-07-17 | Discharge: 2023-07-17 | Disposition: A | Discharge status: Home / Self Care | Source: Ambulatory Visit | Attending: Radiation Oncology | Admitting: Radiation Oncology

## 2023-07-17 DIAGNOSIS — F17210 Nicotine dependence, cigarettes, uncomplicated: Secondary | ICD-10-CM | POA: Diagnosis not present

## 2023-07-17 DIAGNOSIS — Z510 Encounter for antineoplastic radiation therapy: Secondary | ICD-10-CM | POA: Insufficient documentation

## 2023-07-17 DIAGNOSIS — C3431 Malignant neoplasm of lower lobe, right bronchus or lung: Secondary | ICD-10-CM | POA: Insufficient documentation

## 2023-07-17 LAB — RAD ONC ARIA SESSION SUMMARY
Course Elapsed Days: 0
Plan Fractions Treated to Date: 1
Plan Prescribed Dose Per Fraction: 18 Gy
Plan Total Fractions Prescribed: 3
Plan Total Prescribed Dose: 54 Gy
Reference Point Dosage Given to Date: 18 Gy
Reference Point Session Dosage Given: 18 Gy
Session Number: 1

## 2023-07-18 ENCOUNTER — Ambulatory Visit: Admitting: Radiation Oncology

## 2023-07-18 ENCOUNTER — Other Ambulatory Visit (HOSPITAL_BASED_OUTPATIENT_CLINIC_OR_DEPARTMENT_OTHER): Payer: Self-pay | Admitting: Family

## 2023-07-18 ENCOUNTER — Other Ambulatory Visit: Payer: Self-pay | Admitting: Nurse Practitioner

## 2023-07-18 ENCOUNTER — Other Ambulatory Visit: Payer: Self-pay

## 2023-07-18 DIAGNOSIS — E785 Hyperlipidemia, unspecified: Secondary | ICD-10-CM

## 2023-07-18 DIAGNOSIS — I10 Essential (primary) hypertension: Secondary | ICD-10-CM

## 2023-07-18 DIAGNOSIS — I25118 Atherosclerotic heart disease of native coronary artery with other forms of angina pectoris: Secondary | ICD-10-CM

## 2023-07-18 MED ORDER — ATORVASTATIN CALCIUM 80 MG PO TABS
80.0000 mg | ORAL_TABLET | Freq: Every day | ORAL | 3 refills | Status: AC
  Filled 2023-07-18 – 2023-07-24 (×3): qty 30, 30d supply, fill #0
  Filled 2023-08-13 – 2023-08-23 (×2): qty 30, 30d supply, fill #1
  Filled 2023-09-25: qty 30, 30d supply, fill #2
  Filled 2023-10-22 – 2023-10-31 (×3): qty 30, 30d supply, fill #3
  Filled 2023-11-26: qty 30, 30d supply, fill #4
  Filled 2023-12-24 – 2023-12-31 (×3): qty 30, 30d supply, fill #5
  Filled 2024-01-25: qty 30, 30d supply, fill #6
  Filled 2024-02-25: qty 30, 30d supply, fill #7

## 2023-07-18 MED ORDER — MONTELUKAST SODIUM 10 MG PO TABS
10.0000 mg | ORAL_TABLET | Freq: Every day | ORAL | 5 refills | Status: DC
  Filled 2023-07-18 – 2023-07-24 (×3): qty 30, 30d supply, fill #0
  Filled 2023-08-13 – 2023-08-23 (×2): qty 30, 30d supply, fill #1
  Filled 2023-09-25: qty 30, 30d supply, fill #2
  Filled 2023-10-22 – 2023-10-31 (×3): qty 30, 30d supply, fill #3
  Filled 2023-11-26: qty 30, 30d supply, fill #4
  Filled 2023-12-24 – 2023-12-31 (×3): qty 30, 30d supply, fill #5

## 2023-07-18 MED ORDER — NEBIVOLOL HCL 5 MG PO TABS
5.0000 mg | ORAL_TABLET | Freq: Every day | ORAL | 3 refills | Status: AC
  Filled 2023-07-18 – 2023-07-24 (×3): qty 30, 30d supply, fill #0
  Filled 2023-08-13 – 2023-08-23 (×2): qty 30, 30d supply, fill #1
  Filled 2023-09-25: qty 30, 30d supply, fill #2
  Filled 2023-10-22 – 2023-10-31 (×3): qty 30, 30d supply, fill #3
  Filled 2023-11-26: qty 30, 30d supply, fill #4
  Filled 2023-12-24 – 2023-12-31 (×3): qty 30, 30d supply, fill #5
  Filled 2024-01-25: qty 30, 30d supply, fill #6
  Filled 2024-02-25: qty 30, 30d supply, fill #7

## 2023-07-19 ENCOUNTER — Other Ambulatory Visit: Payer: Self-pay

## 2023-07-19 ENCOUNTER — Ambulatory Visit
Admission: RE | Admit: 2023-07-19 | Discharge: 2023-07-19 | Disposition: A | Discharge status: Home / Self Care | Source: Ambulatory Visit | Attending: Radiation Oncology | Admitting: Radiation Oncology

## 2023-07-19 DIAGNOSIS — Z510 Encounter for antineoplastic radiation therapy: Secondary | ICD-10-CM | POA: Diagnosis not present

## 2023-07-19 DIAGNOSIS — C3431 Malignant neoplasm of lower lobe, right bronchus or lung: Secondary | ICD-10-CM | POA: Diagnosis not present

## 2023-07-19 LAB — RAD ONC ARIA SESSION SUMMARY
Course Elapsed Days: 2
Plan Fractions Treated to Date: 2
Plan Prescribed Dose Per Fraction: 18 Gy
Plan Total Fractions Prescribed: 3
Plan Total Prescribed Dose: 54 Gy
Reference Point Dosage Given to Date: 36 Gy
Reference Point Session Dosage Given: 18 Gy
Session Number: 2

## 2023-07-23 ENCOUNTER — Other Ambulatory Visit: Payer: Self-pay

## 2023-07-23 ENCOUNTER — Other Ambulatory Visit: Payer: Self-pay | Admitting: Infectious Disease

## 2023-07-23 DIAGNOSIS — B20 Human immunodeficiency virus [HIV] disease: Secondary | ICD-10-CM

## 2023-07-23 MED ORDER — DOVATO 50-300 MG PO TABS
1.0000 | ORAL_TABLET | Freq: Every day | ORAL | 0 refills | Status: DC
  Filled 2023-07-23 (×2): qty 30, 30d supply, fill #0

## 2023-07-23 NOTE — Progress Notes (Signed)
 Specialty Pharmacy Refill Coordination Note  Kevin [name] is a 68 y.o. male contacted today regarding refills of specialty medication(s) Dolutegravir -lamiVUDine  (Dovato )   Patient requested Delivery   Delivery date: 07/26/23   Verified address: [address]   [name] [id number]   Medication will be filled on 07/25/23.     This fill date is pending response to refill request from provider. Patient is aware and if they have not received fill by intended date they must follow up with pharmacy.

## 2023-07-24 ENCOUNTER — Ambulatory Visit
Admission: RE | Admit: 2023-07-24 | Discharge: 2023-07-24 | Disposition: A | Discharge status: Home / Self Care | Source: Ambulatory Visit | Attending: Radiation Oncology

## 2023-07-24 ENCOUNTER — Other Ambulatory Visit (HOSPITAL_COMMUNITY): Payer: Self-pay

## 2023-07-24 ENCOUNTER — Ambulatory Visit
Admission: RE | Admit: 2023-07-24 | Discharge: 2023-07-24 | Disposition: A | Discharge status: Home / Self Care | Source: Ambulatory Visit | Attending: Radiation Oncology | Admitting: Radiation Oncology

## 2023-07-24 ENCOUNTER — Other Ambulatory Visit: Payer: Self-pay

## 2023-07-24 DIAGNOSIS — Z510 Encounter for antineoplastic radiation therapy: Secondary | ICD-10-CM | POA: Diagnosis not present

## 2023-07-24 DIAGNOSIS — F17210 Nicotine dependence, cigarettes, uncomplicated: Secondary | ICD-10-CM | POA: Diagnosis not present

## 2023-07-24 DIAGNOSIS — C3431 Malignant neoplasm of lower lobe, right bronchus or lung: Secondary | ICD-10-CM | POA: Diagnosis not present

## 2023-07-24 LAB — RAD ONC ARIA SESSION SUMMARY
Course Elapsed Days: 7
Plan Fractions Treated to Date: 3
Plan Prescribed Dose Per Fraction: 18 Gy
Plan Total Fractions Prescribed: 3
Plan Total Prescribed Dose: 54 Gy
Reference Point Dosage Given to Date: 54 Gy
Reference Point Session Dosage Given: 18 Gy
Session Number: 3

## 2023-07-25 DIAGNOSIS — Z131 Encounter for screening for diabetes mellitus: Secondary | ICD-10-CM | POA: Diagnosis not present

## 2023-07-25 DIAGNOSIS — M549 Dorsalgia, unspecified: Secondary | ICD-10-CM | POA: Diagnosis not present

## 2023-07-25 DIAGNOSIS — R0602 Shortness of breath: Secondary | ICD-10-CM | POA: Diagnosis not present

## 2023-07-25 DIAGNOSIS — E7800 Pure hypercholesterolemia, unspecified: Secondary | ICD-10-CM | POA: Diagnosis not present

## 2023-07-25 DIAGNOSIS — E559 Vitamin D deficiency, unspecified: Secondary | ICD-10-CM | POA: Diagnosis not present

## 2023-07-25 DIAGNOSIS — M129 Arthropathy, unspecified: Secondary | ICD-10-CM | POA: Diagnosis not present

## 2023-07-25 DIAGNOSIS — I1A0 Resistant hypertension: Secondary | ICD-10-CM | POA: Diagnosis not present

## 2023-07-25 DIAGNOSIS — C3490 Malignant neoplasm of unspecified part of unspecified bronchus or lung: Secondary | ICD-10-CM | POA: Diagnosis not present

## 2023-07-25 DIAGNOSIS — Z79899 Other long term (current) drug therapy: Secondary | ICD-10-CM | POA: Diagnosis not present

## 2023-07-25 DIAGNOSIS — Z1159 Encounter for screening for other viral diseases: Secondary | ICD-10-CM | POA: Diagnosis not present

## 2023-07-25 DIAGNOSIS — R5383 Other fatigue: Secondary | ICD-10-CM | POA: Diagnosis not present

## 2023-07-25 DIAGNOSIS — D539 Nutritional anemia, unspecified: Secondary | ICD-10-CM | POA: Diagnosis not present

## 2023-07-25 NOTE — Radiation Completion Notes (Addendum)
  Radiation Oncology         [phone] ________________________________  Name: Kevin [name] MRN: [id number]  Date of Service: 07/24/2023  DOB: [birth year]  End of Treatment Note  Diagnosis: Stage IA2, cT1bN0M0,  NSCLC, adenocarcinoma of the RLL  Intent: Curative     ==========DELIVERED PLANS==========  First Treatment Date: 2023-07-17 Last Treatment Date: 2023-07-24   Plan Name: Lung_R_SBRT Site: Lung, Right Technique: SBRT/SRT-IMRT Mode: Photon Dose Per Fraction: 18 Gy Prescribed Dose (Delivered / Prescribed): 54 Gy / 54 Gy Prescribed Fxs (Delivered / Prescribed): 3 / 3     ==========ON TREATMENT VISIT DATES========== 2023-07-17, 2023-07-19, 2023-07-24, 2023-07-24    See weekly On Treatment Notes in Epic for details in the Media tab (listed as Progress notes on the On Treatment Visit Dates listed above). The patient tolerated radiation.    The patient will receive a call in about one month from the radiation oncology department. He will continue follow up with Dr. Marguerita Shih as well.      Shelvia Dick, PAC

## 2023-07-26 DIAGNOSIS — Z79899 Other long term (current) drug therapy: Secondary | ICD-10-CM | POA: Diagnosis not present

## 2023-08-01 ENCOUNTER — Inpatient Hospital Stay (HOSPITAL_COMMUNITY)
Admission: EM | Admit: 2023-08-01 | Discharge: 2023-08-03 | DRG: 305 | Disposition: A | Discharge status: Home / Self Care | Attending: Internal Medicine | Admitting: Internal Medicine

## 2023-08-01 ENCOUNTER — Encounter (HOSPITAL_COMMUNITY): Payer: Self-pay | Admitting: Internal Medicine

## 2023-08-01 ENCOUNTER — Emergency Department (HOSPITAL_COMMUNITY)

## 2023-08-01 ENCOUNTER — Other Ambulatory Visit: Payer: Self-pay

## 2023-08-01 DIAGNOSIS — F1910 Other psychoactive substance abuse, uncomplicated: Secondary | ICD-10-CM | POA: Diagnosis present

## 2023-08-01 DIAGNOSIS — G319 Degenerative disease of nervous system, unspecified: Secondary | ICD-10-CM | POA: Diagnosis not present

## 2023-08-01 DIAGNOSIS — Z91199 Patient's noncompliance with other medical treatment and regimen due to unspecified reason: Secondary | ICD-10-CM

## 2023-08-01 DIAGNOSIS — I5032 Chronic diastolic (congestive) heart failure: Secondary | ICD-10-CM | POA: Diagnosis present

## 2023-08-01 DIAGNOSIS — I639 Cerebral infarction, unspecified: Secondary | ICD-10-CM | POA: Diagnosis not present

## 2023-08-01 DIAGNOSIS — N1832 Chronic kidney disease, stage 3b: Secondary | ICD-10-CM | POA: Diagnosis present

## 2023-08-01 DIAGNOSIS — Z888 Allergy status to other drugs, medicaments and biological substances status: Secondary | ICD-10-CM

## 2023-08-01 DIAGNOSIS — Z602 Problems related to living alone: Secondary | ICD-10-CM | POA: Diagnosis present

## 2023-08-01 DIAGNOSIS — F17210 Nicotine dependence, cigarettes, uncomplicated: Secondary | ICD-10-CM | POA: Diagnosis present

## 2023-08-01 DIAGNOSIS — R29702 NIHSS score 2: Secondary | ICD-10-CM | POA: Diagnosis present

## 2023-08-01 DIAGNOSIS — I739 Peripheral vascular disease, unspecified: Secondary | ICD-10-CM | POA: Diagnosis present

## 2023-08-01 DIAGNOSIS — E785 Hyperlipidemia, unspecified: Secondary | ICD-10-CM | POA: Diagnosis present

## 2023-08-01 DIAGNOSIS — M4802 Spinal stenosis, cervical region: Secondary | ICD-10-CM | POA: Diagnosis not present

## 2023-08-01 DIAGNOSIS — E639 Nutritional deficiency, unspecified: Secondary | ICD-10-CM

## 2023-08-01 DIAGNOSIS — Z85528 Personal history of other malignant neoplasm of kidney: Secondary | ICD-10-CM | POA: Diagnosis not present

## 2023-08-01 DIAGNOSIS — B20 Human immunodeficiency virus [HIV] disease: Secondary | ICD-10-CM | POA: Diagnosis present

## 2023-08-01 DIAGNOSIS — J439 Emphysema, unspecified: Secondary | ICD-10-CM | POA: Diagnosis present

## 2023-08-01 DIAGNOSIS — I480 Paroxysmal atrial fibrillation: Secondary | ICD-10-CM | POA: Diagnosis present

## 2023-08-01 DIAGNOSIS — N179 Acute kidney failure, unspecified: Secondary | ICD-10-CM | POA: Diagnosis present

## 2023-08-01 DIAGNOSIS — N2889 Other specified disorders of kidney and ureter: Secondary | ICD-10-CM | POA: Diagnosis present

## 2023-08-01 DIAGNOSIS — I161 Hypertensive emergency: Secondary | ICD-10-CM | POA: Diagnosis present

## 2023-08-01 DIAGNOSIS — Z79899 Other long term (current) drug therapy: Secondary | ICD-10-CM | POA: Diagnosis not present

## 2023-08-01 DIAGNOSIS — Z7982 Long term (current) use of aspirin: Secondary | ICD-10-CM

## 2023-08-01 DIAGNOSIS — R9431 Abnormal electrocardiogram [ECG] [EKG]: Secondary | ICD-10-CM | POA: Diagnosis not present

## 2023-08-01 DIAGNOSIS — J4489 Other specified chronic obstructive pulmonary disease: Secondary | ICD-10-CM | POA: Diagnosis present

## 2023-08-01 DIAGNOSIS — H538 Other visual disturbances: Secondary | ICD-10-CM | POA: Diagnosis not present

## 2023-08-01 DIAGNOSIS — G8929 Other chronic pain: Secondary | ICD-10-CM | POA: Diagnosis present

## 2023-08-01 DIAGNOSIS — G4730 Sleep apnea, unspecified: Secondary | ICD-10-CM | POA: Diagnosis present

## 2023-08-01 DIAGNOSIS — C3431 Malignant neoplasm of lower lobe, right bronchus or lung: Secondary | ICD-10-CM | POA: Diagnosis present

## 2023-08-01 DIAGNOSIS — M50222 Other cervical disc displacement at C5-C6 level: Secondary | ICD-10-CM | POA: Diagnosis not present

## 2023-08-01 DIAGNOSIS — R531 Weakness: Secondary | ICD-10-CM | POA: Diagnosis not present

## 2023-08-01 DIAGNOSIS — M109 Gout, unspecified: Secondary | ICD-10-CM | POA: Diagnosis present

## 2023-08-01 DIAGNOSIS — Z8673 Personal history of transient ischemic attack (TIA), and cerebral infarction without residual deficits: Secondary | ICD-10-CM

## 2023-08-01 DIAGNOSIS — I130 Hypertensive heart and chronic kidney disease with heart failure and stage 1 through stage 4 chronic kidney disease, or unspecified chronic kidney disease: Secondary | ICD-10-CM | POA: Diagnosis present

## 2023-08-01 DIAGNOSIS — R42 Dizziness and giddiness: Secondary | ICD-10-CM | POA: Diagnosis not present

## 2023-08-01 DIAGNOSIS — Z8269 Family history of other diseases of the musculoskeletal system and connective tissue: Secondary | ICD-10-CM

## 2023-08-01 DIAGNOSIS — M5030 Other cervical disc degeneration, unspecified cervical region: Secondary | ICD-10-CM | POA: Diagnosis not present

## 2023-08-01 DIAGNOSIS — M50223 Other cervical disc displacement at C6-C7 level: Secondary | ICD-10-CM | POA: Diagnosis not present

## 2023-08-01 DIAGNOSIS — Z7951 Long term (current) use of inhaled steroids: Secondary | ICD-10-CM

## 2023-08-01 DIAGNOSIS — R9089 Other abnormal findings on diagnostic imaging of central nervous system: Secondary | ICD-10-CM | POA: Diagnosis not present

## 2023-08-01 DIAGNOSIS — Z8249 Family history of ischemic heart disease and other diseases of the circulatory system: Secondary | ICD-10-CM | POA: Diagnosis not present

## 2023-08-01 DIAGNOSIS — I10 Essential (primary) hypertension: Secondary | ICD-10-CM

## 2023-08-01 DIAGNOSIS — Z720 Tobacco use: Secondary | ICD-10-CM | POA: Diagnosis present

## 2023-08-01 DIAGNOSIS — R9082 White matter disease, unspecified: Secondary | ICD-10-CM | POA: Diagnosis not present

## 2023-08-01 DIAGNOSIS — I6521 Occlusion and stenosis of right carotid artery: Secondary | ICD-10-CM | POA: Diagnosis not present

## 2023-08-01 DIAGNOSIS — R29818 Other symptoms and signs involving the nervous system: Secondary | ICD-10-CM | POA: Diagnosis not present

## 2023-08-01 DIAGNOSIS — M545 Low back pain, unspecified: Secondary | ICD-10-CM | POA: Diagnosis present

## 2023-08-01 LAB — CBC
HCT: 37.3 % — ABNORMAL LOW (ref 39.0–52.0)
Hemoglobin: 12.6 g/dL — ABNORMAL LOW (ref 13.0–17.0)
MCH: 32.4 pg (ref 26.0–34.0)
MCHC: 33.8 g/dL (ref 30.0–36.0)
MCV: 95.9 fL (ref 80.0–100.0)
Platelets: 267 10*3/uL (ref 150–400)
RBC: 3.89 MIL/uL — ABNORMAL LOW (ref 4.22–5.81)
RDW: 14.6 % (ref 11.5–15.5)
WBC: 7.4 10*3/uL (ref 4.0–10.5)
nRBC: 0 % (ref 0.0–0.2)

## 2023-08-01 LAB — COMPREHENSIVE METABOLIC PANEL WITH GFR
ALT: 16 U/L (ref 0–44)
AST: 21 U/L (ref 15–41)
Albumin: 3.7 g/dL (ref 3.5–5.0)
Alkaline Phosphatase: 65 U/L (ref 38–126)
Anion gap: 6 (ref 5–15)
BUN: 19 mg/dL (ref 8–23)
CO2: 20 mmol/L — ABNORMAL LOW (ref 22–32)
Calcium: 9.3 mg/dL (ref 8.9–10.3)
Chloride: 115 mmol/L — ABNORMAL HIGH (ref 98–111)
Creatinine, Ser: 2.48 mg/dL — ABNORMAL HIGH (ref 0.61–1.24)
GFR, Estimated: 28 mL/min — ABNORMAL LOW (ref >60)
Glucose, Bld: 98 mg/dL (ref 70–99)
Potassium: 4.7 mmol/L (ref 3.5–5.1)
Sodium: 141 mmol/L (ref 135–145)
Total Bilirubin: 0.9 mg/dL (ref 0.0–1.2)
Total Protein: 7.5 g/dL (ref 6.5–8.1)

## 2023-08-01 LAB — ETHANOL: Alcohol, Ethyl (B): <15 mg/dL (ref <15)

## 2023-08-01 LAB — CREATININE, SERUM
Creatinine, Ser: 2.57 mg/dL — ABNORMAL HIGH (ref 0.61–1.24)
GFR, Estimated: 27 mL/min — ABNORMAL LOW (ref >60)

## 2023-08-01 LAB — CBG MONITORING, ED: Glucose-Capillary: 86 mg/dL (ref 70–99)

## 2023-08-01 LAB — I-STAT CHEM 8, ED
BUN: 24 mg/dL — ABNORMAL HIGH (ref 8–23)
Calcium, Ion: 1.18 mmol/L (ref 1.15–1.40)
Chloride: 115 mmol/L — ABNORMAL HIGH (ref 98–111)
Creatinine, Ser: 2.5 mg/dL — ABNORMAL HIGH (ref 0.61–1.24)
Glucose, Bld: 93 mg/dL (ref 70–99)
HCT: 38 % — ABNORMAL LOW (ref 39.0–52.0)
Hemoglobin: 12.9 g/dL — ABNORMAL LOW (ref 13.0–17.0)
Potassium: 4.9 mmol/L (ref 3.5–5.1)
Sodium: 143 mmol/L (ref 135–145)
TCO2: 19 mmol/L — ABNORMAL LOW (ref 22–32)

## 2023-08-01 LAB — DIFFERENTIAL
Abs Immature Granulocytes: 0.01 10*3/uL (ref 0.00–0.07)
Basophils Absolute: 0 10*3/uL (ref 0.0–0.1)
Basophils Relative: 1 %
Eosinophils Absolute: 0.6 10*3/uL — ABNORMAL HIGH (ref 0.0–0.5)
Eosinophils Relative: 8 %
Immature Granulocytes: 0 %
Lymphocytes Relative: 21 %
Lymphs Abs: 1.5 10*3/uL (ref 0.7–4.0)
Monocytes Absolute: 0.5 10*3/uL (ref 0.1–1.0)
Monocytes Relative: 6 %
Neutro Abs: 4.8 10*3/uL (ref 1.7–7.7)
Neutrophils Relative %: 64 %

## 2023-08-01 LAB — APTT: aPTT: 34 s (ref 24–36)

## 2023-08-01 LAB — PROTIME-INR
INR: 1.1 (ref 0.8–1.2)
Prothrombin Time: 14.6 s (ref 11.4–15.2)

## 2023-08-01 MED ORDER — IRBESARTAN 150 MG PO TABS
300.0000 mg | ORAL_TABLET | Freq: Every day | ORAL | Status: DC
  Administered 2023-08-02 – 2023-08-03 (×2): 300 mg via ORAL
  Filled 2023-08-01 (×2): qty 2

## 2023-08-01 MED ORDER — NEBIVOLOL HCL 5 MG PO TABS
5.0000 mg | ORAL_TABLET | Freq: Every day | ORAL | Status: DC
  Administered 2023-08-02 – 2023-08-03 (×2): 5 mg via ORAL
  Filled 2023-08-01 (×2): qty 1

## 2023-08-01 MED ORDER — BUDESON-GLYCOPYRROL-FORMOTEROL 160-9-4.8 MCG/ACT IN AERO
2.0000 | INHALATION_SPRAY | Freq: Two times a day (BID) | RESPIRATORY_TRACT | Status: DC
  Administered 2023-08-02 – 2023-08-03 (×3): 2 via RESPIRATORY_TRACT
  Filled 2023-08-01: qty 5.9

## 2023-08-01 MED ORDER — IOHEXOL 350 MG/ML SOLN
60.0000 mL | Freq: Once | INTRAVENOUS | Status: AC | PRN
  Administered 2023-08-01: 60 mL via INTRAVENOUS

## 2023-08-01 MED ORDER — HYDRALAZINE HCL 20 MG/ML IJ SOLN
10.0000 mg | INTRAMUSCULAR | Status: AC
  Administered 2023-08-01: 10 mg via INTRAVENOUS
  Filled 2023-08-01: qty 1

## 2023-08-01 MED ORDER — AMLODIPINE BESYLATE-VALSARTAN 10-320 MG PO TABS: 1.0000 | ORAL_TABLET | Freq: Every day | ORAL | Status: DC

## 2023-08-01 MED ORDER — DOLUTEGRAVIR-LAMIVUDINE 50-300 MG PO TABS
1.0000 | ORAL_TABLET | Freq: Every day | ORAL | Status: DC
  Administered 2023-08-02 – 2023-08-03 (×2): 1 via ORAL
  Filled 2023-08-01 (×2): qty 1

## 2023-08-01 MED ORDER — NITROGLYCERIN IN D5W 200-5 MCG/ML-% IV SOLN
0.0000 ug/min | INTRAVENOUS | Status: DC
  Administered 2023-08-01: 5 ug/min via INTRAVENOUS
  Administered 2023-08-02: 55 ug/min via INTRAVENOUS
  Filled 2023-08-01 (×2): qty 250

## 2023-08-01 MED ORDER — AMLODIPINE BESYLATE 10 MG PO TABS
10.0000 mg | ORAL_TABLET | Freq: Every day | ORAL | Status: DC
  Administered 2023-08-02 – 2023-08-03 (×2): 10 mg via ORAL
  Filled 2023-08-01 (×2): qty 1

## 2023-08-01 MED ORDER — HYDRALAZINE HCL 25 MG PO TABS
25.0000 mg | ORAL_TABLET | Freq: Three times a day (TID) | ORAL | Status: DC
  Administered 2023-08-01 – 2023-08-02 (×3): 25 mg via ORAL
  Filled 2023-08-01 (×4): qty 1

## 2023-08-01 MED ORDER — IPRATROPIUM-ALBUTEROL 0.5-2.5 (3) MG/3ML IN SOLN: 3.0000 mL | RESPIRATORY_TRACT | Status: DC | PRN

## 2023-08-01 MED ORDER — HEPARIN SODIUM (PORCINE) 5000 UNIT/ML IJ SOLN
5000.0000 [IU] | Freq: Three times a day (TID) | INTRAMUSCULAR | Status: DC
  Administered 2023-08-01 – 2023-08-02 (×3): 5000 [IU] via SUBCUTANEOUS
  Filled 2023-08-01 (×4): qty 1

## 2023-08-01 NOTE — Consult Note (Signed)
 NEUROLOGY CONSULT NOTE   Date of service: August 01, 2023 Patient Name: Kevin [name] MRN:  [id number] DOB:  04-16-1955 Chief Complaint: Code Stroke Requesting Provider: Dorenda Gandy, MD  History of Present Illness  Kevin [name] is a 68 y.o. male with hx of HIV, AIDS, CHF, CKD stage III, COPD, peripheral arterial disease, lung cancer, and paroxysmal atrial fibrillation  presenting with weakness. He woke up feeling weak and he went to bed at midnight last night. He did not take any of his medication this morning.  He was able to get a hold of one of the community health paramedics in his apartment complex after she finished seeing a different patient and he had her take his blood pressure.  His blood pressure was 220/110 and the paramedic activated a code stroke.  He is currently undergoing radiation for adenocarcinoma of the right lower lung; he is followed by Dr. Marguerita Shih at the [facility] and Dr. [name] with pulmonology.  LKW: Midnight  Modified rankin score: 0-Completely asymptomatic and back to baseline post- stroke IV Thrombolysis: No, outside of the window EVT: No, no LVO  NIHSS components Score: Comment  1a Level of Conscious 0[]  1[]  2[]  3[]      1b LOC Questions 0[]  1[]  2[]       1c LOC Commands 0[]  1[]  2[]       2 Best Gaze 0[]  1[]  2[]       3 Visual 0[]  1[]  2[]  3[]      4 Facial Palsy 0[]  1[]  2[]  3[]      5a Motor Arm - left 0[]  1[]  2[]  3[]  4[]  UN[]    5b Motor Arm - Right 0[]  1[]  2[]  3[]  4[]  UN[]    6a Motor Leg - Left 0[]  1[x]  2[]  3[]  4[]  UN[]    6b Motor Leg - Right 0[]  1[x]  2[]  3[]  4[]  UN[]    7 Limb Ataxia 0[]  1[]  2[]  UN[]      8 Sensory 0[]  1[]  2[]  UN[]      9 Best Language 0[]  1[]  2[]  3[]      10 Dysarthria 0[]  1[]  2[]  UN[]      11 Extinct. and Inattention 0[]  1[]  2[]       TOTAL:2       ROS  Comprehensive ROS performed and pertinent positives documented in HPI   Past History   Past Medical History:  Diagnosis Date   AIDS (acquired immune deficiency syndrome)  (HCC) 08/17/2016   Amaurosis fugax 08/18/2022   Chronic diastolic CHF (congestive heart failure), NYHA class 3 (HCC) 01/2016   Chronic lower back pain    CKD (chronic kidney disease), stage III (HCC)    stage 3b   COPD (chronic obstructive pulmonary disease) (HCC)    Dyspnea    Gout    forearms, hands, ankles, feet (06/05/2016)   Headache    weekly (06/05/2016)   Heart murmur    never has caused any problems per pt   History of herpes zoster virus 01/2023   Hypertension    Hypertensive crisis 08/15/2017   Lipoma 07/06/2022   Neuromuscular disorder (HCC) 02/05/2023   post herpetic neuralgia    -  Primary   OSA on CPAP    does not use CPAP   PAD (peripheral artery disease) (HCC)    PAF (paroxysmal atrial fibrillation) (HCC) 01/2016   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   Pulmonary nodules 02/2023   Substance abuse (HCC)    in past    Past Surgical History:  Procedure Laterality Date   BRONCHIAL BIOPSY  06/11/2023   Procedure: BRONCHOSCOPY, WITH BIOPSY;  Surgeon: [name], MD;  Location: [facility] ENDOSCOPY;  Service: Pulmonary;;   BRONCHIAL BRUSHINGS  06/11/2023   Procedure: BRONCHOSCOPY, WITH BRUSH BIOPSY;  Surgeon: [name], MD;  Location: [facility] ENDOSCOPY;  Service: Pulmonary;;   BRONCHIAL NEEDLE ASPIRATION BIOPSY  06/11/2023   Procedure: BRONCHOSCOPY, WITH NEEDLE ASPIRATION BIOPSY;  Surgeon: [name], MD;  Location: [facility] ENDOSCOPY;  Service: Pulmonary;;   BRONCHIAL WASHINGS  06/11/2023   Procedure: IRRIGATION, BRONCHUS;  Surgeon: [name], MD;  Location: [facility] ENDOSCOPY;  Service: Pulmonary;;   COLONOSCOPY N/A 04/24/2023   Procedure: COLONOSCOPY;  Surgeon: Ozell Blunt, MD;  Location: [facility] ENDOSCOPY;  Service: Gastroenterology;  Laterality: N/A;   IR RADIOLOGIST EVAL & MGMT  05/31/2021   IR RADIOLOGIST EVAL & MGMT  07/26/2021   LEFT HEART CATH AND CORONARY ANGIOGRAPHY N/A 10/23/2016   Procedure: LEFT HEART CATH AND CORONARY ANGIOGRAPHY;  Surgeon: Arleen Lacer, MD;  Location: [facility] INVASIVE CV LAB;  Service: Cardiovascular;  Laterality: N/A;   LOWER EXTREMITY ANGIOGRAPHY N/A 07/17/2016   Procedure: Lower Extremity Angiography;  Surgeon: Avanell Leigh, MD;  Location: [facility] INVASIVE CV LAB;  Service: Cardiovascular;  Laterality: N/A;   LOWER EXTREMITY INTERVENTION N/A 06/05/2016   Procedure: Lower Extremity Intervention;  Surgeon: Avanell Leigh, MD;  Location: [facility] INVASIVE CV LAB;  Service: Cardiovascular;  Laterality: N/A;   PERIPHERAL VASCULAR ATHERECTOMY Right 07/17/2016   Procedure: Peripheral Vascular Atherectomy;  Surgeon: Avanell Leigh, MD;  Location: [facility] INVASIVE CV LAB;  Service: Cardiovascular;  Laterality: Right;  SFA   PERIPHERAL VASCULAR INTERVENTION  06/05/2016   Procedure: Peripheral Vascular Intervention;  Surgeon: Avanell Leigh, MD;  Location: [facility] INVASIVE CV LAB;  Service: Cardiovascular;;  left SFA   POLYPECTOMY  04/24/2023   Procedure: POLYPECTOMY;  Surgeon: Ozell Blunt, MD;  Location: [facility] ENDOSCOPY;  Service: Gastroenterology;;   RADIOLOGY WITH ANESTHESIA Right 06/29/2021   Procedure: RIGHT RENAL CRYOABLATION;  Surgeon: Elene Griffes, MD;  Location: [facility] ORS;  Service: Anesthesiology;  Laterality: Right;   VIDEO BRONCHOSCOPY WITH ENDOBRONCHIAL NAVIGATION Right 06/11/2023   Procedure: VIDEO BRONCHOSCOPY WITH ENDOBRONCHIAL NAVIGATION;  Surgeon: [name], MD;  Location: [facility] ENDOSCOPY;  Service: Pulmonary;  Laterality: Right;  WITH FLUORO AND BIOPSY    Family History: Family History  Problem Relation Age of Onset   High blood pressure Mother    Lupus Mother    Seizures Daughter    Heart attack Daughter     Social History  reports that he has been smoking cigarettes. He has a 4.2 pack-year smoking history. He has never been exposed to tobacco smoke. He has never used smokeless tobacco. He reports that he does not currently use alcohol after a past usage of about 2.0 standard drinks of alcohol per week. He reports that he does  not currently use drugs.  No Active Allergies  Medications  No current facility-administered medications for this encounter.  Current Outpatient Medications:    albuterol  (VENTOLIN  HFA) 108 (90 Base) MCG/ACT inhaler, Inhale 1-2 puffs into the lungs every 6 (six) hours as needed for wheezing or shortness of breath., Disp: 6.7 g, Rfl: 3   albuterol  (VENTOLIN  HFA) 108 (90 Base) MCG/ACT inhaler, Inhale 2-4 puffs into the lungs every 3 (three) hours as needed for wheezing or shortness of breath., Disp: , Rfl:    amLODipine -valsartan  (EXFORGE ) 10-320 MG tablet, Take 1 tablet by mouth daily., Disp: 90 tablet, Rfl: 2   aspirin  EC (  ASPIRIN  LOW DOSE) 81 MG tablet, Take 1 tablet (81 mg total) by mouth daily, swallow whole, Disp: 30 tablet, Rfl: 12   Aspirin -Salicylamide-Caffeine (BC HEADACHE POWDER PO), Take 1 packet by mouth every 8 (eight) hours as needed (for headaches)., Disp: , Rfl:    atorvastatin  (LIPITOR) 80 MG tablet, Take 1 tablet (80 mg total) by mouth daily., Disp: 90 tablet, Rfl: 3   azithromycin  (ZITHROMAX ) 250 MG tablet, Take 1 tablet (250 mg total) by mouth daily. Take first 2 tablets together, then 1 every day until finished., Disp: 6 tablet, Rfl: 0   busPIRone  (BUSPAR ) 7.5 MG tablet, Take 1 tablet (7.5 mg total) by mouth in the morning and at bedtime., Disp: 180 tablet, Rfl: 0   ciclopirox  (PENLAC ) 8 % solution, Apply topically at bedtime. Apply over nail and surrounding skin. Apply daily over previous coat. After seven (7) days, may remove with alcohol and continue cycle., Disp: 6.6 mL, Rfl: 2   dolutegravir -lamiVUDine  (DOVATO ) 50-300 MG tablet, Take 1 tablet by mouth daily., Disp: 30 tablet, Rfl: 0   Fluticasone -Umeclidin-Vilant (TRELEGY ELLIPTA ) 200-62.5-25 MCG/ACT AEPB, Inhale 1 puff into the lungs daily., Disp: 60 each, Rfl: 5   Fluticasone -Umeclidin-Vilant (TRELEGY ELLIPTA ) 200-62.5-25 MCG/ACT AEPB, Inhale 1 puff into the lungs daily at 2 PM., Disp: , Rfl:    hydrALAZINE  (APRESOLINE )  25 MG tablet, Take 1 tablet (25 mg total) by mouth 2 (two) times daily., Disp: 180 tablet, Rfl: 3   ipratropium-albuterol  (DUONEB) 0.5-2.5 (3) MG/3ML SOLN, Take 3 mLs by nebulization every 6 (six) hours as needed. (Patient taking differently: Take 3 mLs by nebulization every 6 (six) hours as needed (for shortness of breath or wheezing).), Disp: 360 mL, Rfl: 12   ipratropium-albuterol  (DUONEB) 0.5-2.5 (3) MG/3ML SOLN, Take 3 mLs by nebulization every 4 (four) hours as needed., Disp: 360 mL, Rfl: 0   montelukast  (SINGULAIR ) 10 MG tablet, Take 1 tablet (10 mg total) by mouth at bedtime., Disp: 30 tablet, Rfl: 5   nebivolol  (BYSTOLIC ) 5 MG tablet, Take 1 tablet (5 mg total) by mouth daily., Disp: 90 tablet, Rfl: 3   pregabalin  (LYRICA ) 50 MG capsule, Take 1 capsule (50 mg total) by mouth at bedtime as needed., Disp: 30 capsule, Rfl: 0   Vitamin D , Ergocalciferol , (DRISDOL ) 1.25 MG (50000 UNIT) CAPS capsule, Take one capsule (50,000 Units total) by mouth once a week for 12 weeks, then recheck level in the office, Disp: 12 capsule, Rfl: 0  Vitals   Vitals:   08/01/23 1300  Weight: 72.4 kg    Body mass index is 21.06 kg/m.   Physical Exam   Constitutional: Appears well-developed and well-nourished.  Psych: Affect appropriate to situation.  Eyes: No scleral injection.  HENT: No OP obstruction.  Head: Normocephalic.  Cardiovascular: Normal rate and regular rhythm.  Respiratory: Effort normal, non-labored breathing.  GI: Soft.  No distension. There is no tenderness.  Skin: WDI.   Neurologic Examination   Neuro: Mental Status: Patient is awake, alert, oriented to person, place, month, year, and situation. Patient is able to give a clear and coherent history. No signs of aphasia or neglect Cranial Nerves: II: Visual Fields are full. Pupils are equal, round, and reactive to light.   III,IV, VI: EOMI without ptosis or diploplia.  V: Facial sensation is symmetric to temperature VII: Facial  movement is symmetric resting and smiling VIII: Hearing is intact to voice X: Palate elevates symmetrically XI: Shoulder shrug is symmetric. XII: Tongue protrudes midline without atrophy or fasciculations.  Motor: Tone is normal. Bulk is normal. No drift noted upper extremities however his hand grasp is weak bilaterally. Slight drift in bilateral lower extremities and bilateral foot drop. Sensory: Sensation is symmetric to light touch and temperature in the arms and legs. No extinction to DSS present.  Cerebellar: FNF and HKS are intact bilaterally   Labs/Imaging/Neurodiagnostic studies   CBC:  Recent Labs  Lab 08-Aug-2023 1330  HGB 12.9*  HCT 38.0*   Basic Metabolic Panel:  Lab Results  Component Value Date   NA 143 08-08-23   K 4.9 08-08-23   CO2 25 06/27/2023   GLUCOSE 93 08-Aug-2023   BUN 24 (H) August 08, 2023   CREATININE 2.50 (H) 08-Aug-2023   CALCIUM  8.6 (L) 06/27/2023   GFRNONAA 32 (L) 06/27/2023   GFRAA 35 (L) 04/05/2019   Lipid Panel:  Lab Results  Component Value Date   LDLCALC 97 10/24/2022   HgbA1c:  Lab Results  Component Value Date   HGBA1C 5.1 04/28/2022   Urine Drug Screen:     Component Value Date/Time   LABOPIA NONE DETECTED 12/28/2021 0950   COCAINSCRNUR POSITIVE (A) 12/28/2021 0950   LABBENZ NONE DETECTED 12/28/2021 0950   AMPHETMU NONE DETECTED 12/28/2021 0950   THCU NONE DETECTED 12/28/2021 0950   LABBARB NONE DETECTED 12/28/2021 0950    Alcohol Level No results found for: [facility] INR  Lab Results  Component Value Date   INR 1.2 07/14/2021   APTT  Lab Results  Component Value Date   APTT 33 07/14/2021    CT Head without contrast(Personally reviewed): 1. Mild to moderate cerebral white matter disease. No apparent acute process. 2. ASPECTS is 10.  CT angio Head and Neck with contrast(Personally reviewed): 1. Mild soft plaque within the right carotid bulb and origin of the right internal carotid artery, with less than 20% luminal  stenosis. 2. Normal CT angiogram of the head.  MRI Brain(Personally reviewed): 1. Age-related atrophy and moderately advanced diffuse cerebral white matter disease. 2. Technically limited study for the assessment of metastatic disease. There are no suspicious findings present.  MRI C Spine 1. Multilevel degenerative disc disease resulting in varying spinal canal and neural foraminal stenosis, but no apparent spinal cord or nerve root impingement.  ASSESSMENT   Kevin [name] is a 68 y.o. male with a past medical history significant for paraxysmal atrial fibrillation and lung cancer presenting with weakness in his hands and feet. This seems relatively acute in onset. He has slow responses and hypertensive to over 200 systolic. Has also not been eating and drinking a lot and glucose is borderline low.  His reflexes are intact, except for the noted hand grip weakness and foot dorsiflexion and plantar flexion weakness, he has no other deficits.  RECOMMENDATIONS  - MRI brain and MRI C Spine. ______________________________________________________________________    Arletha Bend, NP [facility]   NEUROHOSPITALIST ADDENDUM Performed a face to face diagnostic evaluation.   I have reviewed the contents of history and physical exam as documented by PA/ARNP/Resident and agree with above documentation.  I have discussed and formulated the above plan as documented. Edits to the note have been made as needed.  Impression/Key exam findings/Plan: on repeat exam, his hand and foot weakness has resolved. I do think there was some functional component to his earlier noted hand grip weakness. Reviewed MRI brain and C spine with no obvious mets. Reviewed CTA head and neck with no basilar thrombosis. Poor memory and attention that has been going on for  a long time maybe secondary to poor po intake and potential vitamin/micronutrient deficiency. Recommend starting on PO  multivitamin.  Tedrick Port, MD [facility] [phone]   If 7pm to 7am, please call on call as listed on AMION.

## 2023-08-01 NOTE — ED Notes (Signed)
 Pt to MRI

## 2023-08-01 NOTE — ED Provider Notes (Addendum)
 [name] EMERGENCY DEPARTMENT AT [facility] Provider Note   CSN: [id number] Arrival date & time: 08/01/23  1319  An emergency department physician performed an initial assessment on this suspected stroke patient at 1320.  Patient presents with: Weakness   Kevin [name] is a 68 y.o. male.   HPI Patient presents as a code stroke patient has been complaining of the dizziness today.  He does have a history of HIV, seemingly has been taking the medication as directed as well. He notes worsening weakness, bilateral upper extremity possibly lower extremity.    Prior to Admission medications   Medication Sig Start Date End Date Taking? Authorizing Provider  aspirin  EC (ASPIRIN  LOW DOSE) 81 MG tablet Take 1 tablet (81 mg total) by mouth daily, swallow whole 05/07/23  Yes Catheryn Cluck, MD  albuterol  (VENTOLIN  HFA) 108 (90 Base) MCG/ACT inhaler Inhale 1-2 puffs into the lungs every 6 (six) hours as needed for wheezing or shortness of breath. 03/14/23   Raejean Bullock, NP  albuterol  (VENTOLIN  HFA) 108 (90 Base) MCG/ACT inhaler Inhale 2-4 puffs into the lungs every 3 (three) hours as needed for wheezing or shortness of breath.    [provider]  amLODipine -valsartan  (EXFORGE ) 10-320 MG tablet Take 1 tablet by mouth daily. 04/19/23   Goodrich, Callie E, PA-C  Aspirin -Salicylamide-Caffeine (BC HEADACHE POWDER PO) Take 1 packet by mouth every 8 (eight) hours as needed (for headaches).    [provider]  atorvastatin  (LIPITOR) 80 MG tablet Take 1 tablet (80 mg total) by mouth daily. 07/18/23   Clearnce Curia, NP  azithromycin  (ZITHROMAX ) 250 MG tablet Take 1 tablet (250 mg total) by mouth daily. Take first 2 tablets together, then 1 every day until finished. 06/08/23   Starlene Eaton, FNP  busPIRone  (BUSPAR ) 7.5 MG tablet Take 1 tablet (7.5 mg total) by mouth in the morning and at bedtime. 06/26/23 09/24/23  Catheryn Cluck, MD  ciclopirox  (PENLAC ) 8 % solution  Apply topically at bedtime. Apply over nail and surrounding skin. Apply daily over previous coat. After seven (7) days, may remove with alcohol and continue cycle. 07/03/23   Charity Conch, DPM  dolutegravir -lamiVUDine  (DOVATO ) 50-300 MG tablet Take 1 tablet by mouth daily. 07/23/23   Charolette Copier, MD  Fluticasone -Umeclidin-Vilant (TRELEGY ELLIPTA ) 200-62.5-25 MCG/ACT AEPB Inhale 1 puff into the lungs daily. 09/08/22     Fluticasone -Umeclidin-Vilant (TRELEGY ELLIPTA ) 200-62.5-25 MCG/ACT AEPB Inhale 1 puff into the lungs daily at 2 PM. 06/06/23   Raejean Bullock, NP  hydrALAZINE  (APRESOLINE ) 25 MG tablet Take 1 tablet (25 mg total) by mouth 2 (two) times daily. 06/29/23 09/27/23  Walker, Caitlin S, NP  ipratropium-albuterol  (DUONEB) 0.5-2.5 (3) MG/3ML SOLN Take 3 mLs by nebulization every 6 (six) hours as needed. Patient taking differently: Take 3 mLs by nebulization every 6 (six) hours as needed (for shortness of breath or wheezing). 02/27/23   Rosa College D, MD  ipratropium-albuterol  (DUONEB) 0.5-2.5 (3) MG/3ML SOLN Take 3 mLs by nebulization every 4 (four) hours as needed. 06/08/23   Starlene Eaton, FNP  montelukast  (SINGULAIR ) 10 MG tablet Take 1 tablet (10 mg total) by mouth at bedtime. 07/18/23   Cobb, Mariah Shines, NP  nebivolol  (BYSTOLIC ) 5 MG tablet Take 1 tablet (5 mg total) by mouth daily. 07/18/23   Clearnce Curia, NP  pregabalin  (LYRICA ) 50 MG capsule Take 1 capsule (50 mg total) by mouth at bedtime as needed. 07/03/23   Bobbie Burows  R, DPM  Vitamin D , Ergocalciferol , (DRISDOL ) 1.25 MG (50000 UNIT) CAPS capsule Take one capsule (50,000 Units total) by mouth once a week for 12 weeks, then recheck level in the office 06/26/23       Allergies: Patient has no known allergies.    Review of Systems  Updated Vital Signs BP (!) 230/119   Pulse 65   Temp 98.1 F (36.7 C) (Oral)   Resp 15   Ht 6' (1.829 m)   Wt 72.4 kg   SpO2 100%   BMI 21.65 kg/m   Physical Exam Vitals  and nursing note reviewed.  Constitutional:      General: He is not in acute distress.    Appearance: He is well-developed.  HENT:     Head: Normocephalic and atraumatic.   Eyes:     Conjunctiva/sclera: Conjunctivae normal.    Cardiovascular:     Rate and Rhythm: Normal rate and regular rhythm.  Pulmonary:     Effort: Pulmonary effort is normal. No respiratory distress.     Breath sounds: No stridor.  Abdominal:     General: There is no distension.   Skin:    General: Skin is warm and dry.   Neurological:     Mental Status: He is alert and oriented to person, place, and time.     Comments: Atrophy in general, no focality.  Face is symmetric, speech is clear.    (all labs ordered are listed, but only abnormal results are displayed) Labs Reviewed  CBC - Abnormal; Notable for the following components:      Result Value   RBC 3.89 (*)    Hemoglobin 12.6 (*)    HCT 37.3 (*)    All other components within normal limits  DIFFERENTIAL - Abnormal; Notable for the following components:   Eosinophils Absolute 0.6 (*)    All other components within normal limits  COMPREHENSIVE METABOLIC PANEL WITH GFR - Abnormal; Notable for the following components:   Chloride 115 (*)    CO2 20 (*)    Creatinine, Ser 2.48 (*)    GFR, Estimated 28 (*)    All other components within normal limits  I-STAT CHEM 8, ED - Abnormal; Notable for the following components:   Chloride 115 (*)    BUN 24 (*)    Creatinine, Ser 2.50 (*)    TCO2 19 (*)    Hemoglobin 12.9 (*)    HCT 38.0 (*)    All other components within normal limits  PROTIME-INR  APTT  ETHANOL  CBG MONITORING, ED    EKG: None  Radiology: MR CERVICAL SPINE WO CONTRAST Result Date: 08/01/2023 CLINICAL DATA:  weakness EXAM: MRI CERVICAL SPINE WITHOUT CONTRAST TECHNIQUE: Multiplanar, multisequence MR imaging of the cervical spine was performed. No intravenous contrast was administered. COMPARISON:  None Available. FINDINGS:  Alignment: Anatomic. Vertebrae: Normal in height and signal intensity. No osseous lesions are present. Cord: Normal in morphology and signal intensity. Posterior Fossa, vertebral arteries, paraspinal tissues: Unremarkable. Disc levels: C2-3: Normal. C3-4: Right-sided uncovertebral joint hypertrophy resulting in moderate right neural foraminal stenosis. There is mild central spinal canal stenosis. The left neural foramen is patent. C4-5: Normal. C5-6: Degenerative disc bulging and mild uncovertebral joint hypertrophy, more pronounced on the left. Mild central spinal canal stenosis and mild-to-moderate left neural foraminal stenosis. C6-7: Mild right paracentral disc bulging with mild central spinal canal stenosis. The neural foramina are patent. C7-T1: Normal. IMPRESSION: 1. Multilevel degenerative disc disease resulting in varying spinal canal  and neural foraminal stenosis, but no apparent spinal cord or nerve root impingement. Electronically Signed   By: Maribeth Shivers M.D.   On: 08/01/2023 16:37   MR BRAIN WO CONTRAST Result Date: 08/01/2023 CLINICAL DATA:  Stroke, follow up eval for mets EXAM: MRI HEAD WITHOUT CONTRAST TECHNIQUE: Multiplanar, multiecho pulse sequences of the brain and surrounding structures were obtained without intravenous contrast. COMPARISON:  CT of the head dated August 01, 2023 and MRI of the brain dated October 25, 2022. FINDINGS: Brain: Mild generalized cerebral volume loss. Extensive periventricular and deep cerebral white matter disease. No evidence of restricted diffusion to indicate acute or recent infarction. No findings suspicious for metastatic disease, although the study is significantly limited by the absence of intravenous contrast. Vascular: Normal vascular flow voids. Skull and upper cervical spine: Normal marrow signal. No osseous lesion is evident. Sinuses/Orbits: Normal orbits.  Minimal paranasal sinus disease. Other: None. IMPRESSION: 1. Age-related atrophy and  moderately advanced diffuse cerebral white matter disease. 2. Technically limited study for the assessment of metastatic disease. There are no suspicious findings present. Electronically Signed   By: Maribeth Shivers M.D.   On: 08/01/2023 16:31   CT ANGIO HEAD NECK W WO CM (CODE STROKE) Result Date: 08/01/2023 CLINICAL DATA:  Acute neurological deficit.  Stroke suspected. EXAM: CT ANGIOGRAPHY HEAD AND NECK WITH AND WITHOUT CONTRAST TECHNIQUE: Multidetector CT imaging of the head and neck was performed using the standard protocol during bolus administration of intravenous contrast. Multiplanar CT image reconstructions and MIPs were obtained to evaluate the vascular anatomy. Carotid stenosis measurements (when applicable) are obtained utilizing NASCET criteria, using the distal internal carotid diameter as the denominator. RADIATION DOSE REDUCTION: This exam was performed according to the departmental dose-optimization program which includes automated exposure control, adjustment of the mA and/or kV according to patient size and/or use of iterative reconstruction technique. CONTRAST:  60mL OMNIPAQUE  IOHEXOL  350 MG/ML SOLN COMPARISON:  CT angiogram of the head and neck dated October 25, 2022. FINDINGS: CT HEAD FINDINGS Brain: Age-related atrophy and mild to moderate cerebral white matter disease. No evidence of hemorrhage, mass, cortical infarct or hydrocephalus. Vascular: No acute process. Skull: Intact and unremarkable. Sinuses/Orbits: Mild mucosal disease.  Normal orbits. Other: None. Review of the MIP images confirms the above findings CTA NECK FINDINGS Aortic arch: Negative to the extent demonstrated. Right carotid system: Mild circumferential soft plaque in the carotid bulb and origin of the internal carotid artery, with no flow limiting stenosis. Tortuous cervical segment of the right internal carotid artery, which is otherwise widely patent. Left carotid system: Normal in caliber. Mild tortuosity of the  cervical segment of the left internal carotid artery. Vertebral arteries: Normal in caliber and widely patent. Skeleton: Negative. Other neck: Unremarkable. Upper chest: Mild emphysema.  The visualized lungs are clear. Review of the MIP images confirms the above findings CTA HEAD FINDINGS Anterior circulation: The internal carotid arteries, anterior cerebral arteries and middle cerebral arteries are widely patent and normal in caliber. No evidence of aneurysm or flow-limiting stenosis. Posterior circulation: No significant stenosis, proximal occlusion, aneurysm, or vascular malformation. Venous sinuses: Patent and unremarkable. Anatomic variants: None. Review of the MIP images confirms the above findings IMPRESSION: 1. Mild soft plaque within the right carotid bulb and origin of the right internal carotid artery, with less than 20% luminal stenosis. 2. Normal CT angiogram of the head. These results were called by telephone at the time of interpretation on 08/01/2023 at 1:53 pm to provider DEVON SHAFER , who  verbally acknowledged these results. Electronically Signed   By: Maribeth Shivers M.D.   On: 08/01/2023 14:05   CT HEAD CODE STROKE WO CONTRAST Result Date: 08/01/2023 CLINICAL DATA:  Code stroke. EXAM: CT HEAD WITHOUT CONTRAST TECHNIQUE: Contiguous axial images were obtained from the base of the skull through the vertex without intravenous contrast. RADIATION DOSE REDUCTION: This exam was performed according to the departmental dose-optimization program which includes automated exposure control, adjustment of the mA and/or kV according to patient size and/or use of iterative reconstruction technique. COMPARISON:  CT of the head dated June 05, 2023. FINDINGS: Brain: Mild-to-moderate subcortical white matter disease. No evidence of hemorrhage, mass, acute cortical infarct or hydrocephalus. Vascular: No hyperdense vessel or unexpected calcification. Skull: Intact and unremarkable. Sinuses/Orbits: Negative.  Other: None. ASPECTS [facility] Stroke Program Early CT Score) - Ganglionic level infarction (caudate, lentiform nuclei, internal capsule, insula, M1-M3 cortex): 7 - Supraganglionic infarction (M4-M6 cortex): 3 Total score (0-10 with 10 being normal): 10 IMPRESSION: 1. Mild to moderate cerebral white matter disease. No apparent acute process. 2. ASPECTS is 10. 3. These results were called by telephone at the time of interpretation on 08/01/2023 at 1:53 pm to provider Russella Courts , who verbally acknowledged these results. Electronically Signed   By: Maribeth Shivers M.D.   On: 08/01/2023 13:55     Procedures   Medications Ordered in the ED  hydrALAZINE  (APRESOLINE ) injection 10 mg (has no administration in time range)  nitroGLYCERIN  50 mg in dextrose  5 % 250 mL (0.2 mg/mL) infusion (has no administration in time range)  iohexol  (OMNIPAQUE ) 350 MG/ML injection 60 mL (60 mLs Intravenous Contrast Given 08/01/23 1334)                                    Medical Decision Making Patient presents with new weakness in the context of hypertension.  Patient presents as a code stroke and CVA is a consideration.  Patient also history of lung cancer, though stage I per notes.  Concern for metastatic disease, versus hypertensive crisis as well given his blood pressure greater than 200 on arrival. Case discussed with our and managed with our neurology colleagues. Cardiac 65 sinus normal pulse ox 100% room air normal  Amount and/or Complexity of Data Reviewed Independent Historian: EMS External Data Reviewed: notes. Labs: ordered. Decision-making details documented in ED Course. Radiology: ordered and independent interpretation performed. Decision-making details documented in ED Course. ECG/medicine tests: ordered and independent interpretation performed. Decision-making details documented in ED Course.  Risk Prescription drug management. Decision regarding hospitalization. Diagnosis or treatment  significantly limited by social determinants of health.   4:49 PM Head CT initially reassuring, no bleed, Now MRI results are available, no evidence for acute infarct.  Patient has persistent hypertension, systolic greater than 220, has received home meds, hydralazine  will start nitroglycerin  drip for concern of hypertensive emergency given his weakness, presentation for this without evidence for acute stroke.  CRITICAL CARE Performed by: Dorenda Gandy Total critical care time: 35 minutes Critical care time was exclusive of separately billable procedures and treating other patients. Critical care was necessary to treat or prevent imminent or life-threatening deterioration. Critical care was time spent personally by me on the following activities: development of treatment plan with patient and/or surrogate as well as nursing, discussions with consultants, evaluation of patient's response to treatment, examination of patient, obtaining history from patient or surrogate, ordering and performing treatments and  interventions, ordering and review of laboratory studies, ordering and review of radiographic studies, pulse oximetry and re-evaluation of patient's condition.   Final diagnoses:  Hypertensive emergency  Weakness    ED Discharge Orders     None          Dorenda Gandy, MD 08/01/23 1649    Dorenda Gandy, MD 08/01/23 1650

## 2023-08-01 NOTE — H&P (Signed)
 History and Physical    Kevin [name] UXL:[id number] DOB: 1956/02/04 DOA: 08/01/2023  PCP: Catheryn Cluck, MD   Chief Complaint: Stroke-like symptoms  HPI: Kevin [name] is a 68 y.o. male with medical history significant of hypertension, HIV/AIDS, heart failure, chronic lumbago, COPD, hypertension, sleep apnea, A-fib, renal cell carcinoma 2023, substance abuse with recently diagnosed non-small cell lung cancer.  Patient presents to the hospital with strokelike symptoms including weakness and altered gait from baseline.  In ED patient was found to have profoundly elevated hypertension, emergent imaging did not show any acute intracranial findings consistent with stroke.  Initiated on nitro glycerin drip and hospitalist called for admission.  Review of Systems: As per HPI otherwise without notable nausea vomiting chest pain fevers or chills.   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)    Hypertensive emergency  Rule out acute CVA Rule out nerve impingement/injury -Presumed secondary to noncompliance given no other recent changes/etiology to explain his profoundly uncontrolled blood pressure -Resume home medications on current list - family to verify accuracy with pharmacy, family to count home meds to ensure compliance -Continue nitro drip, goal blood pressure 150-170/80-100 -MRI without acute findings, reassuring   -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-spine with multilevel degeneration with notable stenosis throughout without cord or root impingement - CTA head and neck without acute  findings/mild right internal carotid plaque with <20% stenosis  Goals of care  Medication compliance issues - Lengthy discussion with patient and family(two daughters) in regards to his current living situation - Patient currently lives alone, manages his own medications and ADLs - Patient receives blister pack medications directly from pharmacy and attempts to ensure medication compliance - Discussed that despite all this if patient continues to have issues with medication compliance and forgetfulness it may be reasonable to discuss having patient moved to an assisted living/facility to assist with management of his medications and general health.  Recommend following up with PCP Dr. Hildy Lowers to continue his conversation as a family  Heart failure, diastolic dysfunction, without acute exacerbation - Last echo 2023 EF 60 to 65% without wall motion abnormality, grade 1 diastolic dysfunction - No indication to repeat echo at this time - Appears euvolemic - continue fluids as below - low threshold to DC  AKI on CKD 3B  - Gentle IV fluids, monitor closely given known heart failure - Baseline creatinine around 1.7, GFR around 40-45 - Currently 2.5, GFR 28  Paroxysmal A-fib -CHA2DS2/VAS Stroke Risk = 5 - It remains unclear why patient is not currently anticoagulated, per chart review patient has CHA2DS2-VASc of 2 does not qualify for anticoagulation however on review his current CHA2DS2-VASc today is 5(age, TIA, hypertension, HF) - I see no history remarkable for bleeding or intolerance - Would strongly encourage patient to follow-up with PCP and cardiology to discuss risks versus benefits of anticoagulation appears to currently only be on low-dose aspirin   HIV/AIDS, well-controlled Lab Results  Component Value Date   HIV1RNAQUANT Not Detected 07/06/2022    Lab Results  Component Value Date   CD4TABS 842 07/06/2022  CD4TABS 1,104 12/07/2021   CD4TABS 918 06/15/2021   COPD without acute  exacerbation recently diagnosed non-small cell lung cancer History of renal cell carcinoma - Continue scheduled nebs, as needed albuterol  - Recommend close follow-up with primary oncologist Dr. Liam Redhead  Sleep apnea -continue CPAP as appropriate Substance abuse -former/history of    DVT prophylaxis: Heparin  Code Status: Full Family Communication: Daughter at bedside, daughter updated over the phone Status is: Inpatient  Dispo: The patient is from: Home              Anticipated d/c is to: To be determined              Anticipated d/c date is: 48 to 72 hours              Patient currently not medically stable for discharge given ongoing need for close monitoring and IV medications  Consultants:  None  Procedures:  None   Past Medical History:  Diagnosis Date   AIDS (acquired immune deficiency syndrome) (HCC) 08/17/2016   Amaurosis fugax 08/18/2022   Chronic diastolic CHF (congestive heart failure), NYHA class 3 (HCC) 01/2016   Chronic lower back pain    CKD (chronic kidney disease), stage III (HCC)    stage 3b   COPD (chronic obstructive pulmonary disease) (HCC)    Dyspnea    Gout    forearms, hands, ankles, feet (06/05/2016)   Headache    weekly (06/05/2016)   Heart murmur    never has caused any problems per pt   History of herpes zoster virus 01/2023   Hypertension    Hypertensive crisis 08/15/2017   Lipoma 07/06/2022   Neuromuscular disorder (HCC) 02/05/2023   post herpetic neuralgia    -  Primary   OSA on CPAP    does not use CPAP   PAD (peripheral artery disease) (HCC)    PAF (paroxysmal atrial fibrillation) (HCC) 01/2016   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   Pulmonary nodules 02/2023   Substance abuse (HCC)    in past    Past Surgical History:  Procedure Laterality Date   BRONCHIAL BIOPSY  06/11/2023   Procedure: BRONCHOSCOPY, WITH BIOPSY;  Surgeon: [name], MD;  Location: [facility] ENDOSCOPY;  Service: Pulmonary;;   BRONCHIAL  BRUSHINGS  06/11/2023   Procedure: BRONCHOSCOPY, WITH BRUSH BIOPSY;  Surgeon: [name], MD;  Location: [facility] ENDOSCOPY;  Service: Pulmonary;;   BRONCHIAL NEEDLE ASPIRATION BIOPSY  06/11/2023   Procedure: BRONCHOSCOPY, WITH NEEDLE ASPIRATION BIOPSY;  Surgeon: [name], MD;  Location: [facility] ENDOSCOPY;  Service: Pulmonary;;   BRONCHIAL WASHINGS  06/11/2023   Procedure: IRRIGATION, BRONCHUS;  Surgeon: [name], MD;  Location: [facility] ENDOSCOPY;  Service: Pulmonary;;   COLONOSCOPY N/A 04/24/2023   Procedure: COLONOSCOPY;  Surgeon: Ozell Blunt, MD;  Location: [facility] ENDOSCOPY;  Service: Gastroenterology;  Laterality: N/A;   IR RADIOLOGIST EVAL & MGMT  05/31/2021   IR RADIOLOGIST EVAL & MGMT  07/26/2021   LEFT HEART CATH AND CORONARY ANGIOGRAPHY N/A 10/23/2016   Procedure: LEFT HEART CATH AND CORONARY ANGIOGRAPHY;  Surgeon: Arleen Lacer, MD;  Location: [facility] INVASIVE CV LAB;  Service: Cardiovascular;  Laterality: N/A;   LOWER EXTREMITY ANGIOGRAPHY N/A 07/17/2016   Procedure: Lower Extremity Angiography;  Surgeon: Avanell Leigh, MD;  Location: [facility] INVASIVE CV LAB;  Service: Cardiovascular;  Laterality: N/A;   LOWER EXTREMITY INTERVENTION N/A 06/05/2016   Procedure: Lower Extremity Intervention;  Surgeon: Avanell Leigh, MD;  Location: [facility] INVASIVE CV  LAB;  Service: Cardiovascular;  Laterality: N/A;   PERIPHERAL VASCULAR ATHERECTOMY Right 07/17/2016   Procedure: Peripheral Vascular Atherectomy;  Surgeon: Avanell Leigh, MD;  Location: [facility] INVASIVE CV LAB;  Service: Cardiovascular;  Laterality: Right;  SFA   PERIPHERAL VASCULAR INTERVENTION  06/05/2016   Procedure: Peripheral Vascular Intervention;  Surgeon: Avanell Leigh, MD;  Location[facility] INVASIVE CV LAB;  Service: Cardiovascular;;  left SFA   POLYPECTOMY  04/24/2023   Procedure: POLYPECTOMY;  Surgeon: Ozell Blunt, MD;  Location: [facility];  Service: Gastroenterology;;   RADIOLOGY WITH ANESTHESIA Right 06/29/2021   Procedure: RIGHT RENAL  CRYOABLATION;  Surgeon: Elene Griffes, MD;  Location: [facility] ORS;  Service: Anesthesiology;  Laterality: Right;   VIDEO BRONCHOSCOPY WITH ENDOBRONCHIAL NAVIGATION Right 06/11/2023   Procedure: VIDEO BRONCHOSCOPY WITH ENDOBRONCHIAL NAVIGATION;  Surgeon: [name], MD;  Location: [facility] ENDOSCOPY;  Service: Pulmonary;  Laterality: Right;  WITH FLUORO AND BIOPSY     reports that he has been smoking cigarettes. He has a 4.2 pack-year smoking history. He has never been exposed to tobacco smoke. He has never used smokeless tobacco. He reports that he does not currently use alcohol after a past usage of about 2.0 standard drinks of alcohol per week. He reports that he does not currently use drugs.  No Known Allergies  Family History  Problem Relation Age of Onset   High blood pressure Mother    Lupus Mother    Seizures Daughter    Heart attack Daughter     Prior to Admission medications   Medication Sig Start Date End Date Taking? Authorizing Provider  aspirin  EC (ASPIRIN  LOW DOSE) 81 MG tablet Take 1 tablet (81 mg total) by mouth daily, swallow whole 05/07/23  Yes Catheryn Cluck, MD  albuterol  (VENTOLIN  HFA) 108 (90 Base) MCG/ACT inhaler Inhale 1-2 puffs into the lungs every 6 (six) hours as needed for wheezing or shortness of breath. 03/14/23   Raejean Bullock, NP  albuterol  (VENTOLIN  HFA) 108 (90 Base) MCG/ACT inhaler Inhale 2-4 puffs into the lungs every 3 (three) hours as needed for wheezing or shortness of breath.    [provider]  amLODipine -valsartan  (EXFORGE ) 10-320 MG tablet Take 1 tablet by mouth daily. 04/19/23   Goodrich, Callie E, PA-C  Aspirin -Salicylamide-Caffeine (BC HEADACHE POWDER PO) Take 1 packet by mouth every 8 (eight) hours as needed (for headaches).    [provider]  atorvastatin  (LIPITOR) 80 MG tablet Take 1 tablet (80 mg total) by mouth daily. 07/18/23   Clearnce Curia, NP  azithromycin  (ZITHROMAX ) 250 MG tablet Take 1 tablet (250 mg total) by mouth daily.  Take first 2 tablets together, then 1 every day until finished. 06/08/23   Starlene Eaton, FNP  busPIRone  (BUSPAR ) 7.5 MG tablet Take 1 tablet (7.5 mg total) by mouth in the morning and at bedtime. 06/26/23 09/24/23  Catheryn Cluck, MD  ciclopirox  (PENLAC ) 8 % solution Apply topically at bedtime. Apply over nail and surrounding skin. Apply daily over previous coat. After seven (7) days, may remove with alcohol and continue cycle. 07/03/23   Charity Conch, DPM  dolutegravir -lamiVUDine  (DOVATO ) 50-300 MG tablet Take 1 tablet by mouth daily. 07/23/23   Charolette Copier, MD  Fluticasone -Umeclidin-Vilant (TRELEGY ELLIPTA ) 200-62.5-25 MCG/ACT AEPB Inhale 1 puff into the lungs daily. 09/08/22     Fluticasone -Umeclidin-Vilant (TRELEGY ELLIPTA ) 200-62.5-25 MCG/ACT AEPB Inhale 1 puff into the lungs daily at 2 PM. 06/06/23   Raejean Bullock, NP  hydrALAZINE  (  APRESOLINE ) 25 MG tablet Take 1 tablet (25 mg total) by mouth 2 (two) times daily. 06/29/23 09/27/23  Clearnce Curia, NP  ipratropium-albuterol  (DUONEB) 0.5-2.5 (3) MG/3ML SOLN Take 3 mLs by nebulization every 6 (six) hours as needed. Patient taking differently: Take 3 mLs by nebulization every 6 (six) hours as needed (for shortness of breath or wheezing). 02/27/23   Rosa College D, MD  ipratropium-albuterol  (DUONEB) 0.5-2.5 (3) MG/3ML SOLN Take 3 mLs by nebulization every 4 (four) hours as needed. 06/08/23   Starlene Eaton, FNP  montelukast  (SINGULAIR ) 10 MG tablet Take 1 tablet (10 mg total) by mouth at bedtime. 07/18/23   Cobb, Mariah Shines, NP  nebivolol  (BYSTOLIC ) 5 MG tablet Take 1 tablet (5 mg total) by mouth daily. 07/18/23   Clearnce Curia, NP  pregabalin  (LYRICA ) 50 MG capsule Take 1 capsule (50 mg total) by mouth at bedtime as needed. 07/03/23   Charity Conch, DPM  Vitamin D , Ergocalciferol , (DRISDOL ) 1.25 MG (50000 UNIT) CAPS capsule Take one capsule (50,000 Units total) by mouth once a week for 12 weeks, then recheck level in  the office 06/26/23       Physical Exam: Vitals:   08/01/23 1652 08/01/23 1655 08/01/23 1658 08/01/23 1700  BP: (!) 208/91 (!) 197/98 (!) 169/100 (!) 185/105  Pulse: (!) 54 (!) 54 (!) 59 74  Resp: (!) 22 12 12 14   Temp:      TempSrc:      SpO2: 100% 100% 100% 100%  Weight:      Height:        Constitutional: NAD, calm, comfortable Vitals:   08/01/23 1652 08/01/23 1655 08/01/23 1658 08/01/23 1700  BP: (!) 208/91 (!) 197/98 (!) 169/100 (!) 185/105  Pulse: (!) 54 (!) 54 (!) 59 74  Resp: (!) 22 12 12 14   Temp:      TempSrc:      SpO2: 100% 100% 100% 100%  Weight:      Height:       General:  Pleasantly resting in bed, No acute distress. HEENT:  Normocephalic atraumatic.  Sclerae nonicteric, noninjected.  Extraocular movements intact bilaterally. Neck:  Without mass or deformity.  Trachea is midline. Lungs:  Clear to auscultate bilaterally without rhonchi, wheeze, or rales. Heart:  Regular rate and rhythm.  Without murmurs, rubs, or gallops. Abdomen:  Soft, nontender, nondistended.  Without guarding or rebound. Extremities: Without cyanosis, clubbing, edema, or obvious deformity. Vascular:  Dorsalis pedis and posterior tibial pulses palpable bilaterally. Skin:  Warm and dry, no erythema, no ulcerations.  Labs on Admission: I have personally reviewed following labs and imaging studies  CBC: Recent Labs  Lab 08/01/23 1319 08/01/23 1330  WBC 7.4  --   NEUTROABS 4.8  --   HGB 12.6* 12.9*  HCT 37.3* 38.0*  MCV 95.9  --   PLT 267  --    Basic Metabolic Panel: Recent Labs  Lab 08/01/23 1319 08/01/23 1330  NA 141 143  K 4.7 4.9  CL 115* 115*  CO2 20*  --   GLUCOSE 98 93  BUN 19 24*  CREATININE 2.48* 2.50*  CALCIUM  9.3  --    GFR: Estimated Creatinine Clearance: 30.2 mL/min (A) (by C-G formula based on SCr of 2.5 mg/dL (H)). Liver Function Tests: Recent Labs  Lab 08/01/23 1319  AST 21  ALT 16  ALKPHOS 65  BILITOT 0.9  PROT 7.5  ALBUMIN  3.7   Coagulation  Profile: Recent Labs  Lab 08/01/23 1319  INR 1.1   CBG: Recent Labs  Lab 08/01/23 1352  GLUCAP 86   Urine analysis:    Component Value Date/Time   COLORURINE YELLOW 05/15/2023 1329   APPEARANCEUR CLEAR 05/15/2023 1329   LABSPEC 1.015 05/15/2023 1329   PHURINE 5.0 05/15/2023 1329   GLUCOSEU NEGATIVE 05/15/2023 1329   HGBUR NEGATIVE 05/15/2023 1329   BILIRUBINUR NEGATIVE 05/15/2023 1329   KETONESUR NEGATIVE 05/15/2023 1329   PROTEINUR 100 (A) 05/15/2023 1329   UROBILINOGEN 0.2 01/17/2013 0022   NITRITE NEGATIVE 05/15/2023 1329   LEUKOCYTESUR NEGATIVE 05/15/2023 1329    Radiological Exams on Admission: MR CERVICAL SPINE WO CONTRAST Result Date: 08/01/2023 CLINICAL DATA:  weakness EXAM: MRI CERVICAL SPINE WITHOUT CONTRAST TECHNIQUE: Multiplanar, multisequence MR imaging of the cervical spine was performed. No intravenous contrast was administered. COMPARISON:  None Available. FINDINGS: Alignment: Anatomic. Vertebrae: Normal in height and signal intensity. No osseous lesions are present. Cord: Normal in morphology and signal intensity. Posterior Fossa, vertebral arteries, paraspinal tissues: Unremarkable. Disc levels: C2-3: Normal. C3-4: Right-sided uncovertebral joint hypertrophy resulting in moderate right neural foraminal stenosis. There is mild central spinal canal stenosis. The left neural foramen is patent. C4-5: Normal. C5-6: Degenerative disc bulging and mild uncovertebral joint hypertrophy, more pronounced on the left. Mild central spinal canal stenosis and mild-to-moderate left neural foraminal stenosis. C6-7: Mild right paracentral disc bulging with mild central spinal canal stenosis. The neural foramina are patent. C7-T1: Normal. IMPRESSION: 1. Multilevel degenerative disc disease resulting in varying spinal canal and neural foraminal stenosis, but no apparent spinal cord or nerve root impingement. Electronically Signed   By: Maribeth Shivers M.D.   On: 08/01/2023 16:37   MR  BRAIN WO CONTRAST Result Date: 08/01/2023 CLINICAL DATA:  Stroke, follow up eval for mets EXAM: MRI HEAD WITHOUT CONTRAST TECHNIQUE: Multiplanar, multiecho pulse sequences of the brain and surrounding structures were obtained without intravenous contrast. COMPARISON:  CT of the head dated August 01, 2023 and MRI of the brain dated October 25, 2022. FINDINGS: Brain: Mild generalized cerebral volume loss. Extensive periventricular and deep cerebral white matter disease. No evidence of restricted diffusion to indicate acute or recent infarction. No findings suspicious for metastatic disease, although the study is significantly limited by the absence of intravenous contrast. Vascular: Normal vascular flow voids. Skull and upper cervical spine: Normal marrow signal. No osseous lesion is evident. Sinuses/Orbits: Normal orbits.  Minimal paranasal sinus disease. Other: None. IMPRESSION: 1. Age-related atrophy and moderately advanced diffuse cerebral white matter disease. 2. Technically limited study for the assessment of metastatic disease. There are no suspicious findings present. Electronically Signed   By: Maribeth Shivers M.D.   On: 08/01/2023 16:31   CT ANGIO HEAD NECK W WO CM (CODE STROKE) Result Date: 08/01/2023 CLINICAL DATA:  Acute neurological deficit.  Stroke suspected. EXAM: CT ANGIOGRAPHY HEAD AND NECK WITH AND WITHOUT CONTRAST TECHNIQUE: Multidetector CT imaging of the head and neck was performed using the standard protocol during bolus administration of intravenous contrast. Multiplanar CT image reconstructions and MIPs were obtained to evaluate the vascular anatomy. Carotid stenosis measurements (when applicable) are obtained utilizing NASCET criteria, using the distal internal carotid diameter as the denominator. RADIATION DOSE REDUCTION: This exam was performed according to the departmental dose-optimization program which includes automated exposure control, adjustment of the mA and/or kV according to  patient size and/or use of iterative reconstruction technique. CONTRAST:  60mL OMNIPAQUE  IOHEXOL  350 MG/ML SOLN COMPARISON:  CT angiogram of the head and neck dated October 25, 2022. FINDINGS:  CT HEAD FINDINGS Brain: Age-related atrophy and mild to moderate cerebral white matter disease. No evidence of hemorrhage, mass, cortical infarct or hydrocephalus. Vascular: No acute process. Skull: Intact and unremarkable. Sinuses/Orbits: Mild mucosal disease.  Normal orbits. Other: None. Review of the MIP images confirms the above findings CTA NECK FINDINGS Aortic arch: Negative to the extent demonstrated. Right carotid system: Mild circumferential soft plaque in the carotid bulb and origin of the internal carotid artery, with no flow limiting stenosis. Tortuous cervical segment of the right internal carotid artery, which is otherwise widely patent. Left carotid system: Normal in caliber. Mild tortuosity of the cervical segment of the left internal carotid artery. Vertebral arteries: Normal in caliber and widely patent. Skeleton: Negative. Other neck: Unremarkable. Upper chest: Mild emphysema.  The visualized lungs are clear. Review of the MIP images confirms the above findings CTA HEAD FINDINGS Anterior circulation: The internal carotid arteries, anterior cerebral arteries and middle cerebral arteries are widely patent and normal in caliber. No evidence of aneurysm or flow-limiting stenosis. Posterior circulation: No significant stenosis, proximal occlusion, aneurysm, or vascular malformation. Venous sinuses: Patent and unremarkable. Anatomic variants: None. Review of the MIP images confirms the above findings IMPRESSION: 1. Mild soft plaque within the right carotid bulb and origin of the right internal carotid artery, with less than 20% luminal stenosis. 2. Normal CT angiogram of the head. These results were called by telephone at the time of interpretation on 08/01/2023 at 1:53 pm to provider DEVON SHAFER , who verbally  acknowledged these results. Electronically Signed   By: Maribeth Shivers M.D.   On: 08/01/2023 14:05   CT HEAD CODE STROKE WO CONTRAST Result Date: 08/01/2023 CLINICAL DATA:  Code stroke. EXAM: CT HEAD WITHOUT CONTRAST TECHNIQUE: Contiguous axial images were obtained from the base of the skull through the vertex without intravenous contrast. RADIATION DOSE REDUCTION: This exam was performed according to the departmental dose-optimization program which includes automated exposure control, adjustment of the mA and/or kV according to patient size and/or use of iterative reconstruction technique. COMPARISON:  CT of the head dated June 05, 2023. FINDINGS: Brain: Mild-to-moderate subcortical white matter disease. No evidence of hemorrhage, mass, acute cortical infarct or hydrocephalus. Vascular: No hyperdense vessel or unexpected calcification. Skull: Intact and unremarkable. Sinuses/Orbits: Negative. Other: None. ASPECTS [facility] Stroke Program Early CT Score) - Ganglionic level infarction (caudate, lentiform nuclei, internal capsule, insula, M1-M3 cortex): 7 - Supraganglionic infarction (M4-M6 cortex): 3 Total score (0-10 with 10 being normal): 10 IMPRESSION: 1. Mild to moderate cerebral white matter disease. No apparent acute process. 2. ASPECTS is 10. 3. These results were called by telephone at the time of interpretation on 08/01/2023 at 1:53 pm to provider Russella Courts , who verbally acknowledged these results. Electronically Signed   By: Maribeth Shivers M.D.   On: 08/01/2023 13:55    Haydee Lipa DO [facility] For contact please use secure messenger on Epic  If 7PM-7AM, please contact night-coverage located on [email or web address]   08/01/2023, 5:05 PM

## 2023-08-01 NOTE — ED Triage Notes (Signed)
 Pt BIB ems from home for weakness and possible cva. Pt LKW at midnight. Community paramedic was on scene for another pt when she was called over to check pt's BP. Medic activated code stroke. Family and pt reported weakness that started when he woke up around 0730 as well as altered gait. Pt is A&Ox4

## 2023-08-01 NOTE — Code Documentation (Signed)
 Stroke Response Nurse Documentation Code Documentation  Kevin [name] is a 68 y.o. male arriving to [facility]  via [facility] on 08/01/2023 with past medical hx of HIV CHF CKD COPD AF, lung cancer. On aspirin  81 mg daily. Code stroke was activated by EMS.   Patient from home where he was LKW at midnight and now complaining of generalized weakness. Pt felt ok when he went to bed last night at midnight. He has felt not right and generally weak since awakening today. [facility] Paramedic checked his BP, which was elevated, and activated a Code Stroke.   Stroke team at the bedside on patient arrival. Labs drawn and patient cleared for CT by EDP. Patient to CT with team. NIHSS 2, see documentation for details and code stroke times. Patient with bilateral leg weakness on and decreased grip strength on exam. The following imaging was completed:  CT Head and CTA. Patient is not a candidate for IV Thrombolytic due to outside of treatment window. . Patient is not a candidate for IR due to LVO negative..   Care Plan: q 2 VS and NIHSS for 12, then q 4.                   NPO until Stroke Swallow Screen.     Bedside handoff with ED RN Renay Carota.    Ahmod Gillespie Livengood  Stroke Response RN

## 2023-08-02 ENCOUNTER — Inpatient Hospital Stay (HOSPITAL_COMMUNITY)

## 2023-08-02 DIAGNOSIS — I161 Hypertensive emergency: Secondary | ICD-10-CM | POA: Diagnosis not present

## 2023-08-02 LAB — BASIC METABOLIC PANEL WITH GFR
Anion gap: 4 — ABNORMAL LOW (ref 5–15)
BUN: 24 mg/dL — ABNORMAL HIGH (ref 8–23)
CO2: 20 mmol/L — ABNORMAL LOW (ref 22–32)
Calcium: 8.4 mg/dL — ABNORMAL LOW (ref 8.9–10.3)
Chloride: 112 mmol/L — ABNORMAL HIGH (ref 98–111)
Creatinine, Ser: 2.72 mg/dL — ABNORMAL HIGH (ref 0.61–1.24)
GFR, Estimated: 25 mL/min — ABNORMAL LOW (ref >60)
Glucose, Bld: 121 mg/dL — ABNORMAL HIGH (ref 70–99)
Potassium: 4.1 mmol/L (ref 3.5–5.1)
Sodium: 136 mmol/L (ref 135–145)

## 2023-08-02 LAB — CBC
HCT: 31.5 % — ABNORMAL LOW (ref 39.0–52.0)
Hemoglobin: 10.7 g/dL — ABNORMAL LOW (ref 13.0–17.0)
MCH: 32 pg (ref 26.0–34.0)
MCHC: 34 g/dL (ref 30.0–36.0)
MCV: 94.3 fL (ref 80.0–100.0)
Platelets: 244 10*3/uL (ref 150–400)
RBC: 3.34 MIL/uL — ABNORMAL LOW (ref 4.22–5.81)
RDW: 14.5 % (ref 11.5–15.5)
WBC: 8.8 10*3/uL (ref 4.0–10.5)
nRBC: 0 % (ref 0.0–0.2)

## 2023-08-02 MED ORDER — HYDRALAZINE HCL 25 MG PO TABS
25.0000 mg | ORAL_TABLET | Freq: Three times a day (TID) | ORAL | Status: DC
  Administered 2023-08-03: 25 mg via ORAL
  Filled 2023-08-02: qty 1

## 2023-08-02 MED ORDER — HEPARIN SODIUM (PORCINE) 5000 UNIT/ML IJ SOLN
5000.0000 [IU] | Freq: Three times a day (TID) | INTRAMUSCULAR | Status: DC
  Administered 2023-08-03: 5000 [IU] via SUBCUTANEOUS
  Filled 2023-08-02: qty 1

## 2023-08-02 NOTE — Progress Notes (Signed)
   08/02/23 2002  BiPAP/CPAP/SIPAP  $ Non-Invasive Ventilator  Non-Invasive Vent Subsequent  BiPAP/CPAP/SIPAP Pt Type Adult  BiPAP/CPAP/SIPAP Resmed  Reason BIPAP/CPAP not in use Non-compliant  BiPAP/CPAP /SiPAP Vitals  Pulse Rate 72  Resp 17  SpO2 95 %  Bilateral Breath Sounds Clear  MEWS Score/Color  MEWS Score 0  MEWS Score Color Marrie Sizer

## 2023-08-02 NOTE — Progress Notes (Signed)
 PROGRESS NOTE  Kevin [name]:[id number] DOB: [birth year] DOA: 08/01/2023 PCP: Catheryn Cluck, MD  HPI/Recap of past 24 hours: Kevin [name] is a 68 y.o. male with medical history significant of hypertension, HIV/AIDS, heart failure, chronic lumbago, COPD, hypertension, sleep apnea, A-fib, renal cell carcinoma 2023, substance abuse with recently diagnosed non-small cell lung cancer.  Patient presents to the hospital with strokelike symptoms including weakness and altered gait from baseline. In ED, patient was found to have profoundly elevated hypertension, emergent imaging did not show any acute intracranial findings consistent with stroke.  Initiated on nitro glycerin drip and hospitalist called for admission.    Today, BP has since improved and patient has been titrated off nitroglycerin  drip.  Denies any focal neurologic deficits, chest pain, abdominal pain, nausea/vomiting, fever/chills.    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)   Hypertensive emergency  CVA ruled out Presumed secondary to ??noncompliance MRI without acute findings MR C-spine with multilevel degeneration with notable stenosis throughout without cord or root impingement CTA head and neck without acute findings/mild right internal carotid plaque with <20% stenosis Echo pending S/p nitro drip, continue amlodipine , Avapro , hydralazine , Bystolic  Monitor closely  AKI on CKD 3B  Baseline creatinine around 2 Ultrasound of kidney showed echogenic appearing kidneys, no collecting system  dilatation Daily BMP  Heart failure, diastolic dysfunction, without acute exacerbation Last echo 2023 EF 60 to 65% without wall motion abnormality, grade 1 diastolic dysfunction Repeat echo pending Monitor closely  Paroxysmal A-fib CHA2DS2/VAS Stroke Risk = 5 (age, TIA, hypertension, HF), previously noted to be around 2 Unclear why patient is not on any anticoagulation Would strongly encourage patient to follow-up with PCP and cardiology to discuss risks versus benefits of anticoagulation Continue low-dose aspirin    HIV/AIDS Compliant with meds, good control Continue meds  COPD without acute exacerbation Continue scheduled nebs, as needed albuterol   Recently diagnosed non-small cell lung cancer History of renal cell carcinoma Recommend close follow-up with primary oncologist Dr. Liam Redhead  Sleep apnea Continue CPAP as appropriate  Goals of care  Medication compliance issues Patient currently lives alone, manages his own medications and ADLs Patient receives blister pack medications directly from pharmacy and attempts to ensure medication compliance Admitting physician discussed with patient and family, to consider moving patient to an assisted living/facility to assist with management of his medications and general health.  Recommend following up with PCP Dr. Hildy Lowers to continue this conversation as a family   History of substance abuse Prior hx, no current use    Estimated body mass index is 20.9 kg/m as calculated from the following:   Height as of this encounter: 6' (1.829 m).   Weight as of this encounter: 69.9 kg.     Code Status: Full  Family Communication: None at bedside  Disposition Plan: Status is: Inpatient Remains inpatient appropriate because: level of care      Consultants: None  Procedures: None  Antimicrobials: None  DVT prophylaxis: Heparin  [place]   Objective: Vitals:   08/02/23 0551 08/02/23 0923 08/02/23 0926 08/02/23 1119  BP:  139/81  (!) 146/86 132/72  Pulse:    64  Resp:    17  Temp:    98.7 F (37.1 C)  TempSrc:    Oral  SpO2:  96%  97%  Weight:      Height:        Intake/Output Summary (Last 24 hours) at 08/02/2023 1518 Last data filed at 08/02/2023 1150 Gross per 24 hour  Intake 660.77 ml  Output 200 ml  Net 460.77 ml   Filed Weights   08/01/23 1300 08/01/23 1346 08/02/23 0037  Weight: 72.4 kg 72.4 kg 69.9 kg    Exam: General: NAD  Cardiovascular: S1, S2 present Respiratory: CTAB Abdomen: Soft, nontender, nondistended, bowel sounds present Musculoskeletal: No bilateral pedal edema noted Skin: Normal Psychiatry: Normal mood  Neurology: No obvious focal neurologic deficits noted    Data Reviewed: CBC: Recent Labs  Lab 08/01/23 1319 08/01/23 1330 08/02/23 0344  WBC 7.4  --  8.8  NEUTROABS 4.8  --   --   HGB 12.6* 12.9* 10.7*  HCT 37.3* 38.0* 31.5*  MCV 95.9  --  94.3  PLT 267  --  244   Basic Metabolic Panel: Recent Labs  Lab 08/01/23 1319 08/01/23 1330 08/02/23 0344  NA 141 143 136  K 4.7 4.9 4.1  CL 115* 115* 112*  CO2 20*  --  20*  GLUCOSE 98 93 121*  BUN 19 24* 24*  CREATININE 2.57*  2.48* 2.50* 2.72*  CALCIUM  9.3  --  8.4*   GFR: Estimated Creatinine Clearance: 26.8 mL/min (A) (by C-G formula based on SCr of 2.72 mg/dL (H)). Liver Function Tests: Recent Labs  Lab 08/01/23 1319  AST 21  ALT 16  ALKPHOS 65  BILITOT 0.9  PROT 7.5  ALBUMIN  3.7   No results for input(s): LIPASE, AMYLASE in the last 168 hours. No results for input(s): AMMONIA in the last 168 hours. Coagulation Profile: Recent Labs  Lab 08/01/23 1319  INR 1.1   Cardiac Enzymes: No results for input(s): CKTOTAL, CKMB, CKMBINDEX, TROPONINI in the last 168 hours. BNP (last 3 results) No results for input(s): PROBNP in the last 8760 hours. HbA1C: No results for input(s): HGBA1C in the last 72 hours. CBG: Recent Labs  Lab 08/01/23 1352  GLUCAP 86   Lipid  Profile: No results for input(s): CHOL, HDL, LDLCALC, TRIG, CHOLHDL, LDLDIRECT in the last 72 hours. Thyroid  Function Tests: No results for input(s): TSH, T4TOTAL, FREET4, T3FREE, THYROIDAB in the last 72 hours. Anemia Panel: No results for input(s): VITAMINB12, FOLATE, FERRITIN, TIBC, IRON, RETICCTPCT in the last 72 hours. Urine analysis:    Component Value Date/Time   COLORURINE YELLOW 05/15/2023 1329   APPEARANCEUR CLEAR 05/15/2023 1329   LABSPEC 1.015 05/15/2023 1329   PHURINE 5.0 05/15/2023 1329   GLUCOSEU NEGATIVE 05/15/2023 1329   HGBUR NEGATIVE 05/15/2023 1329   BILIRUBINUR NEGATIVE 05/15/2023 1329   KETONESUR NEGATIVE 05/15/2023 1329   PROTEINUR 100 (A) 05/15/2023 1329   UROBILINOGEN 0.2 01/17/2013 0022   NITRITE NEGATIVE 05/15/2023 1329   LEUKOCYTESUR NEGATIVE 05/15/2023 1329   Sepsis Labs: @LABRCNTIP (procalcitonin:4,lacticidven:4)  )No results found for this or any previous visit (from the past 240 hours).    Studies: US  RENAL Result Date: 08/02/2023 CLINICAL DATA:  Acute kidney insufficiency EXAM: RENAL / URINARY TRACT ULTRASOUND COMPLETE COMPARISON:  Ultrasound 03/09/2023.  MRI 04/11/2023 FINDINGS: Right Kidney: Renal measurements: 8.5 x 4.1 x 4.5 cm = volume: 81.9 mL. Diffusely echogenic renal parenchyma. No collecting system dilatation  or perinephric fluid. Left Kidney: Renal measurements: 8.4 x 5.3 x 4.8 cm = volume: 111.1 mL. Echogenic renal parenchyma. No collecting system dilatation or perinephric fluid. Lesion seen by prior MRI along the lower pole left kidney is not well seen on this ultrasound. This could be technical. Bladder: Appears normal for degree of bladder distention. Other: None. IMPRESSION: Echogenic appearing kidneys. Please correlate for any renal dysfunction. No collecting system dilatation. Electronically Signed   By: Adrianna Horde M.D.   On: 08/02/2023 09:00   MR CERVICAL SPINE WO CONTRAST Result Date:  08/01/2023 CLINICAL DATA:  weakness EXAM: MRI CERVICAL SPINE WITHOUT CONTRAST TECHNIQUE: Multiplanar, multisequence MR imaging of the cervical spine was performed. No intravenous contrast was administered. COMPARISON:  None Available. FINDINGS: Alignment: Anatomic. Vertebrae: Normal in height and signal intensity. No osseous lesions are present. Cord: Normal in morphology and signal intensity. Posterior Fossa, vertebral arteries, paraspinal tissues: Unremarkable. Disc levels: C2-3: Normal. C3-4: Right-sided uncovertebral joint hypertrophy resulting in moderate right neural foraminal stenosis. There is mild central spinal canal stenosis. The left neural foramen is patent. C4-5: Normal. C5-6: Degenerative disc bulging and mild uncovertebral joint hypertrophy, more pronounced on the left. Mild central spinal canal stenosis and mild-to-moderate left neural foraminal stenosis. C6-7: Mild right paracentral disc bulging with mild central spinal canal stenosis. The neural foramina are patent. C7-T1: Normal. IMPRESSION: 1. Multilevel degenerative disc disease resulting in varying spinal canal and neural foraminal stenosis, but no apparent spinal cord or nerve root impingement. Electronically Signed   By: Maribeth Shivers M.D.   On: 08/01/2023 16:37   MR BRAIN WO CONTRAST Result Date: 08/01/2023 CLINICAL DATA:  Stroke, follow up eval for mets EXAM: MRI HEAD WITHOUT CONTRAST TECHNIQUE: Multiplanar, multiecho pulse sequences of the brain and surrounding structures were obtained without intravenous contrast. COMPARISON:  CT of the head dated August 01, 2023 and MRI of the brain dated October 25, 2022. FINDINGS: Brain: Mild generalized cerebral volume loss. Extensive periventricular and deep cerebral white matter disease. No evidence of restricted diffusion to indicate acute or recent infarction. No findings suspicious for metastatic disease, although the study is significantly limited by the absence of intravenous contrast.  Vascular: Normal vascular flow voids. Skull and upper cervical spine: Normal marrow signal. No osseous lesion is evident. Sinuses/Orbits: Normal orbits.  Minimal paranasal sinus disease. Other: None. IMPRESSION: 1. Age-related atrophy and moderately advanced diffuse cerebral white matter disease. 2. Technically limited study for the assessment of metastatic disease. There are no suspicious findings present. Electronically Signed   By: Maribeth Shivers M.D.   On: 08/01/2023 16:31    Scheduled Meds:  amLODipine   10 mg Oral Daily   And   irbesartan   300 mg Oral Daily   budesonide -glycopyrrolate-formoterol   2 puff Inhalation BID   dolutegravir -lamiVUDine   1 tablet Oral Daily   heparin   5,000 Units Subcutaneous Q8H   hydrALAZINE   25 mg Oral Q8H   nebivolol   5 mg Oral Daily    Continuous Infusions:  nitroGLYCERIN  Stopped (08/02/23 1428)     LOS: 1 day     Veronica Gordon, MD [facility]  If 7PM-7AM, please contact night-coverage [email or web address] 08/02/2023, 3:18 PM

## 2023-08-02 NOTE — Plan of Care (Signed)
  Problem: Education: Goal: Knowledge of General Education information will improve Description: Including pain rating scale, medication(s)/side effects and non-pharmacologic comfort measures Outcome: Progressing   Problem: Clinical Measurements: Goal: Diagnostic test results will improve Outcome: Progressing   Problem: Pain Managment: Goal: General experience of comfort will improve and/or be controlled Outcome: Progressing

## 2023-08-02 NOTE — TOC CM/SW Note (Signed)
 Transition of Care [facility]) - Inpatient Brief Assessment   Patient Details  Name: LARELL BANEY MRN: [id number] Date of Birth: [birth year]  Transition of Care [facility]) CM/SW Contact:    Cosimo Diones, RN Phone Number: 08/02/2023, 12:49 PM   Clinical Narrative: Patient presented for hypertensive emergency. PTA patient states he was independent from home alone. Patient states he has a PCP and takes an [employer] to his appointments. Case Manager discussed medication compliance with the patient and he states he manages his medications-has a blister pack from his pharmacy. Patient states he should have transportation home once stable. No further needs identified at this time.    Transition of Care Asessment: Insurance and Status: Insurance coverage has been reviewed Patient has primary care physician: Yes Home environment has been reviewed: reviewed Prior level of function:: independent Prior/Current Home Services: No current home services Social Drivers of Health Review: SDOH reviewed no interventions necessary Readmission risk has been reviewed: Yes Transition of care needs: no transition of care needs at this time

## 2023-08-03 ENCOUNTER — Inpatient Hospital Stay (HOSPITAL_COMMUNITY)

## 2023-08-03 DIAGNOSIS — R9431 Abnormal electrocardiogram [ECG] [EKG]: Secondary | ICD-10-CM

## 2023-08-03 DIAGNOSIS — I161 Hypertensive emergency: Secondary | ICD-10-CM | POA: Diagnosis not present

## 2023-08-03 LAB — CBC
HCT: 32.8 % — ABNORMAL LOW (ref 39.0–52.0)
Hemoglobin: 11.1 g/dL — ABNORMAL LOW (ref 13.0–17.0)
MCH: 32.2 pg (ref 26.0–34.0)
MCHC: 33.8 g/dL (ref 30.0–36.0)
MCV: 95.1 fL (ref 80.0–100.0)
Platelets: 238 10*3/uL (ref 150–400)
RBC: 3.45 MIL/uL — ABNORMAL LOW (ref 4.22–5.81)
RDW: 14.3 % (ref 11.5–15.5)
WBC: 6.7 10*3/uL (ref 4.0–10.5)
nRBC: 0 % (ref 0.0–0.2)

## 2023-08-03 LAB — BASIC METABOLIC PANEL WITH GFR
Anion gap: 8 (ref 5–15)
BUN: 25 mg/dL — ABNORMAL HIGH (ref 8–23)
CO2: 20 mmol/L — ABNORMAL LOW (ref 22–32)
Calcium: 8.7 mg/dL — ABNORMAL LOW (ref 8.9–10.3)
Chloride: 113 mmol/L — ABNORMAL HIGH (ref 98–111)
Creatinine, Ser: 2.31 mg/dL — ABNORMAL HIGH (ref 0.61–1.24)
GFR, Estimated: 31 mL/min — ABNORMAL LOW (ref >60)
Glucose, Bld: 87 mg/dL (ref 70–99)
Potassium: 4 mmol/L (ref 3.5–5.1)
Sodium: 141 mmol/L (ref 135–145)

## 2023-08-03 LAB — ECHOCARDIOGRAM COMPLETE
Area-P 1/2: 2.24 cm2
Calc EF: 58.5 %
Height: 72 in
S' Lateral: 2.95 cm
Single Plane A2C EF: 57.9 %
Single Plane A4C EF: 60.9 %
Weight: 2465.6 [oz_av]

## 2023-08-03 NOTE — Progress Notes (Signed)
 AVS given and reviewed with pt. Medications discussed. All questions answered to satisfaction. Pt verbalized understanding of information given. Pt walked off the unit with all belongings.

## 2023-08-03 NOTE — TOC Transition Note (Signed)
 Transition of Care [facility]) - Discharge Note   Patient Details  Name: Kevin [name] MRN: [id number] Date of Birth: [birth year]  Transition of Care [facility]) CM/SW Contact:  Ronni Colace, RN Phone Number: 08/03/2023, 1:19 PM   Clinical Narrative:     Patient will DC today., no needs identified TOC signing off  Final next level of care: Home/Self Care Barriers to Discharge: No Barriers Identified   Patient Goals and CMS Choice            Discharge Placement                       Discharge Plan and Services Additional resources added to the After Visit Summary for                                       Social Drivers of Health (SDOH) Interventions SDOH Screenings   Food Insecurity: No Food Insecurity (08/02/2023)  Housing: Low Risk  (08/02/2023)  Transportation Needs: No Transportation Needs (08/02/2023)  Utilities: Not At Risk (08/02/2023)  Alcohol Screen: Low Risk  (10/05/2022)  Depression (PHQ2-9): High Risk (08/18/2022)  Financial Resource Strain: High Risk (10/05/2022)  Physical Activity: Inactive (10/05/2022)  Social Connections: Moderately Isolated (08/02/2023)  Stress: Stress Concern Present (05/04/2022)  Tobacco Use: High Risk (08/01/2023)     Readmission Risk Interventions    12/29/2021    2:42 PM  Readmission Risk Prevention Plan  Transportation Screening Complete  Medication Review (RN Care Manager) Complete  PCP or Specialist appointment within 3-5 days of discharge Complete  HRI or Home Care Consult Complete  SW Recovery Care/Counseling Consult Complete  Palliative Care Screening Not Applicable  Skilled Nursing Facility Not Applicable

## 2023-08-03 NOTE — Progress Notes (Signed)
  Echocardiogram 2D Echocardiogram has been performed.  Royden Corin 08/03/2023, 10:34 AM

## 2023-08-03 NOTE — Discharge Summary (Signed)
 Physician Discharge Summary   Patient: Kevin [name] MRN: [id number] DOB: 06-19-1955  Admit date:     08/01/2023  Discharge date: 08/03/23  Discharge Physician: Veronica Gordon   PCP: Catheryn Cluck, MD   Recommendations at discharge:   Follow-up with PCP in 1 week  Discharge Diagnoses: Principal Problem:   Hypertensive emergency Active Problems:   COPD with chronic bronchitis and emphysema (HCC)   Essential hypertension   CKD stage 3b, GFR 30-44 ml/min (HCC) - baseline SCr 1.8-2.2   Chronic heart failure with preserved ejection fraction (HFpEF) (HCC)   Paroxysmal A-fib (HCC)   Tobacco abuse   Chronic low back pain   HLD (hyperlipidemia)   Left renal mass   Primary non-small cell carcinoma of lower lobe of right lung [facility])    Hospital Course: Kevin [name] is a 68 y.o. male with medical history significant of hypertension, HIV/AIDS, heart failure, chronic lumbago, COPD, hypertension, sleep apnea, A-fib, renal cell carcinoma 2023, substance abuse with recently diagnosed non-small cell lung cancer.  Patient presents to the hospital with strokelike symptoms including weakness and altered gait from baseline. In ED, patient was found to have profoundly elevated hypertension, emergent imaging did not show any acute intracranial findings consistent with stroke.  Initiated on nitro glycerin drip and hospitalist called for admission.    Today, patient denies any new complaints, denies any chest pain, shortness of breath, nausea/vomiting, fever/chills.  BP has since improved on regular home meds.  Advised to follow-up with PCP in 1 week.   Assessment and Plan:  Hypertensive emergency  CVA ruled out Presumed secondary to ??noncompliance MRI without acute findings MR C-spine with multilevel degeneration with notable stenosis throughout without cord or root impingement CTA head and neck without acute findings/mild right internal carotid plaque with <20% stenosis Echo with EF  of 60 to 65%, no regional wall motion abnormality, grade 1 diastolic dysfunction S/p nitro drip, continue home regimen with amlodipine , valsartan , hydralazine , Bystolic  Outpatient PCP follow-up   AKI on CKD 3B  Baseline creatinine around 2 Ultrasound of kidney showed echogenic appearing kidneys, no collecting system dilatation Outpatient PCP follow-up, nephrology follow-up due to CKD stage III   Heart failure, diastolic dysfunction, without acute exacerbation Echo as above Strong BP control advised   Paroxysmal A-fib CHA2DS2/VAS Stroke Risk = 5 (age, TIA, hypertension, HF), previously noted to be around 2 Unclear why patient is not on any anticoagulation Would strongly encourage patient to follow-up with PCP and cardiology to discuss risks versus benefits of anticoagulation Continue low-dose aspirin    HIV/AIDS Compliant with meds, good control Continue meds   COPD without acute exacerbation Continue scheduled nebs, as needed albuterol    Recently diagnosed non-small cell lung cancer History of renal cell carcinoma Recommend close follow-up with oncologist   Sleep apnea Continue CPAP as appropriate   Goals of care  Medication compliance issues Patient currently lives alone, manages his own medications and ADLs Patient receives blister pack medications directly from pharmacy and attempts to ensure medication compliance Admitting physician discussed with patient and family, to consider moving patient to an assisted living/facility to assist with management of his medications and general health.  Recommend following up with PCP Dr. Hildy Lowers to continue this conversation as a family   History of substance abuse Prior hx, no current use     Consultants: None Procedures performed: None Disposition: Home Diet recommendation:  Cardiac diet   DISCHARGE MEDICATION: Allergies as of 08/03/2023       Reactions  Isosorbide  Mononitrate [isosorbide  Nitrate] Shortness Of Breath         Medication List     STOP taking these medications    azithromycin  250 MG tablet Commonly known as: ZITHROMAX    pregabalin  50 MG capsule Commonly known as: Lyrica        TAKE these medications    albuterol  108 (90 Base) MCG/ACT inhaler Commonly known as: Ventolin  HFA Inhale 1-2 puffs into the lungs every 6 (six) hours as needed for wheezing or shortness of breath. What changed:  how much to take when to take this   amLODipine -valsartan  10-320 MG tablet Commonly known as: EXFORGE  Take 1 tablet by mouth daily.   Aspirin  Low Dose 81 MG tablet Generic drug: aspirin  EC Take 1 tablet (81 mg total) by mouth daily, swallow whole   atorvastatin  80 MG tablet Commonly known as: LIPITOR Take 1 tablet (80 mg total) by mouth daily.   baclofen 10 MG tablet Commonly known as: LIORESAL Take 10 mg by mouth 3 (three) times daily.   BC HEADACHE POWDER PO Take 1 packet by mouth every 8 (eight) hours as needed (for headaches).   busPIRone  7.5 MG tablet Commonly known as: BUSPAR  Take 1 tablet (7.5 mg total) by mouth in the morning and at bedtime.   celecoxib 200 MG capsule Commonly known as: CELEBREX Take 200 mg by mouth 2 (two) times daily as needed (for pain).   ciclopirox  8 % solution Commonly known as: PENLAC  Apply topically at bedtime. Apply over nail and surrounding skin. Apply daily over previous coat. After seven (7) days, may remove with alcohol and continue cycle.   Dovato  50-300 MG tablet Generic drug: dolutegravir -lamiVUDine  Take 1 tablet by mouth daily.   hydrALAZINE  25 MG tablet Commonly known as: APRESOLINE  Take 1 tablet (25 mg total) by mouth 2 (two) times daily.   ipratropium-albuterol  0.5-2.5 (3) MG/3ML Soln Commonly known as: DUONEB Take 3 mLs by nebulization every 6 (six) hours as needed. What changed:  reasons to take this Another medication with the same name was removed. Continue taking this medication, and follow the directions you see  here.   montelukast  10 MG tablet Commonly known as: SINGULAIR  Take 1 tablet (10 mg total) by mouth at bedtime.   nebivolol  5 MG tablet Commonly known as: BYSTOLIC  Take 1 tablet (5 mg total) by mouth daily.   Trelegy Ellipta  200-62.5-25 MCG/ACT Aepb Generic drug: Fluticasone -Umeclidin-Vilant Inhale 1 puff into the lungs daily. What changed: Another medication with the same name was removed. Continue taking this medication, and follow the directions you see here.   Vitamin D  (Ergocalciferol ) 1.25 MG (50000 UNIT) Caps capsule Commonly known as: DRISDOL  Take one capsule (50,000 Units total) by mouth once a week for 12 weeks, then recheck level in the office What changed:  how much to take when to take this        Follow-up Information     Catheryn Cluck, MD. Schedule an appointment as soon as possible for a visit in 1 week(s).   Specialty: Family Medicine Contact information: [address] [place] [address] [phone]                Discharge Exam: Cleavon Curls Weights   08/01/23 1300 08/01/23 1346 08/02/23 0037  Weight: 72.4 kg 72.4 kg 69.9 kg   General: NAD  Cardiovascular: S1, S2 present Respiratory: CTAB Abdomen: Soft, nontender, nondistended, bowel sounds present Musculoskeletal: No bilateral pedal edema noted Skin: Normal Psychiatry: Normal mood   Condition at discharge:  stable  The results of significant diagnostics from this hospitalization (including imaging, microbiology, ancillary and laboratory) are listed below for reference.   Imaging Studies: ECHOCARDIOGRAM COMPLETE Result Date: 08/03/2023    ECHOCARDIOGRAM REPORT   Patient Name:   Eldon A Janusz Date of Exam: 08/03/2023 Medical Rec #:  [id number]       Height:       72.0 in Accession #:    [id number]      Weight:       154.1 lb Date of Birth:  07/28/1955       BSA:          1.907 m Patient Age:    67 years        BP:           139/79 mmHg Patient Gender: M               HR:            54 bpm. Exam Location:  Inpatient Procedure: 2D Echo, Cardiac Doppler, Color Doppler and Strain Analysis (Both            Spectral and Color Flow Doppler were utilized during procedure). Indications:    R94.31 Abnormal EKG  History:        Patient has prior history of Echocardiogram examinations, most                 recent 12/27/2021. CAD, Abnormal ECG, COPD, Arrythmias:Atrial                 Fibrillation, Signs/Symptoms:Shortness of Breath, Dyspnea and                 Chest Pain; Risk Factors:Current Smoker, Dyslipidemia and Sleep                 Apnea.  Sonographer:    Raynelle Callow RDCS Referring Phys: [id number] Kaoru Rezendes J Jacqulin Brandenburger IMPRESSIONS  1. Left ventricular ejection fraction, by estimation, is 60 to 65%. The left ventricle has normal function. The left ventricle has no regional wall motion abnormalities. There is moderate left ventricular hypertrophy. Left ventricular diastolic parameters are consistent with Grade I diastolic dysfunction (impaired relaxation). The average left ventricular global longitudinal strain is -19.2 %. The global longitudinal strain is normal.  2. Right ventricular systolic function is normal. The right ventricular size is normal. Tricuspid regurgitation signal is inadequate for assessing PA pressure.  3. Left atrial size was mildly dilated.  4. The mitral valve is normal in structure. No evidence of mitral valve regurgitation. No evidence of mitral stenosis.  5. The aortic valve is tricuspid. There is mild calcification of the aortic valve. Aortic valve regurgitation is not visualized. Aortic valve sclerosis is present, with no evidence of aortic valve stenosis.  6. Aortic dilatation noted. There is mild dilatation of the ascending aorta, measuring 39 mm. FINDINGS  Left Ventricle: Left ventricular ejection fraction, by estimation, is 60 to 65%. The left ventricle has normal function. The left ventricle has no regional wall motion abnormalities. The average left ventricular global  longitudinal strain is -19.2 %. Strain was performed and the global longitudinal strain is normal. The left ventricular internal cavity size was normal in size. There is moderate left ventricular hypertrophy. Left ventricular diastolic parameters are consistent with Grade I diastolic dysfunction (impaired relaxation). Right Ventricle: The right ventricular size is normal. No increase in right ventricular wall thickness. Right ventricular systolic function is normal. Tricuspid regurgitation signal is inadequate for assessing PA pressure.  Left Atrium: Left atrial size was mildly dilated. Right Atrium: Right atrial size was normal in size. Pericardium: There is no evidence of pericardial effusion. Mitral Valve: The mitral valve is normal in structure. No evidence of mitral valve regurgitation. No evidence of mitral valve stenosis. Tricuspid Valve: The tricuspid valve is normal in structure. Tricuspid valve regurgitation is trivial. No evidence of tricuspid stenosis. Aortic Valve: The aortic valve is tricuspid. There is mild calcification of the aortic valve. Aortic valve regurgitation is not visualized. Aortic valve sclerosis is present, with no evidence of aortic valve stenosis. Pulmonic Valve: The pulmonic valve was normal in structure. Pulmonic valve regurgitation is trivial. No evidence of pulmonic stenosis. Aorta: Aortic dilatation noted. There is mild dilatation of the ascending aorta, measuring 39 mm. IAS/Shunts: No atrial level shunt detected by color flow Doppler. Additional Comments: 3D was performed not requiring image post processing on an independent workstation and was indeterminate.  LEFT VENTRICLE PLAX 2D LVIDd:         4.70 cm      Diastology LVIDs:         2.95 cm      LV e' medial:    5.11 cm/s LV PW:         1.50 cm      LV E/e' medial:  11.7 LV IVS:        1.60 cm      LV e' lateral:   5.44 cm/s LVOT diam:     2.40 cm      LV E/e' lateral: 11.0 LV SV:         106 LV SV Index:   56           2D  Longitudinal Strain LVOT Area:     4.52 cm     2D Strain GLS Avg:     -19.2 %  LV Volumes (MOD) LV vol d, MOD A2C: 144.0 ml LV vol d, MOD A4C: 140.0 ml LV vol s, MOD A2C: 60.6 ml LV vol s, MOD A4C: 54.7 ml LV SV MOD A2C:     83.4 ml LV SV MOD A4C:     140.0 ml LV SV MOD BP:      83.5 ml RIGHT VENTRICLE            IVC RV S prime:     9.14 cm/s  IVC diam: 1.80 cm TAPSE (M-mode): 2.3 cm LEFT ATRIUM             Index        RIGHT ATRIUM           Index LA diam:        4.10 cm 2.15 cm/m   RA Area:     12.60 cm LA Vol (A2C):   58.2 ml 30.52 ml/m  RA Volume:   27.80 ml  14.58 ml/m LA Vol (A4C):   40.1 ml 21.03 ml/m LA Biplane Vol: 52.4 ml 27.48 ml/m  AORTIC VALVE LVOT Vmax:   110.00 cm/s LVOT Vmean:  65.700 cm/s LVOT VTI:    0.235 m  AORTA Ao Root diam: 3.90 cm Ao Asc diam:  3.90 cm MITRAL VALVE MV Area (PHT): 2.24 cm    SHUNTS MV Decel Time: 338 msec    Systemic VTI:  0.24 m MV E velocity: 59.80 cm/s  Systemic Diam: 2.40 cm MV A velocity: 88.70 cm/s MV E/A ratio:  0.67 Janelle Mediate MD Electronically signed by Janelle Mediate MD Signature Date/Time: 08/03/2023/11:26:40 AM  Final    US  RENAL Result Date: 08/02/2023 CLINICAL DATA:  Acute kidney insufficiency EXAM: RENAL / URINARY TRACT ULTRASOUND COMPLETE COMPARISON:  Ultrasound 03/09/2023.  MRI 04/11/2023 FINDINGS: Right Kidney: Renal measurements: 8.5 x 4.1 x 4.5 cm = volume: 81.9 mL. Diffusely echogenic renal parenchyma. No collecting system dilatation or perinephric fluid. Left Kidney: Renal measurements: 8.4 x 5.3 x 4.8 cm = volume: 111.1 mL. Echogenic renal parenchyma. No collecting system dilatation or perinephric fluid. Lesion seen by prior MRI along the lower pole left kidney is not well seen on this ultrasound. This could be technical. Bladder: Appears normal for degree of bladder distention. Other: None. IMPRESSION: Echogenic appearing kidneys. Please correlate for any renal dysfunction. No collecting system dilatation. Electronically Signed   By: Adrianna Horde M.D.   On: 08/02/2023 09:00   MR CERVICAL SPINE WO CONTRAST Result Date: 08/01/2023 CLINICAL DATA:  weakness EXAM: MRI CERVICAL SPINE WITHOUT CONTRAST TECHNIQUE: Multiplanar, multisequence MR imaging of the cervical spine was performed. No intravenous contrast was administered. COMPARISON:  None Available. FINDINGS: Alignment: Anatomic. Vertebrae: Normal in height and signal intensity. No osseous lesions are present. Cord: Normal in morphology and signal intensity. Posterior Fossa, vertebral arteries, paraspinal tissues: Unremarkable. Disc levels: C2-3: Normal. C3-4: Right-sided uncovertebral joint hypertrophy resulting in moderate right neural foraminal stenosis. There is mild central spinal canal stenosis. The left neural foramen is patent. C4-5: Normal. C5-6: Degenerative disc bulging and mild uncovertebral joint hypertrophy, more pronounced on the left. Mild central spinal canal stenosis and mild-to-moderate left neural foraminal stenosis. C6-7: Mild right paracentral disc bulging with mild central spinal canal stenosis. The neural foramina are patent. C7-T1: Normal. IMPRESSION: 1. Multilevel degenerative disc disease resulting in varying spinal canal and neural foraminal stenosis, but no apparent spinal cord or nerve root impingement. Electronically Signed   By: Maribeth Shivers M.D.   On: 08/01/2023 16:37   MR BRAIN WO CONTRAST Result Date: 08/01/2023 CLINICAL DATA:  Stroke, follow up eval for mets EXAM: MRI HEAD WITHOUT CONTRAST TECHNIQUE: Multiplanar, multiecho pulse sequences of the brain and surrounding structures were obtained without intravenous contrast. COMPARISON:  CT of the head dated August 01, 2023 and MRI of the brain dated October 25, 2022. FINDINGS: Brain: Mild generalized cerebral volume loss. Extensive periventricular and deep cerebral white matter disease. No evidence of restricted diffusion to indicate acute or recent infarction. No findings suspicious for metastatic disease,  although the study is significantly limited by the absence of intravenous contrast. Vascular: Normal vascular flow voids. Skull and upper cervical spine: Normal marrow signal. No osseous lesion is evident. Sinuses/Orbits: Normal orbits.  Minimal paranasal sinus disease. Other: None. IMPRESSION: 1. Age-related atrophy and moderately advanced diffuse cerebral white matter disease. 2. Technically limited study for the assessment of metastatic disease. There are no suspicious findings present. Electronically Signed   By: Maribeth Shivers M.D.   On: 08/01/2023 16:31   CT ANGIO HEAD NECK W WO CM (CODE STROKE) Result Date: 08/01/2023 CLINICAL DATA:  Acute neurological deficit.  Stroke suspected. EXAM: CT ANGIOGRAPHY HEAD AND NECK WITH AND WITHOUT CONTRAST TECHNIQUE: Multidetector CT imaging of the head and neck was performed using the standard protocol during bolus administration of intravenous contrast. Multiplanar CT image reconstructions and MIPs were obtained to evaluate the vascular anatomy. Carotid stenosis measurements (when applicable) are obtained utilizing NASCET criteria, using the distal internal carotid diameter as the denominator. RADIATION DOSE REDUCTION: This exam was performed according to the departmental dose-optimization program which includes automated exposure control, adjustment  of the mA and/or kV according to patient size and/or use of iterative reconstruction technique. CONTRAST:  60mL OMNIPAQUE  IOHEXOL  350 MG/ML SOLN COMPARISON:  CT angiogram of the head and neck dated October 25, 2022. FINDINGS: CT HEAD FINDINGS Brain: Age-related atrophy and mild to moderate cerebral white matter disease. No evidence of hemorrhage, mass, cortical infarct or hydrocephalus. Vascular: No acute process. Skull: Intact and unremarkable. Sinuses/Orbits: Mild mucosal disease.  Normal orbits. Other: None. Review of the MIP images confirms the above findings CTA NECK FINDINGS Aortic arch: Negative to the extent  demonstrated. Right carotid system: Mild circumferential soft plaque in the carotid bulb and origin of the internal carotid artery, with no flow limiting stenosis. Tortuous cervical segment of the right internal carotid artery, which is otherwise widely patent. Left carotid system: Normal in caliber. Mild tortuosity of the cervical segment of the left internal carotid artery. Vertebral arteries: Normal in caliber and widely patent. Skeleton: Negative. Other neck: Unremarkable. Upper chest: Mild emphysema.  The visualized lungs are clear. Review of the MIP images confirms the above findings CTA HEAD FINDINGS Anterior circulation: The internal carotid arteries, anterior cerebral arteries and middle cerebral arteries are widely patent and normal in caliber. No evidence of aneurysm or flow-limiting stenosis. Posterior circulation: No significant stenosis, proximal occlusion, aneurysm, or vascular malformation. Venous sinuses: Patent and unremarkable. Anatomic variants: None. Review of the MIP images confirms the above findings IMPRESSION: 1. Mild soft plaque within the right carotid bulb and origin of the right internal carotid artery, with less than 20% luminal stenosis. 2. Normal CT angiogram of the head. These results were called by telephone at the time of interpretation on 08/01/2023 at 1:53 pm to provider DEVON SHAFER , who verbally acknowledged these results. Electronically Signed   By: Maribeth Shivers M.D.   On: 08/01/2023 14:05   CT HEAD CODE STROKE WO CONTRAST Result Date: 08/01/2023 CLINICAL DATA:  Code stroke. EXAM: CT HEAD WITHOUT CONTRAST TECHNIQUE: Contiguous axial images were obtained from the base of the skull through the vertex without intravenous contrast. RADIATION DOSE REDUCTION: This exam was performed according to the departmental dose-optimization program which includes automated exposure control, adjustment of the mA and/or kV according to patient size and/or use of iterative reconstruction  technique. COMPARISON:  CT of the head dated June 05, 2023. FINDINGS: Brain: Mild-to-moderate subcortical white matter disease. No evidence of hemorrhage, mass, acute cortical infarct or hydrocephalus. Vascular: No hyperdense vessel or unexpected calcification. Skull: Intact and unremarkable. Sinuses/Orbits: Negative. Other: None. ASPECTS [facility] Stroke Program Early CT Score) - Ganglionic level infarction (caudate, lentiform nuclei, internal capsule, insula, M1-M3 cortex): 7 - Supraganglionic infarction (M4-M6 cortex): 3 Total score (0-10 with 10 being normal): 10 IMPRESSION: 1. Mild to moderate cerebral white matter disease. No apparent acute process. 2. ASPECTS is 10. 3. These results were called by telephone at the time of interpretation on 08/01/2023 at 1:53 pm to provider Russella Courts , who verbally acknowledged these results. Electronically Signed   By: Maribeth Shivers M.D.   On: 08/01/2023 13:55    Microbiology: Results for orders placed or performed during the hospital encounter of 06/11/23  Fungus Culture With Stain     Status: None   Collection Time: 06/11/23  1:37 PM   Specimen: Bronchial Alveolar Lavage; Respiratory  Result Value Ref Range Status   Fungus Stain Final report  Final   Fungus (Mycology) Culture Final report  Final    Comment: (NOTE) Performed At: [facility] [address] [place],  [place] [id number] Pearlean Botts MD WU:[id number]    Fungal Source BRONCHIAL ALVEOLAR LAVAGE  Final    Comment: RLL Performed at [facility], [address], [place], [place] [address]   Culture, BAL-quantitative w Gram Stain     Status: None   Collection Time: 06/11/23  1:37 PM   Specimen: Bronchial Alveolar Lavage; Respiratory  Result Value Ref Range Status   Specimen Description BRONCHIAL ALVEOLAR LAVAGE  Final   Special Requests RLL  Final   Gram Stain NO WBC SEEN NO ORGANISMS SEEN   Final   Culture   Final    NO GROWTH 2 DAYS Performed at [facility]  Lab, [address] [place], [place] [address]    Report Status 06/14/2023 FINAL  Final  Acid Fast Culture with reflexed sensitivities     Status: None   Collection Time: 06/11/23  1:37 PM   Specimen: Bronchial Alveolar Lavage; Respiratory  Result Value Ref Range Status   Acid Fast Culture Negative  Final    Comment: (NOTE) No acid fast bacilli isolated after 6 weeks. Performed At: [facility] [address] [place], [place] [id number] Pearlean Botts MD [id number]    Source of Sample BRONCHIAL ALVEOLAR LAVAGE  Final    Comment: RLL Performed at [facility], [address], [place], [place] [address]   Acid Fast Smear (AFB)     Status: None   Collection Time: 06/11/23  1:37 PM   Specimen: Bronchial Alveolar Lavage; Respiratory  Result Value Ref Range Status   AFB Specimen Processing Concentration  Final   Acid Fast Smear Negative  Final    Comment: (NOTE) Performed At: [facility] [address] [place], [place] [id number] Pearlean Botts MD VZ:[id number]    Source (AFB) BRONCHIAL ALVEOLAR LAVAGE  Final    Comment: RLL Performed at [facility], [address] [place], [place] [address]   Fungus Culture Result     Status: None   Collection Time: 06/11/23  1:37 PM  Result Value Ref Range Status   Result 1 Comment  Final    Comment: (NOTE) KOH/Calcofluor preparation:  no fungus observed. Performed At: [facility] [address] [place], [place] [id number] Pearlean Botts MD ZS:[id number]   Fungal organism reflex     Status: None   Collection Time: 06/11/23  1:37 PM  Result Value Ref Range Status   Fungal result 1 Comment  Final    Comment: (NOTE) No yeast or mold isolated after 4 weeks. Performed At: [facility] [address] [place], [place] [id number] Pearlean Botts MD CW:[id number]     Labs: CBC: Recent Labs  Lab 08/01/23 1319 08/01/23 1330 08/02/23 0344 08/03/23 0344  WBC 7.4  --  8.8 6.7  NEUTROABS 4.8   --   --   --   HGB 12.6* 12.9* 10.7* 11.1*  HCT 37.3* 38.0* 31.5* 32.8*  MCV 95.9  --  94.3 95.1  PLT 267  --  244 238   Basic Metabolic Panel: Recent Labs  Lab 08/01/23 1319 08/01/23 1330 08/02/23 0344 08/03/23 0344  NA 141 143 136 141  K 4.7 4.9 4.1 4.0  CL 115* 115* 112* 113*  CO2 20*  --  20* 20*  GLUCOSE 98 93 121* 87  BUN 19 24* 24* 25*  CREATININE 2.57*  2.48* 2.50* 2.72* 2.31*  CALCIUM  9.3  --  8.4* 8.7*   Liver Function Tests: Recent Labs  Lab 08/01/23 1319  AST 21  ALT 16  ALKPHOS 65  BILITOT 0.9  PROT 7.5  ALBUMIN  3.7   CBG: Recent Labs  Lab 08/01/23 1352  GLUCAP 86    Discharge time spent: greater than 30 minutes.  Signed: Veronica Gordon, MD [facility] 08/03/2023

## 2023-08-03 NOTE — Plan of Care (Signed)
  Problem: Pain Managment: Goal: General experience of comfort will improve and/or be controlled Outcome: Progressing   Problem: Safety: Goal: Ability to remain free from injury will improve Outcome: Progressing   Problem: Skin Integrity: Goal: Risk for impaired skin integrity will decrease Outcome: Progressing

## 2023-08-05 NOTE — Progress Notes (Unsigned)
 Subjective:    Patient ID: Kevin [name], male    DOB: 05-04-1955, 68 y.o.   MRN: [id number]  Dr Asuncion HPI 68 year old patient with a history of tobacco use (40+ pk-yrs) and COPD, renal cell cancer, HIV on therapy, hypertension and atrial fibrillation with diastolic CHF, OSA on CPAP, gout.  He has been seen by Dr. Gladis in our office for his COPD and also pulmonary nodular disease on a CT chest 11/2020.  Repeat CT scan of the chest was done as below as well as subsequent PET scan.  CT scan of the chest 09/06/2022 reviewed by me shows multiple stable right sided solid and subsolid pulmonary nodules that were stable or regressed.  2 nodules had increased in size including a 7 mm solid right upper lobe nodule, 15 mm mixed density right lower lobe nodule.  This prompted PET scan. He has been experiencing episodes of acute dyspnea, has been back and forth to the ED multiple times this year. Was started on Trelegy in June. Can happen w exertion, chores or at rest. Does not wake him from sleep. He coughs through the day, produces mucous in the am. ? Whether there may be a component of panic / anxiety. Albuterol  nebs 3x a day. Unclear triggers. Does have trouble in hot air.   PET scan 09/21/2022 reviewed by me, shows mild hypermetabolism within the mediastinal nodes with SUV max 4.1 of unclear significance.  The right upper lobe peribronchovascular nodules measured at 5 mm.  None of the groundglass nodules had any hypermetabolism.  There was some nonspecific hypermetabolism in the right apex as well as inferior lingula and an area of some minimal airspace disease that was new compared with the CT done 7/24  [name] 11/29/2022 --68 year old smoker with COPD, HIV, renal cell cancer, hypertension, A-fib with diastolic dysfunction, OSA on CPAP.  He is preparing to undergo colonoscopy with [facility] gastro[facility]ology and needs preoperative surgical evaluation.  I saw him in August for an abnormal CT scan of the chest with  small right upper lobe and right lower lobe pulmonary nodules.  These look as they may have evolved on CT but they were negative on PET scan.  We plan for repeat imaging in January 2025 he was in the emergency department 11/18/2022 with acute exacerbation of COPD, received an extended nebulizer treatment, steroids and was sent out on prednisone  and doxycycline .  His maintenance medication has been Trelegy, albuterol  or DuoNeb as needed. He has been off the trelegy for over a month. He is still smoking a few cig a day. He is feeling better.   PFT from 11/24/22 reviewed and shows severe obstruction without a bronchodilator response, FEV1 42% predicted.  Lung volumes are hyperinflated.  Diffusion capacity decreased and corrects to normal range when adjusted for alveolar volume ================================================== HPI- M followed for OSA, complicated by Smoker(2 cigs/day) with COPD (Dr Shelah), HIV, renal cell cancer, hypertension, A-fib with diastolic dysfunction, OSA on CPAP NPSG 05/07/19- AHI 62.9/hr, desat to 92%, BIPAP to 18/14. Frequent Centrals> recommended AutoServoVentilation study. Body weight 180 lbs ASV titration 06/10/19- EPAP min 7, Max 7, PS min5, Max 20, Breath rate AutoBrMode/ [employer] > not using PFT from 11/24/22 reviewed and shows severe obstruction without a bronchodilator response, FEV1 42% predicted.  Lung volumes are hyperinflated.  Diffusion capacity decreased and corrects to normal range when adjusted for alveolar volume =======================================================================================================    04/09/23- 67 yoM light Smoker followed for OSA, Irregular Sleep Schedule, complicated by Smoker(2 cigs/day) with COPD (  Dr Shelah), HIV, Hx R renal cell cancer/ ablation,, New L renal mass, hypertension, A-fib with diastolic dysfunction, OSA on CPAP  Neb Duoneb, Albuterol  hfa, Trelegy 200, Singulair ,  ASV titration 06/10/19- EPAP min 7, Max 7, PS  min5, Max 20, Breath rate AutoBrMode/ [employer]  ASV due to frequent Centrals. We had asked [employer] to refit mask. Lung nodule and COPD are followed by Dr Shelah.                                                                                        Needs  Preop clearance for colonoscopy.  Discussed the use of AI scribe software for clinic ED visit 03/19/23 for COPD exacerbatioal note transcription with the patient, who gave verbal consent to proceed. History of Present Illness   The patient, with a history of COPD, presents after a recent hospital admission for a COPD exacerbation. He attributes the exacerbation to cold weather and denies a precipitating infection. Since the hospitalization, he reports feeling 'really, really great' and has not had significant issues with mucus production or dyspnea. He has been compliant with his Trelegy inhaler.  The patient also reports high blood pressure, which he attributes to recent stressors, including the deaths of two family members. He has not been resting well due to these stressors.  The patient uses a CPAP machine for sleep apnea and has adjusted the airflow to a more comfortable setting. He plans to bring the chip from the machine to the next appointment for review.  The patient also has a history of kidney cancer, which was treated with ablation. He was recently found to have a mass on the opposite kidney and has scheduled an MRI to further evaluate this. He expresses concern about waiting six months to monitor the mass, given his sister's experience with rapidly progressing cancer. He is managed by a nephrologist in [place].  The patient also mentions a 66 year old family member who had a heart attack and is now recovering.     Assessment and Plan:        He is clear from pulmonary standpoint to proceed with colonoscopy.  Recommend it be done at hospital. Close observation in Recovery.    Chronic Obstructive Pulmonary Disease (COPD) Recent (3 weeks ago)  exacerbation requiring hospitalization, currently stable and back at  baseline.. No current oxygen use. Using Trelegy daily with good control. -Continue current regimen of Trelegy daily. -Encouraged to maintain medication adherence leading up to colonoscopy. Agree with plan for this to be done at hospital in case of respiratory distress..  Obstructive Sleep Apnea (OSA) Using CPAP machine with self-adjusted settings. No recent data available. -Encouraged to bring in CPAP data chip for review at next visit.  Hypertension Elevated readings recently, potentially related to recent personal stressors. -Continue current management and monitor blood pressure.  Renal Mass History of right kidney cancer with ablation. New mass identified on left kidney. MRI scheduled. -Encouraged patient to follow up with nephrologist and ensure communication of MRI results.  Colonoscopy Upcoming procedure, previously delayed due to COPD exacerbation. -Approved for colonoscopy, to be performed in hospital setting due to COPD history.  Follow-up in 4  months or sooner if needed.   08/07/23- 67 yoM light Smoker followed for OSA, Irregular Sleep Schedule, complicated by Smoker(2 cigs/day) with COPD (Dr Shelah), HIV, RLL NSCCA/ XRT, Hx R renal cell cancer/ ablation,, New L renal mass, hypertension, A-fib with diastolic dysfunction, OSA on CPAP  Neb Duoneb, Albuterol  hfa, Trelegy 200, Singulair ,  ASV titration 06/10/19- EPAP min 7, Max 7, PS min5, Max 20, Breath rate [employer]Mode/ [employer]  ASV due to frequent Centrals. We had asked [employer] to refit mask. Oncology/ Dr Sherrod has CT chest f/u in September. Recent hosp for hypertensive emergency- suspected noncompliance.  Discussed the use of AI scribe software for clinical note transcription with the patient, who gave verbal consent to proceed.  History of Present Illness   THAMAS APPLEYARD is a 68 year old male with lung cancer and hypertension who presents with sleep  disturbances and breathing issues.  He completed three radiation therapy sessions for lung cancer in the lower right lung about a week and a half ago.  He experiences sleep disturbances with episodes of drifting off and snapping back awake, leading to sleepless nights. He discontinued the use of his ventilator machine at night due to ineffectiveness. He will try again now that we are prescribing temazepam as of this visit.  He uses an albuterol  rescue inhaler and Trelegy as a maintenance inhaler once daily, with approximately seven doses remaining for Trelegy. He was without his albuterol  inhaler until a prescription was called in this morning.     Assessment and Plan:   NSCCA Lung cancer, right lower lobe Completed radiation therapy. Oncology care by Dr. Deatrice, radiation by Dr. Dewey. - Follow-up CT scan in September.  Sleep disturbance Difficulty staying asleep affects NIV ventilator use. Discussed temazepam for sleep aid without next-day grogginess. - Prescribe temazepam to aid sleep and improve ventilator use.   COPD mixed type -inhalers refilled  Hypertension Blood pressure 151/80. Managed by primary doctor Beverley Hummer. - Coordinate hypertension management with primary doctor, Beverley Hummer.     ROS-see HPI   + = positive Constitutional:    weight loss, night sweats, fevers, chills, fatigue, lassitude.   HEENT:    headaches, difficulty swallowing, tooth/dental problems, sore throat,       sneezing, itching, ear ache, nasal congestion, post nasal drip, snoring CV:    chest pain, orthopnea, PND, swelling in lower extremities, anasarca,     dizziness, +palpitations Resp:   +shortness of breath with exertion or at rest.                productive cough,   non-productive cough, coughing up of blood.              change in color of mucus.  wheezing.   Skin:    rash or lesions. GI:  No-   heartburn, indigestion, abdominal pain, nausea, vomiting, diarrhea,                 change  in bowel habits, loss of appetite GU: dysuria, change in color of urine, no urgency or frequency.   flank pain. MS:   joint pain, stiffness, decreased range of motion, back pain. Neuro-     nothing unusual Psych:  change in mood or affect.  depression or +anxiety.   memory loss.  OBJ- Physical Exam     General- Alert, Oriented, Affect-appropriate, Distress- none acute, ++slender Skin- rash-none, lesions- none, excoriation- none Lymphadenopathy- none Head- atraumatic  Eyes- Gross vision intact, PERRLA, conjunctivae and secretions clear            Ears- Hearing, canals-normal            Nose- Clear, no-Septal dev, mucus, polyps, erosion, perforation             Throat- Mallampati II , mucosa clear , drainage- none, tonsils- atrophic Neck- flexible , trachea midline, no stridor, thyroid  nl, carotid no bruit Chest - symmetrical excursion , unlabored           Heart/CV- RRR , no murmur , no gallop  , no rub, nl s1 s2                           - JVD- none , edema- none, stasis changes- none, varices- none           Lung- clear to P&A/ +diminished, wheeze- none, cough- none , dullness-none, rub- none           Chest wall-  Abd-  Br/ Gen/ Rectal- Not done, not indicated Extrem- cyanosis- none, clubbing, none, atrophy- none, strength- nl Neuro- grossly intact to observation

## 2023-08-06 ENCOUNTER — Encounter (HOSPITAL_BASED_OUTPATIENT_CLINIC_OR_DEPARTMENT_OTHER)

## 2023-08-07 ENCOUNTER — Ambulatory Visit (INDEPENDENT_AMBULATORY_CARE_PROVIDER_SITE_OTHER): Payer: 59 | Admitting: Internal Medicine

## 2023-08-07 ENCOUNTER — Encounter (HOSPITAL_BASED_OUTPATIENT_CLINIC_OR_DEPARTMENT_OTHER): Admitting: Cardiovascular Disease

## 2023-08-07 ENCOUNTER — Other Ambulatory Visit (HOSPITAL_COMMUNITY): Payer: Self-pay

## 2023-08-07 ENCOUNTER — Other Ambulatory Visit: Payer: Self-pay

## 2023-08-07 ENCOUNTER — Encounter: Payer: Self-pay | Admitting: Internal Medicine

## 2023-08-07 VITALS — BP 151/80 | HR 66 | Temp 98.2°F | Resp 18 | Ht 72.0 in | Wt 158.6 lb

## 2023-08-07 DIAGNOSIS — M51361 Other intervertebral disc degeneration, lumbar region with lower extremity pain only: Secondary | ICD-10-CM | POA: Diagnosis not present

## 2023-08-07 DIAGNOSIS — C641 Malignant neoplasm of right kidney, except renal pelvis: Secondary | ICD-10-CM | POA: Diagnosis not present

## 2023-08-07 DIAGNOSIS — C3490 Malignant neoplasm of unspecified part of unspecified bronchus or lung: Secondary | ICD-10-CM | POA: Diagnosis not present

## 2023-08-07 DIAGNOSIS — J449 Chronic obstructive pulmonary disease, unspecified: Secondary | ICD-10-CM

## 2023-08-07 DIAGNOSIS — N1832 Chronic kidney disease, stage 3b: Secondary | ICD-10-CM | POA: Diagnosis not present

## 2023-08-07 DIAGNOSIS — G4733 Obstructive sleep apnea (adult) (pediatric): Secondary | ICD-10-CM | POA: Diagnosis not present

## 2023-08-07 DIAGNOSIS — F17210 Nicotine dependence, cigarettes, uncomplicated: Secondary | ICD-10-CM | POA: Diagnosis not present

## 2023-08-07 DIAGNOSIS — Z79899 Other long term (current) drug therapy: Secondary | ICD-10-CM | POA: Diagnosis not present

## 2023-08-07 DIAGNOSIS — I1A0 Resistant hypertension: Secondary | ICD-10-CM | POA: Diagnosis not present

## 2023-08-07 MED ORDER — TRELEGY ELLIPTA 200-62.5-25 MCG/ACT IN AEPB
1.0000 | INHALATION_SPRAY | Freq: Every day | RESPIRATORY_TRACT | 5 refills | Status: DC
  Filled 2023-08-07 – 2023-08-23 (×3): qty 60, 30d supply, fill #0
  Filled 2023-09-20: qty 60, 30d supply, fill #1

## 2023-08-07 MED ORDER — TEMAZEPAM 30 MG PO CAPS
30.0000 mg | ORAL_CAPSULE | Freq: Every evening | ORAL | 5 refills | Status: DC | PRN
  Filled 2023-08-07 (×2): qty 30, 30d supply, fill #0

## 2023-08-07 MED ORDER — ALBUTEROL SULFATE HFA 108 (90 BASE) MCG/ACT IN AERS: 1.0000 | INHALATION_SPRAY | Freq: Four times a day (QID) | RESPIRATORY_TRACT | 3 refills | Status: DC | PRN

## 2023-08-07 NOTE — Patient Instructions (Signed)
 Script sent refilling Trelegy inhaler- I puff then rinse mouth well, one time daily  Script sent to try temazepam 30 mg, 1 at bedtime for sleep. Use your home ventilator machine when you sleep at night.

## 2023-08-07 NOTE — Telephone Encounter (Signed)
 Patient had OV today.  OK to send in Rx per Dr. Neysa.  Sent rx.

## 2023-08-07 NOTE — Telephone Encounter (Signed)
 Patient needs refill on albuterol .  Send in to [facility] on [address] [place], [place]

## 2023-08-08 ENCOUNTER — Encounter (HOSPITAL_BASED_OUTPATIENT_CLINIC_OR_DEPARTMENT_OTHER): Payer: Self-pay

## 2023-08-08 ENCOUNTER — Other Ambulatory Visit: Payer: Self-pay

## 2023-08-09 ENCOUNTER — Encounter (HOSPITAL_BASED_OUTPATIENT_CLINIC_OR_DEPARTMENT_OTHER): Payer: Self-pay | Admitting: Family

## 2023-08-09 ENCOUNTER — Ambulatory Visit (HOSPITAL_BASED_OUTPATIENT_CLINIC_OR_DEPARTMENT_OTHER): Admitting: Family

## 2023-08-09 VITALS — BP 134/89 | HR 70 | Ht 72.0 in | Wt 155.3 lb

## 2023-08-09 DIAGNOSIS — I1A0 Resistant hypertension: Secondary | ICD-10-CM

## 2023-08-09 DIAGNOSIS — G4733 Obstructive sleep apnea (adult) (pediatric): Secondary | ICD-10-CM

## 2023-08-09 DIAGNOSIS — Z79899 Other long term (current) drug therapy: Secondary | ICD-10-CM | POA: Diagnosis not present

## 2023-08-09 DIAGNOSIS — Z720 Tobacco use: Secondary | ICD-10-CM | POA: Diagnosis not present

## 2023-08-09 NOTE — Patient Instructions (Addendum)
 Medication Instructions:  Your physician recommends that you continue on your current medications as directed. Please refer to the Current Medication list given to you today.    Testing/Procedures: SCHEDULE RENAL    Follow-Up: Please follow up in OCTOBER in [facility] with Dr. Raford, Reche Finder, NP or Allean Mink PharmD    Special Instructions:     Pursed Lip Breathing Pursed lip breathing is a technique to relieve the feeling of being short of breath.  Being short of breath can make you tense and anxious. Before you start this breathing exercise, take a minute to relax your shoulders and close your eyes. Then: Start the exercise by closing your mouth. Breathe in through your nose, taking a normal breath. You can do this at your normal rate of breathing. If you feel you are not getting enough air, breathe in while slowly counting to 2 or 3. Pucker (purse) your lips as if you were going to whistle. Gently tighten the muscles of your abdomenor press on your abdomen to help push the air out. Breathe out slowly through your pursed lips. Take at least twice as long to breathe out as it takes you to breathe in. Make sure that you breathe out all of the air, but do not force air out.

## 2023-08-09 NOTE — Progress Notes (Signed)
 Kevin [name] Assessment:    Date:  08/11/2023   ID:  KHALEN STYER, DOB Dec 07, 1955, MRN [id number]  PCP:  [name], MD  Cardiologist:  Vinie JAYSON Maxcy, MD  Nephrologist:  Referring MD: [name], MD   CC: [name]  History of Present Illness:    Kevin [name] is a 68 y.o. male with a hx of HTN, HLD, COPD, OSA on CPAP, HIV, PAD s/p left SFA intervention 07/2016, CKDIIIb, CAD, tobacco use, lung cancer. Here to follow up in the Kevin [name].   Follows with Dr. Maxcy for general cardiology. Previous cardiac workup includes: Echo 12/2021 LVEF 60-65%, moderate LVH, gr1DD, mildly elevated PASP, no significant valvular abnormalities. Carotid duplex 08/2022 mild nonobstructive 1-39% stenosis bilaterally. Myoview  09/26/22 no ischemia, LVEF 43%.   Established with Kevin [name] 10/05/22. 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 It has been difficult to control particularly with compliance. No formal exercise routine, not routinely following low sodium diet.   At initial visit 10/05/22 Amlodipine  and valsartan  were consolidated to Amlodipine -Valsartan  10-320mg  daily. Hdyralazine adjusted to 100mg  BID dosing for better adherence. Carotid duplex 08/2022 bilateral 1-39% stenosis. Was encouraged to resume using noninvasive ventilator at bedtime.   Admitted 8/29-8/30/24 with COPD exacerbation treated with Prednisone  and abx. Hydralazine  was increased to 100mg  TID.   Saw general cardiology team and Lopressor  was transitioned to Nebivolol  5mg  daily.  CT angio head/neck 10/25/22 mild soft plaque in thoracic aorta, no appreciable hemodynamically significant prox subclavian artery stenosis, mild plaque bilateral ICA but no significant stenosis, patent vertebral arteries.   Seen 11/2022 with home BP 160s.  Kevin noninvasive ventilator at night. Med list clarified with [facility] outpatient pharmacy and Hydralazine  100mg   TID, Amlodipine -Valsartan  10-320mg  daily, Nebivolol  5mg  daily delivered.   Admitted 11/7-11/8/24 with COPD exacerbation. Repeat ED visit 01/04/23 with COPD exacerbation and herpes zoster.  At visit 02/08/23 Imdur  60mg  daily added for [name].   Last seen 05/24/23 with BP not at goal in setting of nonadherence. Regimen and importance of medications reviewed. No changes made. Since that time diagnosed with lung cancer and completed SBRT.   Admitted 07/2023 after presenting with weakness, altered gate. Found to have markedly elevated BP presumed due to nonadherence. MRI unremarkable. CTA head/neck with RICA <20% stenosis. Echo LVEF 60-65%, no RWMA, gr1dd. DIscharged on home regimen amlodipine , valsartan , hydralazine , bystolic .  Discussed the use of AI scribe software for clinical note transcription with the patient, who gave verbal consent to proceed.  History of Present Illness GENERAL Kevin is a 68 year old male who presents with shortness of breath.  He experiences shortness of breath throughout the day, both at rest and during physical activity. He uses an albuterol  inhaler approximately four to five times a day and a nebulizer three to four times daily. He uses Trelegy every morning. No wheezing or significant coughing is present. He is intermittently using his NIV at night. He saw Dr. Neysa of pulmonology earlier this week.   No chest pain, pressure, tightness. Not checking BP routinely at home.    Previous antihypertensives: Coreg  - transitioned to cardioselective beta blocker  Past Medical History:  Diagnosis Date   AIDS (acquired immune deficiency syndrome) (HCC) 08/17/2016   Amaurosis fugax 08/18/2022   Chronic diastolic CHF (congestive heart failure), NYHA class 3 (HCC) 01/2016   Chronic lower back pain    CKD (chronic kidney disease), stage III (HCC)    stage 3b  COPD (chronic obstructive pulmonary disease) (HCC)    Dyspnea    Gout    forearms, hands, ankles, feet  (06/05/2016)   Headache    weekly (06/05/2016)   Heart murmur    never has caused any problems per pt   History of herpes zoster virus 01/2023   [name]    Hypertensive crisis 08/15/2017   Lipoma 07/06/2022   Neuromuscular disorder (HCC) 02/05/2023   post herpetic neuralgia    -  Primary   OSA on CPAP    does not use CPAP   PAD (peripheral artery disease) (HCC)    PAF (paroxysmal atrial fibrillation) (HCC) 01/2016   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   Pulmonary nodules 02/2023   Substance abuse (HCC)    in past    Past Surgical History:  Procedure Laterality Date   BRONCHIAL BIOPSY  06/11/2023   Procedure: BRONCHOSCOPY, WITH BIOPSY;  Surgeon: Shelah Lamar RAMAN, MD;  Location: [facility] ENDOSCOPY;  Service: Pulmonary;;   BRONCHIAL BRUSHINGS  06/11/2023   Procedure: BRONCHOSCOPY, WITH BRUSH BIOPSY;  Surgeon: Shelah Lamar RAMAN, MD;  Location: [facility] ENDOSCOPY;  Service: Pulmonary;;   BRONCHIAL NEEDLE ASPIRATION BIOPSY  06/11/2023   Procedure: BRONCHOSCOPY, WITH NEEDLE ASPIRATION BIOPSY;  Surgeon: Shelah Lamar RAMAN, MD;  Location: [facility] ENDOSCOPY;  Service: Pulmonary;;   BRONCHIAL WASHINGS  06/11/2023   Procedure: IRRIGATION, BRONCHUS;  Surgeon: Shelah Lamar RAMAN, MD;  Location: [facility] ENDOSCOPY;  Service: Pulmonary;;   COLONOSCOPY N/A 04/24/2023   Procedure: COLONOSCOPY;  Surgeon: Rosalie Kitchens, MD;  Location: [facility] ENDOSCOPY;  Service: Gastroenterology;  Laterality: N/A;   IR RADIOLOGIST EVAL & MGMT  05/31/2021   IR RADIOLOGIST EVAL & MGMT  07/26/2021   LEFT HEART CATH AND CORONARY ANGIOGRAPHY N/A 10/23/2016   Procedure: LEFT HEART CATH AND CORONARY ANGIOGRAPHY;  Surgeon: Anner Alm ORN, MD;  Location: [facility] INVASIVE CV LAB;  Service: Cardiovascular;  Laterality: N/A;   LOWER EXTREMITY ANGIOGRAPHY N/A 07/17/2016   Procedure: Lower Extremity Angiography;  Surgeon: Court Dorn PARAS, MD;  Location: [facility] INVASIVE CV LAB;  Service: Cardiovascular;  Laterality: N/A;   LOWER EXTREMITY INTERVENTION N/A  06/05/2016   Procedure: Lower Extremity Intervention;  Surgeon: Dorn PARAS Court, MD;  Location: [facility] INVASIVE CV LAB;  Service: Cardiovascular;  Laterality: N/A;   PERIPHERAL VASCULAR ATHERECTOMY Right 07/17/2016   Procedure: Peripheral Vascular Atherectomy;  Surgeon: Court Dorn PARAS, MD;  Location[facility] INVASIVE CV LAB;  Service: Cardiovascular;  Laterality: Right;  SFA   PERIPHERAL VASCULAR INTERVENTION  06/05/2016   Procedure: Peripheral Vascular Intervention;  Surgeon: Dorn PARAS Court, MD;  Location: [facility] INVASIVE CV LAB;  Service: Cardiovascular;;  left SFA   POLYPECTOMY  04/24/2023   Procedure: POLYPECTOMY;  Surgeon: Rosalie Kitchens, MD;  Location: [facility]OSCOPY;  Service: Gastroenterology;;   RADIOLOGY WITH ANESTHESIA Right 06/29/2021   Procedure: RIGHT RENAL CRYOABLATION;  Surgeon: Philip Cornet, MD;  Location: [facility] ORS;  Service: Anesthesiology;  Laterality: Right;   VIDEO BRONCHOSCOPY WITH ENDOBRONCHIAL NAVIGATION Right 06/11/2023   Procedure: VIDEO BRONCHOSCOPY WITH ENDOBRONCHIAL NAVIGATION;  Surgeon: Shelah Lamar RAMAN, MD;  Location: [facility] ENDOSCOPY;  Service: Pulmonary;  Laterality: Right;  WITH FLUORO AND BIOPSY    Current Medications: No outpatient medications have been marked as taking for the 08/09/23 encounter (Office Visit) with [name], NP.     Allergies:   Isosorbide  mononitrate [isosorbide  nitrate]   Social History   Socioeconomic History   Marital status: Significant Other    Spouse name: Not on file  Number of children: Not on file   Years of education: Not on file   Highest education level: Not on file  Occupational History   Not on file  Tobacco Use   Smoking status: Some Days    Current packs/day: 0.10    Average packs/day: 0.1 packs/day for 42.0 years (4.2 ttl pk-yrs)    Types: Cigarettes    Passive exposure: Never   Smokeless tobacco: Never   Tobacco comments:    Has not smoked since May 4th.2025-06/20/2023  Vaping Use   Vaping status: Former   Substances:  Nicotine , Flavoring  Substance and Sexual Activity   Alcohol use: Not Currently    Alcohol/week: 2.0 standard drinks of alcohol    Types: 2 Cans of beer per week    Comment: rare   Drug use: Not Currently    Comment: rare   Sexual activity: Not Currently    Comment: declined condoms  Other Topics Concern   Not on file  Social History Narrative   Right handed   Social Drivers of Health   Financial Resource Strain: High Risk (10/05/2022)   Overall Financial Resource Strain (CARDIA)    Difficulty of Paying Living Expenses: Hard  Food Insecurity: No Food Insecurity (08/02/2023)   Hunger Vital Sign    Worried About Running Out of Food in the Last Year: Never true    Ran Out of Food in the Last Year: Never true  Transportation Needs: No Transportation Needs (08/02/2023)   PRAPARE - Administrator, Civil Service (Medical): No    Lack of Transportation (Non-Medical): No  Physical Activity: Inactive (10/05/2022)   Exercise Vital Sign    Days of Exercise per Week: 0 days    Minutes of Exercise per Session: 0 min  Stress: Stress Concern Present (05/04/2022)   [employer] of Occupational Health - Occupational Stress Questionnaire    Feeling of Stress : To some extent  Social Connections: Moderately Isolated (08/02/2023)   Social Connection and Isolation Panel    Frequency of Communication with Friends and Family: More than three times a week    Frequency of Social Gatherings with Friends and Family: Once a week    Attends Religious Services: Never    Database administrator or Organizations: No    Attends Engineer, structural: Never    Marital Status: Married     Family History: The patient's family history includes Heart attack in his daughter; High blood pressure in his mother; Lupus in his mother; Seizures in his daughter.  ROS:   Please see the history of present illness.    All other systems reviewed and are negative.  EKGs/Labs/Other Studies Reviewed:          Recent Labs: 03/18/2023: B Natriuretic Peptide 39.9 08/01/2023: ALT 16 08/03/2023: BUN 25; Creatinine, Ser 2.31; Hemoglobin 11.1; Platelets 238; Potassium 4.0; Sodium 141   Recent Lipid Panel    Component Value Date/Time   CHOL 190 10/24/2022 1432   TRIG 79 10/24/2022 1432   HDL 79 10/24/2022 1432   CHOLHDL 2.4 10/24/2022 1432   CHOLHDL 3.5 07/06/2022 1145   VLDL 41 (H) 04/04/2019 0458   LDLCALC 97 10/24/2022 1432   LDLCALC 110 (H) 07/06/2022 1145    Physical Exam:   VS:  BP 134/89 (BP Location: Right Arm, Patient Position: Sitting, Cuff Size: Normal)   Pulse 70   Ht 6' (1.829 m)   Wt 155 lb 4.8 oz (70.4 kg)   SpO2 99%  BMI 21.06 kg/m  , BMI Body mass index is 21.06 kg/m.  Vitals:   08/09/23 1424  BP: 134/89  Pulse: 70  Height: 6' (1.829 m)  Weight: 155 lb 4.8 oz (70.4 kg)  SpO2: 99%  BMI (Calculated): 21.06     GENERAL:  Well appearing HEENT: Pupils equal round and reactive, fundi not visualized, oral mucosa unremarkable NECK:  No jugular venous distention, waveform within normal limits, carotid upstroke brisk and symmetric, no bruits, no thyromegaly LYMPHATICS:  No cervical adenopathy LUNGS:  Clear to auscultation bilaterally HEART:  RRR.  PMI not displaced or sustained,S1 and S2 within normal limits, no S3, no S4, no clicks, no rubs, no murmurs ABD:  Flat, positive bowel sounds normal in frequency in pitch, no bruits, no rebound, no guarding, no midline pulsatile mass, no hepatomegaly, no splenomegaly EXT:  2 plus pulses throughout, no edema, no cyanosis no clubbing SKIN:  No rashes no nodules NEURO:  Cranial nerves II through XII grossly intact, motor grossly intact throughout PSYCH:  Cognitively intact, oriented to person place and time   ASSESSMENT/PLAN:    HTN / Medication management - Antihypertensive regimen limited by renal function. Would avoid diuretic or MRA without nephrology input. Continue to utilize [facility] for adherence  packaging and delivery.  Continue Amlodipine -Valsartan  10-320mg  daily, Nebivolol  5mg  daily, Imdur  60mg  daily.  Hesitant to further increase Imdur  due to twice weekly headache. Hesitant to increase Nebivolol  due to bradycardia. Plan for renal duplex  to rule out new RAS. Prior renal duplex 04/2016 with no stenosis however BP remains persistently difficult to control. Renal duplex rescheduled today as he missed during admission.   Discussed to monitor BP at home at least 2 hours after medications and sitting for 5-10 minutes.  OSA - Encouraged to resume using noninvasive ventilator.  Discussed role in [name] management.   COPD / Lung cancer - Follows with PCP and pulmonology. Educated on pursed lip breathing.   CKD - Established with Dr. Fleurette of nephrology.  Careful titration of diuretic and antihypertensive.    CAD / HLD - Stable with no anginal symptoms. Per general cardiology team.  Tobacco use - Smoking cessation encouraged. Recommend utilization of [id number].   PAD - No new claudication. Follows with [facility].   Screening for Secondary [name]:     10/10/2022    9:11 PM  Causes  Renovascular HTN Screened     - Comments 04/2016 renal duplex with no stenosis  Sleep Apnea Screened     - Comments 09/2022 recommended to resume use of nonivasive ventilator  Thyroid  Disease Screened     - Comments 04/2022 normal TSH  Cushing's Syndrome N/A     - Comments non cushingoid appearance    Relevant Labs/Studies:    Latest Ref Rng & Units 08/03/2023    3:44 AM 08/02/2023    3:44 AM 08/01/2023    1:30 PM  Basic Labs  Sodium 135 - 145 mmol/L 141  136  143   Potassium 3.5 - 5.1 mmol/L 4.0  4.1  4.9   Creatinine 0.61 - 1.24 mg/dL 7.68  7.27  7.49        Latest Ref Rng & Units 04/28/2022    3:32 PM 07/14/2021    8:41 AM  Thyroid    TSH 0.40 - 4.50 mIU/L 1.28  0.511                 08/09/2023    3:07 PM  Renovascular   Renal Artery US   Completed Yes      Disposition:    FU  with MD/PharmD/APP in October 2025   Medication Adjustments/Labs and Tests Ordered: Current medicines are reviewed at length with the patient today.  Concerns regarding medicines are outlined above.  Orders Placed This Encounter  Procedures   VAS US  RENAL ARTERY DUPLEX   No orders of the defined types were placed in this encounter.    Signed, Reche GORMAN Finder, NP  08/11/2023 3:33 PM    [facility]

## 2023-08-10 ENCOUNTER — Ambulatory Visit: Admitting: Internal Medicine

## 2023-08-11 ENCOUNTER — Encounter (HOSPITAL_BASED_OUTPATIENT_CLINIC_OR_DEPARTMENT_OTHER): Payer: Self-pay | Admitting: Family

## 2023-08-13 ENCOUNTER — Other Ambulatory Visit: Payer: Self-pay | Admitting: Infectious Disease

## 2023-08-13 ENCOUNTER — Other Ambulatory Visit: Payer: Self-pay

## 2023-08-13 ENCOUNTER — Ambulatory Visit (HOSPITAL_COMMUNITY)
Admission: RE | Admit: 2023-08-13 | Discharge: 2023-08-13 | Disposition: A | Discharge status: Home / Self Care | Source: Ambulatory Visit | Attending: Family | Admitting: Family

## 2023-08-13 DIAGNOSIS — I1A0 Resistant hypertension: Secondary | ICD-10-CM

## 2023-08-13 DIAGNOSIS — B20 Human immunodeficiency virus [HIV] disease: Secondary | ICD-10-CM

## 2023-08-14 ENCOUNTER — Other Ambulatory Visit: Payer: Self-pay

## 2023-08-14 ENCOUNTER — Ambulatory Visit (HOSPITAL_BASED_OUTPATIENT_CLINIC_OR_DEPARTMENT_OTHER): Payer: Self-pay | Admitting: Family

## 2023-08-14 ENCOUNTER — Other Ambulatory Visit: Payer: Self-pay | Admitting: Infectious Disease

## 2023-08-14 DIAGNOSIS — B20 Human immunodeficiency virus [HIV] disease: Secondary | ICD-10-CM

## 2023-08-14 DIAGNOSIS — Z79899 Other long term (current) drug therapy: Secondary | ICD-10-CM | POA: Diagnosis not present

## 2023-08-14 MED ORDER — DOVATO 50-300 MG PO TABS
1.0000 | ORAL_TABLET | Freq: Every day | ORAL | 0 refills | Status: DC
  Filled 2023-08-14: qty 30, 30d supply, fill #0

## 2023-08-15 ENCOUNTER — Other Ambulatory Visit: Payer: Self-pay

## 2023-08-16 ENCOUNTER — Other Ambulatory Visit: Payer: Self-pay

## 2023-08-16 DIAGNOSIS — Z79899 Other long term (current) drug therapy: Secondary | ICD-10-CM | POA: Diagnosis not present

## 2023-08-21 ENCOUNTER — Encounter: Payer: Self-pay | Admitting: Family Medicine

## 2023-08-21 ENCOUNTER — Ambulatory Visit: Admitting: Family Medicine

## 2023-08-21 VITALS — BP 180/100 | HR 62 | Temp 98.2°F | Ht 72.0 in | Wt 154.0 lb

## 2023-08-21 DIAGNOSIS — N1832 Chronic kidney disease, stage 3b: Secondary | ICD-10-CM | POA: Diagnosis not present

## 2023-08-21 DIAGNOSIS — I739 Peripheral vascular disease, unspecified: Secondary | ICD-10-CM

## 2023-08-21 DIAGNOSIS — Z720 Tobacco use: Secondary | ICD-10-CM

## 2023-08-21 DIAGNOSIS — R2990 Unspecified symptoms and signs involving the nervous system: Secondary | ICD-10-CM | POA: Insufficient documentation

## 2023-08-21 DIAGNOSIS — I2583 Coronary atherosclerosis due to lipid rich plaque: Secondary | ICD-10-CM

## 2023-08-21 DIAGNOSIS — B20 Human immunodeficiency virus [HIV] disease: Secondary | ICD-10-CM

## 2023-08-21 DIAGNOSIS — Z09 Encounter for follow-up examination after completed treatment for conditions other than malignant neoplasm: Secondary | ICD-10-CM

## 2023-08-21 DIAGNOSIS — I480 Paroxysmal atrial fibrillation: Secondary | ICD-10-CM

## 2023-08-21 DIAGNOSIS — C641 Malignant neoplasm of right kidney, except renal pelvis: Secondary | ICD-10-CM | POA: Insufficient documentation

## 2023-08-21 DIAGNOSIS — I5042 Chronic combined systolic (congestive) and diastolic (congestive) heart failure: Secondary | ICD-10-CM

## 2023-08-21 DIAGNOSIS — J439 Emphysema, unspecified: Secondary | ICD-10-CM | POA: Diagnosis not present

## 2023-08-21 DIAGNOSIS — J4489 Other specified chronic obstructive pulmonary disease: Secondary | ICD-10-CM | POA: Diagnosis not present

## 2023-08-21 DIAGNOSIS — R5381 Other malaise: Secondary | ICD-10-CM

## 2023-08-21 DIAGNOSIS — F419 Anxiety disorder, unspecified: Secondary | ICD-10-CM

## 2023-08-21 DIAGNOSIS — I2510 Atherosclerotic heart disease of native coronary artery without angina pectoris: Secondary | ICD-10-CM

## 2023-08-21 DIAGNOSIS — I1A0 Resistant hypertension: Secondary | ICD-10-CM | POA: Diagnosis not present

## 2023-08-21 DIAGNOSIS — G4733 Obstructive sleep apnea (adult) (pediatric): Secondary | ICD-10-CM

## 2023-08-21 DIAGNOSIS — I110 Hypertensive heart disease with heart failure: Secondary | ICD-10-CM | POA: Diagnosis not present

## 2023-08-21 DIAGNOSIS — F5101 Primary insomnia: Secondary | ICD-10-CM

## 2023-08-21 DIAGNOSIS — C3431 Malignant neoplasm of lower lobe, right bronchus or lung: Secondary | ICD-10-CM

## 2023-08-21 DIAGNOSIS — R530 Neoplastic (malignant) related fatigue: Secondary | ICD-10-CM | POA: Insufficient documentation

## 2023-08-21 NOTE — Patient Instructions (Signed)
  VISIT SUMMARY: You were seen today for a follow-up visit after your recent hospitalization for stroke-like symptoms. Your symptoms have resolved, and there were no acute findings. We reviewed your multiple chronic conditions, including hypertension, atrial fibrillation, heart failure, coronary artery disease, chronic kidney disease, renal cell carcinoma, non-small cell lung cancer, COPD, HIV, anxiety, insomnia, and hyperlipidemia. Your treatment plans and medications were discussed and adjusted as needed.  YOUR PLAN: -STROKE-LIKE SYMPTOMS: You experienced stroke-like symptoms, including weakness and altered gait, but no acute issues were found, and your symptoms have resolved. If you experience chest pain, shortness of breath, or other cardiac symptoms, please go to the emergency department immediately.  -HYPERTENSION: Your blood pressure was high today at 180/100 mmHg, but your home readings have been better, around 134/89 mmHg. Hypertension means high blood pressure, which can lead to serious health problems if not managed. Continue taking your current medications: amlodipine , valsartan , hydralazine , and nebivolol .  -PAROXYSMAL ATRIAL FIBRILLATION: Paroxysmal atrial fibrillation is an irregular and often rapid heart rate that can lead to blood clots, stroke, heart failure, and other heart-related complications. Continue your follow-up with cardiology for ongoing management.  -HEART FAILURE: Heart failure means your heart doesn't pump blood as well as it should. Continue your follow-up with cardiology for ongoing management.  -CORONARY ARTERY DISEASE: Coronary artery disease is a condition where the blood vessels supplying the heart are narrowed or blocked. You were managed with a nitro drip during your recent hospitalization. Continue your follow-up with cardiology.  -CHRONIC KIDNEY DISEASE STAGE 4: Chronic kidney disease stage 4 means your kidneys are significantly damaged and not working well. A  nodule was found on your left kidney. Continue your follow-up with nephrology to monitor your kidney function and the nodule.  -RENAL CELL CARCINOMA: Renal cell carcinoma is a type of kidney cancer. Continue your follow-up with oncology and nephrology for ongoing management and monitoring.  -NON-SMALL CELL LUNG CANCER: Non-small cell lung cancer is a type of lung cancer. You recently completed radiation therapy and are experiencing some fatigue, which is improving. Continue your follow-up with pulmonology and oncology.  -CHRONIC OBSTRUCTIVE PULMONARY DISEASE (COPD): COPD is a chronic inflammatory lung disease that causes obstructed airflow from the lungs. Your condition is controlled with Trelegy and PRN albuterol . Continue using these medications and follow up with pulmonology.  -HIV: HIV is a virus that attacks the body's immune system. Your condition is well controlled with Dovato . No immediate follow-up with an infectious disease specialist is needed due to your other ongoing conditions.  -ANXIETY: Anxiety is a feeling of w orry or fear that can be intense. Your anxiety is well controlled with buspirone . Continue taking buspirone  7.5 mg three times a day and follow up in 6 months to recheck your mood.  -INSOMNIA: Insomnia is a sleep disorder where you have trouble falling or staying asleep. Your insomnia is managed with temazepam , which is effective. Continue taking temazepam  as prescribed.  -HYPERLIPIDEMIA: Hyperlipidemia means you have high levels of fats (lipids) in your blood, which can increase your risk of heart disease. Continue taking atorvastatin  to manage this condition.  INSTRUCTIONS: Please continue to monitor your blood pressure at home and keep a log of your readings. Follow up with cardiology, nephrology, pulmonology, and oncology as scheduled. If you experience any new or worsening symptoms, seek medical attention immediately.

## 2023-08-21 NOTE — Progress Notes (Signed)
 Assessment & Plan   Assessment/Plan:    Assessment & Plan Stroke-like symptoms Presented with stroke-like symptoms including weakness and altered gait. No evidence of acute intracranial findings. Symptoms resolved upon discharge. - Advise to go to the emergency department if symptoms such as chest pain, shortness of breath, or other cardiac symptoms occur  Hypertension Resistant hypertension with elevated blood pressure today at 180/100 mmHg. Home readings have improved, recently around 134/89 mmHg. Managed with multiple antihypertensive medications. - Continue current antihypertensive medications: amlodipine , valsartan , hydralazine , nebivolol  - Recommend follow up with cardiology and nephrology  Paroxysmal Atrial Fibrillation Paroxysmal atrial fibrillation requiring ongoing management and monitoring. - Continue follow-up with cardiology  Heart Failure Heart failure requiring ongoing management and monitoring. - Continue follow-up with cardiology  Coronary Artery Disease Coronary artery disease managed with nitro drip during recent hospitalization. - Continue follow-up with cardiology  Chronic Kidney Disease Stage 4 CKD stage 4 with recent nodule discovered on left kidney on ultrasound. Requires ongoing management and monitoring. - Continue follow-up with nephrology - Monitor renal function and nodule on left kidney  Renal Cell Carcinoma Recent diagnosis of renal cell carcinoma requiring ongoing management and monitoring. - Continue follow-up with oncology and nephrology  Non-Small Cell Lung Cancer Non-small cell lung cancer, recently completed radiation therapy with some fatigue reported, but improving. - Continue follow-up with pulmonology and oncology  Chronic Obstructive Pulmonary Disease (COPD) COPD controlled with Trelegy and PRN albuterol . - Continue Trelegy and PRN albuterol  - Follow up with pulmonology  Chronic Fatigue Multiple chronic comorbidities including  heart failure, COPD, multiple malignancies with recent radiation therapies, HIV, sleep apnea, anxiety, insomnia, recent hospitalizations contribute to ongoing fatigue. Reports it is improving, but does significantly make it difficult walking distances greater than 200 feet.  Patient requesting handicap placard for parking.  - Handicap placard signed for parking provided for patient.  - Continue to monitor symptoms of fait medicine gue.   HIV HIV with good control on Dovato . Has not seen infectious disease specialist recently due to other comorbidities.  Anxiety Anxiety well controlled with buspirone  7.5 mg TID. - Continue buspirone  7.5 mg TID - Follow up in 6 months to recheck mood  Insomnia Insomnia managed with temazepam , which is effective. - Continue temazepam  for insomnia  Hyperlipidemia Hyperlipidemia managed with atorvastatin . - Continue atorvastatin   Tobacco abuse - Recommend smoking cessation       There are no discontinued medications.  Return in about 6 months (around 02/21/2024) for mood.        Subjective:   Encounter date: 08/21/2023  ALWIN LANIGAN is a 68 y.o. male who has Shortness of breath; Paroxysmal A-fib (HCC); Tobacco abuse; COPD with chronic bronchitis and emphysema (HCC); Blurry vision, bilateral; Hypertensive cardiovascular disease; Essential hypertension; OSA (obstructive sleep apnea); Peripheral arterial disease (HCC); CKD stage 3b, GFR 30-44 ml/min (HCC) - baseline SCr 1.8-2.2; Claudication in peripheral vascular disease (HCC); HIV (human immunodeficiency virus infection) (HCC); Easy bruising; CAD (coronary artery disease); Hypertensive urgency; Callus of foot; Acute respiratory failure with hypoxia (HCC); Right renal mass; Renal lesion; Chronic heart failure with preserved ejection fraction (HFpEF) (HCC); HLD (hyperlipidemia); Elevated troponin; Chronic low back pain; Lipoma; Lung nodule, multiple; Resistant hypertension; Skin nodule; Anxiety; COPD  exacerbation (HCC); COPD with acute exacerbation (HCC); Left renal mass; HZV (herpes zoster virus) post herpetic neuralgia; 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   He  has a past medical history of AIDS (acquired immune deficiency syndrome) (HCC) (08/17/2016), Amaurosis fugax (08/18/2022), Chronic diastolic CHF (congestive heart failure), NYHA class 3 (HCC) (01/2016), Chronic lower back pain, CKD (chronic kidney disease), stage III (HCC), COPD (chronic obstructive pulmonary disease) (HCC), Dyspnea, Gout, Headache, Heart murmur, History of herpes zoster virus (01/2023), Hypertension, Hypertensive crisis (08/15/2017), Lipoma (07/06/2022), Neuromuscular disorder (HCC) (02/05/2023), OSA on CPAP, PAD (peripheral artery disease) (HCC), PAF (paroxysmal atrial fibrillation) (HCC) (01/2016), 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   Discussed the use of AI scribe software for clinical note transcription with the patient, who gave verbal consent to proceed.  History of Present Illness VENANCIO CHENIER is a 69 year old male with multiple chronic comorbidities who presents for hospital follow-up.  He was admitted to [facility] on August 01, 2023, for stroke-like symptoms, including weakness and altered gait, which resolved during his hospital stay. No acute intracranial findings were noted. He was started on a nitro drip due to significant coronary artery disease.  He has a history of chronic obstructive pulmonary disease managed with  Trelegy and PRN albuterol , with no recent exacerbations or symptoms such as wheezing or dyspnea.  He has paroxysmal atrial fibrillation, heart failure, and resistant hypertension. His blood pressure was elevated at 180/100 during the visit, but he reports improvement at home with recent readings around 134/89. He is on amlodipine , valsartan , hydralazine , and nebivolol .  He has chronic kidney disease stage 4 and a recent diagnosis of renal cell carcinoma with a nodule discovered on the left kidney via ultrasound. He follows up with nephrology for these conditions.  He has non-small cell lung cancer and recently completed radiation therapy, which caused some fatigue that is now improving. He follows up with pulmonology and oncology.  He has a history of HIV, well-controlled with Dovato , and has not seen an infectious disease specialist recently due to other ongoing chronic comorbidities.  He has anxiety, managed with buspirone  7.5 mg TID, and insomnia, for which he takes temazepam .  He has hyperlipidemia treated with atorvastatin .  No recent chest pain, shortness of breath, or other cardiac symptoms. No recent COPD-type symptoms such as wheezing or dyspnea. No recurrence Stroke like symptoms including weakness, confusion, difficulty talking or swallowing.        Past Surgical History:  Procedure Laterality Date   BRONCHIAL BIOPSY  06/11/2023   Procedure: BRONCHOSCOPY, WITH BIOPSY;  Surgeon: Shelah Lamar RAMAN, MD;  Location: [facility] ENDOSCOPY;  Service: Pulmonary;;   BRONCHIAL BRUSHINGS  06/11/2023   Procedure: BRONCHOSCOPY, WITH BRUSH BIOPSY;  Surgeon: Shelah Lamar RAMAN, MD;  Location: [facility] ENDOSCOPY;  Service: Pulmonary;;   BRONCHIAL NEEDLE ASPIRATION BIOPSY  06/11/2023   Procedure: BRONCHOSCOPY, WITH NEEDLE ASPIRATION BIOPSY;  Surgeon: Shelah Lamar RAMAN, MD;  Location: [facility] ENDOSCOPY;  Service: Pulmonary;;   BRONCHIAL WASHINGS  06/11/2023   Procedure: IRRIGATION, BRONCHUS;  Surgeon: Shelah Lamar RAMAN, MD;   Location: [facility] ENDOSCOPY;  Service: Pulmonary;;   COLONOSCOPY N/A 04/24/2023   Procedure: COLONOSCOPY;  Surgeon: Rosalie Kitchens, MD;  Location: [facility] ENDOSCOPY;  Service: Gastroenterology;  Laterality: N/A;   IR RADIOLOGIST EVAL & MGMT  05/31/2021   IR RADIOLOGIST EVAL & MGMT  07/26/2021   LEFT HEART CATH AND CORONARY ANGIOGRAPHY N/A  10/23/2016   Procedure: LEFT HEART CATH AND CORONARY ANGIOGRAPHY;  Surgeon: Anner Alm ORN, MD;  Location: [facility] INVASIVE CV LAB;  Service: Cardiovascular;  Laterality: N/A;   LOWER EXTREMITY ANGIOGRAPHY N/A 07/17/2016   Procedure: Lower Extremity Angiography;  Surgeon: Court Dorn PARAS, MD;  Location: [facility] INVASIVE CV LAB;  Service: Cardiovascular;  Laterality: N/A;   LOWER EXTREMITY INTERVENTION N/A 06/05/2016   Procedure: Lower Extremity Intervention;  Surgeon: Dorn PARAS Court, MD;  Location: [facility] INVASIVE CV LAB;  Service: Cardiovascular;  Laterality: N/A;   PERIPHERAL VASCULAR ATHERECTOMY Right 07/17/2016   Procedure: Peripheral Vascular Atherectomy;  Surgeon: Court Dorn PARAS, MD;  Location: [facility] INVASIVE CV LAB;  Service: Cardiovascular;  Laterality: Right;  SFA   PERIPHERAL VASCULAR INTERVENTION  06/05/2016   Procedure: Peripheral Vascular Intervention;  Surgeon: Dorn PARAS Court, MD;  Location: [facility] INVASIVE CV LAB;  Service: Cardiovascular;;  left SFA   POLYPECTOMY  04/24/2023   Procedure: POLYPECTOMY;  Surgeon: Rosalie Kitchens, MD;  Location: [facility] ENDOSCOPY;  Service: Gastroenterology;;   RADIOLOGY WITH ANESTHESIA Right 06/29/2021   Procedure: RIGHT RENAL CRYOABLATION;  Surgeon: Philip Cornet, MD;  Location: [facility] ORS;  Service: Anesthesiology;  Laterality: Right;   VIDEO BRONCHOSCOPY WITH ENDOBRONCHIAL NAVIGATION Right 06/11/2023   Procedure: VIDEO BRONCHOSCOPY WITH ENDOBRONCHIAL NAVIGATION;  Surgeon: Shelah Lamar RAMAN, MD;  Location: [facility] ENDOSCOPY;  Service: Pulmonary;  Laterality: Right;  WITH FLUORO AND BIOPSY    Outpatient Medications Prior to Visit  Medication Sig Dispense Refill    albuterol  (VENTOLIN  HFA) 108 (90 Base) MCG/ACT inhaler Inhale 1-2 puffs into the lungs every 6 (six) hours as needed for wheezing or shortness of breath. (Patient taking differently: Inhale 2-4 puffs into the lungs every 2 (two) hours as needed for wheezing or shortness of breath.) 6.7 g 3   albuterol  (VENTOLIN  HFA) 108 (90 Base) MCG/ACT inhaler Inhale 1-2 puffs into the lungs every 6 (six) hours as needed for wheezing or shortness of breath. 8 g 3   amLODipine -valsartan  (EXFORGE ) 10-320 MG tablet Take 1 tablet by mouth daily. 90 tablet 2   aspirin  EC (ASPIRIN  LOW DOSE) 81 MG tablet Take 1 tablet (81 mg total) by mouth daily, swallow whole 30 tablet 12   Aspirin -Salicylamide-Caffeine (BC HEADACHE POWDER PO) Take 1 packet by mouth every 8 (eight) hours as needed (for headaches).     atorvastatin  (LIPITOR) 80 MG tablet Take 1 tablet (80 mg total) by mouth daily. 90 tablet 3   baclofen (LIORESAL) 10 MG tablet Take 10 mg by mouth 3 (three) times daily.     busPIRone  (BUSPAR ) 7.5 MG tablet Take 1 tablet (7.5 mg total) by mouth in the morning and at bedtime. 180 tablet 0   celecoxib (CELEBREX) 200 MG capsule Take 200 mg by mouth 2 (two) times daily as needed (for pain).     ciclopirox  (PENLAC ) 8 % solution Apply topically at bedtime. Apply over nail and surrounding skin. Apply daily over previous coat. After seven (7) days, may remove with alcohol and continue cycle. 6.6 mL 2   dolutegravir -lamiVUDine  (DOVATO ) 50-300 MG tablet Take 1 tablet by mouth daily. 30 tablet 0   Fluticasone -Umeclidin-Vilant (TRELEGY ELLIPTA ) 200-62.5-25 MCG/ACT AEPB Inhale 1 puff into the lungs daily. 60 each 5   hydrALAZINE  (APRESOLINE ) 25 MG tablet Take 1 tablet (25 mg total) by mouth 2 (two) times daily. 180 tablet 3   ipratropium-albuterol  (DUONEB) 0.5-2.5 (3) MG/3ML SOLN Take 3 mLs by nebulization every 6 (six) hours as needed. (Patient taking differently: Take 3 mLs  by nebulization every 6 (six) hours as needed (for shortness of  breath or wheezing).) 360 mL 12   montelukast  (SINGULAIR ) 10 MG tablet Take 1 tablet (10 mg total) by mouth at bedtime. 30 tablet 5   nebivolol  (BYSTOLIC ) 5 MG tablet Take 1 tablet (5 mg total) by mouth daily. 90 tablet 3   temazepam  (RESTORIL ) 30 MG capsule Take 1 capsule (30 mg total) by mouth at bedtime as needed for sleep. 30 capsule 5   Vitamin D , Ergocalciferol , (DRISDOL ) 1.25 MG (50000 UNIT) CAPS capsule Take one capsule (50,000 Units total) by mouth once a week for 12 weeks, then recheck level in the office (Patient taking differently: Take 50,000 Units by mouth every Monday.) 12 capsule 0   No facility-administered medications prior to visit.    Family History  Problem Relation Age of Onset   High blood pressure Mother    Lupus Mother    Seizures Daughter    Heart attack Daughter     Social History   Socioeconomic History   Marital status: Significant Other    Spouse name: Not on file   Number of children: Not on file   Years of education: Not on file   Highest education level: Not on file  Occupational History   Not on file  Tobacco Use   Smoking status: Some Days    Current packs/day: 0.10    Average packs/day: 0.1 packs/day for 42.0 years (4.2 ttl pk-yrs)    Types: Cigarettes    Passive exposure: Never   Smokeless tobacco: Never   Tobacco comments:    Has not smoked since May 4th.2025-06/20/2023  Vaping Use   Vaping status: Former   Substances: Nicotine , Flavoring  Substance and Sexual Activity   Alcohol use: Not Currently    Alcohol/week: 2.0 standard drinks of alcohol    Types: 2 Cans of beer per week    Comment: rare   Drug use: Not Currently    Comment: rare   Sexual activity: Not Currently    Comment: declined condoms  Other Topics Concern   Not on file  Social History Narrative   Right handed   Social Drivers of Health   Financial Resource Strain: High Risk (10/05/2022)   Overall Financial Resource Strain (CARDIA)    Difficulty of Paying Living  Expenses: Hard  Food Insecurity: No Food Insecurity (08/02/2023)   Hunger Vital Sign    Worried About Running Out of Food in the Last Year: Never true    Ran Out of Food in the Last Year: Never true  Transportation Needs: No Transportation Needs (08/02/2023)   PRAPARE - Administrator, Civil Service (Medical): No    Lack of Transportation (Non-Medical): No  Physical Activity: Inactive (10/05/2022)   Exercise Vital Sign    Days of Exercise per Week: 0 days    Minutes of Exercise per Session: 0 min  Stress: Stress Concern Present (05/04/2022)   [employer] of Occupational Health - Occupational Stress Questionnaire    Feeling of Stress : To some extent  Social Connections: Moderately Isolated (08/02/2023)   Social Connection and Isolation Panel    Frequency of Communication with Friends and Family: More than three times a week    Frequency of Social Gatherings with Friends and Family: Once a week    Attends Religious Services: Never    Database administrator or Organizations: No    Attends Banker Meetings: Never    Marital Status: Married  Intimate Partner Violence: Not At Risk (08/02/2023)   Humiliation, Afraid, Rape, and Kick questionnaire    Fear of Current or Ex-Partner: No    Emotionally Abused: No    Physically Abused: No    Sexually Abused: No                                                                                                  Objective:  Physical Exam: BP (!) 180/100   Pulse 62   Temp 98.2 F (36.8 C)   Ht 6' (1.829 m)   Wt 154 lb (69.9 kg)   SpO2 96%   BMI 20.89 kg/m    Physical Exam VITALS: BP- 180/100 GENERAL: Alert, cooperative, well developed, no acute distress HEENT: Normocephalic, normal oropharynx, moist mucous membranes CHEST: Clear to auscultation bilaterally, No wheezes, rhonchi, or crackles CARDIOVASCULAR: Normal heart rate and rhythm, S1 and S2 normal without murmurs ABDOMEN: Soft, non-tender, non-distended,  without organomegaly, Normal bowel sounds EXTREMITIES: No cyanosis or edema NEUROLOGICAL: Cranial nerves grossly intact, Moves all extremities without gross motor or sensory deficit   Physical Exam  VAS US  RENAL ARTERY DUPLEX Result Date: 08/13/2023 ABDOMINAL VISCERAL Patient Name:  AAHAN MARQUES  Date of Exam:   08/13/2023 Medical Rec #: [id number]        Accession #:    [id number] Date of Birth: [birth year]        Patient Gender: M Patient Age:   43 years Exam Location:  [address] Procedure:      VAS US  RENAL ARTERY DUPLEX Referring Phys: [id number] CAITLIN S WALKER -------------------------------------------------------------------------------- Indications: Hypertension. Patient denies any abdominal pain. High Risk Factors: Hypertension, current smoker. Other Factors: COPD. Comparison Study: In 04/2016, a renal artery duplex performed at [facility]                   Imaging showed no evidence of stenosis in the bilateral renal                   arteries. Performing Technologist: Nanetta Shad RVT  Examination Guidelines: A complete evaluation includes B-mode imaging, spectral Doppler, color Doppler, and power Doppler as needed of all accessible portions of each vessel. Bilateral testing is considered an integral part of a complete examination. Limited examinations for reoccurring indications may be performed as noted.  Duplex Findings: +--------------------+--------+--------+------+----------------------+ Mesenteric          PSV cm/sEDV cm/sPlaque       Comments        +--------------------+--------+--------+------+----------------------+ Aorta Prox             78                 2.2 cm AP x 2.2 cm TRV +--------------------+--------+--------+------+----------------------+ Aorta Distal           54                                        +--------------------+--------+--------+------+----------------------+ Celiac Artery Origin  212                                         +--------------------+--------+--------+------+----------------------+  SMA Proximal          120      17                                +--------------------+--------+--------+------+----------------------+  +------------------+--------+--------+-------+ Right Renal ArteryPSV cm/sEDV cm/sComment +------------------+--------+--------+-------+ Origin               50      14           +------------------+--------+--------+-------+ Proximal             85      20           +------------------+--------+--------+-------+ Mid                  80      22           +------------------+--------+--------+-------+ Distal               44      12           +------------------+--------+--------+-------+ +-----------------+--------+--------+-------+ Left Renal ArteryPSV cm/sEDV cm/sComment +-----------------+--------+--------+-------+ Origin              66      16           +-----------------+--------+--------+-------+ Proximal            77      23           +-----------------+--------+--------+-------+ Mid                135      29           +-----------------+--------+--------+-------+ Distal             109      28           +-----------------+--------+--------+-------+ +------------+--------+--------+----+-----------+--------+--------+----+ Right KidneyPSV cm/sEDV cm/sRI  Left KidneyPSV cm/sEDV cm/sRI   +------------+--------+--------+----+-----------+--------+--------+----+ Upper Pole  19      7       0.66Upper Pole 10      4       0.66 +------------+--------+--------+----+-----------+--------+--------+----+ Mid         19      6       0.        17      5       0.69 +------------+--------+--------+----+-----------+--------+--------+----+ Lower Pole  17      7       0.60Lower Pole 13      5       0.59 +------------+--------+--------+----+-----------+--------+--------+----+ Hilar       67      23      0.66Hilar      73       22      0.70 +------------+--------+--------+----+-----------+--------+--------+----+ +------------------+------+------------------+------+ Right Kidney            Left Kidney              +------------------+------+------------------+------+ RAR                     RAR                      +------------------+------+------------------+------+ RAR (manual)      1.09  RAR (manual)      1.73   +------------------+------+------------------+------+ Cortex            .79 cmCortex  1.0 cm +------------------+------+------------------+------+ Cortex thickness        Corex thickness          +------------------+------+------------------+------+ Kidney length (cm)9.67  Kidney length (cm)10.22  +------------------+------+------------------+------+  Summary: Largest Aortic Diameter: 2.2 cm  Renal:  Right: Normal size right kidney. Normal right Resistive Index.        Normal cortical thickness of right kidney. No evidence of        right renal artery stenosis. RRV flow present. Left:  Normal size of left kidney. Normal left Resistive Index.        Normal cortical thickness of the left kidney. No evidence of        left renal artery stenosis. LRV flow present. Mesenteric: Normal Superior Mesenteric artery findings. 70 to 99% stenosis in the celiac artery, low end range.  Patent IVC.  *See table(s) above for measurements and observations.  Diagnosing physician: Gaile New MD  Electronically signed by Gaile New MD on 08/13/2023 at 3:05:15 PM.    Final    ECHOCARDIOGRAM COMPLETE Result Date: 08/03/2023    ECHOCARDIOGRAM REPORT   Patient Name:   BARD HAUPERT Date of Exam: 08/03/2023 Medical Rec #:  [id number]       Height:       72.0 in Accession #:    [id number]      Weight:       154.1 lb Date of Birth:  1955/04/17       BSA:          1.907 m Patient Age:    67 years        BP:           139/79 mmHg Patient Gender: M               HR:           54 bpm. Exam Location:  Inpatient  Procedure: 2D Echo, Cardiac Doppler, Color Doppler and Strain Analysis (Both            Spectral and Color Flow Doppler were utilized during procedure). Indications:    R94.31 Abnormal EKG  History:        Patient has prior history of Echocardiogram examinations, most                 recent 12/27/2021. CAD, Abnormal ECG, COPD, Arrythmias:Atrial                 Fibrillation, Signs/Symptoms:Shortness of Breath, Dyspnea and                 Chest Pain; Risk Factors:Current Smoker, Dyslipidemia and Sleep                 Apnea.  Sonographer:    Ellouise Mose RDCS Referring Phys: [id number] NKEIRUKA J EZENDUKA IMPRESSIONS  1. Left ventricular ejection fraction, by estimation, is 60 to 65%. The left ventricle has normal function. The left ventricle has no regional wall motion abnormalities. There is moderate left ventricular hypertrophy. Left ventricular diastolic parameters are consistent with Grade I diastolic dysfunction (impaired relaxation). The average left ventricular global longitudinal strain is -19.2 %. The global longitudinal strain is normal.  2. Right ventricular systolic function is normal. The right ventricular size is normal. Tricuspid regurgitation signal is inadequate for assessing PA pressure.  3. Left atrial size was mildly dilated.  4. The mitral valve is normal in structure. No evidence of mitral valve regurgitation. No evidence of mitral stenosis.  5. The aortic valve is  tricuspid. There is mild calcification of the aortic valve. Aortic valve regurgitation is not visualized. Aortic valve sclerosis is present, with no evidence of aortic valve stenosis.  6. Aortic dilatation noted. There is mild dilatation of the ascending aorta, measuring 39 mm. FINDINGS  Left Ventricle: Left ventricular ejection fraction, by estimation, is 60 to 65%. The left ventricle has normal function. The left ventricle has no regional wall motion abnormalities. The average left ventricular global longitudinal strain is -19.2 %. Strain  was performed and the global longitudinal strain is normal. The left ventricular internal cavity size was normal in size. There is moderate left ventricular hypertrophy. Left ventricular diastolic parameters are consistent with Grade I diastolic dysfunction (impaired relaxation). Right Ventricle: The right ventricular size is normal. No increase in right ventricular wall thickness. Right ventricular systolic function is normal. Tricuspid regurgitation signal is inadequate for assessing PA pressure. Left Atrium: Left atrial size was mildly dilated. Right Atrium: Right atrial size was normal in size. Pericardium: There is no evidence of pericardial effusion. Mitral Valve: The mitral valve is normal in structure. No evidence of mitral valve regurgitation. No evidence of mitral valve stenosis. Tricuspid Valve: The tricuspid valve is normal in structure. Tricuspid valve regurgitation is trivial. No evidence of tricuspid stenosis. Aortic Valve: The aortic valve is tricuspid. There is mild calcification of the aortic valve. Aortic valve regurgitation is not visualized. Aortic valve sclerosis is present, with no evidence of aortic valve stenosis. Pulmonic Valve: The pulmonic valve was normal in structure. Pulmonic valve regurgitation is trivial. No evidence of pulmonic stenosis. Aorta: Aortic dilatation noted. There is mild dilatation of the ascending aorta, measuring 39 mm. IAS/Shunts: No atrial level shunt detected by color flow Doppler. Additional Comments: 3D was performed not requiring image post processing on an independent workstation and was indeterminate.  LEFT VENTRICLE PLAX 2D LVIDd:         4.70 cm      Diastology LVIDs:         2.95 cm      LV e' medial:    5.11 cm/s LV PW:         1.50 cm      LV E/e' medial:  11.7 LV IVS:        1.60 cm      LV e' lateral:   5.44 cm/s LVOT diam:     2.40 cm      LV E/e' lateral: 11.0 LV SV:         106 LV SV Index:   56           2D Longitudinal Strain LVOT Area:     4.52 cm      2D Strain GLS Avg:     -19.2 %  LV Volumes (MOD) LV vol d, MOD A2C: 144.0 ml LV vol d, MOD A4C: 140.0 ml LV vol s, MOD A2C: 60.6 ml LV vol s, MOD A4C: 54.7 ml LV SV MOD A2C:     83.4 ml LV SV MOD A4C:     140.0 ml LV SV MOD BP:      83.5 ml RIGHT VENTRICLE            IVC RV S prime:     9.14 cm/s  IVC diam: 1.80 cm TAPSE (M-mode): 2.3 cm LEFT ATRIUM             Index        RIGHT ATRIUM  Index LA diam:        4.10 cm 2.15 cm/m   RA Area:     12.60 cm LA Vol (A2C):   58.2 ml 30.52 ml/m  RA Volume:   27.80 ml  14.58 ml/m LA Vol (A4C):   40.1 ml 21.03 ml/m LA Biplane Vol: 52.4 ml 27.48 ml/m  AORTIC VALVE LVOT Vmax:   110.00 cm/s LVOT Vmean:  65.700 cm/s LVOT VTI:    0.235 m  AORTA Ao Root diam: 3.90 cm Ao Asc diam:  3.90 cm MITRAL VALVE MV Area (PHT): 2.24 cm    SHUNTS MV Decel Time: 338 msec    Systemic VTI:  0.24 m MV E velocity: 59.80 cm/s  Systemic Diam: 2.40 cm MV A velocity: 88.70 cm/s MV E/A ratio:  0.67 Maude Emmer MD Electronically signed by Maude Emmer MD Signature Date/Time: 08/03/2023/11:26:40 AM    Final    US  RENAL Result Date: 08/02/2023 CLINICAL DATA:  Acute kidney insufficiency EXAM: RENAL / URINARY TRACT ULTRASOUND COMPLETE COMPARISON:  Ultrasound 03/09/2023.  MRI 04/11/2023 FINDINGS: Right Kidney: Renal measurements: 8.5 x 4.1 x 4.5 cm = volume: 81.9 mL. Diffusely echogenic renal parenchyma. No collecting system dilatation or perinephric fluid. Left Kidney: Renal measurements: 8.4 x 5.3 x 4.8 cm = volume: 111.1 mL. Echogenic renal parenchyma. No collecting system dilatation or perinephric fluid. Lesion seen by prior MRI along the lower pole left kidney is not well seen on this ultrasound. This could be technical. Bladder: Appears normal for degree of bladder distention. Other: None. IMPRESSION: Echogenic appearing kidneys. Please correlate for any renal dysfunction. No collecting system dilatation. Electronically Signed   By: Ranell Bring M.D.   On: 08/02/2023 09:00   MR  CERVICAL SPINE WO CONTRAST Result Date: 08/01/2023 CLINICAL DATA:  weakness EXAM: MRI CERVICAL SPINE WITHOUT CONTRAST TECHNIQUE: Multiplanar, multisequence MR imaging of the cervical spine was performed. No intravenous contrast was administered. COMPARISON:  None Available. FINDINGS: Alignment: Anatomic. Vertebrae: Normal in height and signal intensity. No osseous lesions are present. Cord: Normal in morphology and signal intensity. Posterior Fossa, vertebral arteries, paraspinal tissues: Unremarkable. Disc levels: C2-3: Normal. C3-4: Right-sided uncovertebral joint hypertrophy resulting in moderate right neural foraminal stenosis. There is mild central spinal canal stenosis. The left neural foramen is patent. C4-5: Normal. C5-6: Degenerative disc bulging and mild uncovertebral joint hypertrophy, more pronounced on the left. Mild central spinal canal stenosis and mild-to-moderate left neural foraminal stenosis. C6-7: Mild right paracentral disc bulging with mild central spinal canal stenosis. The neural foramina are patent. C7-T1: Normal. IMPRESSION: 1. Multilevel degenerative disc disease resulting in varying spinal canal and neural foraminal stenosis, but no apparent spinal cord or nerve root impingement. Electronically Signed   By: Evalene Coho M.D.   On: 08/01/2023 16:37   MR BRAIN WO CONTRAST Result Date: 08/01/2023 CLINICAL DATA:  Stroke, follow up eval for mets EXAM: MRI HEAD WITHOUT CONTRAST TECHNIQUE: Multiplanar, multiecho pulse sequences of the brain and surrounding structures were obtained without intravenous contrast. COMPARISON:  CT of the head dated August 01, 2023 and MRI of the brain dated October 25, 2022. FINDINGS: Brain: Mild generalized cerebral volume loss. Extensive periventricular and deep cerebral white matter disease. No evidence of restricted diffusion to indicate acute or recent infarction. No findings suspicious for metastatic disease, although the study is significantly limited  by the absence of intravenous contrast. Vascular: Normal vascular flow voids. Skull and upper cervical spine: Normal marrow signal. No osseous lesion is evident. Sinuses/Orbits: Normal orbits.  Minimal paranasal sinus disease. Other: None. IMPRESSION: 1. Age-related atrophy and moderately advanced diffuse cerebral white matter disease. 2. Technically limited study for the assessment of metastatic disease. There are no suspicious findings present. Electronically Signed   By: Evalene Coho M.D.   On: 08/01/2023 16:31   CT ANGIO HEAD NECK W WO CM (CODE STROKE) Result Date: 08/01/2023 CLINICAL DATA:  Acute neurological deficit.  Stroke suspected. EXAM: CT ANGIOGRAPHY HEAD AND NECK WITH AND WITHOUT CONTRAST TECHNIQUE: Multidetector CT imaging of the head and neck was performed using the standard protocol during bolus administration of intravenous contrast. Multiplanar CT image reconstructions and MIPs were obtained to evaluate the vascular anatomy. Carotid stenosis measurements (when applicable) are obtained utilizing NASCET criteria, using the distal internal carotid diameter as the denominator. RADIATION DOSE REDUCTION: This exam was performed according to the departmental dose-optimization program which includes automated exposure control, adjustment of the mA and/or kV according to patient size and/or use of iterative reconstruction technique. CONTRAST:  60mL OMNIPAQUE  IOHEXOL  350 MG/ML SOLN COMPARISON:  CT angiogram of the head and neck dated October 25, 2022. FINDINGS: CT HEAD FINDINGS Brain: Age-related atrophy and mild to moderate cerebral white matter disease. No evidence of hemorrhage, mass, cortical infarct or hydrocephalus. Vascular: No acute process. Skull: Intact and unremarkable. Sinuses/Orbits: Mild mucosal disease.  Normal orbits. Other: None. Review of the MIP images confirms the above findings CTA NECK FINDINGS Aortic arch: Negative to the extent demonstrated. Right carotid system: Mild  circumferential soft plaque in the carotid bulb and origin of the internal carotid artery, with no flow limiting stenosis. Tortuous cervical segment of the right internal carotid artery, which is otherwise widely patent. Left carotid system: Normal in caliber. Mild tortuosity of the cervical segment of the left internal carotid artery. Vertebral arteries: Normal in caliber and widely patent. Skeleton: Negative. Other neck: Unremarkable. Upper chest: Mild emphysema.  The visualized lungs are clear. Review of the MIP images confirms the above findings CTA HEAD FINDINGS Anterior circulation: The internal carotid arteries, anterior cerebral arteries and middle cerebral arteries are widely patent and normal in caliber. No evidence of aneurysm or flow-limiting stenosis. Posterior circulation: No significant stenosis, proximal occlusion, aneurysm, or vascular malformation. Venous sinuses: Patent and unremarkable. Anatomic variants: None. Review of the MIP images confirms the above findings IMPRESSION: 1. Mild soft plaque within the right carotid bulb and origin of the right internal carotid artery, with less than 20% luminal stenosis. 2. Normal CT angiogram of the head. These results were called by telephone at the time of interpretation on 08/01/2023 at 1:53 pm to provider DEVON SHAFER , who verbally acknowledged these results. Electronically Signed   By: Evalene Coho M.D.   On: 08/01/2023 14:05   CT HEAD CODE STROKE WO CONTRAST Result Date: 08/01/2023 CLINICAL DATA:  Code stroke. EXAM: CT HEAD WITHOUT CONTRAST TECHNIQUE: Contiguous axial images were obtained from the base of the skull through the vertex without intravenous contrast. RADIATION DOSE REDUCTION: This exam was performed according to the departmental dose-optimization program which includes automated exposure control, adjustment of the mA and/or kV according to patient size and/or use of iterative reconstruction technique. COMPARISON:  CT of the head  dated June 05, 2023. FINDINGS: Brain: Mild-to-moderate subcortical white matter disease. No evidence of hemorrhage, mass, acute cortical infarct or hydrocephalus. Vascular: No hyperdense vessel or unexpected calcification. Skull: Intact and unremarkable. Sinuses/Orbits: Negative. Other: None. ASPECTS [facility] Stroke Program Early CT Score) - Ganglionic level infarction (caudate, lentiform nuclei, internal capsule, insula, M1-M3  cortex): 7 - Supraganglionic infarction (M4-M6 cortex): 3 Total score (0-10 with 10 being normal): 10 IMPRESSION: 1. Mild to moderate cerebral white matter disease. No apparent acute process. 2. ASPECTS is 10. 3. These results were called by telephone at the time of interpretation on 08/01/2023 at 1:53 pm to provider JAYSON PEREYRA , who verbally acknowledged these results. Electronically Signed   By: Evalene Coho M.D.   On: 08/01/2023 13:55   NM PET Image Initial (PI) Skull Base To Thigh Result Date: 07/04/2023 CLINICAL DATA:  Subsequent treatment strategy for lung nodule. History of renal cell carcinoma. EXAM: NUCLEAR MEDICINE PET SKULL BASE TO THIGH TECHNIQUE: 8.09 mCi F-18 FDG was injected intravenously. Full-ring PET imaging was performed from the skull base to thigh after the radiotracer. CT data was obtained and used for attenuation correction and anatomic localization. Fasting blood glucose: 88 mg/dl COMPARISON:  CT 95/88/7974 of the chest. Abdomen pelvis 05/15/2023. Remote PET-CT 09/21/2022. FINDINGS: Mediastinal blood pool activity: SUV max 1.5 Liver activity: SUV max 2.0 NECK: There is some misregistration with motion in the head neck region. Grossly no specific abnormal uptake above blood pool in the lymph node chains in the neck including submandibular, posterior triangle or internal jugular. Incidental CT findings: Nasal sinuses and mastoid air cells are clear. Bilateral middle turbinate concha bullosa. The parotid glands, submandibular glands unremarkable. Slightly  heterogeneous thyroid  gland. CHEST: On the prior PET-CT scan there were areas of uptake along lymph nodes in the mediastinum and hilum. This is no longer present. No abnormal uptake above blood pool in the axillary region, hilum or mediastinum. The the previous area of abnormal uptake at the anterior right lung apex abutting the pleura is no longer identified. Nodular thickening this location is also improved from that examination. On the study of April 2025 chest CT there is a 5 mm right upper lobe lung nodule which is stable today on image 61 of series 4 and going back to the study of 09/21/2022. This is not show any abnormal uptake today but is quite small. On the recent CT scan there also some areas of nodularity identified in the right lower lobe. These have significantly increased in size. The dominant focus which was somewhat heterogeneous in semi-solid measuring 14 mm, today is now a homogeneously solid lesion measuring on image 82 at 2.2 by 1.3 cm. There is an adjacent lateral focus which is also larger now measuring 12 mm. These lesions have uptake approaching maximum SUV of the large lesion of 2.7. No other areas of abnormal uptake along the lung parenchyma today. Incidental CT findings: No pleural effusion or pneumothorax. Breathing motion. Faint nodular focus in the right lung on image 75 is without uptake measuring 6 mm. This was semi-solid/ground-glass on the recent chest CT and was also present on the study of 2024 in retrospect. Some emphysematous lung changes are identified. The heart is enlarged. Normal caliber thoracic aorta with some slight calcified plaque. Gynecomastia is noted. ABDOMEN/PELVIS: Physiologic distribution radiotracer identified along the parenchymal organs, bowel and renal collecting systems. Incidental CT findings: Grossly the liver, spleen, adrenal glands are unremarkable. Pancreas is preserved. Slight wall thickening of the stomach is again seen. Focal atrophy along the  posterior aspect of the right kidney with some heterogeneity. Punctate nonobstructing left-sided renal stone. Normal caliber aorta and IVC with scattered vascular calcifications. Preserved contour to the urinary bladder. Large bowel is normal course and caliber with scattered colonic stool. Normal appendix. Gallbladder is present. Small fat containing umbilical hernia.  No free air or free fluid. SKELETON: No abnormal uptake identified along the visualized osseous structures. Incidental CT findings: Slight curvature of the spine. Trace anterolisthesis of L4 on L5. There is some heterogeneous appearance of the femoral heads bilaterally. Please correlate for any etiology such as AVN. IMPRESSION: Nodular areas identified in the right lower lobe of the lung have significantly increased in size from the study of April 2025 but have low-level uptake. Although a malignancy would be in the differential with the time course of development in the level uptake infectious or inflammatory process is possible. Recommend short follow-up CT imaging in 6-12 weeks. If there is more clinical concern of a malignant lesion additional more aggressive approach could be considered. There are some other smaller areas of nodularity which are stable and do not show abnormal uptake. No additional areas of abnormal radiotracer uptake including along lymph nodes at this time. Posttreatment changes involving the right kidney. Bilateral femoral head AVN. Electronically Signed   By: Ranell Bring M.D.   On: 07/04/2023 11:24   DG Foot Complete Left Result Date: 07/03/2023 Please see detailed radiograph report in office note.  DG Foot Complete Right Result Date: 07/03/2023 Please see detailed radiograph report in office note.  DG Chest Port 1 View Result Date: 06/11/2023 CLINICAL DATA:  [id number] S/P bronchoscopy with biopsy [id number] EXAM: PORTABLE CHEST - 1 VIEW COMPARISON:  June 05, 2023, March 18, 2023 FINDINGS: Low lung volumes. Elevation  the right hemidiaphragm. Hazy airspace opacity in the right lung base no lobar consolidation, pleural effusion, or pneumothorax. The cardiac silhouette is at the upper limits of normal, likely accentuated by AP technique and low lung volumes. No acute fracture or destructive lesion. Multilevel thoracic osteophytosis. IMPRESSION: Low lung volumes. Hazy airspace opacities, possibly atelectasis or a small amount of postprocedural alveolar hemorrhage. No pneumothorax. Electronically Signed   By: Rogelia Myers M.D.   On: 06/11/2023 15:10   DG C-ARM BRONCHOSCOPY Result Date: 06/11/2023 C-ARM BRONCHOSCOPY: Fluoroscopy was utilized by the requesting physician.  No radiographic interpretation.   CT CHEST WO CONTRAST Result Date: 06/06/2023 CLINICAL DATA:  Follow-up lung nodules. History of renal cell carcinoma status post ablation. EXAM: CT CHEST WITHOUT CONTRAST TECHNIQUE: Multidetector CT imaging of the chest was performed following the standard protocol without IV contrast. RADIATION DOSE REDUCTION: This exam was performed according to the departmental dose-optimization program which includes automated exposure control, adjustment of the mA and/or kV according to patient size and/or use of iterative reconstruction technique. COMPARISON:  03/07/2023 FINDINGS: Cardiovascular: Normal heart size. Aortic atherosclerosis. The ascending thoracic aorta measures 4 cm, image 88/6. No pericardial effusion. Coronary artery calcification. Mediastinum/Nodes: Thyroid  gland, trachea, and esophagus appear normal. No enlarged mediastinal or axillary lymph nodes. Lungs/Pleura: The central airways appear patent. There is no pleural fluid, interstitial edema or airspace disease. Emphysema with diffuse bronchial wall thickening. -Index nodules include: Right upper lobe lung nodule measures 5 mm, image 45/5. Stable in the interval. -Part solid nodule within the right lower lobe measures 1.4 cm with internal solid component measuring 5  mm, image 97/5. On the previous exam this measured 1.4 cm with solid component measuring 4 mm. -Unchanged ground-glass attenuating nodule along the proximal minor fissure, image 7/75. Unchanged. Upper Abdomen: No acute abnormality. Musculoskeletal: No chest wall mass or suspicious bone lesions identified. IMPRESSION: 1. No acute findings. 2. Stable appearance of 1.4 cm part solid nodule within the right lower lobe with internal solid component measuring 5 mm. Low-grade pulmonary neoplasm such as  adenocarcinoma is not excluded. 3. Additional lung nodules are unchanged in the interval. 4. Coronary artery calcifications. 5. Aortic Atherosclerosis (ICD10-I70.0) and Emphysema (ICD10-J43.9). Electronically Signed   By: Waddell Calk M.D.   On: 06/06/2023 08:20   CT HEAD WO CONTRAST ( ) Result Date: 06/05/2023 EXAM: CT HEAD WITHOUT 06/05/2023 05:24:56 PM TECHNIQUE: CT of the head was performed without the administration of intravenous contrast. Automated exposure control, iterative reconstruction, and/or weight based adjustment of the mA/kV was utilized to reduce the radiation dose to as low as reasonably achievable. COMPARISON: CT Head dated 10/25/2022. CLINICAL HISTORY: Headache, new onset (Age 80y). C/O - Shortness of Breath; Headache; Hypertension. FINDINGS: BRAIN AND VENTRICLES: There is no acute intracranial hemorrhage, mass effect or midline shift. No abnormal extra-axial fluid collection. The gray-white differentiation is maintained without evidence of an acute infarct. There is mild scattered periventricular and subcortical low density, likely reflecting small vessel ischemic changes. There is no evidence of hydrocephalus. ORBITS: The visualized portion of the orbits demonstrate no acute abnormality. SINUSES: The visualized paranasal sinuses and mastoid air cells demonstrate no acute abnormality. SOFT TISSUES AND SKULL: No acute abnormality of the visualized skull or soft tissues. IMPRESSION: 1. No acute  intracranial abnormality. Electronically signed by: Pinkie Pebbles MD 06/05/2023 05:39 PM EDT RP Workstation: [id number]   DG Chest 2 View Result Date: 06/05/2023 CLINICAL DATA:  Chest pain, shortness of breath. EXAM: CHEST - 2 VIEW COMPARISON:  March 18, 2023. FINDINGS: The heart size and mediastinal contours are within normal limits. Both lungs are clear. The visualized skeletal structures are unremarkable. IMPRESSION: No active cardiopulmonary disease. Electronically Signed   By: Lynwood Landy Raddle M.D.   On: 06/05/2023 15:45    Recent Results (from the past 2160 hours)  CBC with Differential     Status: Abnormal   Collection Time: 06/05/23  3:59 PM  Result Value Ref Range   WBC 7.7 4.0 - 10.5 K/uL   RBC 3.23 (L) 4.22 - 5.81 MIL/uL   Hemoglobin 10.5 (L) 13.0 - 17.0 g/dL   HCT 68.9 (L) 60.9 - 47.9 %   MCV 96.0 80.0 - 100.0 fL   MCH 32.5 26.0 - 34.0 pg   MCHC 33.9 30.0 - 36.0 g/dL   RDW 85.2 88.4 - 84.4 %   Platelets 263 150 - 400 K/uL   nRBC 0.0 0.0 - 0.2 %   Neutrophils Relative % 61 %   Neutro Abs 4.7 1.7 - 7.7 K/uL   Lymphocytes Relative 20 %   Lymphs Abs 1.5 0.7 - 4.0 K/uL   Monocytes Relative 7 %   Monocytes Absolute 0.5 0.1 - 1.0 K/uL   Eosinophils Relative 12 %   Eosinophils Absolute 0.9 (H) 0.0 - 0.5 K/uL   Basophils Relative 0 %   Basophils Absolute 0.0 0.0 - 0.1 K/uL   Immature Granulocytes 0 %   Abs Immature Granulocytes 0.02 0.00 - 0.07 K/uL    Comment: Performed at [facility], [address] [place], [place] [address]  Comprehensive metabolic panel     Status: Abnormal   Collection Time: 06/05/23  3:59 PM  Result Value Ref Range   Sodium 140 135 - 145 mmol/L   Potassium 4.3 3.5 - 5.1 mmol/L   Chloride 110 98 - 111 mmol/L   CO2 23 22 - 32 mmol/L   Glucose, Bld 108 (H) 70 - 99 mg/dL    Comment: Glucose reference range applies only to samples taken after fasting for at least 8 hours.  BUN 21 8 - 23 mg/dL   Creatinine, Ser 7.72 (H) 0.61 -  1.24 mg/dL   Calcium  8.6 (L) 8.9 - 10.3 mg/dL   Total Protein 7.2 6.5 - 8.1 g/dL   Albumin  3.7 3.5 - 5.0 g/dL   AST 17 15 - 41 U/L   ALT 10 0 - 44 U/L   Alkaline Phosphatase 67 38 - 126 U/L   Total Bilirubin 0.6 0.0 - 1.2 mg/dL   GFR, Estimated 31 (L) >60 mL/min    Comment: (NOTE) Calculated using the CKD-EPI Creatinine Equation (2021)    Anion gap 7 5 - 15    Comment: Performed at [facility], [address], [place], [place] [address]  Troponin I (High Sensitivity)     Status: Abnormal   Collection Time: 06/05/23  3:59 PM  Result Value Ref Range   Troponin I (High Sensitivity) 21 (H) <18 ng/L    Comment: (NOTE) Elevated high sensitivity troponin I (hsTnI) values and significant  changes across serial measurements may suggest ACS but many other  chronic and acute conditions are known to elevate hsTnI results.  Refer to the Links section for chest pain algorithms and additional  guidance. Performed at [facility], [address] [place], [place] [address]   Troponin I (High Sensitivity)     Status: Abnormal   Collection Time: 06/05/23  6:19 PM  Result Value Ref Range   Troponin I (High Sensitivity) 20 (H) <18 ng/L    Comment: (NOTE) Elevated high sensitivity troponin I (hsTnI) values and significant  changes across serial measurements may suggest ACS but many other  chronic and acute conditions are known to elevate hsTnI results.  Refer to the Links section for chest pain algorithms and additional  guidance. Performed at [facility], [address], [place], [place] [address]   Cytology - Non PAP;     Status: None   Collection Time: 06/11/23  1:08 PM  Result Value Ref Range   CYTOLOGY - NON GYN      CYTOLOGY - NON PAP * THIS IS AN ADDENDUM REPORT * CASE: [id number] PATIENT: Jeanpaul Roedl Non-Gynecological Cytology Report *Addendum *  Reason for Addendum #1:  Immunohistochemistry results  Clinical  History: Right lower lobe lung nodule    FINAL MICROSCOPIC DIAGNOSIS: A. LUNG, RLL, FINE NEEDLE ASPIRATION: - Non-small cell lung carcinoma, see comment  B. LUNG, RLL, BRUSHING: - Atypical cells present  COMMENT:  Additional immunohistochemical stains for further characterization are pending and will be reported in an addendum.  This case was reviewed with Dr. Rebbecca who agrees with the above diagnosis.  SPECIMEN ADEQUACY: A. Satisfactory for Evaluation B. Satisfactory for Evaluation    GROSS: Received is/are (A)(1)2 Slides (1 Quick stain). (2)2 Slides (1 Quick stain). Also received are 10ccs of red cloudy saline solution from needle rinses. Biopsy/tissue pieces were included for cell block. (B)(1)2 Slides (1 Quick stain).(S A:ME:me) Smears: (A)4 (B)2 Concentration Method (Thin Prep): (A)1 (B)0 Cell Block: (A)1 Conventional (B)0 Additional Studies: Specimen C BAL [id number].    Final Diagnosis performed by Ilsa Pottier, MD.   Electronically signed 06/14/2023 Technical component performed at [facility]. [facility], [address], [place], [place] [address].  Professional component performed at [facility], [address], [place], [place] [address].  Immunohistochemistry Technical component (if applicable) was performed at [facility]. [address] [address], [place], [place] [address].   IMMUNOHISTOCHEMISTRY DISCLAIMER (if applicable): Some of  these immunohistochemical stains may have been developed and the performance characteristics determine by [facility]. Some may not have been cleared or approved by the U.S. Food and Drug Administration. The FDA has determined that such clearance or approval is not necessary. This test is  used for clinical purposes. It should not be regarded as investigational or for research. This laboratory is certified under the Clinical Laboratory Improvement Amendments of  1988 (CLIA-88) as qualified to perform high complexity clinical laboratory testing.  The controls stained appropriately.   IHC stains are performed on formalin fixed, paraffin embedded tissue using a 3,3diaminobenzidine (DAB) chromogen and Leica Bond Autostainer System. The staining intensity of the nucleus is score manually and is reported as the percentage of tumor cell nuclei demonstrating specific nuclear staining. The specimens are fixed in 10% Neutral Formalin for at least 6 hours and up to 72hrs. These tests are validated on decalcified tissue. Results should be interpreted with caution given the possibility of false negative results on decalcified specimens. Antibody Clones are as follows ER-clone 61F, PR-clone 16, Ki67- clone MM1. Some of these immunohistochemical stains may have been devel oped and the performance characteristics determined by [facility].  ADDENDUM: - Immunohistochemical stain PAX8 was performed and is negative. Morphologic findings are most consistent with lung adenocarcinoma. Control is adequate.         Addendum #1 performed by Ilsa Pottier, MD.   Electronically signed 06/14/2023 Technical component performed at [facility]. [facility], [address], [place], [place] [address].  Professional component performed at [facility], [address] [place], [place] [address].  Immunohistochemistry Technical component (if applicable) was performed at [facility]. [address], [place], [place] [address].   IMMUNOHISTOCHEMISTRY DISCLAIMER (if applicable): Some of these immunohistochemical stains may have been developed and the performance characteristics determine by [facility]. Some may not have been cleared or approved by the U.S. Food and Drug Adminis tration. The FDA has determined that such clearance or approval is not necessary. This test is used for clinical purposes.  It should not be regarded as investigational or for research. This laboratory is certified under the Clinical Laboratory Improvement Amendments of 1988 (CLIA-88) as qualified to perform high complexity clinical laboratory testing.  The controls stained appropriately.   IHC stains are performed on formalin fixed, paraffin embedded tissue using a 3,3diaminobenzidine (DAB) chromogen and Leica Bond Autostainer System. The staining intensity of the nucleus is score manually and is reported as the percentage of tumor cell nuclei demonstrating specific nuclear staining. The specimens are fixed in 10% Neutral Formalin for at least 6 hours and up to 72hrs. These tests are validated on decalcified tissue. Results should be interpreted with caution given the possibility of false negative results on decalcified specimens. Antibody Clones are as follows ER-clone  61F, PR-clone 16, Ki67- clone MM1. Some of these immunohistochemical stains may have been developed and the performance characteristics determined by [facility].   Cytology - Non PAP;     Status: None   Collection Time: 06/11/23  1:37 PM  Result Value Ref Range   CYTOLOGY - NON GYN      CYTOLOGY - NON PAP CASE: [id number] PATIENT: Onofre Genther Non-Gynecological Cytology Report     Clinical History: Right lower lobe lung nodule Specimen Submitted:  C. LUNG, RLL, LAVAGE:   FINAL MICROSCOPIC DIAGNOSIS: - No malignant cells identified  SPECIMEN ADEQUACY: Satisfactory for evaluation  GROSS: Received is/are 5cc's of red  fluid with mucoid material.(ME:me) Smears: 2 Concentration Method (Thin Prep): 0 Cell Block: 0 Additional Studies: N/A     Final Diagnosis performed by Ilsa Pottier, MD.   Electronically signed 06/15/2023 Technical component performed at [employer]. [facility], [address] [place], [place] [address].  Professional component performed at [facility], [address] [place], [place] [address].  Immunohistochemistry Technical component (if applicable) was performed at [facility]. [address], [place], [place] [address].   IMMUNOHISTOCHEMISTRY DISCLAIMER (if app licable): Some of these immunohistochemical stains may have been developed and the performance characteristics determine by [facility]. Some may not have been cleared or approved by the U.S. Food and Drug Administration. The FDA has determined that such clearance or approval is not necessary. This test is used for clinical purposes. It should not be regarded as investigational or for research. This laboratory is certified under the Clinical Laboratory Improvement Amendments of 1988 (CLIA-88) as qualified to perform high complexity clinical laboratory testing.  The controls stained appropriately.   IHC stains are performed on formalin fixed, paraffin embedded tissue using a 3,3diaminobenzidine (DAB) chromogen and Leica Bond Autostainer System. The staining intensity of the nucleus is score manually and is reported as the percentage of tumor cell nuclei demonstrating specific nuclear staining. The specimens are fixed in 10% Neutral Formalin for at least 6 hou rs and up to 72hrs. These tests are validated on decalcified tissue. Results should be interpreted with caution given the possibility of false negative results on decalcified specimens. Antibody Clones are as follows ER-clone 26F, PR-clone 16, Ki67- clone MM1. Some of these immunohistochemical stains may have been developed and the performance characteristics determined by [facility].   Fungus Culture With Stain     Status: None   Collection Time: 06/11/23  1:37 PM   Specimen: Bronchial Alveolar Lavage; Respiratory  Result Value Ref Range   Fungus Stain Final report    Fungus (Mycology) Culture Final report     Comment: (NOTE) Performed At: [facility] [address]  [place], [place] [id number] Jennette Shorter MD Ey:[id number]    Fungal Source BRONCHIAL ALVEOLAR LAVAGE     Comment: RLL Performed at [facility], [address] [place], [place] [address]   Culture, BAL-quantitative w Gram Stain     Status: None   Collection Time: 06/11/23  1:37 PM   Specimen: Bronchial Alveolar Lavage; Respiratory  Result Value Ref Range   Specimen Description BRONCHIAL ALVEOLAR LAVAGE    Special Requests RLL    Gram Stain NO WBC SEEN NO ORGANISMS SEEN     Culture      NO GROWTH 2 DAYS Performed at [facility], [address] [place], [place] [address]    Report Status 06/14/2023 FINAL   Acid Fast Culture with reflexed sensitivities     Status: None   Collection Time: 06/11/23  1:37 PM   Specimen: Bronchial Alveolar Lavage; Respiratory  Result Value Ref Range   Acid Fast Culture Negative     Comment: (NOTE) No acid fast bacilli isolated after 6 weeks. Performed At: [facility] [address] [place], [place] [id number] Jennette Shorter MD Ey:[id number]    Source of Sample BRONCHIAL ALVEOLAR LAVAGE     Comment: RLL Performed at [facility], [address] [place], [place] [address]   Acid Fast Smear (AFB)     Status: None   Collection Time: 06/11/23  1:37  PM   Specimen: Bronchial Alveolar Lavage; Respiratory  Result Value Ref Range   AFB Specimen Processing Concentration    Acid Fast Smear Negative     Comment: (NOTE) Performed At: [facility] [address] [place], [place] [id number] Jennette Shorter MD Ey:[id number]    Source (AFB) BRONCHIAL ALVEOLAR LAVAGE     Comment: RLL Performed at [facility], [address], [place], [place] [address]   Anaerobic culture w Gram Stain     Status: None   Collection Time: 06/11/23  1:37 PM   Specimen: Bronchoalveolar Lavage  Result Value Ref Range   Specimen Description BRONCHIAL ALVEOLAR LAVAGE    Special Requests RLL    Gram Stain NO WBC SEEN NO ORGANISMS  SEEN     Culture      NO ANAEROBES ISOLATED Performed at [facility], [address], [place], [place] [address]    Report Status 06/17/2023 FINAL   Fungus Culture Result     Status: None   Collection Time: 06/11/23  1:37 PM  Result Value Ref Range   Result 1 Comment     Comment: (NOTE) KOH/Calcofluor preparation:  no fungus observed. Performed At: [facility] [address] [name], [place] [id number] Jennette Shorter MD Ey:[id number]   Fungal organism reflex     Status: None   Collection Time: 06/11/23  1:37 PM  Result Value Ref Range   Fungal result 1 Comment     Comment: (NOTE) No yeast or mold isolated after 4 weeks. Performed At: [facility] [address] [place], [place] [id number] Jennette Shorter MD Ey:[id number]   CBC with Differential (Cancer Center Only)     Status: Abnormal   Collection Time: 06/27/23  2:26 PM  Result Value Ref Range   WBC Count 9.5 4.0 - 10.5 K/uL   RBC 3.57 (L) 4.22 - 5.81 MIL/uL   Hemoglobin 11.3 (L) 13.0 - 17.0 g/dL   HCT 66.7 (L) 60.9 - 47.9 %   MCV 93.0 80.0 - 100.0 fL   MCH 31.7 26.0 - 34.0 pg   MCHC 34.0 30.0 - 36.0 g/dL   RDW 85.0 88.4 - 84.4 %   Platelet Count 309 150 - 400 K/uL   nRBC 0.0 0.0 - 0.2 %   Neutrophils Relative % 65 %   Neutro Abs 6.1 1.7 - 7.7 K/uL   Lymphocytes Relative 26 %   Lymphs Abs 2.5 0.7 - 4.0 K/uL   Monocytes Relative 7 %   Monocytes Absolute 0.7 0.1 - 1.0 K/uL   Eosinophils Relative 2 %   Eosinophils Absolute 0.2 0.0 - 0.5 K/uL   Basophils Relative 0 %   Basophils Absolute 0.0 0.0 - 0.1 K/uL   Immature Granulocytes 0 %   Abs Immature Granulocytes 0.03 0.00 - 0.07 K/uL    Comment: Performed at [facility], [address] [place], [place] [address]  CMP (Cancer Center only)     Status: Abnormal   Collection Time: 06/27/23  2:26 PM  Result Value Ref Range   Sodium 140 135 - 145 mmol/L   Potassium 4.4 3.5 - 5.1 mmol/L   Chloride 111 98 - 111 mmol/L    CO2 25 22 - 32 mmol/L   Glucose, Bld 79 70 - 99 mg/dL    Comment: Glucose reference range applies only to samples taken after fasting for at least 8 hours.   BUN 23 8 - 23 mg/dL   Creatinine 7.77 (H) 9.38 -  1.24 mg/dL   Calcium  8.6 (L) 8.9 - 10.3 mg/dL   Total Protein 7.2 6.5 - 8.1 g/dL   Albumin  4.1 3.5 - 5.0 g/dL   AST 11 (L) 15 - 41 U/L   ALT 7 0 - 44 U/L   Alkaline Phosphatase 69 38 - 126 U/L   Total Bilirubin 0.6 0.0 - 1.2 mg/dL   GFR, Estimated 32 (L) >60 mL/min    Comment: (NOTE) Calculated using the CKD-EPI Creatinine Equation (2021)    Anion gap 4 (L) 5 - 15    Comment: Performed at [facility], [address], [place], [place] [address]  Glucose, capillary     Status: None   Collection Time: 06/29/23  2:19 PM  Result Value Ref Range   Glucose-Capillary 88 70 - 99 mg/dL    Comment: Glucose reference range applies only to samples taken after fasting for at least 8 hours.  Rad [place] Session Summary     Status: None   Collection Time: 07/17/23  2:31 PM  Result Value Ref Range   Course ID C1_Chest    Course Start Date 07/05/2023    Session Number 1    Course First Treatment Date 07/17/2023  2:27 PM    Course Last Treatment Date 07/17/2023  2:29 PM    Course Elapsed Days 0    Reference Point ID Lung_R_SBRT dp    Reference Point Dosage Given to Date 18 Gy   Reference Point Session Dosage Given 18 Gy   Plan ID Lung_R_SBRT    Plan Name Lung_R_SBRT    Plan Fractions Treated to Date 1    Plan Total Fractions Prescribed 3    Plan Prescribed Dose Per Fraction 18 Gy   Plan Total Prescribed Dose 54.000000 Gy   Plan Primary Reference Point Lung_R_SBRT dp   Rad Onc Aria Session Summary     Status: None   Collection Time: 07/19/23  9:24 AM  Result Value Ref Range   Course ID C1_Chest    Course Start Date 07/05/2023    Session Number 2    Course First Treatment Date 07/17/2023  2:27 PM    Course Last Treatment Date 07/19/2023  9:22 AM    Course Elapsed Days  2    Reference Point ID Lung_R_SBRT dp    Reference Point Dosage Given to Date 36 Gy   Reference Point Session Dosage Given 18 Gy   Plan ID Lung_R_SBRT    Plan Name Lung_R_SBRT    Plan Fractions Treated to Date 2    Plan Total Fractions Prescribed 3    Plan Prescribed Dose Per Fraction 18 Gy   Plan Total Prescribed Dose 54.000000 Gy   Plan Primary Reference Point Lung_R_SBRT dp   Rad Onc Aria Session Summary     Status: None   Collection Time: 07/24/23 12:00 PM  Result Value Ref Range   Course ID C1_Chest    Course Start Date 07/05/2023    Session Number 3    Course First Treatment Date 07/17/2023  2:27 PM    Course Last Treatment Date 07/24/2023 11:58 AM    Course Elapsed Days 7    Reference Point ID Lung_R_SBRT dp    Reference Point Dosage Given to Date 54 Gy   Reference Point Session Dosage Given 18 Gy   Plan ID Lung_R_SBRT    Plan Name Lung_R_SBRT    Plan Fractions Treated to Date 3    Plan Total Fractions Prescribed 3  Plan Prescribed Dose Per Fraction 18 Gy   Plan Total Prescribed Dose 54.000000 Gy   Plan Primary Reference Point Lung_R_SBRT dp   Protime-INR     Status: None   Collection Time: 08/01/23  1:19 PM  Result Value Ref Range   Prothrombin Time 14.6 11.4 - 15.2 seconds   INR 1.1 0.8 - 1.2    Comment: (NOTE) INR goal varies based on device and disease states. Performed at [facility], [address] [place], [place] [address]   APTT     Status: None   Collection Time: 08/01/23  1:19 PM  Result Value Ref Range   aPTT 34 24 - 36 seconds    Comment: Performed at [facility], [address], [place], [place] [address]  CBC     Status: Abnormal   Collection Time: 08/01/23  1:19 PM  Result Value Ref Range   WBC 7.4 4.0 - 10.5 K/uL   RBC 3.89 (L) 4.22 - 5.81 MIL/uL   Hemoglobin 12.6 (L) 13.0 - 17.0 g/dL   HCT 62.6 (L) 60.9 - 47.9 %   MCV 95.9 80.0 - 100.0 fL   MCH 32.4 26.0 - 34.0 pg   MCHC 33.8 30.0 - 36.0 g/dL   RDW 85.3 88.4 - 84.4 %    Platelets 267 150 - 400 K/uL   nRBC 0.0 0.0 - 0.2 %    Comment: Performed at [facility], [address], [place], [place] [address]  Differential     Status: Abnormal   Collection Time: 08/01/23  1:19 PM  Result Value Ref Range   Neutrophils Relative % 64 %   Neutro Abs 4.8 1.7 - 7.7 K/uL   Lymphocytes Relative 21 %   Lymphs Abs 1.5 0.7 - 4.0 K/uL   Monocytes Relative 6 %   Monocytes Absolute 0.5 0.1 - 1.0 K/uL   Eosinophils Relative 8 %   Eosinophils Absolute 0.6 (H) 0.0 - 0.5 K/uL   Basophils Relative 1 %   Basophils Absolute 0.0 0.0 - 0.1 K/uL   Immature Granulocytes 0 %   Abs Immature Granulocytes 0.01 0.00 - 0.07 K/uL    Comment: Performed at [facility], [address] [place], [place] [address]  Comprehensive metabolic panel     Status: Abnormal   Collection Time: 08/01/23  1:19 PM  Result Value Ref Range   Sodium 141 135 - 145 mmol/L   Potassium 4.7 3.5 - 5.1 mmol/L   Chloride 115 (H) 98 - 111 mmol/L   CO2 20 (L) 22 - 32 mmol/L   Glucose, Bld 98 70 - 99 mg/dL    Comment: Glucose reference range applies only to samples taken after fasting for at least 8 hours.   BUN 19 8 - 23 mg/dL   Creatinine, Ser 7.51 (H) 0.61 - 1.24 mg/dL   Calcium  9.3 8.9 - 10.3 mg/dL   Total Protein 7.5 6.5 - 8.1 g/dL   Albumin  3.7 3.5 - 5.0 g/dL   AST 21 15 - 41 U/L   ALT 16 0 - 44 U/L   Alkaline Phosphatase 65 38 - 126 U/L   Total Bilirubin 0.9 0.0 - 1.2 mg/dL   GFR, Estimated 28 (L) >60 mL/min    Comment: (NOTE) Calculated using the CKD-EPI Creatinine Equation (2021)    Anion gap 6 5 - 15    Comment: Performed at [facility], [address] [place], [place] [address]  Ethanol  Status: None   Collection Time: 08/01/23  1:19 PM  Result Value Ref Range   Alcohol, Ethyl (B) <15 <15 mg/dL    Comment: (NOTE) For medical purposes only. Performed at [facility], [address] [place], [place] [address]   Creatinine, serum     Status: Abnormal    Collection Time: 08/01/23  1:19 PM  Result Value Ref Range   Creatinine, Ser 2.57 (H) 0.61 - 1.24 mg/dL   GFR, Estimated 27 (L) >60 mL/min    Comment: (NOTE) Calculated using the CKD-EPI Creatinine Equation (2021) Performed at [facility], [address] [place], [place] [address]   I-stat chem 8, ED     Status: Abnormal   Collection Time: 08/01/23  1:30 PM  Result Value Ref Range   Sodium 143 135 - 145 mmol/L   Potassium 4.9 3.5 - 5.1 mmol/L   Chloride 115 (H) 98 - 111 mmol/L   BUN 24 (H) 8 - 23 mg/dL   Creatinine, Ser 7.49 (H) 0.61 - 1.24 mg/dL   Glucose, Bld 93 70 - 99 mg/dL    Comment: Glucose reference range applies only to samples taken after fasting for at least 8 hours.   Calcium , Ion 1.18 1.15 - 1.40 mmol/L   TCO2 19 (L) 22 - 32 mmol/L   Hemoglobin 12.9 (L) 13.0 - 17.0 g/dL   HCT 61.9 (L) 60.9 - 47.9 %  CBG monitoring, ED     Status: None   Collection Time: 08/01/23  1:52 PM  Result Value Ref Range   Glucose-Capillary 86 70 - 99 mg/dL    Comment: Glucose reference range applies only to samples taken after fasting for at least 8 hours.   Comment 1 Notify RN   Basic metabolic panel with GFR     Status: Abnormal   Collection Time: 08/02/23  3:44 AM  Result Value Ref Range   Sodium 136 135 - 145 mmol/L   Potassium 4.1 3.5 - 5.1 mmol/L   Chloride 112 (H) 98 - 111 mmol/L   CO2 20 (L) 22 - 32 mmol/L   Glucose, Bld 121 (H) 70 - 99 mg/dL    Comment: Glucose reference range applies only to samples taken after fasting for at least 8 hours.   BUN 24 (H) 8 - 23 mg/dL   Creatinine, Ser 7.27 (H) 0.61 - 1.24 mg/dL   Calcium  8.4 (L) 8.9 - 10.3 mg/dL   GFR, Estimated 25 (L) >60 mL/min    Comment: (NOTE) Calculated using the CKD-EPI Creatinine Equation (2021)    Anion gap 4 (L) 5 - 15    Comment: Performed at [facility], [address] [place], [place] [address]  CBC     Status: Abnormal   Collection Time: 08/02/23  3:44 AM  Result Value Ref Range   WBC 8.8  4.0 - 10.5 K/uL   RBC 3.34 (L) 4.22 - 5.81 MIL/uL   Hemoglobin 10.7 (L) 13.0 - 17.0 g/dL   HCT 68.4 (L) 60.9 - 47.9 %   MCV 94.3 80.0 - 100.0 fL   MCH 32.0 26.0 - 34.0 pg   MCHC 34.0 30.0 - 36.0 g/dL   RDW 85.4 88.4 - 84.4 %   Platelets 244 150 - 400 K/uL   nRBC 0.0 0.0 - 0.2 %    Comment: Performed at [facility], [address] [place], [place] [address]  CBC     Status: Abnormal   Collection Time: 08/03/23  3:44 AM  Result Value Ref Range   WBC 6.7 4.0 - 10.5 K/uL   RBC 3.45 (L) 4.22 - 5.81 MIL/uL   Hemoglobin 11.1 (L) 13.0 - 17.0 g/dL   HCT 67.1 (L) 60.9 - 47.9 %   MCV 95.1 80.0 - 100.0 fL   MCH 32.2 26.0 - 34.0 pg   MCHC 33.8 30.0 - 36.0 g/dL   RDW 85.6 88.4 - 84.4 %   Platelets 238 150 - 400 K/uL   nRBC 0.0 0.0 - 0.2 %    Comment: Performed at [facility], [address] [place], [place] [address]  Basic metabolic panel with GFR     Status: Abnormal   Collection Time: 08/03/23  3:44 AM  Result Value Ref Range   Sodium 141 135 - 145 mmol/L   Potassium 4.0 3.5 - 5.1 mmol/L   Chloride 113 (H) 98 - 111 mmol/L   CO2 20 (L) 22 - 32 mmol/L   Glucose, Bld 87 70 - 99 mg/dL    Comment: Glucose reference range applies only to samples taken after fasting for at least 8 hours.   BUN 25 (H) 8 - 23 mg/dL   Creatinine, Ser 7.68 (H) 0.61 - 1.24 mg/dL   Calcium  8.7 (L) 8.9 - 10.3 mg/dL   GFR, Estimated 31 (L) >60 mL/min    Comment: (NOTE) Calculated using the CKD-EPI Creatinine Equation (2021)    Anion gap 8 5 - 15    Comment: Performed at [facility], [address] [place], [place] [address]  ECHOCARDIOGRAM COMPLETE     Status: None   Collection Time: 08/03/23 10:33 AM  Result Value Ref Range   Weight 2,465.6 oz   Height 72 in   BP 139/79 mmHg   Single Plane A2C EF 57.9 %   Single Plane A4C EF 60.9 %   Calc EF 58.5 %   S' Lateral 2.95 cm   Area-P 1/2 2.24 cm2   Est EF 60 - 65%         Beverley Adine Hummer, MD, MS

## 2023-08-22 NOTE — Progress Notes (Signed)
  Radiation Oncology         [phone] ________________________________  Name: Kevin [name] MRN: [id number]  Date of Service: 08/27/2023  DOB: January 10, 1956  Post Treatment Telephone Note  Diagnosis:  Stage IA2, cT1bN0M0,  NSCLC, adenocarcinoma of the RLL (as documented in provider EOT note)  The patient was not available for call today. Unable to reach patient for today's appointment. 2 calls. No answer. Message was left w/ my extension [phone]. Call complete.  The patient has scheduled follow up with his medical oncologist Dr. Sherrod for ongoing care, and was encouraged to call if he  develops concerns or questions regarding radiation.   This concludes the interaction.  Rosaline Minerva, LPN

## 2023-08-23 ENCOUNTER — Other Ambulatory Visit: Payer: Self-pay

## 2023-08-24 ENCOUNTER — Other Ambulatory Visit (HOSPITAL_COMMUNITY): Payer: Self-pay

## 2023-08-24 ENCOUNTER — Telehealth: Payer: Self-pay

## 2023-08-24 NOTE — Telephone Encounter (Signed)
 Pharmacy Patient Advocate Encounter  Insurance verification completed.   The patient is insured through [employer] claim for Dovato . Currently a quantity of 30 is a 30 day supply and the co-pay is 0.00 . Cabenuva is covered under pharmacy benefits.  This test claim was processed through [facility]- copay amounts may vary at other pharmacies due to pharmacy/plan contracts, or as the patient moves through the different stages of their insurance plan.

## 2023-08-27 ENCOUNTER — Ambulatory Visit
Admission: RE | Admit: 2023-08-27 | Discharge: 2023-08-27 | Disposition: A | Discharge status: Home / Self Care | Source: Ambulatory Visit | Attending: Internal Medicine | Admitting: Internal Medicine

## 2023-08-27 DIAGNOSIS — C3431 Malignant neoplasm of lower lobe, right bronchus or lung: Secondary | ICD-10-CM | POA: Insufficient documentation

## 2023-08-27 DIAGNOSIS — Z510 Encounter for antineoplastic radiation therapy: Secondary | ICD-10-CM | POA: Insufficient documentation

## 2023-08-29 DIAGNOSIS — Z79899 Other long term (current) drug therapy: Secondary | ICD-10-CM | POA: Diagnosis not present

## 2023-08-30 ENCOUNTER — Ambulatory Visit: Admitting: Infectious Diseases

## 2023-08-31 ENCOUNTER — Ambulatory Visit: Admitting: Infectious Diseases

## 2023-08-31 ENCOUNTER — Other Ambulatory Visit: Payer: Self-pay

## 2023-08-31 ENCOUNTER — Encounter: Payer: Self-pay | Admitting: Infectious Diseases

## 2023-08-31 ENCOUNTER — Other Ambulatory Visit (HOSPITAL_COMMUNITY)
Admission: RE | Admit: 2023-08-31 | Discharge: 2023-08-31 | Disposition: A | Discharge status: Home / Self Care | Source: Ambulatory Visit | Attending: Infectious Diseases | Admitting: Infectious Diseases

## 2023-08-31 VITALS — BP 165/105 | HR 59 | Temp 97.9°F | Ht 72.0 in | Wt 155.6 lb

## 2023-08-31 DIAGNOSIS — Z113 Encounter for screening for infections with a predominantly sexual mode of transmission: Secondary | ICD-10-CM | POA: Insufficient documentation

## 2023-08-31 DIAGNOSIS — B20 Human immunodeficiency virus [HIV] disease: Secondary | ICD-10-CM | POA: Diagnosis not present

## 2023-08-31 DIAGNOSIS — Z0000 Encounter for general adult medical examination without abnormal findings: Secondary | ICD-10-CM | POA: Insufficient documentation

## 2023-08-31 DIAGNOSIS — Z7185 Encounter for immunization safety counseling: Secondary | ICD-10-CM | POA: Diagnosis not present

## 2023-08-31 MED ORDER — DOVATO 50-300 MG PO TABS
ORAL_TABLET | ORAL | 5 refills | Status: DC
  Filled 2023-09-03 – 2023-12-05 (×2): qty 30, 30d supply, fill #0

## 2023-08-31 NOTE — Progress Notes (Signed)
 [address], [place], [place], [address]                                                                  Phn. [phone]; Fax: [phone]                                                                             Date: 08/31/23  Reason for Visit: Routine HIV care.    HPI: Kevin [name] is a 68 y.o.old male with PMH as below including CHF, CKD, PAD, COPD, HTN, OSA, A fib, h/o substance use, h/o renal cell ca and NSCLC who is here for HIV fu. He was last seen by Dr Fleeta Rothman in May 2024 and was well controlled on Dovato .   Recently admitted in June for Hypertensive urgency.   Interval hx/current visit: Reports being compliant with Dovato  without missed doses or concerns. He reports seeing seeing his PCP on 7/8. Reports he has completed radiation tx for his lung cancer and being monitored by Dr Gatha.  Denies being sexually active or smoking, alcohol and use of recreational drugs. He is not ready for Hep A #2. Discussed about dental care and agreed for referral. No other complaints  ROS: As stated in above HPI; all other systems were reviewed and are otherwise negative unless noted below  No reported fever / chills, night sweats, unintentional weight loss, acute visual change, odynophagia, chest pain/pressure, new or worsened SOB or WOB, nausea, vomiting, diarrhea, dysuria, GU discharge, syncope, seizures, red/hot swollen joints, hallucinations / delusions, rashes, new allergies, unusual / excessive bleeding, swollen lymph nodes PMH/ PSH/ FamHx / Social Hx , medications and allergies reviewed and updated as appropriate; please see corresponding tab in EHR / prior notes                                        Current Outpatient Medications on File Prior to Visit  Medication Sig Dispense  Refill   dolutegravir -lamiVUDine  (DOVATO ) 50-300 MG tablet Take 1 tablet by mouth daily. 30 tablet 0   albuterol  (VENTOLIN  HFA) 108 (90 Base) MCG/ACT inhaler Inhale 1-2 puffs into the lungs every 6 (six) hours as needed for wheezing or shortness of breath. (Patient taking differently: Inhale 2-4 puffs into the lungs every 2 (two) hours as needed for wheezing or shortness of breath.) 6.7 g 3   albuterol  (VENTOLIN  HFA) 108 (90 Base) MCG/ACT inhaler Inhale 1-2 puffs into the lungs every 6 (six) hours as needed for wheezing or shortness of breath. 8 g 3  amLODipine -valsartan  (EXFORGE ) 10-320 MG tablet Take 1 tablet by mouth daily. 90 tablet 2   aspirin  EC (ASPIRIN  LOW DOSE) 81 MG tablet Take 1 tablet (81 mg total) by mouth daily, swallow whole 30 tablet 12   Aspirin -Salicylamide-Caffeine (BC HEADACHE POWDER PO) Take 1 packet by mouth every 8 (eight) hours as needed (for headaches).     atorvastatin  (LIPITOR) 80 MG tablet Take 1 tablet (80 mg total) by mouth daily. 90 tablet 3   baclofen (LIORESAL) 10 MG tablet Take 10 mg by mouth 3 (three) times daily.     busPIRone  (BUSPAR ) 7.5 MG tablet Take 1 tablet (7.5 mg total) by mouth in the morning and at bedtime. 180 tablet 0   celecoxib (CELEBREX) 200 MG capsule Take 200 mg by mouth 2 (two) times daily as needed (for pain).     ciclopirox  (PENLAC ) 8 % solution Apply topically at bedtime. Apply over nail and surrounding skin. Apply daily over previous coat. After seven (7) days, may remove with alcohol and continue cycle. 6.6 mL 2   Fluticasone -Umeclidin-Vilant (TRELEGY ELLIPTA ) 200-62.5-25 MCG/ACT AEPB Inhale 1 puff into the lungs daily. 60 each 5   hydrALAZINE  (APRESOLINE ) 25 MG tablet Take 1 tablet (25 mg total) by mouth 2 (two) times daily. 180 tablet 3   ipratropium-albuterol  (DUONEB) 0.5-2.5 (3) MG/3ML SOLN Take 3 mLs by nebulization every 6 (six) hours as needed. (Patient taking differently: Take 3 mLs by nebulization every 6 (six) hours as needed (for  shortness of breath or wheezing).) 360 mL 12   montelukast  (SINGULAIR ) 10 MG tablet Take 1 tablet (10 mg total) by mouth at bedtime. 30 tablet 5   nebivolol  (BYSTOLIC ) 5 MG tablet Take 1 tablet (5 mg total) by mouth daily. 90 tablet 3   temazepam  (RESTORIL ) 30 MG capsule Take 1 capsule (30 mg total) by mouth at bedtime as needed for sleep. 30 capsule 5   Vitamin D , Ergocalciferol , (DRISDOL ) 1.25 MG (50000 UNIT) CAPS capsule Take one capsule (50,000 Units total) by mouth once a week for 12 weeks, then recheck level in the office (Patient taking differently: Take 50,000 Units by mouth every Monday.) 12 capsule 0   No current facility-administered medications on file prior to visit.    Allergies  Allergen Reactions   Isosorbide  Mononitrate [Isosorbide  Nitrate] Shortness Of Breath    Past Medical History:  Diagnosis Date   AIDS (acquired immune deficiency syndrome) (HCC) 08/17/2016   Amaurosis fugax 08/18/2022   Chronic diastolic CHF (congestive heart failure), NYHA class 3 (HCC) 01/2016   Chronic lower back pain    CKD (chronic kidney disease), stage III (HCC)    stage 3b   COPD (chronic obstructive pulmonary disease) (HCC)    Dyspnea    Gout    forearms, hands, ankles, feet (06/05/2016)   Headache    weekly (06/05/2016)   Heart murmur    never has caused any problems per pt   History of herpes zoster virus 01/2023   Hypertension    Hypertensive crisis 08/15/2017   Lipoma 07/06/2022   Neuromuscular disorder (HCC) 02/05/2023   post herpetic neuralgia    -  Primary   OSA on CPAP    does not use CPAP   PAD (peripheral artery disease) (HCC)    PAF (paroxysmal atrial fibrillation) (HCC) 01/2016   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   Pulmonary nodules 02/2023   Substance abuse (HCC)    in past   Past Surgical History:  Procedure Laterality Date  BRONCHIAL BIOPSY  06/11/2023   Procedure: BRONCHOSCOPY, WITH BIOPSY;  Surgeon: Shelah Lamar RAMAN, MD;  Location: [facility]  ENDOSCOPY;  Service: Pulmonary;;   BRONCHIAL BRUSHINGS  06/11/2023   Procedure: BRONCHOSCOPY, WITH BRUSH BIOPSY;  Surgeon: Shelah Lamar RAMAN, MD;  Location: [facility] ENDOSCOPY;  Service: Pulmonary;;   BRONCHIAL NEEDLE ASPIRATION BIOPSY  06/11/2023   Procedure: BRONCHOSCOPY, WITH NEEDLE ASPIRATION BIOPSY;  Surgeon: Shelah Lamar RAMAN, MD;  Location: [facility] ENDOSCOPY;  Service: Pulmonary;;   BRONCHIAL WASHINGS  06/11/2023   Procedure: IRRIGATION, BRONCHUS;  Surgeon: Shelah Lamar RAMAN, MD;  Location: [facility] ENDOSCOPY;  Service: Pulmonary;;   COLONOSCOPY N/A 04/24/2023   Procedure: COLONOSCOPY;  Surgeon: Rosalie Kitchens, MD;  Location: [facility] ENDOSCOPY;  Service: Gastroenterology;  Laterality: N/A;   IR RADIOLOGIST EVAL & MGMT  05/31/2021   IR RADIOLOGIST EVAL & MGMT  07/26/2021   LEFT HEART CATH AND CORONARY ANGIOGRAPHY N/A 10/23/2016   Procedure: LEFT HEART CATH AND CORONARY ANGIOGRAPHY;  Surgeon: Anner Alm ORN, MD;  Location: [facility] INVASIVE CV LAB;  Service: Cardiovascular;  Laterality: N/A;   LOWER EXTREMITY ANGIOGRAPHY N/A 07/17/2016   Procedure: Lower Extremity Angiography;  Surgeon: Court Dorn PARAS, MD;  Location: [facility] INVASIVE CV LAB;  Service: Cardiovascular;  Laterality: N/A;   LOWER EXTREMITY INTERVENTION N/A 06/05/2016   Procedure: Lower Extremity Intervention;  Surgeon: Dorn PARAS Court, MD;  Location: [facility] INVASIVE CV LAB;  Service: Cardiovascular;  Laterality: N/A;   PERIPHERAL VASCULAR ATHERECTOMY Right 07/17/2016   Procedure: Peripheral Vascular Atherectomy;  Surgeon: Court Dorn PARAS, MD;  Location: [facility] INVASIVE CV LAB;  Service: Cardiovascular;  Laterality: Right;  SFA   PERIPHERAL VASCULAR INTERVENTION  06/05/2016   Procedure: Peripheral Vascular Intervention;  Surgeon: Dorn PARAS Court, MD;  Location: [facility] INVASIVE CV LAB;  Service: Cardiovascular;;  left SFA   POLYPECTOMY  04/24/2023   Procedure: POLYPECTOMY;  Surgeon: Rosalie Kitchens, MD;  Location: [facility] ENDOSCOPY;  Service: Gastroenterology;;   RADIOLOGY WITH ANESTHESIA Right  06/29/2021   Procedure: RIGHT RENAL CRYOABLATION;  Surgeon: Philip Cornet, MD;  Location: [facility] ORS;  Service: Anesthesiology;  Laterality: Right;   VIDEO BRONCHOSCOPY WITH ENDOBRONCHIAL NAVIGATION Right 06/11/2023   Procedure: VIDEO BRONCHOSCOPY WITH ENDOBRONCHIAL NAVIGATION;  Surgeon: Shelah Lamar RAMAN, MD;  Location: [facility] ENDOSCOPY;  Service: Pulmonary;  Laterality: Right;  WITH FLUORO AND BIOPSY   Social History   Socioeconomic History   Marital status: Significant Other    Spouse name: Not on file   Number of children: Not on file   Years of education: Not on file   Highest education level: Not on file  Occupational History   Not on file  Tobacco Use   Smoking status: Some Days    Current packs/day: 0.10    Average packs/day: 0.1 packs/day for 42.0 years (4.2 ttl pk-yrs)    Types: Cigarettes    Passive exposure: Never   Smokeless tobacco: Never   Tobacco comments:    Has not smoked since May 4th.2025-06/20/2023  Vaping Use   Vaping status: Former   Substances: Nicotine , Flavoring  Substance and Sexual Activity   Alcohol use: Not Currently    Alcohol/week: 2.0 standard drinks of alcohol    Types: 2 Cans of beer per week    Comment: rare   Drug use: Not Currently    Comment: rare   Sexual activity: Not Currently    Comment: declined condoms  Other Topics Concern   Not on file  Social History Narrative   Right handed   Social Drivers of  Health   Financial Resource Strain: High Risk (10/05/2022)   Overall Financial Resource Strain (CARDIA)    Difficulty of Paying Living Expenses: Hard  Food Insecurity: No Food Insecurity (08/02/2023)   Hunger Vital Sign    Worried About Running Out of Food in the Last Year: Never true    Ran Out of Food in the Last Year: Never true  Transportation Needs: No Transportation Needs (08/02/2023)   PRAPARE - Administrator, Civil Service (Medical): No    Lack of Transportation (Non-Medical): No  Physical Activity: Inactive (10/05/2022)    Exercise Vital Sign    Days of Exercise per Week: 0 days    Minutes of Exercise per Session: 0 min  Stress: Stress Concern Present (05/04/2022)   [employer] of Occupational Health - Occupational Stress Questionnaire    Feeling of Stress : To some extent  Social Connections: Moderately Isolated (08/02/2023)   Social Connection and Isolation Panel    Frequency of Communication with Friends and Family: More than three times a week    Frequency of Social Gatherings with Friends and Family: Once a week    Attends Religious Services: Never    Database administrator or Organizations: No    Attends Banker Meetings: Never    Marital Status: Married  Catering manager Violence: Not At Risk (08/02/2023)   Humiliation, Afraid, Rape, and Kick questionnaire    Fear of Current or Ex-Partner: No    Emotionally Abused: No    Physically Abused: No    Sexually Abused: No   Vitals BP (!) 165/105   Pulse (!) 59   Temp 97.9 F (36.6 C) (Oral)   Ht 6' (1.829 m)   Wt 155 lb 9.6 oz (70.6 kg)   SpO2 97%   BMI 21.10 kg/m   Examination  Gen: no acute distress HEENT: [place]/AT, no scleral icterus, no pale conjunctivae, hearing normal, oral mucosa moist Neck: Supple Cardio: Regular rate and rhythm, s1s2 Resp: Pulmonary effort normal in room air, Normal breath sounds  GI: nondistended GU: Musc: Extremities: No pedal edema Skin: No rashes Neuro: grossly non focal , awake, alert and oriented * 3  Psych: Calm, cooperative    Lab Results HIV 1 RNA Quant (Copies/mL)  Date Value  07/06/2022 Not Detected  12/07/2021 Not Detected  08/08/2021 Not Detected   CD4 T Cell Abs (/uL)  Date Value  07/06/2022 842  12/07/2021 1,104  06/15/2021 918   No results found for: HIV1GENOSEQ Lab Results  Component Value Date   WBC 6.7 08/03/2023   HGB 11.1 (L) 08/03/2023   HCT 32.8 (L) 08/03/2023   MCV 95.1 08/03/2023   PLT 238 08/03/2023    Lab Results  Component Value Date   CREATININE  2.31 (H) 08/03/2023   BUN 25 (H) 08/03/2023   NA 141 08/03/2023   K 4.0 08/03/2023   CL 113 (H) 08/03/2023   CO2 20 (L) 08/03/2023   Lab Results  Component Value Date   ALT 16 08/01/2023   AST 21 08/01/2023   ALKPHOS 65 08/01/2023   BILITOT 0.9 08/01/2023    Lab Results  Component Value Date   CHOL 190 10/24/2022   TRIG 79 10/24/2022   HDL 79 10/24/2022   LDLCALC 97 10/24/2022   Lab Results  Component Value Date   HAV NON REACTIVE 07/03/2016   Lab Results  Component Value Date   HEPBSAG NEGATIVE 07/03/2016   HEPBSAB NEG 07/03/2016   Lab Results  Component Value Date   HCVAB NEGATIVE 07/03/2016   Lab Results  Component Value Date   CHLAMYDIAWP Negative 12/07/2021   N Negative 12/07/2021   No results found for: GCPROBEAPT Lab Results  Component Value Date   QUANTGOLD NEGATIVE 07/03/2016    Health Maintenance: Immunization History  Administered Date(s) Administered   Hepatitis A, Adult 08/18/2016   Hepatitis B, ADULT 08/17/2016, 09/18/2016, 08/15/2017   Influenza, Seasonal, Injecte, Preservative Fre 12/14/2022   Influenza,inj,Quad PF,6+ Mos 02/10/2016, 11/02/2016, 04/05/2019, 01/20/2022   Pneumococcal Conjugate-13 08/15/2017   Pneumococcal Polysaccharide-23 02/10/2016, 04/05/2019   Assessment/Plan: # HIV - continue Dovato  as is  - 6/20 CBC and BMP reviewed and discussed  - labs today  - fu in 3 months with Dr Fleeta Rothman   # HTN/CKD/CHF/A fib/COPD - fu with PCP and respective specialists  # NSCLC/Renal cell ca - fu with Oncology   # STD screening  - no acute concerns  - Urine GC and RPR  # Immunization  - declined   # Health maintenance - Dental referral completed - fu with PCP for colon ca screening   Patient's labs were reviewed as well as his previous records. Patients questions were addressed and answered. Safe sex counseling done.   I spent 40 minutes involved in face-to-face and non-face-to-face activities for this patient on the  day of the visit. Professional time spent includes the following activities: Preparing to see the patient (review of tests), Obtaining and reviewing separately obtained history (discharge record 6/20, Prior notes from Dr Fleeta Rothman, PCP notes on 7/8 ), Performing a medically appropriate examination and evaluation , Ordering medications/labs,  Documenting clinical information in the EMR, Independently interpreting results (not separately reported), Communicating results to the patient, Counseling and educating the patient.  Of note, portions of this note may have been created with voice recognition software. While this note has been edited for accuracy, occasional wrong-word or 'sound-a-like' substitutions may have occurred due to the inherent limitations of voice recognition software.   Electronically signed by:  Annalee Orem, MD Infectious Disease Physician [facility] [address] [place], [place] [address] Phone: [phone]  Fax: [phone]

## 2023-08-31 NOTE — Patient Instructions (Signed)
 [email or web address]

## 2023-09-03 ENCOUNTER — Other Ambulatory Visit: Payer: Self-pay

## 2023-09-03 LAB — T-HELPER CELLS (CD4) COUNT (NOT AT ARMC)
Absolute CD4: 843 {cells}/uL (ref 490–1740)
CD4 T Helper %: 58 % (ref 30–61)
Total lymphocyte count: 1459 {cells}/uL (ref 850–3900)

## 2023-09-03 LAB — URINE CYTOLOGY ANCILLARY ONLY
Chlamydia: NEGATIVE
Comment: NEGATIVE
Comment: NORMAL
Neisseria Gonorrhea: NEGATIVE

## 2023-09-03 LAB — RPR: RPR Ser Ql: NONREACTIVE

## 2023-09-03 LAB — HIV RNA, RTPCR W/R GT (RTI, PI,INT)
HIV 1 RNA Quant: NOT DETECTED {copies}/mL
HIV-1 RNA Quant, Log: NOT DETECTED {Log_copies}/mL

## 2023-09-04 ENCOUNTER — Encounter: Payer: Self-pay | Admitting: Podiatry

## 2023-09-04 ENCOUNTER — Ambulatory Visit (INDEPENDENT_AMBULATORY_CARE_PROVIDER_SITE_OTHER): Admitting: Podiatry

## 2023-09-04 DIAGNOSIS — L84 Corns and callosities: Secondary | ICD-10-CM | POA: Diagnosis not present

## 2023-09-04 DIAGNOSIS — I739 Peripheral vascular disease, unspecified: Secondary | ICD-10-CM | POA: Diagnosis not present

## 2023-09-04 DIAGNOSIS — B351 Tinea unguium: Secondary | ICD-10-CM

## 2023-09-04 DIAGNOSIS — G8929 Other chronic pain: Secondary | ICD-10-CM

## 2023-09-04 DIAGNOSIS — M79674 Pain in right toe(s): Secondary | ICD-10-CM

## 2023-09-04 NOTE — Patient Instructions (Addendum)
 See if you have anymore of the LYRICA  (PREGABALIN ) at home

## 2023-09-05 ENCOUNTER — Ambulatory Visit: Payer: Self-pay | Admitting: Infectious Diseases

## 2023-09-06 ENCOUNTER — Other Ambulatory Visit: Payer: Self-pay

## 2023-09-07 ENCOUNTER — Other Ambulatory Visit: Payer: Self-pay

## 2023-09-08 NOTE — Progress Notes (Signed)
  Subjective:  Patient ID: [name], male    DOB: 19-Dec-1955,  MRN: [id number]  Chief Complaint  Patient presents with   Nail Problem    Rm13 Patient is here for nail and callous care.     History of Present Illness Kevin [name] is a 68 year old male with circulation issues who presents with significant pain due to calluses and thickened toenails, which he is not able to trim himself.  He does not report any open lesions or any injuries.  He states that the pain to his feet does limit his activity.  No injuries.  He has previously seen other providers for the same issue.    Objective:    Physical Exam General: AAO x3, NAD  Dermatological: The toenails are all quite hypertrophic, dystrophic with yellow discoloration with tenderness palpation mostly to the toes on the left side.  There is no edema, erythema or signs of infection.  There are no open lesions noted bilaterally but there is thick preulcerative calluses submetatarsal 5 on the left foot worse on the right.  There is no underlying ulceration.  Vascular: Dorsalis Pedis artery and Posterior Tibial artery pedal pulses are decreased bilateral with immedate capillary fill time.There is no pain with calf compression, swelling, warmth, erythema.   Neruologic: Grossly intact via light touch bilateral.   Musculoskeletal: Tenderness on the hyperkeratotic lesions as well as toenails.     Assessment:   Symptomatic right mycosis, hyperkeratotic lesions with history of PAD  Plan:  Patient was evaluated and treated and all questions answered.  Assessment and Plan Assessment & Plan  Chronic foot pain -Given ongoing chronic foot pain we discussed both conservative as well as surgical treatment options.  Discussed with him given his history of PAD he is at high risk of complications for surgery and would like to continue with conservative care. -Continue Lyrica  -Will order updated ABIs  Symptomatic onychomycosis -Sharply  debrided nails x 10 without any complications or bleeding.  Hyperkeratotic preulcerative calluses - Sharply debrided hyperkeratotic lesions x 2 with any complications or bleeding. - He was seen today by Lolita, pedorthist to try to help make it accommodative insert to help offload.  Return in about 3 months (around 12/05/2023).  Kevin [name] Fees DPM

## 2023-09-12 ENCOUNTER — Encounter (HOSPITAL_COMMUNITY): Payer: Self-pay

## 2023-09-24 ENCOUNTER — Other Ambulatory Visit: Payer: Self-pay | Admitting: Family Medicine

## 2023-09-24 DIAGNOSIS — J449 Chronic obstructive pulmonary disease, unspecified: Secondary | ICD-10-CM

## 2023-09-24 NOTE — Telephone Encounter (Unsigned)
 Copied from CRM [id number]. Topic: Clinical - Medication Refill >> Sep 24, 2023  3:06 PM Tanazia G wrote: Medication:  albuterol  (VENTOLIN  HFA) 108 (90 Base) MCG/ACT inhaler  Has the patient contacted their pharmacy? Yes (Agent: If no, request that the patient contact the pharmacy for the refill. If patient does not wish to contact the pharmacy document the reason why and proceed with request.) (Agent: If yes, when and what did the pharmacy advise?)  This is the patient's preferred pharmacy:   [id number] - [place], [place] - [address] AT [facility] OF [address] [place] [address] Phone: [phone] Fax: [phone]  Is this the correct pharmacy for this prescription? Yes If no, delete pharmacy and type the correct one.   Has the prescription been filled recently? Yes  Is the patient out of the medication? Yes  Has the patient been seen for an appointment in the last year OR does the patient have an upcoming appointment? Yes  Can we respond through MyChart? Yes  Agent: Please be advised that Rx refills may take up to 3 business days. We ask that you follow-up with your pharmacy.

## 2023-09-25 ENCOUNTER — Other Ambulatory Visit: Payer: Self-pay

## 2023-09-25 ENCOUNTER — Other Ambulatory Visit (HOSPITAL_COMMUNITY): Payer: Self-pay

## 2023-09-26 ENCOUNTER — Other Ambulatory Visit: Payer: Self-pay

## 2023-09-26 MED ORDER — ALBUTEROL SULFATE HFA 108 (90 BASE) MCG/ACT IN AERS: 1.0000 | INHALATION_SPRAY | Freq: Four times a day (QID) | RESPIRATORY_TRACT | 3 refills | Status: DC | PRN

## 2023-09-27 ENCOUNTER — Other Ambulatory Visit: Payer: Self-pay

## 2023-09-29 ENCOUNTER — Observation Stay (HOSPITAL_BASED_OUTPATIENT_CLINIC_OR_DEPARTMENT_OTHER)
Admission: EM | Admit: 2023-09-29 | Discharge: 2023-09-30 | Disposition: A | Discharge status: Home / Self Care | Attending: Family Medicine | Admitting: Family Medicine

## 2023-09-29 ENCOUNTER — Other Ambulatory Visit: Payer: Self-pay

## 2023-09-29 ENCOUNTER — Emergency Department (HOSPITAL_BASED_OUTPATIENT_CLINIC_OR_DEPARTMENT_OTHER)

## 2023-09-29 ENCOUNTER — Encounter (HOSPITAL_BASED_OUTPATIENT_CLINIC_OR_DEPARTMENT_OTHER): Payer: Self-pay

## 2023-09-29 DIAGNOSIS — N1832 Chronic kidney disease, stage 3b: Secondary | ICD-10-CM | POA: Diagnosis not present

## 2023-09-29 DIAGNOSIS — I700 Atherosclerosis of aorta: Secondary | ICD-10-CM | POA: Insufficient documentation

## 2023-09-29 DIAGNOSIS — J449 Chronic obstructive pulmonary disease, unspecified: Secondary | ICD-10-CM | POA: Diagnosis not present

## 2023-09-29 DIAGNOSIS — Z7982 Long term (current) use of aspirin: Secondary | ICD-10-CM | POA: Diagnosis not present

## 2023-09-29 DIAGNOSIS — R7989 Other specified abnormal findings of blood chemistry: Secondary | ICD-10-CM | POA: Diagnosis not present

## 2023-09-29 DIAGNOSIS — R0603 Acute respiratory distress: Principal | ICD-10-CM

## 2023-09-29 DIAGNOSIS — J439 Emphysema, unspecified: Secondary | ICD-10-CM | POA: Diagnosis not present

## 2023-09-29 DIAGNOSIS — I129 Hypertensive chronic kidney disease with stage 1 through stage 4 chronic kidney disease, or unspecified chronic kidney disease: Secondary | ICD-10-CM | POA: Insufficient documentation

## 2023-09-29 DIAGNOSIS — Z1152 Encounter for screening for COVID-19: Secondary | ICD-10-CM | POA: Diagnosis not present

## 2023-09-29 DIAGNOSIS — I160 Hypertensive urgency: Secondary | ICD-10-CM | POA: Diagnosis present

## 2023-09-29 DIAGNOSIS — J441 Chronic obstructive pulmonary disease with (acute) exacerbation: Secondary | ICD-10-CM | POA: Diagnosis not present

## 2023-09-29 DIAGNOSIS — R0602 Shortness of breath: Secondary | ICD-10-CM | POA: Diagnosis not present

## 2023-09-29 DIAGNOSIS — I480 Paroxysmal atrial fibrillation: Secondary | ICD-10-CM | POA: Diagnosis not present

## 2023-09-29 LAB — URINALYSIS, ROUTINE W REFLEX MICROSCOPIC
Bacteria, UA: NONE SEEN
Bilirubin Urine: NEGATIVE
Glucose, UA: NEGATIVE mg/dL
Hgb urine dipstick: NEGATIVE
Ketones, ur: NEGATIVE mg/dL
Leukocytes,Ua: NEGATIVE
Nitrite: NEGATIVE
Protein, ur: NEGATIVE mg/dL
Specific Gravity, Urine: 1.005 (ref 1.005–1.030)
pH: 5.5 (ref 5.0–8.0)

## 2023-09-29 LAB — I-STAT VENOUS BLOOD GAS, ED
Acid-Base Excess: 0 mmol/L (ref 0.0–2.0)
Bicarbonate: 24.8 mmol/L (ref 20.0–28.0)
Calcium, Ion: 1.19 mmol/L (ref 1.15–1.40)
HCT: 33 % — ABNORMAL LOW (ref 39.0–52.0)
Hemoglobin: 11.2 g/dL — ABNORMAL LOW (ref 13.0–17.0)
O2 Saturation: 67 %
Patient temperature: 98.4
Potassium: 4.7 mmol/L (ref 3.5–5.1)
Sodium: 142 mmol/L (ref 135–145)
TCO2: 26 mmol/L (ref 22–32)
pCO2, Ven: 40.8 mmHg — ABNORMAL LOW (ref 44–60)
pH, Ven: 7.392 (ref 7.25–7.43)
pO2, Ven: 35 mmHg (ref 32–45)

## 2023-09-29 LAB — CBC WITH DIFFERENTIAL/PLATELET
Abs Immature Granulocytes: 0.04 K/uL (ref 0.00–0.07)
Basophils Absolute: 0 K/uL (ref 0.0–0.1)
Basophils Relative: 0 %
Eosinophils Absolute: 0.9 K/uL — ABNORMAL HIGH (ref 0.0–0.5)
Eosinophils Relative: 10 %
HCT: 32.7 % — ABNORMAL LOW (ref 39.0–52.0)
Hemoglobin: 11.3 g/dL — ABNORMAL LOW (ref 13.0–17.0)
Immature Granulocytes: 1 %
Lymphocytes Relative: 18 %
Lymphs Abs: 1.5 K/uL (ref 0.7–4.0)
MCH: 32.8 pg (ref 26.0–34.0)
MCHC: 34.6 g/dL (ref 30.0–36.0)
MCV: 95.1 fL (ref 80.0–100.0)
Monocytes Absolute: 0.7 K/uL (ref 0.1–1.0)
Monocytes Relative: 8 %
Neutro Abs: 5.4 K/uL (ref 1.7–7.7)
Neutrophils Relative %: 63 %
Platelets: 260 K/uL (ref 150–400)
RBC: 3.44 MIL/uL — ABNORMAL LOW (ref 4.22–5.81)
RDW: 13.9 % (ref 11.5–15.5)
WBC: 8.5 K/uL (ref 4.0–10.5)
nRBC: 0 % (ref 0.0–0.2)

## 2023-09-29 LAB — COMPREHENSIVE METABOLIC PANEL WITH GFR
ALT: 7 U/L (ref 0–44)
AST: 17 U/L (ref 15–41)
Albumin: 4.3 g/dL (ref 3.5–5.0)
Alkaline Phosphatase: 82 U/L (ref 38–126)
Anion gap: 11 (ref 5–15)
BUN: 22 mg/dL (ref 8–23)
CO2: 21 mmol/L — ABNORMAL LOW (ref 22–32)
Calcium: 8.9 mg/dL (ref 8.9–10.3)
Chloride: 109 mmol/L (ref 98–111)
Creatinine, Ser: 1.93 mg/dL — ABNORMAL HIGH (ref 0.61–1.24)
GFR, Estimated: 38 mL/min — ABNORMAL LOW (ref >60)
Glucose, Bld: 83 mg/dL (ref 70–99)
Potassium: 3.9 mmol/L (ref 3.5–5.1)
Sodium: 141 mmol/L (ref 135–145)
Total Bilirubin: 0.5 mg/dL (ref 0.0–1.2)
Total Protein: 7.4 g/dL (ref 6.5–8.1)

## 2023-09-29 LAB — RESP PANEL BY RT-PCR (RSV, FLU A&B, COVID)  RVPGX2
Influenza A by PCR: NEGATIVE
Influenza B by PCR: NEGATIVE
Resp Syncytial Virus by PCR: NEGATIVE
SARS Coronavirus 2 by RT PCR: NEGATIVE

## 2023-09-29 LAB — TROPONIN T, HIGH SENSITIVITY: Troponin T High Sensitivity: 32 ng/L — ABNORMAL HIGH (ref 0–19)

## 2023-09-29 LAB — PRO BRAIN NATRIURETIC PEPTIDE: Pro Brain Natriuretic Peptide: 450 pg/mL — ABNORMAL HIGH (ref <300.0)

## 2023-09-29 MED ORDER — LORAZEPAM 1 MG PO TABS
1.0000 mg | ORAL_TABLET | Freq: Once | ORAL | Status: AC
  Administered 2023-09-29: 1 mg via ORAL
  Filled 2023-09-29: qty 1

## 2023-09-29 MED ORDER — ALBUTEROL (5 MG/ML) CONTINUOUS INHALATION SOLN
10.0000 mg/h | INHALATION_SOLUTION | RESPIRATORY_TRACT | Status: AC
  Administered 2023-09-29: 10 mg/h via RESPIRATORY_TRACT

## 2023-09-29 MED ORDER — ALBUTEROL (5 MG/ML) CONTINUOUS INHALATION SOLN
INHALATION_SOLUTION | RESPIRATORY_TRACT | Status: AC
  Filled 2023-09-29: qty 20

## 2023-09-29 MED ORDER — IPRATROPIUM-ALBUTEROL 0.5-2.5 (3) MG/3ML IN SOLN
3.0000 mL | Freq: Once | RESPIRATORY_TRACT | Status: AC
  Administered 2023-09-29: 3 mL via RESPIRATORY_TRACT
  Filled 2023-09-29: qty 3

## 2023-09-29 MED ORDER — METHYLPREDNISOLONE SODIUM SUCC 125 MG IJ SOLR
125.0000 mg | Freq: Once | INTRAMUSCULAR | Status: AC
  Administered 2023-09-29: 125 mg via INTRAVENOUS
  Filled 2023-09-29: qty 2

## 2023-09-29 NOTE — ED Provider Notes (Signed)
  EMERGENCY DEPARTMENT AT [facility] Provider Note   CSN: [id number] Arrival date & time: 09/29/23  2037     Patient presents with: Respiratory Distress   Kevin [name] is a 68 y.o. male.   HPI     Patient presents with shortness of breath.  Patient states that has been having increasing shortness of breath over the past day.  Feels like is very consistent with previous episodes of COPD.  Patient states that he did not have an inhaler until yesterday.  Started using it but feels like is not really working  much.  Endorses some chest tightness.  No chest pain.  No exertional chest pain.  No cough.  No fever no chills.  No sick contacts he is aware of.  Previous medical history reviewed : Patient was last discharged on August 03, 2023.  Was seen because of hypertensive emergency.  COPD.   Prior to Admission medications   Medication Sig Start Date End Date Taking? Authorizing Provider  albuterol  (VENTOLIN  HFA) 108 (90 Base) MCG/ACT inhaler Inhale 1-2 puffs into the lungs every 6 (six) hours as needed for wheezing or shortness of breath. 09/26/23   Ruthell Lauraine FALCON, NP  amLODipine -valsartan  (EXFORGE ) 10-320 MG tablet Take 1 tablet by mouth daily. 04/19/23   Goodrich, Callie E, PA-C  aspirin  EC (ASPIRIN  LOW DOSE) 81 MG tablet Take 1 tablet (81 mg total) by mouth daily, swallow whole 05/07/23   [name], MD  Aspirin -Salicylamide-Caffeine (BC HEADACHE POWDER PO) Take 1 packet by mouth every 8 (eight) hours as needed (for headaches).    [provider]  atorvastatin  (LIPITOR) 80 MG tablet Take 1 tablet (80 mg total) by mouth daily. 07/18/23   Walker, Caitlin S, NP  baclofen (LIORESAL) 10 MG tablet Take 10 mg by mouth 3 (three) times daily.    [provider]  celecoxib (CELEBREX) 200 MG capsule Take 200 mg by mouth 2 (two) times daily as needed (for pain).    [provider]  ciclopirox  (PENLAC ) 8 % solution Apply topically at bedtime. Apply over  nail and surrounding skin. Apply daily over previous coat. After seven (7) days, may remove with alcohol and continue cycle. 07/03/23   Gershon Donnice SAUNDERS, DPM  dolutegravir -lamiVUDine  (DOVATO ) 50-300 MG tablet Take 1 tablet by mouth daily. 08/31/23   Manandhar, Sabina, MD  Fluticasone -Umeclidin-Vilant (TRELEGY ELLIPTA ) 200-62.5-25 MCG/ACT AEPB Inhale 1 puff into the lungs daily. 08/07/23   Neysa Reggy BIRCH, MD  hydrALAZINE  (APRESOLINE ) 25 MG tablet Take 1 tablet (25 mg total) by mouth 2 (two) times daily. 06/29/23 09/27/23  Walker, Caitlin S, NP  ipratropium-albuterol  (DUONEB) 0.5-2.5 (3) MG/3ML SOLN Take 3 mLs by nebulization every 6 (six) hours as needed. Patient taking differently: Take 3 mLs by nebulization every 6 (six) hours as needed (for shortness of breath or wheezing). 02/27/23   Neysa Reggy BIRCH, MD  montelukast  (SINGULAIR ) 10 MG tablet Take 1 tablet (10 mg total) by mouth at bedtime. 07/18/23   Cobb, Comer GAILS, NP  nebivolol  (BYSTOLIC ) 5 MG tablet Take 1 tablet (5 mg total) by mouth daily. 07/18/23   [name], NP  temazepam  (RESTORIL ) 30 MG capsule Take 1 capsule (30 mg total) by mouth at bedtime as needed for sleep. 08/07/23   Neysa Reggy D, MD  Vitamin D , Ergocalciferol , (DRISDOL ) 1.25 MG (50000 UNIT) CAPS capsule Take one capsule (50,000 Units total) by mouth once a week for 12 weeks, then recheck level in the office Patient taking  differently: Take 50,000 Units by mouth every Monday. 06/26/23       Allergies: Isosorbide  mononitrate [isosorbide  nitrate]    Review of Systems  Constitutional:  Negative for chills and fever.  HENT:  Negative for ear pain and sore throat.   Eyes:  Negative for pain and visual disturbance.  Respiratory:  Negative for cough and shortness of breath.   Cardiovascular:  Negative for chest pain and palpitations.  Gastrointestinal:  Negative for abdominal pain and vomiting.  Genitourinary:  Negative for dysuria and hematuria.  Musculoskeletal:  Negative  for arthralgias and back pain.  Skin:  Negative for color change and rash.  Neurological:  Negative for seizures and syncope.  All other systems reviewed and are negative.   Updated Vital Signs BP (!) 183/74   Pulse 81   Temp 98.9 F (37.2 C) (Oral)   Resp 20   Ht 6' (1.829 m)   Wt 72.6 kg   SpO2 100%   BMI 21.70 kg/m   Physical Exam Vitals and nursing note reviewed.  Constitutional:      General: He is not in acute distress.    Appearance: He is well-developed.  HENT:     Head: Normocephalic and atraumatic.  Eyes:     Conjunctiva/sclera: Conjunctivae normal.  Cardiovascular:     Rate and Rhythm: Normal rate and regular rhythm.     Heart sounds: No murmur heard. Pulmonary:     Effort: Respiratory distress present.     Breath sounds: Wheezing present.  Abdominal:     Palpations: Abdomen is soft.     Tenderness: There is no abdominal tenderness.  Musculoskeletal:        General: No swelling.     Cervical back: Neck supple.  Skin:    General: Skin is warm and dry.     Capillary Refill: Capillary refill takes less than 2 seconds.  Neurological:     Mental Status: He is alert.  Psychiatric:        Mood and Affect: Mood normal.     (all labs ordered are listed, but only abnormal results are displayed) Labs Reviewed  CBC WITH DIFFERENTIAL/PLATELET - Abnormal; Notable for the following components:      Result Value   RBC 3.44 (*)    Hemoglobin 11.3 (*)    HCT 32.7 (*)    Eosinophils Absolute 0.9 (*)    All other components within normal limits  COMPREHENSIVE METABOLIC PANEL WITH GFR - Abnormal; Notable for the following components:   CO2 21 (*)    Creatinine, Ser 1.93 (*)    GFR, Estimated 38 (*)    All other components within normal limits  PRO BRAIN NATRIURETIC PEPTIDE - Abnormal; Notable for the following components:   Pro Brain Natriuretic Peptide 450.0 (*)    All other components within normal limits  I-STAT VENOUS BLOOD GAS, ED - Abnormal; Notable for  the following components:   pCO2, Ven 40.8 (*)    HCT 33.0 (*)    Hemoglobin 11.2 (*)    All other components within normal limits  TROPONIN T, HIGH SENSITIVITY - Abnormal; Notable for the following components:   Troponin T High Sensitivity 32 (*)    All other components within normal limits  RESP PANEL BY RT-PCR (RSV, FLU A&B, COVID)  RVPGX2  URINALYSIS, ROUTINE W REFLEX MICROSCOPIC  TROPONIN T, HIGH SENSITIVITY    EKG: None  Radiology: [facility] Chest Port 1 View Result Date: 09/29/2023 CLINICAL DATA:  Shortness of breath, respiratory  distress, COPD EXAM: PORTABLE CHEST 1 VIEW COMPARISON:  06/11/2023 FINDINGS: The heart size and mediastinal contours are within normal limits. Both lungs are clear. The visualized skeletal structures are unremarkable. IMPRESSION: No active disease. Electronically Signed   By: Franky Crease M.D.   On: 09/29/2023 21:59     Procedures   Medications Ordered in the ED  albuterol  (PROVENTIL ,VENTOLIN ) solution continuous neb (10 mg/hr Nebulization New Bag/Given 09/29/23 2135)  methylPREDNISolone  sodium succinate (SOLU-MEDROL ) 125 mg/2 mL injection 125 mg (125 mg Intravenous Given 09/29/23 2122)  ipratropium-albuterol  (DUONEB) 0.5-2.5 (3) MG/3ML nebulizer solution 3 mL (3 mLs Nebulization Given 09/29/23 2107)  LORazepam  (ATIVAN ) tablet 1 mg (1 mg Oral Given 09/29/23 2144)                                    Medical Decision Making Amount and/or Complexity of Data Reviewed Labs: ordered. Radiology: ordered.  Risk Prescription drug management. Decision regarding hospitalization.    Previous medical history reviewed : Patient was last discharged on August 03, 2023.  Was seen because of hypertensive emergency.  COPD.   Upon exam, patient had increased work of breathing.  Tachypnea.  Respiratory distress.  Respiratory rate of high 30s when I first saw the patient.  Decreased air movement bilaterally.  No chest pain.   Initially started patient on a DuoNeb as  well as Solu-Medrol .  Subsequently because of increased work of breathing, started patient on on continuous albuterol  for an hour.  Seem to largely improve most of his symptoms.  During this time also obtain a VBG.  No significant hypercapnia.   Patient was hypertensive at times.  No evidence of any kind of flash pulmonary edema.  Do not think this is consistent for hypertensive emergency at this point time either.  No indication for nitroglycerin  at this point time.   No clear infectious etiology.  Patient is compliant with his HIV medication.  Neutrophil count here is appropriate.  Chest x-ray does not show any kind of obvious infiltrate.   Patient will be admitted in the setting of tachypnea and increased work of breathing.   Will obtain CTA of the chest.  Hospitalist will follow CTA of the chest.  Will rule out PE in the setting of tachypnea, tachycardia as well as prior cancer diagnosis.  I think my concern for this is lower given patient's presentation and physical exam and improvement with DuoNebs but do think it is appropriate thing to rule out this point time given patient's history.   No chest pain. Low concerns for acute ACS pathology.  EKG showed no acute changes compared to prior.  He did complain about some chest tightness.  Initial troponin 32.  Will repeat.          Final diagnoses:  Respiratory distress  COPD exacerbation [facility])    ED Discharge Orders     None          Simon Lavonia SAILOR, MD 09/29/23 2339

## 2023-09-29 NOTE — ED Triage Notes (Signed)
 Pt POV d/t resp distress - has severe case of COPD, CHF and lung Cancer - currently getting Radiation - last was in June - next one is next month.

## 2023-09-29 NOTE — ED Notes (Signed)
 ED TO INPATIENT HANDOFF REPORT  ED Nurse Name and Phone #:   S Name/Age/Gender Kevin [name] Search 68 y.o. male Room/Bed: DB002/DB002  Code Status   Code Status: Prior  Home/SNF/Other Home Patient oriented to: self, place, time, and situation Is this baseline? Yes   Triage Complete: Triage complete  Chief Complaint COPD with acute exacerbation (HCC) [J44.1]  Triage Note Pt POV d/t resp distress - has severe case of COPD, CHF and lung Cancer - currently getting Radiation - last was in June - next one is next month.     Allergies Allergies  Allergen Reactions   Isosorbide  Mononitrate [Isosorbide  Nitrate] Shortness Of Breath    Level of Care/Admitting Diagnosis ED Disposition     ED Disposition  Admit   Condition  --   Comment  Hospital Area: [facility] [[id number]]  Level of Care: Telemetry [5]  Admit to tele based on following criteria: Monitor QTC interval  May admit patient to [name] or [name] if equivalent level of care is available:: Yes  Interfacility transfer: Yes  Covid Evaluation: Asymptomatic - no recent exposure (last 10 days) testing not required  Diagnosis: COPD with acute exacerbation [facility]) [[id number]]  Admitting Physician: CHARLTON EVALENE RAMAN [[id number]]  Attending Physician: CHARLTON EVALENE RAMAN [[id number]]  Certification:: I certify this patient will need inpatient services for at least 2 midnights  Expected Medical Readiness: 10/01/2023          B Medical/Surgery History Past Medical History:  Diagnosis Date   AIDS (acquired immune deficiency syndrome) (HCC) 08/17/2016   Amaurosis fugax 08/18/2022   Chronic diastolic CHF (congestive heart failure), NYHA class 3 (HCC) 01/2016   Chronic lower back pain    CKD (chronic kidney disease), stage III (HCC)    stage 3b   COPD (chronic obstructive pulmonary disease) (HCC)    Dyspnea    Gout    forearms, hands, ankles, feet (06/05/2016)   Headache    weekly (06/05/2016)   Heart  murmur    never has caused any problems per pt   History of herpes zoster virus 01/2023   Hypertension    Hypertensive crisis 08/15/2017   Lipoma 07/06/2022   Neuromuscular disorder (HCC) 02/05/2023   post herpetic neuralgia    -  Primary   OSA on CPAP    does not use CPAP   PAD (peripheral artery disease) (HCC)    PAF (paroxysmal atrial fibrillation) (HCC) 01/2016   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   Pulmonary nodules 02/2023   Substance abuse (HCC)    in past   Past Surgical History:  Procedure Laterality Date   BRONCHIAL BIOPSY  06/11/2023   Procedure: BRONCHOSCOPY, WITH BIOPSY;  Surgeon: Shelah Lamar RAMAN, MD;  Location: [facility] ENDOSCOPY;  Service: Pulmonary;;   BRONCHIAL BRUSHINGS  06/11/2023   Procedure: BRONCHOSCOPY, WITH BRUSH BIOPSY;  Surgeon: Shelah Lamar RAMAN, MD;  Location: [facility] ENDOSCOPY;  Service: Pulmonary;;   BRONCHIAL NEEDLE ASPIRATION BIOPSY  06/11/2023   Procedure: BRONCHOSCOPY, WITH NEEDLE ASPIRATION BIOPSY;  Surgeon: Shelah Lamar RAMAN, MD;  Location: [facility] ENDOSCOPY;  Service: Pulmonary;;   BRONCHIAL WASHINGS  06/11/2023   Procedure: IRRIGATION, BRONCHUS;  Surgeon: Shelah Lamar RAMAN, MD;  Location: [facility] ENDOSCOPY;  Service: Pulmonary;;   COLONOSCOPY N/A 04/24/2023   Procedure: COLONOSCOPY;  Surgeon: Rosalie Kitchens, MD;  Location: [facility] ENDOSCOPY;  Service: Gastroenterology;  Laterality: N/A;   IR RADIOLOGIST EVAL & MGMT  05/31/2021   IR RADIOLOGIST EVAL & MGMT  07/26/2021  LEFT HEART CATH AND CORONARY ANGIOGRAPHY N/A 10/23/2016   Procedure: LEFT HEART CATH AND CORONARY ANGIOGRAPHY;  Surgeon: Anner Alm ORN, MD;  Location: [facility] INVASIVE CV LAB;  Service: Cardiovascular;  Laterality: N/A;   LOWER EXTREMITY ANGIOGRAPHY N/A 07/17/2016   Procedure: Lower Extremity Angiography;  Surgeon: Court Dorn PARAS, MD;  Location: [facility] INVASIVE CV LAB;  Service: Cardiovascular;  Laterality: N/A;   LOWER EXTREMITY INTERVENTION N/A 06/05/2016   Procedure: Lower Extremity Intervention;  Surgeon:  Dorn PARAS Court, MD;  Location: [facility] INVASIVE CV LAB;  Service: Cardiovascular;  Laterality: N/A;   PERIPHERAL VASCULAR ATHERECTOMY Right 07/17/2016   Procedure: Peripheral Vascular Atherectomy;  Surgeon: Court Dorn PARAS, MD;  Location: [facility] INVASIVE CV LAB;  Service: Cardiovascular;  Laterality: Right;  SFA   PERIPHERAL VASCULAR INTERVENTION  06/05/2016   Procedure: Peripheral Vascular Intervention;  Surgeon: Dorn PARAS Court, MD;  Location: [facility] INVASIVE CV LAB;  Service: Cardiovascular;;  left SFA   POLYPECTOMY  04/24/2023   Procedure: POLYPECTOMY;  Surgeon: Rosalie Kitchens, MD;  Location: [facility];  Service: Gastroenterology;;   RADIOLOGY WITH ANESTHESIA Right 06/29/2021   Procedure: RIGHT RENAL CRYOABLATION;  Surgeon: Philip Cornet, MD;  Location: [facility] ORS;  Service: Anesthesiology;  Laterality: Right;   VIDEO BRONCHOSCOPY WITH ENDOBRONCHIAL NAVIGATION Right 06/11/2023   Procedure: VIDEO BRONCHOSCOPY WITH ENDOBRONCHIAL NAVIGATION;  Surgeon: Shelah Lamar RAMAN, MD;  Location: [facility] ENDOSCOPY;  Service: Pulmonary;  Laterality: Right;  WITH FLUORO AND BIOPSY     A IV Location/Drains/Wounds Patient Lines/Drains/Airways Status     Active Line/Drains/Airways     Name Placement date Placement time Site Days   Peripheral IV 09/29/23 20 G 1 Right Antecubital 09/29/23  2102  Antecubital  less than 1            Intake/Output Last 24 hours No intake or output data in the 24 hours ending 09/29/23 2323  Labs/Imaging Results for orders placed or performed during the hospital encounter of 09/29/23 (from the past 48 hours)  Resp panel by RT-PCR (RSV, Flu A&B, Covid) Anterior Nasal Swab     Status: None   Collection Time: 09/29/23  9:14 PM   Specimen: Anterior Nasal Swab  Result Value Ref Range   SARS Coronavirus 2 by RT PCR NEGATIVE NEGATIVE    Comment: (NOTE) SARS-CoV-2 target nucleic acids are NOT DETECTED.  The SARS-CoV-2 RNA is generally detectable in upper respiratory specimens during the acute phase  of infection. The lowest concentration of SARS-CoV-2 viral copies this assay can detect is 138 copies/mL. A negative result does not preclude SARS-Cov-2 infection and should not be used as the sole basis for treatment or other patient management decisions. A negative result may occur with  improper specimen collection/handling, submission of specimen other than nasopharyngeal swab, presence of viral mutation(s) within the areas targeted by this assay, and inadequate number of viral copies(<138 copies/mL). A negative result must be combined with clinical observations, patient history, and epidemiological information. The expected result is Negative.  Fact Sheet for Patients:  BloggerCourse.com  Fact Sheet for Healthcare Providers:  SeriousBroker.it  This test is no t yet approved or cleared by the [place]  FDA and  has been authorized for detection and/or diagnosis of SARS-CoV-2 by FDA under an Emergency Use Authorization (EUA). This EUA will remain  in effect (meaning this test can be used) for the duration of the COVID-19 declaration under Section 564(b)(1) of the Act, 21 U.S.C.section 360bbb-3(b)(1), unless the authorization is terminated  or revoked sooner.  Influenza A by PCR NEGATIVE NEGATIVE   Influenza B by PCR NEGATIVE NEGATIVE    Comment: (NOTE) The Xpert Xpress SARS-CoV-2/FLU/RSV plus assay is intended as an aid in the diagnosis of influenza from Nasopharyngeal swab specimens and should not be used as a sole basis for treatment. Nasal washings and aspirates are unacceptable for Xpert Xpress SARS-CoV-2/FLU/RSV testing.  Fact Sheet for Patients: BloggerCourse.com  Fact Sheet for Healthcare Providers: SeriousBroker.it  This test is not yet approved or cleared by the [place]  FDA and has been authorized for detection and/or diagnosis of SARS-CoV-2 by FDA  under an Emergency Use Authorization (EUA). This EUA will remain in effect (meaning this test can be used) for the duration of the COVID-19 declaration under Section 564(b)(1) of the Act, 21 U.S.C. section 360bbb-3(b)(1), unless the authorization is terminated or revoked.     Resp Syncytial Virus by PCR NEGATIVE NEGATIVE    Comment: (NOTE) Fact Sheet for Patients: BloggerCourse.com  Fact Sheet for Healthcare Providers: SeriousBroker.it  This test is not yet approved or cleared by the [place]  FDA and has been authorized for detection and/or diagnosis of SARS-CoV-2 by FDA under an Emergency Use Authorization (EUA). This EUA will remain in effect (meaning this test can be used) for the duration of the COVID-19 declaration under Section 564(b)(1) of the Act, 21 U.S.C. section 360bbb-3(b)(1), unless the authorization is terminated or revoked.  Performed at [facility], [address] [place], [place] [address]   CBC with Differential/Platelet     Status: Abnormal   Collection Time: 09/29/23  9:14 PM  Result Value Ref Range   WBC 8.5 4.0 - 10.5 K/uL   RBC 3.44 (L) 4.22 - 5.81 MIL/uL   Hemoglobin 11.3 (L) 13.0 - 17.0 g/dL   HCT 67.2 (L) 60.9 - 47.9 %   MCV 95.1 80.0 - 100.0 fL   MCH 32.8 26.0 - 34.0 pg   MCHC 34.6 30.0 - 36.0 g/dL   RDW 86.0 88.4 - 84.4 %   Platelets 260 150 - 400 K/uL   nRBC 0.0 0.0 - 0.2 %   Neutrophils Relative % 63 %   Neutro Abs 5.4 1.7 - 7.7 K/uL   Lymphocytes Relative 18 %   Lymphs Abs 1.5 0.7 - 4.0 K/uL   Monocytes Relative 8 %   Monocytes Absolute 0.7 0.1 - 1.0 K/uL   Eosinophils Relative 10 %   Eosinophils Absolute 0.9 (H) 0.0 - 0.5 K/uL   Basophils Relative 0 %   Basophils Absolute 0.0 0.0 - 0.1 K/uL   Immature Granulocytes 1 %   Abs Immature Granulocytes 0.04 0.00 - 0.07 K/uL    Comment: Performed at [facility], [address] [place],  [place] [address]  Comprehensive metabolic panel     Status: Abnormal   Collection Time: 09/29/23  9:14 PM  Result Value Ref Range   Sodium 141 135 - 145 mmol/L   Potassium 3.9 3.5 - 5.1 mmol/L   Chloride 109 98 - 111 mmol/L   CO2 21 (L) 22 - 32 mmol/L   Glucose, Bld 83 70 - 99 mg/dL    Comment: Glucose reference range applies only to samples taken after fasting for at least 8 hours.   BUN 22 8 - 23 mg/dL   Creatinine, Ser 8.06 (H) 0.61 - 1.24 mg/dL   Calcium  8.9 8.9 - 10.3 mg/dL   Total Protein 7.4 6.5 - 8.1 g/dL   Albumin  4.3 3.5 - 5.0 g/dL   AST 17  15 - 41 U/L   ALT 7 0 - 44 U/L   Alkaline Phosphatase 82 38 - 126 U/L   Total Bilirubin 0.5 0.0 - 1.2 mg/dL   GFR, Estimated 38 (L) >60 mL/min    Comment: (NOTE) Calculated using the CKD-EPI Creatinine Equation (2021)    Anion gap 11 5 - 15    Comment: Performed at [facility], [address] [place], [place] [address]  Brain natriuretic peptide     Status: Abnormal   Collection Time: 09/29/23  9:14 PM  Result Value Ref Range   Pro Brain Natriuretic Peptide 450.0 (H) <300.0 pg/mL    Comment: (NOTE) Age Group        Cut-Points    Interpretation  < 50 years     450 pg/mL       NT-proBNP > 450 pg/mL indicates                                ADHF is likely              50 to 75 years  900 pg/mL      NT-proBNP > 900 pg/mL indicates          ADHF is likely  > 75 years      1800 pg/mL     NT-proBNP > 1800 pg/mL indicates          ADHF is likely                           All ages    Results between       Indeterminate. Further clinical             300 and the cut-   information is needed to determine            point for age group   if ADHF is present.                                                             Elecsys proBNP II/ Elecsys proBNP II STAT           Cut-Point                       Interpretation  300 pg/mL                    NT-proBNP <300pg/mL indicates                             ADHF is not  likely  Performed at [facility], [address] [place], [place] [address]   Urinalysis, Routine w reflex microscopic -Urine, Clean Catch     Status: None   Collection Time: 09/29/23  9:14 PM  Result Value Ref Range   Color, Urine YELLOW YELLOW   APPearance CLEAR CLEAR   Specific Gravity, Urine 1.005 1.005 - 1.030   pH 5.5 5.0 - 8.0   Glucose, UA NEGATIVE NEGATIVE mg/dL   Hgb urine dipstick NEGATIVE NEGATIVE   Bilirubin Urine NEGATIVE NEGATIVE   Ketones, ur NEGATIVE NEGATIVE mg/dL   Protein,  ur NEGATIVE NEGATIVE mg/dL   Nitrite NEGATIVE NEGATIVE   Leukocytes,Ua NEGATIVE NEGATIVE   RBC / HPF 0-5 0 - 5 RBC/hpf   WBC, UA 0-5 0 - 5 WBC/hpf   Bacteria, UA NONE SEEN NONE SEEN   Squamous Epithelial / HPF 0-5 0 - 5 /HPF    Comment: Performed at [facility], [address], [place], [place] [address]  Troponin T, High Sensitivity     Status: Abnormal   Collection Time: 09/29/23  9:14 PM  Result Value Ref Range   Troponin T High Sensitivity 32 (H) 0 - 19 ng/L    Comment: (NOTE) Biotin concentrations > 1000 ng/mL falsely decrease TnT results.  Serial cardiac troponin measurements are suggested.  Refer to the Links section for chest pain algorithms and additional  guidance. Performed at [facility], [address] [place], [place] [address]   I-Stat venous blood gas, [facility], [facility], [facility])     Status: Abnormal   Collection Time: 09/29/23  9:20 PM  Result Value Ref Range   pH, Ven 7.392 7.25 - 7.43   pCO2, Ven 40.8 (L) 44 - 60 mmHg   pO2, Ven 35 32 - 45 mmHg   Bicarbonate 24.8 20.0 - 28.0 mmol/L   TCO2 26 22 - 32 mmol/L   O2 Saturation 67 %   Acid-Base Excess 0.0 0.0 - 2.0 mmol/L   Sodium 142 135 - 145 mmol/L   Potassium 4.7 3.5 - 5.1 mmol/L   Calcium , Ion 1.19 1.15 - 1.40 mmol/L   HCT 33.0 (L) 39.0 - 52.0 %   Hemoglobin 11.2 (L) 13.0 - 17.0 g/dL   Patient temperature 01.5 F    Collection site IV start    Drawn by  Nurse    Sample type VENOUS    Comment NOTIFIED PHYSICIAN    DG Chest Port 1 View Result Date: 09/29/2023 CLINICAL DATA:  Shortness of breath, respiratory distress, COPD EXAM: PORTABLE CHEST 1 VIEW COMPARISON:  06/11/2023 FINDINGS: The heart size and mediastinal contours are within normal limits. Both lungs are clear. The visualized skeletal structures are unremarkable. IMPRESSION: No active disease. Electronically Signed   By: Franky Crease M.D.   On: 09/29/2023 21:59    Pending Labs Unresulted Labs (From admission, onward)    None       Vitals/Pain Today's Vitals   09/29/23 2137 09/29/23 2200 09/29/23 2215 09/29/23 2230  BP:  (!) 198/95 (!) 165/86 (!) 183/74  Pulse:  89 78 83  Resp:  (!) 25 20 14   Temp:      TempSrc:      SpO2: 100% 100% 100% 100%  Weight:      Height:      PainSc:        Isolation Precautions No active isolations  Medications Medications  albuterol  (PROVENTIL ,VENTOLIN ) solution continuous neb (10 mg/hr Nebulization New Bag/Given 09/29/23 2135)  methylPREDNISolone  sodium succinate (SOLU-MEDROL ) 125 mg/2 mL injection 125 mg (125 mg Intravenous Given 09/29/23 2122)  ipratropium-albuterol  (DUONEB) 0.5-2.5 (3) MG/3ML nebulizer solution 3 mL (3 mLs Nebulization Given 09/29/23 2107)  LORazepam  (ATIVAN ) tablet 1 mg (1 mg Oral Given 09/29/23 2144)    Mobility      Focused Assessments    R Recommendations: See Admitting Provider Note  Report given to:   Additional Notes:

## 2023-09-29 NOTE — Progress Notes (Signed)
 Plan of Care Note for accepted transfer   Patient: Kevin [name] MRN: [id number]   DOA: 09/29/2023  Facility requesting transfer: [facility]   Requesting Provider: Dr. Simon   Reason for transfer: COPD exacerbation   Facility course: 68 yr old man with HIV, HTN, HLD, CKD 3B, COPD, PAF, HFpEF, PAF, and lung cancer presenting with SOB.   Pro-BNP is 450, troponin is 32, and WBC is normal. There are no acute findings on CXR.   He was treated with IV steroids, continuous albuterol  treatment, and DuoNeb.   Plan of care: The patient is accepted for admission to Telemetry unit, at [facility].   Author: Evalene GORMAN Sprinkles, MD 09/29/2023  Check [email or web address] for on-call coverage.  Nursing staff, Please call [facility] Admits & Consults System-Wide number on Amion as soon as patient's arrival, so appropriate admitting provider can evaluate the pt.

## 2023-09-30 DIAGNOSIS — Z1152 Encounter for screening for COVID-19: Secondary | ICD-10-CM | POA: Diagnosis not present

## 2023-09-30 DIAGNOSIS — I160 Hypertensive urgency: Secondary | ICD-10-CM | POA: Diagnosis not present

## 2023-09-30 DIAGNOSIS — I129 Hypertensive chronic kidney disease with stage 1 through stage 4 chronic kidney disease, or unspecified chronic kidney disease: Secondary | ICD-10-CM | POA: Diagnosis not present

## 2023-09-30 DIAGNOSIS — J439 Emphysema, unspecified: Secondary | ICD-10-CM | POA: Diagnosis not present

## 2023-09-30 DIAGNOSIS — R7989 Other specified abnormal findings of blood chemistry: Secondary | ICD-10-CM

## 2023-09-30 DIAGNOSIS — J441 Chronic obstructive pulmonary disease with (acute) exacerbation: Secondary | ICD-10-CM

## 2023-09-30 DIAGNOSIS — N1832 Chronic kidney disease, stage 3b: Secondary | ICD-10-CM | POA: Diagnosis not present

## 2023-09-30 DIAGNOSIS — I480 Paroxysmal atrial fibrillation: Secondary | ICD-10-CM | POA: Diagnosis not present

## 2023-09-30 DIAGNOSIS — Z7982 Long term (current) use of aspirin: Secondary | ICD-10-CM | POA: Diagnosis not present

## 2023-09-30 DIAGNOSIS — I700 Atherosclerosis of aorta: Secondary | ICD-10-CM | POA: Diagnosis not present

## 2023-09-30 DIAGNOSIS — R0602 Shortness of breath: Secondary | ICD-10-CM | POA: Diagnosis not present

## 2023-09-30 DIAGNOSIS — R918 Other nonspecific abnormal finding of lung field: Secondary | ICD-10-CM | POA: Diagnosis not present

## 2023-09-30 LAB — TROPONIN I (HIGH SENSITIVITY): Troponin I (High Sensitivity): 25 ng/L — ABNORMAL HIGH (ref <18)

## 2023-09-30 MED ORDER — ALBUTEROL SULFATE HFA 108 (90 BASE) MCG/ACT IN AERS: 1.0000 | INHALATION_SPRAY | Freq: Four times a day (QID) | RESPIRATORY_TRACT | 3 refills | Status: DC | PRN

## 2023-09-30 MED ORDER — ENOXAPARIN SODIUM 40 MG/0.4ML IJ SOSY
40.0000 mg | PREFILLED_SYRINGE | INTRAMUSCULAR | Status: DC
  Administered 2023-09-30: 40 mg via SUBCUTANEOUS
  Filled 2023-09-30: qty 0.4

## 2023-09-30 MED ORDER — BUDESON-GLYCOPYRROL-FORMOTEROL 160-9-4.8 MCG/ACT IN AERO
2.0000 | INHALATION_SPRAY | Freq: Two times a day (BID) | RESPIRATORY_TRACT | Status: DC
  Administered 2023-09-30: 2 via RESPIRATORY_TRACT
  Filled 2023-09-30: qty 5.9

## 2023-09-30 MED ORDER — TRELEGY ELLIPTA 200-62.5-25 MCG/ACT IN AEPB
1.0000 | INHALATION_SPRAY | Freq: Every day | RESPIRATORY_TRACT | 2 refills | Status: DC
Start: 2023-09-30 — End: 2023-09-30
  Filled 2023-09-30: qty 60, 30d supply, fill #0

## 2023-09-30 MED ORDER — ACETAMINOPHEN 325 MG PO TABS: 650.0000 mg | ORAL_TABLET | Freq: Four times a day (QID) | ORAL | Status: DC | PRN

## 2023-09-30 MED ORDER — PREDNISONE 20 MG PO TABS
40.0000 mg | ORAL_TABLET | Freq: Every day | ORAL | Status: DC
  Administered 2023-09-30: 40 mg via ORAL
  Filled 2023-09-30: qty 2

## 2023-09-30 MED ORDER — ACETAMINOPHEN 650 MG RE SUPP: 650.0000 mg | Freq: Four times a day (QID) | RECTAL | Status: DC | PRN

## 2023-09-30 MED ORDER — PREDNISONE 10 MG PO TABS
ORAL_TABLET | ORAL | 0 refills | Status: DC
  Filled 2023-09-30: qty 21, 9d supply, fill #0

## 2023-09-30 MED ORDER — ALBUTEROL SULFATE HFA 108 (90 BASE) MCG/ACT IN AERS: 2.0000 | INHALATION_SPRAY | RESPIRATORY_TRACT | Status: DC | PRN

## 2023-09-30 MED ORDER — HYDRALAZINE HCL 20 MG/ML IJ SOLN: 5.0000 mg | INTRAMUSCULAR | Status: DC | PRN

## 2023-09-30 MED ORDER — PREDNISONE 10 MG PO TABS: ORAL_TABLET | ORAL | 0 refills | Status: AC

## 2023-09-30 MED ORDER — ALBUTEROL SULFATE HFA 108 (90 BASE) MCG/ACT IN AERS
1.0000 | INHALATION_SPRAY | Freq: Four times a day (QID) | RESPIRATORY_TRACT | 3 refills | Status: DC | PRN
  Filled 2023-09-30: qty 6.7, 25d supply, fill #0

## 2023-09-30 MED ORDER — IOHEXOL 350 MG/ML SOLN
65.0000 mL | Freq: Once | INTRAVENOUS | Status: AC | PRN
  Administered 2023-09-30: 65 mL via INTRAVENOUS

## 2023-09-30 MED ORDER — ALBUTEROL SULFATE HFA 108 (90 BASE) MCG/ACT IN AERS
2.0000 | INHALATION_SPRAY | RESPIRATORY_TRACT | Status: DC | PRN
Start: 2023-09-30 — End: 2023-09-30
  Administered 2023-09-30: 2 via RESPIRATORY_TRACT
  Filled 2023-09-30: qty 6.7

## 2023-09-30 MED ORDER — ALBUTEROL SULFATE (2.5 MG/3ML) 0.083% IN NEBU: 2.5000 mg | INHALATION_SOLUTION | Freq: Four times a day (QID) | RESPIRATORY_TRACT | Status: DC | PRN

## 2023-09-30 MED ORDER — IPRATROPIUM-ALBUTEROL 0.5-2.5 (3) MG/3ML IN SOLN: 3.0000 mL | Freq: Four times a day (QID) | RESPIRATORY_TRACT | Status: DC | PRN

## 2023-09-30 MED ORDER — TRELEGY ELLIPTA 200-62.5-25 MCG/ACT IN AEPB: 1.0000 | INHALATION_SPRAY | Freq: Every day | RESPIRATORY_TRACT | 2 refills | Status: AC

## 2023-09-30 MED ORDER — NICOTINE 14 MG/24HR TD PT24
14.0000 mg | MEDICATED_PATCH | Freq: Every day | TRANSDERMAL | Status: DC
  Administered 2023-09-30: 14 mg via TRANSDERMAL
  Filled 2023-09-30: qty 1

## 2023-09-30 NOTE — Progress Notes (Signed)
 Patient given discharge, medication, and follow up instructions, verbalized understanding, IV and telemetry monitor removed, personal belongings with patient, family to transport home

## 2023-09-30 NOTE — Plan of Care (Signed)
  Problem: Health Behavior/Discharge Planning: Goal: Ability to manage health-related needs will improve Outcome: Progressing   Problem: Clinical Measurements: Goal: Respiratory complications will improve Outcome: Progressing   Problem: Coping: Goal: Level of anxiety will decrease Outcome: Progressing   Problem: Pain Managment: Goal: General experience of comfort will improve and/or be controlled Outcome: Progressing

## 2023-09-30 NOTE — H&P (Signed)
 History and Physical    Kevin [name] FMW:[id number] DOB: 1955/06/30 DOA: 09/29/2023  PCP: Kevin Beverley NOVAK, MD  Patient coming from: [facility] ED  Chief Complaint: Shortness of breath  HPI: Kevin [name] is a 68 y.o. male with medical history significant of non-small cell lung cancer status post radiation, history of renal cell carcinoma in 2023, HIV, hypertension, hyperlipidemia, CKD stage IIIb, COPD, CAD, paroxysmal A-fib not on anticoagulation, HFpEF, TIA, gout, OSA on CPAP, PAD, anxiety, insomnia, tobacco abuse.  He was admitted to the hospital 6/18-6/20/2025 for hypertensive emergency presumed secondary to noncompliance.  Patient is presenting with a chief complaint of shortness of breath.  He is reporting 2-day history of shortness of breath, nonproductive cough, and chest tightness.  He ran out of his home inhaler Trelegy 3 or 4 months ago and currently only using albuterol  inhaler as needed.  Chest tightness has resolved after he received bronchodilator treatments in the ED.  He reports taking 3 different medications for hypertension at home and denies missing any doses.  Denies headaches, nausea, vomiting, or abdominal pain.  He reports smoking 1/2 pack of cigarettes daily since the age of 65.  ED Course: Oxygen saturation in the high 90s on room air on arrival but patient had increased work of breathing.  Tachypneic to the 30s initially and was placed on 2 L [place] for comfort.  Hypertensive with SBP up to 190s.  Oxygen saturation recorded in the high 90s on room air on arrival.  Labs showing no leukocytosis, hemoglobin 11.3 (stable), MCV 95.1, bicarb 21, creatinine 1.9 (improved), COVID/influenza/RSV PCR negative, pro BNP 450, troponin 32, VBG showing pH 7.39 and pCO2 40.8.  CTA chest showing no PE or acute airspace disease.  No EKG done.  Patient was given DuoNeb, Ativan , Solu-Medrol  125 mg, and continuous albuterol  neb treatment.  Review of Systems:  Review of Systems  All other systems  reviewed and are negative.   Past Medical History:  Diagnosis Date   AIDS (acquired immune deficiency syndrome) (HCC) 08/17/2016   Amaurosis fugax 08/18/2022   Chronic diastolic CHF (congestive heart failure), NYHA class 3 (HCC) 01/2016   Chronic lower back pain    CKD (chronic kidney disease), stage III (HCC)    stage 3b   COPD (chronic obstructive pulmonary disease) (HCC)    Dyspnea    Gout    forearms, hands, ankles, feet (06/05/2016)   Headache    weekly (06/05/2016)   Heart murmur    never has caused any problems per pt   History of herpes zoster virus 01/2023   Hypertension    Hypertensive crisis 08/15/2017   Lipoma 07/06/2022   Neuromuscular disorder (HCC) 02/05/2023   post herpetic neuralgia    -  Primary   OSA on CPAP    does not use CPAP   PAD (peripheral artery disease) (HCC)    PAF (paroxysmal atrial fibrillation) (HCC) 01/2016   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   Pulmonary nodules 02/2023   Substance abuse (HCC)    in past    Past Surgical History:  Procedure Laterality Date   BRONCHIAL BIOPSY  06/11/2023   Procedure: BRONCHOSCOPY, WITH BIOPSY;  Surgeon: Shelah Lamar RAMAN, MD;  Location: [facility] ENDOSCOPY;  Service: Pulmonary;;   BRONCHIAL BRUSHINGS  06/11/2023   Procedure: BRONCHOSCOPY, WITH BRUSH BIOPSY;  Surgeon: Shelah Lamar RAMAN, MD;  Location: [facility] ENDOSCOPY;  Service: Pulmonary;;   BRONCHIAL NEEDLE ASPIRATION BIOPSY  06/11/2023   Procedure: BRONCHOSCOPY, WITH NEEDLE ASPIRATION BIOPSY;  Surgeon: Shelah Lamar RAMAN, MD;  Location: [facility] ENDOSCOPY;  Service: Pulmonary;;   BRONCHIAL WASHINGS  06/11/2023   Procedure: GAYNELL MANI;  Surgeon: Shelah Lamar RAMAN, MD;  Location: [facility] ENDOSCOPY;  Service: Pulmonary;;   COLONOSCOPY N/A 04/24/2023   Procedure: COLONOSCOPY;  Surgeon: Rosalie Kitchens, MD;  Location: [facility] ENDOSCOPY;  Service: Gastroenterology;  Laterality: N/A;   IR RADIOLOGIST EVAL & MGMT  05/31/2021   IR RADIOLOGIST EVAL & MGMT  07/26/2021   LEFT HEART  CATH AND CORONARY ANGIOGRAPHY N/A 10/23/2016   Procedure: LEFT HEART CATH AND CORONARY ANGIOGRAPHY;  Surgeon: Anner Alm ORN, MD;  Location: [facility] INVASIVE CV LAB;  Service: Cardiovascular;  Laterality: N/A;   LOWER EXTREMITY ANGIOGRAPHY N/A 07/17/2016   Procedure: Lower Extremity Angiography;  Surgeon: Court Dorn PARAS, MD;  Location: [facility] INVASIVE CV LAB;  Service: Cardiovascular;  Laterality: N/A;   LOWER EXTREMITY INTERVENTION N/A 06/05/2016   Procedure: Lower Extremity Intervention;  Surgeon: Dorn PARAS Court, MD;  Location: [facility] INVASIVE CV LAB;  Service: Cardiovascular;  Laterality: N/A;   PERIPHERAL VASCULAR ATHERECTOMY Right 07/17/2016   Procedure: Peripheral Vascular Atherectomy;  Surgeon: Court Dorn PARAS, MD;  Location: [facility] INVASIVE CV LAB;  Service: Cardiovascular;  Laterality: Right;  SFA   PERIPHERAL VASCULAR INTERVENTION  06/05/2016   Procedure: Peripheral Vascular Intervention;  Surgeon: Dorn PARAS Court, MD;  Location: [facility] INVASIVE CV LAB;  Service: Cardiovascular;;  left SFA   POLYPECTOMY  04/24/2023   Procedure: POLYPECTOMY;  Surgeon: Rosalie Kitchens, MD;  Location: [facility] ENDOSCOPY;  Service: Gastroenterology;;   RADIOLOGY WITH ANESTHESIA Right 06/29/2021   Procedure: RIGHT RENAL CRYOABLATION;  Surgeon: Kevin Cornet, MD;  Location: [facility] ORS;  Service: Anesthesiology;  Laterality: Right;   VIDEO BRONCHOSCOPY WITH ENDOBRONCHIAL NAVIGATION Right 06/11/2023   Procedure: VIDEO BRONCHOSCOPY WITH ENDOBRONCHIAL NAVIGATION;  Surgeon: Shelah Lamar RAMAN, MD;  Location: [facility] ENDOSCOPY;  Service: Pulmonary;  Laterality: Right;  WITH FLUORO AND BIOPSY     reports that he has been smoking cigarettes. He has a 4.2 pack-year smoking history. He has never been exposed to tobacco smoke. He has never used smokeless tobacco. He reports that he does not currently use alcohol after a past usage of about 2.0 standard drinks of alcohol per week. He reports that he does not currently use drugs.  Allergies  Allergen Reactions    Isosorbide  Mononitrate [Isosorbide  Nitrate] Shortness Of Breath    Family History  Problem Relation Age of Onset   High blood pressure Mother    Lupus Mother    Seizures Daughter    Heart attack Daughter     Prior to Admission medications   Medication Sig Start Date End Date Taking? Authorizing Provider  albuterol  (VENTOLIN  HFA) 108 (90 Base) MCG/ACT inhaler Inhale 1-2 puffs into the lungs every 6 (six) hours as needed for wheezing or shortness of breath. 09/26/23   Ruthell Lauraine FALCON, NP  amLODipine -valsartan  (EXFORGE ) 10-320 MG tablet Take 1 tablet by mouth daily. 04/19/23   Goodrich, Callie E, PA-C  aspirin  EC (ASPIRIN  LOW DOSE) 81 MG tablet Take 1 tablet (81 mg total) by mouth daily, swallow whole 05/07/23   Kevin Beverley NOVAK, MD  Aspirin -Salicylamide-Caffeine (BC HEADACHE POWDER PO) Take 1 packet by mouth every 8 (eight) hours as needed (for headaches).    [provider]  atorvastatin  (LIPITOR) 80 MG tablet Take 1 tablet (80 mg total) by mouth daily. 07/18/23   [name], NP  baclofen (LIORESAL) 10 MG tablet Take 10 mg by mouth 3 (three) times  daily.    [provider]  celecoxib (CELEBREX) 200 MG capsule Take 200 mg by mouth 2 (two) times daily as needed (for pain).    [provider]  ciclopirox  (PENLAC ) 8 % solution Apply topically at bedtime. Apply over nail and surrounding skin. Apply daily over previous coat. After seven (7) days, may remove with alcohol and continue cycle. 07/03/23   Gershon Donnice SAUNDERS, DPM  dolutegravir -lamiVUDine  (DOVATO ) 50-300 MG tablet Take 1 tablet by mouth daily. 08/31/23   Manandhar, Sabina, MD  Fluticasone -Umeclidin-Vilant (TRELEGY ELLIPTA ) 200-62.5-25 MCG/ACT AEPB Inhale 1 puff into the lungs daily. 08/07/23   Neysa Reggy BIRCH, MD  hydrALAZINE  (APRESOLINE ) 25 MG tablet Take 1 tablet (25 mg total) by mouth 2 (two) times daily. 06/29/23 09/27/23  Walker, Caitlin S, NP  ipratropium-albuterol  (DUONEB) 0.5-2.5 (3) MG/3ML SOLN Take 3 mLs  by nebulization every 6 (six) hours as needed. Patient taking differently: Take 3 mLs by nebulization every 6 (six) hours as needed (for shortness of breath or wheezing). 02/27/23   Neysa Reggy BIRCH, MD  montelukast  (SINGULAIR ) 10 MG tablet Take 1 tablet (10 mg total) by mouth at bedtime. 07/18/23   Cobb, Comer GAILS, NP  nebivolol  (BYSTOLIC ) 5 MG tablet Take 1 tablet (5 mg total) by mouth daily. 07/18/23   [name], NP  temazepam  (RESTORIL ) 30 MG capsule Take 1 capsule (30 mg total) by mouth at bedtime as needed for sleep. 08/07/23   Neysa Reggy D, MD  Vitamin D , Ergocalciferol , (DRISDOL ) 1.25 MG (50000 UNIT) CAPS capsule Take one capsule (50,000 Units total) by mouth once a week for 12 weeks, then recheck level in the office Patient taking differently: Take 50,000 Units by mouth every Monday. 06/26/23       Physical Exam: Vitals:   09/29/23 2230 09/29/23 2315 09/29/23 2345 09/30/23 0026  BP: (!) 183/74   (!) 171/89  Pulse: 83 81 99 85  Resp: 14 20 14 16   Temp:    98.3 F (36.8 C)  TempSrc:    Oral  SpO2: 100% 100% 99% 100%  Weight:      Height:        Physical Exam Vitals reviewed.  Constitutional:      General: He is not in acute distress. HENT:     Head: Normocephalic and atraumatic.  Eyes:     Extraocular Movements: Extraocular movements intact.  Cardiovascular:     Rate and Rhythm: Normal rate and regular rhythm.     Pulses: Normal pulses.  Pulmonary:     Effort: Pulmonary effort is normal. No respiratory distress.     Breath sounds: No wheezing or rales.  Abdominal:     General: Bowel sounds are normal. There is no distension.     Palpations: Abdomen is soft.     Tenderness: There is no abdominal tenderness. There is no guarding.  Musculoskeletal:     Cervical back: Normal range of motion.     Right lower leg: No edema.     Left lower leg: No edema.  Skin:    General: Skin is warm and dry.  Neurological:     General: No focal deficit present.     Mental  Status: He is alert and oriented to person, place, and time.     Labs on Admission: I have personally reviewed following labs and imaging studies  CBC: Recent Labs  Lab 09/29/23 2114 09/29/23 2120  WBC 8.5  --   NEUTROABS 5.4  --   HGB 11.3*  11.2*  HCT 32.7* 33.0*  MCV 95.1  --   PLT 260  --    Basic Metabolic Panel: Recent Labs  Lab 09/29/23 2114 09/29/23 2120  NA 141 142  K 3.9 4.7  CL 109  --   CO2 21*  --   GLUCOSE 83  --   BUN 22  --   CREATININE 1.93*  --   CALCIUM  8.9  --    GFR: Estimated Creatinine Clearance: 39.2 mL/min (A) (by C-G formula based on SCr of 1.93 mg/dL (H)). Liver Function Tests: Recent Labs  Lab 09/29/23 2114  AST 17  ALT 7  ALKPHOS 82  BILITOT 0.5  PROT 7.4  ALBUMIN  4.3   No results for input(s): LIPASE, AMYLASE in the last 168 hours. No results for input(s): AMMONIA in the last 168 hours. Coagulation Profile: No results for input(s): INR, PROTIME in the last 168 hours. Cardiac Enzymes: No results for input(s): CKTOTAL, CKMB, CKMBINDEX, TROPONINI in the last 168 hours. BNP (last 3 results) Recent Labs    09/29/23 2114  PROBNP 450.0*   HbA1C: No results for input(s): HGBA1C in the last 72 hours. CBG: No results for input(s): GLUCAP in the last 168 hours. Lipid Profile: No results for input(s): CHOL, HDL, LDLCALC, TRIG, CHOLHDL, LDLDIRECT in the last 72 hours. Thyroid  Function Tests: No results for input(s): TSH, T4TOTAL, FREET4, T3FREE, THYROIDAB in the last 72 hours. Anemia Panel: No results for input(s): VITAMINB12, FOLATE, FERRITIN, TIBC, IRON, RETICCTPCT in the last 72 hours. Urine analysis:    Component Value Date/Time   COLORURINE YELLOW 09/29/2023 2114   APPEARANCEUR CLEAR 09/29/2023 2114   LABSPEC 1.005 09/29/2023 2114   PHURINE 5.5 09/29/2023 2114   GLUCOSEU NEGATIVE 09/29/2023 2114   HGBUR NEGATIVE 09/29/2023 2114   BILIRUBINUR NEGATIVE 09/29/2023 2114    KETONESUR NEGATIVE 09/29/2023 2114   PROTEINUR NEGATIVE 09/29/2023 2114   UROBILINOGEN 0.2 01/17/2013 0022   NITRITE NEGATIVE 09/29/2023 2114   LEUKOCYTESUR NEGATIVE 09/29/2023 2114    Radiological Exams on Admission: CT Angio Chest PE W and/or Wo Contrast Result Date: 09/30/2023 CLINICAL DATA:  Shortness of breath EXAM: CT ANGIOGRAPHY CHEST WITH CONTRAST TECHNIQUE: Multidetector CT imaging of the chest was performed using the standard protocol during bolus administration of intravenous contrast. Multiplanar CT image reconstructions and MIPs were obtained to evaluate the vascular anatomy. RADIATION DOSE REDUCTION: This exam was performed according to the departmental dose-optimization program which includes automated exposure control, adjustment of the mA and/or kV according to patient size and/or use of iterative reconstruction technique. CONTRAST:  65mL OMNIPAQUE  IOHEXOL  350 MG/ML SOLN COMPARISON:  Chest x-ray 09/29/2023, chest 05/25/2023, PET CT 06/29/2023, 03/07/2023 FINDINGS: Cardiovascular: Satisfactory opacification of the pulmonary arteries to the segmental level. No evidence of pulmonary embolism. Mild aortic atherosclerosis. No aneurysm or dissection. Normal cardiac size. No pericardial effusion Mediastinum/Nodes: Patent trachea. No thyroid  mass. No suspicious lymph nodes. Esophagus within normal limits. Lungs/Pleura: Emphysema. No acute airspace disease, pleural effusion, or pneumothorax. 4 mm right upper lobe pulmonary nodule on series 6, image 46, slightly decreased in size. Ground-glass nodule adjacent to the right minor fissure measures about 6.6 mm on series 6 image 77, previously 7 mm. Right lower lobe slightly spiculated adjacent pulmonary nodules measuring 8 mm on series 6, image 95 and 9 mm. Part solid adjacent pulmonary nodule measuring 14 mm on series 6, image 97 with the solid component measuring about 8 mm, previously 14 mm and 5 mm respectively. Right lower lobe nodules are  decreased  in size compared with interval PET CT from 06/29/2023. Upper Abdomen: No acute finding Musculoskeletal: No acute or suspicious osseous abnormality. Review of the MIP images confirms the above findings. IMPRESSION: 1. Negative for acute pulmonary embolus or aortic dissection. 2. Emphysema. No acute airspace disease. 3. Multiple right-sided pulmonary nodules as described above. The 2 most suspicious nodules in the right lower lobe show interval decrease in size compared with PET CT from 06/29/2023 but appear slightly increased compared with the most recent chest CT from April 2025 and appear more solid with spiculated morphology. Continued close imaging follow-up is recommended as it is uncertain if these nodules are inflammatory or neoplastic in etiology. 4. Aortic atherosclerosis. Aortic Atherosclerosis (ICD10-I70.0) and Emphysema (ICD10-J43.9). Electronically Signed   By: Luke Bun M.D.   On: 09/30/2023 00:43   DG Chest Port 1 View Result Date: 09/29/2023 CLINICAL DATA:  Shortness of breath, respiratory distress, COPD EXAM: PORTABLE CHEST 1 VIEW COMPARISON:  06/11/2023 FINDINGS: The heart size and mediastinal contours are within normal limits. Both lungs are clear. The visualized skeletal structures are unremarkable. IMPRESSION: No active disease. Electronically Signed   By: Franky Crease M.D.   On: 09/29/2023 21:59    Assessment and Plan  Acute COPD exacerbation In the setting of medication nonadherence.  He ran out of Trelegy 3 or 4 months ago.  Oxygen saturation in the high 90s on room air on arrival to the ED but patient had increased work of breathing.  He was tachypneic to the 30s initially and was placed on 2 L [place] for comfort.  Work of breathing has now significantly improved after steroids and bronchodilator treatments, no longer wheezing.  COVID/influenza/RSV PCR negative.  CTA chest showing no PE or acute airspace disease.  VBG without evidence of hypercapnia.  Will hold off starting  antibiotics given no increased sputum production or purulence.  No fever or leukocytosis.  Start prednisone  40 mg daily in the morning.  Placed on Breztri  which is hospital formulary replacement for his home Trelegy.  DuoNeb every 6 hours PRN.  Wean supplemental oxygen as tolerated.  Chest tightness and elevated troponin History of CAD Suspect chest tightness was related to bronchospasm as it has resolved after patient received bronchodilator treatments.  He is currently resting comfortably.  Troponin 32.  No EKG done in the ED.  Stat EKG ordered and trending troponin.  Hypertensive urgency Blood pressure elevated in the ED with SBP up to 190s.  Most recent SBP in the 170s.  IV hydralazine  PRN SBP >160.  Unable to confirm with the patient which medications he takes at home.  Resume home p.o. meds after pharmacy med rec is done.  History of non-small cell lung cancer Last radiation treatment was in June 2025.   CT showing:  Multiple right-sided pulmonary nodules as described above. The 2 most suspicious nodules in the right lower lobe show interval decrease in size compared with PET CT from 06/29/2023 but appear slightly increased compared with the most recent chest CT from April 2025 and appear more solid with spiculated morphology.  Continued close imaging follow-up is recommended as it is uncertain if these nodules are inflammatory or neoplastic in etiology. Patient will need close outpatient oncology/radiation oncology follow-up.  Chronic HFpEF Last echo done in June 2025 showing EF 60 to 65% and grade 1 diastolic dysfunction.  proBNP 450.  Patient does not appear volume overloaded on exam.  No evidence of pulmonary edema on imaging.  Cigarette smoking Nicotine  patch and counseled  to quit.  Paroxysmal A-fib CHA2DS2-VASc 6 (age, CHF, hypertension, TIA, CAD/PAD) and patient is not on anticoagulation?  He is followed by outpatient cardiology.  CKD stage IIIb Creatinine  improved.  OSA Continue nightly CPAP.  HIV: CD4 count 843 and viral load undetectable on labs done a month ago. Hyperlipidemia Gout PAD Anxiety Chronic insomnia Pharmacy med rec pending.  DVT prophylaxis: Lovenox  Code Status: Full Code (discussed with the patient) Family Communication: No family available at this time. Level of care: Telemetry bed Admission status: It is my clinical opinion that admission to INPATIENT is reasonable and necessary because of the expectation that this patient will require hospital care that crosses at least 2 midnights to treat this condition based on the medical complexity of the problems presented.  Given the aforementioned information, the predictability of an adverse outcome is felt to be significant.  Editha Ram MD [facility]  If 7PM-7AM, please contact night-coverage [email or web address]  09/30/2023, 1:28 AM

## 2023-09-30 NOTE — Care Management Obs Status (Signed)
 MEDICARE OBSERVATION STATUS NOTIFICATION   Patient Details  Name: CHAMP KEETCH MRN: [id number] Date of Birth: [birth year]   Medicare Observation Status Notification Given:  Yes    Sonda Manuella Quill, RN 09/30/2023, 1:25 PM

## 2023-09-30 NOTE — Discharge Summary (Signed)
 Physician Discharge Summary   Patient: Kevin [name] MRN: [id number] DOB: December 24, 1955  Admit date:     09/29/2023  Discharge date: 09/30/23  Discharge Physician: Toribio Door   PCP: [name], MD   Recommendations at discharge:  Resolution of COPD exacerbation  Discharge Diagnoses: Principal Problem:   COPD with acute exacerbation (HCC) Active Problems:   CKD stage 3b, GFR 30-44 ml/min (HCC) - baseline SCr 1.8-2.2   Paroxysmal A-fib (HCC)   Hypertensive urgency   Elevated troponin  Resolved Problems:   * No resolved hospital problems. *  Hospital Course: 68 year old man PMH including HIV, COPD, lung cancer, presented with shortness of breath.  Admitted for COPD exacerbation.  Consultants None   Procedures/Events 8/16 admit for COPD exacerbation  Treated for COPD exacerbation with rapid clinical improvement.  Infectious workup was negative and CT chest no acute abnormalities.  Will discharge on prednisone  taper as well as albuterol  and other inhaler.  Chest tightness from bronchospasm, EKG nonacute, troponins trivial elevation.  Hypertensive urgency resolved.  HIV, chronic heart failure and atrial fibrillation as well as CKD stable.  Disposition: Home Diet recommendation:  Discharge Diet Orders (From admission, onward)     Start     Ordered   09/30/23 0000  Diet general        09/30/23 1233           Regular diet DISCHARGE MEDICATION: Allergies as of 09/30/2023       Reactions   Isosorbide  Mononitrate [isosorbide  Nitrate] Shortness Of Breath        Medication List     STOP taking these medications    BC HEADACHE POWDER PO       TAKE these medications    albuterol  108 (90 Base) MCG/ACT inhaler Commonly known as: Ventolin  HFA Inhale 1-2 puffs into the lungs every 6 (six) hours as needed for wheezing or shortness of breath.   amLODipine -valsartan  10-320 MG tablet Commonly known as: EXFORGE  Take 1 tablet by mouth daily.   Aspirin  Low  Dose 81 MG tablet Generic drug: aspirin  EC Take 1 tablet (81 mg total) by mouth daily, swallow whole   atorvastatin  80 MG tablet Commonly known as: LIPITOR Take 1 tablet (80 mg total) by mouth daily.   baclofen 10 MG tablet Commonly known as: LIORESAL Take 10 mg by mouth 3 (three) times daily.   celecoxib 200 MG capsule Commonly known as: CELEBREX Take 200 mg by mouth 2 (two) times daily as needed (for pain).   ciclopirox  8 % solution Commonly known as: PENLAC  Apply topically at bedtime. Apply over nail and surrounding skin. Apply daily over previous coat. After seven (7) days, may remove with alcohol and continue cycle.   Dovato  50-300 MG tablet Generic drug: dolutegravir -lamiVUDine  Take 1 tablet by mouth daily.   hydrALAZINE  25 MG tablet Commonly known as: APRESOLINE  Take 1 tablet (25 mg total) by mouth 2 (two) times daily.   ipratropium-albuterol  0.5-2.5 (3) MG/3ML Soln Commonly known as: DUONEB Take 3 mLs by nebulization every 6 (six) hours as needed. What changed: reasons to take this   montelukast  10 MG tablet Commonly known as: SINGULAIR  Take 1 tablet (10 mg total) by mouth at bedtime.   nebivolol  5 MG tablet Commonly known as: BYSTOLIC  Take 1 tablet (5 mg total) by mouth daily.   predniSONE  10 MG tablet Commonly known as: DELTASONE  Take 4 tablets (40 mg total) by mouth daily for 3 days, THEN 2 tablets (20 mg total) daily for 3 days,  THEN 1 tablet (10 mg total) daily for 3 days. Start taking on: September 30, 2023   temazepam  30 MG capsule Commonly known as: RESTORIL  Take 1 capsule (30 mg total) by mouth at bedtime as needed for sleep.   Trelegy Ellipta  200-62.5-25 MCG/ACT Aepb Generic drug: Fluticasone -Umeclidin-Vilant Inhale 1 puff into the lungs daily.   Vitamin D  (Ergocalciferol ) 1.25 MG (50000 UNIT) Caps capsule Commonly known as: DRISDOL  Take one capsule (50,000 Units total) by mouth once a week for 12 weeks, then recheck level in the office What  changed:  how much to take when to take this        Follow-up Information     [name], MD. Schedule an appointment as soon as possible for a visit in 1 week(s).   Specialty: Family Medicine Contact information: [address] [place] [address] [phone]                Feels better, breathing better  Discharge Exam: Filed Weights   09/29/23 2043  Weight: 72.6 kg   Physical Exam Vitals reviewed.  Constitutional:      General: He is not in acute distress.    Appearance: He is not ill-appearing or toxic-appearing.     Comments: Walking in hallway independently, looks calm and comfortable walking.  Neurological:     Mental Status: He is alert.      Condition at discharge: good  The results of significant diagnostics from this hospitalization (including imaging, microbiology, ancillary and laboratory) are listed below for reference.   Imaging Studies: CT Angio Chest PE W and/or Wo Contrast Result Date: 09/30/2023 CLINICAL DATA:  Shortness of breath EXAM: CT ANGIOGRAPHY CHEST WITH CONTRAST TECHNIQUE: Multidetector CT imaging of the chest was performed using the standard protocol during bolus administration of intravenous contrast. Multiplanar CT image reconstructions and MIPs were obtained to evaluate the vascular anatomy. RADIATION DOSE REDUCTION: This exam was performed according to the departmental dose-optimization program which includes automated exposure control, adjustment of the mA and/or kV according to patient size and/or use of iterative reconstruction technique. CONTRAST:  65mL OMNIPAQUE  IOHEXOL  350 MG/ML SOLN COMPARISON:  Chest x-ray 09/29/2023, chest 05/25/2023, PET CT 06/29/2023, 03/07/2023 FINDINGS: Cardiovascular: Satisfactory opacification of the pulmonary arteries to the segmental level. No evidence of pulmonary embolism. Mild aortic atherosclerosis. No aneurysm or dissection. Normal cardiac size. No pericardial effusion  Mediastinum/Nodes: Patent trachea. No thyroid  mass. No suspicious lymph nodes. Esophagus within normal limits. Lungs/Pleura: Emphysema. No acute airspace disease, pleural effusion, or pneumothorax. 4 mm right upper lobe pulmonary nodule on series 6, image 46, slightly decreased in size. Ground-glass nodule adjacent to the right minor fissure measures about 6.6 mm on series 6 image 77, previously 7 mm. Right lower lobe slightly spiculated adjacent pulmonary nodules measuring 8 mm on series 6, image 95 and 9 mm. Part solid adjacent pulmonary nodule measuring 14 mm on series 6, image 97 with the solid component measuring about 8 mm, previously 14 mm and 5 mm respectively. Right lower lobe nodules are decreased in size compared with interval PET CT from 06/29/2023. Upper Abdomen: No acute finding Musculoskeletal: No acute or suspicious osseous abnormality. Review of the MIP images confirms the above findings. IMPRESSION: 1. Negative for acute pulmonary embolus or aortic dissection. 2. Emphysema. No acute airspace disease. 3. Multiple right-sided pulmonary nodules as described above. The 2 most suspicious nodules in the right lower lobe show interval decrease in size compared with PET CT from 06/29/2023 but appear slightly increased  compared with the most recent chest CT from April 2025 and appear more solid with spiculated morphology. Continued close imaging follow-up is recommended as it is uncertain if these nodules are inflammatory or neoplastic in etiology. 4. Aortic atherosclerosis. Aortic Atherosclerosis (ICD10-I70.0) and Emphysema (ICD10-J43.9). Electronically Signed   By: Luke Bun M.D.   On: 09/30/2023 00:43   DG Chest Port 1 View Result Date: 09/29/2023 CLINICAL DATA:  Shortness of breath, respiratory distress, COPD EXAM: PORTABLE CHEST 1 VIEW COMPARISON:  06/11/2023 FINDINGS: The heart size and mediastinal contours are within normal limits. Both lungs are clear. The visualized skeletal structures are  unremarkable. IMPRESSION: No active disease. Electronically Signed   By: Franky Crease M.D.   On: 09/29/2023 21:59    Microbiology: Results for orders placed or performed during the hospital encounter of 09/29/23  Resp panel by RT-PCR (RSV, Flu A&B, Covid) Anterior Nasal Swab     Status: None   Collection Time: 09/29/23  9:14 PM   Specimen: Anterior Nasal Swab  Result Value Ref Range Status   SARS Coronavirus 2 by RT PCR NEGATIVE NEGATIVE Final    Comment: (NOTE) SARS-CoV-2 target nucleic acids are NOT DETECTED.  The SARS-CoV-2 RNA is generally detectable in upper respiratory specimens during the acute phase of infection. The lowest concentration of SARS-CoV-2 viral copies this assay can detect is 138 copies/mL. A negative result does not preclude SARS-Cov-2 infection and should not be used as the sole basis for treatment or other patient management decisions. A negative result may occur with  improper specimen collection/handling, submission of specimen other than nasopharyngeal swab, presence of viral mutation(s) within the areas targeted by this assay, and inadequate number of viral copies(<138 copies/mL). A negative result must be combined with clinical observations, patient history, and epidemiological information. The expected result is Negative.  Fact Sheet for Patients:  BloggerCourse.com  Fact Sheet for Healthcare Providers:  SeriousBroker.it  This test is no t yet approved or cleared by the [place]  FDA and  has been authorized for detection and/or diagnosis of SARS-CoV-2 by FDA under an Emergency Use Authorization (EUA). This EUA will remain  in effect (meaning this test can be used) for the duration of the COVID-19 declaration under Section 564(b)(1) of the Act, 21 U.S.C.section 360bbb-3(b)(1), unless the authorization is terminated  or revoked sooner.       Influenza A by PCR NEGATIVE NEGATIVE Final    Influenza B by PCR NEGATIVE NEGATIVE Final    Comment: (NOTE) The Xpert Xpress SARS-CoV-2/FLU/RSV plus assay is intended as an aid in the diagnosis of influenza from Nasopharyngeal swab specimens and should not be used as a sole basis for treatment. Nasal washings and aspirates are unacceptable for Xpert Xpress SARS-CoV-2/FLU/RSV testing.  Fact Sheet for Patients: BloggerCourse.com  Fact Sheet for Healthcare Providers: SeriousBroker.it  This test is not yet approved or cleared by the [place]  FDA and has been authorized for detection and/or diagnosis of SARS-CoV-2 by FDA under an Emergency Use Authorization (EUA). This EUA will remain in effect (meaning this test can be used) for the duration of the COVID-19 declaration under Section 564(b)(1) of the Act, 21 U.S.C. section 360bbb-3(b)(1), unless the authorization is terminated or revoked.     Resp Syncytial Virus by PCR NEGATIVE NEGATIVE Final    Comment: (NOTE) Fact Sheet for Patients: BloggerCourse.com  Fact Sheet for Healthcare Providers: SeriousBroker.it  This test is not yet approved or cleared by the [place]  FDA and has been authorized for detection and/or  diagnosis of SARS-CoV-2 by FDA under an Emergency Use Authorization (EUA). This EUA will remain in effect (meaning this test can be used) for the duration of the COVID-19 declaration under Section 564(b)(1) of the Act, 21 U.S.C. section 360bbb-3(b)(1), unless the authorization is terminated or revoked.  Performed at [facility], [address] [place], [place] [address]     Labs: CBC: Recent Labs  Lab 09/29/23 2114 09/29/23 2120  WBC 8.5  --   NEUTROABS 5.4  --   HGB 11.3* 11.2*  HCT 32.7* 33.0*  MCV 95.1  --   PLT 260  --    Basic Metabolic Panel: Recent Labs  Lab 09/29/23 2114 09/29/23 2120  NA 141 142  K 3.9 4.7   CL 109  --   CO2 21*  --   GLUCOSE 83  --   BUN 22  --   CREATININE 1.93*  --   CALCIUM  8.9  --    Liver Function Tests: Recent Labs  Lab 09/29/23 2114  AST 17  ALT 7  ALKPHOS 82  BILITOT 0.5  PROT 7.4  ALBUMIN  4.3   CBG: No results for input(s): GLUCAP in the last 168 hours.  Discharge time spent: greater than 30 minutes.  Signed: Toribio Door, MD [facility] 09/30/2023

## 2023-09-30 NOTE — Hospital Course (Addendum)
 68 year old man PMH including HIV, COPD, lung cancer, presented with shortness of breath.  Admitted for COPD exacerbation.  Consultants None   Procedures/Events 8/16 admit for COPD exacerbation

## 2023-09-30 NOTE — Plan of Care (Signed)
   Problem: Education: Goal: Knowledge of General Education information will improve Description: Including pain rating scale, medication(s)/side effects and non-pharmacologic comfort measures Outcome: Adequate for Discharge   Problem: Health Behavior/Discharge Planning: Goal: Ability to manage health-related needs will improve Outcome: Adequate for Discharge   Problem: Clinical Measurements: Goal: Ability to maintain clinical measurements within normal limits will improve Outcome: Adequate for Discharge Goal: Will remain free from infection Outcome: Adequate for Discharge Goal: Diagnostic test results will improve Outcome: Adequate for Discharge Goal: Respiratory complications will improve Outcome: Adequate for Discharge Goal: Cardiovascular complication will be avoided Outcome: Adequate for Discharge   Problem: Activity: Goal: Risk for activity intolerance will decrease Outcome: Adequate for Discharge   Problem: Nutrition: Goal: Adequate nutrition will be maintained Outcome: Adequate for Discharge   Problem: Coping: Goal: Level of anxiety will decrease Outcome: Adequate for Discharge   Problem: Elimination: Goal: Will not experience complications related to bowel motility Outcome: Adequate for Discharge Goal: Will not experience complications related to urinary retention Outcome: Adequate for Discharge   Problem: Pain Managment: Goal: General experience of comfort will improve and/or be controlled Outcome: Adequate for Discharge   Problem: Safety: Goal: Ability to remain free from injury will improve Outcome: Adequate for Discharge   Problem: Skin Integrity: Goal: Risk for impaired skin integrity will decrease Outcome: Adequate for Discharge   Problem: Education: Goal: Knowledge of disease or condition will improve Outcome: Adequate for Discharge Goal: Knowledge of the prescribed therapeutic regimen will improve Outcome: Adequate for Discharge Goal:  Individualized Educational Video(s) Outcome: Adequate for Discharge   Problem: Activity: Goal: Ability to tolerate increased activity will improve Outcome: Adequate for Discharge Goal: Will verbalize the importance of balancing activity with adequate rest periods Outcome: Adequate for Discharge   Problem: Respiratory: Goal: Ability to maintain a clear airway will improve Outcome: Adequate for Discharge Goal: Levels of oxygenation will improve Outcome: Adequate for Discharge Goal: Ability to maintain adequate ventilation will improve Outcome: Adequate for Discharge

## 2023-09-30 NOTE — Care Management CC44 (Signed)
 Condition Code 44 Documentation Completed  Patient Details  Name: ALDYN TOON MRN: [id number] Date of Birth: 1955-02-26   Condition Code 44 given:  Yes Patient signature on Condition Code 44 notice:  Yes Documentation of 2 MD's agreement:  Yes Code 44 added to claim:  Yes    Sonda Manuella Quill, RN 09/30/2023, 1:25 PM

## 2023-09-30 NOTE — TOC Initial Note (Signed)
 Transition of Care [facility]) - Initial/Assessment Note    Patient Details  Name: Kevin [name] MRN: [id number] Date of Birth: 1955/07/21  [facility] CM/SW Contact:    Sonda Manuella Quill, RN Phone Number: 09/30/2023, 12:59 PM  Clinical Narrative:                 [facility] consulted for medication assist; spoke w/ pt in room and dtr [facility] [phone]) on speaker phone; pt says he lives at home; he plans to return at d/c; his dtr will provide transportation; he verfied insurance/PCP; pt denied SDOH risks; he does not have DME, HH services, or home oxygen; pt/dtr notified he does not qualify for MATCH as he has insurance; pt says he would RXs sent to [facility]; Dr Jadine notified via secure chat; no TOC needs.  Expected Discharge Plan: Home/Self Care Barriers to Discharge: No Barriers Identified   Patient Goals and CMS Choice Patient states their goals for this hospitalization and ongoing recovery are:: home CMS Medicare.gov Compare Post Acute Care list provided to:: Patient        Expected Discharge Plan and Services   Discharge Planning Services: CM Consult   Living arrangements for the past 2 months: Apartment Expected Discharge Date: 09/30/23               DME Arranged: N/A DME Agency: NA       HH Arranged: NA HH Agency: NA        Prior Living Arrangements/Services Living arrangements for the past 2 months: Apartment Lives with:: Self Patient language and need for interpreter reviewed:: Yes        Need for Family Participation in Patient Care: Yes (Comment) Care giver support system in place?: Yes (comment) Current home services:  (n/a) Criminal Activity/Legal Involvement Pertinent to Current Situation/Hospitalization: No - Comment as needed  Activities of Daily Living   ADL Screening (condition at time of admission) Independently performs ADLs?: Yes (appropriate for developmental age) Is the patient deaf or have difficulty  hearing?: No Does the patient have difficulty seeing, even when wearing glasses/contacts?: No Does the patient have difficulty concentrating, remembering, or making decisions?: No  Permission Sought/Granted Permission sought to share information with : Case Manager Permission granted to share information with : Yes, Verbal Permission Granted  Share Information with NAME: Case Manager     Permission granted to share info w Relationship: Roxie Sakai (dtr) [phone]     Emotional Assessment Appearance:: Appears stated age Attitude/Demeanor/Rapport: Gracious Affect (typically observed): Accepting Orientation: : Oriented to Self, Oriented to Place, Oriented to  Time, Oriented to Situation Alcohol / Substance Use: Not Applicable Psych Involvement: No (comment)  Admission diagnosis:  Respiratory distress [R06.03] COPD exacerbation (HCC) [J44.1] COPD with acute exacerbation (HCC) [J44.1] Patient Active Problem List   Diagnosis Date Noted   Health care maintenance 08/31/2023   Screening for STDs (sexually transmitted diseases) 08/31/2023   HIV disease (HCC) 08/31/2023   Immunization counseling 08/31/2023   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) 08/21/2023   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   Disability affecting daily living 08/21/2023   Stroke-like episode 08/21/2023   Neoplastic malignant related fatigue 08/21/2023   Hypertensive emergency 08/01/2023   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   Left renal mass 04/03/2023   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   COPD with acute exacerbation (HCC) 03/19/2023   COPD exacerbation (HCC) 12/21/2022   Anxiety 12/19/2022  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   Skin nodule 08/08/2022   Lung nodule, multiple 07/26/2022   Lipoma 07/06/2022   Chronic low back pain 04/29/2022   Chronic heart failure with preserved ejection fraction (HFpEF) (HCC) 12/27/2021   HLD (hyperlipidemia)  12/27/2021   Elevated troponin 12/27/2021   Right renal mass 06/29/2021   Renal lesion 06/29/2021   Acute respiratory failure with hypoxia (HCC)    Callus of foot 07/10/2018   Hypertensive urgency 08/15/2017   CAD (coronary artery disease) 10/19/2016   Easy bruising 08/11/2016   Claudication in peripheral vascular disease (HCC) 06/05/2016   CKD stage 3b, GFR 30-44 ml/min (HCC) - baseline SCr 1.8-2.2    Peripheral arterial disease (HCC) 05/09/2016   OSA (obstructive sleep apnea) 03/24/2016   Essential hypertension 02/18/2016   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 [place] [address] Phone: [phone] Fax: [phone]  [id number] [place], [place] - [address] AT [facility] OF [address] [place] [address] Phone: [phone] Fax: [phone]     Social Drivers of Health (SDOH) Social History: SDOH Screenings   Food Insecurity: No Food Insecurity (09/30/2023)  Housing: Low Risk  (09/30/2023)  Transportation Needs: No Transportation Needs (09/30/2023)  Utilities: Not At Risk (09/30/2023)  Alcohol Screen: Low Risk  (10/05/2022)  Depression (PHQ2-9): Low Risk  (08/31/2023)  Financial Resource Strain: High Risk (10/05/2022)  Physical Activity: Inactive (10/05/2022)  Social Connections: Moderately Isolated (09/30/2023)  Stress: Stress Concern Present (05/04/2022)  Tobacco Use: High Risk (09/29/2023)   SDOH Interventions: Food Insecurity Interventions: Intervention Not Indicated, Inpatient TOC Housing Interventions: Intervention Not Indicated, Inpatient  TOC Transportation Interventions: Intervention Not Indicated, Inpatient TOC Utilities Interventions: Intervention Not Indicated, Inpatient TOC   Readmission Risk Interventions    09/30/2023   12:57 PM 12/29/2021    2:42 PM  Readmission Risk Prevention Plan  Transportation Screening Complete Complete  PCP or Specialist Appt within 5-7 Days Complete   Home Care Screening Complete   Medication Review (RN CM) Complete   Medication Review (RN Care Manager)  Complete  PCP or Specialist appointment within 3-5 days of discharge  Complete  HRI or Home Care Consult  Complete  SW Recovery Care/Counseling Consult  Complete  Palliative Care Screening  Not Applicable  Skilled Nursing Facility  Not Applicable

## 2023-10-01 ENCOUNTER — Other Ambulatory Visit: Payer: Self-pay

## 2023-10-01 ENCOUNTER — Telehealth: Payer: Self-pay

## 2023-10-01 DIAGNOSIS — E538 Deficiency of other specified B group vitamins: Secondary | ICD-10-CM | POA: Diagnosis not present

## 2023-10-01 DIAGNOSIS — Z79899 Other long term (current) drug therapy: Secondary | ICD-10-CM | POA: Diagnosis not present

## 2023-10-01 DIAGNOSIS — C641 Malignant neoplasm of right kidney, except renal pelvis: Secondary | ICD-10-CM | POA: Diagnosis not present

## 2023-10-01 DIAGNOSIS — C3490 Malignant neoplasm of unspecified part of unspecified bronchus or lung: Secondary | ICD-10-CM | POA: Diagnosis not present

## 2023-10-01 DIAGNOSIS — M51361 Other intervertebral disc degeneration, lumbar region with lower extremity pain only: Secondary | ICD-10-CM | POA: Diagnosis not present

## 2023-10-01 DIAGNOSIS — F17210 Nicotine dependence, cigarettes, uncomplicated: Secondary | ICD-10-CM | POA: Diagnosis not present

## 2023-10-01 DIAGNOSIS — Z09 Encounter for follow-up examination after completed treatment for conditions other than malignant neoplasm: Secondary | ICD-10-CM | POA: Diagnosis not present

## 2023-10-01 DIAGNOSIS — J449 Chronic obstructive pulmonary disease, unspecified: Secondary | ICD-10-CM | POA: Diagnosis not present

## 2023-10-01 DIAGNOSIS — N1832 Chronic kidney disease, stage 3b: Secondary | ICD-10-CM | POA: Diagnosis not present

## 2023-10-01 DIAGNOSIS — D509 Iron deficiency anemia, unspecified: Secondary | ICD-10-CM | POA: Diagnosis not present

## 2023-10-01 LAB — TROPONIN T: Troponin T (Highly Sensitive): 34 ng/L — ABNORMAL HIGH (ref 0–22)

## 2023-10-01 NOTE — Transitions of Care (Post Inpatient/ED Visit) (Signed)
   10/01/2023  Name: Kevin [name] MRN: [id number] DOB: [birth year]  Today's TOC FU Call Status: Today's TOC FU Call Status:: Unsuccessful Call (1st Attempt) Unsuccessful Call (1st Attempt) Date: 10/01/23  Attempted to reach the patient regarding the most recent Inpatient/ED visit.  Follow Up Plan: Additional outreach attempts will be made to reach the patient to complete the Transitions of Care (Post Inpatient/ED visit) call.   Signature  Charmaine Bloodgood, LPN [facility] Health Advisor [facility] You Are. We Are. [facility] Direct Dial [phone]

## 2023-10-03 ENCOUNTER — Other Ambulatory Visit (HOSPITAL_COMMUNITY): Payer: Self-pay

## 2023-10-04 ENCOUNTER — Telehealth: Payer: Self-pay

## 2023-10-04 DIAGNOSIS — Z79899 Other long term (current) drug therapy: Secondary | ICD-10-CM | POA: Diagnosis not present

## 2023-10-04 NOTE — Telephone Encounter (Signed)
 Copied from CRM #[id number]. Topic: General - Other >> Oct 04, 2023  8:13 AM Revonda D wrote: Reason for CRM: Pt's daughter stated that she will be faxing over paperwork today that needs to be signed by Dr.Thompson.

## 2023-10-04 NOTE — Telephone Encounter (Signed)
 Noted

## 2023-10-08 ENCOUNTER — Ambulatory Visit (HOSPITAL_COMMUNITY)
Admission: RE | Admit: 2023-10-08 | Discharge: 2023-10-08 | Disposition: A | Discharge status: Home / Self Care | Source: Ambulatory Visit | Attending: Podiatry | Admitting: Podiatry

## 2023-10-08 DIAGNOSIS — E538 Deficiency of other specified B group vitamins: Secondary | ICD-10-CM | POA: Diagnosis not present

## 2023-10-08 DIAGNOSIS — Z79899 Other long term (current) drug therapy: Secondary | ICD-10-CM | POA: Diagnosis not present

## 2023-10-08 DIAGNOSIS — D509 Iron deficiency anemia, unspecified: Secondary | ICD-10-CM | POA: Diagnosis not present

## 2023-10-08 DIAGNOSIS — C3490 Malignant neoplasm of unspecified part of unspecified bronchus or lung: Secondary | ICD-10-CM | POA: Diagnosis not present

## 2023-10-08 DIAGNOSIS — C641 Malignant neoplasm of right kidney, except renal pelvis: Secondary | ICD-10-CM | POA: Diagnosis not present

## 2023-10-08 DIAGNOSIS — I739 Peripheral vascular disease, unspecified: Secondary | ICD-10-CM | POA: Insufficient documentation

## 2023-10-08 DIAGNOSIS — N1832 Chronic kidney disease, stage 3b: Secondary | ICD-10-CM | POA: Diagnosis not present

## 2023-10-08 DIAGNOSIS — J449 Chronic obstructive pulmonary disease, unspecified: Secondary | ICD-10-CM | POA: Diagnosis not present

## 2023-10-08 DIAGNOSIS — M51361 Other intervertebral disc degeneration, lumbar region with lower extremity pain only: Secondary | ICD-10-CM | POA: Diagnosis not present

## 2023-10-09 ENCOUNTER — Other Ambulatory Visit: Payer: Self-pay | Admitting: Podiatry

## 2023-10-09 ENCOUNTER — Ambulatory Visit: Payer: Self-pay | Admitting: Podiatry

## 2023-10-09 ENCOUNTER — Telehealth: Payer: Self-pay | Admitting: Family Medicine

## 2023-10-09 DIAGNOSIS — I739 Peripheral vascular disease, unspecified: Secondary | ICD-10-CM

## 2023-10-09 LAB — VAS US ABI WITH/WO TBI
Left ABI: 0.7
Right ABI: 0.74

## 2023-10-09 NOTE — Telephone Encounter (Signed)
 Patient dropped off document FMLA, to be filled out by provider. Patient requested to send it back via Call Patient to pick up within ASAP. Document is located in providers tray at front office.Please advise at Mobile [phone] (mobile)

## 2023-10-09 NOTE — Telephone Encounter (Signed)
 FMLA paperwork was collected and placed in provider sign/review folder on 10/09/2023 @ 11:50 am

## 2023-10-10 NOTE — Telephone Encounter (Signed)
 Called patient and left brief VM to schedule OV for FMLA paperwork completion

## 2023-10-12 DIAGNOSIS — Z79899 Other long term (current) drug therapy: Secondary | ICD-10-CM | POA: Diagnosis not present

## 2023-10-16 ENCOUNTER — Telehealth: Payer: Self-pay | Admitting: Internal Medicine

## 2023-10-19 ENCOUNTER — Ambulatory Visit: Admitting: Family Medicine

## 2023-10-19 ENCOUNTER — Encounter: Payer: Self-pay | Admitting: Family Medicine

## 2023-10-19 ENCOUNTER — Other Ambulatory Visit: Payer: Self-pay

## 2023-10-19 VITALS — BP 141/83 | HR 98 | Resp 18 | Wt 156.4 lb

## 2023-10-19 DIAGNOSIS — F5101 Primary insomnia: Secondary | ICD-10-CM | POA: Diagnosis not present

## 2023-10-19 DIAGNOSIS — Z23 Encounter for immunization: Secondary | ICD-10-CM | POA: Diagnosis not present

## 2023-10-19 DIAGNOSIS — N1832 Chronic kidney disease, stage 3b: Secondary | ICD-10-CM | POA: Diagnosis not present

## 2023-10-19 DIAGNOSIS — B20 Human immunodeficiency virus [HIV] disease: Secondary | ICD-10-CM | POA: Diagnosis not present

## 2023-10-19 DIAGNOSIS — J439 Emphysema, unspecified: Secondary | ICD-10-CM | POA: Diagnosis not present

## 2023-10-19 DIAGNOSIS — I5032 Chronic diastolic (congestive) heart failure: Secondary | ICD-10-CM | POA: Diagnosis not present

## 2023-10-19 DIAGNOSIS — G63 Polyneuropathy in diseases classified elsewhere: Secondary | ICD-10-CM | POA: Diagnosis not present

## 2023-10-19 DIAGNOSIS — C649 Malignant neoplasm of unspecified kidney, except renal pelvis: Secondary | ICD-10-CM | POA: Diagnosis not present

## 2023-10-19 DIAGNOSIS — J4489 Other specified chronic obstructive pulmonary disease: Secondary | ICD-10-CM

## 2023-10-19 DIAGNOSIS — Z131 Encounter for screening for diabetes mellitus: Secondary | ICD-10-CM | POA: Diagnosis not present

## 2023-10-19 DIAGNOSIS — F411 Generalized anxiety disorder: Secondary | ICD-10-CM | POA: Diagnosis not present

## 2023-10-19 DIAGNOSIS — N2581 Secondary hyperparathyroidism of renal origin: Secondary | ICD-10-CM | POA: Diagnosis not present

## 2023-10-19 DIAGNOSIS — E559 Vitamin D deficiency, unspecified: Secondary | ICD-10-CM | POA: Diagnosis not present

## 2023-10-19 LAB — POCT GLYCOSYLATED HEMOGLOBIN (HGB A1C): Hemoglobin A1C: 5.5 % (ref 4.0–5.6)

## 2023-10-19 MED ORDER — PREGABALIN 50 MG PO CAPS
50.0000 mg | ORAL_CAPSULE | Freq: Two times a day (BID) | ORAL | 0 refills | Status: DC
  Filled 2023-10-19: qty 60, 30d supply, fill #0

## 2023-10-19 MED ORDER — AMITRIPTYLINE HCL 10 MG PO TABS
10.0000 mg | ORAL_TABLET | Freq: Every day | ORAL | 0 refills | Status: DC
  Filled 2023-10-19 – 2023-10-31 (×3): qty 30, 30d supply, fill #0

## 2023-10-19 NOTE — Progress Notes (Signed)
 Assessment & Plan   Assessment/Plan:    Assessment & Plan Chronic obstructive pulmonary disease with recent exacerbation Recent exacerbation requiring hospitalization. Infectious workup negative. Discharged with steroid taper and resumed controller inhalers. - Continue controller inhalers  Peripheral neuropathy, likely multifactorial Burning sensation in feet, likely multifactorial. Possible causes include vascular disease, chemotherapy, HIV medications, and potential undiagnosed diabetes. Gabapentin  was ineffective. Pregabalin  considered for neuropathic pain and anxiety management. - Start pregabalin  (Lyrica ) 50 mg twice a day - Check A1c to evaluate for diabetes - Follow up in one month to assess response to pregabalin   Lung cancer, under surveillance after radiation Under surveillance following radiation therapy. - Perform CT scan for lung cancer surveillance  Chronic kidney disease, stage 3 CKD stage 3 with recent kidney function in the thirties. Adjustments made to medication dosing for renal impairment. - Adjust pregabalin  dosing to 50 mg twice a day due to CKD  Generalized anxiety disorder Ongoing anxiety, exacerbated by recent health issues. Previously experienced a panic attack during hospitalization. Currently on buspirone . Pregabalin  may aid in anxiety management. - Continue buspirone  - Monitor anxiety levels with pregabalin  treatment  Insomnia Difficulty sleeping, currently using temazepam . Considering amitriptyline , which may aid in anxiety and neuropathy. - Start amitriptyline  10 mg at bedtime - Evaluate response to amitriptyline  in one month  General Health Maintenance Discussed vaccinations for shingles and flu. Shingles vaccination recommended due to previous episode. Flu vaccination recommended due to respiratory issues. - Administer shingles vaccine - Administer flu vaccine      Medications Discontinued During This Encounter  Medication Reason    amLODipine  (NORVASC ) 10 MG tablet     Return in about 1 month (around 11/18/2023) for foot pain, sleep, and anxiety.        Subjective:   Encounter date: 10/19/2023  Kevin [name] is a 68 y.o. male who has Shortness of breath; Paroxysmal A-fib (HCC); Tobacco abuse; COPD with chronic bronchitis and emphysema (HCC); Blurry vision, bilateral; Hypertensive cardiovascular disease; Essential hypertension; OSA (obstructive sleep apnea); Peripheral arterial disease (HCC); CKD stage 3b, GFR 30-44 ml/min (HCC) - baseline SCr 1.8-2.2; Claudication in peripheral vascular disease (HCC); HIV (human immunodeficiency virus infection) (HCC); Easy bruising; CAD (coronary artery disease); Hypertensive urgency; Callus of foot; Acute respiratory failure with hypoxia (HCC); Right renal mass; Renal lesion; Chronic heart failure with preserved ejection fraction (HFpEF) (HCC); HLD (hyperlipidemia); Elevated troponin; Chronic low back pain; Lipoma; Lung nodule, multiple; Resistant hypertension; Skin nodule; Anxiety; COPD exacerbation (HCC); COPD with acute exacerbation (HCC); Left renal mass; HZV (herpes zoster virus) post herpetic neuralgia; 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   He  has a past medical history of AIDS (acquired immune deficiency syndrome) (HCC) (08/17/2016), Amaurosis fugax (08/18/2022), Chronic diastolic CHF (congestive heart failure), NYHA class 3 (HCC) (01/2016), Chronic lower back pain, CKD (chronic kidney disease), stage III (HCC), COPD (chronic obstructive pulmonary disease) (HCC), Dyspnea, Gout, Headache, Heart murmur, History of herpes zoster virus  (01/2023), Hypertension, Hypertensive crisis (08/15/2017), Lipoma (07/06/2022), Neuromuscular disorder (HCC) (02/05/2023), OSA on CPAP, PAD (peripheral artery disease) (HCC), PAF (paroxysmal atrial fibrillation) (HCC) (01/2016), 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 [employer].//HM due- vaccinations ) and Foot Injury (Pt c/o of left and right foot pain for awhile; unable to get out of bed at times; most recent podiatry OV was on 09/04/2023) .   Discussed the use of AI scribe software for clinical note transcription with the patient, who gave verbal consent to proceed.  History of Present Illness Kevin [name] is a 68 year old male with COPD exacerbation who presents for hospital follow-up and FMLA discussion.  He is here for a follow-up after a recent hospitalization due to a COPD exacerbation. He was discharged after treatment, which included a steroid taper and resumption of his controller inhalers. An infectious workup during his hospital stay was negative.  He experiences a persistent burning sensation in his feet. A podiatrist previously shaved down his bunions, but the burning persists. He has not yet received Lyrica , which was prescribed for this pain. He has tried gabapentin  in the past without relief.  He has difficulty sleeping, which he manages with temazepam , although he sometimes avoids taking it to prevent oversleeping for appointments.  He has a history of lung cancer, for which he underwent radiation therapy four months ago.  He has a history of kidney issues and saw a nephrologist today.  He experiences anxiety, which he manages with Buspirone . He had a panic attack during his last hospital visit, which was managed in the emergency room. He describes feeling 'overstimulated' at times and relies on  his daughters for support during these episodes.  He has a history of shingles.     ROS  Past Surgical History:  Procedure Laterality Date   BRONCHIAL BIOPSY  06/11/2023   Procedure: BRONCHOSCOPY, WITH BIOPSY;  Surgeon: Shelah Lamar RAMAN, MD;  Location: [facility] ENDOSCOPY;  Service: Pulmonary;;   BRONCHIAL BRUSHINGS  06/11/2023   Procedure: BRONCHOSCOPY, WITH BRUSH BIOPSY;  Surgeon: Shelah Lamar RAMAN, MD;  Location: [facility] ENDOSCOPY;  Service: Pulmonary;;   BRONCHIAL NEEDLE ASPIRATION BIOPSY  06/11/2023   Procedure: BRONCHOSCOPY, WITH NEEDLE ASPIRATION BIOPSY;  Surgeon: Shelah Lamar RAMAN, MD;  Location: [facility] ENDOSCOPY;  Service: Pulmonary;;   BRONCHIAL WASHINGS  06/11/2023   Procedure: IRRIGATION, BRONCHUS;  Surgeon: Shelah Lamar RAMAN, MD;  Location: [facility] ENDOSCOPY;  Service: Pulmonary;;   COLONOSCOPY N/A 04/24/2023   Procedure: COLONOSCOPY;  Surgeon: Rosalie Kitchens, MD;  Location: [facility] ENDOSCOPY;  Service: Gastroenterology;  Laterality: N/A;   IR RADIOLOGIST EVAL & MGMT  05/31/2021   IR RADIOLOGIST EVAL & MGMT  07/26/2021   LEFT HEART CATH AND CORONARY ANGIOGRAPHY N/A 10/23/2016   Procedure: LEFT HEART CATH AND CORONARY ANGIOGRAPHY;  Surgeon: Anner Alm ORN, MD;  Location: [facility] INVASIVE CV LAB;  Service: Cardiovascular;  Laterality: N/A;   LOWER EXTREMITY ANGIOGRAPHY N/A 07/17/2016   Procedure: Lower Extremity Angiography;  Surgeon: Court Dorn PARAS, MD;  Location: [facility] INVASIVE CV LAB;  Service: Cardiovascular;  Laterality: N/A;   LOWER EXTREMITY INTERVENTION N/A 06/05/2016   Procedure: Lower Extremity Intervention;  Surgeon: Dorn PARAS Court, MD;  Location: [facility] INVASIVE CV LAB;  Service: Cardiovascular;  Laterality: N/A;   PERIPHERAL VASCULAR ATHERECTOMY Right 07/17/2016   Procedure: Peripheral Vascular Atherectomy;  Surgeon: Court Dorn PARAS, MD;  Location: [facility] INVASIVE CV LAB;  Service: Cardiovascular;  Laterality: Right;  SFA   PERIPHERAL VASCULAR INTERVENTION  06/05/2016   Procedure: Peripheral Vascular Intervention;   Surgeon: Dorn PARAS Court, MD;  Location: [facility] INVASIVE CV LAB;  Service: Cardiovascular;;  left SFA   POLYPECTOMY  04/24/2023  Procedure: POLYPECTOMY;  Surgeon: Rosalie Kitchens, MD;  Location: [facility] ENDOSCOPY;  Service: Gastroenterology;;   RADIOLOGY WITH ANESTHESIA Right 06/29/2021   Procedure: RIGHT RENAL CRYOABLATION;  Surgeon: Philip Cornet, MD;  Location: [facility] ORS;  Service: Anesthesiology;  Laterality: Right;   VIDEO BRONCHOSCOPY WITH ENDOBRONCHIAL NAVIGATION Right 06/11/2023   Procedure: VIDEO BRONCHOSCOPY WITH ENDOBRONCHIAL NAVIGATION;  Surgeon: Shelah Lamar RAMAN, MD;  Location: [facility] ENDOSCOPY;  Service: Pulmonary;  Laterality: Right;  WITH FLUORO AND BIOPSY    Outpatient Medications Prior to Visit  Medication Sig Dispense Refill   albuterol  (VENTOLIN  HFA) 108 (90 Base) MCG/ACT inhaler Inhale 1-2 puffs into the lungs every 6 (six) hours as needed for wheezing or shortness of breath. 6.7 g 3   amLODipine -valsartan  (EXFORGE ) 10-320 MG tablet Take 1 tablet by mouth daily. 90 tablet 2   aspirin  EC (ASPIRIN  LOW DOSE) 81 MG tablet Take 1 tablet (81 mg total) by mouth daily, swallow whole 30 tablet 12   atorvastatin  (LIPITOR) 80 MG tablet Take 1 tablet (80 mg total) by mouth daily. 90 tablet 3   baclofen (LIORESAL) 10 MG tablet Take 10 mg by mouth 3 (three) times daily.     celecoxib (CELEBREX) 200 MG capsule Take 200 mg by mouth 2 (two) times daily as needed (for pain).     ciclopirox  (PENLAC ) 8 % solution Apply topically at bedtime. Apply over nail and surrounding skin. Apply daily over previous coat. After seven (7) days, may remove with alcohol and continue cycle. 6.6 mL 2   dolutegravir -lamiVUDine  (DOVATO ) 50-300 MG tablet Take 1 tablet by mouth daily. 30 tablet 5   Fluticasone -Umeclidin-Vilant (TRELEGY ELLIPTA ) 200-62.5-25 MCG/ACT AEPB Inhale 1 puff into the lungs daily. 60 each 2   hydrALAZINE  (APRESOLINE ) 25 MG tablet Take 1 tablet (25 mg total) by mouth 2 (two) times daily. 180 tablet 3   hydrOXYzine   (ATARAX ) 25 MG tablet Take 25 mg by mouth 4 (four) times daily.     ipratropium-albuterol  (DUONEB) 0.5-2.5 (3) MG/3ML SOLN Take 3 mLs by nebulization every 6 (six) hours as needed. (Patient taking differently: Take 3 mLs by nebulization every 6 (six) hours as needed (for shortness of breath or wheezing).) 360 mL 12   montelukast  (SINGULAIR ) 10 MG tablet Take 1 tablet (10 mg total) by mouth at bedtime. 30 tablet 5   nebivolol  (BYSTOLIC ) 5 MG tablet Take 1 tablet (5 mg total) by mouth daily. 90 tablet 3   oxyCODONE -acetaminophen  (PERCOCET) 10-325 MG tablet Take 1 tablet by mouth 3 (three) times daily.     temazepam  (RESTORIL ) 30 MG capsule Take 1 capsule (30 mg total) by mouth at bedtime as needed for sleep. 30 capsule 5   amLODipine  (NORVASC ) 10 MG tablet Oral; Duration: 30 Days (Patient not taking: Reported on 10/19/2023)     No facility-administered medications prior to visit.    Family History  Problem Relation Age of Onset   High blood pressure Mother    Lupus Mother    Seizures Daughter    Heart attack Daughter     Social History   Socioeconomic History   Marital status: Significant Other    Spouse name: Not on file   Number of children: Not on file   Years of education: Not on file   Highest education level: Not on file  Occupational History   Not on file  Tobacco Use   Smoking status: Some Days    Current packs/day: 0.10    Average packs/day: 0.1 packs/day for 42.0 years (4.2 ttl  pk-yrs)    Types: Cigarettes    Passive exposure: Never   Smokeless tobacco: Never   Tobacco comments:    Has not smoked since May 4th.2025-06/20/2023        Pt smoke 4-5 cigarettes per day  Vaping Use   Vaping status: Former   Substances: Nicotine , Flavoring  Substance and Sexual Activity   Alcohol use: Not Currently    Alcohol/week: 2.0 standard drinks of alcohol    Types: 2 Cans of beer per week    Comment: rare   Drug use: Not Currently    Comment: rare   Sexual activity: Not Currently     Comment: declined condoms  Other Topics Concern   Not on file  Social History Narrative   Right handed   Social Drivers of Health   Financial Resource Strain: High Risk (10/05/2022)   Overall Financial Resource Strain (CARDIA)    Difficulty of Paying Living Expenses: Hard  Food Insecurity: No Food Insecurity (09/30/2023)   Hunger Vital Sign    Worried About Running Out of Food in the Last Year: Never true    Ran Out of Food in the Last Year: Never true  Transportation Needs: No Transportation Needs (09/30/2023)   PRAPARE - Administrator, Civil Service (Medical): No    Lack of Transportation (Non-Medical): No  Physical Activity: Inactive (10/05/2022)   Exercise Vital Sign    Days of Exercise per Week: 0 days    Minutes of Exercise per Session: 0 min  Stress: Stress Concern Present (05/04/2022)   [employer] of Occupational Health - Occupational Stress Questionnaire    Feeling of Stress : To some extent  Social Connections: Moderately Isolated (09/30/2023)   Social Connection and Isolation Panel    Frequency of Communication with Friends and Family: More than three times a week    Frequency of Social Gatherings with Friends and Family: Once a week    Attends Religious Services: Never    Database administrator or Organizations: No    Attends Banker Meetings: Never    Marital Status: Married  Catering manager Violence: Not At Risk (09/30/2023)   Humiliation, Afraid, Rape, and Kick questionnaire    Fear of Current or Ex-Partner: No    Emotionally Abused: No    Physically Abused: No    Sexually Abused: No                                                                                                  Objective:  Physical Exam: BP (!) 141/83 (BP Location: Left Arm, Patient Position: Sitting, Cuff Size: Normal)   Pulse 98   Resp 18   Wt 156 lb 6.4 oz (70.9 kg)   SpO2 98%   BMI 21.21 kg/m    Physical Exam GENERAL: Alert, cooperative, well  developed, no acute distress. HEENT: Normocephalic, normal oropharynx, moist mucous membranes. CHEST: Clear to auscultation bilaterally, no wheezes, rhonchi, or crackles. CARDIOVASCULAR: Normal heart rate and rhythm, S1 and S2 normal without murmurs. ABDOMEN: Soft, non-tender, non-distended, without organomegaly, normal bowel sounds. EXTREMITIES: No cyanosis  or edema. NEUROLOGICAL: Cranial nerves grossly intact, moves all extremities without gross motor or sensory deficit.   Physical Exam  VAS US  ABI WITH/WO TBI Result Date: 10/09/2023  LOWER EXTREMITY DOPPLER STUDY Patient Name:  RYDGE TEXIDOR  Date of Exam:   10/08/2023 Medical Rec #: [id number]        Accession #:    [id number] Date of Birth: 04/16/1955        Patient Gender: M Patient Age:   63 years Exam Location:  [address] Procedure:      VAS US  ABI WITH/WO TBI Referring Phys: DONNICE FEES --------------------------------------------------------------------------------  Indications: Claudication, and peripheral artery disease. High Risk Factors: Hypertension, hyperlipidemia, current smoker, coronary artery                    disease.  Comparison Study: 09/05/22 Performing Technologist: Garnette Rockers  Examination Guidelines: A complete evaluation includes at minimum, Doppler waveform signals and systolic blood pressure reading at the level of bilateral brachial, anterior tibial, and posterior tibial arteries, when vessel segments are accessible. Bilateral testing is considered an integral part of a complete examination. Photoelectric Plethysmograph (PPG) waveforms and toe systolic pressure readings are included as required and additional duplex testing as needed. Limited examinations for reoccurring indications may be performed as noted.  ABI Findings: +---------+------------------+-----+-------------------+--------+ Right    Rt Pressure (mmHg)IndexWaveform           Comment   +---------+------------------+-----+-------------------+--------+ Brachial 178                                                +---------+------------------+-----+-------------------+--------+ PTA      133               0.74 monophasic                  +---------+------------------+-----+-------------------+--------+ DP       117               0.65 dampened monophasic         +---------+------------------+-----+-------------------+--------+ Great Toe65                0.36 Abnormal                    +---------+------------------+-----+-------------------+--------+ +---------+------------------+-----+----------+-------+ Left     Lt Pressure (mmHg)IndexWaveform  Comment +---------+------------------+-----+----------+-------+ Brachial 180                                      +---------+------------------+-----+----------+-------+ PTA      126               0.70 monophasic        +---------+------------------+-----+----------+-------+ DP       124               0.69 monophasic        +---------+------------------+-----+----------+-------+ Great Toe69                0.38 Abnormal          +---------+------------------+-----+----------+-------+ +-------+-----------+-----------+------------+------------+ ABI/TBIToday's ABIToday's TBIPrevious ABIPrevious TBI +-------+-----------+-----------+------------+------------+ Right  0.74       0.36       0.86        0.48         +-------+-----------+-----------+------------+------------+ Left  0.7        0.38       0.86        0.52         +-------+-----------+-----------+------------+------------+ TOES Findings: +----------+---------------+--------+-------+ Right ToesPressure (mmHg)WaveformComment +----------+---------------+--------+-------+ 1st Digit                Abnormal        +----------+---------------+--------+-------+ 2nd Digit                Abnormal         +----------+---------------+--------+-------+ 3rd Digit                Abnormal        +----------+---------------+--------+-------+ 4th Digit                Abnormal        +----------+---------------+--------+-------+ 5th Digit                Abnormal        +----------+---------------+--------+-------+  +---------+---------------+--------+-------+ Left ToesPressure (mmHg)WaveformComment +---------+---------------+--------+-------+ 1st Digit               Abnormal        +---------+---------------+--------+-------+ 2nd Digit               Abnormal        +---------+---------------+--------+-------+ 3rd Digit               Abnormal        +---------+---------------+--------+-------+ 4th Digit               Abnormal        +---------+---------------+--------+-------+ 5th Digit               Abnormal        +---------+---------------+--------+-------+   Bilateral ABIs and TBIs appear decreased compared to prior study on 09/05/22.  Summary: Right: Resting right ankle-brachial index indicates moderate right lower extremity arterial disease. Left: Resting left ankle-brachial index indicates moderate left lower extremity arterial disease. *See table(s) above for measurements and observations.  Electronically signed by Lonni Gaskins MD on 10/09/2023 at 8:36:29 AM.    Final    CT Angio Chest PE W and/or Wo Contrast Result Date: 09/30/2023 CLINICAL DATA:  Shortness of breath EXAM: CT ANGIOGRAPHY CHEST WITH CONTRAST TECHNIQUE: Multidetector CT imaging of the chest was performed using the standard protocol during bolus administration of intravenous contrast. Multiplanar CT image reconstructions and MIPs were obtained to evaluate the vascular anatomy. RADIATION DOSE REDUCTION: This exam was performed according to the departmental dose-optimization program which includes automated exposure control, adjustment of the mA and/or kV according to patient size and/or use of iterative  reconstruction technique. CONTRAST:  65mL OMNIPAQUE  IOHEXOL  350 MG/ML SOLN COMPARISON:  Chest x-ray 09/29/2023, chest 05/25/2023, PET CT 06/29/2023, 03/07/2023 FINDINGS: Cardiovascular: Satisfactory opacification of the pulmonary arteries to the segmental level. No evidence of pulmonary embolism. Mild aortic atherosclerosis. No aneurysm or dissection. Normal cardiac size. No pericardial effusion Mediastinum/Nodes: Patent trachea. No thyroid  mass. No suspicious lymph nodes. Esophagus within normal limits. Lungs/Pleura: Emphysema. No acute airspace disease, pleural effusion, or pneumothorax. 4 mm right upper lobe pulmonary nodule on series 6, image 46, slightly decreased in size. Ground-glass nodule adjacent to the right minor fissure measures about 6.6 mm on series 6 image 77, previously 7 mm. Right lower lobe slightly spiculated adjacent pulmonary nodules measuring 8 mm on series 6, image 95 and 9 mm. Part solid adjacent pulmonary nodule measuring 14 mm on series 6, image 97 with  the solid component measuring about 8 mm, previously 14 mm and 5 mm respectively. Right lower lobe nodules are decreased in size compared with interval PET CT from 06/29/2023. Upper Abdomen: No acute finding Musculoskeletal: No acute or suspicious osseous abnormality. Review of the MIP images confirms the above findings. IMPRESSION: 1. Negative for acute pulmonary embolus or aortic dissection. 2. Emphysema. No acute airspace disease. 3. Multiple right-sided pulmonary nodules as described above. The 2 most suspicious nodules in the right lower lobe show interval decrease in size compared with PET CT from 06/29/2023 but appear slightly increased compared with the most recent chest CT from April 2025 and appear more solid with spiculated morphology. Continued close imaging follow-up is recommended as it is uncertain if these nodules are inflammatory or neoplastic in etiology. 4. Aortic atherosclerosis. Aortic Atherosclerosis (ICD10-I70.0) and  Emphysema (ICD10-J43.9). Electronically Signed   By: Luke Bun M.D.   On: 09/30/2023 00:43   DG Chest Port 1 View Result Date: 09/29/2023 CLINICAL DATA:  Shortness of breath, respiratory distress, COPD EXAM: PORTABLE CHEST 1 VIEW COMPARISON:  06/11/2023 FINDINGS: The heart size and mediastinal contours are within normal limits. Both lungs are clear. The visualized skeletal structures are unremarkable. IMPRESSION: No active disease. Electronically Signed   By: Franky Crease M.D.   On: 09/29/2023 21:59   VAS US  RENAL ARTERY DUPLEX Result Date: 08/13/2023 ABDOMINAL VISCERAL Patient Name:  LYMAN BALINGIT  Date of Exam:   08/13/2023 Medical Rec #: [id number]        Accession #:    [id number] Date of Birth: April 21, 1955        Patient Gender: M Patient Age:   65 years Exam Location:  [address] Procedure:      VAS US  RENAL ARTERY DUPLEX Referring Phys: [id number] RECHE RAMAN WALKER -------------------------------------------------------------------------------- Indications: Hypertension. Patient denies any abdominal pain. High Risk Factors: Hypertension, current smoker. Other Factors: COPD. Comparison Study: In 04/2016, a renal artery duplex performed at [facility]                   Imaging showed no evidence of stenosis in the bilateral renal                   arteries. Performing Technologist: Nanetta Shad RVT  Examination Guidelines: A complete evaluation includes B-mode imaging, spectral Doppler, color Doppler, and power Doppler as needed of all accessible portions of each vessel. Bilateral testing is considered an integral part of a complete examination. Limited examinations for reoccurring indications may be performed as noted.  Duplex Findings: +--------------------+--------+--------+------+----------------------+ Mesenteric          PSV cm/sEDV cm/sPlaque       Comments        +--------------------+--------+--------+------+----------------------+ Aorta Prox             78                 2.2 cm  AP x 2.2 cm TRV +--------------------+--------+--------+------+----------------------+ Aorta Distal           54                                        +--------------------+--------+--------+------+----------------------+ Celiac Artery Origin  212                                        +--------------------+--------+--------+------+----------------------+  SMA Proximal          120      17                                +--------------------+--------+--------+------+----------------------+  +------------------+--------+--------+-------+ Right Renal ArteryPSV cm/sEDV cm/sComment +------------------+--------+--------+-------+ Origin               50      14           +------------------+--------+--------+-------+ Proximal             85      20           +------------------+--------+--------+-------+ Mid                  80      22           +------------------+--------+--------+-------+ Distal               44      12           +------------------+--------+--------+-------+ +-----------------+--------+--------+-------+ Left Renal ArteryPSV cm/sEDV cm/sComment +-----------------+--------+--------+-------+ Origin              66      16           +-----------------+--------+--------+-------+ Proximal            77      23           +-----------------+--------+--------+-------+ Mid                135      29           +-----------------+--------+--------+-------+ Distal             109      28           +-----------------+--------+--------+-------+ +------------+--------+--------+----+-----------+--------+--------+----+ Right KidneyPSV cm/sEDV cm/sRI  Left KidneyPSV cm/sEDV cm/sRI   +------------+--------+--------+----+-----------+--------+--------+----+ Upper Pole  19      7       0.66Upper Pole 10      4       0.66 +------------+--------+--------+----+-----------+--------+--------+----+ Mid         19      6       0.         17      5       0.69 +------------+--------+--------+----+-----------+--------+--------+----+ Lower Pole  17      7       0.60Lower Pole 13      5       0.59 +------------+--------+--------+----+-----------+--------+--------+----+ Hilar       67      23      0.66Hilar      73      22      0.70 +------------+--------+--------+----+-----------+--------+--------+----+ +------------------+------+------------------+------+ Right Kidney            Left Kidney              +------------------+------+------------------+------+ RAR                     RAR                      +------------------+------+------------------+------+ RAR (manual)      1.09  RAR (manual)      1.73   +------------------+------+------------------+------+ Cortex            .79 cmCortex  1.0 cm +------------------+------+------------------+------+ Cortex thickness        Corex thickness          +------------------+------+------------------+------+ Kidney length (cm)9.67  Kidney length (cm)10.22  +------------------+------+------------------+------+  Summary: Largest Aortic Diameter: 2.2 cm  Renal:  Right: Normal size right kidney. Normal right Resistive Index.        Normal cortical thickness of right kidney. No evidence of        right renal artery stenosis. RRV flow present. Left:  Normal size of left kidney. Normal left Resistive Index.        Normal cortical thickness of the left kidney. No evidence of        left renal artery stenosis. LRV flow present. Mesenteric: Normal Superior Mesenteric artery findings. 70 to 99% stenosis in the celiac artery, low end range.  Patent IVC.  *See table(s) above for measurements and observations.  Diagnosing physician: Gaile New MD  Electronically signed by Gaile New MD on 08/13/2023 at 3:05:15 PM.    Final    ECHOCARDIOGRAM COMPLETE Result Date: 08/03/2023    ECHOCARDIOGRAM REPORT   Patient Name:   WRIGLEY PLASENCIA Date of Exam: 08/03/2023  Medical Rec #:  [id number]       Height:       72.0 in Accession #:    [id number]      Weight:       154.1 lb Date of Birth:  09/28/1955       BSA:          1.907 m Patient Age:    67 years        BP:           139/79 mmHg Patient Gender: M               HR:           54 bpm. Exam Location:  Inpatient Procedure: 2D Echo, Cardiac Doppler, Color Doppler and Strain Analysis (Both            Spectral and Color Flow Doppler were utilized during procedure). Indications:    R94.31 Abnormal EKG  History:        Patient has prior history of Echocardiogram examinations, most                 recent 12/27/2021. CAD, Abnormal ECG, COPD, Arrythmias:Atrial                 Fibrillation, Signs/Symptoms:Shortness of Breath, Dyspnea and                 Chest Pain; Risk Factors:Current Smoker, Dyslipidemia and Sleep                 Apnea.  Sonographer:    Ellouise Mose RDCS Referring Phys: [id number] NKEIRUKA J EZENDUKA IMPRESSIONS  1. Left ventricular ejection fraction, by estimation, is 60 to 65%. The left ventricle has normal function. The left ventricle has no regional wall motion abnormalities. There is moderate left ventricular hypertrophy. Left ventricular diastolic parameters are consistent with Grade I diastolic dysfunction (impaired relaxation). The average left ventricular global longitudinal strain is -19.2 %. The global longitudinal strain is normal.  2. Right ventricular systolic function is normal. The right ventricular size is normal. Tricuspid regurgitation signal is inadequate for assessing PA pressure.  3. Left atrial size was mildly dilated.  4. The mitral valve is normal in structure. No evidence of mitral valve regurgitation. No evidence of mitral stenosis.  5. The aortic valve is  tricuspid. There is mild calcification of the aortic valve. Aortic valve regurgitation is not visualized. Aortic valve sclerosis is present, with no evidence of aortic valve stenosis.  6. Aortic dilatation noted. There is mild dilatation of the  ascending aorta, measuring 39 mm. FINDINGS  Left Ventricle: Left ventricular ejection fraction, by estimation, is 60 to 65%. The left ventricle has normal function. The left ventricle has no regional wall motion abnormalities. The average left ventricular global longitudinal strain is -19.2 %. Strain was performed and the global longitudinal strain is normal. The left ventricular internal cavity size was normal in size. There is moderate left ventricular hypertrophy. Left ventricular diastolic parameters are consistent with Grade I diastolic dysfunction (impaired relaxation). Right Ventricle: The right ventricular size is normal. No increase in right ventricular wall thickness. Right ventricular systolic function is normal. Tricuspid regurgitation signal is inadequate for assessing PA pressure. Left Atrium: Left atrial size was mildly dilated. Right Atrium: Right atrial size was normal in size. Pericardium: There is no evidence of pericardial effusion. Mitral Valve: The mitral valve is normal in structure. No evidence of mitral valve regurgitation. No evidence of mitral valve stenosis. Tricuspid Valve: The tricuspid valve is normal in structure. Tricuspid valve regurgitation is trivial. No evidence of tricuspid stenosis. Aortic Valve: The aortic valve is tricuspid. There is mild calcification of the aortic valve. Aortic valve regurgitation is not visualized. Aortic valve sclerosis is present, with no evidence of aortic valve stenosis. Pulmonic Valve: The pulmonic valve was normal in structure. Pulmonic valve regurgitation is trivial. No evidence of pulmonic stenosis. Aorta: Aortic dilatation noted. There is mild dilatation of the ascending aorta, measuring 39 mm. IAS/Shunts: No atrial level shunt detected by color flow Doppler. Additional Comments: 3D was performed not requiring image post processing on an independent workstation and was indeterminate.  LEFT VENTRICLE PLAX 2D LVIDd:         4.70 cm      Diastology  LVIDs:         2.95 cm      LV e' medial:    5.11 cm/s LV PW:         1.50 cm      LV E/e' medial:  11.7 LV IVS:        1.60 cm      LV e' lateral:   5.44 cm/s LVOT diam:     2.40 cm      LV E/e' lateral: 11.0 LV SV:         106 LV SV Index:   56           2D Longitudinal Strain LVOT Area:     4.52 cm     2D Strain GLS Avg:     -19.2 %  LV Volumes (MOD) LV vol d, MOD A2C: 144.0 ml LV vol d, MOD A4C: 140.0 ml LV vol s, MOD A2C: 60.6 ml LV vol s, MOD A4C: 54.7 ml LV SV MOD A2C:     83.4 ml LV SV MOD A4C:     140.0 ml LV SV MOD BP:      83.5 ml RIGHT VENTRICLE            IVC RV S prime:     9.14 cm/s  IVC diam: 1.80 cm TAPSE (M-mode): 2.3 cm LEFT ATRIUM             Index        RIGHT ATRIUM  Index LA diam:        4.10 cm 2.15 cm/m   RA Area:     12.60 cm LA Vol (A2C):   58.2 ml 30.52 ml/m  RA Volume:   27.80 ml  14.58 ml/m LA Vol (A4C):   40.1 ml 21.03 ml/m LA Biplane Vol: 52.4 ml 27.48 ml/m  AORTIC VALVE LVOT Vmax:   110.00 cm/s LVOT Vmean:  65.700 cm/s LVOT VTI:    0.235 m  AORTA Ao Root diam: 3.90 cm Ao Asc diam:  3.90 cm MITRAL VALVE MV Area (PHT): 2.24 cm    SHUNTS MV Decel Time: 338 msec    Systemic VTI:  0.24 m MV E velocity: 59.80 cm/s  Systemic Diam: 2.40 cm MV A velocity: 88.70 cm/s MV E/A ratio:  0.67 Maude Emmer MD Electronically signed by Maude Emmer MD Signature Date/Time: 08/03/2023/11:26:40 AM    Final    US  RENAL Result Date: 08/02/2023 CLINICAL DATA:  Acute kidney insufficiency EXAM: RENAL / URINARY TRACT ULTRASOUND COMPLETE COMPARISON:  Ultrasound 03/09/2023.  MRI 04/11/2023 FINDINGS: Right Kidney: Renal measurements: 8.5 x 4.1 x 4.5 cm = volume: 81.9 mL. Diffusely echogenic renal parenchyma. No collecting system dilatation or perinephric fluid. Left Kidney: Renal measurements: 8.4 x 5.3 x 4.8 cm = volume: 111.1 mL. Echogenic renal parenchyma. No collecting system dilatation or perinephric fluid. Lesion seen by prior MRI along the lower pole left kidney is not well seen on this  ultrasound. This could be technical. Bladder: Appears normal for degree of bladder distention. Other: None. IMPRESSION: Echogenic appearing kidneys. Please correlate for any renal dysfunction. No collecting system dilatation. Electronically Signed   By: Ranell Bring M.D.   On: 08/02/2023 09:00   MR CERVICAL SPINE WO CONTRAST Result Date: 08/01/2023 CLINICAL DATA:  weakness EXAM: MRI CERVICAL SPINE WITHOUT CONTRAST TECHNIQUE: Multiplanar, multisequence MR imaging of the cervical spine was performed. No intravenous contrast was administered. COMPARISON:  None Available. FINDINGS: Alignment: Anatomic. Vertebrae: Normal in height and signal intensity. No osseous lesions are present. Cord: Normal in morphology and signal intensity. Posterior Fossa, vertebral arteries, paraspinal tissues: Unremarkable. Disc levels: C2-3: Normal. C3-4: Right-sided uncovertebral joint hypertrophy resulting in moderate right neural foraminal stenosis. There is mild central spinal canal stenosis. The left neural foramen is patent. C4-5: Normal. C5-6: Degenerative disc bulging and mild uncovertebral joint hypertrophy, more pronounced on the left. Mild central spinal canal stenosis and mild-to-moderate left neural foraminal stenosis. C6-7: Mild right paracentral disc bulging with mild central spinal canal stenosis. The neural foramina are patent. C7-T1: Normal. IMPRESSION: 1. Multilevel degenerative disc disease resulting in varying spinal canal and neural foraminal stenosis, but no apparent spinal cord or nerve root impingement. Electronically Signed   By: Evalene Coho M.D.   On: 08/01/2023 16:37   MR BRAIN WO CONTRAST Result Date: 08/01/2023 CLINICAL DATA:  Stroke, follow up eval for mets EXAM: MRI HEAD WITHOUT CONTRAST TECHNIQUE: Multiplanar, multiecho pulse sequences of the brain and surrounding structures were obtained without intravenous contrast. COMPARISON:  CT of the head dated August 01, 2023 and MRI of the brain dated  October 25, 2022. FINDINGS: Brain: Mild generalized cerebral volume loss. Extensive periventricular and deep cerebral white matter disease. No evidence of restricted diffusion to indicate acute or recent infarction. No findings suspicious for metastatic disease, although the study is significantly limited by the absence of intravenous contrast. Vascular: Normal vascular flow voids. Skull and upper cervical spine: Normal marrow signal. No osseous lesion is evident. Sinuses/Orbits: Normal orbits.  Minimal paranasal sinus disease. Other: None. IMPRESSION: 1. Age-related atrophy and moderately advanced diffuse cerebral white matter disease. 2. Technically limited study for the assessment of metastatic disease. There are no suspicious findings present. Electronically Signed   By: Evalene Coho M.D.   On: 08/01/2023 16:31   CT ANGIO HEAD NECK W WO CM (CODE STROKE) Result Date: 08/01/2023 CLINICAL DATA:  Acute neurological deficit.  Stroke suspected. EXAM: CT ANGIOGRAPHY HEAD AND NECK WITH AND WITHOUT CONTRAST TECHNIQUE: Multidetector CT imaging of the head and neck was performed using the standard protocol during bolus administration of intravenous contrast. Multiplanar CT image reconstructions and MIPs were obtained to evaluate the vascular anatomy. Carotid stenosis measurements (when applicable) are obtained utilizing NASCET criteria, using the distal internal carotid diameter as the denominator. RADIATION DOSE REDUCTION: This exam was performed according to the departmental dose-optimization program which includes automated exposure control, adjustment of the mA and/or kV according to patient size and/or use of iterative reconstruction technique. CONTRAST:  60mL OMNIPAQUE  IOHEXOL  350 MG/ML SOLN COMPARISON:  CT angiogram of the head and neck dated October 25, 2022. FINDINGS: CT HEAD FINDINGS Brain: Age-related atrophy and mild to moderate cerebral white matter disease. No evidence of hemorrhage, mass,  cortical infarct or hydrocephalus. Vascular: No acute process. Skull: Intact and unremarkable. Sinuses/Orbits: Mild mucosal disease.  Normal orbits. Other: None. Review of the MIP images confirms the above findings CTA NECK FINDINGS Aortic arch: Negative to the extent demonstrated. Right carotid system: Mild circumferential soft plaque in the carotid bulb and origin of the internal carotid artery, with no flow limiting stenosis. Tortuous cervical segment of the right internal carotid artery, which is otherwise widely patent. Left carotid system: Normal in caliber. Mild tortuosity of the cervical segment of the left internal carotid artery. Vertebral arteries: Normal in caliber and widely patent. Skeleton: Negative. Other neck: Unremarkable. Upper chest: Mild emphysema.  The visualized lungs are clear. Review of the MIP images confirms the above findings CTA HEAD FINDINGS Anterior circulation: The internal carotid arteries, anterior cerebral arteries and middle cerebral arteries are widely patent and normal in caliber. No evidence of aneurysm or flow-limiting stenosis. Posterior circulation: No significant stenosis, proximal occlusion, aneurysm, or vascular malformation. Venous sinuses: Patent and unremarkable. Anatomic variants: None. Review of the MIP images confirms the above findings IMPRESSION: 1. Mild soft plaque within the right carotid bulb and origin of the right internal carotid artery, with less than 20% luminal stenosis. 2. Normal CT angiogram of the head. These results were called by telephone at the time of interpretation on 08/01/2023 at 1:53 pm to provider DEVON SHAFER , who verbally acknowledged these results. Electronically Signed   By: Evalene Coho M.D.   On: 08/01/2023 14:05   CT HEAD CODE STROKE WO CONTRAST Result Date: 08/01/2023 CLINICAL DATA:  Code stroke. EXAM: CT HEAD WITHOUT CONTRAST TECHNIQUE: Contiguous axial images were obtained from the base of the skull through the vertex  without intravenous contrast. RADIATION DOSE REDUCTION: This exam was performed according to the departmental dose-optimization program which includes automated exposure control, adjustment of the mA and/or kV according to patient size and/or use of iterative reconstruction technique. COMPARISON:  CT of the head dated June 05, 2023. FINDINGS: Brain: Mild-to-moderate subcortical white matter disease. No evidence of hemorrhage, mass, acute cortical infarct or hydrocephalus. Vascular: No hyperdense vessel or unexpected calcification. Skull: Intact and unremarkable. Sinuses/Orbits: Negative. Other: None. ASPECTS [facility] Stroke Program Early CT Score) - Ganglionic level infarction (caudate, lentiform nuclei, internal capsule, insula, M1-M3  cortex): 7 - Supraganglionic infarction (M4-M6 cortex): 3 Total score (0-10 with 10 being normal): 10 IMPRESSION: 1. Mild to moderate cerebral white matter disease. No apparent acute process. 2. ASPECTS is 10. 3. These results were called by telephone at the time of interpretation on 08/01/2023 at 1:53 pm to provider JAYSON PEREYRA , who verbally acknowledged these results. Electronically Signed   By: Evalene Coho M.D.   On: 08/01/2023 13:55    Recent Results (from the past 2160 hours)  Protime-INR     Status: None   Collection Time: 08/01/23  1:19 PM  Result Value Ref Range   Prothrombin Time 14.6 11.4 - 15.2 seconds   INR 1.1 0.8 - 1.2    Comment: (NOTE) INR goal varies based on device and disease states. Performed at [facility], [address] [place], [place] [address]   APTT     Status: None   Collection Time: 08/01/23  1:19 PM  Result Value Ref Range   aPTT 34 24 - 36 seconds    Comment: Performed at [facility], [address] [place], [place] [address]  CBC     Status: Abnormal   Collection Time: 08/01/23  1:19 PM  Result Value Ref Range   WBC 7.4 4.0 - 10.5 K/uL   RBC 3.89 (L) 4.22 - 5.81 MIL/uL   Hemoglobin 12.6 (L) 13.0 - 17.0 g/dL    HCT 62.6 (L) 60.9 - 52.0 %   MCV 95.9 80.0 - 100.0 fL   MCH 32.4 26.0 - 34.0 pg   MCHC 33.8 30.0 - 36.0 g/dL   RDW 85.3 88.4 - 84.4 %   Platelets 267 150 - 400 K/uL   nRBC 0.0 0.0 - 0.2 %    Comment: Performed at [facility], [address] [place], [place] [address]  Differential     Status: Abnormal   Collection Time: 08/01/23  1:19 PM  Result Value Ref Range   Neutrophils Relative % 64 %   Neutro Abs 4.8 1.7 - 7.7 K/uL   Lymphocytes Relative 21 %   Lymphs Abs 1.5 0.7 - 4.0 K/uL   Monocytes Relative 6 %   Monocytes Absolute 0.5 0.1 - 1.0 K/uL   Eosinophils Relative 8 %   Eosinophils Absolute 0.6 (H) 0.0 - 0.5 K/uL   Basophils Relative 1 %   Basophils Absolute 0.0 0.0 - 0.1 K/uL   Immature Granulocytes 0 %   Abs Immature Granulocytes 0.01 0.00 - 0.07 K/uL    Comment: Performed at [facility], [address], [place], [place] [address]  Comprehensive metabolic panel     Status: Abnormal   Collection Time: 08/01/23  1:19 PM  Result Value Ref Range   Sodium 141 135 - 145 mmol/L   Potassium 4.7 3.5 - 5.1 mmol/L   Chloride 115 (H) 98 - 111 mmol/L   CO2 20 (L) 22 - 32 mmol/L   Glucose, Bld 98 70 - 99 mg/dL    Comment: Glucose reference range applies only to samples taken after fasting for at least 8 hours.   BUN 19 8 - 23 mg/dL   Creatinine, Ser 7.51 (H) 0.61 - 1.24 mg/dL   Calcium  9.3 8.9 - 10.3 mg/dL   Total Protein 7.5 6.5 - 8.1 g/dL   Albumin  3.7 3.5 - 5.0 g/dL   AST 21 15 - 41 U/L   ALT 16 0 - 44 U/L   Alkaline Phosphatase 65 38 - 126  U/L   Total Bilirubin 0.9 0.0 - 1.2 mg/dL   GFR, Estimated 28 (L) >60 mL/min    Comment: (NOTE) Calculated using the CKD-EPI Creatinine Equation (2021)    Anion gap 6 5 - 15    Comment: Performed at [facility], [address] [place], [place] [address]  Ethanol     Status: None   Collection Time: 08/01/23  1:19 PM  Result Value Ref Range   Alcohol, Ethyl (B) <15 <15 mg/dL    Comment: (NOTE) For medical  purposes only. Performed at [facility], [address], [place], [place] [address]   Creatinine, serum     Status: Abnormal   Collection Time: 08/01/23  1:19 PM  Result Value Ref Range   Creatinine, Ser 2.57 (H) 0.61 - 1.24 mg/dL   GFR, Estimated 27 (L) >60 mL/min    Comment: (NOTE) Calculated using the CKD-EPI Creatinine Equation (2021) Performed at [facility], [address] [place], [place] [address]   I-stat chem 8, ED     Status: Abnormal   Collection Time: 08/01/23  1:30 PM  Result Value Ref Range   Sodium 143 135 - 145 mmol/L   Potassium 4.9 3.5 - 5.1 mmol/L   Chloride 115 (H) 98 - 111 mmol/L   BUN 24 (H) 8 - 23 mg/dL   Creatinine, Ser 7.49 (H) 0.61 - 1.24 mg/dL   Glucose, Bld 93 70 - 99 mg/dL    Comment: Glucose reference range applies only to samples taken after fasting for at least 8 hours.   Calcium , Ion 1.18 1.15 - 1.40 mmol/L   TCO2 19 (L) 22 - 32 mmol/L   Hemoglobin 12.9 (L) 13.0 - 17.0 g/dL   HCT 61.9 (L) 60.9 - 47.9 %  CBG monitoring, ED     Status: None   Collection Time: 08/01/23  1:52 PM  Result Value Ref Range   Glucose-Capillary 86 70 - 99 mg/dL    Comment: Glucose reference range applies only to samples taken after fasting for at least 8 hours.   Comment 1 Notify RN   Basic metabolic panel with GFR     Status: Abnormal   Collection Time: 08/02/23  3:44 AM  Result Value Ref Range   Sodium 136 135 - 145 mmol/L   Potassium 4.1 3.5 - 5.1 mmol/L   Chloride 112 (H) 98 - 111 mmol/L   CO2 20 (L) 22 - 32 mmol/L   Glucose, Bld 121 (H) 70 - 99 mg/dL    Comment: Glucose reference range applies only to samples taken after fasting for at least 8 hours.   BUN 24 (H) 8 - 23 mg/dL   Creatinine, Ser 7.27 (H) 0.61 - 1.24 mg/dL   Calcium  8.4 (L) 8.9 - 10.3 mg/dL   GFR, Estimated 25 (L) >60 mL/min    Comment: (NOTE) Calculated using the CKD-EPI Creatinine Equation (2021)    Anion gap 4 (L) 5 - 15    Comment: Performed at [facility],  [address] [place], [place] [address]  CBC     Status: Abnormal   Collection Time: 08/02/23  3:44 AM  Result Value Ref Range   WBC 8.8 4.0 - 10.5 K/uL   RBC 3.34 (L) 4.22 - 5.81 MIL/uL   Hemoglobin 10.7 (L) 13.0 - 17.0 g/dL   HCT 68.4 (L) 60.9 - 47.9 %   MCV 94.3 80.0 - 100.0 fL   MCH 32.0 26.0 - 34.0 pg  MCHC 34.0 30.0 - 36.0 g/dL   RDW 85.4 88.4 - 84.4 %   Platelets 244 150 - 400 K/uL   nRBC 0.0 0.0 - 0.2 %    Comment: Performed at [facility], [address] [place], [place] [address]  CBC     Status: Abnormal   Collection Time: 08/03/23  3:44 AM  Result Value Ref Range   WBC 6.7 4.0 - 10.5 K/uL   RBC 3.45 (L) 4.22 - 5.81 MIL/uL   Hemoglobin 11.1 (L) 13.0 - 17.0 g/dL   HCT 67.1 (L) 60.9 - 47.9 %   MCV 95.1 80.0 - 100.0 fL   MCH 32.2 26.0 - 34.0 pg   MCHC 33.8 30.0 - 36.0 g/dL   RDW 85.6 88.4 - 84.4 %   Platelets 238 150 - 400 K/uL   nRBC 0.0 0.0 - 0.2 %    Comment: Performed at [facility], [address] [place], [place] [address]  Basic metabolic panel with GFR     Status: Abnormal   Collection Time: 08/03/23  3:44 AM  Result Value Ref Range   Sodium 141 135 - 145 mmol/L   Potassium 4.0 3.5 - 5.1 mmol/L   Chloride 113 (H) 98 - 111 mmol/L   CO2 20 (L) 22 - 32 mmol/L   Glucose, Bld 87 70 - 99 mg/dL    Comment: Glucose reference range applies only to samples taken after fasting for at least 8 hours.   BUN 25 (H) 8 - 23 mg/dL   Creatinine, Ser 7.68 (H) 0.61 - 1.24 mg/dL   Calcium  8.7 (L) 8.9 - 10.3 mg/dL   GFR, Estimated 31 (L) >60 mL/min    Comment: (NOTE) Calculated using the CKD-EPI Creatinine Equation (2021)    Anion gap 8 5 - 15    Comment: Performed at [facility], [address], [place], [place] [address]  ECHOCARDIOGRAM COMPLETE     Status: None   Collection Time: 08/03/23 10:33 AM  Result Value Ref Range   Weight 2,465.6 oz   Height 72 in   BP 139/79 mmHg   Single Plane A2C EF 57.9 %   Single Plane A4C EF 60.9 %   Calc EF 58.5  %   S' Lateral 2.95 cm   Area-P 1/2 2.24 cm2   Est EF 60 - 65%   Urine cytology ancillary only     Status: None   Collection Time: 08/31/23  9:10 AM  Result Value Ref Range   Neisseria Gonorrhea Negative    Chlamydia Negative    Comment Normal Reference Ranger Chlamydia - Negative    Comment      Normal Reference Range Neisseria Gonorrhea - Negative  HIV RNA, RTPCR W/R GT (RTI, PI,INT)     Status: None   Collection Time: 08/31/23  9:28 AM  Result Value Ref Range   HIV 1 RNA Quant NOT DETECTED copies/mL   HIV-1 RNA Quant, Log NOT DETECTED Log copies/mL    Comment: REFERENCE RANGE:           NOT DETECTED  copies/mL           NOT DETECTED  Log copies/mL . This test was performed using Real-Time Polymerase Chain Reaction. . Reportable range is 20 to 10,000,000 copies/mL (1.30-7.00 Log copies/mL).   RPR     Status: None   Collection Time: 08/31/23  9:28 AM  Result Value Ref Range   RPR Ser Ql NON-REACTIVE NON-REACTIVE  Comment: . No laboratory evidence of syphilis. If recent exposure is suspected, submit a new sample in 2-4 weeks. .   T-helper cells (CD4) count (not at [facility])     Status: None   Collection Time: 08/31/23  9:28 AM  Result Value Ref Range   CD4 T Helper % 58 30 - 61 %   Absolute CD4 843 490 - 1,740 cells/uL   Total lymphocyte count 1,459 850 - 3,900 cells/uL    Comment: The analytical performance characteristics of this assay  have been determined by [facility] Company. The  modifications have not been cleared or approved by the  FDA. This assay has been validated pursuant to the CLIA  regulations and is used for clinical purposes.   Resp panel by RT-PCR (RSV, Flu A&B, Covid) Anterior Nasal Swab     Status: None   Collection Time: 09/29/23  9:14 PM   Specimen: Anterior Nasal Swab  Result Value Ref Range   SARS Coronavirus 2 by RT PCR NEGATIVE NEGATIVE    Comment: (NOTE) SARS-CoV-2 target nucleic acids are NOT DETECTED.  The SARS-CoV-2 RNA is generally  detectable in upper respiratory specimens during the acute phase of infection. The lowest concentration of SARS-CoV-2 viral copies this assay can detect is 138 copies/mL. A negative result does not preclude SARS-Cov-2 infection and should not be used as the sole basis for treatment or other patient management decisions. A negative result may occur with  improper specimen collection/handling, submission of specimen other than nasopharyngeal swab, presence of viral mutation(s) within the areas targeted by this assay, and inadequate number of viral copies(<138 copies/mL). A negative result must be combined with clinical observations, patient history, and epidemiological information. The expected result is Negative.  Fact Sheet for Patients:  BloggerCourse.com  Fact Sheet for Healthcare Providers:  SeriousBroker.it  This test is no t yet approved or cleared by the [place]  FDA and  has been authorized for detection and/or diagnosis of SARS-CoV-2 by FDA under an Emergency Use Authorization (EUA). This EUA will remain  in effect (meaning this test can be used) for the duration of the COVID-19 declaration under Section 564(b)(1) of the Act, 21 U.S.C.section 360bbb-3(b)(1), unless the authorization is terminated  or revoked sooner.       Influenza A by PCR NEGATIVE NEGATIVE   Influenza B by PCR NEGATIVE NEGATIVE    Comment: (NOTE) The Xpert Xpress SARS-CoV-2/FLU/RSV plus assay is intended as an aid in the diagnosis of influenza from Nasopharyngeal swab specimens and should not be used as a sole basis for treatment. Nasal washings and aspirates are unacceptable for Xpert Xpress SARS-CoV-2/FLU/RSV testing.  Fact Sheet for Patients: BloggerCourse.com  Fact Sheet for Healthcare Providers: SeriousBroker.it  This test is not yet approved or cleared by the [place]  FDA and has  been authorized for detection and/or diagnosis of SARS-CoV-2 by FDA under an Emergency Use Authorization (EUA). This EUA will remain in effect (meaning this test can be used) for the duration of the COVID-19 declaration under Section 564(b)(1) of the Act, 21 U.S.C. section 360bbb-3(b)(1), unless the authorization is terminated or revoked.     Resp Syncytial Virus by PCR NEGATIVE NEGATIVE    Comment: (NOTE) Fact Sheet for Patients: BloggerCourse.com  Fact Sheet for Healthcare Providers: SeriousBroker.it  This test is not yet approved or cleared by the [place]  FDA and has been authorized for detection and/or diagnosis of SARS-CoV-2 by FDA under an Emergency Use Authorization (EUA). This EUA will remain in effect (meaning this  test can be used) for the duration of the COVID-19 declaration under Section 564(b)(1) of the Act, 21 U.S.C. section 360bbb-3(b)(1), unless the authorization is terminated or revoked.  Performed at [facility], [address] [place], [place] [address]   CBC with Differential/Platelet     Status: Abnormal   Collection Time: 09/29/23  9:14 PM  Result Value Ref Range   WBC 8.5 4.0 - 10.5 K/uL   RBC 3.44 (L) 4.22 - 5.81 MIL/uL   Hemoglobin 11.3 (L) 13.0 - 17.0 g/dL   HCT 67.2 (L) 60.9 - 47.9 %   MCV 95.1 80.0 - 100.0 fL   MCH 32.8 26.0 - 34.0 pg   MCHC 34.6 30.0 - 36.0 g/dL   RDW 86.0 88.4 - 84.4 %   Platelets 260 150 - 400 K/uL   nRBC 0.0 0.0 - 0.2 %   Neutrophils Relative % 63 %   Neutro Abs 5.4 1.7 - 7.7 K/uL   Lymphocytes Relative 18 %   Lymphs Abs 1.5 0.7 - 4.0 K/uL   Monocytes Relative 8 %   Monocytes Absolute 0.7 0.1 - 1.0 K/uL   Eosinophils Relative 10 %   Eosinophils Absolute 0.9 (H) 0.0 - 0.5 K/uL   Basophils Relative 0 %   Basophils Absolute 0.0 0.0 - 0.1 K/uL   Immature Granulocytes 1 %   Abs Immature Granulocytes 0.04 0.00 - 0.07 K/uL    Comment: Performed at  [facility], [address] [place], [place] [address]  Comprehensive metabolic panel     Status: Abnormal   Collection Time: 09/29/23  9:14 PM  Result Value Ref Range   Sodium 141 135 - 145 mmol/L   Potassium 3.9 3.5 - 5.1 mmol/L   Chloride 109 98 - 111 mmol/L   CO2 21 (L) 22 - 32 mmol/L   Glucose, Bld 83 70 - 99 mg/dL    Comment: Glucose reference range applies only to samples taken after fasting for at least 8 hours.   BUN 22 8 - 23 mg/dL   Creatinine, Ser 8.06 (H) 0.61 - 1.24 mg/dL   Calcium  8.9 8.9 - 10.3 mg/dL   Total Protein 7.4 6.5 - 8.1 g/dL   Albumin  4.3 3.5 - 5.0 g/dL   AST 17 15 - 41 U/L   ALT 7 0 - 44 U/L   Alkaline Phosphatase 82 38 - 126 U/L   Total Bilirubin 0.5 0.0 - 1.2 mg/dL   GFR, Estimated 38 (L) >60 mL/min    Comment: (NOTE) Calculated using the CKD-EPI Creatinine Equation (2021)    Anion gap 11 5 - 15    Comment: Performed at [facility], [address] [place], [place] [address]  Brain natriuretic peptide     Status: Abnormal   Collection Time: 09/29/23  9:14 PM  Result Value Ref Range   Pro Brain Natriuretic Peptide 450.0 (H) <300.0 pg/mL    Comment: (NOTE) Age Group        Cut-Points    Interpretation  < 50 years     450 pg/mL       NT-proBNP > 450 pg/mL indicates                                ADHF is likely              50 to 75 years  900 pg/mL      NT-proBNP > 900 pg/mL  indicates          ADHF is likely  > 75 years      1800 pg/mL     NT-proBNP > 1800 pg/mL indicates          ADHF is likely                           All ages    Results between       Indeterminate. Further clinical             300 and the cut-   information is needed to determine            point for age group   if ADHF is present.                                                             Elecsys proBNP II/ Elecsys proBNP II STAT           Cut-Point                       Interpretation  300 pg/mL                    NT-proBNP  <300pg/mL indicates                             ADHF is not likely  Performed at [facility], [address], [place], [place] [address]   Urinalysis, Routine w reflex microscopic -Urine, Clean Catch     Status: None   Collection Time: 09/29/23  9:14 PM  Result Value Ref Range   Color, Urine YELLOW YELLOW   APPearance CLEAR CLEAR   Specific Gravity, Urine 1.005 1.005 - 1.030   pH 5.5 5.0 - 8.0   Glucose, UA NEGATIVE NEGATIVE mg/dL   Hgb urine dipstick NEGATIVE NEGATIVE   Bilirubin Urine NEGATIVE NEGATIVE   Ketones, ur NEGATIVE NEGATIVE mg/dL   Protein, ur NEGATIVE NEGATIVE mg/dL   Nitrite NEGATIVE NEGATIVE   Leukocytes,Ua NEGATIVE NEGATIVE   RBC / HPF 0-5 0 - 5 RBC/hpf   WBC, UA 0-5 0 - 5 WBC/hpf   Bacteria, UA NONE SEEN NONE SEEN   Squamous Epithelial / HPF 0-5 0 - 5 /HPF    Comment: Performed at [facility], [address] [place], [place] [address]  Troponin T, High Sensitivity     Status: Abnormal   Collection Time: 09/29/23  9:14 PM  Result Value Ref Range   Troponin T High Sensitivity 32 (H) 0 - 19 ng/L    Comment: (NOTE) Biotin concentrations > 1000 ng/mL falsely decrease TnT results.  Serial cardiac troponin measurements are suggested.  Refer to the Links section for chest pain algorithms and additional  guidance. Performed at [facility], [address] [place], [place] [address]   I-Stat venous blood gas, [facility], [facility], [facility])     Status: Abnormal   Collection Time: 09/29/23  9:20 PM  Result Value Ref Range   pH, Ven 7.392 7.25 - 7.43   pCO2, Ven 40.8 (L) 44 - 60 mmHg   pO2, Ven 35 32 - 45 mmHg   Bicarbonate 24.8  20.0 - 28.0 mmol/L   TCO2 26 22 - 32 mmol/L   O2 Saturation 67 %   Acid-Base Excess 0.0 0.0 - 2.0 mmol/L   Sodium 142 135 - 145 mmol/L   Potassium 4.7 3.5 - 5.1 mmol/L   Calcium , Ion 1.19 1.15 - 1.40 mmol/L   HCT 33.0 (L) 39.0 - 52.0 %   Hemoglobin 11.2 (L) 13.0 - 17.0 g/dL   Patient  temperature 98.4 F    Collection site IV start    Drawn by Nurse    Sample type VENOUS    Comment NOTIFIED PHYSICIAN   Troponin T     Status: Abnormal   Collection Time: 09/30/23 12:39 AM  Result Value Ref Range   Troponin T (Highly Sensitive) 34 (H) 0 - 22 ng/L    Comment: (NOTE) In order to distinguish acute elevations of high sensitive Troponin from other clinical conditions, the Universal Definition of myocardial infarction stresses clinical assessment and the need for serial measurements to observe a rise and/or fall above the upper limit of the reference interval. Performed At: [facility] [place] [address] [place], [place] [id number] Jennette Shorter MD Ey:[id number]   Troponin I (High Sensitivity)     Status: Abnormal   Collection Time: 09/30/23  4:59 AM  Result Value Ref Range   Troponin I (High Sensitivity) 25 (H) <18 ng/L    Comment: (NOTE) Elevated high sensitivity troponin I (hsTnI) values and significant  changes across serial measurements may suggest ACS but many other  chronic and acute conditions are known to elevate hsTnI results.  Refer to the Links section for chest pain algorithms and additional  guidance. Performed at [facility], [address], [place], [place] [address]   VAS US  ABI WITH/WO TBI     Status: None   Collection Time: 10/08/23  4:37 PM  Result Value Ref Range   Right ABI 0.74    Left ABI 0.7   POCT glycosylated hemoglobin (Hb A1C)     Status: Normal   Collection Time: 10/19/23  3:04 PM  Result Value Ref Range   Hemoglobin A1C 5.5 4.0 - 5.6 %   HbA1c POC (<> result, manual entry)     HbA1c, POC (prediabetic range)     HbA1c, POC (controlled diabetic range)          Beverley Adine Hummer, MD, MS

## 2023-10-19 NOTE — Patient Instructions (Signed)
  VISIT SUMMARY: You came in today for a follow-up after your recent hospitalization due to a COPD exacerbation and to discuss FMLA. We reviewed your current health issues and made some adjustments to your treatment plan.  YOUR PLAN: -CHRONIC OBSTRUCTIVE PULMONARY DISEASE (COPD): COPD is a chronic lung condition that makes it hard to breathe. You recently had a flare-up that required hospitalization. Continue using your controller inhalers as prescribed.  -PERIPHERAL NEUROPATHY: Peripheral neuropathy is a condition that causes burning or tingling sensations in your feet. It can be caused by various factors including vascular disease, chemotherapy, and diabetes. We are starting you on pregabalin  (Lyrica ) 50 mg twice a day to help manage this pain. We will also check your A1c to evaluate for diabetes and follow up in one month to see how you are responding to the medication.  -LUNG CANCER: You are under surveillance for lung cancer following your radiation therapy four months ago. We will perform a CT scan to monitor your condition.  -CHRONIC KIDNEY DISEASE (CKD): CKD is a condition where your kidneys are not functioning at full capacity. Your recent kidney function tests showed reduced function. We have adjusted your pregabalin  dose to 50 mg twice a day to account for this.  -GENERALIZED ANXIETY DISORDER: Generalized anxiety disorder is a condition characterized by excessive worry. You are currently taking buspirone  for this. We will monitor your anxiety levels as you start pregabalin , which may also help manage your anxiety.  -INSOMNIA: Insomnia is difficulty sleeping. You are currently using temazepam . We are starting you on amitriptyline  10 mg at bedtime, which may also help with anxiety and neuropathy. We will evaluate your response to this medication in one month.  -GENERAL HEALTH MAINTENANCE: We discussed vaccinations for shingles and flu. Given your history and respiratory issues, it is  recommended that you receive both the shingles and flu vaccines.  INSTRUCTIONS: Please follow up in one month to assess your response to pregabalin  and amitriptyline . Additionally, make sure to get your A1c checked to evaluate for diabetes. We will also schedule a CT scan for lung cancer surveillance.

## 2023-10-22 ENCOUNTER — Other Ambulatory Visit

## 2023-10-22 ENCOUNTER — Telehealth: Payer: Self-pay

## 2023-10-22 ENCOUNTER — Ambulatory Visit (HOSPITAL_COMMUNITY)

## 2023-10-22 ENCOUNTER — Other Ambulatory Visit: Payer: Self-pay

## 2023-10-22 NOTE — Telephone Encounter (Signed)
 Called patient daughter Dwan Sakai to informed her that FMLA paperwork was faxed with confirmation left VM; hard copy was left at front desk and charge sheet placed in Roxie Castor office on 10/22/2023.

## 2023-10-23 ENCOUNTER — Other Ambulatory Visit: Payer: Self-pay

## 2023-10-23 DIAGNOSIS — M5106 Intervertebral disc disorders with myelopathy, lumbar region: Secondary | ICD-10-CM | POA: Diagnosis not present

## 2023-10-23 DIAGNOSIS — M79604 Pain in right leg: Secondary | ICD-10-CM | POA: Diagnosis not present

## 2023-10-24 ENCOUNTER — Other Ambulatory Visit: Payer: Self-pay

## 2023-10-24 ENCOUNTER — Other Ambulatory Visit (HOSPITAL_COMMUNITY): Payer: Self-pay

## 2023-10-24 ENCOUNTER — Ambulatory Visit (HOSPITAL_COMMUNITY)

## 2023-10-24 ENCOUNTER — Inpatient Hospital Stay: Attending: Internal Medicine

## 2023-10-24 DIAGNOSIS — Z79899 Other long term (current) drug therapy: Secondary | ICD-10-CM | POA: Insufficient documentation

## 2023-10-24 DIAGNOSIS — Z85118 Personal history of other malignant neoplasm of bronchus and lung: Secondary | ICD-10-CM | POA: Insufficient documentation

## 2023-10-24 DIAGNOSIS — R918 Other nonspecific abnormal finding of lung field: Secondary | ICD-10-CM | POA: Insufficient documentation

## 2023-10-24 DIAGNOSIS — Z79624 Long term (current) use of inhibitors of nucleotide synthesis: Secondary | ICD-10-CM | POA: Insufficient documentation

## 2023-10-24 DIAGNOSIS — Z7982 Long term (current) use of aspirin: Secondary | ICD-10-CM | POA: Insufficient documentation

## 2023-10-24 DIAGNOSIS — Z85528 Personal history of other malignant neoplasm of kidney: Secondary | ICD-10-CM | POA: Insufficient documentation

## 2023-10-24 MED ORDER — VITAMIN D (ERGOCALCIFEROL) 1.25 MG (50000 UNIT) PO CAPS
50000.0000 [IU] | ORAL_CAPSULE | ORAL | 0 refills | Status: DC
  Filled 2023-10-24: qty 12, 84d supply, fill #0

## 2023-10-25 DIAGNOSIS — M5106 Intervertebral disc disorders with myelopathy, lumbar region: Secondary | ICD-10-CM | POA: Diagnosis not present

## 2023-10-25 DIAGNOSIS — M79604 Pain in right leg: Secondary | ICD-10-CM | POA: Diagnosis not present

## 2023-10-28 DIAGNOSIS — M79604 Pain in right leg: Secondary | ICD-10-CM | POA: Diagnosis not present

## 2023-10-28 DIAGNOSIS — M5106 Intervertebral disc disorders with myelopathy, lumbar region: Secondary | ICD-10-CM | POA: Diagnosis not present

## 2023-10-29 ENCOUNTER — Ambulatory Visit (HOSPITAL_COMMUNITY)
Admission: RE | Admit: 2023-10-29 | Discharge: 2023-10-29 | Disposition: A | Discharge status: Home / Self Care | Source: Ambulatory Visit | Attending: Internal Medicine | Admitting: Internal Medicine

## 2023-10-29 DIAGNOSIS — C3490 Malignant neoplasm of unspecified part of unspecified bronchus or lung: Secondary | ICD-10-CM | POA: Diagnosis not present

## 2023-10-29 DIAGNOSIS — J438 Other emphysema: Secondary | ICD-10-CM | POA: Diagnosis not present

## 2023-10-31 ENCOUNTER — Other Ambulatory Visit: Payer: Self-pay

## 2023-10-31 ENCOUNTER — Ambulatory Visit: Admitting: Internal Medicine

## 2023-10-31 ENCOUNTER — Other Ambulatory Visit (HOSPITAL_COMMUNITY): Payer: Self-pay

## 2023-10-31 NOTE — Telephone Encounter (Signed)
 Copied from CRM [id number]. Topic: Medical Record Request - Other >> Oct 24, 2023  2:43 PM Arm[place] J wrote: Reason for CRM: Patient's daughter is calling to inform Dr. [name] that her employer is needing questions 6 & 7 filled out on the [facility] paperwork that was recently completed.   They are needing to know the frequency of the patient's flare ups, and hospital visits from the past 3 months (including specific dates seen). They are also requiring an initial from Dr. [name] in order to verify the updated information.  Please call [phone] [facility]) once completed and fax back to her employer. >> Oct 31, 2023 12:12 PM Chiquita SQUIBB wrote: Patients daughter is calling in again regarding the paperwork, she is getting close to the dead line and is requesting a follow up on this paperwork.  >> Oct 24, 2023  3:28 PM Terrill B wrote: Please give the patient daughter a call back about pt fmla  paperwork

## 2023-10-31 NOTE — Telephone Encounter (Signed)
 Paperwork was printed and placed in provider folder for update on 10/31/2023.

## 2023-11-01 ENCOUNTER — Other Ambulatory Visit: Payer: Self-pay

## 2023-11-05 NOTE — Telephone Encounter (Unsigned)
 Copied from CRM [id number]. Topic: Medical Record Request - Other >> Oct 24, 2023  2:43 PM [place] J wrote: Reason for CRM: Patient's daughter is calling to inform Dr. [name] that her employer is needing questions 6 & 7 filled out on the [facility] paperwork that was recently completed.   They are needing to know the frequency of the patient's flare ups, and hospital visits from the past 3 months (including specific dates seen). They are also requiring an initial from Dr. [name] in order to verify the updated information.  Please call [phone] [facility]) once completed and fax back to her employer. >> Nov 05, 2023 11:32 AM Rosina BIRCH wrote: Patient daughter called stating this is the third time of her calling regarding the FMLA paperwork for the patient. She would like an update on the paper work and the questions that need to be answered and initialed. Patient daughter need the paper work to be faxed before the 9/25 to [employer] [phone] >> Oct 31, 2023 12:12 PM Chiquita SQUIBB wrote: Patients daughter is calling in again regarding the paperwork, she is getting close to the dead line and is requesting a follow up on this paperwork.  >> Oct 24, 2023  3:28 PM Terrill B wrote: Please give the patient daughter a call back about pt fmla  paperwork

## 2023-11-05 NOTE — Telephone Encounter (Signed)
 Called and left a voice message for Kevin [name] on [phone] asking to give the office a call back at [phone]. Looking at the original paperwork faxed on 10/22/23 questions 6 & 7 are filled in. Want to ask patient's daughter if there is something else that is needing to go along with those questions to please let us  know so that the papers can be faxed back before deadline.

## 2023-11-05 NOTE — Telephone Encounter (Signed)
 Called received on CAL with daughter, Roxie, asking about FMLA paperwork. Informed Temi to relay message that paperwork was faxed, voicemail was left, and hard copy left in FO for pick up per A. Gable's note.

## 2023-11-05 NOTE — Telephone Encounter (Signed)
 Patient's daughter returned call to the office. I informed her why I called. She stated that question 6 was not answered correctly and needs to be updated to reflect all and future appointment with Dr. [name] and frequency. Dr. [name] will need to correct, initial and date the corrections and fax back to [facility] by 11/08/23

## 2023-11-06 NOTE — Telephone Encounter (Signed)
 Paperwork was updated by Dr. [name] MD on 11/06/2023 a was faxed. I also called and spoke with patient daughter Roxie Sakai; She verbalized understanding and a thank you for update.   Copy of form will be left at my desk for any further corrections that need to be made.

## 2023-11-08 ENCOUNTER — Inpatient Hospital Stay (HOSPITAL_BASED_OUTPATIENT_CLINIC_OR_DEPARTMENT_OTHER): Admitting: Internal Medicine

## 2023-11-08 VITALS — BP 154/80 | HR 57 | Temp 98.2°F | Resp 16 | Ht 72.0 in | Wt 154.0 lb

## 2023-11-08 DIAGNOSIS — Z85528 Personal history of other malignant neoplasm of kidney: Secondary | ICD-10-CM | POA: Diagnosis not present

## 2023-11-08 DIAGNOSIS — Z79624 Long term (current) use of inhibitors of nucleotide synthesis: Secondary | ICD-10-CM | POA: Diagnosis not present

## 2023-11-08 DIAGNOSIS — C3490 Malignant neoplasm of unspecified part of unspecified bronchus or lung: Secondary | ICD-10-CM | POA: Diagnosis not present

## 2023-11-08 DIAGNOSIS — R918 Other nonspecific abnormal finding of lung field: Secondary | ICD-10-CM | POA: Diagnosis not present

## 2023-11-08 DIAGNOSIS — Z79899 Other long term (current) drug therapy: Secondary | ICD-10-CM | POA: Diagnosis not present

## 2023-11-08 DIAGNOSIS — Z85118 Personal history of other malignant neoplasm of bronchus and lung: Secondary | ICD-10-CM | POA: Diagnosis not present

## 2023-11-08 DIAGNOSIS — Z0279 Encounter for issue of other medical certificate: Secondary | ICD-10-CM

## 2023-11-08 DIAGNOSIS — Z7982 Long term (current) use of aspirin: Secondary | ICD-10-CM | POA: Diagnosis not present

## 2023-11-08 NOTE — Progress Notes (Signed)
 [facility] [phone]   [phone]  OFFICE PROGRESS NOTE  Kevin Beverley NOVAK, MD [address] [place] [address]  DIAGNOSIS: Stage Ia (T1b, N0, M0) non-small cell lung cancer presented with right lower lobe lung nodule in addition to suspicious small groundglass opacity in the right upper lobe and few other tiny pulmonary nodules diagnosed in April 2025.   PRIOR THERAPY: Curative SBRT to the right upper lobe lung nodule under the care of Dr. Dewey completed on July 24, 2023  CURRENT THERAPY: Observation.  INTERVAL HISTORY: Kevin [name] 68 y.o. male returns to the clinic today for follow-up visit. Discussed the use of AI scribe software for clinical note transcription with the patient, who gave verbal consent to proceed.  History of Present Illness Kevin [name] is a 68 year old male with stage IA non-small cell lung cancer who presents for evaluation with a repeat CT scan of the chest.  He was diagnosed with stage IA non-small cell lung cancer in April 2025 and completed curative stereotactic body radiation therapy (SBRT) to the right upper lobe lung nodule on July 24, 2023. He is currently in an observation period to monitor for disease progression.  He feels 'okay' since his last visit and states that the radiation treatment was 'okay' and did not bother him. He experiences occasional breathing difficulties. No chest pain, cough, hemoptysis, unintentional weight loss, nausea, vomiting, diarrhea, or headaches. He notes a weight change of approximately six pounds.  He works as a Investment banker, operational.     MEDICAL HISTORY: Past Medical History:  Diagnosis Date   AIDS (acquired immune deficiency syndrome) (HCC) 08/17/2016   Amaurosis fugax 08/18/2022   Chronic diastolic CHF (congestive heart failure), NYHA class 3 (HCC) 01/2016   Chronic lower back pain    CKD (chronic kidney disease), stage III (HCC)    stage 3b   COPD (chronic  obstructive pulmonary disease) (HCC)    Dyspnea    Gout    forearms, hands, ankles, feet (06/05/2016)   Headache    weekly (06/05/2016)   Heart murmur    never has caused any problems per pt   History of herpes zoster virus 01/2023   Hypertension    Hypertensive crisis 08/15/2017   Lipoma 07/06/2022   Neuromuscular disorder (HCC) 02/05/2023   post herpetic neuralgia    -  Primary   OSA on CPAP    does not use CPAP   PAD (peripheral artery disease)    PAF (paroxysmal atrial fibrillation) (HCC) 01/2016   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  MEDICATIONS:  Current Outpatient Medications  Medication Sig Dispense Refill   albuterol  (VENTOLIN  HFA) 108 (90 Base) MCG/ACT inhaler Inhale 1-2 puffs into the lungs every 6 (six) hours as needed for wheezing or shortness of breath. 6.7 g 3   amitriptyline  (ELAVIL ) 10 MG tablet Take 1 tablet (10 mg total) by mouth at bedtime. 30 tablet 0   amLODipine -valsartan  (EXFORGE ) 10-320 MG tablet Take 1 tablet by mouth daily. 90 tablet 2   aspirin  EC (ASPIRIN  LOW DOSE) 81 MG tablet Take 1 tablet (81 mg total) by mouth daily, swallow whole 30 tablet 12   atorvastatin  (LIPITOR) 80 MG tablet Take 1 tablet (80 mg total) by mouth daily. 90 tablet 3   baclofen (LIORESAL) 10  MG tablet Take 10 mg by mouth 3 (three) times daily.     celecoxib (CELEBREX) 200 MG capsule Take 200 mg by mouth 2 (two) times daily as needed (for pain).     ciclopirox  (PENLAC ) 8 % solution Apply topically at bedtime. Apply over nail and surrounding skin. Apply daily over previous coat. After seven (7) days, may remove with alcohol and continue cycle. 6.6 mL 2   dolutegravir -lamiVUDine  (DOVATO ) 50-300 MG tablet Take 1 tablet by mouth daily. 30 tablet 5   Fluticasone -Umeclidin-Vilant (TRELEGY ELLIPTA ) 200-62.5-25 MCG/ACT AEPB Inhale 1 puff  into the lungs daily. 60 each 2   hydrALAZINE  (APRESOLINE ) 25 MG tablet Take 1 tablet (25 mg total) by mouth 2 (two) times daily. 180 tablet 3   hydrOXYzine  (ATARAX ) 25 MG tablet Take 25 mg by mouth 4 (four) times daily.     ipratropium-albuterol  (DUONEB) 0.5-2.5 (3) MG/3ML SOLN Take 3 mLs by nebulization every 6 (six) hours as needed. (Patient taking differently: Take 3 mLs by nebulization every 6 (six) hours as needed (for shortness of breath or wheezing).) 360 mL 12   montelukast  (SINGULAIR ) 10 MG tablet Take 1 tablet (10 mg total) by mouth at bedtime. 30 tablet 5   nebivolol  (BYSTOLIC ) 5 MG tablet Take 1 tablet (5 mg total) by mouth daily. 90 tablet 3   oxyCODONE -acetaminophen  (PERCOCET) 10-325 MG tablet Take 1 tablet by mouth 3 (three) times daily.     pregabalin  (LYRICA ) 50 MG capsule Take 1 capsule (50 mg total) by mouth 2 (two) times daily. 60 capsule 0   temazepam  (RESTORIL ) 30 MG capsule Take 1 capsule (30 mg total) by mouth at bedtime as needed for sleep. 30 capsule 5   Vitamin D , Ergocalciferol , (DRISDOL ) 1.25 MG (50000 UNIT) CAPS capsule Take 1 capsule (50,000 Units total) by mouth once a week for 16 weeks. Then recheck level in the office. 12 capsule 0   No current facility-administered medications for this visit.    SURGICAL HISTORY:  Past Surgical History:  Procedure Laterality Date   BRONCHIAL BIOPSY  06/11/2023   Procedure: BRONCHOSCOPY, WITH BIOPSY;  Surgeon: Shelah Lamar RAMAN, MD;  Location: [facility] ENDOSCOPY;  Service: Pulmonary;;   BRONCHIAL BRUSHINGS  06/11/2023   Procedure: BRONCHOSCOPY, WITH BRUSH BIOPSY;  Surgeon: Shelah Lamar RAMAN, MD;  Location: [facility] ENDOSCOPY;  Service: Pulmonary;;   BRONCHIAL NEEDLE ASPIRATION BIOPSY  06/11/2023   Procedure: BRONCHOSCOPY, WITH NEEDLE ASPIRATION BIOPSY;  Surgeon: Shelah Lamar RAMAN, MD;  Location: [facility] ENDOSCOPY;  Service: Pulmonary;;   BRONCHIAL WASHINGS  06/11/2023   Procedure: IRRIGATION, BRONCHUS;  Surgeon: Shelah Lamar RAMAN, MD;  Location: [facility]  ENDOSCOPY;  Service: Pulmonary;;   COLONOSCOPY N/A 04/24/2023   Procedure: COLONOSCOPY;  Surgeon: Rosalie Kitchens, MD;  Location: [facility] ENDOSCOPY;  Service: Gastroenterology;  Laterality: N/A;   IR RADIOLOGIST EVAL & MGMT  05/31/2021   IR RADIOLOGIST EVAL & MGMT  07/26/2021   LEFT HEART CATH AND CORONARY ANGIOGRAPHY N/A 10/23/2016   Procedure: LEFT HEART CATH AND CORONARY ANGIOGRAPHY;  Surgeon: Anner Alm ORN, MD;  Location: [facility] INVASIVE CV LAB;  Service: Cardiovascular;  Laterality: N/A;   LOWER EXTREMITY ANGIOGRAPHY N/A 07/17/2016   Procedure: Lower Extremity Angiography;  Surgeon: Court Dorn PARAS, MD;  Location: [facility] INVASIVE CV LAB;  Service: Cardiovascular;  Laterality: N/A;   LOWER EXTREMITY INTERVENTION N/A 06/05/2016   Procedure: Lower Extremity Intervention;  Surgeon: Dorn PARAS Court, MD;  Location: [facility] INVASIVE CV LAB;  Service: Cardiovascular;  Laterality: N/A;   PERIPHERAL VASCULAR ATHERECTOMY Right  07/17/2016   Procedure: Peripheral Vascular Atherectomy;  Surgeon: Court Dorn PARAS, MD;  Location: [facility] INVASIVE CV LAB;  Service: Cardiovascular;  Laterality: Right;  SFA   PERIPHERAL VASCULAR INTERVENTION  06/05/2016   Procedure: Peripheral Vascular Intervention;  Surgeon: Dorn PARAS Court, MD;  Location: [facility] INVASIVE CV LAB;  Service: Cardiovascular;;  left SFA   POLYPECTOMY  04/24/2023   Procedure: POLYPECTOMY;  Surgeon: Rosalie Kitchens, MD;  Location: [facility]OS[facility];  Service: Gastroenterology;;   RADIOLOGY WITH ANESTHESIA Right 06/29/2021   Procedure: RIGHT RENAL CRYOABLATION;  Surgeon: Philip Cornet, MD;  Location: [facility] ORS;  Service: Anesthesiology;  Laterality: Right;   VIDEO BRONCHOSCOPY WITH ENDOBRONCHIAL NAVIGATION Right 06/11/2023   Procedure: VIDEO BRONCHOSCOPY WITH ENDOBRONCHIAL NAVIGATION;  Surgeon: Shelah Lamar RAMAN, MD;  Location: [facility] ENDOSCOPY;  Service: Pulmonary;  Laterality: Right;  WITH FLUORO AND BIOPSY    REVIEW OF SYSTEMS:  A comprehensive review of systems was negative except for: Respiratory:  positive for cough and dyspnea on exertion   PHYSICAL EXAMINATION: General appearance: alert, cooperative, fatigued, and no distress Head: Normocephalic, without obvious abnormality, atraumatic Neck: no adenopathy, no JVD, supple, symmetrical, trachea midline, and thyroid  not enlarged, symmetric, no tenderness/mass/nodules Lymph nodes: Cervical, supraclavicular, and axillary nodes normal. Resp: clear to auscultation bilaterally Back: symmetric, no curvature. ROM normal. No CVA tenderness. Cardio: regular rate and rhythm, S1, S2 normal, no murmur, click, rub or gallop GI: soft, non-tender; bowel sounds normal; no masses,  no organomegaly Extremities: extremities normal, atraumatic, no cyanosis or edema  ECOG PERFORMANCE STATUS: 1 - Symptomatic but completely ambulatory  There were no vitals taken for this visit.  LABORATORY DATA: Lab Results  Component Value Date   WBC 8.5 09/29/2023   HGB 11.2 (L) 09/29/2023   HCT 33.0 (L) 09/29/2023   MCV 95.1 09/29/2023   PLT 260 09/29/2023      Chemistry      Component Value Date/Time   NA 142 09/29/2023 2120   NA 138 07/10/2018 1103   K 4.7 09/29/2023 2120   CL 109 09/29/2023 2114   CO2 21 (L) 09/29/2023 2114   BUN 22 09/29/2023 2114   BUN 17 07/10/2018 1103   CREATININE 1.93 (H) 09/29/2023 2114   CREATININE 2.22 (H) 06/27/2023 1426   CREATININE 2.03 (H) 07/06/2022 1145      Component Value Date/Time   CALCIUM  8.9 09/29/2023 2114   ALKPHOS 82 09/29/2023 2114   AST 17 09/29/2023 2114   AST 11 (L) 06/27/2023 1426   ALT 7 09/29/2023 2114   ALT 7 06/27/2023 1426   BILITOT 0.5 09/29/2023 2114   BILITOT 0.6 06/27/2023 1426       RADIOGRAPHIC STUDIES: CT Chest Wo Contrast Result Date: 11/03/2023 EXAM: CT CHEST WITHOUT CONTRAST 10/29/2023 05:49:00 PM TECHNIQUE: CT of the chest was performed without the administration of intravenous contrast. Multiplanar reformatted images are provided for review. Automated exposure control,  iterative reconstruction, and/or weight based adjustment of the mA/kV was utilized to reduce the radiation dose to as low as reasonably achievable. COMPARISON: 09/30/2023 CLINICAL HISTORY: Non-small cell lung cancer (NSCLC), staging. Reason for exam: Non-small cell lung cancer (NSCLC), staging, Malignant neoplasm of unspecified part of unspecified bronchus or lung (HCC) FINDINGS: MEDIASTINUM: Heart and pericardium are unremarkable. The central airways are clear. LYMPH NODES: No mediastinal, hilar or axillary lymphadenopathy. LUNGS AND PLEURA: 8 mm posterior right lower lobe nodule (image 94), unchanged. Adjacent 10 mm branching nodular opacity (image 95), unchanged. 3 mm central right upper lobe nodule (image 49), unchanged.  2 mm posterior right upper lobe nodule (image 76) likely benign. Mild intraobular and paraseptal emphysematous changes, upper lung predominant. No focal consolidation or pulmonary edema. No pleural effusion or pneumothorax. SOFT TISSUES/BONES: No acute abnormality of the bones or soft tissues. UPPER ABDOMEN: Scarring in the posterior right upper kidney. VASCULATURE: Mild thoracic aortic atherosclerosis. IMPRESSION: 1. Two stable clustered/branching nodules in the RLL measuring up to 10 mm. While lack of hypermetabolism on prior PET is reassuring, continued attention on follow-up is suggested in 3-6 months. Electronically signed by: Pinkie Pebbles MD 11/03/2023 09:31 PM EDT RP Workstation: [id number]    ASSESSMENT AND PLAN: This is a very pleasant 68 years old African-American male with stage Ia (T1b, N0, M0) non-small cell lung cancer presented with right lower lobe lung nodule in addition to suspicious small groundglass opacity in the right upper lobe and few other tiny pulmonary nodules diagnosed in April 2025.  He is status post curative SBRT under the care of Dr. Dewey completed on July 24, 2023.  Assessment and Plan Assessment & Plan Stage IA non-small cell lung cancer, status post  curative SPRT, right upper lobe Stage IA non-small cell lung cancer in the right upper lobe, status post curative stereotactic body radiation therapy (SPRT) completed on July 24, 2023. The nodule initially shrunk post-treatment and is currently stable with no growth or spread observed on the latest CT scan. The current appearance may represent post-radiation scarring rather than active disease. - Continue surveillance with CT scan in 3 months to monitor the nodule. - If stable, extend surveillance to every 6 months for the first 2 years.  Pulmonary nodules under surveillance Multiple pulmonary nodules under surveillance. The larger nodule was treated with SPRT, while smaller nodules remain untreated. - Continue surveillance with CT scan in 3 months to monitor the smaller nodules. The patient voices understanding of current disease status and treatment options and is in agreement with the current care plan.  All questions were answered. The patient knows to call the clinic with any problems, questions or concerns. We can certainly see the patient much sooner if necessary. The total time spent in the appointment was 20 minutes including review of chart and various tests results, discussions about plan of care and coordination of care plan .   Disclaimer: This note was dictated with voice recognition software. Similar sounding words can inadvertently be transcribed and may not be corrected upon review.

## 2023-11-13 DIAGNOSIS — M5106 Intervertebral disc disorders with myelopathy, lumbar region: Secondary | ICD-10-CM | POA: Diagnosis not present

## 2023-11-13 DIAGNOSIS — M79604 Pain in right leg: Secondary | ICD-10-CM | POA: Diagnosis not present

## 2023-11-14 DIAGNOSIS — M79604 Pain in right leg: Secondary | ICD-10-CM | POA: Diagnosis not present

## 2023-11-14 DIAGNOSIS — M5106 Intervertebral disc disorders with myelopathy, lumbar region: Secondary | ICD-10-CM | POA: Diagnosis not present

## 2023-11-20 ENCOUNTER — Encounter: Payer: Self-pay | Admitting: Vascular Surgery

## 2023-11-20 ENCOUNTER — Ambulatory Visit: Attending: Vascular Surgery | Admitting: Vascular Surgery

## 2023-11-20 VITALS — BP 162/98 | HR 67 | Temp 98.6°F | Ht 72.0 in | Wt 155.3 lb

## 2023-11-20 DIAGNOSIS — I70213 Atherosclerosis of native arteries of extremities with intermittent claudication, bilateral legs: Secondary | ICD-10-CM

## 2023-11-20 NOTE — Progress Notes (Signed)
 [facility] [place]  ASSESSMENT / PLAN: Kevin [name] is a 68 y.o. 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  Recommend:  Abstinence from all tobacco products. Blood glucose control with goal A1c < 7%. Blood pressure control with goal blood pressure < 130/80 mmHg. Lipid reduction therapy with goal LDL-C < 55 mg/dL. Aspirin  81mg  by mouth daily. Atorvastatin  40-80mg  PO QD (or other high intensity statin therapy). Daily walking to and past the point of discomfort.   Recommend trial of medical therapy for intermittent claudication.  Follow-up with me in 3 months with ABI and arterial duplex of bilateral lower extremities for consideration of angiogram  CHIEF COMPLAINT: Peripheral arterial disease  HISTORY OF PRESENT ILLNESS: Kevin [name] is a 68 y.o. male referred to clinic for evaluation of bilateral lower extremity peripheral arterial disease.  Patient has a longstanding history of peripheral neuropathy which is quite bothersome to him.  He takes Lyrica  for this.  He has pronounced calluses on his bilateral feet which are neuropathic in appearance.  He describes typical claudication symptoms after about 2 blocks.  He has cramping discomfort in his thighs and then his calves.  As he rests this will resolve.  He does not have rest pain symptoms.  He has no ulcers about his feet.  Past Medical History:  Diagnosis Date   AIDS (acquired immune deficiency syndrome) (HCC) 08/17/2016   Amaurosis fugax 08/18/2022   Chronic diastolic CHF (congestive heart failure), NYHA class 3 (HCC) 01/2016   Chronic lower back pain    CKD (chronic kidney disease), stage III (HCC)    stage 3b   COPD (chronic obstructive pulmonary disease) (HCC)    Dyspnea    Gout    forearms, hands, ankles, feet (06/05/2016)   Headache    weekly (06/05/2016)   Heart murmur    never has caused any problems per pt   History of  herpes zoster virus 01/2023   Hypertension    Hypertensive crisis 08/15/2017   Lipoma 07/06/2022   Neuromuscular disorder (HCC) 02/05/2023   post herpetic neuralgia    -  Primary   OSA on CPAP    does not use CPAP   PAD (peripheral artery disease)    PAF (paroxysmal atrial fibrillation) (HCC) 01/2016   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   Pulmonary nodules 02/2023   Substance abuse (HCC)    in past    Past Surgical History:  Procedure Laterality Date   BRONCHIAL BIOPSY  06/11/2023   Procedure: BRONCHOSCOPY, WITH BIOPSY;  Surgeon: Shelah Lamar RAMAN, MD;  Location[facility] ENDOSCOPY;  Service: Pulmonary;;   BRONCHIAL BRUSHINGS  06/11/2023   Procedure: BRONCHOSCOPY, WITH BRUSH BIOPSY;  Surgeon: Shelah Lamar RAMAN, MD;  Location: [facility] ENDOSCOPY;  Service: Pulmonary;;   BRONCHIAL NEEDLE ASPIRATION BIOPSY  06/11/2023   Procedure: BRONCHOSCOPY, WITH NEEDLE ASPIRATION BIOPSY;  Surgeon: Shelah Lamar RAMAN, MD;  Location: [facility] ENDOSCOPY;  Service: Pulmonary;;   BRONCHIAL WASHINGS  06/11/2023   Procedure: IRRIGATION, BRONCHUS;  Surgeon: Shelah Lamar RAMAN, MD;  Location: [facility] ENDOSCOPY;  Service: Pulmonary;;   COLONOSCOPY N/A 04/24/2023   Procedure: COLONOSCOPY;  Surgeon: Rosalie Kitchens, MD;  Location: [facility] ENDOSCOPY;  Service: Gastroenterology;  Laterality: N/A;   IR RADIOLOGIST EVAL & MGMT  05/31/2021   IR RADIOLOGIST EVAL & MGMT  07/26/2021   LEFT HEART CATH AND CORONARY ANGIOGRAPHY N/A 10/23/2016   Procedure: LEFT HEART CATH AND CORONARY ANGIOGRAPHY;  Surgeon: Anner Alm ORN,  MD;  Location: [facility] INVASIVE CV LAB;  Service: Cardiovascular;  Laterality: N/A;   LOWER EXTREMITY ANGIOGRAPHY N/A 07/17/2016   Procedure: Lower Extremity Angiography;  Surgeon: Court Dorn PARAS, MD;  Location: [facility] INVASIVE CV LAB;  Service: Cardiovascular;  Laterality: N/A;   LOWER EXTREMITY INTERVENTION N/A 06/05/2016   Procedure: Lower Extremity Intervention;  Surgeon: Dorn PARAS Court, MD;  Location: [facility] INVASIVE CV LAB;  Service:  Cardiovascular;  Laterality: N/A;   PERIPHERAL VASCULAR ATHERECTOMY Right 07/17/2016   Procedure: Peripheral Vascular Atherectomy;  Surgeon: Court Dorn PARAS, MD;  Location: [facility] INVASIVE CV LAB;  Service: Cardiovascular;  Laterality: Right;  SFA   PERIPHERAL VASCULAR INTERVENTION  06/05/2016   Procedure: Peripheral Vascular Intervention;  Surgeon: Dorn PARAS Court, MD;  Location: [facility] INVASIVE CV LAB;  Service: Cardiovascular;;  left SFA   POLYPECTOMY  04/24/2023   Procedure: POLYPECTOMY;  Surgeon: Rosalie Kitchens, MD;  Location: [facility] ENDOSCOPY;  Service: Gastroenterology;;   RADIOLOGY WITH ANESTHESIA Right 06/29/2021   Procedure: RIGHT RENAL CRYOABLATION;  Surgeon: Philip Cornet, MD;  Location: [facility] ORS;  Service: Anesthesiology;  Laterality: Right;   VIDEO BRONCHOSCOPY WITH ENDOBRONCHIAL NAVIGATION Right 06/11/2023   Procedure: VIDEO BRONCHOSCOPY WITH ENDOBRONCHIAL NAVIGATION;  Surgeon: Shelah Lamar RAMAN, MD;  Location: [facility] ENDOSCOPY;  Service: Pulmonary;  Laterality: Right;  WITH FLUORO AND BIOPSY    Family History  Problem Relation Age of Onset   High blood pressure Mother    Lupus Mother    Seizures Daughter    Heart attack Daughter     Social History   Socioeconomic History   Marital status: Significant Other    Spouse name: Not on file   Number of children: Not on file   Years of education: Not on file   Highest education level: Not on file  Occupational History   Not on file  Tobacco Use   Smoking status: Some Days    Current packs/day: 0.10    Average packs/day: 0.1 packs/day for 42.0 years (4.2 ttl pk-yrs)    Types: Cigarettes    Passive exposure: Never   Smokeless tobacco: Never   Tobacco comments:    Has not smoked since May 4th.2025-06/20/2023        Pt smoke 4-5 cigarettes per day  Vaping Use   Vaping status: Former   Substances: Nicotine , Flavoring  Substance and Sexual Activity   Alcohol use: Not Currently    Alcohol/week: 2.0 standard drinks of alcohol    Types: 2 Cans of  beer per week    Comment: rare   Drug use: Not Currently    Comment: rare   Sexual activity: Not Currently    Comment: declined condoms  Other Topics Concern   Not on file  Social History Narrative   Right handed   Social Drivers of Health   Financial Resource Strain: High Risk (10/05/2022)   Overall Financial Resource Strain (CARDIA)    Difficulty of Paying Living Expenses: Hard  Food Insecurity: No Food Insecurity (09/30/2023)   Hunger Vital Sign    Worried About Running Out of Food in the Last Year: Never true    Ran Out of Food in the Last Year: Never true  Transportation Needs: No Transportation Needs (09/30/2023)   PRAPARE - Administrator, Civil Service (Medical): No    Lack of Transportation (Non-Medical): No  Physical Activity: Inactive (10/05/2022)   Exercise Vital Sign    Days of Exercise per Week: 0 days    Minutes of Exercise per  Session: 0 min  Stress: Stress Concern Present (05/04/2022)   [employer] of Occupational Health - Occupational Stress Questionnaire    Feeling of Stress : To some extent  Social Connections: Moderately Isolated (09/30/2023)   Social Connection and Isolation Panel    Frequency of Communication with Friends and Family: More than three times a week    Frequency of Social Gatherings with Friends and Family: Once a week    Attends Religious Services: Never    Database administrator or Organizations: No    Attends Banker Meetings: Never    Marital Status: Married  Catering manager Violence: Not At Risk (09/30/2023)   Humiliation, Afraid, Rape, and Kick questionnaire    Fear of Current or Ex-Partner: No    Emotionally Abused: No    Physically Abused: No    Sexually Abused: No    Allergies  Allergen Reactions   Isosorbide  Mononitrate [Isosorbide  Nitrate] Shortness Of Breath    Current Outpatient Medications  Medication Sig Dispense Refill   albuterol  (VENTOLIN  HFA) 108 (90 Base) MCG/ACT inhaler Inhale 1-2  puffs into the lungs every 6 (six) hours as needed for wheezing or shortness of breath. 6.7 g 3   amitriptyline  (ELAVIL ) 10 MG tablet Take 1 tablet (10 mg total) by mouth at bedtime. 30 tablet 0   amLODipine -valsartan  (EXFORGE ) 10-320 MG tablet Take 1 tablet by mouth daily. 90 tablet 2   aspirin  EC (ASPIRIN  LOW DOSE) 81 MG tablet Take 1 tablet (81 mg total) by mouth daily, swallow whole 30 tablet 12   atorvastatin  (LIPITOR) 80 MG tablet Take 1 tablet (80 mg total) by mouth daily. 90 tablet 3   baclofen (LIORESAL) 10 MG tablet Take 10 mg by mouth 3 (three) times daily.     celecoxib (CELEBREX) 200 MG capsule Take 200 mg by mouth 2 (two) times daily as needed (for pain).     ciclopirox  (PENLAC ) 8 % solution Apply topically at bedtime. Apply over nail and surrounding skin. Apply daily over previous coat. After seven (7) days, may remove with alcohol and continue cycle. 6.6 mL 2   dolutegravir -lamiVUDine  (DOVATO ) 50-300 MG tablet Take 1 tablet by mouth daily. 30 tablet 5   Fluticasone -Umeclidin-Vilant (TRELEGY ELLIPTA ) 200-62.5-25 MCG/ACT AEPB Inhale 1 puff into the lungs daily. 60 each 2   hydrALAZINE  (APRESOLINE ) 25 MG tablet Take 1 tablet (25 mg total) by mouth 2 (two) times daily. 180 tablet 3   hydrOXYzine  (ATARAX ) 25 MG tablet Take 25 mg by mouth 4 (four) times daily.     ipratropium-albuterol  (DUONEB) 0.5-2.5 (3) MG/3ML SOLN Take 3 mLs by nebulization every 6 (six) hours as needed. (Patient taking differently: Take 3 mLs by nebulization every 6 (six) hours as needed (for shortness of breath or wheezing).) 360 mL 12   montelukast  (SINGULAIR ) 10 MG tablet Take 1 tablet (10 mg total) by mouth at bedtime. 30 tablet 5   nebivolol  (BYSTOLIC ) 5 MG tablet Take 1 tablet (5 mg total) by mouth daily. 90 tablet 3   oxyCODONE -acetaminophen  (PERCOCET) 10-325 MG tablet Take 1 tablet by mouth 3 (three) times daily.     pregabalin  (LYRICA ) 50 MG capsule Take 1 capsule (50 mg total) by mouth 2 (two) times daily. 60  capsule 0   temazepam  (RESTORIL ) 30 MG capsule Take 1 capsule (30 mg total) by mouth at bedtime as needed for sleep. 30 capsule 5   Vitamin D , Ergocalciferol , (DRISDOL ) 1.25 MG (50000 UNIT) CAPS capsule Take 1 capsule (50,000 Units total)  by mouth once a week for 16 weeks. Then recheck level in the office. 12 capsule 0   No current facility-administered medications for this visit.    PHYSICAL EXAM Vitals:   11/20/23 1340  BP: (!) 162/98  Pulse: 67  Temp: 98.6 F (37 C)  SpO2: 94%  Weight: 155 lb 4.8 oz (70.4 kg)  Height: 6' (1.829 m)   No acute distress Regular rate and rhythm Unlabored breathing No palpable pedal pulses   PERTINENT LABORATORY AND RADIOLOGIC DATA  Most recent CBC    Latest Ref Rng & Units 09/29/2023    9:20 PM 09/29/2023    9:14 PM 08/03/2023    3:44 AM  CBC  WBC 4.0 - 10.5 K/uL  8.5  6.7   Hemoglobin 13.0 - 17.0 g/dL 88.7  88.6  88.8   Hematocrit 39.0 - 52.0 % 33.0  32.7  32.8   Platelets 150 - 400 K/uL  260  238      Most recent CMP    Latest Ref Rng & Units 09/29/2023    9:20 PM 09/29/2023    9:14 PM 08/03/2023    3:44 AM  CMP  Glucose 70 - 99 mg/dL  83  87   BUN 8 - 23 mg/dL  22  25   Creatinine 9.38 - 1.24 mg/dL  8.06  7.68   Sodium 864 - 145 mmol/L 142  141  141   Potassium 3.5 - 5.1 mmol/L 4.7  3.9  4.0   Chloride 98 - 111 mmol/L  109  113   CO2 22 - 32 mmol/L  21  20   Calcium  8.9 - 10.3 mg/dL  8.9  8.7   Total Protein 6.5 - 8.1 g/dL  7.4    Total Bilirubin 0.0 - 1.2 mg/dL  0.5    Alkaline Phos 38 - 126 U/L  82    AST 15 - 41 U/L  17    ALT 0 - 44 U/L  7      Renal function CrCl cannot be calculated (Patient's most recent lab result is older than the maximum 21 days allowed.).  Hemoglobin A1C (%)  Date Value  10/19/2023 5.5   Hgb A1c MFr Bld (% of total Hgb)  Date Value  04/28/2022 5.1    LDL Cholesterol (Calc)  Date Value Ref Range Status  07/06/2022 110 (H) mg/dL (calc) Final    Comment:    Reference range:  <100 . Desirable range <100 mg/dL for primary prevention;   <70 mg/dL for patients with CHD or diabetic patients  with > or = 2 CHD risk factors. SABRA LDL-C is now calculated using the Martin-Hopkins  calculation, which is a validated novel method providing  better accuracy than the Friedewald equation in the  estimation of LDL-C.  Gladis APPLETHWAITE et al. SANDREA. 7986;689(80): 2061-2068  (http://education.[facility].com/faq/FAQ164)    LDL Chol Calc (NIH)  Date Value Ref Range Status  10/24/2022 97 0 - 99 mg/dL Final     LOWER EXTREMITY DOPPLER STUDY   Patient Name:  [name]  Date of Exam:   10/08/2023  Medical Rec #: [id number]        Accession #:    [id number]  Date of Birth: [birth year]        Patient Gender: M  Patient Age:   3 years  Exam Location:  [address]  Procedure:      VAS US  ABI WITH/WO TBI  Referring Phys: DONNICE FEES    ---------------------------------------------------------------------------  -----  Indications: Claudication, and peripheral artery disease.   High Risk Factors: Hypertension, hyperlipidemia, current smoker, coronary  artery                    disease.     Comparison Study: 09/05/22   Performing Technologist: Garnette Rockers     Examination Guidelines: A complete evaluation includes at minimum, Doppler  waveform signals and systolic blood pressure reading at the level of  bilateral  brachial, anterior tibial, and posterior tibial arteries, when vessel  segments  are accessible. Bilateral testing is considered an integral part of a  complete  examination. Photoelectric Plethysmograph (PPG) waveforms and toe systolic  pressure readings are included as required and additional duplex testing  as  needed. Limited examinations for reoccurring indications may be performed  as  noted.     ABI Findings:  +---------+------------------+-----+-------------------+--------+  Right   Rt Pressure (mmHg)IndexWaveform            Comment   +---------+------------------+-----+-------------------+--------+  Brachial 178                                                 +---------+------------------+-----+-------------------+--------+  PTA     133               0.74 monophasic                   +---------+------------------+-----+-------------------+--------+  DP      117               0.65 dampened monophasic          +---------+------------------+-----+-------------------+--------+  Great Toe65                0.36 Abnormal                     +---------+------------------+-----+-------------------+--------+   +---------+------------------+-----+----------+-------+  Left    Lt Pressure (mmHg)IndexWaveform  Comment  +---------+------------------+-----+----------+-------+  Brachial 180                                       +---------+------------------+-----+----------+-------+  PTA     126               0.70 monophasic         +---------+------------------+-----+----------+-------+  DP      124               0.69 monophasic         +---------+------------------+-----+----------+-------+  Great Toe69                0.38 Abnormal           +---------+------------------+-----+----------+-------+   +-------+-----------+-----------+------------+------------+  ABI/TBIToday's ABIToday's TBIPrevious ABIPrevious TBI  +-------+-----------+-----------+------------+------------+  Right 0.74       0.36       0.86        0.48          +-------+-----------+-----------+------------+------------+  Left  0.7        0.38       0.86        0.52          +-------+-----------+-----------+------------+------------+     TOES Findings:  +----------+---------------+--------+-------+  Right ToesPressure (mmHg)WaveformComment  +----------+---------------+--------+-------+  1st Digit  Abnormal          +----------+---------------+--------+-------+  2nd Digit                Abnormal         +----------+---------------+--------+-------+  3rd Digit                Abnormal         +----------+---------------+--------+-------+  4th Digit                Abnormal         +----------+---------------+--------+-------+  5th Digit                Abnormal         +----------+---------------+--------+-------+      +---------+---------------+--------+-------+  Left ToesPressure (mmHg)WaveformComment  +---------+---------------+--------+-------+  1st Digit               Abnormal         +---------+---------------+--------+-------+  2nd Digit               Abnormal         +---------+---------------+--------+-------+  3rd Digit               Abnormal         +---------+---------------+--------+-------+  4th Digit               Abnormal         +---------+---------------+--------+-------+  5th Digit               Abnormal         +---------+---------------+--------+-------+         Bilateral ABIs and TBIs appear decreased compared to prior study on  09/05/22.    Summary:  Right: Resting right ankle-brachial index indicates moderate right lower  extremity arterial disease.   Left: Resting left ankle-brachial index indicates moderate left lower  extremity arterial disease.   Debby SAILOR. Magda, MD [facility] Phone Number: [phone] 11/20/2023 4:19 PM   Total time spent on preparing this encounter including chart review, data review, collecting history, examining the patient, and coordinating care: 45 min  Portions of this report may have been transcribed using voice recognition software.  Every effort has been made to ensure accuracy; however, inadvertent computerized transcription errors may still be present.

## 2023-11-21 ENCOUNTER — Ambulatory Visit (HOSPITAL_COMMUNITY)
Admission: EM | Admit: 2023-11-21 | Discharge: 2023-11-21 | Disposition: A | Discharge status: Home / Self Care | Attending: Physician Assistant | Admitting: Physician Assistant

## 2023-11-21 ENCOUNTER — Encounter (HOSPITAL_COMMUNITY): Payer: Self-pay

## 2023-11-21 DIAGNOSIS — J441 Chronic obstructive pulmonary disease with (acute) exacerbation: Secondary | ICD-10-CM

## 2023-11-21 MED ORDER — ALBUTEROL SULFATE (2.5 MG/3ML) 0.083% IN NEBU
5.0000 mg | INHALATION_SOLUTION | Freq: Once | RESPIRATORY_TRACT | Status: AC
  Administered 2023-11-21: 5 mg via RESPIRATORY_TRACT

## 2023-11-21 MED ORDER — METHYLPREDNISOLONE SODIUM SUCC 125 MG IJ SOLR
INTRAMUSCULAR | Status: AC
  Filled 2023-11-21: qty 2

## 2023-11-21 MED ORDER — ALBUTEROL SULFATE (2.5 MG/3ML) 0.083% IN NEBU
INHALATION_SOLUTION | RESPIRATORY_TRACT | Status: AC
Start: 2023-11-21 — End: 2023-11-21
  Filled 2023-11-21: qty 3

## 2023-11-21 MED ORDER — IPRATROPIUM-ALBUTEROL 0.5-2.5 (3) MG/3ML IN SOLN
3.0000 mL | Freq: Once | RESPIRATORY_TRACT | Status: AC
  Administered 2023-11-21: 3 mL via RESPIRATORY_TRACT

## 2023-11-21 MED ORDER — METHYLPREDNISOLONE SODIUM SUCC 125 MG IJ SOLR
125.0000 mg | Freq: Once | INTRAMUSCULAR | Status: AC
Start: 2023-11-21 — End: 2023-11-21
  Administered 2023-11-21: 125 mg via INTRAMUSCULAR

## 2023-11-21 MED ORDER — IPRATROPIUM-ALBUTEROL 0.5-2.5 (3) MG/3ML IN SOLN
RESPIRATORY_TRACT | Status: AC
  Filled 2023-11-21: qty 3

## 2023-11-21 MED ORDER — PREDNISONE 50 MG PO TABS
50.0000 mg | ORAL_TABLET | Freq: Every day | ORAL | 0 refills | Status: DC
  Filled 2023-11-21 – 2023-11-22 (×2): qty 5, 5d supply, fill #0

## 2023-11-21 NOTE — ED Triage Notes (Signed)
 Patient presents with SOB and asthma flare-up that started several days ago. Patient states he last use his inhaler this morning. Patient is requesting a steroid injection.

## 2023-11-21 NOTE — ED Provider Notes (Signed)
 [facility]    CSN: [id number] Arrival date & time: 11/21/23  1640      History   Chief Complaint Chief Complaint  Patient presents with   Asthma   Shortness of Breath    HPI Kevin [name] is a 68 y.o. male.   Patient complains of shortness of breath.  Patient reports that he has COPD.  He came in today because he is not having good relief with his home nebulizer and albuterol  inhaler.  Patient reports he has had exacerbations of COPD in the past that required him to be on steroids.  Patient reports he has not had a fever he has not had any chills.  Patient states that he usually has exacerbations with change in the weather and he has noticed increasing shortness of breath recently.  Patient denies any fever or chills his cough is nonproductive.  The history is provided by the patient. No language interpreter was used.  Asthma Associated symptoms include shortness of breath.  Shortness of Breath Associated symptoms: wheezing     Past Medical History:  Diagnosis Date   AIDS (acquired immune deficiency syndrome) (HCC) 08/17/2016   Amaurosis fugax 08/18/2022   Chronic diastolic CHF (congestive heart failure), NYHA class 3 (HCC) 01/2016   Chronic lower back pain    CKD (chronic kidney disease), stage III (HCC)    stage 3b   COPD (chronic obstructive pulmonary disease) (HCC)    Dyspnea    Gout    forearms, hands, ankles, feet (06/05/2016)   Headache    weekly (06/05/2016)   Heart murmur    never has caused any problems per pt   History of herpes zoster virus 01/2023   Hypertension    Hypertensive crisis 08/15/2017   Lipoma 07/06/2022   Neuromuscular disorder (HCC) 02/05/2023   post herpetic neuralgia    -  Primary   OSA on CPAP    does not use CPAP   PAD (peripheral artery disease)    PAF (paroxysmal atrial fibrillation) (HCC) 01/2016   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   Pulmonary nodules 02/2023   Substance abuse (HCC)    in past     Patient Active Problem List   Diagnosis Date Noted   Health care maintenance 08/31/2023   Screening for STDs (sexually transmitted diseases) 08/31/2023   HIV disease (HCC) 08/31/2023   Immunization counseling 08/31/2023   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) 08/21/2023   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   Disability affecting daily living 08/21/2023   Stroke-like episode 08/21/2023   Neoplastic malignant related fatigue 08/21/2023   Hypertensive emergency 08/01/2023   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   Left renal mass 04/03/2023   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   COPD with acute exacerbation (HCC) 03/19/2023   COPD exacerbation (HCC) 12/21/2022   Anxiety 12/19/2022   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   Skin nodule 08/08/2022   Lung nodule, multiple 07/26/2022   Lipoma 07/06/2022   Chronic low back pain 04/29/2022   Chronic heart failure with preserved ejection fraction (HFpEF) (HCC) 12/27/2021   HLD (hyperlipidemia) 12/27/2021   Elevated troponin 12/27/2021   Right renal mass 06/29/2021   Renal lesion 06/29/2021   Acute respiratory failure with hypoxia (HCC)    Callus of foot 07/10/2018   Hypertensive urgency 08/15/2017   CAD (coronary artery disease) 10/19/2016   Easy bruising 08/11/2016   Claudication in peripheral vascular disease 06/05/2016  CKD stage 3b, GFR 30-44 ml/min (HCC) - baseline SCr 1.8-2.2    Peripheral arterial disease 05/09/2016   OSA (obstructive sleep apnea) 03/24/2016   Essential hypertension 02/18/2016   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   Shortness of breath 02/07/2016   Paroxysmal A-fib (HCC) 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   Blurry vision, bilateral    HIV (human immunodeficiency virus infection) (HCC) 08/27/2004    Past Surgical History:  Procedure Laterality Date   BRONCHIAL BIOPSY   06/11/2023   Procedure: BRONCHOSCOPY, WITH BIOPSY;  Surgeon: Shelah Lamar RAMAN, MD;  Location: [facility] ENDOSCOPY;  Service: Pulmonary;;   BRONCHIAL BRUSHINGS  06/11/2023   Procedure: BRONCHOSCOPY, WITH BRUSH BIOPSY;  Surgeon: Shelah Lamar RAMAN, MD;  Location: [facility] ENDOSCOPY;  Service: Pulmonary;;   BRONCHIAL NEEDLE ASPIRATION BIOPSY  06/11/2023   Procedure: BRONCHOSCOPY, WITH NEEDLE ASPIRATION BIOPSY;  Surgeon: Shelah Lamar RAMAN, MD;  Location: [facility] ENDOSCOPY;  Service: Pulmonary;;   BRONCHIAL WASHINGS  06/11/2023   Procedure: IRRIGATION, BRONCHUS;  Surgeon: Shelah Lamar RAMAN, MD;  Location: [facility] ENDOSCOPY;  Service: Pulmonary;;   COLONOSCOPY N/A 04/24/2023   Procedure: COLONOSCOPY;  Surgeon: Rosalie Kitchens, MD;  Location: [facility] ENDOSCOPY;  Service: Gastroenterology;  Laterality: N/A;   IR RADIOLOGIST EVAL & MGMT  05/31/2021   IR RADIOLOGIST EVAL & MGMT  07/26/2021   LEFT HEART CATH AND CORONARY ANGIOGRAPHY N/A 10/23/2016   Procedure: LEFT HEART CATH AND CORONARY ANGIOGRAPHY;  Surgeon: Anner Alm ORN, MD;  Location: [facility] INVASIVE CV LAB;  Service: Cardiovascular;  Laterality: N/A;   LOWER EXTREMITY ANGIOGRAPHY N/A 07/17/2016   Procedure: Lower Extremity Angiography;  Surgeon: Court Dorn PARAS, MD;  Location: [facility] INVASIVE CV LAB;  Service: Cardiovascular;  Laterality: N/A;   LOWER EXTREMITY INTERVENTION N/A 06/05/2016   Procedure: Lower Extremity Intervention;  Surgeon: Dorn PARAS Court, MD;  Location: [facility] INVASIVE CV LAB;  Service: Cardiovascular;  Laterality: N/A;   PERIPHERAL VASCULAR ATHERECTOMY Right 07/17/2016   Procedure: Peripheral Vascular Atherectomy;  Surgeon: Court Dorn PARAS, MD;  Location: [facility] INVASIVE CV LAB;  Service: Cardiovascular;  Laterality: Right;  SFA   PERIPHERAL VASCULAR INTERVENTION  06/05/2016   Procedure: Peripheral Vascular Intervention;  Surgeon: Dorn PARAS Court, MD;  Location: [facility] INVASIVE CV LAB;  Service: Cardiovascular;;  left SFA   POLYPECTOMY  04/24/2023   Procedure: POLYPECTOMY;  Surgeon: Rosalie Kitchens, MD;  Location: [facility] ENDOSCOPY;  Service: Gastroenterology;;   RADIOLOGY WITH ANESTHESIA Right 06/29/2021   Procedure: RIGHT RENAL CRYOABLATION;  Surgeon: Philip Cornet, MD;  Location: [facility] ORS;  Service: Anesthesiology;  Laterality: Right;   VIDEO BRONCHOSCOPY WITH ENDOBRONCHIAL NAVIGATION Right 06/11/2023   Procedure: VIDEO BRONCHOSCOPY WITH ENDOBRONCHIAL NAVIGATION;  Surgeon: Shelah Lamar RAMAN, MD;  Location: [facility] ENDOSCOPY;  Service: Pulmonary;  Laterality: Right;  WITH FLUORO AND BIOPSY       Home Medications    Prior to Admission medications   Medication Sig Start Date End Date Taking? Authorizing Provider  albuterol  (VENTOLIN  HFA) 108 (90 Base) MCG/ACT inhaler Inhale 1-2 puffs into the lungs every 6 (six) hours as needed for wheezing or shortness of breath. 09/30/23  Yes Jadine Toribio SQUIBB, MD  amitriptyline  (ELAVIL ) 10 MG tablet Take 1 tablet (10 mg total) by mouth at bedtime. 10/19/23 12/01/23 Yes [name], MD  amLODipine -valsartan  (EXFORGE ) 10-320 MG tablet Take 1 tablet by mouth daily. 04/19/23  Yes Goodrich, Callie E, PA-C  aspirin  EC (ASPIRIN  LOW DOSE) 81 MG tablet Take 1 tablet (  81 mg total) by mouth daily, swallow whole 05/07/23  Yes [name], MD  atorvastatin  (LIPITOR) 80 MG tablet Take 1 tablet (80 mg total) by mouth daily. 07/18/23  Yes Walker, Caitlin S, NP  baclofen (LIORESAL) 10 MG tablet Take 10 mg by mouth 3 (three) times daily.   Yes [provider]  celecoxib (CELEBREX) 200 MG capsule Take 200 mg by mouth 2 (two) times daily as needed (for pain).   Yes [provider]  ciclopirox  (PENLAC ) 8 % solution Apply topically at bedtime. Apply over nail and surrounding skin. Apply daily over previous coat. After seven (7) days, may remove with alcohol and continue cycle. 07/03/23  Yes Gershon Donnice SAUNDERS, DPM  dolutegravir -lamiVUDine  (DOVATO ) 50-300 MG tablet Take 1 tablet by mouth daily. 08/31/23  Yes Manandhar, Sabina, MD  Fluticasone -Umeclidin-Vilant  (TRELEGY ELLIPTA ) 200-62.5-25 MCG/ACT AEPB Inhale 1 puff into the lungs daily. 09/30/23  Yes Jadine Toribio SQUIBB, MD  ipratropium-albuterol  (DUONEB) 0.5-2.5 (3) MG/3ML SOLN Take 3 mLs by nebulization every 6 (six) hours as needed. Patient taking differently: Take 3 mLs by nebulization every 6 (six) hours as needed (for shortness of breath or wheezing). 02/27/23  Yes Young, Reggy D, MD  montelukast  (SINGULAIR ) 10 MG tablet Take 1 tablet (10 mg total) by mouth at bedtime. 07/18/23  Yes Cobb, Comer GAILS, NP  nebivolol  (BYSTOLIC ) 5 MG tablet Take 1 tablet (5 mg total) by mouth daily. 07/18/23  Yes [name], NP  oxyCODONE -acetaminophen  (PERCOCET) 10-325 MG tablet Take 1 tablet by mouth 3 (three) times daily. 10/08/23  Yes [provider]  predniSONE  (DELTASONE ) 50 MG tablet One tablet a day, starting on 10/9 11/21/23  Yes Lynsie Mcwatters K, PA-C  pregabalin  (LYRICA ) 50 MG capsule Take 1 capsule (50 mg total) by mouth 2 (two) times daily. 10/19/23 04/16/24 Yes [name], MD  temazepam  (RESTORIL ) 30 MG capsule Take 1 capsule (30 mg total) by mouth at bedtime as needed for sleep. 08/07/23  Yes Young, Reggy D, MD  Vitamin D , Ergocalciferol , (DRISDOL ) 1.25 MG (50000 UNIT) CAPS capsule Take 1 capsule (50,000 Units total) by mouth once a week for 16 weeks. Then recheck level in the office. 10/24/23  Yes   hydrALAZINE  (APRESOLINE ) 25 MG tablet Take 1 tablet (25 mg total) by mouth 2 (two) times daily. 06/29/23 11/20/23  [name], NP  hydrOXYzine  (ATARAX ) 25 MG tablet Take 25 mg by mouth 4 (four) times daily. 10/12/23   [provider]    Family History Family History  Problem Relation Age of Onset   High blood pressure Mother    Lupus Mother    Seizures Daughter    Heart attack Daughter     Social History Social History   Tobacco Use   Smoking status: Some Days    Current packs/day: 0.10    Average packs/day: 0.1 packs/day for 42.0 years (4.2 ttl pk-yrs)    Types:  Cigarettes    Passive exposure: Never   Smokeless tobacco: Never   Tobacco comments:    Has not smoked since May 4th.2025-06/20/2023        Pt smoke 4-5 cigarettes per day  Vaping Use   Vaping status: Former   Substances: Nicotine , Flavoring  Substance Use Topics   Alcohol use: Not Currently    Alcohol/week: 2.0 standard drinks of alcohol    Types: 2 Cans of beer per week    Comment: rare   Drug use: Not Currently    Comment: rare  Allergies   Isosorbide  mononitrate [isosorbide  nitrate]   Review of Systems Review of Systems  Respiratory:  Positive for shortness of breath and wheezing.   All other systems reviewed and are negative.    Physical Exam Triage Vital Signs ED Triage Vitals  Encounter Vitals Group     BP 11/21/23 1702 (!) 175/108     Girls Systolic BP Percentile --      Girls Diastolic BP Percentile --      Boys Systolic BP Percentile --      Boys Diastolic BP Percentile --      Pulse Rate 11/21/23 1702 83     Resp 11/21/23 1712 (!) 32     Temp 11/21/23 1702 98.1 F (36.7 C)     Temp Source 11/21/23 1702 Oral     SpO2 11/21/23 1702 96 %     Weight --      Height --      Head Circumference --      Peak Flow --      Pain Score --      Pain Loc --      Pain Education --      Exclude from Growth Chart --    No data found.  Updated Vital Signs BP (!) 175/108 (BP Location: Left Arm)   Pulse 83   Temp 98.1 F (36.7 C) (Oral)   Resp (!) 32   SpO2 96%   Visual Acuity Right Eye Distance:   Left Eye Distance:   Bilateral Distance:    Right Eye Near:   Left Eye Near:    Bilateral Near:     Physical Exam Vitals and nursing note reviewed.  Constitutional:      Appearance: He is well-developed.  HENT:     Head: Normocephalic.  Cardiovascular:     Rate and Rhythm: Normal rate and regular rhythm.  Pulmonary:     Effort: Pulmonary effort is normal.     Breath sounds: Decreased breath sounds and wheezing present.  Abdominal:     General:  There is no distension.     Palpations: Abdomen is soft.  Musculoskeletal:        General: Normal range of motion.     Cervical back: Normal range of motion.  Skin:    General: Skin is warm.  Neurological:     General: No focal deficit present.     Mental Status: He is alert and oriented to person, place, and time.  Psychiatric:        Mood and Affect: Mood normal.      UC Treatments / Results  Labs (all labs ordered are listed, but only abnormal results are displayed) Labs Reviewed - No data to display  EKG   Radiology No results found.  Procedures Procedures (including critical care time)  Medications Ordered in UC Medications  ipratropium-albuterol  (DUONEB) 0.5-2.5 (3) MG/3ML nebulizer solution 3 mL (3 mLs Nebulization Given 11/21/23 1706)  methylPREDNISolone  sodium succinate (SOLU-MEDROL ) 125 mg/2 mL injection 125 mg (125 mg Intramuscular Given 11/21/23 1718)  albuterol  (PROVENTIL ) (2.5 MG/3ML) 0.083% nebulizer solution 5 mg (5 mg Nebulization Given 11/21/23 1805)    Initial Impression / Assessment and Plan / UC Course  I have reviewed the triage vital signs and the nursing notes.  Pertinent labs & imaging results that were available during my care of the patient were reviewed by me and considered in my medical decision making (see chart for details).     Patient reports that he  feels better after being given a DuoNeb and a second albuterol  neb.  Patient is requesting discharge.  Patient is given injection of Solu-Medrol  125 mg.  Patient states he has an albuterol  inhaler and plenty of neb solution at home.  Patient is advised if symptoms worsen or change he should go to the emergency department.  Patient reevaluated oxygen saturations are between 94 and 95%.  Lungs patient has continued rhonchi, breath sounds are much improved no wheezing.  Patient is discharged in stable condition Final Clinical Impressions(s) / UC Diagnoses   Final diagnoses:  COPD exacerbation  [facility])     Discharge Instructions      Use your nebulizer every 4 hours.    ED Prescriptions     Medication Sig Dispense Auth. Provider   predniSONE  (DELTASONE ) 50 MG tablet One tablet a day, starting on 10/9 5 tablet Gemma Ruan K, PA-C      PDMP not reviewed this encounter. An After Visit Summary was printed and given to the patient.       Flint Sonny POUR, PA-C 11/21/23 1904

## 2023-11-21 NOTE — Discharge Instructions (Addendum)
Use your nebulizer every 4 hours.

## 2023-11-22 ENCOUNTER — Other Ambulatory Visit (HOSPITAL_COMMUNITY): Payer: Self-pay

## 2023-11-22 ENCOUNTER — Other Ambulatory Visit: Payer: Self-pay | Admitting: *Deleted

## 2023-11-22 ENCOUNTER — Other Ambulatory Visit: Payer: Self-pay

## 2023-11-22 DIAGNOSIS — I70213 Atherosclerosis of native arteries of extremities with intermittent claudication, bilateral legs: Secondary | ICD-10-CM

## 2023-11-26 ENCOUNTER — Other Ambulatory Visit: Payer: Self-pay | Admitting: Family Medicine

## 2023-11-26 ENCOUNTER — Other Ambulatory Visit: Payer: Self-pay

## 2023-11-26 DIAGNOSIS — F5101 Primary insomnia: Secondary | ICD-10-CM

## 2023-11-26 DIAGNOSIS — B20 Human immunodeficiency virus [HIV] disease: Secondary | ICD-10-CM

## 2023-11-26 DIAGNOSIS — Z79899 Other long term (current) drug therapy: Secondary | ICD-10-CM | POA: Diagnosis not present

## 2023-11-26 DIAGNOSIS — F411 Generalized anxiety disorder: Secondary | ICD-10-CM

## 2023-11-26 MED ORDER — AMITRIPTYLINE HCL 10 MG PO TABS
10.0000 mg | ORAL_TABLET | Freq: Every day | ORAL | 0 refills | Status: DC
  Filled 2023-11-26: qty 30, 30d supply, fill #0

## 2023-11-28 ENCOUNTER — Other Ambulatory Visit: Payer: Self-pay

## 2023-11-29 ENCOUNTER — Ambulatory Visit: Admitting: Family Medicine

## 2023-11-29 ENCOUNTER — Encounter (HOSPITAL_BASED_OUTPATIENT_CLINIC_OR_DEPARTMENT_OTHER): Admitting: Family

## 2023-12-05 ENCOUNTER — Other Ambulatory Visit: Payer: Self-pay

## 2023-12-05 ENCOUNTER — Other Ambulatory Visit (HOSPITAL_COMMUNITY): Payer: Self-pay

## 2023-12-05 NOTE — Progress Notes (Signed)
 Specialty Pharmacy Refill Coordination Note  Kevin [name] is a 68 y.o. male contacted today regarding refills of specialty medication(s) Dolutegravir -lamiVUDine  (Dovato )   Patient requested Delivery   Delivery date: 12/07/23   Verified address: [address]  [place]  [address]   Medication will be filled on 12/06/23.

## 2023-12-06 ENCOUNTER — Other Ambulatory Visit: Payer: Self-pay

## 2023-12-06 ENCOUNTER — Ambulatory Visit: Admitting: Podiatry

## 2023-12-06 ENCOUNTER — Encounter: Payer: Self-pay | Admitting: Podiatry

## 2023-12-06 VITALS — Ht 72.0 in | Wt 155.3 lb

## 2023-12-06 DIAGNOSIS — L84 Corns and callosities: Secondary | ICD-10-CM

## 2023-12-06 DIAGNOSIS — M79674 Pain in right toe(s): Secondary | ICD-10-CM

## 2023-12-06 DIAGNOSIS — M79675 Pain in left toe(s): Secondary | ICD-10-CM

## 2023-12-06 DIAGNOSIS — I739 Peripheral vascular disease, unspecified: Secondary | ICD-10-CM | POA: Diagnosis not present

## 2023-12-06 DIAGNOSIS — B351 Tinea unguium: Secondary | ICD-10-CM

## 2023-12-06 NOTE — Progress Notes (Signed)
  Subjective:  Patient ID: Kevin [name], male    DOB: 05/20/1955,  MRN: [id number]  Chief Complaint  Patient presents with   Nail Problem    Pt is here for nail care and callous.     History of Present Illness Kevin [name] is a 68 year old male with circulation issues who presents with significant pain due to calluses and thickened toenails, which he is not able to trim himself.  He states the calluses do hurt as they get thicker.  Does not report any open lesions.  He wore the inserts for about a week but states it did not help.  He does not have them any longer.    Objective:    Physical Exam General: AAO x3, NAD  Dermatological: The toenails are all quite hypertrophic, dystrophic with yellow discoloration with tenderness palpation mostly to the toes on the left side.  There is no edema, erythema or signs of infection.  There are no open lesions noted bilaterally but there is thick preulcerative calluses submetatarsal 5 on the left foot worse on the right.  There is no underlying ulceration.  Overall unchanged.  Vascular: Dorsalis Pedis artery and Posterior Tibial artery pedal pulses are decreased bilateral with immedate capillary fill time.There is no pain with calf compression, swelling, warmth, erythema.   Neruologic: Grossly intact via light touch bilateral.   Musculoskeletal: Tenderness on the hyperkeratotic lesions as well as toenails.     Assessment:   Symptomatic onychomycosis, hyperkeratotic lesions with history of PAD  Plan:  Patient was evaluated and treated and all questions answered.  Assessment and Plan Assessment & Plan  Chronic foot pain -Given ongoing chronic foot pain we discussed both conservative as well as surgical treatment options.  Discussed with him given his history of PAD he is at high risk of complications for surgery and would like to continue with conservative care. -Continue Lyrica  - Continue follow-up with vascular  surgery  Symptomatic onychomycosis -Sharply debrided nails x 10 without any complications or bleeding.  Hyperkeratotic preulcerative calluses - Sharply debrided hyperkeratotic lesions x 2 with any complications or bleeding.  [name] Fees DPM

## 2023-12-07 ENCOUNTER — Other Ambulatory Visit: Payer: Self-pay

## 2023-12-07 ENCOUNTER — Encounter: Payer: Self-pay | Admitting: Infectious Diseases

## 2023-12-07 ENCOUNTER — Ambulatory Visit: Admitting: Infectious Diseases

## 2023-12-07 VITALS — BP 149/91 | HR 54 | Temp 97.4°F | Resp 16 | Wt 153.2 lb

## 2023-12-07 DIAGNOSIS — B20 Human immunodeficiency virus [HIV] disease: Secondary | ICD-10-CM | POA: Diagnosis not present

## 2023-12-07 DIAGNOSIS — Z5181 Encounter for therapeutic drug level monitoring: Secondary | ICD-10-CM | POA: Diagnosis not present

## 2023-12-07 DIAGNOSIS — Z113 Encounter for screening for infections with a predominantly sexual mode of transmission: Secondary | ICD-10-CM

## 2023-12-07 DIAGNOSIS — Z0000 Encounter for general adult medical examination without abnormal findings: Secondary | ICD-10-CM | POA: Diagnosis not present

## 2023-12-07 DIAGNOSIS — Z7185 Encounter for immunization safety counseling: Secondary | ICD-10-CM

## 2023-12-07 MED ORDER — DOVATO 50-300 MG PO TABS
ORAL_TABLET | ORAL | 5 refills | Status: AC
  Filled 2023-12-07: qty 30, fill #0
  Filled 2023-12-27 – 2024-01-09 (×3): qty 30, 30d supply, fill #0
  Filled 2024-02-06 (×2): qty 30, 30d supply, fill #1
  Filled 2024-03-06: qty 30, 30d supply, fill #2

## 2023-12-07 NOTE — Progress Notes (Addendum)
 [address], [place], [place], [address]                                                                  Phn. [phone]; Fax: [phone]                                                                             Date: 12/07/23  Reason for Visit: Routine HIV care.  HPI: Kevin [name] is a 68 y.o.old male with PMH as below including CHF, CKD, PAD, COPD, HTN, OSA, A fib, h/o substance use, h/o renal cell ca and NSCLC who is here for HIV fu. He was last seen by Dr Fleeta Rothman in May 2024 and was well controlled on Dovato .   Recently admitted in June for Hypertensive urgency.   Interval hx/current visit: Reports being compliant with Dovato  without missed doses or concerns. He reports seeing seeing his PCP on 7/8. Reports he has completed radiation tx for his lung cancer and being monitored by Dr Gatha.  Denies being sexually active or smoking, alcohol and use of recreational drugs. He is not ready for Hep A #2. Discussed about dental care and agreed for referral. No other complaints  10/24 Complaint with Dovato  with no missed doses or concerns. Has received Flu vaccine, Tdap, shingles vaccine. Discussed about getting second dose of Shingles.   Admitted 8/16-8/17 for COPD exacerbation and seen in the ED on 10/8 for COPD exacerbation.  Was seen by Vascular 10/7 for intermittent claudication. Seen by Oncology on 9/25 for surveillance of NSCLC/pulmonary nodules. Seen by Nephrology on 9/5. Seen by Podiatry on 10/23.   No other concerns.   ROS: As stated in above HPI; all other systems were reviewed and are otherwise negative unless noted below  No reported fever / chills, night sweats, unintentional weight loss, acute visual change, odynophagia, chest pain/pressure, new or worsened SOB or WOB,  nausea, vomiting, diarrhea, dysuria, GU discharge, syncope, seizures, red/hot swollen joints, hallucinations / delusions, rashes, new allergies, unusual / excessive bleeding, swollen lymph nodes.  PMH/ PSH/ FamHx / Social Hx , medications and allergies reviewed and updated as appropriate; please see corresponding tab in EHR / prior notes                                        Current Outpatient Medications on File Prior to Visit  Medication Sig Dispense Refill   albuterol  (VENTOLIN  HFA) 108 (90 Base) MCG/ACT inhaler Inhale 1-2 puffs into the lungs every 6 (six) hours as needed for  wheezing or shortness of breath. 6.7 g 3   amitriptyline  (ELAVIL ) 10 MG tablet Take 1 tablet (10 mg total) by mouth at bedtime. 30 tablet 0   amLODipine -valsartan  (EXFORGE ) 10-320 MG tablet Take 1 tablet by mouth daily. 90 tablet 2   aspirin  EC (ASPIRIN  LOW DOSE) 81 MG tablet Take 1 tablet (81 mg total) by mouth daily, swallow whole 30 tablet 12   atorvastatin  (LIPITOR) 80 MG tablet Take 1 tablet (80 mg total) by mouth daily. 90 tablet 3   baclofen (LIORESAL) 10 MG tablet Take 10 mg by mouth 3 (three) times daily.     celecoxib (CELEBREX) 200 MG capsule Take 200 mg by mouth 2 (two) times daily as needed (for pain).     ciclopirox  (PENLAC ) 8 % solution Apply topically at bedtime. Apply over nail and surrounding skin. Apply daily over previous coat. After seven (7) days, may remove with alcohol and continue cycle. 6.6 mL 2   dolutegravir -lamiVUDine  (DOVATO ) 50-300 MG tablet Take 1 tablet by mouth daily. 30 tablet 5   Fluticasone -Umeclidin-Vilant (TRELEGY ELLIPTA ) 200-62.5-25 MCG/ACT AEPB Inhale 1 puff into the lungs daily. 60 each 2   hydrALAZINE  (APRESOLINE ) 25 MG tablet Take 1 tablet (25 mg total) by mouth 2 (two) times daily. 180 tablet 3   hydrOXYzine  (ATARAX ) 25 MG tablet Take 25 mg by mouth 4 (four) times daily.     ipratropium-albuterol  (DUONEB) 0.5-2.5 (3) MG/3ML SOLN Take 3 mLs by nebulization every 6 (six) hours  as needed. (Patient taking differently: Take 3 mLs by nebulization every 6 (six) hours as needed (for shortness of breath or wheezing).) 360 mL 12   montelukast  (SINGULAIR ) 10 MG tablet Take 1 tablet (10 mg total) by mouth at bedtime. 30 tablet 5   nebivolol  (BYSTOLIC ) 5 MG tablet Take 1 tablet (5 mg total) by mouth daily. 90 tablet 3   oxyCODONE -acetaminophen  (PERCOCET) 10-325 MG tablet Take 1 tablet by mouth 3 (three) times daily.     predniSONE  (DELTASONE ) 50 MG tablet Take 1 tablet (50 mg total) by mouth daily. starting on 10/9 5 tablet 0   pregabalin  (LYRICA ) 50 MG capsule Take 1 capsule (50 mg total) by mouth 2 (two) times daily. 60 capsule 0   temazepam  (RESTORIL ) 30 MG capsule Take 1 capsule (30 mg total) by mouth at bedtime as needed for sleep. 30 capsule 5   Vitamin D , Ergocalciferol , (DRISDOL ) 1.25 MG (50000 UNIT) CAPS capsule Take 1 capsule (50,000 Units total) by mouth once a week for 16 weeks. Then recheck level in the office. 12 capsule 0   No current facility-administered medications on file prior to visit.    Allergies  Allergen Reactions   Isosorbide  Mononitrate [Isosorbide  Nitrate] Shortness Of Breath    Past Medical History:  Diagnosis Date   AIDS (acquired immune deficiency syndrome) (HCC) 08/17/2016   Amaurosis fugax 08/18/2022   Chronic diastolic CHF (congestive heart failure), NYHA class 3 (HCC) 01/2016   Chronic lower back pain    CKD (chronic kidney disease), stage III (HCC)    stage 3b   COPD (chronic obstructive pulmonary disease) (HCC)    Dyspnea    Gout    forearms, hands, ankles, feet (06/05/2016)   Headache    weekly (06/05/2016)   Heart murmur    never has caused any problems per pt   History of herpes zoster virus 01/2023   Hypertension    Hypertensive crisis 08/15/2017   Lipoma 07/06/2022   Neuromuscular disorder (HCC) 02/05/2023   post  herpetic neuralgia    -  Primary   OSA on CPAP    does not use CPAP   PAD (peripheral artery disease)     PAF (paroxysmal atrial fibrillation) (HCC) 01/2016   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   Pulmonary nodules 02/2023   Substance abuse (HCC)    in past   Past Surgical History:  Procedure Laterality Date   BRONCHIAL BIOPSY  06/11/2023   Procedure: BRONCHOSCOPY, WITH BIOPSY;  Surgeon: Shelah Lamar RAMAN, MD;  Location: [facility] ENDOSCOPY;  Service: Pulmonary;;   BRONCHIAL BRUSHINGS  06/11/2023   Procedure: BRONCHOSCOPY, WITH BRUSH BIOPSY;  Surgeon: Shelah Lamar RAMAN, MD;  Location: [facility] ENDOSCOPY;  Service: Pulmonary;;   BRONCHIAL NEEDLE ASPIRATION BIOPSY  06/11/2023   Procedure: BRONCHOSCOPY, WITH NEEDLE ASPIRATION BIOPSY;  Surgeon: Shelah Lamar RAMAN, MD;  Location[facility] ENDOSCOPY;  Service: Pulmonary;;   BRONCHIAL WASHINGS  06/11/2023   Procedure: IRRIGATION, BRONCHUS;  Surgeon: Shelah Lamar RAMAN, MD;  Location: [facility] ENDOSCOPY;  Service: Pulmonary;;   COLONOSCOPY N/A 04/24/2023   Procedure: COLONOSCOPY;  Surgeon: Rosalie Kitchens, MD;  Location: [facility] ENDOSCOPY;  Service: Gastroenterology;  Laterality: N/A;   IR RADIOLOGIST EVAL & MGMT  05/31/2021   IR RADIOLOGIST EVAL & MGMT  07/26/2021   LEFT HEART CATH AND CORONARY ANGIOGRAPHY N/A 10/23/2016   Procedure: LEFT HEART CATH AND CORONARY ANGIOGRAPHY;  Surgeon: Anner Alm ORN, MD;  Location: [facility] INVASIVE CV LAB;  Service: Cardiovascular;  Laterality: N/A;   LOWER EXTREMITY ANGIOGRAPHY N/A 07/17/2016   Procedure: Lower Extremity Angiography;  Surgeon: Court Dorn PARAS, MD;  Location: [facility] INVASIVE CV LAB;  Service: Cardiovascular;  Laterality: N/A;   LOWER EXTREMITY INTERVENTION N/A 06/05/2016   Procedure: Lower Extremity Intervention;  Surgeon: Dorn PARAS Court, MD;  Location: [facility] INVASIVE CV LAB;  Service: Cardiovascular;  Laterality: N/A;   PERIPHERAL VASCULAR ATHERECTOMY Right 07/17/2016   Procedure: Peripheral Vascular Atherectomy;  Surgeon: Court Dorn PARAS, MD;  Location: [facility] INVASIVE CV LAB;  Service: Cardiovascular;  Laterality: Right;  SFA   PERIPHERAL  VASCULAR INTERVENTION  06/05/2016   Procedure: Peripheral Vascular Intervention;  Surgeon: Dorn PARAS Court, MD;  Location: [facility] INVASIVE CV LAB;  Service: Cardiovascular;;  left SFA   POLYPECTOMY  04/24/2023   Procedure: POLYPECTOMY;  Surgeon: Rosalie Kitchens, MD;  Location: [facility] ENDOSCOPY;  Service: Gastroenterology;;   RADIOLOGY WITH ANESTHESIA Right 06/29/2021   Procedure: RIGHT RENAL CRYOABLATION;  Surgeon: Philip Cornet, MD;  Location: [facility] ORS;  Service: Anesthesiology;  Laterality: Right;   VIDEO BRONCHOSCOPY WITH ENDOBRONCHIAL NAVIGATION Right 06/11/2023   Procedure: VIDEO BRONCHOSCOPY WITH ENDOBRONCHIAL NAVIGATION;  Surgeon: Shelah Lamar RAMAN, MD;  Location: [facility] ENDOSCOPY;  Service: Pulmonary;  Laterality: Right;  WITH FLUORO AND BIOPSY   Social History   Socioeconomic History   Marital status: Significant Other    Spouse name: Not on file   Number of children: Not on file   Years of education: Not on file   Highest education level: Not on file  Occupational History   Not on file  Tobacco Use   Smoking status: Some Days    Current packs/day: 0.10    Average packs/day: 0.1 packs/day for 42.0 years (4.2 ttl pk-yrs)    Types: Cigarettes    Passive exposure: Never   Smokeless tobacco: Never   Tobacco comments:    Has not smoked since May 4th.2025-06/20/2023        Pt smoke 4-5 cigarettes per day  Vaping Use   Vaping status: Former   Substances:  Nicotine , Flavoring  Substance and Sexual Activity   Alcohol use: Not Currently    Alcohol/week: 2.0 standard drinks of alcohol    Types: 2 Cans of beer per week    Comment: rare   Drug use: Not Currently    Comment: rare   Sexual activity: Not Currently    Comment: declined condoms  Other Topics Concern   Not on file  Social History Narrative   Right handed   Social Drivers of Health   Financial Resource Strain: High Risk (10/05/2022)   Overall Financial Resource Strain (CARDIA)    Difficulty of Paying Living Expenses: Hard  Food  Insecurity: No Food Insecurity (09/30/2023)   Hunger Vital Sign    Worried About Running Out of Food in the Last Year: Never true    Ran Out of Food in the Last Year: Never true  Transportation Needs: No Transportation Needs (09/30/2023)   PRAPARE - Administrator, Civil Service (Medical): No    Lack of Transportation (Non-Medical): No  Physical Activity: Inactive (10/05/2022)   Exercise Vital Sign    Days of Exercise per Week: 0 days    Minutes of Exercise per Session: 0 min  Stress: Stress Concern Present (05/04/2022)   [employer] of Occupational Health - Occupational Stress Questionnaire    Feeling of Stress : To some extent  Social Connections: Moderately Isolated (09/30/2023)   Social Connection and Isolation Panel    Frequency of Communication with Friends and Family: More than three times a week    Frequency of Social Gatherings with Friends and Family: Once a week    Attends Religious Services: Never    Database administrator or Organizations: No    Attends Banker Meetings: Never    Marital Status: Married  Catering manager Violence: Not At Risk (09/30/2023)   Humiliation, Afraid, Rape, and Kick questionnaire    Fear of Current or Ex-Partner: No    Emotionally Abused: No    Physically Abused: No    Sexually Abused: No   Vitals BP (!) 149/91   Pulse (!) 54   Temp (!) 97.4 F (36.3 C) (Oral)   Resp 16   Wt 153 lb 3.2 oz (69.5 kg)   SpO2 97%   BMI 20.78 kg/m    Examination  Gen: no acute distress HEENT: Van/AT, no scleral icterus, no pale conjunctivae, hearing normal, oral mucosa moist Neck: Supple Cardio: Regular rate and rhythm Resp: Pulmonary effort normal in room air GI: nondistended GU: Musc: Extremities: No pedal edema Skin: No rashes Neuro: grossly non focal , awake, alert and oriented * 3  Psych: Calm, cooperative    Lab Results HIV 1 RNA Quant  Date Value  08/31/2023 NOT DETECTED copies/mL  07/06/2022 Not Detected  Copies/mL  12/07/2021 Not Detected Copies/mL   CD4 T Cell Abs (/uL)  Date Value  07/06/2022 842  12/07/2021 1,104  06/15/2021 918   No results found for: HIV1GENOSEQ Lab Results  Component Value Date   WBC 8.5 09/29/2023   HGB 11.2 (L) 09/29/2023   HCT 33.0 (L) 09/29/2023   MCV 95.1 09/29/2023   PLT 260 09/29/2023    Lab Results  Component Value Date   CREATININE 1.93 (H) 09/29/2023   BUN 22 09/29/2023   NA 142 09/29/2023   K 4.7 09/29/2023   CL 109 09/29/2023   CO2 21 (L) 09/29/2023   Lab Results  Component Value Date   ALT 7 09/29/2023   AST 17  09/29/2023   ALKPHOS 82 09/29/2023   BILITOT 0.5 09/29/2023    Lab Results  Component Value Date   CHOL 190 10/24/2022   TRIG 79 10/24/2022   HDL 79 10/24/2022   LDLCALC 97 10/24/2022   Lab Results  Component Value Date   HAV NON REACTIVE 07/03/2016   Lab Results  Component Value Date   HEPBSAG NEGATIVE 07/03/2016   HEPBSAB NEG 07/03/2016   Lab Results  Component Value Date   HCVAB NEGATIVE 07/03/2016   Lab Results  Component Value Date   CHLAMYDIAWP Negative 08/31/2023   N Negative 08/31/2023   No results found for: GCPROBEAPT Lab Results  Component Value Date   QUANTGOLD NEGATIVE 07/03/2016    Health Maintenance: Immunization History  Administered Date(s) Administered   Hepatitis A, Adult 08/18/2016   Hepatitis B, ADULT 08/17/2016, 09/18/2016, 08/15/2017   INFLUENZA, HIGH DOSE SEASONAL PF 10/19/2023   Influenza, Seasonal, Injecte, Preservative Fre 12/14/2022   Influenza,inj,Quad PF,6+ Mos 02/10/2016, 11/02/2016, 04/05/2019, 01/20/2022   Pneumococcal Conjugate-13 08/15/2017   Pneumococcal Polysaccharide-23 02/10/2016, 04/05/2019   Tdap 10/19/2023   Zoster Recombinant(Shingrix ) 10/19/2023   Assessment/Plan: # HIV - Adherence assessed, DDIs reviewed and side effects assessed.  - continue Dovato  1 tab po daily, refills sent  - 7/18 labs  reviewed and discussed  - 8/16 CBC and CMP reviewed  and discussed, will not repeat today - HIV RNA today  - fu in 6 months with Dr Fleeta Rothman   # HTN/CKD/CHF/A fib/COPD - fu with PCP and respective specialists  # NSCLC/Renal cell ca - fu with Oncology   # STD screening  - deferred   # Immunization  - reminded about second dose of shingles vaccine   # Health maintenance - on atorvastatin  80 mg po daily  - check Hep B surface ab to see if has immunity  - Dental referral completed - fu with PCP for colon ca screening   Patient's labs were reviewed as well as his previous records. Patients questions were addressed and answered. Safe sex counseling done.  I spent 25 minutes involved in face-to-face and non-face-to-face activities for this patient on the day of the visit. Professional time spent includes the following activities: Preparing to see the patient (review of tests), Obtaining and reviewing separately obtained history (ED note 10/8, discharge record 8/17, PCP note 9/5, Nephrology note 9/5), Oncology note 9/25, Vascular note 10/7), Performing a medically appropriate examination and evaluation , Ordering medications/labs,  Documenting clinical information in the EMR, Independently interpreting results (not separately reported), Communicating results to the patient, Counseling and educating the patient.  Of note, portions of this note may have been created with voice recognition software. While this note has been edited for accuracy, occasional wrong-word or 'sound-a-like' substitutions may have occurred due to the inherent limitations of voice recognition software.   Electronically signed by:  Annalee Orem, MD Infectious Disease Physician [facility] [address] [place], [place] [address] Phone: [phone]  Fax: [phone]

## 2023-12-10 ENCOUNTER — Ambulatory Visit: Payer: Self-pay | Admitting: Infectious Diseases

## 2023-12-10 LAB — HIV RNA, RTPCR W/R GT (RTI, PI,INT)
HIV 1 RNA Quant: NOT DETECTED {copies}/mL
HIV-1 RNA Quant, Log: NOT DETECTED {Log_copies}/mL

## 2023-12-10 LAB — HEPATITIS B SURFACE ANTIBODY,QUALITATIVE: Hep B S Ab: NONREACTIVE

## 2023-12-24 ENCOUNTER — Other Ambulatory Visit: Payer: Self-pay

## 2023-12-24 ENCOUNTER — Other Ambulatory Visit: Payer: Self-pay | Admitting: Family Medicine

## 2023-12-24 DIAGNOSIS — B20 Human immunodeficiency virus [HIV] disease: Secondary | ICD-10-CM

## 2023-12-24 DIAGNOSIS — F5101 Primary insomnia: Secondary | ICD-10-CM

## 2023-12-24 DIAGNOSIS — F411 Generalized anxiety disorder: Secondary | ICD-10-CM

## 2023-12-25 ENCOUNTER — Other Ambulatory Visit: Payer: Self-pay

## 2023-12-27 ENCOUNTER — Other Ambulatory Visit: Payer: Self-pay

## 2023-12-28 ENCOUNTER — Other Ambulatory Visit: Payer: Self-pay

## 2023-12-31 ENCOUNTER — Other Ambulatory Visit: Payer: Self-pay

## 2023-12-31 ENCOUNTER — Other Ambulatory Visit (HOSPITAL_COMMUNITY): Payer: Self-pay

## 2024-01-01 ENCOUNTER — Other Ambulatory Visit: Payer: Self-pay

## 2024-01-02 ENCOUNTER — Other Ambulatory Visit: Payer: Self-pay

## 2024-01-02 ENCOUNTER — Other Ambulatory Visit (HOSPITAL_COMMUNITY): Payer: Self-pay

## 2024-01-03 ENCOUNTER — Other Ambulatory Visit (HOSPITAL_COMMUNITY): Payer: Self-pay

## 2024-01-04 ENCOUNTER — Other Ambulatory Visit (HOSPITAL_COMMUNITY): Payer: Self-pay

## 2024-01-07 ENCOUNTER — Other Ambulatory Visit: Payer: Self-pay

## 2024-01-07 ENCOUNTER — Other Ambulatory Visit (HOSPITAL_COMMUNITY): Payer: Self-pay

## 2024-01-09 ENCOUNTER — Other Ambulatory Visit: Payer: Self-pay

## 2024-01-09 NOTE — Progress Notes (Signed)
 Specialty Pharmacy Refill Coordination Note  Kevin [name] is a 67 y.o. male contacted today regarding refills of specialty medication(s) Dolutegravir -lamiVUDine  (Dovato )   Patient requested Delivery   Delivery date: 01/14/24   Verified address: [address]  [place]  [address]   Medication will be filled on: 01/11/24

## 2024-01-09 NOTE — Progress Notes (Signed)
 Specialty Pharmacy Ongoing Clinical Assessment Note  Kevin [name] is a 68 y.o. male who is being followed by the specialty pharmacy service for RxSp HIV   Patient's specialty medication(s) reviewed today: Dolutegravir -lamiVUDine  (Dovato )   Missed doses in the last 4 weeks: 0   Patient/Caregiver did not have any additional questions or concerns.   Therapeutic benefit summary: Patient is achieving benefit   Adverse events/side effects summary: No adverse events/side effects   Patient's therapy is appropriate to: Continue    Goals Addressed             This Visit's Progress    Achieve Undetectable HIV Viral Load < 20   On track    Patient is on track. Patient will maintain adherence         Follow up: 12 months  [facility]

## 2024-01-11 ENCOUNTER — Other Ambulatory Visit: Payer: Self-pay

## 2024-01-16 ENCOUNTER — Other Ambulatory Visit: Payer: Self-pay

## 2024-01-21 ENCOUNTER — Ambulatory Visit

## 2024-01-21 DIAGNOSIS — Z0000 Encounter for general adult medical examination without abnormal findings: Secondary | ICD-10-CM

## 2024-01-21 NOTE — Patient Instructions (Signed)
 Mr. Pappalardo,  Thank you for taking the time for your Medicare Wellness Visit. I appreciate your continued commitment to your health goals. Please review the care plan we discussed, and feel free to reach out if I can assist you further.  Please note that Annual Wellness Visits do not include a physical exam. Some assessments may be limited, especially if the visit was conducted virtually. If needed, we may recommend an in-person follow-up with your provider.  Ongoing Care Seeing your primary care provider every 3 to 6 months helps us  monitor your health and provide consistent, personalized care.   Referrals If a referral was made during today's visit and you haven't received any updates within two weeks, please contact the referred provider directly to check on the status.  Recommended Screenings:  Health Maintenance  Topic Date Due   Medicare Annual Wellness Visit  Never done   Yearly kidney health urinalysis for diabetes  04/28/2023   Zoster (Shingles) Vaccine (2 of 2) 12/14/2023   Pneumococcal Vaccine for age over 63 (4 of 4 - PCV20 or PCV21) 04/04/2024   COVID-19 Vaccine (1) 02/12/2024*   Eye exam for diabetics  01/31/2024   Hemoglobin A1C  04/17/2024   Complete foot exam   09/03/2024   Yearly kidney function blood test for diabetes  09/28/2024   Colon Cancer Screening  04/23/2033   DTaP/Tdap/Td vaccine (2 - Td or Tdap) 10/18/2033   Flu Shot  Completed   Hepatitis C Screening  Completed   Meningitis B Vaccine  Aged Out   Hepatitis B Vaccine  Discontinued  *Topic was postponed. The date shown is not the original due date.       01/21/2024   10:52 AM  Advanced Directives  Does Patient Have a Medical Advance Directive? No  Would patient like information on creating a medical advance directive? No - Patient declined    Vision: Annual vision screenings are recommended for early detection of glaucoma, cataracts, and diabetic retinopathy. These exams can also reveal signs of  chronic conditions such as diabetes and high blood pressure.  Dental: Annual dental screenings help detect early signs of oral cancer, gum disease, and other conditions linked to overall health, including heart disease and diabetes.  Please see the attached documents for additional preventive care recommendations.

## 2024-01-21 NOTE — Progress Notes (Signed)
 Chief Complaint  Patient presents with   Medicare Wellness     Subjective:   Kevin [name] is a 68 y.o. male who presents for a Medicare Annual Wellness Visit.  Visit info / Clinical Intake: Medicare Wellness Visit Type:: Initial Annual Wellness Visit Persons participating in visit and providing information:: patient Medicare Wellness Visit Mode:: Telephone If telephone:: video declined Since this visit was completed virtually, some vitals may be partially provided or unavailable. Missing vitals are due to the limitations of the virtual format.: Unable to obtain vitals - no equipment If Telephone or Video please confirm:: I connected with patient using audio/video enable telemedicine. I verified patient identity with two identifiers, discussed telehealth limitations, and patient agreed to proceed. Patient Location:: home Provider Location:: office Interpreter Needed?: No Pre-visit prep was completed: yes AWV questionnaire completed by patient prior to visit?: no Living arrangements:: (!) lives alone Patient's Overall Health Status Rating: good Typical amount of pain: (!) a lot Does pain affect daily life?: (!) yes Are you currently prescribed opioids?: no  Dietary Habits and Nutritional Risks How many meals a day?: 2 Eats fruit and vegetables daily?: yes Most meals are obtained by: preparing own meals In the last 2 weeks, have you had any of the following?: none Diabetic:: no  Functional Status Activities of Daily Living (to include ambulation/medication): Independent Ambulation: Independent Medication Administration: Independent Home Management (perform basic housework or laundry): Independent Manage your own finances?: yes Primary transportation is: family / friends Concerns about vision?: no *vision screening is required for WTM* Concerns about hearing?: no  Fall Screening Falls in the past year?: 0 Number of falls in past year: 0 Was there an injury with Fall?:  0 Fall Risk Category Calculator: 0 Patient Fall Risk Level: Low Fall Risk  Fall Risk Patient at Risk for Falls Due to: Medication side effect Fall risk Follow up: Falls prevention discussed; Falls evaluation completed  Home and Transportation Safety: All rugs have non-skid backing?: yes All stairs or steps have railings?: N/A, no stairs Grab bars in the bathtub or shower?: yes Have non-skid surface in bathtub or shower?: yes Good home lighting?: yes Regular seat belt use?: yes Hospital stays in the last year:: (!) yes How many hospital stays:: 2 Reason: respiratory and blood pressure  Cognitive Assessment Difficulty concentrating, remembering, or making decisions? : yes Will 6CIT or Mini Cog be Completed: yes What year is it?: 0 points What month is it?: 0 points Give patient an address phrase to remember (5 components): [address] About what time is it?: 0 points Count backwards from 20 to 1: 0 points Say the months of the year in reverse: 4 points (did not try) Repeat the address phrase from earlier: 8 points 6 CIT Score: 12 points  Advance Directives (For Healthcare) Does Patient Have a Medical Advance Directive?: No Does patient want to make changes to medical advance directive?: No - Patient declined Would patient like information on creating a medical advance directive?: No - Patient declined  Reviewed/Updated  Reviewed/Updated: Reviewed All (Medical, Surgical, Family, Medications, Allergies, Care Teams, Patient Goals)    Allergies (verified) Isosorbide  mononitrate [isosorbide  nitrate]   Current Medications (verified) Outpatient Encounter Medications as of 01/21/2024  Medication Sig   albuterol  (VENTOLIN  HFA) 108 (90 Base) MCG/ACT inhaler Inhale 1-2 puffs into the lungs every 6 (six) hours as needed for wheezing or shortness of breath.   amitriptyline  (ELAVIL ) 10 MG tablet Take 1 tablet (10 mg total) by mouth  at bedtime.   amLODipine -valsartan  (EXFORGE )  10-320 MG tablet Take 1 tablet by mouth daily.   aspirin  EC (ASPIRIN  LOW DOSE) 81 MG tablet Take 1 tablet (81 mg total) by mouth daily, swallow whole   atorvastatin  (LIPITOR) 80 MG tablet Take 1 tablet (80 mg total) by mouth daily.   baclofen (LIORESAL) 10 MG tablet Take 10 mg by mouth 3 (three) times daily.   celecoxib (CELEBREX) 200 MG capsule Take 200 mg by mouth 2 (two) times daily as needed (for pain).   ciclopirox  (PENLAC ) 8 % solution Apply topically at bedtime. Apply over nail and surrounding skin. Apply daily over previous coat. After seven (7) days, may remove with alcohol and continue cycle.   [START ON 01/31/2024] dolutegravir -lamiVUDine  (DOVATO ) 50-300 MG tablet Take 1 tablet by mouth daily.   Fluticasone -Umeclidin-Vilant (TRELEGY ELLIPTA ) 200-62.5-25 MCG/ACT AEPB Inhale 1 puff into the lungs daily.   hydrALAZINE  (APRESOLINE ) 25 MG tablet Take 1 tablet (25 mg total) by mouth 2 (two) times daily.   hydrOXYzine  (ATARAX ) 25 MG tablet Take 25 mg by mouth 4 (four) times daily.   ipratropium-albuterol  (DUONEB) 0.5-2.5 (3) MG/3ML SOLN Take 3 mLs by nebulization every 6 (six) hours as needed. (Patient taking differently: Take 3 mLs by nebulization every 6 (six) hours as needed (for shortness of breath or wheezing).)   montelukast  (SINGULAIR ) 10 MG tablet Take 1 tablet (10 mg total) by mouth at bedtime.   nebivolol  (BYSTOLIC ) 5 MG tablet Take 1 tablet (5 mg total) by mouth daily.   Vitamin D , Ergocalciferol , (DRISDOL ) 1.25 MG (50000 UNIT) CAPS capsule Take 1 capsule (50,000 Units total) by mouth once a week for 16 weeks. Then recheck level in the office.   oxyCODONE -acetaminophen  (PERCOCET) 10-325 MG tablet Take 1 tablet by mouth 3 (three) times daily. (Patient not taking: Reported on 01/21/2024)   predniSONE  (DELTASONE ) 50 MG tablet Take 1 tablet (50 mg total) by mouth daily. starting on 10/9 (Patient not taking: Reported on 01/21/2024)   pregabalin  (LYRICA ) 50 MG capsule Take 1 capsule (50 mg  total) by mouth 2 (two) times daily. (Patient not taking: Reported on 01/21/2024)   temazepam  (RESTORIL ) 30 MG capsule Take 1 capsule (30 mg total) by mouth at bedtime as needed for sleep. (Patient not taking: Reported on 01/21/2024)   No facility-administered encounter medications on file as of 01/21/2024.    History: Past Medical History:  Diagnosis Date   AIDS (acquired immune deficiency syndrome) (HCC) 08/17/2016   Amaurosis fugax 08/18/2022   Chronic diastolic CHF (congestive heart failure), NYHA class 3 (HCC) 01/2016   Chronic lower back pain    CKD (chronic kidney disease), stage III (HCC)    stage 3b   COPD (chronic obstructive pulmonary disease) (HCC)    Dyspnea    Gout    forearms, hands, ankles, feet (06/05/2016)   Headache    weekly (06/05/2016)   Heart murmur    never has caused any problems per pt   History of herpes zoster virus 01/2023   Hypertension    Hypertensive crisis 08/15/2017   Lipoma 07/06/2022   Neuromuscular disorder (HCC) 02/05/2023   post herpetic neuralgia    -  Primary   OSA on CPAP    does not use CPAP   PAD (peripheral artery disease)    PAF (paroxysmal atrial fibrillation) (HCC) 01/2016   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   Pulmonary nodules 02/2023   Substance abuse (HCC)    in past   Past Surgical History:  Procedure Laterality Date   BRONCHIAL BIOPSY  06/11/2023   Procedure: BRONCHOSCOPY, WITH BIOPSY;  Surgeon: Shelah Lamar RAMAN, MD;  Location: [facility] ENDOSCOPY;  Service: Pulmonary;;   BRONCHIAL BRUSHINGS  06/11/2023   Procedure: BRONCHOSCOPY, WITH BRUSH BIOPSY;  Surgeon: Shelah Lamar RAMAN, MD;  Location: [facility] ENDOSCOPY;  Service: Pulmonary;;   BRONCHIAL NEEDLE ASPIRATION BIOPSY  06/11/2023   Procedure: BRONCHOSCOPY, WITH NEEDLE ASPIRATION BIOPSY;  Surgeon: Shelah Lamar RAMAN, MD;  Location: [facility] ENDOSCOPY;  Service: Pulmonary;;   BRONCHIAL WASHINGS  06/11/2023   Procedure: IRRIGATION, BRONCHUS;  Surgeon: Shelah Lamar RAMAN, MD;  Location: [facility]  ENDOSCOPY;  Service: Pulmonary;;   COLONOSCOPY N/A 04/24/2023   Procedure: COLONOSCOPY;  Surgeon: Rosalie Kitchens, MD;  Location: [facility] ENDOSCOPY;  Service: Gastroenterology;  Laterality: N/A;   IR RADIOLOGIST EVAL & MGMT  05/31/2021   IR RADIOLOGIST EVAL & MGMT  07/26/2021   LEFT HEART CATH AND CORONARY ANGIOGRAPHY N/A 10/23/2016   Procedure: LEFT HEART CATH AND CORONARY ANGIOGRAPHY;  Surgeon: Anner Alm ORN, MD;  Location: [facility] INVASIVE CV LAB;  Service: Cardiovascular;  Laterality: N/A;   LOWER EXTREMITY ANGIOGRAPHY N/A 07/17/2016   Procedure: Lower Extremity Angiography;  Surgeon: Court Dorn PARAS, MD;  Location: [facility] INVASIVE CV LAB;  Service: Cardiovascular;  Laterality: N/A;   LOWER EXTREMITY INTERVENTION N/A 06/05/2016   Procedure: Lower Extremity Intervention;  Surgeon: Dorn PARAS Court, MD;  Location: [facility] INVASIVE CV LAB;  Service: Cardiovascular;  Laterality: N/A;   PERIPHERAL VASCULAR ATHERECTOMY Right 07/17/2016   Procedure: Peripheral Vascular Atherectomy;  Surgeon: Court Dorn PARAS, MD;  Location: [facility] INVASIVE CV LAB;  Service: Cardiovascular;  Laterality: Right;  SFA   PERIPHERAL VASCULAR INTERVENTION  06/05/2016   Procedure: Peripheral Vascular Intervention;  Surgeon: Dorn PARAS Court, MD;  Location: [facility] INVASIVE CV LAB;  Service: Cardiovascular;;  left SFA   POLYPECTOMY  04/24/2023   Procedure: POLYPECTOMY;  Surgeon: Rosalie Kitchens, MD;  Location: [facility] ENDOSCOPY;  Service: Gastroenterology;;   RADIOLOGY WITH ANESTHESIA Right 06/29/2021   Procedure: RIGHT RENAL CRYOABLATION;  Surgeon: Philip Cornet, MD;  Location: [facility] ORS;  Service: Anesthesiology;  Laterality: Right;   VIDEO BRONCHOSCOPY WITH ENDOBRONCHIAL NAVIGATION Right 06/11/2023   Procedure: VIDEO BRONCHOSCOPY WITH ENDOBRONCHIAL NAVIGATION;  Surgeon: Shelah Lamar RAMAN, MD;  Location: [facility] ENDOSCOPY;  Service: Pulmonary;  Laterality: Right;  WITH FLUORO AND BIOPSY   Family History  Problem Relation Age of Onset   High blood pressure Mother    Lupus Mother     Seizures Daughter    Heart attack Daughter    Social History   Occupational History   Not on file  Tobacco Use   Smoking status: Some Days    Current packs/day: 0.10    Average packs/day: 0.1 packs/day for 42.0 years (4.2 ttl pk-yrs)    Types: Cigarettes    Passive exposure: Never   Smokeless tobacco: Never   Tobacco comments:    Has not smoked since May 4th.2025-06/20/2023        Pt smoke 4-5 cigarettes per day  Vaping Use   Vaping status: Former   Substances: Nicotine , Flavoring  Substance and Sexual Activity   Alcohol use: Not Currently    Alcohol/week: 2.0 standard drinks of alcohol    Types: 2 Cans of beer per week    Comment: rare   Drug use: Not Currently    Comment: rare   Sexual activity: Not Currently    Comment: declined condoms   Tobacco Counseling Ready to quit: Not Answered Counseling given: Not  Answered Tobacco comments: Has not smoked since May 4th.2025-06/20/2023  Pt smoke 4-5 cigarettes per day  SDOH Screenings   Food Insecurity: No Food Insecurity (01/21/2024)  Housing: Unknown (01/21/2024)  Transportation Needs: No Transportation Needs (01/21/2024)  Utilities: Not At Risk (01/21/2024)  Alcohol Screen: Low Risk  (01/21/2024)  Depression (PHQ2-9): Low Risk  (01/21/2024)  Financial Resource Strain: Low Risk  (01/21/2024)  Physical Activity: Inactive (01/21/2024)  Social Connections: Socially Isolated (01/21/2024)  Stress: No Stress Concern Present (01/21/2024)  Tobacco Use: High Risk (01/21/2024)  Health Literacy: Adequate Health Literacy (01/21/2024)   See flowsheets for full screening details  Depression Screen Depression Screening Exception Documentation Depression Screening Exception:: Patient refusal  PHQ 2 & 9 Depression Scale- Over the past 2 weeks, how often have you been bothered by any of the following problems? Little interest or pleasure in doing things: 0 Feeling down, depressed, or hopeless (PHQ Adolescent also includes...irritable):  0 PHQ-2 Total Score: 0 Trouble falling or staying asleep, or sleeping too much: 3 Feeling tired or having little energy: 0 Poor appetite or overeating (PHQ Adolescent also includes...weight loss): 0 Feeling bad about yourself - or that you are a failure or have let yourself or your family down: 0 Trouble concentrating on things, such as reading the newspaper or watching television (PHQ Adolescent also includes...like school work): 0 Moving or speaking so slowly that other people could have noticed. Or the opposite - being so fidgety or restless that you have been moving around a lot more than usual: 0 Thoughts that you would be better off dead, or of hurting yourself in some way: 0 PHQ-9 Total Score: 3 If you checked off any problems, how difficult have these problems made it for you to do your work, take care of things at home, or get along with other people?: Not difficult at all  Depression Treatment Depression Interventions/Treatment : [id number] Score <4 Follow-up Not Indicated     Goals Addressed             This Visit's Progress    Patient Stated       01/21/2024, denies goals             Objective:    Today's Vitals   There is no height or weight on file to calculate BMI.  Hearing/Vision screen Hearing Screening - Comments:: Denies hearing issues Vision Screening - Comments:: Regular eye exams, [facility] Immunizations and Health Maintenance Health Maintenance  Topic Date Due   Diabetic kidney evaluation - Urine ACR  04/28/2023   Zoster Vaccines- Shingrix  (2 of 2) 12/14/2023   Pneumococcal Vaccine: 50+ Years (4 of 4 - PCV20 or PCV21) 04/04/2024   COVID-19 Vaccine (1) 02/12/2024 (Originally 01/14/1961)   OPHTHALMOLOGY EXAM  01/31/2024   HEMOGLOBIN A1C  04/17/2024   FOOT EXAM  09/03/2024   Diabetic kidney evaluation - eGFR measurement  09/28/2024   Medicare Annual Wellness (AWV)  01/20/2025   Colonoscopy  04/23/2033   DTaP/Tdap/Td (2 - Td or Tdap) 10/18/2033    Influenza Vaccine  Completed   Hepatitis C Screening  Completed   Meningococcal B Vaccine  Aged Out   Hepatitis B Vaccines 19-59 Average Risk  Discontinued        Assessment/Plan:  This is a routine wellness examination for Prather.  Patient Care Team: [name], MD as PCP - General (Family Medicine) Mona Vinie BROCKS, MD as PCP - Cardiology (Cardiology) Skeet Juliene SAUNDERS, DO as Consulting Physician (Neurology) Prentis Duwaine BROCKS, RN  as Oncology Nurse Navigator [name], NP as Nurse Practitioner (Cardiology) Gershon Donnice SAUNDERS, DPM as Consulting Physician (Podiatry) Sherrod Sherrod, MD as Consulting Physician (Oncology)  I have personally reviewed and noted the following in the patient's chart:   Medical and social history Use of alcohol, tobacco or illicit drugs  Current medications and supplements including opioid prescriptions. Functional ability and status Nutritional status Physical activity Advanced directives List of other physicians Hospitalizations, surgeries, and ER visits in previous 12 months Vitals Screenings to include cognitive, depression, and falls Referrals and appointments  No orders of the defined types were placed in this encounter.  In addition, I have reviewed and discussed with patient certain preventive protocols, quality metrics, and best practice recommendations. A written personalized care plan for preventive services as well as general preventive health recommendations were provided to patient.   Ardella FORBES Dawn, LPN   [id number]   Return in 1 year (on 01/20/2025).  After Visit Summary: (MyChart) Due to this being a telephonic visit, the after visit summary with patients personalized plan was offered to patient via MyChart   Nurse Notes: Due for second shingles vaccine.

## 2024-01-23 ENCOUNTER — Ambulatory Visit: Admitting: Family Medicine

## 2024-01-23 NOTE — Progress Notes (Incomplete)
 Assessment & Plan   Assessment/Plan:    Problem List Items Addressed This Visit   None       Assessment and Plan               There are no discontinued medications.  No follow-ups on file.        Subjective:   Encounter date: 01/23/2024  Kevin [name] is a 68 y.o. male who has Shortness of breath; Paroxysmal A-fib (HCC); Tobacco abuse; COPD with chronic bronchitis and emphysema (HCC); Blurry vision, bilateral; Hypertensive cardiovascular disease; Essential hypertension; OSA (obstructive sleep apnea); Peripheral arterial disease; CKD stage 3b, GFR 30-44 ml/min (HCC) - baseline SCr 1.8-2.2; Claudication in peripheral vascular disease; HIV (human immunodeficiency virus infection) (HCC); Easy bruising; CAD (coronary artery disease); Hypertensive urgency; Callus of foot; Acute respiratory failure with hypoxia (HCC); Right renal mass; Renal lesion; Chronic heart failure with preserved ejection fraction (HFpEF) (HCC); HLD (hyperlipidemia); Elevated troponin; Chronic low back pain; Lipoma; Lung nodule, multiple; Resistant hypertension; Skin nodule; Anxiety; COPD exacerbation (HCC); COPD with acute exacerbation (HCC); Left renal mass; HZV (herpes zoster virus) post herpetic neuralgia; 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   He  has a past medical history of AIDS (acquired immune deficiency syndrome) (HCC) (08/17/2016), Amaurosis fugax (08/18/2022), Chronic diastolic CHF (congestive heart failure), NYHA class 3 (HCC) (01/2016), Chronic lower back pain, CKD (chronic kidney disease), stage III (HCC),  COPD (chronic obstructive pulmonary disease) (HCC), Dyspnea, Gout, Headache, Heart murmur, History of herpes zoster virus (01/2023), Hypertension, Hypertensive crisis (08/15/2017), Lipoma (07/06/2022), Neuromuscular disorder (HCC) (02/05/2023), OSA on CPAP, PAD (peripheral artery disease), PAF (paroxysmal atrial fibrillation) (HCC) (01/2016), 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 .   Discussed the use of AI scribe software for clinical note transcription with the patient, who gave verbal consent to proceed.  History of Present Illness          Peripheral neuropathy - At last visit, 10/19/2023, he endorsed burning sensation in feet.  - Had tried Gabapentin , which was ineffective.  - Started on pregabalin  (Lyrica ) 50 mg twice a day. - Last A1c 5.5% in September.    Generalized anxiety disorder - Around the time he was last seen in this office, he'd been hospitalized, exacerbating his anxiety.  - Currently on buspirone  and pregabalin .   Insomnia - At last office visit, he endorsed difficulty sleeping,  - Currently using temazepam  and was started on amitriptyline  10 mg at bedtime  ROS  Past Surgical History:  Procedure Laterality Date   BRONCHIAL BIOPSY  06/11/2023   Procedure: BRONCHOSCOPY, WITH BIOPSY;  Surgeon: Shelah Lamar RAMAN, MD;  Location: [facility] ENDOSCOPY;  Service: Pulmonary;;   BRONCHIAL BRUSHINGS  06/11/2023   Procedure: BRONCHOSCOPY, WITH BRUSH BIOPSY;  Surgeon: Shelah Lamar RAMAN, MD;  Location: [facility] ENDOSCOPY;  Service: Pulmonary;;   BRONCHIAL NEEDLE ASPIRATION BIOPSY  06/11/2023   Procedure: BRONCHOSCOPY, WITH NEEDLE ASPIRATION BIOPSY;  Surgeon: Shelah Lamar RAMAN, MD;  Location: [facility] ENDOSCOPY;  Service: Pulmonary;;   BRONCHIAL WASHINGS  06/11/2023   Procedure: IRRIGATION, BRONCHUS;  Surgeon: Shelah Lamar RAMAN, MD;  Location: [facility] ENDOSCOPY;  Service:  Pulmonary;;   COLONOSCOPY N/A 04/24/2023   Procedure: COLONOSCOPY;  Surgeon: Rosalie Kitchens, MD;  Location: [facility] ENDOSCOPY;  Service: Gastroenterology;  Laterality: N/A;   IR RADIOLOGIST EVAL & MGMT  05/31/2021   IR RADIOLOGIST EVAL & MGMT  07/26/2021   LEFT HEART CATH AND CORONARY ANGIOGRAPHY N/A 10/23/2016   Procedure: LEFT HEART CATH AND CORONARY ANGIOGRAPHY;  Surgeon: Anner Alm ORN, MD;  Location: [facility] INVASIVE CV LAB;  Service: Cardiovascular;  Laterality: N/A;   LOWER EXTREMITY ANGIOGRAPHY N/A 07/17/2016   Procedure: Lower Extremity Angiography;  Surgeon: Court Dorn PARAS, MD;  Location: [facility] INVASIVE CV LAB;  Service: Cardiovascular;  Laterality: N/A;   LOWER EXTREMITY INTERVENTION N/A 06/05/2016   Procedure: Lower Extremity Intervention;  Surgeon: Dorn PARAS Court, MD;  Location: [facility] INVASIVE CV LAB;  Service: Cardiovascular;  Laterality: N/A;   PERIPHERAL VASCULAR ATHERECTOMY Right 07/17/2016   Procedure: Peripheral Vascular Atherectomy;  Surgeon: Court Dorn PARAS, MD;  Location: [facility] INVASIVE CV LAB;  Service: Cardiovascular;  Laterality: Right;  SFA   PERIPHERAL VASCULAR INTERVENTION  06/05/2016   Procedure: Peripheral Vascular Intervention;  Surgeon: Dorn PARAS Court, MD;  Location: [facility] INVASIVE CV LAB;  Service: Cardiovascular;;  left SFA   POLYPECTOMY  04/24/2023   Procedure: POLYPECTOMY;  Surgeon: Rosalie Kitchens, MD;  Location: [facility] ENDOSCOPY;  Service: Gastroenterology;;   RADIOLOGY WITH ANESTHESIA Right 06/29/2021   Procedure: RIGHT RENAL CRYOABLATION;  Surgeon: Philip Cornet, MD;  Location: [facility] ORS;  Service: Anesthesiology;  Laterality: Right;   VIDEO BRONCHOSCOPY WITH ENDOBRONCHIAL NAVIGATION Right 06/11/2023   Procedure: VIDEO BRONCHOSCOPY WITH ENDOBRONCHIAL NAVIGATION;  Surgeon: Shelah Lamar RAMAN, MD;  Location: [facility] ENDOSCOPY;  Service: Pulmonary;  Laterality: Right;  WITH FLUORO AND BIOPSY    Current Outpatient Medications on File Prior to Visit  Medication Sig Dispense Refill   albuterol  (VENTOLIN   HFA) 108 (90 Base) MCG/ACT inhaler Inhale 1-2 puffs into the lungs every 6 (six) hours as needed for wheezing or shortness of breath. 6.7 g 3   amitriptyline  (ELAVIL ) 10 MG tablet Take 1 tablet (10 mg total) by mouth at bedtime. 30 tablet 0   amLODipine -valsartan  (EXFORGE ) 10-320 MG tablet Take 1 tablet by mouth daily. 90 tablet 2   aspirin  EC (ASPIRIN  LOW DOSE) 81 MG tablet Take 1 tablet (81 mg total) by mouth daily, swallow whole 30 tablet 12   atorvastatin  (LIPITOR) 80 MG tablet Take 1 tablet (80 mg total) by mouth daily. 90 tablet 3   baclofen (LIORESAL) 10 MG tablet Take 10 mg by mouth 3 (three) times daily.     celecoxib (CELEBREX) 200 MG capsule Take 200 mg by mouth 2 (two) times daily as needed (for pain).     ciclopirox  (PENLAC ) 8 % solution Apply topically at bedtime. Apply over nail and surrounding skin. Apply daily over previous coat. After seven (7) days, may remove with alcohol and continue cycle. 6.6 mL 2   [START ON 01/31/2024] dolutegravir -lamiVUDine  (DOVATO ) 50-300 MG tablet Take 1 tablet by mouth daily. 30 tablet 5   Fluticasone -Umeclidin-Vilant (TRELEGY ELLIPTA ) 200-62.5-25 MCG/ACT AEPB Inhale 1 puff into the lungs daily. 60 each 2   hydrALAZINE  (APRESOLINE ) 25 MG tablet Take 1 tablet (25 mg total) by mouth 2 (two) times daily. 180 tablet 3   hydrOXYzine  (ATARAX ) 25 MG tablet Take 25 mg by mouth 4 (four) times daily.     ipratropium-albuterol  (DUONEB) 0.5-2.5 (3) MG/3ML SOLN Take 3 mLs by nebulization every 6 (six) hours as needed. (Patient taking differently: Take 3 mLs by nebulization  every 6 (six) hours as needed (for shortness of breath or wheezing).) 360 mL 12   montelukast  (SINGULAIR ) 10 MG tablet Take 1 tablet (10 mg total) by mouth at bedtime. 30 tablet 5   nebivolol  (BYSTOLIC ) 5 MG tablet Take 1 tablet (5 mg total) by mouth daily. 90 tablet 3   oxyCODONE -acetaminophen  (PERCOCET) 10-325 MG tablet Take 1 tablet by mouth 3 (three) times daily. (Patient not taking: Reported on  01/21/2024)     predniSONE  (DELTASONE ) 50 MG tablet Take 1 tablet (50 mg total) by mouth daily. starting on 10/9 (Patient not taking: Reported on 01/21/2024) 5 tablet 0   pregabalin  (LYRICA ) 50 MG capsule Take 1 capsule (50 mg total) by mouth 2 (two) times daily. (Patient not taking: Reported on 01/21/2024) 60 capsule 0   temazepam  (RESTORIL ) 30 MG capsule Take 1 capsule (30 mg total) by mouth at bedtime as needed for sleep. (Patient not taking: Reported on 01/21/2024) 30 capsule 5   Vitamin D , Ergocalciferol , (DRISDOL ) 1.25 MG (50000 UNIT) CAPS capsule Take 1 capsule (50,000 Units total) by mouth once a week for 16 weeks. Then recheck level in the office. 12 capsule 0   No current facility-administered medications on file prior to visit.    Family History  Problem Relation Age of Onset   High blood pressure Mother    Lupus Mother    Seizures Daughter    Heart attack Daughter     Social History   Socioeconomic History   Marital status: Significant Other    Spouse name: Not on file   Number of children: Not on file   Years of education: Not on file   Highest education level: Not on file  Occupational History   Not on file  Tobacco Use   Smoking status: Some Days    Current packs/day: 0.10    Average packs/day: 0.1 packs/day for 42.0 years (4.2 ttl pk-yrs)    Types: Cigarettes    Passive exposure: Never   Smokeless tobacco: Never   Tobacco comments:    Has not smoked since May 4th.2025-06/20/2023        Pt smoke 4-5 cigarettes per day  Vaping Use   Vaping status: Former   Substances: Nicotine , Flavoring  Substance and Sexual Activity   Alcohol use: Not Currently    Alcohol/week: 2.0 standard drinks of alcohol    Types: 2 Cans of beer per week    Comment: rare   Drug use: Not Currently    Comment: rare   Sexual activity: Not Currently    Comment: declined condoms  Other Topics Concern   Not on file  Social History Narrative   Right handed   Social Drivers of Health    Financial Resource Strain: Low Risk  (01/21/2024)   Overall Financial Resource Strain (CARDIA)    Difficulty of Paying Living Expenses: Not hard at all  Food Insecurity: No Food Insecurity (01/21/2024)   Hunger Vital Sign    Worried About Running Out of Food in the Last Year: Never true    Ran Out of Food in the Last Year: Never true  Transportation Needs: No Transportation Needs (01/21/2024)   PRAPARE - Administrator, Civil Service (Medical): No    Lack of Transportation (Non-Medical): No  Physical Activity: Inactive (01/21/2024)   Exercise Vital Sign    Days of Exercise per Week: 0 days    Minutes of Exercise per Session: 0 min  Stress: No Stress Concern Present (01/21/2024)  [employer] of Occupational Health - Occupational Stress Questionnaire    Feeling of Stress: Not at all  Social Connections: Socially Isolated (01/21/2024)   Social Connection and Isolation Panel    Frequency of Communication with Friends and Family: More than three times a week    Frequency of Social Gatherings with Friends and Family: Three times a week    Attends Religious Services: Never    Active Member of Clubs or Organizations: No    Attends Banker Meetings: Never    Marital Status: Separated  Intimate Partner Violence: Not At Risk (01/21/2024)   Humiliation, Afraid, Rape, and Kick questionnaire    Fear of Current or Ex-Partner: No    Emotionally Abused: No    Physically Abused: No    Sexually Abused: No                                                                                                  Objective:  Physical Exam: There were no vitals taken for this visit.   Physical Exam           Physical Exam  CT Chest Wo Contrast Result Date: 11/03/2023 EXAM: CT CHEST WITHOUT CONTRAST 10/29/2023 05:49:00 PM TECHNIQUE: CT of the chest was performed without the administration of intravenous contrast. Multiplanar reformatted images are provided for review.  Automated exposure control, iterative reconstruction, and/or weight based adjustment of the mA/kV was utilized to reduce the radiation dose to as low as reasonably achievable. COMPARISON: 09/30/2023 CLINICAL HISTORY: Non-small cell lung cancer (NSCLC), staging. Reason for exam: Non-small cell lung cancer (NSCLC), staging, Malignant neoplasm of unspecified part of unspecified bronchus or lung (HCC) FINDINGS: MEDIASTINUM: Heart and pericardium are unremarkable. The central airways are clear. LYMPH NODES: No mediastinal, hilar or axillary lymphadenopathy. LUNGS AND PLEURA: 8 mm posterior right lower lobe nodule (image 94), unchanged. Adjacent 10 mm branching nodular opacity (image 95), unchanged. 3 mm central right upper lobe nodule (image 49), unchanged. 2 mm posterior right upper lobe nodule (image 76) likely benign. Mild intraobular and paraseptal emphysematous changes, upper lung predominant. No focal consolidation or pulmonary edema. No pleural effusion or pneumothorax. SOFT TISSUES/BONES: No acute abnormality of the bones or soft tissues. UPPER ABDOMEN: Scarring in the posterior right upper kidney. VASCULATURE: Mild thoracic aortic atherosclerosis. IMPRESSION: 1. Two stable clustered/branching nodules in the RLL measuring up to 10 mm. While lack of hypermetabolism on prior PET is reassuring, continued attention on follow-up is suggested in 3-6 months. Electronically signed by: Pinkie Pebbles MD 11/03/2023 09:31 PM EDT RP Workstation: [id number]    Recent Results (from the past 2160 hours)  Hepatitis B surface antibody,qualitative     Status: None   Collection Time: 12/07/23 10:55 AM  Result Value Ref Range   Hep B S Ab NON-REACTIVE NON-REACTIVE  HIV RNA, RTPCR W/R GT (RTI, PI,INT)     Status: None   Collection Time: 12/07/23 10:55 AM  Result Value Ref Range   HIV 1 RNA Quant NOT DETECTED copies/mL   HIV-1 RNA Quant, Log NOT DETECTED Log copies/mL    Comment: REFERENCE RANGE:  NOT DETECTED   copies/mL           NOT DETECTED  Log copies/mL . This test was performed using Real-Time Polymerase Chain Reaction. . Reportable range is 20 to 10,000,000 copies/mL (1.30-7.00 Log copies/mL).         Beverley KATHEE Hummer, MD  I,Emily Lagle,acting as a scribe for Beverley KATHEE Hummer, MD.,have documented all relevant documentation on the behalf of Beverley KATHEE Hummer, MD.  LILLETTE Beverley KATHEE Hummer, MD, have reviewed all documentation for this visit. The documentation on 01/23/2024 for the exam, diagnosis, procedures, and orders are all accurate and complete.

## 2024-01-25 ENCOUNTER — Other Ambulatory Visit: Payer: Self-pay

## 2024-01-25 ENCOUNTER — Other Ambulatory Visit: Payer: Self-pay | Admitting: Nurse Practitioner

## 2024-01-25 ENCOUNTER — Other Ambulatory Visit (HOSPITAL_COMMUNITY): Payer: Self-pay

## 2024-01-25 ENCOUNTER — Other Ambulatory Visit: Payer: Self-pay | Admitting: Student

## 2024-01-25 DIAGNOSIS — I1A0 Resistant hypertension: Secondary | ICD-10-CM

## 2024-01-25 MED ORDER — MONTELUKAST SODIUM 10 MG PO TABS
10.0000 mg | ORAL_TABLET | Freq: Every day | ORAL | 5 refills | Status: AC
  Filled 2024-01-25: qty 30, 30d supply, fill #0
  Filled 2024-02-25: qty 30, 30d supply, fill #1

## 2024-01-25 MED ORDER — AMLODIPINE BESYLATE-VALSARTAN 10-320 MG PO TABS
1.0000 | ORAL_TABLET | Freq: Every day | ORAL | 2 refills | Status: AC
  Filled 2024-01-25: qty 90, 90d supply, fill #0
  Filled 2024-02-25: qty 30, 30d supply, fill #1

## 2024-01-27 ENCOUNTER — Other Ambulatory Visit: Payer: Self-pay

## 2024-01-29 ENCOUNTER — Other Ambulatory Visit: Payer: Self-pay

## 2024-01-31 ENCOUNTER — Other Ambulatory Visit: Payer: Self-pay

## 2024-02-01 ENCOUNTER — Other Ambulatory Visit (HOSPITAL_COMMUNITY): Payer: Self-pay

## 2024-02-01 ENCOUNTER — Telehealth: Payer: Self-pay

## 2024-02-01 NOTE — Telephone Encounter (Signed)
 Copied from CRM #[id number]. Topic: General - Other >> Jan 31, 2024  3:34 PM Rosina BIRCH wrote: Reason for CRM: patient called stating the apartment he lives in has a small toilet so they are sending the provider an email regarding changing it [phone]

## 2024-02-01 NOTE — Telephone Encounter (Signed)
 ATC Patient but unable to reach patient LVMTCB

## 2024-02-04 ENCOUNTER — Telehealth: Payer: Self-pay

## 2024-02-04 ENCOUNTER — Inpatient Hospital Stay: Attending: Internal Medicine

## 2024-02-04 ENCOUNTER — Other Ambulatory Visit: Payer: Self-pay | Admitting: Physician Assistant

## 2024-02-04 ENCOUNTER — Ambulatory Visit (HOSPITAL_COMMUNITY)
Admission: RE | Admit: 2024-02-04 | Discharge: 2024-02-04 | Disposition: A | Discharge status: Home / Self Care | Source: Ambulatory Visit | Attending: Internal Medicine | Admitting: Internal Medicine

## 2024-02-04 ENCOUNTER — Other Ambulatory Visit: Payer: Self-pay | Admitting: Internal Medicine

## 2024-02-04 DIAGNOSIS — C3490 Malignant neoplasm of unspecified part of unspecified bronchus or lung: Secondary | ICD-10-CM | POA: Insufficient documentation

## 2024-02-04 DIAGNOSIS — Z85118 Personal history of other malignant neoplasm of bronchus and lung: Secondary | ICD-10-CM | POA: Diagnosis present

## 2024-02-04 DIAGNOSIS — R918 Other nonspecific abnormal finding of lung field: Secondary | ICD-10-CM | POA: Insufficient documentation

## 2024-02-04 DIAGNOSIS — Z923 Personal history of irradiation: Secondary | ICD-10-CM | POA: Insufficient documentation

## 2024-02-04 LAB — CBC WITH DIFFERENTIAL (CANCER CENTER ONLY)
Abs Immature Granulocytes: 0.03 K/uL (ref 0.00–0.07)
Basophils Absolute: 0.1 K/uL (ref 0.0–0.1)
Basophils Relative: 1 %
Eosinophils Absolute: 0.7 K/uL — ABNORMAL HIGH (ref 0.0–0.5)
Eosinophils Relative: 8 %
HCT: 31.4 % — ABNORMAL LOW (ref 39.0–52.0)
Hemoglobin: 11.1 g/dL — ABNORMAL LOW (ref 13.0–17.0)
Immature Granulocytes: 0 %
Lymphocytes Relative: 22 %
Lymphs Abs: 1.9 K/uL (ref 0.7–4.0)
MCH: 32.7 pg (ref 26.0–34.0)
MCHC: 35.4 g/dL (ref 30.0–36.0)
MCV: 92.6 fL (ref 80.0–100.0)
Monocytes Absolute: 0.6 K/uL (ref 0.1–1.0)
Monocytes Relative: 7 %
Neutro Abs: 5.4 K/uL (ref 1.7–7.7)
Neutrophils Relative %: 62 %
Platelet Count: 283 K/uL (ref 150–400)
RBC: 3.39 MIL/uL — ABNORMAL LOW (ref 4.22–5.81)
RDW: 13.5 % (ref 11.5–15.5)
WBC Count: 8.7 K/uL (ref 4.0–10.5)
nRBC: 0 % (ref 0.0–0.2)

## 2024-02-04 LAB — CMP (CANCER CENTER ONLY)
ALT: 11 U/L (ref 0–44)
AST: 21 U/L (ref 15–41)
Albumin: 4.2 g/dL (ref 3.5–5.0)
Alkaline Phosphatase: 74 U/L (ref 38–126)
Anion gap: 9 (ref 5–15)
BUN: 22 mg/dL (ref 8–23)
CO2: 23 mmol/L (ref 22–32)
Calcium: 8.6 mg/dL — ABNORMAL LOW (ref 8.9–10.3)
Chloride: 107 mmol/L (ref 98–111)
Creatinine: 2.36 mg/dL — ABNORMAL HIGH (ref 0.61–1.24)
GFR, Estimated: 30 mL/min — ABNORMAL LOW
Glucose, Bld: 108 mg/dL — ABNORMAL HIGH (ref 70–99)
Potassium: 4.2 mmol/L (ref 3.5–5.1)
Sodium: 139 mmol/L (ref 135–145)
Total Bilirubin: 0.6 mg/dL (ref 0.0–1.2)
Total Protein: 7.2 g/dL (ref 6.5–8.1)

## 2024-02-04 NOTE — Telephone Encounter (Signed)
 Spoke with [facility] CT in regards to patients labs results of creatinine 2.36 and GFR less than 30.  Per [name], CT scan can be done without contrast.  He voiced understanding.

## 2024-02-04 NOTE — Telephone Encounter (Signed)
 Contacted patient and he informed me he needs a letter stating his toilet seat is too low and his facility will change it to a higher toilet seat. Is this something you can do Dr. [name] ?

## 2024-02-05 ENCOUNTER — Ambulatory Visit (INDEPENDENT_AMBULATORY_CARE_PROVIDER_SITE_OTHER): Admitting: Podiatry

## 2024-02-05 DIAGNOSIS — L84 Corns and callosities: Secondary | ICD-10-CM

## 2024-02-05 DIAGNOSIS — I739 Peripheral vascular disease, unspecified: Secondary | ICD-10-CM

## 2024-02-05 DIAGNOSIS — M79675 Pain in left toe(s): Secondary | ICD-10-CM

## 2024-02-05 DIAGNOSIS — B351 Tinea unguium: Secondary | ICD-10-CM | POA: Diagnosis not present

## 2024-02-05 DIAGNOSIS — M79674 Pain in right toe(s): Secondary | ICD-10-CM

## 2024-02-05 NOTE — Progress Notes (Signed)
"  °  Subjective:  Patient ID: Kevin [name], male    DOB: 1955-06-23,  MRN: [id number] Chief Complaint  Patient presents with   [facility]    Patient presents today for [facility] patient denies any tingling sensations, numbness or pain      History of Present Illness Kevin [name] is a 68 year old male with circulation issues who presents with pain due to calluses and thickened toenails, which he is not able to trim himself.  He states the miracle foot cream has been helping.  He states the calluses do hurt as they get thicker.  Does not report any open lesions.  He wore the inserts for about a week but states it did not help.  He does not have them any longer.    Objective:    Physical Exam General: AAO x3, NAD  Dermatological: The toenails are all quite hypertrophic, dystrophic with yellow discoloration with tenderness palpation mostly to the toes on the left side.  There is no edema, erythema or signs of infection.  There are no open lesions noted bilaterally but there is thick preulcerative calluses submetatarsal 5 on the left foot worse on the right.  Hyperkeratotic lesions also noted on the medial hallux on the right as well as submetatarsal 1 left.  There is no underlying ulceration or signs of infection today.  Vascular: Dorsalis Pedis artery and Posterior Tibial artery pedal pulses are decreased bilateral with immedate capillary fill time.There is no pain with calf compression, swelling, warmth, erythema.   Neruologic: Grossly intact via light touch bilateral.   Musculoskeletal: Tenderness on the hyperkeratotic lesions as well as toenails.     Assessment:   Symptomatic onychomycosis, hyperkeratotic lesions with history of PAD  Plan:  Patient was evaluated and treated and all questions answered.  Assessment and Plan Assessment & Plan  Symptomatic onychomycosis -Sharply debrided nails x 10 without any complications or bleeding.  Hyperkeratotic preulcerative calluses - Sharply  debrided hyperkeratotic lesions x 4 with any complications or bleeding.  Return in about 3 months (around 05/05/2024).  Kevin [name] DPM "

## 2024-02-05 NOTE — Telephone Encounter (Signed)
 Please disregard last message. Patient accomodation paperwork has been already signed and faxed

## 2024-02-06 ENCOUNTER — Other Ambulatory Visit: Payer: Self-pay

## 2024-02-08 ENCOUNTER — Other Ambulatory Visit (HOSPITAL_COMMUNITY): Payer: Self-pay

## 2024-02-08 ENCOUNTER — Other Ambulatory Visit: Payer: Self-pay

## 2024-02-08 NOTE — Progress Notes (Signed)
 Specialty Pharmacy Refill Coordination Note  Kevin [name] is a 68 y.o. male contacted today regarding refills of specialty medication(s) Dolutegravir -lamiVUDine  (Dovato )   Patient requested Delivery   Delivery date: 02/13/24   Verified address: [address] [place], [place], [address]   Medication will be filled on: 02/12/24

## 2024-02-11 ENCOUNTER — Inpatient Hospital Stay: Admitting: Internal Medicine

## 2024-02-11 VITALS — BP 146/84 | HR 64 | Temp 97.7°F | Resp 17 | Ht 72.0 in | Wt 155.2 lb

## 2024-02-11 DIAGNOSIS — C3490 Malignant neoplasm of unspecified part of unspecified bronchus or lung: Secondary | ICD-10-CM | POA: Diagnosis not present

## 2024-02-11 DIAGNOSIS — R918 Other nonspecific abnormal finding of lung field: Secondary | ICD-10-CM | POA: Diagnosis not present

## 2024-02-11 NOTE — Progress Notes (Signed)
 "     [facility] [phone]   [phone]  OFFICE PROGRESS NOTE  [name], MD [address] [place] [address]  DIAGNOSIS: Stage IA (T1b, N0, M0) non-small cell lung cancer presented with right lower lobe lung nodule in addition to suspicious small groundglass opacity in the right upper lobe and few other tiny pulmonary nodules diagnosed in April 2025.   PRIOR THERAPY: Curative SBRT to the right upper lobe lung nodule under the care of Dr. Dewey completed on July 24, 2023  CURRENT THERAPY: Observation.  INTERVAL HISTORY: Kevin [name] 68 y.o. male returns to the clinic today for follow-up visit.  His 2 daughters were available by phone during the visit.Discussed the use of AI scribe software for clinical note transcription with the patient, who gave verbal consent to proceed.  History of Present Illness Kevin [name] is a 68 year old male with stage 1A non-small cell lung cancer status post curative SBRT who presents for follow-up and restaging imaging.  He was diagnosed with stage 1a non-small cell lung cancer of the right upper lobe in April 2025 and completed curative stereotactic body radiation therapy in June 2025. He is currently in the observation phase and underwent repeat chest CT for restaging. Multiple small pulmonary nodules remain unchanged from prior imaging.  He reports persistent right-sided discomfort, which he attributes to prior radiation therapy, without new or worsening symptoms. He has a chronic morning productive cough with white sputum, unchanged from baseline. He denies hemoptysis, dyspnea, or other respiratory complaints.  He denies nausea and vomiting. He experienced a brief episode of mild diarrhea two weeks ago, which resolved spontaneously. He notes a weight loss of approximately seven pounds, attributed to decreased oral intake during the Christmas holiday, without significant changes in  appetite.      MEDICAL HISTORY: Past Medical History:  Diagnosis Date   AIDS (acquired immune deficiency syndrome) (HCC) 08/17/2016   Amaurosis fugax 08/18/2022   Chronic diastolic CHF (congestive heart failure), NYHA class 3 (HCC) 01/2016   Chronic lower back pain    CKD (chronic kidney disease), stage III (HCC)    stage 3b   COPD (chronic obstructive pulmonary disease) (HCC)    Dyspnea    Gout    forearms, hands, ankles, feet (06/05/2016)   Headache    weekly (06/05/2016)   Heart murmur    never has caused any problems per pt   History of herpes zoster virus 01/2023   Hypertension    Hypertensive crisis 08/15/2017   Lipoma 07/06/2022   Neuromuscular disorder (HCC) 02/05/2023   post herpetic neuralgia    -  Primary   OSA on CPAP    does not use CPAP   PAD (peripheral artery disease)    PAF (paroxysmal atrial fibrillation) (HCC) 01/2016   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  MEDICATIONS:  Current Outpatient Medications  Medication Sig Dispense Refill   albuterol  (VENTOLIN  HFA) 108 (90 Base) MCG/ACT inhaler Inhale 1-2 puffs into the lungs every 6 (six) hours as needed for wheezing or shortness of breath. 6.7 g 3   amitriptyline  (ELAVIL ) 10 MG tablet Take 1 tablet (10 mg total) by mouth at bedtime. 30 tablet 0   amLODipine -valsartan  (EXFORGE ) 10-320 MG tablet Take 1 tablet by mouth daily. 90 tablet 2   aspirin  EC (  ASPIRIN  LOW DOSE) 81 MG tablet Take 1 tablet (81 mg total) by mouth daily, swallow whole 30 tablet 12   atorvastatin  (LIPITOR) 80 MG tablet Take 1 tablet (80 mg total) by mouth daily. 90 tablet 3   baclofen (LIORESAL) 10 MG tablet Take 10 mg by mouth 3 (three) times daily.     celecoxib (CELEBREX) 200 MG capsule Take 200 mg by mouth 2 (two) times daily as needed (for pain).     ciclopirox  (PENLAC ) 8 % solution  Apply topically at bedtime. Apply over nail and surrounding skin. Apply daily over previous coat. After seven (7) days, may remove with alcohol and continue cycle. 6.6 mL 2   dolutegravir -lamiVUDine  (DOVATO ) 50-300 MG tablet Take 1 tablet by mouth daily. 30 tablet 5   Fluticasone -Umeclidin-Vilant (TRELEGY ELLIPTA ) 200-62.5-25 MCG/ACT AEPB Inhale 1 puff into the lungs daily. 60 each 2   hydrALAZINE  (APRESOLINE ) 25 MG tablet Take 1 tablet (25 mg total) by mouth 2 (two) times daily. 180 tablet 3   hydrOXYzine  (ATARAX ) 25 MG tablet Take 25 mg by mouth 4 (four) times daily.     ipratropium-albuterol  (DUONEB) 0.5-2.5 (3) MG/3ML SOLN Take 3 mLs by nebulization every 6 (six) hours as needed. (Patient taking differently: Take 3 mLs by nebulization every 6 (six) hours as needed (for shortness of breath or wheezing).) 360 mL 12   montelukast  (SINGULAIR ) 10 MG tablet Take 1 tablet (10 mg total) by mouth at bedtime. 30 tablet 5   nebivolol  (BYSTOLIC ) 5 MG tablet Take 1 tablet (5 mg total) by mouth daily. 90 tablet 3   oxyCODONE -acetaminophen  (PERCOCET) 10-325 MG tablet Take 1 tablet by mouth 3 (three) times daily.     predniSONE  (DELTASONE ) 50 MG tablet Take 1 tablet (50 mg total) by mouth daily. starting on 10/9 5 tablet 0   pregabalin  (LYRICA ) 50 MG capsule Take 1 capsule (50 mg total) by mouth 2 (two) times daily. 60 capsule 0   temazepam  (RESTORIL ) 30 MG capsule Take 1 capsule (30 mg total) by mouth at bedtime as needed for sleep. 30 capsule 5   Vitamin D , Ergocalciferol , (DRISDOL ) 1.25 MG (50000 UNIT) CAPS capsule Take 1 capsule (50,000 Units total) by mouth once a week for 16 weeks. Then recheck level in the office. 12 capsule 0   No current facility-administered medications for this visit.    SURGICAL HISTORY:  Past Surgical History:  Procedure Laterality Date   BRONCHIAL BIOPSY  06/11/2023   Procedure: BRONCHOSCOPY, WITH BIOPSY;  Surgeon: Shelah Lamar RAMAN, MD;  Location[facility] ENDOSCOPY;  Service:  Pulmonary;;   BRONCHIAL BRUSHINGS  06/11/2023   Procedure: BRONCHOSCOPY, WITH BRUSH BIOPSY;  Surgeon: Shelah Lamar RAMAN, MD;  Location: [facility] ENDOSCOPY;  Service: Pulmonary;;   BRONCHIAL NEEDLE ASPIRATION BIOPSY  06/11/2023   Procedure: BRONCHOSCOPY, WITH NEEDLE ASPIRATION BIOPSY;  Surgeon: Shelah Lamar RAMAN, MD;  Location: [facility] ENDOSCOPY;  Service: Pulmonary;;   BRONCHIAL WASHINGS  06/11/2023   Procedure: IRRIGATION, BRONCHUS;  Surgeon: Shelah Lamar RAMAN, MD;  Location: [facility] ENDOSCOPY;  Service: Pulmonary;;   COLONOSCOPY N/A 04/24/2023   Procedure: COLONOSCOPY;  Surgeon: Rosalie Kitchens, MD;  Location: [facility] ENDOSCOPY;  Service: Gastroenterology;  Laterality: N/A;   IR RADIOLOGIST EVAL & MGMT  05/31/2021   IR RADIOLOGIST EVAL & MGMT  07/26/2021   LEFT HEART CATH AND CORONARY ANGIOGRAPHY N/A 10/23/2016   Procedure: LEFT HEART CATH AND CORONARY ANGIOGRAPHY;  Surgeon: Anner Alm ORN, MD;  Location: [facility] INVASIVE CV LAB;  Service: Cardiovascular;  Laterality: N/A;  LOWER EXTREMITY ANGIOGRAPHY N/A 07/17/2016   Procedure: Lower Extremity Angiography;  Surgeon: Court Dorn PARAS, MD;  Location: [facility] INVASIVE CV LAB;  Service: Cardiovascular;  Laterality: N/A;   LOWER EXTREMITY INTERVENTION N/A 06/05/2016   Procedure: Lower Extremity Intervention;  Surgeon: Dorn PARAS Court, MD;  Location: [facility] INVASIVE CV LAB;  Service: Cardiovascular;  Laterality: N/A;   PERIPHERAL VASCULAR ATHERECTOMY Right 07/17/2016   Procedure: Peripheral Vascular Atherectomy;  Surgeon: Court Dorn PARAS, MD;  Location: [facility] INVASIVE CV LAB;  Service: Cardiovascular;  Laterality: Right;  SFA   PERIPHERAL VASCULAR INTERVENTION  06/05/2016   Procedure: Peripheral Vascular Intervention;  Surgeon: Dorn PARAS Court, MD;  Location[facility] INVASIVE CV LAB;  Service: Cardiovascular;;  left SFA   POLYPECTOMY  04/24/2023   Procedure: POLYPECTOMY;  Surgeon: Rosalie Kitchens, MD;  Location: [facility] ENDOSCOPY;  Service: Gastroenterology;;   RADIOLOGY WITH ANESTHESIA Right 06/29/2021    Procedure: RIGHT RENAL CRYOABLATION;  Surgeon: Philip Cornet, MD;  Location: [facility] ORS;  Service: Anesthesiology;  Laterality: Right;   VIDEO BRONCHOSCOPY WITH ENDOBRONCHIAL NAVIGATION Right 06/11/2023   Procedure: VIDEO BRONCHOSCOPY WITH ENDOBRONCHIAL NAVIGATION;  Surgeon: Shelah Lamar RAMAN, MD;  Location: [facility] ENDOSCOPY;  Service: Pulmonary;  Laterality: Right;  WITH FLUORO AND BIOPSY    REVIEW OF SYSTEMS:  A comprehensive review of systems was negative except for: Respiratory: positive for cough and pleurisy/chest pain   PHYSICAL EXAMINATION: General appearance: alert, cooperative, fatigued, and no distress Head: Normocephalic, without obvious abnormality, atraumatic Neck: no adenopathy, no JVD, supple, symmetrical, trachea midline, and thyroid  not enlarged, symmetric, no tenderness/mass/nodules Lymph nodes: Cervical, supraclavicular, and axillary nodes normal. Resp: clear to auscultation bilaterally Back: symmetric, no curvature. ROM normal. No CVA tenderness. Cardio: regular rate and rhythm, S1, S2 normal, no murmur, click, rub or gallop GI: soft, non-tender; bowel sounds normal; no masses,  no organomegaly Extremities: extremities normal, atraumatic, no cyanosis or edema  ECOG PERFORMANCE STATUS: 1 - Symptomatic but completely ambulatory  Blood pressure (!) 146/84, pulse 64, temperature 97.7 F (36.5 C), temperature source Temporal, resp. rate 17, height 6' (1.829 m), weight 155 lb 3.2 oz (70.4 kg), SpO2 98%.  LABORATORY DATA: Lab Results  Component Value Date   WBC 8.7 02/04/2024   HGB 11.1 (L) 02/04/2024   HCT 31.4 (L) 02/04/2024   MCV 92.6 02/04/2024   PLT 283 02/04/2024      Chemistry      Component Value Date/Time   NA 139 02/04/2024 0837   NA 138 07/10/2018 1103   K 4.2 02/04/2024 0837   CL 107 02/04/2024 0837   CO2 23 02/04/2024 0837   BUN 22 02/04/2024 0837   BUN 17 07/10/2018 1103   CREATININE 2.36 (H) 02/04/2024 0837   CREATININE 2.03 (H) 07/06/2022 1145       Component Value Date/Time   CALCIUM  8.6 (L) 02/04/2024 0837   ALKPHOS 74 02/04/2024 0837   AST 21 02/04/2024 0837   ALT 11 02/04/2024 0837   BILITOT 0.6 02/04/2024 0837       RADIOGRAPHIC STUDIES: CT CHEST WO CONTRAST Result Date: 02/11/2024 EXAM: CT CHEST WITHOUT CONTRAST 02/04/2024 10:22:00 AM TECHNIQUE: CT of the chest was performed without the administration of intravenous contrast. Multiplanar reformatted images are provided for review. Automated exposure control, iterative reconstruction, and/or weight based adjustment of the mA/kV was utilized to reduce the radiation dose to as low as reasonably achievable. COMPARISON: 10/29/2023 CLINICAL HISTORY: Non-small cell lung cancer (NSCLC), staging. * Tracking Code: BO * FINDINGS: MEDIASTINUM: Heart and pericardium are  unremarkable. Mild aortic arch atheromatous vascular calcification. The central airways are clear. LYMPH NODES: No mediastinal, hilar or axillary lymphadenopathy. LUNGS AND PLEURA: Panlike scarring with associated nodularity in the right lower lobe, the nodularity measures up to 6 mm in thickness, previously 7 mm. Airway thickening suggest bronchitis or reactive airways disease. Stable 3 mm right upper lobe nodule on image 45 series 6. Ground glass density 8 x 6 x 6 mm (volume = 200 mm\S\3) nodule likely in the right middle lobe on image 77 series 6 appear stable. No focal consolidation or pulmonary edema. No pleural effusion or pneumothorax. SOFT TISSUES/BONES: No acute abnormality of the bones or soft tissues. UPPER ABDOMEN: Scarring of the right kidney posteriorly. Limited images of the upper abdomen demonstrates no acute abnormality. IMPRESSION: 1. Stable 3 mm right upper lobe nodule and stable 8 x 6 x 6 mm ground-glass nodule in the right middle lobe. 2. Right lower lobe scarring with associated nodularity measuring up to 6 mm in thickness, previously 7 mm. 3. Airway wall thickening, consistent with bronchitis or reactive airways  disease. 4. Chronic right renal scarring and mild aortic arch atherosclerotic calcification. Electronically signed by: Ryan Salvage MD 02/11/2024 08:59 AM EST RP Workstation: [id number]    ASSESSMENT AND PLAN: This is a very pleasant 68 years old African-American male with stage IA (T1b, N0, M0) non-small cell lung cancer presented with right lower lobe lung nodule in addition to suspicious small groundglass opacity in the right upper lobe and few other tiny pulmonary nodules diagnosed in April 2025.  He is status post curative SBRT under the care of Dr. Dewey completed on July 24, 2023. He had repeat CT scan of the chest performed recently.  I personally independently reviewed the scan and discussed the result with the patient and his daughters today.  His scan showed no concerning findings for disease progression. Assessment and Plan Assessment & Plan Stage 1a non-small cell lung cancer of the right upper lobe, post-curative SPRT Status post curative stereotactic body radiation therapy completed June 2025. Currently in observation with no evidence of recurrence or progression on recent chest CT. No acute bone or soft tissue abnormalities. He reports persistent right-sided pain attributed to prior radiation, without new or worsening respiratory or gastrointestinal symptoms. Weight loss attributed to decreased intake during the holidays. Disease remains in remission with stable imaging findings. - Reviewed recent chest CT results with him and his daughters. - Performed physical examination. - Scheduled follow-up visit in six months.  Stable pulmonary nodules under observation Multiple small pulmonary nodules have remained unchanged on serial imaging over the past three months, without interval growth or features concerning for malignancy or other serious pathology. - Reviewed stability of pulmonary nodules on chest CT. - Scheduled repeat chest CT in six months for surveillance. The patient was  advised to call immediately if he has any other concerning symptoms in the interval.  All questions were answered. The patient knows to call the clinic with any problems, questions or concerns. We can certainly see the patient much sooner if necessary. The total time spent in the appointment was 20 minutes including review of chart and various tests results, discussions about plan of care and coordination of care plan .   Disclaimer: This note was dictated with voice recognition software. Similar sounding words can inadvertently be transcribed and may not be corrected upon review.        "

## 2024-02-11 NOTE — Progress Notes (Signed)
 "  Subjective:    Patient ID: Kevin [name], male    DOB: October 17, 1955, 68 y.o.   MRN: [id number]  Dr Asuncion HPI 68 year old patient with a history of tobacco use (40+ pk-yrs) and COPD, renal cell cancer, HIV on therapy, hypertension and atrial fibrillation with diastolic CHF, OSA on CPAP, gout.  He has been seen by Dr. Gladis in our office for his COPD and also pulmonary nodular disease on a CT chest 11/2020.  Repeat CT scan of the chest was done as below as well as subsequent PET scan.  CT scan of the chest 09/06/2022 reviewed by me shows multiple stable right sided solid and subsolid pulmonary nodules that were stable or regressed.  2 nodules had increased in size including a 7 mm solid right upper lobe nodule, 15 mm mixed density right lower lobe nodule.  This prompted PET scan. He has been experiencing episodes of acute dyspnea, has been back and forth to the ED multiple times this year. Was started on Trelegy in June. Can happen w exertion, chores or at rest. Does not wake him from sleep. He coughs through the day, produces mucous in the am. ? Whether there may be a component of panic / anxiety. Albuterol  nebs 3x a day. Unclear triggers. Does have trouble in hot air.   PET scan 09/21/2022 reviewed by me, shows mild hypermetabolism within the mediastinal nodes with SUV max 4.1 of unclear significance.  The right upper lobe peribronchovascular nodules measured at 5 mm.  None of the groundglass nodules had any hypermetabolism.  There was some nonspecific hypermetabolism in the right apex as well as inferior lingula and an area of some minimal airspace disease that was new compared with the CT done 7/24  [name] 11/29/2022 --68 year old smoker with COPD, HIV, renal cell cancer, hypertension, A-fib with diastolic dysfunction, OSA on CPAP.  He is preparing to undergo colonoscopy with [facility] gastroenterology and needs preoperative surgical evaluation.  I saw him in August for an abnormal CT scan of the chest with  small right upper lobe and right lower lobe pulmonary nodules.  These look as they may have evolved on CT but they were negative on PET scan.  We plan for repeat imaging in January 2025 he was in the emergency department 11/18/2022 with acute exacerbation of COPD, received an extended nebulizer treatment, steroids and was sent out on prednisone  and doxycycline .  His maintenance medication has been Trelegy, albuterol  or DuoNeb as needed. He has been off the trelegy for over a month. He is still smoking a few cig a day. He is feeling better.   PFT from 11/24/22 reviewed and shows severe obstruction without a bronchodilator response, FEV1 42% predicted.  Lung volumes are hyperinflated.  Diffusion capacity decreased and corrects to normal range when adjusted for alveolar volume ================================================== HPI- M followed for OSA, complicated by Smoker(2 cigs/day) with COPD (Dr Shelah), HIV, renal cell cancer, hypertension, A-fib with diastolic dysfunction, OSA on CPAP NPSG 05/07/19- AHI 62.9/hr, desat to 92%, BIPAP to 18/14. Frequent Centrals> recommended AutoServoVentilation study. Body weight 180 lbs ASV titration 06/10/19- EPAP min 7, Max 7, PS min5, Max 20, Breath rate AutoBrMode/ [employer] > not using PFT from 11/24/22 reviewed and shows severe obstruction without a bronchodilator response, FEV1 42% predicted.  Lung volumes are hyperinflated.  Diffusion capacity decreased and corrects to normal range when adjusted for alveolar volume =======================================================================================================     08/07/23- 68 yoM light Smoker followed for OSA, Irregular Sleep Schedule, complicated by Smoker(2 cigs/day)  with COPD (Dr Shelah), HIV, RLL NSCCA/ XRT, Hx R renal cell cancer/ ablation,, New L renal mass, hypertension, A-fib with diastolic dysfunction, OSA on CPAP  Neb Duoneb, Albuterol  hfa, Trelegy 200, Singulair ,  ASV titration 06/10/19- EPAP  min 7, Max 7, PS min5, Max 20, Breath rate AutoBrMode/ [employer]  ASV due to frequent Centrals. We had asked [employer] to refit mask. Oncology/ Dr Sherrod has CT chest f/u in September. Recent hosp for hypertensive emergency- suspected noncompliance.  Discussed the use of AI scribe software for clinical note transcription with the patient, who gave verbal consent to proceed.  History of Present Illness   Kevin [name] is a 68 year old male with lung cancer and hypertension who presents with sleep disturbances and breathing issues.  He completed three radiation therapy sessions for lung cancer in the lower right lung about a week and a half ago.  He experiences sleep disturbances with episodes of drifting off and snapping back awake, leading to sleepless nights. He discontinued the use of his ventilator machine at night due to ineffectiveness. He will try again now that we are prescribing temazepam  as of this visit.  He uses an albuterol  rescue inhaler and Trelegy as a maintenance inhaler once daily, with approximately seven doses remaining for Trelegy. He was without his albuterol  inhaler until a prescription was called in this morning.     Assessment and Plan:   NSCCA Lung cancer, right lower lobe Completed radiation therapy. Oncology care by Dr. Deatrice, radiation by Dr. Dewey. - Follow-up CT scan in September.  Sleep disturbance Difficulty staying asleep affects NIV ventilator use. Discussed temazepam  for sleep aid without next-day grogginess. - Prescribe temazepam  to aid sleep and improve ventilator use.   COPD mixed type -inhalers refilled  Hypertension Blood pressure 151/80. Managed by primary doctor Beverley Hummer. - Coordinate hypertension management with primary doctor, Beverley Hummer.    02/12/24- 68 yoM light Smoker followed for OSA- no longer using ASV, Irregular Sleep Schedule, complicated by Smoker(2 cigs/day) with COPD (Dr Shelah), HIV, RLL NSCCA/ XRT, Hx R renal cell  cancer/ ablation,, New L renal mass, hypertension, A-fib with diastolic dysfunction, OSA on CPAP  Neb Duoneb, Albuterol  hfa, Trelegy 200, Singulair ,  ASV titration 06/10/19- EPAP min 7, Max 7, PS min5, Max 20, Breath rate AutoBrMode/ [employer]  ASV due to frequent Centrals. We had asked [employer] to refit mask.  He is not using it. Oncology/ Dr Sherrod has CT chest f/u- stable per Oncology at of 02/11/24 after curative XRT. Stable baseline chronic productive cough. Body weight today 153 lbs Discussed the use of AI scribe software for clinical note transcription with the patient, who gave verbal consent to proceed.  History of Present Illness   CEFERINO LANG is a 68 year old male with obstructive sleep apnea who presents for follow-up.  He has an irregular sleep schedule and had been on an ASV noninvasive ventilator for nocturnal respiratory support through [employer] but is no longer using the ASV machine. He feels pretty good overall but has tiredness, especially after poor sleep, and sometimes sleeps 8 to 9 hours during the day to catch up. He takes temazepam  30 mg at bedtime on some nights but limits use because it causes daytime drowsiness. We talked again about sleep hygiene, Sleep Apnea and options.  He has COPD and still smokes a few cigarettes per day. He uses an albuterol  rescue inhaler as needed for shortness of breath and recently received a refill. He uses Trelegy in  the mornings but not consistently. He has had no recent hospitalizations or urgent care visits for respiratory issues.  He previously worked outdoors in development worker, international aid for about forty years and now prefers to stay indoors during bad weather.     CT chest 02/04/24 IMPRESSION: 1. Stable 3 mm right upper lobe nodule and stable 8 x 6 x 6 mm ground-glass nodule in the right middle lobe. 2. Right lower lobe scarring with associated nodularity measuring up to 6 mm in thickness, previously 7 mm. 3. Airway wall thickening, consistent  with bronchitis or reactive airways disease. 4. Chronic right renal scarring and mild aortic arch atherosclerotic calcification.  Assessment and Plan:    Chronic obstructive pulmonary disease- mixed type COPD with current inhaler regimen. Continues smoking. - Refilled albuterol  rescue inhaler for one year. - Continue Trelegy as needed.  Obstructive sleep apnea ASV machine not in use. Reports fatigue, likely due to poor sleep habits and sleep apnea.  Insomnia Temazepam  causing next-day sedation. Discussed medication adjustment. - Changed temazepam  prescription to 15 mg capsules, 60 tablets, for flexible dosing. - Advised taking temazepam  20 minutes before bedtime to reduce next-day sedation.     ROS-see HPI   + = positive Constitutional:    weight loss, night sweats, fevers, chills, fatigue, lassitude.   HEENT:    headaches, difficulty swallowing, tooth/dental problems, sore throat,       sneezing, itching, ear ache, nasal congestion, post nasal drip, snoring CV:    chest pain, orthopnea, PND, swelling in lower extremities, anasarca,     dizziness, +palpitations Resp:   +shortness of breath with exertion or at rest.                productive cough,   non-productive cough, coughing up of blood.              change in color of mucus.  wheezing.   Skin:    rash or lesions. GI:  No-   heartburn, indigestion, abdominal pain, nausea, vomiting, diarrhea,                 change in bowel habits, loss of appetite GU: dysuria, change in color of urine, no urgency or frequency.   flank pain. MS:   joint pain, stiffness, decreased range of motion, back pain. Neuro-     nothing unusual Psych:  change in mood or affect.  depression or +anxiety.   memory loss.  OBJ- Physical Exam     General- Alert, Oriented, Affect-appropriate, Distress- none acute, ++slender Skin- rash-none, lesions- none, excoriation- none Lymphadenopathy- none Head- atraumatic            Eyes- Gross vision intact, PERRLA,  conjunctivae and secretions clear            Ears- Hearing, canals-normal            Nose- Clear, no-Septal dev, mucus, polyps, erosion, perforation             Throat- Mallampati II , mucosa clear , drainage- none, tonsils- atrophic Neck- flexible , trachea midline, no stridor, thyroid  nl, carotid no bruit Chest - symmetrical excursion , unlabored           Heart/CV- RRR , no murmur , no gallop  , no rub, nl s1 s2                           - JVD- none , edema- none, stasis changes- none, varices-  none           Lung- clear to P&A/ +diminished, wheeze- none, cough- none , dullness-none, rub- none           Chest wall-  Abd-  Br/ Gen/ Rectal- Not done, not indicated Extrem- cyanosis- none, clubbing, none, atrophy- none, strength- nl Neuro- grossly intact to observation    "

## 2024-02-12 ENCOUNTER — Ambulatory Visit: Admitting: Internal Medicine

## 2024-02-12 ENCOUNTER — Other Ambulatory Visit: Payer: Self-pay

## 2024-02-12 ENCOUNTER — Other Ambulatory Visit (HOSPITAL_COMMUNITY): Payer: Self-pay

## 2024-02-12 ENCOUNTER — Encounter: Payer: Self-pay | Admitting: Internal Medicine

## 2024-02-12 DIAGNOSIS — G4700 Insomnia, unspecified: Secondary | ICD-10-CM | POA: Diagnosis not present

## 2024-02-12 DIAGNOSIS — F17210 Nicotine dependence, cigarettes, uncomplicated: Secondary | ICD-10-CM | POA: Diagnosis not present

## 2024-02-12 DIAGNOSIS — G4733 Obstructive sleep apnea (adult) (pediatric): Secondary | ICD-10-CM

## 2024-02-12 DIAGNOSIS — J449 Chronic obstructive pulmonary disease, unspecified: Secondary | ICD-10-CM

## 2024-02-12 MED ORDER — ALBUTEROL SULFATE HFA 108 (90 BASE) MCG/ACT IN AERS
1.0000 | INHALATION_SPRAY | Freq: Four times a day (QID) | RESPIRATORY_TRACT | 12 refills | Status: AC | PRN
  Filled 2024-02-12: qty 6.7, 25d supply, fill #0

## 2024-02-12 MED ORDER — TEMAZEPAM 15 MG PO CAPS
15.0000 mg | ORAL_CAPSULE | Freq: Every evening | ORAL | 5 refills | Status: AC | PRN
  Filled 2024-02-12: qty 30, 30d supply, fill #0

## 2024-02-12 NOTE — Patient Instructions (Signed)
 Glad you are doing ok off your ASV breathing machine at night.  Script sent to refill the albuterol  inhaler. Call for Trelegy refill when needed.  Script sent changing temazepam  to 15 mg. See if one is enough to help you sleep without the morning carry-over. You can take 2 if needed.

## 2024-02-16 ENCOUNTER — Other Ambulatory Visit: Payer: Self-pay

## 2024-02-16 ENCOUNTER — Emergency Department (HOSPITAL_BASED_OUTPATIENT_CLINIC_OR_DEPARTMENT_OTHER)

## 2024-02-16 ENCOUNTER — Emergency Department (HOSPITAL_BASED_OUTPATIENT_CLINIC_OR_DEPARTMENT_OTHER)
Admission: EM | Admit: 2024-02-16 | Discharge: 2024-02-16 | Disposition: A | Discharge status: Home / Self Care | Attending: Emergency Medicine | Admitting: Emergency Medicine

## 2024-02-16 ENCOUNTER — Emergency Department (HOSPITAL_BASED_OUTPATIENT_CLINIC_OR_DEPARTMENT_OTHER): Admitting: Radiology

## 2024-02-16 ENCOUNTER — Encounter (HOSPITAL_BASED_OUTPATIENT_CLINIC_OR_DEPARTMENT_OTHER): Payer: Self-pay | Admitting: *Deleted

## 2024-02-16 DIAGNOSIS — Z7982 Long term (current) use of aspirin: Secondary | ICD-10-CM | POA: Diagnosis not present

## 2024-02-16 DIAGNOSIS — J441 Chronic obstructive pulmonary disease with (acute) exacerbation: Secondary | ICD-10-CM | POA: Diagnosis not present

## 2024-02-16 DIAGNOSIS — J449 Chronic obstructive pulmonary disease, unspecified: Secondary | ICD-10-CM | POA: Insufficient documentation

## 2024-02-16 DIAGNOSIS — Z79899 Other long term (current) drug therapy: Secondary | ICD-10-CM | POA: Insufficient documentation

## 2024-02-16 DIAGNOSIS — I130 Hypertensive heart and chronic kidney disease with heart failure and stage 1 through stage 4 chronic kidney disease, or unspecified chronic kidney disease: Secondary | ICD-10-CM | POA: Insufficient documentation

## 2024-02-16 DIAGNOSIS — R079 Chest pain, unspecified: Secondary | ICD-10-CM | POA: Diagnosis present

## 2024-02-16 DIAGNOSIS — Z21 Asymptomatic human immunodeficiency virus [HIV] infection status: Secondary | ICD-10-CM | POA: Diagnosis not present

## 2024-02-16 DIAGNOSIS — Z85528 Personal history of other malignant neoplasm of kidney: Secondary | ICD-10-CM | POA: Insufficient documentation

## 2024-02-16 DIAGNOSIS — I5032 Chronic diastolic (congestive) heart failure: Secondary | ICD-10-CM | POA: Diagnosis not present

## 2024-02-16 DIAGNOSIS — Z85118 Personal history of other malignant neoplasm of bronchus and lung: Secondary | ICD-10-CM | POA: Insufficient documentation

## 2024-02-16 DIAGNOSIS — N1832 Chronic kidney disease, stage 3b: Secondary | ICD-10-CM | POA: Insufficient documentation

## 2024-02-16 LAB — RESP PANEL BY RT-PCR (RSV, FLU A&B, COVID)  RVPGX2
Influenza A by PCR: NEGATIVE
Influenza B by PCR: NEGATIVE
Resp Syncytial Virus by PCR: NEGATIVE
SARS Coronavirus 2 by RT PCR: NEGATIVE

## 2024-02-16 LAB — BASIC METABOLIC PANEL WITH GFR
Anion gap: 12 (ref 5–15)
BUN: 24 mg/dL — ABNORMAL HIGH (ref 8–23)
CO2: 20 mmol/L — ABNORMAL LOW (ref 22–32)
Calcium: 8.9 mg/dL (ref 8.9–10.3)
Chloride: 107 mmol/L (ref 98–111)
Creatinine, Ser: 2.22 mg/dL — ABNORMAL HIGH (ref 0.61–1.24)
GFR, Estimated: 32 mL/min — ABNORMAL LOW
Glucose, Bld: 75 mg/dL (ref 70–99)
Potassium: 4.3 mmol/L (ref 3.5–5.1)
Sodium: 140 mmol/L (ref 135–145)

## 2024-02-16 LAB — CBC
HCT: 31.7 % — ABNORMAL LOW (ref 39.0–52.0)
Hemoglobin: 11.2 g/dL — ABNORMAL LOW (ref 13.0–17.0)
MCH: 32.7 pg (ref 26.0–34.0)
MCHC: 35.3 g/dL (ref 30.0–36.0)
MCV: 92.7 fL (ref 80.0–100.0)
Platelets: 273 K/uL (ref 150–400)
RBC: 3.42 MIL/uL — ABNORMAL LOW (ref 4.22–5.81)
RDW: 13.4 % (ref 11.5–15.5)
WBC: 7.1 K/uL (ref 4.0–10.5)
nRBC: 0 % (ref 0.0–0.2)

## 2024-02-16 LAB — TROPONIN T, HIGH SENSITIVITY
Troponin T High Sensitivity: 33 ng/L — ABNORMAL HIGH (ref 0–19)
Troponin T High Sensitivity: 31 ng/L — ABNORMAL HIGH (ref 0–19)

## 2024-02-16 MED ORDER — HYDRALAZINE HCL 20 MG/ML IJ SOLN
5.0000 mg | Freq: Once | INTRAMUSCULAR | Status: AC
  Administered 2024-02-16: 5 mg via INTRAVENOUS
  Filled 2024-02-16: qty 1

## 2024-02-16 MED ORDER — ONDANSETRON HCL 4 MG/2ML IJ SOLN
4.0000 mg | Freq: Once | INTRAMUSCULAR | Status: AC
  Administered 2024-02-16: 4 mg via INTRAVENOUS
  Filled 2024-02-16: qty 2

## 2024-02-16 MED ORDER — PREDNISONE 10 MG PO TABS: 20.0000 mg | ORAL_TABLET | Freq: Every day | ORAL | 0 refills | Status: AC

## 2024-02-16 MED ORDER — IPRATROPIUM-ALBUTEROL 0.5-2.5 (3) MG/3ML IN SOLN
3.0000 mL | RESPIRATORY_TRACT | Status: AC
  Administered 2024-02-16 (×3): 3 mL via RESPIRATORY_TRACT
  Filled 2024-02-16 (×3): qty 3

## 2024-02-16 MED ORDER — AMLODIPINE BESYLATE 5 MG PO TABS
10.0000 mg | ORAL_TABLET | Freq: Once | ORAL | Status: AC
  Administered 2024-02-16: 10 mg via ORAL
  Filled 2024-02-16: qty 2

## 2024-02-16 MED ORDER — IOHEXOL 350 MG/ML SOLN
50.0000 mL | Freq: Once | INTRAVENOUS | Status: AC | PRN
  Administered 2024-02-16: 50 mL via INTRAVENOUS

## 2024-02-16 MED ORDER — IPRATROPIUM-ALBUTEROL 0.5-2.5 (3) MG/3ML IN SOLN
RESPIRATORY_TRACT | Status: AC
  Filled 2024-02-16: qty 3

## 2024-02-16 MED ORDER — METHYLPREDNISOLONE SODIUM SUCC 125 MG IJ SOLR
125.0000 mg | Freq: Once | INTRAMUSCULAR | Status: AC
  Administered 2024-02-16: 125 mg via INTRAVENOUS
  Filled 2024-02-16: qty 2

## 2024-02-16 MED ORDER — HYDROMORPHONE HCL 1 MG/ML IJ SOLN
0.5000 mg | Freq: Once | INTRAMUSCULAR | Status: AC
  Administered 2024-02-16: 0.5 mg via INTRAVENOUS
  Filled 2024-02-16: qty 1

## 2024-02-16 MED ORDER — IPRATROPIUM-ALBUTEROL 0.5-2.5 (3) MG/3ML IN SOLN
3.0000 mL | Freq: Once | RESPIRATORY_TRACT | Status: AC
  Administered 2024-02-16: 3 mL via RESPIRATORY_TRACT

## 2024-02-16 MED ORDER — AZITHROMYCIN 250 MG PO TABS: 250.0000 mg | ORAL_TABLET | Freq: Every day | ORAL | 0 refills | Status: DC

## 2024-02-16 NOTE — ED Provider Notes (Signed)
 " [place] [facility] AT [facility] Provider Note   CSN: [id number] Arrival date & time: 02/16/24  9378     Patient presents with: Chest Pain   Kevin [name] is a 69 y.o. male.   HPI      69yo male with history of chronic diastolic CHF, CKD, COPD, hypertension, papillary renal cell carcinoma, AIDs, non-small cell lung cancer, who presents with concern for chest pain.   Feels like something stuck in middle and into back Started 1AM Feels short of breath, thinks because of pain No nausea or vomiting No fever When woke up it hurt to sit up Has not had bp meds yet today  Past Medical History:  Diagnosis Date   AIDS (acquired immune deficiency syndrome) (HCC) 08/17/2016   Amaurosis fugax 08/18/2022   Chronic diastolic CHF (congestive heart failure), NYHA class 3 (HCC) 01/2016   Chronic lower back pain    CKD (chronic kidney disease), stage III (HCC)    stage 3b   COPD (chronic obstructive pulmonary disease) (HCC)    Dyspnea    Gout    forearms, hands, ankles, feet (06/05/2016)   Headache    weekly (06/05/2016)   Heart murmur    never has caused any problems per pt   History of herpes zoster virus 01/2023   Hypertension    Hypertensive crisis 08/15/2017   Lipoma 07/06/2022   Neuromuscular disorder (HCC) 02/05/2023   post herpetic neuralgia    -  Primary   OSA on CPAP    does not use CPAP   PAD (peripheral artery disease)    PAF (paroxysmal atrial fibrillation) (HCC) 01/2016   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   Pulmonary nodules 02/2023   Substance abuse (HCC)    in past    Past Surgical History:  Procedure Laterality Date   BRONCHIAL BIOPSY  06/11/2023   Procedure: BRONCHOSCOPY, WITH BIOPSY;  Surgeon: Shelah Lamar RAMAN, MD;  Location: [facility] ENDOSCOPY;  Service: Pulmonary;;   BRONCHIAL BRUSHINGS  06/11/2023   Procedure: BRONCHOSCOPY, WITH BRUSH BIOPSY;  Surgeon: Shelah Lamar RAMAN, MD;  Location: [facility] ENDOSCOPY;  Service: Pulmonary;;    BRONCHIAL NEEDLE ASPIRATION BIOPSY  06/11/2023   Procedure: BRONCHOSCOPY, WITH NEEDLE ASPIRATION BIOPSY;  Surgeon: Shelah Lamar RAMAN, MD;  Location: [facility] ENDOSCOPY;  Service: Pulmonary;;   BRONCHIAL WASHINGS  06/11/2023   Procedure: IRRIGATION, BRONCHUS;  Surgeon: Shelah Lamar RAMAN, MD;  Location: [facility] ENDOSCOPY;  Service: Pulmonary;;   COLONOSCOPY N/A 04/24/2023   Procedure: COLONOSCOPY;  Surgeon: Rosalie Kitchens, MD;  Location: [facility] ENDOSCOPY;  Service: Gastroenterology;  Laterality: N/A;   IR RADIOLOGIST EVAL & MGMT  05/31/2021   IR RADIOLOGIST EVAL & MGMT  07/26/2021   LEFT HEART CATH AND CORONARY ANGIOGRAPHY N/A 10/23/2016   Procedure: LEFT HEART CATH AND CORONARY ANGIOGRAPHY;  Surgeon: Anner Alm ORN, MD;  Location: [facility] INVASIVE CV LAB;  Service: Cardiovascular;  Laterality: N/A;   LOWER EXTREMITY ANGIOGRAPHY N/A 07/17/2016   Procedure: Lower Extremity Angiography;  Surgeon: Court Dorn PARAS, MD;  Location[facility] INVASIVE CV LAB;  Service: Cardiovascular;  Laterality: N/A;   LOWER EXTREMITY INTERVENTION N/A 06/05/2016   Procedure: Lower Extremity Intervention;  Surgeon: Dorn PARAS Court, MD;  Location: [facility] INVASIVE CV LAB;  Service: Cardiovascular;  Laterality: N/A;   PERIPHERAL VASCULAR ATHERECTOMY Right 07/17/2016   Procedure: Peripheral Vascular Atherectomy;  Surgeon: Court Dorn PARAS, MD;  Location: [facility] INVASIVE CV LAB;  Service: Cardiovascular;  Laterality: Right;  SFA   PERIPHERAL VASCULAR INTERVENTION  06/05/2016   Procedure: Peripheral Vascular Intervention;  Surgeon: Dorn JINNY Lesches, MD;  Location: [facility] INVASIVE CV LAB;  Service: Cardiovascular;;  left SFA   POLYPECTOMY  04/24/2023   Procedure: POLYPECTOMY;  Surgeon: Rosalie Kitchens, MD;  Location: [facility];  Service: Gastroenterology;;   RADIOLOGY WITH ANESTHESIA Right 06/29/2021   Procedure: RIGHT RENAL CRYOABLATION;  Surgeon: Philip Cornet, MD;  Location: [facility] ORS;  Service: Anesthesiology;  Laterality: Right;   VIDEO BRONCHOSCOPY WITH ENDOBRONCHIAL  NAVIGATION Right 06/11/2023   Procedure: VIDEO BRONCHOSCOPY WITH ENDOBRONCHIAL NAVIGATION;  Surgeon: Shelah Lamar RAMAN, MD;  Location: [facility] ENDOSCOPY;  Service: Pulmonary;  Laterality: Right;  WITH FLUORO AND BIOPSY    Prior to Admission medications  Medication Sig Start Date End Date Taking? Authorizing Provider  azithromycin  (ZITHROMAX ) 250 MG tablet Take 1 tablet (250 mg total) by mouth daily. Take first 2 tablets together, then 1 every day until finished. 02/16/24  Yes Dreama Longs, MD  predniSONE  (DELTASONE ) 10 MG tablet Take 2 tablets (20 mg total) by mouth daily for 4 days. 02/16/24 02/20/24 Yes Dreama Longs, MD  albuterol  (VENTOLIN  HFA) 108 (90 Base) MCG/ACT inhaler Inhale 1-2 puffs into the lungs every 6 (six) hours as needed for wheezing or shortness of breath. 02/12/24   Neysa Reggy BIRCH, MD  amitriptyline  (ELAVIL ) 10 MG tablet Take 1 tablet (10 mg total) by mouth at bedtime. Patient not taking: Reported on 02/12/2024 11/26/23 02/11/24  [name], MD  amLODipine -valsartan  (EXFORGE ) 10-320 MG tablet Take 1 tablet by mouth daily. 01/25/24   Goodrich, Callie E, PA-C  aspirin  EC (ASPIRIN  LOW DOSE) 81 MG tablet Take 1 tablet (81 mg total) by mouth daily, swallow whole 05/07/23   [name], MD  atorvastatin  (LIPITOR) 80 MG tablet Take 1 tablet (80 mg total) by mouth daily. 07/18/23   Walker, Caitlin S, NP  baclofen (LIORESAL) 10 MG tablet Take 10 mg by mouth 3 (three) times daily.    [provider]  celecoxib (CELEBREX) 200 MG capsule Take 200 mg by mouth 2 (two) times daily as needed (for pain).    [provider]  ciclopirox  (PENLAC ) 8 % solution Apply topically at bedtime. Apply over nail and surrounding skin. Apply daily over previous coat. After seven (7) days, may remove with alcohol and continue cycle. 07/03/23   Gershon Donnice SAUNDERS, DPM  dolutegravir -lamiVUDine  (DOVATO ) 50-300 MG tablet Take 1 tablet by mouth daily. 01/31/24   Manandhar, Sabina, MD   Fluticasone -Umeclidin-Vilant (TRELEGY ELLIPTA ) 200-62.5-25 MCG/ACT AEPB Inhale 1 puff into the lungs daily. 09/30/23   Jadine Toribio SQUIBB, MD  hydrALAZINE  (APRESOLINE ) 25 MG tablet Take 1 tablet (25 mg total) by mouth 2 (two) times daily. 06/29/23 02/12/24  [name], NP  hydrOXYzine  (ATARAX ) 25 MG tablet Take 25 mg by mouth 4 (four) times daily. 10/12/23   [provider]  ipratropium-albuterol  (DUONEB) 0.5-2.5 (3) MG/3ML SOLN Take 3 mLs by nebulization every 6 (six) hours as needed. Patient taking differently: Take 3 mLs by nebulization every 6 (six) hours as needed (for shortness of breath or wheezing). 02/27/23   Neysa Reggy BIRCH, MD  montelukast  (SINGULAIR ) 10 MG tablet Take 1 tablet (10 mg total) by mouth at bedtime. 01/25/24   Cobb, Comer GAILS, NP  nebivolol  (BYSTOLIC ) 5 MG tablet Take 1 tablet (5 mg total) by mouth daily. 07/18/23   [name], NP  oxyCODONE -acetaminophen  (PERCOCET) 10-325 MG tablet Take 1 tablet by mouth 3 (three) times daily. 10/08/23   [provider]  pregabalin  (LYRICA ) 50 MG capsule Take 1 capsule (50 mg total) by mouth 2 (two) times daily. 10/19/23 04/16/24  [name], MD  temazepam  (RESTORIL ) 15 MG capsule Take 1-2 capsules (15-30 mg total) by mouth at bedtime as needed for sleep. 02/12/24   Neysa Rama D, MD  Vitamin D , Ergocalciferol , (DRISDOL ) 1.25 MG (50000 UNIT) CAPS capsule Take 1 capsule (50,000 Units total) by mouth once a week for 16 weeks. Then recheck level in the office. 10/24/23       Allergies: Isosorbide  mononitrate [isosorbide  nitrate]    Review of Systems  Updated Vital Signs BP (!) 173/85   Pulse 73   Temp 98.1 F (36.7 C) (Oral)   Resp 17   Ht 6' (1.829 m)   Wt 70.3 kg   SpO2 94%   BMI 21.02 kg/m   Physical Exam Vitals and nursing note reviewed.  Constitutional:      General: He is not in acute distress.    Appearance: He is well-developed. He is not diaphoretic.  HENT:     Head: Normocephalic  and atraumatic.  Eyes:     Conjunctiva/sclera: Conjunctivae normal.  Cardiovascular:     Rate and Rhythm: Normal rate and regular rhythm.     Heart sounds: Normal heart sounds. No murmur heard.    No friction rub. No gallop.  Pulmonary:     Effort: Pulmonary effort is normal. No respiratory distress.     Breath sounds: Wheezing present. No rales.  Abdominal:     General: There is no distension.     Palpations: Abdomen is soft.     Tenderness: There is no abdominal tenderness. There is no guarding.  Musculoskeletal:     Cervical back: Normal range of motion.  Skin:    General: Skin is warm and dry.  Neurological:     Mental Status: He is alert and oriented to person, place, and time.     (all labs ordered are listed, but only abnormal results are displayed) Labs Reviewed  BASIC METABOLIC PANEL WITH GFR - Abnormal; Notable for the following components:      Result Value   CO2 20 (*)    BUN 24 (*)    Creatinine, Ser 2.22 (*)    GFR, Estimated 32 (*)    All other components within normal limits  CBC - Abnormal; Notable for the following components:   RBC 3.42 (*)    Hemoglobin 11.2 (*)    HCT 31.7 (*)    All other components within normal limits  TROPONIN T, HIGH SENSITIVITY - Abnormal; Notable for the following components:   Troponin T High Sensitivity 33 (*)    All other components within normal limits  TROPONIN T, HIGH SENSITIVITY - Abnormal; Notable for the following components:   Troponin T High Sensitivity 31 (*)    All other components within normal limits  RESP PANEL BY RT-PCR (RSV, FLU A&B, COVID)  [id number]    EKG: EKG Interpretation Date/Time:  Saturday February 16 2024 06:39:53 EST Ventricular Rate:  62 PR Interval:  208 QRS Duration:  90 QT Interval:  438 QTC Calculation: 444 R Axis:   88  Text Interpretation: Normal sinus rhythm Moderate voltage criteria for LVH, may be normal variant ( Sokolow-Lyon , Cornell product ) Septal infarct (cited on or before  16-Feb-2024) ST & Marked T wave abnormality, consider inferior ischemia Abnormal ECG When compared with ECG of 30-Sep-2023 03:07, Vent. rate has decreased BY  30 BPM Serial changes of  evolving Septal infarct Present ST changes noted more prominent Confirmed by Bari Pfeiffer ([id number]) on 02/16/2024 6:42:30 AM  Radiology: CT Angio Chest PE W and/or Wo Contrast Result Date: 02/16/2024 EXAM: CTA CHEST 02/16/2024 08:30:01 AM TECHNIQUE: CTA of the chest was performed after the administration of intravenous contrast. Multiplanar reformatted images are provided for review. MIP images are provided for review. Automated exposure control, iterative reconstruction, and/or weight based adjustment of the mA/kV was utilized to reduce the radiation dose to as low as reasonably achievable. COMPARISON: CT chest 02/04/2024. Clinical history: Non-small cell lung cancer with suspected pulmonary embolism, high probability. CLINICAL HISTORY: Pulmonary embolism (PE) suspected, high prob. FINDINGS: PULMONARY ARTERIES: Pulmonary arteries are adequately opacified for evaluation. No acute pulmonary embolus. The main pulmonary artery measures 3 cm. MEDIASTINUM: The heart and pericardium demonstrate no acute abnormality. No pericardial effusion. Aortic atherosclerosis. LYMPH NODES: No mediastinal, hilar or axillary lymphadenopathy. LUNGS AND PLEURA: There is some layering debris or secretions noted within the right mainstem bronchus. Increased bronchial wall thickening is noted bilaterally compared to the previous exam. Focal area of scarring within the posterior right lower lobe as noted previously. Associated nodule in this area measures 7 mm, unchanged from previous exam. Small a ground-glass nodule within the right middle lobe is unchanged, measuring 5 mm, image 83/6. Stable right upper lobe lung nodule measuring 3 mm. No pleural fluid, edema, pneumothorax, or consolidative changes. UPPER ABDOMEN: No acute findings within the imaged  portions of the upper abdomen. SOFT TISSUES AND BONES: No acute bone or soft tissue abnormality. IMPRESSION: 1. No evidence of pulmonary embolism. 2. Stable small lung nodules as reported on recent staging exam from 02/04/2024 3. Increased caliber of the main pulmonary artery, which maybe seen in the setting of pulmonary arterial hypertension. 4. Increased bronchial wall thickening noted bilaterally, which may reflect superimposed bronchitis. Electronically signed by: Waddell Calk MD 02/16/2024 08:45 AM EST RP Workstation: [id number]   DG Chest 2 View Result Date: 02/16/2024 EXAM: 2 VIEW(S) XRAY OF THE CHEST 02/16/2024 06:50:08 AM COMPARISON: 09/29/2023 CLINICAL HISTORY: cp FINDINGS: LUNGS AND PLEURA: No focal pulmonary opacity. No pleural effusion. No pneumothorax. HEART AND MEDIASTINUM: No acute abnormality of the cardiac and mediastinal silhouettes. BONES AND SOFT TISSUES: No acute osseous abnormality. IMPRESSION: 1. No acute cardiopulmonary process. Electronically signed by: Evalene Coho MD 02/16/2024 07:10 AM EST RP Workstation: [id number]     Procedures   Medications Ordered in the ED  ipratropium-albuterol  (DUONEB) 0.5-2.5 (3) MG/3ML nebulizer solution (  Canceled Entry 02/16/24 1004)  amLODipine  (NORVASC ) tablet 10 mg (10 mg Oral Given 02/16/24 0751)  HYDROmorphone  (DILAUDID ) injection 0.5 mg (0.5 mg Intravenous Given 02/16/24 0750)  ondansetron  (ZOFRAN ) injection 4 mg (4 mg Intravenous Given 02/16/24 0751)  iohexol  (OMNIPAQUE ) 350 MG/ML injection 50 mL (50 mLs Intravenous Contrast Given 02/16/24 0823)  ipratropium-albuterol  (DUONEB) 0.5-2.5 (3) MG/3ML nebulizer solution 3 mL (3 mLs Nebulization Given 02/16/24 0953)  methylPREDNISolone  sodium succinate (SOLU-MEDROL ) 125 mg/2 mL injection 125 mg (125 mg Intravenous Given 02/16/24 1017)  ipratropium-albuterol  (DUONEB) 0.5-2.5 (3) MG/3ML nebulizer solution 3 mL (3 mLs Nebulization Given 02/16/24 1116)  hydrALAZINE  (APRESOLINE ) injection 5 mg (5 mg  Intravenous Given 02/16/24 1017)                                     68yo male with history of chronic diastolic CHF, CKD, COPD, hypertension, papillary renal cell carcinoma, AIDs, non-small cell lung cancer,  who presents with concern for chest pain.   EKG with significant ST changes, however similar to prior.   Chest x-ray was completed and evaluated by me shows no pneumonia, no pulmonary edema or pneumothorax.  Labs completed and personally evaluated interpreted by me show a stable creatinine of 2.2, stable anemia of 11.2, stable troponin 33 compared to 32 previously.  CT PE study completed in the setting of history of cancer, dyspnea, pleuritic pain shows no evidence of PE, stable small lung nodules, findings of pulmonary arterial hypertension, increased bronchial wall thickening which may reflect bronchitis.  Normal bilateral upper and lower extremity pulses, presence of dyspnea, lower suspicion for aortic dissection.  Delta troponin is stable at 31.  Low suspicion for ACS.  On reevaluation, his shortness of breath is worsened, he has diffuse wheezing on exam consistent with COPD exacerbation.  Given Solu-Medrol , DuoNebs.  He is feeling improved on reevaluation, speaking in full sentences, no respiratory distress.  Feel stable for outpatient follow-up of his COPD symptoms.  Given prescription for prednisone  and azithromycin  for COPD exacerbation.  Given his chest pain and mildly elevated troponins, history of CAD, recommend follow-up with cardiology.  Recommend follow-up with pulmonology regarding his COPD, pulmonary hypertension and pulmonary nodules.     Final diagnoses:  COPD exacerbation (HCC)  Chest pain, unspecified type    ED Discharge Orders          Ordered    predniSONE  (DELTASONE ) 10 MG tablet  Daily        02/16/24 1205    azithromycin  (ZITHROMAX ) 250 MG tablet  Daily        02/16/24 1205    Ambulatory referral to Cardiology        02/16/24 1206                Dreama Longs, MD 02/16/24 1226  "

## 2024-02-16 NOTE — ED Notes (Signed)
" °   02/16/24 1033  Oxygen Therapy/Pulse Ox  O2 Device Nasal Cannula  SpO2 98 %  O2 Therapy Oxygen  O2 Flow Rate (L/min) (S)  2 L/min (Applied 02 @ 2 L [place] due to desaturation to 90% post and between nebulizer treatments.)  FiO2 (%) 28 %    "

## 2024-02-16 NOTE — ED Triage Notes (Signed)
 Pt reports onset of central chest pain, around 1 am this morning it feels like something is stuck. SOB when getting up and moving around. Hx of COPD

## 2024-02-16 NOTE — ED Notes (Signed)
 Patient transported to X-ray

## 2024-02-18 NOTE — Progress Notes (Unsigned)
 [facility] [place]  ASSESSMENT / PLAN: Kevin [name] is a 69 y.o. 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  Recommend:  Abstinence from all tobacco products. Blood glucose control with goal A1c < 7%. Blood pressure control with goal blood pressure < 130/80 mmHg. Lipid reduction therapy with goal LDL-C < 55 mg/dL. Aspirin  81mg  by mouth daily. Atorvastatin  40-80mg  PO QD (or other high intensity statin therapy). Daily walking to and past the point of discomfort.   Recommend trial of medical therapy for intermittent claudication.  Follow-up with me in 3 months with ABI and arterial duplex of bilateral lower extremities for consideration of angiogram  CHIEF COMPLAINT: Peripheral arterial disease  HISTORY OF PRESENT ILLNESS: Kevin [name] is a 69 y.o. male referred to clinic for evaluation of bilateral lower extremity peripheral arterial disease.  Patient has a longstanding history of peripheral neuropathy which is quite bothersome to him.  He takes Lyrica  for this.  He has pronounced calluses on his bilateral feet which are neuropathic in appearance.  He describes typical claudication symptoms after about 2 blocks.  He has cramping discomfort in his thighs and then his calves.  As he rests this will resolve.  He does not have rest pain symptoms.  He has no ulcers about his feet.  Past Medical History:  Diagnosis Date   AIDS (acquired immune deficiency syndrome) (HCC) 08/17/2016   Amaurosis fugax 08/18/2022   Chronic diastolic CHF (congestive heart failure), NYHA class 3 (HCC) 01/2016   Chronic lower back pain    CKD (chronic kidney disease), stage III (HCC)    stage 3b   COPD (chronic obstructive pulmonary disease) (HCC)    Dyspnea    Gout    forearms, hands, ankles, feet (06/05/2016)   Headache    weekly (06/05/2016)   Heart murmur    never has caused any problems per pt   History of  herpes zoster virus 01/2023   Hypertension    Hypertensive crisis 08/15/2017   Lipoma 07/06/2022   Neuromuscular disorder (HCC) 02/05/2023   post herpetic neuralgia    -  Primary   OSA on CPAP    does not use CPAP   PAD (peripheral artery disease)    PAF (paroxysmal atrial fibrillation) (HCC) 01/2016   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   Pulmonary nodules 02/2023   Substance abuse (HCC)    in past    Past Surgical History:  Procedure Laterality Date   BRONCHIAL BIOPSY  06/11/2023   Procedure: BRONCHOSCOPY, WITH BIOPSY;  Surgeon: Shelah Lamar RAMAN, MD;  Location: [facility] ENDOSCOPY;  Service: Pulmonary;;   BRONCHIAL BRUSHINGS  06/11/2023   Procedure: BRONCHOSCOPY, WITH BRUSH BIOPSY;  Surgeon: Shelah Lamar RAMAN, MD;  Location: [facility] ENDOSCOPY;  Service: Pulmonary;;   BRONCHIAL NEEDLE ASPIRATION BIOPSY  06/11/2023   Procedure: BRONCHOSCOPY, WITH NEEDLE ASPIRATION BIOPSY;  Surgeon: Shelah Lamar RAMAN, MD;  Location: [facility] ENDOSCOPY;  Service: Pulmonary;;   BRONCHIAL WASHINGS  06/11/2023   Procedure: IRRIGATION, BRONCHUS;  Surgeon: Shelah Lamar RAMAN, MD;  Location: [facility] ENDOSCOPY;  Service: Pulmonary;;   COLONOSCOPY N/A 04/24/2023   Procedure: COLONOSCOPY;  Surgeon: Rosalie Kitchens, MD;  Location: [facility] ENDOSCOPY;  Service: Gastroenterology;  Laterality: N/A;   IR RADIOLOGIST EVAL & MGMT  05/31/2021   IR RADIOLOGIST EVAL & MGMT  07/26/2021   LEFT HEART CATH AND CORONARY ANGIOGRAPHY N/A 10/23/2016   Procedure: LEFT HEART CATH AND CORONARY ANGIOGRAPHY;  Surgeon: Anner Alm ORN,  MD;  Location: [facility] INVASIVE CV LAB;  Service: Cardiovascular;  Laterality: N/A;   LOWER EXTREMITY ANGIOGRAPHY N/A 07/17/2016   Procedure: Lower Extremity Angiography;  Surgeon: Court Dorn PARAS, MD;  Location: [facility] INVASIVE CV LAB;  Service: Cardiovascular;  Laterality: N/A;   LOWER EXTREMITY INTERVENTION N/A 06/05/2016   Procedure: Lower Extremity Intervention;  Surgeon: Dorn PARAS Court, MD;  Location: [facility] INVASIVE CV LAB;  Service:  Cardiovascular;  Laterality: N/A;   PERIPHERAL VASCULAR ATHERECTOMY Right 07/17/2016   Procedure: Peripheral Vascular Atherectomy;  Surgeon: Court Dorn PARAS, MD;  Location: [facility] INVASIVE CV LAB;  Service: Cardiovascular;  Laterality: Right;  SFA   PERIPHERAL VASCULAR INTERVENTION  06/05/2016   Procedure: Peripheral Vascular Intervention;  Surgeon: Dorn PARAS Court, MD;  Location: [facility] INVASIVE CV LAB;  Service: Cardiovascular;;  left SFA   POLYPECTOMY  04/24/2023   Procedure: POLYPECTOMY;  Surgeon: Rosalie Kitchens, MD;  Location: [facility] ENDOSCOPY;  Service: Gastroenterology;;   RADIOLOGY WITH ANESTHESIA Right 06/29/2021   Procedure: RIGHT RENAL CRYOABLATION;  Surgeon: Philip Cornet, MD;  Location: [facility] ORS;  Service: Anesthesiology;  Laterality: Right;   VIDEO BRONCHOSCOPY WITH ENDOBRONCHIAL NAVIGATION Right 06/11/2023   Procedure: VIDEO BRONCHOSCOPY WITH ENDOBRONCHIAL NAVIGATION;  Surgeon: Shelah Lamar RAMAN, MD;  Location: [facility] ENDOSCOPY;  Service: Pulmonary;  Laterality: Right;  WITH FLUORO AND BIOPSY    Family History  Problem Relation Age of Onset   High blood pressure Mother    Lupus Mother    Seizures Daughter    Heart attack Daughter     Social History   Socioeconomic History   Marital status: Significant Other    Spouse name: Not on file   Number of children: Not on file   Years of education: Not on file   Highest education level: Not on file  Occupational History   Not on file  Tobacco Use   Smoking status: Some Days    Current packs/day: 0.10    Average packs/day: 0.1 packs/day for 42.0 years (4.2 ttl pk-yrs)    Types: Cigarettes    Passive exposure: Never   Smokeless tobacco: Never   Tobacco comments:    Has not smoked since May 4th.2025-06/20/2023        Pt smoke 4-5 cigarettes per day  Vaping Use   Vaping status: Former   Substances: Nicotine , Flavoring  Substance and Sexual Activity   Alcohol use: Not Currently    Alcohol/week: 2.0 standard drinks of alcohol    Types: 2 Cans of  beer per week    Comment: rare   Drug use: Not Currently    Comment: rare   Sexual activity: Not Currently    Comment: declined condoms  Other Topics Concern   Not on file  Social History Narrative   Right handed   Social Drivers of Health   Tobacco Use: High Risk (02/16/2024)   Patient History    Smoking Tobacco Use: Some Days    Smokeless Tobacco Use: Never    Passive Exposure: Never  Financial Resource Strain: Low Risk (01/21/2024)   Overall Financial Resource Strain (CARDIA)    Difficulty of Paying Living Expenses: Not hard at all  Food Insecurity: No Food Insecurity (01/21/2024)   Epic    Worried About Radiation Protection Practitioner of Food in the Last Year: Never true    Ran Out of Food in the Last Year: Never true  Transportation Needs: No Transportation Needs (01/21/2024)   Epic    Lack of Transportation (Medical): No    Lack of  Transportation (Non-Medical): No  Physical Activity: Inactive (01/21/2024)   Exercise Vital Sign    Days of Exercise per Week: 0 days    Minutes of Exercise per Session: 0 min  Stress: No Stress Concern Present (01/21/2024)   [employer] of Occupational Health - Occupational Stress Questionnaire    Feeling of Stress: Not at all  Social Connections: Socially Isolated (01/21/2024)   Social Connection and Isolation Panel    Frequency of Communication with Friends and Family: More than three times a week    Frequency of Social Gatherings with Friends and Family: Three times a week    Attends Religious Services: Never    Active Member of Clubs or Organizations: No    Attends Banker Meetings: Never    Marital Status: Separated  Intimate Partner Violence: Not At Risk (01/21/2024)   Epic    Fear of Current or Ex-Partner: No    Emotionally Abused: No    Physically Abused: No    Sexually Abused: No  Depression (PHQ2-9): Low Risk (01/21/2024)   Depression (PHQ2-9)    PHQ-2 Score: 3  Alcohol Screen: Low Risk (01/21/2024)   Alcohol Screen    Last  Alcohol Screening Score (AUDIT): 0  Housing: Unknown (01/21/2024)   Epic    Unable to Pay for Housing in the Last Year: No    Number of Times Moved in the Last Year: Not on file    Homeless in the Last Year: No  Utilities: Not At Risk (01/21/2024)   Epic    Threatened with loss of utilities: No  Health Literacy: Adequate Health Literacy (01/21/2024)   B1300 Health Literacy    Frequency of need for help with medical instructions: Never    Allergies  Allergen Reactions   Isosorbide  Mononitrate [Isosorbide  Nitrate] Shortness Of Breath    Current Outpatient Medications  Medication Sig Dispense Refill   albuterol  (VENTOLIN  HFA) 108 (90 Base) MCG/ACT inhaler Inhale 1-2 puffs into the lungs every 6 (six) hours as needed for wheezing or shortness of breath. 6.7 g 12   amitriptyline  (ELAVIL ) 10 MG tablet Take 1 tablet (10 mg total) by mouth at bedtime. (Patient not taking: Reported on 02/12/2024) 30 tablet 0   amLODipine -valsartan  (EXFORGE ) 10-320 MG tablet Take 1 tablet by mouth daily. 90 tablet 2   aspirin  EC (ASPIRIN  LOW DOSE) 81 MG tablet Take 1 tablet (81 mg total) by mouth daily, swallow whole 30 tablet 12   atorvastatin  (LIPITOR) 80 MG tablet Take 1 tablet (80 mg total) by mouth daily. 90 tablet 3   azithromycin  (ZITHROMAX ) 250 MG tablet Take 1 tablet (250 mg total) by mouth daily. Take first 2 tablets together, then 1 every day until finished. 6 tablet 0   baclofen (LIORESAL) 10 MG tablet Take 10 mg by mouth 3 (three) times daily.     celecoxib (CELEBREX) 200 MG capsule Take 200 mg by mouth 2 (two) times daily as needed (for pain).     ciclopirox  (PENLAC ) 8 % solution Apply topically at bedtime. Apply over nail and surrounding skin. Apply daily over previous coat. After seven (7) days, may remove with alcohol and continue cycle. 6.6 mL 2   dolutegravir -lamiVUDine  (DOVATO ) 50-300 MG tablet Take 1 tablet by mouth daily. 30 tablet 5   Fluticasone -Umeclidin-Vilant (TRELEGY ELLIPTA ) 200-62.5-25  MCG/ACT AEPB Inhale 1 puff into the lungs daily. 60 each 2   hydrALAZINE  (APRESOLINE ) 25 MG tablet Take 1 tablet (25 mg total) by mouth 2 (two) times daily. 180 tablet  3   hydrOXYzine  (ATARAX ) 25 MG tablet Take 25 mg by mouth 4 (four) times daily.     ipratropium-albuterol  (DUONEB) 0.5-2.5 (3) MG/3ML SOLN Take 3 mLs by nebulization every 6 (six) hours as needed. (Patient taking differently: Take 3 mLs by nebulization every 6 (six) hours as needed (for shortness of breath or wheezing).) 360 mL 12   montelukast  (SINGULAIR ) 10 MG tablet Take 1 tablet (10 mg total) by mouth at bedtime. 30 tablet 5   nebivolol  (BYSTOLIC ) 5 MG tablet Take 1 tablet (5 mg total) by mouth daily. 90 tablet 3   oxyCODONE -acetaminophen  (PERCOCET) 10-325 MG tablet Take 1 tablet by mouth 3 (three) times daily.     predniSONE  (DELTASONE ) 10 MG tablet Take 2 tablets (20 mg total) by mouth daily for 4 days. 8 tablet 0   pregabalin  (LYRICA ) 50 MG capsule Take 1 capsule (50 mg total) by mouth 2 (two) times daily. 60 capsule 0   temazepam  (RESTORIL ) 15 MG capsule Take 1-2 capsules (15-30 mg total) by mouth at bedtime as needed for sleep. 60 capsule 5   Vitamin D , Ergocalciferol , (DRISDOL ) 1.25 MG (50000 UNIT) CAPS capsule Take 1 capsule (50,000 Units total) by mouth once a week for 16 weeks. Then recheck level in the office. 12 capsule 0   No current facility-administered medications for this visit.    PHYSICAL EXAM There were no vitals filed for this visit.  No acute distress Regular rate and rhythm Unlabored breathing No palpable pedal pulses   PERTINENT LABORATORY AND RADIOLOGIC DATA  Most recent CBC    Latest Ref Rng & Units 02/16/2024    6:37 AM 02/04/2024    8:37 AM 09/29/2023    9:20 PM  CBC  WBC 4.0 - 10.5 K/uL 7.1  8.7    Hemoglobin 13.0 - 17.0 g/dL 88.7  88.8  88.7   Hematocrit 39.0 - 52.0 % 31.7  31.4  33.0   Platelets 150 - 400 K/uL 273  283       Most recent CMP    Latest Ref Rng & Units 02/16/2024     6:37 AM 02/04/2024    8:37 AM 09/29/2023    9:20 PM  CMP  Glucose 70 - 99 mg/dL 75  891    BUN 8 - 23 mg/dL 24  22    Creatinine 9.38 - 1.24 mg/dL 7.77  7.63    Sodium 864 - 145 mmol/L 140  139  142   Potassium 3.5 - 5.1 mmol/L 4.3  4.2  4.7   Chloride 98 - 111 mmol/L 107  107    CO2 22 - 32 mmol/L 20  23    Calcium  8.9 - 10.3 mg/dL 8.9  8.6    Total Protein 6.5 - 8.1 g/dL  7.2    Total Bilirubin 0.0 - 1.2 mg/dL  0.6    Alkaline Phos 38 - 126 U/L  74    AST 15 - 41 U/L  21    ALT 0 - 44 U/L  11      Renal function Estimated Creatinine Clearance: 32.5 mL/min (A) (by C-G formula based on SCr of 2.22 mg/dL (H)).  Hemoglobin A1C (%)  Date Value  10/19/2023 5.5   Hgb A1c MFr Bld (% of total Hgb)  Date Value  04/28/2022 5.1    LDL Cholesterol (Calc)  Date Value Ref Range Status  07/06/2022 110 (H) mg/dL (calc) Final    Comment:    Reference range: <100 . Desirable range <  100 mg/dL for primary prevention;   <70 mg/dL for patients with CHD or diabetic patients  with > or = 2 CHD risk factors. SABRA LDL-C is now calculated using the Martin-Hopkins  calculation, which is a validated novel method providing  better accuracy than the Friedewald equation in the  estimation of LDL-C.  Gladis APPLETHWAITE et al. SANDREA. 7986;689(80): 2061-2068  (http://education.[facility].com/faq/FAQ164)    LDL Chol Calc (NIH)  Date Value Ref Range Status  10/24/2022 97 0 - 99 mg/dL Final     LOWER EXTREMITY DOPPLER STUDY   Patient Name:  [name]  Date of Exam:   10/08/2023  Medical Rec #: [id number]        Accession #:    [id number]  Date of Birth: 01-Jun-1955        Patient Gender: M  Patient Age:   59 years  Exam Location:  [address]  Procedure:      VAS US  ABI WITH/WO TBI  Referring Phys: DONNICE FEES    ---------------------------------------------------------------------------  -----    Indications: Claudication, and peripheral artery disease.   High Risk Factors:  Hypertension, hyperlipidemia, current smoker, coronary  artery                    disease.     Comparison Study: 09/05/22   Performing Technologist: Garnette Rockers     Examination Guidelines: A complete evaluation includes at minimum, Doppler  waveform signals and systolic blood pressure reading at the level of  bilateral  brachial, anterior tibial, and posterior tibial arteries, when vessel  segments  are accessible. Bilateral testing is considered an integral part of a  complete  examination. Photoelectric Plethysmograph (PPG) waveforms and toe systolic  pressure readings are included as required and additional duplex testing  as  needed. Limited examinations for reoccurring indications may be performed  as  noted.     ABI Findings:  +---------+------------------+-----+-------------------+--------+  Right   Rt Pressure (mmHg)IndexWaveform           Comment   +---------+------------------+-----+-------------------+--------+  Brachial 178                                                 +---------+------------------+-----+-------------------+--------+  PTA     133               0.74 monophasic                   +---------+------------------+-----+-------------------+--------+  DP      117               0.65 dampened monophasic          +---------+------------------+-----+-------------------+--------+  Great Toe65                0.36 Abnormal                     +---------+------------------+-----+-------------------+--------+   +---------+------------------+-----+----------+-------+  Left    Lt Pressure (mmHg)IndexWaveform  Comment  +---------+------------------+-----+----------+-------+  Brachial 180                                       +---------+------------------+-----+----------+-------+  PTA     126               0.70  monophasic         +---------+------------------+-----+----------+-------+  DP      124                0.69 monophasic         +---------+------------------+-----+----------+-------+  Great Toe69                0.38 Abnormal           +---------+------------------+-----+----------+-------+   +-------+-----------+-----------+------------+------------+  ABI/TBIToday's ABIToday's TBIPrevious ABIPrevious TBI  +-------+-----------+-----------+------------+------------+  Right 0.74       0.36       0.86        0.48          +-------+-----------+-----------+------------+------------+  Left  0.7        0.38       0.86        0.52          +-------+-----------+-----------+------------+------------+     TOES Findings:  +----------+---------------+--------+-------+  Right ToesPressure (mmHg)WaveformComment  +----------+---------------+--------+-------+  1st Digit                Abnormal         +----------+---------------+--------+-------+  2nd Digit                Abnormal         +----------+---------------+--------+-------+  3rd Digit                Abnormal         +----------+---------------+--------+-------+  4th Digit                Abnormal         +----------+---------------+--------+-------+  5th Digit                Abnormal         +----------+---------------+--------+-------+      +---------+---------------+--------+-------+  Left ToesPressure (mmHg)WaveformComment  +---------+---------------+--------+-------+  1st Digit               Abnormal         +---------+---------------+--------+-------+  2nd Digit               Abnormal         +---------+---------------+--------+-------+  3rd Digit               Abnormal         +---------+---------------+--------+-------+  4th Digit               Abnormal         +---------+---------------+--------+-------+  5th Digit               Abnormal         +---------+---------------+--------+-------+         Bilateral ABIs and TBIs appear  decreased compared to prior study on  09/05/22.    Summary:  Right: Resting right ankle-brachial index indicates moderate right lower  extremity arterial disease.   Left: Resting left ankle-brachial index indicates moderate left lower  extremity arterial disease.   Debby SAILOR. Magda, MD FACS [facility] Phone Number: [phone] 02/18/2024 11:14 AM   Total time spent on preparing this encounter including chart review, data review, collecting history, examining the patient, and coordinating care: 45 min  Portions of this report may have been transcribed using voice recognition software.  Every effort has been made to ensure accuracy; however, inadvertent computerized transcription errors may still be present.

## 2024-02-19 ENCOUNTER — Ambulatory Visit: Admitting: Vascular Surgery

## 2024-02-19 ENCOUNTER — Inpatient Hospital Stay (HOSPITAL_COMMUNITY): Admission: RE | Admit: 2024-02-19

## 2024-02-19 ENCOUNTER — Encounter: Payer: Self-pay | Admitting: Vascular Surgery

## 2024-02-19 ENCOUNTER — Ambulatory Visit (HOSPITAL_COMMUNITY)
Admission: RE | Admit: 2024-02-19 | Discharge: 2024-02-19 | Disposition: A | Discharge status: Home / Self Care | Source: Ambulatory Visit | Attending: Vascular Surgery | Admitting: Vascular Surgery

## 2024-02-19 VITALS — BP 207/105 | HR 60 | Temp 98.1°F | Ht 72.0 in | Wt 155.0 lb

## 2024-02-19 DIAGNOSIS — I70213 Atherosclerosis of native arteries of extremities with intermittent claudication, bilateral legs: Secondary | ICD-10-CM

## 2024-02-19 LAB — VAS US ABI WITH/WO TBI
Left ABI: 0.72
Right ABI: 0.81

## 2024-02-20 ENCOUNTER — Encounter: Payer: Self-pay | Admitting: Internal Medicine

## 2024-02-21 ENCOUNTER — Encounter (HOSPITAL_BASED_OUTPATIENT_CLINIC_OR_DEPARTMENT_OTHER): Payer: Self-pay | Admitting: Family

## 2024-02-21 ENCOUNTER — Ambulatory Visit (INDEPENDENT_AMBULATORY_CARE_PROVIDER_SITE_OTHER): Admitting: Family

## 2024-02-21 VITALS — BP 152/78 | HR 79 | Ht 72.0 in | Wt 157.1 lb

## 2024-02-21 DIAGNOSIS — I10 Essential (primary) hypertension: Secondary | ICD-10-CM | POA: Diagnosis not present

## 2024-02-21 DIAGNOSIS — I25118 Atherosclerotic heart disease of native coronary artery with other forms of angina pectoris: Secondary | ICD-10-CM

## 2024-02-21 DIAGNOSIS — E785 Hyperlipidemia, unspecified: Secondary | ICD-10-CM | POA: Diagnosis not present

## 2024-02-21 DIAGNOSIS — I739 Peripheral vascular disease, unspecified: Secondary | ICD-10-CM

## 2024-02-21 MED ORDER — HYDRALAZINE HCL 50 MG PO TABS
50.0000 mg | ORAL_TABLET | Freq: Two times a day (BID) | ORAL | 3 refills | Status: AC
  Filled 2024-02-21: qty 180, 90d supply, fill #0
  Filled 2024-02-25: qty 60, 30d supply, fill #0

## 2024-02-21 NOTE — Progress Notes (Signed)
 "  Advanced Hypertension Clinic Assessment:    Date:  02/21/2024   ID:  Kevin [name], DOB [birth year], MRN [id number]  PCP:  [name], MD  Cardiologist:  Vinie JAYSON Maxcy, MD  Nephrologist:  Referring MD: [name], MD   CC: Hypertension  History of Present Illness:    Kevin [name] is a 69 y.o. male with a hx of HTN, HLD, COPD, OSA on CPAP, HIV, PAD s/p left SFA intervention 07/2016, CKDIIIb, CAD, tobacco use, lung cancer. Here to follow up in the Advanced Hypertension Clinic.   Follows with Dr. Maxcy for general cardiology. Previous cardiac workup includes: Echo 12/2021 LVEF 60-65%, moderate LVH, gr1DD, mildly elevated PASP, no significant valvular abnormalities. Carotid duplex 08/2022 mild nonobstructive 1-39% stenosis bilaterally. Myoview  09/26/22 no ischemia, LVEF 43%.   Established with [facility] 10/05/22. Kevin [name] was diagnosed with hypertension more than 10 years ago. It has been difficult to control particularly with compliance. No formal exercise routine, not routinely following low sodium diet.   At initial visit 10/05/22 Amlodipine  and valsartan  were consolidated to Amlodipine -Valsartan  10-320mg  daily. Hdyralazine adjusted to 100mg  BID dosing for better adherence. Carotid duplex 08/2022 bilateral 1-39% stenosis. Was encouraged to resume using noninvasive ventilator at bedtime.   Admitted 8/29-8/30/24 with COPD exacerbation treated with Prednisone  and abx. Hydralazine  was increased to 100mg  TID.   Saw general cardiology team and Lopressor  was transitioned to Nebivolol  5mg  daily.  CT angio head/neck 10/25/22 mild soft plaque in thoracic aorta, no appreciable hemodynamically significant prox subclavian artery stenosis, mild plaque bilateral ICA but no significant stenosis, patent vertebral arteries.   Seen 11/2022 with home BP 160s.  Wearing noninvasive ventilator at night. Med list clarified with [facility] outpatient pharmacy and Hydralazine  100mg   TID, Amlodipine -Valsartan  10-320mg  daily, Nebivolol  5mg  daily delivered.   At visit 02/08/23 Imdur  60mg  daily added for hypertension.   Seen 05/24/23 with BP not at goal in setting of nonadherence.Since that time diagnosed with lung cancer and completed SBRT.   Admitted 07/2023 after presenting with weakness, altered gate. Found to have markedly elevated BP presumed due to nonadherence. MRI unremarkable. CTA head/neck with RICA <20% stenosis. Echo LVEF 60-65%, no RWMA, gr1dd. DIscharged on home regimen amlodipine , valsartan , hydralazine , bystolic .  At follow up 07/20/23 renal duplex ordered and performed with no renal artery stenosis, celiac artery with 70% stenosis.   Presents today for overdue follow up. Treated earlier this week at ED for chest pain and COPD exacerbation with abx. Breathing much improved. No recurrent chest pain. Chest pain during episode at rest, atypical for angina. Not checking BP at home, does have cuff. He notes he is not taking some of his medications however these were dispensed in his most recent pill pack and he reports taking medications in his pill pack. His hydralazine  however is not in his pill pack but reports taking 25mg  BID.   Previous antihypertensives: Coreg  - transitioned to cardioselective beta blocker Imdur  - headache  Past Medical History:  Diagnosis Date   AIDS (acquired immune deficiency syndrome) (HCC) 08/17/2016   Amaurosis fugax 08/18/2022   Chronic diastolic CHF (congestive heart failure), NYHA class 3 (HCC) 01/2016   Chronic lower back pain    CKD (chronic kidney disease), stage III (HCC)    stage 3b   COPD (chronic obstructive pulmonary disease) (HCC)    Dyspnea    Gout    forearms, hands, ankles, feet (06/05/2016)   Headache    weekly (06/05/2016)   Heart  murmur    never has caused any problems per pt   History of herpes zoster virus 01/2023   Hypertension    Hypertensive crisis 08/15/2017   Lipoma 07/06/2022   Neuromuscular  disorder (HCC) 02/05/2023   post herpetic neuralgia    -  Primary   OSA on CPAP    does not use CPAP   PAD (peripheral artery disease)    PAF (paroxysmal atrial fibrillation) (HCC) 01/2016   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   Pulmonary nodules 02/2023   Substance abuse (HCC)    in past    Past Surgical History:  Procedure Laterality Date   BRONCHIAL BIOPSY  06/11/2023   Procedure: BRONCHOSCOPY, WITH BIOPSY;  Surgeon: Shelah Lamar RAMAN, MD;  Location: [facility] ENDOSCOPY;  Service: Pulmonary;;   BRONCHIAL BRUSHINGS  06/11/2023   Procedure: BRONCHOSCOPY, WITH BRUSH BIOPSY;  Surgeon: Shelah Lamar RAMAN, MD;  Location: [facility] ENDOSCOPY;  Service: Pulmonary;;   BRONCHIAL NEEDLE ASPIRATION BIOPSY  06/11/2023   Procedure: BRONCHOSCOPY, WITH NEEDLE ASPIRATION BIOPSY;  Surgeon: Shelah Lamar RAMAN, MD;  Location: [facility] ENDOSCOPY;  Service: Pulmonary;;   BRONCHIAL WASHINGS  06/11/2023   Procedure: IRRIGATION, BRONCHUS;  Surgeon: Shelah Lamar RAMAN, MD;  Location: [facility] ENDOSCOPY;  Service: Pulmonary;;   COLONOSCOPY N/A 04/24/2023   Procedure: COLONOSCOPY;  Surgeon: Rosalie Kitchens, MD;  Location: [facility] ENDOSCOPY;  Service: Gastroenterology;  Laterality: N/A;   IR RADIOLOGIST EVAL & MGMT  05/31/2021   IR RADIOLOGIST EVAL & MGMT  07/26/2021   LEFT HEART CATH AND CORONARY ANGIOGRAPHY N/A 10/23/2016   Procedure: LEFT HEART CATH AND CORONARY ANGIOGRAPHY;  Surgeon: Anner Alm ORN, MD;  Location: [facility] INVASIVE CV LAB;  Service: Cardiovascular;  Laterality: N/A;   LOWER EXTREMITY ANGIOGRAPHY N/A 07/17/2016   Procedure: Lower Extremity Angiography;  Surgeon: Court Dorn PARAS, MD;  Location: [facility] INVASIVE CV LAB;  Service: Cardiovascular;  Laterality: N/A;   LOWER EXTREMITY INTERVENTION N/A 06/05/2016   Procedure: Lower Extremity Intervention;  Surgeon: Dorn PARAS Court, MD;  Location: [facility] INVASIVE CV LAB;  Service: Cardiovascular;  Laterality: N/A;   PERIPHERAL VASCULAR ATHERECTOMY Right 07/17/2016   Procedure: Peripheral Vascular  Atherectomy;  Surgeon: Court Dorn PARAS, MD;  Location: [facility] INVASIVE CV LAB;  Service: Cardiovascular;  Laterality: Right;  SFA   PERIPHERAL VASCULAR INTERVENTION  06/05/2016   Procedure: Peripheral Vascular Intervention;  Surgeon: Dorn PARAS Court, MD;  Location: [facility] INVASIVE CV LAB;  Service: Cardiovascular;;  left SFA   POLYPECTOMY  04/24/2023   Procedure: POLYPECTOMY;  Surgeon: Rosalie Kitchens, MD;  Location: [facility] ENDOSCOPY;  Service: Gastroenterology;;   RADIOLOGY WITH ANESTHESIA Right 06/29/2021   Procedure: RIGHT RENAL CRYOABLATION;  Surgeon: Philip Cornet, MD;  Location: [facility] ORS;  Service: Anesthesiology;  Laterality: Right;   VIDEO BRONCHOSCOPY WITH ENDOBRONCHIAL NAVIGATION Right 06/11/2023   Procedure: VIDEO BRONCHOSCOPY WITH ENDOBRONCHIAL NAVIGATION;  Surgeon: Shelah Lamar RAMAN, MD;  Location: [facility] ENDOSCOPY;  Service: Pulmonary;  Laterality: Right;  WITH FLUORO AND BIOPSY    Current Medications: Current Meds  Medication Sig   albuterol  (VENTOLIN  HFA) 108 (90 Base) MCG/ACT inhaler Inhale 1-2 puffs into the lungs every 6 (six) hours as needed for wheezing or shortness of breath.   amitriptyline  (ELAVIL ) 10 MG tablet Take 1 tablet (10 mg total) by mouth at bedtime.   amLODipine -valsartan  (EXFORGE ) 10-320 MG tablet Take 1 tablet by mouth daily.   aspirin  EC (ASPIRIN  LOW DOSE) 81 MG tablet Take 1 tablet (81 mg total) by mouth daily, swallow whole   ciclopirox  (PENLAC )  8 % solution Apply topically at bedtime. Apply over nail and surrounding skin. Apply daily over previous coat. After seven (7) days, may remove with alcohol and continue cycle.   Fluticasone -Umeclidin-Vilant (TRELEGY ELLIPTA ) 200-62.5-25 MCG/ACT AEPB Inhale 1 puff into the lungs daily.   hydrALAZINE  (APRESOLINE ) 25 MG tablet Take 1 tablet (25 mg total) by mouth 2 (two) times daily.   ipratropium-albuterol  (DUONEB) 0.5-2.5 (3) MG/3ML SOLN Take 3 mLs by nebulization every 6 (six) hours as needed. (Patient taking differently: Take 3 mLs by  nebulization every 6 (six) hours as needed (for shortness of breath or wheezing).)   temazepam  (RESTORIL ) 15 MG capsule Take 1-2 capsules (15-30 mg total) by mouth at bedtime as needed for sleep.   Vitamin D , Ergocalciferol , (DRISDOL ) 1.25 MG (50000 UNIT) CAPS capsule Take 1 capsule (50,000 Units total) by mouth once a week for 16 weeks. Then recheck level in the office.     Allergies:   Isosorbide  mononitrate [isosorbide  nitrate]   Social History   Socioeconomic History   Marital status: Significant Other    Spouse name: Not on file   Number of children: Not on file   Years of education: Not on file   Highest education level: Not on file  Occupational History   Not on file  Tobacco Use   Smoking status: Some Days    Current packs/day: 0.10    Average packs/day: 0.1 packs/day for 42.0 years (4.2 ttl pk-yrs)    Types: Cigarettes    Passive exposure: Never   Smokeless tobacco: Never   Tobacco comments:    Has not smoked since May 4th.2025-06/20/2023        Pt smoke 4-5 cigarettes per day  Vaping Use   Vaping status: Former   Substances: Nicotine , Flavoring  Substance and Sexual Activity   Alcohol use: Not Currently    Alcohol/week: 2.0 standard drinks of alcohol    Types: 2 Cans of beer per week    Comment: rare   Drug use: Not Currently    Comment: rare   Sexual activity: Not Currently    Comment: declined condoms  Other Topics Concern   Not on file  Social History Narrative   Right handed   Social Drivers of Health   Tobacco Use: High Risk (02/21/2024)   Patient History    Smoking Tobacco Use: Some Days    Smokeless Tobacco Use: Never    Passive Exposure: Never  Financial Resource Strain: Low Risk (01/21/2024)   Overall Financial Resource Strain (CARDIA)    Difficulty of Paying Living Expenses: Not hard at all  Food Insecurity: No Food Insecurity (01/21/2024)   Epic    Worried About Radiation Protection Practitioner of Food in the Last Year: Never true    Ran Out of Food in the Last  Year: Never true  Transportation Needs: No Transportation Needs (01/21/2024)   Epic    Lack of Transportation (Medical): No    Lack of Transportation (Non-Medical): No  Physical Activity: Inactive (01/21/2024)   Exercise Vital Sign    Days of Exercise per Week: 0 days    Minutes of Exercise per Session: 0 min  Stress: No Stress Concern Present (01/21/2024)   [employer] of Occupational Health - Occupational Stress Questionnaire    Feeling of Stress: Not at all  Social Connections: Socially Isolated (01/21/2024)   Social Connection and Isolation Panel    Frequency of Communication with Friends and Family: More than three times a week    Frequency of Social  Gatherings with Friends and Family: Three times a week    Attends Religious Services: Never    Active Member of Clubs or Organizations: No    Attends Banker Meetings: Never    Marital Status: Separated  Depression (PHQ2-9): Low Risk (01/21/2024)   Depression (PHQ2-9)    PHQ-2 Score: 3  Alcohol Screen: Low Risk (01/21/2024)   Alcohol Screen    Last Alcohol Screening Score (AUDIT): 0  Housing: Unknown (01/21/2024)   Epic    Unable to Pay for Housing in the Last Year: No    Number of Times Moved in the Last Year: Not on file    Homeless in the Last Year: No  Utilities: Not At Risk (01/21/2024)   Epic    Threatened with loss of utilities: No  Health Literacy: Adequate Health Literacy (01/21/2024)   B1300 Health Literacy    Frequency of need for help with medical instructions: Never     Family History: The patient's family history includes Heart attack in his daughter; High blood pressure in his mother; Lupus in his mother; Seizures in his daughter.  ROS:   Please see the history of present illness.    All other systems reviewed and are negative.  EKGs/Labs/Other Studies Reviewed:         Recent Labs: 03/18/2023: B Natriuretic Peptide 39.9 09/29/2023: Pro Brain Natriuretic Peptide 450.0 02/04/2024: ALT  11 02/16/2024: BUN 24; Creatinine, Ser 2.22; Hemoglobin 11.2; Platelets 273; Potassium 4.3; Sodium 140   Recent Lipid Panel    Component Value Date/Time   CHOL 190 10/24/2022 1432   TRIG 79 10/24/2022 1432   HDL 79 10/24/2022 1432   CHOLHDL 2.4 10/24/2022 1432   CHOLHDL 3.5 07/06/2022 1145   VLDL 41 (H) 04/04/2019 0458   LDLCALC 97 10/24/2022 1432   LDLCALC 110 (H) 07/06/2022 1145    Physical Exam:   VS:  BP (!) 152/78   Pulse 79   Ht 6' (1.829 m)   Wt 157 lb 1.6 oz (71.3 kg)   SpO2 97%   BMI 21.31 kg/m  , BMI Body mass index is 21.31 kg/m.  Vitals:   02/21/24 1411  BP: (!) 152/78  Pulse: 79  Height: 6' (1.829 m)  Weight: 157 lb 1.6 oz (71.3 kg)  SpO2: 97%  BMI (Calculated): 21.3   GENERAL:  Well appearing HEENT: Pupils equal round and reactive, fundi not visualized, oral mucosa unremarkable NECK:  No jugular venous distention, waveform within normal limits, carotid upstroke brisk and symmetric, no bruits, no thyromegaly LYMPHATICS:  No cervical adenopathy LUNGS:  Clear to auscultation bilaterally HEART:  RRR.  PMI not displaced or sustained,S1 and S2 within normal limits, no S3, no S4, no clicks, no rubs, no murmurs ABD:  Flat, positive bowel sounds normal in frequency in pitch, no bruits, no rebound, no guarding, no midline pulsatile mass, no hepatomegaly, no splenomegaly EXT:  2 plus pulses throughout, no edema, no cyanosis no clubbing SKIN:  No rashes no nodules NEURO:  Cranial nerves II through XII grossly intact, motor grossly intact throughout PSYCH:  Cognitively intact, oriented to person place and time   ASSESSMENT/PLAN:    HTN / Medication management - Antihypertensive regimen limited by renal function. Would avoid diuretic or MRA without nephrology input. Continue to utilize [facility] for adherence packaging and delivery.  Continue Amlodipine -Valsartan  10-320mg  daily, Nebivolol  5mg  daily. Increase Hydralazine  to 50mg  BID.  Renal duplex  07/2023 no RAS.  Discussed to monitor BP at home at  least 2 hours after medications and sitting for 5-10 minutes.  OSA - Encouraged to use noninvasive ventilator.    COPD / Lung cancer - Follows with PCP and pulmonology. Breathing improved after recent treatment for COPD exacerbation.   CKD - Established with Dr. Fleurette of nephrology.  Careful titration of diuretic and antihypertensive.    CAD / HLD - Stable with no anginal symptoms. Per general cardiology team. GDMT aspirin  g81mg  daily, nebivolol  5mg  daily, atorvastatin  80mg  daily. Recommend aiming for 150 minutes of moderate intensity activity per week and following a heart healthy diet.    Tobacco use - Smoking cessation encouraged. Recommend utilization of [id number].   PAD - Upcoming peripheral angiography.SABRA Follows with [facility].   Screening for Secondary Hypertension:     10/10/2022    9:11 PM  Causes  Renovascular HTN Screened     - Comments 04/2016 renal duplex with no stenosis  Sleep Apnea Screened     - Comments 09/2022 recommended to resume use of nonivasive ventilator  Thyroid  Disease Screened     - Comments 04/2022 normal TSH  Cushing's Syndrome N/A     - Comments non cushingoid appearance    Relevant Labs/Studies:    Latest Ref Rng & Units 02/16/2024    6:37 AM 02/04/2024    8:37 AM 09/29/2023    9:20 PM  Basic Labs  Sodium 135 - 145 mmol/L 140  139  142   Potassium 3.5 - 5.1 mmol/L 4.3  4.2  4.7   Creatinine 0.61 - 1.24 mg/dL 7.77  7.63         Latest Ref Rng & Units 04/28/2022    3:32 PM 07/14/2021    8:41 AM  Thyroid    TSH 0.40 - 4.50 mIU/L 1.28  0.511                 08/13/2023   10:09 AM  Renovascular   Renal Artery US  Completed Yes      Disposition:    FU with MD/PharmD/APP in 2-3 mos   Medication Adjustments/Labs and Tests Ordered: Current medicines are reviewed at length with the patient today.  Concerns regarding medicines are outlined above.  No orders of the defined types were placed in this  encounter.  No orders of the defined types were placed in this encounter.    Signed, Reche GORMAN Finder, NP  02/21/2024 2:38 PM    [facility] "

## 2024-02-21 NOTE — Patient Instructions (Signed)
 Medication Instructions:  CHANGE Hydralazine  to 50mg  twice per day  This will be in  your new pill pack   Follow-Up: Please follow up in 2-3 months in ADV HTN [facility]INIC with Dr. Raford, Reche Finder, NP or Kristin Alvstad PharmD

## 2024-02-22 ENCOUNTER — Other Ambulatory Visit: Payer: Self-pay

## 2024-02-25 ENCOUNTER — Other Ambulatory Visit: Payer: Self-pay

## 2024-02-25 ENCOUNTER — Other Ambulatory Visit (HOSPITAL_COMMUNITY): Payer: Self-pay

## 2024-02-26 ENCOUNTER — Other Ambulatory Visit: Payer: Self-pay

## 2024-02-28 ENCOUNTER — Ambulatory Visit: Payer: 59 | Admitting: Internal Medicine

## 2024-03-06 ENCOUNTER — Other Ambulatory Visit: Payer: Self-pay

## 2024-03-10 ENCOUNTER — Other Ambulatory Visit (HOSPITAL_COMMUNITY): Payer: Self-pay

## 2024-03-10 ENCOUNTER — Other Ambulatory Visit: Payer: Self-pay

## 2024-03-10 NOTE — Progress Notes (Signed)
 Specialty Pharmacy Refill Coordination Note  Kevin [name] is a 69 y.o. male contacted today regarding refills of specialty medication(s) Dolutegravir -lamiVUDine  (Dovato )   Patient requested Delivery   Delivery date: 03/17/24   Verified address: [address] [place], [place], [address]   Medication will be filled on: 03/14/24   Patient is aware of new copay of $4.90. patient to call back with updated payment information.

## 2024-03-12 ENCOUNTER — Other Ambulatory Visit: Payer: Self-pay

## 2024-03-14 ENCOUNTER — Other Ambulatory Visit (HOSPITAL_COMMUNITY): Payer: Self-pay

## 2024-03-14 ENCOUNTER — Ambulatory Visit (HOSPITAL_COMMUNITY)
Admission: RE | Admit: 2024-03-14 | Discharge: 2024-03-14 | Disposition: A | Discharge status: Home / Self Care | Attending: Vascular Surgery | Admitting: Vascular Surgery

## 2024-03-14 ENCOUNTER — Other Ambulatory Visit: Payer: Self-pay

## 2024-03-14 ENCOUNTER — Encounter (HOSPITAL_COMMUNITY)
Admission: RE | Disposition: A | Discharge status: Home / Self Care | Payer: Self-pay | Source: Home / Self Care | Attending: Vascular Surgery

## 2024-03-14 DIAGNOSIS — Z7982 Long term (current) use of aspirin: Secondary | ICD-10-CM | POA: Diagnosis not present

## 2024-03-14 DIAGNOSIS — I70213 Atherosclerosis of native arteries of extremities with intermittent claudication, bilateral legs: Secondary | ICD-10-CM

## 2024-03-14 DIAGNOSIS — Z79899 Other long term (current) drug therapy: Secondary | ICD-10-CM | POA: Diagnosis not present

## 2024-03-14 DIAGNOSIS — F17210 Nicotine dependence, cigarettes, uncomplicated: Secondary | ICD-10-CM | POA: Insufficient documentation

## 2024-03-14 LAB — POCT I-STAT, CHEM 8
BUN: 21 mg/dL (ref 8–23)
Calcium, Ion: 1.24 mmol/L (ref 1.15–1.40)
Chloride: 109 mmol/L (ref 98–111)
Creatinine, Ser: 2.3 mg/dL — ABNORMAL HIGH (ref 0.61–1.24)
Glucose, Bld: 87 mg/dL (ref 70–99)
HCT: 33 % — ABNORMAL LOW (ref 39.0–52.0)
Hemoglobin: 11.2 g/dL — ABNORMAL LOW (ref 13.0–17.0)
Potassium: 4.1 mmol/L (ref 3.5–5.1)
Sodium: 143 mmol/L (ref 135–145)
TCO2: 23 mmol/L (ref 22–32)

## 2024-03-14 LAB — POCT ACTIVATED CLOTTING TIME: Activated Clotting Time: 256 s

## 2024-03-14 MED ORDER — CLOPIDOGREL BISULFATE 300 MG PO TABS
ORAL_TABLET | ORAL | Status: AC
  Filled 2024-03-14: qty 1

## 2024-03-14 MED ORDER — SODIUM CHLORIDE 0.9 % IV SOLN: 250.0000 mL | INTRAVENOUS | Status: DC | PRN

## 2024-03-14 MED ORDER — SODIUM CHLORIDE 0.9% FLUSH: 3.0000 mL | Freq: Two times a day (BID) | INTRAVENOUS | Status: DC

## 2024-03-14 MED ORDER — FENTANYL CITRATE (PF) 100 MCG/2ML IJ SOLN
INTRAMUSCULAR | Status: AC
  Filled 2024-03-14: qty 2

## 2024-03-14 MED ORDER — ACETAMINOPHEN 325 MG PO TABS: 650.0000 mg | ORAL_TABLET | ORAL | Status: DC | PRN

## 2024-03-14 MED ORDER — PROTAMINE SULFATE 10 MG/ML IV SOLN
INTRAVENOUS | Status: AC
  Filled 2024-03-14: qty 5

## 2024-03-14 MED ORDER — DIPHENHYDRAMINE HCL 50 MG/ML IJ SOLN
INTRAMUSCULAR | Status: DC | PRN
  Administered 2024-03-14: 25 mg via INTRAVENOUS

## 2024-03-14 MED ORDER — SODIUM CHLORIDE 0.9% FLUSH: 3.0000 mL | INTRAVENOUS | Status: DC | PRN

## 2024-03-14 MED ORDER — LIDOCAINE HCL (PF) 1 % IJ SOLN
INTRAMUSCULAR | Status: AC
  Filled 2024-03-14: qty 30

## 2024-03-14 MED ORDER — SODIUM CHLORIDE 0.9 % IV SOLN: INTRAVENOUS | Status: DC

## 2024-03-14 MED ORDER — DIPHENHYDRAMINE HCL 50 MG/ML IJ SOLN
INTRAMUSCULAR | Status: AC
  Filled 2024-03-14: qty 1

## 2024-03-14 MED ORDER — HYDRALAZINE HCL 20 MG/ML IJ SOLN
INTRAMUSCULAR | Status: AC
  Filled 2024-03-14: qty 1

## 2024-03-14 MED ORDER — LIDOCAINE HCL (PF) 1 % IJ SOLN
INTRAMUSCULAR | Status: DC | PRN
  Administered 2024-03-14: 15 mL

## 2024-03-14 MED ORDER — HEPARIN SODIUM (PORCINE) 1000 UNIT/ML IJ SOLN
INTRAMUSCULAR | Status: DC | PRN
  Administered 2024-03-14: 10000 [IU] via INTRAVENOUS

## 2024-03-14 MED ORDER — ASPIRIN 81 MG PO CHEW
CHEWABLE_TABLET | ORAL | Status: DC | PRN
  Administered 2024-03-14: 81 mg via ORAL

## 2024-03-14 MED ORDER — HEPARIN SODIUM (PORCINE) 1000 UNIT/ML IJ SOLN
INTRAMUSCULAR | Status: AC
  Filled 2024-03-14: qty 10

## 2024-03-14 MED ORDER — SODIUM CHLORIDE 0.9 % WEIGHT BASED INFUSION: 1.0000 mL/kg/h | INTRAVENOUS | Status: DC

## 2024-03-14 MED ORDER — CLOPIDOGREL BISULFATE 75 MG PO TABS: 75.0000 mg | ORAL_TABLET | Freq: Every day | ORAL | Status: DC

## 2024-03-14 MED ORDER — CLOPIDOGREL BISULFATE 75 MG PO TABS
75.0000 mg | ORAL_TABLET | Freq: Every day | ORAL | 11 refills | Status: AC
  Filled 2024-03-14: qty 30, 30d supply, fill #0

## 2024-03-14 MED ORDER — MIDAZOLAM HCL (PF) 2 MG/2ML IJ SOLN
INTRAMUSCULAR | Status: DC | PRN
  Administered 2024-03-14: 1 mg via INTRAVENOUS

## 2024-03-14 MED ORDER — FENTANYL CITRATE (PF) 100 MCG/2ML IJ SOLN
INTRAMUSCULAR | Status: DC | PRN
  Administered 2024-03-14: 25 ug via INTRAVENOUS
  Administered 2024-03-14: 50 ug via INTRAVENOUS

## 2024-03-14 MED ORDER — LABETALOL HCL 5 MG/ML IV SOLN: 10.0000 mg | INTRAVENOUS | Status: DC | PRN

## 2024-03-14 MED ORDER — HYDRALAZINE HCL 20 MG/ML IJ SOLN: 5.0000 mg | INTRAMUSCULAR | Status: DC | PRN

## 2024-03-14 MED ORDER — ASPIRIN 81 MG PO CHEW
CHEWABLE_TABLET | ORAL | Status: AC
  Filled 2024-03-14: qty 1

## 2024-03-14 MED ORDER — HYDRALAZINE HCL 20 MG/ML IJ SOLN
INTRAMUSCULAR | Status: DC | PRN
  Administered 2024-03-14: 20 mg via INTRAVENOUS

## 2024-03-14 MED ORDER — PROTAMINE SULFATE 10 MG/ML IV SOLN
INTRAVENOUS | Status: DC | PRN
  Administered 2024-03-14: 50 mg via INTRAVENOUS

## 2024-03-14 MED ORDER — MIDAZOLAM HCL 2 MG/2ML IJ SOLN
INTRAMUSCULAR | Status: AC
  Filled 2024-03-14: qty 2

## 2024-03-14 MED ORDER — ONDANSETRON HCL 4 MG/2ML IJ SOLN: 4.0000 mg | Freq: Four times a day (QID) | INTRAMUSCULAR | Status: DC | PRN

## 2024-03-14 MED ORDER — CLOPIDOGREL BISULFATE 300 MG PO TABS
ORAL_TABLET | ORAL | Status: DC | PRN
  Administered 2024-03-14: 300 mg via ORAL

## 2024-03-14 MED ORDER — IODIXANOL 320 MG/ML IV SOLN
INTRAVENOUS | Status: DC | PRN
  Administered 2024-03-14: 35 mL via INTRA_ARTERIAL

## 2024-03-14 MED ORDER — CLOPIDOGREL BISULFATE 75 MG PO TABS: 300.0000 mg | ORAL_TABLET | Freq: Once | ORAL | Status: DC

## 2024-03-14 MED ORDER — HEPARIN (PORCINE) IN NACL 1000-0.9 UT/500ML-% IV SOLN
INTRAVENOUS | Status: DC | PRN
  Administered 2024-03-14: 1000 mL via SURGICAL_CAVITY

## 2024-03-14 NOTE — Op Note (Signed)
 DATE OF SERVICE: 03/14/2024  PATIENT:  Kevin [name]  69 y.o. 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 Magda, MD  ASSISTANT: none  ANESTHESIA:   local and IV sedation  ESTIMATED BLOOD LOSS: min  LOCAL MEDICATIONS USED:  LIDOCAINE    COUNTS: confirmed correct.  PATIENT DISPOSITION:  PACU - hemodynamically stable.   Delay start of Pharmacological VTE agent (>24hrs) due to surgical blood loss or risk of bleeding: no  INDICATION FOR PROCEDURE: [name] is a 69 y.o. male with peripheral arterial disease with claudication in both legs. He feels his right leg is worse. After careful discussion of risks, benefits, and alternatives the patient was offered angiogram. The patient understood and wished to proceed.  OPERATIVE FINDINGS:   Aortogram (CO2): Renal arteries Left: patent  Right: patent Infrarenal aorta: patent Common iliac arteries: Left: patent  Right: patent Internal iliac arteries: Left: patent  Right: patent External iliac arteries: Left: patent  Right: patent  Right Lower Extremity Angiogram:  Common femoral artery: patent  Profunda femoris artery: patent  Superficial femoral artery: patent proximally; diffuse disease throughout. Multifocal stenosis - worst 70%.  Popliteal artery: diffuse disease throughout. Multifocal stenosis - worst 70%.  Anterior tibial  artery: occluded  Tibioperoneal trunk: tortuous, multifocal stenosis, worst 70%  Peroneal artery: only patent calf vessel; proximal stenosis of 50%  Posterior tibial artery: occluded  Pedal circulation: fills via peroneal collaterals  GLASS score. FP: 4. IP: 1. Stage III.  WIfI score. N/A - no wound.  DESCRIPTION OF PROCEDURE: After identification of the patient in the pre-operative holding area, the patient was transferred to the operating room. The patient was positioned supine on the operating room table. Anesthesia was induced. The groins was prepped and draped in standard fashion. A surgical pause was performed confirming correct patient, procedure, and operative location.  The left groin was anesthetized with subcutaneous injection of 1% lidocaine . Using ultrasound guidance, the right common femoral artery was accessed with micropuncture technique.  Fluoroscopy was used to confirm cannulation over the femoral head. The 3362F micropuncture sheath was upsized to 362F.   A Benson wire was advanced into the distal aorta. Over the wire an omni flush catheter was advanced to the level of L2. Aortogram was performed - see above for details.   The right common iliac artery was selected with an omniflush catheter and glidewire guidewire. The wire was advanced into the common femoral artery. Over the wire the omni flush catheter was advanced into the external iliac artery. Selective angiography was performed - see above for details.   The decision was made to intervene. The patient was heparinized with 10,000 units of heparin . The 362F sheath was exchanged for a 12F x 45cm sheath. Selective angiography of the right lower extremity performed prior to intervention from the popliteal artery. I also took a pressure measurement across the FP lesion showing a pressure differential of >65mmHg.   The lesions were treated with: Right femoropopliteal drug-coated angioplasty (6x234mm INPact) Right below knee  popliteal, tibioperoneal trunk and peroneal drug-coated angioplasty (4x178mm Ranger)  Completion angiography revealed:  Resolution of FP stenosis Non-flow limting dissection in the TP trunk after angioplasty. Elected to observe.  A perclose was used to close the arteriotomy. Hemostasis was excellent upon completion.   Conscious sedation was administered with the use of IV fentanyl  and midazolam  under continuous physician and nurse monitoring.  Heart rate, blood pressure, and oxygen saturation were continuously monitored.  Total sedation time was 44 minutes  Upon completion of the case instrument and sharps counts were confirmed correct. The patient was transferred to the PACU in good condition. I was present for all portions of the procedure.  PLAN: ASA / Plavix  / Statin therapy. Follow up with me for LLE angiogram.   Debby SAILOR. Magda, MD [facility] Phone Number: [phone] 03/14/2024 8:43 AM

## 2024-03-14 NOTE — Progress Notes (Signed)
 Patient was contacted by the pharmacy regarding their specialty medication(s) Dolutegravir -lamiVUDine  (Dovato ) to reschedule a later delivery date, due to impending winter weather conditions. Medication(s) will be filled 03/20/24 for a delivery by 03/21/24

## 2024-03-14 NOTE — Interval H&P Note (Signed)
 History and Physical Interval Note:  03/14/2024 7:44 AM  Kevin [name]  has presented today for surgery, with the diagnosis of athro sclerosis ; bilateral LE.  The various methods of treatment have been discussed with the patient and family. After consideration of risks, benefits and other options for treatment, the patient has consented to  Procedures: ABDOMINAL AORTOGRAM W/LOWER EXTREMITY (N/A) LOWER EXTREMITY INTERVENTION (N/A) as a surgical intervention.  The patient's history has been reviewed, patient examined, no change in status, stable for surgery.  I have reviewed the patient's chart and labs.  Questions were answered to the patient's satisfaction.     Debby LOISE Robertson

## 2024-03-14 NOTE — Progress Notes (Signed)
 Patient and daughter was given discharge instructions. Both verbalized understanding.

## 2024-03-15 ENCOUNTER — Encounter (HOSPITAL_COMMUNITY): Payer: Self-pay | Admitting: Vascular Surgery

## 2024-03-17 ENCOUNTER — Other Ambulatory Visit: Payer: Self-pay

## 2024-03-17 ENCOUNTER — Ambulatory Visit: Payer: Self-pay

## 2024-03-17 ENCOUNTER — Encounter (HOSPITAL_COMMUNITY): Payer: Self-pay

## 2024-03-17 ENCOUNTER — Emergency Department (HOSPITAL_COMMUNITY)
Admission: EM | Admit: 2024-03-17 | Discharge: 2024-03-18 | Disposition: A | Attending: Emergency Medicine | Admitting: Emergency Medicine

## 2024-03-17 ENCOUNTER — Emergency Department (HOSPITAL_COMMUNITY)

## 2024-03-17 DIAGNOSIS — M79604 Pain in right leg: Secondary | ICD-10-CM | POA: Insufficient documentation

## 2024-03-17 DIAGNOSIS — Z7902 Long term (current) use of antithrombotics/antiplatelets: Secondary | ICD-10-CM | POA: Insufficient documentation

## 2024-03-17 DIAGNOSIS — Z7982 Long term (current) use of aspirin: Secondary | ICD-10-CM | POA: Insufficient documentation

## 2024-03-17 LAB — CBC
HCT: 35.4 % — ABNORMAL LOW (ref 39.0–52.0)
Hemoglobin: 11.9 g/dL — ABNORMAL LOW (ref 13.0–17.0)
MCH: 32.1 pg (ref 26.0–34.0)
MCHC: 33.6 g/dL (ref 30.0–36.0)
MCV: 95.4 fL (ref 80.0–100.0)
Platelets: 321 10*3/uL (ref 150–400)
RBC: 3.71 MIL/uL — ABNORMAL LOW (ref 4.22–5.81)
RDW: 13.5 % (ref 11.5–15.5)
WBC: 8.4 10*3/uL (ref 4.0–10.5)
nRBC: 0 % (ref 0.0–0.2)

## 2024-03-17 LAB — BASIC METABOLIC PANEL WITH GFR
Anion gap: 11 (ref 5–15)
BUN: 23 mg/dL (ref 8–23)
CO2: 23 mmol/L (ref 22–32)
Calcium: 9 mg/dL (ref 8.9–10.3)
Chloride: 105 mmol/L (ref 98–111)
Creatinine, Ser: 2.32 mg/dL — ABNORMAL HIGH (ref 0.61–1.24)
GFR, Estimated: 30 mL/min — ABNORMAL LOW
Glucose, Bld: 80 mg/dL (ref 70–99)
Potassium: 4.7 mmol/L (ref 3.5–5.1)
Sodium: 139 mmol/L (ref 135–145)

## 2024-03-17 MED ORDER — OXYCODONE-ACETAMINOPHEN 5-325 MG PO TABS
ORAL_TABLET | ORAL | Status: AC
  Filled 2024-03-17: qty 1

## 2024-03-17 MED ORDER — OXYCODONE-ACETAMINOPHEN 5-325 MG PO TABS
1.0000 | ORAL_TABLET | Freq: Once | ORAL | Status: AC
  Administered 2024-03-17: 1 via ORAL

## 2024-03-17 MED ORDER — IOHEXOL 350 MG/ML SOLN
100.0000 mL | Freq: Once | INTRAVENOUS | Status: AC | PRN
  Administered 2024-03-17: 100 mL via INTRAVENOUS

## 2024-03-17 MED ORDER — SODIUM CHLORIDE 0.9 % IV BOLUS
1000.0000 mL | Freq: Once | INTRAVENOUS | Status: AC
  Administered 2024-03-17: 1000 mL via INTRAVENOUS

## 2024-03-17 NOTE — Telephone Encounter (Signed)
 I have contacted Mr Kevin [name] to make sure that he was aware that our office is closed today. He was scheduled an appt with Dr [name] on 03/19/24. Pt has been notified that we are not in the office and that we wont be until 10am tomorrow. He has been told that this message will be forwarded to Dr Ely LIPS, but I have reiterated to the pt that he needs to go to the ER as well as call his surgeons office if he feels that he needs care right away.

## 2024-03-17 NOTE — ED Notes (Signed)
 Patient upset about wait times for triage.  Patient explained the triage process and how acuity works.  Patient appreciative of explanation and is currently awaiting MSE by PA.

## 2024-03-17 NOTE — ED Notes (Signed)
 Patient transported to CT

## 2024-03-18 MED ORDER — HYDROCODONE-ACETAMINOPHEN 5-325 MG PO TABS: 2.0000 | ORAL_TABLET | ORAL | 0 refills | Status: AC | PRN

## 2024-03-19 ENCOUNTER — Ambulatory Visit: Admitting: Family Medicine

## 2024-03-19 ENCOUNTER — Other Ambulatory Visit: Payer: Self-pay

## 2024-03-19 VITALS — BP 171/113 | HR 69 | Temp 98.2°F | Ht 72.0 in | Wt 152.6 lb

## 2024-03-19 DIAGNOSIS — I739 Peripheral vascular disease, unspecified: Secondary | ICD-10-CM

## 2024-03-19 MED ORDER — CILOSTAZOL 50 MG PO TABS
50.0000 mg | ORAL_TABLET | Freq: Two times a day (BID) | ORAL | 0 refills | Status: AC
  Filled 2024-03-19 (×2): qty 60, 30d supply, fill #0

## 2024-03-19 NOTE — Patient Instructions (Addendum)
 It was very nice to see you today!  Please take cilastozol 50 mg twice day for blood flow in legs and discomfort.  Follow up with Dr. Vonzell for procedure.   VISIT SUMMARY: Today, you were seen for right lower extremity pain related to peripheral vascular disease. We discussed your recent stent placement, ongoing symptoms, and the results of your recent CT angiogram. We also reviewed your current medications and addressed your concerns about pain management and post-operative guidance.  YOUR PLAN: PERIPHERAL ARTERY DISEASE (PAD): You have chronic peripheral artery disease with recent stent placement in your right leg and planned stent placement in your left leg. You are experiencing ischemic pain at rest, indicating significant vascular compromise. -Start taking cilostazol  50 mg twice daily to improve blood flow and reduce claudication pain. -We have sent the prescription to your pharmacy. -Follow up with vascular surgery for further management and potential stent placement in your left leg. -We provided a note for screen door installation due to your chronic medical conditions.  Return if symptoms worsen or fail to improve.   Take care, Arvella Hummer, MD, MS   PLEASE NOTE:  If you had any lab tests, please let us  know if you have not heard back within a few days. You may see your results on mychart before we have a chance to review them but we will give you a call once they are reviewed by us .   If we ordered any referrals today, please let us  know if you have not heard from their office within the next week.   If you had any urgent prescriptions sent in today, please check with the pharmacy within an hour of our visit to make sure the prescription was transmitted appropriately.   Please try these tips to maintain a healthy lifestyle:  Eat at least 3 REAL meals and 1-2 snacks per day.  Aim for no more than 5 hours between eating.  If you eat breakfast, please do so within one hour of  getting up.   Each meal should contain half fruits/vegetables, one quarter protein, and one quarter carbs (no bigger than a computer mouse)  Cut down on sweet beverages. This includes juice, soda, and sweet tea.   Drink at least 1 glass of water with each meal and aim for at least 8 glasses per day  Exercise at least 150 minutes every week.

## 2024-03-19 NOTE — Progress Notes (Signed)
 " Assessment & Plan   Assessment/Plan:    Assessment & Plan PAD (peripheral artery disease) Chronic peripheral artery disease with recent stent placement in the right leg and planned stent placement in the left leg. Reports ischemic pain at rest, not just during exertion, indicating significant vascular compromise. CT angiography shows multiple areas of stenosis in the right superior femoral artery and left internal iliac artery. Pain likely due to inadequate blood flow, with referred pain to the abdomen. Current management includes clopidogrel  and aspirin . Hydrocodone  was prescribed but not covered by insurance. No current exercise program in place. - Prescribed cilostazol  50 mg twice daily to improve blood flow and reduce claudication pain. - Sent prescription to pharmacy. - Encouraged follow-up with vascular surgery for further management and potential stent placement in the left leg. - Provided note for screen door installation due to chronic medical conditions.        Medications Discontinued During This Encounter  Medication Reason   celecoxib (CELEBREX) 200 MG capsule    ciclopirox  (PENLAC ) 8 % solution     Return if symptoms worsen or fail to improve.        Subjective:   Encounter date: 03/19/2024  ARATH KAIGLER is a 69 y.o. male who has Shortness of breath; Paroxysmal A-fib (HCC); Tobacco abuse; COPD with chronic bronchitis and emphysema (HCC); Blurry vision, bilateral; Hypertensive cardiovascular disease; Essential hypertension; OSA (obstructive sleep apnea); PAD (peripheral artery disease); CKD stage 3b, GFR 30-44 ml/min (HCC) - baseline SCr 1.8-2.2; Claudication in peripheral vascular disease; HIV (human immunodeficiency virus infection) (HCC); Easy bruising; CAD (coronary artery disease); Hypertensive urgency; Callus of foot; Acute respiratory failure with hypoxia (HCC); Right renal mass; Renal lesion; Chronic heart failure with preserved ejection fraction (HFpEF)  (HCC); HLD (hyperlipidemia); Elevated troponin; Chronic low back pain; Lipoma; Lung nodule, multiple; Resistant hypertension; Skin nodule; Anxiety; COPD exacerbation (HCC); COPD with acute exacerbation (HCC); Left renal mass; HZV (herpes zoster virus) post herpetic neuralgia; 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   He  has a past medical history of AIDS (acquired immune deficiency syndrome) (HCC) (08/17/2016), Amaurosis fugax (08/18/2022), Chronic diastolic CHF (congestive heart failure), NYHA class 3 (HCC) (01/2016), Chronic lower back pain, CKD (chronic kidney disease), stage III (HCC), COPD (chronic obstructive pulmonary disease) (HCC), Dyspnea, Gout, Headache, Heart murmur, History of herpes zoster virus (01/2023), Hypertension, Hypertensive crisis (08/15/2017), Lipoma (07/06/2022), Neuromuscular disorder (HCC) (02/05/2023), OSA on CPAP, PAD (peripheral artery disease), PAF (paroxysmal atrial fibrillation) (HCC) (01/2016), 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 State he has had stints put in his right leg. Pt states he has been in pain since the surgery. ) .   Discussed the use of AI scribe software for clinical note transcription with the patient, who gave verbal consent to proceed.  History of Present Illness VICK FILTER is a 69 year old male with peripheral vascular disease who presents with right lower extremity pain.  Right lower extremity ischemic  pain - Persistent ischemic pain in the right lower extremity, present even while sitting or lying down - Recent stent placement in the  right lower extremity with ongoing symptoms - Recent CT angiogram revealed multiple areas of mild to moderate stenosis in the right superior femoral artery and severe stenosis at the origin of the right internal iliac artery - Significant discomfort in the feet, particularly at night - Big toe with severe aching, questioning possible gout - Currently taking clopidogrel  75 mg daily and aspirin  81 mg daily - Unable to use hydrocodone  prescribed by the emergency department due to insurance coverage issues - No initiation of an exercise program - Disappointment with lack of post-operative guidance  Left lower extremity vascular disease - Scheduled for a procedure on the left leg next week - CT angiogram showed a two-centimeter stenosis of the left internal iliac artery  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  Abdominal pain - Severe stomach pain initially suspected to be appendicitis - Imaging showed normal appendix     ROS  Past Surgical History:  Procedure Laterality Date   ABDOMINAL AORTOGRAM W/LOWER EXTREMITY N/A 03/14/2024   Procedure: ABDOMINAL AORTOGRAM W/LOWER EXTREMITY;  Surgeon: Magda Debby SAILOR, MD;  Location: [facility] INVASIVE CV LAB;  Service: Cardiovascular;  Laterality: N/A;   BRONCHIAL BIOPSY  06/11/2023   Procedure: BRONCHOSCOPY, WITH BIOPSY;  Surgeon: Shelah Lamar RAMAN, MD;  Location: [facility] ENDOSCOPY;  Service: Pulmonary;;   BRONCHIAL BRUSHINGS  06/11/2023   Procedure: BRONCHOSCOPY, WITH BRUSH BIOPSY;  Surgeon: Shelah Lamar RAMAN, MD;  Location: [facility] ENDOSCOPY;  Service: Pulmonary;;   BRONCHIAL NEEDLE ASPIRATION BIOPSY  06/11/2023   Procedure: BRONCHOSCOPY, WITH NEEDLE ASPIRATION BIOPSY;  Surgeon: Shelah Lamar RAMAN, MD;  Location: [facility] ENDOSCOPY;  Service: Pulmonary;;   BRONCHIAL WASHINGS  06/11/2023    Procedure: IRRIGATION, BRONCHUS;  Surgeon: Shelah Lamar RAMAN, MD;  Location: [facility] ENDOSCOPY;  Service: Pulmonary;;   COLONOSCOPY N/A 04/24/2023   Procedure: COLONOSCOPY;  Surgeon: Rosalie Kitchens, MD;  Location: [facility] ENDOSCOPY;  Service: Gastroenterology;  Laterality: N/A;   IR RADIOLOGIST EVAL & MGMT  05/31/2021   IR RADIOLOGIST EVAL & MGMT  07/26/2021   LEFT HEART CATH AND CORONARY ANGIOGRAPHY N/A 10/23/2016   Procedure: LEFT HEART CATH AND CORONARY ANGIOGRAPHY;  Surgeon: Anner Alm ORN, MD;  Location: [facility] INVASIVE CV LAB;  Service: Cardiovascular;  Laterality: N/A;   LOWER EXTREMITY ANGIOGRAPHY N/A 07/17/2016   Procedure: Lower Extremity Angiography;  Surgeon: Court Dorn PARAS, MD;  Location: [facility] INVASIVE CV LAB;  Service: Cardiovascular;  Laterality: N/A;   LOWER EXTREMITY INTERVENTION N/A 06/05/2016   Procedure: Lower Extremity Intervention;  Surgeon: Dorn PARAS Court, MD;  Location: [facility] INVASIVE CV LAB;  Service: Cardiovascular;  Laterality: N/A;   LOWER EXTREMITY INTERVENTION N/A 03/14/2024   Procedure: LOWER EXTREMITY INTERVENTION;  Surgeon: Magda Debby SAILOR, MD;  Location: [facility] INVASIVE CV LAB;  Service: Cardiovascular;  Laterality: N/A;   PERIPHERAL VASCULAR ATHERECTOMY Right 07/17/2016   Procedure: Peripheral Vascular Atherectomy;  Surgeon: Court Dorn PARAS, MD;  Location: [facility] INVASIVE CV LAB;  Service: Cardiovascular;  Laterality: Right;  SFA   PERIPHERAL VASCULAR INTERVENTION  06/05/2016   Procedure: Peripheral Vascular Intervention;  Surgeon: Dorn PARAS Court, MD;  Location: [facility] INVASIVE CV LAB;  Service: Cardiovascular;;  left SFA   POLYPECTOMY  04/24/2023   Procedure: POLYPECTOMY;  Surgeon: Rosalie Kitchens, MD;  Location: [facility] ENDOSCOPY;  Service: Gastroenterology;;   RADIOLOGY WITH ANESTHESIA Right 06/29/2021   Procedure: RIGHT RENAL CRYOABLATION;  Surgeon: Philip Cornet, MD;  Location: [facility] ORS;  Service: Anesthesiology;  Laterality: Right;   VIDEO BRONCHOSCOPY WITH ENDOBRONCHIAL NAVIGATION Right 06/11/2023  Procedure: VIDEO BRONCHOSCOPY WITH ENDOBRONCHIAL NAVIGATION;  Surgeon: Shelah Lamar RAMAN, MD;  Location: [facility] ENDOSCOPY;  Service: Pulmonary;  Laterality: Right;  WITH FLUORO AND BIOPSY    Outpatient Medications Prior to Visit  Medication Sig Dispense Refill   albuterol  (VENTOLIN  HFA) 108 (90 Base) MCG/ACT inhaler Inhale 1-2 puffs into the lungs every 6 (six) hours as needed for wheezing or shortness of breath. 6.7 g 12   amLODipine -valsartan  (EXFORGE ) 10-320 MG tablet Take 1 tablet by mouth daily. 90 tablet 2   atorvastatin  (LIPITOR) 80 MG tablet Take 1 tablet (80 mg total) by mouth daily. 90 tablet 3   clopidogrel  (PLAVIX ) 75 MG tablet Take 1 tablet (75 mg total) by mouth daily. 30 tablet 11   dolutegravir -lamiVUDine  (DOVATO ) 50-300 MG tablet Take 1 tablet by mouth daily. 30 tablet 5   Fluticasone -Umeclidin-Vilant (TRELEGY ELLIPTA ) 200-62.5-25 MCG/ACT AEPB Inhale 1 puff into the lungs daily. 60 each 2   hydrALAZINE  (APRESOLINE ) 25 MG tablet Take 25 mg by mouth daily.     HYDROcodone -acetaminophen  (NORCO/VICODIN) 5-325 MG tablet Take 2 tablets by mouth every 4 (four) hours as needed. 10 tablet 0   ipratropium-albuterol  (DUONEB) 0.5-2.5 (3) MG/3ML SOLN Take 3 mLs by nebulization every 6 (six) hours as needed. 360 mL 12   montelukast  (SINGULAIR ) 10 MG tablet Take 1 tablet (10 mg total) by mouth at bedtime. 30 tablet 5   nebivolol  (BYSTOLIC ) 5 MG tablet Take 1 tablet (5 mg total) by mouth daily. 90 tablet 3   amitriptyline  (ELAVIL ) 10 MG tablet Take 1 tablet (10 mg total) by mouth at bedtime. 30 tablet 0   aspirin  EC (ASPIRIN  LOW DOSE) 81 MG tablet Take 1 tablet (81 mg total) by mouth daily, swallow whole 30 tablet 12   baclofen (LIORESAL) 10 MG tablet Take 10 mg by mouth 3 (three) times daily. (Patient not taking: No sig reported)     hydrALAZINE  (APRESOLINE ) 50 MG tablet Take 1 tablet (50 mg total) by mouth 2 (two) times daily. (Patient not taking: Reported on 03/06/2024) 180 tablet 3   hydrOXYzine   (ATARAX ) 25 MG tablet Take 25 mg by mouth 4 (four) times daily. (Patient not taking: No sig reported)     oxyCODONE -acetaminophen  (PERCOCET) 10-325 MG tablet Take 1 tablet by mouth 3 (three) times daily. (Patient not taking: No sig reported)     pregabalin  (LYRICA ) 50 MG capsule Take 1 capsule (50 mg total) by mouth 2 (two) times daily. (Patient not taking: No sig reported) 60 capsule 0   temazepam  (RESTORIL ) 15 MG capsule Take 1-2 capsules (15-30 mg total) by mouth at bedtime as needed for sleep. (Patient not taking: Reported on 03/06/2024) 60 capsule 5   Vitamin D , Ergocalciferol , (DRISDOL ) 1.25 MG (50000 UNIT) CAPS capsule Take 1 capsule (50,000 Units total) by mouth once a week for 16 weeks. Then recheck level in the office. (Patient not taking: Reported on 03/06/2024) 12 capsule 0   celecoxib (CELEBREX) 200 MG capsule Take 200 mg by mouth 2 (two) times daily as needed (for pain). (Patient not taking: No sig reported)     ciclopirox  (PENLAC ) 8 % solution Apply topically at bedtime. Apply over nail and surrounding skin. Apply daily over previous coat. After seven (7) days, may remove with alcohol and continue cycle. (Patient not taking: Reported on 03/06/2024) 6.6 mL 2   No facility-administered medications prior to visit.    Family History  Problem Relation Age of Onset   High blood pressure Mother    Lupus Mother  Seizures Daughter    Heart attack Daughter     Social History   Socioeconomic History   Marital status: Significant Other    Spouse name: Not on file   Number of children: Not on file   Years of education: Not on file   Highest education level: Not on file  Occupational History   Not on file  Tobacco Use   Smoking status: Some Days    Current packs/day: 0.10    Average packs/day: 0.1 packs/day for 42.0 years (4.2 ttl pk-yrs)    Types: Cigarettes    Passive exposure: Never   Smokeless tobacco: Never   Tobacco comments:    Has not smoked since May 4th.2025-06/20/2023         Pt smoke 4-5 cigarettes per day  Vaping Use   Vaping status: Former   Substances: Nicotine , Flavoring  Substance and Sexual Activity   Alcohol use: Not Currently    Alcohol/week: 2.0 standard drinks of alcohol    Types: 2 Cans of beer per week    Comment: rare   Drug use: Not Currently    Comment: rare   Sexual activity: Not Currently    Comment: declined condoms  Other Topics Concern   Not on file  Social History Narrative   Right handed   Social Drivers of Health   Tobacco Use: High Risk (03/17/2024)   Patient History    Smoking Tobacco Use: Some Days    Smokeless Tobacco Use: Never    Passive Exposure: Never  Financial Resource Strain: Low Risk (01/21/2024)   Overall Financial Resource Strain (CARDIA)    Difficulty of Paying Living Expenses: Not hard at all  Food Insecurity: No Food Insecurity (01/21/2024)   Epic    Worried About Radiation Protection Practitioner of Food in the Last Year: Never true    Ran Out of Food in the Last Year: Never true  Transportation Needs: No Transportation Needs (01/21/2024)   Epic    Lack of Transportation (Medical): No    Lack of Transportation (Non-Medical): No  Physical Activity: Inactive (01/21/2024)   Exercise Vital Sign    Days of Exercise per Week: 0 days    Minutes of Exercise per Session: 0 min  Stress: No Stress Concern Present (01/21/2024)   [employer] of Occupational Health - Occupational Stress Questionnaire    Feeling of Stress: Not at all  Social Connections: Socially Isolated (01/21/2024)   Social Connection and Isolation Panel    Frequency of Communication with Friends and Family: More than three times a week    Frequency of Social Gatherings with Friends and Family: Three times a week    Attends Religious Services: Never    Active Member of Clubs or Organizations: No    Attends Banker Meetings: Never    Marital Status: Separated  Intimate Partner Violence: Not At Risk (01/21/2024)   Epic    Fear of Current or  Ex-Partner: No    Emotionally Abused: No    Physically Abused: No    Sexually Abused: No  Depression (PHQ2-9): Low Risk (03/19/2024)   Depression (PHQ2-9)    PHQ-2 Score: 0  Alcohol Screen: Low Risk (01/21/2024)   Alcohol Screen    Last Alcohol Screening Score (AUDIT): 0  Housing: Unknown (01/21/2024)   Epic    Unable to Pay for Housing in the Last Year: No    Number of Times Moved in the Last Year: Not on file    Homeless in the Last Year:  No  Utilities: Not At Risk (01/21/2024)   Epic    Threatened with loss of utilities: No  Health Literacy: Adequate Health Literacy (01/21/2024)   B1300 Health Literacy    Frequency of need for help with medical instructions: Never                                                                                                  Objective:  Physical Exam: BP (!) 171/113   Pulse 69   Temp 98.2 F (36.8 C)   Ht 6' (1.829 m)   Wt 152 lb 9.6 oz (69.2 kg)   SpO2 96%   BMI 20.70 kg/m    Physical Exam GENERAL: Alert, cooperative, well developed, no acute distress. HEENT: Normocephalic, normal oropharynx, moist mucous membranes. CHEST: Clear to auscultation bilaterally, no wheezes, rhonchi, or crackles. CARDIOVASCULAR: Normal heart rate and rhythm, S1 and S2 normal without murmurs. ABDOMEN: Soft, non-tender, non-distended, without organomegaly, normal bowel sounds. EXTREMITIES: No cyanosis, edema, or sores on legs. NEUROLOGICAL: Cranial nerves grossly intact, moves all extremities without gross motor or sensory deficit.   Physical Exam  CT Angio Aortobifemoral W and/or Wo Contrast Result Date: 03/17/2024 EXAM: CTA ABDOMEN AND PELVIS WITH AND WITHOUT CONTRAST AND RUNOFF CTA OF THE LOWER EXTREMITIES WITH CONTRAST 03/17/2024 11:36:00 PM TECHNIQUE: CTA images of the abdomen, pelvis and lower extremities with and without intravenous contrast. 100 mL of iohexol  (OMNIPAQUE ) 350 MG/ML injection was administered. Three-dimensional MIP/volume rendered  formations were performed. Automated exposure control, iterative reconstruction, and/or weight based adjustment of the mA/kV was utilized to reduce the radiation dose to as low as reasonably achievable. COMPARISON: CT abdomen and pelvis 05/15/2023. CLINICAL HISTORY: Recent stenting right leg. FINDINGS: VASCULATURE: AORTA: No acute finding. No abdominal aortic aneurysm. No dissection. CELIAC TRUNK: No acute finding. No occlusion or significant stenosis. SUPERIOR MESENTERIC ARTERY: No acute finding. No occlusion or significant stenosis. INFERIOR MESENTERIC ARTERY: No acute finding. No occlusion or significant stenosis. RENAL ARTERIES: No acute finding. No occlusion or significant stenosis. RIGHT ILIAC ARTERIES: Severe stenosis at the origin of the right internal iliac artery with poststenotic dilatation measuring 8 mm. Right common and external iliac arteries are within normal limits. RIGHT FEMORAL ARTERIES: Right superficial femoral artery stent appears patent. There are multiple areas of moderate to mild stenosis throughout the right superficial femoral artery. RIGHT POPLITEAL ARTERY: No acute finding. No occlusion or significant stenosis. RIGHT CALF ARTERIES: The right peroneal artery is patent to the level of the ankle. The right posterior and anterior tibial arteries are not seen. LEFT ILIAC ARTERIES: 2 cm of moderate stenosis in the proximal left internal iliac artery with a single area of severe stenosis just distal to this region. The left common iliac and left external iliac arteries appear within normal limits. LEFT FEMORAL ARTERIES: There are scattered numerous areas of focal mild to severe stenoses throughout the left superficial femoral artery, more prominent proximally. LEFT POPLITEAL ARTERY: No acute finding. No occlusion or significant stenosis. LEFT CALF ARTERIES: The left peroneal artery appears patent to the level of the ankle. The proximal 1 cm of the  anterior left tibial artery appears patent, but  the left anterior tibial artery distal to this level is not opacified. No significant vascular flow identified in the left posterior tibial artery. ABDOMEN AND PELVIS: LOWER CHEST: Visualized portion of the lower chest demonstrates no acute abnormality. LIVER: The liver is unremarkable. GALLBLADDER AND BILE DUCTS: Gallbladder is unremarkable. No biliary ductal dilatation. SPLEEN: The spleen is unremarkable. PANCREAS: The pancreas is unremarkable. ADRENAL GLANDS: Bilateral adrenal glands demonstrate no acute abnormality. KIDNEYS, URETERS AND BLADDER: Urine current and postsurgical changes in the posterior right kidney appear stable. Right inferior pole cyst appears stable. Left inferior pole hyperdense cyst appears stable. No stones in the kidneys or ureters. No hydronephrosis. No evidence of perinephric or periureteral stranding. Urinary bladder is unremarkable. GI AND Bowel: Stomach and duodenal sweep demonstrate no acute abnormality. The appendix is normal. There is no bowel obstruction. No abnormal bowel wall thickening or distension. REPRODUCTIVE: Reproductive organs are unremarkable. PERITONEUM AND RETROPERITONEUM: No ascites or free air. LYMPH NODES: No evidence of lymphadenopathy. BONES AND SOFT TISSUES: Changes of avascular necrosis in the bilateral femoral heads. No acute soft tissue abnormality. IMPRESSION: 1. Right superficial femoral artery stent is patent, with multiple additional areas of mild to moderate stenosis throughout the right superficial femoral artery. 2. Right peroneal artery is patent to the level of the ankle, with nonvisualization of the right anterior and posterior tibial arteries. 3. Severe stenosis at the origin of the right internal iliac artery with poststenotic dilatation measuring 8 mm. 4. Approximately 2 cm of moderate stenosis in the proximal left internal iliac artery with a focal severe stenosis just distal to this. 5. Scattered numerous areas of focal mild to severe stenoses  throughout the left superficial femoral artery, more prominent proximally. 6. Left peroneal artery is patent to the level of the ankle, with only proximal patency of the left anterior tibial artery and nonvisualization distally, and no significant flow identified in the left posterior tibial artery. 7. Avascular necrosis of the bilateral femoral heads. Electronically signed by: Greig Pique MD 03/17/2024 11:58 PM EST RP Workstation: [id number]   PERIPHERAL VASCULAR CATHETERIZATION Result Date: 03/14/2024 DATE OF SERVICE: 03/14/2024  PATIENT:  [name]  69 y.o. male  PRE-OPERATIVE DIAGNOSIS:  Atherosclerosis of native arteries of right lower extremity causing claudication  POST-OPERATIVE DIAGNOSIS:  Same  PROCEDURE:  1) Ultrasound guided left common femoral artery access 2) Aortogram including renal arteries 3) Right lower extremity angiogram with second order cannulation 4) Additional right lower extremity angiogram with third order cannulation 5) Simple right femoropopliteal drug-coated angioplasty (6x267mm INPact) 6) Simply right below knee popliteal, tibioperoneal trunk and peroneal drug-coated angioplasty (4x193mm Ranger) 7) Conscious sedation (44 minutes)   SURGEON:  Debby SAILOR. Magda, MD  ASSISTANT: none  ANESTHESIA:   local and IV sedation  ESTIMATED BLOOD LOSS: min  LOCAL MEDICATIONS USED:  LIDOCAINE   COUNTS: confirmed correct.  PATIENT DISPOSITION:  PACU - hemodynamically stable.  Delay start of Pharmacological VTE agent (>24hrs) due to surgical blood loss or risk of bleeding: no  INDICATION FOR PROCEDURE: RAJIV PARLATO is a 69 y.o. male with peripheral arterial disease with claudication in both legs. He feels his right leg is worse. After careful discussion of risks, benefits, and alternatives the patient was offered angiogram. The patient understood and wished to proceed.  OPERATIVE FINDINGS:  Aortogram (CO2): Renal arteries Left: patent      Right: patent Infrarenal aorta: patent Common iliac  arteries: Left: patent  Right: patent Internal iliac arteries: Left: patent      Right: patent External iliac arteries: Left: patent      Right: patent  Right Lower Extremity Angiogram:             Common femoral artery: patent             Profunda femoris artery: patent             Superficial femoral artery: patent proximally; diffuse disease throughout. Multifocal stenosis - worst 70%.             Popliteal artery: diffuse disease throughout. Multifocal stenosis - worst 70%.             Anterior tibial artery: occluded             Tibioperoneal trunk: tortuous, multifocal stenosis, worst 70%             Peroneal artery: only patent calf vessel; proximal stenosis of 50%             Posterior tibial artery: occluded             Pedal circulation: fills via peroneal collaterals  GLASS score. FP: 4. IP: 1. Stage III.  WIfI score. N/A - no wound.  DESCRIPTION OF PROCEDURE: After identification of the patient in the pre-operative holding area, the patient was transferred to the operating room. The patient was positioned supine on the operating room table. Anesthesia was induced. The groins was prepped and draped in standard fashion. A surgical pause was performed confirming correct patient, procedure, and operative location.  The left groin was anesthetized with subcutaneous injection of 1% lidocaine . Using ultrasound guidance, the right common femoral artery was accessed with micropuncture technique.  Fluoroscopy was used to confirm cannulation over the femoral head. The 42F micropuncture sheath was upsized to 74F.  A Benson wire was advanced into the distal aorta. Over the wire an omni flush catheter was advanced to the level of L2. Aortogram was performed - see above for details.  The right common iliac artery was selected with an omniflush catheter and glidewire guidewire. The wire was advanced into the common femoral artery. Over the wire the omni flush catheter was advanced into the external iliac artery.  Selective angiography was performed - see above for details.  The decision was made to intervene. The patient was heparinized with 10,000 units of heparin . The 74F sheath was exchanged for a 35F x 45cm sheath. Selective angiography of the right lower extremity performed prior to intervention from the popliteal artery. I also took a pressure measurement across the FP lesion showing a pressure differential of >98mmHg.  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 Elected to observe.  A perclose was used to close the arteriotomy. Hemostasis was excellent upon completion.  Conscious sedation was administered with the use of IV fentanyl  and midazolam  under continuous physician and nurse monitoring.  Heart rate, blood pressure, and oxygen saturation were continuously monitored.  Total sedation time was 44 minutes  Upon completion of the case instrument and sharps counts were confirmed correct. The patient was transferred to the PACU in good condition. I was present for all portions of the procedure.  PLAN: ASA / Plavix  / Statin therapy. Follow up with me for LLE angiogram.  Debby SAILOR.  Magda, MD FACS [facility] Phone Number: [phone] 03/14/2024 8:43 AM   VAS US  LOWER EXTREMITY ARTERIAL DUPLEX Result Date: 02/19/2024 LOWER EXTREMITY ARTERIAL DUPLEX STUDY Patient Name:  KAHLE MCQUEEN  Date of Exam:   02/19/2024 Medical Rec #: [id number]        Accession #:    [id number] Date of Birth: 06/01/1955        Patient Gender: M Patient Age:   69 years Exam Location:  [address] Procedure:      VAS US  LOWER EXTREMITY ARTERIAL DUPLEX Referring Phys: DEBBY MAGDA  --------------------------------------------------------------------------------  Indications: Claudication, peripheral artery disease, and Patient complains for              about 2 years of bilateral claudication, left > right. He indicates              after 10 minutes he needs to sit and rest. He denies rest pain but              does get intermittent nocturnal calf cramps. High Risk Factors: Hypertension, hyperlipidemia, current smoker, coronary artery                    disease. Other Factors: History of COPD                 History of OSA                 History of Right lung cancer                 History of CKD stage 3b                 SEE ABI REPORT.  Vascular Interventions: None. Current ABI:            Right .81 Left .72 Comparison Study: None Performing Technologist: Alecia Mackin RVT, RDCS (AE), RDMS  Examination Guidelines: A complete evaluation includes B-mode imaging, spectral Doppler, color Doppler, and power Doppler as needed of all accessible portions of each vessel. Bilateral testing is considered an integral part of a complete examination. Limited examinations for reoccurring indications may be performed as noted.  +-----------+--------+-----+---------------+----------------+------------------+ RIGHT      PSV cm/sRatioStenosis       Waveform        Comments           +-----------+--------+-----+---------------+----------------+------------------+ CFA Prox   81                          triphasic                          +-----------+--------+-----+---------------+----------------+------------------+ DFA        117                         triphasic                          +-----------+--------+-----+---------------+----------------+------------------+ SFA Prox   103                         barely triphasic                   +-----------+--------+-----+---------------+----------------+------------------+ SFA Mid    292          50-74% stenosismultiphasic                         +-----------+--------+-----+---------------+----------------+------------------+  SFA Distal 118                         multiphasic     decreased lumen                                                           size due to                                                               hypoechoic plaque                                                         formation          +-----------+--------+-----+---------------+----------------+------------------+ POP Prox   104                         multiphasic                        +-----------+--------+-----+---------------+----------------+------------------+ POP Distal 38                          monophasic                         +-----------+--------+-----+---------------+----------------+------------------+ TP Trunk   134                         monophasic                         +-----------+--------+-----+---------------+----------------+------------------+ ATA Prox   43                          monophasic                         +-----------+--------+-----+---------------+----------------+------------------+ ATA Mid    37                          monophasic                         +-----------+--------+-----+---------------+----------------+------------------+ ATA Distal 61                          monophasic                         +-----------+--------+-----+---------------+----------------+------------------+ PTA Prox   82                          monophasic                         +-----------+--------+-----+---------------+----------------+------------------+  PTA Mid    207          50-74% stenosismonophasic                         +-----------+--------+-----+---------------+----------------+------------------+ PTA Distal 113                         monophasic                          +-----------+--------+-----+---------------+----------------+------------------+ PERO Prox  223          30-49% stenosismultiphasic                        +-----------+--------+-----+---------------+----------------+------------------+ PERO Mid   73                          monophasic                         +-----------+--------+-----+---------------+----------------+------------------+ PERO Distal81                          monophasic                         +-----------+--------+-----+---------------+----------------+------------------+ A focal velocity elevation of 292 cm/s was obtained at MID SFA with a VR of 2.8. Findings are characteristic of 50-74% stenosis. A 2nd focal velocity elevation was visualized, measuring 207 cm/s at MID PTA with a VR of 2.5. Findings are characteristic of  50-74% stenosis. A 3rd focal velocity elevation was visualized, measuring 223 cm/s at PRX PERON with a VR of 1.7. Findings are characteristic of 30-49% stenosis.  +-----------+--------+-----+---------------+-----------+-----------------------+ LEFT       PSV cm/sRatioStenosis       Waveform   Comments                +-----------+--------+-----+---------------+-----------+-----------------------+ CFA Distal 53                          triphasic                          +-----------+--------+-----+---------------+-----------+-----------------------+ DFA        161                         triphasic                          +-----------+--------+-----+---------------+-----------+-----------------------+ SFA Prox   55                          multiphasicPRX/MID SFA 50-74%                                                        stenosis                +-----------+--------+-----+---------------+-----------+-----------------------+ SFA Mid    354          50-74% stenosismonophasic High end range (tandem  stenoses??)              +-----------+--------+-----+---------------+-----------+-----------------------+ SFA Distal 76                          monophasic                         +-----------+--------+-----+---------------+-----------+-----------------------+ POP Prox   54                          monophasic                         +-----------+--------+-----+---------------+-----------+-----------------------+ POP Distal 42                          monophasic                         +-----------+--------+-----+---------------+-----------+-----------------------+ TP Trunk   48                          monophasic                         +-----------+--------+-----+---------------+-----------+-----------------------+ ATA Prox                               absent     appears occluded        +-----------+--------+-----+---------------+-----------+-----------------------+ ATA Mid    27                          monophasic                         +-----------+--------+-----+---------------+-----------+-----------------------+ ATA Distal 48                          monophasic                         +-----------+--------+-----+---------------+-----------+-----------------------+ PTA Prox   21                          monophasic                         +-----------+--------+-----+---------------+-----------+-----------------------+ PTA Mid    24                          monophasic                         +-----------+--------+-----+---------------+-----------+-----------------------+ PTA Distal 47                          monophasic ? collateral fed        +-----------+--------+-----+---------------+-----------+-----------------------+ PERO Prox  125                         monophasic                         +-----------+--------+-----+---------------+-----------+-----------------------+  PERO Mid   67                          monophasic                          +-----------+--------+-----+---------------+-----------+-----------------------+ PERO Distal77                          monophasic                         +-----------+--------+-----+---------------+-----------+-----------------------+ A focal velocity elevation of 271 cm/s was obtained at PRX/MID SFA with a VR of 4.9. Findings are characteristic of 50-74% stenosis. A 2nd focal velocity elevation was visualized, measuring 354 cm/s at MID SFA with a VR of 6.44. Findings are characteristic of 50-74% stenosis.  Summary: Right: 50-74% stenosis noted in the superficial femoral artery. 50-74% stenosis noted in the posterior tibial artery. 30-49% stenosis noted in the peroneal artery. Left: 50-74% stenosis noted in the superficial femoral artery.  See table(s) above for measurements and observations. Electronically signed by Debby Robertson on 02/19/2024 at 3:49:31 PM.    Final    VAS US  ABI WITH/WO TBI Result Date: 02/19/2024  LOWER EXTREMITY DOPPLER STUDY Patient Name:  JEFRY LESINSKI  Date of Exam:   02/19/2024 Medical Rec #: [id number]        Accession #:    [id number] Date of Birth: [birth year]        Patient Gender: M Patient Age:   3 years Exam Location:  [address] Procedure:      VAS US  ABI WITH/WO TBI Referring Phys: DEBBY ROBERTSON --------------------------------------------------------------------------------  Indications: Claudication, and peripheral artery disease. Patient complains for              about 2 years of bilateral claudication, left > right. He indicates              after 10 minutes he needs to sit and rest. He denies rest pain but              does get intermittent nocturnal calf cramps. High Risk Factors: Hypertension, hyperlipidemia, current smoker, coronary artery                    disease. Other Factors: History of CKD stage 3b                 History of COPD                 History of right lung cancer                 History of OSA                 SEE BILATERAL LEG ARTERIAL DUPLEX  REPORT.  Vascular Interventions: None. Comparison Study: Prior ABI 10/08/2023 right .74 left .70 Performing Technologist: Horton Fairy RVT  Examination Guidelines: A complete evaluation includes at minimum, Doppler waveform signals and systolic blood pressure reading at the level of bilateral brachial, anterior tibial, and posterior tibial arteries, when vessel segments are accessible. Bilateral testing is considered an integral part of a complete examination. Photoelectric Plethysmograph (PPG) waveforms and toe systolic pressure readings are included as required and additional duplex testing as needed. Limited examinations for reoccurring indications may be performed as noted.  ABI Findings: +---------+------------------+-----+----------+--------+ Right    Rt  Pressure (mmHg)IndexWaveform  Comment  +---------+------------------+-----+----------+--------+ Brachial 182                                       +---------+------------------+-----+----------+--------+ PTA      100               0.53 monophasic         +---------+------------------+-----+----------+--------+ PERO     153               0.81 monophasic         +---------+------------------+-----+----------+--------+ DP       137               0.73 monophasic         +---------+------------------+-----+----------+--------+ Great Toe89                0.47 Abnormal           +---------+------------------+-----+----------+--------+ +---------+------------------+-----+----------+-------+ Left     Lt Pressure (mmHg)IndexWaveform  Comment +---------+------------------+-----+----------+-------+ Brachial 188                                      +---------+------------------+-----+----------+-------+ PTA      130               0.69 monophasic        +---------+------------------+-----+----------+-------+ PERO     136               0.75 monophasic        +---------+------------------+-----+----------+-------+  DP       124               0.66 monophasic        +---------+------------------+-----+----------+-------+ Great Toe65                0.35 Abnormal          +---------+------------------+-----+----------+-------+ +-------+-----------+-----------+------------+------------+ ABI/TBIToday's ABIToday's TBIPrevious ABIPrevious TBI +-------+-----------+-----------+------------+------------+ Right  .81        .47        .74         .36          +-------+-----------+-----------+------------+------------+ Left   .72        .35        .70         .38          +-------+-----------+-----------+------------+------------+  Bilateral ABIs appear essentially unchanged compared to prior study on 10/08/2023.  Summary: Right: Resting right ankle-brachial index indicates mild right lower extremity arterial disease. The right toe-brachial index is abnormal.  Left: Resting left ankle-brachial index indicates moderate left lower extremity arterial disease. The left toe-brachial index is abnormal.  *See table(s) above for measurements and observations.  Electronically signed by Debby Robertson on 02/19/2024 at 3:44:13 PM.    Final    CT Angio Chest PE W and/or Wo Contrast Result Date: 02/16/2024 EXAM: CTA CHEST 02/16/2024 08:30:01 AM TECHNIQUE: CTA of the chest was performed after the administration of intravenous contrast. Multiplanar reformatted images are provided for review. MIP images are provided for review. Automated exposure control, iterative reconstruction, and/or weight based adjustment of the mA/kV was utilized to reduce the radiation dose to as low as reasonably achievable. COMPARISON: CT chest 02/04/2024. Clinical history: Non-small cell lung cancer with suspected pulmonary embolism, high probability. CLINICAL HISTORY: Pulmonary embolism (PE) suspected, high  prob. FINDINGS: PULMONARY ARTERIES: Pulmonary arteries are adequately opacified for evaluation. No acute pulmonary embolus. The main pulmonary  artery measures 3 cm. MEDIASTINUM: The heart and pericardium demonstrate no acute abnormality. No pericardial effusion. Aortic atherosclerosis. LYMPH NODES: No mediastinal, hilar or axillary lymphadenopathy. LUNGS AND PLEURA: There is some layering debris or secretions noted within the right mainstem bronchus. Increased bronchial wall thickening is noted bilaterally compared to the previous exam. Focal area of scarring within the posterior right lower lobe as noted previously. Associated nodule in this area measures 7 mm, unchanged from previous exam. Small a ground-glass nodule within the right middle lobe is unchanged, measuring 5 mm, image 83/6. Stable right upper lobe lung nodule measuring 3 mm. No pleural fluid, edema, pneumothorax, or consolidative changes. UPPER ABDOMEN: No acute findings within the imaged portions of the upper abdomen. SOFT TISSUES AND BONES: No acute bone or soft tissue abnormality. IMPRESSION: 1. No evidence of pulmonary embolism. 2. Stable small lung nodules as reported on recent staging exam from 02/04/2024 3. Increased caliber of the main pulmonary artery, which maybe seen in the setting of pulmonary arterial hypertension. 4. Increased bronchial wall thickening noted bilaterally, which may reflect superimposed bronchitis. Electronically signed by: Waddell Calk MD 02/16/2024 08:45 AM EST RP Workstation: [id number]   DG Chest 2 View Result Date: 02/16/2024 EXAM: 2 VIEW(S) XRAY OF THE CHEST 02/16/2024 06:50:08 AM COMPARISON: 09/29/2023 CLINICAL HISTORY: cp FINDINGS: LUNGS AND PLEURA: No focal pulmonary opacity. No pleural effusion. No pneumothorax. HEART AND MEDIASTINUM: No acute abnormality of the cardiac and mediastinal silhouettes. BONES AND SOFT TISSUES: No acute osseous abnormality. IMPRESSION: 1. No acute cardiopulmonary process. Electronically signed by: Evalene Coho MD 02/16/2024 07:10 AM EST RP Workstation: [id number]   CT CHEST WO CONTRAST Result Date: 02/11/2024 EXAM:  CT CHEST WITHOUT CONTRAST 02/04/2024 10:22:00 AM TECHNIQUE: CT of the chest was performed without the administration of intravenous contrast. Multiplanar reformatted images are provided for review. Automated exposure control, iterative reconstruction, and/or weight based adjustment of the mA/kV was utilized to reduce the radiation dose to as low as reasonably achievable. COMPARISON: 10/29/2023 CLINICAL HISTORY: Non-small cell lung cancer (NSCLC), staging. * Tracking Code: BO * FINDINGS: MEDIASTINUM: Heart and pericardium are unremarkable. Mild aortic arch atheromatous vascular calcification. The central airways are clear. LYMPH NODES: No mediastinal, hilar or axillary lymphadenopathy. LUNGS AND PLEURA: Panlike scarring with associated nodularity in the right lower lobe, the nodularity measures up to 6 mm in thickness, previously 7 mm. Airway thickening suggest bronchitis or reactive airways disease. Stable 3 mm right upper lobe nodule on image 45 series 6. Ground glass density 8 x 6 x 6 mm (volume = 200 mm\S\3) nodule likely in the right middle lobe on image 77 series 6 appear stable. No focal consolidation or pulmonary edema. No pleural effusion or pneumothorax. SOFT TISSUES/BONES: No acute abnormality of the bones or soft tissues. UPPER ABDOMEN: Scarring of the right kidney posteriorly. Limited images of the upper abdomen demonstrates no acute abnormality. IMPRESSION: 1. Stable 3 mm right upper lobe nodule and stable 8 x 6 x 6 mm ground-glass nodule in the right middle lobe. 2. Right lower lobe scarring with associated nodularity measuring up to 6 mm in thickness, previously 7 mm. 3. Airway wall thickening, consistent with bronchitis or reactive airways disease. 4. Chronic right renal scarring and mild aortic arch atherosclerotic calcification. Electronically signed by: Ryan Salvage MD 02/11/2024 08:59 AM EST RP Workstation: [id number]    Recent Results (from the past 2160 hours)  CMP (Cancer  Center only)      Status: Abnormal   Collection Time: 02/04/24  8:37 AM  Result Value Ref Range   Sodium 139 135 - 145 mmol/L   Potassium 4.2 3.5 - 5.1 mmol/L   Chloride 107 98 - 111 mmol/L   CO2 23 22 - 32 mmol/L   Glucose, Bld 108 (H) 70 - 99 mg/dL    Comment: Glucose reference range applies only to samples taken after fasting for at least 8 hours.   BUN 22 8 - 23 mg/dL   Creatinine 7.63 (H) 9.38 - 1.24 mg/dL   Calcium  8.6 (L) 8.9 - 10.3 mg/dL   Total Protein 7.2 6.5 - 8.1 g/dL   Albumin  4.2 3.5 - 5.0 g/dL   AST 21 15 - 41 U/L   ALT 11 0 - 44 U/L   Alkaline Phosphatase 74 38 - 126 U/L   Total Bilirubin 0.6 0.0 - 1.2 mg/dL   GFR, Estimated 30 (L) >60 mL/min    Comment: (NOTE) Calculated using the CKD-EPI Creatinine Equation (2021)    Anion gap 9 5 - 15    Comment: Performed at [facility], [address], [place], [place] [address]  CBC with Differential (Cancer Center Only)     Status: Abnormal   Collection Time: 02/04/24  8:37 AM  Result Value Ref Range   WBC Count 8.7 4.0 - 10.5 K/uL   RBC 3.39 (L) 4.22 - 5.81 MIL/uL   Hemoglobin 11.1 (L) 13.0 - 17.0 g/dL   HCT 68.5 (L) 60.9 - 47.9 %   MCV 92.6 80.0 - 100.0 fL   MCH 32.7 26.0 - 34.0 pg   MCHC 35.4 30.0 - 36.0 g/dL   RDW 86.4 88.4 - 84.4 %   Platelet Count 283 150 - 400 K/uL   nRBC 0.0 0.0 - 0.2 %   Neutrophils Relative % 62 %   Neutro Abs 5.4 1.7 - 7.7 K/uL   Lymphocytes Relative 22 %   Lymphs Abs 1.9 0.7 - 4.0 K/uL   Monocytes Relative 7 %   Monocytes Absolute 0.6 0.1 - 1.0 K/uL   Eosinophils Relative 8 %   Eosinophils Absolute 0.7 (H) 0.0 - 0.5 K/uL   Basophils Relative 1 %   Basophils Absolute 0.1 0.0 - 0.1 K/uL   Immature Granulocytes 0 %   Abs Immature Granulocytes 0.03 0.00 - 0.07 K/uL    Comment: Performed at [name], [address] [place], [place] [address]  Basic metabolic panel     Status: Abnormal   Collection Time: 02/16/24  6:37 AM  Result Value Ref Range    Sodium 140 135 - 145 mmol/L   Potassium 4.3 3.5 - 5.1 mmol/L   Chloride 107 98 - 111 mmol/L   CO2 20 (L) 22 - 32 mmol/L   Glucose, Bld 75 70 - 99 mg/dL    Comment: Glucose reference range applies only to samples taken after fasting for at least 8 hours.   BUN 24 (H) 8 - 23 mg/dL   Creatinine, Ser 7.77 (H) 0.61 - 1.24 mg/dL   Calcium  8.9 8.9 - 10.3 mg/dL   GFR, Estimated 32 (L) >60 mL/min    Comment: (NOTE) Calculated using the CKD-EPI Creatinine Equation (2021)    Anion gap 12 5 - 15    Comment: Performed at [facility], [address], [place], [place] [address]  CBC     Status: Abnormal   Collection Time: 02/16/24  6:37 AM  Result Value Ref Range   WBC 7.1 4.0 - 10.5 K/uL   RBC 3.42 (L) 4.22 - 5.81 MIL/uL   Hemoglobin 11.2 (L) 13.0 - 17.0 g/dL   HCT 68.2 (L) 60.9 - 47.9 %   MCV 92.7 80.0 - 100.0 fL   MCH 32.7 26.0 - 34.0 pg   MCHC 35.3 30.0 - 36.0 g/dL   RDW 86.5 88.4 - 84.4 %   Platelets 273 150 - 400 K/uL   nRBC 0.0 0.0 - 0.2 %    Comment: Performed at [facility], [address] [place], [place] [address]  Troponin T, High Sensitivity     Status: Abnormal   Collection Time: 02/16/24  6:37 AM  Result Value Ref Range   Troponin T High Sensitivity 33 (H) 0 - 19 ng/L    Comment: (NOTE) Biotin concentrations > 1000 ng/mL falsely decrease TnT results.  Serial cardiac troponin measurements are suggested.  Refer to the Links section for chest pain algorithms and additional  guidance. Performed at [facility], [address] [place], [place] [address]   Troponin T, High Sensitivity     Status: Abnormal   Collection Time: 02/16/24  8:54 AM  Result Value Ref Range   Troponin T High Sensitivity 31 (H) 0 - 19 ng/L    Comment: (NOTE) Biotin concentrations > 1000 ng/mL falsely decrease TnT results.  Serial cardiac troponin measurements are suggested.  Refer to the Links section for chest pain algorithms and  additional  guidance. Performed at [facility], [address] [place], [place] [address]   Resp panel by RT-PCR (RSV, Flu A&B, Covid) Anterior Nasal Swab     Status: None   Collection Time: 02/16/24 10:09 AM   Specimen: Anterior Nasal Swab  Result Value Ref Range   SARS Coronavirus 2 by RT PCR NEGATIVE NEGATIVE    Comment: (NOTE) SARS-CoV-2 target nucleic acids are NOT DETECTED.  The SARS-CoV-2 RNA is generally detectable in upper respiratory specimens during the acute phase of infection. The lowest concentration of SARS-CoV-2 viral copies this assay can detect is 138 copies/mL. A negative result does not preclude SARS-Cov-2 infection and should not be used as the sole basis for treatment or other patient management decisions. A negative result may occur with  improper specimen collection/handling, submission of specimen other than nasopharyngeal swab, presence of viral mutation(s) within the areas targeted by this assay, and inadequate number of viral copies(<138 copies/mL). A negative result must be combined with clinical observations, patient history, and epidemiological information. The expected result is Negative.  Fact Sheet for Patients:  bloggercourse.com  Fact Sheet for Healthcare Providers:  seriousbroker.it  This test is no t yet approved or cleared by the [place]  FDA and  has been authorized for detection and/or diagnosis of SARS-CoV-2 by FDA under an Emergency Use Authorization (EUA). This EUA will remain  in effect (meaning this test can be used) for the duration of the COVID-19 declaration under Section 564(b)(1) of the Act, 21 U.S.C.section 360bbb-3(b)(1), unless the authorization is terminated  or revoked sooner.       Influenza A by PCR NEGATIVE NEGATIVE   Influenza B by PCR NEGATIVE NEGATIVE    Comment: (NOTE) The Xpert Xpress SARS-CoV-2/FLU/RSV plus assay is intended as an  aid in the diagnosis of influenza from Nasopharyngeal swab specimens and should not be used as a sole basis for treatment. Nasal washings and aspirates are unacceptable for Xpert Xpress SARS-CoV-2/FLU/RSV testing.  Fact Sheet  for Patients: bloggercourse.com  Fact Sheet for Healthcare Providers: seriousbroker.it  This test is not yet approved or cleared by the [place]  FDA and has been authorized for detection and/or diagnosis of SARS-CoV-2 by FDA under an Emergency Use Authorization (EUA). This EUA will remain in effect (meaning this test can be used) for the duration of the COVID-19 declaration under Section 564(b)(1) of the Act, 21 U.S.C. section 360bbb-3(b)(1), unless the authorization is terminated or revoked.     Resp Syncytial Virus by PCR NEGATIVE NEGATIVE    Comment: (NOTE) Fact Sheet for Patients: bloggercourse.com  Fact Sheet for Healthcare Providers: seriousbroker.it  This test is not yet approved or cleared by the [place]  FDA and has been authorized for detection and/or diagnosis of SARS-CoV-2 by FDA under an Emergency Use Authorization (EUA). This EUA will remain in effect (meaning this test can be used) for the duration of the COVID-19 declaration under Section 564(b)(1) of the Act, 21 U.S.C. section 360bbb-3(b)(1), unless the authorization is terminated or revoked.  Performed at [facility], [address], [place], [place] [address]   VAS US  ABI WITH/WO TBI     Status: None   Collection Time: 02/19/24  3:49 PM  Result Value Ref Range   Right ABI .81    Left ABI .72   I-STAT, chem 8     Status: Abnormal   Collection Time: 03/14/24  6:18 AM  Result Value Ref Range   Sodium 143 135 - 145 mmol/L   Potassium 4.1 3.5 - 5.1 mmol/L   Chloride 109 98 - 111 mmol/L   BUN 21 8 - 23 mg/dL   Creatinine, Ser 7.69 (H) 0.61 - 1.24 mg/dL    Glucose, Bld 87 70 - 99 mg/dL    Comment: Glucose reference range applies only to samples taken after fasting for at least 8 hours.   Calcium , Ion 1.24 1.15 - 1.40 mmol/L   TCO2 23 22 - 32 mmol/L   Hemoglobin 11.2 (L) 13.0 - 17.0 g/dL   HCT 66.9 (L) 60.9 - 47.9 %  POCT Activated clotting time     Status: None   Collection Time: 03/14/24  8:28 AM  Result Value Ref Range   Activated Clotting Time 256 seconds    Comment: Reference range 74-137 seconds for patients not on anticoagulant therapy.  Basic metabolic panel     Status: Abnormal   Collection Time: 03/17/24  8:55 PM  Result Value Ref Range   Sodium 139 135 - 145 mmol/L   Potassium 4.7 3.5 - 5.1 mmol/L   Chloride 105 98 - 111 mmol/L   CO2 23 22 - 32 mmol/L   Glucose, Bld 80 70 - 99 mg/dL    Comment: Glucose reference range applies only to samples taken after fasting for at least 8 hours.   BUN 23 8 - 23 mg/dL   Creatinine, Ser 7.67 (H) 0.61 - 1.24 mg/dL   Calcium  9.0 8.9 - 10.3 mg/dL   GFR, Estimated 30 (L) >60 mL/min    Comment: (NOTE) Calculated using the CKD-EPI Creatinine Equation (2021)    Anion gap 11 5 - 15    Comment: Performed at [facility], [address] [place], [place] [address]  CBC     Status: Abnormal   Collection Time: 03/17/24  8:55 PM  Result Value Ref Range   WBC 8.4 4.0 - 10.5 K/uL   RBC 3.71 (L) 4.22 - 5.81 MIL/uL   Hemoglobin 11.9 (L) 13.0 - 17.0  g/dL   HCT 64.5 (L) 60.9 - 47.9 %   MCV 95.4 80.0 - 100.0 fL   MCH 32.1 26.0 - 34.0 pg   MCHC 33.6 30.0 - 36.0 g/dL   RDW 86.4 88.4 - 84.4 %   Platelets 321 150 - 400 K/uL   nRBC 0.0 0.0 - 0.2 %    Comment: Performed at [facility], [address], [place], [place] [address]        Beverley Adine Hummer, MD, MS   I,Emily Lagle,acting as a scribe for Beverley KATHEE Hummer, MD.,have documented all relevant documentation on the behalf of Beverley KATHEE Hummer, MD.  LILLETTE Beverley KATHEE Hummer, MD, have reviewed all documentation for this visit. The  documentation on 03/19/2024 for the exam, diagnosis, procedures, and orders are all accurate and complete. "

## 2024-03-20 ENCOUNTER — Other Ambulatory Visit: Payer: Self-pay

## 2024-03-28 ENCOUNTER — Encounter (HOSPITAL_COMMUNITY): Payer: Self-pay

## 2024-03-28 ENCOUNTER — Ambulatory Visit (HOSPITAL_COMMUNITY): Admit: 2024-03-28 | Admitting: Vascular Surgery

## 2024-04-24 ENCOUNTER — Encounter (HOSPITAL_BASED_OUTPATIENT_CLINIC_OR_DEPARTMENT_OTHER): Admitting: Family

## 2024-05-05 ENCOUNTER — Ambulatory Visit: Admitting: Podiatry

## 2024-05-06 ENCOUNTER — Ambulatory Visit: Admitting: Infectious Disease

## 2024-08-04 ENCOUNTER — Inpatient Hospital Stay

## 2024-08-11 ENCOUNTER — Inpatient Hospital Stay: Admitting: Internal Medicine

## 2024-08-12 ENCOUNTER — Encounter

## 2025-01-27 ENCOUNTER — Ambulatory Visit
